# Patient Record
Sex: Male | Born: 1935 | Race: White | Hispanic: No | Marital: Married | Smoking: Former smoker
Health system: Southern US, Community
[De-identification: ages and names within clinical notes are randomized; demographics above are authoritative.]

## PROBLEM LIST (undated history)

## (undated) DIAGNOSIS — I714 Abdominal aortic aneurysm, without rupture, unspecified: Secondary | ICD-10-CM

## (undated) DIAGNOSIS — M1990 Unspecified osteoarthritis, unspecified site: Secondary | ICD-10-CM

## (undated) DIAGNOSIS — G43909 Migraine, unspecified, not intractable, without status migrainosus: Secondary | ICD-10-CM

## (undated) DIAGNOSIS — G629 Polyneuropathy, unspecified: Secondary | ICD-10-CM

## (undated) DIAGNOSIS — M353 Polymyalgia rheumatica: Secondary | ICD-10-CM

## (undated) DIAGNOSIS — K5792 Diverticulitis of intestine, part unspecified, without perforation or abscess without bleeding: Secondary | ICD-10-CM

## (undated) DIAGNOSIS — D509 Iron deficiency anemia, unspecified: Secondary | ICD-10-CM

## (undated) DIAGNOSIS — K922 Gastrointestinal hemorrhage, unspecified: Secondary | ICD-10-CM

## (undated) DIAGNOSIS — G459 Transient cerebral ischemic attack, unspecified: Secondary | ICD-10-CM

## (undated) DIAGNOSIS — K279 Peptic ulcer, site unspecified, unspecified as acute or chronic, without hemorrhage or perforation: Secondary | ICD-10-CM

## (undated) DIAGNOSIS — G8929 Other chronic pain: Secondary | ICD-10-CM

## (undated) DIAGNOSIS — E041 Nontoxic single thyroid nodule: Secondary | ICD-10-CM

## (undated) DIAGNOSIS — C61 Malignant neoplasm of prostate: Secondary | ICD-10-CM

## (undated) DIAGNOSIS — R55 Syncope and collapse: Secondary | ICD-10-CM

## (undated) DIAGNOSIS — I10 Essential (primary) hypertension: Secondary | ICD-10-CM

## (undated) DIAGNOSIS — F329 Major depressive disorder, single episode, unspecified: Secondary | ICD-10-CM

## (undated) DIAGNOSIS — R519 Headache, unspecified: Secondary | ICD-10-CM

## (undated) DIAGNOSIS — F32A Depression, unspecified: Secondary | ICD-10-CM

## (undated) DIAGNOSIS — I639 Cerebral infarction, unspecified: Secondary | ICD-10-CM

## (undated) DIAGNOSIS — E7800 Pure hypercholesterolemia, unspecified: Secondary | ICD-10-CM

## (undated) DIAGNOSIS — R51 Headache: Secondary | ICD-10-CM

## (undated) DIAGNOSIS — N189 Chronic kidney disease, unspecified: Secondary | ICD-10-CM

## (undated) DIAGNOSIS — E119 Type 2 diabetes mellitus without complications: Secondary | ICD-10-CM

## (undated) DIAGNOSIS — I951 Orthostatic hypotension: Secondary | ICD-10-CM

## (undated) HISTORY — PX: BACK SURGERY: SHX140

## (undated) HISTORY — DX: Chronic kidney disease, unspecified: N18.9

## (undated) HISTORY — PX: TONSILLECTOMY: SUR1361

## (undated) HISTORY — DX: Orthostatic hypotension: I95.1

## (undated) HISTORY — PX: HAND SURGERY: SHX662

## (undated) HISTORY — DX: Polyneuropathy, unspecified: G62.9

## (undated) HISTORY — PX: CRYOTHERAPY: SHX1416

## (undated) HISTORY — PX: TRIGGER FINGER RELEASE: SHX641

## (undated) HISTORY — PX: MENISCUS REPAIR: SHX5179

## (undated) HISTORY — PX: EYE SURGERY: SHX253

## (undated) HISTORY — DX: Essential (primary) hypertension: I10

## (undated) HISTORY — PX: HERNIA REPAIR: SHX51

## (undated) HISTORY — PX: KNEE DEBRIDEMENT: SHX1894

## (undated) HISTORY — PX: SPINAL FUSION: SHX223

## (undated) HISTORY — PX: SHOULDER OPEN ROTATOR CUFF REPAIR: SHX2407

## (undated) HISTORY — PX: APPENDECTOMY: SHX54

## (undated) HISTORY — DX: Type 2 diabetes mellitus without complications: E11.9

## (undated) HISTORY — PX: FEMUR SURGERY: SHX943

## (undated) HISTORY — PX: COLON SURGERY: SHX602

## (undated) HISTORY — DX: Polymyalgia rheumatica: M35.3

## (undated) HISTORY — DX: Migraine, unspecified, not intractable, without status migrainosus: G43.909

## (undated) HISTORY — PX: APPENDECTOMY (OPEN): SHX54

## (undated) HISTORY — DX: Unspecified osteoarthritis, unspecified site: M19.90

## (undated) HISTORY — PX: ABDOMINAL SURGERY: SHX537

## (undated) HISTORY — PX: FRACTURE SURGERY: SHX138

## (undated) HISTORY — DX: Abdominal aortic aneurysm, without rupture, unspecified: I71.40

## (undated) HISTORY — DX: Abdominal aortic aneurysm, without rupture: I71.4

## (undated) HISTORY — PX: ANTERIOR FUSION CERVICAL SPINE: SUR626

---

## 2002-12-01 HISTORY — PX: UVULECTOMY: SHX2631

## 2004-01-03 ENCOUNTER — Ambulatory Visit (HOSPITAL_COMMUNITY): Admission: RE | Admit: 2004-01-03 | Discharge: 2004-01-03 | Payer: Self-pay | Admitting: Internal Medicine

## 2006-09-29 ENCOUNTER — Ambulatory Visit: Payer: Self-pay | Admitting: Internal Medicine

## 2006-09-30 ENCOUNTER — Ambulatory Visit: Payer: Self-pay | Admitting: Internal Medicine

## 2011-05-31 DIAGNOSIS — R51 Headache: Secondary | ICD-10-CM | POA: Insufficient documentation

## 2011-05-31 DIAGNOSIS — E7800 Pure hypercholesterolemia, unspecified: Secondary | ICD-10-CM | POA: Insufficient documentation

## 2011-05-31 DIAGNOSIS — M545 Low back pain, unspecified: Secondary | ICD-10-CM | POA: Insufficient documentation

## 2011-05-31 DIAGNOSIS — N400 Enlarged prostate without lower urinary tract symptoms: Secondary | ICD-10-CM | POA: Insufficient documentation

## 2011-05-31 DIAGNOSIS — K273 Acute peptic ulcer, site unspecified, without hemorrhage or perforation: Secondary | ICD-10-CM | POA: Insufficient documentation

## 2011-05-31 DIAGNOSIS — R519 Headache, unspecified: Secondary | ICD-10-CM | POA: Insufficient documentation

## 2011-05-31 DIAGNOSIS — I10 Essential (primary) hypertension: Secondary | ICD-10-CM | POA: Insufficient documentation

## 2011-05-31 DIAGNOSIS — E119 Type 2 diabetes mellitus without complications: Secondary | ICD-10-CM | POA: Insufficient documentation

## 2011-05-31 DIAGNOSIS — D126 Benign neoplasm of colon, unspecified: Secondary | ICD-10-CM | POA: Insufficient documentation

## 2011-08-12 DIAGNOSIS — E291 Testicular hypofunction: Secondary | ICD-10-CM | POA: Insufficient documentation

## 2012-01-29 DIAGNOSIS — E042 Nontoxic multinodular goiter: Secondary | ICD-10-CM | POA: Insufficient documentation

## 2012-06-18 DIAGNOSIS — H9190 Unspecified hearing loss, unspecified ear: Secondary | ICD-10-CM | POA: Insufficient documentation

## 2012-06-18 DIAGNOSIS — K5792 Diverticulitis of intestine, part unspecified, without perforation or abscess without bleeding: Secondary | ICD-10-CM | POA: Insufficient documentation

## 2012-06-18 DIAGNOSIS — E079 Disorder of thyroid, unspecified: Secondary | ICD-10-CM | POA: Insufficient documentation

## 2012-06-18 DIAGNOSIS — K589 Irritable bowel syndrome without diarrhea: Secondary | ICD-10-CM | POA: Insufficient documentation

## 2012-06-18 DIAGNOSIS — K922 Gastrointestinal hemorrhage, unspecified: Secondary | ICD-10-CM

## 2012-06-18 DIAGNOSIS — M1990 Unspecified osteoarthritis, unspecified site: Secondary | ICD-10-CM | POA: Insufficient documentation

## 2012-06-18 DIAGNOSIS — F419 Anxiety disorder, unspecified: Secondary | ICD-10-CM | POA: Insufficient documentation

## 2012-06-18 DIAGNOSIS — F329 Major depressive disorder, single episode, unspecified: Secondary | ICD-10-CM | POA: Insufficient documentation

## 2012-06-18 DIAGNOSIS — F339 Major depressive disorder, recurrent, unspecified: Secondary | ICD-10-CM | POA: Insufficient documentation

## 2012-06-18 HISTORY — DX: Gastrointestinal hemorrhage, unspecified: K92.2

## 2012-11-09 DIAGNOSIS — R011 Cardiac murmur, unspecified: Secondary | ICD-10-CM | POA: Insufficient documentation

## 2012-11-09 DIAGNOSIS — R55 Syncope and collapse: Secondary | ICD-10-CM | POA: Insufficient documentation

## 2012-12-01 DIAGNOSIS — Z9289 Personal history of other medical treatment: Secondary | ICD-10-CM

## 2012-12-01 HISTORY — PX: ARTHROSCOPY KNEE, MENISCUS REPAIR: SHX51041

## 2012-12-01 HISTORY — DX: Personal history of other medical treatment: Z92.89

## 2013-01-11 DIAGNOSIS — R55 Syncope and collapse: Secondary | ICD-10-CM | POA: Insufficient documentation

## 2013-02-24 DIAGNOSIS — H269 Unspecified cataract: Secondary | ICD-10-CM | POA: Insufficient documentation

## 2013-02-24 DIAGNOSIS — Z961 Presence of intraocular lens: Secondary | ICD-10-CM | POA: Insufficient documentation

## 2013-03-21 ENCOUNTER — Ambulatory Visit (INDEPENDENT_AMBULATORY_CARE_PROVIDER_SITE_OTHER)
Admission: RE | Admit: 2013-03-21 | Disposition: A | Discharge status: Home / Self Care | Payer: Self-pay | Source: Ambulatory Visit | Attending: Family Medicine | Admitting: Family Medicine

## 2013-04-05 DIAGNOSIS — J069 Acute upper respiratory infection, unspecified: Secondary | ICD-10-CM | POA: Insufficient documentation

## 2013-05-13 ENCOUNTER — Emergency Department
Admission: EM | Admit: 2013-05-13 | Discharge: 2013-05-13 | Disposition: A | Discharge status: Home / Self Care | Payer: Medicare Other | Attending: Emergency Medicine | Admitting: Emergency Medicine

## 2013-05-13 ENCOUNTER — Emergency Department: Payer: Medicare Other

## 2013-05-13 LAB — CBC AND DIFFERENTIAL
Basophils %: 0.6 % (ref 0.0–3.0)
Basophils Absolute: 0 10*3/uL (ref 0.0–0.3)
Eosinophils %: 3.2 % (ref 0.0–7.0)
Eosinophils Absolute: 0.3 10*3/uL (ref 0.0–0.8)
Hematocrit: 41.1 % (ref 39.0–52.5)
Hemoglobin: 14.1 gm/dL (ref 13.0–17.5)
Lymphocytes Absolute: 1.6 10*3/uL (ref 0.6–5.1)
Lymphocytes: 20.4 % (ref 15.0–46.0)
MCH: 33 pg (ref 28–35)
MCHC: 34 gm/dL (ref 32–36)
MCV: 96 fL (ref 80–100)
MPV: 7.5 fL (ref 6.0–10.0)
Monocytes Absolute: 0.7 10*3/uL (ref 0.1–1.7)
Monocytes: 9.3 % (ref 3.0–15.0)
Neutrophils %: 66.6 % (ref 42.0–78.0)
Neutrophils Absolute: 5.3 10*3/uL (ref 1.7–8.6)
PLT CT: 249 10*3/uL (ref 130–440)
RBC: 4.27 10*6/uL (ref 4.00–5.70)
RDW: 11.5 % (ref 11.0–14.0)
WBC: 7.9 10*3/uL (ref 4.0–11.0)

## 2013-05-13 LAB — I-STAT CHEM 8 CARTRIDGE
Anion Gap I-Stat: 14 (ref 7–16)
BUN I-Stat: 12 mg/dL (ref 6–20)
Calcium Ionized I-Stat: 4.9 mg/dL (ref 4.35–5.10)
Chloride I-Stat: 102 mMol/L (ref 98–112)
Creatinine I-Stat: 1.1 mg/dL (ref 0.90–1.30)
EGFR: >60 mL/min/{1.73_m2}
Glucose I-Stat: 137 mg/dL — ABNORMAL HIGH (ref 70–99)
Hematocrit I-Stat: 39 % (ref 39.0–52.5)
Hemoglobin I-Stat: 13.3 gm/dL (ref 13.0–17.5)
Potassium I-Stat: 4.3 mMol/L (ref 3.5–5.3)
Sodium I-Stat: 141 mMol/L (ref 135–145)
TCO2 I-Stat: 31 mMol/L — ABNORMAL HIGH (ref 24–29)

## 2013-05-13 LAB — VH I-STAT CKMB: CKMB, ISTAT: 2.4 ng/mL (ref 0.2–6.0)

## 2013-05-13 LAB — I-STAT TROPONIN: Troponin I I-Stat: <0.02 ng/mL (ref 0.00–0.02)

## 2013-05-13 LAB — VH I-STAT CHEM 8 PLUS NOTIFICATION

## 2013-05-13 LAB — VH I-STAT BNP: BNP, ISTAT: 45 pg/mL (ref 0–100)

## 2013-05-13 LAB — VH I-STAT CKMB NOTIFICATION

## 2013-05-13 LAB — VH I-STAT TROPONIN NOTIFICATION

## 2013-05-13 LAB — VH I-STAT BNP NOTIFICATION - HLAB

## 2013-05-13 MED ORDER — ONDANSETRON HCL 4 MG/2ML IJ SOLN
4.0000 mg | Freq: Once | INTRAMUSCULAR | Status: AC
Start: 2013-05-13 — End: 2013-05-13
  Administered 2013-05-13: 4 mg via INTRAVENOUS

## 2013-05-13 MED ORDER — ONDANSETRON HCL 4 MG PO TABS
4.0000 mg | ORAL_TABLET | Freq: Four times a day (QID) | ORAL | Status: DC | PRN
Start: 2013-05-13 — End: 2014-07-04

## 2013-05-13 MED ORDER — VH HYDROMORPHONE HCL PF 1 MG/ML CARPUJECT
0.5000 mg | Freq: Once | INTRAMUSCULAR | Status: AC
Start: 2013-05-13 — End: 2013-05-13
  Administered 2013-05-13: 0.5 mg via INTRAVENOUS

## 2013-05-13 MED ORDER — HYDROCODONE-ACETAMINOPHEN 5-325 MG PO TABS
1.0000 | ORAL_TABLET | Freq: Four times a day (QID) | ORAL | Status: DC | PRN
Start: 2013-05-13 — End: 2014-07-04

## 2013-05-13 MED ORDER — IOHEXOL 240 MG/ML IJ SOLN
50.00 mL | Freq: Once | INTRAMUSCULAR | Status: AC
Start: 2013-05-13 — End: 2013-05-13
  Administered 2013-05-13: 50 mL via ORAL

## 2013-05-13 MED ORDER — VH HYDROMORPHONE HCL 1 MG/ML (NARRATOR)
INTRAMUSCULAR | Status: AC
Start: 2013-05-13 — End: ?
  Filled 2013-05-13: qty 1

## 2013-05-13 MED ORDER — ONDANSETRON HCL 4 MG/2ML IJ SOLN
INTRAMUSCULAR | Status: AC
Start: 2013-05-13 — End: ?
  Filled 2013-05-13: qty 2

## 2013-05-13 NOTE — ED Provider Notes (Addendum)
Physician/Midlevel provider first contact with patient: 05/13/13 1250         History     Chief Complaint   Patient presents with   . Chest Injury     Patient is a 79 y.o. male presenting with chest pain. The history is provided by the patient.   Chest Pain  The chest pain began 1 - 2 weeks ago. Chest pain occurs constantly. The chest pain is worsening. The pain is associated with breathing. The severity of the pain is mild. Chest pain is worsened by deep breathing. Pertinent negatives for primary symptoms include no cough, no wheezing, no palpitations, no nausea and no vomiting.   Associated symptoms include lower extremity edema.   Pertinent negatives for associated symptoms include no near-syncope.         Past Medical History   Diagnosis Date   . Neuropathy        Past Surgical History   Procedure Date   . Back surgery    . Abdominal surgery    . Appendectomy    . Fracture surgery    . Tonsillectomy    . Hernia repair        History reviewed. No pertinent family history.    Social  History   Substance Use Topics   . Smoking status: Never Smoker    . Smokeless tobacco: Never Used   . Alcohol Use: No       .     Allergies   Allergen Reactions   . Iodine        Current/Home Medications    DIAZEPAM (VALIUM) 5 MG TABLET    Take 5 mg by mouth every 12 (twelve) hours as needed.    ESCITALOPRAM (LEXAPRO) 5 MG TABLET    Take 5 mg by mouth nightly. Take 2 at night for anxiety    HYDROCODONE-ACETAMINOPHEN (NORCO) 5-325 MG PER TABLET    Take 1 tablet by mouth every 6 (six) hours as needed.    PREGABALIN (LYRICA) 75 MG CAPSULE    Take 75 mg by mouth 2 (two) times daily.        Review of Systems   Respiratory: Negative for cough and wheezing.    Cardiovascular: Positive for chest pain and leg swelling (feet swelling). Negative for palpitations and near-syncope.   Gastrointestinal: Negative for nausea and vomiting.       Physical Exam    BP 154/75  Pulse 87  Temp 98.8 F (37.1 C)  Resp 20  Ht 1.88 m  Wt 103.5 kg  BMI  29.28 kg/m2  SpO2 97%    Physical Exam   Nursing note and vitals reviewed.  Constitutional: He is oriented to person, place, and time. He appears well-developed and well-nourished. He appears distressed.   HENT:   Head: Normocephalic and atraumatic.   Mouth/Throat: Oropharynx is clear and moist.   Eyes: Conjunctivae normal are normal. Pupils are equal, round, and reactive to light. Right eye exhibits no discharge. Left eye exhibits no discharge.   Neck: Normal range of motion. Neck supple.   Cardiovascular: Normal rate, regular rhythm and normal heart sounds.    Pulmonary/Chest: Effort normal and breath sounds normal. No respiratory distress.   Abdominal: Soft. Bowel sounds are normal. He exhibits no distension. There is no tenderness.   Musculoskeletal: Normal range of motion. He exhibits no edema and no tenderness.   Neurological: He is alert and oriented to person, place, and time.   Skin: Skin is warm and  dry. No rash noted. He is not diaphoretic.   Psychiatric: He has a normal mood and affect.       MDM and ED Course     ED Medication Orders      Start     Status Ordering Provider    05/13/13 1448   iohexol (OMNIPAQUE) 240 MG/ML IV/ORAL solution 50 mL   Once in ED      Route: Oral  Ordered Dose: 50 mL         Last MAR action:  Given Gurney Balthazor J    05/13/13 1414   HYDROmorphone (DILAUDID) injection 0.5 mg   Once in ED      Route: Intravenous  Ordered Dose: 0.5 mg         Last MAR action:  Given Siyona Coto J    05/13/13 1414   ondansetron (ZOFRAN) injection 4 mg   Once in ED      Route: Intravenous  Ordered Dose: 4 mg         Last MAR action:  Given Ples Trudel J                 MDM    Chest wall pain, pneumonia, PE     Procedures    Clinical Impression & Disposition     Clinical Impression  Final diagnoses:   Chest wall pain        ED Disposition     Discharge Donato Heinz discharge to home/self care.    Condition at discharge: Good             New Prescriptions     HYDROCODONE-ACETAMINOPHEN (NORCO) 5-325 MG PER TABLET    Take 1 tablet by mouth every 6 (six) hours as needed for Pain.    ONDANSETRON (ZOFRAN) 4 MG TABLET    Take 1 tablet (4 mg total) by mouth every 6 (six) hours as needed for Nausea.               Donalynn Furlong, MD  05/13/13 1635    Donalynn Furlong, MD  05/26/13 Herminio Commons    Donalynn Furlong, MD  05/26/13 Paulo Fruit

## 2013-05-13 NOTE — Discharge Instructions (Signed)
Chest Pain, Noncardiac    Based on your visit today, the exact cause of your chest pain is not certain. Your condition does not seem serious and your pain does not appear to be coming from your heart. However, sometimes the signs of a serious problem take more time to appear. Therefore, please watch for the warning signs listed below.  Home Care:  1. Rest today and avoid strenuous activity.  2. Take any prescribed medicine as directed.  Follow Up  with your doctor or this facility as instructed or if you do not start to feel better within 24 hours.  Get Prompt Medical Attention  if any of the following occur:   A change in the type of pain: if it feels different, becomes more severe, lasts longer, or begins to spread into your shoulder, arm, neck, jaw or back   Shortness of breath or increased pain with breathing   Cough with dark colored sputum (phlegm) or blood   Weakness, dizziness, or fainting   Fever of 100.4F (38C) or higher, or as directed by your healthcare provider   Swelling, pain or redness in one leg   2000-2014 [employer], [address], [place] [address]. All rights reserved. This information is not intended as a substitute for professional medical care. Always follow your healthcare professional's instructions.

## 2013-05-13 NOTE — ED Notes (Signed)
Fall approximately 2 weeks prior to todays visit.  States positive loc for unknown time, fall from ladder 6-8 feet, states continues with left side pain with palpation and touch, ecchymosis noted and scattered over anterior and posterior trunk, bilateral lower extremities.

## 2013-05-17 ENCOUNTER — Emergency Department: Payer: Medicare Other

## 2013-05-17 ENCOUNTER — Emergency Department
Admission: EM | Admit: 2013-05-17 | Discharge: 2013-05-17 | Disposition: A | Discharge status: Home / Self Care | Payer: Medicare Other | Attending: Emergency Medicine | Admitting: Emergency Medicine

## 2013-05-17 DIAGNOSIS — M7989 Other specified soft tissue disorders: Secondary | ICD-10-CM | POA: Insufficient documentation

## 2013-05-17 LAB — ECG 12-LEAD
P Wave Axis: -73 deg
P Wave Duration: 94 ms
P-R Interval: 142 ms
Patient Age: 82 years
Q-T Dispersion: 58 ms
Q-T Interval(Corrected): 444 ms
Q-T Interval: 378 ms
QRS Axis: -55 deg
QRS Duration: 84 ms
T Axis: 9 deg
Ventricular Rate: 83 /min

## 2013-05-17 LAB — BASIC METABOLIC PANEL
Anion Gap: 14.2 mMol/L (ref 7.0–18.0)
BUN / Creatinine Ratio: 13.7 Ratio (ref 10.0–30.0)
BUN: 16 mg/dL (ref 7–22)
CO2: 28.3 mMol/L (ref 20.0–30.0)
Calcium: 9.1 mg/dL (ref 8.5–10.5)
Chloride: 103 mMol/L (ref 98–110)
Creatinine: 1.17 mg/dL (ref 0.80–1.30)
EGFR: 60 mL/min/{1.73_m2}
Glucose: 237 mg/dL — ABNORMAL HIGH (ref 70–99)
Osmolality Calc: 290 mOsm/kg (ref 275–300)
Potassium: 4.5 mMol/L (ref 3.5–5.3)
Sodium: 141 mMol/L (ref 136–147)

## 2013-05-17 LAB — URINALYSIS WITH CULTURE REFLEX
Bilirubin, UA: NEGATIVE mg/dL
Ketones UA: NEGATIVE mg/dL
Leukocyte Esterase, UA: NEGATIVE Leu/uL
Nitrite, UA: NEGATIVE
RBC, UA: 2 /hpf (ref 0–4)
Urine Specific Gravity: 1.022 (ref 1.001–1.040)
Urobilinogen, UA: NORMAL mg/dL
WBC, UA: 1 /hpf (ref 0–4)
pH, Urine: 6.5 pH (ref 5.0–8.0)

## 2013-05-17 LAB — HEPATIC FUNCTION PANEL
ALT: 20 U/L (ref 0–55)
AST (SGOT): 23 U/L (ref 10–42)
Albumin/Globulin Ratio: 0.89 Ratio (ref 0.70–1.50)
Albumin: 3.4 gm/dL — ABNORMAL LOW (ref 3.5–5.0)
Alkaline Phosphatase: 99 U/L (ref 40–145)
Bilirubin Direct: 0.1 mg/dL (ref 0.0–0.3)
Bilirubin, Total: 0.4 mg/dL (ref 0.1–1.2)
Globulin: 3.8 gm/dL (ref 2.0–4.0)
Protein, Total: 7.2 gm/dL (ref 6.0–8.3)

## 2013-05-17 LAB — CBC AND DIFFERENTIAL
Basophils %: 0.2 % (ref 0.0–3.0)
Basophils Absolute: 0 10*3/uL (ref 0.0–0.3)
Eosinophils %: 3.7 % (ref 0.0–7.0)
Eosinophils Absolute: 0.3 10*3/uL (ref 0.0–0.8)
Hematocrit: 42.3 % (ref 39.0–52.5)
Hemoglobin: 14.1 gm/dL (ref 13.0–17.5)
Lymphocytes Absolute: 1.4 10*3/uL (ref 0.6–5.1)
Lymphocytes: 19.4 % (ref 15.0–46.0)
MCH: 32 pg (ref 28–35)
MCHC: 33 gm/dL (ref 32–36)
MCV: 95 fL (ref 80–100)
MPV: 7.2 fL (ref 6.0–10.0)
Monocytes Absolute: 0.5 10*3/uL (ref 0.1–1.7)
Monocytes: 6.6 % (ref 3.0–15.0)
Neutrophils %: 70.2 % (ref 42.0–78.0)
Neutrophils Absolute: 5.1 10*3/uL (ref 1.7–8.6)
PLT CT: 277 10*3/uL (ref 130–440)
RBC: 4.48 10*6/uL (ref 4.00–5.70)
RDW: 11.6 % (ref 11.0–14.0)
WBC: 7.3 10*3/uL (ref 4.0–11.0)

## 2013-05-17 LAB — VH I-STAT BNP NOTIFICATION - HLAB

## 2013-05-17 LAB — VH I-STAT BNP: BNP, ISTAT: 71 pg/mL (ref 0–100)

## 2013-05-17 NOTE — ED Provider Notes (Signed)
Physician/Midlevel provider first contact with patient: 05/17/13 1603         History     Chief Complaint   Patient presents with   . Leg Swelling     HPI Comments: the patient complains of both feet swelling in the past 3-4 days. Left foot worse than right. the patient states that he fell and was seen here on 6/13. the patient denies re injuring his feet since fall.     Patient is a 79 y.o. male presenting with leg pain. The history is provided by the patient.   Leg Pain   Incident onset: 3-4 days ago  There was no injury mechanism. The pain is present in the left foot and right foot. The pain is mild. Associated symptoms comments: Swelling   .       Past Medical History   Diagnosis Date   . Neuropathy        Past Surgical History   Procedure Date   . Back surgery    . Abdominal surgery    . Appendectomy    . Fracture surgery    . Tonsillectomy    . Hernia repair        History reviewed. No pertinent family history.    Social  History   Substance Use Topics   . Smoking status: Never Smoker    . Smokeless tobacco: Never Used   . Alcohol Use: No       .     Allergies   Allergen Reactions   . Iodine        Current/Home Medications    DIAZEPAM (VALIUM) 5 MG TABLET    Take 5 mg by mouth every 12 (twelve) hours as needed.    ESCITALOPRAM (LEXAPRO) 5 MG TABLET    Take 5 mg by mouth nightly. Take 2 at night for anxiety    HYDROCODONE-ACETAMINOPHEN (NORCO) 5-325 MG PER TABLET    Take 1 tablet by mouth every 6 (six) hours as needed.    HYDROCODONE-ACETAMINOPHEN (NORCO) 5-325 MG PER TABLET    Take 1 tablet by mouth every 6 (six) hours as needed for Pain.    ONDANSETRON (ZOFRAN) 4 MG TABLET    Take 1 tablet (4 mg total) by mouth every 6 (six) hours as needed for Nausea.    PREGABALIN (LYRICA) 75 MG CAPSULE    Take 75 mg by mouth 2 (two) times daily.        Review of Systems   Constitutional: Negative for fever, chills and diaphoresis.   Skin: Negative.        Physical Exam    BP 152/64  Pulse 70  Temp 98.4 F (36.9 C)   Resp 18  Ht 1.88 m  Wt 103.9 kg  BMI 29.40 kg/m2  SpO2 96%    Physical Exam   Nursing note and vitals reviewed.  Constitutional: He is oriented to person, place, and time. He appears well-developed and well-nourished.   HENT:   Head: Normocephalic and atraumatic.   Mouth/Throat: Oropharynx is clear and moist.   Eyes: EOM are normal. Pupils are equal, round, and reactive to light.   Neck: Normal range of motion. Neck supple.   Cardiovascular: Normal rate.    Musculoskeletal: Normal range of motion. He exhibits edema and tenderness.        Left ankle: He exhibits swelling. tenderness.   Neurological: He is alert and oriented to person, place, and time.   Skin: Skin is warm and dry. Abrasion  noted.        Psychiatric: He has a normal mood and affect. His behavior is normal.       MDM and ED Course     ED Medication Orders     None           MDM  dvt leg swelling albumin new injury chf lytes    Procedures    Clinical Impression & Disposition     Clinical Impression  Final diagnoses:   Left leg swelling        ED Disposition     Discharge Donato Heinz discharge to home/self care.    Condition at discharge: Good             New Prescriptions    No medications on file               Edrick Kins., DO  05/17/13 2003

## 2013-08-04 DIAGNOSIS — I440 Atrioventricular block, first degree: Secondary | ICD-10-CM | POA: Insufficient documentation

## 2013-10-10 DIAGNOSIS — M25569 Pain in unspecified knee: Secondary | ICD-10-CM | POA: Insufficient documentation

## 2013-10-10 DIAGNOSIS — M25562 Pain in left knee: Secondary | ICD-10-CM | POA: Insufficient documentation

## 2014-03-15 DIAGNOSIS — M539 Dorsopathy, unspecified: Secondary | ICD-10-CM | POA: Insufficient documentation

## 2014-03-15 DIAGNOSIS — R29898 Other symptoms and signs involving the musculoskeletal system: Secondary | ICD-10-CM | POA: Insufficient documentation

## 2014-03-15 DIAGNOSIS — M25559 Pain in unspecified hip: Secondary | ICD-10-CM | POA: Insufficient documentation

## 2014-04-11 DIAGNOSIS — IMO0002 Reserved for concepts with insufficient information to code with codable children: Secondary | ICD-10-CM | POA: Insufficient documentation

## 2014-07-04 ENCOUNTER — Emergency Department: Payer: Medicare Other

## 2014-07-04 ENCOUNTER — Emergency Department
Admission: EM | Admit: 2014-07-04 | Discharge: 2014-07-04 | Disposition: A | Discharge status: Home / Self Care | Payer: Medicare Other | Attending: Emergency Medicine | Admitting: Emergency Medicine

## 2014-07-04 DIAGNOSIS — M25552 Pain in left hip: Secondary | ICD-10-CM

## 2014-07-04 DIAGNOSIS — W19XXXA Unspecified fall, initial encounter: Secondary | ICD-10-CM

## 2014-07-04 DIAGNOSIS — M25559 Pain in unspecified hip: Secondary | ICD-10-CM | POA: Insufficient documentation

## 2014-07-04 DIAGNOSIS — M2390 Unspecified internal derangement of unspecified knee: Secondary | ICD-10-CM

## 2014-07-04 DIAGNOSIS — M2392 Unspecified internal derangement of left knee: Secondary | ICD-10-CM

## 2014-07-04 MED ORDER — TRAMADOL HCL 50 MG PO TABS
50.0000 mg | ORAL_TABLET | Freq: Three times a day (TID) | ORAL | Status: DC | PRN
Start: 2014-07-04 — End: 2018-03-18

## 2014-07-04 NOTE — Discharge Instructions (Signed)
Mechanical Fall  You have had a fall today. It appears that the cause is "mechanical". That means that you slipped, tripped or lost your balance. If your fall had been due to fainting or a seizure, further tests would be required.  Home Care:  1. Rest today and resume your normal activities when you are feeling back to normal.  2. If you were injured during the fall, follow the advice from your doctor regarding care of your injury.  3. You may use acetaminophen (Tylenol) or ibuprofen (Motrin, Advil) to control pain, unless another pain medicine was prescribed. [NOTE: If you have chronic liver or kidney disease or ever had a stomach ulcer or GI bleeding, talk with your doctor before using these medicines.]  Fall Prevention:   Was there anything that caused your fall that can be fixed, removed, or replaced?   Make your home safe by keeping walkways clear of objects you may trip over.   Use non-slip pads under rugs.   Do not walk in poorly lit areas.   Do not stand on chairs or wobbly ladders.   Use caution when reaching overhead or looking upward. This position can cause a loss of balance.   Be sure your shoes fit properly, have non-slip bottoms and are in good condition.   Be cautious when going up and down curbs, and walking on uneven sidewalks.   If your balance is poor, consider using a cane or walker.   Stay as active as you can. Balance, flexibility, strength, and endurance all come from exercise. They all play a role in preventing falls.  Follow Up  with your doctor or as advised by our staff.  Get Prompt Medical Attention  if any of the following occur:   Repeated mechanical falls, or unexplained falls   Dizziness, fainting or seizure   Severe headache   Chest pain or shortness of breath   Palpitations (very rapid or very slow or irregular heartbeat)   Blood in vomit, stools (black or red color)   Weakness of an arm or leg or one side of the face   Difficulty with speech or vision    2000-2014 [employer] [address], [place] [address]. All rights reserved. This information is not intended as a substitute for professional medical care. Always follow your healthcare professional's instructions.          Hip Contusion    You have a contusion of the hip. This is a bruise with swelling and some bleeding under the skin. There are no fracture (broken bone) seen on the x-ray. This injury takes a few days to a few weeks to heal.  Home Care  4. If walking causes pain, use crutches or a walker until you can walk without pain. These items can be rented at most pharmacies and orthopedic supply stores.  5. Apply an ice pack (ice cubes in a plastic bag, wrapped in a towel) over the injured area for 20 minutes every 1-2 hours the firstday. You should continue with ice packs 3-4 times a day for the next two days. Continue the use of ice packs for relief of pain and swelling as needed.  6. You may use acetaminophen (Tylenol) or ibuprofen (Motrin, Advil) to control pain, unless another pain medicine was prescribed. [NOTE: If you have chronic liver or kidney disease or ever had a stomach ulcer or GI bleeding, talk with your doctor before using these medicines.]  7. If you are not   able to get to the bathroom easily or take care of your meal preparation, home health care may be available to provide in-home nursing services. Check with your doctor, the hospital's social service department or through private nursing agencies to see if your insurance will cover this kind of care.  Follow Up  Follow up with your doctor or as advised by our staff if your symptoms do not begin to improve after one week. A repeat x-ray or a CT-scan may be needed to check again for a fracture.  [NOTE: If X-rays were taken, they will be reviewed by a radiologist. You will be notified of any new findings that may affect your care.]  Get Prompt Medical Attention  Get prompt medical attention if any of the following  occur:   Increased swelling or increased bruising   Pain becomes worse   Decreased ability to bear weight on the injured side   Swelling or pain below your knee   Chest pain or shortness of breath   2000-2014 [employer] [address], [place] [address]. All rights reserved. This information is not intended as a substitute for professional medical care. Always follow your healthcare professional's instructions.

## 2014-07-04 NOTE — ED Provider Notes (Signed)
[facility]  EMERGENCY DEPARTMENT  History and Physical Exam       Patient Name: Gregory Mcdaniel, Gregory Mcdaniel  Encounter Date:  07/04/2014  Treating Provider: Arlice Colt, PA-C  Supervising Physician: Tori Milks, MD  PCP: Historical, Conversion (General)  Patient DOB:  16-Mar-1930  MRN:  [id number]  Room:  E55/E55-A      History of Presenting Illness     Chief complaint: Fall      Gregory Mcdaniel is a 80 y.o. male who presents with left knee and hip pain after a fall 4 days ago. He states that he has chronic neuropathy in both feet and sometimes trips on things. He tripped on a root in his garden at night. Denies any headache, dizziness or LOC. He does have a history of a meniscal injury years ago in the left knee. States that occasionally it makes clicking sounds and gives way.         Review of Systems   Review of Systems   Constitutional: Negative.  Negative for fever and chills.   HENT: Negative.    Eyes: Negative.  Negative for blurred vision.   Respiratory: Negative.  Negative for cough and shortness of breath.    Cardiovascular: Negative.  Negative for chest pain.   Gastrointestinal: Negative.  Negative for abdominal pain.   Genitourinary: Negative.    Musculoskeletal: Positive for joint pain and falls. Negative for myalgias.   Skin: Negative.  Negative for rash.   Neurological: Negative.  Negative for dizziness, sensory change and headaches.   Endo/Heme/Allergies: Negative.    Psychiatric/Behavioral: Negative.    All other systems reviewed and are negative.           Allergies & Medications     Pt is allergic to iodine.    Current/Home Medications    DIAZEPAM (VALIUM) 5 MG TABLET    Take 5 mg by mouth every 12 (twelve) hours as needed.    ESCITALOPRAM (LEXAPRO) 5 MG TABLET    Take 5 mg by mouth nightly. Take 2 at night for anxiety    PREGABALIN (LYRICA) 75 MG CAPSULE    Take 75 mg by mouth 2 (two) times daily.        Past Medical History     Pt has a past medical history of Neuropathy.     Past  Surgical History     Pt  has past surgical history that includes Back surgery; Abdominal surgery; Appendectomy; Fracture surgery; Tonsillectomy; and Hernia repair.     Family History     The family history is not on file.     Social History     Pt reports that he has never smoked. He has never used smokeless tobacco. He reports that he does not drink alcohol or use illicit drugs.     Physical Exam     Blood pressure 163/69, pulse 86, temperature 98 F (36.7 C), temperature source Oral, resp. rate 16, height 1.88 m, weight 97.07 kg, SpO2 95 %.      Constitutional: Well developed, well nourished, active, in no apparent distress.  HEENT:  NCAT.  No oropharyngeal lesions, moist mucous membranes.    Eyes: PERRL. EOMI.  No conjunctivitis or discharge.  Visual acuity grossly intact.  Cervical AROM WNL.   Pulmonary/Chest: Effort normal without accessory muscle use  Musculoskeletal: Left knee medial joint line tenderness palpation. Positive anterior drawer. Positive McMurray. No palpable effusion. AROM WNL.   There is a small abrasion in the left inguinal  region, which is TTP.   Abdomen:  Soft, non-tender, non-distended. No palpable masses or hernias. No hepatosplenomegaly.  Neurological: Pt is alert. CN II-XII grossly intact. Moving all extremities without apparent deficit.   Psychiatric: Affect is appropriate. There is no agitation.   Skin: Skin is warm and dry.  No cyanosis, jaundice or pallor. No rash or lesions noted.       Diagnostic Results     The results of the diagnostic studies below have been reviewed by myself:    Labs  Results    ** No results found for the last 24 hours. **          Radiologic Studies  Xr Pelvis Complete    07/04/2014   No acute abnormality.  ReadingStation:[id number]           ED Course and Medical Decision Making     ED Medication Orders    None        This patient presents to the Emergency Department following a fall or traumatic event.   Evaluation, examination and treatment for this patient  was performed and revealed that there were no serious injuries identified in the ED.  In addition this patient seems to be very low risk for any delayed serious injury. Many sequelae of their event were considered in the differential diagnosis including fracture, closed head injury, contusion, abrasion, laceration and organ injury. Any serious sequelae that would require admission were thought unlikely. The patient was felt to be stable for discharge home.  The diagnostic impression and plan and appropriate follow-up were discussed and agreed upon with the patient and/or family.  If performed the results of lab/radiology tests were reviewed and discussed with the patient and/or family. All questions were answered and concerns addressed.  MVA precautions have been given and the patient was warned to return immediately for worsening symptoms or any acute concerns.        In addition to the above history, please see nursing notes. Allergies, meds, past medical, family, social hx, and the results of the diagnostic studies performed have been reviewed by myself.  This chart was generated by an EMR and may contain errors or omissions not intended by the user.     Procedures / Critical Care     None     Diagnosis / Disposition     Clinical Impression  1. Fall, initial encounter    2. Internal derangement of knee, left    3. Hip pain, left        Disposition  ED Disposition    Discharge Gregory Mcdaniel discharge to home/self care.    Condition at disposition: Stable              Follow up for Discharged Patients  [facility]  [address]  [place] [address]  [phone]        [facility] Emergency [facility]  [address]  [place] [address]  [phone]    As needed      Prescriptions for Discharged Patients  New Prescriptions    TRAMADOL (ULTRAM) 50 MG TABLET    Take 1-2 tablets (50-100 mg total) by mouth every 8 (eight) hours as needed for Pain.                Lynnae Prude,  [place]  07/04/14 1411    Tori Milks, MD  07/05/14 [phone]

## 2014-07-04 NOTE — ED Notes (Signed)
Patient complaining of left sided groin pain, and also left foot pain.

## 2014-07-06 ENCOUNTER — Other Ambulatory Visit: Payer: Self-pay | Admitting: Orthopaedic Surgery

## 2014-07-06 ENCOUNTER — Ambulatory Visit
Admission: RE | Admit: 2014-07-06 | Discharge: 2014-07-06 | Disposition: A | Discharge status: Home / Self Care | Payer: Medicare Other | Source: Ambulatory Visit | Attending: Orthopaedic Surgery | Admitting: Orthopaedic Surgery

## 2014-07-06 DIAGNOSIS — M25459 Effusion, unspecified hip: Secondary | ICD-10-CM | POA: Insufficient documentation

## 2014-07-06 DIAGNOSIS — X58XXXA Exposure to other specified factors, initial encounter: Secondary | ICD-10-CM | POA: Insufficient documentation

## 2014-07-06 DIAGNOSIS — M25552 Pain in left hip: Secondary | ICD-10-CM

## 2014-07-06 DIAGNOSIS — IMO0002 Reserved for concepts with insufficient information to code with codable children: Secondary | ICD-10-CM | POA: Insufficient documentation

## 2014-07-21 ENCOUNTER — Emergency Department: Payer: Medicare Other

## 2014-07-21 ENCOUNTER — Emergency Department
Admission: EM | Admit: 2014-07-21 | Discharge: 2014-07-21 | Disposition: A | Discharge status: Home / Self Care | Payer: Medicare Other | Attending: Emergency Medicine | Admitting: Emergency Medicine

## 2014-07-21 DIAGNOSIS — R5381 Other malaise: Secondary | ICD-10-CM | POA: Insufficient documentation

## 2014-07-21 DIAGNOSIS — W19XXXA Unspecified fall, initial encounter: Secondary | ICD-10-CM | POA: Insufficient documentation

## 2014-07-21 DIAGNOSIS — R5383 Other fatigue: Secondary | ICD-10-CM

## 2014-07-21 DIAGNOSIS — G319 Degenerative disease of nervous system, unspecified: Secondary | ICD-10-CM | POA: Insufficient documentation

## 2014-07-21 DIAGNOSIS — S7002XA Contusion of left hip, initial encounter: Secondary | ICD-10-CM

## 2014-07-21 DIAGNOSIS — S7000XA Contusion of unspecified hip, initial encounter: Secondary | ICD-10-CM | POA: Insufficient documentation

## 2014-07-21 DIAGNOSIS — R739 Hyperglycemia, unspecified: Secondary | ICD-10-CM

## 2014-07-21 DIAGNOSIS — R7309 Other abnormal glucose: Secondary | ICD-10-CM | POA: Insufficient documentation

## 2014-07-21 LAB — COMPREHENSIVE METABOLIC PANEL
ALT: 21 U/L (ref 0–55)
AST (SGOT): 16 U/L (ref 10–42)
Albumin/Globulin Ratio: 0.63 Ratio — ABNORMAL LOW (ref 0.70–1.50)
Albumin: 2.6 gm/dL — ABNORMAL LOW (ref 3.5–5.0)
Alkaline Phosphatase: 64 U/L (ref 40–145)
Anion Gap: 12.8 mMol/L (ref 7.0–18.0)
BUN / Creatinine Ratio: 13.2 Ratio (ref 10.0–30.0)
BUN: 14 mg/dL (ref 7–22)
Bilirubin, Total: 0.3 mg/dL (ref 0.1–1.2)
CO2: 28.7 mMol/L (ref 20.0–30.0)
Calcium: 8.8 mg/dL (ref 8.5–10.5)
Chloride: 100 mMol/L (ref 98–110)
Creatinine: 1.06 mg/dL (ref 0.80–1.30)
EGFR: >60 mL/min/{1.73_m2}
Globulin: 4.1 gm/dL — ABNORMAL HIGH (ref 2.0–4.0)
Glucose: 255 mg/dL — ABNORMAL HIGH (ref 70–99)
Osmolality Calc: 283 mOsm/kg (ref 275–300)
Potassium: 4.5 mMol/L (ref 3.5–5.3)
Protein, Total: 6.7 gm/dL (ref 6.0–8.3)
Sodium: 137 mMol/L (ref 136–147)

## 2014-07-21 LAB — CBC AND DIFFERENTIAL
Basophils %: 0.3 % (ref 0.0–3.0)
Basophils Absolute: 0 10*3/uL (ref 0.0–0.3)
Eosinophils %: 1.1 % (ref 0.0–7.0)
Eosinophils Absolute: 0.1 10*3/uL (ref 0.0–0.8)
Hematocrit: 37.2 % — ABNORMAL LOW (ref 39.0–52.5)
Hemoglobin: 12.8 gm/dL — ABNORMAL LOW (ref 13.0–17.5)
Lymphocytes Absolute: 1.3 10*3/uL (ref 0.6–5.1)
Lymphocytes: 11.6 % — ABNORMAL LOW (ref 15.0–46.0)
MCH: 32 pg (ref 28–35)
MCHC: 34 gm/dL (ref 32–36)
MCV: 94 fL (ref 80–100)
MPV: 6.9 fL (ref 6.0–10.0)
Monocytes Absolute: 1.2 10*3/uL (ref 0.1–1.7)
Monocytes: 10.7 % (ref 3.0–15.0)
Neutrophils %: 76.4 % (ref 42.0–78.0)
Neutrophils Absolute: 8.5 10*3/uL (ref 1.7–8.6)
PLT CT: 340 10*3/uL (ref 130–440)
RBC: 3.96 10*6/uL — ABNORMAL LOW (ref 4.00–5.70)
RDW: 11.7 % (ref 11.0–14.0)
WBC: 11.2 10*3/uL — ABNORMAL HIGH (ref 4.0–11.0)

## 2014-07-21 LAB — URINALYSIS WITH CULTURE REFLEX
Bilirubin, UA: NEGATIVE
Glucose, UA: 150 mg/dL — AB
Ketones UA: NEGATIVE mg/dL
Leukocyte Esterase, UA: NEGATIVE Leu/uL
Nitrite, UA: NEGATIVE
Protein, UR: NEGATIVE mg/dL
RBC, UA: 4 /hpf (ref 0–4)
Urine Specific Gravity: 1.017 (ref 1.001–1.040)
Urobilinogen, UA: NORMAL mg/dL
WBC, UA: 1 /hpf (ref 0–4)
pH, Urine: 6 pH (ref 5.0–8.0)

## 2014-07-21 LAB — C-REACTIVE PROTEIN: C-Reactive Protein: 11.47 mg/dL — ABNORMAL HIGH (ref 0.02–0.80)

## 2014-07-21 MED ORDER — METFORMIN HCL 500 MG PO TABS
500.0000 mg | ORAL_TABLET | Freq: Two times a day (BID) | ORAL | Status: AC
Start: 2014-07-21 — End: ?

## 2014-07-21 NOTE — ED Notes (Signed)
ERMD Pianalto at bedside discussing results.

## 2014-07-21 NOTE — ED Notes (Signed)
Pt unable to urinate at this time. Will try again later, pt aware that we need a sample.

## 2014-07-21 NOTE — ED Provider Notes (Signed)
Physician/Midlevel provider first contact with patient: 07/21/14 1114           [facility] EMERGENCY DEPARTMENT  History and Physical Exam          Patient: Gregory Mcdaniel  Encounter Date: 07/21/2014    DOB: Jul 18, 1930  Age/Sex: 80 y.o. male    MRN: [id number]  Room: W41/W41-A    PCP: Christa See, MD  Attending: Magdalene Molly, MD        H&P ROS       Oluwafemi Villella Morones is a 80 y.o. male who presents with chief complaint of left hip injury. Pt had a fall this morning and he believes came down on his left hip. The fall was more of a tumble and not a boom. Pt has been in physical therapy for his left hip which includes some edema in the labrum tear. Pt has a hx of peripheral neuropathy. Pt has been having more frequent falls. Following the fall this morning pt was able to get up off the floor with some assistance and weight bear on both legs.       HPI/ROS limits: none  Hx given by: patient and family    Review of Systems   Constitutional: Negative for fever.   Respiratory: Negative for cough and shortness of breath.    Cardiovascular: Negative for chest pain.   Gastrointestinal: Negative for nausea, vomiting, abdominal pain and diarrhea.   Genitourinary: Negative for dysuria.   Musculoskeletal: Positive for falls.   Skin: Negative for rash.   Neurological: Negative for headaches.   All other systems reviewed and are negative.           ALL / MEDS / PMH / PSH / PFH / SH     Pt is allergic to iodine.    Previous Medications    DIAZEPAM (VALIUM) 5 MG TABLET    Take 5 mg by mouth every 12 (twelve) hours as needed.    ESCITALOPRAM (LEXAPRO) 5 MG TABLET    Take 5 mg by mouth nightly. Take 2 at night for anxiety    HYDROCODONE-ACETAMINOPHEN (NORCO) 5-325 MG PER TABLET    Take 1 tablet by mouth every 6 (six) hours as needed for Pain.    PREGABALIN (LYRICA) 75 MG CAPSULE    Take 75 mg by mouth 2 (two) times daily.    TRAMADOL (ULTRAM) 50 MG TABLET    Take 1-2 tablets (50-100 mg total) by mouth every 8 (eight) hours as  needed for Pain.    VENLAFAXINE (EFFEXOR) 37.5 MG TABLET    Take 37.5 mg by mouth 2 (two) times daily.       Pt has a past medical history of Neuropathy; DJD (degenerative joint disease); Migraine; AAA (abdominal aortic aneurysm); and History of CT scan of head (2014).    Pt has past surgical history that includes Abdominal surgery; Appendectomy; Fracture surgery; Tonsillectomy; Hernia repair; ARTHROSCOPY KNEE, MENISCUS REPAIR (2014); Back surgery; and Anterior fusion cervical spine.    The family history is not on file.    Pt reports that he has never smoked. He has never used smokeless tobacco. He reports that he does not drink alcohol or use illicit drugs.         Physical Exam       Constitutional: Patient appears comfortable.   Eyes: Pupils equal. EOMI.   ENMT: NL appearance of ears/nose. MMM.   Respiratory: Lungs are clear bilaterally. No work of breathing.   Cardiovascular: Heart regular rate  and rhythm. No leg edema bilaterally.   GI/GU: Abdomen is soft, nontender; no guarding or rigidity.   Skin: Skin is warm and dry.   Neurologic: Awake and alert. Memory intact.   Psychiatric: Normal affect and insight; no agitation.   Musculoskeletal: Neck is supple. Trachea is midline. Some pain with left hip abduction.  TTP on lateral hip.     Blood pressure 117/59, pulse 71, temperature 98.8 F (37.1 C), resp. rate 16, height 1.88 m, weight 97.6 kg, SpO2 97 %.         Labs and Imaging     Labs  Labs Reviewed   CBC AND DIFFERENTIAL - Abnormal; Notable for the following:     WBC 11.2 (*)     RBC 3.96 (*)     Hemoglobin 12.8 (*)     Hematocrit 37.2 (*)     Lymphocytes 11.6 (*)     All other components within normal limits   COMPREHENSIVE METABOLIC PANEL - Abnormal; Notable for the following:     Glucose 255 (*)     Albumin 2.6 (*)     Albumin/Globulin Ratio 0.63 (*)     Globulin 4.1 (*)     All other components within normal limits   C-REACTIVE PROTEIN - Abnormal; Notable for the following:     C-Reactive Protein 11.47  (*)     All other components within normal limits   URINALYSIS WITH CULTURE REFLEX - Abnormal; Notable for the following:     Glucose, UA 150 (*)     Blood, UA Small (*)     Bacteria, UA Rare (*)     Hyaline Casts, UA 0-2 (*)     All other components within normal limits       Radiologic Studies  Xr Hip Left    07/21/2014   1.  Mild hypertrophic and degenerative changes. 2.  No acute skeletal abnormality. 3.  Postoperative changes lower lumbar fusion.  ReadingStation:[id number]    Ct Head Wo (rad)    07/21/2014   Negative head CT.  ReadingStation:[id number]        ED Medication Orders     None          EKG: none         Procedures & Critical Care / MDM     No procedures or critical care.    The differential diagnosis includes, but is not limited to FRACTURE, SPRAIN, STRAIN, CONTUSION, COMPARTMENT SYNDROME    11:17 AM  Will start with x-ray of the hip.      3:21 PM  No evidence of fracture on x-ray. As far as weakness goes, etiology is likely multifactorial. Family is asking for a PCP to follow up with here since the pt will be residing here mostly rather than [place]. I will also suggest neurology to follow-up with for his peripheral neuropathy as well as some indications of dementia.    Labs / imaging were reviewed and explained to the patient. All questions have been answered. Pt is appreciative of care.         Diagnosis       Final diagnoses:   Contusion of left hip, initial encounter   Other fatigue   Falls, initial encounter   Hyperglycemia         ED Disposition     Discharge Donato Heinz discharge to home/self care.    Condition at disposition: Stable  New Prescriptions    METFORMIN (GLUCOPHAGE) 500 MG TABLET    Take 1 tablet (500 mg total) by mouth 2 (two) times daily with meals.       In addition to the above history, please see nursing notes.  Allergies, meds, past medical, family, social hx, and the results of the diagnostic studies performed have been reviewed.  This is note has been  created using an EMR that may contain additions or subtractions not intended by myself, Magdalene Molly, MD.                 Magdalene Molly, MD  07/21/14 [phone]

## 2014-07-21 NOTE — ED Notes (Signed)
Pt' states left groin pain for a few days and left hip pain onset this am after fall from sitting position (was sitting in a chair.)> Pt able to get off the floor with assistance and weight bear BLE. Pt denies N/V/D; denies chest pain or SOB. Pt's daughter going into lengthy detail re: pt's PMH and orthopedic history. Pt resting in supine position on stretcher with HOB up 45 degrees. Bed in low and locked position, rails up X2, callbell in reach. ERMD Painalto at bedside for pt evaluation.

## 2014-07-21 NOTE — ED Notes (Signed)
Pt provided with po fluids and sandwich per request of something to eat. Pt's daughter again states concerns re: pt has "lapse in his memory and usually he's sharp as a tack." Daughter advised ERMD will be informed of her concerns.

## 2014-07-21 NOTE — ED Notes (Signed)
Pt sleeping quietly as nurse entered room, awake to verbal stimuli. Pt's daughter states concerns that pt "has said he feels out of it and sleeping a lot." pt denies blow to head with fall. Also states "I haven't eaten today>" Pt requesting po fluids to facilitate urine output.

## 2014-07-21 NOTE — Discharge Instructions (Signed)
Fall Due ToDizziness, Weakness, Or Loss Of Balance  The symptoms that led to your fall have been evaluated. Your doctor feels it is safe for you to return home. However, you may be at risk of repeat falls. Take precautions described below to prevent another fall.  Home Care:   Rest today. When changing position from lying to sitting or standing, take a moment to be sure any dizziness goes away before standing and walking.   If you have been prescribed a walker, be sure to use it whenever you walk, even if it is a short distance.   If you were injured during the fall, follow the advice from your doctor regarding care of your injury.   If you become lightheaded or dizzy, lie down immediately or sit and lean forward with your head down.   Be sure your doctor knows all the medications, herbs, and supplements you take. Some medications can cause dizziness.  Follow Up:  Unless given other advice, call your doctor on the next office day to advise of your fall and to schedule an appointment. You may require further treatment forthe underlying condition that caused today's fall.  Get Prompt Medical Attention  if any of the following occur:   Another fall   Continued dizzy spells   Severe headache, fainting, or a seizure   Chest pain or shortness of breath   Palpitations (very rapid or very slow or irregular heartbeat)   Blood in vomit or stools (black or red color)   Weakness of an arm or leg or one side of the face   Difficulty with speech or vision   Unusual drowsiness, confusion, or change in behavior   2000-2014 [employer] [address], [place] [address]. All rights reserved. This information is not intended as a substitute for professional medical care. Always follow your healthcare professional's instructions.

## 2014-07-21 NOTE — ED Notes (Signed)
Pt assisted to standing position at bedside for urinal use; voided 50 ml deep yellow color urine. Pt ambulated to sink to wash his hands and returned to bedside all with assistance. Weight bearing and ambulating without difficulty--no gait ataxia. UA collected and to lab.

## 2014-08-16 ENCOUNTER — Emergency Department (HOSPITAL_COMMUNITY): Payer: Medicare Other

## 2014-08-16 ENCOUNTER — Inpatient Hospital Stay (HOSPITAL_COMMUNITY)
Admission: EM | Admit: 2014-08-16 | Discharge: 2014-08-18 | DRG: 871 | Disposition: A | Discharge status: Home Health | Payer: Medicare Other | Attending: Internal Medicine | Admitting: Internal Medicine

## 2014-08-16 ENCOUNTER — Encounter (HOSPITAL_COMMUNITY): Payer: Self-pay | Admitting: Emergency Medicine

## 2014-08-16 ENCOUNTER — Inpatient Hospital Stay (HOSPITAL_COMMUNITY): Payer: Medicare Other

## 2014-08-16 DIAGNOSIS — A419 Sepsis, unspecified organism: Principal | ICD-10-CM | POA: Diagnosis present

## 2014-08-16 DIAGNOSIS — D649 Anemia, unspecified: Secondary | ICD-10-CM

## 2014-08-16 DIAGNOSIS — D75839 Thrombocytosis, unspecified: Secondary | ICD-10-CM

## 2014-08-16 DIAGNOSIS — M1990 Unspecified osteoarthritis, unspecified site: Secondary | ICD-10-CM | POA: Diagnosis present

## 2014-08-16 DIAGNOSIS — Z9181 History of falling: Secondary | ICD-10-CM | POA: Diagnosis not present

## 2014-08-16 DIAGNOSIS — R509 Fever, unspecified: Secondary | ICD-10-CM | POA: Diagnosis present

## 2014-08-16 DIAGNOSIS — R5381 Other malaise: Secondary | ICD-10-CM | POA: Diagnosis not present

## 2014-08-16 DIAGNOSIS — D500 Iron deficiency anemia secondary to blood loss (chronic): Secondary | ICD-10-CM | POA: Diagnosis present

## 2014-08-16 DIAGNOSIS — J9601 Acute respiratory failure with hypoxia: Secondary | ICD-10-CM

## 2014-08-16 DIAGNOSIS — R531 Weakness: Secondary | ICD-10-CM

## 2014-08-16 DIAGNOSIS — D509 Iron deficiency anemia, unspecified: Secondary | ICD-10-CM

## 2014-08-16 DIAGNOSIS — E119 Type 2 diabetes mellitus without complications: Secondary | ICD-10-CM | POA: Diagnosis present

## 2014-08-16 DIAGNOSIS — R6510 Systemic inflammatory response syndrome (SIRS) of non-infectious origin without acute organ dysfunction: Secondary | ICD-10-CM | POA: Diagnosis present

## 2014-08-16 DIAGNOSIS — G9340 Encephalopathy, unspecified: Secondary | ICD-10-CM | POA: Diagnosis present

## 2014-08-16 DIAGNOSIS — E7800 Pure hypercholesterolemia, unspecified: Secondary | ICD-10-CM | POA: Diagnosis present

## 2014-08-16 DIAGNOSIS — D72829 Elevated white blood cell count, unspecified: Secondary | ICD-10-CM | POA: Diagnosis present

## 2014-08-16 DIAGNOSIS — R310 Gross hematuria: Secondary | ICD-10-CM | POA: Diagnosis present

## 2014-08-16 DIAGNOSIS — Z8711 Personal history of peptic ulcer disease: Secondary | ICD-10-CM

## 2014-08-16 DIAGNOSIS — J9600 Acute respiratory failure, unspecified whether with hypoxia or hypercapnia: Secondary | ICD-10-CM | POA: Diagnosis present

## 2014-08-16 DIAGNOSIS — E440 Moderate protein-calorie malnutrition: Secondary | ICD-10-CM | POA: Diagnosis present

## 2014-08-16 DIAGNOSIS — F329 Major depressive disorder, single episode, unspecified: Secondary | ICD-10-CM | POA: Diagnosis present

## 2014-08-16 DIAGNOSIS — N390 Urinary tract infection, site not specified: Secondary | ICD-10-CM | POA: Diagnosis present

## 2014-08-16 DIAGNOSIS — E785 Hyperlipidemia, unspecified: Secondary | ICD-10-CM | POA: Diagnosis present

## 2014-08-16 DIAGNOSIS — D473 Essential (hemorrhagic) thrombocythemia: Secondary | ICD-10-CM | POA: Diagnosis present

## 2014-08-16 DIAGNOSIS — E871 Hypo-osmolality and hyponatremia: Secondary | ICD-10-CM | POA: Diagnosis present

## 2014-08-16 DIAGNOSIS — Z87891 Personal history of nicotine dependence: Secondary | ICD-10-CM | POA: Diagnosis not present

## 2014-08-16 DIAGNOSIS — R6520 Severe sepsis without septic shock: Secondary | ICD-10-CM

## 2014-08-16 DIAGNOSIS — I10 Essential (primary) hypertension: Secondary | ICD-10-CM | POA: Diagnosis present

## 2014-08-16 DIAGNOSIS — F3289 Other specified depressive episodes: Secondary | ICD-10-CM | POA: Diagnosis present

## 2014-08-16 DIAGNOSIS — Z85038 Personal history of other malignant neoplasm of large intestine: Secondary | ICD-10-CM | POA: Diagnosis not present

## 2014-08-16 DIAGNOSIS — E860 Dehydration: Secondary | ICD-10-CM | POA: Diagnosis present

## 2014-08-16 DIAGNOSIS — R296 Repeated falls: Secondary | ICD-10-CM

## 2014-08-16 HISTORY — DX: Other chronic pain: G89.29

## 2014-08-16 HISTORY — DX: Syncope and collapse: R55

## 2014-08-16 HISTORY — DX: Gastrointestinal hemorrhage, unspecified: K92.2

## 2014-08-16 HISTORY — DX: Peptic ulcer, site unspecified, unspecified as acute or chronic, without hemorrhage or perforation: K27.9

## 2014-08-16 HISTORY — DX: Headache, unspecified: R51.9

## 2014-08-16 HISTORY — DX: Iron deficiency anemia, unspecified: D50.9

## 2014-08-16 HISTORY — DX: Headache: R51

## 2014-08-16 HISTORY — DX: Pure hypercholesterolemia, unspecified: E78.00

## 2014-08-16 HISTORY — DX: Diverticulitis of intestine, part unspecified, without perforation or abscess without bleeding: K57.92

## 2014-08-16 LAB — GLUCOSE, CAPILLARY
GLUCOSE-CAPILLARY: 182 mg/dL — AB (ref 70–99)
Glucose-Capillary: 233 mg/dL — ABNORMAL HIGH (ref 70–99)
Glucose-Capillary: 153 mg/dL — ABNORMAL HIGH (ref 70–99)

## 2014-08-16 LAB — BASIC METABOLIC PANEL
Anion gap: 13 (ref 5–15)
BUN: 22 mg/dL (ref 6–23)
CALCIUM: 9.1 mg/dL (ref 8.4–10.5)
CO2: 27 mEq/L (ref 19–32)
CREATININE: 1.31 mg/dL (ref 0.50–1.35)
Chloride: 93 mEq/L — ABNORMAL LOW (ref 96–112)
GFR calc Af Amer: 57 mL/min — ABNORMAL LOW (ref >90)
GFR calc non Af Amer: 49 mL/min — ABNORMAL LOW (ref >90)
GLUCOSE: 176 mg/dL — AB (ref 70–99)
Potassium: 4.7 mEq/L (ref 3.7–5.3)
SODIUM: 133 meq/L — AB (ref 137–147)

## 2014-08-16 LAB — CBC WITH DIFFERENTIAL/PLATELET
Basophils Absolute: 0 10*3/uL (ref 0.0–0.1)
Basophils Relative: 0 % (ref 0–1)
EOS ABS: 0.2 10*3/uL (ref 0.0–0.7)
EOS PCT: 2 % (ref 0–5)
HEMATOCRIT: 34.5 % — AB (ref 39.0–52.0)
Hemoglobin: 11.1 g/dL — ABNORMAL LOW (ref 13.0–17.0)
LYMPHS ABS: 0.8 10*3/uL (ref 0.7–4.0)
LYMPHS PCT: 5 % — AB (ref 12–46)
MCH: 30 pg (ref 26.0–34.0)
MCHC: 32.2 g/dL (ref 30.0–36.0)
MCV: 93.2 fL (ref 78.0–100.0)
MONO ABS: 1.4 10*3/uL — AB (ref 0.1–1.0)
Monocytes Relative: 10 % (ref 3–12)
Neutro Abs: 12.1 10*3/uL — ABNORMAL HIGH (ref 1.7–7.7)
Neutrophils Relative %: 83 % — ABNORMAL HIGH (ref 43–77)
PLATELETS: 442 10*3/uL — AB (ref 150–400)
RBC: 3.7 MIL/uL — AB (ref 4.22–5.81)
RDW: 13 % (ref 11.5–15.5)
WBC: 14.5 10*3/uL — AB (ref 4.0–10.5)

## 2014-08-16 LAB — URINALYSIS, ROUTINE W REFLEX MICROSCOPIC
BILIRUBIN URINE: NEGATIVE
GLUCOSE, UA: NEGATIVE mg/dL
Ketones, ur: NEGATIVE mg/dL
Leukocytes, UA: NEGATIVE
Nitrite: NEGATIVE
SPECIFIC GRAVITY, URINE: 1.015 (ref 1.005–1.030)
UROBILINOGEN UA: 0.2 mg/dL (ref 0.0–1.0)
pH: 6 (ref 5.0–8.0)

## 2014-08-16 LAB — TSH: TSH: 1.14 u[IU]/mL (ref 0.350–4.500)

## 2014-08-16 LAB — HEMOGLOBIN A1C
Hgb A1c MFr Bld: 7.6 % — ABNORMAL HIGH (ref <5.7)
MEAN PLASMA GLUCOSE: 171 mg/dL — AB (ref <117)

## 2014-08-16 LAB — TROPONIN I: Troponin I: <0.3 ng/mL (ref <0.30)

## 2014-08-16 LAB — LACTIC ACID, PLASMA: Lactic Acid, Venous: 1.1 mmol/L (ref 0.5–2.2)

## 2014-08-16 LAB — URINE MICROSCOPIC-ADD ON

## 2014-08-16 MED ORDER — ACETAMINOPHEN 650 MG RE SUPP: 650.0000 mg | Freq: Four times a day (QID) | RECTAL | Status: DC | PRN

## 2014-08-16 MED ORDER — ONDANSETRON HCL 4 MG PO TABS: 4.0000 mg | ORAL_TABLET | Freq: Four times a day (QID) | ORAL | Status: DC | PRN

## 2014-08-16 MED ORDER — IPRATROPIUM-ALBUTEROL 0.5-2.5 (3) MG/3ML IN SOLN
3.0000 mL | Freq: Once | RESPIRATORY_TRACT | Status: AC
Start: 2014-08-16 — End: 2014-08-16
  Administered 2014-08-16: 3 mL via RESPIRATORY_TRACT
  Filled 2014-08-16: qty 3

## 2014-08-16 MED ORDER — DIAZEPAM 2 MG PO TABS: 2.0000 mg | ORAL_TABLET | Freq: Two times a day (BID) | ORAL | Status: DC | PRN

## 2014-08-16 MED ORDER — VANCOMYCIN HCL IN DEXTROSE 1-5 GM/200ML-% IV SOLN
1000.0000 mg | Freq: Once | INTRAVENOUS | Status: DC
  Administered 2014-08-16: 1000 mg via INTRAVENOUS
  Filled 2014-08-16: qty 200

## 2014-08-16 MED ORDER — SODIUM CHLORIDE 0.9 % IV SOLN: INTRAVENOUS | Status: DC

## 2014-08-16 MED ORDER — ONDANSETRON HCL 4 MG/2ML IJ SOLN
4.0000 mg | Freq: Four times a day (QID) | INTRAMUSCULAR | Status: DC | PRN
Start: 2014-08-16 — End: 2014-08-18

## 2014-08-16 MED ORDER — SODIUM CHLORIDE 0.9 % IV SOLN
INTRAVENOUS | Status: DC
  Administered 2014-08-16 – 2014-08-17 (×2): via INTRAVENOUS

## 2014-08-16 MED ORDER — GLUCERNA SHAKE PO LIQD
237.0000 mL | Freq: Two times a day (BID) | ORAL | Status: DC
  Administered 2014-08-16 – 2014-08-18 (×5): 237 mL via ORAL

## 2014-08-16 MED ORDER — ACETAMINOPHEN 325 MG PO TABS
650.0000 mg | ORAL_TABLET | Freq: Once | ORAL | Status: AC
  Administered 2014-08-16: 650 mg via ORAL
  Filled 2014-08-16: qty 2

## 2014-08-16 MED ORDER — SODIUM CHLORIDE 0.9 % IJ SOLN
3.0000 mL | Freq: Two times a day (BID) | INTRAMUSCULAR | Status: DC
  Administered 2014-08-16 – 2014-08-18 (×4): 3 mL via INTRAVENOUS

## 2014-08-16 MED ORDER — SODIUM CHLORIDE 0.9 % IV BOLUS (SEPSIS)
1000.0000 mL | Freq: Once | INTRAVENOUS | Status: AC
  Administered 2014-08-16: 1000 mL via INTRAVENOUS

## 2014-08-16 MED ORDER — ENOXAPARIN SODIUM 40 MG/0.4ML ~~LOC~~ SOLN
40.0000 mg | Freq: Every day | SUBCUTANEOUS | Status: DC
  Administered 2014-08-16 – 2014-08-18 (×3): 40 mg via SUBCUTANEOUS
  Filled 2014-08-16 (×3): qty 0.4

## 2014-08-16 MED ORDER — ACETAMINOPHEN 325 MG PO TABS: 650.0000 mg | ORAL_TABLET | Freq: Four times a day (QID) | ORAL | Status: DC | PRN

## 2014-08-16 MED ORDER — INSULIN ASPART 100 UNIT/ML ~~LOC~~ SOLN: 0.0000 [IU] | Freq: Every day | SUBCUTANEOUS | Status: DC

## 2014-08-16 MED ORDER — DEXTROSE 5 % IV SOLN
2.0000 g | Freq: Once | INTRAVENOUS | Status: AC
  Administered 2014-08-16: 2 g via INTRAVENOUS
  Filled 2014-08-16: qty 2

## 2014-08-16 MED ORDER — INSULIN ASPART 100 UNIT/ML ~~LOC~~ SOLN
0.0000 [IU] | Freq: Three times a day (TID) | SUBCUTANEOUS | Status: DC
  Administered 2014-08-16: 3 [IU] via SUBCUTANEOUS
  Administered 2014-08-16 – 2014-08-17 (×2): 2 [IU] via SUBCUTANEOUS
  Administered 2014-08-17: 1 [IU] via SUBCUTANEOUS
  Administered 2014-08-17: 2 [IU] via SUBCUTANEOUS
  Administered 2014-08-18: 1 [IU] via SUBCUTANEOUS
  Administered 2014-08-18: 3 [IU] via SUBCUTANEOUS

## 2014-08-16 MED ORDER — ALUM & MAG HYDROXIDE-SIMETH 200-200-20 MG/5ML PO SUSP: 30.0000 mL | Freq: Four times a day (QID) | ORAL | Status: DC | PRN

## 2014-08-16 NOTE — Progress Notes (Signed)
ANTIBIOTIC CONSULT NOTE - INITIAL  Pharmacy Consult for Vancomycin & Cefepime Indication: rule out sepsis  Allergies  Allergen Reactions  . Cymbalta [Duloxetine Hcl]   . Iodine   . Lisinopril   . Neurontin [Gabapentin]   . Zoloft [Sertraline Hcl]     Patient Measurements: Height: 6\' 3"  (190.5 cm) Weight: 208 lb (94.348 kg) IBW/kg (Calculated) : 84.5  Vital Signs: Temp: 98 F (36.7 C) (09/16 1031) Temp src: Oral (09/16 1031) BP: 111/54 mmHg (09/16 1031) Pulse Rate: 77 (09/16 1031) Intake/Output from previous day:   Intake/Output from this shift:    Labs:  Recent Labs  08/16/14 0658  WBC 14.5*  HGB 11.1*  PLT 442*  CREATININE 1.31   Estimated Creatinine Clearance: 52 ml/min (by C-G formula based on Cr of 1.31). No results found for this basename: VANCOTROUGH, Corlis Leak, VANCORANDOM, [place], GENTPEAK, GENTRANDOM, TOBRATROUGH, TOBRAPEAK, TOBRARND, AMIKACINPEAK, AMIKACINTROU, AMIKACIN,  in the last 72 hours   Microbiology: Recent Results (from the past 720 hour(s))  CULTURE, BLOOD (ROUTINE X 2)     Status: None   Collection Time    08/16/14  7:56 AM      Result Value Ref Range Status   Specimen Description BLOOD   Final   Special Requests NONE   Final   Culture NO GROWTH <24 HRS   Final   Report Status PENDING   Incomplete  CULTURE, BLOOD (ROUTINE X 2)     Status: None   Collection Time    08/16/14  7:58 AM      Result Value Ref Range Status   Specimen Description BLOOD   Final   Special Requests NONE   Final   Culture NO GROWTH <24 HRS   Final   Report Status PENDING   Incomplete    Medical History: Past Medical History  Diagnosis Date  . Chronic headaches   . Diabetes mellitus without complication   . Hypertension   . Cancer   . Colon cancer   . Diverticulitis   . Syncope   . Hypercholesteremia     Medications:  Scheduled:  . sodium chloride   Intravenous STAT  . enoxaparin (LOVENOX) injection  40 mg Subcutaneous Daily  . insulin aspart   0-5 Units Subcutaneous QHS  . insulin aspart  0-9 Units Subcutaneous TID WC  . sodium chloride  3 mL Intravenous Q12H   Assessment: 50 yoM admitted from home with weakness, confusion, & frequent falls over past couple weeks.  He is febrile on admission with elevated WBC.  Lactic acid level is normal.  Presumed urinary tract infection.  Cx data pending.  CXR negative for PNA.  Estimated CrCl ~90ml/min.  Vancomycin 9/16>> Cefepime 9/16>>  Goal of Therapy:  Vancomycin trough level 15-20 mcg/ml  Plan:  Cefepime 2gm IV q12h Vancomycin 1gm IV x1 then 750mg  IV q12h Check Vancomycin trough at steady state Monitor renal function and cx data  Duration of therapy per MD- consider de-escalate antibiotics as appropriate  Biagio Borg 08/16/2014,12:21 PM

## 2014-08-16 NOTE — ED Notes (Signed)
Report given to [facility] for room 338 on 3A.

## 2014-08-16 NOTE — ED Notes (Signed)
Patient states general body pains that started at 0500 today. Patient states general body weakness that a few months ago. Original fall injury happened May 2014

## 2014-08-16 NOTE — Discharge Instructions (Addendum)
[facility] Stay Proper nutrition can help your body recover from illness and injury.   Foods and beverages high in protein, vitamins, and minerals help rebuild muscle loss, promote healing, & reduce fall risk.   In addition to eating healthy foods, a nutrition shake is an easy, delicious way to get the nutrition you need during and after your hospital stay  It is recommended that you continue to drink 2 bottles per day of:       Glucerna for at least 1 month (30 days) after your hospital stay   Tips for adding a nutrition shake into your routine: As allowed, drink one with vitamins or medications instead of water or juice Enjoy one as a tasty mid-morning or afternoon snack Drink cold or make a milkshake out of it Drink one instead of milk with cereal or snacks Use as a coffee creamer   Available at the following grocery stores and pharmacies:           * [employer] [phone]            For COUPONS visit: www.ensure.com/join or [email or web address]-[email or web address]/   Suggested Substitutions Ensure Plus = Boost Plus = Carnation Breakfast Essentials = Boost Compact Ensure Active Clear = Boost Breeze Glucerna Shake = Boost Glucose Control = Carnation Breakfast Essentials SUGAR FREE  Take medications as directed Monitor capillary blood sugar each morning before breakfast. If reading in greater than 150 take amaryl.  Document daily CBG's and take to PCP visit for evaluation Encourage drinking water.

## 2014-08-16 NOTE — Progress Notes (Addendum)
INITIAL NUTRITION ASSESSMENT  DOCUMENTATION CODES Per approved criteria  -Non-severe (moderate) malnutrition in the context of chronic illness   Pt meets criteria for moderate MALNUTRITION in the context of chronic illness as evidenced by <75% of estimated energy intake x 1 month, mild fat and muscle depletion.  INTERVENTION: -Glucerna Shake po BID, each supplement provides 220 kcal and 10 grams of protein -Educated pt and family on heart healthy diet, diabetic diet, ways to increase meal intake, and options for nutritional supplements. Provided AND Nutrition Care Manual's "Heart Healthy Nutrition Therapy" and "Carbohydrate Counting for Diabetes" handouts. Also provided "[facility] Stay" handout along with coupons for nutritional supplements.   NUTRITION DIAGNOSIS: Inadequate oral intake related to decreased appetite as evidenced by diet hx, limited PO's PTA.   Goal: Pt will meet >90% of estimated nutritional needs  Monitor:  PO/supplement intake, labs, weight changes, I/O's  Reason for Assessment: MST=2   80 y.o. male  Admitting Dx: SIRS (systemic inflammatory response syndrome)  TERANCE POMPLUN is a 80 y.o. male with a history of hypertension, diabetes, hyperlipidemia who presents to the Emergency Department complaining of frequent falls; patient states he has experienced intermittent loss of balance over the past two weeks. He notes associated mild headache and nausea. He does have a fever today but states he was unaware of this. He states he felt better after his previous visit after IV fluids and rehabilitation but is now having balance problems again.   ASSESSMENT: Pt admitted with weakness and dehydration. Extensive history was provided by pt, wife, and daughter at bedside. They share with this RD that prior to 07/01/14, pt was in very good health and independent, including driving by himself. However, his health and functional status has continued to decline over the  past 6 weeks. Daughter report that he is expected to have hip and knee surgery in the future; surgery was put on hold due to pt's medical decline. Additionally, they confirm that pt has had a very poor appetite and minimal interest in eating over the past 6 weeks. Wife and daughter reveal that pt has developed depression and have an appointment with [facility] at the end of the month. He has also been followed by a Home Health Nurse. He was referred for outpatient physical therapy, however, was lost to follow-up due to attempting to find care closer to home.  Diet recall is limited PTA. Per pt daughter, pt has been sleeping for approximately 18 hours per day due to depression. Pt typically eats one meal per day- breakfast of scrambled eggs, bacon, and sausage. Beverages consist mainly of iced coffee. They have recently transitioned to [place] in place of sugar. Daughter reports that she is concerned about pt's glycemic control as a result of his illness; they have been working on eliminating added sugar and salt in his diet to improve glycemic control and to help pt lose weight for upcoming surgery. Confirmed UBW of [phone]  Educated pt and family on basic principles of low sodium and diabetic diet. Discussed alternatives to salt and sugar to help flavor foods. Also discussed ways to improve intake, such as eating smaller, more frequent meals, and downgrading diet consistency to mechanical soft for ease of intake. Discussed ways to increase protein in diet. Also discussed adding nutritional supplement in light of poor po intake. Pt and family agreeable to Glucerna and are interested in continuing supplement at home.  Noted pt did cough a few times after consuming food and liquids. Pt daughter  reports that pt's uvula was removed by ENT. They have never had a formal swallowing evaluation, but notice that pt does tend to cough after eating harder consistency foods.  Labs reviewed. Na: 133 (receivng IVF),  Glucose: 176. CBGS: 182.   Nutrition Focused Physical Exam:  Subcutaneous Fat:  Orbital Region: mild depletion Upper Arm Region: mild depletion Thoracic and Lumbar Region: WDL  Muscle:  Temple Region: mild depletion Clavicle Bone Region: WDL Clavicle and Acromion Bone Region: WDL Scapular Bone Region: WDL Dorsal Hand: mild depletion Patellar Region: mild depletion Anterior Thigh Region: mild depletion Posterior Calf Region: mild depletion  Edema: none present  Noted that mild muscle depletion on lower body is likely due to decreased mobility.   Height: Ht Readings from Last 1 Encounters:  08/16/14 6\' 3"  (1.905 m)    Weight: Wt Readings from Last 1 Encounters:  08/16/14 208 lb (94.348 kg)    Ideal Body Weight: 196#  % Ideal Body Weight: 106%  Wt Readings from Last 10 Encounters:  08/16/14 208 lb (94.348 kg)    Usual Body Weight: 204#  % Usual Body Weight: 102%  BMI:  Body mass index is 26 kg/(m^2). Overweight  Estimated Nutritional Needs: Kcal: 2200-2400 Protein: 113-123 grams Fluid: 2.2-2.4 L  Skin: abrasion, blister, ecchymosis  Diet Order:  Heart Healthy/ Carb Modified  EDUCATION NEEDS: -Education needs addressed  No intake or output data in the 24 hours ending 08/16/14 1129  Last BM: 08/13/14  Labs:   Recent Labs Lab 08/16/14 0658  NA 133*  K 4.7  CL 93*  CO2 27  BUN 22  CREATININE 1.31  CALCIUM 9.1  GLUCOSE 176*    CBG (last 3)   Recent Labs  08/16/14 1111  GLUCAP 182*   Scheduled Meds: . sodium chloride   Intravenous STAT  . enoxaparin (LOVENOX) injection  40 mg Subcutaneous Daily  . insulin aspart  0-5 Units Subcutaneous QHS  . insulin aspart  0-9 Units Subcutaneous TID WC  . sodium chloride  3 mL Intravenous Q12H    Continuous Infusions:   Past Medical History  Diagnosis Date  . Chronic headaches   . Diabetes mellitus without complication   . Hypertension   . Cancer   . Colon cancer   . Diverticulitis   .  Syncope   . Hypercholesteremia     History reviewed. No pertinent past surgical history.  Darrnell Mangiaracina A. Jimmye Norman, RD, LDN Pager: [phone]

## 2014-08-16 NOTE — ED Notes (Signed)
MD at bedside. 

## 2014-08-16 NOTE — ED Notes (Signed)
Respiratory called for breathing tx

## 2014-08-16 NOTE — ED Notes (Signed)
SpO2 87-89%, placed on 2L nasal cannula, SpO2 now 95-96%.

## 2014-08-16 NOTE — H&P (Signed)
The patient was seen and examined. His chart, vital signs, and laboratory studies were reviewed. He was discussed with nurse practitioner Ms. Black. Agree with her findings with additions below.  1. SIRS with fever, leukocytosis, and likely urinary tract infection. Also concern for early pneumonia. We'll continue vancomycin and cefepime as ordered. We'll order a followup chest x-ray in the morning following hydration to see if an infiltrate fluffs out.  -Hypoxemia/acute respiratory failure with hypoxia. His chest x-ray reveals no edema or consolidation. This is likely transient from his ill state. We'll continue to follow and provide oxygen supplementation accordingly. -Urinary tract infection with hematuria. The hematuria was attributed to an in and out catheter, though this is not certain. He does not have a Foley catheter. Will continue antibiotics as above and recheck an UA in couple of days. -Acute encephalopathy. The family reports confusion at home. Per my exam, the patient is alert and oriented x3. He appears ill, but his answers are appropriate and he follows commands without difficulty. He may have mild confusion from his ill state. CT of his head revealed chronic changes, but no acute abnormalities. As stated by Ms. Black, the encephalopathy is likely multifactorial including infection, medication-induced, and dehydration. We'll order a TSH and vitamin B12 for further evaluation. -History of depression. He is not suicidal. He will followup with psychiatry on an outpatient basis. -Frequent falls. The patient does likely have an underlying gait disorder from his diffuse degenerative joint disease. He has had back surgery with hardware in his lower spine. He also has severe degenerative joint disease of his knees and his left hip. These will all contribute to his risk of falling. This is also in the setting of psychotropic medications and generalized ill state. These psychotropic medications have  been reduced or withheld per 24 hours. We'll order physical therapy evaluation. -Hyponatremia, likely from dehydration/volume depletion. We'll continue IV fluids and started. -Type 2 diabetes mellitus. Apparently this is diet controlled as he was taken off metformin in the past. We'll check a hemoglobin A1c. Sliding scale NovoLog was started. -Mild anemia. Anemia panel ordered.

## 2014-08-16 NOTE — H&P (Signed)
[facility] History and Physical  Jared Bridges SKA:[id number] DOB: 1930-10-01 DOA: 08/16/2014  Referring physician:  PCP: Margaretmary Eddy, MD   Chief Complaint:   HPI: Jared Bridges is a 80 y.o. male with a past medical history that includes hypertension, diabetes, diverticulitis presents to the emergency department with the chief complaint of generalized weakness with confusion and frequent falls. Initial evaluation in the emergency department reveals a urinary tract infection, hypoxia and possible polyp pharmacy.  Information is obtained from the patient and the daughter who is at the bedside. Daughter reports over the last 4-5 weeks patient has developed generalized weakness unsteady gait with frequent falls. During this time period he was provided with pain medicine for aches he developed after falling and antidepressant for depression he developed. Daughter reports patient returned  home in [place] 2 weeks ago after spending 1 month in [place] with daughter. Over last 3 days he has become more confused and is eating and drinking less. His most recent fall was 5 days ago. He denies dizziness loss of consciousness or hitting his head. He reports "I lose my footing". His family has noted and unsteady gait over the last several weeks. Has been no chest pain palpitations shortness of breath. The daughter does indicate that he has been running in low grade fever intermittently. He denies dysuria hematuria frequency or urgency. He denies abdominal pain nausea vomiting diarrhea or constipation.  Workup in the emergency room includes a basic metabolic panel significant for sodium of 133 chloride of 93 serum glucose of 176. Complete blood count significant for a leukocytosis of 14.5 hemoglobin 11.1 platelets 443. Troponin was -0.30. Lactic acid 1.1. Urinalysis with rare bacteria, RBCs too numerous to count, wbc's 3-6. CT of the head reveals moderate atrophy with mild patchy periventricular small  vessel disease. There is no intracranial mass, hemorrhage, or extra-axial fluid. Chest xray no edema or consolidation. Probable chronic acromioclavicular separation on the right. EKG SR.   In ED he temperature 100.7, respirations 35, oxygen saturation level 87% on room air at rest, mild tachycardia, blood pressure stable.    Review of Systems:  10 point review of systems completed all systems are negative except as indicated in the history of present illness   Past Medical History  Diagnosis Date  . Chronic headaches   . Diabetes mellitus without complication   . Hypertension   . Cancer   . Colon cancer   . Diverticulitis   . Syncope   . Hypercholesteremia    History reviewed. No pertinent past surgical history. Social History:  reports that he quit smoking about 57 years ago. He does not have any smokeless tobacco history on file. He reports that he does not drink alcohol or use illicit drugs.  Allergies  Allergen Reactions  . Cymbalta [Duloxetine Hcl]   . Iodine   . Lisinopril   . Neurontin [Gabapentin]   . Zoloft [Sertraline Hcl]     History reviewed. No pertinent family history. family medical history reviewed and is noncontributory to the admission of this elderly gentleman  Prior to Admission medications   Medication Sig Start Date End Date Taking? Authorizing Provider  diazepam (VALIUM) 5 MG tablet Take 5 mg by mouth 2 (two) times daily as needed for anxiety.    Yes Historical Provider, MD  HYDROcodone-acetaminophen (NORCO/VICODIN) 5-325 MG per tablet Take 1 tablet by mouth every 6 (six) hours as needed for moderate pain.   Yes Historical Provider, MD  pregabalin (LYRICA) 75 MG capsule Take  75 mg by mouth at bedtime.    Yes Historical Provider, MD  venlafaxine XR (EFFEXOR-XR) 37.5 MG 24 hr capsule Take 112.5 mg by mouth daily with breakfast.   Yes Historical Provider, MD   Physical Exam: Filed Vitals:   08/16/14 0900 08/16/14 0920 08/16/14 0930 08/16/14 1031  BP:  120/54  137/58 111/54  Pulse: 82  80 77  Temp:  99.5 F (37.5 C)  98 F (36.7 C)  TempSrc:  Oral  Oral  Resp:    20  Height:    '6\' 3"'  (1.905 m)  Weight:    94.348 kg (208 lb)  SpO2: 90%  94% 96%    Wt Readings from Last 3 Encounters:  08/16/14 94.348 kg (208 lb)    General:  Appears calm and comfortable Eyes: PERRL, normal lids, irises & conjunctiva ENT: grossly normal hearing, because membranes of his mouth are pink but dry Neck: no LAD, masses or thyromegaly Cardiovascular: RRR, +murmur, no gallop no rub. No LE edema. Pedal pulses present and palpable Respiratory: CTA bilaterally but somewhat diminished. I hear no wheeze no rhonchi. Normal respiratory effort. Abdomen: soft, ntnd positive bowel sounds throughout no guarding no rebound Skin: no rash or induration seen on limited exam, few abrasions on right elbow left knee Musculoskeletal: grossly normal tone BUE/BLE joint without swelling/erythema full range of motion to his right knee in his right hip, joints nontender to palpate Psychiatric: grossly normal mood and affect, speech fluent and appropriate Neurologic: grossly non-focal. Speech clear facial symmetry cranial nerves II through XII grossly           Labs on Admission:  Basic Metabolic Panel:  Recent Labs Lab 08/16/14 0658  NA 133*  K 4.7  CL 93*  CO2 27  GLUCOSE 176*  BUN 22  CREATININE 1.31  CALCIUM 9.1   Liver Function Tests: No results found for this basename: AST, ALT, ALKPHOS, BILITOT, PROT, ALBUMIN,  in the last 168 hours No results found for this basename: LIPASE, AMYLASE,  in the last 168 hours No results found for this basename: AMMONIA,  in the last 168 hours CBC:  Recent Labs Lab 08/16/14 0658  WBC 14.5*  NEUTROABS 12.1*  HGB 11.1*  HCT 34.5*  MCV 93.2  PLT 442*   Cardiac Enzymes:  Recent Labs Lab 08/16/14 0658  TROPONINI <0.30    BNP (last 3 results) No results found for this basename: PROBNP,  in the last 8760  hours CBG:  Recent Labs Lab 08/16/14 1111  GLUCAP 182*    Radiological Exams on Admission: Ct Head Wo Contrast  08/16/2014   CLINICAL DATA:  Recent fall  EXAM: CT HEAD WITHOUT CONTRAST  TECHNIQUE: Contiguous axial images were obtained from the base of the skull through the vertex without intravenous contrast.  COMPARISON:  None.  FINDINGS: There is moderate generalized atrophy. There is no mass, hemorrhage, extra-axial fluid collection, or midline shift. There is patchy small vessel disease in the centra semiovale bilaterally. Elsewhere gray-white compartments appear normal. There is no demonstrable acute infarct. The bony calvarium appears intact. The mastoid air cells are clear.  IMPRESSION: Moderate atrophy with mild patchy periventricular small vessel disease. There is no intracranial mass, hemorrhage, or extra-axial fluid.   Electronically Signed   By: Lowella Grip M.D.   On: 08/16/2014 08:22   Dg Chest Portable 1 View  08/16/2014   CLINICAL DATA:  Weakness and generalized aching; hypertension  EXAM: PORTABLE CHEST - 1 VIEW  COMPARISON:  January 14, 2012  FINDINGS: There is no edema or consolidation. Heart is upper limits normal in size with pulmonary vascularity within normal limits. No adenopathy. There is an old healed fracture of the left clavicle. There appears to be acromioclavicular separation on the right, likely chronic.  IMPRESSION: No edema or consolidation. Probable chronic acromioclavicular separation on the right.   Electronically Signed   By: Lowella Grip M.D.   On: 08/16/2014 08:10    EKG: Independently reviewed. NSR  Assessment/Plan Principal Problem:   SIRS (systemic inflammatory response syndrome); fever, leukocytosis and  likely urinary tract infection. Will admit to tele. Will provide supportive therapy in form of IV fluids, oxygen supplementation, antibiotics. Obtain urine culture and blood cultures. Will adjust antibiotics as indicated via cultures.   Active Problems:   Hypoxemia: chest xray with no consolidation or infiltrate. Given decreased breath sounds some concern for early pneumonia. Will gently hydrate and repeat chest xray in am. Continue vancomycin and cefepime that was initiated in ED. Monitor.     UTI (lower urinary tract infection): will obtain urine culture. Family reports decreased po intake of late which could be contributing. Antibiotics as above.    Acute encephalopathy: CT head without acute abnormality. Neuro exam without focal defecits.  may be multifactorial i.e. Dehydration, UTI, medications. Will gently hydrate, treat UTI with antibiotics and hold home lyrica, norco and effexor. Patient also on low dose valium. Will provide prn Valium to be used judiciously.      Gross hematuria: Likely related to Foley. Will monitor intake and output.      Dehydration: Related to decreased by mouth intake over the last several days. Will gently hydrate with IV fluids. Will provide a car modified diet. Monitor intake and output. Check be met in the a.m.    Hyponatremia: Related to above. IV fluids as indicated above. Will recheck basic metabolic panel in the morning    Frequent falls: Frequency of falls coincides with the timing of introducing pain medicine and antidepressants. Neuro exam benign CT of the head without acute findings. I will hold his Norco Effexor and Lyrica. Will provide low dose Valium on an as-needed basis to address any possible withdrawals. Will request physical therapy evaluation    Hypertension: Currently stable. Is not on any antihypertensives. Monitor closely    Diabetes mellitus without complication: Diet controlled. He was on metformin in the past but has been taken off of that recently. All check a hemoglobin A1c and provide a car modified diet as well sliding scale coverage for optimal control  Depression. Once we get over the acute illness will consider telemetry psych consult. Patient does have an  appointment with psychiatry on the outpatient basis  Anemia. Mild. May drop somewhat with IV fluids. Will check an anemia panel and a B12.    Code Status: full DVT Prophylaxis: Family Communication: wife and daughter at bedside Disposition Plan: home when ready  Time spent: 25 minutes  [facility] Pager [phone]

## 2014-08-16 NOTE — ED Provider Notes (Addendum)
TIME SEEN: 7:10 AM  This chart was scribed for [place], DO by Rayfield Citizen, ED Scribe. This patient was seen in room APA06/APA06 and the patient's care was started at 7:15 AM.   CHIEF COMPLAINT: Fall  HPI:   HPI Comments: Jared Bridges is a 80 y.o. male with a history of hypertension, diabetes, hyperlipidemia who presents to the Emergency Department complaining of frequent falls; patient states he has experienced intermittent loss of balance over the past two weeks (He notes a specific date of his first fall: May 28th, 2015). Patient denies hitting his head or LOC during today's fall; unable to reliably tell me if he hit his head on any of his other falls. He notes associated mild headache and nausea. He does have a fever today but states he was unaware of this. He appears confused regarding associated weakness, but reports generalized weakness. He denies chest pain, SOB, cough, vomiting or diarrhea, dysuria or hematuria, rash, sick contacts, neck pain or neck stiffness. Patient does not wear oxygen at home. Patient was in [place] two weeks ago. He states he felt better after his previous visit after IV fluids and rehabilitation but is now having balance problems again.    PCP is Dr. Sallee Provencal.   ROS: See HPI Constitutional: fever, generalized weakness Eyes: no drainage  ENT: no runny nose   Cardiovascular:  no chest pain  Resp: no SOB, no cough GI: reports nausea, denies vomiting GU: no dysuria Integumentary: no rash  Allergy: no hives  Musculoskeletal: no leg swelling  Neurological: associated HAs ROS otherwise negative  PAST MEDICAL HISTORY/PAST SURGICAL HISTORY:  Past Medical History  Diagnosis Date  . Chronic headaches   . Diabetes mellitus without complication   . Hypertension   . Cancer   . Colon cancer   . Diverticulitis   . Syncope   . Hypercholesteremia     MEDICATIONS:  Prior to Admission medications   Medication Sig Start Date End Date Taking?  Authorizing Provider  ALPRAZolam Duanne Moron) 0.5 MG tablet Take 0.5 mg by mouth at bedtime as needed for anxiety.   Yes Historical Provider, MD  diazepam (VALIUM) 5 MG tablet Take 5 mg by mouth every 6 (six) hours as needed for anxiety.   Yes Historical Provider, MD  HYDROcodone-acetaminophen (NORCO/VICODIN) 5-325 MG per tablet Take 1 tablet by mouth every 6 (six) hours as needed for moderate pain.   Yes Historical Provider, MD  pregabalin (LYRICA) 75 MG capsule Take 75 mg by mouth 2 (two) times daily.   Yes Historical Provider, MD  traMADol (ULTRAM) 50 MG tablet Take by mouth every 6 (six) hours as needed.   Yes Historical Provider, MD  venlafaxine (EFFEXOR) 37.5 MG tablet Take 37.5 mg by mouth 2 (two) times daily.   Yes Historical Provider, MD    ALLERGIES:  Allergies  Allergen Reactions  . Cymbalta [Duloxetine Hcl]   . Iodine   . Lisinopril   . Neurontin [Gabapentin]   . Zoloft [Sertraline Hcl]     SOCIAL HISTORY:  History  Substance Use Topics  . Smoking status: Former Research scientist (life sciences)  . Smokeless tobacco: Not on file  . Alcohol Use: No    FAMILY HISTORY: History reviewed. No pertinent family history.  EXAM: BP 138/77  Pulse 85  Temp(Src) 100.7 F (38.2 C) (Oral)  Resp 18  Ht 6\' 2"  (1.88 m)  Wt 204 lb (92.534 kg)  BMI 26.18 kg/m2  SpO2 92% CONSTITUTIONAL: Alert and oriented x 3 and responds  appropriately to questions. Well-appearing; well-nourished, elderly, appears slightly intermittently confused HEAD: Normocephalic EYES: Conjunctivae clear, PERRL ENT: normal nose; no rhinorrhea; slightly dry mucous membranes; pharynx without lesions noted NECK: Supple, no meningismus, no LAD  CARD: RRR; S1 and S2 appreciated; mild systolic murmur appreciated, no clicks, no rubs, no gallops RESP: Normal chest excursion without splinting, patient is mildly hypoxic with oxygen saturation of 91-93%, mild tachypnea, speaking full sentences, no respiratory distress, scattered expiratory wheezing,  diminished at his bases bilaterally, no rhonchi or rales ABD/GI: Normal bowel sounds; non-distended; soft, non-tender, no rebound, no guarding BACK:  The back appears normal and is non-tender to palpation, there is no CVA tenderness, small area of ecchymosis to the left flank that appears to be healing EXT: Normal ROM in all joints; non-tender to palpation; no edema; normal capillary refill; no cyanosis    SKIN: Normal color for age and race; warm NEURO: Moves all extremities equally, strength is 5/5 in all 4 extremities, sensation to light touch intact diffusely, cranial nerves II through XII intact PSYCH: The patient's mood and manner are appropriate. Grooming and personal hygiene are appropriate.  MEDICAL DECISION MAKING: Patient here denies weakness, frequent falls and fever. He is mildly hypoxic, tachypneic with scattered wheezing on exam. Concern for possible pneumonia, UTI, bacteremia. He is otherwise neurologically intact but unable to make you have had recently during his falls. We'll obtain labs including cultures, urine, lactate, chest x-ray and head CT. Will give IV fluids, DuoNeb and reassess. I feel patient will likely need admission.  ED PROGRESS:   8:52 AM Reassessed pt.  tachypnea his persistent tachycardia has improved. He still has slightly diminished breath sounds bilaterally oxygen saturation 93-95%. Given his tachycardia, tachypnea and fever, will give broad-spectrum antibiotics for possible sepsis. No source yet.  9:31 AM  Pt's urine shows hemoglobin but no other sign of infection. This may have been from catheterization. Given patient is still very weak, feeling at bedside reporting that he has been confused for the past several weeks and has been progressively worsening and he meets SIRS criteria, will admit to medicine. Blood and urine cultures pending. He has a nonfocal neurologic exam. Denies any headache, neck pain or neck stiffness currently. No meningismus on exam. Family  reports that normally he is "sharp as a tack" but has been progressively declining over the past several weeks. I doubt that he has meningitis or encephalitis as the source of his infection.  9:50 AM  Spoke with Dr. Caryn Section with hospitalist service for admission to telemetry, inpatient for denies weakness, fever, tachycardia and tachypnea.      EKG Interpretation  Date/Time:  Wednesday August 16 2014 06:54:20 EDT Ventricular Rate:  86 PR Interval:  192 QRS Duration: 82 QT Interval:  358 QTC Calculation: 428 R Axis:   -42 Text Interpretation:  Sinus rhythm Abnormal R-wave progression, early transition Inferior infarct, old Confirmed by Luella Gardenhire,  DO, Aiyonna Lucado ([id number]) on 08/16/2014 7:42:12 AM         I personally performed the services described in this documentation, which was scribed in my presence. The recorded information has been reviewed and is accurate.     [place], DO 08/16/14 [place], DO 08/16/14 [phone]

## 2014-08-16 NOTE — ED Notes (Signed)
Pt c/o increased weakness over the last month. Pt c/o pain all over.

## 2014-08-17 ENCOUNTER — Inpatient Hospital Stay (HOSPITAL_COMMUNITY): Payer: Medicare Other

## 2014-08-17 ENCOUNTER — Encounter (HOSPITAL_COMMUNITY): Payer: Self-pay | Admitting: Internal Medicine

## 2014-08-17 DIAGNOSIS — E119 Type 2 diabetes mellitus without complications: Secondary | ICD-10-CM

## 2014-08-17 DIAGNOSIS — D649 Anemia, unspecified: Secondary | ICD-10-CM

## 2014-08-17 DIAGNOSIS — Z9181 History of falling: Secondary | ICD-10-CM

## 2014-08-17 LAB — RETICULOCYTES
RBC.: 3.22 MIL/uL — AB (ref 4.22–5.81)
Retic Count, Absolute: 54.7 10*3/uL (ref 19.0–186.0)
Retic Ct Pct: 1.7 % (ref 0.4–3.1)

## 2014-08-17 LAB — URINE CULTURE
COLONY COUNT: NO GROWTH
Culture: NO GROWTH

## 2014-08-17 LAB — BASIC METABOLIC PANEL
Anion gap: 9 (ref 5–15)
BUN: 18 mg/dL (ref 6–23)
CO2: 26 meq/L (ref 19–32)
CREATININE: 1.14 mg/dL (ref 0.50–1.35)
Calcium: 8.5 mg/dL (ref 8.4–10.5)
Chloride: 101 mEq/L (ref 96–112)
GFR calc Af Amer: 67 mL/min — ABNORMAL LOW (ref >90)
GFR, EST NON AFRICAN AMERICAN: 58 mL/min — AB (ref >90)
GLUCOSE: 159 mg/dL — AB (ref 70–99)
Potassium: 4.3 mEq/L (ref 3.7–5.3)
Sodium: 136 mEq/L — ABNORMAL LOW (ref 137–147)

## 2014-08-17 LAB — GLUCOSE, CAPILLARY
GLUCOSE-CAPILLARY: 153 mg/dL — AB (ref 70–99)
GLUCOSE-CAPILLARY: 171 mg/dL — AB (ref 70–99)
GLUCOSE-CAPILLARY: 147 mg/dL — AB (ref 70–99)
Glucose-Capillary: 131 mg/dL — ABNORMAL HIGH (ref 70–99)

## 2014-08-17 LAB — CBC
HEMATOCRIT: 30.7 % — AB (ref 39.0–52.0)
Hemoglobin: 9.9 g/dL — ABNORMAL LOW (ref 13.0–17.0)
MCH: 29.6 pg (ref 26.0–34.0)
MCHC: 32.2 g/dL (ref 30.0–36.0)
MCV: 91.9 fL (ref 78.0–100.0)
Platelets: 408 10*3/uL — ABNORMAL HIGH (ref 150–400)
RBC: 3.34 MIL/uL — AB (ref 4.22–5.81)
RDW: 13.2 % (ref 11.5–15.5)
WBC: 11.3 10*3/uL — ABNORMAL HIGH (ref 4.0–10.5)

## 2014-08-17 LAB — IRON AND TIBC
Iron: <10 ug/dL — ABNORMAL LOW (ref 42–135)
UIBC: 129 ug/dL (ref 125–400)

## 2014-08-17 LAB — VITAMIN B12: Vitamin B-12: 428 pg/mL (ref 211–911)

## 2014-08-17 MED ORDER — VENLAFAXINE HCL ER 37.5 MG PO CP24
37.5000 mg | ORAL_CAPSULE | Freq: Every day | ORAL | Status: DC
  Administered 2014-08-18: 37.5 mg via ORAL
  Filled 2014-08-17 (×4): qty 1

## 2014-08-17 MED ORDER — VANCOMYCIN HCL IN DEXTROSE 750-5 MG/150ML-% IV SOLN
750.0000 mg | Freq: Two times a day (BID) | INTRAVENOUS | Status: DC
  Administered 2014-08-17 – 2014-08-18 (×3): 750 mg via INTRAVENOUS
  Filled 2014-08-17 (×9): qty 150

## 2014-08-17 MED ORDER — PANTOPRAZOLE SODIUM 40 MG PO TBEC
40.0000 mg | DELAYED_RELEASE_TABLET | Freq: Every day | ORAL | Status: DC
  Administered 2014-08-17 – 2014-08-18 (×2): 40 mg via ORAL
  Filled 2014-08-17 (×2): qty 1

## 2014-08-17 MED ORDER — DEXTROSE 5 % IV SOLN
2.0000 g | Freq: Two times a day (BID) | INTRAVENOUS | Status: DC
  Administered 2014-08-17 – 2014-08-18 (×3): 2 g via INTRAVENOUS
  Filled 2014-08-17 (×9): qty 2

## 2014-08-17 NOTE — Progress Notes (Signed)
The patient was seen and examined. His chart, vitals signs, and laboratory studies were reviewed. He was discussed with nurse protection her, Ms. Renard Hamper. Agree with her findings, with additions below.  The patient appears to be improving symptomatically and clinically. He has a 2/6 systolic murmur, but he denies chest pain or palpitations. The physical therapist evaluated the patient and the recommendations are noted and appreciated. We'll order home health physical therapy upon discharge. Both his vitamin B12 level and TSH were within normal limits. His hemoglobin A1c was 7.6. We'll continue sliding scale NovoLog and carbohydrate modified diet for now, but will consider restarting metformin as he has been on in the past versus starting bedtime dosing of Lantus chronically. Will restart Effexor at a smaller dose for treatment of depression. We'll await the results of the anemia panel pending. However, we'll start Protonix empirically as he has a history of peptic ulcer disease remotely. He also has a history of colon cancer. He will likely need another colonoscopy soon. The patient could not recall when he is supposed to have another surveillance colonoscopy. This will be discussed with his family. He also reports some dysphagia, but I witnessed the patient needing a piece of chicken rather effectively. In fact, he ate everything that was on his tray practically.

## 2014-08-17 NOTE — Evaluation (Signed)
Clinical/Bedside Swallow Evaluation Patient Details  Name: Jared Bridges MRN: [id number] Date of Birth: 02/13/1930  Today's Date: 08/17/2014 Time: 1231-1322 SLP Time Calculation (min): 51 min  Past Medical History:  Past Medical History  Diagnosis Date  . Chronic headaches   . Diabetes mellitus without complication   . Hypertension   . Cancer   . Colon cancer   . Diverticulitis   . Syncope   . Hypercholesteremia   . GI bleed     seen by rehman 1998  . Peptic ulcer disease Early 2000 late 1990s    "Bleeding ulcer".   Past Surgical History:  Past Surgical History  Procedure Laterality Date  . Uvulectomy  2004   HPI:  Jared Bridges is a 80 y.o. male with a past medical history that includes hypertension, diabetes, diverticulitis presents to the emergency department with the chief complaint of generalized weakness with confusion and frequent falls. Initial evaluation in the emergency department reveals a urinary tract infection, hypoxia and possible polyp pharmacy. Information is obtained from the patient and the daughter who is at the bedside. Daughter reports over the last 4-5 weeks patient has developed generalized weakness unsteady gait with frequent falls. During this time period he was provided with pain medicine for aches he developed after falling and antidepressant for depression he developed. Daughter reports patient returned home in [place] 2 weeks ago after spending 1 month in [place] with daughter. Over last 3 days he has become more confused and is eating and drinking less. His most recent fall was 5 days ago. He denies dizziness loss of consciousness or hitting his head. He reports "I lose my footing". His family has noted and unsteady gait over the last several weeks. Has been no chest pain palpitations shortness of breath. The daughter does indicate that he has been running in low grade fever intermittently. He denies dysuria hematuria frequency or urgency. He denies  abdominal pain nausea vomiting diarrhea or constipation. Pt had some of his uvulae removed about 6 years ago due to sleep apnea. SLP ordered to assess swallow due to reported coughing/choking with meals.   Assessment / Plan / Recommendation Clinical Impression  [name] is a delightful man who reports intermittent coughing during meals over the past few years. He reported that he awoke at 4 AM with a strong coughing episode. He had part of his uvulae removed ~6 years ago to ease night time breathing. Upon inspection, a good portion still remains. Oral motor examination is otherwise unremarkable. Pt tolerated regular textures, puree, and thin liquids via cup and straw sips with only one episode of small cough after consuming crackers and then drinking water. His risk for aspiration appears low, however he was given safe swallowing strategies to follow: sit fully upright for all meals (had been eating in bed at home lately), remain upright for 30-60 minutes post meals, swallow 2x for each bite/sip, and go slowly with intake. Dtr present toward the end of the evaluation and in agreement with plan. Pt may also benefit from sleeping with HOB slightly raised as part of reflux precautions. Dtr and patient appreciative of visit. Can consider mechanical soft for ease of intake, however pt tolerated regular textures well and I would not want to restrict his diet unless necessary given his poor intake PTA. He did consume 75% of his lunch meal today and reported no difficulties. SLP will follow for diet tolerance x1.    Aspiration Risk  Mild    Diet Recommendation  Regular;Dysphagia 3 (Mechanical Soft);Thin liquid   Liquid Administration via: Cup;Straw Medication Administration: Whole meds with liquid Supervision: Patient able to self feed Compensations: Slow rate;Multiple dry swallows after each bite/sip Postural Changes and/or Swallow Maneuvers: Out of bed for meals;Seated upright 90 degrees;Upright 30-60 min  after meal    Other  Recommendations Oral Care Recommendations: Oral care BID Other Recommendations: Clarify dietary restrictions   Follow Up Recommendations  None    Frequency and Duration min 1 x/week  1 week   Pertinent Vitals/Pain VSS    SLP Swallow Goals   Pt will demonstrate safe and efficient consumption of least restrictive diet with use of strategies as needed.   Swallow Study Prior Functional Status   Lives at home with wife    General Date of Onset: 08/16/14 HPI: Jared Bridges is a 80 y.o. male with a past medical history that includes hypertension, diabetes, diverticulitis presents to the emergency department with the chief complaint of generalized weakness with confusion and frequent falls. Initial evaluation in the emergency department reveals a urinary tract infection, hypoxia and possible polyp pharmacy. Information is obtained from the patient and the daughter who is at the bedside. Daughter reports over the last 4-5 weeks patient has developed generalized weakness unsteady gait with frequent falls. During this time period he was provided with pain medicine for aches he developed after falling and antidepressant for depression he developed. Daughter reports patient returned home in [place] 2 weeks ago after spending 1 month in [place] with daughter. Over last 3 days he has become more confused and is eating and drinking less. His most recent fall was 5 days ago. He denies dizziness loss of consciousness or hitting his head. He reports "I lose my footing". His family has noted and unsteady gait over the last several weeks. Has been no chest pain palpitations shortness of breath. The daughter does indicate that he has been running in low grade fever intermittently. He denies dysuria hematuria frequency or urgency. He denies abdominal pain nausea vomiting diarrhea or constipation. Pt had some of his uvulae removed about 6 years ago due to sleep apnea. SLP ordered to assess swallow  due to reported coughing/choking with meals. Type of Study: Bedside swallow evaluation Previous Swallow Assessment: None on record Diet Prior to this Study: Regular;Thin liquids Temperature Spikes Noted: No Respiratory Status: Room air History of Recent Intubation: No Behavior/Cognition: Alert;Cooperative;Pleasant mood Oral Cavity - Dentition: Adequate natural dentition Self-Feeding Abilities: Able to feed self Patient Positioning: Upright in bed Baseline Vocal Quality: Clear Volitional Cough: Strong Volitional Swallow: Able to elicit    Oral/Motor/Sensory Function Overall Oral Motor/Sensory Function: Appears within functional limits for tasks assessed   Ice Chips Ice chips: Within functional limits Presentation: Spoon   Thin Liquid Thin Liquid: Within functional limits Presentation: Cup;Self Fed;Straw    Nectar Thick Nectar Thick Liquid: Not tested   Honey Thick Honey Thick Liquid: Not tested   Puree Puree: Within functional limits Presentation: Spoon   Solid       Solid: Within functional limits Presentation: Self Fed      Thank you,  Genene Churn, [place]  Chamara Dyck 08/17/2014,4:22 PM

## 2014-08-17 NOTE — Progress Notes (Signed)
[facility] PROGRESS NOTE  EWARD Bridges BTD:[id number] DOB: Mar 30, 1930 DOA: 08/16/2014 PCP: Margaretmary Eddy, MD  Assessment/Plan: Principal Problem:  SIRS (systemic inflammatory response syndrome); fever, leukocytosis and likely urinary tract infection. Repeat chest xray without infiltrate. Leukocytosis trending downward.   Max temp 99.8 orally. Continue  IV fluids at lower rate as BP stable and taking po fluids well. Oxygen saturation level 90% on room air.  Await urine culture, blood cultures no growth to date. Vancomycin and cefepime day #2.    Active Problems:   Hypoxemia: Repeat chest xray this am with no consolidation or infiltrate. Oxygen saturation level remains low end of normal i.e. 90 on room air. Oxygen supplementation. Monitor.   UTI (lower urinary tract infection): Await urine culture. Urine output fair.  Antibiotics as above.   Acute encephalopathy: CT head without acute abnormality. Neuro exam without focal defecits. Somewhat confused regarding time this am. TSH and B12 within limits of normal.   Likely multifactorial i.e. Dehydration, UTI, medications. Continue to hold home lyrica, norco and effexor. Patient also on low dose valium. Will provide prn Valium to be used judiciously.   Gross hematuria: Likely related to Foley. 800ccclear urine out. Will recheck U/A in am.  Dehydration: Improved. continue IV fluids at lower rate. Taking po fluids well.  Monitor intake and output.   Hyponatremia: Related to above. Improved with  IV fluids.  Will recheck basic metabolic panel in the morning   Frequent falls: Frequency of falls coincides with the timing of introducing pain medicine and antidepressants. Neuro exam benign CT of the head without acute findings. Continue to hold his Norco Effexor and Lyrica. Will provide low dose Valium on an as-needed basis to address any possible withdrawals. Await physical therapy evaluation   Hypertension: Fair control. Is not on any  antihypertensives. Monitor closely   Diabetes mellitus without complication: Diet controlled. He was on metformin in the past but has been taken off of that recently. Hemoglobin A1c 7.6. CBG range 153 Continue carb modified diet as well sliding scale coverage for optimal control   Depression. Once we get over the acute illness will consider telemetry psych consult. Patient does have an appointment with psychiatry on the outpatient basis   Anemia. Hg dropped to 9.9 from 11.1 after hydration. Hx of GI bleed. No aspirin or NSAID use per patient and family.  Await anemia panel. Will obtain FOBT as well.   ?dysphagia. Patient hx uvulectomy 2004. Reportedly with some worsening aspiration tendency of late. Will request ST bedside eval.     Code Status: full Family Communication: daughter on phone Disposition Plan: home hopefully tomorrow.    Consultants:  none  Procedures:  none  Antibiotics:  Vancomycin 08/16/14>>  Cefepime 08/16/14>>  HPI/Subjective: Awake alert. Reports less pain in hips and right shoulder. States "i choked on my scrambled eggs".  Objective: Filed Vitals:   08/17/14 0533  BP: 135/58  Pulse: 74  Temp: 99.6 F (37.6 C)  Resp: 20    Intake/Output Summary (Last 24 hours) at 08/17/14 0912 Last data filed at 08/17/14 0600  Gross per 24 hour  Intake 993.75 ml  Output    800 ml  Net 193.75 ml   Filed Weights   08/16/14 0645 08/16/14 1031  Weight: 92.534 kg (204 lb) 94.348 kg (208 lb)    Exam:   General:  Well nourished appears comfortable, somewhat warm and clammy  Cardiovascular: RRR +murmur no gallup or rub. No LE edema PPP  Respiratory: normal effort. BS with  improved air flow but still somewhat diminished  Abdomen: soft +BS non-tender to palpation no guarding or rebound  Musculoskeletal: joints without swelling/erythema   Data Reviewed: Basic Metabolic Panel:  Recent Labs Lab 08/16/14 0658 08/17/14 0544  NA 133* 136*  K 4.7 4.3  CL  93* 101  CO2 27 26  GLUCOSE 176* 159*  BUN 22 18  CREATININE 1.31 1.14  CALCIUM 9.1 8.5   Liver Function Tests: No results found for this basename: AST, ALT, ALKPHOS, BILITOT, PROT, ALBUMIN,  in the last 168 hours No results found for this basename: LIPASE, AMYLASE,  in the last 168 hours No results found for this basename: AMMONIA,  in the last 168 hours CBC:  Recent Labs Lab 08/16/14 0658 08/17/14 0544  WBC 14.5* 11.3*  NEUTROABS 12.1*  --   HGB 11.1* 9.9*  HCT 34.5* 30.7*  MCV 93.2 91.9  PLT 442* 408*   Cardiac Enzymes:  Recent Labs Lab 08/16/14 0658 08/16/14 1401  TROPONINI <0.30 <0.30   BNP (last 3 results) No results found for this basename: PROBNP,  in the last 8760 hours CBG:  Recent Labs Lab 08/16/14 1111 08/16/14 1656 08/16/14 2140 08/17/14 0742  GLUCAP 182* 233* 153* 153*    Recent Results (from the past 240 hour(s))  CULTURE, BLOOD (ROUTINE X 2)     Status: None   Collection Time    08/16/14  7:56 AM      Result Value Ref Range Status   Specimen Description BLOOD   Final   Special Requests NONE   Final   Culture NO GROWTH <24 HRS   Final   Report Status PENDING   Incomplete  CULTURE, BLOOD (ROUTINE X 2)     Status: None   Collection Time    08/16/14  7:58 AM      Result Value Ref Range Status   Specimen Description BLOOD   Final   Special Requests NONE   Final   Culture NO GROWTH <24 HRS   Final   Report Status PENDING   Incomplete     Studies: Dg Chest 1 View  08/17/2014   CLINICAL DATA:  Pneumonia, diminished bowel sounds  EXAM: CHEST - 1 VIEW  COMPARISON:  08/16/2014  FINDINGS: The heart size and mediastinal contours are within normal limits. Mild elevation of the left diaphragm. Both lungs are clear. The visualized skeletal structures are unremarkable.  IMPRESSION: No active disease.   Electronically Signed   By: Kathreen Devoid   On: 08/17/2014 08:13   Ct Head Wo Contrast  08/16/2014   CLINICAL DATA:  Recent fall  EXAM: CT HEAD  WITHOUT CONTRAST  TECHNIQUE: Contiguous axial images were obtained from the base of the skull through the vertex without intravenous contrast.  COMPARISON:  None.  FINDINGS: There is moderate generalized atrophy. There is no mass, hemorrhage, extra-axial fluid collection, or midline shift. There is patchy small vessel disease in the centra semiovale bilaterally. Elsewhere gray-white compartments appear normal. There is no demonstrable acute infarct. The bony calvarium appears intact. The mastoid air cells are clear.  IMPRESSION: Moderate atrophy with mild patchy periventricular small vessel disease. There is no intracranial mass, hemorrhage, or extra-axial fluid.   Electronically Signed   By: Lowella Grip M.D.   On: 08/16/2014 08:22   Dg Chest Portable 1 View  08/16/2014   CLINICAL DATA:  Weakness and generalized aching; hypertension  EXAM: PORTABLE CHEST - 1 VIEW  COMPARISON:  January 14, 2012  FINDINGS: There is  no edema or consolidation. Heart is upper limits normal in size with pulmonary vascularity within normal limits. No adenopathy. There is an old healed fracture of the left clavicle. There appears to be acromioclavicular separation on the right, likely chronic.  IMPRESSION: No edema or consolidation. Probable chronic acromioclavicular separation on the right.   Electronically Signed   By: Lowella Grip M.D.   On: 08/16/2014 08:10    Scheduled Meds: . enoxaparin (LOVENOX) injection  40 mg Subcutaneous Daily  . feeding supplement (GLUCERNA SHAKE)  237 mL Oral BID BM  . insulin aspart  0-5 Units Subcutaneous QHS  . insulin aspart  0-9 Units Subcutaneous TID WC  . sodium chloride  3 mL Intravenous Q12H   Continuous Infusions: . sodium chloride 75 mL/hr at 08/16/14 1821    Principal Problem:   SIRS (systemic inflammatory response syndrome) Active Problems:   Fever   UTI (lower urinary tract infection)   Gross hematuria   Acute encephalopathy   Acute respiratory failure    Leukocytosis, unspecified   Dehydration   Hyponatremia   Frequent falls   Hypertension   Diabetes mellitus without complication   Anemia   Thrombocytosis   Malnutrition of moderate degree   DJD (degenerative joint disease)    Time spent: 35 minutes    [facility] Pager [phone]. If 7PM-7AM, please contact night-coverage at [email or web address], password [facility] 08/17/2014, 9:12 AM  LOS: 1 day

## 2014-08-17 NOTE — Plan of Care (Signed)
Problem: Phase I Progression Outcomes Goal: OOB as tolerated unless otherwise ordered Outcome: Completed/Met Date Met:  08/17/14 PT assessed patient today. Patient is able to get out of bed with assistance and used a walker to move up and down the hall with PT. Patient up to bathroom with staff assistance around 4pm for BM. Urinal still available at bedside. Instructed not to ambulate without staff assistance and bed alarm is on.

## 2014-08-17 NOTE — Evaluation (Signed)
Physical Therapy Evaluation Patient Details Name: ZAHKI HOOGENDOORN MRN: [id number] DOB: September 12, 1930 Today's Date: 08/17/2014   History of Present Illness  AVANEESH PEPITONE is a 80 y.o. male with a past medical history that includes hypertension, diabetes, diverticulitis presents to the emergency department with the chief complaint of generalized weakness with confusion and frequent falls. Initial evaluation in the emergency department reveals a urinary tract infection, hypoxia and possible polyp pharmacy.  Information is obtained from the patient and the daughter who is at the bedside. Daughter reports over the last 4-5 weeks patient has developed generalized weakness unsteady gait with frequent falls. During this time period he was provided with pain medicine for aches he developed after falling and antidepressant for depression he developed. Daughter reports patient returned  home in [place] 2 weeks ago after spending 1 month in [place] with daughter. Over last 3 days he has become more confused and is eating and drinking less. His most recent fall was 5 days ago. He denies dizziness loss of consciousness or hitting his head. He reports "I lose my footing". His family has noted and unsteady gait over the last several weeks. Has been no chest pain palpitations shortness of breath. The daughter does indicate that he has been running in low grade fever intermittently. He denies dysuria hematuria frequency or urgency. He denies abdominal pain nausea vomiting diarrhea or constipation.  Clinical Impression  Pt is an 80 year old male who presents to PT for assessment of functional mobility skills.   Pt reports weakness and increasing falls, with 4-5 falls reported in the past month.  During evaluation, pt was mod (I) with bed mobility skills,  Min guard/supervision for transfers, and min guard for gait of 150 feet.  Pt did require multiple attempts to transfer to stand secondary to generalized weakness.  No overt LOB,  though pt did demonstrate decreased foot clearance and decrease cadence which increases pt fall risk.  Recommend continued PT while in the hospital to address activity tolerance, strengthening, and balance with transition to HHPT at discharge.  No DME recommendations.   Follow Up Recommendations Home health PT    Equipment Recommendations  None recommended by PT       Precautions / Restrictions Precautions Precautions: Fall Restrictions Weight Bearing Restrictions: No      Mobility  Bed Mobility Overal bed mobility: Modified Independent                Transfers Overall transfer level: Needs assistance Equipment used: Rolling walker (2 wheeled) Transfers: Sit to/from [facility] Sit to Stand: Min guard Stand pivot transfers: Supervision       General transfer comment: Multiple attempts to transfer sit -> stand without physical assist from PT  Ambulation/Gait Ambulation/Gait assistance: Min guard Ambulation Distance (Feet): 150 Feet Assistive device: Rolling walker (2 wheeled) Gait Pattern/deviations: Step-through pattern;Decreased dorsiflexion - right;Decreased dorsiflexion - left   Gait velocity interpretation: Below normal speed for age/gender       Balance Overall balance assessment: History of Falls;Needs assistance (Pt reports 4-5 falls in the past month) Sitting-balance support: Feet supported;No upper extremity supported Sitting balance-Leahy Scale: Good     Standing balance support: Bilateral upper extremity supported;During functional activity                                 Pertinent Vitals/Pain Pain Assessment: No/denies pain (No pain, discomfort in buttock from fall last week)  Home Living Family/patient expects to be discharged to:: Private residence Living Arrangements: Spouse/significant other;Children (Lives with wife/daughter) Available Help at Discharge: Family;Available 24 hours/day Type of Home:  House Home Access: Stairs to enter Entrance Stairs-Rails: None Entrance Stairs-Number of Steps: 4-5 Home Layout: Two level;Bed/bath upstairs Home Equipment: Walker - 2 wheels;Walker - standard;Grab bars - tub/shower;Grab bars - toilet Additional Comments: Tub Shower, Handicap height toilet    Prior Function Level of Independence: Independent with assistive device(s)               Hand Dominance   Dominant Hand: Right    Extremity/Trunk Assessment               Lower Extremity Assessment: Generalized weakness         Communication   Communication: No difficulties  Cognition Arousal/Alertness: Awake/alert Behavior During Therapy: WFL for tasks assessed/performed Overall Cognitive Status: No family/caregiver present to determine baseline cognitive functioning (Slow processing)                               Assessment/Plan    PT Assessment Patient needs continued PT services  PT Diagnosis Difficulty walking;Generalized weakness   PT Problem List Decreased strength;Decreased balance;Decreased mobility;Decreased activity tolerance  PT Treatment Interventions Balance training;Gait training;Neuromuscular re-education;Functional mobility training;Therapeutic activities;Therapeutic exercise   PT Goals (Current goals can be found in the Care Plan section) Acute Rehab PT Goals Patient Stated Goal: none stated Time For Goal Achievement: 08/31/14 Potential to Achieve Goals: Good    Frequency Min 3X/week    End of Session Equipment Utilized During Treatment: Gait belt Activity Tolerance: Patient tolerated treatment well Patient left: in chair;with call bell/phone within reach;with chair alarm set           Time: 4665-9935 PT Time Calculation (min): 32 min   Charges:   PT Evaluation $Initial PT Evaluation Tier I: 1 Procedure      Spencer Peterkin 08/17/2014, 11:59 AM

## 2014-08-18 ENCOUNTER — Encounter (HOSPITAL_COMMUNITY): Payer: Self-pay | Admitting: Internal Medicine

## 2014-08-18 DIAGNOSIS — D509 Iron deficiency anemia, unspecified: Secondary | ICD-10-CM

## 2014-08-18 LAB — CBC
HEMATOCRIT: 31 % — AB (ref 39.0–52.0)
Hemoglobin: 10.1 g/dL — ABNORMAL LOW (ref 13.0–17.0)
MCH: 29.9 pg (ref 26.0–34.0)
MCHC: 32.6 g/dL (ref 30.0–36.0)
MCV: 91.7 fL (ref 78.0–100.0)
Platelets: 446 10*3/uL — ABNORMAL HIGH (ref 150–400)
RBC: 3.38 MIL/uL — AB (ref 4.22–5.81)
RDW: 13.1 % (ref 11.5–15.5)
WBC: 10.2 10*3/uL (ref 4.0–10.5)

## 2014-08-18 LAB — BASIC METABOLIC PANEL
Anion gap: 11 (ref 5–15)
BUN: 15 mg/dL (ref 6–23)
CALCIUM: 9 mg/dL (ref 8.4–10.5)
CO2: 26 mEq/L (ref 19–32)
CREATININE: 1.03 mg/dL (ref 0.50–1.35)
Chloride: 102 mEq/L (ref 96–112)
GFR calc Af Amer: 76 mL/min — ABNORMAL LOW (ref >90)
GFR calc non Af Amer: 66 mL/min — ABNORMAL LOW (ref >90)
Glucose, Bld: 150 mg/dL — ABNORMAL HIGH (ref 70–99)
Potassium: 4.3 mEq/L (ref 3.7–5.3)
Sodium: 139 mEq/L (ref 137–147)

## 2014-08-18 LAB — GLUCOSE, CAPILLARY
GLUCOSE-CAPILLARY: 144 mg/dL — AB (ref 70–99)
GLUCOSE-CAPILLARY: 215 mg/dL — AB (ref 70–99)

## 2014-08-18 LAB — FERRITIN: FERRITIN: 929 ng/mL — AB (ref 22–322)

## 2014-08-18 LAB — FOLATE: FOLATE: 9.5 ng/mL

## 2014-08-18 MED ORDER — GLUCERNA SHAKE PO LIQD: 237.0000 mL | Freq: Two times a day (BID) | ORAL | Status: DC

## 2014-08-18 MED ORDER — FERROUS SULFATE 325 (65 FE) MG PO TABS: 325.0000 mg | ORAL_TABLET | Freq: Two times a day (BID) | ORAL | Status: DC

## 2014-08-18 MED ORDER — PANTOPRAZOLE SODIUM 40 MG PO TBEC: 40.0000 mg | DELAYED_RELEASE_TABLET | Freq: Every day | ORAL | Status: DC

## 2014-08-18 MED ORDER — HYDROCODONE-ACETAMINOPHEN 5-325 MG PO TABS
1.0000 | ORAL_TABLET | Freq: Three times a day (TID) | ORAL | Status: DC | PRN
Start: 2014-08-18 — End: 2015-05-30

## 2014-08-18 MED ORDER — DIAZEPAM 2 MG PO TABS: 2.0000 mg | ORAL_TABLET | Freq: Two times a day (BID) | ORAL | Status: DC | PRN

## 2014-08-18 MED ORDER — VENLAFAXINE HCL ER 37.5 MG PO CP24: 37.5000 mg | ORAL_CAPSULE | Freq: Every day | ORAL | Status: DC

## 2014-08-18 MED ORDER — GLIMEPIRIDE 2 MG PO TABS: 1.0000 mg | ORAL_TABLET | Freq: Every day | ORAL | Status: DC

## 2014-08-18 MED ORDER — GLIMEPIRIDE 1 MG PO TABS: 1.0000 mg | ORAL_TABLET | Freq: Every day | ORAL | Status: DC | PRN

## 2014-08-18 MED ORDER — CEFUROXIME AXETIL 500 MG PO TABS: 500.0000 mg | ORAL_TABLET | Freq: Two times a day (BID) | ORAL | Status: DC

## 2014-08-18 MED ORDER — GLIMEPIRIDE 1 MG PO TABS: 1.0000 mg | ORAL_TABLET | Freq: Every day | ORAL | Status: DC

## 2014-08-18 MED ORDER — CEFUROXIME AXETIL 250 MG PO TABS: 250.0000 mg | ORAL_TABLET | Freq: Two times a day (BID) | ORAL | Status: DC

## 2014-08-18 NOTE — Care Management Utilization Note (Signed)
UR completed 

## 2014-08-18 NOTE — Discharge Summary (Signed)
Physician Discharge Summary  DARIS HARKINS JHE:[id number] DOB: 1930/08/27 DOA: 08/16/2014  PCP: Margaretmary Eddy, MD  Admit date: 08/16/2014 Discharge date: 08/18/2014  Time spent: 40 minutes  Recommendations for Outpatient Follow-up:  1. Dr Sharlett Iles PCP in 1 week for evaluation of mental status, pain management and functional status. Recommend CBC to track Hg and urinalysis to confirm resolution of UTI and hematuria. Evaluate CBG readings for optimal control.  Consider surveillance colonoscopy in pt with anemia, hx PUD and hx colon cancer.  2. Home health PT for strength and endurence   Discharge Diagnoses:  Principal Problem:   SIRS (systemic inflammatory response syndrome) Active Problems:   Fever   UTI (lower urinary tract infection)   Gross hematuria   Acute encephalopathy   Acute respiratory failure   Leukocytosis, unspecified   Dehydration   Hyponatremia   Frequent falls   Hypertension   Diabetes mellitus without complication   Anemia, iron deficiency   Thrombocytosis   Malnutrition of moderate degree   DJD (degenerative joint disease)   Discharge Condition: stable  Diet recommendation: carb modified heart healthy   Filed Weights   08/16/14 0645 08/16/14 1031  Weight: 92.534 kg (204 lb) 94.348 kg (208 lb)    History of present illness:  Jared Bridges is a 80 y.o. male with a past medical history that includes hypertension, diabetes, diverticulitis presented to the emergency department on 08/16/14 with the chief complaint of generalized weakness with confusion and frequent falls. Initial evaluation in the emergency department revealed a urinary tract infection, hypoxia and possible poly pharmacy.  Information  obtained from the patient and the daughter who was at the bedside. Daughter reported over the previous 4-5 weeks patient developed generalized weakness unsteady gait with frequent falls. During this time period he was provided with pain medicine for aches he  developed after falling and antidepressant for depression he developed. Daughter reported patient returned home in [place] 2 weeks prior to presentation after spending 1 month in [place] with daughter. Over previous 3 days he had become more confused and was eating and drinking less. His most recent fall was 5 days prior. He denied dizziness loss of consciousness or hitting his head. He reported "I lose my footing". His family  noted an unsteady gait over the last several weeks. No chest pain palpitations shortness of breath. The daughter did indicate that he had been running  low grade fever intermittently. He denied dysuria hematuria frequency or urgency. He denied abdominal pain nausea vomiting diarrhea or constipation.   Workup in the emergency room included a basic metabolic panel significant for sodium of 133 chloride of 93 serum glucose of 176. Complete blood count significant for a leukocytosis of 14.5 hemoglobin 11.1 platelets 443. Troponin was -0.30. Lactic acid 1.1. Urinalysis with rare bacteria, RBCs too numerous to count, wbc's 3-6. CT of the head revealed moderate atrophy with mild patchy periventricular small vessel disease. There is no intracranial mass, hemorrhage, or extra-axial fluid. Chest xray no edema or consolidation. Probable chronic acromioclavicular separation on the right. EKG SR.  In ED his temperature 100.7, respirations 35, oxygen saturation level 87% on room air at rest, mild tachycardia, blood pressure stable.    Hospital Course:  Principal Problem:  SIRS (systemic inflammatory response syndrome);  fever, leukocytosis likely related to urinary tract infection. He was admitted and provided with IV fluids and vancomycin and cefepime.  Repeat chest xray without infiltrate. He remained hemodynamically stable. Blood cultures with no growth. Urine cultures no growth.  He quickly improved with treatment. Max temp at discharge 98.2. Leukocytosis resolved. Urine output good. Will  discharge with ceftin to complete 7 day course.   Active Problems:  Hypoxemia: resolved at discharge. Initial and repeat chest xray with no consolidation or infiltrate. Likely related to acute illness. At discharge oxygen saturation level 96% on room air.    UTI (lower urinary tract infection): Urine culture with no growth. Urine output good. Antibiotics as above.   Acute encephalopathy: resolved at discharge.  CT head without acute abnormality. Neuro exam without focal defecits. TSH and B12 within limits of normal. Likely multifactorial i.e. Dehydration, UTI, medications.Home medications held i.e. lyrica, norco and effexor. Effexor resumed at lower dose. Valium available prn. During hospitalization he required no valium or pain medicine. Will discharge with reduced dose of pain medicine as well as reduced dose of valium.    Gross hematuria: Likely related to Foley. Urine output good. Hg stable. Recommend UA in 1 week at follow up visit with PCP   Dehydration: Resolved at discharge. Provided with IV fluids. At discharge taking po fluids well.   Hyponatremia: resolved at discharge.  Related to above.   Frequent falls: Frequency of falls coincides with the timing of introducing pain medicine and antidepressants. Neuro exam benign CT of the head without acute findings. Home medications adjusted as indicated above. Evaluated by physical therapy who recommend [facility] PT for strength and endurence. Patient ambulated in hall with minimal assist and walker at discharge. Home health arrangements made   Hypertension: Fair control. Is not on any antihypertensives.   Diabetes mellitus without complication: Diet controlled. He was on metformin in the past but has been taken off due to nausea. Hemoglobin A1c 7.6. CBG range 144-215. Will provide amaryl 1 mg daily as needed if am CBG >150. Recommend close follow up with PCP. For monitoring.  Follow carb modified diet.    Depression. Not suicidal. Stable at baseline.  Effexor at lower dose resumed. Patient does have an appointment with psychiatry on the outpatient basis   Anemia. Hg 10.1 on discharge from 11.1 after hydration. Hx of GI bleed. No aspirin or NSAID use per patient and family. Anemia panel with iron <10, ferritin 929 and RBC 3.22. Hx PUD. Continue PPI at discharge. Provide iron supplement and consider surveillance colonoscopy as OP.   ?dysphagia. Patient hx uvulectomy 2004. Reportedly with some worsening aspiration tendency of late. ST  Evaluated for aspiration and opined mild risk. Recommended dysphagia 2 thin liquid diet    Procedures: none  Consultations:  none  Discharge Exam: Filed Vitals:   08/18/14 1430  BP: 156/63  Pulse: 72  Temp: 98.3 F (36.8 C)  Resp: 18    General: up in chair appears comfortable Cardiovascular: S1 and S2. No gallup or rub No LE edema Respiratory: normal effort mild non-productive cough  Discharge Instructions You were cared for by a hospitalist during your hospital stay. If you have any questions about your discharge medications or the care you received while you were in the hospital after you are discharged, you can call the unit and asked to speak with the hospitalist on call if the hospitalist that took care of you is not available. Once you are discharged, your primary care physician will handle any further medical issues. Please note that NO REFILLS for any discharge medications will be authorized once you are discharged, as it is imperative that you return to your primary care physician (or establish a relationship with a primary care physician  if you do not have one) for your aftercare needs so that they can reassess your need for medications and monitor your lab values.   Current Discharge Medication List    START taking these medications   Details  cefUROXime (CEFTIN) 250 MG tablet Take 1 tablet (250 mg total) by mouth 2 (two) times daily with a meal. Qty: 10 tablet, Refills: 0    feeding  supplement, GLUCERNA SHAKE, (GLUCERNA SHAKE) LIQD Take 237 mLs by mouth 2 (two) times daily between meals. Refills: 0    ferrous sulfate 325 (65 FE) MG tablet Take 1 tablet (325 mg total) by mouth 2 (two) times daily with a meal.    glimepiride (AMARYL) 1 MG tablet Take 1 tablet (1 mg total) by mouth daily as needed. Take if CBG >150 in the morning. Qty: 30 tablet, Refills: 0    pantoprazole (PROTONIX) 40 MG tablet Take 1 tablet (40 mg total) by mouth daily. Qty: 30 tablet, Refills: 1      CONTINUE these medications which have CHANGED   Details  diazepam (VALIUM) 2 MG tablet Take 1 tablet (2 mg total) by mouth every 12 (twelve) hours as needed for anxiety. Qty: 30 tablet, Refills: 0    HYDROcodone-acetaminophen (NORCO/VICODIN) 5-325 MG per tablet Take 1 tablet by mouth every 8 (eight) hours as needed for moderate pain. Qty: 30 tablet, Refills: 0    venlafaxine XR (EFFEXOR-XR) 37.5 MG 24 hr capsule Take 1 capsule (37.5 mg total) by mouth daily with breakfast. Qty: 30 capsule, Refills: 1      STOP taking these medications     pregabalin (LYRICA) 75 MG capsule        Allergies  Allergen Reactions  . Cymbalta [Duloxetine Hcl]   . Iodine   . Lisinopril   . Neurontin [Gabapentin]   . Zoloft [Sertraline Hcl]    Follow-up Information   Follow up with PATERSON,ROBERT, MD. Schedule an appointment as soon as possible for a visit in 1 week. (for evaluation of mental status and pain management as medications adjusted)    Specialty:  Internal Medicine   Contact information:   [address] [place] [address] [phone]        The results of significant diagnostics from this hospitalization (including imaging, microbiology, ancillary and laboratory) are listed below for reference.    Significant Diagnostic Studies: Dg Chest 1 View  08/17/2014   CLINICAL DATA:  Pneumonia, diminished bowel sounds  EXAM: CHEST - 1 VIEW  COMPARISON:  08/16/2014  FINDINGS: The  heart size and mediastinal contours are within normal limits. Mild elevation of the left diaphragm. Both lungs are clear. The visualized skeletal structures are unremarkable.  IMPRESSION: No active disease.   Electronically Signed   By: Kathreen Devoid   On: 08/17/2014 08:13   Ct Head Wo Contrast  08/16/2014   CLINICAL DATA:  Recent fall  EXAM: CT HEAD WITHOUT CONTRAST  TECHNIQUE: Contiguous axial images were obtained from the base of the skull through the vertex without intravenous contrast.  COMPARISON:  None.  FINDINGS: There is moderate generalized atrophy. There is no mass, hemorrhage, extra-axial fluid collection, or midline shift. There is patchy small vessel disease in the centra semiovale bilaterally. Elsewhere gray-white compartments appear normal. There is no demonstrable acute infarct. The bony calvarium appears intact. The mastoid air cells are clear.  IMPRESSION: Moderate atrophy with mild patchy periventricular small vessel disease. There is no intracranial mass, hemorrhage, or extra-axial fluid.   Electronically Signed  By: Lowella Grip M.D.   On: 08/16/2014 08:22   Dg Chest Portable 1 View  08/16/2014   CLINICAL DATA:  Weakness and generalized aching; hypertension  EXAM: PORTABLE CHEST - 1 VIEW  COMPARISON:  January 14, 2012  FINDINGS: There is no edema or consolidation. Heart is upper limits normal in size with pulmonary vascularity within normal limits. No adenopathy. There is an old healed fracture of the left clavicle. There appears to be acromioclavicular separation on the right, likely chronic.  IMPRESSION: No edema or consolidation. Probable chronic acromioclavicular separation on the right.   Electronically Signed   By: Lowella Grip M.D.   On: 08/16/2014 08:10    Microbiology: Recent Results (from the past 240 hour(s))  CULTURE, BLOOD (ROUTINE X 2)     Status: None   Collection Time    08/16/14  7:56 AM      Result Value Ref Range Status   Specimen Description BLOOD  LEFT ARM   Final   Special Requests BOTTLES DRAWN AEROBIC AND ANAEROBIC 8CC   Final   Culture NO GROWTH 1 DAY   Final   Report Status PENDING   Incomplete  CULTURE, BLOOD (ROUTINE X 2)     Status: None   Collection Time    08/16/14  7:58 AM      Result Value Ref Range Status   Specimen Description BLOOD LEFT HAND DRAWN BY RN   Final   Special Requests BOTTLES DRAWN AEROBIC AND ANAEROBIC 8CC   Final   Culture NO GROWTH 1 DAY   Final   Report Status PENDING   Incomplete  URINE CULTURE     Status: None   Collection Time    08/16/14  8:48 AM      Result Value Ref Range Status   Specimen Description URINE, CATHETERIZED   Final   Special Requests NONE   Final   Culture  Setup Time     Final   Value: 08/16/2014 11:10     Performed at [facility]     Final   Value: NO GROWTH     Performed at [facility]   Culture     Final   Value: NO GROWTH     Performed at [facility]   Report Status 08/17/2014 FINAL   Final     Labs: Basic Metabolic Panel:  Recent Labs Lab 08/16/14 0658 08/17/14 0544 08/18/14 0630  NA 133* 136* 139  K 4.7 4.3 4.3  CL 93* 101 102  CO2 27 26 26   GLUCOSE 176* 159* 150*  BUN 22 18 15   CREATININE 1.31 1.14 1.03  CALCIUM 9.1 8.5 9.0   Liver Function Tests: No results found for this basename: AST, ALT, ALKPHOS, BILITOT, PROT, ALBUMIN,  in the last 168 hours No results found for this basename: LIPASE, AMYLASE,  in the last 168 hours No results found for this basename: AMMONIA,  in the last 168 hours CBC:  Recent Labs Lab 08/16/14 0658 08/17/14 0544 08/18/14 0630  WBC 14.5* 11.3* 10.2  NEUTROABS 12.1*  --   --   HGB 11.1* 9.9* 10.1*  HCT 34.5* 30.7* 31.0*  MCV 93.2 91.9 91.7  PLT 442* 408* 446*   Cardiac Enzymes:  Recent Labs Lab 08/16/14 0658 08/16/14 1401  TROPONINI <0.30 <0.30   BNP: BNP (last 3 results) No results found for this basename: PROBNP,  in the last 8760 hours CBG:  Recent  Labs Lab 08/17/14  1131 08/17/14 1628 08/17/14 2145 08/18/14 0757 08/18/14 1131  GLUCAP 171* 147* 131* 144* 215*       Signed:  Keiland Pickering M  [facility] 08/18/2014, 4:04 PM

## 2014-08-18 NOTE — Discharge Summary (Signed)
The patient was seen and examined. He was discussed with nurse practitioner, Ms. Renard Hamper. Agree with her assessment and plan.  Discharge diagnoses:  1. SIRS, possibly secondary to urinary tract infection. 2. Possible urinary tract infection, though the urine culture was negative. 3. Gross hematuria, thought to be secondary to the in and out catheter and/or urinary tract infection. Recommend followup urinalysis in the outpatient setting. 4. Transient hypoxia query acute respiratory failure with hypoxia with no known etiology other than hypoventilation from generalized weakness. Resolved. 5. Acute encephalopathy/generalized weakness. Etiology likely multifactorial including psychotropic medications, urinary tract infection, dehydration, and anemia. 7. Frequent falls. Etiology likely multifactorial including degenerative joint disease in his knees, his hips, and back with previous lumbar surgery. Also contribution from dehydration, urinary tract infection, anemia, et Ronney Asters. Home health physical therapy ordered. 6. Hyponatremia secondary to dehydration. Resolved with IV fluids. 7. Iron deficiency anemia. He was started on ferrous sulfite supplementation. Recommend followup with his gastroenterologist for EGD and/or colonoscopy given his history of peptic ulcer disease and colon cancer. 8. Depression. Dose of Effexor decreased because of potential side effects with higher dose.. Further monitoring and management per his psychiatry. 9. Type 2 diabetes mellitus without complication. Previously diet controlled. Hemoglobin A1c 7.6 during the hospitalization, so when necessary Amaryl at 1 mg daily if his blood glucoses greater than 150 prescribed.

## 2014-08-18 NOTE — Care Management Note (Signed)
    Page 1 of 1   08/18/2014     5:40:59 PM CARE MANAGEMENT NOTE 08/18/2014  Patient:  Jared Bridges, Jared Bridges   Account Number:  [id number]  Date Initiated:  08/18/2014  Documentation initiated by:  Vladimir Creeks  Subjective/Objective Assessment:   Admitted with PNA. Pt is from home with wife and daughter, and plans to return home at D/C.     Action/Plan:   pt has [facility] active in the home and would like to continue with them. He needs a PCP here and Dr Anastasio Champion can take him as a new pt on 09/28/09:00am   Anticipated DC Date:  08/18/2014   Anticipated DC Plan:  [place]  CM consult      [facility] Choice  HOME HEALTH   Choice offered to / List presented to:  C-1 Patient        [place] arranged  HH-1 RN  HH-10 DISEASE MANAGEMENT  HH-2 PT      Status of service:  Completed, signed off Medicare Important Message given?  YES (If response is "NO", the following Medicare IM given date fields will be blank) Date Medicare IM given:  08/18/2014 Medicare IM given by:  Vladimir Creeks Date Additional Medicare IM given:   Additional Medicare IM given by:    Discharge Disposition:  [place]  Per UR Regulation:  Reviewed for med. necessity/level of care/duration of stay  If discussed at [place] of Stay Meetings, dates discussed:    Comments:  08/18/14 [place] RN/CM

## 2014-08-21 LAB — CULTURE, BLOOD (ROUTINE X 2)
CULTURE: NO GROWTH
Culture: NO GROWTH

## 2014-09-12 DIAGNOSIS — M353 Polymyalgia rheumatica: Secondary | ICD-10-CM | POA: Insufficient documentation

## 2014-10-15 ENCOUNTER — Emergency Department (HOSPITAL_COMMUNITY): Payer: Medicare Other

## 2014-10-15 ENCOUNTER — Encounter (HOSPITAL_COMMUNITY): Payer: Self-pay | Admitting: Cardiology

## 2014-10-15 ENCOUNTER — Emergency Department (HOSPITAL_COMMUNITY)
Admission: EM | Admit: 2014-10-15 | Discharge: 2014-10-15 | Disposition: A | Discharge status: Home / Self Care | Payer: Medicare Other | Attending: Emergency Medicine | Admitting: Emergency Medicine

## 2014-10-15 DIAGNOSIS — R05 Cough: Secondary | ICD-10-CM | POA: Diagnosis present

## 2014-10-15 DIAGNOSIS — Z7952 Long term (current) use of systemic steroids: Secondary | ICD-10-CM | POA: Diagnosis not present

## 2014-10-15 DIAGNOSIS — Z79899 Other long term (current) drug therapy: Secondary | ICD-10-CM | POA: Diagnosis not present

## 2014-10-15 DIAGNOSIS — Z87891 Personal history of nicotine dependence: Secondary | ICD-10-CM | POA: Insufficient documentation

## 2014-10-15 DIAGNOSIS — Z8719 Personal history of other diseases of the digestive system: Secondary | ICD-10-CM | POA: Insufficient documentation

## 2014-10-15 DIAGNOSIS — R059 Cough, unspecified: Secondary | ICD-10-CM

## 2014-10-15 DIAGNOSIS — Z792 Long term (current) use of antibiotics: Secondary | ICD-10-CM | POA: Diagnosis not present

## 2014-10-15 DIAGNOSIS — J209 Acute bronchitis, unspecified: Secondary | ICD-10-CM | POA: Diagnosis not present

## 2014-10-15 DIAGNOSIS — E119 Type 2 diabetes mellitus without complications: Secondary | ICD-10-CM | POA: Diagnosis not present

## 2014-10-15 DIAGNOSIS — Z85038 Personal history of other malignant neoplasm of large intestine: Secondary | ICD-10-CM | POA: Diagnosis not present

## 2014-10-15 DIAGNOSIS — D509 Iron deficiency anemia, unspecified: Secondary | ICD-10-CM | POA: Diagnosis not present

## 2014-10-15 DIAGNOSIS — G8929 Other chronic pain: Secondary | ICD-10-CM | POA: Insufficient documentation

## 2014-10-15 LAB — CBG MONITORING, ED: Glucose-Capillary: 120 mg/dL — ABNORMAL HIGH (ref 70–99)

## 2014-10-15 MED ORDER — AZITHROMYCIN 250 MG PO TABS: ORAL_TABLET | ORAL | Status: DC

## 2014-10-15 NOTE — ED Notes (Addendum)
Cough and chest congestion since wed.  States he has a green colored  productive cough.  Low grade fever

## 2014-10-15 NOTE — ED Provider Notes (Signed)
CSN: [id number]     Arrival date & time 10/15/14  0915 History   First MD Initiated Contact with Patient 10/15/14 [phone]     Chief Complaint  Patient presents with  . Cough     (Consider location/radiation/quality/duration/timing/severity/associated sxs/prior Treatment) The history is provided by the patient and the spouse.   Jared Bridges is a 80 y.o. male with a history of diet-controlled diabetes, hypertension and PUD presenting with a four-day history of cough which has developed into purulent green sputum production.  His also had nasal congestion and S had several doses of a prescription nose spray (doesn't know the name) and developed bleeding from his right nostril this morning which resolved after gentle pressure.  He denies shortness of breath, chest pain, peripheral edema.  He states he may have some mild intermittent wheezing but not currently.  He has a prior history of pneumonia 2 and is concerned for this possible diagnosis today.  He has taken no medications for his cough prior to arrival.     Past Medical History  Diagnosis Date  . Chronic headaches   . Diabetes mellitus without complication   . Hypertension   . Cancer   . Colon cancer   . Diverticulitis   . Syncope   . Hypercholesteremia   . GI bleed     seen by rehman 1998  . Peptic ulcer disease Early 2000 late 1990s    "Bleeding ulcer".  . Anemia, iron deficiency 08/16/2014   Past Surgical History  Procedure Laterality Date  . Uvulectomy  2004   History reviewed. No pertinent family history. History  Substance Use Topics  . Smoking status: Former Smoker    Quit date: 12/01/1956  . Smokeless tobacco: Not on file  . Alcohol Use: No    Review of Systems  Constitutional: Negative for fever and chills.  HENT: Positive for congestion and nosebleeds. Negative for sore throat.   Eyes: Negative.   Respiratory: Positive for cough and wheezing. Negative for chest tightness, shortness of breath and stridor.    Cardiovascular: Negative for chest pain and leg swelling.  Gastrointestinal: Negative for nausea and abdominal pain.  Genitourinary: Negative.   Musculoskeletal: Negative for joint swelling, arthralgias and neck pain.  Skin: Negative.  Negative for rash and wound.  Neurological: Negative for dizziness, weakness, light-headedness, numbness and headaches.  Psychiatric/Behavioral: Negative.       Allergies  Cymbalta; Iodine; Lisinopril; Neurontin; and Zoloft  Home Medications   Prior to Admission medications   Medication Sig Start Date End Date Taking? Authorizing Provider  acetaminophen (TYLENOL) 500 MG tablet Take 1,000 mg by mouth every 6 (six) hours as needed for moderate pain.   Yes Historical Provider, MD  diazepam (VALIUM) 5 MG tablet Take 2.5 mg by mouth at bedtime as needed (SLEEP).    Yes Historical Provider, MD  eszopiclone (LUNESTA) 2 MG TABS tablet Take 1 tablet by mouth at bedtime. 09/17/14  Yes Historical Provider, MD  ferrous sulfate 325 (65 FE) MG tablet Take 1 tablet (325 mg total) by mouth 2 (two) times daily with a meal. Patient taking differently: Take 325 mg by mouth daily with breakfast.  08/18/14  Yes Rexene Alberts, MD  glimepiride (AMARYL) 1 MG tablet Take 1 tablet (1 mg total) by mouth daily as needed. Take if CBG >150 in the morning. 08/19/14  Yes Radene Gunning, NP  HYDROcodone-acetaminophen (NORCO/VICODIN) 5-325 MG per tablet Take 1 tablet by mouth every 8 (eight) hours as needed for moderate  pain. Patient taking differently: Take 0.5-1 tablets by mouth every 6 (six) hours as needed (MIGRAINE).  08/18/14  Yes Lezlie Octave Black, NP  LYRICA 50 MG capsule Take 1 capsule by mouth 2 (two) times daily. 10/03/14  Yes Historical Provider, MD  oxymetazoline (AFRIN) 0.05 % nasal spray Place 1 spray into both nostrils 2 (two) times daily as needed for congestion.   Yes Historical Provider, MD  pantoprazole (PROTONIX) 40 MG tablet Take 1 tablet (40 mg total) by mouth daily. 08/18/14   Yes Lezlie Octave Black, NP  predniSONE (DELTASONE) 5 MG tablet Take 3 tablets by mouth daily. 10/03/14  Yes Historical Provider, MD  tamsulosin (FLOMAX) 0.4 MG CAPS capsule Take 1 capsule by mouth daily. 10/12/14  Yes Historical Provider, MD  venlafaxine (EFFEXOR) 37.5 MG tablet Take 2 tablets by mouth 2 (two) times daily. 08/30/14  Yes Historical Provider, MD  azithromycin (ZITHROMAX Z-PAK) 250 MG tablet Take 2 tablets by mouth on day one followed by one tablet daily for 4 days. 10/15/14   Evalee Jefferson, PA-C  cefUROXime (CEFTIN) 250 MG tablet Take 1 tablet (250 mg total) by mouth 2 (two) times daily with a meal. Patient not taking: Reported on 10/15/2014 08/18/14   Radene Gunning, NP  diazepam (VALIUM) 2 MG tablet Take 1 tablet (2 mg total) by mouth every 12 (twelve) hours as needed for anxiety. Patient not taking: Reported on 10/15/2014 08/18/14   Radene Gunning, NP  feeding supplement, GLUCERNA SHAKE, (GLUCERNA SHAKE) LIQD Take 237 mLs by mouth 2 (two) times daily between meals. Patient not taking: Reported on 10/15/2014 08/18/14   Radene Gunning, NP  venlafaxine XR (EFFEXOR-XR) 37.5 MG 24 hr capsule Take 1 capsule (37.5 mg total) by mouth daily with breakfast. Patient not taking: Reported on 10/15/2014 08/18/14   Radene Gunning, NP   BP 155/65 mmHg  Pulse 70  Temp(Src) 98.2 F (36.8 C) (Oral)  Resp 16  Ht 6\' 2"  (1.88 m)  Wt 194 lb (87.998 kg)  BMI 24.90 kg/m2  SpO2 95% Physical Exam  Constitutional: He appears well-developed and well-nourished.  HENT:  Head: Normocephalic and atraumatic.  Nose: Mucosal edema present. No epistaxis.  Eyes: Conjunctivae are normal.  Neck: Normal range of motion.  Cardiovascular: Normal rate, regular rhythm, normal heart sounds and intact distal pulses.   Pulmonary/Chest: Effort normal. No respiratory distress. He has no wheezes. He has rhonchi in the right lower field. He has no rales. He exhibits no tenderness.  Few rhonchi right base, clears with cough.   Abdominal: Soft. Bowel sounds are normal. There is no tenderness.  Musculoskeletal: Normal range of motion. He exhibits no edema.  Neurological: He is alert.  Skin: Skin is warm and dry.  Psychiatric: He has a normal mood and affect.  Nursing note and vitals reviewed.   ED Course  Procedures (including critical care time) Labs Review Labs Reviewed  CBG MONITORING, ED - Abnormal; Notable for the following:    Glucose-Capillary 120 (*)    All other components within normal limits    Imaging Review Dg Chest 2 View  10/15/2014   CLINICAL DATA:  Acute cough, hypertension, recent pneumonia  EXAM: CHEST - 2 VIEW  COMPARISON:  08/17/2014  FINDINGS: Mild hyperinflation compatible with background COPD/ emphysema. Normal heart size vascularity. No superimposed pneumonia, collapse or consolidation. Negative for edema, more effusion or pneumothorax. Trachea midline. Atherosclerosis of the aorta. Degenerative changes of the spine.  IMPRESSION: Stable hyperinflation, suspect COPD/emphysema  No superimposed  acute process.   Electronically Signed   By: Daryll Brod M.D.   On: 10/15/2014 10:56     EKG Interpretation None      MDM   Final diagnoses:  Cough  Acute bronchitis, unspecified organism    Patients labs and/or radiological studies were viewed and considered during the medical decision making and disposition process. Pt was also seen by Dr Roderic Palau prior to dc home.  Abx  zithromax prescribed, given age and past hx, concern for possible early lung infection not yet visible on xray.   encouraged rest, f.u with pcp if sx persist or worsen.    Evalee Jefferson, PA-C 10/15/14 [place], MD 10/16/14 [phone]

## 2014-10-15 NOTE — Discharge Instructions (Signed)

## 2014-11-10 ENCOUNTER — Encounter (HOSPITAL_COMMUNITY): Payer: Self-pay | Admitting: Emergency Medicine

## 2014-11-10 ENCOUNTER — Emergency Department (HOSPITAL_COMMUNITY)
Admission: EM | Admit: 2014-11-10 | Discharge: 2014-11-10 | Disposition: A | Discharge status: Home / Self Care | Payer: Medicare Other | Attending: Emergency Medicine | Admitting: Emergency Medicine

## 2014-11-10 DIAGNOSIS — Z791 Long term (current) use of non-steroidal anti-inflammatories (NSAID): Secondary | ICD-10-CM | POA: Insufficient documentation

## 2014-11-10 DIAGNOSIS — R42 Dizziness and giddiness: Secondary | ICD-10-CM | POA: Insufficient documentation

## 2014-11-10 DIAGNOSIS — Z87891 Personal history of nicotine dependence: Secondary | ICD-10-CM | POA: Diagnosis not present

## 2014-11-10 DIAGNOSIS — I10 Essential (primary) hypertension: Secondary | ICD-10-CM | POA: Diagnosis not present

## 2014-11-10 DIAGNOSIS — Z8711 Personal history of peptic ulcer disease: Secondary | ICD-10-CM | POA: Insufficient documentation

## 2014-11-10 DIAGNOSIS — G8929 Other chronic pain: Secondary | ICD-10-CM | POA: Diagnosis not present

## 2014-11-10 DIAGNOSIS — Z792 Long term (current) use of antibiotics: Secondary | ICD-10-CM | POA: Insufficient documentation

## 2014-11-10 DIAGNOSIS — Z7952 Long term (current) use of systemic steroids: Secondary | ICD-10-CM | POA: Insufficient documentation

## 2014-11-10 DIAGNOSIS — I951 Orthostatic hypotension: Secondary | ICD-10-CM | POA: Diagnosis not present

## 2014-11-10 DIAGNOSIS — Z85038 Personal history of other malignant neoplasm of large intestine: Secondary | ICD-10-CM | POA: Diagnosis not present

## 2014-11-10 DIAGNOSIS — Z8719 Personal history of other diseases of the digestive system: Secondary | ICD-10-CM | POA: Diagnosis not present

## 2014-11-10 DIAGNOSIS — E119 Type 2 diabetes mellitus without complications: Secondary | ICD-10-CM | POA: Insufficient documentation

## 2014-11-10 DIAGNOSIS — D649 Anemia, unspecified: Secondary | ICD-10-CM | POA: Insufficient documentation

## 2014-11-10 DIAGNOSIS — Z79899 Other long term (current) drug therapy: Secondary | ICD-10-CM | POA: Diagnosis not present

## 2014-11-10 LAB — CBC WITH DIFFERENTIAL/PLATELET
BASOS PCT: 0 % (ref 0–1)
Basophils Absolute: 0 10*3/uL (ref 0.0–0.1)
EOS ABS: 0.1 10*3/uL (ref 0.0–0.7)
Eosinophils Relative: 1 % (ref 0–5)
HCT: 40.9 % (ref 39.0–52.0)
HEMOGLOBIN: 13.3 g/dL (ref 13.0–17.0)
Lymphocytes Relative: 7 % — ABNORMAL LOW (ref 12–46)
Lymphs Abs: 1 10*3/uL (ref 0.7–4.0)
MCH: 30.9 pg (ref 26.0–34.0)
MCHC: 32.5 g/dL (ref 30.0–36.0)
MCV: 94.9 fL (ref 78.0–100.0)
MONOS PCT: 7 % (ref 3–12)
Monocytes Absolute: 0.9 10*3/uL (ref 0.1–1.0)
NEUTROS PCT: 85 % — AB (ref 43–77)
Neutro Abs: 11.7 10*3/uL — ABNORMAL HIGH (ref 1.7–7.7)
Platelets: 237 10*3/uL (ref 150–400)
RBC: 4.31 MIL/uL (ref 4.22–5.81)
RDW: 15.6 % — ABNORMAL HIGH (ref 11.5–15.5)
WBC: 13.7 10*3/uL — ABNORMAL HIGH (ref 4.0–10.5)

## 2014-11-10 LAB — URINALYSIS, ROUTINE W REFLEX MICROSCOPIC
Bilirubin Urine: NEGATIVE
GLUCOSE, UA: NEGATIVE mg/dL
Ketones, ur: NEGATIVE mg/dL
Leukocytes, UA: NEGATIVE
Nitrite: NEGATIVE
PROTEIN: NEGATIVE mg/dL
Specific Gravity, Urine: 1.015 (ref 1.005–1.030)
Urobilinogen, UA: 0.2 mg/dL (ref 0.0–1.0)
pH: 6 (ref 5.0–8.0)

## 2014-11-10 LAB — BASIC METABOLIC PANEL
Anion gap: 13 (ref 5–15)
BUN: 30 mg/dL — ABNORMAL HIGH (ref 6–23)
CO2: 27 mEq/L (ref 19–32)
Calcium: 9.3 mg/dL (ref 8.4–10.5)
Chloride: 98 mEq/L (ref 96–112)
Creatinine, Ser: 1.06 mg/dL (ref 0.50–1.35)
GFR calc non Af Amer: 63 mL/min — ABNORMAL LOW (ref >90)
GFR, EST AFRICAN AMERICAN: 73 mL/min — AB (ref >90)
GLUCOSE: 156 mg/dL — AB (ref 70–99)
POTASSIUM: 4.4 meq/L (ref 3.7–5.3)
Sodium: 138 mEq/L (ref 137–147)

## 2014-11-10 LAB — URINE MICROSCOPIC-ADD ON

## 2014-11-10 LAB — TROPONIN I: Troponin I: <0.3 ng/mL (ref <0.30)

## 2014-11-10 NOTE — ED Notes (Signed)
Discharge instructions reviewed with pt, questions answered. Pt verbalized understanding.  

## 2014-11-10 NOTE — ED Provider Notes (Signed)
CSN: [id number]     Arrival date & time 11/10/14  1407 History   First MD Initiated Contact with Patient 11/10/14 1513     Chief Complaint  Patient presents with  . Dizziness     (Consider location/radiation/quality/duration/timing/severity/associated sxs/prior Treatment) Patient is a 80 y.o. male presenting with dizziness. The history is provided by the patient and a relative.  Dizziness Associated symptoms: no blood in stool, no chest pain, no diarrhea, no headaches, no shortness of breath and no vomiting   Pt w hx htn, c/o intermittent episodes dizziness in the past couple months. States occurs only with standing/upright position, never at rest or w lying. States usually occurs after he has been resting or sitting for relatively long periods and then tries to get up.  Dizziness is describe as a lightheaded/faint feeling. No room spinning/vertigo. No associated chest pain or discomfort. No unusual doe or fatigue. No diaphoresis. No headaches. No palpitations or sense of fast or irregular heartbeat. Pt denies recent blood loss, no melena. Family states main change in medication over past month has been marked increased in effexor dose, currently 300 mg a day. Family states several tests have been done at [facility] for same, including CT/MRI, carotid dopplers (50% blockage per pt/fam report), labs including cbc, chemistries and thryoid tests.  Family questions need for holter and echo, and also inquires as whether due to medication/effexor.  Pt has been dx w 'orthostatic hypotension' for same in past.  No acute or abrupt worsening in symptoms today. Has appt to follow up with new pcp for same 12/17.  Normal appetite. No recent wt loss or gain. Says mood is positive/good. Denies fever or chills. Denies fall, pain or injury. No loc.     Past Medical History  Diagnosis Date  . Chronic headaches   . Diabetes mellitus without complication   . Hypertension   . Cancer   . Colon cancer   . Diverticulitis    . Syncope   . Hypercholesteremia   . GI bleed     seen by rehman 1998  . Peptic ulcer disease Early 2000 late 1990s    "Bleeding ulcer".  . Anemia, iron deficiency 08/16/2014   Past Surgical History  Procedure Laterality Date  . Uvulectomy  2004   History reviewed. No pertinent family history. History  Substance Use Topics  . Smoking status: Former Smoker    Quit date: 12/01/1956  . Smokeless tobacco: Not on file  . Alcohol Use: No    Review of Systems  Constitutional: Negative for fever and chills.  HENT: Negative for sore throat.   Eyes: Negative for pain and visual disturbance.  Respiratory: Negative for shortness of breath.   Cardiovascular: Negative for chest pain.  Gastrointestinal: Negative for vomiting, abdominal pain, diarrhea and blood in stool.  Endocrine: Negative for polyuria.  Genitourinary: Negative for dysuria and flank pain.  Musculoskeletal: Negative for back pain, gait problem and neck pain.  Skin: Negative for rash.  Neurological: Positive for dizziness and light-headedness. Negative for speech difficulty, weakness, numbness and headaches.  Hematological: Does not bruise/bleed easily.  Psychiatric/Behavioral: Negative for dysphoric mood.      Allergies  Cymbalta; Iodine; Lisinopril; Neurontin; and Zoloft  Home Medications   Prior to Admission medications   Medication Sig Start Date End Date Taking? Authorizing Provider  acetaminophen (TYLENOL) 500 MG tablet Take 1,000 mg by mouth every 6 (six) hours as needed for moderate pain.   Yes Historical Provider, MD  diazepam (VALIUM) 5 MG  tablet Take 2.5 mg by mouth at bedtime as needed (SLEEP).    Yes Historical Provider, MD  diclofenac sodium (VOLTAREN) 1 % GEL Apply 2 g topically daily.   Yes Historical Provider, MD  eszopiclone (LUNESTA) 2 MG TABS tablet Take 1 tablet by mouth at bedtime. 09/17/14  Yes Historical Provider, MD  feeding supplement, GLUCERNA SHAKE, (GLUCERNA SHAKE) LIQD Take 237 mLs by  mouth 2 (two) times daily between meals. 08/18/14  Yes Radene Gunning, NP  ferrous sulfate 325 (65 FE) MG tablet Take 1 tablet (325 mg total) by mouth 2 (two) times daily with a meal. Patient taking differently: Take 325 mg by mouth daily with breakfast.  08/18/14  Yes Rexene Alberts, MD  glimepiride (AMARYL) 1 MG tablet Take 1 tablet (1 mg total) by mouth daily as needed. Take if CBG >150 in the morning. 08/19/14  Yes Radene Gunning, NP  HYDROcodone-acetaminophen (NORCO/VICODIN) 5-325 MG per tablet Take 1 tablet by mouth every 8 (eight) hours as needed for moderate pain. Patient taking differently: Take 0.5-1 tablets by mouth every 6 (six) hours as needed (MIGRAINE).  08/18/14  Yes Lezlie Octave Black, NP  Ibuprofen (ADVIL) 200 MG CAPS Take 400 mg by mouth as needed (pain).   Yes Historical Provider, MD  LYRICA 50 MG capsule Take 1 capsule by mouth daily.  10/03/14  Yes Historical Provider, MD  pantoprazole (PROTONIX) 40 MG tablet Take 1 tablet (40 mg total) by mouth daily. 08/18/14  Yes Lezlie Octave Black, NP  predniSONE (DELTASONE) 5 MG tablet Take 5 mg by mouth daily.  10/03/14  Yes Historical Provider, MD  tamsulosin (FLOMAX) 0.4 MG CAPS capsule Take 1 capsule by mouth daily. 10/12/14  Yes Historical Provider, MD  traZODone (DESYREL) 50 MG tablet Take 50 mg by mouth at bedtime.   Yes Historical Provider, MD  venlafaxine XR (EFFEXOR-XR) 150 MG 24 hr capsule Take 150 mg by mouth 2 (two) times daily.   Yes Historical Provider, MD  azithromycin (ZITHROMAX Z-PAK) 250 MG tablet Take 2 tablets by mouth on day one followed by one tablet daily for 4 days. Patient not taking: Reported on 11/10/2014 10/15/14   Evalee Jefferson, PA-C  cefUROXime (CEFTIN) 250 MG tablet Take 1 tablet (250 mg total) by mouth 2 (two) times daily with a meal. Patient not taking: Reported on 10/15/2014 08/18/14   Radene Gunning, NP  diazepam (VALIUM) 2 MG tablet Take 1 tablet (2 mg total) by mouth every 12 (twelve) hours as needed for anxiety. Patient not  taking: Reported on 10/15/2014 08/18/14   Radene Gunning, NP  venlafaxine XR (EFFEXOR-XR) 37.5 MG 24 hr capsule Take 1 capsule (37.5 mg total) by mouth daily with breakfast. Patient not taking: Reported on 10/15/2014 08/18/14   Radene Gunning, NP   BP 138/71 mmHg  Pulse 77  Temp(Src) 98.9 F (37.2 C) (Oral)  Resp 20  Ht 6\' 2"  (1.88 m)  Wt 204 lb (92.534 kg)  BMI 26.18 kg/m2  SpO2 97% Physical Exam  Constitutional: He is oriented to person, place, and time. He appears well-developed and well-nourished. No distress.  HENT:  Head: Atraumatic.  Mouth/Throat: Oropharynx is clear and moist.  Eyes: Conjunctivae are normal. Pupils are equal, round, and reactive to light. No scleral icterus.  Neck: Neck supple. No tracheal deviation present. No thyromegaly present.  No bruits  Cardiovascular: Normal rate, regular rhythm, normal heart sounds and intact distal pulses.  Exam reveals no gallop and no friction rub.  No murmur heard. Pulmonary/Chest: Effort normal and breath sounds normal. No accessory muscle usage. No respiratory distress.  Abdominal: Soft. Bowel sounds are normal. He exhibits no distension and no mass. There is no tenderness. There is no rebound and no guarding.  Genitourinary:  No cva tenderness  Musculoskeletal: Normal range of motion. He exhibits no edema or tenderness.  Neurological: He is alert and oriented to person, place, and time. No cranial nerve deficit.  Motor intact bil. stre 5/5. sens grossly intact. No pronator drift. Ambulates w steady gait.   Skin: Skin is warm and dry. No rash noted. He is not diaphoretic.  Psychiatric: He has a normal mood and affect.  Nursing note and vitals reviewed.   ED Course  Procedures (including critical care time) Labs Review  Results for orders placed or performed during the hospital encounter of 11/10/14  CBC with Differential  Result Value Ref Range   WBC 13.7 (H) 4.0 - 10.5 K/uL   RBC 4.31 4.22 - 5.81 MIL/uL   Hemoglobin  13.3 13.0 - 17.0 g/dL   HCT 40.9 39.0 - 52.0 %   MCV 94.9 78.0 - 100.0 fL   MCH 30.9 26.0 - 34.0 pg   MCHC 32.5 30.0 - 36.0 g/dL   RDW 15.6 (H) 11.5 - 15.5 %   Platelets 237 150 - 400 K/uL   Neutrophils Relative % 85 (H) 43 - 77 %   Neutro Abs 11.7 (H) 1.7 - 7.7 K/uL   Lymphocytes Relative 7 (L) 12 - 46 %   Lymphs Abs 1.0 0.7 - 4.0 K/uL   Monocytes Relative 7 3 - 12 %   Monocytes Absolute 0.9 0.1 - 1.0 K/uL   Eosinophils Relative 1 0 - 5 %   Eosinophils Absolute 0.1 0.0 - 0.7 K/uL   Basophils Relative 0 0 - 1 %   Basophils Absolute 0.0 0.0 - 0.1 K/uL  Basic metabolic panel  Result Value Ref Range   Sodium 138 137 - 147 mEq/L   Potassium 4.4 3.7 - 5.3 mEq/L   Chloride 98 96 - 112 mEq/L   CO2 27 19 - 32 mEq/L   Glucose, Bld 156 (H) 70 - 99 mg/dL   BUN 30 (H) 6 - 23 mg/dL   Creatinine, Ser 1.06 0.50 - 1.35 mg/dL   Calcium 9.3 8.4 - 10.5 mg/dL   GFR calc non Af Amer 63 (L) >90 mL/min   GFR calc Af Amer 73 (L) >90 mL/min   Anion gap 13 5 - 15  Troponin I  Result Value Ref Range   Troponin I <0.30 <0.30 ng/mL  Urinalysis, Routine w reflex microscopic  Result Value Ref Range   Color, Urine YELLOW YELLOW   APPearance CLEAR CLEAR   Specific Gravity, Urine 1.015 1.005 - 1.030   pH 6.0 5.0 - 8.0   Glucose, UA NEGATIVE NEGATIVE mg/dL   Hgb urine dipstick TRACE (A) NEGATIVE   Bilirubin Urine NEGATIVE NEGATIVE   Ketones, ur NEGATIVE NEGATIVE mg/dL   Protein, ur NEGATIVE NEGATIVE mg/dL   Urobilinogen, UA 0.2 0.0 - 1.0 mg/dL   Nitrite NEGATIVE NEGATIVE   Leukocytes, UA NEGATIVE NEGATIVE  Urine microscopic-add on  Result Value Ref Range   Squamous Epithelial / LPF RARE RARE   WBC, UA 0-2 <3 WBC/hpf   RBC / HPF 0-2 <3 RBC/hpf   Bacteria, UA RARE RARE   Dg Chest 2 View  10/15/2014   CLINICAL DATA:  Acute cough, hypertension, recent pneumonia  EXAM: CHEST - 2  VIEW  COMPARISON:  08/17/2014  FINDINGS: Mild hyperinflation compatible with background COPD/ emphysema. Normal heart size  vascularity. No superimposed pneumonia, collapse or consolidation. Negative for edema, more effusion or pneumothorax. Trachea midline. Atherosclerosis of the aorta. Degenerative changes of the spine.  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   Electronically Signed   By: Daryll Brod M.D.   On: 10/15/2014 10:56       EKG Interpretation   Date/Time:  Friday November 10 2014 14:34:21 EST Ventricular Rate:  77 PR Interval:  205 QRS Duration: 83 QT Interval:  384 QTC Calculation: 435 R Axis:   -38 Text Interpretation:  Sinus rhythm Left axis deviation No significant  change since last tracing Confirmed by Ashok Cordia  MD, Lennette Bihari ([id number]) on  11/10/2014 3:46:33 PM      MDM  Iv ns.   Labs.   Reviewed nursing notes and prior charts for additional history.   Family had inquired about echo for workup - echo lab called - closed until MOnday am.  Discussed w pt/fam. Per their request, and ?hx valvular dis, will refer for echo.  As symptoms not acutely worsen today or this week, no indication for emergent echo today.  Pt ambulates w steady gait. Vitals normal.   Discussed w pt/family limited meds w orthostatic side effects, and close pcp follow up which is already arranged. fam also inquires re cardiology referral - will provide.  Pt currently asymptomatic, and appears stable for d/c.       Mirna Mires, MD 11/10/14 1704

## 2014-11-10 NOTE — ED Notes (Signed)
Patient and family given Coke and crackers at this time.

## 2014-11-10 NOTE — Discharge Instructions (Signed)
It was our pleasure to provide your ER care today - we hope that you feel better.  Discuss with primary care doctor a plan for limiting/tapering down on doses of any of your medications that can cause orthostatic hypotension and/or dizziness.  Follow up with cardiologist in coming week - see referral - call office Monday morning to arrange appointment.  They should be able to assist with with getting ECHO scheduled and completed - ECHO lab number is also attached to your discharge paperwork.   When getting up from lying or sitting position, sit upright on edge of bed/chair/couch for a few minutes prior to standing, and then stand slowly.  If you begin to feel faint/dizzy, lie down right away, to avoid risk of fall and/or injury.  Return to ER if worse, new symptoms, fevers, rapid heart beat, fainting episode, chest pain, trouble breathing, other concern.   Orthostatic Hypotension Orthostatic hypotension is a sudden drop in blood pressure. It happens when you quickly stand up from a seated or lying position. You may feel dizzy or light-headed. This can last for just a few seconds or for up to a few minutes. It is usually not a serious problem. However, if this happens frequently or gets worse, it can be a sign of something more serious. CAUSES  Different things can cause orthostatic hypotension, including:   Loss of body fluids (dehydration).  Medicines that lower blood pressure.  Sudden changes in posture, such as standing up quickly after you have been sitting or lying down.  Taking too much of your medicine. SIGNS AND SYMPTOMS   Light-headedness or dizziness.   Fainting or near-fainting.   A fast heart rate.   Weakness.   Feeling tired (fatigue).  DIAGNOSIS  Your health care provider may do several things to help diagnose your condition and identify the cause. These may include:   Taking a medical history and doing a physical exam.  Checking your blood pressure. Your  health care provider will check your blood pressure when you are:  Lying down.  Sitting.  Standing.  Using tilt table testing. In this test, you lie down on a table that moves from a lying position to a standing position. You will be strapped onto the table. This test monitors your blood pressure and heart rate when you are in different positions. TREATMENT  Treatment will vary depending on the cause. Possible treatments include:   Changing the dosage of your medicines.  Wearing compression stockings on your lower legs.  Standing up slowly after sitting or lying down.  Eating more salt.  Eating frequent, small meals.  In some cases, getting IV fluids.  Taking medicine to enhance fluid retention. HOME CARE INSTRUCTIONS  Only take over-the-counter or prescription medicines as directed by your health care provider.  Follow your health care provider's instructions for changing the dosage of your current medicines.  Do not stop or adjust your medicine on your own.  Stand up slowly after sitting or lying down. This allows your body to adjust to the different position.  Wear compression stockings as directed.  Eat extra salt as directed.  Do not add extra salt to your diet unless directed to by your health care provider.  Eat frequent, small meals.  Avoid standing suddenly after eating.  Avoid hot showers or excessive heat as directed by your health care provider.  Keep all follow-up appointments. SEEK MEDICAL CARE IF:  You continue to feel dizzy or light-headed after standing.  You feel groggy  or confused.  You feel cold, clammy, or sick to your stomach (nauseous).  You have blurred vision.  You feel short of breath. SEEK IMMEDIATE MEDICAL CARE IF:   You faint after standing.  You have chest pain.  You have difficulty breathing.   You lose feeling or movement in your arms or legs.   You have slurred speech or difficulty talking, or you are unable to  talk.  MAKE SURE YOU:   Understand these instructions.  Will watch your condition.  Will get help right away if you are not doing well or get worse. Document Released: 11/07/2002 Document Revised: 11/22/2013 Document Reviewed: 09/09/2013 [facility] Patient Information 2015 [place], [place]. This information is not intended to replace advice given to you by your health care provider. Make sure you discuss any questions you have with your health care provider.     Postural Orthostatic Tachycardia Syndrome Postural orthostatic tachycardia syndrome (POTS) is an increased heart rate when going from a lying (supine) position to a standing position. The heart rate may increase more than 30 beats per minute (BPM) above its resting rate when going from a lying to a standing position. POTS occurs more frequently in women than in men.  SYMPTOMS  POTS symptoms may be increased in the morning. Symptoms of POTS include:  Fainting or near fainting.  Inability to think clearly.  Extreme or chronic fatigue.  Exercise intolerance.  Chest pain.  Having the lower legs develop a reddish-blue color due to decreased blood flow (acrocyanosis). CAUSES POTS can be caused by different conditions. Sometimes, it has no known cause (idiopathic). Some causes of POTS include:  Viral illness.  Pregnancy.  Autoimmune diseases.  Medications.  Major surgery.  Trauma such as a car accident or major injury.  Medical conditions such as anemia, dehydration, and hyperthyroidism. DIAGNOSIS  POTS is diagnosed by:  Taking a complete history and physical exam.  Measuring the heart rate while lying and then upon standing.  Measuring blood pressure when going from a lying to a standing position. POTS is usually not associated with low blood pressure (orthostatic hypotension) when going from a lying to standing position. While standing, blood pressure should be taken 2, 5, and 10 minutes after getting  up. TREATMENT  Treatment of POTS depends upon the severity of the symptoms. Treatment includes:  Drinking plenty of fluids to avoid getting dehydrated.  Avoiding very hot environments to not get overheated.  Increasing your dietary salt intake as instructed by your caregiver.  Taking different types of medications as prescribed for POTS.  Avoiding some classes of medications such as vasodilators and diuretics. SEEK IMMEDIATE MEDICAL CARE IF  You have severe chest pain that does not go away. Call your local emergency service immediately.  You feel your heart racing or beating rapidly.  You feel like passing out.  You have very confused thinking. MAKE SURE YOU  Understand these instructions.  Will watch your condition.  Will get help right away if you are not doing well or get worse. Document Released: 11/07/2002 Document Revised: 04/03/2014 Document Reviewed: 01/15/2011 [facility] Patient Information 2015 [place], [place]. This information is not intended to replace advice given to you by your health care provider. Make sure you discuss any questions you have with your health care provider.

## 2014-11-10 NOTE — ED Notes (Signed)
Pt daughter reports pt has had intermittent dizziness since thanksgiving. Pt has an appointment with Dr. Anastasio Champion on November 17th. Pt daughter reports pt has had increased falls, dizziness recently. Pt alert. Speech clear. nad noted.

## 2014-11-10 NOTE — ED Notes (Signed)
Patient is resting comfortably. 

## 2014-11-10 NOTE — ED Notes (Signed)
Patient ambulated to the nurses station back to room. Patient states he does not feel dizzy walking or sitting on the side of the bed. MD made aware.

## 2014-11-15 ENCOUNTER — Encounter: Payer: Medicare Other | Admitting: Cardiology

## 2014-11-16 ENCOUNTER — Encounter: Payer: Self-pay | Admitting: Cardiology

## 2014-11-16 ENCOUNTER — Ambulatory Visit (INDEPENDENT_AMBULATORY_CARE_PROVIDER_SITE_OTHER): Payer: Medicare Other | Admitting: Cardiology

## 2014-11-16 VITALS — BP 162/78 | HR 82 | Ht 74.0 in | Wt 210.0 lb

## 2014-11-16 DIAGNOSIS — E1143 Type 2 diabetes mellitus with diabetic autonomic (poly)neuropathy: Secondary | ICD-10-CM

## 2014-11-16 DIAGNOSIS — R55 Syncope and collapse: Secondary | ICD-10-CM

## 2014-11-16 DIAGNOSIS — I10 Essential (primary) hypertension: Secondary | ICD-10-CM

## 2014-11-16 DIAGNOSIS — R42 Dizziness and giddiness: Secondary | ICD-10-CM

## 2014-11-16 NOTE — Assessment & Plan Note (Signed)
Patient establishing with Dr. Anastasio Champion primary care later today. Reports reasonable blood sugar control over time with hemoglobin A1c of 7.

## 2014-11-16 NOTE — Patient Instructions (Addendum)
Your physician recommends that you schedule a follow-up appointment in: 15month with Dr. Domenic Polite  Your physician has recommended that you wear a holter monitor for 48 hours. Holter monitors are medical devices that record the heart's electrical activity. Doctors most often use these monitors to diagnose arrhythmias. Arrhythmias are problems with the speed or rhythm of the heartbeat. The monitor is a small, portable device. You can wear one while you do your normal daily activities. This is usually used to diagnose what is causing palpitations/syncope (passing out).  Behavior Health to control Effexor  Stop taking Flomax  Thank you for choosing [facility]!    [name] Kitchen

## 2014-11-16 NOTE — Assessment & Plan Note (Signed)
Would follow blood pressure trend for now.

## 2014-11-16 NOTE — Progress Notes (Signed)
Reason for visit: Dizziness  Clinical Summary Mr. Weckwerth is an 80 y.o.male referred for cardiology consultation by Dr. Ashok Cordia after recent ER visit. Primary care physician is Dr. Anastasio Champion (meeting him today). Records reviewed, he was seen recently with symptoms of orthostatic dizziness. He was not hypotensive during ER evaluation, but did have orthostasis documented (chart reviewed). He is here with his daughter today. Over the last one to 2 months he has had worsening symptoms of orthostatic dizziness resulting in some falls and near syncopal events. In review of his medications, Flomax is relatively new within the last few months, and his Effexor dose has also been increased. He has a history of depression over the years, and has been on different antidepressants, his daughter seems to recall him having difficulty with Effexor in the past. He was seen at [facility] while there visiting and also found to be orthostatic.  Recent lab work showed hemoglobin 13.3, platelets 237, potassium 4.4, BUN 30, creatinine 1.0, troponin I negative. Chest x-ray from November 15 reported stable hyperinflation with no superimposed acute process.  He has undergone evaluation at [facility] including head CT/MRI, carotid Dopplers demonstrating nonobstructive atherosclerosis, and additional laboratory studies. He still follows with a behavioral health specialist at [facility] for his depression.  Orthostatics obtained today were 136/84 with heart rate 75's supine, 158/88 with heart rate 78 seated, and 140/76 with heart rate 88 standing. He was not symptomatic. At this point I suspect that he has an element of autonomic dysfunction associated with long-standing diabetes mellitus, and perhaps his increased orthostatic symptoms in the last few months are medication related. I have asked that he stop Flomax for the time being, and also stop Effexor under the direction of his behavioral health specialists, perhaps considering a different  antidepressant that may be less likely to exacerbate symptoms. It was recommended during one of his prior evaluations that he also have a heart monitor and echocardiogram. We discussed these options as well, although it does not sound like he has a paroxysmal arrhythmia. He does have a heart murmur on examination.  Patient lives in [place], previously worked for the [employer]. Former Pharmacist, hospital at [employer].   Allergies  Allergen Reactions  . Cymbalta [Duloxetine Hcl] Other (See Comments)    Makes patient have jerking motions.  . Iodine Other (See Comments)    Makes patient have jerking motions. And very allergic(per daughter)  . Lisinopril Other (See Comments)    Makes patient have jerking motions.  . Neurontin [Gabapentin] Other (See Comments)    Makes patient have jerking motions.  [name] Kitchen Zoloft [Sertraline Hcl] Other (See Comments)    Makes patient have jerking motions.    Current Outpatient Prescriptions  Medication Sig Dispense Refill  . acetaminophen (TYLENOL) 500 MG tablet Take 1,000 mg by mouth every 6 (six) hours as needed for moderate pain.    . diazepam (VALIUM) 5 MG tablet Take 2.5 mg by mouth at bedtime as needed (SLEEP).     . diclofenac sodium (VOLTAREN) 1 % GEL Apply 2 g topically daily.    . eszopiclone (LUNESTA) 2 MG TABS tablet Take 1 tablet by mouth at bedtime.  0  . feeding supplement, GLUCERNA SHAKE, (GLUCERNA SHAKE) LIQD Take 237 mLs by mouth 2 (two) times daily between meals.  0  . glimepiride (AMARYL) 1 MG tablet Take 1 tablet (1 mg total) by mouth daily as needed. Take if CBG >150 in the morning. 30 tablet 0  . HYDROcodone-acetaminophen (NORCO/VICODIN) 5-325 MG per tablet  Take 1 tablet by mouth every 8 (eight) hours as needed for moderate pain. (Patient taking differently: Take 0.5-1 tablets by mouth every 6 (six) hours as needed (MIGRAINE). ) 30 tablet 0  . Ibuprofen (ADVIL) 200 MG CAPS Take 400 mg by mouth as needed (pain).    [name] Kitchen LYRICA 50 MG capsule Take 1 capsule  by mouth daily.   1  . pantoprazole (PROTONIX) 40 MG tablet Take 1 tablet (40 mg total) by mouth daily. 30 tablet 1  . predniSONE (DELTASONE) 5 MG tablet Take 5 mg by mouth daily.   1  . venlafaxine XR (EFFEXOR-XR) 150 MG 24 hr capsule Take 150 mg by mouth as directed. 150 mg am,75 pm     No current facility-administered medications for this visit.    Past Medical History  Diagnosis Date  . Chronic headaches   . Type 2 diabetes mellitus   . Essential hypertension   . Colon cancer   . Diverticulitis   . Syncope   . Hypercholesteremia   . GI bleed     Dr. Laural Golden - 1998  . Peptic ulcer disease   . Anemia, iron deficiency     Past Surgical History  Procedure Laterality Date  . Uvulectomy  2004    Family History  Problem Relation Age of Onset  . Hypertension      Social History Mr. Antigua reports that he quit smoking about 57 years ago. His smoking use included Cigarettes. He smoked 0.00 packs per day. He does not have any smokeless tobacco history on file. Mr. Dieudonne reports that he does not drink alcohol.  Review of Systems Complete review of systems negative except as otherwise outlined in the clinical summary and also the following. Reports chronic arthritic pains, pain in his hands with prior multiple fractures. Also has PMR. No fevers or chills. On chronic steroids. No dysuria recently. Stable appetite. Reports reasonable blood glucose control with recent hemoglobin A1c 7.  Physical Examination Filed Vitals:   11/16/14 0930  BP: 162/78  Pulse: 82   Filed Weights   11/16/14 0930  Weight: 210 lb (95.255 kg)   Patient appears comfortable at rest, not dizzy with orthostatic measurements. HEENT: Conjunctiva and lids normal, oropharynx clear. Neck: Supple, no elevated JVP, no carotid bruits, no thyromegaly. Lungs: Clear to auscultation, nonlabored breathing at rest. Cardiac: Regular rate and rhythm, no S3, 2/6 basal systolic murmur, no pericardial rub. Abdomen: Soft,  nontender, bowel sounds present, no guarding or rebound. Extremities: Trace ankle edema, distal pulses 2+. Skin: Warm and dry. Musculoskeletal: No kyphosis. Mild arthritic joint deformities. Neuropsychiatric: Alert and oriented x3, affect grossly appropriate.   Problem List and Plan   Dizziness Orthostatic in nature, with intermittent documentation of orthostatic hypotension. At this time I suspect autonomic dysfunction in the setting of long-standing diabetes mellitus, also complicated by medication use. Would stop Flomax, also transition off of Effexor to a different antidepressant that may be less likely to exacerbate dizziness. This can be directed by his behavioral health specialist at [facility]. We will obtain a 48-hour Holter monitor to be complete, although his symptoms do not sound arrhythmogenic. He has a heart murmur on examination, and an echocardiogram will also be obtained. We did discuss other options if his symptoms do not improve such as use of sequential compression hose, and even volume expanders. Follow-up arranged month.  Type 2 diabetes mellitus Patient establishing with Dr. Anastasio Champion primary care later today. Reports reasonable blood sugar control over time with hemoglobin A1c  of 7.  Essential hypertension Would follow blood pressure trend for now.    [name], M.D., F.A.C.C.

## 2014-11-16 NOTE — Assessment & Plan Note (Signed)
Orthostatic in nature, with intermittent documentation of orthostatic hypotension. At this time I suspect autonomic dysfunction in the setting of long-standing diabetes mellitus, also complicated by medication use. Would stop Flomax, also transition off of Effexor to a different antidepressant that may be less likely to exacerbate dizziness. This can be directed by his behavioral health specialist at [facility]. We will obtain a 48-hour Holter monitor to be complete, although his symptoms do not sound arrhythmogenic. He has a heart murmur on examination, and an echocardiogram will also be obtained. We did discuss other options if his symptoms do not improve such as use of sequential compression hose, and even volume expanders. Follow-up arranged month.

## 2014-11-21 ENCOUNTER — Inpatient Hospital Stay (HOSPITAL_COMMUNITY)
Admission: AD | Admit: 2014-11-21 | Discharge: 2014-11-22 | DRG: 379 | Disposition: A | Discharge status: Home / Self Care | Payer: Medicare Other | Source: Ambulatory Visit | Attending: Internal Medicine | Admitting: Internal Medicine

## 2014-11-21 ENCOUNTER — Encounter (HOSPITAL_COMMUNITY): Payer: Self-pay

## 2014-11-21 DIAGNOSIS — E780 Pure hypercholesterolemia: Secondary | ICD-10-CM | POA: Diagnosis present

## 2014-11-21 DIAGNOSIS — F329 Major depressive disorder, single episode, unspecified: Secondary | ICD-10-CM | POA: Diagnosis present

## 2014-11-21 DIAGNOSIS — I10 Essential (primary) hypertension: Secondary | ICD-10-CM | POA: Diagnosis present

## 2014-11-21 DIAGNOSIS — D509 Iron deficiency anemia, unspecified: Secondary | ICD-10-CM | POA: Diagnosis present

## 2014-11-21 DIAGNOSIS — D500 Iron deficiency anemia secondary to blood loss (chronic): Secondary | ICD-10-CM | POA: Diagnosis present

## 2014-11-21 DIAGNOSIS — R42 Dizziness and giddiness: Secondary | ICD-10-CM

## 2014-11-21 DIAGNOSIS — Z8249 Family history of ischemic heart disease and other diseases of the circulatory system: Secondary | ICD-10-CM | POA: Diagnosis not present

## 2014-11-21 DIAGNOSIS — I951 Orthostatic hypotension: Secondary | ICD-10-CM | POA: Diagnosis present

## 2014-11-21 DIAGNOSIS — K5900 Constipation, unspecified: Secondary | ICD-10-CM | POA: Diagnosis present

## 2014-11-21 DIAGNOSIS — K625 Hemorrhage of anus and rectum: Principal | ICD-10-CM | POA: Diagnosis present

## 2014-11-21 DIAGNOSIS — Z888 Allergy status to other drugs, medicaments and biological substances status: Secondary | ICD-10-CM | POA: Diagnosis not present

## 2014-11-21 DIAGNOSIS — E119 Type 2 diabetes mellitus without complications: Secondary | ICD-10-CM | POA: Diagnosis present

## 2014-11-21 DIAGNOSIS — Z85038 Personal history of other malignant neoplasm of large intestine: Secondary | ICD-10-CM | POA: Diagnosis not present

## 2014-11-21 HISTORY — DX: Depression, unspecified: F32.A

## 2014-11-21 HISTORY — DX: Major depressive disorder, single episode, unspecified: F32.9

## 2014-11-21 LAB — COMPREHENSIVE METABOLIC PANEL
ALT: 41 U/L (ref 0–53)
ANION GAP: 6 (ref 5–15)
AST: 25 U/L (ref 0–37)
Albumin: 3.6 g/dL (ref 3.5–5.2)
Alkaline Phosphatase: 75 U/L (ref 39–117)
BUN: 26 mg/dL — ABNORMAL HIGH (ref 6–23)
CALCIUM: 9 mg/dL (ref 8.4–10.5)
CO2: 27 mmol/L (ref 19–32)
CREATININE: 0.93 mg/dL (ref 0.50–1.35)
Chloride: 105 mEq/L (ref 96–112)
GFR calc Af Amer: 88 mL/min — ABNORMAL LOW (ref >90)
GFR, EST NON AFRICAN AMERICAN: 76 mL/min — AB (ref >90)
Glucose, Bld: 151 mg/dL — ABNORMAL HIGH (ref 70–99)
Potassium: 4 mmol/L (ref 3.5–5.1)
Sodium: 138 mmol/L (ref 135–145)
Total Bilirubin: 0.4 mg/dL (ref 0.3–1.2)
Total Protein: 6.7 g/dL (ref 6.0–8.3)

## 2014-11-21 LAB — HEMOGLOBIN A1C
HEMOGLOBIN A1C: 7.1 % — AB (ref <5.7)
Mean Plasma Glucose: 157 mg/dL — ABNORMAL HIGH (ref <117)

## 2014-11-21 LAB — CBC
HCT: 41.2 % (ref 39.0–52.0)
HEMATOCRIT: 42.1 % (ref 39.0–52.0)
Hemoglobin: 13.3 g/dL (ref 13.0–17.0)
Hemoglobin: 13.7 g/dL (ref 13.0–17.0)
MCH: 30.9 pg (ref 26.0–34.0)
MCH: 31 pg (ref 26.0–34.0)
MCHC: 32.3 g/dL (ref 30.0–36.0)
MCHC: 32.5 g/dL (ref 30.0–36.0)
MCV: 95.6 fL (ref 78.0–100.0)
MCV: 95.2 fL (ref 78.0–100.0)
Platelets: 260 10*3/uL (ref 150–400)
Platelets: 235 10*3/uL (ref 150–400)
RBC: 4.31 MIL/uL (ref 4.22–5.81)
RBC: 4.42 MIL/uL (ref 4.22–5.81)
RDW: 15.2 % (ref 11.5–15.5)
RDW: 15.1 % (ref 11.5–15.5)
WBC: 15.8 10*3/uL — AB (ref 4.0–10.5)
WBC: 11.5 10*3/uL — AB (ref 4.0–10.5)

## 2014-11-21 LAB — GLUCOSE, CAPILLARY
GLUCOSE-CAPILLARY: 105 mg/dL — AB (ref 70–99)
Glucose-Capillary: 149 mg/dL — ABNORMAL HIGH (ref 70–99)

## 2014-11-21 LAB — URINALYSIS, ROUTINE W REFLEX MICROSCOPIC
BILIRUBIN URINE: NEGATIVE
Glucose, UA: NEGATIVE mg/dL
Hgb urine dipstick: NEGATIVE
Ketones, ur: NEGATIVE mg/dL
Leukocytes, UA: NEGATIVE
Nitrite: NEGATIVE
Protein, ur: NEGATIVE mg/dL
Specific Gravity, Urine: 1.02 (ref 1.005–1.030)
UROBILINOGEN UA: 0.2 mg/dL (ref 0.0–1.0)
pH: 6 (ref 5.0–8.0)

## 2014-11-21 LAB — APTT: APTT: 29 s (ref 24–37)

## 2014-11-21 LAB — PROTIME-INR
INR: 1.02 (ref 0.00–1.49)
PROTHROMBIN TIME: 13.5 s (ref 11.6–15.2)

## 2014-11-21 MED ORDER — ACETAMINOPHEN 325 MG PO TABS
650.0000 mg | ORAL_TABLET | Freq: Four times a day (QID) | ORAL | Status: DC | PRN
  Administered 2014-11-22: 650 mg via ORAL
  Filled 2014-11-21: qty 2

## 2014-11-21 MED ORDER — GLUCERNA SHAKE PO LIQD
237.0000 mL | Freq: Two times a day (BID) | ORAL | Status: DC
  Administered 2014-11-22 (×2): 237 mL via ORAL

## 2014-11-21 MED ORDER — ACETAMINOPHEN 650 MG RE SUPP: 650.0000 mg | Freq: Four times a day (QID) | RECTAL | Status: DC | PRN

## 2014-11-21 MED ORDER — ZOLPIDEM TARTRATE 5 MG PO TABS: 5.0000 mg | ORAL_TABLET | Freq: Every evening | ORAL | Status: DC | PRN

## 2014-11-21 MED ORDER — BISACODYL 10 MG RE SUPP: 10.0000 mg | Freq: Every day | RECTAL | Status: DC | PRN

## 2014-11-21 MED ORDER — ALUM & MAG HYDROXIDE-SIMETH 200-200-20 MG/5ML PO SUSP: 30.0000 mL | Freq: Four times a day (QID) | ORAL | Status: DC | PRN

## 2014-11-21 MED ORDER — INSULIN ASPART 100 UNIT/ML ~~LOC~~ SOLN
0.0000 [IU] | Freq: Three times a day (TID) | SUBCUTANEOUS | Status: DC
Start: 2014-11-21 — End: 2014-11-22
  Administered 2014-11-21: 2 [IU] via SUBCUTANEOUS
  Administered 2014-11-22: 3 [IU] via SUBCUTANEOUS

## 2014-11-21 MED ORDER — HYDROCODONE-ACETAMINOPHEN 5-325 MG PO TABS: 1.0000 | ORAL_TABLET | Freq: Three times a day (TID) | ORAL | Status: DC | PRN

## 2014-11-21 MED ORDER — DOCUSATE SODIUM 100 MG PO CAPS
100.0000 mg | ORAL_CAPSULE | Freq: Two times a day (BID) | ORAL | Status: DC
  Administered 2014-11-21 – 2014-11-22 (×3): 100 mg via ORAL
  Filled 2014-11-21 (×3): qty 1

## 2014-11-21 MED ORDER — VENLAFAXINE HCL ER 75 MG PO CP24
75.0000 mg | ORAL_CAPSULE | Freq: Every day | ORAL | Status: DC
  Administered 2014-11-21: 75 mg via ORAL
  Filled 2014-11-21: qty 1

## 2014-11-21 MED ORDER — ACETAMINOPHEN 500 MG PO TABS: 1000.0000 mg | ORAL_TABLET | Freq: Four times a day (QID) | ORAL | Status: DC | PRN

## 2014-11-21 MED ORDER — ONDANSETRON HCL 4 MG/2ML IJ SOLN: 4.0000 mg | Freq: Four times a day (QID) | INTRAMUSCULAR | Status: DC | PRN

## 2014-11-21 MED ORDER — SODIUM CHLORIDE 0.9 % IV SOLN: INTRAVENOUS | Status: DC

## 2014-11-21 MED ORDER — HYDROCORTISONE ACETATE 25 MG RE SUPP
25.0000 mg | Freq: Every day | RECTAL | Status: DC
  Administered 2014-11-21: 25 mg via RECTAL
  Filled 2014-11-21 (×6): qty 1

## 2014-11-21 MED ORDER — PANTOPRAZOLE SODIUM 40 MG PO TBEC
40.0000 mg | DELAYED_RELEASE_TABLET | Freq: Every day | ORAL | Status: DC
  Administered 2014-11-21 – 2014-11-22 (×2): 40 mg via ORAL
  Filled 2014-11-21 (×2): qty 1

## 2014-11-21 MED ORDER — DIAZEPAM 5 MG PO TABS: 2.5000 mg | ORAL_TABLET | Freq: Every evening | ORAL | Status: DC | PRN

## 2014-11-21 MED ORDER — ONDANSETRON HCL 4 MG PO TABS: 4.0000 mg | ORAL_TABLET | Freq: Four times a day (QID) | ORAL | Status: DC | PRN

## 2014-11-21 MED ORDER — VENLAFAXINE HCL ER 75 MG PO CP24
150.0000 mg | ORAL_CAPSULE | Freq: Every day | ORAL | Status: DC
  Administered 2014-11-22: 150 mg via ORAL
  Filled 2014-11-21: qty 2

## 2014-11-21 MED ORDER — POLYETHYLENE GLYCOL 3350 17 G PO PACK: 17.0000 g | PACK | Freq: Every day | ORAL | Status: DC | PRN

## 2014-11-21 NOTE — Consult Note (Signed)
Referring Provider: Murray Hodgkins, MD Primary Care Physician:  Doree Albee, MD Primary Gastroenterologist:  Dr. Laural Golden  Reason for Consultation:    Rectal bleeding.  HPI:   History is provided by patient and his daughter [name].  Patient is 80 year old Caucasian male who was admitted to Dr. Sarajane Jews following office evaluation by Dr. Anastasio Champion, patient,s primary care physician. Present illness began 3 days ago when he had painful defecation because he was constipated and passed hard stool. He noted fresh blood with his bowel movements. He believes he passed moderate amount of blood. He does have history of hemorrhoids. He did not experience abdominal pain nausea or vomiting. 2 days ago he noted bright red blood with his bowel movement although he was not constipated this time. He hasn't had a bowel movement or rectal bleeding since then. However he has been feeling weak and lightheaded particularly when he stands up. He was seen by Dr. Anastasio Champion and noted to have orthostatic hypotension concerning for significant bleed and he was therefore hospitalized. According to his daughter patient has been struggling with severe depression. He has been under care of Dr. Donalynn Furlong of [facility]. On Thanksgiving day about 1 month ago he had syncope. He was staying at [facility]. He was taken to the emergency room at [facility] and workup was negative. He was felt to have orthostatic hypotension secondary to Effexor. His physicians considered stopping this medication but decided to reduce the dose because of his depression.  Review of the systems is positive for so-so appetite 9 pound weight loss over the last few months. It is negative for nausea vomiting heartburn dysphagia or melena. He also complains of constant pain in both hands felt to be secondary to osteoarthrosis but he also has polymyalgia rheumatica and is on prednisone 5 mg daily. He has history of colonic adenomas and family history of colon carcinoma  and underwent colonoscopy at [facility] by Dr. [name] with removal of 3 small adenomas and random biopsies from sigmoid colon were negative. Patient has been taking Advil no more than twice a week and has not experienced any side effects. He states this dose is not helping. He also has history of upper GI bleed in 1998 secondary to NSAIDs when he was evaluated by me at [facility] in [place] ,[place]. He has been treated for H. pylori in the past.      Past Medical History  Diagnosis Date  . Chronic headaches   . Type 2 diabetes mellitus; he takes Amaryl and FBS is greater than 150    . Essential hypertension   .  history of colonic adenomas; last colonoscopy was in February 2014 as above    . Diverticulitis in 1987.    [name] Kitchen Syncope   . Hypercholesteremia   .  polymyalgia rheumatica       orthostatic hypotension .  [name] Kitchen UGI secondary to peptic ulcer disease 1998    .  recent hospitalization in September 2015 for UTI  With SIRS   . Depression        Peripheral neuropathy.      Osteoarthrosis of both knees.      Chronic pain in both hands felt to be secondary to OA.  Past Surgical History  Procedure Laterality Date  . Uvulectomy  2004    tonsillectomy and adenoidectomy.   Appendectomy.   Surgery on right shoulder for rotator cuff tear and arthritis.   TURP in 2005    Lumbar spine fusion at L4 and L5 in  2008 at [facility]  Prior to Admission medications   Medication Sig Start Date End Date Taking? Authorizing Provider  acetaminophen (TYLENOL) 500 MG tablet Take 1,000 mg by mouth every 6 (six) hours as needed for moderate pain.   Yes Historical Provider, MD  diazepam (VALIUM) 5 MG tablet Take 5 mg by mouth at bedtime as needed for anxiety (Sleep).    Yes Historical Provider, MD  diclofenac sodium (VOLTAREN) 1 % GEL Apply 2 g topically daily.   Yes Historical Provider, MD  eszopiclone (LUNESTA) 2 MG TABS tablet Take 1 tablet by mouth at bedtime as needed for sleep.  09/17/14  Yes Historical Provider,  MD  feeding supplement, GLUCERNA SHAKE, (GLUCERNA SHAKE) LIQD Take 237 mLs by mouth 2 (two) times daily between meals. 08/18/14  Yes Lezlie Octave Black, NP  glimepiride (AMARYL) 1 MG tablet Take 1 tablet (1 mg total) by mouth daily as needed. Take if CBG >150 in the morning. Patient taking differently: Take 1 mg by mouth daily as needed (high blood sugar). Take if CBG >150 in the morning. 08/19/14  Yes Radene Gunning, NP  HYDROcodone-acetaminophen (NORCO/VICODIN) 5-325 MG per tablet Take 1 tablet by mouth every 8 (eight) hours as needed for moderate pain. Patient taking differently: Take 0.5-1 tablets by mouth every 6 (six) hours as needed (MIGRAINE).  08/18/14  Yes Lezlie Octave Black, NP  Ibuprofen (ADVIL) 200 MG CAPS Take 400 mg by mouth as needed (pain).   Yes Historical Provider, MD  IRON PO Take 1 tablet by mouth daily.   Yes Historical Provider, MD  LYRICA 50 MG capsule Take 1 capsule by mouth at bedtime.  10/03/14  Yes Historical Provider, MD  pantoprazole (PROTONIX) 40 MG tablet Take 1 tablet (40 mg total) by mouth daily. 08/18/14  Yes Lezlie Octave Black, NP  predniSONE (DELTASONE) 5 MG tablet Take 5 mg by mouth daily.  10/03/14  Yes Historical Provider, MD  venlafaxine (EFFEXOR) 37.5 MG tablet Take 75 mg by mouth at bedtime.   Yes Historical Provider, MD  venlafaxine XR (EFFEXOR-XR) 150 MG 24 hr capsule Take 150 mg by mouth every morning.    Yes Historical Provider, MD    Current Facility-Administered Medications  Medication Dose Route Frequency Provider Last Rate Last Dose  . 0.9 %  sodium chloride infusion   Intravenous Continuous Radene Gunning, NP      . acetaminophen (TYLENOL) tablet 650 mg  650 mg Oral Q6H PRN Radene Gunning, NP       Or  . acetaminophen (TYLENOL) suppository 650 mg  650 mg Rectal Q6H PRN Radene Gunning, NP      . acetaminophen (TYLENOL) tablet 1,000 mg  1,000 mg Oral Q6H PRN Radene Gunning, NP      . alum & mag hydroxide-simeth (MAALOX/MYLANTA) 200-200-20 MG/5ML suspension 30 mL  30 mL  Oral Q6H PRN Radene Gunning, NP      . bisacodyl (DULCOLAX) suppository 10 mg  10 mg Rectal Daily PRN Radene Gunning, NP      . diazepam (VALIUM) tablet 2.5 mg  2.5 mg Oral QHS PRN Radene Gunning, NP      . docusate sodium (COLACE) capsule 100 mg  100 mg Oral BID Radene Gunning, NP   100 mg at 11/21/14 1336  . feeding supplement (GLUCERNA SHAKE) (GLUCERNA SHAKE) liquid 237 mL  237 mL Oral BID BM Lezlie Octave Black, NP   237 mL at 11/21/14 1400  . HYDROcodone-acetaminophen (  NORCO/VICODIN) 5-325 MG per tablet 1 tablet  1 tablet Oral Q8H PRN Radene Gunning, NP      . insulin aspart (novoLOG) injection 0-15 Units  0-15 Units Subcutaneous TID WC Radene Gunning, NP   2 Units at 11/21/14 1645  . ondansetron (ZOFRAN) tablet 4 mg  4 mg Oral Q6H PRN Radene Gunning, NP       Or  . ondansetron [facility]) injection 4 mg  4 mg Intravenous Q6H PRN Radene Gunning, NP      . pantoprazole (PROTONIX) EC tablet 40 mg  40 mg Oral Daily Radene Gunning, NP   40 mg at 11/21/14 1336  . polyethylene glycol (MIRALAX / GLYCOLAX) packet 17 g  17 g Oral Daily PRN Radene Gunning, NP      . Derrill Memo ON 11/22/2014] venlafaxine XR (EFFEXOR-XR) 24 hr capsule 150 mg  150 mg Oral Daily Lezlie Octave Black, NP      . venlafaxine XR (EFFEXOR-XR) 24 hr capsule 75 mg  75 mg Oral QHS Samuella Cota, MD      . zolpidem [facility]) tablet 5 mg  5 mg Oral QHS PRN Radene Gunning, NP        Allergies as of 11/21/2014 - Review Complete 11/21/2014  Allergen Reaction Noted  . Cymbalta [duloxetine hcl] Other (See Comments) 08/16/2014  . Iodine Hives 08/16/2014  . Lisinopril Other (See Comments) 08/16/2014  . Neurontin [gabapentin] Other (See Comments) 08/16/2014  . Zoloft [sertraline hcl] Other (See Comments) 08/16/2014    Family History  Problem Relation Age of Onset  . Hypertension      History   Social History  . Marital Status: Single    Spouse Name: N/A    Number of Children: N/A  . Years of Education: N/A   Occupational History  . Not on file.    Social History Main Topics  . Smoking status: Former Smoker    Types: Cigarettes    Quit date: 12/01/1956  . Smokeless tobacco: Not on file  . Alcohol Use: No  . Drug Use: No  . Sexual Activity: No   Other Topics Concern  . Not on file   Social History Narrative    Review of Systems: See HPI, otherwise normal ROS  Physical Exam: Temp:  [98.3 F (36.8 C)] 98.3 F (36.8 C) (12/22 1618) Pulse Rate:  [71-81] 71 (12/22 1618) Resp:  [18] 18 (12/22 1618) BP: (121-139)/(52-65) 139/65 mmHg (12/22 1618) SpO2:  [96 %-97 %] 97 % (12/22 1618) Weight:  [196 lb 4.8 oz (89.041 kg)] 196 lb 4.8 oz (89.041 kg) (12/22 1222)   patient is alert and in no acute distress. Conjunctiva was pink. Sclerae nonicteric. Oropharyngeal mucosa is normal. No neck masses or thyromegaly noted. Cardiac exam with regular rhythm normal S1 and S2. Faint systolic ejection murmur best heard at aortic area. Auscultation of lungs revealed basilar breath sounds bilaterally. Abdomen is symmetrical. Bowel sounds are normal. On palpation is soft and nontender without organomegaly or masses. Rectal examination deferred. No peripheral edema or clubbing noted. He has weak grip in both hands.    Lab Results:  Recent Labs  11/21/14 1310  WBC 15.8*  HGB 13.3  HCT 41.2  PLT 260   BMET  Recent Labs  11/21/14 1310  NA 138  K 4.0  CL 105  CO2 27  GLUCOSE 151*  BUN 26*  CREATININE 0.93  CALCIUM 9.0   LFT  Recent Labs  11/21/14 1310  PROT 6.7  ALBUMIN 3.6  AST 25  ALT 41  ALKPHOS 75  BILITOT 0.4   PT/INR  Recent Labs  11/21/14 1310  LABPROT 13.5  INR 1.02     Assessment;  #1. Rectal bleeding. Patient experienced rectal bleeding 3 and 2 days ago but none since then. His H&H is normal. Suspect he bled from hemorrhoids given that he was constipated and had to strain when rectal bleeding started. He has history of colonic adenomas and family history of colon carcinoma. Last colonoscopy  was in February 2014 at [facility] with removal of 3 small tubular adenomas and random biopsy from sigmoid colon was negative. Unless there is evidence of recurrent bleed there is no indication for further evaluation. Patient has been experiencing orthostatic symptoms since Thanksgiving which appears to be separate issue and not because of bleeding. #2. More history of upper GI bleed secondary to peptic ulcer [phone]). Even though he has been using Advil sparingly he does not have any symptoms. H. pylori status needs to be checked. #3. Orthostatic hypotension. He appears to be quite symptomatic. This illness is most likely related to his antidepressant or due to autonomic neuropathy. #5. Leukocytosis. Urinalysis is negative. This may or may not be related to low-dose prednisone. #6. Abnormal iron studies 3 months ago. Serum iron was less than 10 but serum ferritin is over 900. These studies need to be repeated.  Recommendations;  Full liquid diet. Stool tests for H. pylori antigen. Serum iron TIBC and ferritin with a.m Lab. Anusol HC suppository 1 per rectum daily at bedtime for 2 weeks. Continue pantoprazole chronically for GI prophylaxis. Will begin stool softener and fiber supplement at the time of discharge.   LOS: 0 days   Breylon Sherrow U  11/21/2014, 5:19 PM

## 2014-11-21 NOTE — H&P (Signed)
[facility] History and Physical  WEAVER TWEED SHF:[id number] DOB: 01-11-1930 DOA: 11/21/2014  Referring physician:  PCP: Doree Albee, MD   Chief Complaint: BRBPR  HPI: Jared Bridges is a very pleasant 80 y.o. male with a past medical history that includes hypertension, diabetes, diverticulitis, anal fissure, remote PUD and arthritis taking NSAID presents to room 332 directly from PCPs office with the chief complaint of bright red blood per rectum. Patient states that his last 2 bowel movements "were quite difficult" and upon cleaning there was bright red blood on the tissue. He denies any bloody stool any clots. He denies any dark stool or maroon stool. He reports he does take Advil fairly frequently for arthritis. He denies abdominal pain nausea vomiting. He denies any shortness of breath palpitations. He does report some "lightheadedness" this morning as he got up from his chair proceeded to walk up stairs. Indicates he's been managing orthostatic hypotension for the last several weeks his medications have been adjusted around this issue. He denies any chest pain lower extremity edema orthopnea. He reports his last colonoscopy was 2014 done at [facility] and his daughter reports "he's almost 3 year plan". He has seen Dr. Laural Golden in the past. He denies fever chills dysuria hematuria frequency or urgency. Upon admission he is hemodynamically stable afebrile and not hypoxic.   Review of Systems:  10 point review of systems complete and all systems are negative except as indicated in history of present illness  Past Medical History  Diagnosis Date  . Chronic headaches   . Type 2 diabetes mellitus   . Essential hypertension   . Colon cancer   . Diverticulitis   . Syncope   . Hypercholesteremia   . GI bleed     Dr. Laural Golden - 1998  . Peptic ulcer disease   . Anemia, iron deficiency   . Depression    Past Surgical History  Procedure Laterality Date  . Uvulectomy  2004   Social  History:  reports that he quit smoking about 58 years ago. His smoking use included Cigarettes. He smoked 0.00 packs per day. He does not have any smokeless tobacco history on file. He reports that he does not drink alcohol or use illicit drugs. He is married and lives at home with his wife he is independent with ADLs Allergies  Allergen Reactions  . Cymbalta [Duloxetine Hcl] Other (See Comments)    Makes patient have jerking motions.  . Iodine Other (See Comments)    Makes patient have jerking motions. And very allergic(per daughter)  . Lisinopril Other (See Comments)    Makes patient have jerking motions.  . Neurontin [Gabapentin] Other (See Comments)    Makes patient have jerking motions.  [name] Kitchen Zoloft [Sertraline Hcl] Other (See Comments)    Makes patient have jerking motions.    Family History  Problem Relation Age of Onset  . Hypertension       Prior to Admission medications   Medication Sig Start Date End Date Taking? Authorizing Provider  acetaminophen (TYLENOL) 500 MG tablet Take 1,000 mg by mouth every 6 (six) hours as needed for moderate pain.    Historical Provider, MD  diazepam (VALIUM) 5 MG tablet Take 2.5 mg by mouth at bedtime as needed (SLEEP).     Historical Provider, MD  diclofenac sodium (VOLTAREN) 1 % GEL Apply 2 g topically daily.    Historical Provider, MD  eszopiclone (LUNESTA) 2 MG TABS tablet Take 1 tablet by mouth at bedtime. 09/17/14  Historical Provider, MD  feeding supplement, GLUCERNA SHAKE, (GLUCERNA SHAKE) LIQD Take 237 mLs by mouth 2 (two) times daily between meals. 08/18/14   Radene Gunning, NP  glimepiride (AMARYL) 1 MG tablet Take 1 tablet (1 mg total) by mouth daily as needed. Take if CBG >150 in the morning. 08/19/14   Radene Gunning, NP  HYDROcodone-acetaminophen (NORCO/VICODIN) 5-325 MG per tablet Take 1 tablet by mouth every 8 (eight) hours as needed for moderate pain. Patient taking differently: Take 0.5-1 tablets by mouth every 6 (six) hours as  needed (MIGRAINE).  08/18/14   Radene Gunning, NP  Ibuprofen (ADVIL) 200 MG CAPS Take 400 mg by mouth as needed (pain).    Historical Provider, MD  LYRICA 50 MG capsule Take 1 capsule by mouth daily.  10/03/14   Historical Provider, MD  pantoprazole (PROTONIX) 40 MG tablet Take 1 tablet (40 mg total) by mouth daily. 08/18/14   Radene Gunning, NP  predniSONE (DELTASONE) 5 MG tablet Take 5 mg by mouth daily.  10/03/14   Historical Provider, MD  venlafaxine XR (EFFEXOR-XR) 150 MG 24 hr capsule Take 150 mg by mouth as directed. 150 mg am,75 pm    Historical Provider, MD   Physical Exam: Filed Vitals:   11/21/14 1222  BP: 121/52  Pulse: 81  Temp: 98.3 F (36.8 C)  TempSrc: Oral  Resp: 18  Height: 6\' 2"  (1.88 m)  SpO2: 96%    Wt Readings from Last 3 Encounters:  11/16/14 95.255 kg (210 lb)  11/10/14 92.534 kg (204 lb)  10/15/14 87.998 kg (194 lb)    General:  Appears calm and comfortable Eyes: PERRL, normal lids, irises & conjunctiva ENT: grossly normal hearing, lips & tongue Neck: no LAD, masses or thyromegaly Cardiovascular: RRR, no m/r/g. No LE edema. Pedal pulses present and palpable Respiratory: CTA bilaterally, no w/r/r. Normal respiratory effort. Abdomen: soft, ntnd nondistended positive bowel sounds throughout no guarding Skin: no rash or induration seen on limited exam Musculoskeletal: grossly normal tone BUE/BLE Psychiatric: grossly normal mood and affect, speech fluent and appropriate Neurologic: grossly non-focal. Speech clear facial symmetry           Labs on Admission:  Basic Metabolic Panel: No results for input(s): NA, K, CL, CO2, GLUCOSE, BUN, CREATININE, CALCIUM, MG, PHOS in the last 168 hours. Liver Function Tests: No results for input(s): AST, ALT, ALKPHOS, BILITOT, PROT, ALBUMIN in the last 168 hours. No results for input(s): LIPASE, AMYLASE in the last 168 hours. No results for input(s): AMMONIA in the last 168 hours. CBC: No results for input(s): WBC,  NEUTROABS, HGB, HCT, MCV, PLT in the last 168 hours. Cardiac Enzymes: No results for input(s): CKTOTAL, CKMB, CKMBINDEX, TROPONINI in the last 168 hours.  BNP (last 3 results) No results for input(s): PROBNP in the last 8760 hours. CBG: No results for input(s): GLUCAP in the last 168 hours.  Radiological Exams on Admission: No results found.  EKG:   Assessment/Plan Principal Problem:   BRBPR (bright red blood per rectum): In patient with recent history of NSAID use, remote history of peptic ulcer disease, and internal hemorrhoids, diverticulitis status post resection 1989. Will admit. Will get CBC and BMET. Continue PPI. Stool softner. NPO until evaluated by GI. Request GI consult. Hold NSAID and prednisone.  Monitor closely Active Problems:   Essential hypertension: fair control on admission. Not on anti-hypertensive meds. Hx orthostatic hypotension. Recent cardiology visit 12/17.    Type 2 diabetes mellitus: carb modified diet. Obtain  A1C. Oral agents at home. Will hold for now.  SSI     Anemia, iron deficiency: await CBC . Hospitalization 8/15 reveal IDA. Hx PUD (remote). Has been on iron supplements in past    Dizziness: chronic. Intermittently orthostatic with some falls and near syncope.  Recent cardiology note indicates suspicion for autonomic dysfunction and medications. Effexor has been decreased and flomax discontinued and symptoms persist. Cardiology note indicates plan for 48 hour Holter monitor. Echo ordered as well. Appointment 11/27/14    Code Status: full DVT Prophylaxis: Family Communication: wife and daughter at bedside Disposition Plan: home when ready  Time spent: 32 minutes  [facility]

## 2014-11-22 DIAGNOSIS — I359 Nonrheumatic aortic valve disorder, unspecified: Secondary | ICD-10-CM

## 2014-11-22 DIAGNOSIS — R42 Dizziness and giddiness: Secondary | ICD-10-CM

## 2014-11-22 LAB — GLUCOSE, CAPILLARY
Glucose-Capillary: 104 mg/dL — ABNORMAL HIGH (ref 70–99)
Glucose-Capillary: 186 mg/dL — ABNORMAL HIGH (ref 70–99)
Glucose-Capillary: 140 mg/dL — ABNORMAL HIGH (ref 70–99)

## 2014-11-22 LAB — IRON AND TIBC
IRON: 101 ug/dL (ref 42–135)
Saturation Ratios: 37 % (ref 20–55)
TIBC: 276 ug/dL (ref 215–435)
UIBC: 175 ug/dL (ref 125–400)

## 2014-11-22 LAB — CBC
HCT: 43.2 % (ref 39.0–52.0)
HEMOGLOBIN: 14 g/dL (ref 13.0–17.0)
MCH: 31.3 pg (ref 26.0–34.0)
MCHC: 32.4 g/dL (ref 30.0–36.0)
MCV: 96.4 fL (ref 78.0–100.0)
Platelets: 258 10*3/uL (ref 150–400)
RBC: 4.48 MIL/uL (ref 4.22–5.81)
RDW: 15.2 % (ref 11.5–15.5)
WBC: 13.3 10*3/uL — AB (ref 4.0–10.5)

## 2014-11-22 LAB — BASIC METABOLIC PANEL
Anion gap: 8 (ref 5–15)
BUN: 17 mg/dL (ref 6–23)
CHLORIDE: 103 meq/L (ref 96–112)
CO2: 29 mmol/L (ref 19–32)
Calcium: 9.4 mg/dL (ref 8.4–10.5)
Creatinine, Ser: 0.96 mg/dL (ref 0.50–1.35)
GFR calc Af Amer: 87 mL/min — ABNORMAL LOW (ref >90)
GFR calc non Af Amer: 75 mL/min — ABNORMAL LOW (ref >90)
GLUCOSE: 115 mg/dL — AB (ref 70–99)
Potassium: 4.1 mmol/L (ref 3.5–5.1)
SODIUM: 140 mmol/L (ref 135–145)

## 2014-11-22 LAB — FERRITIN: FERRITIN: 290 ng/mL (ref 22–322)

## 2014-11-22 MED ORDER — DIAZEPAM 5 MG PO TABS: 2.5000 mg | ORAL_TABLET | Freq: Every evening | ORAL | Status: DC | PRN

## 2014-11-22 MED ORDER — POLYETHYLENE GLYCOL 3350 17 G PO PACK: 17.0000 g | PACK | Freq: Every day | ORAL | Status: DC | PRN

## 2014-11-22 MED ORDER — HYDROCORTISONE ACETATE 25 MG RE SUPP: 25.0000 mg | Freq: Every day | RECTAL | Status: DC

## 2014-11-22 MED ORDER — DSS 100 MG PO CAPS: 100.0000 mg | ORAL_CAPSULE | Freq: Two times a day (BID) | ORAL | Status: DC

## 2014-11-22 NOTE — Progress Notes (Signed)
  Subjective:  Patient states he had formed stool this morning. It was soft. It was dark but not black. He did not pass any blood with his bowel movement. He denies abdominal pain nausea or vomiting.   Objective: Blood pressure 154/64, pulse 74, temperature 97.8 F (36.6 C), temperature source Oral, resp. rate 18, height 6\' 2"  (1.88 m), weight 197 lb 4.8 oz (89.495 kg), SpO2 97 %. Patient is alert and appears to be in no acute distress. Abdomen is soft and nontender without organomegaly or masses. No LE edema or clubbing noted.  Labs/studies Results:   Recent Labs  11/21/14 1310 11/21/14 1956 11/22/14 0637  WBC 15.8* 11.5* 13.3*  HGB 13.3 13.7 14.0  HCT 41.2 42.1 43.2  PLT 260 235 258    BMET   Recent Labs  11/21/14 1310 11/22/14 0637  NA 138 140  K 4.0 4.1  CL 105 103  CO2 27 29  GLUCOSE 151* 115*  BUN 26* 17  CREATININE 0.93 0.96  CALCIUM 9.0 9.4    serum iron 101, TIBC 276 and saturation 37%  Serum ferritin 290.   H. pylori stool antigen is pending.   Assessment:  #1. Rectal bleeding. Rectal bleeding has stopped. He had a normal bowel movement and did not pass any blood. H&H is normal. #2.  iron studies are normal and not indicated of of iron deficiency anemia.  Recommendations;  Continue Anusol HC suppository 1 daily for total of 2 weeks. Patient advised to take Metamucil or Benefiber 4 g by mouth daily at bedtime. He should also take Colace 200 mg by mouth daily at bedtime. Will contact patient or family member with results of H. pylori stool antigen when completed.

## 2014-11-22 NOTE — Progress Notes (Signed)
UR chart review completed.  

## 2014-11-22 NOTE — Discharge Summary (Signed)
Physician Discharge Summary  Jared Bridges GEZ:[id number] DOB: 01-17-1930 DOA: 11/21/2014  PCP: Doree Albee, MD  Admit date: 11/21/2014 Discharge date: 11/22/2014  Time spent: 40 minutes  Recommendations for Outpatient Follow-up:  1. Follow up with PCP in 1-2 weeks for evaluation of medications/depression/follow stool studies and ferritin and iron level. Recommend evaluation of ongoing depression.  2. Has appointment 11/27/14 for Holter monitor. Will not need OP echo as it was done IP.   Discharge Diagnoses:  Principal Problem:   BRBPR (bright red blood per rectum) Active Problems:   Essential hypertension   Type 2 diabetes mellitus   Anemia, iron deficiency   Dizziness   Orthostatic hypotension   Discharge Condition: stable  Diet recommendation: carb modified  Filed Weights   11/21/14 1222 11/22/14 0612  Weight: 89.041 kg (196 lb 4.8 oz) 89.495 kg (197 lb 4.8 oz)    History of present illness:  Jared Bridges is a very pleasant 80 y.o. male with a past medical history that includes hypertension, diabetes, diverticulitis, anal fissure, remote PUD and arthritis taking NSAID presented to room 332 directly from PCPs office with the chief complaint of bright red blood per rectum. Patient stated that his last 2 bowel movements "were quite difficult" and upon cleaning there was bright red blood on the tissue. He denied any bloody stool any clots. He denied any dark stool or maroon stool. He reported he does take Advil fairly frequently for arthritis. He denied abdominal pain nausea vomiting. He denied any shortness of breath palpitations. He reported some "lightheadedness" this morning as he got up from his chair proceeded to walk up stairs. Indicated he's been managing orthostatic hypotension for the last several weeks his medications have been adjusted around this issue. He denied any chest pain lower extremity edema orthopnea. He reported his last colonoscopy was 2014 done at [facility]  and his daughter reports "he's on the 3 year plan". He had seen Dr. Laural Golden in the past. He denied fever chills dysuria hematuria frequency or urgency. Upon admission he is hemodynamically stable afebrile and not hypoxic.  Hospital Course:  BRBPR (bright red blood per rectum):History of NSAID use, remote history of peptic ulcer disease, and internal hemorrhoids, diverticulitis status post resection 1989. Hg remained stable. Evaluated by GI who opine likely related to hemorrhoids in setting of constipation. No evidence of recurrent bleed so no further work up needed. Recommended PPI and stool softener with fiber supplement. Follow up with PCP in 1-2 weeks.  Active Problems:  Essential hypertension: fair control.[name] Kitchen Hx orthostatic hypotension. Recent cardiology visit 12/17.    Type 2 diabetes mellitus: carb modified diet. A1C 7.1.     Anemia, iron deficiency: hx of same in past. Reports iron supplements "cause constipation". Hx PUD (remote). Has been on iron supplements in past. PCP to follow iron studies   Dizziness: chronic. Separate  Intermittently orthostatic with some falls and near syncope. Recent cardiology note indicates suspicion for autonomic dysfunction and medications. Effexor has been decreased and flomax discontinued and symptoms persist. Cardiology note indicates plan for 48 hour Holter monitor. Echo ordered as inpatient to be followed by cards on visit 12/18/14.     Procedures:  echo  Consultations:  Dr Laural Golden  Discharge Exam: Filed Vitals:   11/22/14 0832  BP: 152/70  Pulse: 80  Temp: 98.6 F (37 C)  Resp: 18    General: well nourished  Cardiovascular: RRR +murmur No LE edema Respiratory: normal effort BS clear bilaterally no wheeze  Discharge Instructions  Current Discharge Medication List    START taking these medications   Details  docusate sodium 100 MG CAPS Take 100 mg by mouth 2 (two) times daily. Qty: 10 capsule, Refills: 0    hydrocortisone  (ANUSOL-HC) 25 MG suppository Place 1 suppository (25 mg total) rectally at bedtime. Qty: 12 suppository, Refills: 0    polyethylene glycol (MIRALAX / GLYCOLAX) packet Take 17 g by mouth daily as needed for mild constipation. Qty: 14 each, Refills: 0      CONTINUE these medications which have CHANGED   Details  diazepam (VALIUM) 5 MG tablet Take 0.5 tablets (2.5 mg total) by mouth at bedtime as needed (SLEEP). Qty: 30 tablet, Refills: 0      CONTINUE these medications which have NOT CHANGED   Details  acetaminophen (TYLENOL) 500 MG tablet Take 1,000 mg by mouth every 6 (six) hours as needed for moderate pain.    diclofenac sodium (VOLTAREN) 1 % GEL Apply 2 g topically daily.    eszopiclone (LUNESTA) 2 MG TABS tablet Take 1 tablet by mouth at bedtime as needed for sleep.  Refills: 0    feeding supplement, GLUCERNA SHAKE, (GLUCERNA SHAKE) LIQD Take 237 mLs by mouth 2 (two) times daily between meals. Refills: 0    glimepiride (AMARYL) 1 MG tablet Take 1 tablet (1 mg total) by mouth daily as needed. Take if CBG >150 in the morning. Qty: 30 tablet, Refills: 0    HYDROcodone-acetaminophen (NORCO/VICODIN) 5-325 MG per tablet Take 1 tablet by mouth every 8 (eight) hours as needed for moderate pain. Qty: 30 tablet, Refills: 0    IRON PO Take 1 tablet by mouth daily.    LYRICA 50 MG capsule Take 1 capsule by mouth at bedtime.  Refills: 1    pantoprazole (PROTONIX) 40 MG tablet Take 1 tablet (40 mg total) by mouth daily. Qty: 30 tablet, Refills: 1    predniSONE (DELTASONE) 5 MG tablet Take 5 mg by mouth daily.  Refills: 1    venlafaxine (EFFEXOR) 37.5 MG tablet Take 75 mg by mouth at bedtime.      STOP taking these medications     Ibuprofen (ADVIL) 200 MG CAPS      venlafaxine XR (EFFEXOR-XR) 150 MG 24 hr capsule        Allergies  Allergen Reactions  . Cymbalta [Duloxetine Hcl] Other (See Comments)    Makes patient have jerking motions.  . Iodine Hives    very  allergic(per daughter)  . Lisinopril Other (See Comments)    Makes patient have jerking motions.  . Neurontin [Gabapentin] Other (See Comments)    Makes patient have jerking motions.  [name] Kitchen Zoloft [Sertraline Hcl] Other (See Comments)    Makes patient have jerking motions.   Follow-up Information    Follow up with Doree Albee, MD. Schedule an appointment as soon as possible for a visit in 2 weeks.   Specialty:  Internal Medicine   Why:  for evaluation of depression and medications and orthostasis   Contact information:   [place] [id number] [phone]        The results of significant diagnostics from this hospitalization (including imaging, microbiology, ancillary and laboratory) are listed below for reference.    Significant Diagnostic Studies: No results found.  Microbiology: No results found for this or any previous visit (from the past 240 hour(s)).   Labs: Basic Metabolic Panel:  Recent Labs Lab 11/21/14 1310 11/22/14 0637  NA 138 140  K 4.0  4.1  CL 105 103  CO2 27 29  GLUCOSE 151* 115*  BUN 26* 17  CREATININE 0.93 0.96  CALCIUM 9.0 9.4   Liver Function Tests:  Recent Labs Lab 11/21/14 1310  AST 25  ALT 41  ALKPHOS 75  BILITOT 0.4  PROT 6.7  ALBUMIN 3.6   No results for input(s): LIPASE, AMYLASE in the last 168 hours. No results for input(s): AMMONIA in the last 168 hours. CBC:  Recent Labs Lab 11/21/14 1310 11/21/14 1956 11/22/14 0637  WBC 15.8* 11.5* 13.3*  HGB 13.3 13.7 14.0  HCT 41.2 42.1 43.2  MCV 95.6 95.2 96.4  PLT 260 235 258   Cardiac Enzymes: No results for input(s): CKTOTAL, CKMB, CKMBINDEX, TROPONINI in the last 168 hours. BNP: BNP (last 3 results) No results for input(s): PROBNP in the last 8760 hours. CBG:  Recent Labs Lab 11/21/14 1608 11/21/14 2259 11/22/14 0724 11/22/14 1127  GLUCAP 149* 105* 104* 186*       Signed:  BLACK,KAREN M  [facility] 11/22/2014, 11:36  AM

## 2014-11-22 NOTE — Progress Notes (Signed)
  Echocardiogram 2D Echocardiogram has been performed.  [place], [place] 11/22/2014, 12:05 PM

## 2014-11-22 NOTE — Progress Notes (Signed)
We have received a consult to see pt today.  He was admitted with rectal bleed.  He has a long history of orthostatic hypotension which is being investigated by multiple entities.  His falls at home have been related to this hypotension.  Pt is at functional baseline to day.   He is up ad lib in the room eating breakfast.  I spent 30 minutes discussing the use of his assistive devices when his knee pain is severe (he has severe DJD of both knees).  We also discussed options of modifications in his home to assist him in negotiating his stairs at home.  I do not see any formal PT needs at this time and pt is in full agreement.  Please reconsult if there are other concerns that I have missed.

## 2014-11-22 NOTE — Progress Notes (Signed)
Discharge instructions reviewed with pt. No current questions or concerns. Will be leaving shortly

## 2014-11-23 LAB — H.PYLORI ANTIGEN, STOOL

## 2014-11-27 ENCOUNTER — Ambulatory Visit (HOSPITAL_COMMUNITY)
Admission: RE | Admit: 2014-11-27 | Discharge: 2014-11-27 | Disposition: A | Discharge status: Home / Self Care | Payer: Medicare Other | Source: Ambulatory Visit | Attending: Cardiology | Admitting: Cardiology

## 2014-11-27 ENCOUNTER — Other Ambulatory Visit (HOSPITAL_COMMUNITY): Payer: Medicare Other

## 2014-11-27 DIAGNOSIS — R55 Syncope and collapse: Secondary | ICD-10-CM | POA: Diagnosis not present

## 2014-11-27 DIAGNOSIS — R42 Dizziness and giddiness: Secondary | ICD-10-CM

## 2014-11-27 NOTE — Progress Notes (Signed)
48 Hour Holter Monitor in progress. 

## 2014-12-04 ENCOUNTER — Telehealth: Payer: Self-pay

## 2014-12-04 NOTE — Telephone Encounter (Signed)
Results given to wife,SR HR 55-107 rare PVC,PAC. No pauses,symptoms fid not correlate with any arrhythmia per Dr Harrington Challenger

## 2014-12-18 ENCOUNTER — Encounter: Payer: Self-pay | Admitting: Cardiology

## 2014-12-18 ENCOUNTER — Ambulatory Visit (INDEPENDENT_AMBULATORY_CARE_PROVIDER_SITE_OTHER): Payer: Medicare Other | Admitting: Cardiology

## 2014-12-18 VITALS — BP 160/86 | HR 74 | Ht 74.0 in | Wt 211.0 lb

## 2014-12-18 DIAGNOSIS — R42 Dizziness and giddiness: Secondary | ICD-10-CM

## 2014-12-18 DIAGNOSIS — E1143 Type 2 diabetes mellitus with diabetic autonomic (poly)neuropathy: Secondary | ICD-10-CM

## 2014-12-18 NOTE — Progress Notes (Signed)
Reason for visit: Follow-up dizziness  Clinical Summary Jared Bridges is an 81 y.o.male seen recently in December 2015. Records detailed in the prior note. He comes in today stating that he has been doing "fairly well." His wife unfortunately broke her hip recently, and is recuperating in a nursing facility now. He has had some stress associated with this, not unexpectedly. Dizziness does seem to be better by his account. Flomax has been stopped. He continues on Effexor.  48 hour Holter monitor showed sinus rhythm ranging from 55-107 bpm, no pauses, rare PACs and PVCs. No sustained arrhythmias. Echocardiogram from December 2015 showed moderate to severe LVH with LVEF 65-70%, hyperdynamic LV contraction with peak cavitary gradient 56 mmHg, grade 1 diastolic dysfunction, mild to moderate aortic regurgitation, mild left atrial enlargement. I reviewed the results with him today.  With his vigorous LVEF and LVH with intracavitary gradient, he is prone to dizziness with dehydration. We did discuss this as well. He is currently not on a diuretic.   Allergies  Allergen Reactions  . Cymbalta [Duloxetine Hcl] Other (See Comments)    Makes patient have jerking motions.  . Iodine Hives    very allergic(per daughter)  . Lisinopril Other (See Comments)    Makes patient have jerking motions.  . Neurontin [Gabapentin] Other (See Comments)    Makes patient have jerking motions.  [name] Kitchen Zoloft [Sertraline Hcl] Other (See Comments)    Makes patient have jerking motions.    Current Outpatient Prescriptions  Medication Sig Dispense Refill  . acetaminophen (TYLENOL) 500 MG tablet Take 1,000 mg by mouth every 6 (six) hours as needed for moderate pain.    . diazepam (VALIUM) 5 MG tablet Take 0.5 tablets (2.5 mg total) by mouth at bedtime as needed (SLEEP). 30 tablet 0  . diclofenac sodium (VOLTAREN) 1 % GEL Apply 2 g topically daily.    [name] Kitchen docusate sodium 100 MG CAPS Take 100 mg by mouth 2 (two) times daily. 10  capsule 0  . eszopiclone (LUNESTA) 2 MG TABS tablet Take 1 tablet by mouth at bedtime as needed for sleep.   0  . feeding supplement, GLUCERNA SHAKE, (GLUCERNA SHAKE) LIQD Take 237 mLs by mouth 2 (two) times daily between meals.  0  . HYDROcodone-acetaminophen (NORCO/VICODIN) 5-325 MG per tablet Take 1 tablet by mouth every 8 (eight) hours as needed for moderate pain. (Patient taking differently: Take 0.5-1 tablets by mouth every 6 (six) hours as needed (MIGRAINE). ) 30 tablet 0  . LYRICA 50 MG capsule Take 1 capsule by mouth at bedtime.   1  . pantoprazole (PROTONIX) 40 MG tablet Take 1 tablet (40 mg total) by mouth daily. 30 tablet 1  . polyethylene glycol (MIRALAX / GLYCOLAX) packet Take 17 g by mouth daily as needed for mild constipation. 14 each 0  . predniSONE (DELTASONE) 5 MG tablet Take 5 mg by mouth daily.   1  . venlafaxine (EFFEXOR) 37.5 MG tablet Take 37.5 mg by mouth 2 (two) times daily with a meal.      No current facility-administered medications for this visit.    Past Medical History  Diagnosis Date  . Chronic headaches   . Type 2 diabetes mellitus   . Essential hypertension   . Colon cancer   . Diverticulitis   . Syncope   . Hypercholesteremia   . GI bleed     Dr. Laural Golden - 1998  . Peptic ulcer disease   . Anemia, iron deficiency   . Depression  Social History Jared Bridges reports that he quit smoking about 26 years ago. His smoking use included Cigarettes. He started smoking about 56 years ago. He smoked 0.10 packs per day. He has never used smokeless tobacco. Jared Bridges reports that he does not drink alcohol.  Review of Systems Complete review of systems negative except as otherwise outlined in the clinical summary and also the following. No palpitations or frank syncope since I saw him. Appetite is fair.  Physical Examination Filed Vitals:   12/18/14 1353  BP: 160/86  Pulse: 74   Filed Weights   12/18/14 1353  Weight: 211 lb (95.709 kg)    Patient  appears comfortable at rest. HEENT: Conjunctiva and lids normal, oropharynx clear. Neck: Supple, no elevated JVP, no carotid bruits, no thyromegaly. Lungs: Clear to auscultation, nonlabored breathing at rest. Cardiac: Regular rate and rhythm, no S3, 2/6 basal systolic murmur, no pericardial rub. Abdomen: Soft, nontender, bowel sounds present, no guarding or rebound. Extremities: Trace ankle edema, distal pulses 2+.    Problem List and Plan   Dizziness Suspect combination of autonomic dysfunction, and also contributed to somewhat by LVH with vigorous LVEF and intracavitary gradient. No arrhythmias by cardiac monitoring. He is not on diuretics at this time. Symptoms have been better off Flomax. For now would continue with observation.   Type 2 diabetes mellitus Keep follow-up with Dr. Anastasio Champion.     [name], M.D., F.A.C.C.

## 2014-12-18 NOTE — Assessment & Plan Note (Signed)
Keep follow-up with Dr. Anastasio Champion.

## 2014-12-18 NOTE — Assessment & Plan Note (Addendum)
Suspect combination of autonomic dysfunction, and also contributed to somewhat by LVH with vigorous LVEF and intracavitary gradient. No arrhythmias by cardiac monitoring. He is not on diuretics at this time. Symptoms have been better off Flomax. For now would continue with observation.

## 2014-12-18 NOTE — Patient Instructions (Signed)
Your physician wants you to follow-up in:  3-4 months with Dr.McDowell You will receive a reminder letter in the mail two months in advance. If you don't receive a letter, please call our office to schedule the follow-up appointment.      Your physician recommends that you continue on your current medications as directed. Please refer to the Current Medication list given to you today.      Thank you for choosing [facility] !

## 2015-01-31 ENCOUNTER — Ambulatory Visit (INDEPENDENT_AMBULATORY_CARE_PROVIDER_SITE_OTHER): Payer: Medicare Other | Admitting: Psychiatry

## 2015-01-31 ENCOUNTER — Encounter (HOSPITAL_COMMUNITY): Payer: Self-pay | Admitting: Psychiatry

## 2015-01-31 VITALS — BP 139/68 | HR 74 | Ht 74.0 in | Wt 209.0 lb

## 2015-01-31 DIAGNOSIS — F332 Major depressive disorder, recurrent severe without psychotic features: Secondary | ICD-10-CM

## 2015-01-31 MED ORDER — VENLAFAXINE HCL ER 75 MG PO CP24: ORAL_CAPSULE | ORAL | Status: DC

## 2015-01-31 MED ORDER — SUVOREXANT 10 MG PO TABS: 10.0000 mg | ORAL_TABLET | Freq: Every day | ORAL | Status: DC

## 2015-01-31 NOTE — Progress Notes (Signed)
Psychiatric Assessment Adult  Patient Identification:  Jared Bridges Date of Evaluation:  01/31/2015 Chief Complaint: I've had recurrent bouts of depression History of Chief Complaint:   Chief Complaint  Patient presents with  . Depression  . Establish Care    HPI this patient is an 81 year old married white male who lives with his wife and daughter in [place]. He is a retired Scientific laboratory technician. The patient was referred by his primary physician, Dr. [name], for further assessment treatment of depression.  The patient states that he's had depression on and off over the years. He has brother and father owns several businesses. When he was 40 years old his father's businesses got into financial trouble and he and his brother had to buy them out. This was very difficult for him financially and personally and he went through a severe depressive episode at that time. He has been on and off medications for many years and he doesn't recall the names. He had ECT done at [facility] 2 years ago but he does not feel like it was helpful. Most recently he seen a doctor at [facility] who has put him on Effexor and it does seem to be working. He got increasingly depressed recently because his wife broke her hip in early January and had to go into rehabilitation in a nursing home. He has always had difficulty sleeping and this seems to be worse recently as well.  The patient also has had numerous health problems. He's had peripheral neuropathy for unknown reasons. Lately his walking is gotten worse and he is using a cane. He's had numerous knee surgeries back surgery. His balance is not the best. He is upset because his daughters had to leave her job to help take care of himself and his wife. He denies being suicidal but has down periods at times. Currently sitting on Effexor 225 mg a day and is gradually trying to get up to 300 mg daily. He is on Lunesta but it's not helping sleep. Valium sometimes helpful causes a hangover. He's  never been suicidal or psychotic and does not use drugs or alcohol .  Review of Systems  Constitutional: Positive for activity change and appetite change.  Eyes: Negative.   Respiratory: Negative.   Cardiovascular: Negative.   Gastrointestinal: Negative.   Endocrine: Negative.   Genitourinary: Negative.   Musculoskeletal: Positive for joint swelling, arthralgias and gait problem.  Skin: Negative.   Allergic/Immunologic: Negative.   Neurological: Positive for tremors and headaches.  Hematological: Negative.   Psychiatric/Behavioral: Positive for sleep disturbance and dysphoric mood.   Physical Examnot done  Depressive Symptoms: depressed mood, anhedonia, insomnia, psychomotor retardation, fatigue, loss of energy/fatigue, weight loss,  (Hypo) Manic Symptoms:   Elevated Mood:  No Irritable Mood:  No Grandiosity:  No Distractibility:  No Labiality of Mood:  No Delusions:  No Hallucinations:  No Impulsivity:  No Sexually Inappropriate Behavior:  No Financial Extravagance:  No Flight of Ideas:  No  Anxiety Symptoms: Excessive Worry:  Yes Panic Symptoms:  No Agoraphobia:  No Obsessive Compulsive: No  Symptoms: None, Specific Phobias:  No Social Anxiety:  No  Psychotic Symptoms:  Hallucinations: No None Delusions:  No Paranoia:  No   Ideas of Reference:  No  PTSD Symptoms: Ever had a traumatic exposure:  No Had a traumatic exposure in the last month:  No Re-experiencing: No None Hypervigilance:  No Hyperarousal: No None Avoidance: No None  Traumatic Brain Injury: Yes Sports Related  Past Psychiatric History: Diagnosis: Major  depression   Hospitalizations: none  Outpatient Care: Primarily at [facility]   Substance Abuse Care: none  Self-Mutilation:none  Suicidal Attempts: none  Violent Behaviors: none   Past Medical History:   Past Medical History  Diagnosis Date  . Chronic headaches   . Type 2 diabetes mellitus   . Essential hypertension   . Colon  cancer   . Diverticulitis   . Syncope   . Hypercholesteremia   . GI bleed     Dr. Laural Golden - 1998  . Peptic ulcer disease   . Anemia, iron deficiency   . Depression   . Chronic kidney disease   . Peripheral neuropathy    History of Loss of Consciousness:  Yes Seizure History:  No Cardiac History:  Yes Allergies:   Allergies  Allergen Reactions  . Cymbalta [Duloxetine Hcl] Other (See Comments)    Makes patient have jerking motions.  . Iodine Hives    very allergic(per daughter)  . Lisinopril Other (See Comments)    Makes patient have jerking motions.  . Neurontin [Gabapentin] Other (See Comments)    Makes patient have jerking motions.  [name] Kitchen Zoloft [Sertraline Hcl] Other (See Comments)    Makes patient have jerking motions.   Current Medications:  Current Outpatient Prescriptions  Medication Sig Dispense Refill  . acetaminophen (TYLENOL) 500 MG tablet Take 1,000 mg by mouth every 6 (six) hours as needed for moderate pain.    . diazepam (VALIUM) 5 MG tablet Take 0.5 tablets (2.5 mg total) by mouth at bedtime as needed (SLEEP). 30 tablet 0  . diclofenac sodium (VOLTAREN) 1 % GEL Apply 2 g topically daily.    . eszopiclone (LUNESTA) 2 MG TABS tablet Take 1 tablet by mouth at bedtime as needed for sleep.   0  . glimepiride (AMARYL) 1 MG tablet Take 1 mg by mouth daily with breakfast.    . HYDROcodone-acetaminophen (NORCO/VICODIN) 5-325 MG per tablet Take 1 tablet by mouth every 8 (eight) hours as needed for moderate pain. (Patient taking differently: Take 0.5-1 tablets by mouth every 6 (six) hours as needed (MIGRAINE). ) 30 tablet 0  . ibuprofen (ADVIL,MOTRIN) 200 MG tablet Take 400 mg by mouth every 6 (six) hours as needed.    [name] Kitchen LYRICA 50 MG capsule Take 1 capsule by mouth at bedtime.   1  . pantoprazole (PROTONIX) 40 MG tablet Take 1 tablet (40 mg total) by mouth daily. 30 tablet 1  . predniSONE (DELTASONE) 5 MG tablet Take 5 mg by mouth daily.   1  . docusate sodium 100 MG CAPS Take  100 mg by mouth 2 (two) times daily. (Patient not taking: Reported on 01/31/2015) 10 capsule 0  . feeding supplement, GLUCERNA SHAKE, (GLUCERNA SHAKE) LIQD Take 237 mLs by mouth 2 (two) times daily between meals.  0  . polyethylene glycol (MIRALAX / GLYCOLAX) packet Take 17 g by mouth daily as needed for mild constipation. 14 each 0  . Suvorexant (BELSOMRA) 10 MG TABS Take 10 mg by mouth at bedtime. 30 tablet 2  . venlafaxine XR (EFFEXOR XR) 75 MG 24 hr capsule Take one in the am and two at bedtime 90 capsule 2   No current facility-administered medications for this visit.    Previous Psychotropic Medications:  Medication Dose                         Substance Abuse History in the last 12 months: Substance Age of 1st Use Last  Use Amount Specific Type  Nicotine      Alcohol      Cannabis      Opiates      Cocaine      Methamphetamines      LSD      Ecstasy      Benzodiazepines      Caffeine      Inhalants      Others:                          Medical Consequences of Substance Abuse: none  Legal Consequences of Substance Abuse: none  Family Consequences of Substance Abuse: none Blackouts:  No DT's:  No Withdrawal Symptoms:  No None  Social History: Current Place of Residence: [place] of Birth: [place]  Family Members: Wife and daughter Marital Status:  Married Children:   Sons:   Daughters: 1 Relationships: Education:  Soil scientist Problems/Performance:  Religious Beliefs/Practices: Christian History of Abuse: none Pensions consultant; Land Economist History:  Tax inspector History:  Hobbies/Interests: Woodworking   Family History:   Family History  Problem Relation Age of Onset  . Hypertension    . Depression Brother   . Depression Daughter     Mental Status Examination/Evaluation: Objective:  Appearance: Casual, Neat and Well Groomed  Eye Contact::  Good  Speech:  Clear and Coherent  Volume:   Normal  Mood:  Fairly good, mildly depressed   Affect:  Appropriate  Thought Process:  Circumstantial and Goal Directed  Orientation:  Full (Time, Place, and Person)  Thought Content:  Rumination  Suicidal Thoughts:  No  Homicidal Thoughts:  No  Judgement:  Good  Insight:  Good  Psychomotor Activity:  Decreased  Akathisia:  No  Handed:  Right  AIMS (if indicated):    Assets:  Communication Skills Desire for Improvement Resilience Social Support Talents/Skills Vocational/Educational    Laboratory/X-Ray Psychological Evaluation(s)   Blood sugar moderately elevated on last reading      Assessment:  Axis I: Major Depression, Recurrent severe  AXIS I Major Depression, Recurrent severe  AXIS II Deferred  AXIS III Past Medical History  Diagnosis Date  . Chronic headaches   . Type 2 diabetes mellitus   . Essential hypertension   . Colon cancer   . Diverticulitis   . Syncope   . Hypercholesteremia   . GI bleed     Dr. Laural Golden - 1998  . Peptic ulcer disease   . Anemia, iron deficiency   . Depression   . Chronic kidney disease   . Peripheral neuropathy      AXIS IV other psychosocial or environmental problems  AXIS V 51-60 moderate symptoms   Treatment Plan/Recommendations:  Plan of Care: Medication management   Laboratory:   Psychotherapy: He is not sure about this at this time   Medications: I will change him over to Effexor XR 75 mg-one in the morning and 2 at bedtime to streamline his medications for depression. He will start Belsomra 10 mg daily at bedtime for insomnia   Routine PRN Medications:  No  Consultations:   Safety Concerns:  He denies thoughts of harming self or others   Other:  He'll return in 4 weeks    Levonne Spiller, MD 3/2/20164:09 PM

## 2015-02-02 ENCOUNTER — Telehealth (HOSPITAL_COMMUNITY): Payer: Self-pay | Admitting: *Deleted

## 2015-02-02 ENCOUNTER — Other Ambulatory Visit (HOSPITAL_COMMUNITY): Payer: Self-pay | Admitting: Psychiatry

## 2015-02-02 MED ORDER — DIAZEPAM 5 MG PO TABS: 5.0000 mg | ORAL_TABLET | Freq: Every evening | ORAL | Status: DC | PRN

## 2015-02-02 NOTE — Telephone Encounter (Signed)
Forward to [place], tell him to stop it

## 2015-02-02 NOTE — Telephone Encounter (Signed)
Pt is aware that he have to stop his Belsomra. Pt showed understanding. Per pt he would like to see what other medication Dr. Harrington Challenger would recommend him to try taking to help him sleep. Pt phone number is [phone]

## 2015-02-02 NOTE — Telephone Encounter (Signed)
phone call from patient, he took the [place] and now he has a real big headache.

## 2015-03-07 ENCOUNTER — Encounter (HOSPITAL_COMMUNITY): Payer: Self-pay | Admitting: Psychiatry

## 2015-03-07 ENCOUNTER — Telehealth: Payer: Self-pay | Admitting: *Deleted

## 2015-03-07 ENCOUNTER — Ambulatory Visit (INDEPENDENT_AMBULATORY_CARE_PROVIDER_SITE_OTHER): Payer: Medicare Other | Admitting: Psychiatry

## 2015-03-07 VITALS — BP 131/59 | HR 75 | Ht 74.0 in | Wt 210.0 lb

## 2015-03-07 DIAGNOSIS — F332 Major depressive disorder, recurrent severe without psychotic features: Secondary | ICD-10-CM | POA: Diagnosis not present

## 2015-03-07 MED ORDER — DIAZEPAM 5 MG PO TABS: 5.0000 mg | ORAL_TABLET | Freq: Two times a day (BID) | ORAL | Status: DC

## 2015-03-07 MED ORDER — VENLAFAXINE HCL ER 150 MG PO CP24: 150.0000 mg | ORAL_CAPSULE | Freq: Two times a day (BID) | ORAL | Status: DC

## 2015-03-07 NOTE — Progress Notes (Signed)
Patient ID: Jared Bridges, male   DOB: 1929-12-24, 81 y.o.   MRN: [id number]  Psychiatric Assessment Adult  Patient Identification:  Jared Bridges Date of Evaluation:  03/07/2015 Chief Complaint: I've had recurrent bouts of depression History of Chief Complaint:   Chief Complaint  Patient presents with  . Depression  . Follow-up    HPI this patient is an 81 year old married white male who lives with his wife and daughter in [place]. He is a retired Scientific laboratory technician. The patient was referred by his primary physician, Dr. [name], for further assessment treatment of depression.  The patient states that he's had depression on and off over the years. He has brother and father owns several businesses. When he was 81 years old his father's businesses got into financial trouble and he and his brother had to buy them out. This was very difficult for him financially and personally and he went through a severe depressive episode at that time. He has been on and off medications for many years and he doesn't recall the names. He had ECT done at [facility] 2 years ago but he does not feel like it was helpful. Most recently he seen a doctor at [facility] who has put him on Effexor and it does seem to be working. He got increasingly depressed recently because his wife broke her hip in early January and had to go into rehabilitation in a nursing home. He has always had difficulty sleeping and this seems to be worse recently as well.  The patient also has had numerous health problems. He's had peripheral neuropathy for unknown reasons. Lately his walking is gotten worse and he is using a cane. He's had numerous knee surgeries back surgery. His balance is not the best. He is upset because his daughters had to leave her job to help take care of himself and his wife. He denies being suicidal but has down periods at times. Currently sitting on Effexor 225 mg a day and is gradually trying to get up to 300 mg daily. He is on Lunesta but  it's not helping sleep. Valium sometimes helpful causes a hangover. He's never been suicidal or psychotic and does not use drugs or alcohol .  The patient returns after four-week's. Last time we tried adding Belsomra to help him sleep but it made him too groggy. He finds that if he takes one Valium after dinner and another one at bedtime he sleeps pretty well. He is frustrated because he has numerous medical problems including peripheral neuropathy bad knees and orthostatic hypotension which make it difficult for him to do the things he wants to do. He doesn't want to give up however and is planning on having knee replacements. He still feels depressed but part of this is due to the fact that he has these medical problems. I suggested we go to the maximum dose of Effexor XR which would be 300 mg daily and he agrees  Review of Systems  Constitutional: Positive for activity change and appetite change.  Eyes: Negative.   Respiratory: Negative.   Cardiovascular: Negative.   Gastrointestinal: Negative.   Endocrine: Negative.   Genitourinary: Negative.   Musculoskeletal: Positive for joint swelling, arthralgias and gait problem.  Skin: Negative.   Allergic/Immunologic: Negative.   Neurological: Positive for tremors and headaches.  Hematological: Negative.   Psychiatric/Behavioral: Positive for sleep disturbance and dysphoric mood.   Physical Examnot done  Depressive Symptoms: depressed mood, anhedonia, insomnia, psychomotor retardation, fatigue, loss of energy/fatigue, weight loss,  (  Hypo) Manic Symptoms:   Elevated Mood:  No Irritable Mood:  No Grandiosity:  No Distractibility:  No Labiality of Mood:  No Delusions:  No Hallucinations:  No Impulsivity:  No Sexually Inappropriate Behavior:  No Financial Extravagance:  No Flight of Ideas:  No  Anxiety Symptoms: Excessive Worry:  Yes Panic Symptoms:  No Agoraphobia:  No Obsessive Compulsive: No  Symptoms: None, Specific  Phobias:  No Social Anxiety:  No  Psychotic Symptoms:  Hallucinations: No None Delusions:  No Paranoia:  No   Ideas of Reference:  No  PTSD Symptoms: Ever had a traumatic exposure:  No Had a traumatic exposure in the last month:  No Re-experiencing: No None Hypervigilance:  No Hyperarousal: No None Avoidance: No None  Traumatic Brain Injury: Yes Sports Related  Past Psychiatric History: Diagnosis: Major depression   Hospitalizations: none  Outpatient Care: Primarily at [facility]   Substance Abuse Care: none  Self-Mutilation:none  Suicidal Attempts: none  Violent Behaviors: none   Past Medical History:   Past Medical History  Diagnosis Date  . Chronic headaches   . Type 2 diabetes mellitus   . Essential hypertension   . Colon cancer   . Diverticulitis   . Syncope   . Hypercholesteremia   . GI bleed     Dr. Laural Golden - 1998  . Peptic ulcer disease   . Anemia, iron deficiency   . Depression   . Chronic kidney disease   . Peripheral neuropathy    History of Loss of Consciousness:  Yes Seizure History:  No Cardiac History:  Yes Allergies:   Allergies  Allergen Reactions  . Cymbalta [Duloxetine Hcl] Other (See Comments)    Makes patient have jerking motions.  . Iodine Hives    very allergic(per daughter)  . Lisinopril Other (See Comments)    Makes patient have jerking motions.  . Neurontin [Gabapentin] Other (See Comments)    Makes patient have jerking motions.  [name] Kitchen Zoloft [Sertraline Hcl] Other (See Comments)    Makes patient have jerking motions.   Current Medications:  Current Outpatient Prescriptions  Medication Sig Dispense Refill  . acetaminophen (TYLENOL) 500 MG tablet Take 1,000 mg by mouth every 6 (six) hours as needed for moderate pain.    . diazepam (VALIUM) 5 MG tablet Take 1 tablet (5 mg total) by mouth 2 (two) times daily. 60 tablet 2  . diclofenac sodium (VOLTAREN) 1 % GEL Apply 2 g topically daily.    . feeding supplement, GLUCERNA SHAKE, (GLUCERNA  SHAKE) LIQD Take 237 mLs by mouth 2 (two) times daily between meals.  0  . glimepiride (AMARYL) 1 MG tablet Take 1 mg by mouth daily as needed.     [name] Kitchen HYDROcodone-acetaminophen (NORCO/VICODIN) 5-325 MG per tablet Take 1 tablet by mouth every 8 (eight) hours as needed for moderate pain. (Patient taking differently: Take 0.5-1 tablets by mouth every 6 (six) hours as needed (MIGRAINE). ) 30 tablet 0  . ibuprofen (ADVIL,MOTRIN) 200 MG tablet Take 400 mg by mouth every 6 (six) hours as needed.    [name] Kitchen LYRICA 50 MG capsule Take 1 capsule by mouth at bedtime.   1  . pantoprazole (PROTONIX) 40 MG tablet Take 1 tablet (40 mg total) by mouth daily. 30 tablet 1  . polyethylene glycol (MIRALAX / GLYCOLAX) packet Take 17 g by mouth daily as needed for mild constipation. 14 each 0  . predniSONE (DELTASONE) 5 MG tablet Take 5 mg by mouth daily.   1  . docusate  sodium 100 MG CAPS Take 100 mg by mouth 2 (two) times daily. (Patient not taking: Reported on 01/31/2015) 10 capsule 0  . eszopiclone (LUNESTA) 2 MG TABS tablet Take 1 tablet by mouth at bedtime as needed for sleep.   0  . venlafaxine XR (EFFEXOR XR) 150 MG 24 hr capsule Take 1 capsule (150 mg total) by mouth 2 (two) times daily. 60 capsule 2   No current facility-administered medications for this visit.    Previous Psychotropic Medications:  Medication Dose                         Substance Abuse History in the last 12 months: Substance Age of 1st Use Last Use Amount Specific Type  Nicotine      Alcohol      Cannabis      Opiates      Cocaine      Methamphetamines      LSD      Ecstasy      Benzodiazepines      Caffeine      Inhalants      Others:                          Medical Consequences of Substance Abuse: none  Legal Consequences of Substance Abuse: none  Family Consequences of Substance Abuse: none Blackouts:  No DT's:  No Withdrawal Symptoms:  No None  Social History: Current Place of Residence: [place] of Birth: [place]  Family Members: Wife and daughter Marital Status:  Married Children:   Sons:   Daughters: 1 Relationships: Education:  Soil scientist Problems/Performance:  Religious Beliefs/Practices: Christian History of Abuse: none Pensions consultant; Land Economist History:  Tax inspector History:  Hobbies/Interests: Woodworking   Family History:   Family History  Problem Relation Age of Onset  . Hypertension    . Depression Brother   . Depression Daughter     Mental Status Examination/Evaluation: Objective:  Appearance: Casual, Neat and Well Groomed  Eye Contact::  Good  Speech:  Clear and Coherent  Volume:  Normal  Mood:  Fairly good, mildly depressed   Affect:  Appropriate  Thought Process:  Circumstantial and Goal Directed  Orientation:  Full (Time, Place, and Person)  Thought Content:  Rumination  Suicidal Thoughts:  No  Homicidal Thoughts:  No  Judgement:  Good  Insight:  Good  Psychomotor Activity:  Decreased  Akathisia:  No  Handed:  Right  AIMS (if indicated):    Assets:  Communication Skills Desire for Improvement Resilience Social Support Talents/Skills Vocational/Educational    Laboratory/X-Ray Psychological Evaluation(s)   Blood sugar moderately elevated on last reading      Assessment:  Axis I: Major Depression, Recurrent severe  AXIS I Major Depression, Recurrent severe  AXIS II Deferred  AXIS III Past Medical History  Diagnosis Date  . Chronic headaches   . Type 2 diabetes mellitus   . Essential hypertension   . Colon cancer   . Diverticulitis   . Syncope   . Hypercholesteremia   . GI bleed     Dr. Laural Golden - 1998  . Peptic ulcer disease   . Anemia, iron deficiency   . Depression   . Chronic kidney disease   . Peripheral neuropathy      AXIS IV other psychosocial or environmental problems  AXIS V 51-60 moderate symptoms   Treatment  Plan/Recommendations:  Plan of Care: Medication  management   Laboratory:   Psychotherapy: He is not sure about this at this time   Medications he will increase Effexor XR to 150 mg twice a day. He will continue Valium 5 mg twice a day   Routine PRN Medications:  No  Consultations:   Safety Concerns:  He denies thoughts of harming self or others   Other:  He'll return in 4 weeks    Levonne Spiller, MD 4/6/20163:58 PM

## 2015-03-07 NOTE — Telephone Encounter (Signed)
Daughter called requesting an office visit today due to patient c/o chest pain. Spoke with patient and he c/o pain on the left side of his chest especially when breathing. Patient said he notices it more at night when sleeping and feels the pain under his left breast. Patient says he is more aware of it when he takes deep breaths and the pain has been constant for the last 2-3 weeks. Patient rated the pain #3 on a scale of 1-10 (10 being the greatest). Patient said he has noticed some increased dizziness but says he has orthostatic hypotension, no sob. Patient said that he fell a while ago and the doctor told him he may have fractured some ribs. Patient is unsure if the pain is coming from his heart, lungs or ribs but is requesting an office visit to rule out it being a heart issue. Patient said he did not have nitroglycerin on hand. Patient given an appointment with Domenic Polite at the [place] office on tomorrow.

## 2015-03-08 ENCOUNTER — Ambulatory Visit (HOSPITAL_COMMUNITY)
Admission: RE | Admit: 2015-03-08 | Discharge: 2015-03-08 | Disposition: A | Discharge status: Home / Self Care | Payer: Medicare Other | Source: Ambulatory Visit | Attending: Cardiology | Admitting: Cardiology

## 2015-03-08 ENCOUNTER — Encounter: Payer: Self-pay | Admitting: Cardiology

## 2015-03-08 ENCOUNTER — Telehealth: Payer: Self-pay

## 2015-03-08 ENCOUNTER — Ambulatory Visit (INDEPENDENT_AMBULATORY_CARE_PROVIDER_SITE_OTHER): Payer: Medicare Other | Admitting: Cardiology

## 2015-03-08 VITALS — BP 160/84 | HR 70 | Ht 74.0 in | Wt 209.0 lb

## 2015-03-08 DIAGNOSIS — R079 Chest pain, unspecified: Secondary | ICD-10-CM | POA: Insufficient documentation

## 2015-03-08 DIAGNOSIS — R0789 Other chest pain: Secondary | ICD-10-CM | POA: Diagnosis not present

## 2015-03-08 DIAGNOSIS — R072 Precordial pain: Secondary | ICD-10-CM | POA: Diagnosis not present

## 2015-03-08 DIAGNOSIS — I951 Orthostatic hypotension: Secondary | ICD-10-CM

## 2015-03-08 MED ORDER — AZITHROMYCIN 250 MG PO TABS: ORAL_TABLET | ORAL | Status: DC

## 2015-03-08 NOTE — Patient Instructions (Signed)
Your physician recommends that you schedule a follow-up appointment in: July with [facility]  Your physician recommends that you continue on your current medications as directed. Please refer to the Current Medication list given to you today.     Please get chest x-ray today    Thank you for choosing [facility] !

## 2015-03-08 NOTE — Progress Notes (Signed)
Cardiology Office Note  Date: 03/08/2015   ID: [name], DOB 12-Mar-1930, MRN [id number]  PCP: Doree Albee, MD  Primary Cardiologist: Rozann Lesches, MD   Chief Complaint  Patient presents with  . Chest Pain    History of Present Illness: Jared Bridges is an 81 y.o. male last seen in January. He was added on to the schedule today after a phone call to the [place] office yesterday. Telephone notes reviewed. He is here with his daughter today. Reports a 4-8 week history of pleuritic left lower costal discomfort. Worse when he takes a deep breath in. No associated cough, fevers, or chills. No increased sense of shortness of breath. He also has left shoulder discomfort mainly when he moves his arm up over the level of his shoulder, or when he sleeps on his left side. His pleuritic chest pain is not induced by exertion. He tells me that he had a an injury about 2 years ago with sternal fracture and other compression fractures as well as rib fractures on the left side.  Follow-up ECG today showed sinus rhythm with left anterior fascicular block as noted previously, poor R wave progression. No acute ST segment changes.  He did have a chest x-ray last year which is outlined below.   Past Medical History  Diagnosis Date  . Chronic headaches   . Type 2 diabetes mellitus   . Essential hypertension   . Colon cancer   . Diverticulitis   . Syncope   . Hypercholesteremia   . GI bleed     Dr. Laural Golden - 1998  . Peptic ulcer disease   . Anemia, iron deficiency   . Depression   . Chronic kidney disease   . Peripheral neuropathy      Current Outpatient Prescriptions  Medication Sig Dispense Refill  . acetaminophen (TYLENOL) 500 MG tablet Take 1,000 mg by mouth every 6 (six) hours as needed for moderate pain.    . cholecalciferol (VITAMIN D) 400 UNITS TABS tablet Take 2,000 Units by mouth daily.    . diazepam (VALIUM) 5 MG tablet Take 1 tablet (5 mg total) by mouth 2 (two) times  daily. 60 tablet 2  . feeding supplement, GLUCERNA SHAKE, (GLUCERNA SHAKE) LIQD Take 237 mLs by mouth 2 (two) times daily between meals.  0  . glimepiride (AMARYL) 1 MG tablet Take 1 mg by mouth daily as needed.     [name] Kitchen HYDROcodone-acetaminophen (NORCO/VICODIN) 5-325 MG per tablet Take 1 tablet by mouth every 8 (eight) hours as needed for moderate pain. (Patient taking differently: Take 0.5-1 tablets by mouth every 6 (six) hours as needed (MIGRAINE). ) 30 tablet 0  . ibuprofen (ADVIL,MOTRIN) 200 MG tablet Take 400 mg by mouth every 6 (six) hours as needed.    [name] Kitchen LYRICA 50 MG capsule Take 1 capsule by mouth at bedtime.   1  . pantoprazole (PROTONIX) 40 MG tablet Take 1 tablet (40 mg total) by mouth daily. 30 tablet 1  . predniSONE (DELTASONE) 5 MG tablet Take 5 mg by mouth daily.   1  . venlafaxine XR (EFFEXOR XR) 150 MG 24 hr capsule Take 1 capsule (150 mg total) by mouth 2 (two) times daily. (Patient taking differently: Take 150 mg by mouth 2 (two) times daily. Taking 187.5 mg daily) 60 capsule 2  . diclofenac sodium (VOLTAREN) 1 % GEL Apply 2 g topically daily.     No current facility-administered medications for this visit.    Allergies:  Cymbalta; Iodine; Lisinopril; Neurontin; and Zoloft   Social History: The patient  reports that he quit smoking about 26 years ago. His smoking use included Cigarettes. He started smoking about 56 years ago. He smoked 0.10 packs per day. He has never used smokeless tobacco. He reports that he does not drink alcohol or use illicit drugs.    ROS:  Please see the history of present illness. Otherwise, complete review of systems is positive for intermittent orthostatic dizziness as before.  All other systems are reviewed and negative.   Physical Exam: VS:  BP 160/84 mmHg  Pulse 70  Ht 6\' 2"  (1.88 m)  Wt 209 lb (94.802 kg)  BMI 26.82 kg/m2  SpO2 98%, BMI Body mass index is 26.82 kg/(m^2).  Wt Readings from Last 3 Encounters:  03/08/15 209 lb (94.802 kg)    03/07/15 210 lb (95.255 kg)  01/31/15 209 lb (94.802 kg)     Patient appears comfortable at rest. HEENT: Conjunctiva and lids normal, oropharynx clear. Neck: Supple, no elevated JVP, no carotid bruits, no thyromegaly. Lungs: Clear to auscultation, nonlabored breathing at rest. No pleural rub. Cardiac: Regular rate and rhythm, no S3, 2/6 basal systolic murmur, no pericardial rub. Abdomen: Soft, nontender, bowel sounds present, no guarding or rebound. Extremities: Trace ankle edema, distal pulses 2+.   ECG: ECG is ordered today and reviewed showing normal sinus rhythm with left anterior fascicular block, poor R-wave progression, no acute ST segment changes.  Recent Labwork: 08/16/2014: TSH 1.140 11/21/2014: ALT 41; AST 25 11/22/2014: BUN 17; Creatinine 0.96; Hemoglobin 14.0; Platelets 258; Potassium 4.1; Sodium 140  No results found for: CHOL, TRIG, HDL, CHOLHDL, VLDL, LDLCALC, LDLDIRECT  Other Studies Reviewed Today:  Echocardiogram 11/22/2014: Study Conclusions  - Left ventricle: The cavity size was normal. Wall thickness was increased in a pattern of moderate to severe LVH. Systolic function was vigorous. The estimated ejection fraction was in the range of 65% to 70%. There is a subaortic gradient of 56 mmHg created by the hyperdynamic LV and cavity obliteration. There was dynamic obstruction, with a peak velocity of 374 cm/sec and a peak gradient of 56 mm Hg. Doppler parameters are consistent with abnormal left ventricular relaxation (grade 1 diastolic dysfunction). - Aortic valve: Mildly calcified annulus. Trileaflet; mildly thickened leaflets. There was mild to moderate regurgitation. Valve area (VTI): 2.67 cm^2. Valve area (Vmax): 2.86 cm^2. Valve area (Vmean): 3.02 cm^2. Regurgitation pressure half-time: 555 ms. - Mitral valve: Mildly calcified annulus. - Left atrium: The atrium was mildly dilated. - Systemic veins: IVC is small suggestive of  low RA pressures and hypovolemia. - Technically difficult study.  Chest x-ray 10/15/2014: COMPARISON: 08/17/2014  FINDINGS: Mild hyperinflation compatible with background COPD/ emphysema. Normal heart size vascularity. No superimposed pneumonia, collapse or consolidation. Negative for edema, more effusion or pneumothorax. Trachea midline. Atherosclerosis of the aorta. Degenerative changes of the spine.  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   ASSESSMENT AND PLAN:  1. Atypical chest pain as outlined above. Unlikely to be of cardiac etiology based on description. ECG shows no acute changes. We will obtain a PA and lateral chest x-ray to exclude any other acute process.  2. History of orthostatic hypotension/dizziness in the setting of autonomic dysfunction and also LVH with vigorous LVEF and intracavitary gradient. Symptoms are stable on current treatment.  Current medicines were reviewed at length with the patient today. No changes were made.   Orders Placed This Encounter  Procedures  . DG Chest 2  View  . EKG 12-Lead    Disposition: FU with me in 3 months.   Signed, [name], MD, [facility] 03/08/2015 8:50 AM    [place] [facility] [address] [place], [place] [address] Phone: [phone]; Fax: [phone]

## 2015-03-08 NOTE — Telephone Encounter (Signed)
Spoke with daughter,escribed rx,they will fu with pcp

## 2015-03-08 NOTE — Telephone Encounter (Signed)
-----   Message from [name], MD sent at 03/08/2015 10:36 AM EDT ----- Reviewed report. Please let him know that chest x-ray did not show any evidence of heart failure or pleural effusions. He also had old left lower posterior lateral rib fractures documented - not sure if this is symptomatic or not. The report also mentions potentially an infiltrate in the lingula. In case this is evidence of early infection and in light of his symptoms, let's treat with a course of azithromycin 500 mg on the first day, and then 250 mg once a day for the next 4 days. He should follow-up with Dr. Anastasio Champion over the next few weeks to see if any additional evaluation as needed.

## 2015-03-15 ENCOUNTER — Telehealth (HOSPITAL_COMMUNITY): Payer: Self-pay | Admitting: *Deleted

## 2015-03-15 NOTE — Telephone Encounter (Signed)
Pt called stating he body started shaking and he don't know if it's anything that he takes is causing this. Per pt he's been feeling a little depressed lately and have been having mild headache above his left. On the depression rating he felt like a 4 today. Per pt he would like to come into office to see Dr. Harrington Challenger a little earlier then his already schedule appt? Informed pt that Dr. Harrington Challenger is not in the office today and he showed understanding. Pt is not feeling suicidal and was not able to take available appts that was opened. Offered for pt to go to the ER if he feels like it was an emergency and per pt he don't want to. Per pt he would rather have Dr. Harrington Challenger call him back [phone]

## 2015-03-20 NOTE — Telephone Encounter (Signed)
lmtcb

## 2015-04-03 ENCOUNTER — Ambulatory Visit (INDEPENDENT_AMBULATORY_CARE_PROVIDER_SITE_OTHER): Payer: Medicare Other | Admitting: Psychiatry

## 2015-04-03 ENCOUNTER — Encounter (HOSPITAL_COMMUNITY): Payer: Self-pay | Admitting: Psychiatry

## 2015-04-03 VITALS — BP 159/79 | HR 69 | Ht 74.0 in | Wt 210.4 lb

## 2015-04-03 DIAGNOSIS — F332 Major depressive disorder, recurrent severe without psychotic features: Secondary | ICD-10-CM

## 2015-04-03 MED ORDER — CLONAZEPAM 1 MG PO TABS: 1.0000 mg | ORAL_TABLET | Freq: Two times a day (BID) | ORAL | Status: DC

## 2015-04-03 MED ORDER — VENLAFAXINE HCL ER 150 MG PO CP24: 150.0000 mg | ORAL_CAPSULE | Freq: Two times a day (BID) | ORAL | Status: DC

## 2015-04-03 NOTE — Progress Notes (Signed)
Patient ID: Jared Bridges, male   DOB: Oct 10, 1930, 81 y.o.   MRN: [id number] Patient ID: Jared Bridges, male   DOB: 11/11/1930, 81 y.o.   MRN: [id number]  Psychiatric Assessment Adult  Patient Identification:  Jared Bridges Date of Evaluation:  04/03/2015 Chief Complaint: I've had recurrent bouts of depression History of Chief Complaint:   Chief Complaint  Patient presents with  . Depression  . Anxiety  . Follow-up    Anxiety     this patient is an 81 year old married white male who lives with his wife and daughter in [place]. He is a retired Scientific laboratory technician. The patient was referred by his primary physician, Dr. [name], for further assessment treatment of depression.  The patient states that he's had depression on and off over the years. He has brother and father owns several businesses. When he was 81 years old his father's businesses got into financial trouble and he and his brother had to buy them out. This was very difficult for him financially and personally and he went through a severe depressive episode at that time. He has been on and off medications for many years and he doesn't recall the names. He had ECT done at [facility] 2 years ago but he does not feel like it was helpful. Most recently he seen a doctor at [facility] who has put him on Effexor and it does seem to be working. He got increasingly depressed recently because his wife broke her hip in early January and had to go into rehabilitation in a nursing home. He has always had difficulty sleeping and this seems to be worse recently as well.  The patient also has had numerous health problems. He's had peripheral neuropathy for unknown reasons. Lately his walking is gotten worse and he is using a cane. He's had numerous knee surgeries back surgery. His balance is not the best. He is upset because his daughters had to leave her job to help take care of himself and his wife. He denies being suicidal but has down periods at times. Currently  sitting on Effexor 225 mg a day and is gradually trying to get up to 300 mg daily. He is on Lunesta but it's not helping sleep. Valium sometimes helpful causes a hangover. He's never been suicidal or psychotic and does not use drugs or alcohol .  The patient returns after four-week's. He is doing fairly well. He is going to be having knee replacement on his left knee at the end of June and he is anticipating improvement in his ambulation. His mood and energy are improved. The Valium sometimes helps his anxiety but it seems to be causing headaches. His daughter uses clonazepam and he wonders if he can try this and I think this is reasonable  Review of Systems  Constitutional: Positive for activity change and appetite change.  Eyes: Negative.   Respiratory: Negative.   Cardiovascular: Negative.   Gastrointestinal: Negative.   Endocrine: Negative.   Genitourinary: Negative.   Musculoskeletal: Positive for joint swelling, arthralgias and gait problem.  Skin: Negative.   Allergic/Immunologic: Negative.   Neurological: Positive for tremors and headaches.  Hematological: Negative.   Psychiatric/Behavioral: Positive for sleep disturbance and dysphoric mood.   Physical Examnot done  Depressive Symptoms: depressed mood, anhedonia, insomnia, psychomotor retardation, fatigue, loss of energy/fatigue, weight loss,  (Hypo) Manic Symptoms:   Elevated Mood:  No Irritable Mood:  No Grandiosity:  No Distractibility:  No Labiality of Mood:  No Delusions:  No Hallucinations:  No Impulsivity:  No Sexually Inappropriate Behavior:  No Financial Extravagance:  No Flight of Ideas:  No  Anxiety Symptoms: Excessive Worry:  Yes Panic Symptoms:  No Agoraphobia:  No Obsessive Compulsive: No  Symptoms: None, Specific Phobias:  No Social Anxiety:  No  Psychotic Symptoms:  Hallucinations: No None Delusions:  No Paranoia:  No   Ideas of Reference:  No  PTSD Symptoms: Ever had a traumatic  exposure:  No Had a traumatic exposure in the last month:  No Re-experiencing: No None Hypervigilance:  No Hyperarousal: No None Avoidance: No None  Traumatic Brain Injury: Yes Sports Related  Past Psychiatric History: Diagnosis: Major depression   Hospitalizations: none  Outpatient Care: Primarily at [facility]   Substance Abuse Care: none  Self-Mutilation:none  Suicidal Attempts: none  Violent Behaviors: none   Past Medical History:   Past Medical History  Diagnosis Date  . Chronic headaches   . Type 2 diabetes mellitus   . Essential hypertension   . Colon cancer   . Diverticulitis   . Syncope   . Hypercholesteremia   . GI bleed     Dr. Laural Golden - 1998  . Peptic ulcer disease   . Anemia, iron deficiency   . Depression   . Chronic kidney disease   . Peripheral neuropathy    History of Loss of Consciousness:  Yes Seizure History:  No Cardiac History:  Yes Allergies:   Allergies  Allergen Reactions  . Cymbalta [Duloxetine Hcl] Other (See Comments)    Makes patient have jerking motions.  . Iodine Hives    very allergic(per daughter)  . Lisinopril Other (See Comments)    Makes patient have jerking motions.  . Neurontin [Gabapentin] Other (See Comments)    Makes patient have jerking motions.  [name] Kitchen Zoloft [Sertraline Hcl] Other (See Comments)    Makes patient have jerking motions.   Current Medications:  Current Outpatient Prescriptions  Medication Sig Dispense Refill  . acetaminophen (TYLENOL) 500 MG tablet Take 1,000 mg by mouth every 6 (six) hours as needed for moderate pain.    . cholecalciferol (VITAMIN D) 400 UNITS TABS tablet Take 2,000 Units by mouth daily.    . diclofenac sodium (VOLTAREN) 1 % GEL Apply 2 g topically daily.    . feeding supplement, GLUCERNA SHAKE, (GLUCERNA SHAKE) LIQD Take 237 mLs by mouth 2 (two) times daily between meals.  0  . glimepiride (AMARYL) 1 MG tablet Take 1 mg by mouth daily as needed.     [name] Kitchen HYDROcodone-acetaminophen (NORCO/VICODIN)  5-325 MG per tablet Take 1 tablet by mouth every 8 (eight) hours as needed for moderate pain. (Patient taking differently: Take 0.5-1 tablets by mouth every 6 (six) hours as needed (MIGRAINE). ) 30 tablet 0  . ibuprofen (ADVIL,MOTRIN) 200 MG tablet Take 400 mg by mouth every 6 (six) hours as needed.    [name] Kitchen LYRICA 50 MG capsule Take 1 capsule by mouth at bedtime.   1  . pantoprazole (PROTONIX) 40 MG tablet Take 1 tablet (40 mg total) by mouth daily. 30 tablet 1  . predniSONE (DELTASONE) 5 MG tablet Take 5 mg by mouth daily.   1  . venlafaxine XR (EFFEXOR XR) 150 MG 24 hr capsule Take 1 capsule (150 mg total) by mouth 2 (two) times daily. 60 capsule 2  . clonazePAM (KLONOPIN) 1 MG tablet Take 1 tablet (1 mg total) by mouth 2 (two) times daily. 60 tablet 2   No current facility-administered medications for this visit.  Previous Psychotropic Medications:  Medication Dose                         Substance Abuse History in the last 12 months: Substance Age of 1st Use Last Use Amount Specific Type  Nicotine      Alcohol      Cannabis      Opiates      Cocaine      Methamphetamines      LSD      Ecstasy      Benzodiazepines      Caffeine      Inhalants      Others:                          Medical Consequences of Substance Abuse: none  Legal Consequences of Substance Abuse: none  Family Consequences of Substance Abuse: none Blackouts:  No DT's:  No Withdrawal Symptoms:  No None  Social History: Current Place of Residence: [place] of Birth: [place]  Family Members: Wife and daughter Marital Status:  Married Children:   Sons:   Daughters: 1 Relationships: Education:  Soil scientist Problems/Performance:  Religious Beliefs/Practices: Christian History of Abuse: none Pensions consultant; Land Economist History:  Tax inspector History:  Hobbies/Interests: Woodworking   Family History:   Family History  Problem  Relation Age of Onset  . Hypertension    . Depression Brother   . Depression Daughter     Mental Status Examination/Evaluation: Objective:  Appearance: Casual, Neat and Well Groomed  Eye Contact::  Good  Speech:  Clear and Coherent  Volume:  Normal  Mood: Good   Affect:  Appropriate  Thought Process:  Circumstantial and Goal Directed  Orientation:  Full (Time, Place, and Person)  Thought Content:  Rumination  Suicidal Thoughts:  No  Homicidal Thoughts:  No  Judgement:  Good  Insight:  Good  Psychomotor Activity:  Decreased  Akathisia:  No  Handed:  Right  AIMS (if indicated):    Assets:  Communication Skills Desire for Improvement Resilience Social Support Talents/Skills Vocational/Educational    Laboratory/X-Ray Psychological Evaluation(s)   Blood sugar moderately elevated on last reading      Assessment:  Axis I: Major Depression, Recurrent severe  AXIS I Major Depression, Recurrent severe  AXIS II Deferred  AXIS III Past Medical History  Diagnosis Date  . Chronic headaches   . Type 2 diabetes mellitus   . Essential hypertension   . Colon cancer   . Diverticulitis   . Syncope   . Hypercholesteremia   . GI bleed     Dr. Laural Golden - 1998  . Peptic ulcer disease   . Anemia, iron deficiency   . Depression   . Chronic kidney disease   . Peripheral neuropathy      AXIS IV other psychosocial or environmental problems  AXIS V 51-60 moderate symptoms   Treatment Plan/Recommendations:  Plan of Care: Medication management   Laboratory:   Psychotherapy: He is not sure about this at this time   Medications he will continue Effexor XR to 150 mg twice a day. He will discontinue Valium and start clonazepam 1 mg twice a day   Routine PRN Medications:  No  Consultations:   Safety Concerns:  He denies thoughts of harming self or others   Other:  He'll return in 2 months     Alford Gamero, Neoma Laming, MD  5/3/20161:44 PM

## 2015-04-17 ENCOUNTER — Other Ambulatory Visit (HOSPITAL_COMMUNITY): Payer: Self-pay | Admitting: Internal Medicine

## 2015-04-17 DIAGNOSIS — I712 Thoracic aortic aneurysm, without rupture, unspecified: Secondary | ICD-10-CM

## 2015-04-20 ENCOUNTER — Other Ambulatory Visit (HOSPITAL_COMMUNITY): Payer: Self-pay

## 2015-04-25 ENCOUNTER — Ambulatory Visit (HOSPITAL_COMMUNITY)
Admission: RE | Admit: 2015-04-25 | Discharge: 2015-04-25 | Disposition: A | Discharge status: Home / Self Care | Payer: Medicare Other | Source: Ambulatory Visit | Attending: Internal Medicine | Admitting: Internal Medicine

## 2015-04-25 DIAGNOSIS — I712 Thoracic aortic aneurysm, without rupture, unspecified: Secondary | ICD-10-CM

## 2015-04-25 DIAGNOSIS — R918 Other nonspecific abnormal finding of lung field: Secondary | ICD-10-CM | POA: Insufficient documentation

## 2015-04-25 DIAGNOSIS — I77810 Thoracic aortic ectasia: Secondary | ICD-10-CM | POA: Insufficient documentation

## 2015-04-25 DIAGNOSIS — I700 Atherosclerosis of aorta: Secondary | ICD-10-CM | POA: Insufficient documentation

## 2015-04-25 DIAGNOSIS — I2510 Atherosclerotic heart disease of native coronary artery without angina pectoris: Secondary | ICD-10-CM | POA: Insufficient documentation

## 2015-05-04 ENCOUNTER — Ambulatory Visit: Payer: Self-pay | Admitting: Orthopedic Surgery

## 2015-05-04 NOTE — Progress Notes (Signed)
Preoperative surgical orders have been place into the Epic hospital system for [name] on 05/04/2015, 12:48 PM  by Mickel Crow for surgery on 05-28-2015.  Preop Total Knee orders including Experal, IV Tylenol, and IV Decadron as long as there are no contraindications to the above medications. Arlee Muslim, PA-C

## 2015-05-17 NOTE — Patient Instructions (Addendum)
JAVAUN DIMPERIO  05/17/2015   Your procedure is scheduled on: Monday 05/28/2015  Report to [facility] Main  Entrance and follow signs to               [place] at  [place] (3RD) FLOOR!   Call this number if you have problems the morning of surgery [phone]   Remember: ONLY 1 PERSON MAY GO WITH YOU TO SHORT STAY TO GET  READY MORNING OF YOUR SURGERY.  Do not eat food or drink liquids :After Midnight.     Take these medicines the morning of surgery with A SIP OF WATER: VENLAFAXINE (EFFEXOR), PREDNISONE, PANTOPRAZOLE (PROTONIX)              TAKE NO DIABETIC MEDICATIONS MORNING OF SURGERY!                  You may not have any metal on your body including hair pins and              piercings  Do not wear jewelry, make-up, lotions, powders or perfumes, deodorant             Do not wear nail polish.  Do not shave  48 hours prior to surgery.              Men may shave face and neck.   Do not bring valuables to the hospital. [place].  Contacts, dentures or bridgework may not be worn into surgery.  Leave suitcase in the car. After surgery it may be brought to your room.     Patients discharged the day of surgery will not be allowed to drive home.  Name and phone number of your driver:  Special Instructions: N/A              Please read over the following fact sheets you were given: _____________________________________________________________________             [facility] - Preparing for Surgery Before surgery, you can play an important role.  Because skin is not sterile, your skin needs to be as free of germs as possible.  You can reduce the number of germs on your skin by washing with CHG (chlorahexidine gluconate) soap before surgery.  CHG is an antiseptic cleaner which kills germs and bonds with the skin to continue killing germs even after  washing. Please DO NOT use if you have an allergy to CHG or antibacterial soaps.  If your skin becomes reddened/irritated stop using the CHG and inform your nurse when you arrive at Short Stay. Do not shave (including legs and underarms) for at least 48 hours prior to the first CHG shower.  You may shave your face/neck. Please follow these instructions carefully:  1.  Shower with CHG Soap the night before surgery and the  morning of Surgery.  2.  If you choose to wash your hair, wash your hair first as usual with your  normal  shampoo.  3.  After you shampoo, rinse your hair and body thoroughly to remove the  shampoo.                           4.  Use CHG as  you would any other liquid soap.  You can apply chg directly  to the skin and wash                       Gently with a scrungie or clean washcloth.  5.  Apply the CHG Soap to your body ONLY FROM THE NECK DOWN.   Do not use on face/ open                           Wound or open sores. Avoid contact with eyes, ears mouth and genitals (private parts).                       Wash face,  Genitals (private parts) with your normal soap.             6.  Wash thoroughly, paying special attention to the area where your surgery  will be performed.  7.  Thoroughly rinse your body with warm water from the neck down.  8.  DO NOT shower/wash with your normal soap after using and rinsing off  the CHG Soap.                9.  Pat yourself dry with a clean towel.            10.  Wear clean pajamas.            11.  Place clean sheets on your bed the night of your first shower and do not  sleep with pets. Day of Surgery : Do not apply any lotions/deodorants the morning of surgery.  Please wear clean clothes to the hospital/surgery center.  FAILURE TO FOLLOW THESE INSTRUCTIONS MAY RESULT IN THE CANCELLATION OF YOUR SURGERY PATIENT SIGNATURE_________________________________  NURSE  SIGNATURE__________________________________  ________________________________________________________________________   Adam Phenix  An incentive spirometer is a tool that can help keep your lungs clear and active. This tool measures how well you are filling your lungs with each breath. Taking long deep breaths may help reverse or decrease the chance of developing breathing (pulmonary) problems (especially infection) following:  A long period of time when you are unable to move or be active. BEFORE THE PROCEDURE   If the spirometer includes an indicator to show your best effort, your nurse or respiratory therapist will set it to a desired goal.  If possible, sit up straight or lean slightly forward. Try not to slouch.  Hold the incentive spirometer in an upright position. INSTRUCTIONS FOR USE   Sit on the edge of your bed if possible, or sit up as far as you can in bed or on a chair.  Hold the incentive spirometer in an upright position.  Breathe out normally.  Place the mouthpiece in your mouth and seal your lips tightly around it.  Breathe in slowly and as deeply as possible, raising the piston or the ball toward the top of the column.  Hold your breath for 3-5 seconds or for as long as possible. Allow the piston or ball to fall to the bottom of the column.  Remove the mouthpiece from your mouth and breathe out normally.  Rest for a few seconds and repeat Steps 1 through 7 at least 10 times every 1-2 hours when you are awake. Take your time and take a few normal breaths between deep breaths.  The spirometer may include an indicator to show your best effort. Use the  indicator as a goal to work toward during each repetition.  After each set of 10 deep breaths, practice coughing to be sure your lungs are clear. If you have an incision (the cut made at the time of surgery), support your incision when coughing by placing a pillow or rolled up towels firmly against it. Once  you are able to get out of bed, walk around indoors and cough well. You may stop using the incentive spirometer when instructed by your caregiver.  RISKS AND COMPLICATIONS  Take your time so you do not get dizzy or light-headed.  If you are in pain, you may need to take or ask for pain medication before doing incentive spirometry. It is harder to take a deep breath if you are having pain. AFTER USE  Rest and breathe slowly and easily.  It can be helpful to keep track of a log of your progress. Your caregiver can provide you with a simple table to help with this. If you are using the spirometer at home, follow these instructions: [place] IF:   You are having difficultly using the spirometer.  You have trouble using the spirometer as often as instructed.  Your pain medication is not giving enough relief while using the spirometer.  You develop fever of 100.5 F (38.1 C) or higher. SEEK IMMEDIATE MEDICAL CARE IF:   You cough up bloody sputum that had not been present before.  You develop fever of 102 F (38.9 C) or greater.  You develop worsening pain at or near the incision site. MAKE SURE YOU:   Understand these instructions.  Will watch your condition.  Will get help right away if you are not doing well or get worse. Document Released: 03/30/2007 Document Revised: 02/09/2012 Document Reviewed: 05/31/2007 ExitCare Patient Information 2014 Exit[employer], [place].   ________________________________________________________________________  WHAT IS A BLOOD TRANSFUSION? Blood Transfusion Information  A transfusion is the replacement of blood or some of its parts. Blood is made up of multiple cells which provide different functions.  Red blood cells carry oxygen and are used for blood loss replacement.  White blood cells fight against infection.  Platelets control bleeding.  Plasma helps clot blood.  Other blood products are available for specialized needs, such as  hemophilia or other clotting disorders. BEFORE THE TRANSFUSION  Who gives blood for transfusions?   Healthy volunteers who are fully evaluated to make sure their blood is safe. This is blood bank blood. Transfusion therapy is the safest it has ever been in the practice of medicine. Before blood is taken from a donor, a complete history is taken to make sure that person has no history of diseases nor engages in risky social behavior (examples are intravenous drug use or sexual activity with multiple partners). The donor's travel history is screened to minimize risk of transmitting infections, such as malaria. The donated blood is tested for signs of infectious diseases, such as HIV and hepatitis. The blood is then tested to be sure it is compatible with you in order to minimize the chance of a transfusion reaction. If you or a relative donates blood, this is often done in anticipation of surgery and is not appropriate for emergency situations. It takes many days to process the donated blood. RISKS AND COMPLICATIONS Although transfusion therapy is very safe and saves many lives, the main dangers of transfusion include:   Getting an infectious disease.  Developing a transfusion reaction. This is an allergic reaction to something in the blood you  were given. Every precaution is taken to prevent this. The decision to have a blood transfusion has been considered carefully by your caregiver before blood is given. Blood is not given unless the benefits outweigh the risks. AFTER THE TRANSFUSION  Right after receiving a blood transfusion, you will usually feel much better and more energetic. This is especially true if your red blood cells have gotten low (anemic). The transfusion raises the level of the red blood cells which carry oxygen, and this usually causes an energy increase.  The nurse administering the transfusion will monitor you carefully for complications. HOME CARE INSTRUCTIONS  No special  instructions are needed after a transfusion. You may find your energy is better. Speak with your caregiver about any limitations on activity for underlying diseases you may have. SEEK MEDICAL CARE IF:   Your condition is not improving after your transfusion.  You develop redness or irritation at the intravenous (IV) site. SEEK IMMEDIATE MEDICAL CARE IF:  Any of the following symptoms occur over the next 12 hours:  Shaking chills.  You have a temperature by mouth above 102 F (38.9 C), not controlled by medicine.  Chest, back, or muscle pain.  People around you feel you are not acting correctly or are confused.  Shortness of breath or difficulty breathing.  Dizziness and fainting.  You get a rash or develop hives.  You have a decrease in urine output.  Your urine turns a dark color or changes to pink, red, or brown. Any of the following symptoms occur over the next 10 days:  You have a temperature by mouth above 102 F (38.9 C), not controlled by medicine.  Shortness of breath.  Weakness after normal activity.  The white part of the eye turns yellow (jaundice).  You have a decrease in the amount of urine or are urinating less often.  Your urine turns a dark color or changes to pink, red, or brown. Document Released: 11/14/2000 Document Revised: 02/09/2012 Document Reviewed: 07/03/2008 [facility] Patient Information 2014 [place], [place].  _______________________________________________________________________

## 2015-05-21 ENCOUNTER — Encounter (HOSPITAL_COMMUNITY)
Admission: RE | Admit: 2015-05-21 | Discharge: 2015-05-21 | Disposition: A | Discharge status: Home / Self Care | Payer: Medicare Other | Source: Ambulatory Visit | Attending: Orthopedic Surgery | Admitting: Orthopedic Surgery

## 2015-05-21 ENCOUNTER — Encounter (HOSPITAL_COMMUNITY): Payer: Self-pay

## 2015-05-21 DIAGNOSIS — Z01812 Encounter for preprocedural laboratory examination: Secondary | ICD-10-CM | POA: Diagnosis not present

## 2015-05-21 DIAGNOSIS — Z0183 Encounter for blood typing: Secondary | ICD-10-CM | POA: Diagnosis not present

## 2015-05-21 DIAGNOSIS — M179 Osteoarthritis of knee, unspecified: Secondary | ICD-10-CM | POA: Insufficient documentation

## 2015-05-21 LAB — URINALYSIS, ROUTINE W REFLEX MICROSCOPIC
Bilirubin Urine: NEGATIVE
Glucose, UA: NEGATIVE mg/dL
KETONES UR: NEGATIVE mg/dL
LEUKOCYTES UA: NEGATIVE
NITRITE: NEGATIVE
PROTEIN: NEGATIVE mg/dL
Specific Gravity, Urine: 1.028 (ref 1.005–1.030)
UROBILINOGEN UA: 1 mg/dL (ref 0.0–1.0)
pH: 6 (ref 5.0–8.0)

## 2015-05-21 LAB — COMPREHENSIVE METABOLIC PANEL
ALBUMIN: 4.1 g/dL (ref 3.5–5.0)
ALT: 26 U/L (ref 17–63)
AST: 26 U/L (ref 15–41)
Alkaline Phosphatase: 74 U/L (ref 38–126)
Anion gap: 7 (ref 5–15)
BILIRUBIN TOTAL: 0.7 mg/dL (ref 0.3–1.2)
BUN: 28 mg/dL — ABNORMAL HIGH (ref 6–20)
CHLORIDE: 103 mmol/L (ref 101–111)
CO2: 30 mmol/L (ref 22–32)
CREATININE: 1.06 mg/dL (ref 0.61–1.24)
Calcium: 9.6 mg/dL (ref 8.9–10.3)
GFR calc Af Amer: >60 mL/min (ref >60)
Glucose, Bld: 171 mg/dL — ABNORMAL HIGH (ref 65–99)
POTASSIUM: 5.3 mmol/L — AB (ref 3.5–5.1)
Sodium: 140 mmol/L (ref 135–145)
Total Protein: 7.4 g/dL (ref 6.5–8.1)

## 2015-05-21 LAB — CBC
HCT: 44.8 % (ref 39.0–52.0)
Hemoglobin: 14.5 g/dL (ref 13.0–17.0)
MCH: 31.7 pg (ref 26.0–34.0)
MCHC: 32.4 g/dL (ref 30.0–36.0)
MCV: 98 fL (ref 78.0–100.0)
PLATELETS: 210 10*3/uL (ref 150–400)
RBC: 4.57 MIL/uL (ref 4.22–5.81)
RDW: 13.3 % (ref 11.5–15.5)
WBC: 13.1 10*3/uL — AB (ref 4.0–10.5)

## 2015-05-21 LAB — SURGICAL PCR SCREEN
MRSA, PCR: NEGATIVE
STAPHYLOCOCCUS AUREUS: NEGATIVE

## 2015-05-21 LAB — URINE MICROSCOPIC-ADD ON

## 2015-05-21 LAB — APTT: APTT: 30 s (ref 24–37)

## 2015-05-21 LAB — PROTIME-INR
INR: 1.03 (ref 0.00–1.49)
Prothrombin Time: 13.7 seconds (ref 11.6–15.2)

## 2015-05-21 LAB — ABO/RH: ABO/RH(D): O POS

## 2015-05-21 NOTE — Progress Notes (Signed)
   05/21/15 1100  OBSTRUCTIVE SLEEP APNEA  Have you ever been diagnosed with sleep apnea through a sleep study? No  Do you snore loudly (loud enough to be heard through closed doors)?  0  Do you often feel tired, fatigued, or sleepy during the daytime? 0  Has anyone observed you stop breathing during your sleep? 0  Do you have, or are you being treated for high blood pressure? 1  BMI more than 35 kg/m2? 0  Age over 79 years old? 1  Neck circumference greater than 40 cm/16 inches? 1  Gender: 1

## 2015-05-22 ENCOUNTER — Ambulatory Visit (INDEPENDENT_AMBULATORY_CARE_PROVIDER_SITE_OTHER): Payer: Medicare Other | Admitting: Neurology

## 2015-05-22 ENCOUNTER — Encounter: Payer: Self-pay | Admitting: Neurology

## 2015-05-22 VITALS — BP 150/72 | HR 80 | Resp 20 | Ht 71.65 in | Wt 209.0 lb

## 2015-05-22 DIAGNOSIS — G43111 Migraine with aura, intractable, with status migrainosus: Secondary | ICD-10-CM | POA: Diagnosis not present

## 2015-05-22 DIAGNOSIS — G4730 Sleep apnea, unspecified: Secondary | ICD-10-CM

## 2015-05-22 DIAGNOSIS — G903 Multi-system degeneration of the autonomic nervous system: Secondary | ICD-10-CM | POA: Diagnosis not present

## 2015-05-22 DIAGNOSIS — G44019 Episodic cluster headache, not intractable: Secondary | ICD-10-CM | POA: Diagnosis not present

## 2015-05-22 DIAGNOSIS — J9600 Acute respiratory failure, unspecified whether with hypoxia or hypercapnia: Secondary | ICD-10-CM

## 2015-05-22 DIAGNOSIS — M353 Polymyalgia rheumatica: Secondary | ICD-10-CM | POA: Diagnosis not present

## 2015-05-22 DIAGNOSIS — I951 Orthostatic hypotension: Secondary | ICD-10-CM

## 2015-05-22 DIAGNOSIS — G609 Hereditary and idiopathic neuropathy, unspecified: Secondary | ICD-10-CM

## 2015-05-22 DIAGNOSIS — G4700 Insomnia, unspecified: Secondary | ICD-10-CM | POA: Diagnosis not present

## 2015-05-22 MED ORDER — MELATONIN 3 MG PO TABS: ORAL_TABLET | ORAL | Status: DC

## 2015-05-22 MED ORDER — DIVALPROEX SODIUM 250 MG PO DR TAB: 250.0000 mg | DELAYED_RELEASE_TABLET | Freq: Every evening | ORAL | Status: DC

## 2015-05-22 NOTE — Progress Notes (Addendum)
SLEEP MEDICINE CLINIC   Provider:  Larey Seat, M D  Referring Provider: Doree Albee, MD Primary Care Physician:  Doree Albee, MD  Chief Complaint  Patient presents with  . Migraine    rm 11, follow up, alone    HPI:  Jared Bridges is a 81 y.o. male , seen here as a referral from Dr. 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   Dr. Hurshel Party is the referring physician. Jared Bridges is using a cane in his left hand to walk and he has noticeable antebrachial petechiae. He reports that he attended [employer] and is a former Psychologist, educational. He is also followed by Dr. Hector Shade,  his orthopedic surgeon. The patient carries a diagnosis of gastroesophageal reflux disease, vitamin D deficiency, non-insulin-dependent diabetes, arthritis and carpal tunnel. He had been tried on verapamil 40 mg for the treatment of migraines, prophylactically but had to discontinue this medication on 04-23-15 due to side effects, he cannot recall. Dr. Anastasio Champion also stated that the patient had some insomnia problems and a history of depression and polymyalgia rheumatica. He is on maintenance prednisone and he was tried on melatonin to 1 mg caps for sleep but had to discontinue those due to side effects as well. The patient has chronic intermittent migraine headaches interspersed with spells of cluster headaches. He has a perception of flashing lights receiving the headache onset again with a little point of light dysesthesia and then grow and occupy his full visual field. This process takes about 30 minutes. He usually has to take pain medication at this time to not suffer from a full-blown migraine. He sees fortification lines that he describes as edges of broken glass, zigzag". He has had these driving , when looking into the sun, and he describes photophobia  and nausea. He has once nearly fainted with a migraine, while he was driving to a dentist appointment. He ended up being seen in an ED, had a CT scan negative and was given aspirin. He is not sure about the date of this event . He reports no hemiparetic or hemisensory loss. He has also another type of pain that he describes as not a migraine but a real nuisance. He states that these headaches haven't woken him out of sleep in the early morning hours the headaches usually occurred around 4 or 5 AM. reports that a tender spot arises on the left temple" temporal artery. This seems to be his trigger point and only on the left. Describes the pain just as annoying but not stabbing, burning or pulling. There is no specific intensity to the pain and the sore pain will last into the next day/ days.  The patient reports a delirium during a urinary infection or urosepsis, about a year ago. He became also static. He had near fainting episodes. He has felt a little unsteady ever since. In the darkness when he cannot visually control his movements he is very unsteady and he has a sinking feeling, a high risk of falling.  He was found to have an aortic aneurysm of the thoracic aorta back in 2008 this is now followed by Dr. Anastasio Champion as well. He denies any chest pains, dyspnea or palpitations.   Review of Systems: Out of a complete 14 system review, the patient complains of only the following symptoms, and all other reviewed systems  are negative. Dizziness, orthostatic, neuropathy with dysautonomia. Depression, anxiety, dizziness, headaches, frequent insomnia, very easy bruising, joint pain muscle cramps hearing loss and a cardiac murmur.  Epworth score 8 , Fatigue severity score 41  ,  PHQ 9 depression score deferred. GDS was 3 points.    History   Social History  . Marital Status: Married    Spouse Name: N/A  . Number of Children: N/A  . Years of Education: N/A   Occupational History  . Not on file.    Social History Main Topics  . Smoking status: Former Smoker -- 0.10 packs/day    Types: Cigarettes    Start date: 12/18/1958    Quit date: 12/01/1988  . Smokeless tobacco: Never Used  . Alcohol Use: No  . Drug Use: No  . Sexual Activity: No   Other Topics Concern  . Not on file   Social History Narrative    Family History  Problem Relation Age of Onset  . Hypertension    . Depression Brother   . Depression Daughter     Past Medical History  Diagnosis Date  . Chronic headaches   . Type 2 diabetes mellitus   . Essential hypertension   . Diverticulitis   . Syncope   . Hypercholesteremia   . GI bleed     Dr. Laural Golden - 1998  . Peptic ulcer disease   . Anemia, iron deficiency   . Depression   . Chronic kidney disease   . Peripheral neuropathy     Past Surgical History  Procedure Laterality Date  . Uvulectomy  2004  . Spinal fusion    . Spinal fusion    . Colon surgery      FOR DIVERTICULOSIS  . Tonsillectomy    . Eye surgery      right cataract with lens implant  . Appendectomy      as child  . Hernia repair      1-right inguinal, 3- left inguinal    Current Outpatient Prescriptions  Medication Sig Dispense Refill  . acetaminophen (TYLENOL) 500 MG tablet Take 1,000 mg by mouth every 6 (six) hours as needed for moderate pain.    . Cholecalciferol (VITAMIN D) 2000 UNITS CAPS Take 1 capsule by mouth every evening.    . clonazePAM (KLONOPIN) 1 MG tablet Take 1 tablet (1 mg total) by mouth 2 (two) times daily. (Patient taking differently: Take 1 mg by mouth at bedtime. ) 60 tablet 2  . diazepam (VALIUM) 5 MG tablet Take 5 mg by mouth every 6 (six) hours as needed for anxiety.    . diclofenac sodium (VOLTAREN) 1 % GEL Apply 2 g topically daily.    . feeding supplement, GLUCERNA SHAKE, (GLUCERNA SHAKE) LIQD Take 237 mLs by mouth 2 (two) times daily between meals.  0  . glimepiride (AMARYL) 1 MG tablet Take 1 mg by mouth daily as needed (if blood sugar is above  150).     [name] Kitchen HYDROcodone-acetaminophen (NORCO/VICODIN) 5-325 MG per tablet Take 1 tablet by mouth every 8 (eight) hours as needed for moderate pain. (Patient taking differently: Take 0.5-1 tablets by mouth every 6 (six) hours as needed (MIGRAINE). ) 30 tablet 0  . ibuprofen (ADVIL,MOTRIN) 200 MG tablet Take 400 mg by mouth every 8 (eight) hours as needed for moderate pain.     [name] Kitchen LYRICA 50 MG capsule Take 1 capsule by mouth at bedtime.   1  . pantoprazole (PROTONIX) 40 MG tablet Take 1 tablet (40  mg total) by mouth daily. 30 tablet 1  . predniSONE (DELTASONE) 5 MG tablet Take 5 mg by mouth daily.   1  . venlafaxine (EFFEXOR) 37.5 MG tablet Take 112.5 mg by mouth at bedtime.    [name] Kitchen venlafaxine XR (EFFEXOR XR) 150 MG 24 hr capsule Take 1 capsule (150 mg total) by mouth 2 (two) times daily. 60 capsule 2  . venlafaxine XR (EFFEXOR-XR) 75 MG 24 hr capsule Take 75 mg by mouth daily with breakfast.    . divalproex (DEPAKOTE) 250 MG DR tablet Take 1 tablet (250 mg total) by mouth Nightly. 30 tablet 5  . Melatonin 3 MG TABS Date melatonin 1 hour before intended bedtime with fluids. Melatonin usually works best if taken after you eliminated  light sources. 30 tablet 0   No current facility-administered medications for this visit.    Allergies as of 05/22/2015 - Review Complete 05/22/2015  Allergen Reaction Noted  . Iodinated diagnostic agents  05/17/2015  . Cymbalta [duloxetine hcl] Other (See Comments) 08/16/2014  . Iodine Hives 08/16/2014  . Lisinopril Other (See Comments) 08/16/2014  . Neurontin [gabapentin] Other (See Comments) 08/16/2014  . Zoloft [sertraline hcl] Other (See Comments) and Diarrhea 08/16/2014    Vitals: BP 150/72 mmHg  Pulse 80  Resp 20  Ht 5' 11.65" (1.82 m)  Wt 209 lb (94.802 kg)  BMI 28.62 kg/m2 Last Weight:  Wt Readings from Last 1 Encounters:  05/22/15 209 lb (94.802 kg)       Last Height:   Ht Readings from Last 1 Encounters:  05/22/15 5' 11.65" (1.82 m)     Physical exam:  General: The patient is awake, alert and appears not in acute distress. Head: Normocephalic, atraumatic.  Neck is supple. Mallampati 3   neck circumference: 18  Nasal airflow unrestricited , dysphonia, , TMJ is evident . Retrognathia is not seen.  Cardiovascular:  Regular rate and rhythm , with  murmurs , but not  carotid bruit, and without distended neck veins. Respiratory: Lungs are clear to auscultation. Skin:  Without evidence of edema, had bluish discoloured ankles and petechiea.  Trunk: BMI is  elevated and patient has normal posture.  Neurologic exam : The patient is awake and alert, oriented to place and time.   Memory subjective described as impaired .  There is a normal attention span & concentration ability. Speech is fluent without dysarthria, but there is dysphonia, not  aphasia. Mood and affect are appropriate.  Cranial nerves: Pupils are equal and briskly reactive to light. Funduscopic exam without  evidence of pallor or edema. Status post cataract - Extraocular movements  in vertical and horizontal planes intact and without nystagmus. Visual fields by finger perimetry are intact. Hearing to finger rub intact.  Facial sensation intact to fine touch. Facial motor strength  Left nasolabial fold is weaker. tongue and uvula move midline.  Motor exam:   Normal tone, muscle bulk and symmetric strength in all extremities.  Sensory:  Fine touch, pinprick and vibration were tested in all extremities, and are reduced in the feet. Vibration is lost  . Proprioception is tested in the upper extremities - normal.  Coordination: Rapid alternating movements in the fingers/hands is normal. Finger-to-nose maneuver normal without evidence of ataxia, dysmetria .  There is a mild , low amplitude resting and action tremor.  Gait and station: Patient walks with cane as assistive device but  is able  (unassisted!)  to climb up to the exam table.  Strength within normal  limits. Stance is stable and normal. Tandem gait is fragmented, had to be aborted . Romberg testing is positive,  retropulsive.  Deep tendon reflexes: in the upper and lower extremities are absent- not elicited.   Babinski maneuver response is  Downgoing/ equivocal .   Assessment:  After physical and neurologic examination, review of laboratory studies, imaging, neurophysiology testing and pre-existing records, assessment is   Mr. Sawaya very likely has an vascular form of headache, a subtype of migraine. It will be tricky to treat him though given his comorbidities and the medication regimen he has to be on to treat results. Spinal stenosis facet degenerative disease, and distal abdominal aorta with about 4.3 cm transverse dimension. He has orthostatic problems and he has dysautonomia. The dysautonomia as reflected in a sensory neuropathy and a tendency for retropulsion, falling backwards.  He is on an only 50 mg of Lyrica at night after failing Neurontin and higher doses. I think it may be worthwhile for him to go to Lyrica 100 mg, I also would not discontinue the Effexor which is at for treatment of depression or the pantoprazole for GERD. He is on a low-dose of prednisone to maintain for polymyalgia rheumatica. Likely this is constant causing his petechiae. Reports not to snore and he does not think he has apnea but he has several risk factors for it. His insomnia was last tested in a polysomnography at [facility] probably a decade ago.he has severe headches in sleep and rigt after, he has vascular and pulmonary disease.he is at high risk for C02 retention.    The patient was advised of the nature of the diagnosed sleep disorder , the treatment options and risks for general a health and wellness arising from not treating the condition. Visit duration was minutes.   Plan:  Treatment plan and additional workup : SPLIT with CO2, Please start oxygen if the patient does not qualify for CPAP/BiPAP but he is  hypoxemic. melatonin 5 mg at night. He could use depakote for migraine prophylaxis. Need LFT before starting this medication . RLS- versus neuropathy. voltaren cream heps.       Asencion Partridge Anguel Delapena MD  05/22/2015

## 2015-05-22 NOTE — Addendum Note (Signed)
Addended by: Larey Seat on: 05/22/2015 03:58 PM   Modules accepted: Orders

## 2015-05-27 NOTE — H&P (Signed)
TOTAL KNEE ADMISSION H&P  Patient is being admitted for left total knee arthroplasty.  Subjective:  Chief Complaint:left knee pain.  HPI: Jared Bridges, 81 y.o. male, has a history of pain and functional disability in the left knee due to trauma and arthritis and has failed non-surgical conservative treatments for greater than 12 weeks to includeNSAID's and/or analgesics, corticosteriod injections and activity modification.  Onset of symptoms was gradual, starting 2 years ago with gradually worsening course since that time. The patient noted prior procedures on the knee to include  arthroscopy and menisectomy on the left knee(s).  Patient currently rates pain in the left knee(s) at 7 out of 10 with activity. Patient has night pain, worsening of pain with activity and weight bearing, pain that interferes with activities of daily living, pain with passive range of motion, crepitus and joint swelling.  Patient has evidence of periarticular osteophytes and joint space narrowing by imaging studies.  There is no active infection.  Patient Active Problem List   Diagnosis Date Noted  . BRBPR (bright red blood per rectum) 11/21/2014  . Orthostatic hypotension 11/21/2014  . Dizziness 11/16/2014  . Anemia, iron deficiency 08/16/2014  . DJD (degenerative joint disease) 08/16/2014  . Essential hypertension   . Type 2 diabetes mellitus    Past Medical History  Diagnosis Date  . Chronic headaches   . Type 2 diabetes mellitus   . Essential hypertension   . Diverticulitis   . Syncope   . Hypercholesteremia   . GI bleed     Dr. Laural Golden - 1998  . Peptic ulcer disease   . Anemia, iron deficiency   . Depression   . Chronic kidney disease   . Peripheral neuropathy     Past Surgical History  Procedure Laterality Date  . Uvulectomy  2004  . Spinal fusion    . Spinal fusion    . Colon surgery      FOR DIVERTICULOSIS  . Tonsillectomy    . Eye surgery      right cataract with lens implant  .  Appendectomy      as child  . Hernia repair      1-right inguinal, 3- left inguinal     Current outpatient prescriptions:  .  acetaminophen (TYLENOL) 500 MG tablet, Take 1,000 mg by mouth every 6 (six) hours as needed for moderate pain., Disp: , Rfl:  .  Cholecalciferol (VITAMIN D) 2000 UNITS CAPS, Take 1 capsule by mouth every evening., Disp: , Rfl:  .  clonazePAM (KLONOPIN) 1 MG tablet, Take 1 tablet (1 mg total) by mouth 2 (two) times daily. (Patient taking differently: Take 1 mg by mouth at bedtime. ), Disp: 60 tablet, Rfl: 2 .  diazepam (VALIUM) 5 MG tablet, Take 5 mg by mouth every 6 (six) hours as needed for anxiety., Disp: , Rfl:  .  diclofenac sodium (VOLTAREN) 1 % GEL, Apply 2 g topically daily., Disp: , Rfl:  .  feeding supplement, GLUCERNA SHAKE, (GLUCERNA SHAKE) LIQD, Take 237 mLs by mouth 2 (two) times daily between meals., Disp: , Rfl: 0 .  glimepiride (AMARYL) 1 MG tablet, Take 1 mg by mouth daily as needed (if blood sugar is above 150). , Disp: , Rfl:  .  HYDROcodone-acetaminophen (NORCO/VICODIN) 5-325 MG per tablet, Take 1 tablet by mouth every 8 (eight) hours as needed for moderate pain. (Patient taking differently: Take 0.5-1 tablets by mouth every 6 (six) hours as needed (MIGRAINE). ), Disp: 30 tablet, Rfl: 0 .  ibuprofen (ADVIL,MOTRIN) 200 MG tablet, Take 400 mg by mouth every 8 (eight) hours as needed for moderate pain. , Disp: , Rfl:  .  LYRICA 50 MG capsule, Take 1 capsule by mouth at bedtime. , Disp: , Rfl: 1 .  pantoprazole (PROTONIX) 40 MG tablet, Take 1 tablet (40 mg total) by mouth daily., Disp: 30 tablet, Rfl: 1 .  predniSONE (DELTASONE) 5 MG tablet, Take 5 mg by mouth daily. , Disp: , Rfl: 1 .  venlafaxine (EFFEXOR) 37.5 MG tablet, Take 112.5 mg by mouth at bedtime., Disp: , Rfl:  .  venlafaxine XR (EFFEXOR-XR) 75 MG 24 hr capsule, Take 75 mg by mouth daily with breakfast., Disp: , Rfl:  .  divalproex (DEPAKOTE) 250 MG DR tablet, Take 1 tablet (250 mg total) by  mouth Nightly., Disp: 30 tablet, Rfl: 5 .  Melatonin 3 MG TABS, Date melatonin 1 hour before intended bedtime with fluids. Melatonin usually works best if taken after you eliminated  light sources., Disp: 30 tablet, Rfl: 0 .  venlafaxine XR (EFFEXOR XR) 150 MG 24 hr capsule, Take 1 capsule (150 mg total) by mouth 2 (two) times daily., Disp: 60 capsule, Rfl: 2  Allergies  Allergen Reactions  . Iodinated Diagnostic Agents     Other reaction(s): Other (See Comments) Other Reaction: iv contrast=hives  . Cymbalta [Duloxetine Hcl] Other (See Comments)    Makes patient have jerking motions.  . Iodine Hives    very allergic(per daughter)  . Lisinopril Other (See Comments)    Makes patient have jerking motions.  . Neurontin [Gabapentin] Other (See Comments)    Makes patient have jerking motions.  [name] Kitchen Zoloft [Sertraline Hcl] Other (See Comments) and Diarrhea    Makes patient have jerking motions.    History  Substance Use Topics  . Smoking status: Former Smoker -- 0.10 packs/day    Types: Cigarettes    Start date: 12/18/1958    Quit date: 12/01/1988  . Smokeless tobacco: Never Used  . Alcohol Use: No    Family History  Problem Relation Age of Onset  . Hypertension    . Depression Brother   . Depression Daughter      Review of Systems  Constitutional: Positive for diaphoresis. Negative for fever, chills, weight loss and malaise/fatigue.  HENT: Positive for hearing loss. Negative for congestion, ear discharge, ear pain, nosebleeds, sore throat and tinnitus.   Eyes: Negative.   Respiratory: Negative.  Negative for stridor.   Cardiovascular: Negative.   Gastrointestinal: Negative.   Genitourinary: Negative.   Musculoskeletal: Positive for back pain and joint pain. Negative for myalgias, falls and neck pain.       Left knee pain  Skin: Negative.   Neurological: Positive for tremors and headaches. Negative for dizziness, tingling, sensory change, speech change, focal weakness, seizures,  loss of consciousness and weakness.  Psychiatric/Behavioral: Negative.     Objective:  Physical Exam  Constitutional: He is oriented to person, place, and time. He appears well-developed and well-nourished. No distress.  HENT:  Head: Normocephalic and atraumatic.  Right Ear: External ear normal.  Left Ear: External ear normal.  Nose: Nose normal.  Mouth/Throat: Oropharynx is clear and moist.  Eyes: Conjunctivae and EOM are normal.  Neck: Normal range of motion. Neck supple.  Cardiovascular: Normal rate, regular rhythm and intact distal pulses.   Murmur heard.  Systolic murmur is present with a grade of 3/6  Respiratory: Effort normal and breath sounds normal. No respiratory distress. He has no wheezes.  GI: Soft. Bowel sounds are normal. He exhibits no distension. There is no tenderness.  Musculoskeletal:       Right hip: Normal.       Left hip: Normal.       Right knee: Normal.       Left knee: He exhibits decreased range of motion, swelling and effusion. He exhibits no erythema. Tenderness found. Medial joint line and lateral joint line tenderness noted.  Evaluation of his hips shows normal range of motion with no discomfort. His right knee shows no effusion. Range of motion of the right knee is 0 to 135. Slight crepitus on range of motion. No joint line tenderness or instability. Left knee shows trace effusion. Range of motion left knee is from 5 to 125 degrees. He does have significant tenderness on the medial joint line. There is no lateral joint line tenderness or instability noted. His gait pattern is significantly antalgic on the left.  Neurological: He is alert and oriented to person, place, and time. He has normal strength and normal reflexes. No sensory deficit.  Skin: No rash noted. He is not diaphoretic. No erythema.  Psychiatric: He has a normal mood and affect. His behavior is normal.    Vitals  Weight: 204 lb Height: 73in Body Surface Area: 2.17 m Body Mass  Index: 26.91 kg/m  Pulse: 72 (Regular)  BP: 130/78 (Sitting, Left Arm, Standard)  Imaging Review Plain radiographs demonstrate severe degenerative joint disease of the left knee(s). The overall alignment ismild varus. The bone quality appears to be good for age and reported activity level.  Assessment/Plan:  End stage primary osteoarthritis, left knee   The patient history, physical examination, clinical judgment of the provider and imaging studies are consistent with end stage degenerative joint disease of the left knee(s) and total knee arthroplasty is deemed medically necessary. The treatment options including medical management, injection therapy arthroscopy and arthroplasty were discussed at length. The risks and benefits of total knee arthroplasty were presented and reviewed. The risks due to aseptic loosening, infection, stiffness, patella tracking problems, thromboembolic complications and other imponderables were discussed. The patient acknowledged the explanation, agreed to proceed with the plan and consent was signed. Patient is being admitted for inpatient treatment for surgery, pain control, PT, OT, prophylactic antibiotics, VTE prophylaxis, progressive ambulation and ADL's and discharge planning. The patient is planning to be discharged home with home health services   PCP: Dr. Domenic Polite Cardio: Dr. Pauline Good  TXA IV   Ardeen Jourdain, PA-C

## 2015-05-28 ENCOUNTER — Inpatient Hospital Stay (HOSPITAL_COMMUNITY): Payer: Medicare Other | Admitting: Anesthesiology

## 2015-05-28 ENCOUNTER — Inpatient Hospital Stay (HOSPITAL_COMMUNITY)
Admission: RE | Admit: 2015-05-28 | Discharge: 2015-05-30 | DRG: 470 | Disposition: A | Discharge status: Home Health | Payer: Medicare Other | Source: Ambulatory Visit | Attending: Orthopedic Surgery | Admitting: Orthopedic Surgery

## 2015-05-28 ENCOUNTER — Encounter (HOSPITAL_COMMUNITY)
Admission: RE | Disposition: A | Discharge status: Home Health | Payer: Self-pay | Source: Ambulatory Visit | Attending: Orthopedic Surgery

## 2015-05-28 ENCOUNTER — Encounter (HOSPITAL_COMMUNITY): Payer: Self-pay | Admitting: *Deleted

## 2015-05-28 DIAGNOSIS — Z8249 Family history of ischemic heart disease and other diseases of the circulatory system: Secondary | ICD-10-CM | POA: Diagnosis not present

## 2015-05-28 DIAGNOSIS — M1712 Unilateral primary osteoarthritis, left knee: Secondary | ICD-10-CM | POA: Diagnosis present

## 2015-05-28 DIAGNOSIS — I129 Hypertensive chronic kidney disease with stage 1 through stage 4 chronic kidney disease, or unspecified chronic kidney disease: Secondary | ICD-10-CM | POA: Diagnosis present

## 2015-05-28 DIAGNOSIS — M25562 Pain in left knee: Secondary | ICD-10-CM | POA: Diagnosis present

## 2015-05-28 DIAGNOSIS — N189 Chronic kidney disease, unspecified: Secondary | ICD-10-CM | POA: Diagnosis present

## 2015-05-28 DIAGNOSIS — Z79899 Other long term (current) drug therapy: Secondary | ICD-10-CM | POA: Diagnosis not present

## 2015-05-28 DIAGNOSIS — Z01812 Encounter for preprocedural laboratory examination: Secondary | ICD-10-CM

## 2015-05-28 DIAGNOSIS — F329 Major depressive disorder, single episode, unspecified: Secondary | ICD-10-CM | POA: Diagnosis present

## 2015-05-28 DIAGNOSIS — Z981 Arthrodesis status: Secondary | ICD-10-CM | POA: Diagnosis not present

## 2015-05-28 DIAGNOSIS — E780 Pure hypercholesterolemia: Secondary | ICD-10-CM | POA: Diagnosis present

## 2015-05-28 DIAGNOSIS — Z8711 Personal history of peptic ulcer disease: Secondary | ICD-10-CM

## 2015-05-28 DIAGNOSIS — E119 Type 2 diabetes mellitus without complications: Secondary | ICD-10-CM | POA: Diagnosis present

## 2015-05-28 DIAGNOSIS — Z87891 Personal history of nicotine dependence: Secondary | ICD-10-CM | POA: Diagnosis not present

## 2015-05-28 DIAGNOSIS — M1710 Unilateral primary osteoarthritis, unspecified knee: Secondary | ICD-10-CM | POA: Diagnosis present

## 2015-05-28 DIAGNOSIS — M179 Osteoarthritis of knee, unspecified: Secondary | ICD-10-CM | POA: Diagnosis present

## 2015-05-28 HISTORY — PX: TOTAL KNEE ARTHROPLASTY: SHX125

## 2015-05-28 LAB — GLUCOSE, CAPILLARY
GLUCOSE-CAPILLARY: 258 mg/dL — AB (ref 65–99)
Glucose-Capillary: 131 mg/dL — ABNORMAL HIGH (ref 65–99)
Glucose-Capillary: 111 mg/dL — ABNORMAL HIGH (ref 65–99)
Glucose-Capillary: 245 mg/dL — ABNORMAL HIGH (ref 65–99)

## 2015-05-28 LAB — TYPE AND SCREEN
ABO/RH(D): O POS
ANTIBODY SCREEN: NEGATIVE

## 2015-05-28 SURGERY — ARTHROPLASTY, KNEE, TOTAL
Anesthesia: Monitor Anesthesia Care | Site: Knee | Laterality: Left

## 2015-05-28 MED ORDER — PANTOPRAZOLE SODIUM 40 MG PO TBEC
40.0000 mg | DELAYED_RELEASE_TABLET | Freq: Every day | ORAL | Status: DC
  Administered 2015-05-29 – 2015-05-30 (×2): 40 mg via ORAL
  Filled 2015-05-28 (×3): qty 1

## 2015-05-28 MED ORDER — MIDAZOLAM HCL 2 MG/2ML IJ SOLN
INTRAMUSCULAR | Status: AC
  Filled 2015-05-28: qty 2

## 2015-05-28 MED ORDER — METHOCARBAMOL 500 MG PO TABS
500.0000 mg | ORAL_TABLET | Freq: Four times a day (QID) | ORAL | Status: DC | PRN
  Administered 2015-05-29 – 2015-05-30 (×5): 500 mg via ORAL
  Filled 2015-05-28 (×5): qty 1

## 2015-05-28 MED ORDER — FLEET ENEMA 7-19 GM/118ML RE ENEM: 1.0000 | ENEMA | Freq: Once | RECTAL | Status: AC | PRN

## 2015-05-28 MED ORDER — MIDAZOLAM HCL 5 MG/5ML IJ SOLN
INTRAMUSCULAR | Status: DC | PRN
  Administered 2015-05-28: 1 mg via INTRAVENOUS

## 2015-05-28 MED ORDER — SODIUM CHLORIDE 0.9 % IV SOLN
1000.0000 mg | INTRAVENOUS | Status: AC
  Administered 2015-05-28: 1000 mg via INTRAVENOUS
  Filled 2015-05-28: qty 10

## 2015-05-28 MED ORDER — ACETAMINOPHEN 325 MG PO TABS
650.0000 mg | ORAL_TABLET | Freq: Four times a day (QID) | ORAL | Status: DC | PRN
Start: 2015-05-29 — End: 2015-05-30

## 2015-05-28 MED ORDER — BUPIVACAINE HCL 0.25 % IJ SOLN
INTRAMUSCULAR | Status: DC | PRN
  Administered 2015-05-28: 30 mL

## 2015-05-28 MED ORDER — ACETAMINOPHEN 650 MG RE SUPP: 650.0000 mg | Freq: Four times a day (QID) | RECTAL | Status: DC | PRN

## 2015-05-28 MED ORDER — BUPIVACAINE LIPOSOME 1.3 % IJ SUSP
INTRAMUSCULAR | Status: DC | PRN
  Administered 2015-05-28: 20 mL

## 2015-05-28 MED ORDER — CLONAZEPAM 1 MG PO TABS
1.0000 mg | ORAL_TABLET | Freq: Every day | ORAL | Status: DC
Start: 2015-05-28 — End: 2015-05-30
  Administered 2015-05-28 – 2015-05-29 (×2): 1 mg via ORAL
  Filled 2015-05-28 (×2): qty 1

## 2015-05-28 MED ORDER — PROPOFOL 10 MG/ML IV BOLUS
INTRAVENOUS | Status: DC | PRN
  Administered 2015-05-28: 30 mg via INTRAVENOUS
  Administered 2015-05-28: 20 mg via INTRAVENOUS

## 2015-05-28 MED ORDER — LACTATED RINGERS IV SOLN
INTRAVENOUS | Status: DC | PRN
Start: 2015-05-28 — End: 2015-05-28
  Administered 2015-05-28 (×3): via INTRAVENOUS

## 2015-05-28 MED ORDER — BISACODYL 10 MG RE SUPP: 10.0000 mg | Freq: Every day | RECTAL | Status: DC | PRN

## 2015-05-28 MED ORDER — METOCLOPRAMIDE HCL 10 MG PO TABS: 5.0000 mg | ORAL_TABLET | Freq: Three times a day (TID) | ORAL | Status: DC | PRN

## 2015-05-28 MED ORDER — PHENYLEPHRINE HCL 10 MG/ML IJ SOLN
INTRAMUSCULAR | Status: DC | PRN
Start: 2015-05-28 — End: 2015-05-28
  Administered 2015-05-28: 80 ug via INTRAVENOUS
  Administered 2015-05-28: 120 ug via INTRAVENOUS
  Administered 2015-05-28: 40 ug via INTRAVENOUS
  Administered 2015-05-28: 80 ug via INTRAVENOUS

## 2015-05-28 MED ORDER — TRAMADOL HCL 50 MG PO TABS
50.0000 mg | ORAL_TABLET | Freq: Four times a day (QID) | ORAL | Status: DC | PRN
  Administered 2015-05-29 – 2015-05-30 (×2): 50 mg via ORAL
  Filled 2015-05-28 (×2): qty 1

## 2015-05-28 MED ORDER — VENLAFAXINE HCL ER 75 MG PO CP24
75.0000 mg | ORAL_CAPSULE | Freq: Every day | ORAL | Status: DC
  Administered 2015-05-29 – 2015-05-30 (×2): 75 mg via ORAL
  Filled 2015-05-28 (×3): qty 1

## 2015-05-28 MED ORDER — PROMETHAZINE HCL 25 MG/ML IJ SOLN: 6.2500 mg | INTRAMUSCULAR | Status: DC | PRN

## 2015-05-28 MED ORDER — MORPHINE SULFATE 2 MG/ML IJ SOLN: 1.0000 mg | INTRAMUSCULAR | Status: DC | PRN

## 2015-05-28 MED ORDER — CEFAZOLIN SODIUM-DEXTROSE 2-3 GM-% IV SOLR
2.0000 g | Freq: Four times a day (QID) | INTRAVENOUS | Status: AC
  Administered 2015-05-28 – 2015-05-29 (×2): 2 g via INTRAVENOUS
  Filled 2015-05-28 (×2): qty 50

## 2015-05-28 MED ORDER — RIVAROXABAN 10 MG PO TABS
10.0000 mg | ORAL_TABLET | Freq: Every day | ORAL | Status: DC
  Administered 2015-05-29 – 2015-05-30 (×2): 10 mg via ORAL
  Filled 2015-05-28 (×3): qty 1

## 2015-05-28 MED ORDER — BUPIVACAINE LIPOSOME 1.3 % IJ SUSP
20.0000 mL | Freq: Once | INTRAMUSCULAR | Status: DC
  Filled 2015-05-28: qty 20

## 2015-05-28 MED ORDER — DEXTROSE 5 % IV SOLN
500.0000 mg | Freq: Four times a day (QID) | INTRAVENOUS | Status: DC | PRN
  Filled 2015-05-28: qty 5

## 2015-05-28 MED ORDER — ACETAMINOPHEN 10 MG/ML IV SOLN
INTRAVENOUS | Status: AC
  Filled 2015-05-28: qty 100

## 2015-05-28 MED ORDER — PREDNISONE 5 MG PO TABS
5.0000 mg | ORAL_TABLET | Freq: Two times a day (BID) | ORAL | Status: DC
  Administered 2015-05-30: 5 mg via ORAL
  Filled 2015-05-28 (×2): qty 1

## 2015-05-28 MED ORDER — DEXAMETHASONE SODIUM PHOSPHATE 10 MG/ML IJ SOLN
INTRAMUSCULAR | Status: AC
  Filled 2015-05-28: qty 1

## 2015-05-28 MED ORDER — BUPIVACAINE HCL (PF) 0.25 % IJ SOLN
INTRAMUSCULAR | Status: AC
  Filled 2015-05-28: qty 30

## 2015-05-28 MED ORDER — VENLAFAXINE HCL 75 MG PO TABS
112.5000 mg | ORAL_TABLET | Freq: Every day | ORAL | Status: DC
  Administered 2015-05-28 – 2015-05-30 (×2): 112.5 mg via ORAL
  Filled 2015-05-28 (×3): qty 1

## 2015-05-28 MED ORDER — SODIUM CHLORIDE 0.9 % IJ SOLN
INTRAMUSCULAR | Status: AC
  Filled 2015-05-28: qty 50

## 2015-05-28 MED ORDER — DIAZEPAM 5 MG PO TABS: 5.0000 mg | ORAL_TABLET | Freq: Four times a day (QID) | ORAL | Status: DC | PRN

## 2015-05-28 MED ORDER — CEFAZOLIN SODIUM-DEXTROSE 2-3 GM-% IV SOLR
INTRAVENOUS | Status: AC
  Filled 2015-05-28: qty 50

## 2015-05-28 MED ORDER — ACETAMINOPHEN 10 MG/ML IV SOLN
1000.0000 mg | Freq: Once | INTRAVENOUS | Status: AC
  Administered 2015-05-28: 1000 mg via INTRAVENOUS
  Filled 2015-05-28: qty 100

## 2015-05-28 MED ORDER — CEFAZOLIN SODIUM-DEXTROSE 2-3 GM-% IV SOLR
2.0000 g | INTRAVENOUS | Status: AC
  Administered 2015-05-28: 2 g via INTRAVENOUS

## 2015-05-28 MED ORDER — CHLORHEXIDINE GLUCONATE 4 % EX LIQD: 60.0000 mL | Freq: Once | CUTANEOUS | Status: DC

## 2015-05-28 MED ORDER — ONDANSETRON HCL 4 MG/2ML IJ SOLN: 4.0000 mg | Freq: Four times a day (QID) | INTRAMUSCULAR | Status: DC | PRN

## 2015-05-28 MED ORDER — ONDANSETRON HCL 4 MG PO TABS: 4.0000 mg | ORAL_TABLET | Freq: Four times a day (QID) | ORAL | Status: DC | PRN

## 2015-05-28 MED ORDER — POLYETHYLENE GLYCOL 3350 17 G PO PACK: 17.0000 g | PACK | Freq: Every day | ORAL | Status: DC | PRN

## 2015-05-28 MED ORDER — SODIUM CHLORIDE 0.9 % IV SOLN: INTRAVENOUS | Status: DC

## 2015-05-28 MED ORDER — PREGABALIN 50 MG PO CAPS
50.0000 mg | ORAL_CAPSULE | Freq: Every day | ORAL | Status: DC
  Administered 2015-05-28 – 2015-05-29 (×2): 50 mg via ORAL
  Filled 2015-05-28 (×2): qty 1

## 2015-05-28 MED ORDER — PROPOFOL 10 MG/ML IV BOLUS
INTRAVENOUS | Status: AC
  Filled 2015-05-28: qty 20

## 2015-05-28 MED ORDER — DIVALPROEX SODIUM 250 MG PO DR TAB
250.0000 mg | DELAYED_RELEASE_TABLET | Freq: Every evening | ORAL | Status: DC
  Filled 2015-05-28: qty 1

## 2015-05-28 MED ORDER — FENTANYL CITRATE (PF) 100 MCG/2ML IJ SOLN
INTRAMUSCULAR | Status: DC | PRN
  Administered 2015-05-28: 100 ug via INTRAVENOUS

## 2015-05-28 MED ORDER — DEXAMETHASONE SODIUM PHOSPHATE 10 MG/ML IJ SOLN
10.0000 mg | Freq: Once | INTRAMUSCULAR | Status: AC
  Administered 2015-05-28: 10 mg via INTRAVENOUS

## 2015-05-28 MED ORDER — DOCUSATE SODIUM 100 MG PO CAPS
100.0000 mg | ORAL_CAPSULE | Freq: Two times a day (BID) | ORAL | Status: DC
  Administered 2015-05-28 – 2015-05-30 (×4): 100 mg via ORAL

## 2015-05-28 MED ORDER — DEXAMETHASONE SODIUM PHOSPHATE 10 MG/ML IJ SOLN
10.0000 mg | Freq: Once | INTRAMUSCULAR | Status: DC
  Filled 2015-05-28: qty 1

## 2015-05-28 MED ORDER — OXYCODONE HCL 5 MG PO TABS
5.0000 mg | ORAL_TABLET | ORAL | Status: DC | PRN
  Administered 2015-05-28: 5 mg via ORAL
  Administered 2015-05-28 – 2015-05-29 (×4): 10 mg via ORAL
  Filled 2015-05-28: qty 2
  Filled 2015-05-28: qty 1
  Filled 2015-05-28 (×3): qty 2

## 2015-05-28 MED ORDER — SODIUM CHLORIDE 0.9 % IJ SOLN
INTRAMUSCULAR | Status: DC | PRN
  Administered 2015-05-28: 30 mL

## 2015-05-28 MED ORDER — MENTHOL 3 MG MT LOZG: 1.0000 | LOZENGE | OROMUCOSAL | Status: DC | PRN

## 2015-05-28 MED ORDER — PREDNISONE 20 MG PO TABS
20.0000 mg | ORAL_TABLET | Freq: Once | ORAL | Status: AC
  Administered 2015-05-28: 20 mg via ORAL
  Filled 2015-05-28: qty 1

## 2015-05-28 MED ORDER — METOCLOPRAMIDE HCL 5 MG/ML IJ SOLN: 5.0000 mg | Freq: Three times a day (TID) | INTRAMUSCULAR | Status: DC | PRN

## 2015-05-28 MED ORDER — PROPOFOL INFUSION 10 MG/ML OPTIME
INTRAVENOUS | Status: DC | PRN
  Administered 2015-05-28: 100 ug/kg/min via INTRAVENOUS

## 2015-05-28 MED ORDER — LACTATED RINGERS IV SOLN
INTRAVENOUS | Status: DC
  Administered 2015-05-28: 1000 mL via INTRAVENOUS

## 2015-05-28 MED ORDER — FENTANYL CITRATE (PF) 100 MCG/2ML IJ SOLN
INTRAMUSCULAR | Status: AC
  Filled 2015-05-28: qty 2

## 2015-05-28 MED ORDER — DIPHENHYDRAMINE HCL 12.5 MG/5ML PO ELIX: 12.5000 mg | ORAL_SOLUTION | ORAL | Status: DC | PRN

## 2015-05-28 MED ORDER — ACETAMINOPHEN 500 MG PO TABS
1000.0000 mg | ORAL_TABLET | Freq: Four times a day (QID) | ORAL | Status: AC
  Administered 2015-05-28 – 2015-05-29 (×4): 1000 mg via ORAL
  Filled 2015-05-28 (×5): qty 2

## 2015-05-28 MED ORDER — GLIMEPIRIDE 1 MG PO TABS
1.0000 mg | ORAL_TABLET | Freq: Every day | ORAL | Status: DC | PRN
  Filled 2015-05-28: qty 1

## 2015-05-28 MED ORDER — PREDNISONE 5 MG PO TABS
5.0000 mg | ORAL_TABLET | Freq: Every day | ORAL | Status: DC
  Filled 2015-05-28: qty 1

## 2015-05-28 MED ORDER — INSULIN ASPART 100 UNIT/ML ~~LOC~~ SOLN
0.0000 [IU] | Freq: Three times a day (TID) | SUBCUTANEOUS | Status: DC
  Administered 2015-05-28: 8 [IU] via SUBCUTANEOUS
  Administered 2015-05-29: 5 [IU] via SUBCUTANEOUS
  Administered 2015-05-29 – 2015-05-30 (×3): 3 [IU] via SUBCUTANEOUS

## 2015-05-28 MED ORDER — PREDNISONE 10 MG PO TABS
10.0000 mg | ORAL_TABLET | Freq: Two times a day (BID) | ORAL | Status: AC
  Administered 2015-05-29 (×2): 10 mg via ORAL
  Filled 2015-05-28 (×2): qty 1

## 2015-05-28 MED ORDER — PHENOL 1.4 % MT LIQD
1.0000 | OROMUCOSAL | Status: DC | PRN
  Filled 2015-05-28: qty 177

## 2015-05-28 MED ORDER — SODIUM CHLORIDE 0.9 % IV SOLN
INTRAVENOUS | Status: DC
  Administered 2015-05-28: 15:00:00 via INTRAVENOUS

## 2015-05-28 MED ORDER — HYDROMORPHONE HCL 1 MG/ML IJ SOLN: 0.2500 mg | INTRAMUSCULAR | Status: DC | PRN

## 2015-05-28 SURGICAL SUPPLY — 62 items
BAG DECANTER FOR FLEXI CONT (MISCELLANEOUS) IMPLANT
BAG ZIPLOCK 12X15 (MISCELLANEOUS) ×2 IMPLANT
BANDAGE ELASTIC 6 VELCRO ST LF (GAUZE/BANDAGES/DRESSINGS) ×2 IMPLANT
BANDAGE ESMARK 6X9 LF (GAUZE/BANDAGES/DRESSINGS) ×1 IMPLANT
BLADE SAG 18X100X1.27 (BLADE) ×2 IMPLANT
BLADE SAW SGTL 11.0X1.19X90.0M (BLADE) ×2 IMPLANT
BNDG ESMARK 6X9 LF (GAUZE/BANDAGES/DRESSINGS) ×2
BOWL SMART MIX CTS (DISPOSABLE) ×2 IMPLANT
CAP KNEE TOTAL 3 SIGMA ×2 IMPLANT
CEMENT HV SMART SET (Cement) ×4 IMPLANT
CHLORAPREP W/TINT 26ML (MISCELLANEOUS) ×2 IMPLANT
CUFF TOURN SGL QUICK 34 (TOURNIQUET CUFF) ×1
CUFF TRNQT CYL 34X4X40X1 (TOURNIQUET CUFF) ×1 IMPLANT
DECANTER SPIKE VIAL GLASS SM (MISCELLANEOUS) ×2 IMPLANT
DRAPE EXTREMITY T 121X128X90 (DRAPE) ×2 IMPLANT
DRAPE POUCH INSTRU U-SHP 10X18 (DRAPES) ×2 IMPLANT
DRAPE U-SHAPE 47X51 STRL (DRAPES) ×2 IMPLANT
DRSG ADAPTIC 3X8 NADH LF (GAUZE/BANDAGES/DRESSINGS) ×2 IMPLANT
DRSG PAD ABDOMINAL 8X10 ST (GAUZE/BANDAGES/DRESSINGS) ×2 IMPLANT
DURAPREP 26ML APPLICATOR (WOUND CARE) IMPLANT
ELECT REM PT RETURN 9FT ADLT (ELECTROSURGICAL) ×2
ELECTRODE REM PT RTRN 9FT ADLT (ELECTROSURGICAL) ×1 IMPLANT
EVACUATOR 1/8 PVC DRAIN (DRAIN) ×2 IMPLANT
FACESHIELD WRAPAROUND (MASK) ×10 IMPLANT
GAUZE SPONGE 4X4 12PLY STRL (GAUZE/BANDAGES/DRESSINGS) ×2 IMPLANT
GLOVE BIO SURGEON STRL SZ7.5 (GLOVE) IMPLANT
GLOVE BIO SURGEON STRL SZ8 (GLOVE) ×2 IMPLANT
GLOVE BIOGEL PI IND STRL 6.5 (GLOVE) ×1 IMPLANT
GLOVE BIOGEL PI IND STRL 8 (GLOVE) ×1 IMPLANT
GLOVE BIOGEL PI INDICATOR 6.5 (GLOVE) ×1
GLOVE BIOGEL PI INDICATOR 8 (GLOVE) ×1
GLOVE SURG SS PI 6.5 STRL IVOR (GLOVE) IMPLANT
GOWN STRL REUS W/TWL LRG LVL3 (GOWN DISPOSABLE) ×2 IMPLANT
GOWN STRL REUS W/TWL XL LVL3 (GOWN DISPOSABLE) IMPLANT
HANDPIECE INTERPULSE COAX TIP (DISPOSABLE) ×1
IMMOBILIZER KNEE 20 (SOFTGOODS) ×2
IMMOBILIZER KNEE 20 THIGH 36 (SOFTGOODS) ×1 IMPLANT
KIT BASIN OR (CUSTOM PROCEDURE TRAY) ×2 IMPLANT
MANIFOLD NEPTUNE II (INSTRUMENTS) ×2 IMPLANT
NDL SAFETY ECLIPSE 18X1.5 (NEEDLE) ×2 IMPLANT
NEEDLE HYPO 18GX1.5 SHARP (NEEDLE) ×2
NS IRRIG 1000ML POUR BTL (IV SOLUTION) ×2 IMPLANT
PACK TOTAL JOINT (CUSTOM PROCEDURE TRAY) ×2 IMPLANT
PADDING CAST COTTON 6X4 STRL (CAST SUPPLIES) ×2 IMPLANT
PEN SKIN MARKING BROAD (MISCELLANEOUS) ×2 IMPLANT
POSITIONER SURGICAL ARM (MISCELLANEOUS) ×2 IMPLANT
SET HNDPC FAN SPRY TIP SCT (DISPOSABLE) ×1 IMPLANT
STRIP CLOSURE SKIN 1/2X4 (GAUZE/BANDAGES/DRESSINGS) ×2 IMPLANT
SUCTION FRAZIER 12FR DISP (SUCTIONS) ×2 IMPLANT
SUT MNCRL AB 4-0 PS2 18 (SUTURE) ×2 IMPLANT
SUT VIC AB 2-0 CT1 27 (SUTURE) ×3
SUT VIC AB 2-0 CT1 TAPERPNT 27 (SUTURE) ×3 IMPLANT
SUT VLOC 180 0 24IN GS25 (SUTURE) ×2 IMPLANT
SYR 20CC LL (SYRINGE) ×2 IMPLANT
SYR 50ML LL SCALE MARK (SYRINGE) ×2 IMPLANT
TOWEL OR 17X26 10 PK STRL BLUE (TOWEL DISPOSABLE) ×2 IMPLANT
TOWEL OR NON WOVEN STRL DISP B (DISPOSABLE) ×2 IMPLANT
TRAY FOLEY CATH 16FRSI W/METER (SET/KITS/TRAYS/PACK) ×2 IMPLANT
TRAY FOLEY W/METER SILVER 14FR (SET/KITS/TRAYS/PACK) IMPLANT
WATER STERILE IRR 1500ML POUR (IV SOLUTION) ×2 IMPLANT
WRAP KNEE MAXI GEL POST OP (GAUZE/BANDAGES/DRESSINGS) ×2 IMPLANT
YANKAUER SUCT BULB TIP 10FT TU (MISCELLANEOUS) ×2 IMPLANT

## 2015-05-28 NOTE — Anesthesia Postprocedure Evaluation (Signed)
Anesthesia Post Note  Patient: Jared Bridges  Procedure(s) Performed: Procedure(s) (LRB): LEFT TOTAL KNEE ARTHROPLASTY (Left)  Anesthesia type: MAC/SAB  Patient location: PACU  Post pain: Pain level controlled  Post assessment: Patient's Cardiovascular Status Stable  Last Vitals:  Filed Vitals:   05/28/15 1428  BP: 125/59  Pulse: 61  Temp: 36.4 C  Resp: 16    Post vital signs: Reviewed and stable  Level of consciousness: sedated  Complications: No apparent anesthesia complications

## 2015-05-28 NOTE — Anesthesia Preprocedure Evaluation (Addendum)
Anesthesia Evaluation  Patient identified by MRN, date of birth, ID band Patient awake    Reviewed: Allergy & Precautions, NPO status , Patient's Chart, lab work & pertinent test results  History of Anesthesia Complications Negative for: history of anesthetic complications  Airway Mallampati: II  TM Distance: >3 FB Neck ROM: Full    Dental  (+) Teeth Intact, Dental Advisory Given   Pulmonary former smoker,    Pulmonary exam normal       Cardiovascular hypertension, Normal cardiovascular exam    Neuro/Psych  Headaches, PSYCHIATRIC DISORDERS Depression  Neuromuscular disease    GI/Hepatic Neg liver ROS, PUD,   Endo/Other  diabetes  Renal/GU negative Renal ROS     Musculoskeletal   Abdominal   Peds  Hematology   Anesthesia Other Findings   Reproductive/Obstetrics                            Anesthesia Physical Anesthesia Plan  ASA: III  Anesthesia Plan: MAC and Spinal   Post-op Pain Management:    Induction: Intravenous  Airway Management Planned: Simple Face Mask  Additional Equipment:   Intra-op Plan:   Post-operative Plan:   Informed Consent: I have reviewed the patients History and Physical, chart, labs and discussed the procedure including the risks, benefits and alternatives for the proposed anesthesia with the patient or authorized representative who has indicated his/her understanding and acceptance.   Dental advisory given  Plan Discussed with: CRNA, Anesthesiologist and Surgeon  Anesthesia Plan Comments:        Anesthesia Quick Evaluation

## 2015-05-28 NOTE — Interval H&P Note (Signed)
History and Physical Interval Note:  05/28/2015 10:29 AM  Jared Bridges  has presented today for surgery, with the diagnosis of OA OF LEFT KNEE  The various methods of treatment have been discussed with the patient and family. After consideration of risks, benefits and other options for treatment, the patient has consented to  Procedure(s): LEFT TOTAL KNEE ARTHROPLASTY (Left) as a surgical intervention .  The patient's history has been reviewed, patient examined, no change in status, stable for surgery.  I have reviewed the patient's chart and labs.  Questions were answered to the patient's satisfaction.     [name]

## 2015-05-28 NOTE — Transfer of Care (Signed)
Immediate Anesthesia Transfer of Care Note  Patient: Jared Bridges  Procedure(s) Performed: Procedure(s): LEFT TOTAL KNEE ARTHROPLASTY (Left)  Patient Location: PACU  Anesthesia Type:Spinal  Level of Consciousness: awake, alert , oriented and patient cooperative  Airway & Oxygen Therapy: Patient Spontanous Breathing and Patient connected to face mask oxygen  Post-op Assessment: Report given to RN and Post -op Vital signs reviewed and stable  Post vital signs: stable  Last Vitals:  Filed Vitals:   05/28/15 0926  BP: 125/59  Pulse: 65  Temp: 36.6 C  Resp: 18    Complications: No apparent anesthesia complications  L1 spinal level

## 2015-05-28 NOTE — Anesthesia Procedure Notes (Signed)
Spinal Patient location during procedure: OR End time: 05/28/2015 11:16 AM Staffing Resident/CRNA: Enrigue Catena E Performed by: anesthesiologist  Preanesthetic Checklist Completed: patient identified, site marked, surgical consent, pre-op evaluation, timeout performed, IV checked, risks and benefits discussed and monitors and equipment checked Spinal Block Patient position: sitting Prep: ChloraPrep Patient monitoring: heart rate, continuous pulse ox and blood pressure Approach: right paramedian Location: L3-4 Injection technique: single-shot Needle Needle type: Spinocan  Needle gauge: 22 G Needle length: 9 cm Assessment Sensory level: T4 Additional Notes Expiration date of kit checked and confirmed. Patient tolerated procedure well, without complications.Negative heme or paresthesia, CSF x 3,

## 2015-05-28 NOTE — Op Note (Signed)
Pre-operative diagnosis- Osteoarthritis  Left knee(s)  Post-operative diagnosis- Osteoarthritis Left knee(s)  Procedure-  Left  Total Knee Arthroplasty  Surgeon- Dione Plover. Niesha Bame, MD  Assistant- Ardeen Jourdain, PA-C   Anesthesia-  Spinal  EBL-* No blood loss amount entered *   Drains Hemovac  Tourniquet time-  Total Tourniquet Time Documented: Thigh (Left) - 33 minutes Total: Thigh (Left) - 33 minutes     Complications- None  Condition-PACU - hemodynamically stable.   Brief Clinical Note   Jared Bridges is a 81 y.o. year old male with end stage OA of his left knee with progressively worsening pain and dysfunction. He has constant pain, with activity and at rest and significant functional deficits with difficulties even with ADLs. He has had extensive non-op management including analgesics, injections of cortisone, and home exercise program, but remains in significant pain with significant dysfunction. Radiographs show bone on bone arthritis medial. He presents now for left Total Knee Arthroplasty.     Procedure in detail---   The patient is brought into the operating room and positioned supine on the operating table. After successful administration of  Spinal,   a tourniquet is placed high on the  Left thigh(s) and the lower extremity is prepped and draped in the usual sterile fashion. Time out is performed by the operating team and then the  Left lower extremity is wrapped in Esmarch, knee flexed and the tourniquet inflated to 300 mmHg.       A midline incision is made with a ten blade through the subcutaneous tissue to the level of the extensor mechanism. A fresh blade is used to make a medial parapatellar arthrotomy. Soft tissue over the proximal medial tibia is subperiosteally elevated to the joint line with a knife and into the semimembranosus bursa with a Cobb elevator. Soft tissue over the proximal lateral tibia is elevated with attention being paid to avoiding the patellar  tendon on the tibial tubercle. The patella is everted, knee flexed 90 degrees and the ACL and PCL are removed. Findings are bone on bone medial with large medial osteophytes.        The drill is used to create a starting hole in the distal femur and the canal is thoroughly irrigated with sterile saline to remove the fatty contents. The 5 degree Left  valgus alignment guide is placed into the femoral canal and the distal femoral cutting block is pinned to remove 10 mm off the distal femur. Resection is made with an oscillating saw.      The tibia is subluxed forward and the menisci are removed. The extramedullary alignment guide is placed referencing proximally at the medial aspect of the tibial tubercle and distally along the second metatarsal axis and tibial crest. The block is pinned to remove 102mm off the more deficient medial  side. Resection is made with an oscillating saw. Size 4is the most appropriate size for the tibia and the proximal tibia is prepared with the modular drill and keel punch for that size.      The femoral sizing guide is placed and size 4 is most appropriate. Rotation is marked off the epicondylar axis and confirmed by creating a rectangular flexion gap at 90 degrees. The size 4 cutting block is pinned in this rotation and the anterior, posterior and chamfer cuts are made with the oscillating saw. The intercondylar block is then placed and that cut is made.      Trial size 4 tibial component, trial size 4 posterior  stabilized femur and a 12.5  mm posterior stabilized rotating platform insert trial is placed. Full extension is achieved with excellent varus/valgus and anterior/posterior balance throughout full range of motion. The patella is everted and thickness measured to be 24 mm. Free hand resection is taken to 14 mm, a 38 template is placed, lug holes are drilled, trial patella is placed, and it tracks normally. Osteophytes are removed off the posterior femur with the trial in place.  All trials are removed and the cut bone surfaces prepared with pulsatile lavage. Cement is mixed and once ready for implantation, the size 4 tibial implant, size  4 posterior stabilized femoral component, and the size 38 patella are cemented in place and the patella is held with the clamp. The trial insert is placed and the knee held in full extension. The Exparel (20 ml mixed with 30 ml saline) and .25% Bupivicaine, are injected into the extensor mechanism, posterior capsule, medial and lateral gutters and subcutaneous tissues.  All extruded cement is removed and once the cement is hard the permanent 12.5 mm posterior stabilized rotating platform insert is placed into the tibial tray.      The wound is copiously irrigated with saline solution and the extensor mechanism closed over a hemovac drain with #1 V-loc suture. The tourniquet is released for a total tourniquet time of 33  minutes. Flexion against gravity is 140 degrees and the patella tracks normally. Subcutaneous tissue is closed with 2.0 vicryl and subcuticular with running 4.0 Monocryl. The incision is cleaned and dried and steri-strips and a bulky sterile dressing are applied. The limb is placed into a knee immobilizer and the patient is awakened and transported to recovery in stable condition.      Please note that a surgical assistant was a medical necessity for this procedure in order to perform it in a safe and expeditious manner. Surgical assistant was necessary to retract the ligaments and vital neurovascular structures to prevent injury to them and also necessary for proper positioning of the limb to allow for anatomic placement of the prosthesis.   Dione Plover Reyanne Hussar, MD    05/28/2015, 12:17 PM

## 2015-05-28 NOTE — Progress Notes (Signed)
Utilization review completed.  

## 2015-05-29 ENCOUNTER — Encounter (HOSPITAL_COMMUNITY): Payer: Self-pay | Admitting: Orthopedic Surgery

## 2015-05-29 LAB — GLUCOSE, CAPILLARY
GLUCOSE-CAPILLARY: 173 mg/dL — AB (ref 65–99)
Glucose-Capillary: 175 mg/dL — ABNORMAL HIGH (ref 65–99)
Glucose-Capillary: 225 mg/dL — ABNORMAL HIGH (ref 65–99)
Glucose-Capillary: 185 mg/dL — ABNORMAL HIGH (ref 65–99)

## 2015-05-29 LAB — CBC
HCT: 35.5 % — ABNORMAL LOW (ref 39.0–52.0)
Hemoglobin: 11.4 g/dL — ABNORMAL LOW (ref 13.0–17.0)
MCH: 30.8 pg (ref 26.0–34.0)
MCHC: 32.1 g/dL (ref 30.0–36.0)
MCV: 95.9 fL (ref 78.0–100.0)
PLATELETS: 182 10*3/uL (ref 150–400)
RBC: 3.7 MIL/uL — ABNORMAL LOW (ref 4.22–5.81)
RDW: 12.8 % (ref 11.5–15.5)
WBC: 23.1 10*3/uL — ABNORMAL HIGH (ref 4.0–10.5)

## 2015-05-29 LAB — BASIC METABOLIC PANEL
ANION GAP: 6 (ref 5–15)
BUN: 20 mg/dL (ref 6–20)
CHLORIDE: 102 mmol/L (ref 101–111)
CO2: 28 mmol/L (ref 22–32)
Calcium: 8.1 mg/dL — ABNORMAL LOW (ref 8.9–10.3)
Creatinine, Ser: 0.96 mg/dL (ref 0.61–1.24)
GFR calc non Af Amer: >60 mL/min (ref >60)
GLUCOSE: 196 mg/dL — AB (ref 65–99)
Potassium: 4.4 mmol/L (ref 3.5–5.1)
Sodium: 136 mmol/L (ref 135–145)

## 2015-05-29 MED ORDER — OXYCODONE HCL 5 MG PO TABS
5.0000 mg | ORAL_TABLET | ORAL | Status: DC | PRN
Start: 2015-05-29 — End: 2015-07-12

## 2015-05-29 MED ORDER — TRAMADOL HCL 50 MG PO TABS: 50.0000 mg | ORAL_TABLET | Freq: Four times a day (QID) | ORAL | Status: DC | PRN

## 2015-05-29 MED ORDER — RIVAROXABAN 10 MG PO TABS: 10.0000 mg | ORAL_TABLET | Freq: Every day | ORAL | Status: DC

## 2015-05-29 MED ORDER — METHOCARBAMOL 500 MG PO TABS: 500.0000 mg | ORAL_TABLET | Freq: Four times a day (QID) | ORAL | Status: DC | PRN

## 2015-05-29 NOTE — Progress Notes (Signed)
   05/29/15 1500  PT Visit Information  Last PT Received On 05/29/15  Assistance Needed +1  History of Present Illness s/p L TKA  PT Time Calculation  PT Start Time (ACUTE ONLY) 1457  PT Stop Time (ACUTE ONLY) 1516  PT Time Calculation (min) (ACUTE ONLY) 19 min  Subjective Data  Patient Stated Goal regain independence  Precautions  Precautions Knee  Precaution Comments did SLR   Required Braces or Orthoses Knee Immobilizer - Left  Knee Immobilizer - Left Discontinue once straight leg raise with < 10 degree lag  Restrictions  Weight Bearing Restrictions No  Other Position/Activity Restrictions WBAT  Pain Assessment  Pain Assessment 0-10  Pain Score 3  Pain Location L knee  Pain Descriptors / Indicators Aching  Pain Intervention(s) Limited activity within patient's tolerance;Monitored during session;Premedicated before session  Cognition  Arousal/Alertness Awake/alert  Behavior During Therapy WFL for tasks assessed/performed  Overall Cognitive Status Impaired/Different from baseline  Area of Impairment Safety/judgement  Safety/Judgement Decreased awareness of safety  Bed Mobility  Overal bed mobility Needs Assistance  Bed Mobility Supine to Sit;Sit to Supine  Supine to sit Min guard  Sit to supine Min guard  General bed mobility comments min/guard for LLE, cues for technique  Transfers  Overall transfer level Needs assistance  Equipment used Rolling walker (2 wheeled)  Transfers Sit to/from Stand  Sit to Stand Min assist;Min guard  General transfer comment cues for hand placement and LE management  Ambulation/Gait  Ambulation/Gait assistance Min guard;Supervision  Ambulation Distance (Feet) 120 Feet  Assistive device Rolling walker (2 wheeled)  General Gait Details cues for sequence and  RW position from self  Gait Pattern/deviations Step-to pattern;Trunk flexed  PT - End of Session  Equipment Utilized During Treatment Gait belt;Left knee immobilizer  Activity  Tolerance Patient tolerated treatment well  Patient left in bed;with call bell/phone within reach;with bed alarm set  Nurse Communication Mobility status  PT - Assessment/Plan  PT Plan Current plan remains appropriate  PT Frequency (ACUTE ONLY) 7X/week  Follow Up Recommendations Home health PT  PT equipment None recommended by PT  PT Goal Progression  Progress towards PT goals Progressing toward goals  Acute Rehab PT Goals  PT Goal Formulation With patient  Time For Goal Achievement 06/05/15  Potential to Achieve Goals Good  PT General Charges  $$ ACUTE PT VISIT 1 Procedure  PT Treatments  $Gait Training 8-22 mins

## 2015-05-29 NOTE — Progress Notes (Addendum)
   Subjective: 1 Day Post-Op Procedure(s) (LRB): LEFT TOTAL KNEE ARTHROPLASTY (Left) Patient reports pain as mild.   Patient seen in rounds with Dr. Wynelle Link.  Doing fine this morning. Patient is well, and has had no acute complaints or problems We will start therapy today.  Plan is to go Home after hospital stay.  Objective: Vital signs in last 24 hours: Temp:  [97.3 F (36.3 C)-98.6 F (37 C)] 97.9 F (36.6 C) (06/28 0504) Pulse Rate:  [56-82] 67 (06/28 0504) Resp:  [10-19] 14 (06/28 0504) BP: (113-149)/(48-72) 127/48 mmHg (06/28 0504) SpO2:  [97 %-100 %] 98 % (06/28 0504) Weight:  [94.802 kg (209 lb)] 94.802 kg (209 lb) (06/27 0923)  Intake/Output from previous day:  Intake/Output Summary (Last 24 hours) at 05/29/15 0918 Last data filed at 05/29/15 0620  Gross per 24 hour  Intake   3865 ml  Output   1728 ml  Net   2137 ml    Intake/Output this shift: UOP 875 since around MN  Labs:  Recent Labs  05/29/15 0410  HGB 11.4*    Recent Labs  05/29/15 0410  WBC 23.1*  RBC 3.70*  HCT 35.5*  PLT 182    Recent Labs  05/29/15 0410  NA 136  K 4.4  CL 102  CO2 28  BUN 20  CREATININE 0.96  GLUCOSE 196*  CALCIUM 8.1*   No results for input(s): LABPT, INR in the last 72 hours.  EXAM General - Patient is Alert, Appropriate and Oriented Extremity - Neurovascular intact Sensation intact distally Dorsiflexion/Plantar flexion intact Dressing - dressing C/D/I Motor Function - intact, moving foot and toes well on exam.  Hemovac pulled without difficulty.  Past Medical History  Diagnosis Date  . Chronic headaches   . Type 2 diabetes mellitus   . Essential hypertension   . Diverticulitis   . Syncope   . Hypercholesteremia   . GI bleed     Dr. Laural Golden - 1998  . Peptic ulcer disease   . Anemia, iron deficiency   . Depression   . Chronic kidney disease   . Peripheral neuropathy     Assessment/Plan: 1 Day Post-Op Procedure(s) (LRB): LEFT TOTAL KNEE  ARTHROPLASTY (Left) Principal Problem:   OA (osteoarthritis) of knee  Estimated body mass index is 28.62 kg/(m^2) as calculated from the following:   Height as of this encounter: 5' 11.65" (1.82 m).   Weight as of this encounter: 94.802 kg (209 lb). Advance diet Up with therapy Plan for discharge tomorrow Discharge home with home health  DVT Prophylaxis - Xarelto Weight-Bearing as tolerated to left leg D/C O2 and Pulse OX and try on Room Air  Comments:  Patient is scheduled to start outpatient therapy at [facility] in [place] on 06/08/2015.  Arlee Muslim, PA-C Orthopaedic Surgery 05/29/2015, 9:18 AM

## 2015-05-29 NOTE — Discharge Summary (Signed)
Physician Discharge Summary   Patient ID: Jared Bridges MRN: [id number] DOB/AGE: 1930-05-16 81 y.o.  Admit date: 05/28/2015 Discharge date: 05-30-2015  Primary Diagnosis:  Osteoarthritis Left knee(s)  Admission Diagnoses:  Past Medical History  Diagnosis Date  . Chronic headaches   . Type 2 diabetes mellitus   . Essential hypertension   . Diverticulitis   . Syncope   . Hypercholesteremia   . GI bleed     Dr. Laural Golden - 1998  . Peptic ulcer disease   . Anemia, iron deficiency   . Depression   . Chronic kidney disease   . Peripheral neuropathy    Discharge Diagnoses:   Principal Problem:   OA (osteoarthritis) of knee  Estimated body mass index is 28.62 kg/(m^2) as calculated from the following:   Height as of this encounter: 5' 11.65" (1.82 m).   Weight as of this encounter: 94.802 kg (209 lb).  Procedure:  Procedure(s) (LRB): LEFT TOTAL KNEE ARTHROPLASTY (Left)   Consults: None  HPI: Jared Bridges is a 81 y.o. year old male with end stage OA of his left knee with progressively worsening pain and dysfunction. He has constant pain, with activity and at rest and significant functional deficits with difficulties even with ADLs. He has had extensive non-op management including analgesics, injections of cortisone, and home exercise program, but remains in significant pain with significant dysfunction. Radiographs show bone on bone arthritis medial. He presents now for left Total Knee Arthroplasty.   Laboratory Data: Admission on 05/28/2015  Component Date Value Ref Range Status  . Glucose-Capillary 05/28/2015 131* 65 - 99 mg/dL Final  . Comment 1 05/28/2015 Notify RN   Final  . Glucose-Capillary 05/28/2015 111* 65 - 99 mg/dL Final  . Comment 1 05/28/2015 Document in Chart   Final  . Glucose-Capillary 05/28/2015 258* 65 - 99 mg/dL Final  . WBC 05/29/2015 23.1* 4.0 - 10.5 K/uL Final  . RBC 05/29/2015 3.70* 4.22 - 5.81 MIL/uL Final  . Hemoglobin 05/29/2015 11.4* 13.0 - 17.0  g/dL Final  . HCT 05/29/2015 35.5* 39.0 - 52.0 % Final  . MCV 05/29/2015 95.9  78.0 - 100.0 fL Final  . MCH 05/29/2015 30.8  26.0 - 34.0 pg Final  . MCHC 05/29/2015 32.1  30.0 - 36.0 g/dL Final  . RDW 05/29/2015 12.8  11.5 - 15.5 % Final  . Platelets 05/29/2015 182  150 - 400 K/uL Final  . Sodium 05/29/2015 136  135 - 145 mmol/L Final  . Potassium 05/29/2015 4.4  3.5 - 5.1 mmol/L Final  . Chloride 05/29/2015 102  101 - 111 mmol/L Final  . CO2 05/29/2015 28  22 - 32 mmol/L Final  . Glucose, Bld 05/29/2015 196* 65 - 99 mg/dL Final  . BUN 05/29/2015 20  6 - 20 mg/dL Final  . Creatinine, Ser 05/29/2015 0.96  0.61 - 1.24 mg/dL Final  . Calcium 05/29/2015 8.1* 8.9 - 10.3 mg/dL Final  . GFR calc non Af Amer 05/29/2015 >60  >60 mL/min Final  . GFR calc Af Amer 05/29/2015 >60  >60 mL/min Final   Comment: (NOTE) The eGFR has been calculated using the CKD EPI equation. This calculation has not been validated in all clinical situations. eGFR's persistently <60 mL/min signify possible Chronic Kidney Disease.   . Anion gap 05/29/2015 6  5 - 15 Final  . Glucose-Capillary 05/28/2015 245* 65 - 99 mg/dL Final  . Glucose-Capillary 05/29/2015 175* 65 - 99 mg/dL Final  . Glucose-Capillary 05/29/2015 225* 65 - 99  mg/dL Final  . Glucose-Capillary 05/29/2015 173* 65 - 99 mg/dL Final  . Glucose-Capillary 05/29/2015 185* 65 - 99 mg/dL Final  Hospital Outpatient Visit on 05/21/2015  Component Date Value Ref Range Status  . MRSA, PCR 05/21/2015 NEGATIVE  NEGATIVE Final  . Staphylococcus aureus 05/21/2015 NEGATIVE  NEGATIVE Final   Comment:        The Xpert SA Assay (FDA approved for NASAL specimens in patients over 50 years of age), is one component of a comprehensive surveillance program.  Test performance has been validated by [facility] for patients greater than or equal to 73 year old. It is not intended to diagnose infection nor to guide or monitor treatment.   [name] Kitchen aPTT 05/21/2015 30  24 - 37  seconds Final  . WBC 05/21/2015 13.1* 4.0 - 10.5 K/uL Final  . RBC 05/21/2015 4.57  4.22 - 5.81 MIL/uL Final  . Hemoglobin 05/21/2015 14.5  13.0 - 17.0 g/dL Final  . HCT 05/21/2015 44.8  39.0 - 52.0 % Final  . MCV 05/21/2015 98.0  78.0 - 100.0 fL Final  . MCH 05/21/2015 31.7  26.0 - 34.0 pg Final  . MCHC 05/21/2015 32.4  30.0 - 36.0 g/dL Final  . RDW 05/21/2015 13.3  11.5 - 15.5 % Final  . Platelets 05/21/2015 210  150 - 400 K/uL Final  . Sodium 05/21/2015 140  135 - 145 mmol/L Final  . Potassium 05/21/2015 5.3* 3.5 - 5.1 mmol/L Final   SLIGHT HEMOLYSIS  . Chloride 05/21/2015 103  101 - 111 mmol/L Final  . CO2 05/21/2015 30  22 - 32 mmol/L Final  . Glucose, Bld 05/21/2015 171* 65 - 99 mg/dL Final  . BUN 05/21/2015 28* 6 - 20 mg/dL Final  . Creatinine, Ser 05/21/2015 1.06  0.61 - 1.24 mg/dL Final  . Calcium 05/21/2015 9.6  8.9 - 10.3 mg/dL Final  . Total Protein 05/21/2015 7.4  6.5 - 8.1 g/dL Final  . Albumin 05/21/2015 4.1  3.5 - 5.0 g/dL Final  . AST 05/21/2015 26  15 - 41 U/L Final  . ALT 05/21/2015 26  17 - 63 U/L Final  . Alkaline Phosphatase 05/21/2015 74  38 - 126 U/L Final  . Total Bilirubin 05/21/2015 0.7  0.3 - 1.2 mg/dL Final  . GFR calc non Af Amer 05/21/2015 >60  >60 mL/min Final  . GFR calc Af Amer 05/21/2015 >60  >60 mL/min Final   Comment: (NOTE) The eGFR has been calculated using the CKD EPI equation. This calculation has not been validated in all clinical situations. eGFR's persistently <60 mL/min signify possible Chronic Kidney Disease.   . Anion gap 05/21/2015 7  5 - 15 Final  . Prothrombin Time 05/21/2015 13.7  11.6 - 15.2 seconds Final  . INR 05/21/2015 1.03  0.00 - 1.49 Final  . ABO/RH(D) 05/21/2015 O POS   Final  . Antibody Screen 05/21/2015 NEG   Final  . Sample Expiration 05/21/2015 05/31/2015   Final  . Color, Urine 05/21/2015 YELLOW  YELLOW Final  . APPearance 05/21/2015 CLEAR  CLEAR Final  . Specific Gravity, Urine 05/21/2015 1.028  1.005 - 1.030  Final  . pH 05/21/2015 6.0  5.0 - 8.0 Final  . Glucose, UA 05/21/2015 NEGATIVE  NEGATIVE mg/dL Final  . Hgb urine dipstick 05/21/2015 TRACE* NEGATIVE Final  . Bilirubin Urine 05/21/2015 NEGATIVE  NEGATIVE Final  . Ketones, ur 05/21/2015 NEGATIVE  NEGATIVE mg/dL Final  . Protein, ur 05/21/2015 NEGATIVE  NEGATIVE mg/dL Final  . Concepcion Elk,  UA 05/21/2015 1.0  0.0 - 1.0 mg/dL Final  . Nitrite 05/21/2015 NEGATIVE  NEGATIVE Final  . Leukocytes, UA 05/21/2015 NEGATIVE  NEGATIVE Final  . RBC / HPF 05/21/2015 0-2  <3 RBC/hpf Final  . Urine-Other 05/21/2015 MUCOUS PRESENT   Final  . ABO/RH(D) 05/21/2015 O POS   Final     X-Rays:No results found.  EKG: Orders placed or performed in visit on 03/08/15  . EKG 12-Lead     Hospital Course: MUADH CREASY is a 81 y.o. who was admitted to [facility]. They were brought to the operating room on 05/28/2015 and underwent Procedure(s): LEFT TOTAL KNEE ARTHROPLASTY.  Patient tolerated the procedure well and was later transferred to the recovery room and then to the orthopaedic floor for postoperative care.  They were given PO and IV analgesics for pain control following their surgery.  They were given 24 hours of postoperative antibiotics of  Anti-infectives    Start     Dose/Rate Route Frequency Ordered Stop   05/28/15 1800  ceFAZolin (ANCEF) IVPB 2 g/50 mL premix     2 g 100 mL/hr over 30 Minutes Intravenous Every 6 hours 05/28/15 1433 05/29/15 0146   05/28/15 0923  ceFAZolin (ANCEF) IVPB 2 g/50 mL premix     2 g 100 mL/hr over 30 Minutes Intravenous On call to O.R. 05/28/15 1610 05/28/15 1122     and started on DVT prophylaxis in the form of Xarelto.   PT and OT were ordered for total joint protocol.  Discharge planning consulted to help with postop disposition and equipment needs.  Patient had a decent night on the evening of surgery.  They started to get up OOB with therapy on day one. Hemovac drain was pulled without difficulty.  Continued  to work with therapy into day two.  Dressing was changed on day two and the incision was healing well. Patient was seen in rounds and was ready to go home later on day two.  Discharge home with home health Diet - Cardiac diet and Diabetic diet Follow up - in 2 weeks Activity - WBAT Disposition - Home Condition Upon Discharge - Good D/C Meds - See DC Summary DVT Prophylaxis - Xarelto  Discharge Instructions    Call MD / Call 911    Complete by:  As directed   If you experience chest pain or shortness of breath, CALL 911 and be transported to the hospital emergency room.  If you develope a fever above 101 F, pus (white drainage) or increased drainage or redness at the wound, or calf pain, call your surgeon's office.     Change dressing    Complete by:  As directed   Change dressing daily with sterile 4 x 4 inch gauze dressing and apply TED hose. Do not submerge the incision under water.     Constipation Prevention    Complete by:  As directed   Drink plenty of fluids.  Prune juice may be helpful.  You may use a stool softener, such as Colace (over the counter) 100 mg twice a day.  Use MiraLax (over the counter) for constipation as needed.     Diet - low sodium heart healthy    Complete by:  As directed      Diet Carb Modified    Complete by:  As directed      Discharge instructions    Complete by:  As directed   Pick up stool softner and laxative for home use  following surgery while on pain medications. Do not submerge incision under water. Please use good hand washing techniques while changing dressing each day. May shower starting three days after surgery. Please use a clean towel to pat the incision dry following showers. Continue to use ice for pain and swelling after surgery. Do not use any lotions or creams on the incision until instructed by your surgeon.  Take Xarelto for two and a half more weeks, then discontinue Xarelto. Once the patient has completed the blood thinner  regimen, then take a Baby 81 mg Aspirin daily for three more weeks.  Postoperative Constipation Protocol  Constipation - defined medically as fewer than three stools per week and severe constipation as less than one stool per week.  One of the most common issues patients have following surgery is constipation.  Even if you have a regular bowel pattern at home, your normal regimen is likely to be disrupted due to multiple reasons following surgery.  Combination of anesthesia, postoperative narcotics, change in appetite and fluid intake all can affect your bowels.  In order to avoid complications following surgery, here are some recommendations in order to help you during your recovery period.  Colace (docusate) - Pick up an over-the-counter form of Colace or another stool softener and take twice a day as long as you are requiring postoperative pain medications.  Take with a full glass of water daily.  If you experience loose stools or diarrhea, hold the colace until you stool forms back up.  If your symptoms do not get better within 1 week or if they get worse, check with your doctor.  Dulcolax (bisacodyl) - Pick up over-the-counter and take as directed by the product packaging as needed to assist with the movement of your bowels.  Take with a full glass of water.  Use this product as needed if not relieved by Colace only.   MiraLax (polyethylene glycol) - Pick up over-the-counter to have on hand.  MiraLax is a solution that will increase the amount of water in your bowels to assist with bowel movements.  Take as directed and can mix with a glass of water, juice, soda, coffee, or tea.  Take if you go more than two days without a movement. Do not use MiraLax more than once per day. Call your doctor if you are still constipated or irregular after using this medication for 7 days in a row.  If you continue to have problems with postoperative constipation, please contact the office for further assistance  and recommendations.  If you experience "the worst abdominal pain ever" or develop nausea or vomiting, please contact the office immediatly for further recommendations for treatment.     Do not put a pillow under the knee. Place it under the heel.    Complete by:  As directed      Do not sit on low chairs, stoools or toilet seats, as it may be difficult to get up from low surfaces    Complete by:  As directed      Driving restrictions    Complete by:  As directed   No driving until released by the physician.     Increase activity slowly as tolerated    Complete by:  As directed      Lifting restrictions    Complete by:  As directed   No lifting until released by the physician.     Patient may shower    Complete by:  As directed  You may shower without a dressing once there is no drainage.  Do not wash over the wound.  If drainage remains, do not shower until drainage stops.     TED hose    Complete by:  As directed   Use stockings (TED hose) for 3 weeks on both leg(s).  You may remove them at night for sleeping.     Weight bearing as tolerated    Complete by:  As directed   Laterality:  left  Extremity:  Lower            Medication List    STOP taking these medications        diclofenac sodium 1 % Gel  Commonly known as:  VOLTAREN     HYDROcodone-acetaminophen 5-325 MG per tablet  Commonly known as:  NORCO/VICODIN     ibuprofen 200 MG tablet  Commonly known as:  ADVIL,MOTRIN     Vitamin D 2000 UNITS Caps      TAKE these medications        acetaminophen 500 MG tablet  Commonly known as:  TYLENOL  Take 1,000 mg by mouth every 6 (six) hours as needed for moderate pain.     clonazePAM 1 MG tablet  Commonly known as:  KLONOPIN  Take 1 tablet (1 mg total) by mouth 2 (two) times daily.     diazepam 5 MG tablet  Commonly known as:  VALIUM  Take 5 mg by mouth every 6 (six) hours as needed for anxiety.     divalproex 250 MG DR tablet  Commonly known as:  DEPAKOTE    Take 1 tablet (250 mg total) by mouth Nightly.     feeding supplement (GLUCERNA SHAKE) Liqd  Take 237 mLs by mouth 2 (two) times daily between meals.     glimepiride 1 MG tablet  Commonly known as:  AMARYL  Take 1 mg by mouth daily as needed (if blood sugar is above 150).     LYRICA 50 MG capsule  Generic drug:  pregabalin  Take 1 capsule by mouth at bedtime.     Melatonin 3 MG Tabs  Date melatonin 1 hour before intended bedtime with fluids. Melatonin usually works best if taken after you eliminated  light sources.     methocarbamol 500 MG tablet  Commonly known as:  ROBAXIN  Take 1 tablet (500 mg total) by mouth every 6 (six) hours as needed for muscle spasms.     oxyCODONE 5 MG immediate release tablet  Commonly known as:  Oxy IR/ROXICODONE  Take 1-2 tablets (5-10 mg total) by mouth every 3 (three) hours as needed for moderate pain or severe pain.     pantoprazole 40 MG tablet  Commonly known as:  PROTONIX  Take 1 tablet (40 mg total) by mouth daily.     predniSONE 5 MG tablet  Commonly known as:  DELTASONE  Take 5 mg by mouth daily.     rivaroxaban 10 MG Tabs tablet  Commonly known as:  XARELTO  Take 1 tablet (10 mg total) by mouth daily with breakfast. Take Xarelto for two and a half more weeks, then discontinue Xarelto. Once the patient has completed the blood thinner regimen, then take a Baby 81 mg Aspirin daily for three more weeks.     traMADol 50 MG tablet  Commonly known as:  ULTRAM  Take 1-2 tablets (50-100 mg total) by mouth every 6 (six) hours as needed (mild pain).     venlafaxine XR 75  MG 24 hr capsule  Commonly known as:  EFFEXOR-XR  Take 75 mg by mouth daily with breakfast.     venlafaxine 37.5 MG tablet  Commonly known as:  EFFEXOR  Take 112.5 mg by mouth at bedtime.     venlafaxine XR 150 MG 24 hr capsule  Commonly known as:  EFFEXOR XR  Take 1 capsule (150 mg total) by mouth 2 (two) times daily.           Follow-up Information    Follow  up with [name], MD On 06/07/2015.   Specialty:  Orthopedic Surgery   Why:  Call office at [phone] to setup appointment on 06/07/2015 with Dr. Wynelle Link.   Contact information:   [address] [place] [address] [phone]       Signed: Arlee Muslim, PA-C Orthopaedic Surgery 05/29/2015, 11:10 PM

## 2015-05-29 NOTE — Evaluation (Signed)
Physical Therapy Evaluation Patient Details Name: Jared Bridges MRN: [id number] DOB: 20-May-1930 Today's Date: 05/29/2015   History of Present Illness  s/p L TKA  Clinical Impression  Pt admitted with above diagnosis. Pt currently with functional limitations due to the deficits listed below (see PT Problem List).  Pt will benefit from skilled PT to increase their independence and safety with mobility to allow discharge to the venue listed below.  Planning for home with HHPT, family support; pt would like to get able to go upstairs and then plans to stay on second level     Follow Up Recommendations Home health PT    Equipment Recommendations  None recommended by PT    Recommendations for Other Services       Precautions / Restrictions Precautions Precautions: Knee Required Braces or Orthoses: Knee Immobilizer - Left Knee Immobilizer - Left: Discontinue once straight leg raise with < 10 degree lag Restrictions Other Position/Activity Restrictions: WBAT      Mobility  Bed Mobility Overal bed mobility: Needs Assistance Bed Mobility: Sit to Supine       Sit to supine: Min assist   General bed mobility comments: assist for  LLE  Transfers Overall transfer level: Needs assistance Equipment used: Rolling walker (2 wheeled) Transfers: Sit to/from Stand Sit to Stand: Min assist         General transfer comment: cues for hand placement and safety  Ambulation/Gait Ambulation/Gait assistance: Min assist Ambulation Distance (Feet): 90 Feet Assistive device: Rolling walker (2 wheeled) Gait Pattern/deviations: Step-to pattern;Antalgic;Decreased stride length     General Gait Details: cues for sequence and  RW position from self  Stairs            Wheelchair Mobility    Modified Rankin (Stroke Patients Only)       Balance                                             Pertinent Vitals/Pain Pain Assessment: 0-10 Pain Score: 3  Pain  Location: L knee Pain Descriptors / Indicators: Aching;Sore Pain Intervention(s): Limited activity within patient's tolerance;Monitored during session;Repositioned;Premedicated before session    [place] expects to be discharged to:: Private residence Living Arrangements: Spouse/significant other Available Help at Discharge: Family Type of Home: House Home Access: Stairs to enter Entrance Stairs-Rails: None Entrance Stairs-Number of Steps: 2 Home Layout: Two level [place]: Cane - quad;Grab bars - toilet;Grab bars - tub/shower;Tub bench;Walker - 2 wheels;Walker - standard Additional Comments: Tub Shower, Handicap height toilet    Prior Function Level of Independence: Independent with assistive device(s)               Hand Dominance        Extremity/Trunk Assessment   Upper Extremity Assessment: Defer to OT evaluation           Lower Extremity Assessment: LLE deficits/detail   LLE Deficits / Details: knee extension and hip flexion 2+/5;  knee AAROM 5 to 60*     Communication   Communication: No difficulties  Cognition Arousal/Alertness: Awake/alert Behavior During Therapy: WFL for tasks assessed/performed Overall Cognitive Status: Within Functional Limits for tasks assessed                      General Comments      Exercises Total Joint Exercises Ankle Circles/Pumps: AROM;Both;10 reps Quad Sets:  AROM;Both;10 reps Heel Slides: AAROM;10 reps;Left;AROM Hip ABduction/ADduction: 10 reps;Left;AAROM Straight Leg Raises: AAROM;Left;10 reps      Assessment/Plan    PT Assessment Patient needs continued PT services  PT Diagnosis Difficulty walking   PT Problem List Decreased strength;Decreased range of motion;Decreased activity tolerance;Decreased mobility;Decreased knowledge of use of DME  PT Treatment Interventions DME instruction;Gait training;Functional mobility training;Therapeutic activities;Therapeutic  exercise;Patient/family education   PT Goals (Current goals can be found in the Care Plan section) Acute Rehab PT Goals Patient Stated Goal: regain independence PT Goal Formulation: With patient Time For Goal Achievement: 06/05/15 Potential to Achieve Goals: Good    Frequency 7X/week   Barriers to discharge        Co-evaluation               End of Session Equipment Utilized During Treatment: Gait belt Activity Tolerance: Patient tolerated treatment well Patient left: in bed;with call bell/phone within reach Nurse Communication: Mobility status         Time: 0950-1016 PT Time Calculation (min) (ACUTE ONLY): 26 min   Charges:   PT Evaluation $Initial PT Evaluation Tier I: 1 Procedure PT Treatments $Gait Training: 8-22 mins   PT G Codes:        Devlon Dosher Jun 03, 2015, 1:16 PM

## 2015-05-29 NOTE — Discharge Instructions (Addendum)
° °Dr. Frank Aluisio °Total Joint Specialist °[facility] °[address] °[place], [place] [address] °[phone] ° °TOTAL KNEE REPLACEMENT POSTOPERATIVE DIRECTIONS ° °Knee Rehabilitation, Guidelines Following Surgery  °Results after knee surgery are often greatly improved when you follow the exercise, range of motion and muscle strengthening exercises prescribed by your doctor. Safety measures are also important to protect the knee from further injury. Any time any of these exercises cause you to have increased pain or swelling in your knee joint, decrease the amount until you are comfortable again and slowly increase them. If you have problems or questions, call your caregiver or physical therapist for advice.  ° °HOME CARE INSTRUCTIONS  °Remove items at home which could result in a fall. This includes throw rugs or furniture in walking pathways.  °· ICE to the affected knee every three hours for 30 minutes at a time and then as needed for pain and swelling.  Continue to use ice on the knee for pain and swelling from surgery. You may notice swelling that will progress down to the foot and ankle.  This is normal after surgery.  Elevate the leg when you are not up walking on it.   °· Continue to use the breathing machine which will help keep your temperature down.  It is common for your temperature to cycle up and down following surgery, especially at night when you are not up moving around and exerting yourself.  The breathing machine keeps your lungs expanded and your temperature down. °· Do not place pillow under knee, focus on keeping the knee straight while resting ° °DIET °You may resume your previous home diet once your are discharged from the hospital. ° °DRESSING / WOUND CARE / SHOWERING °You may shower 3 days after surgery, but keep the wounds dry during showering.  You may use an occlusive plastic wrap (Press'n Seal for example), NO SOAKING/SUBMERGING IN THE BATHTUB.  If the  bandage gets wet, change with a clean dry gauze.  If the incision gets wet, pat the wound dry with a clean towel. °You may start showering once you are discharged home but do not submerge the incision under water. Just pat the incision dry and apply a dry gauze dressing on daily. °Change the surgical dressing daily and reapply a dry dressing each time. ° °ACTIVITY °Walk with your walker as instructed. °Use walker as long as suggested by your caregivers. °Avoid periods of inactivity such as sitting longer than an hour when not asleep. This helps prevent blood clots.  °You may resume a sexual relationship in one month or when given the OK by your doctor.  °You may return to work once you are cleared by your doctor.  °Do not drive a car for 6 weeks or until released by you surgeon.  °Do not drive while taking narcotics. ° °WEIGHT BEARING °Weight bearing as tolerated with assist device (walker, cane, etc) as directed, use it as long as suggested by your surgeon or therapist, typically at least 4-6 weeks. ° °POSTOPERATIVE CONSTIPATION PROTOCOL °Constipation - defined medically as fewer than three stools per week and severe constipation as less than one stool per week. ° °One of the most common issues patients have following surgery is constipation.  Even if you have a regular bowel pattern at home, your normal regimen is likely to be disrupted due to multiple reasons following surgery.  Combination of anesthesia, postoperative narcotics, change in appetite and fluid intake all can affect your bowels.    In order to avoid complications following surgery, here are some recommendations in order to help you during your recovery period. ° °Colace (docusate) - Pick up an over-the-counter form of Colace or another stool softener and take twice a day as long as you are requiring postoperative pain medications.  Take with a full glass of water daily.  If you experience loose stools or diarrhea, hold the colace until you stool forms  back up.  If your symptoms do not get better within 1 week or if they get worse, check with your doctor. ° °Dulcolax (bisacodyl) - Pick up over-the-counter and take as directed by the product packaging as needed to assist with the movement of your bowels.  Take with a full glass of water.  Use this product as needed if not relieved by Colace only.  ° °MiraLax (polyethylene glycol) - Pick up over-the-counter to have on hand.  MiraLax is a solution that will increase the amount of water in your bowels to assist with bowel movements.  Take as directed and can mix with a glass of water, juice, soda, coffee, or tea.  Take if you go more than two days without a movement. °Do not use MiraLax more than once per day. Call your doctor if you are still constipated or irregular after using this medication for 7 days in a row. ° °If you continue to have problems with postoperative constipation, please contact the office for further assistance and recommendations.  If you experience "the worst abdominal pain ever" or develop nausea or vomiting, please contact the office immediatly for further recommendations for treatment. ° °ITCHING ° If you experience itching with your medications, try taking only a single pain pill, or even half a pain pill at a time.  You can also use Benadryl over the counter for itching or also to help with sleep.  ° °TED HOSE STOCKINGS °Wear the elastic stockings on both legs for three weeks following surgery during the day but you may remove then at night for sleeping. ° °MEDICATIONS °See your medication summary on the “After Visit Summary” that the nursing staff will review with you prior to discharge.  You may have some home medications which will be placed on hold until you complete the course of blood thinner medication.  It is important for you to complete the blood thinner medication as prescribed by your surgeon.  Continue your approved medications as instructed at time of  discharge. ° °PRECAUTIONS °If you experience chest pain or shortness of breath - call 911 immediately for transfer to the hospital emergency department.  °If you develop a fever greater that 101 F, purulent drainage from wound, increased redness or drainage from wound, foul odor from the wound/dressing, or calf pain - CONTACT YOUR SURGEON.   °                                                °FOLLOW-UP APPOINTMENTS °Make sure you keep all of your appointments after your operation with your surgeon and caregivers. You should call the office at the above phone number and make an appointment for approximately two weeks after the date of your surgery or on the date instructed by your surgeon outlined in the "After Visit Summary". ° ° °RANGE OF MOTION AND STRENGTHENING EXERCISES  °Rehabilitation of the knee is important following a knee injury or   an operation. After just a few days of immobilization, the muscles of the thigh which control the knee become weakened and shrink (atrophy). Knee exercises are designed to build up the tone and strength of the thigh muscles and to improve knee motion. Often times heat used for twenty to thirty minutes before working out will loosen up your tissues and help with improving the range of motion but do not use heat for the first two weeks following surgery. These exercises can be done on a training (exercise) mat, on the floor, on a table or on a bed. Use what ever works the best and is most comfortable for you Knee exercises include:  °Leg Lifts - While your knee is still immobilized in a splint or cast, you can do straight leg raises. Lift the leg to 60 degrees, hold for 3 sec, and slowly lower the leg. Repeat 10-20 times 2-3 times daily. Perform this exercise against resistance later as your knee gets better.  °Quad and Hamstring Sets - Tighten up the muscle on the front of the thigh (Quad) and hold for 5-10 sec. Repeat this 10-20 times hourly. Hamstring sets are done by pushing the  foot backward against an object and holding for 5-10 sec. Repeat as with quad sets.  °· Leg Slides: Lying on your back, slowly slide your foot toward your buttocks, bending your knee up off the floor (only go as far as is comfortable). Then slowly slide your foot back down until your leg is flat on the floor again. °· Angel Wings: Lying on your back spread your legs to the side as far apart as you can without causing discomfort.  °A rehabilitation program following serious knee injuries can speed recovery and prevent re-injury in the future due to weakened muscles. Contact your doctor or a physical therapist for more information on knee rehabilitation.  ° °IF YOU ARE TRANSFERRED TO A SKILLED REHAB FACILITY °If the patient is transferred to a skilled rehab facility following release from the hospital, a list of the current medications will be sent to the facility for the patient to continue.  When discharged from the skilled rehab facility, please have the facility set up the patient's Home Health Physical Therapy prior to being released. Also, the skilled facility will be responsible for providing the patient with their medications at time of release from the facility to include their pain medication, the muscle relaxants, and their blood thinner medication. If the patient is still at the rehab facility at time of the two week follow up appointment, the skilled rehab facility will also need to assist the patient in arranging follow up appointment in our office and any transportation needs. ° °MAKE SURE YOU:  °Understand these instructions.  °Get help right away if you are not doing well or get worse.  ° ° °Pick up stool softner and laxative for home use following surgery while on pain medications. °Do not submerge incision under water. °Please use good hand washing techniques while changing dressing each day. °May shower starting three days after surgery. °Please use a clean towel to pat the incision dry following  showers. °Continue to use ice for pain and swelling after surgery. °Do not use any lotions or creams on the incision until instructed by your surgeon. ° °Take Xarelto for two and a half more weeks, then discontinue Xarelto. °Once the patient has completed the blood thinner regimen, then take a Baby 81 mg Aspirin daily for three more weeks. ° ° °Information   on my medicine - XARELTO (Rivaroxaban)  This medication education was reviewed with me or my healthcare representative as part of my discharge preparation.  The pharmacist that spoke with me during my hospital stay was:  WOFFORD, DREW A, RPH  Why was Xarelto prescribed for you? Xarelto was prescribed for you to reduce the risk of blood clots forming after orthopedic surgery. The medical term for these abnormal blood clots is venous thromboembolism (VTE).  What do you need to know about xarelto ? Take your Xarelto ONCE DAILY at the same time every day. You may take it either with or without food.  If you have difficulty swallowing the tablet whole, you may crush it and mix in applesauce just prior to taking your dose.  Take Xarelto exactly as prescribed by your doctor and DO NOT stop taking Xarelto without talking to the doctor who prescribed the medication.  Stopping without other VTE prevention medication to take the place of Xarelto may increase your risk of developing a clot.  After discharge, you should have regular check-up appointments with your healthcare provider that is prescribing your Xarelto.    What do you do if you miss a dose? If you miss a dose, take it as soon as you remember on the same day then continue your regularly scheduled once daily regimen the next day. Do not take two doses of Xarelto on the same day.   Important Safety Information A possible side effect of Xarelto is bleeding. You should call your healthcare provider right away if you experience any of the following: ? Bleeding from an injury or your  nose that does not stop. ? Unusual colored urine (red or dark brown) or unusual colored stools (red or black). ? Unusual bruising for unknown reasons. ? A serious fall or if you hit your head (even if there is no bleeding).  Some medicines may interact with Xarelto and might increase your risk of bleeding while on Xarelto. To help avoid this, consult your healthcare provider or pharmacist prior to using any new prescription or non-prescription medications, including herbals, vitamins, non-steroidal anti-inflammatory drugs (NSAIDs) and supplements.  This website has more information on Xarelto: [email or web address]

## 2015-05-29 NOTE — Evaluation (Signed)
Occupational Therapy Evaluation Patient Details Name: Jared Bridges MRN: [id number] DOB: 05-May-1930 Today's Date: 05/29/2015    History of Present Illness s/p L TKA   Clinical Impression   Pt up to 3in1 with walker and min assist required. Pt doing well. Will benefit from skilled OT services to progress ADL independence.     Follow Up Recommendations  No OT follow up;Supervision/Assistance - 24 hour    Equipment Recommendations  3 in 1 bedside comode    Recommendations for Other Services       Precautions / Restrictions Precautions Precautions: Knee Precaution Comments: did SLR    Weight Bearing Restrictions: No Other Position/Activity Restrictions: WBAT      Mobility Bed Mobility Overal bed mobility: Needs Assistance Bed Mobility: Supine to Sit     Supine to sit: Min guard Sit to supine: Min assist   General bed mobility comments: assist for  LLE  Transfers Overall transfer level: Needs assistance Equipment used: Rolling walker (2 wheeled) Transfers: Sit to/from Stand Sit to Stand: Min assist         General transfer comment: cues for hand placement and LE management    Balance                                            ADL Overall ADL's : Needs assistance/impaired Eating/Feeding: Independent;Sitting   Grooming: Wash/dry hands;Set up;Sitting   Upper Body Bathing: Set up;Sitting   Lower Body Bathing: Moderate assistance;Sit to/from stand   Upper Body Dressing : Set up;Sitting   Lower Body Dressing: Moderate assistance;Sit to/from stand   Toilet Transfer: Minimal assistance;Ambulation;BSC;RW   Toileting- Clothing Manipulation and Hygiene: Minimal assistance;Sit to/from stand;Moderate assistance         General ADL Comments: Educated on AE options but pt states he can have family assist with LB self care. Pt has a tub transfer bench and would like to practice using. Cues for hand placement with functional transfers.       Vision     Perception     Praxis      Pertinent Vitals/Pain Pain Assessment: 0-10 Pain Score: 4  Pain Location: L  Pain Descriptors / Indicators: Aching Pain Intervention(s): Repositioned;Ice applied     Hand Dominance     Extremity/Trunk Assessment Upper Extremity Assessment Upper Extremity Assessment: Overall WFL for tasks assessed          Communication Communication Communication: No difficulties   Cognition Arousal/Alertness: Awake/alert Behavior During Therapy: WFL for tasks assessed/performed Overall Cognitive Status: Within Functional Limits for tasks assessed                     General Comments       Exercises       Shoulder Instructions      Home Living Family/patient expects to be discharged to:: Private residence Living Arrangements: Spouse/significant other Available Help at Discharge: Family Type of Home: House Home Access: Stairs to enter Technical brewer of Steps: 2 Entrance Stairs-Rails: None Home Layout: Two level Alternate Level Stairs-Number of Steps: 18 steps Alternate Level Stairs-Rails: Right Bathroom Shower/Tub: Teacher, early years/pre: Handicapped height     Home Equipment: Cane - quad;Grab bars - toilet;Grab bars - tub/shower;Tub bench;Walker - 2 wheels;Walker - standard;Adaptive equipment Adaptive Equipment: Reacher;Sock aid;Long-handled shoe horn;Long-handled sponge Additional Comments: Tub Shower, Handicap height toilet  Prior Functioning/Environment Level of Independence: Independent with assistive device(s)             OT Diagnosis: Generalized weakness   OT Problem List: Decreased strength;Decreased knowledge of use of DME or AE   OT Treatment/Interventions: Self-care/ADL training;Patient/family education;Therapeutic activities;DME and/or AE instruction    OT Goals(Current goals can be found in the care plan section) Acute Rehab OT Goals Patient Stated Goal: regain  independence OT Goal Formulation: With patient Time For Goal Achievement: 06/05/15 Potential to Achieve Goals: Good  OT Frequency: Min 2X/week   Barriers to D/C:            Co-evaluation              End of Session Equipment Utilized During Treatment: Gait belt;Rolling walker  Activity Tolerance: Patient tolerated treatment well Patient left: in chair;with call bell/phone within reach   Time: 1130-1200 OT Time Calculation (min): 30 min Charges:  OT General Charges $OT Visit: 1 Procedure OT Evaluation $Initial OT Evaluation Tier I: 1 Procedure OT Treatments $Therapeutic Activity: 8-22 mins G-Codes:    [name]  [phone] 05/29/2015, 1:31 PM

## 2015-05-30 LAB — BASIC METABOLIC PANEL
ANION GAP: 8 (ref 5–15)
BUN: 23 mg/dL — ABNORMAL HIGH (ref 6–20)
CHLORIDE: 100 mmol/L — AB (ref 101–111)
CO2: 26 mmol/L (ref 22–32)
CREATININE: 1.13 mg/dL (ref 0.61–1.24)
Calcium: 8.2 mg/dL — ABNORMAL LOW (ref 8.9–10.3)
GFR calc Af Amer: >60 mL/min (ref >60)
GFR calc non Af Amer: 59 mL/min — ABNORMAL LOW (ref >60)
Glucose, Bld: 210 mg/dL — ABNORMAL HIGH (ref 65–99)
POTASSIUM: 4.2 mmol/L (ref 3.5–5.1)
Sodium: 134 mmol/L — ABNORMAL LOW (ref 135–145)

## 2015-05-30 LAB — GLUCOSE, CAPILLARY: Glucose-Capillary: 180 mg/dL — ABNORMAL HIGH (ref 65–99)

## 2015-05-30 LAB — CBC
HCT: 30.3 % — ABNORMAL LOW (ref 39.0–52.0)
Hemoglobin: 10 g/dL — ABNORMAL LOW (ref 13.0–17.0)
MCH: 32.3 pg (ref 26.0–34.0)
MCHC: 33 g/dL (ref 30.0–36.0)
MCV: 97.7 fL (ref 78.0–100.0)
PLATELETS: 204 10*3/uL (ref 150–400)
RBC: 3.1 MIL/uL — AB (ref 4.22–5.81)
RDW: 13.6 % (ref 11.5–15.5)
WBC: 18.9 10*3/uL — ABNORMAL HIGH (ref 4.0–10.5)

## 2015-05-30 MED ORDER — BUPIVACAINE IN DEXTROSE 0.75-8.25 % IT SOLN
INTRATHECAL | Status: DC | PRN
  Administered 2015-05-28: 2 mL via INTRATHECAL

## 2015-05-30 NOTE — Progress Notes (Signed)
Physical Therapy Treatment Patient Details Name: Jared Bridges MRN: [id number] DOB: 10/25/1930 Today's Date: 05/30/2015    History of Present Illness s/p L TKA    PT Comments    POD # 2 pm session family education with brother on stairs, safe handling during amb and KI use.   Pt ready for D/C to home.  Advised brother to not allow pt to amb by self.  Hands on assist due to unsteadiness.    Follow Up Recommendations  Home health PT     Equipment Recommendations  None recommended by PT    Recommendations for Other Services       Precautions / Restrictions Precautions Precautions: Knee Precaution Comments: instructed on KI use for amb and stairs Required Braces or Orthoses: Knee Immobilizer - Left Restrictions Weight Bearing Restrictions: No Other Position/Activity Restrictions: WBAT    Mobility  Bed Mobility Overal bed mobility: Needs Assistance Bed Mobility: Supine to Sit     Supine to sit: Min guard     General bed mobility comments: Pt OOB in recliner  Transfers Overall transfer level: Needs assistance Equipment used: Rolling walker (2 wheeled)   Sit to Stand: Min guard;Min assist         General transfer comment: 50% VC's on safety with turns and hand placement  Ambulation/Gait Ambulation/Gait assistance: Min guard;Min assist Ambulation Distance (Feet): 75 Feet Assistive device: Rolling walker (2 wheeled) Gait Pattern/deviations: Step-to pattern;Decreased stance time - left;Trunk flexed;Narrow base of support Gait velocity: decreased   General Gait Details: cues for sequence and  RW position from self.  slightly unsteady.   Stairs Stairs: Yes Stairs assistance: Min assist Stair Management: No rails;Step to pattern;Backwards;With walker Number of Stairs: 2 General stair comments: with Brother practiced 2 steps backward with walker due to no rails. Performed twice and handout also given.  Wheelchair Mobility    Modified Rankin (Stroke  Patients Only)       Balance                                    Cognition Arousal/Alertness: Awake/alert Behavior During Therapy: WFL for tasks assessed/performed Overall Cognitive Status: Within Functional Limits for tasks assessed                      Exercises      General Comments        Pertinent Vitals/Pain Pain Assessment: 0-10 Pain Score: 6  Pain Location: L knee Pain Descriptors / Indicators: Constant;Sore Pain Intervention(s): Monitored during session;Repositioned;Ice applied    Home Living                      Prior Function            PT Goals (current goals can now be found in the care plan section) Progress towards PT goals: Progressing toward goals    Frequency  7X/week    PT Plan Current plan remains appropriate    Co-evaluation             End of Session Equipment Utilized During Treatment: Gait belt;Left knee immobilizer Activity Tolerance: Patient tolerated treatment well Patient left: in chair;with call bell/phone within reach     Time: 1110-1136 PT Time Calculation (min) (ACUTE ONLY): 26 min  Charges:  $Gait Training: 8-22 mins $Therapeutic Activity: 8-22 mins  G Codes:      Rica Koyanagi  PTA [facility]  Acute  Rehab Pager      [phone]

## 2015-05-30 NOTE — Progress Notes (Deleted)
Readmission entered on incorrect patient

## 2015-05-30 NOTE — Addendum Note (Signed)
Addendum  created 05/30/15 8127 by Lissa Morales, CRNA   Modules edited: Anesthesia Medication Administration

## 2015-05-30 NOTE — Care Management (Signed)
Important Message  Patient Details  Name: Jared Bridges MRN: [id number] Date of Birth: 08-29-1930   Medicare Important Message Given:  Yes-second notification given    Camillo Flaming, LCSW 05/30/2015, 11:19 AM

## 2015-05-30 NOTE — Progress Notes (Signed)
   Subjective: 2 Days Post-Op Procedure(s) (LRB): LEFT TOTAL KNEE ARTHROPLASTY (Left) Patient reports pain as mild.   Patient seen in rounds by Dr. Wynelle Link. Patient is well, but has had some minor complaints of pain in the knee, requiring pain medications Patient is ready to go home  Objective: Vital signs in last 24 hours: Temp:  [97.4 F (36.3 C)-98.6 F (37 C)] 98.5 F (36.9 C) (06/29 0515) Pulse Rate:  [70-100] 82 (06/29 0515) Resp:  [18-20] 20 (06/29 0515) BP: (122-136)/(50-58) 122/58 mmHg (06/29 0515) SpO2:  [93 %-97 %] 93 % (06/29 0515)  Intake/Output from previous day:  Intake/Output Summary (Last 24 hours) at 05/30/15 0708 Last data filed at 05/30/15 0515  Gross per 24 hour  Intake   1484 ml  Output   1000 ml  Net    484 ml   Labs:  Recent Labs  05/29/15 0410 05/30/15 0449  HGB 11.4* 10.0*    Recent Labs  05/29/15 0410 05/30/15 0449  WBC 23.1* 18.9*  RBC 3.70* 3.10*  HCT 35.5* 30.3*  PLT 182 204    Recent Labs  05/29/15 0410 05/30/15 0449  NA 136 134*  K 4.4 4.2  CL 102 100*  CO2 28 26  BUN 20 23*  CREATININE 0.96 1.13  GLUCOSE 196* 210*  CALCIUM 8.1* 8.2*   No results for input(s): LABPT, INR in the last 72 hours.  EXAM: General - Patient is Alert, Appropriate and Oriented Extremity - Neurovascular intact Sensation intact distally Dorsiflexion/Plantar flexion intact Incision - clean, dry, no drainage Motor Function - intact, moving foot and toes well on exam.   Assessment/Plan: 2 Days Post-Op Procedure(s) (LRB): LEFT TOTAL KNEE ARTHROPLASTY (Left) Procedure(s) (LRB): LEFT TOTAL KNEE ARTHROPLASTY (Left) Past Medical History  Diagnosis Date  . Chronic headaches   . Type 2 diabetes mellitus   . Essential hypertension   . Diverticulitis   . Syncope   . Hypercholesteremia   . GI bleed     Dr. Laural Golden - 1998  . Peptic ulcer disease   . Anemia, iron deficiency   . Depression   . Chronic kidney disease   . Peripheral  neuropathy    Principal Problem:   OA (osteoarthritis) of knee  Estimated body mass index is 28.62 kg/(m^2) as calculated from the following:   Height as of this encounter: 5' 11.65" (1.82 m).   Weight as of this encounter: 94.802 kg (209 lb). Up with therapy Discharge home with home health Diet - Cardiac diet and Diabetic diet Follow up - in 2 weeks Activity - WBAT Disposition - Home Condition Upon Discharge - Good D/C Meds - See DC Summary DVT Prophylaxis - Xarelto  Arlee Muslim, PA-C Orthopaedic Surgery 05/30/2015, 7:08 AM

## 2015-05-30 NOTE — Progress Notes (Signed)
Occupational Therapy Treatment Patient Details Name: Jared Bridges MRN: [id number] DOB: November 24, 1930 Today's Date: 05/30/2015    History of present illness s/p L TKA   OT comments  Recommend HHOT follow for safety as pt is slow to process commands at time. No family present to provide education so feel HHOT would be helpful. Recommended pt not shower until [facility] practice tub/shower again with him and pt is agreeable to this. Pt to d/c today.   Follow Up Recommendations  Home health OT;Supervision/Assistance - 24 hour    Equipment Recommendations  3 in 1 bedside comode    Recommendations for Other Services      Precautions / Restrictions Precautions Precautions: Knee Required Braces or Orthoses: Knee Immobilizer - Left Knee Immobilizer - Left: Discontinue once straight leg raise with < 10 degree lag Restrictions Weight Bearing Restrictions: No Other Position/Activity Restrictions: WBAT       Mobility Bed Mobility               General bed mobility comments: in chair.  Transfers Overall transfer level: Needs assistance Equipment used: Rolling walker (2 wheeled) Transfers: Sit to/from Stand Sit to Stand: Min guard;Min assist         General transfer comment: cues for hand placement and LE management.    Balance                                   ADL                                   Tub/ Shower Transfer: Tub transfer;Minimal assistance;Rolling walker;Tub bench     General ADL Comments: Pt already dressed when OT arrived. did practice with AE for LB self care. Pt donned sock with min assist to fully get sock over heel. Pt doffed sock with supervision. reviewed use of reacher to don shorts and sequence for LB dressing. Pt didnt feel he can push up on edge of tubbench as there isnt much room for hand to hold on edge of bench. So emphasized that pt MUST have wife steady walker if he is going to pull up on walker to stand up and only  allow this for tubbench transfer and not all of his transfers. Feel pt will benefit from additional practice with tub transfer with [place] and for pt to sponge bathe initially. Pt agreeable to plan.       Vision                     Perception     Praxis      Cognition   Behavior During Therapy: WFL for tasks assessed/performed Overall Cognitive Status: No family/caregiver present to determine baseline cognitive functioning       Memory:  (pt slow to process some commands.)               Extremity/Trunk Assessment               Exercises     Shoulder Instructions       General Comments      Pertinent Vitals/ Pain       Pain Assessment: 0-10 Pain Score: 4  Pain Location: L knee Pain Descriptors / Indicators: Aching Pain Intervention(s): Repositioned;Ice applied  Home Living  Prior Functioning/Environment              Frequency Min 2X/week     Progress Toward Goals  OT Goals(current goals can now be found in the care plan section)  Progress towards OT goals: Progressing toward goals     Plan Discharge plan needs to be updated    Co-evaluation                 End of Session Equipment Utilized During Treatment: Rolling walker;Left knee immobilizer   Activity Tolerance Patient tolerated treatment well   Patient Left in chair;with call bell/phone within reach   Nurse Communication          Time: 4388-8757 OT Time Calculation (min): 20 min  Charges: OT General Charges $OT Visit: 1 Procedure OT Treatments $Therapeutic Activity: 8-22 mins  [name]  [phone] 05/30/2015, 12:08 PM

## 2015-05-30 NOTE — Progress Notes (Signed)
OT recc 3n1 and CM confirms with pt; CM called [facility] DME rep, Lecretia to please deliver a 3n1 to room prior to discharge today.  No other CM needs were communicated.

## 2015-05-30 NOTE — Progress Notes (Signed)
Physical Therapy Treatment Patient Details Name: Jared Bridges MRN: [id number] DOB: May 13, 1930 Today's Date: 05/30/2015    History of Present Illness s/p L TKA    PT Comments    POD # 2 applied KI and instructed on use for amb and stairs.  Assisted with amb in hallway.  Slightly unsteady and required increased VC's for safety and sequencing.  Returned to room then performed TKR TE's followed by ICE.  Will need to see pt again with family to practice stairs and perform family education.   Follow Up Recommendations  Home health PT     Equipment Recommendations  None recommended by PT    Recommendations for Other Services       Precautions / Restrictions Precautions Precautions: Knee Precaution Comments: instructed on KI use for amb and stairs Required Braces or Orthoses: Knee Immobilizer - Left Restrictions Weight Bearing Restrictions: No Other Position/Activity Restrictions: WBAT    Mobility  Bed Mobility Overal bed mobility: Needs Assistance Bed Mobility: Supine to Sit     Supine to sit: Min guard     General bed mobility comments: assist for L LE and increased time  Transfers Overall transfer level: Needs assistance Equipment used: Rolling walker (2 wheeled) Transfers: Sit to/from Stand Sit to Stand: Min guard;Min assist         General transfer comment: 50% VC's on safety with turns and hand placement  Ambulation/Gait Ambulation/Gait assistance: Min guard;Min assist Ambulation Distance (Feet): 75 Feet Assistive device: Rolling walker (2 wheeled) Gait Pattern/deviations: Step-to pattern;Decreased stance time - left;Trunk flexed;Narrow base of support Gait velocity: decreased   General Gait Details: cues for sequence and  RW position from self.  slightly unsteady.   Stairs            Wheelchair Mobility    Modified Rankin (Stroke Patients Only)       Balance                                    Cognition  Arousal/Alertness: Awake/alert Behavior During Therapy: WFL for tasks assessed/performed Overall Cognitive Status: Within Functional Limits for tasks assessed       Memory:  (pt slow to process some commands.)              Exercises   Total Knee Replacement TE's 10 reps B LE ankle pumps 10 reps towel squeezes 10 reps knee presses 10 reps heel slides  10 reps SAQ's 10 reps SLR's 10 reps ABD Followed by ICE     General Comments        Pertinent Vitals/Pain Pain Assessment: 0-10 Pain Score: 6  Pain Location: L knee Pain Descriptors / Indicators: Constant;Sore Pain Intervention(s): Monitored during session;Repositioned;Ice applied    Home Living                      Prior Function            PT Goals (current goals can now be found in the care plan section) Progress towards PT goals: Progressing toward goals    Frequency  7X/week    PT Plan Current plan remains appropriate    Co-evaluation             End of Session Equipment Utilized During Treatment: Gait belt;Left knee immobilizer Activity Tolerance: Patient tolerated treatment well Patient left: in chair;with call bell/phone within reach     Time: 0910-0940  PT Time Calculation (min) (ACUTE ONLY): 30 min  Charges:  $Gait Training: 8-22 mins $Therapeutic Activity: 8-22 mins                    G Codes:      Rica Koyanagi  PTA [facility]  Acute  Rehab Pager      [phone]

## 2015-05-30 NOTE — Progress Notes (Signed)
Pt d/c'd to home with home health services in place.  Patient educated about medication, s/sx of infection, and dressing changes.  Paperwork and prescriptions given to patient.  Pt verbalizes understanding.

## 2015-05-30 NOTE — Care Management Note (Signed)
Case Management Note  Patient Details  Name: Jared Bridges MRN: [id number] Date of Birth: [birth year]  Subjective/Objective:                   LEFT TOTAL KNEE ARTHROPLASTY (Left) Action/Plan:  Discharge planning Expected Discharge Date:  05/30/15               Expected Discharge Plan:  [place]  In-House Referral:     Discharge planning Services  CM Consult  Post Acute Care Choice:  Home Health Choice offered to:  Patient  DME Arranged:  3-N-1 DME Agency:  [place]:  PT, OT [place] Agency:  [place]  Status of Service:  Completed, signed off  Medicare Important Message Given:  Yes-second notification given Date Medicare IM Given:    Medicare IM give by:    Date Additional Medicare IM Given:    Additional Medicare Important Message give by:     If discussed at [place] of Stay Meetings, dates discussed:    Additional Comments: CM met with pt in room to offer choice of home health agency.  Pt chooses [facility] to render HHPT/OT.  Address and contact information verified by pt.  Referral called to [facility] rep, Kristen.  CM called [facility] DME rep, Lecretia to please deliver the 3n1 to room prior to discharge.  No other CM needs were communicated. Dellie Catholic, RN 05/30/2015, 3:05 PM

## 2015-06-05 ENCOUNTER — Ambulatory Visit (HOSPITAL_COMMUNITY): Payer: Self-pay | Admitting: Psychiatry

## 2015-06-29 ENCOUNTER — Inpatient Hospital Stay (HOSPITAL_COMMUNITY): Payer: Medicare Other

## 2015-06-29 ENCOUNTER — Inpatient Hospital Stay (HOSPITAL_COMMUNITY)
Admission: EM | Admit: 2015-06-29 | Discharge: 2015-06-30 | DRG: 863 | Disposition: A | Discharge status: Home / Self Care | Payer: Medicare Other | Attending: Internal Medicine | Admitting: Internal Medicine

## 2015-06-29 ENCOUNTER — Emergency Department (HOSPITAL_COMMUNITY): Payer: Medicare Other

## 2015-06-29 ENCOUNTER — Encounter (HOSPITAL_COMMUNITY): Payer: Self-pay | Admitting: *Deleted

## 2015-06-29 DIAGNOSIS — I10 Essential (primary) hypertension: Secondary | ICD-10-CM | POA: Diagnosis present

## 2015-06-29 DIAGNOSIS — Z8249 Family history of ischemic heart disease and other diseases of the circulatory system: Secondary | ICD-10-CM | POA: Diagnosis not present

## 2015-06-29 DIAGNOSIS — L03116 Cellulitis of left lower limb: Secondary | ICD-10-CM | POA: Diagnosis present

## 2015-06-29 DIAGNOSIS — E780 Pure hypercholesterolemia: Secondary | ICD-10-CM | POA: Diagnosis present

## 2015-06-29 DIAGNOSIS — IMO0002 Reserved for concepts with insufficient information to code with codable children: Secondary | ICD-10-CM

## 2015-06-29 DIAGNOSIS — T8149XA Infection following a procedure, other surgical site, initial encounter: Secondary | ICD-10-CM

## 2015-06-29 DIAGNOSIS — M1710 Unilateral primary osteoarthritis, unspecified knee: Secondary | ICD-10-CM | POA: Diagnosis present

## 2015-06-29 DIAGNOSIS — M25562 Pain in left knee: Secondary | ICD-10-CM | POA: Diagnosis not present

## 2015-06-29 DIAGNOSIS — D509 Iron deficiency anemia, unspecified: Secondary | ICD-10-CM | POA: Diagnosis present

## 2015-06-29 DIAGNOSIS — T814XXA Infection following a procedure, initial encounter: Secondary | ICD-10-CM | POA: Diagnosis not present

## 2015-06-29 DIAGNOSIS — A419 Sepsis, unspecified organism: Secondary | ICD-10-CM

## 2015-06-29 DIAGNOSIS — N189 Chronic kidney disease, unspecified: Secondary | ICD-10-CM | POA: Diagnosis present

## 2015-06-29 DIAGNOSIS — M1712 Unilateral primary osteoarthritis, left knee: Secondary | ICD-10-CM | POA: Diagnosis not present

## 2015-06-29 DIAGNOSIS — Y838 Other surgical procedures as the cause of abnormal reaction of the patient, or of later complication, without mention of misadventure at the time of the procedure: Secondary | ICD-10-CM | POA: Diagnosis present

## 2015-06-29 DIAGNOSIS — Z87891 Personal history of nicotine dependence: Secondary | ICD-10-CM | POA: Diagnosis not present

## 2015-06-29 DIAGNOSIS — M179 Osteoarthritis of knee, unspecified: Secondary | ICD-10-CM | POA: Diagnosis present

## 2015-06-29 DIAGNOSIS — E1165 Type 2 diabetes mellitus with hyperglycemia: Secondary | ICD-10-CM

## 2015-06-29 DIAGNOSIS — D500 Iron deficiency anemia secondary to blood loss (chronic): Secondary | ICD-10-CM | POA: Diagnosis present

## 2015-06-29 DIAGNOSIS — Z981 Arthrodesis status: Secondary | ICD-10-CM

## 2015-06-29 DIAGNOSIS — T798XXA Other early complications of trauma, initial encounter: Secondary | ICD-10-CM

## 2015-06-29 DIAGNOSIS — Z96652 Presence of left artificial knee joint: Secondary | ICD-10-CM | POA: Diagnosis present

## 2015-06-29 DIAGNOSIS — I129 Hypertensive chronic kidney disease with stage 1 through stage 4 chronic kidney disease, or unspecified chronic kidney disease: Secondary | ICD-10-CM | POA: Diagnosis present

## 2015-06-29 LAB — PROTIME-INR
INR: 1.1 (ref 0.00–1.49)
INR: 1.13 (ref 0.00–1.49)
PROTHROMBIN TIME: 14.4 s (ref 11.6–15.2)
PROTHROMBIN TIME: 14.6 s (ref 11.6–15.2)

## 2015-06-29 LAB — COMPREHENSIVE METABOLIC PANEL
ALT: 14 U/L — AB (ref 17–63)
AST: 21 U/L (ref 15–41)
Albumin: 3.5 g/dL (ref 3.5–5.0)
Alkaline Phosphatase: 72 U/L (ref 38–126)
Anion gap: 8 (ref 5–15)
BILIRUBIN TOTAL: 0.5 mg/dL (ref 0.3–1.2)
BUN: 23 mg/dL — ABNORMAL HIGH (ref 6–20)
CALCIUM: 8.8 mg/dL — AB (ref 8.9–10.3)
CO2: 27 mmol/L (ref 22–32)
Chloride: 101 mmol/L (ref 101–111)
Creatinine, Ser: 1.04 mg/dL (ref 0.61–1.24)
GFR calc non Af Amer: >60 mL/min (ref >60)
GLUCOSE: 213 mg/dL — AB (ref 65–99)
POTASSIUM: 3.9 mmol/L (ref 3.5–5.1)
Sodium: 136 mmol/L (ref 135–145)
TOTAL PROTEIN: 6.9 g/dL (ref 6.5–8.1)

## 2015-06-29 LAB — SEDIMENTATION RATE: SED RATE: 38 mm/h — AB (ref 0–16)

## 2015-06-29 LAB — TSH: TSH: 0.836 u[IU]/mL (ref 0.350–4.500)

## 2015-06-29 LAB — CBC WITH DIFFERENTIAL/PLATELET
BASOS PCT: 0 % (ref 0–1)
Basophils Absolute: 0 10*3/uL (ref 0.0–0.1)
Eosinophils Absolute: 0.3 10*3/uL (ref 0.0–0.7)
Eosinophils Relative: 3 % (ref 0–5)
HEMATOCRIT: 37 % — AB (ref 39.0–52.0)
Hemoglobin: 11.5 g/dL — ABNORMAL LOW (ref 13.0–17.0)
Lymphocytes Relative: 12 % (ref 12–46)
Lymphs Abs: 1.1 10*3/uL (ref 0.7–4.0)
MCH: 31.1 pg (ref 26.0–34.0)
MCHC: 31.1 g/dL (ref 30.0–36.0)
MCV: 100 fL (ref 78.0–100.0)
Monocytes Absolute: 0.8 10*3/uL (ref 0.1–1.0)
Monocytes Relative: 9 % (ref 3–12)
Neutro Abs: 7 10*3/uL (ref 1.7–7.7)
Neutrophils Relative %: 76 % (ref 43–77)
PLATELETS: 267 10*3/uL (ref 150–400)
RBC: 3.7 MIL/uL — AB (ref 4.22–5.81)
RDW: 14.2 % (ref 11.5–15.5)
WBC: 9.3 10*3/uL (ref 4.0–10.5)

## 2015-06-29 LAB — I-STAT CG4 LACTIC ACID, ED
LACTIC ACID, VENOUS: 3.08 mmol/L — AB (ref 0.5–2.0)
Lactic Acid, Venous: 0.98 mmol/L (ref 0.5–2.0)

## 2015-06-29 LAB — GLUCOSE, CAPILLARY
GLUCOSE-CAPILLARY: 153 mg/dL — AB (ref 65–99)
Glucose-Capillary: 134 mg/dL — ABNORMAL HIGH (ref 65–99)

## 2015-06-29 LAB — PROCALCITONIN: Procalcitonin: <0.1 ng/mL

## 2015-06-29 LAB — LACTIC ACID, PLASMA
LACTIC ACID, VENOUS: 1.6 mmol/L (ref 0.5–2.0)
LACTIC ACID, VENOUS: 1.1 mmol/L (ref 0.5–2.0)

## 2015-06-29 LAB — APTT: APTT: 29 s (ref 24–37)

## 2015-06-29 LAB — C-REACTIVE PROTEIN: CRP: 0.6 mg/dL (ref <1.0)

## 2015-06-29 MED ORDER — INSULIN ASPART 100 UNIT/ML ~~LOC~~ SOLN
0.0000 [IU] | SUBCUTANEOUS | Status: DC
  Administered 2015-06-29: 3 [IU] via SUBCUTANEOUS

## 2015-06-29 MED ORDER — METHOCARBAMOL 500 MG PO TABS: 500.0000 mg | ORAL_TABLET | Freq: Four times a day (QID) | ORAL | Status: DC | PRN

## 2015-06-29 MED ORDER — DICLOFENAC SODIUM 1 % TD GEL
4.0000 g | Freq: Every day | TRANSDERMAL | Status: DC | PRN
  Filled 2015-06-29: qty 100

## 2015-06-29 MED ORDER — ONDANSETRON HCL 4 MG/2ML IJ SOLN: 4.0000 mg | Freq: Four times a day (QID) | INTRAMUSCULAR | Status: DC | PRN

## 2015-06-29 MED ORDER — PIPERACILLIN-TAZOBACTAM 3.375 G IVPB
3.3750 g | Freq: Three times a day (TID) | INTRAVENOUS | Status: DC
  Filled 2015-06-29 (×9): qty 50

## 2015-06-29 MED ORDER — PREGABALIN 50 MG PO CAPS
50.0000 mg | ORAL_CAPSULE | Freq: Every day | ORAL | Status: DC
  Administered 2015-06-29: 50 mg via ORAL
  Filled 2015-06-29: qty 1

## 2015-06-29 MED ORDER — SODIUM CHLORIDE 0.9 % IV SOLN
Freq: Once | INTRAVENOUS | Status: AC
  Administered 2015-06-29: 16:00:00 via INTRAVENOUS

## 2015-06-29 MED ORDER — CLONAZEPAM 0.5 MG PO TABS
1.0000 mg | ORAL_TABLET | Freq: Every day | ORAL | Status: DC
  Administered 2015-06-29: 1 mg via ORAL
  Filled 2015-06-29: qty 2

## 2015-06-29 MED ORDER — VANCOMYCIN HCL IN DEXTROSE 1-5 GM/200ML-% IV SOLN
1000.0000 mg | Freq: Two times a day (BID) | INTRAVENOUS | Status: DC
  Filled 2015-06-29 (×5): qty 200

## 2015-06-29 MED ORDER — VITAMIN B-1 100 MG PO TABS
100.0000 mg | ORAL_TABLET | Freq: Every day | ORAL | Status: DC
  Administered 2015-06-29 – 2015-06-30 (×2): 100 mg via ORAL
  Filled 2015-06-29 (×2): qty 1

## 2015-06-29 MED ORDER — ADULT MULTIVITAMIN W/MINERALS CH
1.0000 | ORAL_TABLET | Freq: Every day | ORAL | Status: DC
  Administered 2015-06-29 – 2015-06-30 (×2): 1 via ORAL
  Filled 2015-06-29 (×2): qty 1

## 2015-06-29 MED ORDER — DIVALPROEX SODIUM 250 MG PO DR TAB
250.0000 mg | DELAYED_RELEASE_TABLET | Freq: Every morning | ORAL | Status: DC
  Administered 2015-06-30: 250 mg via ORAL
  Filled 2015-06-29: qty 1

## 2015-06-29 MED ORDER — PANTOPRAZOLE SODIUM 40 MG PO TBEC
40.0000 mg | DELAYED_RELEASE_TABLET | Freq: Every day | ORAL | Status: DC
  Administered 2015-06-30: 40 mg via ORAL
  Filled 2015-06-29: qty 1

## 2015-06-29 MED ORDER — GLIMEPIRIDE 2 MG PO TABS: 1.0000 mg | ORAL_TABLET | Freq: Every day | ORAL | Status: DC | PRN

## 2015-06-29 MED ORDER — VANCOMYCIN HCL IN DEXTROSE 1-5 GM/200ML-% IV SOLN
INTRAVENOUS | Status: AC
  Filled 2015-06-29: qty 200

## 2015-06-29 MED ORDER — PIPERACILLIN-TAZOBACTAM 3.375 G IVPB
INTRAVENOUS | Status: AC
  Filled 2015-06-29: qty 100

## 2015-06-29 MED ORDER — GLUCERNA SHAKE PO LIQD
237.0000 mL | Freq: Two times a day (BID) | ORAL | Status: DC
  Administered 2015-06-30: 237 mL via ORAL

## 2015-06-29 MED ORDER — SODIUM CHLORIDE 0.9 % IV SOLN
INTRAVENOUS | Status: DC
  Administered 2015-06-29 – 2015-06-30 (×2): via INTRAVENOUS

## 2015-06-29 MED ORDER — HEPARIN SODIUM (PORCINE) 5000 UNIT/ML IJ SOLN
5000.0000 [IU] | Freq: Three times a day (TID) | INTRAMUSCULAR | Status: DC
  Administered 2015-06-29 – 2015-06-30 (×2): 5000 [IU] via SUBCUTANEOUS
  Filled 2015-06-29 (×2): qty 1

## 2015-06-29 MED ORDER — PREDNISONE 10 MG PO TABS
5.0000 mg | ORAL_TABLET | Freq: Every morning | ORAL | Status: DC
  Administered 2015-06-30: 5 mg via ORAL
  Filled 2015-06-29: qty 1

## 2015-06-29 MED ORDER — PIPERACILLIN-TAZOBACTAM 3.375 G IVPB
3.3750 g | Freq: Three times a day (TID) | INTRAVENOUS | Status: DC
  Administered 2015-06-29 – 2015-06-30 (×2): 3.375 g via INTRAVENOUS
  Filled 2015-06-29 (×9): qty 50

## 2015-06-29 MED ORDER — ALUM & MAG HYDROXIDE-SIMETH 200-200-20 MG/5ML PO SUSP
30.0000 mL | Freq: Four times a day (QID) | ORAL | Status: DC | PRN
Start: 2015-06-29 — End: 2015-06-30

## 2015-06-29 MED ORDER — ONDANSETRON HCL 4 MG PO TABS: 4.0000 mg | ORAL_TABLET | Freq: Four times a day (QID) | ORAL | Status: DC | PRN

## 2015-06-29 MED ORDER — ACETAMINOPHEN 325 MG PO TABS: 650.0000 mg | ORAL_TABLET | Freq: Four times a day (QID) | ORAL | Status: DC | PRN

## 2015-06-29 MED ORDER — PIPERACILLIN-TAZOBACTAM 3.375 G IVPB 30 MIN
3.3750 g | Freq: Once | INTRAVENOUS | Status: DC
  Filled 2015-06-29: qty 50

## 2015-06-29 MED ORDER — FOLIC ACID 1 MG PO TABS
1.0000 mg | ORAL_TABLET | Freq: Every day | ORAL | Status: DC
Start: 2015-06-29 — End: 2015-06-30
  Administered 2015-06-29 – 2015-06-30 (×2): 1 mg via ORAL
  Filled 2015-06-29 (×2): qty 1

## 2015-06-29 MED ORDER — VENLAFAXINE HCL ER 75 MG PO CP24
75.0000 mg | ORAL_CAPSULE | Freq: Every day | ORAL | Status: DC
  Administered 2015-06-30: 75 mg via ORAL
  Filled 2015-06-29: qty 1

## 2015-06-29 MED ORDER — VANCOMYCIN HCL 10 G IV SOLR
1500.0000 mg | Freq: Once | INTRAVENOUS | Status: AC
  Administered 2015-06-29: 1500 mg via INTRAVENOUS
  Filled 2015-06-29: qty 1500

## 2015-06-29 MED ORDER — VANCOMYCIN HCL IN DEXTROSE 1-5 GM/200ML-% IV SOLN: 1000.0000 mg | Freq: Once | INTRAVENOUS | Status: DC

## 2015-06-29 MED ORDER — ACETAMINOPHEN 650 MG RE SUPP: 650.0000 mg | Freq: Four times a day (QID) | RECTAL | Status: DC | PRN

## 2015-06-29 MED ORDER — HYDROCODONE-ACETAMINOPHEN 5-325 MG PO TABS
0.5000 | ORAL_TABLET | Freq: Four times a day (QID) | ORAL | Status: DC | PRN
Start: 2015-06-29 — End: 2015-06-30

## 2015-06-29 MED ORDER — VANCOMYCIN HCL IN DEXTROSE 1-5 GM/200ML-% IV SOLN
1000.0000 mg | Freq: Two times a day (BID) | INTRAVENOUS | Status: DC
  Administered 2015-06-30: 1000 mg via INTRAVENOUS
  Filled 2015-06-29 (×5): qty 200

## 2015-06-29 NOTE — ED Provider Notes (Signed)
CSN: [id number]     Arrival date & time 06/29/15  1414 History   First MD Initiated Contact with Patient 06/29/15 1454     Chief Complaint  Patient presents with  . Wound Infection     (Consider location/radiation/quality/duration/timing/severity/associated sxs/prior Treatment) HPI Comments: Patient presents with continued redness and drainage from his left total knee replacement. He had surgery in June 26 by Dr. Maureen Ralphs. Postoperatively he did have a wound infection and was treated with Keflex. He states he's been having clear drainage from the knee every morning soaking 1 ABG pad. He denies any fever, chills, nausea or vomiting. He is no longer on xarelto or aspirin. He denies any chest pain or shortness of breath. He denies any focal weakness, numbness or tingling. He reports Dr. Maureen Ralphs last saw his knee one week ago and said it was doing well. His daughter became concerned today because of persistent drainage from the knee, warmth and erythema and pain with range of motion.  The history is provided by the patient and a relative.    Past Medical History  Diagnosis Date  . Chronic headaches   . Type 2 diabetes mellitus   . Essential hypertension   . Diverticulitis   . Syncope   . Hypercholesteremia   . GI bleed     Dr. Laural Golden - 1998  . Peptic ulcer disease   . Anemia, iron deficiency   . Depression   . Chronic kidney disease   . Peripheral neuropathy    Past Surgical History  Procedure Laterality Date  . Uvulectomy  2004  . Spinal fusion    . Spinal fusion    . Colon surgery      FOR DIVERTICULOSIS  . Tonsillectomy    . Eye surgery      right cataract with lens implant  . Appendectomy      as child  . Hernia repair      1-right inguinal, 3- left inguinal  . Total knee arthroplasty Left 05/28/2015    Procedure: LEFT TOTAL KNEE ARTHROPLASTY;  Surgeon: Gaynelle Arabian, MD;  Location: [facility] ORS;  Service: Orthopedics;  Laterality: Left;   Family History  Problem Relation Age  of Onset  . Hypertension    . Depression Brother   . Depression Daughter    History  Substance Use Topics  . Smoking status: Former Smoker -- 0.10 packs/day    Types: Cigarettes    Start date: 12/18/1958    Quit date: 12/01/1988  . Smokeless tobacco: Never Used  . Alcohol Use: No    Review of Systems  Constitutional: Negative for activity change and appetite change.  HENT: Negative for congestion and rhinorrhea.   Eyes: Negative for photophobia.  Respiratory: Negative for cough, chest tightness and shortness of breath.   Cardiovascular: Positive for leg swelling. Negative for chest pain.  Gastrointestinal: Negative for nausea, vomiting and abdominal pain.  Genitourinary: Negative for dysuria, urgency and hematuria.  Musculoskeletal: Positive for myalgias, joint swelling and arthralgias. Negative for back pain.  Skin: Negative for rash.  Neurological: Negative for dizziness, weakness and headaches.  A complete 10 system review of systems was obtained and all systems are negative except as noted in the HPI and PMH.      Allergies  Iodinated diagnostic agents; Cymbalta; Iodine; Lisinopril; Neurontin; and Zoloft  Home Medications   Prior to Admission medications   Medication Sig Start Date End Date Taking? Authorizing Provider  clonazePAM (KLONOPIN) 1 MG tablet Take 1 tablet (1 mg  total) by mouth 2 (two) times daily. Patient taking differently: Take 1 mg by mouth at bedtime.  04/03/15 04/02/16 Yes Cloria Spring, MD  diazepam (VALIUM) 5 MG tablet Take 5 mg by mouth every 6 (six) hours as needed for anxiety (and/sleep).    Yes Historical Provider, MD  diclofenac sodium (VOLTAREN) 1 % GEL Apply 4 g topically daily as needed (for pain).   Yes Historical Provider, MD  divalproex (DEPAKOTE) 250 MG DR tablet Take 1 tablet (250 mg total) by mouth Nightly. Patient taking differently: Take 250 mg by mouth every morning.  05/22/15  Yes Larey Seat, MD  feeding supplement, GLUCERNA SHAKE,  (GLUCERNA SHAKE) LIQD Take 237 mLs by mouth 2 (two) times daily between meals. 08/18/14  Yes Lezlie Octave Black, NP  glimepiride (AMARYL) 1 MG tablet Take 1 mg by mouth daily as needed (if blood sugar is above 150).    Yes Historical Provider, MD  HYDROcodone-acetaminophen (NORCO/VICODIN) 5-325 MG per tablet Take 0.5-1 tablets by mouth every 6 (six) hours as needed for moderate pain.   Yes Historical Provider, MD  LYRICA 50 MG capsule Take 1 capsule by mouth at bedtime.  10/03/14  Yes Historical Provider, MD  methocarbamol (ROBAXIN) 500 MG tablet Take 1 tablet (500 mg total) by mouth every 6 (six) hours as needed for muscle spasms. 05/29/15  Yes Arlee Muslim, PA-C  oxyCODONE (OXY IR/ROXICODONE) 5 MG immediate release tablet Take 1-2 tablets (5-10 mg total) by mouth every 3 (three) hours as needed for moderate pain or severe pain. 05/29/15  Yes Arlee Muslim, PA-C  pantoprazole (PROTONIX) 40 MG tablet Take 1 tablet (40 mg total) by mouth daily. 08/18/14  Yes Lezlie Octave Black, NP  predniSONE (DELTASONE) 5 MG tablet Take 5 mg by mouth every morning.  10/03/14  Yes Historical Provider, MD  traMADol (ULTRAM) 50 MG tablet Take 1-2 tablets (50-100 mg total) by mouth every 6 (six) hours as needed (mild pain). 05/29/15  Yes Arlee Muslim, PA-C  venlafaxine (EFFEXOR) 37.5 MG tablet Take 112.5 mg by mouth at bedtime.   Yes Historical Provider, MD  venlafaxine XR (EFFEXOR-XR) 75 MG 24 hr capsule Take 75 mg by mouth daily with breakfast.   Yes Historical Provider, MD  Melatonin 3 MG TABS Date melatonin 1 hour before intended bedtime with fluids. Melatonin usually works best if taken after you eliminated  light sources. Patient not taking: Reported on 06/29/2015 05/22/15   Larey Seat, MD  rivaroxaban (XARELTO) 10 MG TABS tablet Take 1 tablet (10 mg total) by mouth daily with breakfast. Take Xarelto for two and a half more weeks, then discontinue Xarelto. Once the patient has completed the blood thinner regimen, then take a Baby  81 mg Aspirin daily for three more weeks. Patient not taking: Reported on 06/29/2015 05/29/15   Arlee Muslim, PA-C  venlafaxine XR (EFFEXOR XR) 150 MG 24 hr capsule Take 1 capsule (150 mg total) by mouth 2 (two) times daily. Patient not taking: Reported on 06/29/2015 04/03/15   Cloria Spring, MD   BP 149/61 mmHg  Pulse 72  Temp(Src) 98.2 F (36.8 C) (Oral)  Resp 16  Ht 6\' 1"  (1.854 m)  Wt 202 lb 9.6 oz (91.899 kg)  BMI 26.74 kg/m2  SpO2 97% Physical Exam  Constitutional: He is oriented to person, place, and time. He appears well-developed and well-nourished. No distress.  HENT:  Head: Normocephalic and atraumatic.  Mouth/Throat: Oropharynx is clear and moist. No oropharyngeal exudate.  Eyes: Conjunctivae and EOM  are normal. Pupils are equal, round, and reactive to light.  Neck: Normal range of motion. Neck supple.  No meningismus.  Cardiovascular: Normal rate, regular rhythm, normal heart sounds and intact distal pulses.   No murmur heard. Pulmonary/Chest: Effort normal and breath sounds normal. No respiratory distress.  Abdominal: Soft. There is no tenderness. There is no rebound and no guarding.  Musculoskeletal: He exhibits edema and tenderness.  Healing midline incision to left knee. Diffuse erythema extending into the proximal leg. Serous drainage from proximal aspect of wound. Small effusion with reduced range of motion of knee Intact DP and PT pulses. No calf tenderness  Neurological: He is alert and oriented to person, place, and time. No cranial nerve deficit. He exhibits normal muscle tone. Coordination normal.  No ataxia on finger to nose bilaterally. No pronator drift. 5/5 strength throughout. CN 2-12 intact. Negative Romberg. Equal grip strength. Sensation intact. Gait is normal.   Skin: Skin is warm.  Psychiatric: He has a normal mood and affect. His behavior is normal.  Nursing note and vitals reviewed.     ED Course  Procedures (including critical care time) Labs  Review Labs Reviewed  CBC WITH DIFFERENTIAL/PLATELET - Abnormal; Notable for the following:    RBC 3.70 (*)    Hemoglobin 11.5 (*)    HCT 37.0 (*)    All other components within normal limits  COMPREHENSIVE METABOLIC PANEL - Abnormal; Notable for the following:    Glucose, Bld 213 (*)    BUN 23 (*)    Calcium 8.8 (*)    ALT 14 (*)    All other components within normal limits  SEDIMENTATION RATE - Abnormal; Notable for the following:    Sed Rate 38 (*)    All other components within normal limits  GLUCOSE, CAPILLARY - Abnormal; Notable for the following:    Glucose-Capillary 134 (*)    All other components within normal limits  GLUCOSE, CAPILLARY - Abnormal; Notable for the following:    Glucose-Capillary 153 (*)    All other components within normal limits  GLUCOSE, CAPILLARY - Abnormal; Notable for the following:    Glucose-Capillary 100 (*)    All other components within normal limits  I-STAT CG4 LACTIC ACID, ED - Abnormal; Notable for the following:    Lactic Acid, Venous 3.08 (*)    All other components within normal limits  CULTURE, BLOOD (ROUTINE X 2)  CULTURE, BLOOD (ROUTINE X 2)  PROTIME-INR  C-REACTIVE PROTEIN  LACTIC ACID, PLASMA  LACTIC ACID, PLASMA  PROCALCITONIN  PROTIME-INR  APTT  TSH  CBC WITH DIFFERENTIAL/PLATELET  CORTISOL  HEMOGLOBIN A1C  CBC  COMPREHENSIVE METABOLIC PANEL  I-STAT CG4 LACTIC ACID, ED    Imaging Review Dg Chest Port 1 View  06/29/2015   CLINICAL DATA:  Sepsis  EXAM: PORTABLE CHEST - 1 VIEW  COMPARISON:  03/08/2015  FINDINGS: The heart size and mediastinal contours are within normal limits. Both lungs are clear. The visualized skeletal structures are unremarkable.  IMPRESSION: No active disease.   Electronically Signed   By: Rolm Baptise M.D.   On: 06/29/2015 19:40   Dg Knee Complete 4 Views Left  06/29/2015   CLINICAL DATA:  LEFT total knee replacement 3 weeks prior. Wound discharge.  EXAM: LEFT KNEE - COMPLETE 4+ VIEW  COMPARISON:   None.  FINDINGS: LEFT total knee arthroplasty noted. There is a small suprapatellar joint effusion. No foreign body.  IMPRESSION: Suprapatellar joint effusion.   Electronically Signed   By: Nicole Kindred  Leonia Reeves M.D.   On: 06/29/2015 15:39     EKG Interpretation None      MDM   Final diagnoses:  Wound infection, initial encounter  Cellulitis of knee, left   Redness, drainage and swelling to left knee after knee replacement on June 26. No fever. No vomiting. Patient systemically feels well. Nontoxic-appearing.  Knee today appears better than on daughter's picture from [facility].  Lactate 3.  WBC normal.   D/w Dr. Doran Durand on call for Dr. Maureen Ralphs,.  He does not recommend joint aspiration.  He states with elevated lactate, may be reasonable to admit patient for IV antibiotics.  Xray with small effusion. D/w Dr. Humphrey Rolls who will admit for IV antibiotics.  Ezequiel Essex, MD 06/30/15 [phone]

## 2015-06-29 NOTE — ED Notes (Signed)
md at bedside

## 2015-06-29 NOTE — ED Notes (Signed)
PT states having total knee on 05/27/15 and had recurrent rounds of antibiotics for infection to left knee. PT c/o yellow/clear/bloody drainage with swelling and pain to left knee.

## 2015-06-29 NOTE — ED Notes (Signed)
Pulses felt manually and by doppler on left foot.

## 2015-06-29 NOTE — Progress Notes (Addendum)
ANTIBIOTIC CONSULT NOTE - INITIAL  Pharmacy Consult for Vancomycin & Zosyn Indication: cellulitis  Allergies  Allergen Reactions  . Iodinated Diagnostic Agents Hives and Itching    Other reaction(s): Other (See Comments) Other Reaction: iv contrast=hives  . Cymbalta [Duloxetine Hcl] Other (See Comments)    Makes patient have jerking motions.  . Iodine Hives    very allergic(per daughter)  . Lisinopril Other (See Comments)    Makes patient have jerking motions.  . Neurontin [Gabapentin] Other (See Comments)    Makes patient have jerking motions.  [name] Kitchen Zoloft [Sertraline Hcl] Other (See Comments) and Diarrhea    Makes patient have jerking motions.    Patient Measurements: Height: 6\' 1"  (185.4 cm) Weight: 204 lb (92.534 kg) IBW/kg (Calculated) : 79.9 Adjusted Body Weight:   Vital Signs: Temp: 98.1 F (36.7 C) (07/29 1418) Temp Source: Oral (07/29 1418) BP: 140/62 mmHg (07/29 1630) Pulse Rate: 59 (07/29 1630) Intake/Output from previous day:   Intake/Output from this shift:    Labs:  Recent Labs  06/29/15 1435  WBC 9.3  HGB 11.5*  PLT 267  CREATININE 1.04   Estimated Creatinine Clearance: 60.8 mL/min (by C-G formula based on Cr of 1.04). No results for input(s): VANCOTROUGH, VANCOPEAK, VANCORANDOM, GENTTROUGH, GENTPEAK, GENTRANDOM, TOBRATROUGH, TOBRAPEAK, TOBRARND, AMIKACINPEAK, AMIKACINTROU, AMIKACIN in the last 72 hours.   Microbiology: No results found for this or any previous visit (from the past 720 hour(s)).  Medical History: Past Medical History  Diagnosis Date  . Chronic headaches   . Type 2 diabetes mellitus   . Essential hypertension   . Diverticulitis   . Syncope   . Hypercholesteremia   . GI bleed     Dr. Laural Golden - 1998  . Peptic ulcer disease   . Anemia, iron deficiency   . Depression   . Chronic kidney disease   . Peripheral neuropathy     Medications:   (Not in a hospital admission) Assessment: ED patient states having total knee on  05/27/15 and had recurrent rounds of antibiotics for infection to left knee. PT c/o yellow/clear/bloody drainage with swelling and pain to left knee. Vancomycin for cellulitis  Goal of Therapy:  Vancomycin trough level 10-15 mcg/ml  Plan:  Vancomycin 1500 mg IV x 1 dose, then Vancomycin 1 GM IV every 12 hours Zosyn 3.375 GM IV every 8 hours Vancomycin trough at steady state Monitor renal function Labs per protocol  Jared Bridges, Jared Bridges 06/29/2015,4:37 PM

## 2015-06-29 NOTE — H&P (Signed)
[facility] History and Physical  [name] NWG:[id number] DOB: 1929/12/25 DOA: 06/29/2015  Referring physician: Ezequiel Essex, MD PCP: Doree Albee, MD   Chief Complaint: Knee Pain  HPI: Jared Bridges is a 81 y.o. male with prior history of HTN DM recent knee surgery presents with knee infection. Patient underwent knee replacement about a month ago. Patient was seen back in the office and was prescribed keflex according to the daughter. Over the course of the last few days there has been increasing redness and he has felt the knee to be warm. There has been some clear non-bloody or purulent drainage noted. The daughter showed me a picture from July 3rd and the appearance actually looks better now than it did on 7/3. He has no fevers presently he does not look toxic. Patient is being admitted for IV antibiotics. The ED spoke with orthopedics on call and they agreed with IV antibiotics and did not want to do a tap at this time unless he worsens.   Review of Systems:  12 point ROS performed and is unremarkable other than HPI.   Past Medical History  Diagnosis Date  . Chronic headaches   . Type 2 diabetes mellitus   . Essential hypertension   . Diverticulitis   . Syncope   . Hypercholesteremia   . GI bleed     Dr. Laural Golden - 1998  . Peptic ulcer disease   . Anemia, iron deficiency   . Depression   . Chronic kidney disease   . Peripheral neuropathy    Past Surgical History  Procedure Laterality Date  . Uvulectomy  2004  . Spinal fusion    . Spinal fusion    . Colon surgery      FOR DIVERTICULOSIS  . Tonsillectomy    . Eye surgery      right cataract with lens implant  . Appendectomy      as child  . Hernia repair      1-right inguinal, 3- left inguinal  . Total knee arthroplasty Left 05/28/2015    Procedure: LEFT TOTAL KNEE ARTHROPLASTY;  Surgeon: Gaynelle Arabian, MD;  Location: [facility];  Service: Orthopedics;  Laterality: Left;   Social History:  reports that  he quit smoking about 26 years ago. His smoking use included Cigarettes. He started smoking about 56 years ago. He smoked 0.10 packs per day. He has never used smokeless tobacco. He reports that he does not drink alcohol or use illicit drugs.  Allergies  Allergen Reactions  . Iodinated Diagnostic Agents Hives and Itching    Other reaction(s): Other (See Comments) Other Reaction: iv contrast=hives  . Cymbalta [Duloxetine Hcl] Other (See Comments)    Makes patient have jerking motions.  . Iodine Hives    very allergic(per daughter)  . Lisinopril Other (See Comments)    Makes patient have jerking motions.  . Neurontin [Gabapentin] Other (See Comments)    Makes patient have jerking motions.  [name] Kitchen Zoloft [Sertraline Hcl] Other (See Comments) and Diarrhea    Makes patient have jerking motions.    Family History  Problem Relation Age of Onset  . Hypertension    . Depression Brother   . Depression Daughter      Prior to Admission medications   Medication Sig Start Date End Date Taking? Authorizing Provider  clonazePAM (KLONOPIN) 1 MG tablet Take 1 tablet (1 mg total) by mouth 2 (two) times daily. Patient taking differently: Take 1 mg by mouth at bedtime.  04/03/15 04/02/16  Yes Cloria Spring, MD  diazepam (VALIUM) 5 MG tablet Take 5 mg by mouth every 6 (six) hours as needed for anxiety (and/sleep).    Yes Historical Provider, MD  diclofenac sodium (VOLTAREN) 1 % GEL Apply 4 g topically daily as needed (for pain).   Yes Historical Provider, MD  divalproex (DEPAKOTE) 250 MG DR tablet Take 1 tablet (250 mg total) by mouth Nightly. Patient taking differently: Take 250 mg by mouth every morning.  05/22/15  Yes Larey Seat, MD  feeding supplement, GLUCERNA SHAKE, (GLUCERNA SHAKE) LIQD Take 237 mLs by mouth 2 (two) times daily between meals. 08/18/14  Yes Lezlie Octave Black, NP  glimepiride (AMARYL) 1 MG tablet Take 1 mg by mouth daily as needed (if blood sugar is above 150).    Yes Historical Provider, MD   HYDROcodone-acetaminophen (NORCO/VICODIN) 5-325 MG per tablet Take 0.5-1 tablets by mouth every 6 (six) hours as needed for moderate pain.   Yes Historical Provider, MD  LYRICA 50 MG capsule Take 1 capsule by mouth at bedtime.  10/03/14  Yes Historical Provider, MD  methocarbamol (ROBAXIN) 500 MG tablet Take 1 tablet (500 mg total) by mouth every 6 (six) hours as needed for muscle spasms. 05/29/15  Yes Arlee Muslim, PA-C  oxyCODONE (OXY IR/ROXICODONE) 5 MG immediate release tablet Take 1-2 tablets (5-10 mg total) by mouth every 3 (three) hours as needed for moderate pain or severe pain. 05/29/15  Yes Arlee Muslim, PA-C  pantoprazole (PROTONIX) 40 MG tablet Take 1 tablet (40 mg total) by mouth daily. 08/18/14  Yes Lezlie Octave Black, NP  predniSONE (DELTASONE) 5 MG tablet Take 5 mg by mouth every morning.  10/03/14  Yes Historical Provider, MD  traMADol (ULTRAM) 50 MG tablet Take 1-2 tablets (50-100 mg total) by mouth every 6 (six) hours as needed (mild pain). 05/29/15  Yes Arlee Muslim, PA-C  venlafaxine (EFFEXOR) 37.5 MG tablet Take 112.5 mg by mouth at bedtime.   Yes Historical Provider, MD  venlafaxine XR (EFFEXOR-XR) 75 MG 24 hr capsule Take 75 mg by mouth daily with breakfast.   Yes Historical Provider, MD  Melatonin 3 MG TABS Date melatonin 1 hour before intended bedtime with fluids. Melatonin usually works best if taken after you eliminated  light sources. Patient not taking: Reported on 06/29/2015 05/22/15   Larey Seat, MD  rivaroxaban (XARELTO) 10 MG TABS tablet Take 1 tablet (10 mg total) by mouth daily with breakfast. Take Xarelto for two and a half more weeks, then discontinue Xarelto. Once the patient has completed the blood thinner regimen, then take a Baby 81 mg Aspirin daily for three more weeks. Patient not taking: Reported on 06/29/2015 05/29/15   Arlee Muslim, PA-C  venlafaxine XR (EFFEXOR XR) 150 MG 24 hr capsule Take 1 capsule (150 mg total) by mouth 2 (two) times daily. Patient not  taking: Reported on 06/29/2015 04/03/15   Cloria Spring, MD   Physical Exam: Filed Vitals:   06/29/15 1418 06/29/15 1500 06/29/15 1630  BP: 148/61 110/59 140/62  Pulse: 77 68 59  Temp: 98.1 F (36.7 C)    TempSrc: Oral    Resp: 16    Height: 6\' 1"  (1.854 m)    Weight: 92.534 kg (204 lb)    SpO2: 98% 95% 94%    Wt Readings from Last 3 Encounters:  06/29/15 92.534 kg (204 lb)  05/28/15 94.802 kg (209 lb)  05/22/15 94.802 kg (209 lb)    General:  Appears calm and comfortable Eyes:  PERRL, normal lids, irises & conjunctiva ENT: grossly normal hearing, lips & tongue Neck: no LAD, masses or thyromegaly Cardiovascular: RRR, no m/r/g. No LE edema. Respiratory: CTA bilaterally, no w/r/r. Normal respiratory effort. Abdomen: soft, ntnd Skin: Left Knee Erythematous minimal drainage noted with warmth and tenderness on palpation and movement Musculoskeletal: grossly normal tone in limbs examined Psychiatric: grossly normal mood and affect Neurologic: grossly non-focal.          Labs on Admission:  Basic Metabolic Panel:  Recent Labs Lab 06/29/15 1435  NA 136  K 3.9  CL 101  CO2 27  GLUCOSE 213*  BUN 23*  CREATININE 1.04  CALCIUM 8.8*   Liver Function Tests:  Recent Labs Lab 06/29/15 1435  AST 21  ALT 14*  ALKPHOS 72  BILITOT 0.5  PROT 6.9  ALBUMIN 3.5   No results for input(s): LIPASE, AMYLASE in the last 168 hours. No results for input(s): AMMONIA in the last 168 hours. CBC:  Recent Labs Lab 06/29/15 1435  WBC 9.3  NEUTROABS 7.0  HGB 11.5*  HCT 37.0*  MCV 100.0  PLT 267   Cardiac Enzymes: No results for input(s): CKTOTAL, CKMB, CKMBINDEX, TROPONINI in the last 168 hours.  BNP (last 3 results) No results for input(s): BNP in the last 8760 hours.  ProBNP (last 3 results) No results for input(s): PROBNP in the last 8760 hours.  CBG: No results for input(s): GLUCAP in the last 168 hours.  Radiological Exams on Admission: Dg Knee Complete 4 Views  Left  06/29/2015   CLINICAL DATA:  LEFT total knee replacement 3 weeks prior. Wound discharge.  EXAM: LEFT KNEE - COMPLETE 4+ VIEW  COMPARISON:  None.  FINDINGS: LEFT total knee arthroplasty noted. There is a small suprapatellar joint effusion. No foreign body.  IMPRESSION: Suprapatellar joint effusion.   Electronically Signed   By: Suzy Bouchard M.D.   On: 06/29/2015 15:39     Assessment/Plan Active Problems:   Essential hypertension   Anemia, iron deficiency   OA (osteoarthritis) of knee   Type 2 diabetes mellitus, uncontrolled   Cellulitis of knee, left   Wound infection after surgery   1. Wound infection after surgery involving the left knee -will admit for IV antibiotics family wants to stay here -will start on Zosyn and Vancocin -cultures ordered lactate ordered and will follow up -currently patient is hemodynamically stable and has no elevation in WBC will need to follow  2. Cellulitis of left knee -as above will start on zosyn and vancocin empirically -cultures have been ordered  3. HTN -will monitor pressures currently patient is not on any antihypertensives  4. Type 2 DM uncontrolled -will monitor FSBS place on SSI -will check A1C  5. Osteoarthritis -pain meds as needed     Code Status: full code (must indicate code status--if unknown or must be presumed, indicate so) DVT Prophylaxis:heparin Family Communication: none (indicate person spoken with, if applicable, with phone number if by telephone) Disposition Plan: home (indicate anticipated LOS)  Time spent: 50min  Elfreda Blanchet A [facility] Pager [phone]

## 2015-06-30 DIAGNOSIS — L03116 Cellulitis of left lower limb: Secondary | ICD-10-CM

## 2015-06-30 DIAGNOSIS — I10 Essential (primary) hypertension: Secondary | ICD-10-CM

## 2015-06-30 LAB — GLUCOSE, CAPILLARY
GLUCOSE-CAPILLARY: 100 mg/dL — AB (ref 65–99)
GLUCOSE-CAPILLARY: 81 mg/dL (ref 65–99)
Glucose-Capillary: 98 mg/dL (ref 65–99)
Glucose-Capillary: 120 mg/dL — ABNORMAL HIGH (ref 65–99)

## 2015-06-30 LAB — COMPREHENSIVE METABOLIC PANEL
ALK PHOS: 60 U/L (ref 38–126)
ALT: 14 U/L — AB (ref 17–63)
AST: 16 U/L (ref 15–41)
Albumin: 3.2 g/dL — ABNORMAL LOW (ref 3.5–5.0)
Anion gap: 6 (ref 5–15)
BILIRUBIN TOTAL: 0.8 mg/dL (ref 0.3–1.2)
BUN: 15 mg/dL (ref 6–20)
CO2: 29 mmol/L (ref 22–32)
Calcium: 8.9 mg/dL (ref 8.9–10.3)
Chloride: 106 mmol/L (ref 101–111)
Creatinine, Ser: 0.88 mg/dL (ref 0.61–1.24)
GFR calc Af Amer: >60 mL/min (ref >60)
GFR calc non Af Amer: >60 mL/min (ref >60)
Glucose, Bld: 120 mg/dL — ABNORMAL HIGH (ref 65–99)
Potassium: 3.8 mmol/L (ref 3.5–5.1)
Sodium: 141 mmol/L (ref 135–145)
Total Protein: 6 g/dL — ABNORMAL LOW (ref 6.5–8.1)

## 2015-06-30 LAB — CBC
HCT: 35.7 % — ABNORMAL LOW (ref 39.0–52.0)
Hemoglobin: 11.1 g/dL — ABNORMAL LOW (ref 13.0–17.0)
MCH: 31.1 pg (ref 26.0–34.0)
MCHC: 31.1 g/dL (ref 30.0–36.0)
MCV: 100 fL (ref 78.0–100.0)
Platelets: 246 10*3/uL (ref 150–400)
RBC: 3.57 MIL/uL — ABNORMAL LOW (ref 4.22–5.81)
RDW: 14 % (ref 11.5–15.5)
WBC: 9.1 10*3/uL (ref 4.0–10.5)

## 2015-06-30 LAB — CORTISOL: Cortisol, Plasma: 3.4 ug/dL

## 2015-06-30 MED ORDER — DOXYCYCLINE HYCLATE 100 MG PO TABS: 100.0000 mg | ORAL_TABLET | Freq: Two times a day (BID) | ORAL | Status: DC

## 2015-06-30 NOTE — Discharge Summary (Signed)
Physician Discharge Summary  Jared Bridges TOI:[id number] DOB: 03/08/1930 DOA: 06/29/2015  PCP: Doree Albee, MD  Admit date: 06/29/2015 Discharge date: 06/30/2015  Time spent: 45 minutes  Recommendations for Outpatient Follow-up:  -Will be discharged home today. -To complete 10 days of doxycycline. -We'll need to follow-up with Dr. Maureen Ralphs in one week.   Discharge Diagnoses:  Active Problems:   Essential hypertension   Anemia, iron deficiency   OA (osteoarthritis) of knee   Type 2 diabetes mellitus, uncontrolled   Cellulitis of knee, left   Wound infection after surgery   Discharge Condition: Stable and improved  Filed Weights   06/29/15 1418 06/29/15 1841  Weight: 92.534 kg (204 lb) 91.899 kg (202 lb 9.6 oz)    History of present illness:  Jared Bridges is a 81 y.o. male with prior history of HTN DM recent knee surgery presents with knee infection. Patient underwent knee replacement about a month ago. Patient was seen back in the office and was prescribed keflex according to the daughter. Over the course of the last few days there has been increasing redness and he has felt the knee to be warm. There has been some clear non-bloody or purulent drainage noted. The daughter showed me a picture from July 3rd and the appearance actually looks better now than it did on 7/3. He has no fevers presently he does not look toxic. Patient is being admitted for IV antibiotics. The ED spoke with orthopedics on call and they agreed with IV antibiotics and did not want to do a tap at this time unless he worsens.  Hospital Course:   Left knee cellulitis -This is after a left knee replacement at the end of June. -Had rapid improvement overnight with vancomycin and Zosyn, about 80% better. -We'll discharge home today on a 10 day course of doxycycline. -Has had issues with wound infection since surgery, had been prescribed a course of Keflex at the beginning of July. -If he continues to  have infection after treatment with this current course of antibiotics, consider consultation with infectious diseases as he does have hardware in that knee and may need long-term suppressive antibiotics versus reopening of the surgical site at the discretion of orthopedics.  Type 2 diabetes -Well-controlled.     Procedures:  None   Consultations:  None  Discharge Instructions  Discharge Instructions    Increase activity slowly    Complete by:  As directed             Medication List    STOP taking these medications        Melatonin 3 MG Tabs      TAKE these medications        clonazePAM 1 MG tablet  Commonly known as:  KLONOPIN  Take 1 tablet (1 mg total) by mouth 2 (two) times daily.     diazepam 5 MG tablet  Commonly known as:  VALIUM  Take 5 mg by mouth every 6 (six) hours as needed for anxiety (and/sleep).     diclofenac sodium 1 % Gel  Commonly known as:  VOLTAREN  Apply 4 g topically daily as needed (for pain).     divalproex 250 MG DR tablet  Commonly known as:  DEPAKOTE  Take 1 tablet (250 mg total) by mouth Nightly.     doxycycline 100 MG tablet  Commonly known as:  VIBRA-TABS  Take 1 tablet (100 mg total) by mouth 2 (two) times daily.  feeding supplement (GLUCERNA SHAKE) Liqd  Take 237 mLs by mouth 2 (two) times daily between meals.     glimepiride 1 MG tablet  Commonly known as:  AMARYL  Take 1 mg by mouth daily as needed (if blood sugar is above 150).     HYDROcodone-acetaminophen 5-325 MG per tablet  Commonly known as:  NORCO/VICODIN  Take 0.5-1 tablets by mouth every 6 (six) hours as needed for moderate pain.     LYRICA 50 MG capsule  Generic drug:  pregabalin  Take 1 capsule by mouth at bedtime.     methocarbamol 500 MG tablet  Commonly known as:  ROBAXIN  Take 1 tablet (500 mg total) by mouth every 6 (six) hours as needed for muscle spasms.     oxyCODONE 5 MG immediate release tablet  Commonly known as:  Oxy IR/ROXICODONE    Take 1-2 tablets (5-10 mg total) by mouth every 3 (three) hours as needed for moderate pain or severe pain.     pantoprazole 40 MG tablet  Commonly known as:  PROTONIX  Take 1 tablet (40 mg total) by mouth daily.     predniSONE 5 MG tablet  Commonly known as:  DELTASONE  Take 5 mg by mouth every morning.     rivaroxaban 10 MG Tabs tablet  Commonly known as:  XARELTO  Take 1 tablet (10 mg total) by mouth daily with breakfast. Take Xarelto for two and a half more weeks, then discontinue Xarelto. Once the patient has completed the blood thinner regimen, then take a Baby 81 mg Aspirin daily for three more weeks.     traMADol 50 MG tablet  Commonly known as:  ULTRAM  Take 1-2 tablets (50-100 mg total) by mouth every 6 (six) hours as needed (mild pain).     venlafaxine XR 75 MG 24 hr capsule  Commonly known as:  EFFEXOR-XR  Take 75 mg by mouth daily with breakfast.     venlafaxine 37.5 MG tablet  Commonly known as:  EFFEXOR  Take 112.5 mg by mouth at bedtime.       Allergies  Allergen Reactions  . Iodinated Diagnostic Agents Hives and Itching    Other reaction(s): Other (See Comments) Other Reaction: iv contrast=hives  . Cymbalta [Duloxetine Hcl] Other (See Comments)    Makes patient have jerking motions.  . Iodine Hives    very allergic(per daughter)  . Lisinopril Other (See Comments)    Makes patient have jerking motions.  . Neurontin [Gabapentin] Other (See Comments)    Makes patient have jerking motions.  [name] Kitchen Zoloft [Sertraline Hcl] Other (See Comments) and Diarrhea    Makes patient have jerking motions.       Follow-up Information    Follow up with [name], MD. Schedule an appointment as soon as possible for a visit in 1 week.   Specialty:  Orthopedic Surgery   Contact information:   [address] [place] [address] [phone]        The results of significant diagnostics from this hospitalization (including imaging, microbiology,  ancillary and laboratory) are listed below for reference.    Significant Diagnostic Studies: Dg Chest Port 1 View  06/29/2015   CLINICAL DATA:  Sepsis  EXAM: PORTABLE CHEST - 1 VIEW  COMPARISON:  03/08/2015  FINDINGS: The heart size and mediastinal contours are within normal limits. Both lungs are clear. The visualized skeletal structures are unremarkable.  IMPRESSION: No active disease.   Electronically Signed   By: Lennette Bihari  [place] M.D.   On: 06/29/2015 19:40   Dg Knee Complete 4 Views Left  06/29/2015   CLINICAL DATA:  LEFT total knee replacement 3 weeks prior. Wound discharge.  EXAM: LEFT KNEE - COMPLETE 4+ VIEW  COMPARISON:  None.  FINDINGS: LEFT total knee arthroplasty noted. There is a small suprapatellar joint effusion. No foreign body.  IMPRESSION: Suprapatellar joint effusion.   Electronically Signed   By: Suzy Bouchard M.D.   On: 06/29/2015 15:39    Microbiology: Recent Results (from the past 240 hour(s))  Blood culture (routine x 2)     Status: None (Preliminary result)   Collection Time: 06/29/15  3:10 PM  Result Value Ref Range Status   Specimen Description LEFT ANTECUBITAL  Final   Special Requests BOTTLES DRAWN AEROBIC AND ANAEROBIC 6CC  Final   Culture NO GROWTH < 24 HOURS  Final   Report Status PENDING  Incomplete  Blood culture (routine x 2)     Status: None (Preliminary result)   Collection Time: 06/29/15  3:30 PM  Result Value Ref Range Status   Specimen Description BLOOD RIGHT HAND  Final   Special Requests BOTTLES DRAWN AEROBIC AND ANAEROBIC 6CC  Final   Culture NO GROWTH < 24 HOURS  Final   Report Status PENDING  Incomplete     Labs: Basic Metabolic Panel:  Recent Labs Lab 06/29/15 1435 06/30/15 0640  NA 136 141  K 3.9 3.8  CL 101 106  CO2 27 29  GLUCOSE 213* 120*  BUN 23* 15  CREATININE 1.04 0.88  CALCIUM 8.8* 8.9   Liver Function Tests:  Recent Labs Lab 06/29/15 1435 06/30/15 0640  AST 21 16  ALT 14* 14*  ALKPHOS 72 60  BILITOT 0.5 0.8    PROT 6.9 6.0*  ALBUMIN 3.5 3.2*   No results for input(s): LIPASE, AMYLASE in the last 168 hours. No results for input(s): AMMONIA in the last 168 hours. CBC:  Recent Labs Lab 06/29/15 1435 06/30/15 0640  WBC 9.3 9.1  NEUTROABS 7.0  --   HGB 11.5* 11.1*  HCT 37.0* 35.7*  MCV 100.0 100.0  PLT 267 246   Cardiac Enzymes: No results for input(s): CKTOTAL, CKMB, CKMBINDEX, TROPONINI in the last 168 hours. BNP: BNP (last 3 results) No results for input(s): BNP in the last 8760 hours.  ProBNP (last 3 results) No results for input(s): PROBNP in the last 8760 hours.  CBG:  Recent Labs Lab 06/29/15 2026 06/30/15 0028 06/30/15 0431 06/30/15 0831 06/30/15 1152  GLUCAP 153* 100* 81 98 120*       Signed:  [facility] Pager: [phone] 06/30/2015, 4:12 PM

## 2015-06-30 NOTE — Progress Notes (Signed)
Patient discharged home with instructions given on medications,and follow up visits,patient,and family verbalized understanding. Prescriptions sent with patient. Accompanied by staff to an awaiting vehicle.[name] Kitchen

## 2015-07-02 LAB — HEMOGLOBIN A1C
Hgb A1c MFr Bld: 5.5 % (ref 4.8–5.6)
MEAN PLASMA GLUCOSE: 111 mg/dL

## 2015-07-04 LAB — CULTURE, BLOOD (ROUTINE X 2)
CULTURE: NO GROWTH
Culture: NO GROWTH

## 2015-07-06 ENCOUNTER — Ambulatory Visit: Payer: Self-pay | Admitting: Orthopedic Surgery

## 2015-07-06 NOTE — Progress Notes (Signed)
Completed preop phone call with patient and wife.  Wife repeated information back to me.  Both voiced understanding.  Daugther, [name] called and she wanted to make sure she had copy of instructions.  Reviewed preop instructions with daughter [name]. Her phone number is - [phone].  [name] voiced understanding of preop instructions.

## 2015-07-06 NOTE — Progress Notes (Signed)
Preoperative surgical orders have been place into the Epic hospital system for Jared Bridges on 07/06/2015, 9:27 AM  by Mickel Crow for surgery on 07/09/2015.  Preop Knee Scope orders including IV Tylenol and IV Decadron as long as there are no contraindications to the above medications. Arlee Muslim, PA-C

## 2015-07-09 ENCOUNTER — Encounter (HOSPITAL_COMMUNITY): Payer: Self-pay | Admitting: *Deleted

## 2015-07-09 ENCOUNTER — Ambulatory Visit (HOSPITAL_COMMUNITY): Payer: Medicare Other | Admitting: Anesthesiology

## 2015-07-09 ENCOUNTER — Encounter (HOSPITAL_COMMUNITY)
Admission: RE | Disposition: A | Discharge status: Home Health | Payer: Self-pay | Source: Ambulatory Visit | Attending: Orthopedic Surgery

## 2015-07-09 ENCOUNTER — Inpatient Hospital Stay (HOSPITAL_COMMUNITY)
Admission: RE | Admit: 2015-07-09 | Discharge: 2015-07-12 | DRG: 909 | Disposition: A | Discharge status: Home Health | Payer: Medicare Other | Source: Ambulatory Visit | Attending: Orthopedic Surgery | Admitting: Orthopedic Surgery

## 2015-07-09 DIAGNOSIS — Y838 Other surgical procedures as the cause of abnormal reaction of the patient, or of later complication, without mention of misadventure at the time of the procedure: Secondary | ICD-10-CM | POA: Diagnosis present

## 2015-07-09 DIAGNOSIS — Z96652 Presence of left artificial knee joint: Secondary | ICD-10-CM | POA: Diagnosis present

## 2015-07-09 DIAGNOSIS — N189 Chronic kidney disease, unspecified: Secondary | ICD-10-CM | POA: Diagnosis present

## 2015-07-09 DIAGNOSIS — E1122 Type 2 diabetes mellitus with diabetic chronic kidney disease: Secondary | ICD-10-CM | POA: Diagnosis present

## 2015-07-09 DIAGNOSIS — Z981 Arthrodesis status: Secondary | ICD-10-CM | POA: Diagnosis not present

## 2015-07-09 DIAGNOSIS — Z79891 Long term (current) use of opiate analgesic: Secondary | ICD-10-CM | POA: Diagnosis not present

## 2015-07-09 DIAGNOSIS — F329 Major depressive disorder, single episode, unspecified: Secondary | ICD-10-CM | POA: Diagnosis present

## 2015-07-09 DIAGNOSIS — G629 Polyneuropathy, unspecified: Secondary | ICD-10-CM | POA: Diagnosis present

## 2015-07-09 DIAGNOSIS — Z7952 Long term (current) use of systemic steroids: Secondary | ICD-10-CM

## 2015-07-09 DIAGNOSIS — Z87891 Personal history of nicotine dependence: Secondary | ICD-10-CM

## 2015-07-09 DIAGNOSIS — E780 Pure hypercholesterolemia: Secondary | ICD-10-CM | POA: Diagnosis present

## 2015-07-09 DIAGNOSIS — I129 Hypertensive chronic kidney disease with stage 1 through stage 4 chronic kidney disease, or unspecified chronic kidney disease: Secondary | ICD-10-CM | POA: Diagnosis present

## 2015-07-09 DIAGNOSIS — Z8711 Personal history of peptic ulcer disease: Secondary | ICD-10-CM | POA: Diagnosis not present

## 2015-07-09 DIAGNOSIS — T8131XA Disruption of external operation (surgical) wound, not elsewhere classified, initial encounter: Secondary | ICD-10-CM | POA: Diagnosis present

## 2015-07-09 DIAGNOSIS — Z79899 Other long term (current) drug therapy: Secondary | ICD-10-CM | POA: Diagnosis not present

## 2015-07-09 DIAGNOSIS — R51 Headache: Secondary | ICD-10-CM | POA: Diagnosis present

## 2015-07-09 HISTORY — PX: IRRIGATION AND DEBRIDEMENT KNEE: SHX5185

## 2015-07-09 LAB — GLUCOSE, CAPILLARY: Glucose-Capillary: 145 mg/dL — ABNORMAL HIGH (ref 65–99)

## 2015-07-09 SURGERY — IRRIGATION AND DEBRIDEMENT KNEE
Anesthesia: General | Site: Knee | Laterality: Left

## 2015-07-09 MED ORDER — BISACODYL 10 MG RE SUPP: 10.0000 mg | Freq: Every day | RECTAL | Status: DC | PRN

## 2015-07-09 MED ORDER — LIDOCAINE HCL (CARDIAC) 20 MG/ML IV SOLN
INTRAVENOUS | Status: DC | PRN
  Administered 2015-07-09: 30 mg via INTRAVENOUS

## 2015-07-09 MED ORDER — ACETAMINOPHEN 10 MG/ML IV SOLN
INTRAVENOUS | Status: AC
  Filled 2015-07-09: qty 100

## 2015-07-09 MED ORDER — ONDANSETRON HCL 4 MG/2ML IJ SOLN
INTRAMUSCULAR | Status: AC
  Filled 2015-07-09: qty 2

## 2015-07-09 MED ORDER — PROPOFOL 10 MG/ML IV BOLUS
INTRAVENOUS | Status: DC | PRN
  Administered 2015-07-09: 150 mg via INTRAVENOUS

## 2015-07-09 MED ORDER — METOCLOPRAMIDE HCL 5 MG/ML IJ SOLN: 5.0000 mg | Freq: Three times a day (TID) | INTRAMUSCULAR | Status: DC | PRN

## 2015-07-09 MED ORDER — SODIUM CHLORIDE 0.9 % IR SOLN
Status: DC | PRN
  Administered 2015-07-09: 6000 mL

## 2015-07-09 MED ORDER — ONDANSETRON HCL 4 MG PO TABS: 4.0000 mg | ORAL_TABLET | Freq: Four times a day (QID) | ORAL | Status: DC | PRN

## 2015-07-09 MED ORDER — LACTATED RINGERS IV SOLN: INTRAVENOUS | Status: DC

## 2015-07-09 MED ORDER — PREDNISONE 5 MG PO TABS
5.0000 mg | ORAL_TABLET | Freq: Every morning | ORAL | Status: DC
  Administered 2015-07-11 – 2015-07-12 (×2): 5 mg via ORAL
  Filled 2015-07-09 (×2): qty 1

## 2015-07-09 MED ORDER — ACETAMINOPHEN 10 MG/ML IV SOLN
1000.0000 mg | Freq: Once | INTRAVENOUS | Status: AC
  Administered 2015-07-09: 1000 mg via INTRAVENOUS
  Filled 2015-07-09: qty 100

## 2015-07-09 MED ORDER — FENTANYL CITRATE (PF) 100 MCG/2ML IJ SOLN
INTRAMUSCULAR | Status: DC | PRN
  Administered 2015-07-09 (×2): 50 ug via INTRAVENOUS

## 2015-07-09 MED ORDER — DIVALPROEX SODIUM 250 MG PO DR TAB
250.0000 mg | DELAYED_RELEASE_TABLET | Freq: Every morning | ORAL | Status: DC
  Administered 2015-07-10 – 2015-07-12 (×3): 250 mg via ORAL
  Filled 2015-07-09 (×3): qty 1

## 2015-07-09 MED ORDER — PREGABALIN 50 MG PO CAPS
50.0000 mg | ORAL_CAPSULE | Freq: Every day | ORAL | Status: DC
  Administered 2015-07-09 – 2015-07-11 (×3): 50 mg via ORAL
  Filled 2015-07-09 (×3): qty 1

## 2015-07-09 MED ORDER — ACETAMINOPHEN 500 MG PO TABS
1000.0000 mg | ORAL_TABLET | Freq: Four times a day (QID) | ORAL | Status: AC
  Filled 2015-07-09 (×2): qty 2

## 2015-07-09 MED ORDER — VENLAFAXINE HCL 37.5 MG PO TABS
112.5000 mg | ORAL_TABLET | Freq: Every day | ORAL | Status: DC
  Administered 2015-07-09 – 2015-07-11 (×3): 112.5 mg via ORAL
  Filled 2015-07-09 (×4): qty 1

## 2015-07-09 MED ORDER — FENTANYL CITRATE (PF) 100 MCG/2ML IJ SOLN
INTRAMUSCULAR | Status: AC
  Administered 2015-07-09: 25 ug via INTRAVENOUS
  Filled 2015-07-09: qty 2

## 2015-07-09 MED ORDER — ACETAMINOPHEN 325 MG PO TABS: 650.0000 mg | ORAL_TABLET | Freq: Four times a day (QID) | ORAL | Status: DC | PRN

## 2015-07-09 MED ORDER — DOCUSATE SODIUM 100 MG PO CAPS
100.0000 mg | ORAL_CAPSULE | Freq: Two times a day (BID) | ORAL | Status: DC
  Administered 2015-07-09 – 2015-07-12 (×6): 100 mg via ORAL

## 2015-07-09 MED ORDER — SODIUM CHLORIDE 0.9 % IV SOLN
INTRAVENOUS | Status: DC
  Administered 2015-07-09: 1000 mL via INTRAVENOUS
  Administered 2015-07-10: 05:00:00 via INTRAVENOUS

## 2015-07-09 MED ORDER — CHLORHEXIDINE GLUCONATE 4 % EX LIQD: 60.0000 mL | Freq: Once | CUTANEOUS | Status: DC

## 2015-07-09 MED ORDER — CLONAZEPAM 1 MG PO TABS: 1.0000 mg | ORAL_TABLET | Freq: Two times a day (BID) | ORAL | Status: DC | PRN

## 2015-07-09 MED ORDER — METOCLOPRAMIDE HCL 10 MG PO TABS: 5.0000 mg | ORAL_TABLET | Freq: Three times a day (TID) | ORAL | Status: DC | PRN

## 2015-07-09 MED ORDER — FENTANYL CITRATE (PF) 100 MCG/2ML IJ SOLN
INTRAMUSCULAR | Status: AC
  Filled 2015-07-09: qty 4

## 2015-07-09 MED ORDER — CEFAZOLIN SODIUM-DEXTROSE 2-3 GM-% IV SOLR
2.0000 g | Freq: Four times a day (QID) | INTRAVENOUS | Status: DC
  Administered 2015-07-10 – 2015-07-12 (×10): 2 g via INTRAVENOUS
  Filled 2015-07-09 (×13): qty 50

## 2015-07-09 MED ORDER — SODIUM CHLORIDE 0.9 % IV SOLN: INTRAVENOUS | Status: DC

## 2015-07-09 MED ORDER — PANTOPRAZOLE SODIUM 40 MG PO TBEC
40.0000 mg | DELAYED_RELEASE_TABLET | Freq: Every day | ORAL | Status: DC
  Administered 2015-07-10 – 2015-07-12 (×3): 40 mg via ORAL
  Filled 2015-07-09 (×4): qty 1

## 2015-07-09 MED ORDER — CEFAZOLIN SODIUM-DEXTROSE 2-3 GM-% IV SOLR
INTRAVENOUS | Status: AC
  Filled 2015-07-09: qty 50

## 2015-07-09 MED ORDER — ACETAMINOPHEN 650 MG RE SUPP: 650.0000 mg | Freq: Four times a day (QID) | RECTAL | Status: DC | PRN

## 2015-07-09 MED ORDER — METHOCARBAMOL 500 MG PO TABS: 500.0000 mg | ORAL_TABLET | Freq: Four times a day (QID) | ORAL | Status: DC | PRN

## 2015-07-09 MED ORDER — PHENOL 1.4 % MT LIQD
1.0000 | OROMUCOSAL | Status: DC | PRN
  Filled 2015-07-09: qty 177

## 2015-07-09 MED ORDER — DIAZEPAM 5 MG PO TABS: 5.0000 mg | ORAL_TABLET | Freq: Four times a day (QID) | ORAL | Status: DC | PRN

## 2015-07-09 MED ORDER — GLUCERNA SHAKE PO LIQD
237.0000 mL | Freq: Two times a day (BID) | ORAL | Status: DC
  Administered 2015-07-10 – 2015-07-12 (×5): 237 mL via ORAL
  Filled 2015-07-09 (×6): qty 237

## 2015-07-09 MED ORDER — ONDANSETRON HCL 4 MG/2ML IJ SOLN
INTRAMUSCULAR | Status: DC | PRN
  Administered 2015-07-09: 4 mg via INTRAVENOUS

## 2015-07-09 MED ORDER — PROPOFOL 10 MG/ML IV BOLUS
INTRAVENOUS | Status: AC
  Filled 2015-07-09: qty 20

## 2015-07-09 MED ORDER — LACTATED RINGERS IV SOLN
INTRAVENOUS | Status: DC | PRN
  Administered 2015-07-09: 18:00:00 via INTRAVENOUS

## 2015-07-09 MED ORDER — ASPIRIN EC 325 MG PO TBEC
325.0000 mg | DELAYED_RELEASE_TABLET | Freq: Every day | ORAL | Status: DC
  Administered 2015-07-10 – 2015-07-12 (×3): 325 mg via ORAL
  Filled 2015-07-09 (×4): qty 1

## 2015-07-09 MED ORDER — DEXAMETHASONE SODIUM PHOSPHATE 10 MG/ML IJ SOLN
10.0000 mg | Freq: Once | INTRAMUSCULAR | Status: AC
  Administered 2015-07-09: 10 mg via INTRAVENOUS

## 2015-07-09 MED ORDER — MENTHOL 3 MG MT LOZG: 1.0000 | LOZENGE | OROMUCOSAL | Status: DC | PRN

## 2015-07-09 MED ORDER — DIPHENHYDRAMINE HCL 12.5 MG/5ML PO ELIX: 12.5000 mg | ORAL_SOLUTION | ORAL | Status: DC | PRN

## 2015-07-09 MED ORDER — FLEET ENEMA 7-19 GM/118ML RE ENEM: 1.0000 | ENEMA | Freq: Once | RECTAL | Status: DC | PRN

## 2015-07-09 MED ORDER — TRAMADOL HCL 50 MG PO TABS
50.0000 mg | ORAL_TABLET | Freq: Four times a day (QID) | ORAL | Status: DC | PRN
Start: 2015-07-09 — End: 2015-07-12
  Administered 2015-07-09 – 2015-07-11 (×3): 100 mg via ORAL
  Filled 2015-07-09 (×3): qty 2

## 2015-07-09 MED ORDER — CEFAZOLIN SODIUM-DEXTROSE 2-3 GM-% IV SOLR
2.0000 g | INTRAVENOUS | Status: AC
  Administered 2015-07-09: 2 g via INTRAVENOUS

## 2015-07-09 MED ORDER — ONDANSETRON HCL 4 MG/2ML IJ SOLN: 4.0000 mg | Freq: Four times a day (QID) | INTRAMUSCULAR | Status: DC | PRN

## 2015-07-09 MED ORDER — HYDROCODONE-ACETAMINOPHEN 5-325 MG PO TABS
1.0000 | ORAL_TABLET | ORAL | Status: DC | PRN
  Administered 2015-07-09: 2 via ORAL
  Administered 2015-07-10: 1 via ORAL
  Administered 2015-07-10 – 2015-07-11 (×4): 2 via ORAL
  Filled 2015-07-09 (×6): qty 2

## 2015-07-09 MED ORDER — DEXAMETHASONE SODIUM PHOSPHATE 10 MG/ML IJ SOLN
INTRAMUSCULAR | Status: AC
  Filled 2015-07-09: qty 1

## 2015-07-09 MED ORDER — METHOCARBAMOL 500 MG PO TABS
500.0000 mg | ORAL_TABLET | Freq: Four times a day (QID) | ORAL | Status: DC | PRN
  Administered 2015-07-10 – 2015-07-11 (×2): 500 mg via ORAL
  Filled 2015-07-09 (×2): qty 1

## 2015-07-09 MED ORDER — VENLAFAXINE HCL ER 75 MG PO CP24
75.0000 mg | ORAL_CAPSULE | Freq: Every day | ORAL | Status: DC
  Administered 2015-07-10 – 2015-07-12 (×3): 75 mg via ORAL
  Filled 2015-07-09 (×4): qty 1

## 2015-07-09 MED ORDER — DEXAMETHASONE SODIUM PHOSPHATE 10 MG/ML IJ SOLN
10.0000 mg | Freq: Once | INTRAMUSCULAR | Status: AC
  Administered 2015-07-10: 10 mg via INTRAVENOUS
  Filled 2015-07-09: qty 1

## 2015-07-09 MED ORDER — FENTANYL CITRATE (PF) 100 MCG/2ML IJ SOLN
25.0000 ug | INTRAMUSCULAR | Status: DC | PRN
  Administered 2015-07-09 (×2): 25 ug via INTRAVENOUS

## 2015-07-09 MED ORDER — POLYETHYLENE GLYCOL 3350 17 G PO PACK: 17.0000 g | PACK | Freq: Every day | ORAL | Status: DC | PRN

## 2015-07-09 MED ORDER — METHOCARBAMOL 1000 MG/10ML IJ SOLN
500.0000 mg | Freq: Four times a day (QID) | INTRAVENOUS | Status: DC | PRN
  Administered 2015-07-09: 500 mg via INTRAVENOUS
  Filled 2015-07-09 (×2): qty 5

## 2015-07-09 SURGICAL SUPPLY — 46 items
BAG ZIPLOCK 12X15 (MISCELLANEOUS) ×3 IMPLANT
BANDAGE ELASTIC 6 VELCRO ST LF (GAUZE/BANDAGES/DRESSINGS) ×3 IMPLANT
BANDAGE ESMARK 6X9 LF (GAUZE/BANDAGES/DRESSINGS) ×1 IMPLANT
BNDG ESMARK 6X9 LF (GAUZE/BANDAGES/DRESSINGS) ×3
BNDG GAUZE ELAST 4 BULKY (GAUZE/BANDAGES/DRESSINGS) ×3 IMPLANT
CLOSURE WOUND 1/2 X4 (GAUZE/BANDAGES/DRESSINGS) ×2
CUFF TOURN SGL QUICK 34 (TOURNIQUET CUFF) ×2
CUFF TRNQT CYL 34X4X40X1 (TOURNIQUET CUFF) ×1 IMPLANT
DRAPE EXTREMITY T 121X128X90 (DRAPE) ×3 IMPLANT
DRAPE POUCH INSTRU U-SHP 10X18 (DRAPES) ×3 IMPLANT
DRAPE U-SHAPE 47X51 STRL (DRAPES) ×3 IMPLANT
DRSG ADAPTIC 3X8 NADH LF (GAUZE/BANDAGES/DRESSINGS) ×3 IMPLANT
DRSG EMULSION OIL 3X16 NADH (GAUZE/BANDAGES/DRESSINGS) ×3 IMPLANT
DRSG PAD ABDOMINAL 8X10 ST (GAUZE/BANDAGES/DRESSINGS) ×3 IMPLANT
DURAPREP 26ML APPLICATOR (WOUND CARE) ×3 IMPLANT
ELECT REM PT RETURN 9FT ADLT (ELECTROSURGICAL) ×3
ELECTRODE REM PT RTRN 9FT ADLT (ELECTROSURGICAL) ×1 IMPLANT
EVACUATOR 1/8 PVC DRAIN (DRAIN) ×3 IMPLANT
GAUZE SPONGE 4X4 12PLY STRL (GAUZE/BANDAGES/DRESSINGS) ×3 IMPLANT
GLOVE BIO SURGEON STRL SZ7.5 (GLOVE) ×3 IMPLANT
GLOVE BIO SURGEON STRL SZ8 (GLOVE) ×6 IMPLANT
GLOVE BIOGEL PI IND STRL 8 (GLOVE) ×2 IMPLANT
GLOVE BIOGEL PI INDICATOR 8 (GLOVE) ×4
GLOVE ECLIPSE 8.0 STRL XLNG CF (GLOVE) IMPLANT
GOWN STRL REUS W/TWL LRG LVL3 (GOWN DISPOSABLE) ×3 IMPLANT
GOWN STRL REUS W/TWL XL LVL3 (GOWN DISPOSABLE) ×3 IMPLANT
HANDPIECE INTERPULSE COAX TIP (DISPOSABLE) ×2
IMMOBILIZER KNEE 22 UNIV (SOFTGOODS) ×3 IMPLANT
KIT BASIN OR (CUSTOM PROCEDURE TRAY) ×3 IMPLANT
MANIFOLD NEPTUNE II (INSTRUMENTS) ×3 IMPLANT
PACK TOTAL JOINT (CUSTOM PROCEDURE TRAY) ×3 IMPLANT
PAD ABD 8X10 STRL (GAUZE/BANDAGES/DRESSINGS) ×3 IMPLANT
PADDING CAST ABS 6INX4YD NS (CAST SUPPLIES) ×2
PADDING CAST ABS COTTON 6X4 NS (CAST SUPPLIES) ×1 IMPLANT
PADDING CAST COTTON 6X4 STRL (CAST SUPPLIES) ×6 IMPLANT
POSITIONER SURGICAL ARM (MISCELLANEOUS) ×3 IMPLANT
SET HNDPC FAN SPRY TIP SCT (DISPOSABLE) ×1 IMPLANT
STAPLER VISISTAT 35W (STAPLE) ×3 IMPLANT
STRIP CLOSURE SKIN 1/2X4 (GAUZE/BANDAGES/DRESSINGS) ×4 IMPLANT
SUT MNCRL AB 4-0 PS2 18 (SUTURE) IMPLANT
SUT VIC AB 2-0 CT1 27 (SUTURE)
SUT VIC AB 2-0 CT1 TAPERPNT 27 (SUTURE) IMPLANT
SUT VLOC 180 0 24IN GS25 (SUTURE) ×3 IMPLANT
SWAB COLLECTION DEVICE MRSA (MISCELLANEOUS) ×3 IMPLANT
TOWEL OR 17X26 10 PK STRL BLUE (TOWEL DISPOSABLE) ×6 IMPLANT
TUBE ANAEROBIC SPECIMEN COL (MISCELLANEOUS) ×3 IMPLANT

## 2015-07-09 NOTE — Anesthesia Postprocedure Evaluation (Signed)
  Anesthesia Post-op Note  Patient: Jared Bridges  Procedure(s) Performed: Procedure(s) (LRB): LEFT KNEE IRRIGATION AND DEBRIDEMENT WOUND CLOSURE (Left)  Patient Location: PACU  Anesthesia Type: General  Level of Consciousness: awake and alert   Airway and Oxygen Therapy: Patient Spontanous Breathing  Post-op Pain: mild  Post-op Assessment: Post-op Vital signs reviewed, Patient's Cardiovascular Status Stable, Respiratory Function Stable, Patent Airway and No signs of Nausea or vomiting  Last Vitals:  Filed Vitals:   07/09/15 2045  BP: 143/82  Pulse: 83  Temp:   Resp: 19    Post-op Vital Signs: stable   Complications: No apparent anesthesia complications

## 2015-07-09 NOTE — Anesthesia Preprocedure Evaluation (Signed)
Anesthesia Evaluation  Patient identified by MRN, date of birth, ID band Patient awake    Reviewed: Allergy & Precautions, NPO status , Patient's Chart, lab work & pertinent test results  History of Anesthesia Complications Negative for: history of anesthetic complications  Airway Mallampati: II  TM Distance: >3 FB Neck ROM: Full    Dental no notable dental hx. (+) Teeth Intact, Dental Advisory Given   Pulmonary former smoker,    Pulmonary exam normal       Cardiovascular hypertension, Normal cardiovascular exam    Neuro/Psych  Headaches, PSYCHIATRIC DISORDERS Depression  Neuromuscular disease    GI/Hepatic Neg liver ROS, PUD,   Endo/Other  diabetes, Well Controlled, Type 2, Oral Hypoglycemic Agents  Renal/GU Renal diseaseChronic kidney disease     Musculoskeletal   Abdominal   Peds  Hematology   Anesthesia Other Findings   Reproductive/Obstetrics                             Anesthesia Physical Anesthesia Plan  ASA: III  Anesthesia Plan: General   Post-op Pain Management:    Induction: Intravenous  Airway Management Planned: LMA  Additional Equipment:   Intra-op Plan:   Post-operative Plan:   Informed Consent:   Plan Discussed with: Surgeon  Anesthesia Plan Comments:         Anesthesia Quick Evaluation

## 2015-07-09 NOTE — Transfer of Care (Signed)
Immediate Anesthesia Transfer of Care Note  Patient: Jared Bridges  Procedure(s) Performed: Procedure(s): LEFT KNEE IRRIGATION AND DEBRIDEMENT WOUND CLOSURE (Left)  Patient Location: PACU  Anesthesia Type:General  Level of Consciousness:  sedated, patient cooperative and responds to stimulation  Airway & Oxygen Therapy:Patient Spontanous Breathing and Patient connected to face mask oxgen  Post-op Assessment:  Report given to PACU RN and Post -op Vital signs reviewed and stable  Post vital signs:  Reviewed and stable  Last Vitals:  Filed Vitals:   07/09/15 1638  BP: 107/90  Pulse: 76  Temp: 36.9 C  Resp: 16    Complications: No apparent anesthesia complications

## 2015-07-09 NOTE — Brief Op Note (Signed)
07/09/2015  7:56 PM  PATIENT:  Jared Bridges  81 y.o. male  PRE-OPERATIVE DIAGNOSIS:  LEFT KNEE WOUND DEHISCENCE  POST-OPERATIVE DIAGNOSIS:  left knee wound dehiscence   PROCEDURE:  Procedure(s): LEFT KNEE IRRIGATION AND DEBRIDEMENT WOUND CLOSURE (Left)  SURGEON:  Surgeon(s) and Role:    Gaynelle Arabian, MD - Primary  PHYSICIAN ASSISTANT:   ASSISTANTS: none   ANESTHESIA:   general  EBL:     BLOOD ADMINISTERED:none  DRAINS: (Medium) Hemovact drain(s) in the left knee  with  Suction Open   LOCAL MEDICATIONS USED:  MARCAINE     COUNTS:  YES  TOURNIQUET:   Total Tourniquet Time Documented: Thigh (Left) - 23 minutes Total: Thigh (Left) - 23 minutes   DICTATION: .Other Dictation: Dictation Number [id number]  PLAN OF CARE: Admit to inpatient   PATIENT DISPOSITION:  PACU - hemodynamically stable.

## 2015-07-09 NOTE — H&P (Signed)
CC- Jared Bridges is a 81 y.o. male who presents with central wound dehiscence right knee  HPI- . Knee Pain: Patient presents with drainage and superficial dehiscence from his right knee wound. He had a Total Knee Arthroplasty on 6/27 and developed blisters post-operatively from the Steri-strips. He then improved for 3 weeks but developed cellulitis approximately 10-14 days ago. He was hospitalized at [facility] and placed on IV antibiotics with improvement in his cellulitis. I saw him in the office 3 days ago and he had minimal cellulitis but had serous drainage from a superficial wound breakdown. He has not had any fever, chills or systemic symptoms of infection. It appears that all of the drainage was coming from the subcutaneous space. He presents now for irrigation and debridement and wound closure.  Past Medical History  Diagnosis Date  . Chronic headaches   . Type 2 diabetes mellitus   . Essential hypertension   . Diverticulitis   . Syncope   . Hypercholesteremia   . GI bleed     Dr. Laural Golden - 1998  . Peptic ulcer disease   . Anemia, iron deficiency   . Depression   . Chronic kidney disease   . Peripheral neuropathy     Past Surgical History  Procedure Laterality Date  . Uvulectomy  2004  . Spinal fusion    . Spinal fusion    . Colon surgery      FOR DIVERTICULOSIS  . Tonsillectomy    . Eye surgery      right cataract with lens implant  . Appendectomy      as child  . Hernia repair      1-right inguinal, 3- left inguinal  . Total knee arthroplasty Left 05/28/2015    Procedure: LEFT TOTAL KNEE ARTHROPLASTY;  Surgeon: Gaynelle Arabian, MD;  Location: [facility];  Service: Orthopedics;  Laterality: Left;    Prior to Admission medications   Medication Sig Start Date End Date Taking? Authorizing Provider  clonazePAM (KLONOPIN) 1 MG tablet Take 1 tablet (1 mg total) by mouth 2 (two) times daily. Patient taking differently: Take 1 mg by mouth 2 (two) times daily as needed for  anxiety (sleep).  04/03/15 04/02/16 Yes Cloria Spring, MD  diazepam (VALIUM) 5 MG tablet Take 5 mg by mouth every 6 (six) hours as needed for anxiety (and/sleep).    Yes Historical Provider, MD  diclofenac sodium (VOLTAREN) 1 % GEL Apply 4 g topically daily as needed (for pain).   Yes Historical Provider, MD  divalproex (DEPAKOTE) 250 MG DR tablet Take 1 tablet (250 mg total) by mouth Nightly. Patient taking differently: Take 250 mg by mouth every morning.  05/22/15  Yes Larey Seat, MD  doxycycline (VIBRA-TABS) 100 MG tablet Take 1 tablet (100 mg total) by mouth 2 (two) times daily. 06/30/15  Yes Erline Hau, MD  feeding supplement, GLUCERNA SHAKE, (GLUCERNA SHAKE) LIQD Take 237 mLs by mouth 2 (two) times daily between meals. 08/18/14  Yes Lezlie Octave Black, NP  glimepiride (AMARYL) 1 MG tablet Take 1 mg by mouth daily as needed (if blood sugar is above 150).    Yes Historical Provider, MD  HYDROcodone-acetaminophen (NORCO/VICODIN) 5-325 MG per tablet Take 0.5-1 tablets by mouth every 6 (six) hours as needed for moderate pain.   Yes Historical Provider, MD  LYRICA 50 MG capsule Take 1 capsule by mouth at bedtime.  10/03/14  Yes Historical Provider, MD  methocarbamol (ROBAXIN) 500 MG tablet Take 1 tablet (  500 mg total) by mouth every 6 (six) hours as needed for muscle spasms. 05/29/15  Yes Arlee Muslim, PA-C  oxyCODONE (OXY IR/ROXICODONE) 5 MG immediate release tablet Take 1-2 tablets (5-10 mg total) by mouth every 3 (three) hours as needed for moderate pain or severe pain. 05/29/15  Yes Arlee Muslim, PA-C  pantoprazole (PROTONIX) 40 MG tablet Take 1 tablet (40 mg total) by mouth daily. 08/18/14  Yes Lezlie Octave Black, NP  predniSONE (DELTASONE) 5 MG tablet Take 5 mg by mouth every morning.  10/03/14  Yes Historical Provider, MD  traMADol (ULTRAM) 50 MG tablet Take 1-2 tablets (50-100 mg total) by mouth every 6 (six) hours as needed (mild pain). 05/29/15  Yes Arlee Muslim, PA-C  venlafaxine (EFFEXOR)  37.5 MG tablet Take 112.5 mg by mouth at bedtime.   Yes Historical Provider, MD  venlafaxine XR (EFFEXOR-XR) 75 MG 24 hr capsule Take 75 mg by mouth daily with breakfast.   Yes Historical Provider, MD  rivaroxaban (XARELTO) 10 MG TABS tablet Take 1 tablet (10 mg total) by mouth daily with breakfast. Take Xarelto for two and a half more weeks, then discontinue Xarelto. Once the patient has completed the blood thinner regimen, then take a Baby 81 mg Aspirin daily for three more weeks. Patient not taking: Reported on 06/29/2015 05/29/15   Arlee Muslim, PA-C    Left Knee- Minimal swelling; moderate bruising; small 5 x 5 mm open area mid-incision and 2 tiny pinhole openings above that; ROM 5-100; no erythema  Physical Examination: General appearance - alert, well appearing, and in no distress Mental status - alert, oriented to person, place, and time Chest - clear to auscultation, no wheezes, rales or rhonchi, symmetric air entry Heart - normal rate, regular rhythm, normal S1, S2, no murmurs, rubs, clicks or gallops Abdomen - soft, nontender, nondistended, no masses or organomegaly Neurological - alert, oriented, normal speech, no focal findings or movement disorder noted   Asessment/Plan--- Left knee superficial wound dehiscence- - Plan irrigation and debridement left knee. Procedure risks and potential comps discussed with patient who elects to proceed. Goals are decreased pain and increased function with a high likelihood of achieving both

## 2015-07-09 NOTE — Interval H&P Note (Signed)
History and Physical Interval Note:  07/09/2015 6:30 PM  Jared Bridges  has presented today for surgery, with the diagnosis of LEFT KNEE WOUND DEHISCENCE  The various methods of treatment have been discussed with the patient and family. After consideration of risks, benefits and other options for treatment, the patient has consented to  Procedure(s): [place] (Left) as a surgical intervention .  The patient's history has been reviewed, patient examined, no change in status, stable for surgery.  I have reviewed the patient's chart and labs.  Questions were answered to the patient's satisfaction.     [name]

## 2015-07-09 NOTE — Anesthesia Procedure Notes (Signed)
Procedure Name: LMA Insertion Date/Time: 07/09/2015 7:01 PM Performed by: Lajuana Carry E Pre-anesthesia Checklist: Patient identified, Emergency Drugs available, Suction available and Patient being monitored Patient Re-evaluated:Patient Re-evaluated prior to inductionOxygen Delivery Method: Circle system utilized Preoxygenation: Pre-oxygenation with 100% oxygen Intubation Type: IV induction Ventilation: Mask ventilation without difficulty LMA: LMA inserted LMA Size: 4.0 Number of attempts: 1 Placement Confirmation: positive ETCO2 and breath sounds checked- equal and bilateral Dental Injury: Teeth and Oropharynx as per pre-operative assessment

## 2015-07-10 ENCOUNTER — Encounter (HOSPITAL_COMMUNITY): Payer: Self-pay | Admitting: Orthopedic Surgery

## 2015-07-10 ENCOUNTER — Telehealth (HOSPITAL_COMMUNITY): Payer: Self-pay | Admitting: *Deleted

## 2015-07-10 LAB — CBC
HEMATOCRIT: 34.5 % — AB (ref 39.0–52.0)
Hemoglobin: 11.1 g/dL — ABNORMAL LOW (ref 13.0–17.0)
MCH: 31.4 pg (ref 26.0–34.0)
MCHC: 32.2 g/dL (ref 30.0–36.0)
MCV: 97.7 fL (ref 78.0–100.0)
PLATELETS: 265 10*3/uL (ref 150–400)
RBC: 3.53 MIL/uL — ABNORMAL LOW (ref 4.22–5.81)
RDW: 13.6 % (ref 11.5–15.5)
WBC: 10 10*3/uL (ref 4.0–10.5)

## 2015-07-10 LAB — BASIC METABOLIC PANEL
Anion gap: 6 (ref 5–15)
BUN: 16 mg/dL (ref 6–20)
CO2: 27 mmol/L (ref 22–32)
CREATININE: 1.05 mg/dL (ref 0.61–1.24)
Calcium: 8.5 mg/dL — ABNORMAL LOW (ref 8.9–10.3)
Chloride: 101 mmol/L (ref 101–111)
GFR calc Af Amer: >60 mL/min (ref >60)
GFR calc non Af Amer: >60 mL/min (ref >60)
Glucose, Bld: 259 mg/dL — ABNORMAL HIGH (ref 65–99)
Potassium: 4.8 mmol/L (ref 3.5–5.1)
Sodium: 134 mmol/L — ABNORMAL LOW (ref 135–145)

## 2015-07-10 LAB — GLUCOSE, CAPILLARY
GLUCOSE-CAPILLARY: 223 mg/dL — AB (ref 65–99)
GLUCOSE-CAPILLARY: 222 mg/dL — AB (ref 65–99)

## 2015-07-10 MED ORDER — INSULIN ASPART 100 UNIT/ML ~~LOC~~ SOLN
0.0000 [IU] | Freq: Three times a day (TID) | SUBCUTANEOUS | Status: DC
  Administered 2015-07-10: 3 [IU] via SUBCUTANEOUS
  Administered 2015-07-11: 2 [IU] via SUBCUTANEOUS

## 2015-07-10 NOTE — Progress Notes (Addendum)
   07/10/15 1500  PT Visit Information  Last PT Received On 07/10/15  Spoke with family regarding their concerns about pt mobility, specifically stairs--reviewed going up once and down once with pt as well; pt verbalizes understanding and in agreement (although family not present); Family reports to PT that reinforcement from Dr. Wynelle Link would be helpful also   (regarding limiting up/down stairs and home safety)  Assistance Needed +1  History of Present Illness s/p L knee I and D; PMHx: L TKA in June 2016, HTN, spinal fusions  PT Time Calculation  PT Start Time (ACUTE ONLY) 1531  PT Stop Time (ACUTE ONLY) 1600  PT Time Calculation (min) (ACUTE ONLY) 29 min  Subjective Data  Patient Stated Goal knee healed  Precautions  Precautions Fall  Precaution Comments NO ROM L knee  Required Braces or Orthoses Knee Immobilizer - Left  Knee Immobilizer - Left On at all times  Restrictions  Weight Bearing Restrictions No  Other Position/Activity Restrictions WBAT  Pain Assessment  Pain Assessment 0-10  Pain Score 8  Pain Location L knee  Pain Descriptors / Indicators Aching  Pain Intervention(s) Limited activity within patient's tolerance;Monitored during session;Premedicated before session;Repositioned;Ice applied Investment banker, corporate made aware of pain level)  Cognition  Arousal/Alertness Awake/alert  Behavior During Therapy WFL for tasks assessed/performed  Overall Cognitive Status Within Functional Limits for tasks assessed  Bed Mobility  Overal bed mobility Needs Assistance  Bed Mobility Sit to Supine  Sit to supine Min assist  General bed mobility comments pt on EOB  Transfers  Overall transfer level Needs assistance  Equipment used Rolling walker (2 wheeled)  Transfers Sit to/from Stand  Sit to Stand Min guard;Supervision  General transfer comment cues for hand placement and LLE position; gait deferred d/t pain level  General Exercises - Lower Extremity  Ankle Circles/Pumps AROM;10 reps;Both   Quad Sets AROM;Both;10 reps  Hip ABduction/ADduction AAROM;Left;10 reps  Straight Leg Raises AAROM;Left;10 reps  PT - End of Session  Activity Tolerance Patient limited by pain  Patient left in bed;with call bell/phone within reach  Nurse Communication Mobility status  PT - Assessment/Plan  PT Plan Current plan remains appropriate  PT Frequency (ACUTE ONLY) Min 6X/week  Follow Up Recommendations Home health PT;No PT follow up  PT equipment None recommended by PT  PT Goal Progression  Progress towards PT goals Progressing toward goals  Acute Rehab PT Goals  PT Goal Formulation With patient  Time For Goal Achievement 07/17/15  Potential to Achieve Goals Good  PT General Charges  $$ ACUTE PT VISIT 1 Procedure  PT Treatments  $Therapeutic Exercise 8-22 mins

## 2015-07-10 NOTE — Op Note (Signed)
NAME:  Jared Bridges, Jared Bridges NO.:  [id number]  MEDICAL RECORD NO.:  [id number]  LOCATION:  3536                         FACILITY:  [facility]  PHYSICIAN:  Gaynelle Arabian, M.D.    DATE OF BIRTH:  1929-12-06  DATE OF PROCEDURE:  07/09/2015 DATE OF DISCHARGE:                              OPERATIVE REPORT   PREOPERATIVE DIAGNOSIS:  Left knee wound dehiscence.  POSTOP DIAGNOSIS:  Left knee wound dehiscence.  PROCEDURE:  Left knee irrigation and debridement with closure of the wound.  SURGEON:  Gaynelle Arabian, MD  ASSISTANT:  No assistant.  ANESTHESIA:  General.  ESTIMATED BLOOD LOSS:  Minimal.  DRAINS:  Hemovac x1.  TOURNIQUET TIME:  Twenty three minutes at 300 mmHg.  COMPLICATIONS:  None.  CONDITION:  Stable to recovery.  BRIEF CLINICAL NOTE:  [name] is an 81 year old male who had a left total knee arthroplasty performed on May 28, 2015.  He did well initially, but then developed blisters from the [place].  He has significant swelling.  It took a while for the wound to heal. Approximately 10 days to 2 weeks ago, developed cellulitis.  I saw him in the office approximately 3 days ago, noted that there was an area that the wound did open superficially and there was serous drainage.  He was not having fever, chills, or systemic symptoms.  We elected to take him to surgery today to perform irrigation and debridement of the wound.  PROCEDURE IN DETAIL:  After successful administration of general anesthetic, a tourniquet was placed high on his left thigh and his left lower extremity was prepped and draped in the usual sterile fashion. Extremities were wrapped in Esmarch, tourniquet inflated to 300 mmHg.  A midline incision was made with a 10 blade, ellipsing out the 5 x 5 mm open area to the central aspect of the wound.  I then created subcutaneous flaps.  Unfortunately, it appeared that superomedially, there was a communication with the joint.  Thus, I had  to take a fresh blade to make a medial arthrotomy.  Fortunately, there was just a small amount of fluid in the joint.  We sent this for a Gram stain, culture and sensitivity, and aerobes and anaerobic cultures.  I then thoroughly irrigated the joint with 3 L of saline.  I then performed debridement of any abnormal tissue, but fortunately they just appeared to be hematoma in the joint and there were not any necrotic tissues.  We thoroughly debrided and then back to normal-appearing tissue and then irrigated with another 3 L of saline.  I then closed the arthrotomy with a running #1 V-Loc suture.  Flexion against gravity was about 110 degrees. Tourniquet was then released for total time of 23 minutes.  Minor bleeding was stopped with cautery.  Subcu was then closed with interrupted 2-0 Vicryl.  A drain was hooked to suction and was activated.  Skin was then closed with staples.  Fortunately, the skin was closed with no tension and the appearance of the skin was much improved after the closure.  A bulky sterile dressing was then applied. He was placed in a knee immobilizer, awakened and transported to recovery in  stable condition.     Gaynelle Arabian, M.D.     FA/MEDQ  D:  07/09/2015  T:  07/10/2015  Job:  [id number]

## 2015-07-10 NOTE — Care Management Note (Signed)
Case Management Note  Patient Details  Name: Jared Bridges MRN: [id number] Date of Birth: 08/27/1930  Subjective/Objective:                   LEFT KNEE IRRIGATION AND DEBRIDEMENT WOUND CLOSURE (Left) Action/Plan: Discharge planning  Expected Discharge Date:  07/12/15               Expected Discharge Plan:  [place]  In-House Referral:     Discharge planning Services  CM Consult  Post Acute Care Choice:  Home Health, Resumption of Svcs/PTA Provider Choice offered to:     DME Arranged:  IV pump/equipment DME Agency:  [place]:  RN, PT [facility] Agency:  [place]  Status of Service:  In process, will continue to follow  Medicare Important Message Given:    Date Medicare IM Given:    Medicare IM give by:    Date Additional Medicare IM Given:    Additional Medicare Important Message give by:     If discussed at [place] of Stay Meetings, dates discussed:    Additional Comments: CM notes pt has had [facility] for PT and has contacted [facility] rep, Kristen for probable home IV ABX.  Request for [facility] orders made.  Will monitor for progress. Dellie Catholic, RN 07/10/2015, 9:35 AM

## 2015-07-10 NOTE — Progress Notes (Signed)
OT Note  Patient Details Name: Jared Bridges MRN: [id number] DOB: 1930/09/17   Cancelled Treatment:    Reason Eval/Treat Not Completed: OT screened, no needs identified, will sign off  [name] A 07/10/2015, 11:41 AM

## 2015-07-10 NOTE — Progress Notes (Signed)
   Subjective: 1 Day Post-Op Procedure(s) (LRB): LEFT KNEE IRRIGATION AND DEBRIDEMENT WOUND CLOSURE (Left) Patient reports pain as mild.   Patient seen in rounds with Dr. Wynelle Link. Patient is well, but has had some minor complaints of pain in the knee, requiring pain medications We will start therapy today.  NO BENDING OF KNEE FOR TWO WEEKS.  May be WBAT when up ambulating. Plan is to go Home after hospital stay.  Objective: Vital signs in last 24 hours: Temp:  [97.8 F (36.6 C)-98.5 F (36.9 C)] 98.3 F (36.8 C) (08/09 0438) Pulse Rate:  [76-86] 79 (08/09 0438) Resp:  [15-21] 16 (08/09 0438) BP: (107-153)/(46-94) 145/46 mmHg (08/09 0438) SpO2:  [97 %-100 %] 98 % (08/09 0438) Weight:  [91.627 kg (202 lb)] 91.627 kg (202 lb) (08/08 1653)  Intake/Output from previous day: 08/08 0701 - 08/09 0700 In: 2010 [P.O.:360; I.V.:1550; IV Piggyback:100] Out: 1250 [Urine:1200; Drains:50]   Recent Labs  07/10/15 0515  HGB 11.1*    Recent Labs  07/10/15 0515  WBC 10.0  RBC 3.53*  HCT 34.5*  PLT 265    Recent Labs  07/10/15 0515  NA 134*  K 4.8  CL 101  CO2 27  BUN 16  CREATININE 1.05  GLUCOSE 259*  CALCIUM 8.5*   No results for input(s): LABPT, INR in the last 72 hours.  EXAM General - Patient is Alert and Appropriate Extremity - Neurovascular intact Sensation intact distally Dressing - dressing C/D/I Motor Function - intact, moving foot and toes well on exam.   Past Medical History  Diagnosis Date  . Chronic headaches   . Type 2 diabetes mellitus   . Essential hypertension   . Diverticulitis   . Syncope   . Hypercholesteremia   . GI bleed     Dr. Laural Golden - 1998  . Peptic ulcer disease   . Anemia, iron deficiency   . Depression   . Chronic kidney disease   . Peripheral neuropathy     Assessment/Plan: 1 Day Post-Op Procedure(s) (LRB): LEFT KNEE IRRIGATION AND DEBRIDEMENT WOUND CLOSURE (Left) Active Problems:   Postoperative wound  dehiscence  Estimated body mass index is 25.92 kg/(m^2) as calculated from the following:   Height as of this encounter: 6\' 2"  (1.88 m).   Weight as of this encounter: 91.627 kg (202 lb). Up with therapy Continue ABX IV ANCEF therapy due to Culture taken of surgical site during I&D procedure. Cultures pending at this time PICC line ordered Will need HH for IV ABX - ABX and duration to be determined based on OR cultures  DVT Prophylaxis - Aspirin Weight-Bearing as tolerated to left leg D/C O2 and Pulse OX and try on Room Air Drain left in place  NO BENDING OF KNEE FOR TWO WEEKS.   Arlee Muslim, PA-C Orthopaedic Surgery 07/10/2015, 8:22 AM

## 2015-07-10 NOTE — Evaluation (Signed)
Physical Therapy Evaluation Patient Details Name: Jared Bridges MRN: [id number] DOB: [birth year] Today's Date: 07/10/2015   History of Present Illness  s/p L knee I and D; PMHx: L TKA in June 2016, HTN, spinal fusions  Clinical Impression  Pt admitted with above diagnosis. Pt currently with functional limitations due to the deficits listed below (see PT Problem List).  Pt will benefit from skilled PT to increase their independence and safety with mobility to allow discharge to the venue listed below.       Follow Up Recommendations Home health PT;No PT follow up (depending on progress)    Equipment Recommendations  None recommended by PT    Recommendations for Other Services       Precautions / Restrictions Precautions Precautions: Fall Precaution Comments: NO ROM L knee Required Braces or Orthoses: Knee Immobilizer - Left Knee Immobilizer - Left: On at all times Restrictions Weight Bearing Restrictions: No Other Position/Activity Restrictions: WBAT      Mobility  Bed Mobility               General bed mobility comments: pt on EOB  Transfers Overall transfer level: Needs assistance Equipment used: Rolling walker (2 wheeled) Transfers: Sit to/from Stand Sit to Stand: Min assist         General transfer comment: cues for hand placement and LLE position  Ambulation/Gait Ambulation/Gait assistance: Min guard;Min assist Ambulation Distance (Feet): 55 Feet Assistive device: Rolling walker (2 wheeled) Gait Pattern/deviations: Step-to pattern;Trunk flexed;Decreased step length - left;Decreased stance time - right     General Gait Details: cues for posture, position from RW/step length, slight posterior LOB x3 with min/min/guard recovery (during initial 20')  Stairs            Wheelchair Mobility    Modified Rankin (Stroke Patients Only)       Balance                                             Pertinent Vitals/Pain Pain  Assessment: 0-10 Pain Score: 3  Pain Location: L knee Pain Descriptors / Indicators: Sore Pain Intervention(s): Limited activity within patient's tolerance;Monitored during session;Repositioned;Ice applied    Home Living Family/patient expects to be discharged to:: Private residence Living Arrangements: Spouse/significant other;Children Available Help at Discharge: Family;Available 24 hours/day Type of Home: House Home Access: Stairs to enter Entrance Stairs-Rails: None Entrance Stairs-Number of Steps: 2 Home Layout: Two level;Laundry or work area in basement   Additional Comments: uses cane and rail to get up to second level bedroom; RW bed to bathroom when upstairs; uses cane when going out and when downstairs in home    Prior Function Level of Independence: Independent with assistive device(s)               Hand Dominance        Extremity/Trunk Assessment   Upper Extremity Assessment: Overall WFL for tasks assessed           Lower Extremity Assessment: LLE deficits/detail   LLE Deficits / Details: ankle WFL     Communication      Cognition Arousal/Alertness: Awake/alert   Overall Cognitive Status: Within Functional Limits for tasks assessed                      General Comments      Exercises  Assessment/Plan    PT Assessment Patient needs continued PT services  PT Diagnosis Difficulty walking   PT Problem List Decreased strength;Decreased balance;Decreased mobility;Decreased activity tolerance;Decreased knowledge of use of DME  PT Treatment Interventions DME instruction;Gait training;Functional mobility training;Therapeutic activities;Stair training;Therapeutic exercise;Patient/family education   PT Goals (Current goals can be found in the Care Plan section) Acute Rehab PT Goals Patient Stated Goal: knee healed PT Goal Formulation: With patient Time For Goal Achievement: 07/17/15 Potential to Achieve Goals: Good    Frequency  Min 6X/week   Barriers to discharge        Co-evaluation               End of Session Equipment Utilized During Treatment: Gait belt Activity Tolerance: Patient tolerated treatment well Patient left: in chair;with call bell/phone within reach Nurse Communication: Mobility status         Time: 1030-1058 PT Time Calculation (min) (ACUTE ONLY): 28 min   Charges:   PT Evaluation $Initial PT Evaluation Tier I: 1 Procedure PT Treatments $Gait Training: 8-22 mins   PT G Codes:        Khamauri Bauernfeind 08/03/2015, 11:07 AM

## 2015-07-11 LAB — BASIC METABOLIC PANEL
Anion gap: 7 (ref 5–15)
BUN: 20 mg/dL (ref 6–20)
CHLORIDE: 102 mmol/L (ref 101–111)
CO2: 30 mmol/L (ref 22–32)
Calcium: 8.4 mg/dL — ABNORMAL LOW (ref 8.9–10.3)
Creatinine, Ser: 1.1 mg/dL (ref 0.61–1.24)
GFR calc non Af Amer: >60 mL/min (ref >60)
GLUCOSE: 239 mg/dL — AB (ref 65–99)
POTASSIUM: 4.4 mmol/L (ref 3.5–5.1)
SODIUM: 139 mmol/L (ref 135–145)

## 2015-07-11 LAB — CBC
HEMATOCRIT: 31.2 % — AB (ref 39.0–52.0)
HEMOGLOBIN: 9.5 g/dL — AB (ref 13.0–17.0)
MCH: 29.8 pg (ref 26.0–34.0)
MCHC: 30.4 g/dL (ref 30.0–36.0)
MCV: 97.8 fL (ref 78.0–100.0)
Platelets: 232 10*3/uL (ref 150–400)
RBC: 3.19 MIL/uL — ABNORMAL LOW (ref 4.22–5.81)
RDW: 13.7 % (ref 11.5–15.5)
WBC: 19.9 10*3/uL — ABNORMAL HIGH (ref 4.0–10.5)

## 2015-07-11 LAB — GLUCOSE, CAPILLARY
GLUCOSE-CAPILLARY: 197 mg/dL — AB (ref 65–99)
GLUCOSE-CAPILLARY: 173 mg/dL — AB (ref 65–99)
GLUCOSE-CAPILLARY: 187 mg/dL — AB (ref 65–99)
Glucose-Capillary: 141 mg/dL — ABNORMAL HIGH (ref 65–99)

## 2015-07-11 MED ORDER — SODIUM CHLORIDE 0.9 % IJ SOLN
10.0000 mL | INTRAMUSCULAR | Status: DC | PRN
  Administered 2015-07-12: 10 mL
  Filled 2015-07-11: qty 40

## 2015-07-11 NOTE — Progress Notes (Signed)
   Subjective: 2 Days Post-Op Procedure(s) (LRB): LEFT KNEE IRRIGATION AND DEBRIDEMENT WOUND CLOSURE (Left) Patient reports pain as mild.   Patient seen in rounds with Dr. Wynelle Link. Patient is well, but has had some minor complaints of pain in the knee, requiring pain medications We will continue therapy today.  NO BENDING OF KNEE FOR TWO WEEKS. May be WBAT when up ambulating. Plan is to go Home after hospital stay. Did not get PICC line yesterday.  Apparently had some emergencies and patient has a working IV.  Hopefully will get IV today and plan on home tomorrow.  Objective: Vital signs in last 24 hours: Temp:  [97.6 F (36.4 C)-98.4 F (36.9 C)] 98.4 F (36.9 C) (08/10 0454) Pulse Rate:  [67-80] 67 (08/10 0454) Resp:  [16-18] 16 (08/10 0454) BP: (110-139)/(44-54) 139/49 mmHg (08/10 0454) SpO2:  [96 %-98 %] 96 % (08/10 0454)  Intake/Output from previous day: 08/09 0701 - 08/10 0700 In: 1190 [P.O.:1140; IV Piggyback:50] Out: 1055 [Urine:1000; Drains:55]   Recent Labs  07/10/15 0515 07/11/15 0444  HGB 11.1* 9.5*    Recent Labs  07/10/15 0515 07/11/15 0444  WBC 10.0 19.9*  RBC 3.53* 3.19*  HCT 34.5* 31.2*  PLT 265 232    Recent Labs  07/10/15 0515 07/11/15 0444  NA 134* 139  K 4.8 4.4  CL 101 102  CO2 27 30  BUN 16 20  CREATININE 1.05 1.10  GLUCOSE 259* 239*  CALCIUM 8.5* 8.4*   No results for input(s): LABPT, INR in the last 72 hours.  EXAM General - Patient is Alert and Appropriate Extremity - Neurovascular intact Sensation intact distally Incision - scant dry drainage on incision, no active bleeding. Motor Function - intact, moving foot and toes well on exam.   Past Medical History  Diagnosis Date  . Chronic headaches   . Type 2 diabetes mellitus   . Essential hypertension   . Diverticulitis   . Syncope   . Hypercholesteremia   . GI bleed     Dr. Laural Golden - 1998  . Peptic ulcer disease   . Anemia, iron deficiency   . Depression   .  Chronic kidney disease   . Peripheral neuropathy     Assessment/Plan: 2 Days Post-Op Procedure(s) (LRB): LEFT KNEE IRRIGATION AND DEBRIDEMENT WOUND CLOSURE (Left) Active Problems:   Postoperative wound dehiscence  Estimated body mass index is 25.92 kg/(m^2) as calculated from the following:   Height as of this encounter: 6\' 2"  (1.88 m).   Weight as of this encounter: 91.627 kg (202 lb). Up with therapy Plan for discharge tomorrow  Hopefully PICC line today. Continue IV Ancef for now Cultures negative so far.  DVT Prophylaxis - Aspirin Weight-Bearing as tolerated to left leg D/C O2 and Pulse OX and try on Room Air Leave Drain In Place for now  Arlee Muslim, PA-C Orthopaedic Surgery 07/11/2015, 8:42 AM

## 2015-07-11 NOTE — Progress Notes (Signed)
Physical Therapy Treatment Patient Details Name: Jared Bridges MRN: [id number] DOB: 08-24-1930 Today's Date: 07/11/2015    History of Present Illness s/p L knee I and D; PMHx: L TKA in June 2016, HTN, spinal fusions    PT Comments    KI was too low on the leg. Instructed in proper position and tightening straps, precautions for no ROM L knee. Will need to instruct wife. Patient progressing well.  Follow Up Recommendations  Home health PT;No PT follow up     Equipment Recommendations  None recommended by PT    Recommendations for Other Services       Precautions / Restrictions Precautions Precaution Comments: NO ROM L knee Required Braces or Orthoses: Other Brace/Splint;Knee Immobilizer - Right Knee Immobilizer - Left: On at all times    Mobility  Bed Mobility   Bed Mobility: Supine to Sit;Sit to Supine     Supine to sit: Min assist Sit to supine: Min assist   General bed mobility comments: assist w/ L leg  Transfers   Equipment used: Rolling walker (2 wheeled) Transfers: Sit to/from Stand Sit to Stand: Min guard         General transfer comment: cues for hand placement and LLE position  Ambulation/Gait Ambulation/Gait assistance: Min guard;Min assist Ambulation Distance (Feet): 55 Feet   Gait Pattern/deviations: WFL(Within Functional Limits);Step-to pattern;Step-through pattern     General Gait Details: cues for posture, position from RW/step length, slight posterior LOB x3 with min/min/guard recovery (during initial 20')   Stairs            Wheelchair Mobility    Modified Rankin (Stroke Patients Only)       Balance                                    Cognition Arousal/Alertness: Awake/alert                          Exercises      General Comments        Pertinent Vitals/Pain Pain Score: 3  Pain Location: L knee Pain Descriptors / Indicators: Aching Pain Intervention(s): Monitored during  session;Premedicated before session;Repositioned    Home Living                      Prior Function            PT Goals (current goals can now be found in the care plan section) Progress towards PT goals: Progressing toward goals    Frequency  Min 6X/week    PT Plan Current plan remains appropriate    Co-evaluation             End of Session Equipment Utilized During Treatment: Gait belt;Left knee immobilizer Activity Tolerance: Patient limited by pain;Patient tolerated treatment well Patient left: in bed;with call bell/phone within reach     Time: 1420-1454 PT Time Calculation (min) (ACUTE ONLY): 34 min  Charges:  $Gait Training: 8-22 mins $Self Care/Home Management: 08/02/2023                    G Codes:      Claretha Cooper 07/11/2015, 3:10 PM

## 2015-07-11 NOTE — Care Management Important Message (Signed)
Important Message  Patient Details  Name: [name] MRN: [id number] Date of Birth: 08/21/1930   Medicare Important Message Given:  Yes-second notification given    Camillo Flaming 07/11/2015, 12:02 [place] Message  Patient Details  Name: [name] MRN: [id number] Date of Birth: Mar 26, 1930   Medicare Important Message Given:  Yes-second notification given    Camillo Flaming 07/11/2015, 12:02 PM

## 2015-07-11 NOTE — Progress Notes (Signed)
Peripherally Inserted Central Catheter/Midline Placement  The IV Nurse has discussed with the patient and/or persons authorized to consent for the patient, the purpose of this procedure and the potential benefits and risks involved with this procedure.  The benefits include less needle sticks, lab draws from the catheter and patient may be discharged home with the catheter.  Risks include, but not limited to, infection, bleeding, blood clot (thrombus formation), and puncture of an artery; nerve damage and irregular heat beat.  Alternatives to this procedure were also discussed.  PICC/Midline Placement Documentation        Jared Bridges 07/11/2015, 10:09 AM

## 2015-07-12 LAB — CBC
HCT: 32.1 % — ABNORMAL LOW (ref 39.0–52.0)
Hemoglobin: 9.7 g/dL — ABNORMAL LOW (ref 13.0–17.0)
MCH: 29.8 pg (ref 26.0–34.0)
MCHC: 30.2 g/dL (ref 30.0–36.0)
MCV: 98.5 fL (ref 78.0–100.0)
PLATELETS: 217 10*3/uL (ref 150–400)
RBC: 3.26 MIL/uL — ABNORMAL LOW (ref 4.22–5.81)
RDW: 13.8 % (ref 11.5–15.5)
WBC: 11.5 10*3/uL — ABNORMAL HIGH (ref 4.0–10.5)

## 2015-07-12 LAB — WOUND CULTURE: CULTURE: NO GROWTH

## 2015-07-12 LAB — GLUCOSE, CAPILLARY
Glucose-Capillary: 101 mg/dL — ABNORMAL HIGH (ref 65–99)
Glucose-Capillary: 106 mg/dL — ABNORMAL HIGH (ref 65–99)

## 2015-07-12 MED ORDER — CEFAZOLIN SODIUM-DEXTROSE 2-3 GM-% IV SOLR: 2.0000 g | Freq: Four times a day (QID) | INTRAVENOUS | Status: DC

## 2015-07-12 MED ORDER — TRAMADOL HCL 50 MG PO TABS: 50.0000 mg | ORAL_TABLET | Freq: Four times a day (QID) | ORAL | Status: DC | PRN

## 2015-07-12 MED ORDER — CEFAZOLIN SODIUM-DEXTROSE 2-3 GM-% IV SOLR
2.0000 g | Freq: Three times a day (TID) | INTRAVENOUS | Status: DC
  Administered 2015-07-12: 2 g via INTRAVENOUS
  Filled 2015-07-12 (×2): qty 50

## 2015-07-12 MED ORDER — HYDROCODONE-ACETAMINOPHEN 5-325 MG PO TABS: 1.0000 | ORAL_TABLET | ORAL | Status: DC | PRN

## 2015-07-12 MED ORDER — METHOCARBAMOL 500 MG PO TABS: 500.0000 mg | ORAL_TABLET | Freq: Four times a day (QID) | ORAL | Status: DC | PRN

## 2015-07-12 MED ORDER — ASPIRIN 325 MG PO TBEC
325.0000 mg | DELAYED_RELEASE_TABLET | Freq: Every day | ORAL | Status: DC
Start: 2015-07-12 — End: 2015-10-08

## 2015-07-12 MED ORDER — HEPARIN SOD (PORK) LOCK FLUSH 100 UNIT/ML IV SOLN
250.0000 [IU] | INTRAVENOUS | Status: AC | PRN
  Administered 2015-07-12: 250 [IU]

## 2015-07-12 NOTE — Discharge Summary (Signed)
Physician Discharge Summary   Patient ID: Jared Bridges MRN: [id number] DOB/AGE: 1930-03-19 81 y.o.  Admit date: 07/09/2015 Discharge date: 07/12/2015  Primary Diagnosis:  Left knee wound dehiscence.  Admission Diagnoses:  Past Medical History  Diagnosis Date  . Chronic headaches   . Type 2 diabetes mellitus   . Essential hypertension   . Diverticulitis   . Syncope   . Hypercholesteremia   . GI bleed     Dr. Laural Golden - 1998  . Peptic ulcer disease   . Anemia, iron deficiency   . Depression   . Chronic kidney disease   . Peripheral neuropathy    Discharge Diagnoses:   Active Problems:   Postoperative wound dehiscence  Estimated body mass index is 25.92 kg/(m^2) as calculated from the following:   Height as of this encounter: 6' 2" (1.88 m).   Weight as of this encounter: 91.627 kg (202 lb).  Procedure:  Procedure(s) (LRB): LEFT KNEE IRRIGATION AND DEBRIDEMENT WOUND CLOSURE (Left)   Consults: None  HPI: Jared Bridges is an 81 year old male who had a left total knee arthroplasty performed on May 28, 2015. He did well initially, but then developed blisters from the [place]. He has significant swelling. It took a while for the wound to heal. Approximately 10 days to 2 weeks ago, developed cellulitis. I saw him in the office approximately 3 days ago, noted that there was an area that the wound did open superficially and there was serous drainage. He was not having fever, chills, or systemic symptoms. We elected to take him to surgery today to perform irrigation and debridement of the wound.  Laboratory Data: Admission on 07/09/2015  Component Date Value Ref Range Status  . Glucose-Capillary 07/09/2015 145* 65 - 99 mg/dL Final  . Specimen Description 07/09/2015 WOUND LEFT KNEE   Final  . Special Requests 07/09/2015 PATIENT ON FOLLOWING ANCEF   Final  . Gram Stain 07/09/2015    Final                   Value:RARE WBC PRESENT, PREDOMINANTLY PMN NO SQUAMOUS  EPITHELIAL CELLS SEEN NO ORGANISMS SEEN Performed at [facility]   . Culture 07/09/2015    Final                   Value:NO ANAEROBES ISOLATED; CULTURE IN PROGRESS FOR 5 DAYS Performed at [facility]   . Report Status 07/09/2015 PENDING   Incomplete  . WBC 07/10/2015 10.0  4.0 - 10.5 K/uL Final  . RBC 07/10/2015 3.53* 4.22 - 5.81 MIL/uL Final  . Hemoglobin 07/10/2015 11.1* 13.0 - 17.0 g/dL Final  . HCT 07/10/2015 34.5* 39.0 - 52.0 % Final  . MCV 07/10/2015 97.7  78.0 - 100.0 fL Final  . MCH 07/10/2015 31.4  26.0 - 34.0 pg Final  . MCHC 07/10/2015 32.2  30.0 - 36.0 g/dL Final  . RDW 07/10/2015 13.6  11.5 - 15.5 % Final  . Platelets 07/10/2015 265  150 - 400 K/uL Final  . Sodium 07/10/2015 134* 135 - 145 mmol/L Final  . Potassium 07/10/2015 4.8  3.5 - 5.1 mmol/L Final  . Chloride 07/10/2015 101  101 - 111 mmol/L Final  . CO2 07/10/2015 27  22 - 32 mmol/L Final  . Glucose, Bld 07/10/2015 259* 65 - 99 mg/dL Final  . BUN 07/10/2015 16  6 - 20 mg/dL Final  . Creatinine, Ser 07/10/2015 1.05  0.61 - 1.24 mg/dL Final  . Calcium 07/10/2015 8.5*  8.9 - 10.3 mg/dL Final  . GFR calc non Af Amer 07/10/2015 >60  >60 mL/min Final  . GFR calc Af Amer 07/10/2015 >60  >60 mL/min Final   Comment: (NOTE) The eGFR has been calculated using the CKD EPI equation. This calculation has not been validated in all clinical situations. eGFR's persistently <60 mL/min signify possible Chronic Kidney Disease.   . Anion gap 07/10/2015 6  5 - 15 Final  . Specimen Description 07/09/2015 WOUND LEFT KNEE   Final  . Special Requests 07/09/2015 PATIENT ON FOLLOWING ANCEF   Final  . Gram Stain 07/09/2015    Final                   Value:RARE WBC PRESENT, PREDOMINANTLY PMN NO SQUAMOUS EPITHELIAL CELLS SEEN NO ORGANISMS SEEN Performed at [facility]   . Culture 07/09/2015    Final                   Value:NO GROWTH 1 DAY Performed at [facility]   . Report Status 07/09/2015  PENDING   Incomplete  . WBC 07/11/2015 19.9* 4.0 - 10.5 K/uL Final  . RBC 07/11/2015 3.19* 4.22 - 5.81 MIL/uL Final  . Hemoglobin 07/11/2015 9.5* 13.0 - 17.0 g/dL Final  . HCT 07/11/2015 31.2* 39.0 - 52.0 % Final  . MCV 07/11/2015 97.8  78.0 - 100.0 fL Final  . MCH 07/11/2015 29.8  26.0 - 34.0 pg Final  . MCHC 07/11/2015 30.4  30.0 - 36.0 g/dL Final  . RDW 07/11/2015 13.7  11.5 - 15.5 % Final  . Platelets 07/11/2015 232  150 - 400 K/uL Final  . Sodium 07/11/2015 139  135 - 145 mmol/L Final  . Potassium 07/11/2015 4.4  3.5 - 5.1 mmol/L Final  . Chloride 07/11/2015 102  101 - 111 mmol/L Final  . CO2 07/11/2015 30  22 - 32 mmol/L Final  . Glucose, Bld 07/11/2015 239* 65 - 99 mg/dL Final  . BUN 07/11/2015 20  6 - 20 mg/dL Final  . Creatinine, Ser 07/11/2015 1.10  0.61 - 1.24 mg/dL Final  . Calcium 07/11/2015 8.4* 8.9 - 10.3 mg/dL Final  . GFR calc non Af Amer 07/11/2015 >60  >60 mL/min Final  . GFR calc Af Amer 07/11/2015 >60  >60 mL/min Final   Comment: (NOTE) The eGFR has been calculated using the CKD EPI equation. This calculation has not been validated in all clinical situations. eGFR's persistently <60 mL/min signify possible Chronic Kidney Disease.   . Anion gap 07/11/2015 7  5 - 15 Final  . Glucose-Capillary 07/10/2015 223* 65 - 99 mg/dL Final  . Glucose-Capillary 07/10/2015 222* 65 - 99 mg/dL Final  . Glucose-Capillary 07/11/2015 197* 65 - 99 mg/dL Final  . Glucose-Capillary 07/11/2015 141* 65 - 99 mg/dL Final  . WBC 07/12/2015 11.5* 4.0 - 10.5 K/uL Final  . RBC 07/12/2015 3.26* 4.22 - 5.81 MIL/uL Final  . Hemoglobin 07/12/2015 9.7* 13.0 - 17.0 g/dL Final  . HCT 07/12/2015 32.1* 39.0 - 52.0 % Final  . MCV 07/12/2015 98.5  78.0 - 100.0 fL Final  . MCH 07/12/2015 29.8  26.0 - 34.0 pg Final  . MCHC 07/12/2015 30.2  30.0 - 36.0 g/dL Final  . RDW 07/12/2015 13.8  11.5 - 15.5 % Final  . Platelets 07/12/2015 217  150 - 400 K/uL Final  . Glucose-Capillary 07/11/2015 173* 65 - 99  mg/dL Final  . Glucose-Capillary 07/11/2015 187* 65 - 99 mg/dL Final  .  Comment 1 07/11/2015 Notify RN   Final  . Glucose-Capillary 07/12/2015 101* 65 - 99 mg/dL Final  Admission on 06/29/2015, Discharged on 06/30/2015  Component Date Value Ref Range Status  . WBC 06/29/2015 9.3  4.0 - 10.5 K/uL Final  . RBC 06/29/2015 3.70* 4.22 - 5.81 MIL/uL Final  . Hemoglobin 06/29/2015 11.5* 13.0 - 17.0 g/dL Final  . HCT 06/29/2015 37.0* 39.0 - 52.0 % Final  . MCV 06/29/2015 100.0  78.0 - 100.0 fL Final  . MCH 06/29/2015 31.1  26.0 - 34.0 pg Final  . MCHC 06/29/2015 31.1  30.0 - 36.0 g/dL Final  . RDW 06/29/2015 14.2  11.5 - 15.5 % Final  . Platelets 06/29/2015 267  150 - 400 K/uL Final  . Neutrophils Relative % 06/29/2015 76  43 - 77 % Final  . Neutro Abs 06/29/2015 7.0  1.7 - 7.7 K/uL Final  . Lymphocytes Relative 06/29/2015 12  12 - 46 % Final  . Lymphs Abs 06/29/2015 1.1  0.7 - 4.0 K/uL Final  . Monocytes Relative 06/29/2015 9  3 - 12 % Final  . Monocytes Absolute 06/29/2015 0.8  0.1 - 1.0 K/uL Final  . Eosinophils Relative 06/29/2015 3  0 - 5 % Final  . Eosinophils Absolute 06/29/2015 0.3  0.0 - 0.7 K/uL Final  . Basophils Relative 06/29/2015 0  0 - 1 % Final  . Basophils Absolute 06/29/2015 0.0  0.0 - 0.1 K/uL Final  . Sodium 06/29/2015 136  135 - 145 mmol/L Final  . Potassium 06/29/2015 3.9  3.5 - 5.1 mmol/L Final  . Chloride 06/29/2015 101  101 - 111 mmol/L Final  . CO2 06/29/2015 27  22 - 32 mmol/L Final  . Glucose, Bld 06/29/2015 213* 65 - 99 mg/dL Final  . BUN 06/29/2015 23* 6 - 20 mg/dL Final  . Creatinine, Ser 06/29/2015 1.04  0.61 - 1.24 mg/dL Final  . Calcium 06/29/2015 8.8* 8.9 - 10.3 mg/dL Final  . Total Protein 06/29/2015 6.9  6.5 - 8.1 g/dL Final  . Albumin 06/29/2015 3.5  3.5 - 5.0 g/dL Final  . AST 06/29/2015 21  15 - 41 U/L Final  . ALT 06/29/2015 14* 17 - 63 U/L Final  . Alkaline Phosphatase 06/29/2015 72  38 - 126 U/L Final  . Total Bilirubin 06/29/2015 0.5  0.3 -  1.2 mg/dL Final  . GFR calc non Af Amer 06/29/2015 >60  >60 mL/min Final  . GFR calc Af Amer 06/29/2015 >60  >60 mL/min Final   Comment: (NOTE) The eGFR has been calculated using the CKD EPI equation. This calculation has not been validated in all clinical situations. eGFR's persistently <60 mL/min signify possible Chronic Kidney Disease.   . Anion gap 06/29/2015 8  5 - 15 Final  . Specimen Description 06/29/2015 LEFT ANTECUBITAL   Final  . Special Requests 06/29/2015 BOTTLES DRAWN AEROBIC AND ANAEROBIC 6CC   Final  . Culture 06/29/2015 NO GROWTH 5 DAYS   Final  . Report Status 06/29/2015 07/04/2015 FINAL   Final  . Specimen Description 06/29/2015 BLOOD RIGHT HAND   Final  . Special Requests 06/29/2015 BOTTLES DRAWN AEROBIC AND ANAEROBIC 6CC   Final  . Culture 06/29/2015 NO GROWTH 5 DAYS   Final  . Report Status 06/29/2015 07/04/2015 FINAL   Final  . Prothrombin Time 06/29/2015 14.4  11.6 - 15.2 seconds Final  . INR 06/29/2015 1.10  0.00 - 1.49 Final  . Lactic Acid, Venous 06/29/2015 3.08* 0.5 - 2.0 mmol/L Final  .  Comment 06/29/2015 NOTIFIED PHYSICIAN   Final  . Sed Rate 06/29/2015 38* 0 - 16 mm/hr Final  . CRP 06/29/2015 0.6  <1.0 mg/dL Final   Performed at [facility]  . Lactic Acid, Venous 06/29/2015 0.98  0.5 - 2.0 mmol/L Final  . Glucose-Capillary 06/29/2015 134* 65 - 99 mg/dL Final  . Comment 1 06/29/2015 Notify RN   Final  . Comment 2 06/29/2015 Document in Chart   Final  . Lactic Acid, Venous 06/29/2015 1.6  0.5 - 2.0 mmol/L Final  . Lactic Acid, Venous 06/29/2015 1.1  0.5 - 2.0 mmol/L Final  . Procalcitonin 06/29/2015 <0.10   Final   Comment:        Interpretation: PCT (Procalcitonin) <= 0.5 ng/mL: Systemic infection (sepsis) is not likely. Local bacterial infection is possible. (NOTE)         ICU PCT Algorithm               Non ICU PCT Algorithm    ----------------------------     ------------------------------         PCT < 0.25 ng/mL                 PCT <  0.1 ng/mL     Stopping of antibiotics            Stopping of antibiotics       strongly encouraged.               strongly encouraged.    ----------------------------     ------------------------------       PCT level decrease by               PCT < 0.25 ng/mL       >= 80% from peak PCT       OR PCT 0.25 - 0.5 ng/mL          Stopping of antibiotics                                             encouraged.     Stopping of antibiotics           encouraged.    ----------------------------     ------------------------------       PCT level decrease by              PCT >= 0.25 ng/mL       < 80% from peak PCT        AND PCT >= 0.5 ng/mL            Continuin                          g antibiotics                                              encouraged.       Continuing antibiotics            encouraged.    ----------------------------     ------------------------------     PCT level increase compared          PCT > 0.5 ng/mL         with peak PCT AND  PCT >= 0.5 ng/mL             Escalation of antibiotics                                          strongly encouraged.      Escalation of antibiotics        strongly encouraged.   . Prothrombin Time 06/29/2015 14.6  11.6 - 15.2 seconds Final  . INR 06/29/2015 1.13  0.00 - 1.49 Final  . aPTT 06/29/2015 29  24 - 37 seconds Final  . Cortisol, Plasma 06/29/2015 3.4   Final   Comment: (NOTE) AM    6.7 - 22.6 ug/dL PM   <10.0       ug/dL Performed at [facility]   . TSH 06/29/2015 0.836  0.350 - 4.500 uIU/mL Final  . Hgb A1c MFr Bld 06/29/2015 5.5  4.8 - 5.6 % Final   Comment: (NOTE)         Pre-diabetes: 5.7 - 6.4         Diabetes: >6.4         Glycemic control for adults with diabetes: <7.0   . Mean Plasma Glucose 06/29/2015 111   Final   Comment: (NOTE) Performed At: [facility] [place], [place] [id number] Lindon Romp MD TZ:[id number]   . WBC 06/30/2015 9.1  4.0 - 10.5 K/uL Final  . RBC  06/30/2015 3.57* 4.22 - 5.81 MIL/uL Final  . Hemoglobin 06/30/2015 11.1* 13.0 - 17.0 g/dL Final  . HCT 06/30/2015 35.7* 39.0 - 52.0 % Final  . MCV 06/30/2015 100.0  78.0 - 100.0 fL Final  . MCH 06/30/2015 31.1  26.0 - 34.0 pg Final  . MCHC 06/30/2015 31.1  30.0 - 36.0 g/dL Final  . RDW 06/30/2015 14.0  11.5 - 15.5 % Final  . Platelets 06/30/2015 246  150 - 400 K/uL Final  . Sodium 06/30/2015 141  135 - 145 mmol/L Final  . Potassium 06/30/2015 3.8  3.5 - 5.1 mmol/L Final  . Chloride 06/30/2015 106  101 - 111 mmol/L Final  . CO2 06/30/2015 29  22 - 32 mmol/L Final  . Glucose, Bld 06/30/2015 120* 65 - 99 mg/dL Final  . BUN 06/30/2015 15  6 - 20 mg/dL Final  . Creatinine, Ser 06/30/2015 0.88  0.61 - 1.24 mg/dL Final  . Calcium 06/30/2015 8.9  8.9 - 10.3 mg/dL Final  . Total Protein 06/30/2015 6.0* 6.5 - 8.1 g/dL Final  . Albumin 06/30/2015 3.2* 3.5 - 5.0 g/dL Final  . AST 06/30/2015 16  15 - 41 U/L Final  . ALT 06/30/2015 14* 17 - 63 U/L Final  . Alkaline Phosphatase 06/30/2015 60  38 - 126 U/L Final  . Total Bilirubin 06/30/2015 0.8  0.3 - 1.2 mg/dL Final  . GFR calc non Af Amer 06/30/2015 >60  >60 mL/min Final  . GFR calc Af Amer 06/30/2015 >60  >60 mL/min Final   Comment: (NOTE) The eGFR has been calculated using the CKD EPI equation. This calculation has not been validated in all clinical situations. eGFR's persistently <60 mL/min signify possible Chronic Kidney Disease.   . Anion gap 06/30/2015 6  5 - 15 Final  . Glucose-Capillary 06/29/2015 153* 65 - 99 mg/dL Final  . Comment 1 06/29/2015 Notify RN   Final  . Comment 2 06/29/2015 Document in Chart  Final  . Glucose-Capillary 06/30/2015 100* 65 - 99 mg/dL Final  . Glucose-Capillary 06/30/2015 81  65 - 99 mg/dL Final  . Glucose-Capillary 06/30/2015 98  65 - 99 mg/dL Final  . Comment 1 06/30/2015 Notify RN   Final  . Comment 2 06/30/2015 Document in Chart   Final  . Glucose-Capillary 06/30/2015 120* 65 - 99 mg/dL Final  .  Comment 1 06/30/2015 Notify RN   Final  . Comment 2 06/30/2015 Document in Chart   Final  Admission on 05/28/2015, Discharged on 05/30/2015  Component Date Value Ref Range Status  . Glucose-Capillary 05/28/2015 131* 65 - 99 mg/dL Final  . Comment 1 05/28/2015 Notify RN   Final  . Glucose-Capillary 05/28/2015 111* 65 - 99 mg/dL Final  . Comment 1 05/28/2015 Document in Chart   Final  . Glucose-Capillary 05/28/2015 258* 65 - 99 mg/dL Final  . WBC 05/29/2015 23.1* 4.0 - 10.5 K/uL Final  . RBC 05/29/2015 3.70* 4.22 - 5.81 MIL/uL Final  . Hemoglobin 05/29/2015 11.4* 13.0 - 17.0 g/dL Final  . HCT 05/29/2015 35.5* 39.0 - 52.0 % Final  . MCV 05/29/2015 95.9  78.0 - 100.0 fL Final  . MCH 05/29/2015 30.8  26.0 - 34.0 pg Final  . MCHC 05/29/2015 32.1  30.0 - 36.0 g/dL Final  . RDW 05/29/2015 12.8  11.5 - 15.5 % Final  . Platelets 05/29/2015 182  150 - 400 K/uL Final  . Sodium 05/29/2015 136  135 - 145 mmol/L Final  . Potassium 05/29/2015 4.4  3.5 - 5.1 mmol/L Final  . Chloride 05/29/2015 102  101 - 111 mmol/L Final  . CO2 05/29/2015 28  22 - 32 mmol/L Final  . Glucose, Bld 05/29/2015 196* 65 - 99 mg/dL Final  . BUN 05/29/2015 20  6 - 20 mg/dL Final  . Creatinine, Ser 05/29/2015 0.96  0.61 - 1.24 mg/dL Final  . Calcium 05/29/2015 8.1* 8.9 - 10.3 mg/dL Final  . GFR calc non Af Amer 05/29/2015 >60  >60 mL/min Final  . GFR calc Af Amer 05/29/2015 >60  >60 mL/min Final   Comment: (NOTE) The eGFR has been calculated using the CKD EPI equation. This calculation has not been validated in all clinical situations. eGFR's persistently <60 mL/min signify possible Chronic Kidney Disease.   . Anion gap 05/29/2015 6  5 - 15 Final  . Glucose-Capillary 05/28/2015 245* 65 - 99 mg/dL Final  . Glucose-Capillary 05/29/2015 175* 65 - 99 mg/dL Final  . Glucose-Capillary 05/29/2015 225* 65 - 99 mg/dL Final  . WBC 05/30/2015 18.9* 4.0 - 10.5 K/uL Final  . RBC 05/30/2015 3.10* 4.22 - 5.81 MIL/uL Final  .  Hemoglobin 05/30/2015 10.0* 13.0 - 17.0 g/dL Final  . HCT 05/30/2015 30.3* 39.0 - 52.0 % Final  . MCV 05/30/2015 97.7  78.0 - 100.0 fL Final  . MCH 05/30/2015 32.3  26.0 - 34.0 pg Final  . MCHC 05/30/2015 33.0  30.0 - 36.0 g/dL Final  . RDW 05/30/2015 13.6  11.5 - 15.5 % Final  . Platelets 05/30/2015 204  150 - 400 K/uL Final  . Sodium 05/30/2015 134* 135 - 145 mmol/L Final  . Potassium 05/30/2015 4.2  3.5 - 5.1 mmol/L Final  . Chloride 05/30/2015 100* 101 - 111 mmol/L Final  . CO2 05/30/2015 26  22 - 32 mmol/L Final  . Glucose, Bld 05/30/2015 210* 65 - 99 mg/dL Final  . BUN 05/30/2015 23* 6 - 20 mg/dL Final  . Creatinine, Ser 05/30/2015 1.13  0.61 - 1.24 mg/dL Final  . Calcium 05/30/2015 8.2* 8.9 - 10.3 mg/dL Final  . GFR calc non Af Amer 05/30/2015 59* >60 mL/min Final  . GFR calc Af Amer 05/30/2015 >60  >60 mL/min Final   Comment: (NOTE) The eGFR has been calculated using the CKD EPI equation. This calculation has not been validated in all clinical situations. eGFR's persistently <60 mL/min signify possible Chronic Kidney Disease.   . Anion gap 05/30/2015 8  5 - 15 Final  . Glucose-Capillary 05/29/2015 173* 65 - 99 mg/dL Final  . Glucose-Capillary 05/29/2015 185* 65 - 99 mg/dL Final  . Glucose-Capillary 05/30/2015 180* 65 - 99 mg/dL Final  Hospital Outpatient Visit on 05/21/2015  Component Date Value Ref Range Status  . MRSA, PCR 05/21/2015 NEGATIVE  NEGATIVE Final  . Staphylococcus aureus 05/21/2015 NEGATIVE  NEGATIVE Final   Comment:        The Xpert SA Assay (FDA approved for NASAL specimens in patients over 79 years of age), is one component of a comprehensive surveillance program.  Test performance has been validated by [facility] for patients greater than or equal to 79 year old. It is not intended to diagnose infection nor to guide or monitor treatment.   [name] Kitchen aPTT 05/21/2015 30  24 - 37 seconds Final  . WBC 05/21/2015 13.1* 4.0 - 10.5 K/uL Final  . RBC  05/21/2015 4.57  4.22 - 5.81 MIL/uL Final  . Hemoglobin 05/21/2015 14.5  13.0 - 17.0 g/dL Final  . HCT 05/21/2015 44.8  39.0 - 52.0 % Final  . MCV 05/21/2015 98.0  78.0 - 100.0 fL Final  . MCH 05/21/2015 31.7  26.0 - 34.0 pg Final  . MCHC 05/21/2015 32.4  30.0 - 36.0 g/dL Final  . RDW 05/21/2015 13.3  11.5 - 15.5 % Final  . Platelets 05/21/2015 210  150 - 400 K/uL Final  . Sodium 05/21/2015 140  135 - 145 mmol/L Final  . Potassium 05/21/2015 5.3* 3.5 - 5.1 mmol/L Final   SLIGHT HEMOLYSIS  . Chloride 05/21/2015 103  101 - 111 mmol/L Final  . CO2 05/21/2015 30  22 - 32 mmol/L Final  . Glucose, Bld 05/21/2015 171* 65 - 99 mg/dL Final  . BUN 05/21/2015 28* 6 - 20 mg/dL Final  . Creatinine, Ser 05/21/2015 1.06  0.61 - 1.24 mg/dL Final  . Calcium 05/21/2015 9.6  8.9 - 10.3 mg/dL Final  . Total Protein 05/21/2015 7.4  6.5 - 8.1 g/dL Final  . Albumin 05/21/2015 4.1  3.5 - 5.0 g/dL Final  . AST 05/21/2015 26  15 - 41 U/L Final  . ALT 05/21/2015 26  17 - 63 U/L Final  . Alkaline Phosphatase 05/21/2015 74  38 - 126 U/L Final  . Total Bilirubin 05/21/2015 0.7  0.3 - 1.2 mg/dL Final  . GFR calc non Af Amer 05/21/2015 >60  >60 mL/min Final  . GFR calc Af Amer 05/21/2015 >60  >60 mL/min Final   Comment: (NOTE) The eGFR has been calculated using the CKD EPI equation. This calculation has not been validated in all clinical situations. eGFR's persistently <60 mL/min signify possible Chronic Kidney Disease.   . Anion gap 05/21/2015 7  5 - 15 Final  . Prothrombin Time 05/21/2015 13.7  11.6 - 15.2 seconds Final  . INR 05/21/2015 1.03  0.00 - 1.49 Final  . ABO/RH(D) 05/21/2015 O POS   Final  . Antibody Screen 05/21/2015 NEG   Final  . Sample Expiration 05/21/2015 05/31/2015  Final  . Color, Urine 05/21/2015 YELLOW  YELLOW Final  . APPearance 05/21/2015 CLEAR  CLEAR Final  . Specific Gravity, Urine 05/21/2015 1.028  1.005 - 1.030 Final  . pH 05/21/2015 6.0  5.0 - 8.0 Final  . Glucose, UA  05/21/2015 NEGATIVE  NEGATIVE mg/dL Final  . Hgb urine dipstick 05/21/2015 TRACE* NEGATIVE Final  . Bilirubin Urine 05/21/2015 NEGATIVE  NEGATIVE Final  . Ketones, ur 05/21/2015 NEGATIVE  NEGATIVE mg/dL Final  . Protein, ur 05/21/2015 NEGATIVE  NEGATIVE mg/dL Final  . Urobilinogen, UA 05/21/2015 1.0  0.0 - 1.0 mg/dL Final  . Nitrite 05/21/2015 NEGATIVE  NEGATIVE Final  . Leukocytes, UA 05/21/2015 NEGATIVE  NEGATIVE Final  . RBC / HPF 05/21/2015 0-2  <3 RBC/hpf Final  . Urine-Other 05/21/2015 MUCOUS PRESENT   Final  . ABO/RH(D) 05/21/2015 O POS   Final     X-Rays:Dg Chest Port 1 View  06/29/2015   CLINICAL DATA:  Sepsis  EXAM: PORTABLE CHEST - 1 VIEW  COMPARISON:  03/08/2015  FINDINGS: The heart size and mediastinal contours are within normal limits. Both lungs are clear. The visualized skeletal structures are unremarkable.  IMPRESSION: No active disease.   Electronically Signed   By: Rolm Baptise M.D.   On: 06/29/2015 19:40   Dg Knee Complete 4 Views Left  06/29/2015   CLINICAL DATA:  LEFT total knee replacement 3 weeks prior. Wound discharge.  EXAM: LEFT KNEE - COMPLETE 4+ VIEW  COMPARISON:  None.  FINDINGS: LEFT total knee arthroplasty noted. There is a small suprapatellar joint effusion. No foreign body.  IMPRESSION: Suprapatellar joint effusion.   Electronically Signed   By: Suzy Bouchard M.D.   On: 06/29/2015 15:39    EKG: Orders placed or performed in visit on 03/08/15  . EKG 12-Lead     Hospital Course: Jared Bridges is a 81 y.o. who was admitted to [facility]. They were brought to the operating room on 07/09/2015 and underwent Procedure(s): LEFT KNEE IRRIGATION AND [place].  Patient tolerated the procedure well and was later transferred to the recovery room and then to the orthopaedic floor for postoperative care.  They were given PO and IV analgesics for pain control following their surgery.  They were given IV postoperative antibiotics of    Anti-infectives    Start     Dose/Rate Route Frequency Ordered Stop   07/12/15 0000  ceFAZolin (ANCEF) 2-3 GM-% SOLR     2 g 100 mL/hr over 30 Minutes Intravenous Every 6 hours 07/12/15 0718     07/10/15 0600  ceFAZolin (ANCEF) IVPB 2 g/50 mL premix     2 g 100 mL/hr over 30 Minutes Intravenous On call to O.R. 07/09/15 1630 07/09/15 1924   07/10/15 0100  ceFAZolin (ANCEF) IVPB 2 g/50 mL premix     2 g 100 mL/hr over 30 Minutes Intravenous 4 times per day 07/09/15 2108       and started on DVT prophylaxis in the form of Aspirin.  [name] Kitchen  Discharge planning consulted to help with postop disposition and equipment needs.  Patient had a dcent night on the evening of surgery with only minor complaints of pain. PICC line was ordered for Home IV ABX. They started to get up OOB with therapy on day one. NO BENDING OF KNEE FOR TWO WEEKS. Hemovac drain was left in place for the first couple of days.  Continued to work with therapy into day two.  Dressing was changed on day  two and the incision was healing well.  Erythema had improved since before surgery.  Cultures taken at time of surgery remained negative by day two.  By day three, the patient's wound/incision had improved.  Incision was healing well. Hemovac drain was removed by Dr. Wynelle Link.  Patient was seen in rounds and was ready to go home following arrangement for Home IV ABX.  Discharge home Diet - Cardiac diet, Diabetic diet and Renal diet Follow up - in 5 days on Tuesday 07/17/2015 Activity - WBAT, NO BENDING OF KNEE FOR TWO WEEKS. May be WBAT when up ambulating. Disposition - Home Condition Upon Discharge - Stable D/C Meds - See DC Summary DVT Prophylaxis - Aspirin   Discharge Instructions    Call MD / Call 911    Complete by:  As directed   If you experience chest pain or shortness of breath, CALL 911 and be transported to the hospital emergency room.  If you develope a fever above 101 F, pus (white drainage) or increased drainage or redness  at the wound, or calf pain, call your surgeon's office.     Change dressing    Complete by:  As directed   Change dressing daily with sterile 4 x 4 inch gauze dressing and apply TED hose. Do not submerge the incision under water.     Constipation Prevention    Complete by:  As directed   Drink plenty of fluids.  Prune juice may be helpful.  You may use a stool softener, such as Colace (over the counter) 100 mg twice a day.  Use MiraLax (over the counter) for constipation as needed.     Diet - low sodium heart healthy    Complete by:  As directed      Diet Carb Modified    Complete by:  As directed      Discharge instructions    Complete by:  As directed   Pick up stool softner and laxative for home use following surgery while on pain medications. Do not submerge incision under water. Please use good hand washing techniques while changing dressing each day. May shower starting three days after surgery. Please use a clean towel to pat the incision dry following showers. Continue to use ice for pain and swelling after surgery. Do not use any lotions or creams on the incision until instructed by your surgeon.  Resume daily Aspirin 325 mg at home.  Postoperative Constipation Protocol  Constipation - defined medically as fewer than three stools per week and severe constipation as less than one stool per week.  One of the most common issues patients have following surgery is constipation.  Even if you have a regular bowel pattern at home, your normal regimen is likely to be disrupted due to multiple reasons following surgery.  Combination of anesthesia, postoperative narcotics, change in appetite and fluid intake all can affect your bowels.  In order to avoid complications following surgery, here are some recommendations in order to help you during your recovery period.  Colace (docusate) - Pick up an over-the-counter form of Colace or another stool softener and take twice a day as long as you  are requiring postoperative pain medications.  Take with a full glass of water daily.  If you experience loose stools or diarrhea, hold the colace until you stool forms back up.  If your symptoms do not get better within 1 week or if they get worse, check with your doctor.  Dulcolax (bisacodyl) - Pick up  over-the-counter and take as directed by the product packaging as needed to assist with the movement of your bowels.  Take with a full glass of water.  Use this product as needed if not relieved by Colace only.   MiraLax (polyethylene glycol) - Pick up over-the-counter to have on hand.  MiraLax is a solution that will increase the amount of water in your bowels to assist with bowel movements.  Take as directed and can mix with a glass of water, juice, soda, coffee, or tea.  Take if you go more than two days without a movement. Do not use MiraLax more than once per day. Call your doctor if you are still constipated or irregular after using this medication for 7 days in a row.  If you continue to have problems with postoperative constipation, please contact the office for further assistance and recommendations.  If you experience "the worst abdominal pain ever" or develop nausea or vomiting, please contact the office immediatly for further recommendations for treatment.  NO BENDING TO THE LEFT KNEE. May be Weight Bearing As Tolerated when up ambulating. Avoid going up and down the stairs multiple times throughout the day.   Keep leg elevated and iced when mot up ambulating.     Do not put a pillow under the knee. Place it under the heel.    Complete by:  As directed      Do not sit on low chairs, stoools or toilet seats, as it may be difficult to get up from low surfaces    Complete by:  As directed      Driving restrictions    Complete by:  As directed   No driving until released by the physician.     Increase activity slowly as tolerated    Complete by:  As directed      Lifting restrictions     Complete by:  As directed   No lifting until released by the physician.     Patient may shower    Complete by:  As directed   You may shower without a dressing once there is no drainage.  Do not wash over the wound.  If drainage remains, do not shower until drainage stops.     TED hose    Complete by:  As directed   Use stockings (TED hose) for 3 weeks on both leg(s).  You may remove them at night for sleeping.     Weight bearing as tolerated    Complete by:  As directed   Laterality:  left  Extremity:  Lower            Medication List    STOP taking these medications        diclofenac sodium 1 % Gel  Commonly known as:  VOLTAREN     doxycycline 100 MG tablet  Commonly known as:  VIBRA-TABS     oxyCODONE 5 MG immediate release tablet  Commonly known as:  Oxy IR/ROXICODONE     rivaroxaban 10 MG Tabs tablet  Commonly known as:  XARELTO      TAKE these medications        aspirin 325 MG EC tablet  Take 1 tablet (325 mg total) by mouth daily with breakfast.     ceFAZolin 2-3 GM-% Solr  Commonly known as:  ANCEF  Inject 50 mLs (2 g total) into the vein every 6 (six) hours. Ancef 2 g every 6 hours via PICC Line at home to complete a  two week course.     clonazePAM 1 MG tablet  Commonly known as:  KLONOPIN  Take 1 tablet (1 mg total) by mouth 2 (two) times daily.     diazepam 5 MG tablet  Commonly known as:  VALIUM  Take 5 mg by mouth every 6 (six) hours as needed for anxiety (and/sleep).     divalproex 250 MG DR tablet  Commonly known as:  DEPAKOTE  Take 1 tablet (250 mg total) by mouth Nightly.     feeding supplement (GLUCERNA SHAKE) Liqd  Take 237 mLs by mouth 2 (two) times daily between meals.     glimepiride 1 MG tablet  Commonly known as:  AMARYL  Take 1 mg by mouth daily as needed (if blood sugar is above 150).     HYDROcodone-acetaminophen 5-325 MG per tablet  Commonly known as:  NORCO/VICODIN  Take 1-2 tablets by mouth every 4 (four) hours as needed  for moderate pain.     LYRICA 50 MG capsule  Generic drug:  pregabalin  Take 1 capsule by mouth at bedtime.     methocarbamol 500 MG tablet  Commonly known as:  ROBAXIN  Take 1 tablet (500 mg total) by mouth every 6 (six) hours as needed for muscle spasms.     pantoprazole 40 MG tablet  Commonly known as:  PROTONIX  Take 1 tablet (40 mg total) by mouth daily.     predniSONE 5 MG tablet  Commonly known as:  DELTASONE  Take 5 mg by mouth every morning.     traMADol 50 MG tablet  Commonly known as:  ULTRAM  Take 1-2 tablets (50-100 mg total) by mouth every 6 (six) hours as needed (mild pain).     venlafaxine XR 75 MG 24 hr capsule  Commonly known as:  EFFEXOR-XR  Take 75 mg by mouth daily with breakfast.     venlafaxine 37.5 MG tablet  Commonly known as:  EFFEXOR  Take 112.5 mg by mouth at bedtime.           Follow-up Information    Follow up with [name], MD. Schedule an appointment as soon as possible for a visit on 07/17/2015.   Specialty:  Orthopedic Surgery   Why:  Call office at [phone] to setup appointment on Tuesday 8/16 with Dr. Wynelle Link as instructed.   Contact information:   [address] [place] [address] [phone]       Signed: Arlee Muslim, PA-C Orthopaedic Surgery 07/12/2015, 7:24 AM

## 2015-07-12 NOTE — Progress Notes (Addendum)
12:05 Cm had an extensive conversation with pt and pt's daughter, [name] (on speaker phone) to ensure discharge plan to home with IV ABX is understood. SOC by [facility] will be this evening at 19:00.  CM emailed another private duty list to daughter [name].  No other CM needs were communicated.  CM received call from [facility] rep, Carolynn Sayers stating pt willing to go to SNF.  CM notified CSW and spoke with pt who is adamantly opposed to going to SNF.  Pt will be discharged home with [facility] rendering [facility] services for IV ABX administration.  CM called [facility] DME rep, Lecretia to please have the hospital bed delivered to the pt's home.  Pt has called wife to be at the home for acceptance of hospital bed.  CM called Carolynn Sayers to please arrange for IV ABX teaching and home IV ABX.  No other CM needs were communicated.

## 2015-07-12 NOTE — Discharge Instructions (Signed)
Dressing Change    A dressing is a material placed over wounds. It keeps the wound clean, dry, and protected from further injury.  BEFORE YOU BEGIN  Get your supplies together. Things you may need include:  Salt solution (saline).  Flexible gauze bandage.  Medicated cream.  Tape.  Gloves.  Belly (abdominal) pads.  Gauze squares.  Plastic bags. Take pain medicine 30 minutes before the bandage change if you need it.  Take a shower before you do the first bandage change of the day. Put plastic wrap or a bag over the dressing. REMOVING YOUR OLD BANDAGE  Wash your hands with soap and water. Dry your hands with a clean towel.  Put on your gloves.  Remove any tape.  Remove the old bandage as told. If it sticks, put a small amount of warm water on it to loosen the bandage.  Remove any gauze or packing tape in your wound.  Take off your gloves.  Put the gloves, tape, gauze, or any packing tape in a plastic bag. CHANGING YOUR BANDAGE  Open the supplies.  Take the cap off the salt solution.  Open the gauze. Leave the gauze on the inside of the package.  Put on your gloves.  Clean your wound as told by your doctor.  Keep your wound dry if your doctor told you to do so.  Your doctor may tell you to do one or more of the following:  Pick up the gauze. Pour the salt solution over the gauze. Squeeze out the extra salt solution.  Put medicated cream or other medicine on your wound.  Put solution soaked gauze only in your wound, not on the skin around it.  Pack your wound loosely.  Put dry gauze on your wound.  Put belly pads over the dry gauze if your bandages soak through. Tape the bandage in place so it will not fall off. Do not wrap the tape all the way around your arm or leg.  Wrap the bandage with the flexible gauze bandage as told by your doctor.  Take off your gloves. Put them in the plastic bag with the old bandage. Tie the bag shut and throw it away.  Keep the bandage clean and dry.    Wash your hands. GET HELP RIGHT AWAY IF:  Your skin around the wound looks red.  Your wound feels more tender or sore.  You see yellowish-white fluid (pus) in the wound.  Your wound smells bad.  You have a fever.  Your skin around the wound has a red rash that itches and burns.  You see black or yellow skin in your wound that was not there before.  You feel sick to your stomach (nauseous), throw up (vomit), and feel very tired. Document Released: 02/13/2009 Document Revised: 04/03/2014 Document Reviewed: 09/28/2011  [facility] Patient Information 2015 [place], [place]. This information is not intended to replace advice given to you by your health care provider. Make sure you discuss any questions you have with your health care provider.    PICC Insertion, Care After  Refer to this sheet in the next few weeks. These instructions provide you with information on caring for yourself after your procedure. Your health care provider may also give you more specific instructions. Your treatment has been planned according to current medical practices, but problems sometimes occur. Call your health care provider if you have any problems or questions after your procedure.  WHAT TO EXPECT AFTER THE PROCEDURE  After your procedure,  it is typical to have the following:  Mild discomfort at the insertion site. This should not last more than a day. HOME CARE INSTRUCTIONS  Rest at home for the remainder of the day after the procedure.  You may bend your arm and move it freely. If your PICC is near or at the bend of your elbow, avoid activity with repeated motion at the elbow.  Avoid lifting heavy objects as instructed by your health care provider.  Avoid using a crutch with the arm on the same side as your PICC. You may need to use a walker. Bandage Care  Keep your PICC bandage (dressing) clean and dry to prevent infection.  Ask your health care provider when you may shower. To keep the dressing dry, cover the  PICC with plastic wrap and tape before showering. If the dressing does become wet, replace it right after the shower.  Do not soak in the bath, swim, or use hot tubs when you have a PICC.  Change the PICC dressing as instructed by your health care provider.  Change your PICC dressing if it becomes loose or wet. General PICC Care  Check the PICC insertion site daily for leakage, redness, swelling, or pain.  Flush the PICC as directed by your health care provider. Let your health care provider know right away if the PICC is difficult to flush or does not flush. Do not use force to flush the PICC.  Do not use a syringe that is less than 10 mL to flush the PICC.  Never pull or tug on the PICC.  Avoid blood pressure checks on the arm with the PICC.  Keep your PICC identification card with you at all times.  Do not take the PICC out yourself. Only a trained health care professional should remove the PICC. SEEK MEDICAL CARE IF:  You have pain in your arm, ear, face, or teeth.  You have fever or chills.  You have drainage from the PICC insertion site.  You have redness or palpate a "cord" around the PICC insertion site.  You cannot flush the catheter. SEEK IMMEDIATE MEDICAL CARE IF:  You have swelling in the arm in which the PICC is inserted. Document Released: 09/07/2013 Document Revised: 11/22/2013 Document Reviewed: 09/07/2013  [facility] Patient Information 2015 [place], [place]. This information is not intended to replace advice given to you by your health care provider. Make sure you discuss any questions you have with your health care provider.    Pick up stool softner and laxative for home use following surgery while on pain medications. Do not submerge incision under water. Please use good hand washing techniques while changing dressing each day. May shower starting three days after surgery. Please use a clean towel to pat the incision dry following showers. Continue to use ice for pain  and swelling after surgery. Do not use any lotions or creams on the incision until instructed by your surgeon.  Resume daily Aspirin 325 mg at home.  Postoperative Constipation Protocol  Constipation - defined medically as fewer than three stools per week and severe constipation as less than one stool per week.  One of the most common issues patients have following surgery is constipation. Even if you have a regular bowel pattern at home, your normal regimen is likely to be disrupted due to multiple reasons following surgery. Combination of anesthesia, postoperative narcotics, change in appetite and fluid intake all can affect your bowels. In order to avoid complications following surgery, here are  some recommendations in order to help you during your recovery period.  Colace (docusate) - Pick up an over-the-counter form of Colace or another stool softener and take twice a day as long as you are requiring postoperative pain medications. Take with a full glass of water daily. If you experience loose stools or diarrhea, hold the colace until you stool forms back up. If your symptoms do not get better within 1 week or if they get worse, check with your doctor.  Dulcolax (bisacodyl) - Pick up over-the-counter and take as directed by the product packaging as needed to assist with the movement of your bowels. Take with a full glass of water. Use this product as needed if not relieved by Colace only.   MiraLax (polyethylene glycol) - Pick up over-the-counter to have on hand. MiraLax is a solution that will increase the amount of water in your bowels to assist with bowel movements. Take as directed and can mix with a glass of water, juice, soda, coffee, or tea. Take if you go more than two days without a movement. Do not use MiraLax more than once per day. Call your doctor if you are still constipated or irregular after using this medication for 7 days in a row.  If you continue to have problems  with postoperative constipation, please contact the office for further assistance and recommendations. If you experience "the worst abdominal pain ever" or develop nausea or vomiting, please contact the office immediatly for further recommendations for treatment.  NO BENDING TO THE LEFT KNEE. May be Weight Bearing As Tolerated when up ambulating. Avoid going up and down the stairs multiple times throughout the day.  Keep leg elevated and iced when not up ambulating.

## 2015-07-12 NOTE — Progress Notes (Signed)
   Subjective: 3 Days Post-Op Procedure(s) (LRB): LEFT KNEE IRRIGATION AND DEBRIDEMENT WOUND CLOSURE (Left) Patient reports pain as mild.   Patient seen in rounds with Dr. Wynelle Link. Patient is well, and has had no acute complaints or problems Patient is ready to go home today. Will need two weeks of IV Ancef via PICC line.  Objective: Vital signs in last 24 hours: Temp:  [98.2 F (36.8 C)-98.5 F (36.9 C)] 98.4 F (36.9 C) (08/11 0544) Pulse Rate:  [70-75] 74 (08/11 0544) Resp:  [16-18] 16 (08/11 0544) BP: (121-137)/(53-58) 131/58 mmHg (08/11 0544) SpO2:  [97 %-100 %] 98 % (08/11 0544)  Intake/Output from previous day:  Intake/Output Summary (Last 24 hours) at 07/12/15 0703 Last data filed at 07/12/15 0300  Gross per 24 hour  Intake   1030 ml  Output    800 ml  Net    230 ml    Labs:  Recent Labs  07/10/15 0515 07/11/15 0444 07/12/15 0445  HGB 11.1* 9.5* 9.7*    Recent Labs  07/11/15 0444 07/12/15 0445  WBC 19.9* 11.5*  RBC 3.19* 3.26*  HCT 31.2* 32.1*  PLT 232 217    Recent Labs  07/10/15 0515 07/11/15 0444  NA 134* 139  K 4.8 4.4  CL 101 102  CO2 27 30  BUN 16 20  CREATININE 1.05 1.10  GLUCOSE 259* 239*  CALCIUM 8.5* 8.4*   No results for input(s): LABPT, INR in the last 72 hours.  EXAM: General - Patient is Alert and Appropriate Extremity - Neurovascular intact Sensation intact distally Dorsiflexion/Plantar flexion intact Incision - clean, dry, healed, staple sintact Motor Function - intact, moving foot and toes well on exam.   Assessment/Plan: 3 Days Post-Op Procedure(s) (LRB): LEFT KNEE IRRIGATION AND DEBRIDEMENT WOUND CLOSURE (Left) Procedure(s) (LRB): LEFT KNEE IRRIGATION AND DEBRIDEMENT WOUND CLOSURE (Left) Past Medical History  Diagnosis Date  . Chronic headaches   . Type 2 diabetes mellitus   . Essential hypertension   . Diverticulitis   . Syncope   . Hypercholesteremia   . GI bleed     Dr. Laural Golden - 1998  . Peptic ulcer  disease   . Anemia, iron deficiency   . Depression   . Chronic kidney disease   . Peripheral neuropathy    Active Problems:   Postoperative wound dehiscence  Estimated body mass index is 25.92 kg/(m^2) as calculated from the following:   Height as of this encounter: 6\' 2"  (1.88 m).   Weight as of this encounter: 91.627 kg (202 lb). Discharge home Diet - Cardiac diet, Diabetic diet and Renal diet Follow up - in 5 days on Tuesday 07/17/2015 Activity - WBAT, NO BENDING OF KNEE FOR TWO WEEKS. May be WBAT when up ambulating. Disposition - Home Condition Upon Discharge - Stable D/C Meds - See DC Summary DVT Prophylaxis - Aspirin  Arlee Muslim, PA-C Orthopaedic Surgery 07/12/2015, 7:03 AM

## 2015-07-12 NOTE — Progress Notes (Signed)
Physical Therapy Treatment Note    07/12/15 1500  PT Visit Information  Last PT Received On 07/12/15  Assistance Needed +1  History of Present Illness s/p L knee I and D; PMHx: L TKA in June 2016, HTN, spinal fusions  PT Time Calculation  PT Start Time (ACUTE ONLY) 1507  PT Stop Time (ACUTE ONLY) 1527  PT Time Calculation (min) (ACUTE ONLY) 20 min  Subjective Data  Subjective Pt's daughter present and educated on KI, wearing at all times, no knee motion.  Pt ambulated and practiced steps with daughter holding RW.  Pt aware not to perform steps at home without assist for safety.  Stair handout provided.  Daughter reports hospital bed hopefully delivered today however pt can sleep on couch if needed.  Pt and daughter had no further questions and ready to d/c home.  Precautions  Precautions Fall  Precaution Comments NO ROM L knee  Required Braces or Orthoses Other Brace/Splint;Knee Immobilizer - Right  Knee Immobilizer - Left On at all times  Restrictions  Other Position/Activity Restrictions WBAT  Pain Assessment  Pain Assessment 0-10  Pain Score 3  Pain Location L knee  Pain Descriptors / Indicators Aching;Sore  Pain Intervention(s) Limited activity within patient's tolerance;Monitored during session;Repositioned  Cognition  Arousal/Alertness Awake/alert  Behavior During Therapy WFL for tasks assessed/performed  Overall Cognitive Status Within Functional Limits for tasks assessed  Bed Mobility  Overal bed mobility Needs Assistance  Bed Mobility Supine to Sit  Supine to sit Supervision  Transfers  Overall transfer level Needs assistance  Equipment used Rolling walker (2 wheeled)  Transfers Sit to/from Stand  Sit to Stand Min guard  General transfer comment cues for hand placement and LLE position  Ambulation/Gait  Ambulation/Gait assistance Min guard  Ambulation Distance (Feet) 120 Feet  Assistive device Rolling walker (2 wheeled)  Gait Pattern/deviations Step-through  pattern;Antalgic  General Gait Details verbal cues for posture and RW positioning  Stairs Yes  Stairs assistance Min guard  Stair Management Step to pattern;Backwards;With walker  Number of Stairs 3  General stair comments verbal cues for pt and daughter on technique, sequence, RW positioning, safety, performed twice  PT - End of Session  Equipment Utilized During Treatment Gait belt;Left knee immobilizer  Activity Tolerance Patient tolerated treatment well  Patient left in chair;with call bell/phone within reach;with family/visitor present  PT - Assessment/Plan  PT Plan Current plan remains appropriate  PT Frequency (ACUTE ONLY) Min 6X/week  Follow Up Recommendations Home health PT  PT equipment None recommended by PT  PT Goal Progression  Progress towards PT goals Progressing toward goals  PT General Charges  $$ ACUTE PT VISIT 1 Procedure  PT Treatments  $Gait Training 8-22 mins   Carmelia Bake, PT, DPT 07/12/2015 Pager: [phone]

## 2015-07-14 LAB — ANAEROBIC CULTURE

## 2015-07-17 LAB — CULTURE, BLOOD (SINGLE)

## 2015-09-12 ENCOUNTER — Ambulatory Visit (INDEPENDENT_AMBULATORY_CARE_PROVIDER_SITE_OTHER): Payer: Medicare Other | Admitting: Psychiatry

## 2015-09-12 ENCOUNTER — Encounter (HOSPITAL_COMMUNITY): Payer: Self-pay | Admitting: Psychiatry

## 2015-09-12 VITALS — BP 118/60 | HR 69 | Ht 74.0 in | Wt 210.8 lb

## 2015-09-12 DIAGNOSIS — F332 Major depressive disorder, recurrent severe without psychotic features: Secondary | ICD-10-CM

## 2015-09-12 MED ORDER — VENLAFAXINE HCL ER 150 MG PO CP24: 150.0000 mg | ORAL_CAPSULE | Freq: Every day | ORAL | Status: DC

## 2015-09-12 MED ORDER — CLONAZEPAM 1 MG PO TABS: 1.0000 mg | ORAL_TABLET | Freq: Every day | ORAL | Status: DC

## 2015-09-12 NOTE — Progress Notes (Signed)
Patient ID: Jared Bridges, male   DOB: [birth year], 81 y.o.   MRN: [id number] Patient ID: Jared Bridges, male   DOB: 1930-03-31, 81 y.o.   MRN: [id number] Patient ID: Jared Bridges, male   DOB: 11-11-1930, 81 y.o.   MRN: [id number]  Psychiatric Assessment Adult  Patient Identification:  Jared Bridges Date of Evaluation:  09/12/2015 Chief Complaint: I've had recurrent bouts of depression History of Chief Complaint:   Chief Complaint  Patient presents with  . Depression  . Anxiety  . Follow-up    Depression        Associated symptoms include appetite change and headaches.  Past medical history includes anxiety.   Anxiety     this patient is an 81 year old married white male who lives with his wife and daughter in [place]. He is a retired Scientific laboratory technician. The patient was referred by his primary physician, Dr. [name], for further assessment treatment of depression.  The patient states that he's had depression on and off over the years. He has brother and father owns several businesses. When he was 32 years old his father's businesses got into financial trouble and he and his brother had to buy them out. This was very difficult for him financially and personally and he went through a severe depressive episode at that time. He has been on and off medications for many years and he doesn't recall the names. He had E[facility] done at [facility] 2 years ago but he does not feel like it was helpful. Most recently he seen a doctor at [facility] who has put him on Effexor and it does seem to be working. He got increasingly depressed recently because his wife broke her hip in early January and had to go into rehabilitation in a nursing home. He has always had difficulty sleeping and this seems to be worse recently as well.  The patient also has had numerous health problems. He's had peripheral neuropathy for unknown reasons. Lately his walking is gotten worse and he is using a cane. He's had numerous knee surgeries back surgery.  His balance is not the best. He is upset because his daughters had to leave her job to help take care of himself and his wife. He denies being suicidal but has down periods at times. Currently sitting on Effexor 225 mg a day and is gradually trying to get up to 300 mg daily. He is on Lunesta but it's not helping sleep. Valium sometimes helpful causes a hangover. He's never been suicidal or psychotic and does not use drugs or alcohol .  The patient returns after 79 months. Since I last saw him he has had right knee replacement which got complicated by infection and he had to have a recurrent surgery in August. All this is knocked him down a bit. His knee is totally healed now. He supposed to be doing physical therapy but he didn't feel strong and often canceled last week. His daughters with him and states that he is been more depressed and staying in bed all the time. Somehow his Effexor dose got reduced it needs to be back up at 79. He is only taking clonazepam at bedtime. He does feel discouraged but tells me today that he is going to try a little harder and start going to physical therapy.  Review of Systems  Constitutional: Positive for activity change and appetite change.  Eyes: Negative.   Respiratory: Negative.   Cardiovascular: Negative.   Gastrointestinal: Negative.   Endocrine:  Negative.   Genitourinary: Negative.   Musculoskeletal: Positive for joint swelling, arthralgias and gait problem.  Skin: Negative.   Allergic/Immunologic: Negative.   Neurological: Positive for tremors and headaches.  Hematological: Negative.   Psychiatric/Behavioral: Positive for depression, sleep disturbance and dysphoric mood.   Physical Examnot done  Depressive Symptoms: depressed mood, anhedonia, insomnia, psychomotor retardation, fatigue, loss of energy/fatigue, weight loss,  (Hypo) Manic Symptoms:   Elevated Mood:  No Irritable Mood:  No Grandiosity:  No Distractibility:  No Labiality of  Mood:  No Delusions:  No Hallucinations:  No Impulsivity:  No Sexually Inappropriate Behavior:  No Financial Extravagance:  No Flight of Ideas:  No  Anxiety Symptoms: Excessive Worry:  Yes Panic Symptoms:  No Agoraphobia:  No Obsessive Compulsive: No  Symptoms: None, Specific Phobias:  No Social Anxiety:  No  Psychotic Symptoms:  Hallucinations: No None Delusions:  No Paranoia:  No   Ideas of Reference:  No  PTSD Symptoms: Ever had a traumatic exposure:  No Had a traumatic exposure in the last month:  No Re-experiencing: No None Hypervigilance:  No Hyperarousal: No None Avoidance: No None  Traumatic Brain Injury: Yes Sports Related  Past Psychiatric History: Diagnosis: Major depression   Hospitalizations: none  Outpatient Care: Primarily at [facility]   Substance Abuse Care: none  Self-Mutilation:none  Suicidal Attempts: none  Violent Behaviors: none   Past Medical History:   Past Medical History  Diagnosis Date  . Chronic headaches   . Type 2 diabetes mellitus ([place])   . Essential hypertension   . Diverticulitis   . Syncope   . Hypercholesteremia   . GI bleed     Dr. Laural Golden - 1998  . Peptic ulcer disease   . Anemia, iron deficiency   . Depression   . Chronic kidney disease   . Peripheral neuropathy ([name]    History of Loss of Consciousness:  Yes Seizure History:  No Cardiac History:  Yes Allergies:   Allergies  Allergen Reactions  . Iodinated Diagnostic Agents Hives and Itching    Other reaction(s): Other (See Comments) Other Reaction: iv contrast=hives  . Cymbalta [Duloxetine Hcl] Other (See Comments)    Makes patient have jerking motions.  . Iodine Hives    very allergic(per daughter)  . Latex Other (See Comments)    Redness iritation  . Lisinopril Other (See Comments)    Makes patient have jerking motions.  . Neurontin [Gabapentin] Other (See Comments)    Makes patient have jerking motions.  . Tape Other (See Comments)    Skin irritation  .  Zoloft [Sertraline Hcl] Other (See Comments) and Diarrhea    Makes patient have jerking motions.   Current Medications:  Current Outpatient Prescriptions  Medication Sig Dispense Refill  . azithromycin (ZITHROMAX) 250 MG tablet Take 250 mg by mouth daily.    . Cholecalciferol (VITAMIN D3) 5000 UNITS TABS Take 5,000 Units by mouth at bedtime.    . clonazePAM (KLONOPIN) 1 MG tablet Take 1 tablet (1 mg total) by mouth at bedtime. 30 tablet 2  . diclofenac sodium (VOLTAREN) 1 % GEL Apply topically as needed.    . divalproex (DEPAKOTE) 250 MG DR tablet Take 1 tablet (250 mg total) by mouth Nightly. (Patient taking differently: Take 250 mg by mouth every morning. ) 30 tablet 5  . feeding supplement, GLUCERNA SHAKE, (GLUCERNA SHAKE) LIQD Take 237 mLs by mouth 2 (two) times daily between meals.  0  . HYDROcodone-acetaminophen (NORCO/VICODIN) 5-325 MG per tablet Take 1-2 tablets  by mouth every 4 (four) hours as needed for moderate pain. 80 tablet 0  . LYRICA 50 MG capsule Take 25 mg by mouth at bedtime.   1  . methocarbamol (ROBAXIN) 500 MG tablet Take 1 tablet (500 mg total) by mouth every 6 (six) hours as needed for muscle spasms. 80 tablet 0  . predniSONE (DELTASONE) 5 MG tablet Take 5 mg by mouth. Taking Every Other morning for PMR Arthritis  1  . traMADol (ULTRAM) 50 MG tablet Take 1-2 tablets (50-100 mg total) by mouth every 6 (six) hours as needed (mild pain). 60 tablet 1  . aspirin EC 325 MG EC tablet Take 1 tablet (325 mg total) by mouth daily with breakfast. 30 tablet 0  . glimepiride (AMARYL) 1 MG tablet Take 1 mg by mouth daily as needed (if blood sugar is above 150).     . venlafaxine XR (EFFEXOR-XR) 150 MG 24 hr capsule Take 1 capsule (150 mg total) by mouth daily. 30 capsule 2   No current facility-administered medications for this visit.    Previous Psychotropic Medications:  Medication Dose                         Substance Abuse History in the last 12 months: Substance  Age of 1st Use Last Use Amount Specific Type  Nicotine      Alcohol      Cannabis      Opiates      Cocaine      Methamphetamines      LSD      Ecstasy      Benzodiazepines      Caffeine      Inhalants      Others:                          Medical Consequences of Substance Abuse: none  Legal Consequences of Substance Abuse: none  Family Consequences of Substance Abuse: none Blackouts:  No DT's:  No Withdrawal Symptoms:  No None  Social History: Current Place of Residence: [place] of Birth: [place]  Family Members: Wife and daughter Marital Status:  Married Children:   Sons:   Daughters: 1 Relationships: Education:  Soil scientist Problems/Performance:  Religious Beliefs/Practices: Christian History of Abuse: none Pensions consultant; Land Economist History:  Tax inspector History:  Hobbies/Interests: Woodworking   Family History:   Family History  Problem Relation Age of Onset  . Hypertension    . Depression Brother   . Depression Daughter     Mental Status Examination/Evaluation: Objective:  Appearance: Casual, Neat and Well Groomed  Eye Contact::  Good  Speech:  Clear and Coherent  Volume:  Normal  Mood: Anxious, little shaky   Affect:  Appropriate  Thought Process:  Circumstantial and Goal Directed  Orientation:  Full (Time, Place, and Person)  Thought Content:  Rumination  Suicidal Thoughts:  No  Homicidal Thoughts:  No  Judgement:  Good  Insight:  Good  Psychomotor Activity:  Decreased  Akathisia:  No  Handed:  Right  AIMS (if indicated):    Assets:  Communication Skills Desire for Improvement Resilience Social Support Talents/Skills Vocational/Educational    Laboratory/X-Ray Psychological Evaluation(s)   Blood sugar moderately elevated on last reading      Assessment:  Axis I: Major Depression, Recurrent severe  AXIS I Major Depression, Recurrent severe  AXIS II Deferred  AXIS  III  Past Medical History  Diagnosis Date  . Chronic headaches   . Type 2 diabetes mellitus ([place])   . Essential hypertension   . Diverticulitis   . Syncope   . Hypercholesteremia   . GI bleed     Dr. Laural Golden - 1998  . Peptic ulcer disease   . Anemia, iron deficiency   . Depression   . Chronic kidney disease   . Peripheral neuropathy ([name]      AXIS IV other psychosocial or environmental problems  AXIS V 51-60 moderate symptoms   Treatment Plan/Recommendations:  Plan of Care: Medication management   Laboratory:   Psychotherapy: He is not sure about this at this time   Medications he Jared restart Effexor XR to 150 mg twice a day. He Jared continue clonazepam 1 mg at bedtime   Routine PRN Medications:  No  Consultations:   Safety Concerns:  He denies thoughts of harming self or others   Other:  He'll return in 4 weeks     Levonne Spiller, MD 10/12/201612:17 PM

## 2015-10-08 ENCOUNTER — Emergency Department (HOSPITAL_COMMUNITY): Payer: Medicare Other

## 2015-10-08 ENCOUNTER — Encounter (HOSPITAL_COMMUNITY): Payer: Self-pay | Admitting: Emergency Medicine

## 2015-10-08 ENCOUNTER — Emergency Department (HOSPITAL_COMMUNITY)
Admission: EM | Admit: 2015-10-08 | Discharge: 2015-10-08 | Disposition: A | Discharge status: Home / Self Care | Payer: Medicare Other | Attending: Emergency Medicine | Admitting: Emergency Medicine

## 2015-10-08 DIAGNOSIS — E119 Type 2 diabetes mellitus without complications: Secondary | ICD-10-CM | POA: Diagnosis not present

## 2015-10-08 DIAGNOSIS — I129 Hypertensive chronic kidney disease with stage 1 through stage 4 chronic kidney disease, or unspecified chronic kidney disease: Secondary | ICD-10-CM | POA: Diagnosis not present

## 2015-10-08 DIAGNOSIS — Z7952 Long term (current) use of systemic steroids: Secondary | ICD-10-CM | POA: Insufficient documentation

## 2015-10-08 DIAGNOSIS — Z862 Personal history of diseases of the blood and blood-forming organs and certain disorders involving the immune mechanism: Secondary | ICD-10-CM | POA: Insufficient documentation

## 2015-10-08 DIAGNOSIS — S50812A Abrasion of left forearm, initial encounter: Secondary | ICD-10-CM | POA: Insufficient documentation

## 2015-10-08 DIAGNOSIS — N189 Chronic kidney disease, unspecified: Secondary | ICD-10-CM | POA: Insufficient documentation

## 2015-10-08 DIAGNOSIS — Z8711 Personal history of peptic ulcer disease: Secondary | ICD-10-CM | POA: Insufficient documentation

## 2015-10-08 DIAGNOSIS — W1839XA Other fall on same level, initial encounter: Secondary | ICD-10-CM | POA: Insufficient documentation

## 2015-10-08 DIAGNOSIS — G8929 Other chronic pain: Secondary | ICD-10-CM | POA: Diagnosis not present

## 2015-10-08 DIAGNOSIS — Z79899 Other long term (current) drug therapy: Secondary | ICD-10-CM | POA: Diagnosis not present

## 2015-10-08 DIAGNOSIS — W19XXXA Unspecified fall, initial encounter: Secondary | ICD-10-CM

## 2015-10-08 DIAGNOSIS — Z91040 Latex allergy status: Secondary | ICD-10-CM | POA: Insufficient documentation

## 2015-10-08 DIAGNOSIS — Y9389 Activity, other specified: Secondary | ICD-10-CM | POA: Diagnosis not present

## 2015-10-08 DIAGNOSIS — F329 Major depressive disorder, single episode, unspecified: Secondary | ICD-10-CM | POA: Diagnosis not present

## 2015-10-08 DIAGNOSIS — Y998 Other external cause status: Secondary | ICD-10-CM | POA: Insufficient documentation

## 2015-10-08 DIAGNOSIS — Z9181 History of falling: Secondary | ICD-10-CM | POA: Diagnosis not present

## 2015-10-08 DIAGNOSIS — Y9289 Other specified places as the place of occurrence of the external cause: Secondary | ICD-10-CM | POA: Diagnosis not present

## 2015-10-08 DIAGNOSIS — Z87891 Personal history of nicotine dependence: Secondary | ICD-10-CM | POA: Diagnosis not present

## 2015-10-08 DIAGNOSIS — S59912A Unspecified injury of left forearm, initial encounter: Secondary | ICD-10-CM | POA: Diagnosis present

## 2015-10-08 DIAGNOSIS — G629 Polyneuropathy, unspecified: Secondary | ICD-10-CM | POA: Diagnosis not present

## 2015-10-08 LAB — COMPREHENSIVE METABOLIC PANEL
ALBUMIN: 3.5 g/dL (ref 3.5–5.0)
ALK PHOS: 69 U/L (ref 38–126)
ALT: 15 U/L — AB (ref 17–63)
ANION GAP: 10 (ref 5–15)
AST: 19 U/L (ref 15–41)
BILIRUBIN TOTAL: 0.4 mg/dL (ref 0.3–1.2)
BUN: 20 mg/dL (ref 6–20)
CALCIUM: 9 mg/dL (ref 8.9–10.3)
CO2: 29 mmol/L (ref 22–32)
CREATININE: 1.1 mg/dL (ref 0.61–1.24)
Chloride: 99 mmol/L — ABNORMAL LOW (ref 101–111)
GFR calc Af Amer: >60 mL/min (ref >60)
GFR calc non Af Amer: >60 mL/min (ref >60)
GLUCOSE: 184 mg/dL — AB (ref 65–99)
Potassium: 4.2 mmol/L (ref 3.5–5.1)
SODIUM: 138 mmol/L (ref 135–145)
Total Protein: 7 g/dL (ref 6.5–8.1)

## 2015-10-08 LAB — CBC WITH DIFFERENTIAL/PLATELET
BASOS ABS: 0 10*3/uL (ref 0.0–0.1)
BASOS PCT: 0 %
EOS ABS: 0.4 10*3/uL (ref 0.0–0.7)
Eosinophils Relative: 4 %
HEMATOCRIT: 40.2 % (ref 39.0–52.0)
HEMOGLOBIN: 12.6 g/dL — AB (ref 13.0–17.0)
Lymphocytes Relative: 15 %
Lymphs Abs: 1.4 10*3/uL (ref 0.7–4.0)
MCH: 29.6 pg (ref 26.0–34.0)
MCHC: 31.3 g/dL (ref 30.0–36.0)
MCV: 94.6 fL (ref 78.0–100.0)
Monocytes Absolute: 0.9 10*3/uL (ref 0.1–1.0)
Monocytes Relative: 9 %
NEUTROS ABS: 7.2 10*3/uL (ref 1.7–7.7)
Neutrophils Relative %: 72 %
Platelets: 241 10*3/uL (ref 150–400)
RBC: 4.25 MIL/uL (ref 4.22–5.81)
RDW: 14.3 % (ref 11.5–15.5)
WBC: 10 10*3/uL (ref 4.0–10.5)

## 2015-10-08 LAB — TROPONIN I: Troponin I: <0.03 ng/mL (ref <0.031)

## 2015-10-08 MED ORDER — BACITRACIN ZINC 500 UNIT/GM EX OINT
1.0000 | TOPICAL_OINTMENT | Freq: Two times a day (BID) | CUTANEOUS | Status: DC
Start: 2015-10-08 — End: 2015-12-20

## 2015-10-08 MED ORDER — BACITRACIN ZINC 500 UNIT/GM EX OINT
TOPICAL_OINTMENT | Freq: Once | CUTANEOUS | Status: AC
  Administered 2015-10-08: 19:00:00 via TOPICAL
  Filled 2015-10-08: qty 1.8

## 2015-10-08 NOTE — ED Provider Notes (Signed)
CSN: [id number]     Arrival date & time 10/08/15  1130 History   First MD Initiated Contact with Patient 10/08/15 1528     Chief Complaint  Patient presents with  . Fall     (Consider location/radiation/quality/duration/timing/severity/associated sxs/prior Treatment) HPI Comments: The patient is an 81 year old male, he has known history of headaches, type 2 diabetes, diverticulitis, peptic ulcer disease and GI bleeding and peripheral neuropathy. His family accompanies him to the hospital today stating that he has had several falls over the last month, 2 of these falls were at nighttime, one of the falls was 2 days ago while he was cutting tree limbs with a chainsaw. He does not recall the exact mechanism of his fall, he denies passing out, he thinks he may have stumbled or had a mechanical fall but he is unsure. Since that time he has been able to ambulate, he does have some injuries to his left arm which are superficial and which were bandaged by his daughter. He is currently under the care of a local internal medicine doctor and has had a transcranial Doppler and carotid ultrasound which did not reveal any specific carotid stenosis. The patient denies chest pain, shortness of breath, palpitations, swelling and denies any trouble with coordination when he is at rest. He has no shortness of breath, abdominal pain, nausea vomiting or diarrhea.  Patient is a 81 y.o. male presenting with fall. The history is provided by the patient.  Fall    Past Medical History  Diagnosis Date  . Chronic headaches   . Type 2 diabetes mellitus ([place])   . Essential hypertension   . Diverticulitis   . Syncope   . Hypercholesteremia   . GI bleed     Dr. Laural Golden - 1998  . Peptic ulcer disease   . Anemia, iron deficiency   . Depression   . Chronic kidney disease   . Peripheral neuropathy [facility])    Past Surgical History  Procedure Laterality Date  . Uvulectomy  2004  . Spinal fusion    . Spinal fusion    .  Colon surgery      FOR DIVERTICULOSIS  . Tonsillectomy    . Eye surgery      right cataract with lens implant  . Appendectomy      as child  . Hernia repair      1-right inguinal, 3- left inguinal  . Total knee arthroplasty Left 05/28/2015    Procedure: LEFT TOTAL KNEE ARTHROPLASTY;  Surgeon: Gaynelle Arabian, MD;  Location: [facility];  Service: Orthopedics;  Laterality: Left;  . Irrigation and debridement knee Left 07/09/2015    Procedure: LEFT KNEE IRRIGATION AND DEBRIDEMENT WOUND CLOSURE;  Surgeon: Gaynelle Arabian, MD;  Location: [facility];  Service: Orthopedics;  Laterality: Left;   Family History  Problem Relation Age of Onset  . Hypertension    . Depression Brother   . Depression Daughter    Social History  Substance Use Topics  . Smoking status: Former Smoker -- 0.10 packs/day    Types: Cigarettes    Start date: 12/18/1958    Quit date: 12/01/1988  . Smokeless tobacco: Never Used  . Alcohol Use: No    Review of Systems  All other systems reviewed and are negative.     Allergies  Iodinated diagnostic agents; Cymbalta; Iodine; Latex; Lisinopril; Neurontin; Tape; and Zoloft  Home Medications   Prior to Admission medications   Medication Sig Start Date End Date Taking? Authorizing Provider  Cholecalciferol (  VITAMIN D3) 5000 UNITS TABS Take 5,000 Units by mouth at bedtime.   Yes Historical Provider, MD  clonazePAM (KLONOPIN) 1 MG tablet Take 1 tablet (1 mg total) by mouth at bedtime. 09/12/15 09/11/16 Yes Cloria Spring, MD  diazepam (VALIUM) 5 MG tablet Take 2.5-5 mg by mouth every 6 (six) hours as needed for anxiety.   Yes Historical Provider, MD  diclofenac sodium (VOLTAREN) 1 % GEL Apply 2 g topically daily as needed (for pain).    Yes Historical Provider, MD  divalproex (DEPAKOTE) 250 MG DR tablet Take 1 tablet (250 mg total) by mouth Nightly. Patient taking differently: Take 250 mg by mouth every morning.  05/22/15  Yes Larey Seat, MD  feeding supplement, GLUCERNA  SHAKE, (GLUCERNA SHAKE) LIQD Take 237 mLs by mouth 2 (two) times daily between meals. 08/18/14  Yes Radene Gunning, NP  HYDROcodone-acetaminophen (NORCO/VICODIN) 5-325 MG per tablet Take 1-2 tablets by mouth every 4 (four) hours as needed for moderate pain. Patient taking differently: Take 0.5-1 tablets by mouth every 6 (six) hours as needed for moderate pain.  07/12/15  Yes Arlee Muslim, PA-C  predniSONE (DELTASONE) 5 MG tablet Take 5 mg by mouth every other day. Taking Every Other morning for PMR Arthritis 10/03/14  Yes Historical Provider, MD  pregabalin (LYRICA) 25 MG capsule Take 25 mg by mouth at bedtime.   Yes Historical Provider, MD  traMADol (ULTRAM) 50 MG tablet Take 1-2 tablets (50-100 mg total) by mouth every 6 (six) hours as needed (mild pain). 07/12/15  Yes Arlee Muslim, PA-C  venlafaxine XR (EFFEXOR-XR) 150 MG 24 hr capsule Take 150 mg by mouth every morning.   Yes Historical Provider, MD  bacitracin ointment Apply 1 application topically 2 (two) times daily. 10/08/15   Noemi Chapel, MD  methocarbamol (ROBAXIN) 500 MG tablet Take 1 tablet (500 mg total) by mouth every 6 (six) hours as needed for muscle spasms. Patient not taking: Reported on 10/08/2015 07/12/15   Arlee Muslim, PA-C  venlafaxine XR (EFFEXOR-XR) 150 MG 24 hr capsule Take 1 capsule (150 mg total) by mouth daily. Patient not taking: Reported on 10/08/2015 09/12/15 09/11/16  Cloria Spring, MD   BP 149/57 mmHg  Pulse 62  Temp(Src) 98.2 F (36.8 C) (Oral)  Resp 17  Ht 6\' 2"  (1.88 m)  Wt 192 lb (87.091 kg)  BMI 24.64 kg/m2  SpO2 96% Physical Exam  Constitutional: He appears well-developed and well-nourished. No distress.  HENT:  Head: Normocephalic and atraumatic.  Mouth/Throat: Oropharynx is clear and moist. No oropharyngeal exudate.  no facial tenderness, deformity, malocclusion or hemotympanum.  no battle's sign or racoon eyes.   Eyes: Conjunctivae and EOM are normal. Pupils are equal, round, and reactive to light.  Right eye exhibits no discharge. Left eye exhibits no discharge. No scleral icterus.  Neck: Normal range of motion. Neck supple. No JVD present. No thyromegaly present.  Cardiovascular: Normal rate, regular rhythm, normal heart sounds and intact distal pulses.  Exam reveals no gallop and no friction rub.   No murmur heard. Pulmonary/Chest: Effort normal and breath sounds normal. No respiratory distress. He has no wheezes. He has no rales.  Abdominal: Soft. Bowel sounds are normal. He exhibits no distension and no mass. There is no tenderness.  Musculoskeletal: Normal range of motion. He exhibits tenderness. He exhibits no edema.   Mild tenderness over the left elbow with range of motion, no obvious deformity, multiple superficial skin tears of the left forearm  Lymphadenopathy:  He has no cervical adenopathy.  Neurological: He is alert. Coordination normal.  Normal speech, normal coordination, normal finger-nose-finger, no pronator drift, cranial nerves III through XII are normal - has slight diplopia when he looks to the L - has been present for 2 weeks.   Skin: Skin is warm and dry. No rash noted. No erythema.  Skin tears present  Psychiatric: He has a normal mood and affect. His behavior is normal.  Nursing note and vitals reviewed.   ED Course  Procedures (including critical care time) Labs Review Labs Reviewed  CBC WITH DIFFERENTIAL/PLATELET - Abnormal; Notable for the following:    Hemoglobin 12.6 (*)    All other components within normal limits  COMPREHENSIVE METABOLIC PANEL - Abnormal; Notable for the following:    Chloride 99 (*)    Glucose, Bld 184 (*)    ALT 15 (*)    All other components within normal limits  TROPONIN I    Imaging Review Dg Elbow Complete Left  10/08/2015  CLINICAL DATA:  Fall 2 days ago with elbow pain, initial encounter EXAM: LEFT ELBOW - COMPLETE 3+ VIEW COMPARISON:  None. FINDINGS: There is no evidence of fracture, dislocation, or joint effusion.  No soft tissue abnormality is noted. Mild spurring from the olecranon is seen. No joint effusion is noted IMPRESSION: No acute abnormality seen. Electronically Signed   By: Inez Catalina M.D.   On: 10/08/2015 16:57   Mr Brain Wo Contrast  10/08/2015  CLINICAL DATA:  Weakness and difficulty walking for 3 days. Multiple falls. EXAM: MRI HEAD WITHOUT CONTRAST TECHNIQUE: Multiplanar, multiecho pulse sequences of the brain and surrounding structures were obtained without intravenous contrast. COMPARISON:  CT head 08/16/2014. FINDINGS: The patient was unable to remain motionless for the exam. Small or subtle lesions could be overlooked. No evidence for acute infarction, hemorrhage, mass lesion, or extra-axial fluid. Advanced cerebral and cerebellar atrophy. Moderately extensive chronic microvascular ischemic change. Hydrocephalus ex vacuo. Flow voids are maintained throughout the carotid, basilar, and vertebral arteries. There are no areas of chronic hemorrhage. Pituitary, pineal, and cerebellar tonsils unremarkable. No upper cervical lesions. Visualized calvarium, skull base, and upper cervical osseous structures unremarkable. Scalp and extracranial soft tissues, orbits, sinuses, and mastoids show no acute process. RIGHT cataract extraction. IMPRESSION: Atrophy and small vessel disease. No acute intracranial findings. Electronically Signed   By: Staci Righter M.D.   On: 10/08/2015 16:57   I have personally reviewed and evaluated these images and lab results as part of my medical decision-making.   EKG Interpretation   Date/Time:  Monday October 08 2015 16:55:45 EST Ventricular Rate:  63 PR Interval:  225 QRS Duration: 85 QT Interval:  439 QTC Calculation: 449 R Axis:   -35 Text Interpretation:  Sinus rhythm Prolonged PR interval Inferior infarct,  old Consider anterior infarct Baseline wander in lead(s) V6 Since last  tracing rate slower Confirmed by Emir Nack  MD, Linken Mcglothen ([id number]) on 10/08/2015  6:08:33  PM      MDM   Final diagnoses:  Forearm abrasion, left, initial encounter  Falls, initial encounter    There is no exact cause for the patient's falls on exam, this could be related to a stroke, this could be related to imbalance, he is 81 years old and tends to be going downhill. I do not see any medications that would cause imbalance, we'll obtain MRI and labs to rule out other sources. Patient in agreement, local wound care given, will update tetanus as needed.  MRI  and labs are unremarkable for any acute findings, the patient's vital signs remain normal, his mental status and exam remained normal, wound care was given, the patient appears stable for discharge.  This was discussed with the patient's family doctor as well as with the family members, all are in agreement.    Noemi Chapel, MD 10/08/15 [phone]

## 2015-10-08 NOTE — ED Notes (Signed)
Pt fell on Saturday -- ( has hx of falls ) was holding a chain saw at time, for past 2  years has been having fall ,however past 3 weeks has had 3 falls for various reasons-  Pt concerned about fat that he has been having an increase in his falls lately- Pt has various areas that are hurting -- also has areas on his Lt arm that he has covered at this time

## 2015-10-08 NOTE — Discharge Instructions (Signed)

## 2015-11-06 ENCOUNTER — Institutional Professional Consult (permissible substitution): Payer: Medicare Other | Admitting: Neurology

## 2015-11-06 ENCOUNTER — Telehealth: Payer: Self-pay

## 2015-11-06 NOTE — Telephone Encounter (Signed)
Called to tell pt that his appt today needs to be r/s because Dr. Brett Fairy is out sick. I left messages on both home and cell asking that he please call me back. If pt calls back, please r/s him to a new patient consult spot for 30 minutes.

## 2015-11-22 ENCOUNTER — Ambulatory Visit (INDEPENDENT_AMBULATORY_CARE_PROVIDER_SITE_OTHER): Payer: Medicare Other | Admitting: Neurology

## 2015-11-22 ENCOUNTER — Encounter: Payer: Self-pay | Admitting: Neurology

## 2015-11-22 VITALS — BP 130/76 | HR 74 | Resp 16 | Ht 74.0 in | Wt 205.0 lb

## 2015-11-22 DIAGNOSIS — M353 Polymyalgia rheumatica: Secondary | ICD-10-CM

## 2015-11-22 DIAGNOSIS — G44019 Episodic cluster headache, not intractable: Secondary | ICD-10-CM | POA: Diagnosis not present

## 2015-11-22 DIAGNOSIS — I951 Orthostatic hypotension: Secondary | ICD-10-CM

## 2015-11-22 DIAGNOSIS — G609 Hereditary and idiopathic neuropathy, unspecified: Secondary | ICD-10-CM

## 2015-11-22 DIAGNOSIS — G43111 Migraine with aura, intractable, with status migrainosus: Secondary | ICD-10-CM

## 2015-11-22 DIAGNOSIS — G903 Multi-system degeneration of the autonomic nervous system: Secondary | ICD-10-CM | POA: Diagnosis not present

## 2015-11-22 MED ORDER — DICLOFENAC SODIUM 1 % TD GEL: 2.0000 g | Freq: Every day | TRANSDERMAL | Status: DC | PRN

## 2015-11-22 MED ORDER — CLONAZEPAM 1 MG PO TABS: 1.0000 mg | ORAL_TABLET | Freq: Every day | ORAL | Status: DC

## 2015-11-22 MED ORDER — DIVALPROEX SODIUM 250 MG PO DR TAB: 250.0000 mg | DELAYED_RELEASE_TABLET | Freq: Every morning | ORAL | Status: DC

## 2015-11-22 NOTE — Patient Instructions (Addendum)
Valproic Acid, Divalproex Sodium sprinkle capsule What is this medicine? DIVALPROEX SODIUM (dye VAL pro ex SO dee um) is used to treat certain types of seizures in patients with epilepsy. This medicine may be used for other purposes; ask your health care provider or pharmacist if you have questions. What should I tell my health care provider before I take this medicine? They need to know if you have any of these conditions: -blood disease -brain damage or disease -kidney disease -liver disease -low blood proteins -mitochondrial disease -suicidal thoughts, plans, or attempt; a previous suicide attempt by you or a family member -urea cycle disorder (UCD) -an unusual or allergic reaction to divalproex sodium, other medicines, foods, dyes, or preservatives -pregnant or trying to get pregnant -breast-feeding How should I use this medicine? Take this medicine by mouth. It can be swallowed whole or the capsules may be opened carefully and the contents sprinkled on about one teaspoonful of applesauce or pudding. This mixture must be swallowed immediately. Do not chew or store for later use. Follow the directions on the prescription label. Take your doses at regular intervals. Do not take your medicine more often than directed. Do not stop taking this medicine unless instructed by your doctor or health care professional. Stopping your medicine suddenly can increase your seizures or their severity. Talk to your pediatrician regarding the use of this medicine in children. Special care may be needed. Overdosage: If you think you have taken too much of this medicine contact a poison control center or emergency room at once. NOTE: This medicine is only for you. Do not share this medicine with others. What if I miss a dose? If you miss a dose, take it as soon as you can. If it is almost time for your next dose, take only that dose. Do not take double or extra doses. What may interact with this  medicine? -aspirin -barbiturates, like phenobarbital -diazepam -isoniazid -medicines for depression, anxiety, or psychotic disturbances -medicines that treat or prevent blood clots like warfarin -meropenem -other seizure medicines -rifampin -tolbutamide -zidovudine This list may not describe all possible interactions. Give your health care provider a list of all the medicines, herbs, non-prescription drugs, or dietary supplements you use. Also tell them if you smoke, drink alcohol, or use illegal drugs. Some items may interact with your medicine. What should I watch for while using this medicine? Visit your doctor or health care professional for regular checks on your progress. Wear a Probation officer or necklace. Carry an identification card with information about your condition, medications, and doctor or health care professional. Dennis Bast may get drowsy, dizzy, or have blurred vision. Do not drive, use machinery, or do anything that needs mental alertness until you know how this medicine affects you. To reduce dizzy or fainting spells, do not sit or stand up quickly, especially if you are an older patient. Alcohol can increase drowsiness and dizziness. Avoid alcoholic drinks. This medicine can cause blood problems. This can mean slow healing and a risk of infection. Problems can arise if you need dental work, and in the day to day care of your teeth. Try to avoid damage to your teeth and gums when you brush or floss your teeth. This medicine can make you more sensitive to the sun. Keep out of the sun. If you cannot avoid being in the sun, wear protective clothing and use sunscreen. Do not use sun lamps or tanning beds/booths. The use of this medicine may increase the chance of suicidal  thoughts or actions. Pay special attention to how you are responding while on this medicine. Any worsening of mood, or thoughts of suicide or dying should be reported to your health care professional right  away. Women who become pregnant while using this medicine may enroll in the [place] Pregnancy Registry by calling [phone]. This registry collects information about the safety of antiepileptic drug use during pregnancy. Contact your doctor or healthcare professional if you notice any part of your medicine in your stool. Your healthcare provider may want to check the amount of medicine in your blood if this happens. What side effects may I notice from receiving this medicine? Side effects that you should report to your doctor or health care professional as soon as possible: -allergic reactions like skin rash, itching or hives, swelling of the face, lips, or tongue -changes in the frequency or severity of seizures -double vision or uncontrollable eye movements -nausea and vomiting -redness, blistering, peeling or loosening of the skin, including inside the mouth -stomach pain or cramps -trembling of hands or arms -unusual bleeding or bruising or pinpoint red spots on the skin -unusual swelling of the arms or legs -unusually weak or tired -worsening of mood, thoughts or actions of suicide or dying -yellowing of skin or eyes Side effects that usually do not require medical attention (report to your doctor or health care professional if they continue or are bothersome): -change in menstrual cycle -diarrhea or constipation -headache -loss of bladder control -loss of hair or unusual growth of hair -loss or increase in appetite -weight gain or loss This list may not describe all possible side effects. Call your doctor for medical advice about side effects. You may report side effects to FDA at 1-800-FDA-1088. Where should I keep my medicine? Keep out of reach of children. Store at room temperature below 25 degrees C (77 degrees F). Keep container tightly closed. Throw away any unused medicine after the expiration date. NOTE: This sheet is a summary. It may not  cover all possible information. If you have questions about this medicine, talk to your doctor, pharmacist, or health care provider.    2016, Elsevier/Gold Standard. (2012-07-13 11:53:28)   The patient had years ago and EMG and nerve conduction study at [facility] as well as a sleep study I have not been able to receive copies if the studies older than 5 years I would like to repeat them. CD

## 2015-11-22 NOTE — Progress Notes (Addendum)
SLEEP [facility]INE CLINIC   Provider:  Larey Seat, M D  Referring Provider: Doree Albee, MD Primary Care Physician:  Doree Albee, MD  Chief Complaint  Patient presents with  . Balance Disturbance    HPI:  Jared Bridges is a 81 y.o. male , seen here as a referral from Dr. 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   Dr. Hurshel Party is the referring physician.  He is also followed by Dr. Hector Shade, his orthopedic surgeon. Jared Bridges is using a cane in his left hand to walk and he has noticeable antebrachial petechiae, facial erythema and is unshaven. He wears shoes but no socks.  [name] Kitchen He reports that he attended [employer] and is a former Psychologist, educational.The patient carries a diagnosis of gastroesophageal reflux disease, vitamin D deficiency, non-insulin-dependent diabetes, arthritis and carpal tunnel. He had been tried on verapamil 40 mg for the treatment of migraines, prophylactically but had to discontinue this medication on 04-23-15 due to side effects, he cannot recall. Dr. Anastasio Champion also stated that the patient had some insomnia problems and a history of depression and polymyalgia rheumatica.  He is on maintenance prednisone and he was tried on melatonin to 1 mg caps for sleep but had to discontinue those due to side effects as well.  The patient has chronic intermittent migraine headaches interspersed with spells of cluster headaches. He has a perception of flashing lights preceding the headache onset  AURA - again with light dysesthesia and then grow and occupy his full visual field. This process takes about 30 minutes. He usually has to take pain medication at this time to not suffer from a full-blown migraine. He sees fortification lines that he describes as edges of broken glass, zigzag". He has had these driving , when looking  into the sun, and he describes photophobia and nausea. He has once nearly fainted with a migraine, while he was driving to a dentist appointment. He ended up being seen in an ED, had a CT scan negative and was given aspirin. He is not sure about the date of this event . He reports no hemiparetic or hemisensory loss.  He has also another type of pain that he describes as not a migraine but a real nuisance. He states that these headaches haven't woken him out of sleep in the early morning hours the headaches usually occurred around 4 or 5 AM. reports that a tender spot arises on the left temple" temporal artery. This seems to be his trigger point and only on the left. Describes the pain just as annoying but not stabbing, burning or pulling. There is no specific intensity to the pain and the soreness will last into the next day/ days.  The patient reports a delirium during a urinary infection or urosepsis, about a year ago. He became also static. He had near fainting episodes. He has felt a little unsteady ever since. In the darkness when he cannot visually control his movements he is very unsteady and he has a sinking feeling, a high risk of falling.  He was found to have an aortic aneurysm of the thoracic aorta  in 2008 this is now followed by Dr. Anastasio Champion as well. He denies any chest pains, dyspnea or palpitations.   11/22/2015  Dr. Anastasio Champion  re-referred his patient Jared Bridges today this time for an evaluation of falls  and gait abnormality.  He had worked Jared Bridges up for possible TIAs or strokes and a carotid duplex exam was performed on 06-28-15 which showed minimal to mild plaque and under 59% stenosis of the bulb and ICA bilaterally. The recommendation would usually be an antiplatelet and a cholesterol control medication but not a surgical intervention. A transcranial Doppler was also performed in [place] on the date 06-23-15 the insonated vessels showed no intracranial stenosis sign. There was a  moderate degree of vertebrobasilar insufficiency noted which may explain why the patient has some of the sinking orthostatic hypotension events. Dr. goes Edd Arbour use the term functional gait abnormality chronic with a start date 06-14-15, chronic headaches, polymyalgia rheumatica, vasovagal syncope and diabetes 2 mellitus. The patient's recorded blood pressures have been in normal range through the last 5 visits with his primary care physician. The patient has no chest pains no dyspnea and does not feel palpitations. No tightness of the chest. The patient reports difficulties to get up in AM, he feels stiff -not orthostatic, this symptom is evident in the later day.  He has fainted about 6 times, ambulances were called twice. Each time he was back to normal. One time he had to be hydrated. He had no longer symptoms for several month.   He feels primarily disoriented when he has no visual cues. He has trouble orienting himself in the dark partially because he has peripheral neuropathy which does not allow him to feel the surface he walks on partially because he is hearing impaired and he has some visual challenges as well.  It is difficult for him to sense  the depth of a room or the relationship of different objects to one another without visual orientation. His neuropathy is no longer as painful since he started Voltaren cream, he has actually done very well in terms of pain control. The problem is that what is remaining is numbness. It doesn't help him to get the right feedback for gait stabilization or stance stabilization. There is no medication that'll improve this. What I am left to do in neuropathic treatment is to take the pain away but it is replaced by numbness and not by a normal feeling.  He may feel that she drifts to one side and at sometimes has felt as if he sinks into the ground-  he felt incoordinated and disoriented.  He had fallen in his drive way while trimming a hedge , when the saw fell  and he tried to retrieve it and he fell onto the concrete.      Review of Systems: Out of a complete 14 system review, the patient complains of only the following symptoms, and all other reviewed systems are negative. Dizziness, orthostatic, neuropathy with dysautonomia. Depression GDS 3 points ,  anxiety, dizziness, headaches, frequent insomnia, very easy bruising, joint pain, muscle cramps.  hearing loss and a cardiac murmur.  Epworth score 8 , Fatigue severity score 41 .   Social History   Social History  . Marital Status: Married    Spouse Name: N/A  . Number of Children: N/A  . Years of Education: N/A   Occupational History  . Not on file.   Social History Main Topics  . Smoking status: Former Smoker -- 0.10 packs/day    Types: Cigarettes    Start date: 12/18/1958    Quit date: 12/01/1986  . Smokeless tobacco: Never Used  . Alcohol Use: No  . Drug Use: No  . Sexual Activity: No  Other Topics Concern  . Not on file   Social History Narrative    Family History  Problem Relation Age of Onset  . Hypertension    . Depression Brother   . Depression Daughter   . Colon cancer Sister     Past Medical History  Diagnosis Date  . Chronic headaches   . Type 2 diabetes mellitus ([place])   . Essential hypertension   . Diverticulitis   . Syncope   . Hypercholesteremia   . GI bleed     Dr. Laural Golden - 1998  . Peptic ulcer disease   . Anemia, iron deficiency   . Depression   . Chronic kidney disease   . Peripheral neuropathy ([place])   . Polymyalgia rheumatica ([name]     Past Surgical History  Procedure Laterality Date  . Uvulectomy  2004  . Spinal fusion    . Spinal fusion    . Colon surgery      FOR DIVERTICULOSIS  . Tonsillectomy    . Eye surgery      right cataract with lens implant  . Appendectomy      as child  . Hernia repair      1-right inguinal, 3- left inguinal  . Total knee arthroplasty Left 05/28/2015    Procedure: LEFT TOTAL KNEE ARTHROPLASTY;   Surgeon: Gaynelle Arabian, MD;  Location: [facility];  Service: Orthopedics;  Laterality: Left;  . Irrigation and debridement knee Left 07/09/2015    Procedure: LEFT KNEE IRRIGATION AND DEBRIDEMENT WOUND CLOSURE;  Surgeon: Gaynelle Arabian, MD;  Location: [facility];  Service: Orthopedics;  Laterality: Left;    Current Outpatient Prescriptions  Medication Sig Dispense Refill  . Cholecalciferol (VITAMIN D3) 5000 UNITS TABS Take 5,000 Units by mouth at bedtime.    . clonazePAM (KLONOPIN) 1 MG tablet Take 1 tablet (1 mg total) by mouth at bedtime. 30 tablet 2  . diazepam (VALIUM) 5 MG tablet Take 2.5-5 mg by mouth every 6 (six) hours as needed for anxiety.    . diclofenac sodium (VOLTAREN) 1 % GEL Apply 2 g topically daily as needed (for pain).     [name] Kitchen divalproex (DEPAKOTE) 250 MG DR tablet Take 1 tablet (250 mg total) by mouth Nightly. (Patient taking differently: Take 250 mg by mouth every morning. ) 30 tablet 5  . feeding supplement, GLUCERNA SHAKE, (GLUCERNA SHAKE) LIQD Take 237 mLs by mouth 2 (two) times daily between meals.  0  . HYDROcodone-acetaminophen (NORCO/VICODIN) 5-325 MG per tablet Take 1-2 tablets by mouth every 4 (four) hours as needed for moderate pain. (Patient taking differently: Take 0.5-1 tablets by mouth every 6 (six) hours as needed for moderate pain. ) 80 tablet 0  . methocarbamol (ROBAXIN) 500 MG tablet Take 1 tablet (500 mg total) by mouth every 6 (six) hours as needed for muscle spasms. 80 tablet 0  . predniSONE (DELTASONE) 5 MG tablet Take 5 mg by mouth every other day. Taking Every Other morning for PMR Arthritis  1  . pregabalin (LYRICA) 25 MG capsule Take 25 mg by mouth at bedtime.    . traMADol (ULTRAM) 50 MG tablet Take 1-2 tablets (50-100 mg total) by mouth every 6 (six) hours as needed (mild pain). 60 tablet 1  . venlafaxine XR (EFFEXOR-XR) 150 MG 24 hr capsule Take 1 capsule (150 mg total) by mouth daily. 30 capsule 2  . venlafaxine XR (EFFEXOR-XR) 150 MG 24 hr capsule Take 150 mg  by mouth every morning.    . bacitracin  ointment Apply 1 application topically 2 (two) times daily. (Patient not taking: Reported on 11/22/2015) 120 g 0   No current facility-administered medications for this visit.    Allergies as of 11/22/2015 - Review Complete 11/22/2015  Allergen Reaction Noted  . Iodinated diagnostic agents Hives and Itching 05/17/2015  . Cymbalta [duloxetine hcl] Other (See Comments) 08/16/2014  . Iodine Hives 08/16/2014  . Latex Other (See Comments) 07/09/2015  . Lisinopril Other (See Comments) 08/16/2014  . Neurontin [gabapentin] Other (See Comments) 08/16/2014  . Tape Other (See Comments) 07/09/2015  . Zoloft [sertraline hcl] Other (See Comments) and Diarrhea 08/16/2014    Vitals: BP 130/76 mmHg  Pulse 74  Resp 16  Ht 6\' 2"  (1.88 m)  Wt 205 lb (92.987 kg)  BMI 26.31 kg/m2 Last Weight:  Wt Readings from Last 1 Encounters:  11/22/15 205 lb (92.987 kg)       Last Height:   Ht Readings from Last 1 Encounters:  11/22/15 6\' 2"  (1.88 m)    Physical exam:  General: The patient is awake, alert and appears not in acute distress. Head: Normocephalic, atraumatic.  Neck is supple. Mallampati 3   neck circumference: 18  Nasal airflow unrestricited , dysphonia, , TMJ is evident . Retrognathia is not seen.  Cardiovascular:  Regular rate and rhythm , with  murmurs , but not  carotid bruit, and without distended neck veins. Respiratory: Lungs are clear to auscultation. Skin:  Without evidence of edema, had bluish discoloured ankles and petechiea.  Trunk: BMI is  elevated and patient has normal posture.  Neurologic exam : The patient is awake and alert, oriented to place and time.   Memory subjective described as impaired .  There is a normal attention span & concentration ability. Speech is fluent without dysarthria, but there is dysphonia, not  aphasia. Mood and affect are appropriate.  Cranial nerves: Pupils are equal and briskly reactive to light.  Funduscopic exam without  evidence of pallor or edema. Status post cataract - Extraocular movements  in vertical and horizontal planes intact and without nystagmus. Visual fields by finger perimetry are intact. Hearing to finger rub intact.  Facial sensation intact to fine touch. Facial motor strength  Left nasolabial fold is weaker. tongue and uvula move midline.  Motor exam:   Normal tone, muscle bulk and symmetric strength in all extremities.  Sensory:  Fine touch, pinprick and vibration were tested in all extremities, and are very reduced in the feet. Vibration is lost. Proprioception is tested in the upper extremities - normal.  Coordination: Rapid alternating movements in the fingers/hands is normal. Finger-to-nose maneuver normal without evidence of ataxia, dysmetria.  There is a mild , low amplitude resting and action tremor.  Gait and station: Patient walks with cane as assistive device but  is able  (unassisted!)  to climb up to the exam table.  Strength within normal limits. Stance is stable and normal. Tandem gait is fragmented, had to be aborted . Romberg testing is positive, retropulsive.  Deep tendon reflexes: in the upper and lower extremities are absent- not elicited.   Babinski maneuver response is downgoing/ almost equivocal .   Assessment:  After physical and neurologic examination, review of laboratory studies, imaging, neurophysiology testing and pre-existing records, assessment is   Jared Bridges very likely has an vascular form of headache, a subtype of migraine, he has a visual aura this kind of headache has very much responded to depakote  a low-dose for preventative use. I will refill  this medication with pleasure.   He has orthostatic problems and he has dysautonomia. The dysautonomia as reflected in a sensory neuropathy and a tendency for retropulsion, falling backwards. I would like for the patient to resume physical therapy. I cannot do his improve his neuropathy. I  can treat a pain component but I cannot help him regain a normal sensation. The suspicion that he may have had TIAs I find unlikely.  He had bilateral less than 59% stenosis of his carotid artery bulb,  but this kind of degree is usually medically treated by antiplatelet therapy aspirin, Plavix or Aggrenox other medications used.  He should be treated for hypercholesterolemia if he has elevated lipids. His transcranial Doppler exam showed no evidence of intracranial stenosis. But there was a significant number of vessels that were not insonated. Basically just the vertebral arteries and basilar artery dilated. Moderate vertebrobasilar insufficiency would be an explanation for his retropulsion. None of these findings should deter him to complete physical therapy.  He is on an only 50 mg of Lyrica at night after failing Neurontin and higher doses. I think it may be worthwhile for him to go to Lyrica 100 mg, I also would not discontinue the Effexor which is at for treatment of depression or the pantoprazole for GERD.  He is on a low-dose of prednisone to maintain for polymyalgia rheumatica. Likely this is constant causing his petechiae.  Reports not to snore and he does not think he has apnea but he has several risk factors for it- and morning headaches are precipitated by hypercapnia, hypoxemia and apnea . His insomnia was last tested in a polysomnography at [facility] probably a decade ago. I do urge him to repeat a sleep study.     The patient was advised of the nature of the diagnosed sleep disorder , the treatment options and risks for general a health and wellness arising from not treating the condition. Visit duration was minutes.   Plan:  Treatment plan and additional workup :   Headache evaluation includes Sleep study with capnography. SPLIT with CO2, Please start oxygen if the patient does not qualify for CPAP/BiPAP but he is hypoxemic. melatonin 5 mg at night. Also had ordered a sleep study the  patient recalled having had one at [facility] several years ago not sure if he ever had a hypercapnia measurement or if prolonged hypoxemia was noted but these could be significant risk factors for migraine headaches.  For this reason I am not following up on a sleep study today- as it was not done.  He used depakote for migraine prophylaxis.  LFT were obtained in June before  starting this medication . He states that his migraine has reported been reduced significantly after starting 250 mg of Depakote. He will need refills.  Hearing improved on hearing aids.   RLS- versus neuropathy. voltaren cream helps. He remains extremely impaired in Propioception and has a positive romberg with retropulsion, and now a diagnosis of vertebrobasilar insufficiency.falls.  EXAM: MRI HEAD WITHOUT CONTRAST  TECHNIQUE: Multiplanar, multiecho pulse sequences of the brain and surrounding structures were obtained without intravenous contrast.  COMPARISON: CT head 08/16/2014.  FINDINGS: The patient was unable to remain motionless for the exam. Small or subtle lesions could be overlooked.  No evidence for acute infarction, hemorrhage, mass lesion, or extra-axial fluid. Advanced cerebral and cerebellar atrophy. Moderately extensive chronic microvascular ischemic change. Hydrocephalus ex vacuo.  Flow voids are maintained throughout the carotid, basilar, and vertebral arteries. There are no  areas of chronic hemorrhage. Pituitary, pineal, and cerebellar tonsils unremarkable. No upper cervical lesions.  Visualized calvarium, skull base, and upper cervical osseous structures unremarkable.  Scalp and extracranial soft tissues, orbits, sinuses, and mastoids show no acute process. RIGHT cataract extraction.  IMPRESSION: Atrophy and small vessel disease.  No acute intracranial findings.  Blood flow will be improved on antiplatelets, I will be happy to use Flourinef or Midrin would he have  a low BP. He doesn't.     Jared Partridge Liyanna Cartwright MD  11/22/2015   Cc:  Hurshel Party, MD

## 2015-12-11 ENCOUNTER — Ambulatory Visit (HOSPITAL_COMMUNITY): Payer: Medicare Other | Attending: Orthopedic Surgery | Admitting: Physical Therapy

## 2015-12-11 DIAGNOSIS — R29898 Other symptoms and signs involving the musculoskeletal system: Secondary | ICD-10-CM

## 2015-12-11 DIAGNOSIS — Z9181 History of falling: Secondary | ICD-10-CM

## 2015-12-11 DIAGNOSIS — R269 Unspecified abnormalities of gait and mobility: Secondary | ICD-10-CM

## 2015-12-11 DIAGNOSIS — G8929 Other chronic pain: Secondary | ICD-10-CM | POA: Diagnosis present

## 2015-12-11 DIAGNOSIS — R296 Repeated falls: Secondary | ICD-10-CM | POA: Insufficient documentation

## 2015-12-11 DIAGNOSIS — M25562 Pain in left knee: Secondary | ICD-10-CM | POA: Diagnosis present

## 2015-12-11 NOTE — Patient Instructions (Signed)
Balance: Unilateral    Attempt to balance on left leg, eyes open. Hold __5__ seconds. Repeat __10__ times per set. Do _1___ sets per session. Do __2__ sessions per day.   http://orth.exer.us/28   Copyright  VHI. All rights reserved.  Bridging    Slowly raise buttocks from floor, keeping stomach tight. Repeat _10-15___ times per set. Do ___1_ sets per session. Do __2__ sessions per day.  http://orth.exer.us/1096   Copyright  VHI. All rights reserved.  Self-Mobilization: Knee Flexion (Prone)    Bring left heel toward buttocks as close as possible. Hold __3__ seconds. Relax. Repeat _15___ times per set. Do _1___ sets per session. Do ___2_ sessions per day.  http://orth.exer.us/596   Copyright  VHI. All rights reserved.

## 2015-12-11 NOTE — Therapy (Signed)
[place], [place], [address] Phone: [phone]   Fax:  [phone]  Physical Therapy Evaluation  Patient Details  Name: MALAKII HARL MRN: [id number] Date of Birth: 1930/03/29 Referring Provider: Maureen Ralphs  Encounter Date: 12/11/2015      PT End of Session - 12/11/15 1449    Visit Number 1   Number of Visits 8   Date for PT Re-Evaluation 01/10/16   Authorization Type medicare   PT Start Time 1400   PT Stop Time 1438   PT Time Calculation (min) 38 min   Activity Tolerance Patient tolerated treatment well   Behavior During Therapy [facility] for tasks assessed/performed      Past Medical History  Diagnosis Date  . Chronic headaches   . Type 2 diabetes mellitus ([place])   . Essential hypertension   . Diverticulitis   . Syncope   . Hypercholesteremia   . GI bleed     Dr. Laural Golden - 1998  . Peptic ulcer disease   . Anemia, iron deficiency   . Depression   . Chronic kidney disease   . Peripheral neuropathy ([place])   . Polymyalgia rheumatica ([name]     Past Surgical History  Procedure Laterality Date  . Uvulectomy  2004  . Spinal fusion    . Spinal fusion    . Colon surgery      FOR DIVERTICULOSIS  . Tonsillectomy    . Eye surgery      right cataract with lens implant  . Appendectomy      as child  . Hernia repair      1-right inguinal, 3- left inguinal  . Total knee arthroplasty Left 05/28/2015    Procedure: LEFT TOTAL KNEE ARTHROPLASTY;  Surgeon: Gaynelle Arabian, MD;  Location: [facility];  Service: Orthopedics;  Laterality: Left;  . Irrigation and debridement knee Left 07/09/2015    Procedure: LEFT KNEE IRRIGATION AND DEBRIDEMENT WOUND CLOSURE;  Surgeon: Gaynelle Arabian, MD;  Location: [facility];  Service: Orthopedics;  Laterality: Left;    There were no vitals filed for this visit.  Visit Diagnosis:  Abnormal gait - Plan: PT plan of care cert/re-cert  Knee pain, chronic, left - Plan: PT plan of care  cert/re-cert  Left leg weakness - Plan: PT plan of care cert/re-cert  Falls infrequently - Plan: PT plan of care cert/re-cert      Subjective Assessment - 12/11/15 1429    Subjective Mr. Nicoloff states that he had arthroscopic surgery on his Lt knee in 2015 which failed.  He had a TKR on 05/28/2015 which got infected.  He states that his whole leg turned black;  he recieved IV antibiotic twice a day for 21 days.  At this time therapy was to painful.  He went for a Lt knee wound dehiscence on 07/09/15.  He tried therapy again in September.   He is now being referred to physical therapy to maximize his functional ability.      Patient is accompained by: Family member   Pertinent History Dm, peripheral neuropathy, hx of falls, migraines; fusion L45   How long can you sit comfortably? five minutes   How long can you stand comfortably? less than 10 minutes    How long can you walk comfortably? 15 minutes    Patient Stated Goals Pt wants to be able to be up for several hours at a time to repair things and paint his house.    Currently in Pain?  Yes   Pain Score 1   worst is a 6    Pain Location Knee   Pain Orientation Left   Pain Descriptors / Indicators Aching   Pain Type Chronic pain   Pain Onset More than a month ago   Pain Frequency Constant   Aggravating Factors  activity   Pain Relieving Factors rest/ice             [facility] PT Assessment - 12/11/15 0001    Assessment   Medical Diagnosis Lt TKR   Referring Provider Alusio   Onset Date/Surgical Date 05/27/15   Hand Dominance Right   Next MD Visit 12/13/2015   Prior Therapy HH   Precautions   Precautions None   Restrictions   Weight Bearing Restrictions No   Balance Screen   Has the patient fallen in the past 6 months Yes   How many times? Several    Has the patient had a decrease in activity level because of a fear of falling?  Yes   Is the patient reluctant to leave their home because of a fear of falling?  No   Home  [place] residence   Home Access Stairs to enter   Entrance Stairs-Number of Steps 4   [place] Two level   Alternate Level Stairs-Number of Steps 18   Prior Function   Level of Independence Independent   Vocation Retired   Nurse, children's, Education officer, community, walk   Cognition   Overall Cognitive Status Within Functional Limits for tasks assessed   Functional Tests   Functional tests Single leg stance;Sit to Stand   Single Leg Stance   Comments Lt: 4 seconds Rt: 1 seconds    Sit to Stand   Comments  5 sit to stand 9.91    ROM / Strength   AROM / PROM / Strength AROM;Strength   AROM   AROM Assessment Site Knee   Right/Left Knee Left   Left Knee Extension 0   Left Knee Flexion 125   Strength   Strength Assessment Site Hip;Knee;Ankle   Right/Left Hip Right;Left   Right Hip Flexion 4+/5   Right Hip Extension 4-/5   Right Hip ABduction 5/5   Right/Left Knee Right;Left   Right Knee Extension 4+/5   Left Knee Flexion 4-/5   Left Knee Extension 5/5   Right/Left Ankle Right;Left   Right Ankle Dorsiflexion 3+/5   Left Ankle Dorsiflexion 3+/5     foto 35               [facility] Adult PT Treatment/Exercise - 12/11/15 0001    Exercises   Exercises Knee/Hip   Knee/Hip Exercises: Standing   SLS x3   Knee/Hip Exercises: Supine   Bridges 10 reps                PT Education - 12/11/15 1449    Education provided Yes   Education Details HEP   Person(s) Educated Patient   Methods Explanation;Verbal cues   Comprehension Verbalized understanding;Returned demonstration          PT Short Term Goals - 12/11/15 1458    PT SHORT TERM GOAL #1   Title Pt to be I in HEP to achieve goals   Time 2   Period Weeks   Status New   PT SHORT TERM GOAL #2   Title Pt to have not fallen in the past week for improved safety   Time 2   Period  Weeks   Status New   PT SHORT TERM GOAL #3   Title Pt to be able to stand for 15 minutes to  make a small meal    Time 2   Period Weeks   PT SHORT TERM GOAL #4   Title Pt to be able to walk for 15 mintue for better health habits.            PT Long Term Goals - 2015/12/13 1500    PT LONG TERM GOAL #1   Title Pt to be I in advance HEP   Time 4   Period Weeks   Status New   PT LONG TERM GOAL #2   Title Pt strength of LE mm to be at least 4+/5 to be able to come from floor to standing without increased pain.    Time 4   Period Weeks   PT LONG TERM GOAL #3   Title Pt SLS on both LE to be at least 10 seconds to reduce risk of falls    Time 4   Period Weeks   PT LONG TERM GOAL #4   Title Pt to be able to tolerate standing/walking activities for an hour to be able to complete small repair tasks at home    Time 4   Period Weeks   PT LONG TERM GOAL #5   Title Pt pain to be no greater than a 1 throughout the day for better quality of life    Time 4   Period Weeks               Plan - 12/13/2015 1451    Clinical Impression Statement Mr. Stellhorn is an 82 yo male who underwent a Lt TKR in June of 2016.  The replacement became infected and he was unable to have any skilled therapy  due to the pain and the complications of his infection.  In September, he recieved approximately a month of home health therapy and has been working on his knee by himself ever since. At this time Mr. Cleghorn states that he can not tolerate prolong weight bearing on his knee and he is also concerned about his balance.   Mr. Rusek is  being referred to skilled PT to maximize his functional ability.  Evaluation demonstrates decreased strength, decreased activity tolerance, decreased balance and increased pain.  He will benefit from skilled PT to address the former deficits and maximize his functional ability.     Pt will benefit from skilled therapeutic intervention in order to improve on the following deficits Abnormal gait;Decreased activity tolerance;Decreased balance;Difficulty walking;Decreased  strength;Pain   Rehab Potential Good   PT Frequency 2x / week   PT Duration 4 weeks   PT Treatment/Interventions Patient/family education;Therapeutic activities;Gait training;Stair training;Functional mobility training;Therapeutic exercise;Balance training   PT Next Visit Plan Pt to begin rockerboard, standing knee flextion with weight, sit to stand activity, tandem stance, tandem gt.  Progress concentrating on balance and strengthening of the gluteal maximus and hamstring mm.    PT Home Exercise Plan given   Consulted and Agree with Plan of Care Patient          G-Codes - December 13, 2015 1504    Functional Assessment Tool Used foto    Functional Limitation Mobility: Walking and moving around   Mobility: Walking and Moving Around Current Status [id number]) At least 60 percent but less than 80 percent impaired, limited or restricted   Mobility: Walking and Moving Around Goal Status [id number]) At least  40 percent but less than 60 percent impaired, limited or restricted       Problem List Patient Active Problem List   Diagnosis Date Noted  . Postoperative wound dehiscence 07/09/2015  . Type 2 diabetes mellitus, uncontrolled ([place]) 06/29/2015  . Cellulitis of knee, left 06/29/2015  . Wound infection after surgery 06/29/2015  . OA (osteoarthritis) of knee 05/28/2015  . BRBPR (bright red blood per rectum) 11/21/2014  . Orthostatic hypotension 11/21/2014  . Dizziness 11/16/2014  . Polymyalgia rheumatica ([place]) 09/12/2014  . Anemia, iron deficiency 08/16/2014  . DJD (degenerative joint disease) 08/16/2014  . Essential hypertension   . Type 2 diabetes mellitus ([place])   . Cataract, nuclear 04/11/2014  . Complaining of back-related symptom 03/15/2014  . Arthralgia of hip or thigh 03/15/2014  . Gonalgia 10/10/2013  . 1St degree AV block 08/04/2013  . Infection of the upper respiratory tract 04/05/2013  . Cataract 02/24/2013  . Pseudoaphakia 02/24/2013  . Episode of syncope 01/11/2013  . Cardiac  murmur 11/09/2012  . Near syncope 11/09/2012  . Anxiety 06/18/2012  . Clinical depression 06/18/2012  . Bleeding gastrointestinal 06/18/2012  . Diverticulitis 06/18/2012  . Difficulty hearing 06/18/2012  . Adaptive colitis 06/18/2012  . Arthritis, degenerative 06/18/2012  . Disease of thyroid gland 06/18/2012  . Multinodular goiter 01/29/2012  . 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 Acute peptic ulcer 05/31/2011  . Adenoma of large intestine 05/31/2011  . Cephalalgia 05/31/2011  . Hypercholesterolemia 05/31/2011  . Benign fibroma of prostate 05/31/2011  . LBP (low back pain) 05/31/2011  . Diabetes mellitus ([place]) 05/31/2011  . Essential (primary) hypertension 05/31/2011   Rayetta Humphrey, PT CLT [phone] 12/11/2015, 3:11 PM  [facility] [address] [place], [place], [address] Phone: [phone]   Fax:  [phone]  Name: ABSALOM MACKLEY MRN: [id number] Date of Birth: 1930-08-28

## 2015-12-12 ENCOUNTER — Ambulatory Visit (HOSPITAL_COMMUNITY): Payer: Medicare Other | Admitting: Physical Therapy

## 2015-12-12 DIAGNOSIS — M25562 Pain in left knee: Secondary | ICD-10-CM

## 2015-12-12 DIAGNOSIS — G8929 Other chronic pain: Secondary | ICD-10-CM

## 2015-12-12 DIAGNOSIS — R269 Unspecified abnormalities of gait and mobility: Secondary | ICD-10-CM | POA: Diagnosis not present

## 2015-12-12 DIAGNOSIS — R29898 Other symptoms and signs involving the musculoskeletal system: Secondary | ICD-10-CM

## 2015-12-12 DIAGNOSIS — Z9181 History of falling: Secondary | ICD-10-CM

## 2015-12-12 NOTE — Therapy (Addendum)
[place], [place], [address] Phone: [phone]   Fax:  [phone]  Physical Therapy Treatment  Patient Details  Name: Jared Bridges MRN: [id number] Date of Birth: 1930-10-07 Referring Provider: Maureen Ralphs  Encounter Date: 12/12/2015      PT End of Session - 12/12/15 1343    PT Start Time 5053   PT Stop Time 1345   PT Time Calculation (min) 40 min   Equipment Utilized During Treatment Gait belt   Activity Tolerance Patient tolerated treatment well      Past Medical History  Diagnosis Date  . Chronic headaches   . Type 2 diabetes mellitus ([place])   . Essential hypertension   . Diverticulitis   . Syncope   . Hypercholesteremia   . GI bleed     Dr. Laural Golden - 1998  . Peptic ulcer disease   . Anemia, iron deficiency   . Depression   . Chronic kidney disease   . Peripheral neuropathy ([place])   . Polymyalgia rheumatica ([name]     Past Surgical History  Procedure Laterality Date  . Uvulectomy  2004  . Spinal fusion    . Spinal fusion    . Colon surgery      FOR DIVERTICULOSIS  . Tonsillectomy    . Eye surgery      right cataract with lens implant  . Appendectomy      as child  . Hernia repair      1-right inguinal, 3- left inguinal  . Total knee arthroplasty Left 05/28/2015    Procedure: LEFT TOTAL KNEE ARTHROPLASTY;  Surgeon: Gaynelle Arabian, MD;  Location: [facility];  Service: Orthopedics;  Laterality: Left;  . Irrigation and debridement knee Left 07/09/2015    Procedure: LEFT KNEE IRRIGATION AND DEBRIDEMENT WOUND CLOSURE;  Surgeon: Gaynelle Arabian, MD;  Location: [facility];  Service: Orthopedics;  Laterality: Left;    There were no vitals filed for this visit.  Visit Diagnosis:  Abnormal gait  Knee pain, chronic, left  Left leg weakness  Falls infrequently      Subjective Assessment - 12/12/15 1310    Subjective Pt states that he has done his exercises and he is a little sore.   Currently in Pain?  No/denies                         [facility] Adult PT Treatment/Exercise - 12/12/15 0001    Exercises   Exercises Knee/Hip   Knee/Hip Exercises: Standing   Heel Raises Both;10 reps   Knee Flexion Left;10 reps   Knee Flexion Limitations 4#   Rocker Board 2 minutes   SLS x5    Knee/Hip Exercises: Seated   Sit to Sand 20 reps  Rt back x 10; Lt back x 10              Balance Exercises - 12/12/15 1338    Balance Exercises: Standing   Standing Eyes Closed Narrow base of support (BOS);3 reps;Time;Other (comment)  60"   Tandem Stance Eyes open;2 reps;30 secs           PT Education - 12/12/15 1342    Education provided Yes   Education Details dorsiflexion with green t-band           PT Short Term Goals - 12/11/15 1458    PT SHORT TERM GOAL #1   Title Pt to be I in HEP to achieve goals   Time  2   Period Weeks   Status New   PT SHORT TERM GOAL #2   Title Pt to have not fallen in the past week for improved safety   Time 2   Period Weeks   Status New   PT SHORT TERM GOAL #3   Title Pt to be able to stand for 15 minutes to make a small meal    Time 2   Period Weeks   PT SHORT TERM GOAL #4   Title Pt to be able to walk for 15 mintue for better health habits.            PT Long Term Goals - Jan 07, 2016 1500    PT LONG TERM GOAL #1   Title Pt to be I in advance HEP   Time 4   Period Weeks   Status New   PT LONG TERM GOAL #2   Title Pt strength of LE mm to be at least 4+/5 to be able to come from floor to standing without increased pain.    Time 4   Period Weeks   PT LONG TERM GOAL #3   Title Pt SLS on both LE to be at least 10 seconds to reduce risk of falls    Time 4   Period Weeks   PT LONG TERM GOAL #4   Title Pt to be able to tolerate standing/walking activities for an hour to be able to complete small repair tasks at home    Time 4   Period Weeks   PT LONG TERM GOAL #5   Title Pt pain to be no greater than a 1 throughout the day for  better quality of life    Time 4   Period Weeks               Plan - 12/12/15 1345    Clinical Impression Statement Reviewed evaluation and goals with patient.  Added new exercises with contact guard assist for pt safety.     PT Next Visit Plan beging tandem gt and marching exercises.    Consulted and Agree with Plan of Care Patient          G-Codes - 01/07/16 1504    Functional Assessment Tool Used foto    Functional Limitation Mobility: Walking and moving around   Mobility: Walking and Moving Around Current Status [phone]) At least 60 percent but less than 80 percent impaired, limited or restricted   Mobility: Walking and Moving Around Goal Status [phone]) At least 40 percent but less than 60 percent impaired, limited or restricted      Problem List Patient Active Problem List   Diagnosis Date Noted  . Postoperative wound dehiscence 07/09/2015  . Type 2 diabetes mellitus, uncontrolled ([place]) 06/29/2015  . Cellulitis of knee, left 06/29/2015  . Wound infection after surgery 06/29/2015  . OA (osteoarthritis) of knee 05/28/2015  . BRBPR (bright red blood per rectum) 11/21/2014  . Orthostatic hypotension 11/21/2014  . Dizziness 11/16/2014  . Polymyalgia rheumatica ([place]) 09/12/2014  . Anemia, iron deficiency 08/16/2014  . DJD (degenerative joint disease) 08/16/2014  . Essential hypertension   . Type 2 diabetes mellitus ([place])   . Cataract, nuclear 04/11/2014  . Complaining of back-related symptom 03/15/2014  . Arthralgia of hip or thigh 03/15/2014  . Gonalgia 10/10/2013  . 1St degree AV block 08/04/2013  . Infection of the upper respiratory tract 04/05/2013  . Cataract 02/24/2013  . Pseudoaphakia 02/24/2013  . Episode of syncope 01/11/2013  .  Cardiac murmur 11/09/2012  . Near syncope 11/09/2012  . Anxiety 06/18/2012  . Clinical depression 06/18/2012  . Bleeding gastrointestinal 06/18/2012  . Diverticulitis 06/18/2012  . Difficulty hearing 06/18/2012  . Adaptive  colitis 06/18/2012  . Arthritis, degenerative 06/18/2012  . Disease of thyroid gland 06/18/2012  . Multinodular goiter 01/29/2012  . 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 Acute peptic ulcer 05/31/2011  . Adenoma of large intestine 05/31/2011  . Cephalalgia 05/31/2011  . Hypercholesterolemia 05/31/2011  . Benign fibroma of prostate 05/31/2011  . LBP (low back pain) 05/31/2011  . Diabetes mellitus ([place]) 05/31/2011  . Essential (primary) hypertension 05/31/2011    Rayetta Humphrey, PT CLT [phone] 12/12/2015, 1:47 PM  [facility] [address] [place], [place], [address] Phone: [phone]   Fax:  [phone]  Name: Jared Bridges MRN: [id number] Date of Birth: 29-Aug-1930   PHYSICAL THERAPY DISCHARGE SUMMARY  Visits from Start of Care: 2  Current functional level related to goals / functional outcomes:  no change 2 treatments only Remaining deficits: No change 2 treatments only   Education / Equipment: HEP  Plan: Patient agrees to discharge.  Patient goals were not met. Patient is being discharged due to a change in medical status.  ?????      Pt wife called stating pt has had a CVA and will not return to therapy.  Rayetta Humphrey, [place] CLT [phone]

## 2015-12-17 ENCOUNTER — Emergency Department (HOSPITAL_COMMUNITY): Payer: Medicare Other

## 2015-12-17 ENCOUNTER — Emergency Department (HOSPITAL_COMMUNITY)
Admission: EM | Admit: 2015-12-17 | Discharge: 2015-12-17 | Disposition: A | Discharge status: Home / Self Care | Payer: Medicare Other | Source: Home / Self Care | Attending: Emergency Medicine | Admitting: Emergency Medicine

## 2015-12-17 DIAGNOSIS — I129 Hypertensive chronic kidney disease with stage 1 through stage 4 chronic kidney disease, or unspecified chronic kidney disease: Secondary | ICD-10-CM | POA: Insufficient documentation

## 2015-12-17 DIAGNOSIS — N189 Chronic kidney disease, unspecified: Secondary | ICD-10-CM | POA: Insufficient documentation

## 2015-12-17 DIAGNOSIS — G43909 Migraine, unspecified, not intractable, without status migrainosus: Secondary | ICD-10-CM | POA: Insufficient documentation

## 2015-12-17 DIAGNOSIS — I639 Cerebral infarction, unspecified: Secondary | ICD-10-CM | POA: Diagnosis not present

## 2015-12-17 DIAGNOSIS — Z87891 Personal history of nicotine dependence: Secondary | ICD-10-CM

## 2015-12-17 DIAGNOSIS — Z8711 Personal history of peptic ulcer disease: Secondary | ICD-10-CM | POA: Insufficient documentation

## 2015-12-17 DIAGNOSIS — G629 Polyneuropathy, unspecified: Secondary | ICD-10-CM

## 2015-12-17 DIAGNOSIS — G8929 Other chronic pain: Secondary | ICD-10-CM | POA: Insufficient documentation

## 2015-12-17 DIAGNOSIS — Z91040 Latex allergy status: Secondary | ICD-10-CM | POA: Insufficient documentation

## 2015-12-17 DIAGNOSIS — F329 Major depressive disorder, single episode, unspecified: Secondary | ICD-10-CM

## 2015-12-17 DIAGNOSIS — Z79899 Other long term (current) drug therapy: Secondary | ICD-10-CM | POA: Insufficient documentation

## 2015-12-17 DIAGNOSIS — Z862 Personal history of diseases of the blood and blood-forming organs and certain disorders involving the immune mechanism: Secondary | ICD-10-CM

## 2015-12-17 DIAGNOSIS — E119 Type 2 diabetes mellitus without complications: Secondary | ICD-10-CM | POA: Insufficient documentation

## 2015-12-17 DIAGNOSIS — Z792 Long term (current) use of antibiotics: Secondary | ICD-10-CM | POA: Insufficient documentation

## 2015-12-17 DIAGNOSIS — R51 Headache: Secondary | ICD-10-CM | POA: Diagnosis not present

## 2015-12-17 LAB — CBG MONITORING, ED
GLUCOSE-CAPILLARY: 102 mg/dL — AB (ref 65–99)
GLUCOSE-CAPILLARY: 90 mg/dL (ref 65–99)

## 2015-12-17 LAB — DIFFERENTIAL
BASOS ABS: 0 10*3/uL (ref 0.0–0.1)
BASOS PCT: 0 %
EOS ABS: 0.8 10*3/uL — AB (ref 0.0–0.7)
Eosinophils Relative: 8 %
Lymphocytes Relative: 21 %
Lymphs Abs: 2.1 10*3/uL (ref 0.7–4.0)
MONOS PCT: 8 %
Monocytes Absolute: 0.8 10*3/uL (ref 0.1–1.0)
NEUTROS PCT: 62 %
Neutro Abs: 6.1 10*3/uL (ref 1.7–7.7)

## 2015-12-17 LAB — PROTIME-INR
INR: 1.09 (ref 0.00–1.49)
Prothrombin Time: 14.3 seconds (ref 11.6–15.2)

## 2015-12-17 LAB — COMPREHENSIVE METABOLIC PANEL
ALT: 33 U/L (ref 17–63)
ANION GAP: 8 (ref 5–15)
AST: 43 U/L — ABNORMAL HIGH (ref 15–41)
Albumin: 3.8 g/dL (ref 3.5–5.0)
Alkaline Phosphatase: 64 U/L (ref 38–126)
BUN: 23 mg/dL — ABNORMAL HIGH (ref 6–20)
CHLORIDE: 102 mmol/L (ref 101–111)
CO2: 28 mmol/L (ref 22–32)
Calcium: 9.1 mg/dL (ref 8.9–10.3)
Creatinine, Ser: 1.03 mg/dL (ref 0.61–1.24)
Glucose, Bld: 111 mg/dL — ABNORMAL HIGH (ref 65–99)
POTASSIUM: 4.1 mmol/L (ref 3.5–5.1)
Sodium: 138 mmol/L (ref 135–145)
Total Bilirubin: 0.8 mg/dL (ref 0.3–1.2)
Total Protein: 7 g/dL (ref 6.5–8.1)

## 2015-12-17 LAB — CBC
HEMATOCRIT: 42.1 % (ref 39.0–52.0)
Hemoglobin: 13.8 g/dL (ref 13.0–17.0)
MCH: 31.2 pg (ref 26.0–34.0)
MCHC: 32.8 g/dL (ref 30.0–36.0)
MCV: 95.2 fL (ref 78.0–100.0)
Platelets: 227 10*3/uL (ref 150–400)
RBC: 4.42 MIL/uL (ref 4.22–5.81)
RDW: 13.6 % (ref 11.5–15.5)
WBC: 9.8 10*3/uL (ref 4.0–10.5)

## 2015-12-17 LAB — I-STAT CHEM 8, ED
BUN: 23 mg/dL — ABNORMAL HIGH (ref 6–20)
Calcium, Ion: 1.17 mmol/L (ref 1.13–1.30)
Chloride: 100 mmol/L — ABNORMAL LOW (ref 101–111)
Creatinine, Ser: 1 mg/dL (ref 0.61–1.24)
Glucose, Bld: 103 mg/dL — ABNORMAL HIGH (ref 65–99)
HEMATOCRIT: 44 % (ref 39.0–52.0)
HEMOGLOBIN: 15 g/dL (ref 13.0–17.0)
Potassium: 3.9 mmol/L (ref 3.5–5.1)
SODIUM: 139 mmol/L (ref 135–145)
TCO2: 28 mmol/L (ref 0–100)

## 2015-12-17 LAB — I-STAT TROPONIN, ED: TROPONIN I, POC: 0 ng/mL (ref 0.00–0.08)

## 2015-12-17 LAB — APTT: APTT: 29 s (ref 24–37)

## 2015-12-17 LAB — VALPROIC ACID LEVEL

## 2015-12-17 MED ORDER — SODIUM CHLORIDE 0.9 % IV SOLN
1000.0000 mL | Freq: Once | INTRAVENOUS | Status: AC
  Administered 2015-12-17: 1000 mL via INTRAVENOUS

## 2015-12-17 MED ORDER — METOCLOPRAMIDE HCL 5 MG/ML IJ SOLN
10.0000 mg | Freq: Once | INTRAMUSCULAR | Status: AC
  Administered 2015-12-17: 10 mg via INTRAVENOUS
  Filled 2015-12-17: qty 2

## 2015-12-17 MED ORDER — SODIUM CHLORIDE 0.9 % IV SOLN
1000.0000 mL | INTRAVENOUS | Status: DC
  Administered 2015-12-17: 1000 mL via INTRAVENOUS

## 2015-12-17 MED ORDER — DIPHENHYDRAMINE HCL 50 MG/ML IJ SOLN
25.0000 mg | Freq: Once | INTRAMUSCULAR | Status: AC
  Administered 2015-12-17: 25 mg via INTRAVENOUS
  Filled 2015-12-17: qty 1

## 2015-12-17 NOTE — ED Notes (Signed)
Pt comes in with spouse. Per wife and her friend pt began having trouble speaking approx. 40 minutes ago (1640). Wife states this has progressively gotten worse. No deficits noted upon triage; pt has equal grip strengths. Pt is alert; not oriented to time or place.

## 2015-12-17 NOTE — ED Notes (Signed)
Patient given discharge instruction, verbalized understand. IV removed, band aid applied. Patient ambulatory out of the department.  

## 2015-12-17 NOTE — Progress Notes (Signed)
Beeper [id number] In CT 1739 Completed 1745 images sent to [facility] automatically [facility] Radiology [phone]

## 2015-12-17 NOTE — ED Notes (Signed)
Teleneurology monitor placed in the rm and tested.  Pt back to the room.

## 2015-12-17 NOTE — Discharge Instructions (Signed)
Return if you have any further difficulty with speech, or any new weakness or numbness.  Migraine Headache A migraine headache is an intense, throbbing pain on one or both sides of your head. A migraine can last for 30 minutes to several hours. CAUSES  The exact cause of a migraine headache is not always known. However, a migraine may be caused when nerves in the brain become irritated and release chemicals that cause inflammation. This causes pain. Certain things may also trigger migraines, such as:  Alcohol.  Smoking.  Stress.  Menstruation.  Aged cheeses.  Foods or drinks that contain nitrates, glutamate, aspartame, or tyramine.  Lack of sleep.  Chocolate.  Caffeine.  Hunger.  Physical exertion.  Fatigue.  Medicines used to treat chest pain (nitroglycerine), birth control pills, estrogen, and some blood pressure medicines. SIGNS AND SYMPTOMS  Pain on one or both sides of your head.  Pulsating or throbbing pain.  Severe pain that prevents daily activities.  Pain that is aggravated by any physical activity.  Nausea, vomiting, or both.  Dizziness.  Pain with exposure to bright lights, loud noises, or activity.  General sensitivity to bright lights, loud noises, or smells. Before you get a migraine, you may get warning signs that a migraine is coming (aura). An aura may include:  Seeing flashing lights.  Seeing bright spots, halos, or zigzag lines.  Having tunnel vision or blurred vision.  Having feelings of numbness or tingling.  Having trouble talking.  Having muscle weakness. DIAGNOSIS  A migraine headache is often diagnosed based on:  Symptoms.  Physical exam.  A CT scan or MRI of your head. These imaging tests cannot diagnose migraines, but they can help rule out other causes of headaches. TREATMENT Medicines may be given for pain and nausea. Medicines can also be given to help prevent recurrent migraines.  HOME CARE INSTRUCTIONS  Only  take over-the-counter or prescription medicines for pain or discomfort as directed by your health care provider. The use of long-term narcotics is not recommended.  Lie down in a dark, quiet room when you have a migraine.  Keep a journal to find out what may trigger your migraine headaches. For example, write down:  What you eat and drink.  How much sleep you get.  Any change to your diet or medicines.  Limit alcohol consumption.  Quit smoking if you smoke.  Get 7-9 hours of sleep, or as recommended by your health care provider.  Limit stress.  Keep lights dim if bright lights bother you and make your migraines worse. SEEK IMMEDIATE MEDICAL CARE IF:   Your migraine becomes severe.  You have a fever.  You have a stiff neck.  You have vision loss.  You have muscular weakness or loss of muscle control.  You start losing your balance or have trouble walking.  You feel faint or pass out.  You have severe symptoms that are different from your first symptoms. MAKE SURE YOU:   Understand these instructions.  Will watch your condition.  Will get help right away if you are not doing well or get worse.   This information is not intended to replace advice given to you by your health care provider. Make sure you discuss any questions you have with your health care provider.   Document Released: 11/17/2005 Document Revised: 12/08/2014 Document Reviewed: 07/25/2013 Elsevier Interactive Patient Education [employer].

## 2015-12-17 NOTE — ED Provider Notes (Signed)
CSN: [id number]     Arrival date & time 12/17/15  1712 History   First MD Initiated Contact with Patient 12/17/15 1730     Chief Complaint  Patient presents with  . Code Stroke     (Consider location/radiation/quality/duration/timing/severity/associated sxs/prior Treatment) The history is provided by the patient and the spouse.  82 year old male was brought to the emergency department because of feeling like he was going to pass out. His wife and she uses the terminology "go down". He has history of migraines and has had a left frontal headache all day. He started her noticing some speech difficulty sometime this afternoon and there is quite a bit of uncertainty about when this was first noticed. However, he was last known to be able to speak normally sometime this morning. He did not notice any arm or leg weakness. He denies nausea or vomiting or chest pain. He denies fever or chills.  Past Medical History  Diagnosis Date  . Chronic headaches   . Type 2 diabetes mellitus ([place])   . Essential hypertension   . Diverticulitis   . Syncope   . Hypercholesteremia   . GI bleed     Dr. Laural Golden - 1998  . Peptic ulcer disease   . Anemia, iron deficiency   . Depression   . Chronic kidney disease   . Peripheral neuropathy ([place])   . Polymyalgia rheumatica ([name]    Past Surgical History  Procedure Laterality Date  . Uvulectomy  2004  . Spinal fusion    . Spinal fusion    . Colon surgery      FOR DIVERTICULOSIS  . Tonsillectomy    . Eye surgery      right cataract with lens implant  . Appendectomy      as child  . Hernia repair      1-right inguinal, 3- left inguinal  . Total knee arthroplasty Left 05/28/2015    Procedure: LEFT TOTAL KNEE ARTHROPLASTY;  Surgeon: Gaynelle Arabian, MD;  Location: [facility];  Service: Orthopedics;  Laterality: Left;  . Irrigation and debridement knee Left 07/09/2015    Procedure: LEFT KNEE IRRIGATION AND DEBRIDEMENT WOUND CLOSURE;  Surgeon: Gaynelle Arabian, MD;   Location: [facility];  Service: Orthopedics;  Laterality: Left;   Family History  Problem Relation Age of Onset  . Hypertension    . Depression Brother   . Depression Daughter   . Colon cancer Sister    Social History  Substance Use Topics  . Smoking status: Former Smoker -- 0.10 packs/day    Types: Cigarettes    Start date: 12/18/1958    Quit date: 12/01/1986  . Smokeless tobacco: Never Used  . Alcohol Use: No    Review of Systems  All other systems reviewed and are negative.     Allergies  Iodinated diagnostic agents; Cymbalta; Iodine; Latex; Lisinopril; Neurontin; Tape; and Zoloft  Home Medications   Prior to Admission medications   Medication Sig Start Date End Date Taking? Authorizing Provider  bacitracin ointment Apply 1 application topically 2 (two) times daily. 10/08/15   Noemi Chapel, MD  Cholecalciferol (VITAMIN D3) 5000 UNITS TABS Take 5,000 Units by mouth at bedtime.    Historical Provider, MD  clonazePAM (KLONOPIN) 1 MG tablet Take 1 tablet (1 mg total) by mouth at bedtime. 11/22/15 11/21/16  Asencion Partridge Dohmeier, MD  diazepam (VALIUM) 5 MG tablet Take 2.5-5 mg by mouth every 6 (six) hours as needed for anxiety.    Historical Provider, MD  diclofenac sodium (  VOLTAREN) 1 % GEL Apply 2 g topically daily as needed (for pain). 11/22/15   Asencion Partridge Dohmeier, MD  divalproex (DEPAKOTE) 250 MG DR tablet Take 1 tablet (250 mg total) by mouth every morning. 11/22/15   Asencion Partridge Dohmeier, MD  feeding supplement, GLUCERNA SHAKE, (GLUCERNA SHAKE) LIQD Take 237 mLs by mouth 2 (two) times daily between meals. 08/18/14   Radene Gunning, NP  HYDROcodone-acetaminophen (NORCO/VICODIN) 5-325 MG per tablet Take 1-2 tablets by mouth every 4 (four) hours as needed for moderate pain. Patient taking differently: Take 0.5-1 tablets by mouth every 6 (six) hours as needed for moderate pain.  07/12/15   Arlee Muslim, PA-C  methocarbamol (ROBAXIN) 500 MG tablet Take 1 tablet (500 mg total) by mouth every 6  (six) hours as needed for muscle spasms. 07/12/15   Arlee Muslim, PA-C  predniSONE (DELTASONE) 5 MG tablet Take 5 mg by mouth every other day. Taking Every Other morning for PMR Arthritis 10/03/14   Historical Provider, MD  pregabalin (LYRICA) 25 MG capsule Take 25 mg by mouth at bedtime.    Historical Provider, MD  traMADol (ULTRAM) 50 MG tablet Take 1-2 tablets (50-100 mg total) by mouth every 6 (six) hours as needed (mild pain). 07/12/15   Arlee Muslim, PA-C  venlafaxine XR (EFFEXOR-XR) 150 MG 24 hr capsule Take 1 capsule (150 mg total) by mouth daily. 09/12/15 09/11/16  Cloria Spring, MD  venlafaxine XR (EFFEXOR-XR) 150 MG 24 hr capsule Take 150 mg by mouth every morning.    Historical Provider, MD   BP 151/72 mmHg  Pulse 74  Temp(Src) 98 F (36.7 C) (Oral)  Resp 16  Ht 6\' 2"  (1.88 m)  Wt 205 lb (92.987 kg)  BMI 26.31 kg/m2  SpO2 99% Physical Exam  Vitals reviewed.  82 year old male, resting comfortably and in no acute distress. Vital signs are significant for hypertension. Oxygen saturation is 99%, which is normal. Head is normocephalic and atraumatic. PERRLA, EOMI. Oropharynx is clear. Neck is nontender and supple without adenopathy or JVD.there are no carotid bruits. Back is nontender and there is no CVA tenderness. Lungs are clear without rales, wheezes, or rhonchi. Chest is nontender. Heart has regular rate and rhythm with 2/6 systolic ejection murmur heard at the cardiac base. Abdomen is soft, flat, nontender without masses or hepatosplenomegaly and peristalsis is normoactive. Extremities have no cyanosis or edema, full range of motion is present. Skin is warm and dry without rash. Neurologic: he is very hard of hearing and did not bring his hearing aid with them. He is awake and alert and oriented. Speech is slightly slow but fluent and appropriate without dysarthria. Cranial nerves are intact, there are no motor or sensory deficits.there is no pronator drift. No abnormal  reflexes are seen.  ED Course  Procedures (including critical care time) Labs Review Results for orders placed or performed during the hospital encounter of 12/17/15  Protime-INR  Result Value Ref Range   Prothrombin Time 14.3 11.6 - 15.2 seconds   INR 1.09 0.00 - 1.49  APTT  Result Value Ref Range   aPTT 29 24 - 37 seconds  CBC  Result Value Ref Range   WBC 9.8 4.0 - 10.5 K/uL   RBC 4.42 4.22 - 5.81 MIL/uL   Hemoglobin 13.8 13.0 - 17.0 g/dL   HCT 42.1 39.0 - 52.0 %   MCV 95.2 78.0 - 100.0 fL   MCH 31.2 26.0 - 34.0 pg   MCHC 32.8 30.0 - 36.0 g/dL  RDW 13.6 11.5 - 15.5 %   Platelets 227 150 - 400 K/uL  Differential  Result Value Ref Range   Neutrophils Relative % 62 %   Neutro Abs 6.1 1.7 - 7.7 K/uL   Lymphocytes Relative 21 %   Lymphs Abs 2.1 0.7 - 4.0 K/uL   Monocytes Relative 8 %   Monocytes Absolute 0.8 0.1 - 1.0 K/uL   Eosinophils Relative 8 %   Eosinophils Absolute 0.8 (H) 0.0 - 0.7 K/uL   Basophils Relative 0 %   Basophils Absolute 0.0 0.0 - 0.1 K/uL  Comprehensive metabolic panel  Result Value Ref Range   Sodium 138 135 - 145 mmol/L   Potassium 4.1 3.5 - 5.1 mmol/L   Chloride 102 101 - 111 mmol/L   CO2 28 22 - 32 mmol/L   Glucose, Bld 111 (H) 65 - 99 mg/dL   BUN 23 (H) 6 - 20 mg/dL   Creatinine, Ser 1.03 0.61 - 1.24 mg/dL   Calcium 9.1 8.9 - 10.3 mg/dL   Total Protein 7.0 6.5 - 8.1 g/dL   Albumin 3.8 3.5 - 5.0 g/dL   AST 43 (H) 15 - 41 U/L   ALT 33 17 - 63 U/L   Alkaline Phosphatase 64 38 - 126 U/L   Total Bilirubin 0.8 0.3 - 1.2 mg/dL   GFR calc non Af Amer >60 >60 mL/min   GFR calc Af Amer >60 >60 mL/min   Anion gap 8 5 - 15  Valproic acid level  Result Value Ref Range   Valproic Acid Lvl <10 (L) 50.0 - 100.0 ug/mL  CBG monitoring, ED  Result Value Ref Range   Glucose-Capillary 102 (H) 65 - 99 mg/dL  I-stat troponin, ED (not at [facility], [facility])  Result Value Ref Range   Troponin i, poc 0.00 0.00 - 0.08 ng/mL   Comment 3          CBG monitoring, ED   Result Value Ref Range   Glucose-Capillary 90 65 - 99 mg/dL  I-Stat Chem 8, ED  (not at [facility], [facility])  Result Value Ref Range   Sodium 139 135 - 145 mmol/L   Potassium 3.9 3.5 - 5.1 mmol/L   Chloride 100 (L) 101 - 111 mmol/L   BUN 23 (H) 6 - 20 mg/dL   Creatinine, Ser 1.00 0.61 - 1.24 mg/dL   Glucose, Bld 103 (H) 65 - 99 mg/dL   Calcium, Ion 1.17 1.13 - 1.30 mmol/L   TCO2 28 0 - 100 mmol/L   Hemoglobin 15.0 13.0 - 17.0 g/dL   HCT 44.0 39.0 - 52.0 %   Imaging Review Ct Head Wo Contrast  12/17/2015  CLINICAL DATA:  Dysarthria began 40 minutes ago getting worse EXAM: CT HEAD WITHOUT CONTRAST TECHNIQUE: Contiguous axial images were obtained from the base of the skull through the vertex without intravenous contrast. COMPARISON:  Multiple prior studies FINDINGS: Age-related moderate atrophy. Mild low attenuation in the periventricular white matter bilaterally. No focal attenuation change to suggest mass or vascular territory infarct. No hemorrhage or extra-axial fluid. No hydrocephalus. Calvarium intact. IMPRESSION: Age-related involutional change with no acute findings. Critical Value/emergent results were called by telephone at the time of interpretation on 12/17/2015 at 5:52 pm to Dr. Delora Fuel , who verbally acknowledged these results. Electronically Signed   By: Skipper Cliche M.D.   On: 12/17/2015 17:52   I have personally reviewed and evaluated these images and lab results as part of my medical decision-making.   EKG Interpretation  Date/Time:  Monday December 17 2015 17:28:16 EST Ventricular Rate:  74 PR Interval:  210 QRS Duration: 98 QT Interval:  412 QTC Calculation: 457 R Axis:   -46 Text Interpretation:  Sinus rhythm Abnormal R-wave progression, early  transition Inferior infarct, old When compared with ECG of 10/08/2015, No  significant change was found Confirmed by [facility]  MD, Natalina Wieting ([id number]) on  12/17/2015 5:35:51 PM      CRITICAL CARE Performed by: [id number] Total  critical care time: 40 minutes Critical care time was exclusive of separately billable procedures and treating other patients. Critical care was necessary to treat or prevent imminent or life-threatening deterioration. Critical care was time spent personally by me on the following activities: development of treatment plan with patient and/or surrogate as well as nursing, discussions with consultants, evaluation of patient's response to treatment, examination of patient, obtaining history from patient or surrogate, ordering and performing treatments and interventions, ordering and review of laboratory studies, ordering and review of radiographic studies, pulse oximetry and re-evaluation of patient's condition.  MDM   Final diagnoses:  Migraine without status migrainosus, not intractable, unspecified migraine type    Speech difficulty which I doubt is actually a stroke. Additionally, time of last known normal is very uncertain and would place an outside the window of any therapeutic intervention. Head CT is been obtained and is normal and [place] neurology consult has been obtained confirming suspicion that he has not had a stroke. This may be related to his headache and he has been given a headache cocktail. Neurology consult also recommends valproic acid level since he is taking valproic acid.  Neurology consult appreciated. No evidence of stroke. Following headache cocktail, headache Resolved completely and his speech had completely returned to normal. Please note that neurology consultation suggested inpatient evaluation of with MRI. At this point, I do not feel inpatient evaluation is needed since this does not appear to have been a TIA, and he is referred back to his PCP.  Delora Fuel, MD [id number] [phone]

## 2015-12-18 ENCOUNTER — Ambulatory Visit (HOSPITAL_COMMUNITY): Payer: Medicare Other

## 2015-12-19 ENCOUNTER — Inpatient Hospital Stay (HOSPITAL_COMMUNITY)
Admission: EM | Admit: 2015-12-19 | Discharge: 2015-12-20 | DRG: 066 | Disposition: A | Discharge status: Home / Self Care | Payer: Medicare Other | Attending: Internal Medicine | Admitting: Internal Medicine

## 2015-12-19 ENCOUNTER — Emergency Department (HOSPITAL_COMMUNITY): Payer: Medicare Other

## 2015-12-19 ENCOUNTER — Telehealth (HOSPITAL_COMMUNITY): Payer: Self-pay

## 2015-12-19 ENCOUNTER — Encounter (HOSPITAL_COMMUNITY): Payer: Self-pay | Admitting: Emergency Medicine

## 2015-12-19 DIAGNOSIS — E785 Hyperlipidemia, unspecified: Secondary | ICD-10-CM | POA: Diagnosis present

## 2015-12-19 DIAGNOSIS — E119 Type 2 diabetes mellitus without complications: Secondary | ICD-10-CM

## 2015-12-19 DIAGNOSIS — E079 Disorder of thyroid, unspecified: Secondary | ICD-10-CM | POA: Diagnosis present

## 2015-12-19 DIAGNOSIS — N189 Chronic kidney disease, unspecified: Secondary | ICD-10-CM | POA: Diagnosis present

## 2015-12-19 DIAGNOSIS — Z79891 Long term (current) use of opiate analgesic: Secondary | ICD-10-CM

## 2015-12-19 DIAGNOSIS — I6523 Occlusion and stenosis of bilateral carotid arteries: Secondary | ICD-10-CM | POA: Diagnosis not present

## 2015-12-19 DIAGNOSIS — E7800 Pure hypercholesterolemia, unspecified: Secondary | ICD-10-CM | POA: Diagnosis present

## 2015-12-19 DIAGNOSIS — E1142 Type 2 diabetes mellitus with diabetic polyneuropathy: Secondary | ICD-10-CM | POA: Diagnosis present

## 2015-12-19 DIAGNOSIS — R51 Headache: Secondary | ICD-10-CM

## 2015-12-19 DIAGNOSIS — I10 Essential (primary) hypertension: Secondary | ICD-10-CM | POA: Diagnosis present

## 2015-12-19 DIAGNOSIS — E1122 Type 2 diabetes mellitus with diabetic chronic kidney disease: Secondary | ICD-10-CM | POA: Diagnosis present

## 2015-12-19 DIAGNOSIS — M353 Polymyalgia rheumatica: Secondary | ICD-10-CM | POA: Diagnosis present

## 2015-12-19 DIAGNOSIS — Z96652 Presence of left artificial knee joint: Secondary | ICD-10-CM | POA: Diagnosis present

## 2015-12-19 DIAGNOSIS — Z87891 Personal history of nicotine dependence: Secondary | ICD-10-CM

## 2015-12-19 DIAGNOSIS — Z8249 Family history of ischemic heart disease and other diseases of the circulatory system: Secondary | ICD-10-CM

## 2015-12-19 DIAGNOSIS — I440 Atrioventricular block, first degree: Secondary | ICD-10-CM | POA: Diagnosis not present

## 2015-12-19 DIAGNOSIS — I6529 Occlusion and stenosis of unspecified carotid artery: Secondary | ICD-10-CM | POA: Diagnosis present

## 2015-12-19 DIAGNOSIS — R519 Headache, unspecified: Secondary | ICD-10-CM

## 2015-12-19 DIAGNOSIS — I639 Cerebral infarction, unspecified: Secondary | ICD-10-CM

## 2015-12-19 DIAGNOSIS — I129 Hypertensive chronic kidney disease with stage 1 through stage 4 chronic kidney disease, or unspecified chronic kidney disease: Secondary | ICD-10-CM | POA: Diagnosis present

## 2015-12-19 DIAGNOSIS — Z818 Family history of other mental and behavioral disorders: Secondary | ICD-10-CM

## 2015-12-19 DIAGNOSIS — Z800 Family history of malignant neoplasm of digestive organs: Secondary | ICD-10-CM

## 2015-12-19 DIAGNOSIS — R011 Cardiac murmur, unspecified: Secondary | ICD-10-CM | POA: Diagnosis present

## 2015-12-19 DIAGNOSIS — Z981 Arthrodesis status: Secondary | ICD-10-CM

## 2015-12-19 DIAGNOSIS — H9190 Unspecified hearing loss, unspecified ear: Secondary | ICD-10-CM

## 2015-12-19 DIAGNOSIS — Z7952 Long term (current) use of systemic steroids: Secondary | ICD-10-CM

## 2015-12-19 DIAGNOSIS — G8929 Other chronic pain: Secondary | ICD-10-CM | POA: Diagnosis present

## 2015-12-19 LAB — COMPREHENSIVE METABOLIC PANEL
ALT: 34 U/L (ref 17–63)
AST: 29 U/L (ref 15–41)
Albumin: 3.8 g/dL (ref 3.5–5.0)
Alkaline Phosphatase: 58 U/L (ref 38–126)
Anion gap: 8 (ref 5–15)
BUN: 21 mg/dL — AB (ref 6–20)
CHLORIDE: 103 mmol/L (ref 101–111)
CO2: 28 mmol/L (ref 22–32)
CREATININE: 1.11 mg/dL (ref 0.61–1.24)
Calcium: 9.1 mg/dL (ref 8.9–10.3)
GFR calc non Af Amer: 59 mL/min — ABNORMAL LOW (ref >60)
Glucose, Bld: 147 mg/dL — ABNORMAL HIGH (ref 65–99)
Potassium: 4 mmol/L (ref 3.5–5.1)
SODIUM: 139 mmol/L (ref 135–145)
Total Bilirubin: 0.8 mg/dL (ref 0.3–1.2)
Total Protein: 6.8 g/dL (ref 6.5–8.1)

## 2015-12-19 LAB — URINALYSIS, ROUTINE W REFLEX MICROSCOPIC
BILIRUBIN URINE: NEGATIVE
GLUCOSE, UA: NEGATIVE mg/dL
HGB URINE DIPSTICK: NEGATIVE
LEUKOCYTES UA: NEGATIVE
Nitrite: NEGATIVE
PH: 7 (ref 5.0–8.0)
PROTEIN: NEGATIVE mg/dL
Specific Gravity, Urine: 1.015 (ref 1.005–1.030)

## 2015-12-19 LAB — DIFFERENTIAL
BASOS ABS: 0 10*3/uL (ref 0.0–0.1)
BASOS PCT: 0 %
Eosinophils Absolute: 0.4 10*3/uL (ref 0.0–0.7)
Eosinophils Relative: 5 %
Lymphocytes Relative: 13 %
Lymphs Abs: 1.1 10*3/uL (ref 0.7–4.0)
Monocytes Absolute: 0.7 10*3/uL (ref 0.1–1.0)
Monocytes Relative: 8 %
NEUTROS ABS: 6.6 10*3/uL (ref 1.7–7.7)
Neutrophils Relative %: 74 %

## 2015-12-19 LAB — CBC
HEMATOCRIT: 40.6 % (ref 39.0–52.0)
HEMOGLOBIN: 13.2 g/dL (ref 13.0–17.0)
MCH: 31.4 pg (ref 26.0–34.0)
MCHC: 32.5 g/dL (ref 30.0–36.0)
MCV: 96.4 fL (ref 78.0–100.0)
PLATELETS: 227 10*3/uL (ref 150–400)
RBC: 4.21 MIL/uL — ABNORMAL LOW (ref 4.22–5.81)
RDW: 13.7 % (ref 11.5–15.5)
WBC: 8.8 10*3/uL (ref 4.0–10.5)

## 2015-12-19 LAB — I-STAT CHEM 8, ED
BUN: 20 mg/dL (ref 6–20)
CALCIUM ION: 1.2 mmol/L (ref 1.13–1.30)
CHLORIDE: 101 mmol/L (ref 101–111)
CREATININE: 1 mg/dL (ref 0.61–1.24)
GLUCOSE: 142 mg/dL — AB (ref 65–99)
HCT: 43 % (ref 39.0–52.0)
HEMOGLOBIN: 14.6 g/dL (ref 13.0–17.0)
POTASSIUM: 4 mmol/L (ref 3.5–5.1)
Sodium: 140 mmol/L (ref 135–145)
TCO2: 26 mmol/L (ref 0–100)

## 2015-12-19 LAB — I-STAT TROPONIN, ED: Troponin i, poc: 0 ng/mL (ref 0.00–0.08)

## 2015-12-19 LAB — RAPID URINE DRUG SCREEN, HOSP PERFORMED
AMPHETAMINES: NOT DETECTED
BARBITURATES: NOT DETECTED
BENZODIAZEPINES: POSITIVE — AB
COCAINE: NOT DETECTED
Opiates: POSITIVE — AB
TETRAHYDROCANNABINOL: NOT DETECTED

## 2015-12-19 LAB — C-REACTIVE PROTEIN: CRP: 0.8 mg/dL (ref <1.0)

## 2015-12-19 LAB — SEDIMENTATION RATE: Sed Rate: 15 mm/hr (ref 0–16)

## 2015-12-19 LAB — CBG MONITORING, ED: GLUCOSE-CAPILLARY: 133 mg/dL — AB (ref 65–99)

## 2015-12-19 LAB — PROTIME-INR
INR: 1.16 (ref 0.00–1.49)
Prothrombin Time: 15 seconds (ref 11.6–15.2)

## 2015-12-19 LAB — ETHANOL

## 2015-12-19 LAB — APTT: APTT: 30 s (ref 24–37)

## 2015-12-19 MED ORDER — ASPIRIN 300 MG RE SUPP: 300.0000 mg | Freq: Every day | RECTAL | Status: DC

## 2015-12-19 MED ORDER — PREDNISONE 10 MG PO TABS: 5.0000 mg | ORAL_TABLET | ORAL | Status: DC

## 2015-12-19 MED ORDER — CLONAZEPAM 0.5 MG PO TABS
1.0000 mg | ORAL_TABLET | Freq: Every day | ORAL | Status: DC
  Administered 2015-12-19: 1 mg via ORAL
  Filled 2015-12-19: qty 2

## 2015-12-19 MED ORDER — HYDROCODONE-ACETAMINOPHEN 5-325 MG PO TABS
0.5000 | ORAL_TABLET | Freq: Four times a day (QID) | ORAL | Status: DC | PRN
  Administered 2015-12-20: 0.5 via ORAL
  Filled 2015-12-19: qty 1

## 2015-12-19 MED ORDER — ENOXAPARIN SODIUM 40 MG/0.4ML ~~LOC~~ SOLN
40.0000 mg | SUBCUTANEOUS | Status: DC
Start: 2015-12-19 — End: 2015-12-20
  Administered 2015-12-19: 40 mg via SUBCUTANEOUS
  Filled 2015-12-19: qty 0.4

## 2015-12-19 MED ORDER — PREGABALIN 25 MG PO CAPS
25.0000 mg | ORAL_CAPSULE | Freq: Every day | ORAL | Status: DC
  Administered 2015-12-19: 25 mg via ORAL
  Filled 2015-12-19: qty 1

## 2015-12-19 MED ORDER — ASPIRIN 325 MG PO TABS: 325.0000 mg | ORAL_TABLET | Freq: Once | ORAL | Status: DC

## 2015-12-19 MED ORDER — ASPIRIN 325 MG PO TABS
325.0000 mg | ORAL_TABLET | Freq: Every day | ORAL | Status: DC
  Administered 2015-12-19 – 2015-12-20 (×2): 325 mg via ORAL
  Filled 2015-12-19 (×2): qty 1

## 2015-12-19 MED ORDER — ATORVASTATIN CALCIUM 40 MG PO TABS
40.0000 mg | ORAL_TABLET | Freq: Every day | ORAL | Status: DC
  Administered 2015-12-19: 40 mg via ORAL
  Filled 2015-12-19: qty 1

## 2015-12-19 MED ORDER — VENLAFAXINE HCL ER 75 MG PO CP24
150.0000 mg | ORAL_CAPSULE | Freq: Every day | ORAL | Status: DC
  Administered 2015-12-19 – 2015-12-20 (×2): 150 mg via ORAL
  Filled 2015-12-19 (×2): qty 2

## 2015-12-19 MED ORDER — SENNOSIDES-DOCUSATE SODIUM 8.6-50 MG PO TABS: 1.0000 | ORAL_TABLET | Freq: Every evening | ORAL | Status: DC | PRN

## 2015-12-19 MED ORDER — STROKE: EARLY STAGES OF RECOVERY BOOK
Freq: Once | Status: AC
  Administered 2015-12-20: 11:00:00
  Filled 2015-12-19: qty 1

## 2015-12-19 MED ORDER — DIVALPROEX SODIUM 250 MG PO DR TAB
250.0000 mg | DELAYED_RELEASE_TABLET | Freq: Every morning | ORAL | Status: DC
  Administered 2015-12-20: 250 mg via ORAL
  Filled 2015-12-19: qty 1

## 2015-12-19 NOTE — Consult Note (Signed)
Jared A. Merlene Laughter, MD     [email or web address]          Jared Bridges is an 82 y.o. male.   ASSESSMENT/PLAN: Left hemispheric infarct. This appears to be embolic either artery to artery embolism or cardioembolic. Risk factors age, diabetes and hypertension. Acute on chronic headaches.   RECOMMENDATION: Also follow telemetry. A 30 day event monitor is suggested to evaluate for possible atrial fibrillation. Agree with aspirin and the initiation of statin medication. I will discontinue NSAIDs which she is taking apparently for chronic pain. These medications are not increased thromboembolic events. Symptomatically for headaches. Follow carotid duplex Doppler and echocardiography.   The patient is a 82 year old white male who has had a history of episodic headaches on last several weeks. The patient has seen [facility] neurology for these problems. He also has had some issues with gait ataxia. The patient however had severe headaches a few days ago which prompted the patient to come to the emergency room. He was treated and released but decided to return because of the severe recalcitrant nature of these headaches. Headaches were associated with some gait instability and a mild dysarthria. The patient denies focal numbness, weakness, dysphagia or dizziness. He does not report any associated chest pain or shortness of breath. No GI or GU symptoms are reported. The review of systems is otherwise negative.  GENERAL: Pleasant man who is in no acute distress. He is sleeping in the exam room. He tells me that he was up all night long with his headaches in the emergency room.  HEENT: Supple. Atraumatic normocephalic.   ABDOMEN: soft  EXTREMITIES: No edema   BACK: Normal.  SKIN: Normal by inspection.    MENTAL STATUS: Alert and oriented. The patient states his age and the month appropriately. Speech and cognition are generally intact. There appears to be subtle evidence  of language impairment especially with the comprehension. Naming is relatively intact as so is expression of speech. Judgment and insight normal.   CRANIAL NERVES: Pupils are equal, round and reactive to light and accommodation; extra ocular movements are full, there is no significant nystagmus; visual fields are full; upper and lower facial muscles are normal in strength and symmetric, there is no flattening of the nasolabial folds; tongue is midline; uvula is midline; shoulder elevation is normal.  MOTOR: Normal tone, bulk and strength; no pronator drift. No drift of the upper extremities or lower extremities.  COORDINATION: Left finger to nose is normal, right finger to nose is normal, No rest tremor; no intention tremor; no postural tremor; no bradykinesia.  REFLEXES: Deep tendon reflexes are symmetrical and normal. Babinski reflexes are flexor bilaterally.   SENSATION: Normal to light touch. No extinction to double simultaneous stimulation.    NIH stroke scale 1.   Blood pressure 161/71, pulse 78, temperature 98.9 F (37.2 C), temperature source Oral, resp. rate 18, height 6' 2" (1.88 m), weight 92.08 kg (203 lb), SpO2 95 %.  Past Medical History  Diagnosis Date  . Chronic headaches   . Type 2 diabetes mellitus ([place])   . Essential hypertension   . Diverticulitis   . Syncope   . Hypercholesteremia   . GI bleed     Dr. Laural Golden - 1998  . Peptic ulcer disease   . Anemia, iron deficiency   . Depression   . Chronic kidney disease   . Peripheral neuropathy ([place])   . Polymyalgia rheumatica [facility])     Past Surgical History  Procedure Laterality  Date  . Uvulectomy  2004  . Spinal fusion    . Spinal fusion    . Colon surgery      FOR DIVERTICULOSIS  . Tonsillectomy    . Eye surgery      right cataract with lens implant  . Appendectomy      as child  . Hernia repair      1-right inguinal, 3- left inguinal  . Total knee arthroplasty Left 05/28/2015    Procedure: LEFT TOTAL  KNEE ARTHROPLASTY;  Surgeon: Gaynelle Arabian, MD;  Location: [facility];  Service: Orthopedics;  Laterality: Left;  . Irrigation and debridement knee Left 07/09/2015    Procedure: LEFT KNEE IRRIGATION AND DEBRIDEMENT WOUND CLOSURE;  Surgeon: Gaynelle Arabian, MD;  Location: [facility]S;  Service: Orthopedics;  Laterality: Left;    Family History  Problem Relation Age of Onset  . Hypertension    . Depression Brother   . Depression Daughter   . Colon cancer Sister     Social History:  reports that he quit smoking about 29 years ago. His smoking use included Cigarettes. He started smoking about 57 years ago. He smoked 0.10 packs per day. He has never used smokeless tobacco. He reports that he does not drink alcohol or use illicit drugs.  Allergies:  Allergies  Allergen Reactions  . Iodinated Diagnostic Agents Hives and Itching    Other reaction(s): Other (See Comments) Other Reaction: iv contrast=hives  . Cymbalta [Duloxetine Hcl] Other (See Comments)    Makes patient have jerking motions.  . Iodine Hives    very allergic(per daughter)  . Latex Other (See Comments)    Redness iritation  . Lisinopril Other (See Comments)    Makes patient have jerking motions.  . Neurontin [Gabapentin] Other (See Comments)    Makes patient have jerking motions.  . Tape Other (See Comments)    Skin irritation  . Zoloft [Sertraline Hcl] Other (See Comments) and Diarrhea    Makes patient have jerking motions.    Medications: Prior to Admission medications   Medication Sig Start Date End Date Taking? Authorizing Provider  bacitracin ointment Apply 1 application topically 2 (two) times daily. 10/08/15  Yes Noemi Chapel, MD  Cholecalciferol (VITAMIN D3) 5000 UNITS TABS Take 5,000 Units by mouth at bedtime.   Yes Historical Provider, MD  clonazePAM (KLONOPIN) 1 MG tablet Take 1 tablet (1 mg total) by mouth at bedtime. 11/22/15 11/21/16 Yes Carmen Dohmeier, MD  diclofenac sodium (VOLTAREN) 1 % GEL Apply 2 g topically  daily as needed (for pain). 11/22/15  Yes Asencion Partridge Dohmeier, MD  divalproex (DEPAKOTE) 250 MG DR tablet Take 1 tablet (250 mg total) by mouth every morning. 11/22/15  Yes Carmen Dohmeier, MD  feeding supplement, GLUCERNA SHAKE, (GLUCERNA SHAKE) LIQD Take 237 mLs by mouth 2 (two) times daily between meals. Patient taking differently: Take 237 mLs by mouth daily as needed.  08/18/14  Yes Radene Gunning, NP  HYDROcodone-acetaminophen (NORCO/VICODIN) 5-325 MG per tablet Take 1-2 tablets by mouth every 4 (four) hours as needed for moderate pain. Patient taking differently: Take 0.5-1 tablets by mouth every 6 (six) hours as needed for moderate pain. Uses as needed for migraine pain 07/12/15  Yes Arlee Muslim, PA-C  predniSONE (DELTASONE) 5 MG tablet Take 5 mg by mouth every other day. Taking Every Other morning for PMR Arthritis 10/03/14  Yes Historical Provider, MD  pregabalin (LYRICA) 25 MG capsule Take 25 mg by mouth at bedtime.   Yes Historical Provider, MD  venlafaxine XR (EFFEXOR-XR) 150 MG 24 hr capsule Take 1 capsule (150 mg total) by mouth daily. 09/12/15 09/11/16 Yes Cloria Spring, MD    Scheduled Meds: .  stroke: mapping our early stages of recovery book   Does not apply Once  . aspirin  300 mg Rectal Daily   Or  . aspirin  325 mg Oral Daily  . aspirin  325 mg Oral Once  . atorvastatin  40 mg Oral q1800  . clonazePAM  1 mg Oral QHS  . [START ON 12/20/2015] divalproex  250 mg Oral q morning - 10a  . enoxaparin (LOVENOX) injection  40 mg Subcutaneous Q24H  . [START ON 12/21/2015] predniSONE  5 mg Oral Q48H  . pregabalin  25 mg Oral QHS  . venlafaxine XR  150 mg Oral Daily   Continuous Infusions:  PRN Meds:.HYDROcodone-acetaminophen, senna-docusate     Results for orders placed or performed during the hospital encounter of 12/19/15 (from the past 48 hour(s))  CBG monitoring, ED     Status: Abnormal   Collection Time: 12/19/15  9:53 AM  Result Value Ref Range   Glucose-Capillary 133 (H)  65 - 99 mg/dL  I-stat troponin, ED (not at [facility], [facility])     Status: None   Collection Time: 12/19/15 10:16 AM  Result Value Ref Range   Troponin i, poc 0.00 0.00 - 0.08 ng/mL   Comment 3            Comment: Due to the release kinetics of cTnI, a negative result within the first hours of the onset of symptoms does not rule out myocardial infarction with certainty. If myocardial infarction is still suspected, repeat the test at appropriate intervals.   I-Stat Chem 8, ED  (not at [facility], [facility])     Status: Abnormal   Collection Time: 12/19/15 10:17 AM  Result Value Ref Range   Sodium 140 135 - 145 mmol/L   Potassium 4.0 3.5 - 5.1 mmol/L   Chloride 101 101 - 111 mmol/L   BUN 20 6 - 20 mg/dL   Creatinine, Ser 1.00 0.61 - 1.24 mg/dL   Glucose, Bld 142 (H) 65 - 99 mg/dL   Calcium, Ion 1.20 1.13 - 1.30 mmol/L   TCO2 26 0 - 100 mmol/L   Hemoglobin 14.6 13.0 - 17.0 g/dL   HCT 43.0 39.0 - 52.0 %  Ethanol     Status: None   Collection Time: 12/19/15 10:20 AM  Result Value Ref Range   Alcohol, Ethyl (B) <5 <5 mg/dL    Comment:        LOWEST DETECTABLE LIMIT FOR SERUM ALCOHOL IS 5 mg/dL FOR MEDICAL PURPOSES ONLY   Protime-INR     Status: None   Collection Time: 12/19/15 10:20 AM  Result Value Ref Range   Prothrombin Time 15.0 11.6 - 15.2 seconds   INR 1.16 0.00 - 1.49  APTT     Status: None   Collection Time: 12/19/15 10:20 AM  Result Value Ref Range   aPTT 30 24 - 37 seconds  CBC     Status: Abnormal   Collection Time: 12/19/15 10:20 AM  Result Value Ref Range   WBC 8.8 4.0 - 10.5 K/uL   RBC 4.21 (L) 4.22 - 5.81 MIL/uL   Hemoglobin 13.2 13.0 - 17.0 g/dL   HCT 40.6 39.0 - 52.0 %   MCV 96.4 78.0 - 100.0 fL   MCH 31.4 26.0 - 34.0 pg   MCHC 32.5 30.0 - 36.0 g/dL  RDW 13.7 11.5 - 15.5 %   Platelets 227 150 - 400 K/uL  Differential     Status: None   Collection Time: 12/19/15 10:20 AM  Result Value Ref Range   Neutrophils Relative % 74 %   Neutro Abs 6.6 1.7 - 7.7 K/uL    Lymphocytes Relative 13 %   Lymphs Abs 1.1 0.7 - 4.0 K/uL   Monocytes Relative 8 %   Monocytes Absolute 0.7 0.1 - 1.0 K/uL   Eosinophils Relative 5 %   Eosinophils Absolute 0.4 0.0 - 0.7 K/uL   Basophils Relative 0 %   Basophils Absolute 0.0 0.0 - 0.1 K/uL  Comprehensive metabolic panel     Status: Abnormal   Collection Time: 12/19/15 10:20 AM  Result Value Ref Range   Sodium 139 135 - 145 mmol/L   Potassium 4.0 3.5 - 5.1 mmol/L   Chloride 103 101 - 111 mmol/L   CO2 28 22 - 32 mmol/L   Glucose, Bld 147 (H) 65 - 99 mg/dL   BUN 21 (H) 6 - 20 mg/dL   Creatinine, Ser 1.11 0.61 - 1.24 mg/dL   Calcium 9.1 8.9 - 10.3 mg/dL   Total Protein 6.8 6.5 - 8.1 g/dL   Albumin 3.8 3.5 - 5.0 g/dL   AST 29 15 - 41 U/L   ALT 34 17 - 63 U/L   Alkaline Phosphatase 58 38 - 126 U/L   Total Bilirubin 0.8 0.3 - 1.2 mg/dL   GFR calc non Af Amer 59 (L) >60 mL/min   GFR calc Af Amer >60 >60 mL/min    Comment: (NOTE) The eGFR has been calculated using the CKD EPI equation. This calculation has not been validated in all clinical situations. eGFR's persistently <60 mL/min signify possible Chronic Kidney Disease.    Anion gap 8 5 - 15  Sedimentation rate     Status: None   Collection Time: 12/19/15 10:20 AM  Result Value Ref Range   Sed Rate 15 0 - 16 mm/hr  C-reactive protein     Status: None   Collection Time: 12/19/15 10:20 AM  Result Value Ref Range   CRP 0.8 <1.0 mg/dL    Comment: Performed at [facility]  Urine rapid drug screen (hosp performed)not at [facility]     Status: Abnormal   Collection Time: 12/19/15 12:30 PM  Result Value Ref Range   Opiates POSITIVE (A) NONE DETECTED   Cocaine NONE DETECTED NONE DETECTED   Benzodiazepines POSITIVE (A) NONE DETECTED   Amphetamines NONE DETECTED NONE DETECTED   Tetrahydrocannabinol NONE DETECTED NONE DETECTED   Barbiturates NONE DETECTED NONE DETECTED    Comment:        DRUG SCREEN FOR MEDICAL PURPOSES ONLY.  IF CONFIRMATION IS NEEDED FOR ANY  PURPOSE, NOTIFY LAB WITHIN 5 DAYS.        LOWEST DETECTABLE LIMITS FOR URINE DRUG SCREEN Drug Class       Cutoff (ng/mL) Amphetamine      1000 Barbiturate      200 Benzodiazepine   878 Tricyclics       676 Opiates          300 Cocaine          300 THC              50   Urinalysis, Routine w reflex microscopic (not at [facility])     Status: Abnormal   Collection Time: 12/19/15 12:30 PM  Result Value Ref Range   Color, Urine  YELLOW YELLOW   APPearance CLEAR CLEAR   Specific Gravity, Urine 1.015 1.005 - 1.030   pH 7.0 5.0 - 8.0   Glucose, UA NEGATIVE NEGATIVE mg/dL   Hgb urine dipstick NEGATIVE NEGATIVE   Bilirubin Urine NEGATIVE NEGATIVE   Ketones, ur TRACE (A) NEGATIVE mg/dL   Protein, ur NEGATIVE NEGATIVE mg/dL   Nitrite NEGATIVE NEGATIVE   Leukocytes, UA NEGATIVE NEGATIVE    Comment: MICROSCOPIC NOT DONE ON URINES WITH NEGATIVE PROTEIN, BLOOD, LEUKOCYTES, NITRITE, OR GLUCOSE <1000 mg/dL.    Studies/Results:  BRAIN MRI/MRA  1. Small clustered acute infarcts in the left posterior frontal and parietal cortex and subcortical white matter, posterior MCA territory. No superimposed hemorrhage. 2. 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 No major vessel/treatable occlusion.    Arshiya Jakes A. Merlene Bridges, M.D.  Diplomate, Tax adviser of Psychiatry and Neurology ( Neurology). 12/19/2015, 6:32 PM

## 2015-12-19 NOTE — Telephone Encounter (Signed)
12/19/15 daughter called to apologize for missing the 1/17 appt.  She said that her dad has been having migraines and it is affecting his speech.  He has a drs. appt today and she went ahead and cancelled the remaining appts for this week.  She said that she would call us back to let us know about the remaining appts.

## 2015-12-19 NOTE — ED Notes (Signed)
Called unit to give report, stated that they would call back once room ok'd

## 2015-12-19 NOTE — H&P (Signed)
[facility] History and Physical  Jared Bridges [id number] DOB: [birth year]    PCP:   Doree Albee, MD   Chief Complaint: Slurred speech and misnomer.   HPI: Jared Bridges is an 82 y.o. male with hx of chronic HA on Depakote, DM2, HTN, HLD, hx of GI bleed not on ASA, PMR on steroids, returned to the ER after being seen with HA a few days ago, with complaint of "diffiulty with words", slurred speech, and HA.  He did not have focal weakness, or visual changes.  Symptoms waxes and wanes.  Work up in the ER included an MRI/MRA of the brain which showed acute infarcts of the left posterior frontal and parietal cortex and no major treatable occlusion.  His serology was unremarkable.  He was given an ASA and hospitalist was asked to admit him for acute CVA.  His EKG showed NSR with no acute ST T changes, and serology was normal with BS of 147.  Hospitalist was asked to admit him for further evaluation.  Neurology was consulted by [place] physician as well.    Rewiew of Systems:  Constitutional: Negative for malaise, fever and chills. No significant weight loss or weight gain Eyes: Negative for eye pain, redness and discharge, diplopia, visual changes, or flashes of light. ENMT: Negative for ear pain, hoarseness, nasal congestion, sinus pressure and sore throat. No headaches; tinnitus, drooling, or problem swallowing. Cardiovascular: Negative for palpitations, diaphoresis, dyspnea and peripheral edema. ; No orthopnea, PND Respiratory: Negative for cough, hemoptysis, wheezing and stridor. No pleuritic chestpain. Gastrointestinal: Negative for nausea, vomiting, diarrhea, constipation, abdominal pain, melena, blood in stool, hematemesis, jaundice and rectal bleeding.    Genitourinary: Negative for frequency, dysuria, incontinence,flank pain and hematuria; Musculoskeletal: Negative for back pain and neck pain. Negative for swelling and trauma.;  Skin: . Negative for pruritus, rash, abrasions,  bruising and skin lesion.; ulcerations Neuro: Negative for lightheadedness and neck stiffness. Negative for weakness, altered level of consciousness , altered mental status, extremity weakness, burning feet, involuntary movement, seizure and syncope.  Psych: negative for anxiety, depression, insomnia, tearfulness, panic attacks, hallucinations, paranoia, suicidal or homicidal ideation    Past Medical History  Diagnosis Date  . Chronic headaches   . Type 2 diabetes mellitus ([place])   . Essential hypertension   . Diverticulitis   . Syncope   . Hypercholesteremia   . GI bleed     Dr. Laural Golden - 1998  . Peptic ulcer disease   . Anemia, iron deficiency   . Depression   . Chronic kidney disease   . Peripheral neuropathy ([place])   . Polymyalgia rheumatica ([name]     Past Surgical History  Procedure Laterality Date  . Uvulectomy  2004  . Spinal fusion    . Spinal fusion    . Colon surgery      FOR DIVERTICULOSIS  . Tonsillectomy    . Eye surgery      right cataract with lens implant  . Appendectomy      as child  . Hernia repair      1-right inguinal, 3- left inguinal  . Total knee arthroplasty Left 05/28/2015    Procedure: LEFT TOTAL KNEE ARTHROPLASTY;  Surgeon: Gaynelle Arabian, MD;  Location: [facility];  Service: Orthopedics;  Laterality: Left;  . Irrigation and debridement knee Left 07/09/2015    Procedure: LEFT KNEE IRRIGATION AND DEBRIDEMENT WOUND CLOSURE;  Surgeon: Gaynelle Arabian, MD;  Location: [facility];  Service: Orthopedics;  Laterality: Left;    Medications:  HOME MEDS: Prior to Admission medications   Medication Sig Start Date End Date Taking? Authorizing Provider  bacitracin ointment Apply 1 application topically 2 (two) times daily. 10/08/15  Yes Noemi Chapel, MD  Cholecalciferol (VITAMIN D3) 5000 UNITS TABS Take 5,000 Units by mouth at bedtime.   Yes Historical Provider, MD  clonazePAM (KLONOPIN) 1 MG tablet Take 1 tablet (1 mg total) by mouth at bedtime. 11/22/15 11/21/16 Yes  Carmen Dohmeier, MD  diclofenac sodium (VOLTAREN) 1 % GEL Apply 2 g topically daily as needed (for pain). 11/22/15  Yes Asencion Partridge Dohmeier, MD  divalproex (DEPAKOTE) 250 MG DR tablet Take 1 tablet (250 mg total) by mouth every morning. 11/22/15  Yes Carmen Dohmeier, MD  feeding supplement, GLUCERNA SHAKE, (GLUCERNA SHAKE) LIQD Take 237 mLs by mouth 2 (two) times daily between meals. Patient taking differently: Take 237 mLs by mouth daily as needed.  08/18/14  Yes Radene Gunning, NP  HYDROcodone-acetaminophen (NORCO/VICODIN) 5-325 MG per tablet Take 1-2 tablets by mouth every 4 (four) hours as needed for moderate pain. Patient taking differently: Take 0.5-1 tablets by mouth every 6 (six) hours as needed for moderate pain. Uses as needed for migraine pain 07/12/15  Yes Arlee Muslim, PA-C  predniSONE (DELTASONE) 5 MG tablet Take 5 mg by mouth every other day. Taking Every Other morning for PMR Arthritis 10/03/14  Yes Historical Provider, MD  pregabalin (LYRICA) 25 MG capsule Take 25 mg by mouth at bedtime.   Yes Historical Provider, MD  venlafaxine XR (EFFEXOR-XR) 150 MG 24 hr capsule Take 1 capsule (150 mg total) by mouth daily. 09/12/15 09/11/16 Yes Cloria Spring, MD     Allergies:  Allergies  Allergen Reactions  . Iodinated Diagnostic Agents Hives and Itching    Other reaction(s): Other (See Comments) Other Reaction: iv contrast=hives  . Cymbalta [Duloxetine Hcl] Other (See Comments)    Makes patient have jerking motions.  . Iodine Hives    very allergic(per daughter)  . Latex Other (See Comments)    Redness iritation  . Lisinopril Other (See Comments)    Makes patient have jerking motions.  . Neurontin [Gabapentin] Other (See Comments)    Makes patient have jerking motions.  . Tape Other (See Comments)    Skin irritation  . Zoloft [Sertraline Hcl] Other (See Comments) and Diarrhea    Makes patient have jerking motions.    Social History:   reports that he quit smoking about 29 years  ago. His smoking use included Cigarettes. He started smoking about 57 years ago. He smoked 0.10 packs per day. He has never used smokeless tobacco. He reports that he does not drink alcohol or use illicit drugs.  Family History: Family History  Problem Relation Age of Onset  . Hypertension    . Depression Brother   . Depression Daughter   . Colon cancer Sister      Physical Exam: Filed Vitals:   12/19/15 1200 12/19/15 1230 12/19/15 1300 12/19/15 1315  BP: 144/89 146/72 171/88   Pulse:   64 64  Temp:      TempSrc:      Resp: 24 20 17 23   SpO2:   99% 96%   Blood pressure 171/88, pulse 64, temperature 98.4 F (36.9 C), temperature source Oral, resp. rate 23, SpO2 96 %.  GEN:  Pleasant  patient lying in the stretcher in no acute distress; cooperative with exam. PSYCH:  alert and oriented x4; does not appear anxious or depressed; affect is appropriate. HEENT: Mucous  membranes pink and anicteric; PERRLA; EOM intact; no cervical lymphadenopathy nor thyromegaly or carotid bruit; no JVD; There were no stridor. Neck is very supple. Breasts:: Not examined CHEST WALL: No tenderness CHEST: Normal respiration, clear to auscultation bilaterally.  HEART: Regular rate and rhythm.  There are no murmur, rub, or gallops.   BACK: No kyphosis or scoliosis; no CVA tenderness ABDOMEN: soft and non-tender; no masses, no organomegaly, normal abdominal bowel sounds; no pannus; no intertriginous candida. There is no rebound and no distention. Rectal Exam: Not done EXTREMITIES: No bone or joint deformity; age-appropriate arthropathy of the hands and knees; no edema; no ulcerations.  There is no calf tenderness. Genitalia: not examined PULSES: 2+ and symmetric SKIN: Normal hydration no rash or ulceration CNS: Cranial nerves 2-12 grossly intact no focal lateralizing neurologic deficit.  Speech is fluent; uvula elevated with phonation, facial symmetry and tongue midline. DTR are normal bilaterally, cerebella  exam is intact, barbinski is negative and strengths are equaled bilaterally.  No sensory loss.   Labs on Admission:  Basic Metabolic Panel:  Recent Labs Lab 12/17/15 1725 12/17/15 1806 12/19/15 1017 12/19/15 1020  NA 138 139 140 139  K 4.1 3.9 4.0 4.0  CL 102 100* 101 103  CO2 28  --   --  28  GLUCOSE 111* 103* 142* 147*  BUN 23* 23* 20 21*  CREATININE 1.03 1.00 1.00 1.11  CALCIUM 9.1  --   --  9.1   Liver Function Tests:  Recent Labs Lab 12/17/15 1725 12/19/15 1020  AST 43* 29  ALT 33 34  ALKPHOS 64 58  BILITOT 0.8 0.8  PROT 7.0 6.8  ALBUMIN 3.8 3.8   No results for input(s): LIPASE, AMYLASE in the last 168 hours. No results for input(s): AMMONIA in the last 168 hours. CBC:  Recent Labs Lab 12/17/15 1725 12/17/15 1806 12/19/15 1017 12/19/15 1020  WBC 9.8  --   --  8.8  NEUTROABS 6.1  --   --  6.6  HGB 13.8 15.0 14.6 13.2  HCT 42.1 44.0 43.0 40.6  MCV 95.2  --   --  96.4  PLT 227  --   --  227    CBG:  Recent Labs Lab 12/17/15 1723 12/17/15 1838 12/19/15 0953  GLUCAP 102* 90 133*     Radiological Exams on Admission: Ct Head Wo Contrast  12/17/2015  CLINICAL DATA:  Dysarthria began 40 minutes ago getting worse EXAM: CT HEAD WITHOUT CONTRAST TECHNIQUE: Contiguous axial images were obtained from the base of the skull through the vertex without intravenous contrast. COMPARISON:  Multiple prior studies FINDINGS: Age-related moderate atrophy. Mild low attenuation in the periventricular white matter bilaterally. No focal attenuation change to suggest mass or vascular territory infarct. No hemorrhage or extra-axial fluid. No hydrocephalus. Calvarium intact. IMPRESSION: Age-related involutional change with no acute findings. Critical Value/emergent results were called by telephone at the time of interpretation on 12/17/2015 at 5:52 pm to Dr. Delora Fuel , who verbally acknowledged these results. Electronically Signed   By: Skipper Cliche M.D.   On: 12/17/2015  17:52   Mr [name] Head Wo Contrast  12/19/2015  CLINICAL DATA:  Weakness and confusion for 3 days. EXAM: MRI HEAD WITHOUT CONTRAST MRA HEAD WITHOUT CONTRAST TECHNIQUE: Multiplanar, multiecho pulse sequences of the brain and surrounding structures were obtained without intravenous contrast. Angiographic images of the head were obtained using MRA technique without contrast. COMPARISON:  10/08/2015 brain MRI.  Head CT from 2 days ago FINDINGS: MRI  HEAD FINDINGS Calvarium and upper cervical spine: No focal marrow signal abnormality. Orbits: Right cataract resection.  No acute finding. 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 No acute finding . Brain: Small (1 cm or less) acute infarcts in the left posterior frontal and parietal cortex and subcortical white matter which are clustered. These are in the MCA and posterior border zone territory. No superimposed hemorrhage. Chronic small vessel disease with patchy ischemic gliosis in the pons and in the deep cerebral white matter. Generalized cerebral volume loss, age congruent MRA HEAD FINDINGS Severely limited due to patient motion. Patent bilateral vertebral arteries with proximal branches showing patency. Patent basilar without indication of stenosis. Apparent outpouching from the left upper basilar is attributed to motion artifact. An aneurysm of this size should be visible on coronal T2 weighted imaging, but this region appears normal on conventional MR. Probable hypoplastic or aplastic left A1 segment. Otherwise the proximal circulation is patent. IMPRESSION: 1. Small clustered acute infarcts in the left posterior frontal and parietal cortex and subcortical white matter, posterior MCA territory. No superimposed hemorrhage. 2. 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 No major vessel/treatable occlusion. Electronically Signed   By: Monte Fantasia M.D.   On: 12/19/2015 11:26   Mr Brain Wo Contrast  12/19/2015  CLINICAL DATA:  Weakness and  confusion for 3 days. EXAM: MRI HEAD WITHOUT CONTRAST MRA HEAD WITHOUT CONTRAST TECHNIQUE: Multiplanar, multiecho pulse sequences of the brain and surrounding structures were obtained without intravenous contrast. Angiographic images of the head were obtained using MRA technique without contrast. COMPARISON:  10/08/2015 brain MRI.  Head CT from 2 days ago FINDINGS: MRI HEAD FINDINGS Calvarium and upper cervical spine: No focal marrow signal abnormality. Orbits: Right cataract resection.  No acute finding. 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 No acute finding . Brain: Small (1 cm or less) acute infarcts in the left posterior frontal and parietal cortex and subcortical white matter which are clustered. These are in the MCA and posterior border zone territory. No superimposed hemorrhage. Chronic small vessel disease with patchy ischemic gliosis in the pons and in the deep cerebral white matter. Generalized cerebral volume loss, age congruent MRA HEAD FINDINGS Severely limited due to patient motion. Patent bilateral vertebral arteries with proximal branches showing patency. Patent basilar without indication of stenosis. Apparent outpouching from the left upper basilar is attributed to motion artifact. An aneurysm of this size should be visible on coronal T2 weighted imaging, but this region appears normal on conventional MR. Probable hypoplastic or aplastic left A1 segment. Otherwise the proximal circulation is patent. IMPRESSION: 1. Small clustered acute infarcts in the left posterior frontal and parietal cortex and subcortical white matter, posterior MCA territory. No superimposed hemorrhage. 2. 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 No major vessel/treatable occlusion. Electronically Signed   By: Monte Fantasia M.D.   On: 12/19/2015 11:26    EKG: Independently reviewed.    Assessment/Plan Present on Admission:  . CVA (cerebral infarction) . Acute CVA (cerebrovascular  accident) ([place]) . Carotid stenosis . 1St degree AV block . Cardiac murmur . Disease of thyroid gland . Essential (primary) hypertension . Hypercholesterolemia  PLAN:  Will admit him for acute stroke work up.  This will included carotid US and ECHO.  Will check homocysteine level.  I will start him on ASA per day.  Will add PPI for GI prophylaxis.  Given now that he has a CVA, benefit of ASA outweight risk of GI bleed.  I noted that he is on Prednisone as well, which will increase risk of GI bleed.  Will start moderate dose of Lipitor as well.  Check lipid profile.  For his murmur, will get ECHO result.  I will continue his Depakote for his HA, and he should avoid trytans.   His BP is slightly elevated and we will allow permissive HTN.  He is stable, full code, and will be admitted to [facility] service.  Thank you and Good Day.   Other plans as per orders.  Code Status: FULL Haskel Khan, MD. FACP [facility] Pager [phone] 7pm to 7am.  12/19/2015, 1:32 PM

## 2015-12-19 NOTE — ED Provider Notes (Signed)
CSN: [id number]     Arrival date & time 12/19/15  0946 History   First MD Initiated Contact with Patient 12/19/15 [phone]     Chief Complaint  Patient presents with  . Aphasia     (Consider location/radiation/quality/duration/timing/severity/associated sxs/prior Treatment) Patient is a 82 y.o. male presenting with neurologic complaint.  Neurologic Problem This is a new problem. The current episode started 3 to 5 hours ago. The problem occurs constantly. The problem has been resolved. Associated symptoms include headaches. Pertinent negatives include no chest pain and no shortness of breath. Nothing aggravates the symptoms. Nothing relieves the symptoms. He has tried nothing for the symptoms. The treatment provided no relief.    Past Medical History  Diagnosis Date  . Chronic headaches   . Type 2 diabetes mellitus ([place])   . Essential hypertension   . Diverticulitis   . Syncope   . Hypercholesteremia   . GI bleed     Dr. Laural Golden - 1998  . Peptic ulcer disease   . Anemia, iron deficiency   . Depression   . Chronic kidney disease   . Peripheral neuropathy ([place])   . Polymyalgia rheumatica ([name]    Past Surgical History  Procedure Laterality Date  . Uvulectomy  2004  . Spinal fusion    . Spinal fusion    . Colon surgery      FOR DIVERTICULOSIS  . Tonsillectomy    . Eye surgery      right cataract with lens implant  . Appendectomy      as child  . Hernia repair      1-right inguinal, 3- left inguinal  . Total knee arthroplasty Left 05/28/2015    Procedure: LEFT TOTAL KNEE ARTHROPLASTY;  Surgeon: Gaynelle Arabian, MD;  Location: [facility];  Service: Orthopedics;  Laterality: Left;  . Irrigation and debridement knee Left 07/09/2015    Procedure: LEFT KNEE IRRIGATION AND DEBRIDEMENT WOUND CLOSURE;  Surgeon: Gaynelle Arabian, MD;  Location: [facility];  Service: Orthopedics;  Laterality: Left;   Family History  Problem Relation Age of Onset  . Hypertension    . Depression Brother   .  Depression Daughter   . Colon cancer Sister    Social History  Substance Use Topics  . Smoking status: Former Smoker -- 0.10 packs/day    Types: Cigarettes    Start date: 12/18/1958    Quit date: 12/01/1986  . Smokeless tobacco: Never Used  . Alcohol Use: No    Review of Systems  Constitutional: Negative for fever and chills.  Eyes: Negative for photophobia and pain.  Respiratory: Negative for cough and shortness of breath.   Cardiovascular: Negative for chest pain.  Neurological: Positive for speech difficulty and headaches. Negative for dizziness, tremors, facial asymmetry and weakness.  All other systems reviewed and are negative.     Allergies  Iodinated diagnostic agents; Cymbalta; Iodine; Latex; Lisinopril; Neurontin; Tape; and Zoloft  Home Medications   Prior to Admission medications   Medication Sig Start Date End Date Taking? Authorizing Provider  bacitracin ointment Apply 1 application topically 2 (two) times daily. 10/08/15  Yes Noemi Chapel, MD  Cholecalciferol (VITAMIN D3) 5000 UNITS TABS Take 5,000 Units by mouth at bedtime.   Yes Historical Provider, MD  clonazePAM (KLONOPIN) 1 MG tablet Take 1 tablet (1 mg total) by mouth at bedtime. 11/22/15 11/21/16 Yes Carmen Dohmeier, MD  diclofenac sodium (VOLTAREN) 1 % GEL Apply 2 g topically daily as needed (for pain). 11/22/15  Yes Larey Seat, MD  divalproex (DEPAKOTE) 250 MG DR tablet Take 1 tablet (250 mg total) by mouth every morning. 11/22/15  Yes Carmen Dohmeier, MD  feeding supplement, GLUCERNA SHAKE, (GLUCERNA SHAKE) LIQD Take 237 mLs by mouth 2 (two) times daily between meals. Patient taking differently: Take 237 mLs by mouth daily as needed.  08/18/14  Yes Radene Gunning, NP  HYDROcodone-acetaminophen (NORCO/VICODIN) 5-325 MG per tablet Take 1-2 tablets by mouth every 4 (four) hours as needed for moderate pain. Patient taking differently: Take 0.5-1 tablets by mouth every 6 (six) hours as needed for moderate  pain. Uses as needed for migraine pain 07/12/15  Yes Arlee Muslim, PA-C  predniSONE (DELTASONE) 5 MG tablet Take 5 mg by mouth every other day. Taking Every Other morning for PMR Arthritis 10/03/14  Yes Historical Provider, MD  pregabalin (LYRICA) 25 MG capsule Take 25 mg by mouth at bedtime.   Yes Historical Provider, MD  venlafaxine XR (EFFEXOR-XR) 150 MG 24 hr capsule Take 1 capsule (150 mg total) by mouth daily. 09/12/15 09/11/16 Yes Cloria Spring, MD   BP 156/57 mmHg  Pulse 65  Temp(Src) 98.1 F (36.7 C) (Oral)  Resp 20  SpO2 96% Physical Exam  Constitutional: He is oriented to person, place, and time. He appears well-developed and well-nourished.  HENT:  Head: Normocephalic and atraumatic.  Neck: Normal range of motion. Carotid bruit is not present.  Cardiovascular: Normal rate, regular rhythm and normal heart sounds.  Exam reveals no friction rub.   No murmur heard. Pulmonary/Chest: Effort normal. No respiratory distress.  Abdominal: He exhibits no distension.  Musculoskeletal: Normal range of motion.  Neurological: He is alert and oriented to person, place, and time. No cranial nerve deficit.  No altered mental status, able to give full seemingly accurate history.  Face is symmetric, EOM's intact, pupils equal and reactive, vision intact, tongue and uvula midline without deviation Upper and Lower extremity motor 5/5, intact pain perception in distal extremities, 2+ reflexes in biceps, patella and achilles tendons. Finger to nose normal, heel to shin normal. Walks without assistance or evident ataxia.   Skin: Skin is warm and dry. No rash noted. No erythema.  Nursing note and vitals reviewed.   ED Course  Procedures (including critical care time) Labs Review Labs Reviewed  CBC - Abnormal; Notable for the following:    RBC 4.21 (*)    All other components within normal limits  COMPREHENSIVE METABOLIC PANEL - Abnormal; Notable for the following:    Glucose, Bld 147 (*)     BUN 21 (*)    GFR calc non Af Amer 59 (*)    All other components within normal limits  URINE RAPID DRUG SCREEN, HOSP PERFORMED - Abnormal; Notable for the following:    Opiates POSITIVE (*)    Benzodiazepines POSITIVE (*)    All other components within normal limits  URINALYSIS, ROUTINE W REFLEX MICROSCOPIC (NOT AT [facility]) - Abnormal; Notable for the following:    Ketones, ur TRACE (*)    All other components within normal limits  CBG MONITORING, ED - Abnormal; Notable for the following:    Glucose-Capillary 133 (*)    All other components within normal limits  I-STAT CHEM 8, ED - Abnormal; Notable for the following:    Glucose, Bld 142 (*)    All other components within normal limits  ETHANOL  PROTIME-INR  APTT  DIFFERENTIAL  SEDIMENTATION RATE  C-REACTIVE PROTEIN  I-STAT TROPOININ, ED    Imaging Review Ct Head Wo Contrast  12/17/2015  CLINICAL DATA:  Dysarthria began 40 minutes ago getting worse EXAM: CT HEAD WITHOUT CONTRAST TECHNIQUE: Contiguous axial images were obtained from the base of the skull through the vertex without intravenous contrast. COMPARISON:  Multiple prior studies FINDINGS: Age-related moderate atrophy. Mild low attenuation in the periventricular white matter bilaterally. No focal attenuation change to suggest mass or vascular territory infarct. No hemorrhage or extra-axial fluid. No hydrocephalus. Calvarium intact. IMPRESSION: Age-related involutional change with no acute findings. Critical Value/emergent results were called by telephone at the time of interpretation on 12/17/2015 at 5:52 pm to Dr. Delora Fuel , who verbally acknowledged these results. Electronically Signed   By: Skipper Cliche M.D.   On: 12/17/2015 17:52   Mr [name] Head Wo Contrast  12/19/2015  CLINICAL DATA:  Weakness and confusion for 3 days. EXAM: MRI HEAD WITHOUT CONTRAST MRA HEAD WITHOUT CONTRAST TECHNIQUE: Multiplanar, multiecho pulse sequences of the brain and surrounding structures were  obtained without intravenous contrast. Angiographic images of the head were obtained using MRA technique without contrast. COMPARISON:  10/08/2015 brain MRI.  Head CT from 2 days ago FINDINGS: MRI HEAD FINDINGS Calvarium and upper cervical spine: No focal marrow signal abnormality. Orbits: Right cataract resection.  No acute finding. 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 No acute finding . Brain: Small (1 cm or less) acute infarcts in the left posterior frontal and parietal cortex and subcortical white matter which are clustered. These are in the MCA and posterior border zone territory. No superimposed hemorrhage. Chronic small vessel disease with patchy ischemic gliosis in the pons and in the deep cerebral white matter. Generalized cerebral volume loss, age congruent MRA HEAD FINDINGS Severely limited due to patient motion. Patent bilateral vertebral arteries with proximal branches showing patency. Patent basilar without indication of stenosis. Apparent outpouching from the left upper basilar is attributed to motion artifact. An aneurysm of this size should be visible on coronal T2 weighted imaging, but this region appears normal on conventional MR. Probable hypoplastic or aplastic left A1 segment. Otherwise the proximal circulation is patent. IMPRESSION: 1. Small clustered acute infarcts in the left posterior frontal and parietal cortex and subcortical white matter, posterior MCA territory. No superimposed hemorrhage. 2. 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 No major vessel/treatable occlusion. Electronically Signed   By: Monte Fantasia M.D.   On: 12/19/2015 11:26   Mr Brain Wo Contrast  12/19/2015  CLINICAL DATA:  Weakness and confusion for 3 days. EXAM: MRI HEAD WITHOUT CONTRAST MRA HEAD WITHOUT CONTRAST TECHNIQUE: Multiplanar, multiecho pulse sequences of the brain and surrounding structures were obtained without intravenous contrast. Angiographic images of the head were  obtained using MRA technique without contrast. COMPARISON:  10/08/2015 brain MRI.  Head CT from 2 days ago FINDINGS: MRI HEAD FINDINGS Calvarium and upper cervical spine: No focal marrow signal abnormality. Orbits: Right cataract resection.  No acute finding. 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 No acute finding . Brain: Small (1 cm or less) acute infarcts in the left posterior frontal and parietal cortex and subcortical white matter which are clustered. These are in the MCA and posterior border zone territory. No superimposed hemorrhage. Chronic small vessel disease with patchy ischemic gliosis in the pons and in the deep cerebral white matter. Generalized cerebral volume loss, age congruent MRA HEAD FINDINGS Severely limited due to patient motion. Patent bilateral vertebral arteries with proximal branches showing patency. Patent basilar without indication of stenosis. Apparent outpouching from the left upper basilar  is attributed to motion artifact. An aneurysm of this size should be visible on coronal T2 weighted imaging, but this region appears normal on conventional MR. Probable hypoplastic or aplastic left A1 segment. Otherwise the proximal circulation is patent. IMPRESSION: 1. Small clustered acute infarcts in the left posterior frontal and parietal cortex and subcortical white matter, posterior MCA territory. No superimposed hemorrhage. 2. 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 No major vessel/treatable occlusion. Electronically Signed   By: Monte Fantasia M.D.   On: 12/19/2015 11:26   I have personally reviewed and evaluated these images and lab results as part of my medical decision-making.   EKG Interpretation   Date/Time:  Wednesday December 19 2015 09:54:45 EST Ventricular Rate:  68 PR Interval:  211 QRS Duration: 105 QT Interval:  432 QTC Calculation: 459 R Axis:   -40 Text Interpretation:  Sinus rhythm Left anterior fascicular block Low  voltage,  precordial leads No significant change since last tracing   12/17/2015 Confirmed by [facility] MD, Corene Cornea [phone]) on 12/19/2015 10:24:17 AM      MDM   Final diagnoses:  Acute CVA (cerebrovascular accident) ([place])  Carotid stenosis, bilateral   Intermittent dysarthria associated with a headache. Symptoms before. Worked up previously multiple times, found to be related to migraines. Here with multiple episodes since last visit on Monday. Exam normal. Not dysarthric now. No HA now. Will d/w neurology about utility of MRI and/or further workup.  MRI obtained and showed acute stroke. Unknown onset and resolved symptoms so not a tPA candidate, discussed with medicine and will admit to them.    Merrily Pew, MD 12/19/15 1539

## 2015-12-19 NOTE — ED Notes (Addendum)
Pt had slurred speech Monday that resolved. Pt daughter came home yesterday afternoon and noticed that pt speech was slurred, again resolved.  Pt did not fall asleep last night, family noticed slurred speech at 0500 and according to family, speech has returned to normal. Pt also has migraines and has had h/a in past few days. Pt has no h/a at this time.  Pt ambulatory after refusing wheelchair.  Gait is steady.

## 2015-12-19 NOTE — ED Notes (Signed)
Pt is currently in MRI

## 2015-12-19 NOTE — ED Notes (Signed)
Pt states that he had twitches to chest since last night but has "been tapering off" per pt

## 2015-12-20 ENCOUNTER — Observation Stay (HOSPITAL_COMMUNITY): Payer: Medicare Other

## 2015-12-20 ENCOUNTER — Ambulatory Visit (HOSPITAL_COMMUNITY): Payer: Medicare Other

## 2015-12-20 DIAGNOSIS — M353 Polymyalgia rheumatica: Secondary | ICD-10-CM | POA: Diagnosis present

## 2015-12-20 DIAGNOSIS — E785 Hyperlipidemia, unspecified: Secondary | ICD-10-CM | POA: Diagnosis present

## 2015-12-20 DIAGNOSIS — E7800 Pure hypercholesterolemia, unspecified: Secondary | ICD-10-CM | POA: Diagnosis present

## 2015-12-20 DIAGNOSIS — Z981 Arthrodesis status: Secondary | ICD-10-CM | POA: Diagnosis not present

## 2015-12-20 DIAGNOSIS — I440 Atrioventricular block, first degree: Secondary | ICD-10-CM | POA: Diagnosis present

## 2015-12-20 DIAGNOSIS — Z8249 Family history of ischemic heart disease and other diseases of the circulatory system: Secondary | ICD-10-CM | POA: Diagnosis not present

## 2015-12-20 DIAGNOSIS — N189 Chronic kidney disease, unspecified: Secondary | ICD-10-CM | POA: Diagnosis present

## 2015-12-20 DIAGNOSIS — I6523 Occlusion and stenosis of bilateral carotid arteries: Secondary | ICD-10-CM | POA: Diagnosis not present

## 2015-12-20 DIAGNOSIS — Z800 Family history of malignant neoplasm of digestive organs: Secondary | ICD-10-CM | POA: Diagnosis not present

## 2015-12-20 DIAGNOSIS — I639 Cerebral infarction, unspecified: Secondary | ICD-10-CM | POA: Diagnosis present

## 2015-12-20 DIAGNOSIS — I10 Essential (primary) hypertension: Secondary | ICD-10-CM

## 2015-12-20 DIAGNOSIS — Z79891 Long term (current) use of opiate analgesic: Secondary | ICD-10-CM | POA: Diagnosis not present

## 2015-12-20 DIAGNOSIS — Z7952 Long term (current) use of systemic steroids: Secondary | ICD-10-CM | POA: Diagnosis not present

## 2015-12-20 DIAGNOSIS — H9190 Unspecified hearing loss, unspecified ear: Secondary | ICD-10-CM | POA: Diagnosis present

## 2015-12-20 DIAGNOSIS — R51 Headache: Secondary | ICD-10-CM | POA: Diagnosis present

## 2015-12-20 DIAGNOSIS — Z87891 Personal history of nicotine dependence: Secondary | ICD-10-CM | POA: Diagnosis not present

## 2015-12-20 DIAGNOSIS — R072 Precordial pain: Secondary | ICD-10-CM | POA: Diagnosis not present

## 2015-12-20 DIAGNOSIS — I129 Hypertensive chronic kidney disease with stage 1 through stage 4 chronic kidney disease, or unspecified chronic kidney disease: Secondary | ICD-10-CM | POA: Diagnosis present

## 2015-12-20 DIAGNOSIS — Z818 Family history of other mental and behavioral disorders: Secondary | ICD-10-CM | POA: Diagnosis not present

## 2015-12-20 DIAGNOSIS — R011 Cardiac murmur, unspecified: Secondary | ICD-10-CM | POA: Diagnosis not present

## 2015-12-20 DIAGNOSIS — E1122 Type 2 diabetes mellitus with diabetic chronic kidney disease: Secondary | ICD-10-CM | POA: Diagnosis present

## 2015-12-20 DIAGNOSIS — G8929 Other chronic pain: Secondary | ICD-10-CM | POA: Diagnosis present

## 2015-12-20 DIAGNOSIS — E1142 Type 2 diabetes mellitus with diabetic polyneuropathy: Secondary | ICD-10-CM | POA: Diagnosis present

## 2015-12-20 DIAGNOSIS — I6529 Occlusion and stenosis of unspecified carotid artery: Secondary | ICD-10-CM | POA: Diagnosis present

## 2015-12-20 DIAGNOSIS — E079 Disorder of thyroid, unspecified: Secondary | ICD-10-CM | POA: Diagnosis present

## 2015-12-20 DIAGNOSIS — Z96652 Presence of left artificial knee joint: Secondary | ICD-10-CM | POA: Diagnosis present

## 2015-12-20 LAB — LIPID PANEL
CHOLESTEROL: 181 mg/dL (ref 0–200)
HDL: 36 mg/dL — ABNORMAL LOW (ref >40)
LDL CALC: 110 mg/dL — AB (ref 0–99)
Total CHOL/HDL Ratio: 5 RATIO
Triglycerides: 177 mg/dL — ABNORMAL HIGH (ref <150)
VLDL: 35 mg/dL (ref 0–40)

## 2015-12-20 MED ORDER — PERFLUTREN LIPID MICROSPHERE
1.0000 mL | INTRAVENOUS | Status: AC | PRN
  Filled 2015-12-20: qty 10

## 2015-12-20 MED ORDER — PANTOPRAZOLE SODIUM 40 MG PO TBEC: 40.0000 mg | DELAYED_RELEASE_TABLET | Freq: Every day | ORAL | Status: DC

## 2015-12-20 MED ORDER — ASPIRIN 81 MG PO CHEW: 81.0000 mg | CHEWABLE_TABLET | Freq: Every day | ORAL | Status: DC

## 2015-12-20 MED ORDER — SALINE SPRAY 0.65 % NA SOLN
1.0000 | NASAL | Status: DC | PRN
  Administered 2015-12-20: 1 via NASAL
  Filled 2015-12-20: qty 44

## 2015-12-20 MED ORDER — ATORVASTATIN CALCIUM 40 MG PO TABS: 40.0000 mg | ORAL_TABLET | Freq: Every day | ORAL | Status: DC

## 2015-12-20 NOTE — Evaluation (Signed)
Physical Therapy Evaluation Patient Details Name: Jared Bridges MRN: [id number] DOB: 01-13-1930 Today's Date: 12/20/2015   History of Present Illness  82yo white male who presented to ED after new Left forehead HA and single episode of anomia. ED writeup reports some slurred speech as well, which pt denies. PMH: 1st degree AV block, HOH, DM, HTN, chronic HA, L knee scope, and L TKA. Pt just recently (1/10) started PT in outpatient for post surgical  rehab related to L knee.   Clinical Impression  Pt received sitting EOB just returned from ultrasound. Pt is pleasant and conversational, with very mild anomic (word searching) which he does not acknowledge, and a single episode of major anomia. All strength, sensation, balance, coordination, and activity tolerance are all at baseline level of function, in BLE, BUE, and face. No additional PT services needed at thsi time, pertaining to this episode. Recommending patient resume OPPT recently started to address deficits adn impairment related to L knee surgery. PT signing off.     Follow Up Recommendations Other (comment)    Equipment Recommendations  None recommended by PT    Recommendations for Other Services       Precautions / Restrictions Precautions Precautions: None Restrictions Weight Bearing Restrictions: No      Mobility  Bed Mobility Overal bed mobility: Modified Independent             General bed mobility comments: recieved sitting at EOB   Transfers Overall transfer level: Independent                  Ambulation/Gait Ambulation/Gait assistance: Independent Ambulation Distance (Feet): 200 Feet     Gait velocity: 0.30m/s Gait velocity interpretation: <1.8 ft/sec, indicative of risk for recurrent falls General Gait Details: slow, and mildly unsteady but reports as baseline.   Stairs            Wheelchair Mobility    Modified Rankin (Stroke Patients Only)       Balance Overall balance  assessment: Modified Independent (LOB with eyes closed, SLS <2s bilat. )                                           Pertinent Vitals/Pain Pain Assessment: 0-10 Pain Score: 3  Pain Location: Left forehead HA Pain Intervention(s): Limited activity within patient's tolerance;Monitored during session    [name] expects to be discharged to:: Private residence Living Arrangements: Spouse/significant other Available Help at Discharge: Family;Available 24 hours/day Type of Home: House Home Access: Stairs to enter Entrance Stairs-Rails: None Entrance Stairs-Number of Steps: 2 Home Layout: Two level;Laundry or work area in basement;Able to live on main level with bedroom/bathroom Home Equipment: Kasandra Knudsen - single point;Bedside commode Additional Comments: has other equipment that belongs to wife, but patient is much taller than her. He uses a SPC when convenieint due to chronic DM neuropathy in bilat feet and related instability.     Prior Function Level of Independence: Independent with assistive device(s)               Hand Dominance   Dominant Hand: Right    Extremity/Trunk Assessment   Upper Extremity Assessment: Overall WFL for tasks assessed           Lower Extremity Assessment: Overall WFL for tasks assessed      Cervical / Trunk Assessment: Normal  Communication   Communication: Expressive  difficulties (very mild anomina throughotu, with one major anomic episode)  Cognition Arousal/Alertness: Awake/alert Behavior During Therapy: WFL for tasks assessed/performed Overall Cognitive Status: Within Functional Limits for tasks assessed                      General Comments      Exercises        Assessment/Plan    PT Assessment Patent does not need any further PT services (Continue with PT in OP previosuly taking, but not related to this issue. )  PT Diagnosis Altered mental status   PT Problem List    PT Treatment  Interventions     PT Goals (Current goals can be found in the Care Plan section) Acute Rehab PT Goals PT Goal Formulation: All assessment and education complete, DC therapy    Frequency     Barriers to discharge        Co-evaluation               End of Session   Activity Tolerance: Patient tolerated treatment well;No increased pain Patient left: in bed;with call bell/phone within reach Nurse Communication: Mobility status;Other (comment)    Functional Assessment Tool Used: clinical judgment  Functional Limitation: Mobility: Walking and moving around Mobility: Walking and Moving Around Current Status [phone]): At least 1 percent but less than 20 percent impaired, limited or restricted Mobility: Walking and Moving Around Goal Status [phone]): At least 1 percent but less than 20 percent impaired, limited or restricted Mobility: Walking and Moving Around Discharge Status [phone]): At least 1 percent but less than 20 percent impaired, limited or restricted    Time: 0902-0920 PT Time Calculation (min) (ACUTE ONLY): 18 min   Charges:   PT Evaluation $PT Eval Low Complexity: 1 Procedure     PT G Codes:   PT G-Codes **NOT FOR INPATIENT CLASS** Functional Assessment Tool Used: clinical judgment  Functional Limitation: Mobility: Walking and moving around Mobility: Walking and Moving Around Current Status [id number]): At least 1 percent but less than 20 percent impaired, limited or restricted Mobility: Walking and Moving Around Goal Status [phone]): At least 1 percent but less than 20 percent impaired, limited or restricted Mobility: Walking and Moving Around Discharge Status [phone]): At least 1 percent but less than 20 percent impaired, limited or restricted    Etta Grandchild, PT, DPT PRN Physical Therapist at [facility] License # [id number] (wireless)  [phone] (mobile)

## 2015-12-20 NOTE — Evaluation (Signed)
Occupational Therapy Evaluation Patient Details Name: Jared Bridges MRN: [id number] DOB: 1930-09-28 Today's Date: 12/20/2015    History of Present Illness Jared Bridges is an 82 y.o. male with hx of chronic HA on Depakote, DM2, HTN, HLD, hx of GI bleed not on ASA, PMR on steroids, returned to the ER after being seen with HA a few days ago, with complaint of "diffiulty with words", slurred speech, and HA. He did not have focal weakness, or visual changes. Symptoms waxes and wanes. Work up in the ER included an MRI/MRA of the brain which showed acute infarcts of the left posterior frontal and parietal cortex and no major treatable occlusion. His serology was unremarkable. He was given an ASA and hospitalist was asked to admit him for acute CVA. His EKG showed NSR with no acute ST T changes, and serology was normal with BS of 147. Hospitalist was asked to admit him for further evaluation. Neurology was consulted by [place] physician as well.   Clinical Impression   Pt awake, alert, oriented x4 this am, agreeable to evaluation. Pt demonstrates independence in ADL and functional mobility tasks this session; BUE ROM and strength WNL, coordination and sensation intact. Pt reports he is feeling "much better" and believes his speech has returned to baseline; OT did not note any dysarthria, family not present to confirm baseline speech. Pt does not require any further OT services at this time.     Follow Up Recommendations  No OT follow up    Equipment Recommendations  None recommended by OT       Precautions / Restrictions Precautions Precautions: None Restrictions Weight Bearing Restrictions: No      Mobility Bed Mobility Overal bed mobility: Modified Independent                Transfers Overall transfer level: Independent                         ADL Overall ADL's : Modified independent;At baseline                                              Vision Vision Assessment?: Yes Eye Alignment: Within Functional Limits Ocular Range of Motion: Within Functional Limits Alignment/Gaze Preference: Within Defined Limits Tracking/Visual Pursuits: Able to track stimulus in all quads without difficulty Saccades: Within functional limits Convergence: Within functional limits Visual Fields: No apparent deficits          Pertinent Vitals/Pain Pain Assessment: No/denies pain     Hand Dominance Right   Extremity/Trunk Assessment Upper Extremity Assessment Upper Extremity Assessment: Overall WFL for tasks assessed   Lower Extremity Assessment Lower Extremity Assessment: Defer to PT evaluation       Communication Communication Communication: No difficulties   Cognition Arousal/Alertness: Awake/alert Behavior During Therapy: WFL for tasks assessed/performed Overall Cognitive Status: Within Functional Limits for tasks assessed                                Home Living Family/patient expects to be discharged to:: Private residence Living Arrangements: Spouse/significant other (daughter) Available Help at Discharge: Family;Available 24 hours/day               Bathroom Shower/Tub: Teacher, early years/pre: Handicapped height     Home  Equipment: Cane - quad;Grab bars - toilet;Grab bars - tub/shower;Tub bench;Walker - 2 wheels;Walker - standard;Adaptive equipment Adaptive Equipment: Reacher;Sock aid;Long-handled shoe horn;Long-handled sponge        Prior Functioning/Environment Level of Independence: Independent with assistive device(s)              End of Session    Activity Tolerance: Patient tolerated treatment well Patient left: in bed;with call bell/phone within reach   Time: 0811-0827 OT Time Calculation (min): 16 min Charges:  OT General Charges $OT Visit: 1 Procedure OT Evaluation $OT Eval Low Complexity: 1 Procedure G-Codes: OT G-codes **NOT FOR INPATIENT  CLASS** Functional Assessment Tool Used: clinical judgement Functional Limitation: Self care Self Care Current Status [id number]): At least 1 percent but less than 20 percent impaired, limited or restricted Self Care Goal Status [id number]): At least 1 percent but less than 20 percent impaired, limited or restricted Self Care Discharge Status [phone]): At least 1 percent but less than 20 percent impaired, limited or restricted  Guadelupe Sabin, OTR/L  [phone]  12/20/2015, 9:08 AM

## 2015-12-20 NOTE — Progress Notes (Signed)
Patient was NPO status on admission to floor, passed swallow screen on previous admission 12/17/15, speech evaluations ordered.  Arrived in room, patient had tray beginning to eat, swallow screen performed and patient passed after starting to eat food on tray.  Checked orders and no diet ordered, explained NPO status to patient and family, family reported that patient only had trouble with some dysphagia from previous uvulectomy at [facility].  Will report to oncoming nurse.

## 2015-12-20 NOTE — Progress Notes (Deleted)
IV 22g SL started at 0950 to [facility], first attempt. Patient tolerated well. For ECHO definity purposes.

## 2015-12-20 NOTE — Progress Notes (Signed)
*  PRELIMINARY RESULTS* Echocardiogram 2D Echocardiogram has been performed.  Leavy Cella 12/20/2015, 2:40 PM

## 2015-12-20 NOTE — Care Management Note (Signed)
Case Management Note  Patient Details  Name: Jared Bridges MRN: [id number] Date of Birth: [birth year]  Subjective/Objective:                  Pt is from home, lives with his wife and is ind with ADL's. Pt active with OP PT prior to admission. Pt will resume OP PT services after DC, family instructed to call OP clinic to make next appointment. Pt has no DME needs at this time.   Action/Plan: No CM needs.   Expected Discharge Date:      12/20/2015            Expected Discharge Plan:  Home/Self Care  In-House Referral:  NA  Discharge planning Services  CM Consult  Post Acute Care Choice:  NA Choice offered to:  NA  DME Arranged:    DME Agency:     HH Arranged:    HH Agency:     Status of Service:  Completed, signed off  Medicare Important Message Given:    Date Medicare IM Given:    Medicare IM give by:    Date Additional Medicare IM Given:    Additional Medicare Important Message give by:     If discussed at [place] of Stay Meetings, dates discussed:    Additional Comments:  Sherald Barge, RN 12/20/2015, 3:49 PM

## 2015-12-20 NOTE — Progress Notes (Signed)
Pt discharged home today per Dr. Marin Comment.  Pt's IV sites d/c'd and WDL.  Pt's VSS.  Pt provided with home medication list and discharge instructions.  Verbalized understanding.  Pt left floor via WC in stable condition accompanied by NT.

## 2015-12-20 NOTE — Discharge Summary (Signed)
Physician Discharge Summary  Jared Bridges [id number] DOB: [birth year] DOA: 12/19/2015  PCP: Jared Albee, MD  Admit date: 12/19/2015 Discharge date: 12/20/2015  Time spent: 35 minutes  Recommendations for Outpatient Follow-up:  1. Follow up with Dr Sampson Goon in 2-3 weeks.  2. Follow up with Dr Anastasio Champion in 2 weeks.    Discharge Diagnoses:  Principal Problem:   Acute CVA (cerebrovascular accident) [facility]) Active Problems:   1St degree AV block   Difficulty hearing   Cardiac murmur   Hypercholesterolemia   Diabetes mellitus ([place])   Disease of thyroid gland   Essential (primary) hypertension   CVA (cerebral infarction)   Carotid stenosis   HPI:  Patient was admitted by me on Jan 18, 17 for acute CVA.  As per my prior H and P:  "Jared Bridges is an 82 y.o. male with hx of chronic HA on Depakote, DM2, HTN, HLD, hx of GI bleed not on ASA, PMR on steroids, returned to the ER after being seen with HA a few days ago, with complaint of "diffiulty with words", slurred speech, and HA. He did not have focal weakness, or visual changes. Symptoms waxes and wanes. Work up in the ER included an MRI/MRA of the brain which showed acute infarcts of the left posterior frontal and parietal cortex and no major treatable occlusion. His serology was unremarkable. He was given an ASA and hospitalist was asked to admit him for acute CVA. His EKG showed NSR with no acute ST T changes, and serology was normal with BS of 147. Hospitalist was asked to admit him for further evaluation. Neurology was consulted by EDP as well.  hospital Course: Patient was admitted for stroke work up, and he was started on ASA.  He had an MRA as above, which did not show any correctable stenosis.  He passed his swallow test, and was given cardiac diet.  He had an [name] of his carotids, and it showed less than 50 percent stenosis bilaterally.  He was seen by neurology, and Dr Merlene Laughter felt that he had cardioembolic or artery to  artery.  He agreed with statin.  At the time of discharge, his ECHO was still pending.  He did not require further PT or speech therapy, as he had no deficit.  He is anxious to go home, and is stable for discharge.  Will recommend continuation of his statin and 81mg  ASA daily.  He will see his PCP next week, and to follow up with neurology as soon as possible .  Thanks.    Procedures:  ECHO  Carotid sonogram  MRI/MRA brain.  Consultations:  Neurology.   Discharge Exam: Filed Vitals:   12/20/15 0545 12/20/15 0745  BP: 160/55 158/62  Pulse: 62 59  Temp: 98.4 F (36.9 C) 97.7 F (36.5 C)  Resp: 20 20     Discharge Instructions    Current Discharge Medication List    START taking these medications   Details  atorvastatin (LIPITOR) 40 MG tablet Take 1 tablet (40 mg total) by mouth daily at 6 PM. Qty: 30 tablet, Refills: 1      CONTINUE these medications which have NOT CHANGED   Details  clonazePAM (KLONOPIN) 1 MG tablet Take 1 tablet (1 mg total) by mouth at bedtime. Qty: 30 tablet, Refills: 2    diclofenac sodium (VOLTAREN) 1 % GEL Apply 2 g topically daily as needed (for pain). Qty: 2 g, Refills: 1    divalproex (DEPAKOTE) 250 MG DR tablet Take  1 tablet (250 mg total) by mouth every morning. Qty: 30 tablet, Refills: 5   Associated Diagnoses: Intractable migraine with aura with status migrainosus; Episodic cluster headache, not intractable    HYDROcodone-acetaminophen (NORCO/VICODIN) 5-325 MG per tablet Take 1-2 tablets by mouth every 4 (four) hours as needed for moderate pain. Qty: 80 tablet, Refills: 0    predniSONE (DELTASONE) 5 MG tablet Take 5 mg by mouth every other day. Taking Every Other morning for PMR Arthritis Refills: 1    pregabalin (LYRICA) 25 MG capsule Take 25 mg by mouth at bedtime.    venlafaxine XR (EFFEXOR-XR) 150 MG 24 hr capsule Take 1 capsule (150 mg total) by mouth daily. Qty: 30 capsule, Refills: 2      STOP taking these medications      bacitracin ointment      Cholecalciferol (VITAMIN D3) 5000 UNITS TABS      feeding supplement, GLUCERNA SHAKE, (GLUCERNA SHAKE) LIQD        Allergies  Allergen Reactions  . Iodinated Diagnostic Agents Hives and Itching    Other reaction(s): Other (See Comments) Other Reaction: iv contrast=hives  . Cymbalta [Duloxetine Hcl] Other (See Comments)    Makes patient have jerking motions.  . Iodine Hives    very allergic(per daughter)  . Latex Other (See Comments)    Redness iritation  . Lisinopril Other (See Comments)    Makes patient have jerking motions.  . Neurontin [Gabapentin] Other (See Comments)    Makes patient have jerking motions.  . Tape Other (See Comments)    Skin irritation  . Zoloft [Sertraline Hcl] Other (See Comments) and Diarrhea    Makes patient have jerking motions.      The results of significant diagnostics from this hospitalization (including imaging, microbiology, ancillary and laboratory) are listed below for reference.    Significant Diagnostic Studies: Ct Head Wo Contrast  12/17/2015  CLINICAL DATA:  Dysarthria began 40 minutes ago getting worse EXAM: CT HEAD WITHOUT CONTRAST TECHNIQUE: Contiguous axial images were obtained from the base of the skull through the vertex without intravenous contrast. COMPARISON:  Multiple prior studies FINDINGS: Age-related moderate atrophy. Mild low attenuation in the periventricular white matter bilaterally. No focal attenuation change to suggest mass or vascular territory infarct. No hemorrhage or extra-axial fluid. No hydrocephalus. Calvarium intact. IMPRESSION: Age-related involutional change with no acute findings. Critical Value/emergent results were called by telephone at the time of interpretation on 12/17/2015 at 5:52 pm to Dr. Delora Fuel , who verbally acknowledged these results. Electronically Signed   By: Skipper Cliche M.D.   On: 12/17/2015 17:52   Mr [name] Head Wo Contrast  12/19/2015  CLINICAL DATA:   Weakness and confusion for 3 days. EXAM: MRI HEAD WITHOUT CONTRAST MRA HEAD WITHOUT CONTRAST TECHNIQUE: Multiplanar, multiecho pulse sequences of the brain and surrounding structures were obtained without intravenous contrast. Angiographic images of the head were obtained using MRA technique without contrast. COMPARISON:  10/08/2015 brain MRI.  Head CT from 2 days ago FINDINGS: MRI HEAD FINDINGS Calvarium and upper cervical spine: No focal marrow signal abnormality. Orbits: Right cataract resection.  No acute finding. 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 No acute finding . Brain: Small (1 cm or less) acute infarcts in the left posterior frontal and parietal cortex and subcortical white matter which are clustered. These are in the MCA and posterior border zone territory. No superimposed hemorrhage. Chronic small vessel disease with patchy ischemic gliosis in the pons and in the deep  cerebral white matter. Generalized cerebral volume loss, age congruent MRA HEAD FINDINGS Severely limited due to patient motion. Patent bilateral vertebral arteries with proximal branches showing patency. Patent basilar without indication of stenosis. Apparent outpouching from the left upper basilar is attributed to motion artifact. An aneurysm of this size should be visible on coronal T2 weighted imaging, but this region appears normal on conventional MR. Probable hypoplastic or aplastic left A1 segment. Otherwise the proximal circulation is patent. IMPRESSION: 1. Small clustered acute infarcts in the left posterior frontal and parietal cortex and subcortical white matter, posterior MCA territory. No superimposed hemorrhage. 2. 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 No major vessel/treatable occlusion. Electronically Signed   By: Monte Fantasia M.D.   On: 12/19/2015 11:26   Mr Brain Wo Contrast  12/19/2015  CLINICAL DATA:  Weakness and confusion for 3 days. EXAM: MRI HEAD WITHOUT CONTRAST MRA HEAD  WITHOUT CONTRAST TECHNIQUE: Multiplanar, multiecho pulse sequences of the brain and surrounding structures were obtained without intravenous contrast. Angiographic images of the head were obtained using MRA technique without contrast. COMPARISON:  10/08/2015 brain MRI.  Head CT from 2 days ago FINDINGS: MRI HEAD FINDINGS Calvarium and upper cervical spine: No focal marrow signal abnormality. Orbits: Right cataract resection.  No acute finding. 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 No acute finding . Brain: Small (1 cm or less) acute infarcts in the left posterior frontal and parietal cortex and subcortical white matter which are clustered. These are in the MCA and posterior border zone territory. No superimposed hemorrhage. Chronic small vessel disease with patchy ischemic gliosis in the pons and in the deep cerebral white matter. Generalized cerebral volume loss, age congruent MRA HEAD FINDINGS Severely limited due to patient motion. Patent bilateral vertebral arteries with proximal branches showing patency. Patent basilar without indication of stenosis. Apparent outpouching from the left upper basilar is attributed to motion artifact. An aneurysm of this size should be visible on coronal T2 weighted imaging, but this region appears normal on conventional MR. Probable hypoplastic or aplastic left A1 segment. Otherwise the proximal circulation is patent. IMPRESSION: 1. Small clustered acute infarcts in the left posterior frontal and parietal cortex and subcortical white matter, posterior MCA territory. No superimposed hemorrhage. 2. 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 No major vessel/treatable occlusion. Electronically Signed   By: Monte Fantasia M.D.   On: 12/19/2015 11:26   US Carotid Bilateral  12/20/2015  CLINICAL DATA:  CVA. EXAM: BILATERAL CAROTID DUPLEX ULTRASOUND TECHNIQUE: Pearline Cables scale imaging, color Doppler and duplex ultrasound were performed of bilateral carotid and  vertebral arteries in the neck. COMPARISON:  No prior. FINDINGS: Criteria: Quantification of carotid stenosis is based on velocity parameters that correlate the residual internal carotid diameter with NASCET-based stenosis levels, using the diameter of the distal internal carotid lumen as the denominator for stenosis measurement. The following velocity measurements were obtained: RIGHT ICA:  89/18 cm/sec CCA:  0000000 cm/sec SYSTOLIC ICA/CCA RATIO:  1.7 DIASTOLIC ICA/CCA RATIO:  2.3 ECA:  82 cm/sec LEFT ICA:  70/18 cm/sec CCA:  123XX123 cm/sec SYSTOLIC ICA/CCA RATIO:  1.4 DIASTOLIC ICA/CCA RATIO:  2.5 ECA:  70 cm/sec RIGHT CAROTID ARTERY: Mild plaque right carotid bifurcation. No flow limiting stenosis. RIGHT VERTEBRAL ARTERY:  Patent antegrade flow LEFT CAROTID ARTERY: Mild plaque left carotid bifurcation. No flow limiting stenosis. LEFT VERTEBRAL ARTERY:  Patent antegrade flow. IMPRESSION: 1. Mild plaque both carotid bifurcations. No flow limiting stenosis. Degree of stenosis less than 50%. 2.  Vertebrals are patent antegrade flow. Electronically Signed   By: Marcello Moores  Register   On: 12/20/2015 09:53    Labs: Basic Metabolic Panel:  Recent Labs Lab 12/17/15 1725 12/17/15 1806 12/19/15 1017 12/19/15 1020  NA 138 139 140 139  K 4.1 3.9 4.0 4.0  CL 102 100* 101 103  CO2 28  --   --  28  GLUCOSE 111* 103* 142* 147*  BUN 23* 23* 20 21*  CREATININE 1.03 1.00 1.00 1.11  CALCIUM 9.1  --   --  9.1   Liver Function Tests:  Recent Labs Lab 12/17/15 1725 12/19/15 1020  AST 43* 29  ALT 33 34  ALKPHOS 64 58  BILITOT 0.8 0.8  PROT 7.0 6.8  ALBUMIN 3.8 3.8   CBC:  Recent Labs Lab 12/17/15 1725 12/17/15 1806 12/19/15 1017 12/19/15 1020  WBC 9.8  --   --  8.8  NEUTROABS 6.1  --   --  6.6  HGB 13.8 15.0 14.6 13.2  HCT 42.1 44.0 43.0 40.6  MCV 95.2  --   --  96.4  PLT 227  --   --  227    CBG:  Recent Labs Lab 12/17/15 1723 12/17/15 1838 12/19/15 0953  GLUCAP 102* 90 133*    Signed:  Haja Crego MD. Rosalita Chessman. [facility] 12/20/2015, 1:31 PM

## 2015-12-20 NOTE — Progress Notes (Signed)
SLP Cancellation Note  Patient Details Name: Jared Bridges MRN: [id number] DOB: 08-24-1930   Cancelled treatment:      Pt is currently being d/c was unable to be evaluated. Family reports pt's cognition is at baseline and that he is having no swallowing difficulty. No further ST needs are noted at this time.   Kiwan Gadsden H. Roddie Mc, CCC-SLP Speech Language Pathologist   Wende Bushy 12/20/2015, 4:14 PM

## 2015-12-21 LAB — HOMOCYSTEINE: HOMOCYSTEINE-NORM: 10 umol/L (ref 0.0–15.0)

## 2015-12-21 LAB — HEMOGLOBIN A1C
Hgb A1c MFr Bld: 6.9 % — ABNORMAL HIGH (ref 4.8–5.6)
MEAN PLASMA GLUCOSE: 151 mg/dL

## 2015-12-25 ENCOUNTER — Encounter (HOSPITAL_COMMUNITY): Payer: Self-pay

## 2015-12-27 ENCOUNTER — Encounter (HOSPITAL_COMMUNITY): Payer: Self-pay | Admitting: Physical Therapy

## 2016-01-01 ENCOUNTER — Encounter (HOSPITAL_COMMUNITY): Payer: Self-pay

## 2016-01-03 ENCOUNTER — Encounter (HOSPITAL_COMMUNITY): Payer: Self-pay

## 2016-01-07 ENCOUNTER — Other Ambulatory Visit (HOSPITAL_COMMUNITY): Payer: Self-pay | Admitting: Internal Medicine

## 2016-01-07 ENCOUNTER — Ambulatory Visit (HOSPITAL_COMMUNITY)
Admission: RE | Admit: 2016-01-07 | Discharge: 2016-01-07 | Disposition: A | Discharge status: Home / Self Care | Payer: Medicare Other | Source: Ambulatory Visit | Attending: Internal Medicine | Admitting: Internal Medicine

## 2016-01-07 DIAGNOSIS — J209 Acute bronchitis, unspecified: Secondary | ICD-10-CM

## 2016-01-07 DIAGNOSIS — J40 Bronchitis, not specified as acute or chronic: Secondary | ICD-10-CM | POA: Diagnosis present

## 2016-01-08 ENCOUNTER — Encounter (HOSPITAL_COMMUNITY): Payer: Self-pay | Admitting: Physical Therapy

## 2016-01-10 ENCOUNTER — Encounter (HOSPITAL_COMMUNITY): Payer: Self-pay | Admitting: Physical Therapy

## 2016-01-15 ENCOUNTER — Encounter (HOSPITAL_COMMUNITY): Payer: Self-pay | Admitting: Physical Therapy

## 2016-01-17 ENCOUNTER — Other Ambulatory Visit (HOSPITAL_COMMUNITY): Payer: Self-pay | Admitting: Orthopaedic Surgery

## 2016-01-17 ENCOUNTER — Encounter (HOSPITAL_COMMUNITY): Payer: Self-pay | Admitting: Physical Therapy

## 2016-01-17 DIAGNOSIS — T84038A Mechanical loosening of other internal prosthetic joint, initial encounter: Secondary | ICD-10-CM

## 2016-01-17 DIAGNOSIS — Z96659 Presence of unspecified artificial knee joint: Principal | ICD-10-CM

## 2016-01-18 ENCOUNTER — Encounter (HOSPITAL_COMMUNITY)
Admission: RE | Admit: 2016-01-18 | Discharge: 2016-01-18 | Disposition: A | Discharge status: Home / Self Care | Payer: Medicare Other | Source: Ambulatory Visit | Attending: Orthopaedic Surgery | Admitting: Orthopaedic Surgery

## 2016-01-18 ENCOUNTER — Encounter (HOSPITAL_COMMUNITY): Payer: Self-pay

## 2016-01-18 DIAGNOSIS — Z96659 Presence of unspecified artificial knee joint: Secondary | ICD-10-CM | POA: Diagnosis not present

## 2016-01-18 DIAGNOSIS — T84038A Mechanical loosening of other internal prosthetic joint, initial encounter: Secondary | ICD-10-CM | POA: Diagnosis present

## 2016-01-18 DIAGNOSIS — Y848 Other medical procedures as the cause of abnormal reaction of the patient, or of later complication, without mention of misadventure at the time of the procedure: Secondary | ICD-10-CM | POA: Insufficient documentation

## 2016-01-18 MED ORDER — TECHNETIUM TC 99M MEDRONATE IV KIT
25.0000 | PACK | Freq: Once | INTRAVENOUS | Status: AC | PRN
  Administered 2016-01-18: 26 via INTRAVENOUS

## 2016-01-22 ENCOUNTER — Encounter (HOSPITAL_COMMUNITY): Payer: Self-pay

## 2016-01-24 ENCOUNTER — Encounter (HOSPITAL_COMMUNITY): Payer: Self-pay | Admitting: Physical Therapy

## 2016-01-31 ENCOUNTER — Ambulatory Visit (INDEPENDENT_AMBULATORY_CARE_PROVIDER_SITE_OTHER): Payer: Medicare Other | Admitting: Otolaryngology

## 2016-01-31 DIAGNOSIS — H6983 Other specified disorders of Eustachian tube, bilateral: Secondary | ICD-10-CM | POA: Diagnosis not present

## 2016-02-18 ENCOUNTER — Other Ambulatory Visit: Payer: Self-pay | Admitting: Orthopaedic Surgery

## 2016-02-25 ENCOUNTER — Encounter (HOSPITAL_COMMUNITY)
Admission: RE | Admit: 2016-02-25 | Discharge: 2016-02-25 | Disposition: A | Discharge status: Home / Self Care | Payer: Medicare Other | Source: Ambulatory Visit | Attending: Orthopaedic Surgery | Admitting: Orthopaedic Surgery

## 2016-02-25 ENCOUNTER — Encounter (HOSPITAL_COMMUNITY): Payer: Self-pay

## 2016-02-25 HISTORY — DX: Nontoxic single thyroid nodule: E04.1

## 2016-02-25 HISTORY — DX: Cerebral infarction, unspecified: I63.9

## 2016-02-25 LAB — SURGICAL PCR SCREEN
MRSA, PCR: NEGATIVE
STAPHYLOCOCCUS AUREUS: NEGATIVE

## 2016-02-25 LAB — GLUCOSE, CAPILLARY: GLUCOSE-CAPILLARY: 194 mg/dL — AB (ref 65–99)

## 2016-02-25 LAB — CBC WITH DIFFERENTIAL/PLATELET
BASOS ABS: 0 10*3/uL (ref 0.0–0.1)
BASOS PCT: 0 %
EOS ABS: 0.3 10*3/uL (ref 0.0–0.7)
Eosinophils Relative: 3 %
HEMATOCRIT: 42.1 % (ref 39.0–52.0)
HEMOGLOBIN: 13.6 g/dL (ref 13.0–17.0)
Lymphocytes Relative: 16 %
Lymphs Abs: 1.5 10*3/uL (ref 0.7–4.0)
MCH: 31.7 pg (ref 26.0–34.0)
MCHC: 32.3 g/dL (ref 30.0–36.0)
MCV: 98.1 fL (ref 78.0–100.0)
MONOS PCT: 9 %
Monocytes Absolute: 0.8 10*3/uL (ref 0.1–1.0)
NEUTROS ABS: 7 10*3/uL (ref 1.7–7.7)
NEUTROS PCT: 72 %
Platelets: 214 10*3/uL (ref 150–400)
RBC: 4.29 MIL/uL (ref 4.22–5.81)
RDW: 13.2 % (ref 11.5–15.5)
WBC: 9.6 10*3/uL (ref 4.0–10.5)

## 2016-02-25 LAB — URINALYSIS, ROUTINE W REFLEX MICROSCOPIC
BILIRUBIN URINE: NEGATIVE
Glucose, UA: NEGATIVE mg/dL
HGB URINE DIPSTICK: NEGATIVE
Ketones, ur: NEGATIVE mg/dL
Leukocytes, UA: NEGATIVE
NITRITE: NEGATIVE
PROTEIN: NEGATIVE mg/dL
Specific Gravity, Urine: 1.025 (ref 1.005–1.030)
pH: 6 (ref 5.0–8.0)

## 2016-02-25 LAB — TYPE AND SCREEN
ABO/RH(D): O POS
ANTIBODY SCREEN: NEGATIVE

## 2016-02-25 LAB — COMPREHENSIVE METABOLIC PANEL
ALBUMIN: 3.7 g/dL (ref 3.5–5.0)
ALK PHOS: 78 U/L (ref 38–126)
ALT: 21 U/L (ref 17–63)
ANION GAP: 12 (ref 5–15)
AST: 27 U/L (ref 15–41)
BILIRUBIN TOTAL: 0.5 mg/dL (ref 0.3–1.2)
BUN: 20 mg/dL (ref 6–20)
CALCIUM: 9.2 mg/dL (ref 8.9–10.3)
CO2: 24 mmol/L (ref 22–32)
Chloride: 103 mmol/L (ref 101–111)
Creatinine, Ser: 1.15 mg/dL (ref 0.61–1.24)
GFR calc Af Amer: >60 mL/min (ref >60)
GFR calc non Af Amer: 57 mL/min — ABNORMAL LOW (ref >60)
GLUCOSE: 197 mg/dL — AB (ref 65–99)
Potassium: 4.5 mmol/L (ref 3.5–5.1)
SODIUM: 139 mmol/L (ref 135–145)
Total Protein: 6.5 g/dL (ref 6.5–8.1)

## 2016-02-25 LAB — PROTIME-INR
INR: 1.06 (ref 0.00–1.49)
Prothrombin Time: 14 seconds (ref 11.6–15.2)

## 2016-02-25 LAB — ABO/RH: ABO/RH(D): O POS

## 2016-02-25 LAB — C-REACTIVE PROTEIN: CRP: 0.8 mg/dL (ref <1.0)

## 2016-02-25 LAB — SEDIMENTATION RATE: Sed Rate: 27 mm/hr — ABNORMAL HIGH (ref 0–16)

## 2016-02-25 LAB — APTT: APTT: 32 s (ref 24–37)

## 2016-02-25 NOTE — Pre-Procedure Instructions (Signed)
CHANCELLER SINSEL  02/25/2016      [facility] [address] [place] - [address] [place] [address] Phone: [phone] Fax: [phone]    Your procedure is scheduled on Thursday 02/28/16  Report to [facility] Admitting at 1000 A.M.  Call this number if you have problems the morning of surgery:  [phone]   Remember:  Do not eat food or drink liquids after midnight.  Take these medicines the morning of surgery with A SIP OF WATER  DIVALPROEX(DEPAKOTE), HYDROCODONE, VENLAFAXINE (EFFEXOR)    (STOP DICLOFENAC/ VOLTAREN, ASPIRIN, IBUPROFEN/ ADVIL/ MOTRIN, GOODY POWDERS/ BC'S, HERBAL MEDICINES)   Do not wear jewelry, make-up or nail polish.  Do not wear lotions, powders, or perfumes.  You may wear deodorant.  Do not shave 48 hours prior to surgery.  Men may shave face and neck.  Do not bring valuables to the hospital.  [facility] is not responsible for any belongings or valuables.  Contacts, dentures or bridgework may not be worn into surgery.  Leave your suitcase in the car.  After surgery it may be brought to your room.  For patients admitted to the hospital, discharge time will be determined by your treatment team.  Patients discharged the day of surgery will not be allowed to drive home.   Name and phone number of your driver:   Special instructions:  [facility] - Preparing for Surgery  Before surgery, you can play an important role.  Because skin is not sterile, your skin needs to be as free of germs as possible.  You can reduce the number of germs on you skin by washing with CHG (chlorahexidine gluconate) soap before surgery.  CHG is an antiseptic cleaner which kills germs and bonds with the skin to continue killing germs even after washing.  Please DO NOT use if you have an allergy to CHG or antibacterial soaps.  If your skin becomes reddened/irritated stop using the CHG and inform your nurse when you arrive at Short Stay.  Do not shave  (including legs and underarms) for at least 48 hours prior to the first CHG shower.  You may shave your face.  Please follow these instructions carefully:   1.  Shower with CHG Soap the night before surgery and the                                morning of Surgery.  2.  If you choose to wash your hair, wash your hair first as usual with your       normal shampoo.  3.  After you shampoo, rinse your hair and body thoroughly to remove the                      Shampoo.  4.  Use CHG as you would any other liquid soap.  You can apply chg directly       to the skin and wash gently with scrungie or a clean washcloth.  5.  Apply the CHG Soap to your body ONLY FROM THE NECK DOWN.        Do not use on open wounds or open sores.  Avoid contact with your eyes,       ears, mouth and genitals (private parts).  Wash genitals (private parts)       with your normal soap.  6.  Wash  thoroughly, paying special attention to the area where your surgery        will be performed.  7.  Thoroughly rinse your body with warm water from the neck down.  8.  DO NOT shower/wash with your normal soap after using and rinsing off       the CHG Soap.  9.  Pat yourself dry with a clean towel.            10.  Wear clean pajamas.            11.  Place clean sheets on your bed the night of your first shower and do not        sleep with pets.  Day of Surgery  Do not apply any lotions/deoderants the morning of surgery.  Please wear clean clothes to the hospital/surgery center.    Please read over the following fact sheets that you were given. Pain Booklet, Coughing and Deep Breathing, Blood Transfusion Information, MRSA Information and Surgical Site Infection Prevention

## 2016-02-27 MED ORDER — TRANEXAMIC ACID 1000 MG/10ML IV SOLN
1000.0000 mg | INTRAVENOUS | Status: AC
  Administered 2016-02-28: 1000 mg via INTRAVENOUS
  Filled 2016-02-27: qty 10

## 2016-02-28 ENCOUNTER — Encounter (HOSPITAL_COMMUNITY): Payer: Self-pay | Admitting: *Deleted

## 2016-02-28 ENCOUNTER — Inpatient Hospital Stay (HOSPITAL_COMMUNITY): Payer: Medicare Other

## 2016-02-28 ENCOUNTER — Inpatient Hospital Stay (HOSPITAL_COMMUNITY): Payer: Medicare Other | Admitting: Anesthesiology

## 2016-02-28 ENCOUNTER — Inpatient Hospital Stay (HOSPITAL_COMMUNITY)
Admission: RE | Admit: 2016-02-28 | Discharge: 2016-03-02 | DRG: 468 | Disposition: A | Discharge status: Home Health | Payer: Medicare Other | Source: Ambulatory Visit | Attending: Orthopaedic Surgery | Admitting: Orthopaedic Surgery

## 2016-02-28 ENCOUNTER — Encounter (HOSPITAL_COMMUNITY)
Admission: RE | Disposition: A | Discharge status: Home Health | Payer: Self-pay | Source: Ambulatory Visit | Attending: Orthopaedic Surgery

## 2016-02-28 DIAGNOSIS — Z79899 Other long term (current) drug therapy: Secondary | ICD-10-CM | POA: Diagnosis not present

## 2016-02-28 DIAGNOSIS — T84033A Mechanical loosening of internal left knee prosthetic joint, initial encounter: Principal | ICD-10-CM | POA: Diagnosis present

## 2016-02-28 DIAGNOSIS — E1122 Type 2 diabetes mellitus with diabetic chronic kidney disease: Secondary | ICD-10-CM | POA: Diagnosis present

## 2016-02-28 DIAGNOSIS — N189 Chronic kidney disease, unspecified: Secondary | ICD-10-CM | POA: Diagnosis present

## 2016-02-28 DIAGNOSIS — Z87891 Personal history of nicotine dependence: Secondary | ICD-10-CM

## 2016-02-28 DIAGNOSIS — M353 Polymyalgia rheumatica: Secondary | ICD-10-CM | POA: Diagnosis present

## 2016-02-28 DIAGNOSIS — I129 Hypertensive chronic kidney disease with stage 1 through stage 4 chronic kidney disease, or unspecified chronic kidney disease: Secondary | ICD-10-CM | POA: Diagnosis not present

## 2016-02-28 DIAGNOSIS — Z981 Arthrodesis status: Secondary | ICD-10-CM

## 2016-02-28 DIAGNOSIS — Z96659 Presence of unspecified artificial knee joint: Secondary | ICD-10-CM

## 2016-02-28 DIAGNOSIS — E1140 Type 2 diabetes mellitus with diabetic neuropathy, unspecified: Secondary | ICD-10-CM | POA: Diagnosis not present

## 2016-02-28 DIAGNOSIS — Z7952 Long term (current) use of systemic steroids: Secondary | ICD-10-CM

## 2016-02-28 DIAGNOSIS — Z8673 Personal history of transient ischemic attack (TIA), and cerebral infarction without residual deficits: Secondary | ICD-10-CM | POA: Diagnosis not present

## 2016-02-28 DIAGNOSIS — Z7982 Long term (current) use of aspirin: Secondary | ICD-10-CM | POA: Diagnosis not present

## 2016-02-28 DIAGNOSIS — E7800 Pure hypercholesterolemia, unspecified: Secondary | ICD-10-CM | POA: Diagnosis present

## 2016-02-28 DIAGNOSIS — Y792 Prosthetic and other implants, materials and accessory orthopedic devices associated with adverse incidents: Secondary | ICD-10-CM | POA: Diagnosis present

## 2016-02-28 DIAGNOSIS — T84038A Mechanical loosening of other internal prosthetic joint, initial encounter: Secondary | ICD-10-CM

## 2016-02-28 DIAGNOSIS — F329 Major depressive disorder, single episode, unspecified: Secondary | ICD-10-CM | POA: Diagnosis present

## 2016-02-28 HISTORY — PX: TOTAL KNEE REVISION: SHX996

## 2016-02-28 HISTORY — PX: JOINT REPLACEMENT: SHX530

## 2016-02-28 LAB — GLUCOSE, CAPILLARY
GLUCOSE-CAPILLARY: 121 mg/dL — AB (ref 65–99)
Glucose-Capillary: 115 mg/dL — ABNORMAL HIGH (ref 65–99)
Glucose-Capillary: 106 mg/dL — ABNORMAL HIGH (ref 65–99)
Glucose-Capillary: 143 mg/dL — ABNORMAL HIGH (ref 65–99)

## 2016-02-28 SURGERY — TOTAL KNEE REVISION
Anesthesia: Monitor Anesthesia Care | Site: Knee | Laterality: Left

## 2016-02-28 MED ORDER — TRAMADOL HCL 50 MG PO TABS: 50.0000 mg | ORAL_TABLET | Freq: Four times a day (QID) | ORAL | Status: DC | PRN

## 2016-02-28 MED ORDER — ACETAMINOPHEN 325 MG PO TABS: 650.0000 mg | ORAL_TABLET | Freq: Four times a day (QID) | ORAL | Status: DC | PRN

## 2016-02-28 MED ORDER — PROPOFOL 10 MG/ML IV BOLUS
INTRAVENOUS | Status: AC
  Filled 2016-02-28: qty 40

## 2016-02-28 MED ORDER — ONDANSETRON HCL 4 MG/2ML IJ SOLN: 4.0000 mg | Freq: Four times a day (QID) | INTRAMUSCULAR | Status: DC | PRN

## 2016-02-28 MED ORDER — ONDANSETRON HCL 4 MG PO TABS: 4.0000 mg | ORAL_TABLET | Freq: Four times a day (QID) | ORAL | Status: DC | PRN

## 2016-02-28 MED ORDER — METOCLOPRAMIDE HCL 5 MG/ML IJ SOLN: 10.0000 mg | Freq: Once | INTRAMUSCULAR | Status: DC | PRN

## 2016-02-28 MED ORDER — BUPIVACAINE LIPOSOME 1.3 % IJ SUSP
INTRAMUSCULAR | Status: DC | PRN
  Administered 2016-02-28: 20 mL

## 2016-02-28 MED ORDER — FENTANYL CITRATE (PF) 100 MCG/2ML IJ SOLN
INTRAMUSCULAR | Status: AC
  Administered 2016-02-28: 25 ug via INTRAVENOUS
  Filled 2016-02-28: qty 2

## 2016-02-28 MED ORDER — PREDNISONE 5 MG PO TABS
5.0000 mg | ORAL_TABLET | ORAL | Status: DC
  Administered 2016-02-28: 5 mg via ORAL
  Filled 2016-02-28: qty 1

## 2016-02-28 MED ORDER — DIVALPROEX SODIUM 250 MG PO DR TAB
250.0000 mg | DELAYED_RELEASE_TABLET | Freq: Every morning | ORAL | Status: DC
  Administered 2016-02-29 – 2016-03-02 (×3): 250 mg via ORAL
  Filled 2016-02-28 (×3): qty 1

## 2016-02-28 MED ORDER — VITAMIN D 1000 UNITS PO TABS
5000.0000 [IU] | ORAL_TABLET | Freq: Every day | ORAL | Status: DC
  Administered 2016-02-28 – 2016-03-01 (×3): 5000 [IU] via ORAL
  Filled 2016-02-28 (×3): qty 5

## 2016-02-28 MED ORDER — CEFAZOLIN SODIUM-DEXTROSE 2-4 GM/100ML-% IV SOLN
2.0000 g | Freq: Four times a day (QID) | INTRAVENOUS | Status: AC
  Administered 2016-02-28 – 2016-02-29 (×2): 2 g via INTRAVENOUS
  Filled 2016-02-28 (×2): qty 100

## 2016-02-28 MED ORDER — OXYCODONE-ACETAMINOPHEN 5-325 MG PO TABS
ORAL_TABLET | ORAL | Status: AC
  Administered 2016-02-28: 2 via ORAL
  Filled 2016-02-28: qty 2

## 2016-02-28 MED ORDER — MIDAZOLAM HCL 2 MG/2ML IJ SOLN
INTRAMUSCULAR | Status: AC
  Filled 2016-02-28: qty 2

## 2016-02-28 MED ORDER — PHENYLEPHRINE HCL 10 MG/ML IJ SOLN
INTRAMUSCULAR | Status: DC | PRN
  Administered 2016-02-28 (×2): 80 ug via INTRAVENOUS

## 2016-02-28 MED ORDER — OXYCODONE-ACETAMINOPHEN 5-325 MG PO TABS
2.0000 | ORAL_TABLET | Freq: Once | ORAL | Status: AC
  Administered 2016-02-28: 2 via ORAL

## 2016-02-28 MED ORDER — ASPIRIN EC 325 MG PO TBEC: 325.0000 mg | DELAYED_RELEASE_TABLET | Freq: Two times a day (BID) | ORAL | Status: DC

## 2016-02-28 MED ORDER — SODIUM CHLORIDE 0.9 % IR SOLN
Status: DC | PRN
  Administered 2016-02-28: 3000 mL

## 2016-02-28 MED ORDER — LACTATED RINGERS IV SOLN
INTRAVENOUS | Status: DC
  Administered 2016-02-28 (×3): via INTRAVENOUS

## 2016-02-28 MED ORDER — DIPHENHYDRAMINE HCL 12.5 MG/5ML PO ELIX: 25.0000 mg | ORAL_SOLUTION | ORAL | Status: DC | PRN

## 2016-02-28 MED ORDER — VANCOMYCIN HCL 1000 MG IV SOLR
INTRAVENOUS | Status: DC | PRN
  Administered 2016-02-28: 1000 mg

## 2016-02-28 MED ORDER — ALUM & MAG HYDROXIDE-SIMETH 200-200-20 MG/5ML PO SUSP: 30.0000 mL | ORAL | Status: DC | PRN

## 2016-02-28 MED ORDER — VANCOMYCIN HCL 500 MG IV SOLR
INTRAVENOUS | Status: AC
  Filled 2016-02-28: qty 500

## 2016-02-28 MED ORDER — TRANEXAMIC ACID 1000 MG/10ML IV SOLN
1000.0000 mg | Freq: Once | INTRAVENOUS | Status: AC
  Administered 2016-02-28: 1000 mg via INTRAVENOUS
  Filled 2016-02-28: qty 10

## 2016-02-28 MED ORDER — CHLORHEXIDINE GLUCONATE 4 % EX LIQD: 60.0000 mL | Freq: Once | CUTANEOUS | Status: DC

## 2016-02-28 MED ORDER — SORBITOL 70 % SOLN: 30.0000 mL | Freq: Every day | Status: DC | PRN

## 2016-02-28 MED ORDER — METOCLOPRAMIDE HCL 5 MG/ML IJ SOLN: 5.0000 mg | Freq: Three times a day (TID) | INTRAMUSCULAR | Status: DC | PRN

## 2016-02-28 MED ORDER — ACETAMINOPHEN 650 MG RE SUPP: 650.0000 mg | Freq: Four times a day (QID) | RECTAL | Status: DC | PRN

## 2016-02-28 MED ORDER — PHENYLEPHRINE HCL 10 MG/ML IJ SOLN
10.0000 mg | INTRAVENOUS | Status: DC | PRN
  Administered 2016-02-28: 20 ug/min via INTRAVENOUS

## 2016-02-28 MED ORDER — KETOROLAC TROMETHAMINE 30 MG/ML IJ SOLN
INTRAMUSCULAR | Status: AC
  Filled 2016-02-28: qty 1

## 2016-02-28 MED ORDER — MAGNESIUM CITRATE PO SOLN: 1.0000 | Freq: Once | ORAL | Status: DC | PRN

## 2016-02-28 MED ORDER — ONDANSETRON HCL 4 MG/2ML IJ SOLN
INTRAMUSCULAR | Status: DC | PRN
  Administered 2016-02-28: 4 mg via INTRAVENOUS

## 2016-02-28 MED ORDER — TRANEXAMIC ACID 1000 MG/10ML IV SOLN
2000.0000 mg | INTRAVENOUS | Status: DC | PRN
  Administered 2016-02-28: 2000 mg via INTRAVENOUS

## 2016-02-28 MED ORDER — FLUTICASONE PROPIONATE 50 MCG/ACT NA SUSP: 1.0000 | Freq: Two times a day (BID) | NASAL | Status: DC | PRN

## 2016-02-28 MED ORDER — POLYETHYLENE GLYCOL 3350 17 G PO PACK: 17.0000 g | PACK | Freq: Every day | ORAL | Status: DC | PRN

## 2016-02-28 MED ORDER — PROPOFOL 10 MG/ML IV BOLUS
INTRAVENOUS | Status: DC | PRN
  Administered 2016-02-28: 20 mg via INTRAVENOUS

## 2016-02-28 MED ORDER — FENTANYL CITRATE (PF) 100 MCG/2ML IJ SOLN
INTRAMUSCULAR | Status: AC
  Filled 2016-02-28: qty 2

## 2016-02-28 MED ORDER — PREGABALIN 25 MG PO CAPS
25.0000 mg | ORAL_CAPSULE | Freq: Every day | ORAL | Status: DC
  Administered 2016-02-28 – 2016-03-01 (×3): 25 mg via ORAL
  Filled 2016-02-28 (×3): qty 1

## 2016-02-28 MED ORDER — VENLAFAXINE HCL ER 150 MG PO CP24
150.0000 mg | ORAL_CAPSULE | Freq: Every day | ORAL | Status: DC
  Administered 2016-02-29 – 2016-03-02 (×3): 150 mg via ORAL
  Filled 2016-02-28 (×3): qty 1

## 2016-02-28 MED ORDER — MENTHOL 3 MG MT LOZG: 1.0000 | LOZENGE | OROMUCOSAL | Status: DC | PRN

## 2016-02-28 MED ORDER — VANCOMYCIN HCL 1000 MG IV SOLR
INTRAVENOUS | Status: AC
  Filled 2016-02-28: qty 1000

## 2016-02-28 MED ORDER — ONDANSETRON HCL 4 MG PO TABS: 4.0000 mg | ORAL_TABLET | Freq: Three times a day (TID) | ORAL | Status: DC | PRN

## 2016-02-28 MED ORDER — SENNOSIDES-DOCUSATE SODIUM 8.6-50 MG PO TABS: 1.0000 | ORAL_TABLET | Freq: Every evening | ORAL | Status: DC | PRN

## 2016-02-28 MED ORDER — BUPIVACAINE IN DEXTROSE 0.75-8.25 % IT SOLN
INTRATHECAL | Status: DC | PRN
  Administered 2016-02-28: 2 mL via INTRATHECAL

## 2016-02-28 MED ORDER — LIDOCAINE HCL (CARDIAC) 20 MG/ML IV SOLN
INTRAVENOUS | Status: AC
  Filled 2016-02-28: qty 5

## 2016-02-28 MED ORDER — OXYCODONE HCL 5 MG PO TABS
5.0000 mg | ORAL_TABLET | ORAL | Status: DC | PRN
  Administered 2016-02-28 – 2016-02-29 (×2): 10 mg via ORAL
  Filled 2016-02-28 (×2): qty 2

## 2016-02-28 MED ORDER — DEXAMETHASONE SODIUM PHOSPHATE 10 MG/ML IJ SOLN
10.0000 mg | Freq: Once | INTRAMUSCULAR | Status: AC
  Administered 2016-02-29: 10 mg via INTRAVENOUS
  Filled 2016-02-28: qty 1

## 2016-02-28 MED ORDER — METHOCARBAMOL 750 MG PO TABS: 750.0000 mg | ORAL_TABLET | Freq: Two times a day (BID) | ORAL | Status: DC | PRN

## 2016-02-28 MED ORDER — ATORVASTATIN CALCIUM 40 MG PO TABS
40.0000 mg | ORAL_TABLET | Freq: Every day | ORAL | Status: DC
  Administered 2016-02-28 – 2016-03-01 (×3): 40 mg via ORAL
  Filled 2016-02-28 (×3): qty 1

## 2016-02-28 MED ORDER — FENTANYL CITRATE (PF) 250 MCG/5ML IJ SOLN
INTRAMUSCULAR | Status: AC
  Filled 2016-02-28: qty 5

## 2016-02-28 MED ORDER — PROPOFOL 1000 MG/100ML IV EMUL
INTRAVENOUS | Status: AC
  Filled 2016-02-28: qty 100

## 2016-02-28 MED ORDER — ACETAMINOPHEN 500 MG PO TABS
1000.0000 mg | ORAL_TABLET | Freq: Four times a day (QID) | ORAL | Status: AC
  Administered 2016-02-28 – 2016-02-29 (×3): 1000 mg via ORAL
  Filled 2016-02-28 (×4): qty 2

## 2016-02-28 MED ORDER — OXYCODONE HCL ER 10 MG PO T12A: 10.0000 mg | EXTENDED_RELEASE_TABLET | Freq: Two times a day (BID) | ORAL | Status: DC

## 2016-02-28 MED ORDER — METOCLOPRAMIDE HCL 5 MG PO TABS: 5.0000 mg | ORAL_TABLET | Freq: Three times a day (TID) | ORAL | Status: DC | PRN

## 2016-02-28 MED ORDER — PHENOL 1.4 % MT LIQD: 1.0000 | OROMUCOSAL | Status: DC | PRN

## 2016-02-28 MED ORDER — MORPHINE SULFATE (PF) 2 MG/ML IV SOLN
1.0000 mg | INTRAVENOUS | Status: DC | PRN
  Administered 2016-02-28: 1 mg via INTRAVENOUS

## 2016-02-28 MED ORDER — PROPOFOL 500 MG/50ML IV EMUL
INTRAVENOUS | Status: DC | PRN
  Administered 2016-02-28: 50 ug/kg/min via INTRAVENOUS

## 2016-02-28 MED ORDER — METHOCARBAMOL 500 MG PO TABS
500.0000 mg | ORAL_TABLET | Freq: Four times a day (QID) | ORAL | Status: DC | PRN
  Administered 2016-02-28: 500 mg via ORAL

## 2016-02-28 MED ORDER — OXYCODONE HCL 5 MG PO TABS: 5.0000 mg | ORAL_TABLET | ORAL | Status: DC | PRN

## 2016-02-28 MED ORDER — MORPHINE SULFATE (PF) 2 MG/ML IV SOLN
INTRAVENOUS | Status: AC
  Filled 2016-02-28: qty 1

## 2016-02-28 MED ORDER — BUPIVACAINE LIPOSOME 1.3 % IJ SUSP
20.0000 mL | INTRAMUSCULAR | Status: DC
  Filled 2016-02-28: qty 20

## 2016-02-28 MED ORDER — 0.9 % SODIUM CHLORIDE (POUR BTL) OPTIME
TOPICAL | Status: DC | PRN
  Administered 2016-02-28: 1000 mL

## 2016-02-28 MED ORDER — CLONAZEPAM 1 MG PO TABS
1.0000 mg | ORAL_TABLET | Freq: Every day | ORAL | Status: DC
  Administered 2016-02-28 – 2016-03-01 (×3): 1 mg via ORAL
  Filled 2016-02-28 (×3): qty 1

## 2016-02-28 MED ORDER — METHOCARBAMOL 1000 MG/10ML IJ SOLN
500.0000 mg | Freq: Four times a day (QID) | INTRAVENOUS | Status: DC | PRN
  Filled 2016-02-28: qty 5

## 2016-02-28 MED ORDER — GENTAMICIN SULFATE 40 MG/ML IJ SOLN
INTRAMUSCULAR | Status: DC | PRN
  Administered 2016-02-28: 120 mg via INTRAMUSCULAR

## 2016-02-28 MED ORDER — METHOCARBAMOL 500 MG PO TABS
ORAL_TABLET | ORAL | Status: AC
  Filled 2016-02-28: qty 1

## 2016-02-28 MED ORDER — KETOROLAC TROMETHAMINE 30 MG/ML IJ SOLN
30.0000 mg | Freq: Four times a day (QID) | INTRAMUSCULAR | Status: AC | PRN
  Administered 2016-02-28: 30 mg via INTRAVENOUS

## 2016-02-28 MED ORDER — OXYCODONE HCL ER 10 MG PO T12A
10.0000 mg | EXTENDED_RELEASE_TABLET | Freq: Two times a day (BID) | ORAL | Status: DC
  Administered 2016-02-28 – 2016-03-02 (×6): 10 mg via ORAL
  Filled 2016-02-28 (×6): qty 1

## 2016-02-28 MED ORDER — ESCITALOPRAM OXALATE 5 MG PO TABS
5.0000 mg | ORAL_TABLET | Freq: Every day | ORAL | Status: DC
  Administered 2016-02-28 – 2016-03-01 (×3): 5 mg via ORAL
  Filled 2016-02-28 (×5): qty 1

## 2016-02-28 MED ORDER — SODIUM CHLORIDE 0.9 % IV SOLN
2000.0000 mg | INTRAVENOUS | Status: DC
  Filled 2016-02-28: qty 20

## 2016-02-28 MED ORDER — SODIUM CHLORIDE 0.9 % IV SOLN
INTRAVENOUS | Status: DC
  Administered 2016-02-28 – 2016-02-29 (×2): via INTRAVENOUS

## 2016-02-28 MED ORDER — FENTANYL CITRATE (PF) 100 MCG/2ML IJ SOLN
25.0000 ug | INTRAMUSCULAR | Status: DC | PRN
  Administered 2016-02-28: 50 ug via INTRAVENOUS
  Administered 2016-02-28 (×2): 25 ug via INTRAVENOUS
  Administered 2016-02-28: 50 ug via INTRAVENOUS

## 2016-02-28 MED ORDER — CEFAZOLIN SODIUM-DEXTROSE 2-3 GM-% IV SOLR
INTRAVENOUS | Status: DC | PRN
  Administered 2016-02-28: 2 g via INTRAVENOUS

## 2016-02-28 MED ORDER — GENTAMICIN SULFATE 40 MG/ML IJ SOLN
INTRAMUSCULAR | Status: AC
  Filled 2016-02-28: qty 4

## 2016-02-28 MED ORDER — SUCCINYLCHOLINE CHLORIDE 20 MG/ML IJ SOLN
INTRAMUSCULAR | Status: AC
  Filled 2016-02-28: qty 1

## 2016-02-28 MED ORDER — SODIUM CHLORIDE 0.9 % IJ SOLN
INTRAMUSCULAR | Status: AC
  Filled 2016-02-28: qty 20

## 2016-02-28 MED ORDER — PROPOFOL 500 MG/50ML IV EMUL
INTRAVENOUS | Status: AC
  Filled 2016-02-28: qty 100

## 2016-02-28 MED ORDER — ASPIRIN EC 325 MG PO TBEC
325.0000 mg | DELAYED_RELEASE_TABLET | Freq: Two times a day (BID) | ORAL | Status: DC
  Administered 2016-02-28 – 2016-03-02 (×7): 325 mg via ORAL
  Filled 2016-02-28 (×7): qty 1

## 2016-02-28 MED ORDER — SODIUM CHLORIDE 0.9 % IJ SOLN
INTRAMUSCULAR | Status: DC | PRN
  Administered 2016-02-28: 40 mL via INTRAVENOUS

## 2016-02-28 MED ORDER — IBUPROFEN 400 MG PO TABS
400.0000 mg | ORAL_TABLET | Freq: Three times a day (TID) | ORAL | Status: DC
  Administered 2016-02-28 – 2016-03-02 (×9): 400 mg via ORAL
  Filled 2016-02-28: qty 1
  Filled 2016-02-28: qty 2
  Filled 2016-02-28: qty 1
  Filled 2016-02-28: qty 2
  Filled 2016-02-28 (×2): qty 1
  Filled 2016-02-28 (×2): qty 2
  Filled 2016-02-28 (×2): qty 1
  Filled 2016-02-28: qty 2
  Filled 2016-02-28 (×4): qty 1
  Filled 2016-02-28 (×2): qty 2
  Filled 2016-02-28 (×3): qty 1
  Filled 2016-02-28: qty 2

## 2016-02-28 MED ORDER — OXYCODONE HCL 5 MG/5ML PO SOLN
ORAL | Status: AC
  Administered 2016-02-28: 10 mg
  Filled 2016-02-28: qty 10

## 2016-02-28 SURGICAL SUPPLY — 76 items
BANDAGE ELASTIC 6 VELCRO ST LF (GAUZE/BANDAGES/DRESSINGS) ×3 IMPLANT
BANDAGE ESMARK 6X9 LF (GAUZE/BANDAGES/DRESSINGS) ×1 IMPLANT
BASEPLATE TIBIAL LEGION SZ6 LT (Plate) ×2 IMPLANT
BLADE CHISEL KNEE EXTRACTION (BLADE) ×2 IMPLANT
BLADE SAG 18X100X1.27 (BLADE) ×3 IMPLANT
BLADE SAGITTAL 25.0X1.27X90 (BLADE) ×2 IMPLANT
BLADE SAGITTAL 25.0X1.27X90MM (BLADE) ×1
BLADE SURG ROTATE 9660 (MISCELLANEOUS) IMPLANT
BNDG ESMARK 6X9 LF (GAUZE/BANDAGES/DRESSINGS) ×3
BONE CEMENT PALACOSE (Orthopedic Implant) ×6 IMPLANT
BOWL SMART MIX CTS (DISPOSABLE) IMPLANT
CATH FOLEY LATEX FREE 20FR (CATHETERS) ×2
CATH FOLEY LF 20FR (CATHETERS) ×1 IMPLANT
CEMENT BONE PALACOSE (Orthopedic Implant) ×2 IMPLANT
CHLORAPREP W/TINT 26ML (MISCELLANEOUS) ×9 IMPLANT
COUPLER OFFSET 4MM (INSTRUMENTS) ×2 IMPLANT
COVER BACK TABLE 24X17X13 BIG (DRAPES) IMPLANT
COVER SURGICAL LIGHT HANDLE (MISCELLANEOUS) ×3 IMPLANT
CUFF TOURNIQUET SINGLE 34IN LL (TOURNIQUET CUFF) ×3 IMPLANT
DRAPE IMP U-DRAPE 54X76 (DRAPES) ×3 IMPLANT
DRAPE ORTHO SPLIT 77X108 STRL (DRAPES) ×4
DRAPE SURG ORHT 6 SPLT 77X108 (DRAPES) ×2 IMPLANT
DRAPE U-SHAPE 47X51 STRL (DRAPES) ×3 IMPLANT
DRSG PAD ABDOMINAL 8X10 ST (GAUZE/BANDAGES/DRESSINGS) ×6 IMPLANT
ELECT CAUTERY BLADE 6.4 (BLADE) ×3 IMPLANT
ELECT REM PT RETURN 9FT ADLT (ELECTROSURGICAL) ×3
ELECTRODE REM PT RTRN 9FT ADLT (ELECTROSURGICAL) ×1 IMPLANT
EVACUATOR 1/8 PVC DRAIN (DRAIN) IMPLANT
FACESHIELD STD STERILE (MASK) ×6 IMPLANT
FEMUR OXINIUM SZ 6 LT (Femur) ×3 IMPLANT
GAUZE SPONGE 4X4 12PLY STRL (GAUZE/BANDAGES/DRESSINGS) ×3 IMPLANT
GAUZE XEROFORM 1X8 LF (GAUZE/BANDAGES/DRESSINGS) ×3 IMPLANT
GLOVE BIOGEL PI IND STRL 7.0 (GLOVE) ×2 IMPLANT
GLOVE BIOGEL PI INDICATOR 7.0 (GLOVE) ×4
GLOVE INDICATOR 6.5 STRL GRN (GLOVE) ×3 IMPLANT
GLOVE SKINSENSE NS SZ7.5 (GLOVE) ×4
GLOVE SKINSENSE STRL SZ7.5 (GLOVE) ×2 IMPLANT
GLOVE SURG SIGNA 7.5 PF LTX (GLOVE) ×9 IMPLANT
GLOVE SURG SS PI 6.5 STRL IVOR (GLOVE) ×24 IMPLANT
GOWN STRL REIN XL XLG (GOWN DISPOSABLE) ×6 IMPLANT
HANDPIECE INTERPULSE COAX TIP (DISPOSABLE) ×2
INSERT XLPE 15MM SZ 5-6 (Knees) ×3 IMPLANT
KIT BASIN OR (CUSTOM PROCEDURE TRAY) ×3 IMPLANT
KIT ROOM TURNOVER OR (KITS) ×3 IMPLANT
KIT STIMULAN RAPID CURE 5CC (Orthopedic Implant) ×3 IMPLANT
MANIFOLD NEPTUNE II (INSTRUMENTS) ×3 IMPLANT
NS IRRIG 1000ML POUR BTL (IV SOLUTION) ×3 IMPLANT
OFFSET COUPLER ×3 IMPLANT
PACK TOTAL JOINT (CUSTOM PROCEDURE TRAY) ×3 IMPLANT
PACK UNIVERSAL I (CUSTOM PROCEDURE TRAY) ×3 IMPLANT
PAD ARMBOARD 7.5X6 YLW CONV (MISCELLANEOUS) ×6 IMPLANT
PADDING CAST COTTON 6X4 STRL (CAST SUPPLIES) ×3 IMPLANT
RASP HELIOCORDIAL MED (MISCELLANEOUS) IMPLANT
SET HNDPC FAN SPRY TIP SCT (DISPOSABLE) ×1 IMPLANT
SET PAD KNEE POSITIONER (MISCELLANEOUS) IMPLANT
STAPLER VISISTAT 35W (STAPLE) ×3 IMPLANT
STEM PRESSFIT STR 15X20 KNEE (Stem) ×3 IMPLANT
STEM PRESSFIT STRT 14X160MM (Stem) ×3 IMPLANT
SUCTION FRAZIER HANDLE 10FR (MISCELLANEOUS) ×2
SUCTION TUBE FRAZIER 10FR DISP (MISCELLANEOUS) ×1 IMPLANT
SUT PDS AB 1 CT  36 (SUTURE) ×4
SUT PDS AB 1 CT 36 (SUTURE) ×2 IMPLANT
SUT VIC AB 0 CT1 27 (SUTURE) ×4
SUT VIC AB 0 CT1 27XBRD ANBCTR (SUTURE) ×2 IMPLANT
SUT VIC AB 2-0 CT1 27 (SUTURE) ×4
SUT VIC AB 2-0 CT1 TAPERPNT 27 (SUTURE) ×2 IMPLANT
SUT VLOC 180 0 24IN GS25 (SUTURE) ×3 IMPLANT
SWAB COLLECTION DEVICE MRSA (MISCELLANEOUS) IMPLANT
SYR 20CC LL (SYRINGE) ×3 IMPLANT
SYR 30ML LL (SYRINGE) ×3 IMPLANT
TOWEL OR 17X24 6PK STRL BLUE (TOWEL DISPOSABLE) ×3 IMPLANT
TOWEL OR 17X26 10 PK STRL BLUE (TOWEL DISPOSABLE) ×3 IMPLANT
TRAY FOLEY CATH 16FRSI W/METER (SET/KITS/TRAYS/PACK) IMPLANT
TUBE ANAEROBIC SPECIMEN COL (MISCELLANEOUS) ×3 IMPLANT
WATER STERILE IRR 1000ML POUR (IV SOLUTION) ×9 IMPLANT
WRAP KNEE MAXI GEL POST OP (GAUZE/BANDAGES/DRESSINGS) ×3 IMPLANT

## 2016-02-28 NOTE — H&P (Signed)
PREOPERATIVE H&P  Chief Complaint: left total knee arthroplasty loosening  HPI: Jared Bridges is a 82 y.o. male who presents for surgical treatment of left total knee arthroplasty loosening.  He denies any changes in medical history.  Past Medical History  Diagnosis Date  . Diverticulitis   . Syncope   . Hypercholesteremia   . GI bleed     Dr. Laural Golden - 1998  . Peptic ulcer disease   . Anemia, iron deficiency   . Depression   . Peripheral neuropathy ([place])   . Polymyalgia rheumatica ([place])   . Essential hypertension     UNDER CONTROL   . Heart murmur   . Myocardial infarction [facility])     ???? IN COLLEGE   NOTHING FOUND ON CATH (Q WAVE SHOWS)  . Stroke ([place])     11/2015      TROUBLE READING+ WRITING   . Thyroid nodule   . Type 2 diabetes mellitus ([name]     DIET CONTROLLED   . Chronic kidney disease     RESOLVED  . Chronic headaches     HX  MIGRAINES    Past Surgical History  Procedure Laterality Date  . Uvulectomy  2004    TURBINATE,TONSIL,ADENOID  . Spinal fusion    . Spinal fusion    . Tonsillectomy    . Eye surgery      right cataract with lens implant  . Appendectomy      as child  . Total knee arthroplasty Left 05/28/2015    Procedure: LEFT TOTAL KNEE ARTHROPLASTY;  Surgeon: Gaynelle Arabian, MD;  Location: [facility] ORS;  Service: Orthopedics;  Laterality: Left;  . Irrigation and debridement knee Left 07/09/2015    Procedure: LEFT KNEE IRRIGATION AND DEBRIDEMENT WOUND CLOSURE;  Surgeon: Gaynelle Arabian, MD;  Location: [facility] ORS;  Service: Orthopedics;  Laterality: Left;  . Femur surgery      RT LEG        1953  . Hand surgery      RIGHT  . Hernia repair      1-right inguinal, 3- left inguinal  . Colon surgery      FOR DIVERTICULOSIS   1988  . Trigger finger release      RT HAND   1990S  . Back surgery      2000 IDET SPINAL PROC  . Shoulder open rotator cuff repair      RT SHOULDER  . Cryotherapy    . Meniscus repair      LEFT  . Knee debridement      2016    LEFT   Social History   Social History  . Marital Status: Married    Spouse Name: N/A  . Number of Children: N/A  . Years of Education: N/A   Social History Main Topics  . Smoking status: Former Smoker -- 0.10 packs/day    Types: Cigarettes    Start date: 12/18/1958    Quit date: 12/01/1986  . Smokeless tobacco: Never Used  . Alcohol Use: No  . Drug Use: No  . Sexual Activity: No   Other Topics Concern  . Not on file   Social History Narrative   Family History  Problem Relation Age of Onset  . Hypertension    . Depression Brother   . Depression Daughter   . Colon cancer Sister    Allergies  Allergen Reactions  . Calan [Verapamil] Other (See Comments)    Weakness  . Cymbalta [Duloxetine Hcl] Other (  See Comments)    Makes patient have jerking motions.  . Iodinated Diagnostic Agents Hives and Itching    Can pre-med with benadryl  . Iodine Hives and Itching    very allergic(per daughter), can pre-med with benadryl  . Latex Other (See Comments)    Redness iritation  . Lisinopril Other (See Comments)    Makes patient have jerking motions.  . Neurontin [Gabapentin] Other (See Comments)    Makes patient have jerking motions.  [name] Kitchen Zoloft [Sertraline Hcl] Diarrhea and Other (See Comments)    Makes patient have jerking motions.  . Protonix [Pantoprazole Sodium] Other (See Comments)    "Did not work"  . Valium [Diazepam] Other (See Comments)    "Did not work"  . Flomax [Tamsulosin Hcl] Other (See Comments)    Dizziness  . Glimepiride Other (See Comments)    "Did not work"  . Melatonin Nausea Only  . Tape Other (See Comments)    Skin irritation   Prior to Admission medications   Medication Sig Start Date End Date Taking? Authorizing Provider  aspirin EC 81 MG tablet Take 81 mg by mouth daily.   Yes Historical Provider, MD  atorvastatin (LIPITOR) 40 MG tablet Take 1 tablet (40 mg total) by mouth daily at 6 PM. 12/20/15  Yes Orvan Falconer, MD  Cholecalciferol (VITAMIN D3)  5000 units CAPS Take 5,000 Units by mouth at bedtime.   Yes Historical Provider, MD  clonazePAM (KLONOPIN) 1 MG tablet Take 1 tablet (1 mg total) by mouth at bedtime. Patient taking differently: Take 1 mg by mouth at bedtime as needed for anxiety.  11/22/15 11/21/16 Yes Carmen Dohmeier, MD  diclofenac sodium (VOLTAREN) 1 % GEL Apply 2 g topically daily as needed (for pain). 11/22/15  Yes Asencion Partridge Dohmeier, MD  divalproex (DEPAKOTE) 250 MG DR tablet Take 1 tablet (250 mg total) by mouth every morning. 11/22/15  Yes Carmen Dohmeier, MD  escitalopram (LEXAPRO) 5 MG tablet Take 5 mg by mouth at bedtime. 01/31/16  Yes Historical Provider, MD  fluticasone (FLONASE) 50 MCG/ACT nasal spray Place 1 spray into both nostrils 2 (two) times daily as needed for allergies.  01/07/16  Yes Historical Provider, MD  HYDROcodone-acetaminophen (NORCO/VICODIN) 5-325 MG per tablet Take 1-2 tablets by mouth every 4 (four) hours as needed for moderate pain. Patient taking differently: Take 0.5-1 tablets by mouth every 6 (six) hours as needed for moderate pain. Uses as needed for migraine pain 07/12/15  Yes Arlee Muslim, PA-C  ibuprofen (ADVIL,MOTRIN) 200 MG tablet Take 400 mg by mouth 3 (three) times daily. For osteoarthritis and carpal tunnel   Yes Historical Provider, MD  predniSONE (DELTASONE) 5 MG tablet Take 5 mg by mouth 2 (two) times a week. Tuesday and Thursday 10/03/14  Yes Historical Provider, MD  pregabalin (LYRICA) 25 MG capsule Take 25 mg by mouth at bedtime.   Yes Historical Provider, MD  traMADol (ULTRAM) 50 MG tablet Take 1-2 tablets by mouth every 6 (six) hours as needed for moderate pain.  01/26/16  Yes Historical Provider, MD  venlafaxine XR (EFFEXOR-XR) 150 MG 24 hr capsule Take 1 capsule (150 mg total) by mouth daily. Patient taking differently: Take 75 mg by mouth daily with breakfast.  09/12/15 09/11/16 Yes Cloria Spring, MD     Positive ROS: All other systems have been reviewed and were otherwise negative  with the exception of those mentioned in the HPI and as above.  Physical Exam: General: Alert, no acute distress Cardiovascular: No pedal edema  Respiratory: No cyanosis, no use of accessory musculature GI: abdomen soft Skin: No lesions in the area of chief complaint Neurologic: Sensation intact distally Psychiatric: Patient is competent for consent with normal mood and affect Lymphatic: no lymphedema  MUSCULOSKELETAL: exam stable  Assessment: left total knee arthroplasty loosening  Plan: Plan for Procedure(s): LEFT TOTAL KNEE REVISION  The risks benefits and alternatives were discussed with the patient including but not limited to the risks of nonoperative treatment, versus surgical intervention including infection, bleeding, nerve injury,  blood clots, cardiopulmonary complications, morbidity, mortality, among others, and they were willing to proceed.   Marianna Payment, MD   02/28/2016 7:01 AM

## 2016-02-28 NOTE — Anesthesia Procedure Notes (Signed)
Spinal Patient location during procedure: OR Staffing Anesthesiologist: Addisynn Vassell Performed by: anesthesiologist  Preanesthetic Checklist Completed: patient identified, site marked, surgical consent, pre-op evaluation, timeout performed, IV checked, risks and benefits discussed and monitors and equipment checked Spinal Block Patient position: sitting Prep: ChloraPrep and site prepped and draped Patient monitoring: heart rate, cardiac monitor, continuous pulse ox and blood pressure Approach: midline Location: L3-4 Injection technique: single-shot Needle Needle type: Pencan  Needle gauge: 24 G Needle length: 9 cm Needle insertion depth: 6 cm Assessment Sensory level: T8 Additional Notes Patient tolerated procedure well. Adequate sensory level.

## 2016-02-28 NOTE — Transfer of Care (Signed)
Immediate Anesthesia Transfer of Care Note  Patient: Jared Bridges  Procedure(s) Performed: Procedure(s): LEFT TOTAL KNEE REVISION (Left)  Patient Location: PACU  Anesthesia Type:MAC and Spinal  Level of Consciousness: awake, alert  and oriented  Airway & Oxygen Therapy: Patient Spontanous Breathing and Patient connected to nasal cannula oxygen  Post-op Assessment: Report given to RN and Post -op Vital signs reviewed and stable  Post vital signs: Reviewed and stable  Last Vitals:  Filed Vitals:   02/28/16 0933  BP: 168/74  Pulse: 85  Temp: 36.8 C  Resp: 18    Complications: No apparent anesthesia complications

## 2016-02-28 NOTE — Anesthesia Preprocedure Evaluation (Signed)
Anesthesia Evaluation  Patient identified by MRN, date of birth, ID band Patient awake    Reviewed: Allergy & Precautions, NPO status , Patient's Chart, lab work & pertinent test results  History of Anesthesia Complications Negative for: history of anesthetic complications  Airway Mallampati: II  TM Distance: >3 FB Neck ROM: Full    Dental no notable dental hx. (+) Teeth Intact, Dental Advisory Given   Pulmonary former smoker,    Pulmonary exam normal breath sounds clear to auscultation       Cardiovascular hypertension, Pt. on medications and Pt. on home beta blockers + Past MI and + Peripheral Vascular Disease  Normal cardiovascular exam+ dysrhythmias + Valvular Problems/Murmurs  Rhythm:Regular Rate:Normal     Neuro/Psych  Headaches, PSYCHIATRIC DISORDERS Anxiety Depression Peripheral neuropathy  Neuromuscular disease CVA, Residual Symptoms    GI/Hepatic Neg liver ROS, PUD, Hx/o diverticulitis   Endo/Other  diabetes, Well Controlled, Type 2, Oral Hypoglycemic Agents  Renal/GU Renal diseaseChronic kidney disease     Musculoskeletal  (+) Arthritis , Osteoarthritis,  Polymyalgia rheumatica Loosening of left total knee replacement   Abdominal (+) + obese,   Peds  Hematology  (+) anemia ,   Anesthesia Other Findings   Reproductive/Obstetrics                             Anesthesia Physical  Anesthesia Plan  ASA: III  Anesthesia Plan: Spinal   Post-op Pain Management:    Induction:   Airway Management Planned: Nasal Cannula  Additional Equipment:   Intra-op Plan:   Post-operative Plan:   Informed Consent: I have reviewed the patients History and Physical, chart, labs and discussed the procedure including the risks, benefits and alternatives for the proposed anesthesia with the patient or authorized representative who has indicated his/her understanding and acceptance.   Dental  advisory given  Plan Discussed with: CRNA, Anesthesiologist and Surgeon  Anesthesia Plan Comments:         Anesthesia Quick Evaluation

## 2016-02-28 NOTE — Op Note (Signed)
Date of Surgery: 02/28/2016  INDICATIONS: Mr. Tripplett is a 82 y.o.-year-old male with a left total knee replacement aseptic loosening which was diagnosed with triple phase bone scan and negative infection workup;  The patient did consent to the procedure after discussion of the risks and benefits.  PREOPERATIVE DIAGNOSIS: Left total knee replacement aseptic loosening  POSTOPERATIVE DIAGNOSIS: Same.  PROCEDURE: Left total knee revision of the femoral and tibial components  SURGEON: N. Eduard Roux, M.D.  ASSIST: April Green, RNFA.  ANESTHESIA:  spinal, regional  IV FLUIDS AND URINE: See anesthesia.  ESTIMATED BLOOD LOSS: 100 mL.  IMPLANTS: [employer] Femur: Legion size 6 with 150 mm press-fit stem Tibia: Legion revision baseplate size 6 with [id number] mm press-fit stem Polyethylene liner: 15 mm high flex  DRAINS: One medium Hemovac  COMPLICATIONS: None.  DESCRIPTION OF PROCEDURE: The patient was brought to the operating room and placed supine on the operating table.  The patient had been signed prior to the procedure and this was documented. The patient had the anesthesia placed by the anesthesiologist.  A time-out was performed to confirm that this was the correct patient, site, side and location. The antibiotics were held in anticipation for intraoperative frozen sections. However after careful inspection of the tibia and the femur there was no evidence of infection therefore 1 g of vancomycin was given.  A tourniquet placed.  The patient had the operative extremity prepped and draped in the standard surgical fashion.    The old midline incision was ellipsed out. Full-thickness flaps were elevated both medially and laterally. Medial parapatellar arthrotomy was created. Soft tissue releases were performed in order to allow for adequate exposure. The polyethylene liner was removed. Retractors were then placed. A quarter inch osteotome was used to remove the femur and the tibia with minimal  bone loss. The femur appeared to be well bonded. However the tibia exhibited a gross loosening. There is no signs of infection. 1 g of vancomycin was given.  We then turned our attention to preparing the tibia. A 160 mm press-fit stem was used. We sequentially reamed up to a size 14 mm giving adequate chatter. We then made a 1 mm skin cut to freshen up the tibial surface. Additional soft tissue releases were done to achieve appropriate exposure. We then turned our attention to the femur. We reamed the femoral canal until adequate chatter at 15 mm. We then also made a 1 mm skin cut of the distal femur and the chamfer cuts and the posterior femoral condylar cuts. Excess cement was removed. We then set the appropriate rotation of the tibia after taking it through a range of motion. The tibia was prepared. We then prepared the rest of the femur. The flexion and extension gaps were then balanced. The trial components were taken out. The wound was thoroughly lavaged with pulse irrigation and dilute ex peripheral solution was infiltrated throughout the joint capsule and the soft tissues. The bony surfaces were then dried for final components. The final components were then cemented in. After taking the knee through range of motion a 15 mm polyethylene liner was found to be appropriate. The tourniquet was deflated. Hemostasis was achieved. 10 mL of vancomycin and gentamicin impregnated stimulant in beads were placed in the joint. A medium Hemovac was also placed in the joint. 2 g of topical Tranexamic acid was used. The arthrotomy was closed with #1 Vicryl. Subcutaneous layer was closed with 2-0 Vicryl. Skin was closed with 2-0 nylon. Sterile  dressings were applied. Patient tolerated the procedure well and no immediate complications.  POSTOPERATIVE PLAN: The patient will be weightbearing as tolerated to the left lower extremity. He will need placement in a skilled nursing facility.  Azucena Cecil, MD [place] 3:43 PM

## 2016-02-28 NOTE — Discharge Instructions (Signed)
INSTRUCTIONS AFTER JOINT REPLACEMENT   o Remove items at home which could result in a fall. This includes throw rugs or furniture in walking pathways o ICE to the affected joint every three hours while awake for 30 minutes at a time, for at least the first 3-5 days, and then as needed for pain and swelling.  Continue to use ice for pain and swelling. You may notice swelling that will progress down to the foot and ankle.  This is normal after surgery.  Elevate your leg when you are not up walking on it.   o Continue to use the breathing machine you got in the hospital (incentive spirometer) which will help keep your temperature down.  It is common for your temperature to cycle up and down following surgery, especially at night when you are not up moving around and exerting yourself.  The breathing machine keeps your lungs expanded and your temperature down.   DIET:  As you were doing prior to hospitalization, we recommend a well-balanced diet.  DRESSING / WOUND CARE / SHOWERING  You may change your surgical dressing 3-5 days after surgery.  Then change the dressing every day with sterile gauze.  Please use good hand washing techniques before changing the dressing.  Do not use any lotions or creams on the incision until instructed by your surgeon.  After removal of surgical dressing, you must cover the incision when showering.  ACTIVITY  o Increase activity slowly as tolerated, but follow the weight bearing instructions below.   o No driving for 6 weeks or until further direction given by your physician.  You cannot drive while taking narcotics.  o No lifting or carrying greater than 10 lbs. until further directed by your surgeon. o Avoid periods of inactivity such as sitting longer than an hour when not asleep. This helps prevent blood clots.  o You may return to work once you are authorized by your doctor.     WEIGHT BEARING   Weight bearing as tolerated with assist device (walker, cane,  etc) as directed, use it as long as suggested by your surgeon or therapist, typically at least 4-6 weeks.   EXERCISES  Results after joint replacement surgery are often greatly improved when you follow the exercise, range of motion and muscle strengthening exercises prescribed by your doctor. Safety measures are also important to protect the joint from further injury. Any time any of these exercises cause you to have increased pain or swelling, decrease what you are doing until you are comfortable again and then slowly increase them. If you have problems or questions, call your caregiver or physical therapist for advice.   Rehabilitation is important following a joint replacement. After just a few days of immobilization, the muscles of the leg can become weakened and shrink (atrophy).  These exercises are designed to build up the tone and strength of the thigh and leg muscles and to improve motion. Often times heat used for twenty to thirty minutes before working out will loosen up your tissues and help with improving the range of motion but do not use heat for the first two weeks following surgery (sometimes heat can increase post-operative swelling).   These exercises can be done on a training (exercise) mat, on the floor, on a table or on a bed. Use whatever works the best and is most comfortable for you.    Use music or television while you are exercising so that the exercises are a pleasant break in your  day. This will make your life better with the exercises acting as a break in your routine that you can look forward to.   Perform all exercises about fifteen times, three times per day or as directed.  You should exercise both the operative leg and the other leg as well.  Exercises include:    Quad Sets - Tighten up the muscle on the front of the thigh (Quad) and hold for 5-10 seconds.    Straight Leg Raises - With your knee straight (if you were given a brace, keep it on), lift the leg to 60  degrees, hold for 3 seconds, and slowly lower the leg.  Perform this exercise against resistance later as your leg gets stronger.   Leg Slides: Lying on your back, slowly slide your foot toward your buttocks, bending your knee up off the floor (only go as far as is comfortable). Then slowly slide your foot back down until your leg is flat on the floor again.   Angel Wings: Lying on your back spread your legs to the side as far apart as you can without causing discomfort.   Hamstring Strength:  Lying on your back, push your heel against the floor with your leg straight by tightening up the muscles of your buttocks.  Repeat, but this time bend your knee to a comfortable angle, and push your heel against the floor.  You may put a pillow under the heel to make it more comfortable if necessary.   A rehabilitation program following joint replacement surgery can speed recovery and prevent re-injury in the future due to weakened muscles. Contact your doctor or a physical therapist for more information on knee rehabilitation.    CONSTIPATION  Constipation is defined medically as fewer than three stools per week and severe constipation as less than one stool per week.  Even if you have a regular bowel pattern at home, your normal regimen is likely to be disrupted due to multiple reasons following surgery.  Combination of anesthesia, postoperative narcotics, change in appetite and fluid intake all can affect your bowels.   YOU MUST use at least one of the following options; they are listed in order of increasing strength to get the job done.  They are all available over the counter, and you may need to use some, POSSIBLY even all of these options:    Drink plenty of fluids (prune juice may be helpful) and high fiber foods Colace 100 mg by mouth twice a day  Senokot for constipation as directed and as needed Dulcolax (bisacodyl), take with full glass of water  Miralax (polyethylene glycol) once or twice a  day as needed.  If you have tried all these things and are unable to have a bowel movement in the first 3-4 days after surgery call either your surgeon or your primary doctor.    If you experience loose stools or diarrhea, hold the medications until you stool forms back up.  If your symptoms do not get better within 1 week or if they get worse, check with your doctor.  If you experience "the worst abdominal pain ever" or develop nausea or vomiting, please contact the office immediately for further recommendations for treatment.   ITCHING:  If you experience itching with your medications, try taking only a single pain pill, or even half a pain pill at a time.  You can also use Benadryl over the counter for itching or also to help with sleep.   TED HOSE STOCKINGS:  Use stockings on both legs until for at least 2 weeks or as directed by physician office. They may be removed at night for sleeping.  MEDICATIONS:  See your medication summary on the After Visit Summary that nursing will review with you.  You may have some home medications which will be placed on hold until you complete the course of blood thinner medication.  It is important for you to complete the blood thinner medication as prescribed.  PRECAUTIONS:  If you experience chest pain or shortness of breath - call 911 immediately for transfer to the hospital emergency department.   If you develop a fever greater that 101 F, purulent drainage from wound, increased redness or drainage from wound, foul odor from the wound/dressing, or calf pain - CONTACT YOUR SURGEON.                                                   FOLLOW-UP APPOINTMENTS:  If you do not already have a post-op appointment, please call the office for an appointment to be seen by your surgeon.  Guidelines for how soon to be seen are listed in your After Visit Summary, but are typically between 1-4 weeks after surgery.  OTHER INSTRUCTIONS:   Knee Replacement:  Do not place  pillow under knee, focus on keeping the knee straight while resting. CPM instructions: 0-90 degrees, 2 hours in the morning, 2 hours in the afternoon, and 2 hours in the evening. Place foam block, curve side up under heel at all times except when in CPM or when walking.  DO NOT modify, tear, cut, or change the foam block in any way.  MAKE SURE YOU:   Understand these instructions.   Get help right away if you are not doing well or get worse.    Thank you for letting us be a part of your medical care team.  It is a privilege we respect greatly.  We hope these instructions will help you stay on track for a fast and full recovery!

## 2016-02-29 ENCOUNTER — Encounter (HOSPITAL_COMMUNITY): Payer: Self-pay | Admitting: Orthopaedic Surgery

## 2016-02-29 LAB — BASIC METABOLIC PANEL
Anion gap: 6 (ref 5–15)
BUN: 19 mg/dL (ref 6–20)
CHLORIDE: 105 mmol/L (ref 101–111)
CO2: 28 mmol/L (ref 22–32)
Calcium: 8.5 mg/dL — ABNORMAL LOW (ref 8.9–10.3)
Creatinine, Ser: 1.15 mg/dL (ref 0.61–1.24)
GFR, EST NON AFRICAN AMERICAN: 57 mL/min — AB (ref >60)
GLUCOSE: 143 mg/dL — AB (ref 65–99)
Potassium: 5.1 mmol/L (ref 3.5–5.1)
Sodium: 139 mmol/L (ref 135–145)

## 2016-02-29 LAB — GLUCOSE, CAPILLARY
GLUCOSE-CAPILLARY: 130 mg/dL — AB (ref 65–99)
GLUCOSE-CAPILLARY: 264 mg/dL — AB (ref 65–99)
Glucose-Capillary: 152 mg/dL — ABNORMAL HIGH (ref 65–99)

## 2016-02-29 LAB — CBC
HEMATOCRIT: 34.9 % — AB (ref 39.0–52.0)
HEMOGLOBIN: 11.3 g/dL — AB (ref 13.0–17.0)
MCH: 31.8 pg (ref 26.0–34.0)
MCHC: 32.4 g/dL (ref 30.0–36.0)
MCV: 98.3 fL (ref 78.0–100.0)
Platelets: 150 10*3/uL (ref 150–400)
RBC: 3.55 MIL/uL — AB (ref 4.22–5.81)
RDW: 13.1 % (ref 11.5–15.5)
WBC: 11.2 10*3/uL — AB (ref 4.0–10.5)

## 2016-02-29 NOTE — Evaluation (Addendum)
Physical Therapy Evaluation Patient Details Name: Jared Bridges MRN: [id number] DOB: Apr 03, 1930 Today's Date: 02/29/2016   History of Present Illness  82 yo admitted for  L TKA revision due to loosening. PMHx: AV block, HOH, DM, HTN, L TKA  Clinical Impression  Pt pleasant but HOH and has difficulty with providing PLOF without direct cues. Pt moving well and has good strength and ROM LLE but will benefit from additional therapy to maximize strength, function and gait to return pt to independent function.     Follow Up Recommendations Home health PT    Equipment Recommendations  None recommended by PT    Recommendations for Other Services       Precautions / Restrictions Precautions Precautions: Fall;Knee Restrictions LLE Weight Bearing: Weight bearing as tolerated      Mobility  Bed Mobility Overal bed mobility: Modified Independent                Transfers Overall transfer level: Needs assistance   Transfers: Sit to/from Stand Sit to Stand: Supervision         General transfer comment: cues for hand placement and safety with IV  Ambulation/Gait Ambulation/Gait assistance: Min guard Ambulation Distance (Feet): 120 Feet Assistive device: Rolling walker (2 wheeled) Gait Pattern/deviations: Step-through pattern;Decreased stride length;Trunk flexed   Gait velocity interpretation: Below normal speed for age/gender General Gait Details: cues for posture, looking up, progression from step to to step through pattern  Stairs            Wheelchair Mobility    Modified Rankin (Stroke Patients Only)       Balance                                             Pertinent Vitals/Pain Pain Assessment: 0-10 Pain Score: 2  Pain Location: left knee Pain Descriptors / Indicators: Aching Pain Intervention(s): Limited activity within patient's tolerance;Monitored during session;Premedicated before session;Repositioned    Home Living  Family/patient expects to be discharged to:: Private residence Living Arrangements: Spouse/significant other Available Help at Discharge: Family;Available 24 hours/day Type of Home: House Home Access: Stairs to enter Entrance Stairs-Rails: None Entrance Stairs-Number of Steps: 2 Home Layout: Multi-level Home Equipment: Cane - single point;Walker - 2 wheels;Shower seat      Prior Function Level of Independence: Independent               Hand Dominance        Extremity/Trunk Assessment   Upper Extremity Assessment: Overall WFL for tasks assessed           Lower Extremity Assessment: Overall WFL for tasks assessed      Cervical / Trunk Assessment: Normal  Communication   Communication: HOH  Cognition Arousal/Alertness: Awake/alert Behavior During Therapy: WFL for tasks assessed/performed Overall Cognitive Status: Within Functional Limits for tasks assessed       Memory: Decreased short-term memory              General Comments      Exercises Total Joint Exercises Quad Sets: AROM;Left;5 reps;Supine Straight Leg Raises: AROM;Left;5 reps;Supine Knee Flexion: AROM;Left;5 reps;Supine      Assessment/Plan    PT Assessment Patient needs continued PT services  PT Diagnosis Acute pain;Difficulty walking   PT Problem List Decreased strength;Decreased range of motion;Decreased activity tolerance;Pain;Decreased knowledge of use of DME;Decreased mobility  PT Treatment Interventions DME instruction;Gait training;Functional  mobility training;Stair training;Therapeutic activities;Therapeutic exercise;Patient/family education;Balance training   PT Goals (Current goals can be found in the Care Plan section) Acute Rehab PT Goals Patient Stated Goal: return home, paint and travel PT Goal Formulation: With patient Time For Goal Achievement: 03/07/16 Potential to Achieve Goals: Good    Frequency 7X/week   Barriers to discharge        Co-evaluation                End of Session Equipment Utilized During Treatment: Gait belt Activity Tolerance: Patient tolerated treatment well Patient left: in chair;with call bell/phone within reach;with chair alarm set Nurse Communication: Mobility status;Weight bearing status         Time: [id number] PT Time Calculation (min) (ACUTE ONLY): 33 min   Charges:   PT Evaluation $PT Eval Moderate Complexity: 1 Procedure PT Treatments $Gait Training: 8-22 mins   PT G CodesMelford Aase 02/29/2016, 8:44 AM Elwyn Reach, [place]

## 2016-02-29 NOTE — Anesthesia Postprocedure Evaluation (Signed)
Anesthesia Post Note  Patient: Jared Bridges  Procedure(s) Performed: Procedure(s) (LRB): LEFT TOTAL KNEE REVISION (Left)  Patient location during evaluation: PACU Anesthesia Type: Spinal Level of consciousness: awake Pain management: pain level controlled Vital Signs Assessment: post-procedure vital signs reviewed and stable Respiratory status: spontaneous breathing and respiratory function stable Cardiovascular status: stable Postop Assessment: no signs of nausea or vomiting Anesthetic complications: no    Last Vitals:  Filed Vitals:   02/29/16 0020 02/29/16 0520  BP: 115/46 109/50  Pulse: 65 71  Temp: 36.7 C 37 C  Resp: 14 15    Last Pain:  Filed Vitals:   02/29/16 0731  PainSc: Asleep                 Babe Anthis

## 2016-02-29 NOTE — Progress Notes (Signed)
Occupational Therapy Evaluation Patient Details Name: Jared Bridges MRN: [id number] DOB: 03/01/1930 Today's Date: 02/29/2016    History of Present Illness 82 yo admitted for  L TKA revision due to loosening. PMHx: AV block, HOH, DM, HTN, L TKA   Clinical Impression   PTA, pt was independent with ADLs and mobility. Pt currently requires min assist for LB ADLs and min guard assist for transfer and ambulation. Pt appeared somewhat confused, had difficulty sequencing through tasks, and has been getting up frequently unassisted despite bed/chair alarms and multiple cues to call for assistance first according to NT. Pt plans to d/c home with 24/7 assistance from his wife and intermittent assistance from his daughter. Pt will benefit from continued acute OT to increase independence with ADLs and mobility to allow for safe discharge home. Recommend HHOT for ADL retraining and safety evaluation.     Follow Up Recommendations  Home health OT;Supervision/Assistance - 24 hour    Equipment Recommendations  None recommended by OT    Recommendations for Other Services       Precautions / Restrictions Precautions Precautions: Fall;Knee Precaution Booklet Issued: No Precaution Comments: Reviewed not placing pillow, ice pack or other object under L knee Restrictions Weight Bearing Restrictions: Yes LLE Weight Bearing: Weight bearing as tolerated      Mobility Bed Mobility Overal bed mobility: Modified Independent             General bed mobility comments: HOB flat, use of bedrail.  Transfers Overall transfer level: Needs assistance Equipment used: Rolling walker (2 wheeled) Transfers: Sit to/from Stand Sit to Stand: Min guard         General transfer comment: Min guard for safety. No physical assist required, no reports of dizziness or LOB observed. Pt with difficulty sequencing through mobility tasks at times, unsure if this is pt's baseline.    Balance Overall balance  assessment: Needs assistance Sitting-balance support: No upper extremity supported;Feet supported Sitting balance-Leahy Scale: Good     Standing balance support: During functional activity;No upper extremity supported Standing balance-Leahy Scale: Fair Standing balance comment: Able to maintain static standing balance for brief period of time without UE support                            ADL Overall ADL's : Needs assistance/impaired     Grooming: Wash/dry hands;Min guard;Standing   Upper Body Bathing: Sitting;Supervision/ safety   Lower Body Bathing: Min guard;Sit to/from stand   Upper Body Dressing : Supervision/safety;Sitting   Lower Body Dressing: Minimal assistance;Sit to/from stand;Cueing for compensatory techniques Lower Body Dressing Details (indicate cue type and reason): wife can assist Toilet Transfer: Min guard;Cueing for safety;Ambulation;RW;Comfort height toilet;Grab bars Toilet Transfer Details (indicate cue type and reason): Cues to feel toilet on back of legs before reaching back for grab bar and sitting Toileting- Clothing Manipulation and Hygiene: Min guard;Sit to/from stand       Functional mobility during ADLs: Min guard;Rolling walker General ADL Comments: Reviewed knee precautions, use of foam to elevate LLE, pain/edema management using ice, and compensatory strategies for ADLs. No family present for OT eval.     Vision Vision Assessment?: No apparent visual deficits Additional Comments: Pt reported that after his stroke his vision changed a bit, but was unable to elaborate   Perception     Praxis      Pertinent Vitals/Pain Pain Assessment: 0-10 Pain Score: 2  Pain Location: L knee Pain Descriptors /  Indicators: Aching Pain Intervention(s): Limited activity within patient's tolerance;Monitored during session;Repositioned;Ice applied     Hand Dominance Right   Extremity/Trunk Assessment Upper Extremity Assessment Upper Extremity  Assessment: Overall WFL for tasks assessed   Lower Extremity Assessment Lower Extremity Assessment: LLE deficits/detail LLE Deficits / Details: decreased ROM and strength as expected post op   Cervical / Trunk Assessment Cervical / Trunk Assessment: Normal   Communication Communication Communication: HOH   Cognition Arousal/Alertness: Awake/alert Behavior During Therapy: WFL for tasks assessed/performed Overall Cognitive Status: No family/caregiver present to determine baseline cognitive functioning (Pt's had difficulty sequencing and appeared confused at time)       Memory: Decreased short-term memory             General Comments       Exercises       Shoulder Instructions      Home Living Family/patient expects to be discharged to:: Private residence Living Arrangements: Spouse/significant other Available Help at Discharge: Family;Available 24 hours/day Type of Home: House Home Access: Stairs to enter [employer] of Steps: 2 Entrance Stairs-Rails: None Home Layout: Multi-level Alternate Level Stairs-Number of Steps: 18 Alternate Level Stairs-Rails: Right;Left Bathroom Shower/Tub: Tub/shower unit;Curtain Shower/tub characteristics: Curtain Bathroom Toilet: Handicapped height     Home Equipment: Environmental consultant - 2 wheels;Cane - single point;Bedside commode;Shower seat;Hand held shower head          Prior Functioning/Environment Level of Independence: Independent             OT Diagnosis: Acute pain;Cognitive deficits   OT Problem List: Decreased strength;Decreased range of motion;Decreased activity tolerance;Impaired balance (sitting and/or standing);Decreased safety awareness;Decreased knowledge of use of DME or AE;Decreased knowledge of precautions;Pain   OT Treatment/Interventions: Self-care/ADL training;Therapeutic exercise;Energy conservation;DME and/or AE instruction;Therapeutic activities;Patient/family education;Balance training    OT  Goals(Current goals can be found in the care plan section) Acute Rehab OT Goals Patient Stated Goal: return home, paint and travel OT Goal Formulation: With patient Time For Goal Achievement: 03/14/16 Potential to Achieve Goals: Good ADL Goals Pt Will Perform Grooming: with modified independence;standing Pt Will Perform Lower Body Bathing: with modified independence;sit to/from stand Pt Will Perform Lower Body Dressing: with modified independence;sit to/from stand Pt Will Transfer to Toilet: with modified independence;ambulating;bedside commode (over toilet) Pt Will Perform Toileting - Clothing Manipulation and hygiene: with modified independence;sitting/lateral leans;sit to/from stand  OT Frequency: Min 2X/week   Barriers to D/C:            Co-evaluation              End of Session Equipment Utilized During Treatment: Gait belt;Rolling walker Nurse Communication: Mobility status;Other (comment) (Pt up in chair with chair alarm)  Activity Tolerance: Patient tolerated treatment well Patient left: in chair;with call bell/phone within reach;with chair alarm set;Other (comment) (LLE elevated and ice applied)   Time: 1541-1601 OT Time Calculation (min): 20 min Charges:  OT General Charges $OT Visit: 1 Procedure OT Evaluation $OT Eval Moderate Complexity: 1 Procedure G-Codes:    Redmond Baseman, OTR/L [phone] 02/29/2016, 4:25 PM

## 2016-02-29 NOTE — Progress Notes (Signed)
Physical Therapy Treatment Patient Details Name: Jared Bridges MRN: [id number] DOB: [birth year] Today's Date: 02/29/2016    History of Present Illness 82 yo admitted for  L TKA revision due to loosening. PMHx: AV block, HOH, DM, HTN, L TKA    PT Comments    Pt continues to demonstrate excellent mobility with supervision needed for safety and balance. Pt's wife and dgtr present throughout and educated for function, rOM and D/C recommendation. Will continue to follow acutely to maximize safety and independence. Pt with heel roll end of session and family educated for use.   Follow Up Recommendations  Home health PT;Supervision/Assistance - 24 hour     Equipment Recommendations  None recommended by PT    Recommendations for Other Services       Precautions / Restrictions Precautions Precautions: Fall;Knee Restrictions LLE Weight Bearing: Weight bearing as tolerated    Mobility  Bed Mobility Overal bed mobility: Modified Independent             General bed mobility comments: with rail   Transfers Overall transfer level: Needs assistance   Transfers: Sit to/from Stand Sit to Stand: Supervision         General transfer comment: cues for hand placement and safety from bed and to chair  Ambulation/Gait Ambulation/Gait assistance: Min guard Ambulation Distance (Feet): 550 Feet Assistive device: Rolling walker (2 wheeled) Gait Pattern/deviations: Step-through pattern;Decreased stride length;Trunk flexed   Gait velocity interpretation: at or above normal speed for age/gender General Gait Details: cues for posture, looking up, position in RW   Stairs Stairs: Yes Stairs assistance: Min guard Stair Management: Step to pattern;Forwards;One rail Left Number of Stairs: 11 General stair comments: pt able to perform sequence without cues, tactile and verbal cues for increased knee flexion left with ascent  Wheelchair Mobility    Modified Rankin (Stroke Patients  Only)       Balance                                    Cognition Arousal/Alertness: Awake/alert Behavior During Therapy: WFL for tasks assessed/performed         Memory: Decreased short-term memory              Exercises Total Joint Exercises Hip ABduction/ADduction: AROM;Left;15 reps;Supine Straight Leg Raises: AROM;Left;15 reps;Supine Knee Flexion: AROM;Left;15 reps;Supine Goniometric ROM: 5-92    General Comments        Pertinent Vitals/Pain Pain Assessment: No/denies pain    Home Living                      Prior Function            PT Goals (current goals can now be found in the care plan section) Acute Rehab PT Goals Patient Stated Goal: return home, paint and travel PT Goal Formulation: With patient Time For Goal Achievement: 03/07/16 Potential to Achieve Goals: Good Progress towards PT goals: Progressing toward goals    Frequency  7X/week    PT Plan Current plan remains appropriate    Co-evaluation             End of Session Equipment Utilized During Treatment: Gait belt Activity Tolerance: Patient tolerated treatment well Patient left: in chair;with call bell/phone within reach;with chair alarm set;with family/visitor present     Time: 1133-1204 PT Time Calculation (min) (ACUTE ONLY): 31 min  Charges:  $Gait Training: 8-22 mins $  Therapeutic Exercise: 8-22 mins                    G Codes:      Melford Aase 20-Mar-2016, 12:17 PM Elwyn Reach, [place]

## 2016-02-29 NOTE — Progress Notes (Signed)
Utilization review completed.  

## 2016-03-01 LAB — CBC
HEMATOCRIT: 36.1 % — AB (ref 39.0–52.0)
HEMOGLOBIN: 11.3 g/dL — AB (ref 13.0–17.0)
MCH: 30.4 pg (ref 26.0–34.0)
MCHC: 31.3 g/dL (ref 30.0–36.0)
MCV: 97 fL (ref 78.0–100.0)
Platelets: 165 10*3/uL (ref 150–400)
RBC: 3.72 MIL/uL — AB (ref 4.22–5.81)
RDW: 12.9 % (ref 11.5–15.5)
WBC: 19.6 10*3/uL — AB (ref 4.0–10.5)

## 2016-03-01 NOTE — Clinical Social Work Note (Signed)
Clinical Social Worker received standing order referral for possible ST-SNF placement.  Chart reviewed.  PT/OT recommending home with home health. RN Case Manager to follow up with patient to discuss home health needs.    CSW signing off - please re consult if social work needs arise.  Barbette Or, [place]

## 2016-03-01 NOTE — Progress Notes (Signed)
Physical Therapy Treatment Patient Details Name: Jared Bridges MRN: [id number] DOB: 06-Jun-1930 Today's Date: 03/01/2016    History of Present Illness 82 yo admitted for  L TKA revision due to loosening. PMHx: AV block, HOH, DM, HTN, L TKA    PT Comments    Doing exceptionally well, very motivated. Safely navigates 2 flights of steps today which will allow him to enter his bed room at home. No physical assist required during therapy session. Tolerates and verbalizes understanding of therapeutic exercises and precautions. Will continue to follow until d/c.  Follow Up Recommendations  Home health PT;Supervision for mobility/OOB     Equipment Recommendations  None recommended by PT    Recommendations for Other Services       Precautions / Restrictions Precautions Precautions: Fall;Knee Precaution Booklet Issued: No Precaution Comments: Reviewed not placing pillow, ice pack or other object under L knee Restrictions Weight Bearing Restrictions: Yes LLE Weight Bearing: Weight bearing as tolerated    Mobility  Bed Mobility Overal bed mobility: Modified Independent             General bed mobility comments: with rail   Transfers Overall transfer level: Needs assistance Equipment used: Rolling walker (2 wheeled) Transfers: Sit to/from Stand Sit to Stand: Supervision         General transfer comment: supervision for safety. VC for hand placement and technique.  Ambulation/Gait Ambulation/Gait assistance: Supervision Ambulation Distance (Feet): 475 Feet Assistive device: Rolling walker (2 wheeled) Gait Pattern/deviations: Step-through pattern;Decreased stance time - left;Decreased stride length;Trunk flexed Gait velocity: decreased Gait velocity interpretation: Below normal speed for age/gender General Gait Details: Intermittly required cues for walker placement and posture. Demonstrates good knee stability on Lt throughout gait cycle. No overt loss of balance. Lightly  using RW for support. Foccused on symmetry of gait and safety. A bit slower this afternoon but stable.   Stairs Stairs: Yes Stairs assistance: Supervision Stair Management: One rail Right;Step to pattern;Forwards Number of Stairs: 24 General stair comments: Rail on right to simiulate steps inside of home to get to bedroom. VC for sequencing. No buckling noted. Slow but stable. States he feels confident with this task  Wheelchair Mobility    Modified Rankin (Stroke Patients Only)       Balance                                    Cognition Arousal/Alertness: Awake/alert Behavior During Therapy: WFL for tasks assessed/performed Overall Cognitive Status: Within Functional Limits for tasks assessed                      Exercises Total Joint Exercises Ankle Circles/Pumps: AROM;Both;10 reps;Seated Quad Sets: Strengthening;Both;10 reps;Seated Hip ABduction/ADduction: AROM;15 reps;Supine;Both Long Arc Quad: Strengthening;10 reps;Seated;Left Knee Flexion: AROM;Left;10 reps;Seated Goniometric ROM: approx 5-90 degrees left knee flexion    General Comments        Pertinent Vitals/Pain Pain Assessment: 0-10 Pain Score: 2  Pain Location: Lt knee Pain Descriptors / Indicators: Sore Pain Intervention(s): Monitored during session;Repositioned    Home Living                      Prior Function            PT Goals (current goals can now be found in the care plan section) Acute Rehab PT Goals Patient Stated Goal: return home, paint and travel PT Goal Formulation: With  patient Time For Goal Achievement: 03/07/16 Potential to Achieve Goals: Good Progress towards PT goals: Progressing toward goals    Frequency  7X/week    PT Plan Current plan remains appropriate    Co-evaluation             End of Session Equipment Utilized During Treatment: Gait belt Activity Tolerance: Patient tolerated treatment well Patient left: in chair;with  call bell/phone within reach;with chair alarm set     Time: [id number] PT Time Calculation (min) (ACUTE ONLY): 36 min  Charges:  $Gait Training: 8-22 mins $Therapeutic Exercise: 8-22 mins                    G Codes:      Ellouise Newer 29-Mar-2016, 4:20 PM  Camille Bal Morgan, [place]

## 2016-03-01 NOTE — Progress Notes (Signed)
Patient ID: Jared Bridges, male   DOB: July 10, 1930, 82 y.o.   MRN: [id number] Postoperative day 1 revision total knee arthroplasty. Patient's hemoglobin is stable. Physical therapy progressive ambulation weightbearing as tolerated.

## 2016-03-01 NOTE — Progress Notes (Signed)
Physical Therapy Treatment Patient Details Name: Jared Bridges MRN: [id number] DOB: Jun 28, 1930 Today's Date: 03/01/2016    History of Present Illness 82 yo admitted for  L TKA revision due to loosening. PMHx: AV block, HOH, DM, HTN, L TKA    PT Comments    Good progress towards functional goals. Family requests patient practice flight of stairs again this afternoon as his bedroom is upstairs and has difficulty navigating steps at baseline. Will follow-up. Patient will continue to benefit from skilled physical therapy services to further improve independence with functional mobility.   Follow Up Recommendations  Home health PT;Supervision for mobility/OOB     Equipment Recommendations  None recommended by PT    Recommendations for Other Services       Precautions / Restrictions Precautions Precautions: Fall;Knee Precaution Booklet Issued: No Precaution Comments: Reviewed not placing pillow, ice pack or other object under L knee Restrictions Weight Bearing Restrictions: Yes LLE Weight Bearing: Weight bearing as tolerated    Mobility  Bed Mobility Overal bed mobility: Modified Independent             General bed mobility comments: with rail   Transfers Overall transfer level: Needs assistance Equipment used: Rolling walker (2 wheeled) Transfers: Sit to/from Stand Sit to Stand: Supervision         General transfer comment: supervision for safety. VC for hand placement and technique. Required 2 attempts from low bed setting.  Ambulation/Gait Ambulation/Gait assistance: Supervision Ambulation Distance (Feet): 275 Feet Assistive device: Rolling walker (2 wheeled) Gait Pattern/deviations: Step-through pattern;Decreased stride length;Decreased stance time - left;Trunk flexed;Antalgic   Gait velocity interpretation: at or above normal speed for age/gender General Gait Details: Cues for posture, walker placement and forward gaze. Focused on symmetry of gait. No overt  loss of balance noted. Mildly antalgic.   Stairs Stairs: Yes Stairs assistance: Min guard Stair Management: Step to pattern;Forwards Number of Stairs: 2 General stair comments: Using single rail similar to home entrance. No buckling noted, good Lt knee strength.  Wheelchair Mobility    Modified Rankin (Stroke Patients Only)       Balance                                    Cognition Arousal/Alertness: Awake/alert Behavior During Therapy: WFL for tasks assessed/performed Overall Cognitive Status: Within Functional Limits for tasks assessed       Memory: Decreased short-term memory              Exercises Total Joint Exercises Ankle Circles/Pumps: AROM;Both;10 reps;Seated Quad Sets: Strengthening;Both;10 reps;Seated Long Arc Quad: Strengthening;10 reps;Seated;Left    General Comments        Pertinent Vitals/Pain Pain Assessment: 0-10 Pain Score: 2  Pain Location: Lt knee Pain Descriptors / Indicators: Sore Pain Intervention(s): Monitored during session;Repositioned    Home Living                      Prior Function            PT Goals (current goals can now be found in the care plan section) Acute Rehab PT Goals Patient Stated Goal: return home, paint and travel PT Goal Formulation: With patient Time For Goal Achievement: 03/07/16 Potential to Achieve Goals: Good Progress towards PT goals: Progressing toward goals    Frequency  7X/week    PT Plan Current plan remains appropriate    Co-evaluation  End of Session Equipment Utilized During Treatment: Gait belt Activity Tolerance: Patient tolerated treatment well Patient left: in chair;with call bell/phone within reach;with chair alarm set     Time: 1055-1120 PT Time Calculation (min) (ACUTE ONLY): 25 min  Charges:  $Gait Training: 8-22 mins $Therapeutic Exercise: 8-22 mins                    G Codes:      Ellouise Newer Mar 30, 2016, 11:56  AM Elayne Snare, [place]

## 2016-03-02 NOTE — Discharge Summary (Signed)
Physician Discharge Summary  Patient ID: SAUEL ROBNETT MRN: [id number] DOB/AGE: 05/23/1930 82 y.o.  Admit date: 02/28/2016 Discharge date: 03/02/2016  Admission Diagnoses:Aseptic loosening total knee arthroplasty  Discharge Diagnoses:  Active Problems:   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[name]   Total knee replacement status   Discharged Condition: stable  Hospital Course: Patient's hospital course was essentially unremarkable. He underwent revision total knee arthroplasty. Postoperatively he progressed well and was discharged to home in stable condition.  Consults: None  Significant Diagnostic Studies: labs: Routine labs  Treatments: surgery: See operative note  Discharge Exam: Blood pressure 113/50, pulse 55, temperature 97.6 F (36.4 C), temperature source Oral, resp. rate 16, height 6\' 2"  (1.88 m), weight 95.709 kg (211 lb), SpO2 96 %. Incision/Wound: Dressing clean and dry  Disposition: 01-Home or Self Care     Medication List    TAKE these medications        aspirin EC 325 MG tablet  Take 1 tablet (325 mg total) by mouth 2 (two) times daily.     methocarbamol 750 MG tablet  Commonly known as:  ROBAXIN  Take 1 tablet (750 mg total) by mouth 2 (two) times daily as needed for muscle spasms.     ondansetron 4 MG tablet  Commonly known as:  ZOFRAN  Take 1-2 tablets (4-8 mg total) by mouth every 8 (eight) hours as needed for nausea or vomiting.     oxyCODONE 10 mg 12 hr tablet  Commonly known as:  OXYCONTIN  Take 1 tablet (10 mg total) by mouth every 12 (twelve) hours.     oxyCODONE 5 MG immediate release tablet  Commonly known as:  Oxy IR/ROXICODONE  Take 1-3 tablets (5-15 mg total) by mouth every 4 (four) hours as needed.     senna-docusate 8.6-50 MG tablet  Commonly known as:  SENOKOT S  Take 1 tablet by mouth at bedtime as needed.      ASK your doctor about these medications        atorvastatin 40 MG tablet  Commonly known as:  LIPITOR  Take 1  tablet (40 mg total) by mouth daily at 6 PM.     clonazePAM 1 MG tablet  Commonly known as:  KLONOPIN  Take 1 tablet (1 mg total) by mouth at bedtime.     diclofenac sodium 1 % Gel  Commonly known as:  VOLTAREN  Apply 2 g topically daily as needed (for pain).     divalproex 250 MG DR tablet  Commonly known as:  DEPAKOTE  Take 1 tablet (250 mg total) by mouth every morning.     escitalopram 5 MG tablet  Commonly known as:  LEXAPRO  Take 5 mg by mouth at bedtime.     fluticasone 50 MCG/ACT nasal spray  Commonly known as:  FLONASE  Place 1 spray into both nostrils 2 (two) times daily as needed for allergies.     HYDROcodone-acetaminophen 5-325 MG tablet  Commonly known as:  NORCO/VICODIN  Take 1-2 tablets by mouth every 4 (four) hours as needed for moderate pain.     ibuprofen 200 MG tablet  Commonly known as:  ADVIL,MOTRIN  Take 400 mg by mouth 3 (three) times daily. For osteoarthritis and carpal tunnel     predniSONE 5 MG tablet  Commonly known as:  DELTASONE  Take 5 mg by mouth 2 (two) times a week. Tuesday and Thursday     pregabalin 25 MG capsule  Commonly known as:  LYRICA  Take  25 mg by mouth at bedtime.     traMADol 50 MG tablet  Commonly known as:  ULTRAM  Take 1-2 tablets by mouth every 6 (six) hours as needed for moderate pain.     venlafaxine XR 150 MG 24 hr capsule  Commonly known as:  EFFEXOR-XR  Take 1 capsule (150 mg total) by mouth daily.     Vitamin D3 5000 units Caps  Take 5,000 Units by mouth at bedtime.           Follow-up Information    Follow up with Marianna Payment, MD In 2 weeks.   Specialty:  Orthopedic Surgery   Why:  For suture removal, For wound re-check   Contact information:   [address] [place] [address] [phone]       Signed: Newt Minion 03/02/2016, 10:19 AM

## 2016-03-02 NOTE — Progress Notes (Addendum)
Occupational Therapy Treatment Patient Details Name: Jared Bridges MRN: [id number] DOB: 06-23-1930 Today's Date: 03/02/2016    History of present illness 82 y.o. admitted for  L TKA revision due to loosening. PMHx: AV block, HOH, DM, HTN, L TKA   OT comments  Pt progressing. Updated d/c recommendation to no OT follow up. Education provided in session and OT signing off.  Follow Up Recommendations  No OT follow up;Supervision - Intermittent    Equipment Recommendations  None recommended by OT    Recommendations for Other Services      Precautions / Restrictions Precautions Precautions: Fall;Knee Precaution Booklet Issued: No Precaution Comments: educated on knee precautions Restrictions Weight Bearing Restrictions: Yes LLE Weight Bearing: Weight bearing as tolerated       Mobility Bed Mobility        General bed mobility comments: not assessed  Transfers Overall transfer level: Needs assistance Equipment used: Rolling walker (2 wheeled) Transfers: Sit to/from Stand Sit to Stand: Min guard         General transfer comment: cues for hand placement and set up for RW; Supervision-Min guard for sit to stand    Balance No LOB with ambulation-pt used RW              ADL Overall ADL's : Needs assistance/impaired                     Lower Body Dressing: Set up;Supervision/safety;Sit to/from stand;Sitting/lateral leans Lower Body Dressing Details (indicate cue type and reason): donned/doffed left sock and donned underwear Toilet Transfer: RW;Min guard;Ambulation (Supervision-Min guard for sit to stand; Supervision for ambulation and set up for RW)       Tub/ Shower Transfer: Tub transfer;Min guard;Ambulation;Tub bench;Rolling walker   Functional mobility during ADLs: Supervision/safety;Rolling walker (supervision for ambulation and set up for RW; Supervision-Min guard for sit to stand transfer and set up for RW) General ADL Comments: Educated on LB  dressing technique. Educated on safety such as safe footwear, use of bag on walker, sitting for LB ADLs. Educated on tub transfer technique using tub bench as he reports this is what he has at home. Recommended him not step over tub yet. Recommended someone be with him for tub transfer and while bathing. mentioned reacher.      Vision                     Perception     Praxis      Cognition  Awake/Alert Behavior During Therapy: WFL for tasks assessed/performed Overall Cognitive Status: Within Functional Limits for tasks assessed                       Extremity/Trunk Assessment                  Shoulder Instructions       General Comments      Pertinent Vitals/ Pain       Pain Assessment: 0-10 Pain Score: 1  Pain Location: LLE Pain Intervention(s): Monitored during session;Repositioned  Home Living                                          Prior Functioning/Environment              Frequency Min 2X/week     Progress Toward Goals  OT Goals(current goals  can now be found in the care plan section)  Progress towards OT goals: Progressing toward goals-adequate for d/c  Acute Rehab OT Goals Patient Stated Goal: not stated OT Goal Formulation: With patient Time For Goal Achievement: 03/14/16 Potential to Achieve Goals: Good ADL Goals Pt Will Perform Grooming: with modified independence;standing Pt Will Perform Lower Body Bathing: with modified independence;sit to/from stand Pt Will Perform Lower Body Dressing: with modified independence;sit to/from stand Pt Will Transfer to Toilet: with modified independence;ambulating;bedside commode (over toilet) Pt Will Perform Toileting - Clothing Manipulation and hygiene: with modified independence;sitting/lateral leans;sit to/from stand  Plan Discharge plan needs to be updated    Co-evaluation                 End of Session Equipment Utilized During Treatment: Gait  belt;Rolling walker   Activity Tolerance Patient tolerated treatment well   Patient Left in chair;with chair alarm set   Nurse Communication Other (comment) (talked to secretary to check to see if pt had call bell)        Time: [id number] OT Time Calculation (min): 18 min  Charges: OT General Charges $OT Visit: 1 Procedure OT Treatments $Self Care/Home Management : 8-22 mins  Benito Mccreedy OTR/L [id number] 03/02/2016, 10:47 AM

## 2016-03-02 NOTE — Care Management Note (Signed)
Case Management Note  Patient Details  Name: Jared Bridges MRN: [id number] Date of Birth: Aug 24, 1930  Subjective/Objective:                  L TKA Action/Plan: Discharge planning Expected Discharge Date:  03/02/16               Expected Discharge Plan:  [place]  In-House Referral:     Discharge planning Services  CM Consult  Post Acute Care Choice:  Durable Medical Equipment, Home Health Choice offered to:  Patient  DME Arranged:  3-N-1 DME Agency:  [place]:  PT [facility] Agency:  [place]  Status of Service:  Completed, signed off  Medicare Important Message Given:    Date Medicare IM Given:    Medicare IM give by:    Date Additional Medicare IM Given:    Additional Medicare Important Message give by:     If discussed at [place] of Stay Meetings, dates discussed:    Additional Comments: Cm spoke with pt to offer choice of home health agency.  Pt chooses Verl Dicker of [facility] to render HHPT.  Pt has an order for HHPT/AIDE.  Referral called to [facility] rep, [name] with request for [employer].  CM called [facility] DME rep to please deliver the 3n1 to room so pt can discharge. No other CM needs were communicated. Dellie Catholic, RN 03/02/2016, 2:04 PM

## 2016-03-02 NOTE — Progress Notes (Signed)
Physical Therapy Treatment Patient Details Name: Jared Bridges MRN: [id number] DOB: 11-Dec-1929 Today's Date: 03/02/2016    History of Present Illness 82 yo admitted for  L TKA revision due to loosening. PMHx: AV block, HOH, DM, HTN, L TKA    PT Comments    Pt making steady gains during PT sessions. Pt reporting that he is hoping to be able to D/C to home. Based upon the patient's current mobility and assistance at home, anticipate D/C to home when medically released. PT to continue to follow.   Follow Up Recommendations  Home health PT;Supervision for mobility/OOB     Equipment Recommendations  None recommended by PT    Recommendations for Other Services       Precautions / Restrictions Precautions Precautions: Fall;Knee Precaution Booklet Issued: Yes (comment) Precaution Comments: reviewed HEP Restrictions Weight Bearing Restrictions: Yes LLE Weight Bearing: Weight bearing as tolerated    Mobility  Bed Mobility Overal bed mobility: Modified Independent             General bed mobility comments: with rail   Transfers Overall transfer level: Needs assistance Equipment used: Rolling walker (2 wheeled) Transfers: Sit to/from Stand Sit to Stand: Supervision         General transfer comment: supervision for safety  Ambulation/Gait Ambulation/Gait assistance: Supervision Ambulation Distance (Feet): 425 Feet Assistive device: Rolling walker (2 wheeled) Gait Pattern/deviations: Step-through pattern Gait velocity: decreased   General Gait Details: occasional cues for position in rw.    Stairs            Wheelchair Mobility    Modified Rankin (Stroke Patients Only)       Balance Overall balance assessment: Needs assistance Sitting-balance support: No upper extremity supported Sitting balance-Leahy Scale: Good     Standing balance support: During functional activity Standing balance-Leahy Scale: Fair Standing balance comment: using rw                     Cognition Arousal/Alertness: Awake/alert Behavior During Therapy: WFL for tasks assessed/performed Overall Cognitive Status: Within Functional Limits for tasks assessed                      Exercises Total Joint Exercises Ankle Circles/Pumps: AROM;Both;10 reps;Seated Quad Sets: Strengthening;Both;10 reps;Seated Short Arc Quad: Strengthening;Left;10 reps Heel Slides: Left;AAROM;10 reps Hip ABduction/ADduction: Strengthening;Left;10 reps Straight Leg Raises: Strengthening;Left;10 reps Goniometric ROM: approx. 3-90    General Comments        Pertinent Vitals/Pain Pain Assessment: 0-10 Pain Score: 2  Pain Location: Lt knee Pain Descriptors / Indicators: Sore Pain Intervention(s): Monitored during session    Home Living                      Prior Function            PT Goals (current goals can now be found in the care plan section) Acute Rehab PT Goals Patient Stated Goal: return home PT Goal Formulation: With patient Time For Goal Achievement: 03/07/16 Potential to Achieve Goals: Good Progress towards PT goals: Progressing toward goals    Frequency  7X/week    PT Plan Current plan remains appropriate    Co-evaluation             End of Session Equipment Utilized During Treatment: Gait belt Activity Tolerance: Patient tolerated treatment well Patient left: in chair;with call bell/phone within reach;with chair alarm set;Other (comment) (in knee extension stretch)     Time: [phone]  PT Time Calculation (min) (ACUTE ONLY): 30 min  Charges:  $Gait Training: 8-22 mins $Therapeutic Exercise: 8-22 mins                    G Codes:      Cassell Clement, PT, CSCS Pager [phone] Office [phone]  03/02/2016, 9:00 AM

## 2016-03-02 NOTE — Progress Notes (Signed)
Physical Therapy Treatment Patient Details Name: Jared Bridges MRN: [id number] DOB: 1930/03/28 Today's Date: 03/02/2016    History of Present Illness 82 y.o. admitted for  L TKA revision due to loosening. PMHx: AV block, HOH, DM, HTN, L TKA    PT Comments    Pt continues to make gradual progress with PT. Reminders for safety needed during mobility and recommending assistance when up and moving. Discussed with pt. PT to follow acutely and recommending HHPT once D/C. Pt denies any questions or concerns following session.   Follow Up Recommendations  Home health PT;Supervision for mobility/OOB     Equipment Recommendations  None recommended by PT    Recommendations for Other Services       Precautions / Restrictions Precautions Precautions: Fall;Knee Precaution Comments: reviewed HEP Restrictions Weight Bearing Restrictions: Yes LLE Weight Bearing: Weight bearing as tolerated    Mobility  Bed Mobility               General bed mobility comments: up in chair upon arrival, returned to chair upon pt request  Transfers Overall transfer level: Needs assistance Equipment used: Rolling walker (2 wheeled) Transfers: Sit to/from Stand Sit to Stand: Supervision         General transfer comment: supervision for safety  Ambulation/Gait Ambulation/Gait assistance: Supervision Ambulation Distance (Feet): 375 Feet Assistive device: Rolling walker (2 wheeled) Gait Pattern/deviations: Step-through pattern Gait velocity: decreased   General Gait Details: cues needed for safety when using device. Pt not using to turn full to sit, refusing to use to enter bathroom.    Stairs         General stair comments: pt declined, states that he feels confident.   Wheelchair Mobility    Modified Rankin (Stroke Patients Only)       Balance Overall balance assessment: Needs assistance Sitting-balance support: No upper extremity supported Sitting balance-Leahy Scale: Good      Standing balance support: During functional activity Standing balance-Leahy Scale: Fair Standing balance comment: using rw for support                    Cognition Arousal/Alertness: Awake/alert Behavior During Therapy: WFL for tasks assessed/performed Overall Cognitive Status: Within Functional Limits for tasks assessed                      Exercises Total Joint Exercises Ankle Circles/Pumps: AAROM;Both;15 reps Quad Sets: Strengthening;Both;10 reps Short Arc Quad: Strengthening;Left;10 reps Heel Slides: Left;AAROM;10 reps Hip ABduction/ADduction: Strengthening;Left;10 reps Straight Leg Raises: Strengthening;Left;10 reps Long Arc Quad: Strengthening;Left;10 reps Knee Flexion: AROM;Left;10 reps    General Comments        Pertinent Vitals/Pain Pain Assessment: 0-10 Pain Score: 2  Pain Location: Lt knee Pain Descriptors / Indicators: Sore Pain Intervention(s): Monitored during session;Limited activity within patient's tolerance    Home Living                      Prior Function            PT Goals (current goals can now be found in the care plan section) Acute Rehab PT Goals Patient Stated Goal: be able to travel to [place] PT Goal Formulation: With patient Time For Goal Achievement: 03/07/16 Potential to Achieve Goals: Good Progress towards PT goals: Progressing toward goals    Frequency  7X/week    PT Plan Current plan remains appropriate    Co-evaluation  End of Session Equipment Utilized During Treatment: Gait belt Activity Tolerance: Patient tolerated treatment well Patient left: in chair;with call bell/phone within reach;with chair alarm set;Other (comment) (in knee extension)     Time: 1325-1401 PT Time Calculation (min) (ACUTE ONLY): 36 min  Charges:  $Gait Training: 8-22 mins $Therapeutic Exercise: 8-22 mins                    G Codes:      Cassell Clement, PT, CSCS Pager [phone] Office  [phone]  03/02/2016, 3:20 PM

## 2016-03-06 ENCOUNTER — Encounter (HOSPITAL_COMMUNITY): Payer: Self-pay | Admitting: Orthopaedic Surgery

## 2016-03-12 ENCOUNTER — Encounter (HOSPITAL_COMMUNITY): Payer: Self-pay | Admitting: Orthopaedic Surgery

## 2016-03-24 ENCOUNTER — Other Ambulatory Visit (HOSPITAL_COMMUNITY): Payer: Self-pay | Admitting: Psychiatry

## 2016-03-24 ENCOUNTER — Encounter (HOSPITAL_COMMUNITY): Payer: Self-pay | Admitting: Family Medicine

## 2016-03-24 ENCOUNTER — Emergency Department (HOSPITAL_COMMUNITY)
Admission: EM | Admit: 2016-03-24 | Discharge: 2016-03-24 | Disposition: A | Discharge status: Home / Self Care | Payer: Medicare Other | Attending: Emergency Medicine | Admitting: Emergency Medicine

## 2016-03-24 ENCOUNTER — Inpatient Hospital Stay: Admission: AD | Admit: 2016-03-24 | Payer: Medicare Other | Source: Ambulatory Visit | Admitting: Family Medicine

## 2016-03-24 DIAGNOSIS — I252 Old myocardial infarction: Secondary | ICD-10-CM | POA: Diagnosis not present

## 2016-03-24 DIAGNOSIS — Z79899 Other long term (current) drug therapy: Secondary | ICD-10-CM | POA: Insufficient documentation

## 2016-03-24 DIAGNOSIS — F329 Major depressive disorder, single episode, unspecified: Secondary | ICD-10-CM | POA: Diagnosis not present

## 2016-03-24 DIAGNOSIS — R011 Cardiac murmur, unspecified: Secondary | ICD-10-CM | POA: Diagnosis not present

## 2016-03-24 DIAGNOSIS — S8992XA Unspecified injury of left lower leg, initial encounter: Secondary | ICD-10-CM

## 2016-03-24 DIAGNOSIS — M779 Enthesopathy, unspecified: Secondary | ICD-10-CM

## 2016-03-24 DIAGNOSIS — N189 Chronic kidney disease, unspecified: Secondary | ICD-10-CM | POA: Insufficient documentation

## 2016-03-24 DIAGNOSIS — Z862 Personal history of diseases of the blood and blood-forming organs and certain disorders involving the immune mechanism: Secondary | ICD-10-CM | POA: Insufficient documentation

## 2016-03-24 DIAGNOSIS — Z9889 Other specified postprocedural states: Secondary | ICD-10-CM | POA: Diagnosis not present

## 2016-03-24 DIAGNOSIS — Z8719 Personal history of other diseases of the digestive system: Secondary | ICD-10-CM | POA: Diagnosis not present

## 2016-03-24 DIAGNOSIS — Z7982 Long term (current) use of aspirin: Secondary | ICD-10-CM | POA: Insufficient documentation

## 2016-03-24 DIAGNOSIS — Z91040 Latex allergy status: Secondary | ICD-10-CM | POA: Diagnosis not present

## 2016-03-24 DIAGNOSIS — M7981 Nontraumatic hematoma of soft tissue: Secondary | ICD-10-CM | POA: Diagnosis not present

## 2016-03-24 DIAGNOSIS — G8929 Other chronic pain: Secondary | ICD-10-CM | POA: Diagnosis not present

## 2016-03-24 DIAGNOSIS — M13162 Monoarthritis, not elsewhere classified, left knee: Secondary | ICD-10-CM | POA: Insufficient documentation

## 2016-03-24 DIAGNOSIS — Z87891 Personal history of nicotine dependence: Secondary | ICD-10-CM | POA: Diagnosis not present

## 2016-03-24 DIAGNOSIS — I129 Hypertensive chronic kidney disease with stage 1 through stage 4 chronic kidney disease, or unspecified chronic kidney disease: Secondary | ICD-10-CM | POA: Diagnosis not present

## 2016-03-24 DIAGNOSIS — E119 Type 2 diabetes mellitus without complications: Secondary | ICD-10-CM | POA: Diagnosis not present

## 2016-03-24 DIAGNOSIS — M25562 Pain in left knee: Secondary | ICD-10-CM | POA: Diagnosis present

## 2016-03-24 LAB — BASIC METABOLIC PANEL
ANION GAP: 13 (ref 5–15)
BUN: 28 mg/dL — ABNORMAL HIGH (ref 6–20)
CHLORIDE: 102 mmol/L (ref 101–111)
CO2: 22 mmol/L (ref 22–32)
Calcium: 8.9 mg/dL (ref 8.9–10.3)
Creatinine, Ser: 1.15 mg/dL (ref 0.61–1.24)
GFR calc Af Amer: >60 mL/min (ref >60)
GFR, EST NON AFRICAN AMERICAN: 57 mL/min — AB (ref >60)
Glucose, Bld: 106 mg/dL — ABNORMAL HIGH (ref 65–99)
POTASSIUM: 5 mmol/L (ref 3.5–5.1)
SODIUM: 137 mmol/L (ref 135–145)

## 2016-03-24 LAB — CBC WITH DIFFERENTIAL/PLATELET
BASOS ABS: 0 10*3/uL (ref 0.0–0.1)
Basophils Relative: 0 %
EOS PCT: 3 %
Eosinophils Absolute: 0.3 10*3/uL (ref 0.0–0.7)
HEMATOCRIT: 37.6 % — AB (ref 39.0–52.0)
Hemoglobin: 12 g/dL — ABNORMAL LOW (ref 13.0–17.0)
LYMPHS PCT: 12 %
Lymphs Abs: 1.5 10*3/uL (ref 0.7–4.0)
MCH: 31 pg (ref 26.0–34.0)
MCHC: 31.9 g/dL (ref 30.0–36.0)
MCV: 97.2 fL (ref 78.0–100.0)
MONO ABS: 1 10*3/uL (ref 0.1–1.0)
MONOS PCT: 8 %
NEUTROS ABS: 9.3 10*3/uL — AB (ref 1.7–7.7)
NEUTROS PCT: 77 %
PLATELETS: 253 10*3/uL (ref 150–400)
RBC: 3.87 MIL/uL — ABNORMAL LOW (ref 4.22–5.81)
RDW: 13.3 % (ref 11.5–15.5)
WBC: 12.1 10*3/uL — AB (ref 4.0–10.5)

## 2016-03-24 NOTE — Discharge Instructions (Signed)
There does not appear to be an emergent cause for your symptoms at this time. Your exam and labs are very reassuring. Please call [facility] tomorrow to schedule a appointment for reevaluation. Return to ED for any new or worsening symptoms as we discussed.

## 2016-03-24 NOTE — ED Notes (Signed)
PT presents from home via [facility] with c/o left knee pain x3-4 days.  He had a total knee replacement approx 50mo ago and was healing well.  3-4 days ago he walked more than he normally does and his knee became very painful that evening -- He took some hydrocodone with relief of pain.  Knee and lower left leg are erythematous.  PT is A&Ox4 and in NAD.

## 2016-03-24 NOTE — ED Provider Notes (Signed)
CSN: [id number]     Arrival date & time 03/24/16  1411 History   First MD Initiated Contact with Patient 03/24/16 1428     Chief Complaint  Patient presents with  . Knee Pain     (Consider location/radiation/quality/duration/timing/severity/associated sxs/prior Treatment) HPI Jared Bridges is a 82 y.o. male status post total knee replacement roughly 1 month ago comes in for evaluation of painful swollen left knee. Patient reports approximately 3 days ago, he went to [employer] and was riding around in a motorized cart when the carts battery died and he ended up walking around [place] for most of the afternoon. He reports since that time he has had increased redness, swelling, warmth in his left knee. He denies any decreased range of motion, fevers, chest pain, shortness of breath or cough. Took some hydrocodone with some relief of his pain.. No other modifying factors.  Past Medical History  Diagnosis Date  . Diverticulitis   . Syncope   . Hypercholesteremia   . GI bleed     Dr. Laural Golden - 1998  . Peptic ulcer disease   . Anemia, iron deficiency   . Depression   . Peripheral neuropathy ([place])   . Polymyalgia rheumatica ([place])   . Essential hypertension     UNDER CONTROL   . Heart murmur   . Myocardial infarction [facility])     ???? IN COLLEGE   NOTHING FOUND ON CATH ([facility]AVE SHOWS)  . Stroke ([place])     11/2015      TROUBLE READING+ WRITING   . Thyroid nodule   . Type 2 diabetes mellitus ([name]     DIET CONTROLLED   . Chronic kidney disease     RESOLVED  . Chronic headaches     HX  MIGRAINES    Past Surgical History  Procedure Laterality Date  . Uvulectomy  2004    TURBINATE,TONSIL,ADENOID  . Spinal fusion    . Spinal fusion    . Tonsillectomy    . Eye surgery      right cataract with lens implant  . Appendectomy      as child  . Total knee arthroplasty Left 05/28/2015    Procedure: LEFT TOTAL KNEE ARTHROPLASTY;  Surgeon: Gaynelle Arabian, MD;  Location: [facility];  Service:  Orthopedics;  Laterality: Left;  . Irrigation and debridement knee Left 07/09/2015    Procedure: LEFT KNEE IRRIGATION AND DEBRIDEMENT WOUND CLOSURE;  Surgeon: Gaynelle Arabian, MD;  Location: [facility];  Service: Orthopedics;  Laterality: Left;  . Femur surgery      RT LEG        1953  . Hand surgery      RIGHT  . Hernia repair      1-right inguinal, 3- left inguinal  . Colon surgery      FOR DIVERTICULOSIS   1988  . Trigger finger release      RT HAND   1990S  . Back surgery      2000 IDET SPINAL PROC  . Shoulder open rotator cuff repair      RT SHOULDER  . Cryotherapy    . Meniscus repair      LEFT  . Knee debridement      2016   LEFT  . Total knee revision Left 02/28/2016    Procedure: LEFT TOTAL KNEE REVISION;  Surgeon: Leandrew Koyanagi, MD;  Location: [facility];  Service: Orthopedics;  Laterality: Left;   Family History  Problem Relation Age of  Onset  . Hypertension    . Depression Brother   . Depression Daughter   . Colon cancer Sister    Social History  Substance Use Topics  . Smoking status: Former Smoker -- 0.10 packs/day    Types: Cigarettes    Start date: 12/18/1958    Quit date: 12/01/1986  . Smokeless tobacco: Never Used  . Alcohol Use: No    Review of Systems A 10 point review of systems was completed and was negative except for pertinent positives and negatives as mentioned in the history of present illness     Allergies  [place]; Cymbalta; Iodinated diagnostic agents; Iodine; Latex; Lisinopril; Neurontin; Zoloft; Protonix; Valium; Flomax; Glimepiride; Melatonin; and Tape  Home Medications   Prior to Admission medications   Medication Sig Start Date End Date Taking? Authorizing Provider  aspirin EC 325 MG tablet Take 1 tablet (325 mg total) by mouth 2 (two) times daily. 02/28/16   Leandrew Koyanagi, MD  atorvastatin (LIPITOR) 40 MG tablet Take 1 tablet (40 mg total) by mouth daily at 6 PM. 12/20/15   Orvan Falconer, MD  Cholecalciferol (VITAMIN D3) 5000 units CAPS Take 5,000  Units by mouth at bedtime.    Historical Provider, MD  clonazePAM (KLONOPIN) 1 MG tablet Take 1 tablet (1 mg total) by mouth at bedtime. Patient taking differently: Take 1 mg by mouth at bedtime as needed for anxiety.  11/22/15 11/21/16  Asencion Partridge Dohmeier, MD  diclofenac sodium (VOLTAREN) 1 % GEL Apply 2 g topically daily as needed (for pain). 11/22/15   Asencion Partridge Dohmeier, MD  divalproex (DEPAKOTE) 250 MG DR tablet Take 1 tablet (250 mg total) by mouth every morning. 11/22/15   Asencion Partridge Dohmeier, MD  escitalopram (LEXAPRO) 5 MG tablet Take 5 mg by mouth at bedtime. 01/31/16   Historical Provider, MD  fluticasone (FLONASE) 50 MCG/ACT nasal spray Place 1 spray into both nostrils 2 (two) times daily as needed for allergies.  01/07/16   Historical Provider, MD  HYDROcodone-acetaminophen (NORCO/VICODIN) 5-325 MG per tablet Take 1-2 tablets by mouth every 4 (four) hours as needed for moderate pain. Patient taking differently: Take 0.5-1 tablets by mouth every 6 (six) hours as needed for moderate pain. Uses as needed for migraine pain 07/12/15   Arlee Muslim, PA-C  ibuprofen (ADVIL,MOTRIN) 200 MG tablet Take 400 mg by mouth 3 (three) times daily. For osteoarthritis and carpal tunnel    Historical Provider, MD  methocarbamol (ROBAXIN) 750 MG tablet Take 1 tablet (750 mg total) by mouth 2 (two) times daily as needed for muscle spasms. 02/28/16   Naiping Ephriam Jenkins, MD  ondansetron (ZOFRAN) 4 MG tablet Take 1-2 tablets (4-8 mg total) by mouth every 8 (eight) hours as needed for nausea or vomiting. 02/28/16   Leandrew Koyanagi, MD  oxyCODONE (OXY IR/ROXICODONE) 5 MG immediate release tablet Take 1-3 tablets (5-15 mg total) by mouth every 4 (four) hours as needed. 02/28/16   Leandrew Koyanagi, MD  oxyCODONE (OXYCONTIN) 10 mg 12 hr tablet Take 1 tablet (10 mg total) by mouth every 12 (twelve) hours. 02/28/16   Naiping Ephriam Jenkins, MD  predniSONE (DELTASONE) 5 MG tablet Take 5 mg by mouth 2 (two) times a week. Tuesday and Thursday 10/03/14   Historical  Provider, MD  pregabalin (LYRICA) 25 MG capsule Take 25 mg by mouth at bedtime.    Historical Provider, MD  senna-docusate (SENOKOT S) 8.6-50 MG tablet Take 1 tablet by mouth at bedtime as needed. 02/28/16   Naiping M  Erlinda Hong, MD  traMADol (ULTRAM) 50 MG tablet Take 1-2 tablets by mouth every 6 (six) hours as needed for moderate pain.  01/26/16   Historical Provider, MD  venlafaxine XR (EFFEXOR-XR) 150 MG 24 hr capsule Take 1 capsule (150 mg total) by mouth daily. Patient taking differently: Take 75 mg by mouth daily with breakfast.  09/12/15 09/11/16  Cloria Spring, MD   BP 131/62 mmHg  Pulse 86  Temp(Src) 98.3 F (36.8 C) (Oral)  Resp 16  SpO2 98% Physical Exam  Constitutional: He is oriented to person, place, and time. He appears well-developed and well-nourished.  HENT:  Head: Normocephalic and atraumatic.  Mouth/Throat: Oropharynx is clear and moist.  Eyes: Conjunctivae are normal. Pupils are equal, round, and reactive to light. Right eye exhibits no discharge. Left eye exhibits no discharge. No scleral icterus.  Neck: Neck supple.  Cardiovascular: Normal rate, regular rhythm, normal heart sounds and intact distal pulses.   Pulmonary/Chest: Effort normal and breath sounds normal. No respiratory distress. He has no wheezes. He has no rales.  Abdominal: Soft. There is no tenderness.  Musculoskeletal: He exhibits no tenderness.  Left knee with anterior ecchymosis, erythema. Surgical wound appears to be healing well without any drainage. Nontender to palpation around the wound. Full active range of motion of left knee. Distal pulses are intact. See picture for more detail  Neurological: He is alert and oriented to person, place, and time.  Cranial Nerves II-XII grossly intact  Skin: Skin is warm and dry. No rash noted.  Psychiatric: He has a normal mood and affect.  Nursing note and vitals reviewed.       ED Course  Procedures (including critical care time) Labs Review Labs Reviewed   BASIC METABOLIC PANEL - Abnormal; Notable for the following:    Glucose, Bld 106 (*)    BUN 28 (*)    GFR calc non Af Amer 57 (*)    All other components within normal limits  CBC WITH DIFFERENTIAL/PLATELET - Abnormal; Notable for the following:    WBC 12.1 (*)    RBC 3.87 (*)    Hemoglobin 12.0 (*)    HCT 37.6 (*)    Neutro Abs 9.3 (*)    All other components within normal limits    Imaging Review No results found. I have personally reviewed and evaluated these images and lab results as part of my medical decision-making.   EKG Interpretation None     Filed Vitals:   03/24/16 1428 03/24/16 1500 03/24/16 1530 03/24/16 1700  BP: 128/57 132/68 131/60 131/62  Pulse: 70 70 83 86  Temp: 98.3 F (36.8 C)     TempSrc: Oral     Resp: 14  16 16   SpO2: 98% 96% 97% 98%    MDM  Jared Bridges is a 82 y.o. male with recent total knee arthroplasty on 3/30 comes in for evaluation of painful swollen left knee. Symptoms started after walking around [employer] 3 or 4 days ago. No fevers, chills, chest pain or shortness of breath. Maintains full active range of motion of left knee and is neurovascularly intact. Vital signs are stable and he is afebrile. Low suspicion for septic arthritis,Cellulitis, however plan consult orthopedics surgery. Pending screening labs.  CBC shows leukocytosis of 12.1, but this is down trending from previous surgery. Hemoglobin stable at 12.0. Labs otherwise baseline for patient. Discussed with orthopedic surgery, Dr. Sharol Given. He feels it is reasonable to have patient follow-up in the office tomorrow. Discussed  ED course, results, discharge instructions to follow-up with orthopedist tomorrow, patient verbalizes understanding and agrees with this plan as well as subsequent discharge. Prior to patient discharge, I discussed and reviewed this case with Dr.Rees  Final diagnoses:  Knee injury, left, initial encounter  Inflammation around joint        Comer Locket, PA-C 03/24/16 [place], MD 03/26/16 [phone]

## 2016-03-24 NOTE — ED Notes (Signed)
Pedal pulses in L foot confirmed with doppler

## 2016-03-31 ENCOUNTER — Other Ambulatory Visit (HOSPITAL_COMMUNITY): Payer: Self-pay | Admitting: Psychiatry

## 2016-03-31 ENCOUNTER — Telehealth (HOSPITAL_COMMUNITY): Payer: Self-pay | Admitting: *Deleted

## 2016-03-31 MED ORDER — VENLAFAXINE HCL ER 150 MG PO CP24: 150.0000 mg | ORAL_CAPSULE | Freq: Every day | ORAL | Status: DC

## 2016-03-31 NOTE — Telephone Encounter (Signed)
sent 

## 2016-03-31 NOTE — Telephone Encounter (Signed)
noted 

## 2016-03-31 NOTE — Telephone Encounter (Signed)
Pt pharmacy requesting refills for his Venlafaxine ER 75 mg. Pt medication was last filled 09-2015 with 2 refills.

## 2016-04-04 ENCOUNTER — Encounter (HOSPITAL_COMMUNITY): Payer: Self-pay

## 2016-04-04 ENCOUNTER — Inpatient Hospital Stay (HOSPITAL_COMMUNITY)
Admission: EM | Admit: 2016-04-04 | Discharge: 2016-04-11 | DRG: 415 | Disposition: A | Discharge status: Home Health | Payer: Medicare Other | Attending: General Surgery | Admitting: General Surgery

## 2016-04-04 ENCOUNTER — Telehealth (HOSPITAL_COMMUNITY): Payer: Self-pay | Admitting: *Deleted

## 2016-04-04 DIAGNOSIS — Z800 Family history of malignant neoplasm of digestive organs: Secondary | ICD-10-CM

## 2016-04-04 DIAGNOSIS — M353 Polymyalgia rheumatica: Secondary | ICD-10-CM | POA: Diagnosis present

## 2016-04-04 DIAGNOSIS — K810 Acute cholecystitis: Secondary | ICD-10-CM | POA: Diagnosis present

## 2016-04-04 DIAGNOSIS — D62 Acute posthemorrhagic anemia: Secondary | ICD-10-CM | POA: Diagnosis not present

## 2016-04-04 DIAGNOSIS — I252 Old myocardial infarction: Secondary | ICD-10-CM

## 2016-04-04 DIAGNOSIS — Z818 Family history of other mental and behavioral disorders: Secondary | ICD-10-CM

## 2016-04-04 DIAGNOSIS — Z8673 Personal history of transient ischemic attack (TIA), and cerebral infarction without residual deficits: Secondary | ICD-10-CM

## 2016-04-04 DIAGNOSIS — E7800 Pure hypercholesterolemia, unspecified: Secondary | ICD-10-CM | POA: Diagnosis present

## 2016-04-04 DIAGNOSIS — I10 Essential (primary) hypertension: Secondary | ICD-10-CM | POA: Diagnosis present

## 2016-04-04 DIAGNOSIS — R58 Hemorrhage, not elsewhere classified: Secondary | ICD-10-CM | POA: Diagnosis not present

## 2016-04-04 DIAGNOSIS — E1165 Type 2 diabetes mellitus with hyperglycemia: Secondary | ICD-10-CM | POA: Diagnosis present

## 2016-04-04 DIAGNOSIS — K8000 Calculus of gallbladder with acute cholecystitis without obstruction: Secondary | ICD-10-CM | POA: Diagnosis not present

## 2016-04-04 DIAGNOSIS — I775 Necrosis of artery: Secondary | ICD-10-CM | POA: Diagnosis not present

## 2016-04-04 DIAGNOSIS — Z8249 Family history of ischemic heart disease and other diseases of the circulatory system: Secondary | ICD-10-CM

## 2016-04-04 DIAGNOSIS — T819XXA Unspecified complication of procedure, initial encounter: Secondary | ICD-10-CM

## 2016-04-04 DIAGNOSIS — Z7952 Long term (current) use of systemic steroids: Secondary | ICD-10-CM

## 2016-04-04 DIAGNOSIS — R1011 Right upper quadrant pain: Secondary | ICD-10-CM | POA: Diagnosis not present

## 2016-04-04 DIAGNOSIS — Z8711 Personal history of peptic ulcer disease: Secondary | ICD-10-CM

## 2016-04-04 DIAGNOSIS — Z7982 Long term (current) use of aspirin: Secondary | ICD-10-CM

## 2016-04-04 DIAGNOSIS — E785 Hyperlipidemia, unspecified: Secondary | ICD-10-CM | POA: Diagnosis present

## 2016-04-04 DIAGNOSIS — K819 Cholecystitis, unspecified: Secondary | ICD-10-CM

## 2016-04-04 DIAGNOSIS — Z87891 Personal history of nicotine dependence: Secondary | ICD-10-CM

## 2016-04-04 DIAGNOSIS — Z79891 Long term (current) use of opiate analgesic: Secondary | ICD-10-CM

## 2016-04-04 DIAGNOSIS — Z981 Arthrodesis status: Secondary | ICD-10-CM

## 2016-04-04 DIAGNOSIS — I350 Nonrheumatic aortic (valve) stenosis: Secondary | ICD-10-CM | POA: Diagnosis present

## 2016-04-04 DIAGNOSIS — I951 Orthostatic hypotension: Secondary | ICD-10-CM | POA: Diagnosis not present

## 2016-04-04 DIAGNOSIS — R339 Retention of urine, unspecified: Secondary | ICD-10-CM | POA: Diagnosis not present

## 2016-04-04 DIAGNOSIS — Z7901 Long term (current) use of anticoagulants: Secondary | ICD-10-CM

## 2016-04-04 DIAGNOSIS — Z96652 Presence of left artificial knee joint: Secondary | ICD-10-CM | POA: Diagnosis present

## 2016-04-04 DIAGNOSIS — Z7951 Long term (current) use of inhaled steroids: Secondary | ICD-10-CM

## 2016-04-04 DIAGNOSIS — I96 Gangrene, not elsewhere classified: Secondary | ICD-10-CM | POA: Diagnosis present

## 2016-04-04 DIAGNOSIS — Z79899 Other long term (current) drug therapy: Secondary | ICD-10-CM

## 2016-04-04 NOTE — Telephone Encounter (Signed)
noted 

## 2016-04-04 NOTE — ED Notes (Signed)
Pt c/o of constant mid abdominal pain  which goes around to his back, this began at 1600.

## 2016-04-04 NOTE — ED Provider Notes (Signed)
TIME SEEN: 12:03 AM  CHIEF COMPLAINT: Abdominal Pain  HPI: Jared Bridges is a 82 y.o. male with h/o diet controlled DM, HLD, HTN, CVA, diverticulitis s/p colectomy, UTI, GI bleed, and peptic ulcer disease who presents to the Emergency Department complaining of sudden onset, constant, generalized abdominal pain for the last 8 hours. Pt states that the pain radiates to his back. He states that his last bowel movement was 2 days ago. Pt has never experienced similar pain in the past. He reports that his current pain does not feel like diverticulitis. He notes no alleviating or exacerbating factors. He has had a partial colon resection as well as appendectomy. He denies any known cause of his current pain. He further denies hematochezia or melena. Reports he has had nausea but no vomiting. No documented fever. No dysuria, hematuria.  PCP - Anastasio Champion  ROS: See HPI Constitutional: no fever  Eyes: no drainage  ENT: no runny nose   Cardiovascular:  no chest pain  Resp: no SOB  GI: no vomiting GU: no dysuria Integumentary: no rash  Allergy: no hives  Musculoskeletal: no leg swelling  Neurological: no slurred speech ROS otherwise negative  PAST MEDICAL HISTORY/PAST SURGICAL HISTORY:  Past Medical History  Diagnosis Date  . Diverticulitis   . Syncope   . Hypercholesteremia   . GI bleed     Dr. Laural Golden - 1998  . Peptic ulcer disease   . Anemia, iron deficiency   . Depression   . Peripheral neuropathy ([place])   . Polymyalgia rheumatica ([place])   . Essential hypertension     UNDER CONTROL   . Heart murmur   . Myocardial infarction [facility])     ???? IN COLLEGE   NOTHING FOUND ON CATH ([facility]AVE SHOWS)  . Stroke ([place])     11/2015      TROUBLE READING+ WRITING   . Thyroid nodule   . Type 2 diabetes mellitus ([name]     DIET CONTROLLED   . Chronic kidney disease     RESOLVED  . Chronic headaches     HX  MIGRAINES     MEDICATIONS:  Prior to Admission medications   Medication Sig Start Date End  Date Taking? Authorizing Provider  aspirin EC 325 MG tablet Take 1 tablet (325 mg total) by mouth 2 (two) times daily. 02/28/16   Leandrew Koyanagi, MD  atorvastatin (LIPITOR) 40 MG tablet Take 1 tablet (40 mg total) by mouth daily at 6 PM. 12/20/15   Orvan Falconer, MD  Cholecalciferol (VITAMIN D3) 5000 units CAPS Take 5,000 Units by mouth at bedtime.    Historical Provider, MD  clonazePAM (KLONOPIN) 1 MG tablet Take 1 tablet (1 mg total) by mouth at bedtime. Patient taking differently: Take 1 mg by mouth at bedtime as needed for anxiety.  11/22/15 11/21/16  Asencion Partridge Dohmeier, MD  diclofenac sodium (VOLTAREN) 1 % GEL Apply 2 g topically daily as needed (for pain). 11/22/15   Asencion Partridge Dohmeier, MD  divalproex (DEPAKOTE) 250 MG DR tablet Take 1 tablet (250 mg total) by mouth every morning. 11/22/15   Asencion Partridge Dohmeier, MD  escitalopram (LEXAPRO) 5 MG tablet Take 5 mg by mouth at bedtime. 01/31/16   Historical Provider, MD  fluticasone (FLONASE) 50 MCG/ACT nasal spray Place 1 spray into both nostrils 2 (two) times daily as needed for allergies.  01/07/16   Historical Provider, MD  HYDROcodone-acetaminophen (NORCO/VICODIN) 5-325 MG per tablet Take 1-2 tablets by mouth every 4 (four) hours as needed for  moderate pain. Patient taking differently: Take 0.5-1 tablets by mouth every 6 (six) hours as needed for moderate pain. Uses as needed for migraine pain 07/12/15   Arlee Muslim, PA-C  ibuprofen (ADVIL,MOTRIN) 200 MG tablet Take 400 mg by mouth 3 (three) times daily. For osteoarthritis and carpal tunnel    Historical Provider, MD  methocarbamol (ROBAXIN) 750 MG tablet Take 1 tablet (750 mg total) by mouth 2 (two) times daily as needed for muscle spasms. 02/28/16   Naiping Ephriam Jenkins, MD  ondansetron (ZOFRAN) 4 MG tablet Take 1-2 tablets (4-8 mg total) by mouth every 8 (eight) hours as needed for nausea or vomiting. 02/28/16   Leandrew Koyanagi, MD  oxyCODONE (OXY IR/ROXICODONE) 5 MG immediate release tablet Take 1-3 tablets (5-15 mg total) by  mouth every 4 (four) hours as needed. 02/28/16   Leandrew Koyanagi, MD  oxyCODONE (OXYCONTIN) 10 mg 12 hr tablet Take 1 tablet (10 mg total) by mouth every 12 (twelve) hours. 02/28/16   Naiping Ephriam Jenkins, MD  predniSONE (DELTASONE) 5 MG tablet Take 5 mg by mouth 2 (two) times a week. Tuesday and Thursday 10/03/14   Historical Provider, MD  pregabalin (LYRICA) 25 MG capsule Take 25 mg by mouth at bedtime.    Historical Provider, MD  senna-docusate (SENOKOT S) 8.6-50 MG tablet Take 1 tablet by mouth at bedtime as needed. 02/28/16   Leandrew Koyanagi, MD  traMADol (ULTRAM) 50 MG tablet Take 1-2 tablets by mouth every 6 (six) hours as needed for moderate pain.  01/26/16   Historical Provider, MD  venlafaxine XR (EFFEXOR-XR) 150 MG 24 hr capsule Take 1 capsule (150 mg total) by mouth daily. 03/31/16 03/31/17  Cloria Spring, MD    ALLERGIES:  Allergies  Allergen Reactions  . Calan [Verapamil] Other (See Comments)    Weakness  . Cymbalta [Duloxetine Hcl] Other (See Comments)    Makes patient have jerking motions.  . Iodinated Diagnostic Agents Hives and Itching    Can pre-med with benadryl  . Iodine Hives and Itching    very allergic(per daughter), can pre-med with benadryl  . Latex Other (See Comments)    Redness iritation  . Lisinopril Other (See Comments)    Makes patient have jerking motions.  . Neurontin [Gabapentin] Other (See Comments)    Makes patient have jerking motions.  [name] Kitchen Zoloft [Sertraline Hcl] Diarrhea and Other (See Comments)    Makes patient have jerking motions.  . Protonix [Pantoprazole Sodium] Other (See Comments)    "Did not work"  . Valium [Diazepam] Other (See Comments)    "Did not work"  . Flomax [Tamsulosin Hcl] Other (See Comments)    Dizziness  . Glimepiride Other (See Comments)    "Did not work"  . Melatonin Nausea Only  . Tape Other (See Comments)    Skin irritation    SOCIAL HISTORY:  Social History  Substance Use Topics  . Smoking status: Former Smoker -- 0.10 packs/day     Types: Cigarettes    Start date: 12/18/1958    Quit date: 12/01/1986  . Smokeless tobacco: Never Used  . Alcohol Use: No    FAMILY HISTORY: Family History  Problem Relation Age of Onset  . Hypertension    . Depression Brother   . Depression Daughter   . Colon cancer Sister     EXAM: BP 163/70 mmHg  Pulse 67  Temp(Src) 98.5 F (36.9 C) (Oral)  Resp 18  Ht 6\' 2"  (1.88 m)  Wt 214  lb (97.07 kg)  BMI 27.46 kg/m2  SpO2 96% CONSTITUTIONAL: Alert and oriented and responds appropriately to questions. Chronically ill-appearing; well-nourished; elderly; afebrile but appears uncomfortable HEAD: Normocephalic EYES: Conjunctivae clear, PERRL ENT: normal nose; no rhinorrhea; moist mucous membranes NECK: Supple, no meningismus, no LAD  CARD: RRR; S1 and S2 appreciated; no murmurs, no clicks, no rubs, no gallops RESP: Normal chest excursion without splinting or tachypnea; breath sounds clear and equal bilaterally; no wheezes, no rhonchi, no rales, no hypoxia or respiratory distress, speaking full sentences ABD/GI: hyperactive bowel sounds; non-distended; soft, diffusely tender throughout the abdomen worse in the upper abdomen and around the umbilicus; voluntary guarding, no rebound, no peritoneal signs, no tympany; negative Murphys sign BACK:  The back appears normal and is non-tender to palpation, there is no CVA tenderness EXT: Normal ROM in all joints; non-tender to palpation; no edema; normal capillary refill; no cyanosis, no calf tenderness or swelling; surgical scar that is healed to the anterior R knee; ecchymosis and small amount of swelling to the right knee from TKA; 2+ DP pulses bilaterally, no erythema or warmth SKIN: Normal color for age and race; warm; no rash NEURO: Moves all extremities equally, sensation to light touch intact diffusely, cranial nerves II through XII intact PSYCH: The patient's mood and manner are appropriate. Grooming and personal hygiene are  appropriate.  MEDICAL DECISION MAKING: Patient with diffuse abdominal pain. Differential diagnosis includes cholecystitis, pancreatitis, bowel obstruction, UTI. Will obtain labs, urine, CT of his abdomen and pelvis. Will give IV fluids, pain and nausea medication.  ED PROGRESS: 1:00 AM  Patient's labs show mild leukocytosis of 12.7 with left shift. Urine shows small amount of blood and few bacteria but no other sign of infection. Lactate is normal. CT scan pending.  Patient reports no significant pain improvement after morphine but is now almost pain-free after a dose of IV Dilaudid.  3:45 AM  Pt's CT scan shows acute cholecystitis. Discussed with Dr. Arnoldo Morale on call for general surgery. He recommended patient Zosyn and asked for medicine admission. He will see the patient in the morning and consult. We will keep the patient NPO and continue IV hydration. He has had some slight drop in his oxygen saturation after pain medication and while asleep. This quickly comes up when he is awake and talking.  He reports his pain is still very well-controlled after 1 dose of IV Dilaudid.  Patient and family updated with plan.  4:00 AM  Discussed patient's case with hospitalist, Dr. Darrick Meigs.  Recommend admission to medical, observation bed.  I will place holding orders per their request. Patient and family (if present) updated with plan. Care transferred to hospitalist service.  I reviewed all nursing notes, vitals, pertinent old records, EKGs, labs, imaging (as available).   [place], DO 04/05/16 [phone]

## 2016-04-04 NOTE — Telephone Encounter (Signed)
Pt daughter called stating she is confused as to how pt was switched to 150 mg Effexor. Per pt daughter, she thought Dr. Anastasio Champion (PCP) put pt on 75 mg Effexor. Informed pt daughter that a refill request was received from pharmacy requesting the 75 mg and noticed that pt was on 150 mg with this office. Informed pt daughter that per pt chart, pt is on 150 mg Effexor but daughter is still confused as to when and how our office don't have him on 75 mg. Per pt, daughter, she's not sure how they got the 150 mg from this office. Per pt daughter, she just want to make sure that Dr. Harrington Challenger is aware that pt is currently on 75 mg Effexor and is doing well and is also on 5 mg Lexapro.

## 2016-04-05 ENCOUNTER — Inpatient Hospital Stay (HOSPITAL_COMMUNITY): Payer: Medicare Other

## 2016-04-05 ENCOUNTER — Encounter (HOSPITAL_COMMUNITY): Payer: Self-pay | Admitting: *Deleted

## 2016-04-05 ENCOUNTER — Emergency Department (HOSPITAL_COMMUNITY): Payer: Medicare Other

## 2016-04-05 DIAGNOSIS — Z8249 Family history of ischemic heart disease and other diseases of the circulatory system: Secondary | ICD-10-CM | POA: Diagnosis not present

## 2016-04-05 DIAGNOSIS — Z800 Family history of malignant neoplasm of digestive organs: Secondary | ICD-10-CM | POA: Diagnosis not present

## 2016-04-05 DIAGNOSIS — Z8711 Personal history of peptic ulcer disease: Secondary | ICD-10-CM | POA: Diagnosis not present

## 2016-04-05 DIAGNOSIS — R338 Other retention of urine: Secondary | ICD-10-CM | POA: Diagnosis not present

## 2016-04-05 DIAGNOSIS — I951 Orthostatic hypotension: Secondary | ICD-10-CM | POA: Diagnosis not present

## 2016-04-05 DIAGNOSIS — I96 Gangrene, not elsewhere classified: Secondary | ICD-10-CM | POA: Diagnosis present

## 2016-04-05 DIAGNOSIS — Z87891 Personal history of nicotine dependence: Secondary | ICD-10-CM | POA: Diagnosis not present

## 2016-04-05 DIAGNOSIS — Z96652 Presence of left artificial knee joint: Secondary | ICD-10-CM | POA: Diagnosis present

## 2016-04-05 DIAGNOSIS — E7800 Pure hypercholesterolemia, unspecified: Secondary | ICD-10-CM | POA: Diagnosis present

## 2016-04-05 DIAGNOSIS — Z7982 Long term (current) use of aspirin: Secondary | ICD-10-CM | POA: Diagnosis not present

## 2016-04-05 DIAGNOSIS — I10 Essential (primary) hypertension: Secondary | ICD-10-CM | POA: Diagnosis present

## 2016-04-05 DIAGNOSIS — R339 Retention of urine, unspecified: Secondary | ICD-10-CM | POA: Diagnosis not present

## 2016-04-05 DIAGNOSIS — E1165 Type 2 diabetes mellitus with hyperglycemia: Secondary | ICD-10-CM | POA: Diagnosis present

## 2016-04-05 DIAGNOSIS — I252 Old myocardial infarction: Secondary | ICD-10-CM | POA: Diagnosis not present

## 2016-04-05 DIAGNOSIS — K8000 Calculus of gallbladder with acute cholecystitis without obstruction: Secondary | ICD-10-CM | POA: Diagnosis present

## 2016-04-05 DIAGNOSIS — R031 Nonspecific low blood-pressure reading: Secondary | ICD-10-CM | POA: Diagnosis not present

## 2016-04-05 DIAGNOSIS — R58 Hemorrhage, not elsewhere classified: Secondary | ICD-10-CM | POA: Diagnosis not present

## 2016-04-05 DIAGNOSIS — I775 Necrosis of artery: Secondary | ICD-10-CM | POA: Diagnosis not present

## 2016-04-05 DIAGNOSIS — Z981 Arthrodesis status: Secondary | ICD-10-CM | POA: Diagnosis not present

## 2016-04-05 DIAGNOSIS — Z79891 Long term (current) use of opiate analgesic: Secondary | ICD-10-CM | POA: Diagnosis not present

## 2016-04-05 DIAGNOSIS — I350 Nonrheumatic aortic (valve) stenosis: Secondary | ICD-10-CM | POA: Diagnosis present

## 2016-04-05 DIAGNOSIS — M353 Polymyalgia rheumatica: Secondary | ICD-10-CM | POA: Diagnosis present

## 2016-04-05 DIAGNOSIS — Z818 Family history of other mental and behavioral disorders: Secondary | ICD-10-CM | POA: Diagnosis not present

## 2016-04-05 DIAGNOSIS — K810 Acute cholecystitis: Secondary | ICD-10-CM

## 2016-04-05 DIAGNOSIS — Z7951 Long term (current) use of inhaled steroids: Secondary | ICD-10-CM | POA: Diagnosis not present

## 2016-04-05 DIAGNOSIS — Z8673 Personal history of transient ischemic attack (TIA), and cerebral infarction without residual deficits: Secondary | ICD-10-CM | POA: Diagnosis not present

## 2016-04-05 DIAGNOSIS — R1011 Right upper quadrant pain: Secondary | ICD-10-CM | POA: Diagnosis present

## 2016-04-05 DIAGNOSIS — Z7901 Long term (current) use of anticoagulants: Secondary | ICD-10-CM | POA: Diagnosis not present

## 2016-04-05 DIAGNOSIS — G43809 Other migraine, not intractable, without status migrainosus: Secondary | ICD-10-CM | POA: Diagnosis not present

## 2016-04-05 DIAGNOSIS — E785 Hyperlipidemia, unspecified: Secondary | ICD-10-CM | POA: Diagnosis present

## 2016-04-05 DIAGNOSIS — Z7952 Long term (current) use of systemic steroids: Secondary | ICD-10-CM | POA: Diagnosis not present

## 2016-04-05 DIAGNOSIS — Z79899 Other long term (current) drug therapy: Secondary | ICD-10-CM | POA: Diagnosis not present

## 2016-04-05 DIAGNOSIS — D62 Acute posthemorrhagic anemia: Secondary | ICD-10-CM | POA: Diagnosis not present

## 2016-04-05 HISTORY — DX: Acute cholecystitis: K81.0

## 2016-04-05 LAB — COMPREHENSIVE METABOLIC PANEL
ALBUMIN: 3.5 g/dL (ref 3.5–5.0)
ALT: 16 U/L — ABNORMAL LOW (ref 17–63)
ALT: 19 U/L (ref 17–63)
ANION GAP: 10 (ref 5–15)
ANION GAP: 9 (ref 5–15)
AST: 22 U/L (ref 15–41)
AST: 24 U/L (ref 15–41)
Albumin: 3.4 g/dL — ABNORMAL LOW (ref 3.5–5.0)
Alkaline Phosphatase: 67 U/L (ref 38–126)
Alkaline Phosphatase: 74 U/L (ref 38–126)
BILIRUBIN TOTAL: 1 mg/dL (ref 0.3–1.2)
BUN: 17 mg/dL (ref 6–20)
BUN: 18 mg/dL (ref 6–20)
CALCIUM: 8.4 mg/dL — AB (ref 8.9–10.3)
CHLORIDE: 99 mmol/L — AB (ref 101–111)
CO2: 27 mmol/L (ref 22–32)
CO2: 29 mmol/L (ref 22–32)
Calcium: 8.5 mg/dL — ABNORMAL LOW (ref 8.9–10.3)
Chloride: 101 mmol/L (ref 101–111)
Creatinine, Ser: 0.93 mg/dL (ref 0.61–1.24)
Creatinine, Ser: 0.95 mg/dL (ref 0.61–1.24)
GFR calc Af Amer: >60 mL/min (ref >60)
GFR calc non Af Amer: >60 mL/min (ref >60)
GLUCOSE: 213 mg/dL — AB (ref 65–99)
Glucose, Bld: 185 mg/dL — ABNORMAL HIGH (ref 65–99)
POTASSIUM: 3.5 mmol/L (ref 3.5–5.1)
POTASSIUM: 3.6 mmol/L (ref 3.5–5.1)
SODIUM: 137 mmol/L (ref 135–145)
Sodium: 138 mmol/L (ref 135–145)
TOTAL PROTEIN: 6.6 g/dL (ref 6.5–8.1)
Total Bilirubin: 0.8 mg/dL (ref 0.3–1.2)
Total Protein: 6.6 g/dL (ref 6.5–8.1)

## 2016-04-05 LAB — LACTIC ACID, PLASMA: Lactic Acid, Venous: 1.9 mmol/L (ref 0.5–2.0)

## 2016-04-05 LAB — CBC WITH DIFFERENTIAL/PLATELET
BASOS ABS: 0 10*3/uL (ref 0.0–0.1)
Basophils Relative: 0 %
Eosinophils Absolute: 0 10*3/uL (ref 0.0–0.7)
Eosinophils Relative: 0 %
HEMATOCRIT: 35.4 % — AB (ref 39.0–52.0)
Hemoglobin: 11.6 g/dL — ABNORMAL LOW (ref 13.0–17.0)
LYMPHS ABS: 0.6 10*3/uL — AB (ref 0.7–4.0)
LYMPHS PCT: 5 %
MCH: 31.4 pg (ref 26.0–34.0)
MCHC: 32.8 g/dL (ref 30.0–36.0)
MCV: 95.9 fL (ref 78.0–100.0)
MONO ABS: 1.1 10*3/uL — AB (ref 0.1–1.0)
Monocytes Relative: 9 %
Neutro Abs: 11 10*3/uL — ABNORMAL HIGH (ref 1.7–7.7)
Neutrophils Relative %: 86 %
PLATELETS: 227 10*3/uL (ref 150–400)
RBC: 3.69 MIL/uL — ABNORMAL LOW (ref 4.22–5.81)
RDW: 13.1 % (ref 11.5–15.5)
WBC: 12.7 10*3/uL — ABNORMAL HIGH (ref 4.0–10.5)

## 2016-04-05 LAB — CBC
HEMATOCRIT: 36.1 % — AB (ref 39.0–52.0)
Hemoglobin: 11.6 g/dL — ABNORMAL LOW (ref 13.0–17.0)
MCH: 30.9 pg (ref 26.0–34.0)
MCHC: 32.1 g/dL (ref 30.0–36.0)
MCV: 96 fL (ref 78.0–100.0)
Platelets: 233 10*3/uL (ref 150–400)
RBC: 3.76 MIL/uL — ABNORMAL LOW (ref 4.22–5.81)
RDW: 13.3 % (ref 11.5–15.5)
WBC: 25.4 10*3/uL — ABNORMAL HIGH (ref 4.0–10.5)

## 2016-04-05 LAB — GLUCOSE, CAPILLARY
Glucose-Capillary: 163 mg/dL — ABNORMAL HIGH (ref 65–99)
Glucose-Capillary: 153 mg/dL — ABNORMAL HIGH (ref 65–99)
Glucose-Capillary: 128 mg/dL — ABNORMAL HIGH (ref 65–99)
Glucose-Capillary: 155 mg/dL — ABNORMAL HIGH (ref 65–99)

## 2016-04-05 LAB — URINALYSIS, ROUTINE W REFLEX MICROSCOPIC
Bilirubin Urine: NEGATIVE
GLUCOSE, UA: NEGATIVE mg/dL
Ketones, ur: 15 mg/dL — AB
LEUKOCYTES UA: NEGATIVE
NITRITE: NEGATIVE
PH: 8 (ref 5.0–8.0)
Specific Gravity, Urine: 1.015 (ref 1.005–1.030)

## 2016-04-05 LAB — URINE MICROSCOPIC-ADD ON

## 2016-04-05 LAB — LIPASE, BLOOD: Lipase: 14 U/L (ref 11–51)

## 2016-04-05 LAB — PROTIME-INR
INR: 1.29 (ref 0.00–1.49)
PROTHROMBIN TIME: 16.3 s — AB (ref 11.6–15.2)

## 2016-04-05 MED ORDER — INSULIN ASPART 100 UNIT/ML ~~LOC~~ SOLN
0.0000 [IU] | Freq: Three times a day (TID) | SUBCUTANEOUS | Status: DC
  Administered 2016-04-05: 2 [IU] via SUBCUTANEOUS
  Administered 2016-04-05: 1 [IU] via SUBCUTANEOUS
  Administered 2016-04-05 – 2016-04-06 (×2): 2 [IU] via SUBCUTANEOUS
  Administered 2016-04-06: 1 [IU] via SUBCUTANEOUS
  Administered 2016-04-06 – 2016-04-08 (×3): 2 [IU] via SUBCUTANEOUS
  Administered 2016-04-08: 3 [IU] via SUBCUTANEOUS
  Administered 2016-04-08: 2 [IU] via SUBCUTANEOUS
  Administered 2016-04-09: 1 [IU] via SUBCUTANEOUS
  Administered 2016-04-09 (×2): 2 [IU] via SUBCUTANEOUS
  Administered 2016-04-10: 1 [IU] via SUBCUTANEOUS
  Administered 2016-04-10 (×2): 2 [IU] via SUBCUTANEOUS
  Administered 2016-04-11: 1 [IU] via SUBCUTANEOUS
  Administered 2016-04-11: 3 [IU] via SUBCUTANEOUS

## 2016-04-05 MED ORDER — FAMOTIDINE IN NACL 20-0.9 MG/50ML-% IV SOLN
20.0000 mg | INTRAVENOUS | Status: DC
  Administered 2016-04-05 – 2016-04-06 (×2): 20 mg via INTRAVENOUS
  Filled 2016-04-05 (×4): qty 50

## 2016-04-05 MED ORDER — MORPHINE SULFATE (PF) 2 MG/ML IV SOLN
2.0000 mg | INTRAVENOUS | Status: DC | PRN
  Administered 2016-04-05 – 2016-04-08 (×6): 2 mg via INTRAVENOUS
  Filled 2016-04-05 (×7): qty 1

## 2016-04-05 MED ORDER — CLONAZEPAM 0.5 MG PO TABS
1.0000 mg | ORAL_TABLET | Freq: Every evening | ORAL | Status: DC | PRN
  Administered 2016-04-08 – 2016-04-10 (×2): 1 mg via ORAL
  Filled 2016-04-05 (×2): qty 2

## 2016-04-05 MED ORDER — SODIUM CHLORIDE 0.9 % IV BOLUS (SEPSIS)
1000.0000 mL | Freq: Once | INTRAVENOUS | Status: AC
  Administered 2016-04-05: 1000 mL via INTRAVENOUS

## 2016-04-05 MED ORDER — ACETAMINOPHEN 500 MG PO TABS
1000.0000 mg | ORAL_TABLET | Freq: Once | ORAL | Status: AC
  Administered 2016-04-05: 1000 mg via ORAL
  Filled 2016-04-05: qty 2

## 2016-04-05 MED ORDER — SODIUM CHLORIDE 0.9 % IV SOLN
INTRAVENOUS | Status: DC
  Administered 2016-04-05: 06:00:00 via INTRAVENOUS

## 2016-04-05 MED ORDER — DIATRIZOATE MEGLUMINE & SODIUM 66-10 % PO SOLN
ORAL | Status: AC
  Administered 2016-04-05: 03:00:00
  Filled 2016-04-05: qty 30

## 2016-04-05 MED ORDER — KCL IN DEXTROSE-NACL 20-5-0.45 MEQ/L-%-% IV SOLN
INTRAVENOUS | Status: DC
  Administered 2016-04-05 – 2016-04-06 (×2): via INTRAVENOUS

## 2016-04-05 MED ORDER — ATORVASTATIN CALCIUM 40 MG PO TABS
40.0000 mg | ORAL_TABLET | Freq: Every day | ORAL | Status: DC
  Administered 2016-04-05 – 2016-04-06 (×2): 40 mg via ORAL
  Filled 2016-04-05 (×3): qty 1

## 2016-04-05 MED ORDER — HYDROMORPHONE HCL 1 MG/ML IJ SOLN
1.0000 mg | Freq: Once | INTRAMUSCULAR | Status: AC
  Administered 2016-04-05: 1 mg via INTRAVENOUS
  Filled 2016-04-05: qty 1

## 2016-04-05 MED ORDER — MORPHINE SULFATE (PF) 4 MG/ML IV SOLN
4.0000 mg | Freq: Once | INTRAVENOUS | Status: AC
  Administered 2016-04-05: 4 mg via INTRAVENOUS
  Filled 2016-04-05: qty 1

## 2016-04-05 MED ORDER — VANCOMYCIN HCL 10 G IV SOLR
1500.0000 mg | Freq: Once | INTRAVENOUS | Status: AC
  Administered 2016-04-05: 1500 mg via INTRAVENOUS
  Filled 2016-04-05: qty 1500

## 2016-04-05 MED ORDER — VANCOMYCIN HCL 10 G IV SOLR
1250.0000 mg | Freq: Two times a day (BID) | INTRAVENOUS | Status: DC
  Administered 2016-04-05 – 2016-04-08 (×4): 1250 mg via INTRAVENOUS
  Filled 2016-04-05 (×6): qty 1250

## 2016-04-05 MED ORDER — ONDANSETRON HCL 4 MG/2ML IJ SOLN
4.0000 mg | Freq: Once | INTRAMUSCULAR | Status: AC
  Administered 2016-04-05: 4 mg via INTRAVENOUS
  Filled 2016-04-05: qty 2

## 2016-04-05 MED ORDER — MORPHINE SULFATE (PF) 2 MG/ML IV SOLN: 2.0000 mg | INTRAVENOUS | Status: DC | PRN

## 2016-04-05 MED ORDER — PIPERACILLIN-TAZOBACTAM 3.375 G IVPB
3.3750 g | Freq: Three times a day (TID) | INTRAVENOUS | Status: DC
  Administered 2016-04-05 – 2016-04-11 (×17): 3.375 g via INTRAVENOUS
  Filled 2016-04-05 (×16): qty 50

## 2016-04-05 MED ORDER — PREGABALIN 25 MG PO CAPS
25.0000 mg | ORAL_CAPSULE | Freq: Every day | ORAL | Status: DC
  Administered 2016-04-05 – 2016-04-10 (×6): 25 mg via ORAL
  Filled 2016-04-05 (×6): qty 1

## 2016-04-05 MED ORDER — PIPERACILLIN-TAZOBACTAM 3.375 G IVPB
3.3750 g | Freq: Once | INTRAVENOUS | Status: AC
  Administered 2016-04-05: 3.375 g via INTRAVENOUS
  Filled 2016-04-05: qty 50

## 2016-04-05 MED ORDER — VENLAFAXINE HCL ER 75 MG PO CP24
150.0000 mg | ORAL_CAPSULE | Freq: Every day | ORAL | Status: DC
  Filled 2016-04-05: qty 2

## 2016-04-05 MED ORDER — ONDANSETRON HCL 4 MG PO TABS: 4.0000 mg | ORAL_TABLET | Freq: Four times a day (QID) | ORAL | Status: DC | PRN

## 2016-04-05 MED ORDER — VENLAFAXINE HCL ER 75 MG PO CP24
75.0000 mg | ORAL_CAPSULE | Freq: Every day | ORAL | Status: DC
  Administered 2016-04-05 – 2016-04-11 (×6): 75 mg via ORAL
  Filled 2016-04-05 (×5): qty 1

## 2016-04-05 MED ORDER — ONDANSETRON HCL 4 MG/2ML IJ SOLN
4.0000 mg | Freq: Four times a day (QID) | INTRAMUSCULAR | Status: DC | PRN
  Administered 2016-04-06: 4 mg via INTRAVENOUS
  Filled 2016-04-05: qty 2

## 2016-04-05 MED ORDER — SODIUM CHLORIDE 0.9 % IV SOLN: INTRAVENOUS | Status: DC

## 2016-04-05 MED ORDER — DIVALPROEX SODIUM 250 MG PO DR TAB
250.0000 mg | DELAYED_RELEASE_TABLET | Freq: Every morning | ORAL | Status: DC
  Administered 2016-04-05 – 2016-04-11 (×6): 250 mg via ORAL
  Filled 2016-04-05 (×6): qty 1

## 2016-04-05 NOTE — Consult Note (Signed)
Reason for Consult: Cholecystitis Referring Physician: Dr. Orson Gear Jared Bridges is an 82 y.o. male.  HPI: Patient is an 82 year old white male who presented to [facility] with a several-day history of worsening upper abdominal pain and nausea. He states that he has had ulcer disease in the past, but this was different than his previous episodes. He denies any fever, chills, or jaundice. Appetite was decreased. He also had some generalized malaise. CT scan of the abdomen in the emergency room revealed cholecystitis with a thickened gallbladder wall and sludge present. Bile duct. Be within normal limits. He also history of diverticulosis. He was admitted to the hospital for further evaluation and treatment. He states he is still having some pain which is somewhat relieved with morphine. He has had no emesis since being here. Of significance is the fact that he had a revision of his left knee in March 2017 for poor wound healing and infection.  Past Medical History  Diagnosis Date  . Diverticulitis   . Syncope   . Hypercholesteremia   . GI bleed     Dr. Laural Golden - 1998  . Peptic ulcer disease   . Anemia, iron deficiency   . Depression   . Peripheral neuropathy ([place])   . Polymyalgia rheumatica ([place])   . Essential hypertension     UNDER CONTROL   . Heart murmur   . Myocardial infarction [facility])     ???? IN COLLEGE   NOTHING FOUND ON CATH (Q WAVE SHOWS)  . Stroke ([place])     11/2015      TROUBLE READING+ WRITING   . Thyroid nodule   . Type 2 diabetes mellitus ([name]     DIET CONTROLLED   . Chronic kidney disease     RESOLVED  . Chronic headaches     HX  MIGRAINES     Past Surgical History  Procedure Laterality Date  . Uvulectomy  2004    TURBINATE,TONSIL,ADENOID  . Spinal fusion    . Spinal fusion    . Tonsillectomy    . Eye surgery      right cataract with lens implant  . Appendectomy      as child  . Total knee arthroplasty Left 05/28/2015    Procedure: LEFT TOTAL KNEE  ARTHROPLASTY;  Surgeon: Gaynelle Arabian, MD;  Location: [facility];  Service: Orthopedics;  Laterality: Left;  . Irrigation and debridement knee Left 07/09/2015    Procedure: LEFT KNEE IRRIGATION AND DEBRIDEMENT WOUND CLOSURE;  Surgeon: Gaynelle Arabian, MD;  Location: [facility];  Service: Orthopedics;  Laterality: Left;  . Femur surgery      RT LEG        1953  . Hand surgery      RIGHT  . Hernia repair      1-right inguinal, 3- left inguinal  . Colon surgery      FOR DIVERTICULOSIS   1988  . Trigger finger release      RT HAND   1990S  . Back surgery      2000 IDET SPINAL PROC  . Shoulder open rotator cuff repair      RT SHOULDER  . Cryotherapy    . Meniscus repair      LEFT  . Knee debridement      2016   LEFT  . Total knee revision Left 02/28/2016    Procedure: LEFT TOTAL KNEE REVISION;  Surgeon: Leandrew Koyanagi, MD;  Location: [facility];  Service: Orthopedics;  Laterality:  Left;    Family History  Problem Relation Age of Onset  . Hypertension    . Depression Brother   . Depression Daughter   . Colon cancer Sister     Social History:  reports that he quit smoking about 29 years ago. His smoking use included Cigarettes. He started smoking about 57 years ago. He smoked 0.10 packs per day. He has never used smokeless tobacco. He reports that he does not drink alcohol or use illicit drugs.  Allergies:  Allergies  Allergen Reactions  . Calan [Verapamil] Other (See Comments)    Weakness  . Cymbalta [Duloxetine Hcl] Other (See Comments)    Makes patient have jerking motions.  . Iodinated Diagnostic Agents Hives and Itching    Can pre-med with benadryl  . Iodine Hives and Itching    very allergic(per daughter), can pre-med with benadryl  . Latex Other (See Comments)    Redness iritation  . Lisinopril Other (See Comments)    Makes patient have jerking motions.  . Neurontin [Gabapentin] Other (See Comments)    Makes patient have jerking motions.  [name] Kitchen Zoloft [Sertraline Hcl] Diarrhea and Other  (See Comments)    Makes patient have jerking motions.  . Protonix [Pantoprazole Sodium] Other (See Comments)    "Did not work"  . Valium [Diazepam] Other (See Comments)    "Did not work"  . Flomax [Tamsulosin Hcl] Other (See Comments)    Dizziness  . Glimepiride Other (See Comments)    "Did not work"  . Melatonin Nausea Only  . Tape Other (See Comments)    Skin irritation    Medications:  Prior to Admission:  Prescriptions prior to admission  Medication Sig Dispense Refill Last Dose  . atorvastatin (LIPITOR) 40 MG tablet Take 1 tablet (40 mg total) by mouth daily at 6 PM. 30 tablet 1 04/04/2016 at Unknown time  . Cholecalciferol (VITAMIN D3) 5000 units CAPS Take 5,000 Units by mouth at bedtime.   04/04/2016 at Unknown time  . clonazePAM (KLONOPIN) 1 MG tablet Take 1 tablet (1 mg total) by mouth at bedtime. (Patient taking differently: Take 1 mg by mouth at bedtime as needed for anxiety. ) 30 tablet 2 04/04/2016 at Unknown time  . diclofenac sodium (VOLTAREN) 1 % GEL Apply 2 g topically daily as needed (for pain). 2 g 1 unknown at Unknown time  . divalproex (DEPAKOTE) 250 MG DR tablet Take 1 tablet (250 mg total) by mouth every morning. 30 tablet 5 04/04/2016 at Unknown time  . escitalopram (LEXAPRO) 5 MG tablet Take 5 mg by mouth at bedtime.  1 04/04/2016 at Unknown time  . fluticasone (FLONASE) 50 MCG/ACT nasal spray Place 1 spray into both nostrils 2 (two) times daily as needed for allergies.   1 unknown  . HYDROcodone-acetaminophen (NORCO/VICODIN) 5-325 MG per tablet Take 1-2 tablets by mouth every 4 (four) hours as needed for moderate pain. (Patient taking differently: Take 0.5-1 tablets by mouth every 6 (six) hours as needed for moderate pain. Uses as needed for migraine pain) 80 tablet 0 unknown  . methocarbamol (ROBAXIN) 750 MG tablet Take 1 tablet (750 mg total) by mouth 2 (two) times daily as needed for muscle spasms. 60 tablet 0 unknown  . oxyCODONE (OXY IR/ROXICODONE) 5 MG immediate  release tablet Take 1-3 tablets (5-15 mg total) by mouth every 4 (four) hours as needed. (Patient taking differently: Take 5-15 mg by mouth every 4 (four) hours as needed for moderate pain. ) 90 tablet 0 unknown  .  oxyCODONE (OXYCONTIN) 10 mg 12 hr tablet Take 1 tablet (10 mg total) by mouth every 12 (twelve) hours. (Patient taking differently: Take 10 mg by mouth 2 (two) times daily as needed (post surgery knee pain). ) 10 tablet 0 unknown  . predniSONE (DELTASONE) 5 MG tablet Take 5 mg by mouth 2 (two) times a week. Tuesday and Thursday  1 04/04/2016 at Unknown time  . pregabalin (LYRICA) 25 MG capsule Take 25 mg by mouth at bedtime.   04/04/2016 at Unknown time  . traMADol (ULTRAM) 50 MG tablet Take 1-2 tablets by mouth every 6 (six) hours as needed for moderate pain.   0 04/04/2016 at Unknown time  . venlafaxine XR (EFFEXOR-XR) 75 MG 24 hr capsule Take 1 capsule by mouth daily.  0 04/04/2016 at Unknown time  . XARELTO 10 MG TABS tablet Take 1 tablet by mouth daily.  0 04/04/2016 at 12 NOON  . aspirin EC 325 MG tablet Take 1 tablet (325 mg total) by mouth 2 (two) times daily. (Patient not taking: Reported on 04/05/2016) 84 tablet 0 Not Taking at Unknown time  . ondansetron (ZOFRAN) 4 MG tablet Take 1-2 tablets (4-8 mg total) by mouth every 8 (eight) hours as needed for nausea or vomiting. (Patient not taking: Reported on 04/05/2016) 40 tablet 0 Not Taking at Unknown time  . senna-docusate (SENOKOT S) 8.6-50 MG tablet Take 1 tablet by mouth at bedtime as needed. (Patient not taking: Reported on 04/05/2016) 30 tablet 1 Not Taking at Unknown time  . venlafaxine XR (EFFEXOR-XR) 150 MG 24 hr capsule Take 1 capsule (150 mg total) by mouth daily. (Patient not taking: Reported on 04/05/2016) 30 capsule 2 Not Taking at Unknown time   Scheduled: . atorvastatin  40 mg Oral q1800  . divalproex  250 mg Oral q morning - 10a  . famotidine (PEPCID) IV  20 mg Intravenous Q24H  . insulin aspart  0-9 Units Subcutaneous TID WC  .  piperacillin-tazobactam (ZOSYN)  IV  3.375 g Intravenous Q8H  . pregabalin  25 mg Oral QHS  . vancomycin  1,250 mg Intravenous Q12H  . vancomycin  1,500 mg Intravenous Once  . [START ON 04/06/2016] venlafaxine XR  75 mg Oral Daily    Results for orders placed or performed during the hospital encounter of 04/04/16 (from the past 48 hour(s))  Urinalysis, Routine w reflex microscopic (not at [facility])     Status: Abnormal   Collection Time: 04/04/16 11:47 PM  Result Value Ref Range   Color, Urine YELLOW YELLOW   APPearance CLEAR CLEAR   Specific Gravity, Urine 1.015 1.005 - 1.030   pH 8.0 5.0 - 8.0   Glucose, UA NEGATIVE NEGATIVE mg/dL   Hgb urine dipstick SMALL (A) NEGATIVE   Bilirubin Urine NEGATIVE NEGATIVE   Ketones, ur 15 (A) NEGATIVE mg/dL   Protein, ur TRACE (A) NEGATIVE mg/dL   Nitrite NEGATIVE NEGATIVE   Leukocytes, UA NEGATIVE NEGATIVE  Urine microscopic-add on     Status: Abnormal   Collection Time: 04/04/16 11:47 PM  Result Value Ref Range   Squamous Epithelial / LPF 0-5 (A) NONE SEEN   WBC, UA 0-5 0 - 5 WBC/hpf   RBC / HPF 0-5 0 - 5 RBC/hpf   Bacteria, UA FEW (A) NONE SEEN  CBC with Differential     Status: Abnormal   Collection Time: 04/05/16 12:01 AM  Result Value Ref Range   WBC 12.7 (H) 4.0 - 10.5 K/uL   RBC 3.69 (L) 4.22 -  5.81 MIL/uL   Hemoglobin 11.6 (L) 13.0 - 17.0 g/dL   HCT 35.4 (L) 39.0 - 52.0 %   MCV 95.9 78.0 - 100.0 fL   MCH 31.4 26.0 - 34.0 pg   MCHC 32.8 30.0 - 36.0 g/dL   RDW 13.1 11.5 - 15.5 %   Platelets 227 150 - 400 K/uL   Neutrophils Relative % 86 %   Neutro Abs 11.0 (H) 1.7 - 7.7 K/uL   Lymphocytes Relative 5 %   Lymphs Abs 0.6 (L) 0.7 - 4.0 K/uL   Monocytes Relative 9 %   Monocytes Absolute 1.1 (H) 0.1 - 1.0 K/uL   Eosinophils Relative 0 %   Eosinophils Absolute 0.0 0.0 - 0.7 K/uL   Basophils Relative 0 %   Basophils Absolute 0.0 0.0 - 0.1 K/uL  Comprehensive metabolic panel     Status: Abnormal   Collection Time: 04/05/16 12:01 AM   Result Value Ref Range   Sodium 137 135 - 145 mmol/L   Potassium 3.6 3.5 - 5.1 mmol/L   Chloride 99 (L) 101 - 111 mmol/L   CO2 29 22 - 32 mmol/L   Glucose, Bld 213 (H) 65 - 99 mg/dL   BUN 18 6 - 20 mg/dL   Creatinine, Ser 0.93 0.61 - 1.24 mg/dL   Calcium 8.5 (L) 8.9 - 10.3 mg/dL   Total Protein 6.6 6.5 - 8.1 g/dL   Albumin 3.5 3.5 - 5.0 g/dL   AST 22 15 - 41 U/L   ALT 16 (L) 17 - 63 U/L   Alkaline Phosphatase 74 38 - 126 U/L   Total Bilirubin 0.8 0.3 - 1.2 mg/dL   GFR calc non Af Amer >60 >60 mL/min   GFR calc Af Amer >60 >60 mL/min    Comment: (NOTE) The eGFR has been calculated using the CKD EPI equation. This calculation has not been validated in all clinical situations. eGFR's persistently <60 mL/min signify possible Chronic Kidney Disease.    Anion gap 9 5 - 15  Lipase, blood     Status: None   Collection Time: 04/05/16 12:01 AM  Result Value Ref Range   Lipase 14 11 - 51 U/L  Lactic acid, plasma     Status: None   Collection Time: 04/05/16 12:01 AM  Result Value Ref Range   Lactic Acid, Venous 1.9 0.5 - 2.0 mmol/L  CBC     Status: Abnormal   Collection Time: 04/05/16  5:48 AM  Result Value Ref Range   WBC 25.4 (H) 4.0 - 10.5 K/uL   RBC 3.76 (L) 4.22 - 5.81 MIL/uL   Hemoglobin 11.6 (L) 13.0 - 17.0 g/dL   HCT 36.1 (L) 39.0 - 52.0 %   MCV 96.0 78.0 - 100.0 fL   MCH 30.9 26.0 - 34.0 pg   MCHC 32.1 30.0 - 36.0 g/dL   RDW 13.3 11.5 - 15.5 %   Platelets 233 150 - 400 K/uL  Comprehensive metabolic panel     Status: Abnormal   Collection Time: 04/05/16  5:48 AM  Result Value Ref Range   Sodium 138 135 - 145 mmol/L   Potassium 3.5 3.5 - 5.1 mmol/L   Chloride 101 101 - 111 mmol/L   CO2 27 22 - 32 mmol/L   Glucose, Bld 185 (H) 65 - 99 mg/dL   BUN 17 6 - 20 mg/dL   Creatinine, Ser 0.95 0.61 - 1.24 mg/dL   Calcium 8.4 (L) 8.9 - 10.3 mg/dL   Total Protein 6.6 6.5 -  8.1 g/dL   Albumin 3.4 (L) 3.5 - 5.0 g/dL   AST 24 15 - 41 U/L   ALT 19 17 - 63 U/L   Alkaline  Phosphatase 67 38 - 126 U/L   Total Bilirubin 1.0 0.3 - 1.2 mg/dL   GFR calc non Af Amer >60 >60 mL/min   GFR calc Af Amer >60 >60 mL/min    Comment: (NOTE) The eGFR has been calculated using the CKD EPI equation. This calculation has not been validated in all clinical situations. eGFR's persistently <60 mL/min signify possible Chronic Kidney Disease.    Anion gap 10 5 - 15  Protime-INR     Status: Abnormal   Collection Time: 04/05/16  5:48 AM  Result Value Ref Range   Prothrombin Time 16.3 (H) 11.6 - 15.2 seconds   INR 1.29 0.00 - 1.49  Glucose, capillary     Status: Abnormal   Collection Time: 04/05/16  7:34 AM  Result Value Ref Range   Glucose-Capillary 163 (H) 65 - 99 mg/dL    Ct Abdomen Pelvis Wo Contrast  04/05/2016  CLINICAL DATA:  Sudden onset generalized abdominal pain for 8 hours. EXAM: CT ABDOMEN AND PELVIS WITHOUT CONTRAST TECHNIQUE: Multidetector CT imaging of the abdomen and pelvis was performed following the standard protocol without IV contrast. COMPARISON:  01/13/2013 FINDINGS: The gallbladder is distended with sludge or noncalcified calculi. There is inflammatory stranding adjacent to the gallbladder and along the undersurface of the liver. There is no bile duct dilatation. No focal liver lesions are evident on this unenhanced scan. The pancreas is unremarkable.  No pancreatic duct dilatation. The spleen, adrenals and kidneys exhibit unremarkable unenhanced appearances. Ureters and urinary bladder are unremarkable. There are normal appearances of the stomach, small bowel and colon. The appendix is normal. The abdominal aorta has a mild infrarenal fusiform dilatation but maximum AP diameter is only 2.9 cm. It resumes normal caliber at the bifurcation. There is no extraluminal air.  There is no ascites. There is no significant skeletal lesion. There is prior instrumented fusion at L4-5 with pedicle screw fixation and interbody fusion cage. There is no significant abnormality in  the lower chest. IMPRESSION: Abnormal gallbladder, appearing distended and thickened with adjacent inflammatory stranding. The stranding opacities continue along the undersurface of the liver. Suspicious for cholecystitis. Consider sonography for better characterization. These results will be called to the ordering clinician or representative by the Radiologist Assistant, and communication documented in the PACS or zVision Dashboard. Electronically Signed   By: Andreas Newport M.D.   On: 04/05/2016 03:21   US Abdomen Complete  04/05/2016  CLINICAL DATA:  Right upper quadrant pain.  Cholecystitis. EXAM: ABDOMEN ULTRASOUND COMPLETE COMPARISON:  CT 04/05/2016 FINDINGS: Gallbladder: Over distended with 1 cm wall thickness that is heterogeneous and striated. There is no focal tenderness, but this could be confounded by analgesic administration. Internally there is echogenic and partially shadowing material likely calculi and hemorrhage as seen on previous CT. Cannot exclude a gallbladder mass. Common bile duct: Diameter: 3 mm were seen.  Initial encounter. Liver: No focal lesion identified. Within normal limits in parenchymal echogenicity. IVC: No abnormality visualized. Pancreas: Essentially nonvisualized due to bowel gas. Recent visualization by CT. Spleen: Size and appearance within normal limits. Fluid-filled structure and neighboring the spleen is stomach based on comparison CT. Right Kidney: Length: 13 cm. Echogenicity within normal limits. No mass or hydronephrosis visualized. Left Kidney: Length: 12 cm. Echogenicity within normal limits. No mass or hydronephrosis visualized. Abdominal aorta: No  aneurysm visualized sonographically. Ectatic distal aorta 29 mm by CT. IMPRESSION: 1. Findings consistent with acute cholecystitis. Murphy's sign is negative, but could be confounded by analgesic medication or advanced disease with insensate gallbladder. Extensive debris in the gallbladder lumen, at least partially  hemorrhage based on previous CT. A gallbladder mass cannot be excluded. 2. No bile duct dilatation. Electronically Signed   By: Monte Fantasia M.D.   On: 04/05/2016 09:37    ROS:  Pertinent items noted in HPI and remainder of comprehensive ROS otherwise negative.  Blood pressure 146/54, pulse 88, temperature 101.4 F (38.6 C), temperature source Oral, resp. rate 22, height _0  (1.88 m), weight 92.171 kg (203 lb 3.2 oz), SpO2 95 %. Physical Exam: Pleasant white male in no acute distress. Neck is supple without lymphadenopathy. Eye examination reveals no scleral icterus. Lungs clear to auscultation with equal breath sounds bilaterally. Heart examination reveals a 2/6 systolic ejection murmur, regular rate and rhythm. No S3, S4. Abdomen is soft with some fullness and tenderness quadrant to palpation. No rigidity is noted. No hepatosplenomegaly or hernias are noted. A large midline surgical scar is present.  Assessment/Plan: Impression: Acalculous cholecystitis. Plan: Would add vancomycin to his IV antibiotics given his recent history of left knee revision and hospitalization. I am concerned that he has early gangrenous changes in the gallbladder wall, but he was on Xeralto until yesterday evening so that preclude any surgical intervention for the next 48 hours. I will start him on a clear liquid diet. I have temporarily schedule the patient for surgery on Monday. I probably have to proceed with an open procedure given his previous partial colectomy. The risks and benefits of the procedure including bleeding, infection, cardiopulmonary difficulties, and the possibility of hepatobiliary injury were fully explained to the patient, who gave informed consent.  [place] A 04/05/2016, 10:33 AM

## 2016-04-05 NOTE — H&P (Signed)
TRH H&P   Patient Demographics:    Jared Bridges, is a 82 y.o. male  MRN: [id number]   DOB - 02-23-1930  Admit Date - 04/04/2016  Outpatient Primary MD for the patient is Doree Albee, MD  Referring MD/NP/PA: Dr Leonides Schanz  Outpatient Specialists: Dr. Sharol Given  Patient coming from: Home  Chief Complaint  Patient presents with  . Abdominal Pain      HPI:    Jared Bridges  is a 82 y.o. male, With history of hyperlipidemia, hypertension, CVA, no particular distress status post colectomy, peptic ulcer disease, abdominal aortic aneurysm, status post total knee replacement who was brought to the hospital today for severe abdominal pain that started yesterday. Patient has been taking hydrocodone for pain due to the total knee replacement done last month. He took laxative after that experienced sharp right upper quadrant abdominal pain. He also had a fever of 102.4 recorded in the ED. CT scan of the abdomen and pelvis showed abnormal gallbladder, appearing distended and thickened with adjacent inflammatory stranding. Suspicious for cholecystitis. Patient started on Zosyn in the ED. Gen. surgery  consulted by ED physician.    Review of systems:    In addition to the HPI above,   No Headache, No changes with Vision or hearing, No problems swallowing food or Liquids, No Chest pain, Cough or Shortness of Breath,  No Blood in stool or Urine, No dysuria, No new skin rashes or bruises, No new joints pains-aches,  No new weakness, tingling, numbness in any extremity, No recent weight gain or loss,  No significant Mental Stressors.  A full 10 point Review of Systems was done, except as stated above, all other Review of Systems were negative.   With Past History of the following :    Past Medical History  Diagnosis Date  . Diverticulitis   . Syncope   . Hypercholesteremia   . GI bleed     Dr. Laural Golden -  1998  . Peptic ulcer disease   . Anemia, iron deficiency   . Depression   . Peripheral neuropathy ([place])   . Polymyalgia rheumatica ([place])   . Essential hypertension     UNDER CONTROL   . Heart murmur   . Myocardial infarction [facility])     ???? IN COLLEGE   NOTHING FOUND ON CATH (Q WAVE SHOWS)  . Stroke ([place])     11/2015      TROUBLE READING+ WRITING   . Thyroid nodule   . Type 2 diabetes mellitus ([name]     DIET CONTROLLED   . Chronic kidney disease     RESOLVED  . Chronic headaches     HX  MIGRAINES       Past Surgical History  Procedure Laterality Date  . Uvulectomy  2004    TURBINATE,TONSIL,ADENOID  . Spinal fusion    . Spinal fusion    . Tonsillectomy    . Eye surgery      right cataract with lens implant  . Appendectomy  as child  . Total knee arthroplasty Left 05/28/2015    Procedure: LEFT TOTAL KNEE ARTHROPLASTY;  Surgeon: Gaynelle Arabian, MD;  Location: [facility];  Service: Orthopedics;  Laterality: Left;  . Irrigation and debridement knee Left 07/09/2015    Procedure: LEFT KNEE IRRIGATION AND DEBRIDEMENT WOUND CLOSURE;  Surgeon: Gaynelle Arabian, MD;  Location: [facility];  Service: Orthopedics;  Laterality: Left;  . Femur surgery      RT LEG        1953  . Hand surgery      RIGHT  . Hernia repair      1-right inguinal, 3- left inguinal  . Colon surgery      FOR DIVERTICULOSIS   1988  . Trigger finger release      RT HAND   1990S  . Back surgery      2000 IDET SPINAL PROC  . Shoulder open rotator cuff repair      RT SHOULDER  . Cryotherapy    . Meniscus repair      LEFT  . Knee debridement      2016   LEFT  . Total knee revision Left 02/28/2016    Procedure: LEFT TOTAL KNEE REVISION;  Surgeon: Leandrew Koyanagi, MD;  Location: [facility];  Service: Orthopedics;  Laterality: Left;      Social History:     Social History  Substance Use Topics  . Smoking status: Former Smoker -- 0.10 packs/day    Types: Cigarettes    Start date: 12/18/1958    Quit date: 12/01/1986  .  Smokeless tobacco: Never Used  . Alcohol Use: No     Lives - At home       Family History :     Family History  Problem Relation Age of Onset  . Hypertension    . Depression Brother   . Depression Daughter   . Colon cancer Sister       Home Medications:   Prior to Admission medications   Medication Sig Start Date End Date Taking? Authorizing Provider  aspirin EC 325 MG tablet Take 1 tablet (325 mg total) by mouth 2 (two) times daily. 02/28/16   Leandrew Koyanagi, MD  atorvastatin (LIPITOR) 40 MG tablet Take 1 tablet (40 mg total) by mouth daily at 6 PM. 12/20/15   Orvan Falconer, MD  Cholecalciferol (VITAMIN D3) 5000 units CAPS Take 5,000 Units by mouth at bedtime.    Historical Provider, MD  clonazePAM (KLONOPIN) 1 MG tablet Take 1 tablet (1 mg total) by mouth at bedtime. Patient taking differently: Take 1 mg by mouth at bedtime as needed for anxiety.  11/22/15 11/21/16  Asencion Partridge Dohmeier, MD  diclofenac sodium (VOLTAREN) 1 % GEL Apply 2 g topically daily as needed (for pain). 11/22/15   Asencion Partridge Dohmeier, MD  divalproex (DEPAKOTE) 250 MG DR tablet Take 1 tablet (250 mg total) by mouth every morning. 11/22/15   Asencion Partridge Dohmeier, MD  escitalopram (LEXAPRO) 5 MG tablet Take 5 mg by mouth at bedtime. 01/31/16   Historical Provider, MD  fluticasone (FLONASE) 50 MCG/ACT nasal spray Place 1 spray into both nostrils 2 (two) times daily as needed for allergies.  01/07/16   Historical Provider, MD  HYDROcodone-acetaminophen (NORCO/VICODIN) 5-325 MG per tablet Take 1-2 tablets by mouth every 4 (four) hours as needed for moderate pain. Patient taking differently: Take 0.5-1 tablets by mouth every 6 (six) hours as needed for moderate pain. Uses as needed for migraine pain 07/12/15   Arlee Muslim,  PA-C  ibuprofen (ADVIL,MOTRIN) 200 MG tablet Take 400 mg by mouth 3 (three) times daily. For osteoarthritis and carpal tunnel    Historical Provider, MD  methocarbamol (ROBAXIN) 750 MG tablet Take 1 tablet (750 mg  total) by mouth 2 (two) times daily as needed for muscle spasms. 02/28/16   Naiping Ephriam Jenkins, MD  ondansetron (ZOFRAN) 4 MG tablet Take 1-2 tablets (4-8 mg total) by mouth every 8 (eight) hours as needed for nausea or vomiting. 02/28/16   Leandrew Koyanagi, MD  oxyCODONE (OXY IR/ROXICODONE) 5 MG immediate release tablet Take 1-3 tablets (5-15 mg total) by mouth every 4 (four) hours as needed. 02/28/16   Leandrew Koyanagi, MD  oxyCODONE (OXYCONTIN) 10 mg 12 hr tablet Take 1 tablet (10 mg total) by mouth every 12 (twelve) hours. 02/28/16   Naiping Ephriam Jenkins, MD  predniSONE (DELTASONE) 5 MG tablet Take 5 mg by mouth 2 (two) times a week. Tuesday and Thursday 10/03/14   Historical Provider, MD  pregabalin (LYRICA) 25 MG capsule Take 25 mg by mouth at bedtime.    Historical Provider, MD  senna-docusate (SENOKOT S) 8.6-50 MG tablet Take 1 tablet by mouth at bedtime as needed. 02/28/16   Leandrew Koyanagi, MD  traMADol (ULTRAM) 50 MG tablet Take 1-2 tablets by mouth every 6 (six) hours as needed for moderate pain.  01/26/16   Historical Provider, MD  venlafaxine XR (EFFEXOR-XR) 150 MG 24 hr capsule Take 1 capsule (150 mg total) by mouth daily. 03/31/16 03/31/17  Cloria Spring, MD     Allergies:     Allergies  Allergen Reactions  . Calan [Verapamil] Other (See Comments)    Weakness  . Cymbalta [Duloxetine Hcl] Other (See Comments)    Makes patient have jerking motions.  . Iodinated Diagnostic Agents Hives and Itching    Can pre-med with benadryl  . Iodine Hives and Itching    very allergic(per daughter), can pre-med with benadryl  . Latex Other (See Comments)    Redness iritation  . Lisinopril Other (See Comments)    Makes patient have jerking motions.  . Neurontin [Gabapentin] Other (See Comments)    Makes patient have jerking motions.  [name] Kitchen Zoloft [Sertraline Hcl] Diarrhea and Other (See Comments)    Makes patient have jerking motions.  . Protonix [Pantoprazole Sodium] Other (See Comments)    "Did not work"  . Valium  [Diazepam] Other (See Comments)    "Did not work"  . Flomax [Tamsulosin Hcl] Other (See Comments)    Dizziness  . Glimepiride Other (See Comments)    "Did not work"  . Melatonin Nausea Only  . Tape Other (See Comments)    Skin irritation     Physical Exam:   Vitals  Blood pressure 139/67, pulse 84, temperature 101.4 F (38.6 C), temperature source Oral, resp. rate 18, height 6\' 2"  (1.88 m), weight 97.07 kg (214 lb), SpO2 88 %.   1. General Elderly Caucasian male* lying in bed in NAD, cooperative with exam  2. Normal affect and insight,  Awake Alert, Oriented X 3.  3. No F.N deficits, ALL C.Nerves Intact, Strength 5/5 all 4 extremities, Sensation intact all 4 extremities, Plantars down going.  4. Ears and Eyes appear Normal, Conjunctivae clear, PERRLA. Moist Oral Mucosa.  5. Supple Neck, No JVD, No cervical lymphadenopathy appriciated, No Carotid Bruits.  6. Symmetrical Chest wall movement, Good air movement bilaterally, CTAB.  7. RRR, No Gallops, Rubs or Murmurs, No Parasternal Heave.  8. Positive  Bowel Sounds, Abdomen Soft, right upper quadrant tenderness to palpation, No organomegaly appriciated,No rebound -guarding or rigidity.  9.  No Cyanosis, Normal Skin Turgor, No Skin Rash or Bruise.  10. Good muscle tone,  joints appear normal , no effusions, Normal ROM.      Data Review:    CBC  Recent Labs Lab 04/05/16 0001  WBC 12.7*  HGB 11.6*  HCT 35.4*  PLT 227  MCV 95.9  MCH 31.4  MCHC 32.8  RDW 13.1  LYMPHSABS 0.6*  MONOABS 1.1*  EOSABS 0.0  BASOSABS 0.0   ------------------------------------------------------------------------------------------------------------------  Chemistries   Recent Labs Lab 04/05/16 0001  NA 137  K 3.6  CL 99*  CO2 29  GLUCOSE 213*  BUN 18  CREATININE 0.93  CALCIUM 8.5*  AST 22  ALT 16*  ALKPHOS 74  BILITOT 0.8    ------------------------------------------------------------------------------------------------------------------  -------------------------------------------------------------------------------------------------------------------    ---------------------------------------------------------------------------------------------------------------  Urinalysis    Component Value Date/Time   COLORURINE YELLOW 04/04/2016 2347   APPEARANCEUR CLEAR 04/04/2016 2347   LABSPEC 1.015 04/04/2016 2347   PHURINE 8.0 04/04/2016 2347   GLUCOSEU NEGATIVE 04/04/2016 2347   HGBUR SMALL* 04/04/2016 2347   [place] NEGATIVE 04/04/2016 2347   KETONESUR 15* 04/04/2016 2347   PROTEINUR TRACE* 04/04/2016 2347   UROBILINOGEN 1.0 05/21/2015 1050   NITRITE NEGATIVE 04/04/2016 2347   LEUKOCYTESUR NEGATIVE 04/04/2016 2347    ----------------------------------------------------------------------------------------------------------------   Imaging Results:    Ct Abdomen Pelvis Wo Contrast  04/05/2016  CLINICAL DATA:  Sudden onset generalized abdominal pain for 8 hours. EXAM: CT ABDOMEN AND PELVIS WITHOUT CONTRAST TECHNIQUE: Multidetector CT imaging of the abdomen and pelvis was performed following the standard protocol without IV contrast. COMPARISON:  01/13/2013 FINDINGS: The gallbladder is distended with sludge or noncalcified calculi. There is inflammatory stranding adjacent to the gallbladder and along the undersurface of the liver. There is no bile duct dilatation. No focal liver lesions are evident on this unenhanced scan. The pancreas is unremarkable.  No pancreatic duct dilatation. The spleen, adrenals and kidneys exhibit unremarkable unenhanced appearances. Ureters and urinary bladder are unremarkable. There are normal appearances of the stomach, small bowel and colon. The appendix is normal. The abdominal aorta has a mild infrarenal fusiform dilatation but maximum AP diameter is only 2.9 cm. It resumes  normal caliber at the bifurcation. There is no extraluminal air.  There is no ascites. There is no significant skeletal lesion. There is prior instrumented fusion at L4-5 with pedicle screw fixation and interbody fusion cage. There is no significant abnormality in the lower chest. IMPRESSION: Abnormal gallbladder, appearing distended and thickened with adjacent inflammatory stranding. The stranding opacities continue along the undersurface of the liver. Suspicious for cholecystitis. Consider sonography for better characterization. These results will be called to the ordering clinician or representative by the Radiologist Assistant, and communication documented in the PACS or zVision Dashboard. Electronically Signed   By: Andreas Newport M.D.   On: 04/05/2016 03:21      Assessment & Plan:    Active Problems:   Acute cholecystitis     1. Right upper quadrant pain- likely from cholecystitis. Will order abdominal ultrasound in a.m. to confirm the diagnosis. General surgery has been consulted Dr. Wynonia Sours to see the patient. Zosyn has been started in the ED. Continue Zosyn per pharmacy consultation. Continue morphine 2 mg every 4 hours when necessary for pain. 2. Diabetes mellitus- patient has history of diabetes mellitus, not controlled. We'll start sliding scale insulin with NovoLog. 3. 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 Will check PT/INR. Hold anticoagulation at this time.   DVT Prophylaxis-- SCDs   AM Labs Ordered, also please review Full Orders  Family Communication: Admission, patients condition and plan of care including tests being ordered have been discussed with the patient and His daughter and wife at bedside* who indicate understanding and agree with the plan and Code Status.  Code Status Full code  Admission status:  Inpatient  Time spent in minutes : 50 min   Cheryal Salas S M.D on  04/05/2016 at 4:44 AM  Between 7am to 7pm - Pager - [phone]. After 7pm go to [email or web address] - password [facility]  [facility] - Office  [phone]

## 2016-04-05 NOTE — Progress Notes (Signed)
PROGRESS NOTE        PATIENT DETAILS Name: Jared Bridges Age: 82 y.o. Sex: male Date of Birth: [birth year] Admit Date: 04/04/2016 Admitting Physician Oswald Hillock, MD [id number] C, MD Outpatient Specialists: Dr. Domenic Polite  Brief Narrative: Patient is a 82 y.o. male prior CVA without any focal deficits, chronic migraine headaches, dyslipidemia, recent left knee replacement still on prophylactic Xarelto (last dose 5/5 am)- who presented with right upper quadrant abdominal pain. CT of the abdomen was suspicious for acute cholecystitis.   Subjective: Continues to have upper abdominal pain-but mostly worse in the right upper quadrant area. No nausea or vomiting.  Assessment/Plan: Active Problems: Probable Acute cholecystitis:Continues to have tenderness in the right upper quadrant area, with worsening leukocytosis-but otherwise looks nontoxic Continue NPO, intravenous Zosyn. Await general surgery evaluation. RUQ ultrasound pending. Note last dose of Xarelto was on 5/5 AM.  Recent left total knee revision: Continues to be on  Xarelto for VTE prophylaxis-now held  History of CVA: Nonfocal exam-continue statin   History of aortic stenosis: Systolic murmur [id number] echocardiogram January 2017 shows mild aortic stenosis.  History of migraine headaches: Currently headache free-continue Depakote and Lyrica  Hypertension: Fairly not on any antihypertensives-blood pressure well controlled.  History of diabetes: Not on oral hypoglycemic agents at home-CBGs stable with SSI. Follow.  History of peptic ulcer disease: start Pepcid  History of polymyalgia rheumatica: Steroids have been weaned down to prednisone 5 mg twice a week for the past 1 month. Prior to this patient was on daily steroids. Currently appears stable-we will resume his usual prednisone dosing-if he becomes acutely ill or worsens, will need stress dosing of steroids.  DVT  Prophylaxis: SCD's  Code Status: Full code   Family Communication: Spoke with daughter over phone  Disposition Plan: Remain inpatient-but will plan on Home health vs SNF on discharge  Antimicrobial agents: IV Zosyn 5/6>>  Procedures: None  CONSULTS:  general surgery  Time spent: 25 minutes-Greater than 50% of this time was spent in counseling, explanation of diagnosis, planning of further management, and coordination of care.  MEDICATIONS: Anti-infectives    Start     Dose/Rate Route Frequency Ordered Stop   04/05/16 1200  piperacillin-tazobactam (ZOSYN) IVPB 3.375 g     3.375 g 12.5 mL/hr over 240 Minutes Intravenous Every 8 hours 04/05/16 0730     04/05/16 0345  piperacillin-tazobactam (ZOSYN) IVPB 3.375 g     3.375 g 12.5 mL/hr over 240 Minutes Intravenous  Once 04/05/16 0340 04/05/16 0800      Scheduled Meds: . sodium chloride   Intravenous STAT  . atorvastatin  40 mg Oral q1800  . divalproex  250 mg Oral q morning - 10a  . insulin aspart  0-9 Units Subcutaneous TID WC  . piperacillin-tazobactam (ZOSYN)  IV  3.375 g Intravenous Q8H  . pregabalin  25 mg Oral QHS  . venlafaxine XR  150 mg Oral Daily   Continuous Infusions: . sodium chloride 75 mL/hr at 04/05/16 0539   PRN Meds:.clonazePAM, morphine injection, ondansetron **OR** ondansetron (ZOFRAN) IV   PHYSICAL EXAM: Vital signs: Filed Vitals:   04/05/16 0402 04/05/16 0444 04/05/16 0447 04/05/16 0607  BP:   146/54   Pulse:   88   Temp: 101.4 F (38.6 C)  101.4 F (38.6 C)   TempSrc: Oral  Oral   Resp:   22  Height:  6\' 2"  (1.88 m)    Weight:  92.171 kg (203 lb 3.2 oz)    SpO2:   93% 95%   Filed Weights   04/04/16 2331 04/05/16 0444  Weight: 97.07 kg (214 lb) 92.171 kg (203 lb 3.2 oz)   Body mass index is 26.08 kg/(m^2).   Gen Exam: Awake and alert with clear speech. Not in any distress  Neck: Supple, No JVD.   Chest: B/L Clear.   CVS: S1 S2 Regular, 3/6 systolic murmur.  Abdomen: soft,  BS +,  tender RUQ, non distended.  Extremities: no edema, lower extremities warm to touch. Neurologic: Non Focal.   Skin: No Rash or lesions   Wounds: N/A.    LABORATORY DATA: CBC:  Recent Labs Lab 04/05/16 0001 04/05/16 0548  WBC 12.7* 25.4*  NEUTROABS 11.0*  --   HGB 11.6* 11.6*  HCT 35.4* 36.1*  MCV 95.9 96.0  PLT 227 0000000    Basic Metabolic Panel:  Recent Labs Lab 04/05/16 0001 04/05/16 0548  NA 137 138  K 3.6 3.5  CL 99* 101  CO2 29 27  GLUCOSE 213* 185*  BUN 18 17  CREATININE 0.93 0.95  CALCIUM 8.5* 8.4*    GFR: Estimated Creatinine Clearance: 68.5 mL/min (by C-G formula based on Cr of 0.95).  Liver Function Tests:  Recent Labs Lab 04/05/16 0001 04/05/16 0548  AST 22 24  ALT 16* 19  ALKPHOS 74 67  BILITOT 0.8 1.0  PROT 6.6 6.6  ALBUMIN 3.5 3.4*    Recent Labs Lab 04/05/16 0001  LIPASE 14   No results for input(s): AMMONIA in the last 168 hours.  Coagulation Profile:  Recent Labs Lab 04/05/16 0548  INR 1.29    Cardiac Enzymes: No results for input(s): CKTOTAL, CKMB, CKMBINDEX, TROPONINI in the last 168 hours.  BNP (last 3 results) No results for input(s): PROBNP in the last 8760 hours.  HbA1C: No results for input(s): HGBA1C in the last 72 hours.  CBG:  Recent Labs Lab 04/05/16 0734  GLUCAP 163*    Lipid Profile: No results for input(s): CHOL, HDL, LDLCALC, TRIG, CHOLHDL, LDLDIRECT in the last 72 hours.  Thyroid Function Tests: No results for input(s): TSH, T4TOTAL, FREET4, T3FREE, THYROIDAB in the last 72 hours.  Anemia Panel: No results for input(s): VITAMINB12, FOLATE, FERRITIN, TIBC, IRON, RETICCTPCT in the last 72 hours.  Urine analysis:    Component Value Date/Time   COLORURINE YELLOW 04/04/2016 2347   APPEARANCEUR CLEAR 04/04/2016 2347   LABSPEC 1.015 04/04/2016 2347   PHURINE 8.0 04/04/2016 2347   GLUCOSEU NEGATIVE 04/04/2016 2347   HGBUR SMALL* 04/04/2016 2347   BILIRUBINUR NEGATIVE 04/04/2016 2347    KETONESUR 15* 04/04/2016 2347   PROTEINUR TRACE* 04/04/2016 2347   UROBILINOGEN 1.0 05/21/2015 1050   NITRITE NEGATIVE 04/04/2016 2347   LEUKOCYTESUR NEGATIVE 04/04/2016 2347    Sepsis Labs: Lactic Acid, Venous    Component Value Date/Time   LATICACIDVEN 1.9 04/05/2016 0001    MICROBIOLOGY: No results found for this or any previous visit (from the past 240 hour(s)).  RADIOLOGY STUDIES/RESULTS: Ct Abdomen Pelvis Wo Contrast  04/05/2016  CLINICAL DATA:  Sudden onset generalized abdominal pain for 8 hours. EXAM: CT ABDOMEN AND PELVIS WITHOUT CONTRAST TECHNIQUE: Multidetector CT imaging of the abdomen and pelvis was performed following the standard protocol without IV contrast. COMPARISON:  01/13/2013 FINDINGS: The gallbladder is distended with sludge or noncalcified calculi. There is inflammatory stranding adjacent to the gallbladder and along the undersurface  of the liver. There is no bile duct dilatation. No focal liver lesions are evident on this unenhanced scan. The pancreas is unremarkable.  No pancreatic duct dilatation. The spleen, adrenals and kidneys exhibit unremarkable unenhanced appearances. Ureters and urinary bladder are unremarkable. There are normal appearances of the stomach, small bowel and colon. The appendix is normal. The abdominal aorta has a mild infrarenal fusiform dilatation but maximum AP diameter is only 2.9 cm. It resumes normal caliber at the bifurcation. There is no extraluminal air.  There is no ascites. There is no significant skeletal lesion. There is prior instrumented fusion at L4-5 with pedicle screw fixation and interbody fusion cage. There is no significant abnormality in the lower chest. IMPRESSION: Abnormal gallbladder, appearing distended and thickened with adjacent inflammatory stranding. The stranding opacities continue along the undersurface of the liver. Suspicious for cholecystitis. Consider sonography for better characterization. These results will be  called to the ordering clinician or representative by the Radiologist Assistant, and communication documented in the PACS or zVision Dashboard. Electronically Signed   By: Andreas Newport M.D.   On: 04/05/2016 03:21     LOS: 0 days   Oren Binet, MD  [facility] Pager:[phone]  If 7PM-7AM, please contact night-coverage [email or web address] Password [email or web address] 04/05/2016, 8:53 AM

## 2016-04-05 NOTE — Progress Notes (Addendum)
ANTIBIOTIC CONSULT NOTE - INITIAL  Pharmacy Consult for Zosyn / Vancomycin Indication: Intra Abdominal Infection  Allergies  Allergen Reactions  . Calan [Verapamil] Other (See Comments)    Weakness  . Cymbalta [Duloxetine Hcl] Other (See Comments)    Makes patient have jerking motions.  . Iodinated Diagnostic Agents Hives and Itching    Can pre-med with benadryl  . Iodine Hives and Itching    very allergic(per daughter), can pre-med with benadryl  . Latex Other (See Comments)    Redness iritation  . Lisinopril Other (See Comments)    Makes patient have jerking motions.  . Neurontin [Gabapentin] Other (See Comments)    Makes patient have jerking motions.  [name] Kitchen Zoloft [Sertraline Hcl] Diarrhea and Other (See Comments)    Makes patient have jerking motions.  . Protonix [Pantoprazole Sodium] Other (See Comments)    "Did not work"  . Valium [Diazepam] Other (See Comments)    "Did not work"  . Flomax [Tamsulosin Hcl] Other (See Comments)    Dizziness  . Glimepiride Other (See Comments)    "Did not work"  . Melatonin Nausea Only  . Tape Other (See Comments)    Skin irritation    Patient Measurements: Height: 6\' 2"  (188 cm) Weight: 203 lb 3.2 oz (92.171 kg) IBW/kg (Calculated) : 82.2 Adjusted Body Weight:   Vital Signs: Temp: 101.4 F (38.6 C) (05/06 0447) Temp Source: Oral (05/06 0447) BP: 146/54 mmHg (05/06 0447) Pulse Rate: 88 (05/06 0447) Intake/Output from previous day:   Intake/Output from this shift:    Labs:  Recent Labs  04/05/16 0001 04/05/16 0548  WBC 12.7* 25.4*  HGB 11.6* 11.6*  PLT 227 233  CREATININE 0.93 0.95   Estimated Creatinine Clearance: 68.5 mL/min (by C-G formula based on Cr of 0.95). No results for input(s): VANCOTROUGH, VANCOPEAK, VANCORANDOM, GENTTROUGH, GENTPEAK, GENTRANDOM, TOBRATROUGH, TOBRAPEAK, TOBRARND, AMIKACINPEAK, AMIKACINTROU, AMIKACIN in the last 72 hours.   Microbiology: No results found for this or any previous visit  (from the past 720 hour(s)).  Medical History: Past Medical History  Diagnosis Date  . Diverticulitis   . Syncope   . Hypercholesteremia   . GI bleed     Dr. Laural Golden - 1998  . Peptic ulcer disease   . Anemia, iron deficiency   . Depression   . Peripheral neuropathy ([place])   . Polymyalgia rheumatica ([place])   . Essential hypertension     UNDER CONTROL   . Heart murmur   . Myocardial infarction [facility])     ???? IN COLLEGE   NOTHING FOUND ON CATH (Q WAVE SHOWS)  . Stroke ([place])     11/2015      TROUBLE READING+ WRITING   . Thyroid nodule   . Type 2 diabetes mellitus ([name]     DIET CONTROLLED   . Chronic kidney disease     RESOLVED  . Chronic headaches     HX  MIGRAINES     Medications:  Scheduled:  . sodium chloride   Intravenous STAT  . atorvastatin  40 mg Oral q1800  . divalproex  250 mg Oral q morning - 10a  . insulin aspart  0-9 Units Subcutaneous TID WC  . piperacillin-tazobactam (ZOSYN)  IV  3.375 g Intravenous Q8H  . pregabalin  25 mg Oral QHS  . venlafaxine XR  150 mg Oral Daily   Assessment: 82 yo male admitted right upper quadrant pain, likely cholecystitis. General surgery to be consulted. Zosyn 3.375 GM IV given in ED CrCl >  20 ml/min  Goal of Therapy:  Eradicate infection Vancomycin trough 15-20  Plan:  Zosyn 3.375 GM IV every 8 hours (4 hour infusion) Vancomycin 1500 mg IV loading dose, then 1250 mg IV every 12 hours Vancomycin trough at steady state Monitor renal function F/U labs, cultures  [place], Jared Bridges 04/05/2016,8:02 AM

## 2016-04-05 NOTE — ED Notes (Signed)
While sleeping O2 states decreased 88-89%, 2 Liters [place] applied. Patient still pain free. Family at bedside.

## 2016-04-05 NOTE — ED Notes (Signed)
Report given to Dept 300, all questions answered.

## 2016-04-06 DIAGNOSIS — G43809 Other migraine, not intractable, without status migrainosus: Secondary | ICD-10-CM

## 2016-04-06 DIAGNOSIS — I350 Nonrheumatic aortic (valve) stenosis: Secondary | ICD-10-CM

## 2016-04-06 DIAGNOSIS — K810 Acute cholecystitis: Secondary | ICD-10-CM

## 2016-04-06 DIAGNOSIS — I10 Essential (primary) hypertension: Secondary | ICD-10-CM

## 2016-04-06 LAB — SURGICAL PCR SCREEN
MRSA, PCR: NEGATIVE
Staphylococcus aureus: NEGATIVE

## 2016-04-06 LAB — COMPREHENSIVE METABOLIC PANEL
ALBUMIN: 2.9 g/dL — AB (ref 3.5–5.0)
ALT: 23 U/L (ref 17–63)
AST: 25 U/L (ref 15–41)
Alkaline Phosphatase: 57 U/L (ref 38–126)
Anion gap: 9 (ref 5–15)
BILIRUBIN TOTAL: 1.3 mg/dL — AB (ref 0.3–1.2)
BUN: 21 mg/dL — AB (ref 6–20)
CHLORIDE: 99 mmol/L — AB (ref 101–111)
CO2: 27 mmol/L (ref 22–32)
CREATININE: 1.01 mg/dL (ref 0.61–1.24)
Calcium: 8 mg/dL — ABNORMAL LOW (ref 8.9–10.3)
GFR calc Af Amer: >60 mL/min (ref >60)
GLUCOSE: 160 mg/dL — AB (ref 65–99)
Potassium: 3.6 mmol/L (ref 3.5–5.1)
Sodium: 135 mmol/L (ref 135–145)
TOTAL PROTEIN: 6.1 g/dL — AB (ref 6.5–8.1)

## 2016-04-06 LAB — CBC
HEMATOCRIT: 33.3 % — AB (ref 39.0–52.0)
Hemoglobin: 10.9 g/dL — ABNORMAL LOW (ref 13.0–17.0)
MCH: 32.1 pg (ref 26.0–34.0)
MCHC: 32.7 g/dL (ref 30.0–36.0)
MCV: 97.9 fL (ref 78.0–100.0)
Platelets: 216 10*3/uL (ref 150–400)
RBC: 3.4 MIL/uL — AB (ref 4.22–5.81)
RDW: 13.8 % (ref 11.5–15.5)
WBC: 27.9 10*3/uL — AB (ref 4.0–10.5)

## 2016-04-06 LAB — GLUCOSE, CAPILLARY
Glucose-Capillary: 140 mg/dL — ABNORMAL HIGH (ref 65–99)
Glucose-Capillary: 161 mg/dL — ABNORMAL HIGH (ref 65–99)
Glucose-Capillary: 152 mg/dL — ABNORMAL HIGH (ref 65–99)
Glucose-Capillary: 149 mg/dL — ABNORMAL HIGH (ref 65–99)

## 2016-04-06 LAB — ABO/RH: ABO/RH(D): O POS

## 2016-04-06 LAB — PREPARE RBC (CROSSMATCH)

## 2016-04-06 MED ORDER — SIMETHICONE 80 MG PO CHEW: 80.0000 mg | CHEWABLE_TABLET | Freq: Four times a day (QID) | ORAL | Status: DC | PRN

## 2016-04-06 MED ORDER — PREDNISONE 10 MG PO TABS: 5.0000 mg | ORAL_TABLET | ORAL | Status: DC

## 2016-04-06 MED ORDER — CHLORHEXIDINE GLUCONATE 4 % EX LIQD
1.0000 "application " | Freq: Once | CUTANEOUS | Status: AC
  Administered 2016-04-06: 1 via TOPICAL
  Filled 2016-04-06: qty 15

## 2016-04-06 NOTE — Progress Notes (Signed)
PROGRESS NOTE        PATIENT DETAILS Name: Jared Bridges Age: 82 y.o. Sex: male Date of Birth: 05-21-1930 Admit Date: 04/04/2016 Admitting Physician Oswald Hillock, MD [id number], MD Outpatient Specialists: Dr. Domenic Polite  Brief Narrative: Patient is a 82 y.o. male prior CVA without any focal deficits, chronic migraine headaches, dyslipidemia, recent left knee replacement-on prophylactic Xarelto (last dose 5/5 am)- who presented with right upper quadrant abdominal pain. CT of the abdomen and ultrasound of the gallbladder suspicious for acute cholecystitis.   Subjective: Abdominal pain better compared to yesterday. Denies any nausea or vomiting.  Assessment/Plan: Active Problems: Acute cholecystitis: abdominal pain somewhat improved, but continues to have worsening leukocytosis. Continue empiric antibiotics. Gen. surgery consulted, plans are for cholecystectomy 5/8.   Recent left total knee revision: was on prophylactic Xarelto prior to this admission-last dose on 5/5 AM. Xarelto has not been discontinued, will need to be initiated DVT prophylaxis post surgery. Left knee-with no obvious infection-however has skin discoloration.   History of CVA: Nonfocal exam-continue statin   History of aortic stenosis: Systolic murmur [id number] echocardiogram January 2017 shows mild aortic stenosis.  History of migraine headaches: Currently headache free-continue Depakote and Lyrica  Hypertension:Not on any antihypertensives-blood pressure well controlled.  History of diabetes: Not on oral hypoglycemic agents at home-CBGs stable with SSI. Follow.  History of peptic ulcer disease: continue Pepcid  History of polymyalgia rheumatica: Steroids have been weaned down to prednisone 5 mg twice a week for the past 1 month. Prior to this patient was on daily steroids. Currently appears stable-we will resume his usual prednisone dosing-if he becomes acutely ill or worsens,  will need stress dosing of steroids.  DVT Prophylaxis: SCD's  Code Status: Full code   Family Communication: None at bedside   Disposition Plan: Remain inpatient-but will plan on Home health vs SNF on discharge  Antimicrobial agents: IV Zosyn 5/6>>  Procedures: None  CONSULTS:  general surgery  Time spent: 25 minutes-Greater than 50% of this time was spent in counseling, explanation of diagnosis, planning of further management, and coordination of care.  MEDICATIONS: Anti-infectives    Start     Dose/Rate Route Frequency Ordered Stop   04/05/16 2300  vancomycin (VANCOCIN) 1,250 mg in sodium chloride 0.9 % 250 mL IVPB     1,250 mg 166.7 mL/hr over 90 Minutes Intravenous Every 12 hours 04/05/16 0954     04/05/16 1200  piperacillin-tazobactam (ZOSYN) IVPB 3.375 g     3.375 g 12.5 mL/hr over 240 Minutes Intravenous Every 8 hours 04/05/16 0730     04/05/16 1100  vancomycin (VANCOCIN) 1,500 mg in sodium chloride 0.9 % 500 mL IVPB     1,500 mg 250 mL/hr over 120 Minutes Intravenous  Once 04/05/16 0952 04/05/16 1251   04/05/16 0345  piperacillin-tazobactam (ZOSYN) IVPB 3.375 g     3.375 g 12.5 mL/hr over 240 Minutes Intravenous  Once 04/05/16 0340 04/05/16 0800      Scheduled Meds: . atorvastatin  40 mg Oral q1800  . divalproex  250 mg Oral q morning - 10a  . famotidine (PEPCID) IV  20 mg Intravenous Q24H  . insulin aspart  0-9 Units Subcutaneous TID WC  . piperacillin-tazobactam (ZOSYN)  IV  3.375 g Intravenous Q8H  . pregabalin  25 mg Oral QHS  . vancomycin  1,250 mg Intravenous Q12H  .  venlafaxine XR  75 mg Oral Daily   Continuous Infusions: . dextrose 5 % and 0.45 % NaCl with KCl 20 mEq/L 75 mL/hr at 04/05/16 1051   PRN Meds:.clonazePAM, morphine injection, ondansetron **OR** ondansetron (ZOFRAN) IV   PHYSICAL EXAM: Vital signs: Filed Vitals:   04/05/16 1527 04/05/16 2036 04/05/16 2056 04/06/16 0420  BP: 107/52 116/72  111/59  Pulse: 72 74 74 77  Temp:  98.8 F (37.1 C) 99.1 F (37.3 C)  98.4 F (36.9 C)  TempSrc: Oral Oral  Oral  Resp: 20 20 18 20   Height:      Weight:      SpO2: 97% 97% 97% 95%   Filed Weights   04/04/16 2331 04/05/16 0444  Weight: 97.07 kg (214 lb) 92.171 kg (203 lb 3.2 oz)   Body mass index is 26.08 kg/(m^2).   Gen Exam: Awake and alert with clear speech. Not in any distress  Neck: Supple, No JVD.   Chest: B/L Clear.   CVS: S1 S2 Regular, 3/6 systolic murmur.  Abdomen: soft, BS +,  tender RUQ, non distended.  Extremities: no edema, lower extremities warm to touch. Neurologic: Non Focal.   Skin: No Rash or lesions   Wounds: N/A.    LABORATORY DATA: CBC:  Recent Labs Lab 04/05/16 0001 04/05/16 0548 04/06/16 0542  WBC 12.7* 25.4* 27.9*  NEUTROABS 11.0*  --   --   HGB 11.6* 11.6* 10.9*  HCT 35.4* 36.1* 33.3*  MCV 95.9 96.0 97.9  PLT 227 233 [id number]    Basic Metabolic Panel:  Recent Labs Lab 04/05/16 0001 04/05/16 0548 04/06/16 0542  NA 137 138 135  K 3.6 3.5 3.6  CL 99* 101 99*  CO2 29 27 27   GLUCOSE 213* 185* 160*  BUN 18 17 21*  CREATININE 0.93 0.95 1.01  CALCIUM 8.5* 8.4* 8.0*    GFR: Estimated Creatinine Clearance: 64.4 mL/min (by C-G formula based on Cr of 1.01).  Liver Function Tests:  Recent Labs Lab 04/05/16 0001 04/05/16 0548 04/06/16 0542  AST 22 24 25   ALT 16* 19 23  ALKPHOS 74 67 57  BILITOT 0.8 1.0 1.3*  PROT 6.6 6.6 6.1*  ALBUMIN 3.5 3.4* 2.9*    Recent Labs Lab 04/05/16 0001  LIPASE 14   No results for input(s): AMMONIA in the last 168 hours.  Coagulation Profile:  Recent Labs Lab 04/05/16 0548  INR 1.29    Cardiac Enzymes: No results for input(s): CKTOTAL, CKMB, CKMBINDEX, TROPONINI in the last 168 hours.  BNP (last 3 results) No results for input(s): PROBNP in the last 8760 hours.  HbA1C: No results for input(s): HGBA1C in the last 72 hours.  CBG:  Recent Labs Lab 04/05/16 0734 04/05/16 1113 04/05/16 1621 04/05/16 2034  04/06/16 0740  GLUCAP 163* 153* 128* 155* 140*    Lipid Profile: No results for input(s): CHOL, HDL, LDLCALC, TRIG, CHOLHDL, LDLDIRECT in the last 72 hours.  Thyroid Function Tests: No results for input(s): TSH, T4TOTAL, FREET4, T3FREE, THYROIDAB in the last 72 hours.  Anemia Panel: No results for input(s): VITAMINB12, FOLATE, FERRITIN, TIBC, IRON, RETICCTPCT in the last 72 hours.  Urine analysis:    Component Value Date/Time   COLORURINE YELLOW 04/04/2016 2347   APPEARANCEUR CLEAR 04/04/2016 2347   LABSPEC 1.015 04/04/2016 2347   PHURINE 8.0 04/04/2016 2347   GLUCOSEU NEGATIVE 04/04/2016 2347   HGBUR SMALL* 04/04/2016 2347   BILIRUBINUR NEGATIVE 04/04/2016 2347   KETONESUR 15* 04/04/2016 2347   PROTEINUR TRACE* 04/04/2016  2347   UROBILINOGEN 1.0 05/21/2015 1050   NITRITE NEGATIVE 04/04/2016 2347   LEUKOCYTESUR NEGATIVE 04/04/2016 2347    Sepsis Labs: Lactic Acid, Venous    Component Value Date/Time   LATICACIDVEN 1.9 04/05/2016 0001    MICROBIOLOGY: No results found for this or any previous visit (from the past 240 hour(s)).  RADIOLOGY STUDIES/RESULTS: Ct Abdomen Pelvis Wo Contrast  04/05/2016  CLINICAL DATA:  Sudden onset generalized abdominal pain for 8 hours. EXAM: CT ABDOMEN AND PELVIS WITHOUT CONTRAST TECHNIQUE: Multidetector CT imaging of the abdomen and pelvis was performed following the standard protocol without IV contrast. COMPARISON:  01/13/2013 FINDINGS: The gallbladder is distended with sludge or noncalcified calculi. There is inflammatory stranding adjacent to the gallbladder and along the undersurface of the liver. There is no bile duct dilatation. No focal liver lesions are evident on this unenhanced scan. The pancreas is unremarkable.  No pancreatic duct dilatation. The spleen, adrenals and kidneys exhibit unremarkable unenhanced appearances. Ureters and urinary bladder are unremarkable. There are normal appearances of the stomach, small bowel and colon.  The appendix is normal. The abdominal aorta has a mild infrarenal fusiform dilatation but maximum AP diameter is only 2.9 cm. It resumes normal caliber at the bifurcation. There is no extraluminal air.  There is no ascites. There is no significant skeletal lesion. There is prior instrumented fusion at L4-5 with pedicle screw fixation and interbody fusion cage. There is no significant abnormality in the lower chest. IMPRESSION: Abnormal gallbladder, appearing distended and thickened with adjacent inflammatory stranding. The stranding opacities continue along the undersurface of the liver. Suspicious for cholecystitis. Consider sonography for better characterization. These results will be called to the ordering clinician or representative by the Radiologist Assistant, and communication documented in the PACS or zVision Dashboard. Electronically Signed   By: Andreas Newport M.D.   On: 04/05/2016 03:21   US Abdomen Complete  04/05/2016  CLINICAL DATA:  Right upper quadrant pain.  Cholecystitis. EXAM: ABDOMEN ULTRASOUND COMPLETE COMPARISON:  CT 04/05/2016 FINDINGS: Gallbladder: Over distended with 1 cm wall thickness that is heterogeneous and striated. There is no focal tenderness, but this could be confounded by analgesic administration. Internally there is echogenic and partially shadowing material likely calculi and hemorrhage as seen on previous CT. Cannot exclude a gallbladder mass. Common bile duct: Diameter: 3 mm were seen.  Initial encounter. Liver: No focal lesion identified. Within normal limits in parenchymal echogenicity. IVC: No abnormality visualized. Pancreas: Essentially nonvisualized due to bowel gas. Recent visualization by CT. Spleen: Size and appearance within normal limits. Fluid-filled structure and neighboring the spleen is stomach based on comparison CT. Right Kidney: Length: 13 cm. Echogenicity within normal limits. No mass or hydronephrosis visualized. Left Kidney: Length: 12 cm.  Echogenicity within normal limits. No mass or hydronephrosis visualized. Abdominal aorta: No aneurysm visualized sonographically. Ectatic distal aorta 29 mm by CT. IMPRESSION: 1. Findings consistent with acute cholecystitis. Murphy's sign is negative, but could be confounded by analgesic medication or advanced disease with insensate gallbladder. Extensive debris in the gallbladder lumen, at least partially hemorrhage based on previous CT. A gallbladder mass cannot be excluded. 2. No bile duct dilatation. Electronically Signed   By: Monte Fantasia M.D.   On: 04/05/2016 09:37     LOS: 1 day   Oren Binet, MD  [facility] Pager:[phone]  If 7PM-7AM, please contact night-coverage [email or web address] Password [email or web address] 04/06/2016, 8:29 AM

## 2016-04-06 NOTE — Progress Notes (Signed)
Subjective: Denies any significant abdominal pain. Does feel gassy.  Objective: Vital signs in last 24 hours: Temp:  [98.4 F (36.9 C)-99.1 F (37.3 C)] 98.4 F (36.9 C) (05/07 0420) Pulse Rate:  [72-77] 77 (05/07 0420) Resp:  [18-20] 20 (05/07 0420) BP: (107-116)/(52-72) 111/59 mmHg (05/07 0420) SpO2:  [95 %-97 %] 95 % (05/07 0420) Last BM Date: 04/04/16  Intake/Output from previous day: 05/06 0701 - 05/07 0700 In: 0  Out: 750 [Urine:750] Intake/Output this shift:    General appearance: alert, cooperative and no distress Resp: clear to auscultation bilaterally Cardio: regular rate and rhythm, S1, S2 normal, no murmur, click, rub or gallop GI: Soft with minimal tenderness right upper quadrant to palpation. No rigidity is noted. Bowel sounds are active.  Lab Results:   Recent Labs  04/05/16 0548 04/06/16 0542  WBC 25.4* 27.9*  HGB 11.6* 10.9*  HCT 36.1* 33.3*  PLT 233 216   BMET  Recent Labs  04/05/16 0548 04/06/16 0542  NA 138 135  K 3.5 3.6  CL 101 99*  CO2 27 27  GLUCOSE 185* 160*  BUN 17 21*  CREATININE 0.95 1.01  CALCIUM 8.4* 8.0*   PT/INR  Recent Labs  04/05/16 0548  LABPROT 16.3*  INR 1.29    Studies/Results: Ct Abdomen Pelvis Wo Contrast  04/05/2016  CLINICAL DATA:  Sudden onset generalized abdominal pain for 8 hours. EXAM: CT ABDOMEN AND PELVIS WITHOUT CONTRAST TECHNIQUE: Multidetector CT imaging of the abdomen and pelvis was performed following the standard protocol without IV contrast. COMPARISON:  01/13/2013 FINDINGS: The gallbladder is distended with sludge or noncalcified calculi. There is inflammatory stranding adjacent to the gallbladder and along the undersurface of the liver. There is no bile duct dilatation. No focal liver lesions are evident on this unenhanced scan. The pancreas is unremarkable.  No pancreatic duct dilatation. The spleen, adrenals and kidneys exhibit unremarkable unenhanced appearances. Ureters and urinary bladder  are unremarkable. There are normal appearances of the stomach, small bowel and colon. The appendix is normal. The abdominal aorta has a mild infrarenal fusiform dilatation but maximum AP diameter is only 2.9 cm. It resumes normal caliber at the bifurcation. There is no extraluminal air.  There is no ascites. There is no significant skeletal lesion. There is prior instrumented fusion at L4-5 with pedicle screw fixation and interbody fusion cage. There is no significant abnormality in the lower chest. IMPRESSION: Abnormal gallbladder, appearing distended and thickened with adjacent inflammatory stranding. The stranding opacities continue along the undersurface of the liver. Suspicious for cholecystitis. Consider sonography for better characterization. These results will be called to the ordering clinician or representative by the Radiologist Assistant, and communication documented in the PACS or zVision Dashboard. Electronically Signed   By: Andreas Newport M.D.   On: 04/05/2016 03:21   US Abdomen Complete  04/05/2016  CLINICAL DATA:  Right upper quadrant pain.  Cholecystitis. EXAM: ABDOMEN ULTRASOUND COMPLETE COMPARISON:  CT 04/05/2016 FINDINGS: Gallbladder: Over distended with 1 cm wall thickness that is heterogeneous and striated. There is no focal tenderness, but this could be confounded by analgesic administration. Internally there is echogenic and partially shadowing material likely calculi and hemorrhage as seen on previous CT. Cannot exclude a gallbladder mass. Common bile duct: Diameter: 3 mm were seen.  Initial encounter. Liver: No focal lesion identified. Within normal limits in parenchymal echogenicity. IVC: No abnormality visualized. Pancreas: Essentially nonvisualized due to bowel gas. Recent visualization by CT. Spleen: Size and appearance within normal limits. Fluid-filled structure and  neighboring the spleen is stomach based on comparison CT. Right Kidney: Length: 13 cm. Echogenicity within normal  limits. No mass or hydronephrosis visualized. Left Kidney: Length: 12 cm. Echogenicity within normal limits. No mass or hydronephrosis visualized. Abdominal aorta: No aneurysm visualized sonographically. Ectatic distal aorta 29 mm by CT. IMPRESSION: 1. Findings consistent with acute cholecystitis. Murphy's sign is negative, but could be confounded by analgesic medication or advanced disease with insensate gallbladder. Extensive debris in the gallbladder lumen, at least partially hemorrhage based on previous CT. A gallbladder mass cannot be excluded. 2. No bile duct dilatation. Electronically Signed   By: Monte Fantasia M.D.   On: 04/05/2016 09:37    Anti-infectives: Anti-infectives    Start     Dose/Rate Route Frequency Ordered Stop   04/05/16 2300  vancomycin (VANCOCIN) 1,250 mg in sodium chloride 0.9 % 250 mL IVPB     1,250 mg 166.7 mL/hr over 90 Minutes Intravenous Every 12 hours 04/05/16 0954     04/05/16 1200  piperacillin-tazobactam (ZOSYN) IVPB 3.375 g     3.375 g 12.5 mL/hr over 240 Minutes Intravenous Every 8 hours 04/05/16 0730     04/05/16 1100  vancomycin (VANCOCIN) 1,500 mg in sodium chloride 0.9 % 500 mL IVPB     1,500 mg 250 mL/hr over 120 Minutes Intravenous  Once 04/05/16 0952 04/05/16 1251   04/05/16 0345  piperacillin-tazobactam (ZOSYN) IVPB 3.375 g     3.375 g 12.5 mL/hr over 240 Minutes Intravenous  Once 04/05/16 0340 04/05/16 0800      Assessment/Plan: Impression: Acute acalculous cholecystitis, history of Xeralto use. Plan: We'll proceed with open cholecystectomy tomorrow. This will be 48 hours after his last dose of Xeralto. Risks and benefits of the procedure including bleeding, infection, the need for blood transfusion, cardiopulmonary difficulties, and the possibility of hepatobiliary injury were fully explained to the patient, who gave informed consent.  LOS: 1 day    Jared Bridges A 04/06/2016

## 2016-04-07 ENCOUNTER — Inpatient Hospital Stay (HOSPITAL_COMMUNITY): Payer: Medicare Other

## 2016-04-07 ENCOUNTER — Encounter (HOSPITAL_COMMUNITY): Payer: Self-pay | Admitting: *Deleted

## 2016-04-07 ENCOUNTER — Inpatient Hospital Stay (HOSPITAL_COMMUNITY): Payer: Medicare Other | Admitting: Anesthesiology

## 2016-04-07 ENCOUNTER — Encounter (HOSPITAL_COMMUNITY)
Admission: EM | Disposition: A | Discharge status: Home Health | Payer: Self-pay | Source: Home / Self Care | Attending: Internal Medicine

## 2016-04-07 DIAGNOSIS — M353 Polymyalgia rheumatica: Secondary | ICD-10-CM

## 2016-04-07 HISTORY — PX: CHOLECYSTECTOMY: SHX55

## 2016-04-07 LAB — GLUCOSE, CAPILLARY
GLUCOSE-CAPILLARY: 170 mg/dL — AB (ref 65–99)
GLUCOSE-CAPILLARY: 186 mg/dL — AB (ref 65–99)
Glucose-Capillary: 133 mg/dL — ABNORMAL HIGH (ref 65–99)
Glucose-Capillary: 147 mg/dL — ABNORMAL HIGH (ref 65–99)
Glucose-Capillary: 194 mg/dL — ABNORMAL HIGH (ref 65–99)

## 2016-04-07 LAB — COMPREHENSIVE METABOLIC PANEL
ALBUMIN: 2.6 g/dL — AB (ref 3.5–5.0)
ALK PHOS: 109 U/L (ref 38–126)
ALT: 208 U/L — AB (ref 17–63)
ANION GAP: 6 (ref 5–15)
AST: 225 U/L — ABNORMAL HIGH (ref 15–41)
BUN: 23 mg/dL — ABNORMAL HIGH (ref 6–20)
CALCIUM: 8.1 mg/dL — AB (ref 8.9–10.3)
CHLORIDE: 102 mmol/L (ref 101–111)
CO2: 28 mmol/L (ref 22–32)
Creatinine, Ser: 0.98 mg/dL (ref 0.61–1.24)
GFR calc non Af Amer: >60 mL/min (ref >60)
GLUCOSE: 133 mg/dL — AB (ref 65–99)
Potassium: 3.5 mmol/L (ref 3.5–5.1)
SODIUM: 136 mmol/L (ref 135–145)
Total Bilirubin: 4.2 mg/dL — ABNORMAL HIGH (ref 0.3–1.2)
Total Protein: 5.8 g/dL — ABNORMAL LOW (ref 6.5–8.1)

## 2016-04-07 LAB — CBC
HCT: 31.4 % — ABNORMAL LOW (ref 39.0–52.0)
HCT: 34.8 % — ABNORMAL LOW (ref 39.0–52.0)
HEMOGLOBIN: 10.3 g/dL — AB (ref 13.0–17.0)
Hemoglobin: 11.7 g/dL — ABNORMAL LOW (ref 13.0–17.0)
MCH: 31.7 pg (ref 26.0–34.0)
MCH: 31.4 pg (ref 26.0–34.0)
MCHC: 32.8 g/dL (ref 30.0–36.0)
MCHC: 33.6 g/dL (ref 30.0–36.0)
MCV: 96.6 fL (ref 78.0–100.0)
MCV: 93.3 fL (ref 78.0–100.0)
PLATELETS: 209 10*3/uL (ref 150–400)
PLATELETS: 198 10*3/uL (ref 150–400)
RBC: 3.25 MIL/uL — AB (ref 4.22–5.81)
RBC: 3.73 MIL/uL — ABNORMAL LOW (ref 4.22–5.81)
RDW: 13.7 % (ref 11.5–15.5)
RDW: 14.6 % (ref 11.5–15.5)
WBC: 20 10*3/uL — ABNORMAL HIGH (ref 4.0–10.5)
WBC: 20.4 10*3/uL — ABNORMAL HIGH (ref 4.0–10.5)

## 2016-04-07 LAB — BASIC METABOLIC PANEL
Anion gap: 6 (ref 5–15)
BUN: 24 mg/dL — AB (ref 6–20)
CHLORIDE: 103 mmol/L (ref 101–111)
CO2: 25 mmol/L (ref 22–32)
CREATININE: 1.04 mg/dL (ref 0.61–1.24)
Calcium: 7.3 mg/dL — ABNORMAL LOW (ref 8.9–10.3)
GFR calc Af Amer: >60 mL/min (ref >60)
GFR calc non Af Amer: >60 mL/min (ref >60)
Glucose, Bld: 184 mg/dL — ABNORMAL HIGH (ref 65–99)
Potassium: 4.1 mmol/L (ref 3.5–5.1)
Sodium: 134 mmol/L — ABNORMAL LOW (ref 135–145)

## 2016-04-07 LAB — URINE CULTURE

## 2016-04-07 LAB — HEMOGLOBIN A1C
HEMOGLOBIN A1C: 6.3 % — AB (ref 4.8–5.6)
MEAN PLASMA GLUCOSE: 134 mg/dL

## 2016-04-07 SURGERY — CHOLECYSTECTOMY
Anesthesia: General | Site: Abdomen

## 2016-04-07 MED ORDER — GLYCOPYRROLATE 0.2 MG/ML IJ SOLN
INTRAMUSCULAR | Status: AC
  Filled 2016-04-07: qty 1

## 2016-04-07 MED ORDER — ALBUTEROL SULFATE HFA 108 (90 BASE) MCG/ACT IN AERS
INHALATION_SPRAY | RESPIRATORY_TRACT | Status: DC | PRN
  Administered 2016-04-07: 3 via RESPIRATORY_TRACT

## 2016-04-07 MED ORDER — ACETAMINOPHEN 325 MG PO TABS: 650.0000 mg | ORAL_TABLET | Freq: Four times a day (QID) | ORAL | Status: DC | PRN

## 2016-04-07 MED ORDER — BUPIVACAINE HCL (PF) 0.5 % IJ SOLN
INTRAMUSCULAR | Status: AC
  Filled 2016-04-07: qty 30

## 2016-04-07 MED ORDER — METOPROLOL TARTRATE 5 MG/5ML IV SOLN
INTRAVENOUS | Status: AC
  Filled 2016-04-07: qty 5

## 2016-04-07 MED ORDER — ETOMIDATE 2 MG/ML IV SOLN
INTRAVENOUS | Status: AC
  Filled 2016-04-07: qty 10

## 2016-04-07 MED ORDER — GLYCOPYRROLATE 0.2 MG/ML IJ SOLN
INTRAMUSCULAR | Status: DC | PRN
  Administered 2016-04-07: 0.4 mg via INTRAVENOUS
  Administered 2016-04-07: 0.2 mg via ORAL

## 2016-04-07 MED ORDER — 0.9 % SODIUM CHLORIDE (POUR BTL) OPTIME
TOPICAL | Status: DC | PRN
  Administered 2016-04-07 (×2): 1000 mL

## 2016-04-07 MED ORDER — SUCCINYLCHOLINE CHLORIDE 20 MG/ML IJ SOLN
INTRAMUSCULAR | Status: AC
  Filled 2016-04-07: qty 1

## 2016-04-07 MED ORDER — METOPROLOL TARTRATE 5 MG/5ML IV SOLN
INTRAVENOUS | Status: DC | PRN
  Administered 2016-04-07: 3 mg via INTRAVENOUS
  Administered 2016-04-07: 2 mg via INTRAVENOUS

## 2016-04-07 MED ORDER — ONDANSETRON HCL 4 MG/2ML IJ SOLN: 4.0000 mg | Freq: Once | INTRAMUSCULAR | Status: DC | PRN

## 2016-04-07 MED ORDER — PHENYLEPHRINE HCL 10 MG/ML IJ SOLN
0.0000 ug/min | INTRAVENOUS | Status: DC
  Filled 2016-04-07: qty 1

## 2016-04-07 MED ORDER — LACTATED RINGERS IV SOLN
INTRAVENOUS | Status: DC
  Administered 2016-04-07 (×2): via INTRAVENOUS

## 2016-04-07 MED ORDER — SODIUM CHLORIDE 0.9 % IJ SOLN
INTRAMUSCULAR | Status: AC
  Filled 2016-04-07: qty 10

## 2016-04-07 MED ORDER — ACETAMINOPHEN 650 MG RE SUPP: 650.0000 mg | Freq: Four times a day (QID) | RECTAL | Status: DC | PRN

## 2016-04-07 MED ORDER — THROMBIN 20000 UNITS EX SOLR
CUTANEOUS | Status: DC | PRN
  Administered 2016-04-07: 9 mL via TOPICAL

## 2016-04-07 MED ORDER — HYDROCORTISONE NA SUCCINATE PF 100 MG IJ SOLR
50.0000 mg | Freq: Four times a day (QID) | INTRAMUSCULAR | Status: DC
  Administered 2016-04-07 – 2016-04-08 (×3): 50 mg via INTRAVENOUS
  Filled 2016-04-07 (×3): qty 2

## 2016-04-07 MED ORDER — SUCCINYLCHOLINE CHLORIDE 20 MG/ML IJ SOLN
INTRAMUSCULAR | Status: DC | PRN
  Administered 2016-04-07: 120 mg via INTRAVENOUS

## 2016-04-07 MED ORDER — ROCURONIUM BROMIDE 100 MG/10ML IV SOLN
INTRAVENOUS | Status: DC | PRN
  Administered 2016-04-07: 5 mg via INTRAVENOUS
  Administered 2016-04-07: 25 mg via INTRAVENOUS
  Administered 2016-04-07: 10 mg via INTRAVENOUS

## 2016-04-07 MED ORDER — SODIUM CHLORIDE 0.9 % IV SOLN
INTRAVENOUS | Status: DC | PRN
  Administered 2016-04-07: 12:00:00 via INTRAVENOUS

## 2016-04-07 MED ORDER — MIDAZOLAM HCL 2 MG/2ML IJ SOLN
INTRAMUSCULAR | Status: AC
  Filled 2016-04-07: qty 2

## 2016-04-07 MED ORDER — LACTATED RINGERS IV SOLN
INTRAVENOUS | Status: DC
  Administered 2016-04-07 – 2016-04-08 (×4): via INTRAVENOUS

## 2016-04-07 MED ORDER — PHENYLEPHRINE HCL 10 MG/ML IJ SOLN
INTRAMUSCULAR | Status: DC | PRN
  Administered 2016-04-07: 40 ug via INTRAVENOUS
  Administered 2016-04-07 (×3): 80 ug via INTRAVENOUS
  Administered 2016-04-07: 40 ug via INTRAVENOUS

## 2016-04-07 MED ORDER — BUPIVACAINE HCL (PF) 0.5 % IJ SOLN
INTRAMUSCULAR | Status: DC | PRN
  Administered 2016-04-07: 10 mL

## 2016-04-07 MED ORDER — FENTANYL CITRATE (PF) 100 MCG/2ML IJ SOLN: 25.0000 ug | INTRAMUSCULAR | Status: DC | PRN

## 2016-04-07 MED ORDER — HEMOSTATIC AGENTS (NO CHARGE) OPTIME
TOPICAL | Status: DC | PRN
  Administered 2016-04-07: 1 via TOPICAL

## 2016-04-07 MED ORDER — NEOSTIGMINE METHYLSULFATE 10 MG/10ML IV SOLN
INTRAVENOUS | Status: DC | PRN
  Administered 2016-04-07: 2 mg via INTRAVENOUS

## 2016-04-07 MED ORDER — ALBUTEROL SULFATE HFA 108 (90 BASE) MCG/ACT IN AERS
INHALATION_SPRAY | RESPIRATORY_TRACT | Status: AC
  Filled 2016-04-07: qty 6.7

## 2016-04-07 MED ORDER — ARTIFICIAL TEARS OP OINT
TOPICAL_OINTMENT | OPHTHALMIC | Status: AC
  Filled 2016-04-07: qty 3.5

## 2016-04-07 MED ORDER — SUFENTANIL CITRATE 50 MCG/ML IV SOLN
INTRAVENOUS | Status: DC | PRN
  Administered 2016-04-07: 10 ug via INTRAVENOUS
  Administered 2016-04-07: 5 ug via INTRAVENOUS
  Administered 2016-04-07: 10 ug via INTRAVENOUS
  Administered 2016-04-07: 5 ug via INTRAVENOUS

## 2016-04-07 MED ORDER — PHENYLEPHRINE HCL 10 MG/ML IJ SOLN
10.0000 mg | INTRAVENOUS | Status: DC | PRN
  Administered 2016-04-07: 3 ug via INTRAVENOUS

## 2016-04-07 MED ORDER — ARTIFICIAL TEARS OP OINT
TOPICAL_OINTMENT | OPHTHALMIC | Status: DC | PRN
  Administered 2016-04-07: 1 via OPHTHALMIC

## 2016-04-07 MED ORDER — GLYCOPYRROLATE 0.2 MG/ML IJ SOLN
INTRAMUSCULAR | Status: AC
  Filled 2016-04-07: qty 2

## 2016-04-07 MED ORDER — MIDAZOLAM HCL 2 MG/2ML IJ SOLN
1.0000 mg | INTRAMUSCULAR | Status: DC | PRN
  Administered 2016-04-07: 2 mg via INTRAVENOUS

## 2016-04-07 MED ORDER — SUFENTANIL CITRATE 50 MCG/ML IV SOLN
INTRAVENOUS | Status: AC
  Filled 2016-04-07: qty 1

## 2016-04-07 MED ORDER — NEOSTIGMINE METHYLSULFATE 10 MG/10ML IV SOLN
INTRAVENOUS | Status: AC
  Filled 2016-04-07: qty 1

## 2016-04-07 MED ORDER — ETOMIDATE 2 MG/ML IV SOLN
INTRAVENOUS | Status: DC | PRN
  Administered 2016-04-07: 14 mg via INTRAVENOUS

## 2016-04-07 SURGICAL SUPPLY — 62 items
APPLIER CLIP 11 MED OPEN (CLIP)
APPLIER CLIP 13 LRG OPEN (CLIP) ×3
BAG HAMPER (MISCELLANEOUS) ×3 IMPLANT
CELLS DAT CNTRL 66122 CELL SVR (MISCELLANEOUS) IMPLANT
CHLORAPREP W/TINT 26ML (MISCELLANEOUS) ×3 IMPLANT
CHOLANGIOGRAM CATH TAUT (CATHETERS) IMPLANT
CLEANER TIP ELECTROSURG 2X2 (MISCELLANEOUS) ×3 IMPLANT
CLIP APPLIE 11 MED OPEN (CLIP) IMPLANT
CLIP APPLIE 13 LRG OPEN (CLIP) ×1 IMPLANT
CLOTH BEACON ORANGE TIMEOUT ST (SAFETY) ×3 IMPLANT
COVER LIGHT HANDLE STERIS (MISCELLANEOUS) ×6 IMPLANT
COVER MAYO STAND XLG (DRAPE) IMPLANT
DECANTER SPIKE VIAL GLASS SM (MISCELLANEOUS) ×3 IMPLANT
DRAPE C-ARM FOLDED MOBILE STRL (DRAPES) IMPLANT
DRAPE WARM FLUID 44X44 (DRAPE) ×3 IMPLANT
ELECT BLADE 6 FLAT ULTRCLN (ELECTRODE) ×3 IMPLANT
ELECT REM PT RETURN 9FT ADLT (ELECTROSURGICAL) ×3
ELECTRODE REM PT RTRN 9FT ADLT (ELECTROSURGICAL) ×1 IMPLANT
EVACUATOR DRAINAGE 10X20 100CC (DRAIN) ×1 IMPLANT
EVACUATOR SILICONE 100CC (DRAIN) ×2
FORMALIN 10 PREFIL 480ML (MISCELLANEOUS) ×3 IMPLANT
GAUZE SPONGE 4X4 12PLY STRL (GAUZE/BANDAGES/DRESSINGS) ×3 IMPLANT
GLOVE BIOGEL PI IND STRL 7.0 (GLOVE) ×4 IMPLANT
GLOVE BIOGEL PI INDICATOR 7.0 (GLOVE) ×8
GLOVE SKINSENSE NS SZ7.0 (GLOVE) ×2
GLOVE SKINSENSE STRL SZ7.0 (GLOVE) ×1 IMPLANT
GLOVE SURG SS PI 6.5 STRL IVOR (GLOVE) ×3 IMPLANT
GLOVE SURG SS PI 7.0 STRL IVOR (GLOVE) ×6 IMPLANT
GLOVE SURG SS PI 7.5 STRL IVOR (GLOVE) ×3 IMPLANT
GOWN STRL REUS W/TWL LRG LVL3 (GOWN DISPOSABLE) ×15 IMPLANT
HEMOSTAT SNOW SURGICEL 2X4 (HEMOSTASIS) ×3 IMPLANT
INST SET MAJOR GENERAL (KITS) ×3 IMPLANT
KIT ROOM TURNOVER APOR (KITS) ×3 IMPLANT
MANIFOLD NEPTUNE II (INSTRUMENTS) ×3 IMPLANT
NEEDLE HYPO 25X1 1.5 SAFETY (NEEDLE) ×3 IMPLANT
NS IRRIG 1000ML POUR BTL (IV SOLUTION) ×6 IMPLANT
PACK ABDOMINAL MAJOR (CUSTOM PROCEDURE TRAY) ×3 IMPLANT
PAD ARMBOARD 7.5X6 YLW CONV (MISCELLANEOUS) ×3 IMPLANT
RTRCTR WOUND ALEXIS 18CM MED (MISCELLANEOUS)
SET BASIN LINEN APH (SET/KITS/TRAYS/PACK) ×3 IMPLANT
SPONGE DRAIN TRACH 4X4 STRL 2S (GAUZE/BANDAGES/DRESSINGS) ×3 IMPLANT
SPONGE GAUZE 4X4 STERILE 39 (GAUZE/BANDAGES/DRESSINGS) ×3 IMPLANT
SPONGE INTESTINAL PEANUT (DISPOSABLE) ×3 IMPLANT
SPONGE LAP 18X18 X RAY DECT (DISPOSABLE) ×3 IMPLANT
SPONGE LAP 4X18 X RAY DECT (DISPOSABLE) ×3 IMPLANT
SPONGE SURGIFOAM ABS GEL 100 (HEMOSTASIS) ×3 IMPLANT
STAPLER VISISTAT (STAPLE) ×3 IMPLANT
SURGIFLO W/THROMBIN 8M KIT (HEMOSTASIS) ×3 IMPLANT
SUT ETHILON 3 0 FSL (SUTURE) ×3 IMPLANT
SUT SILK 2 0 (SUTURE)
SUT SILK 2 0 SH (SUTURE) ×9 IMPLANT
SUT SILK 2-0 18XBRD TIE 12 (SUTURE) IMPLANT
SUT VIC AB 0 CT1 27 (SUTURE) ×8
SUT VIC AB 0 CT1 27XBRD ANTBC (SUTURE) ×4 IMPLANT
SYR 20CC LL (SYRINGE) IMPLANT
SYR 30ML LL (SYRINGE) IMPLANT
SYR CONTROL 10ML LL (SYRINGE) ×3 IMPLANT
SYRINGE 10CC LL (SYRINGE) ×3 IMPLANT
TAPE PAPER MEDFIX 1IN X 10YD (GAUZE/BANDAGES/DRESSINGS) ×3 IMPLANT
TOWEL BLUE STERILE X RAY DET (MISCELLANEOUS) IMPLANT
TRAY FOLEY CATH SILVER 16FR (SET/KITS/TRAYS/PACK) IMPLANT
YANKAUER SUCT BULB TIP NO VENT (SUCTIONS) ×6 IMPLANT

## 2016-04-07 NOTE — Anesthesia Postprocedure Evaluation (Signed)
Anesthesia Post Note  Patient: Jared Bridges  Procedure(s) Performed: Procedure(s) (LRB): CHOLECYSTECTOMY (N/A)  Patient location during evaluation: PACU Anesthesia Type: General Level of consciousness: awake and alert Pain management: pain level controlled Vital Signs Assessment: post-procedure vital signs reviewed and stable Respiratory status: spontaneous breathing and non-rebreather facemask Cardiovascular status: stable Anesthetic complications: no    Last Vitals:  Filed Vitals:   04/07/16 1400 04/07/16 1415  BP: 81/57 111/63  Pulse: 74 66  Temp:    Resp: 28 27    Last Pain:  Filed Vitals:   04/07/16 1424  PainSc: 2                  [name]

## 2016-04-07 NOTE — Transfer of Care (Signed)
Immediate Anesthesia Transfer of Care Note  Patient: Jared Bridges  Procedure(s) Performed: Procedure(s): CHOLECYSTECTOMY (N/A)  Patient Location: PACU  Anesthesia Type:General  Level of Consciousness: awake and patient cooperative  Airway & Oxygen Therapy: Patient Spontanous Breathing and non-rebreather face mask  Post-op Assessment: Report given to RN, Post -op Vital signs reviewed and stable and Patient moving all extremities  Post vital signs: Reviewed and stable    Last Pain:  Filed Vitals:   04/07/16 1000  PainSc: 2       Patients Stated Pain Goal: 2 ([id number])  Complications: No apparent anesthesia complications

## 2016-04-07 NOTE — Progress Notes (Signed)
PROGRESS NOTE        PATIENT DETAILS Name: Jared Bridges Age: 82 y.o. Sex: male Date of Birth: Apr 09, 1930 Admit Date: 04/04/2016 Admitting Physician Oswald Hillock, MD [id number] C, MD Outpatient Specialists: Dr. Domenic Polite  Brief Narrative: Patient is a 82 y.o. male prior CVA without any focal deficits, chronic migraine headaches, dyslipidemia, recent left knee replacement-on prophylactic Xarelto (last dose 5/5 am)- who presented with right upper quadrant abdominal pain. CT of the abdomen and ultrasound of the gallbladder suspicious for acute cholecystitis.   Subjective: Abdominal pain slowly improving. Denies any nausea or vomiting.  Assessment/Plan: Active Problems: Acute cholecystitis: abdominal pain better with supportive measures and IV antibiotics. LFTs significantly deranged today. Continue empiric antibiotics. Gen. surgery consulted, plans are for cholecystectomy 5/8.   Recent left total knee revision: was on prophylactic Xarelto prior to this admission-last dose on 5/5 AM. Xarelto has not been discontinued, will need to be initiated DVT prophylaxis post surgery. Left knee-with no obvious infection-however has skin discoloration.   History of CVA: Nonfocal exam-continue statin   History of aortic stenosis: Systolic murmur [id number] echocardiogram January 2017 shows mild aortic stenosis. Tolerated recent left knee revision arthroplasty pretty well.  History of migraine headaches: Currently headache free-continue Depakote and Lyrica  Hypertension:Not on any antihypertensives-blood pressure well controlled.  History of diabetes: Not on oral hypoglycemic agents at home-CBGs stable with SSI. Follow.  History of peptic ulcer disease: continue Pepcid  History of polymyalgia rheumatica: Steroids have been weaned down to prednisone 5 mg twice a week for the past 1 month. Prior to this patient was on daily steroids. Currently appears stable-we will  resume his usual prednisone dosing-if he becomes acutely ill or worsens, will need stress dosing of steroids.  DVT Prophylaxis: SCD's  Code Status: Full code   Family Communication: None at bedside   Disposition Plan: Remain inpatient-but will plan on Home health vs SNF on discharge  Antimicrobial agents: IV Zosyn 5/6>>  Procedures: None  CONSULTS:  general surgery  Time spent: 25 minutes-Greater than 50% of this time was spent in counseling, explanation of diagnosis, planning of further management, and coordination of care.  MEDICATIONS: Anti-infectives    Start     Dose/Rate Route Frequency Ordered Stop   04/05/16 2300  vancomycin (VANCOCIN) 1,250 mg in sodium chloride 0.9 % 250 mL IVPB     1,250 mg 166.7 mL/hr over 90 Minutes Intravenous Every 12 hours 04/05/16 0954     04/05/16 1200  piperacillin-tazobactam (ZOSYN) IVPB 3.375 g     3.375 g 12.5 mL/hr over 240 Minutes Intravenous Every 8 hours 04/05/16 0730     04/05/16 1100  vancomycin (VANCOCIN) 1,500 mg in sodium chloride 0.9 % 500 mL IVPB     1,500 mg 250 mL/hr over 120 Minutes Intravenous  Once 04/05/16 0952 04/05/16 1251   04/05/16 0345  piperacillin-tazobactam (ZOSYN) IVPB 3.375 g     3.375 g 12.5 mL/hr over 240 Minutes Intravenous  Once 04/05/16 0340 04/05/16 0800      Scheduled Meds: . atorvastatin  40 mg Oral q1800  . divalproex  250 mg Oral q morning - 10a  . famotidine (PEPCID) IV  20 mg Intravenous Q24H  . insulin aspart  0-9 Units Subcutaneous TID WC  . piperacillin-tazobactam (ZOSYN)  IV  3.375 g Intravenous Q8H  . predniSONE  5 mg Oral Once  per day on Mon Thu  . pregabalin  25 mg Oral QHS  . vancomycin  1,250 mg Intravenous Q12H  . venlafaxine XR  75 mg Oral Daily   Continuous Infusions: . dextrose 5 % and 0.45 % NaCl with KCl 20 mEq/L 75 mL/hr at 04/06/16 1054   PRN Meds:.clonazePAM, morphine injection, ondansetron **OR** ondansetron (ZOFRAN) IV, simethicone   PHYSICAL EXAM: Vital  signs: Filed Vitals:   04/06/16 0420 04/06/16 1610 04/06/16 2207 04/07/16 0538  BP: 111/59 132/58 128/50 115/56  Pulse: 77 85 79 73  Temp: 98.4 F (36.9 C) 99.8 F (37.7 C) 99.4 F (37.4 C) 98.9 F (37.2 C)  TempSrc: Oral Oral Oral Oral  Resp: 20 20 20 20   Height:      Weight:      SpO2: 95% 91% 92% 93%   Filed Weights   04/04/16 2331 04/05/16 0444  Weight: 97.07 kg (214 lb) 92.171 kg (203 lb 3.2 oz)   Body mass index is 26.08 kg/(m^2).   Gen Exam: Awake and alert with clear speech. Not in any distress  Neck: Supple, No JVD.   Chest: B/L Clear.   CVS: S1 S2 Regular, 3/6 systolic murmur.  Abdomen: soft, BS +,Continues to have some tenderness in the right upper quadrant area, non distended.  Extremities: no edema, lower extremities warm to touch. Neurologic: Non Focal.   Skin: No Rash or lesions   Wounds: N/A.    LABORATORY DATA: CBC:  Recent Labs Lab 04/05/16 0001 04/05/16 0548 04/06/16 0542 04/07/16 0509  WBC 12.7* 25.4* 27.9* 20.0*  NEUTROABS 11.0*  --   --   --   HGB 11.6* 11.6* 10.9* 10.3*  HCT 35.4* 36.1* 33.3* 31.4*  MCV 95.9 96.0 97.9 96.6  PLT 227 233 216 [id number]    Basic Metabolic Panel:  Recent Labs Lab 04/05/16 0001 04/05/16 0548 04/06/16 0542 04/07/16 0509  NA 137 138 135 136  K 3.6 3.5 3.6 3.5  CL 99* 101 99* 102  CO2 29 27 27 28   GLUCOSE 213* 185* 160* 133*  BUN 18 17 21* 23*  CREATININE 0.93 0.95 1.01 0.98  CALCIUM 8.5* 8.4* 8.0* 8.1*    GFR: Estimated Creatinine Clearance: 66.4 mL/min (by C-G formula based on Cr of 0.98).  Liver Function Tests:  Recent Labs Lab 04/05/16 0001 04/05/16 0548 04/06/16 0542 04/07/16 0509  AST 22 24 25  225*  ALT 16* 19 23 208*  ALKPHOS 74 67 57 109  BILITOT 0.8 1.0 1.3* 4.2*  PROT 6.6 6.6 6.1* 5.8*  ALBUMIN 3.5 3.4* 2.9* 2.6*    Recent Labs Lab 04/05/16 0001  LIPASE 14   No results for input(s): AMMONIA in the last 168 hours.  Coagulation Profile:  Recent Labs Lab 04/05/16 0548    INR 1.29    Cardiac Enzymes: No results for input(s): CKTOTAL, CKMB, CKMBINDEX, TROPONINI in the last 168 hours.  BNP (last 3 results) No results for input(s): PROBNP in the last 8760 hours.  HbA1C: No results for input(s): HGBA1C in the last 72 hours.  CBG:  Recent Labs Lab 04/06/16 0740 04/06/16 1114 04/06/16 1651 04/06/16 2210 04/07/16 0743  GLUCAP 140* 161* 152* 149* 133*    Lipid Profile: No results for input(s): CHOL, HDL, LDLCALC, TRIG, CHOLHDL, LDLDIRECT in the last 72 hours.  Thyroid Function Tests: No results for input(s): TSH, T4TOTAL, FREET4, T3FREE, THYROIDAB in the last 72 hours.  Anemia Panel: No results for input(s): VITAMINB12, FOLATE, FERRITIN, TIBC, IRON, RETICCTPCT in the last 72  hours.  Urine analysis:    Component Value Date/Time   COLORURINE YELLOW 04/04/2016 2347   APPEARANCEUR CLEAR 04/04/2016 2347   LABSPEC 1.015 04/04/2016 2347   PHURINE 8.0 04/04/2016 2347   GLUCOSEU NEGATIVE 04/04/2016 2347   HGBUR SMALL* 04/04/2016 2347   BILIRUBINUR NEGATIVE 04/04/2016 2347   KETONESUR 15* 04/04/2016 2347   PROTEINUR TRACE* 04/04/2016 2347   UROBILINOGEN 1.0 05/21/2015 1050   NITRITE NEGATIVE 04/04/2016 2347   LEUKOCYTESUR NEGATIVE 04/04/2016 2347    Sepsis Labs: Lactic Acid, Venous    Component Value Date/Time   LATICACIDVEN 1.9 04/05/2016 0001    MICROBIOLOGY: Recent Results (from the past 240 hour(s))  Urine culture     Status: None (Preliminary result)   Collection Time: 04/04/16 11:47 PM  Result Value Ref Range Status   Specimen Description URINE, CLEAN CATCH  Final   Special Requests NONE  Final   Culture   Final    NO GROWTH < 24 HOURS Performed at [facility]    Report Status PENDING  Incomplete  Surgical pcr screen     Status: None   Collection Time: 04/06/16 10:46 AM  Result Value Ref Range Status   MRSA, PCR NEGATIVE NEGATIVE Final   Staphylococcus aureus NEGATIVE NEGATIVE Final    Comment:        The Xpert  SA Assay (FDA approved for NASAL specimens in patients over 48 years of age), is one component of a comprehensive surveillance program.  Test performance has been validated by [facility] for patients greater than or equal to 56 year old. It is not intended to diagnose infection nor to guide or monitor treatment.     RADIOLOGY STUDIES/RESULTS: Ct Abdomen Pelvis Wo Contrast  04/05/2016  CLINICAL DATA:  Sudden onset generalized abdominal pain for 8 hours. EXAM: CT ABDOMEN AND PELVIS WITHOUT CONTRAST TECHNIQUE: Multidetector CT imaging of the abdomen and pelvis was performed following the standard protocol without IV contrast. COMPARISON:  01/13/2013 FINDINGS: The gallbladder is distended with sludge or noncalcified calculi. There is inflammatory stranding adjacent to the gallbladder and along the undersurface of the liver. There is no bile duct dilatation. No focal liver lesions are evident on this unenhanced scan. The pancreas is unremarkable.  No pancreatic duct dilatation. The spleen, adrenals and kidneys exhibit unremarkable unenhanced appearances. Ureters and urinary bladder are unremarkable. There are normal appearances of the stomach, small bowel and colon. The appendix is normal. The abdominal aorta has a mild infrarenal fusiform dilatation but maximum AP diameter is only 2.9 cm. It resumes normal caliber at the bifurcation. There is no extraluminal air.  There is no ascites. There is no significant skeletal lesion. There is prior instrumented fusion at L4-5 with pedicle screw fixation and interbody fusion cage. There is no significant abnormality in the lower chest. IMPRESSION: Abnormal gallbladder, appearing distended and thickened with adjacent inflammatory stranding. The stranding opacities continue along the undersurface of the liver. Suspicious for cholecystitis. Consider sonography for better characterization. These results will be called to the ordering clinician or representative by the  Radiologist Assistant, and communication documented in the PACS or zVision Dashboard. Electronically Signed   By: Andreas Newport M.D.   On: 04/05/2016 03:21   US Abdomen Complete  04/05/2016  CLINICAL DATA:  Right upper quadrant pain.  Cholecystitis. EXAM: ABDOMEN ULTRASOUND COMPLETE COMPARISON:  CT 04/05/2016 FINDINGS: Gallbladder: Over distended with 1 cm wall thickness that is heterogeneous and striated. There is no focal tenderness, but this could be confounded by  analgesic administration. Internally there is echogenic and partially shadowing material likely calculi and hemorrhage as seen on previous CT. Cannot exclude a gallbladder mass. Common bile duct: Diameter: 3 mm were seen.  Initial encounter. Liver: No focal lesion identified. Within normal limits in parenchymal echogenicity. IVC: No abnormality visualized. Pancreas: Essentially nonvisualized due to bowel gas. Recent visualization by CT. Spleen: Size and appearance within normal limits. Fluid-filled structure and neighboring the spleen is stomach based on comparison CT. Right Kidney: Length: 13 cm. Echogenicity within normal limits. No mass or hydronephrosis visualized. Left Kidney: Length: 12 cm. Echogenicity within normal limits. No mass or hydronephrosis visualized. Abdominal aorta: No aneurysm visualized sonographically. Ectatic distal aorta 29 mm by CT. IMPRESSION: 1. Findings consistent with acute cholecystitis. Murphy's sign is negative, but could be confounded by analgesic medication or advanced disease with insensate gallbladder. Extensive debris in the gallbladder lumen, at least partially hemorrhage based on previous CT. A gallbladder mass cannot be excluded. 2. No bile duct dilatation. Electronically Signed   By: Monte Fantasia M.D.   On: 04/05/2016 09:37     LOS: 2 days   Oren Binet, MD  [facility] Pager:[phone]  If 7PM-7AM, please contact night-coverage [email or web address] Password [email or web address] 04/07/2016, 8:08  AM

## 2016-04-07 NOTE — Anesthesia Preprocedure Evaluation (Signed)
Anesthesia Evaluation  Patient identified by MRN, date of birth, ID band Patient unresponsive    Reviewed: Allergy & Precautions, NPO status , Patient's Chart, lab work & pertinent test results, Unable to perform ROS - Chart review only  Airway Mallampati: II   Neck ROM: Full    Dental  (+) Teeth Intact   Pulmonary former smoker,    Pulmonary exam normal        Cardiovascular hypertension, Pt. on medications + Past MI and + Peripheral Vascular Disease  Normal cardiovascular exam+ dysrhythmias + Valvular Problems/Murmurs AS and AI      Neuro/Psych  Headaches, Anxiety Depression  Neuromuscular disease CVA, No Residual Symptoms    GI/Hepatic PUD,   Endo/Other  diabetes, Type 2, Oral Hypoglycemic Agents  Renal/GU      Musculoskeletal  (+) Arthritis , Osteoarthritis,    Abdominal Normal abdominal exam  (+)   Peds  Hematology  (+) anemia ,   Anesthesia Other Findings   Reproductive/Obstetrics                             Anesthesia Physical Anesthesia Plan  ASA: III  Anesthesia Plan: General   Post-op Pain Management:    Induction: Intravenous, Rapid sequence and Cricoid pressure planned  Airway Management Planned: Oral ETT  Additional Equipment:   Intra-op Plan:   Post-operative Plan: Extubation in OR  Informed Consent: I have reviewed the patients History and Physical, chart, labs and discussed the procedure including the risks, benefits and alternatives for the proposed anesthesia with the patient or authorized representative who has indicated his/her understanding and acceptance.   Dental advisory given  Plan Discussed with: CRNA  Anesthesia Plan Comments:         Anesthesia Quick Evaluation

## 2016-04-07 NOTE — Op Note (Signed)
Patient:  Jared Bridges  DOB:  01-25-1930  MRN:  [id number]   Preop Diagnosis:  Acute cholecystitis  Postop Diagnosis:  Gangrenous cholecystitis with cholelithiasis  Procedure:  Open cholecystectomy  Surgeon:  Aviva Signs, M.D.  Anes:  Gen. endotracheal  Indications:  Patient is an 82 year old white male who presented emergency room with worsening right upper quadrant abdominal pain. He was admitted with acute cholecystitis. He had been on Xeralto, so his surgery had to be delayed 48 hours. He had had previous surgery and this precluded along with his potential for having gangrenous cholecystitis, a laparoscopic approach. The risks and benefits of the procedure including bleeding, infection, cardiopulmonary difficulties, and the possibility of hepatobiliary injury were fully explained to the patient, who gave informed consent.  Procedure note:  The patient was placed the supine position. After induction of general endotracheal anesthesia, the abdomen was prepped and draped using usual sterile technique with ChloraPrep. Surgical site confirmation was performed.  A right subcostal incision was made down to the peritoneal cavity. The peritoneal cavity was entered into without difficulty. The gallbladder was noted to be significantly inflamed and tense with a thickened gallbladder wall. Portions of the gallbladder wall were necrotic and it was entered into in order to decompress the lumen and remove the stones. Necrotic debris and clots of blood were noted within the lumen of the gallbladder. While removing the gallbladder using both blunt and sharp dissection, the gallbladder did somewhat fragment. I was able to identify the cystic duct and this was ligated and divided using clips. While completing the removal, the patient was noted to have significant necrosis around the cystic duct and hepatic artery. Despite placing clips on the cystic artery, the tissue was so necrotic that the hole in the side  wall of the hepatic artery started to bleed. This required multiple 3-0 silk figure-of-eight sutures to occlude. Again it was noted that there was a significant amount necrotic tissue that was in this area. Ultimately, the hole in the hepatic duct was ligated. The wound was copiously irrigated normal saline. The common bile duct was inspected and noted to be without injury. Fibrin gel and Gelfoam were placed over the repair of the hepatic artery. Pressure was held in this region. On final inspection, no bleeding was noted from this region. Surgicel was placed in the gallbladder bed. All tapes were then removed. A #10 flat Jackson-Pratt drain was placed into the gallbladder fossa and brought out through separate stab wound inferior to the incision line. It was secured at the skin level using 3-0 nylon suture. The posterior abdominal wall was reapproximated using an 0 Vicryl running suture. The anterior abdominal fascia was reapproximated using 0 interrupted Vicryl sutures. Subcutaneous layer was irrigated normal saline. 0.5% Sensorcaine was instilled into the surrounding wound. Skin staples were then applied. A dry sterile dressing was then applied.  All tape and needle counts were correct the end of the procedure. The patient was extubated in the operating room and transferred to PACU in guarded but stable condition.    Complications:  Bleeding from the hepatic artery  EBL:  1500 mL  Specimen:  Gallbladder  Transfusions: 3 units packed red blood cells  Drains: JP drain to subhepatic space

## 2016-04-07 NOTE — Anesthesia Procedure Notes (Signed)
Procedure Name: Intubation Date/Time: 04/07/2016 11:20 AM Performed by: Vista Deck Pre-anesthesia Checklist: Patient identified, Patient being monitored, Timeout performed, Emergency Drugs available and Suction available Patient Re-evaluated:Patient Re-evaluated prior to inductionOxygen Delivery Method: Circle System Utilized Preoxygenation: Pre-oxygenation with 100% oxygen Intubation Type: IV induction, Rapid sequence and Cricoid Pressure applied Laryngoscope Size: Miller and 2 Grade View: Grade I Tube type: Oral Tube size: 7.0 mm Number of attempts: 1 Airway Equipment and Method: Stylet and Oral airway Placement Confirmation: ETT inserted through vocal cords under direct vision,  positive ETCO2 and breath sounds checked- equal and bilateral Secured at: 23 cm Dental Injury: Teeth and Oropharynx as per pre-operative assessment

## 2016-04-08 ENCOUNTER — Encounter (HOSPITAL_COMMUNITY): Payer: Self-pay | Admitting: General Surgery

## 2016-04-08 DIAGNOSIS — R031 Nonspecific low blood-pressure reading: Secondary | ICD-10-CM

## 2016-04-08 DIAGNOSIS — D62 Acute posthemorrhagic anemia: Secondary | ICD-10-CM

## 2016-04-08 LAB — HEPATIC FUNCTION PANEL
ALBUMIN: 2 g/dL — AB (ref 3.5–5.0)
ALT: 322 U/L — ABNORMAL HIGH (ref 17–63)
AST: 319 U/L — AB (ref 15–41)
Alkaline Phosphatase: 97 U/L (ref 38–126)
Bilirubin, Direct: 1.2 mg/dL — ABNORMAL HIGH (ref 0.1–0.5)
Indirect Bilirubin: 1.2 mg/dL — ABNORMAL HIGH (ref 0.3–0.9)
TOTAL PROTEIN: 4.8 g/dL — AB (ref 6.5–8.1)
Total Bilirubin: 2.4 mg/dL — ABNORMAL HIGH (ref 0.3–1.2)

## 2016-04-08 LAB — GLUCOSE, CAPILLARY
GLUCOSE-CAPILLARY: 186 mg/dL — AB (ref 65–99)
Glucose-Capillary: 167 mg/dL — ABNORMAL HIGH (ref 65–99)
Glucose-Capillary: 212 mg/dL — ABNORMAL HIGH (ref 65–99)
Glucose-Capillary: 191 mg/dL — ABNORMAL HIGH (ref 65–99)

## 2016-04-08 LAB — CBC
HCT: 27.8 % — ABNORMAL LOW (ref 39.0–52.0)
Hemoglobin: 9.6 g/dL — ABNORMAL LOW (ref 13.0–17.0)
MCH: 31.5 pg (ref 26.0–34.0)
MCHC: 34.5 g/dL (ref 30.0–36.0)
MCV: 91.1 fL (ref 78.0–100.0)
PLATELETS: 194 10*3/uL (ref 150–400)
RBC: 3.05 MIL/uL — ABNORMAL LOW (ref 4.22–5.81)
RDW: 15.3 % (ref 11.5–15.5)
WBC: 18.7 10*3/uL — AB (ref 4.0–10.5)

## 2016-04-08 LAB — BASIC METABOLIC PANEL
Anion gap: 6 (ref 5–15)
BUN: 31 mg/dL — ABNORMAL HIGH (ref 6–20)
CALCIUM: 7.6 mg/dL — AB (ref 8.9–10.3)
CHLORIDE: 104 mmol/L (ref 101–111)
CO2: 26 mmol/L (ref 22–32)
CREATININE: 1.17 mg/dL (ref 0.61–1.24)
GFR calc non Af Amer: 56 mL/min — ABNORMAL LOW (ref >60)
Glucose, Bld: 174 mg/dL — ABNORMAL HIGH (ref 65–99)
Potassium: 4.1 mmol/L (ref 3.5–5.1)
SODIUM: 136 mmol/L (ref 135–145)

## 2016-04-08 LAB — PREPARE RBC (CROSSMATCH)

## 2016-04-08 LAB — PHOSPHORUS: PHOSPHORUS: 3 mg/dL (ref 2.5–4.6)

## 2016-04-08 LAB — MAGNESIUM: Magnesium: 1.6 mg/dL — ABNORMAL LOW (ref 1.7–2.4)

## 2016-04-08 MED ORDER — HYDROCORTISONE NA SUCCINATE PF 100 MG IJ SOLR
25.0000 mg | Freq: Three times a day (TID) | INTRAMUSCULAR | Status: DC
  Administered 2016-04-08 – 2016-04-09 (×3): 25 mg via INTRAVENOUS
  Filled 2016-04-08 (×3): qty 2

## 2016-04-08 MED ORDER — FAMOTIDINE 20 MG PO TABS
20.0000 mg | ORAL_TABLET | Freq: Two times a day (BID) | ORAL | Status: DC
  Administered 2016-04-08 – 2016-04-11 (×7): 20 mg via ORAL
  Filled 2016-04-08 (×7): qty 1

## 2016-04-08 MED ORDER — MAGNESIUM SULFATE 2 GM/50ML IV SOLN
2.0000 g | Freq: Once | INTRAVENOUS | Status: AC
  Administered 2016-04-08: 2 g via INTRAVENOUS
  Filled 2016-04-08: qty 50

## 2016-04-08 MED ORDER — OXYCODONE HCL 5 MG PO TABS
5.0000 mg | ORAL_TABLET | ORAL | Status: DC | PRN
  Administered 2016-04-08 – 2016-04-09 (×2): 5 mg via ORAL
  Filled 2016-04-08 (×2): qty 1

## 2016-04-08 MED ORDER — MAGNESIUM HYDROXIDE 400 MG/5ML PO SUSP
30.0000 mL | Freq: Two times a day (BID) | ORAL | Status: DC
Start: 2016-04-08 — End: 2016-04-11
  Administered 2016-04-08 – 2016-04-11 (×5): 30 mL via ORAL
  Filled 2016-04-08 (×7): qty 30

## 2016-04-08 NOTE — Progress Notes (Signed)
ANTIBIOTIC CONSULT NOTE  Pharmacy Consult for Jared Bridges Indication: Intra Abdominal Infection  Allergies  Allergen Reactions  . Jared Bridges [Verapamil] Other (See Comments)    Weakness  . Jared Bridges [Jared Bridges] Other (See Comments)    Makes patient have jerking motions.  . Jared Bridges Hives and Itching    Can pre-med with benadryl  . Jared Bridges Hives and Itching    very allergic(per daughter), can pre-med with benadryl  . Latex Other (See Comments)    Redness iritation  . Lisinopril Other (See Comments)    Makes patient have jerking motions.  . Jared Bridges [Jared Bridges] Other (See Comments)    Makes patient have jerking motions.  Jared Bridges Kitchen Jared Bridges [Jared Bridges] Diarrhea and Other (See Comments)    Makes patient have jerking motions.  . Jared Bridges [Jared Bridges] Other (See Comments)    "Did not work"  . Jared Bridges [Jared Bridges] Other (See Comments)    "Did not work"  . Jared Bridges [Jared Bridges] Other (See Comments)    Dizziness  . Jared Bridges Other (See Comments)    "Did not work"  . Jared Bridges Nausea Only  . Tape Other (See Comments)    Skin irritation    Patient Measurements: Height: 6\' 2"  (188 cm) Weight: 215 lb 9.8 oz (97.8 kg) IBW/kg (Calculated) : 82.2 Adjusted Body Weight:   Vital Signs: Temp: 98.7 F (37.1 C) (05/09 0800) Temp Source: Oral (05/09 0800) BP: 151/55 mmHg (05/09 1000) Pulse Rate: 75 (05/09 1000) Intake/Output from previous day: 05/08 0701 - 05/09 0700 In: 4282.1 [P.O.:480; I.V.:2727.1; Blood:1035] Out: 2630 [Urine:1000; Drains:130; Blood:1500] Intake/Output from this shift: Total I/O In: 1812.5 [I.V.:1762.5; IV Piggyback:50] Out: 370 [Urine:350; Drains:20]  Labs:  Recent Labs  04/07/16 0509 04/07/16 1347 04/08/16 0412  WBC 20.0* 20.4* 18.7*  HGB 10.3* 11.7* 9.6*  PLT 209 198 194  CREATININE 0.98 1.04 1.17   Estimated Creatinine Clearance: 55.6 mL/min (by C-G formula based on Cr of 1.17). No results for input(s): VANCOTROUGH, VANCOPEAK,  VANCORANDOM, GENTTROUGH, GENTPEAK, GENTRANDOM, TOBRATROUGH, TOBRAPEAK, TOBRARND, AMIKACINPEAK, AMIKACINTROU, AMIKACIN in the last 72 hours.   Microbiology: Recent Results (from the past 720 hour(s))  Urine culture     Status: Abnormal   Collection Time: 04/04/16 11:47 PM  Result Value Ref Range Status   Specimen Description URINE, CLEAN CATCH  Final   Special Requests NONE  Final   Culture (A)  Final    3,000 COLONIES/mL INSIGNIFICANT GROWTH Performed at [facility]    Report Status 04/07/2016 FINAL  Final  Surgical pcr screen     Status: None   Collection Time: 04/06/16 10:46 AM  Result Value Ref Range Status   MRSA, PCR NEGATIVE NEGATIVE Final   Staphylococcus aureus NEGATIVE NEGATIVE Final    Comment:        The Xpert SA Assay (FDA approved for NASAL specimens in patients over 14 years of age), is one component of a comprehensive surveillance program.  Test performance has been validated by [facility] for patients greater than or equal to 86 year old. It is not intended to diagnose infection nor to guide or monitor treatment.     Medical History: Past Medical History  Diagnosis Date  . Diverticulitis   . Syncope   . Hypercholesteremia   . GI bleed     Dr. Laural Golden - 1998  . Peptic ulcer disease   . Anemia, iron deficiency   . Depression   . Peripheral neuropathy ([place])   . Polymyalgia rheumatica ([place])   . Essential hypertension  UNDER CONTROL   . Heart murmur   . Myocardial infarction [facility])     ???? IN COLLEGE   NOTHING FOUND ON CATH (Q WAVE SHOWS)  . Stroke ([place])     11/2015      TROUBLE READING+ WRITING   . Thyroid nodule   . Type 2 diabetes mellitus ([name]     DIET CONTROLLED   . Chronic kidney disease     RESOLVED  . Chronic headaches     HX  MIGRAINES     Medications:  Scheduled:  . divalproex  250 mg Oral q morning - 10a  . famotidine  20 mg Oral BID  . hydrocortisone sod succinate (SOLU-CORTEF) inj  25 mg Intravenous Q8H  . insulin  aspart  0-9 Units Subcutaneous TID WC  . magnesium hydroxide  30 mL Oral BID  . piperacillin-tazobactam (Jared Bridges)  IV  3.375 g Intravenous Q8H  . pregabalin  25 mg Oral QHS  . venlafaxine XR  75 mg Oral Daily   Assessment: 82 yo male admitted right upper quadrant pain s/p open cholecystectomy.  He is on day #4 Jared Bridges.   Goal of Therapy:  Eradicate infection  Plan:  Jared Bridges 3.375 GM IV every 8 hours (4 hour infusion) LOT antibiotics? F/U labs, cultures  Jared Bridges 04/08/2016,11:01 AM

## 2016-04-08 NOTE — Plan of Care (Signed)
Problem: Activity: Goal: Risk for activity intolerance will decrease Outcome: Progressing Patient up in the chair patient today tolerated well.

## 2016-04-08 NOTE — Evaluation (Signed)
Physical Therapy Evaluation Patient Details Name: Jared Bridges MRN: [id number] DOB: 08-20-1930 Today's Date: 04/08/2016   History of Present Illness  82 yo M admitted with abdominal pain.  CT of the abdomen (+) for cholecystitis.  Pt is now s/p open cholecystectomy on 04/07/2016 with acute blood loss requiring 3 units of PRBC's. H&H today: 9.6/27.8.  Extensive PMH: L TKA with infection and I&D, then revision on 02/28/2016.  MI, DM2, spinal fusion, R femur surgery, R shoulder surgery, HTN, CVA, Diverticulitis s/p colectomy, PUD, AAA, GI bleed, peripheral neuropathy, polymyalgia rheumatica. L knee meniscus repair, hernia repair.   Clinical Impression  Pt received in bed, and was agreeable to PT evaluation.  Pt states that he normally lives at home with his wife and dtr.  He occasionally uses a cane for ambulation, and was independent with dressing, bathing, driving.  Pt required Min guard for transfer supine<>sit, and sit<>stand with RW, however further mobility limited due to orthostatic hypotension with readings as follows:  Supine: 133/64, HR: 75bpm Sitting: 114/57, HR: 74bpm Standing: 89/49, HR: 61bpm Recovery back in supine position: 147/68, HR: 87bpm Pt is very determined and is recommend HHPT at d/c.     Follow Up Recommendations Home health PT    Equipment Recommendations       Recommendations for Other Services       Precautions / Restrictions Restrictions Weight Bearing Restrictions: No      Mobility  Bed Mobility Overal bed mobility: Modified Independent Bed Mobility: Supine to Sit     Supine to sit: HOB elevated     General bed mobility comments: increased time with significant use of bed rails.   Transfers Overall transfer level: Needs assistance Equipment used: Rolling walker (2 wheeled) Transfers: Sit to/from Stand Sit to Stand: Min guard         General transfer comment: vc's for hand placement. Pt limited with mobility today due to orthostatic  hypotension.   Ambulation/Gait Ambulation/Gait assistance: Min guard Ambulation Distance (Feet): 2 Feet Assistive device: Rolling walker (2 wheeled) Gait Pattern/deviations: Step-to pattern;Trunk flexed     General Gait Details: Pt able to take a few lateral steps towards the [facility] in preparation to get positioned back in the bed.   Stairs            Wheelchair Mobility    Modified Rankin (Stroke Patients Only)       Balance Overall balance assessment: Needs assistance         Standing balance support: Bilateral upper extremity supported Standing balance-Leahy Scale: Fair Standing balance comment: Pt with orthostatic hypotension and c/o dizziness and lightheadedness.                               Pertinent Vitals/Pain Pain Assessment: 0-10 Pain Score: 7  Pain Location: Abdominal Pain Descriptors / Indicators: Other (Comment) (surgical) Pain Intervention(s): Limited activity within patient's tolerance;Repositioned    Home Living Family/patient expects to be discharged to:: Private residence Living Arrangements: Spouse/significant other;Children (Dtr lives with them) Available Help at Discharge: Family;Available 24 hours/day Type of Home: House Home Access: Stairs to enter   [employer] of Steps: 2 with no HR Home Layout: Two level Home Equipment: Walker - 2 wheels;Cane - single point;Bedside commode;Shower seat;Hand held shower head      Prior Function Level of Independence: Independent   Gait / Transfers Assistance Needed: Pt states that he uses the cane on occasion - depending on  how he is feeling.            Hand Dominance   Dominant Hand: Right    Extremity/Trunk Assessment   Upper Extremity Assessment: Overall WFL for tasks assessed           Lower Extremity Assessment: Overall WFL for tasks assessed         Communication   Communication: HOH  Cognition Arousal/Alertness: Awake/alert Behavior During Therapy:  WFL for tasks assessed/performed Overall Cognitive Status: Within Functional Limits for tasks assessed                      General Comments      Exercises        Assessment/Plan    PT Assessment Patient needs continued PT services  PT Diagnosis Difficulty walking;Abnormality of gait   PT Problem List Decreased strength;Decreased range of motion;Decreased activity tolerance;Decreased balance;Decreased mobility;Decreased safety awareness;Decreased knowledge of precautions;Cardiopulmonary status limiting activity;Pain  PT Treatment Interventions Gait training;Stair training;DME instruction;Functional mobility training;Therapeutic activities;Therapeutic exercise;Balance training;Patient/family education   PT Goals (Current goals can be found in the Care Plan section) Acute Rehab PT Goals Patient Stated Goal: Pt wants to get back home, and get stronger.  PT Goal Formulation: With patient Time For Goal Achievement: 04/15/16 Potential to Achieve Goals: Good    Frequency Min 5X/week   Barriers to discharge        Co-evaluation               End of Session Equipment Utilized During Treatment: Gait belt Activity Tolerance: Patient tolerated treatment well Patient left: in bed;with call bell/phone within reach Nurse Communication: Mobility status    Functional Assessment Tool Used: [employer] "6-click" Functional Limitation: Mobility: Walking and moving around Mobility: Walking and Moving Around Current Status [phone]): At least 40 percent but less than 60 percent impaired, limited or restricted Mobility: Walking and Moving Around Goal Status [phone]): At least 20 percent but less than 40 percent impaired, limited or restricted    Time: [id number] PT Time Calculation (min) (ACUTE ONLY): 30 min   Charges:   PT Evaluation $PT Eval High Complexity: 1 Procedure PT Treatments $Therapeutic Activity: 8-22 mins   PT G Codes:   PT G-Codes **NOT FOR  INPATIENT CLASS** Functional Assessment Tool Used: [employer] "6-click" Functional Limitation: Mobility: Walking and moving around Mobility: Walking and Moving Around Current Status [phone]): At least 40 percent but less than 60 percent impaired, limited or restricted Mobility: Walking and Moving Around Goal Status [phone]): At least 20 percent but less than 40 percent impaired, limited or restricted    Eustaquio Maize Yobani Schertzer, PT, DPT X: [id number]   04/08/2016, 11:49 AM

## 2016-04-08 NOTE — Care Management Note (Signed)
Case Management Note  Patient Details  Name: Jared Bridges MRN: [id number] Date of Birth: 1930-06-14  Subjective/Objective:                  Pt is from home, live with his wife and daughter and is ind with ADL's. Pt has cane, walker, BSC if needed at home. Pt has no WC. Pt has used [facility] int he past for [facility] services and would like to use them again. PT has recommended HH PT. Pt is agreeable. Romualdo Bolk, of [facility], made aware of referral and will obtain pt info from chart. No DME needs at this time.   Action/Plan: Will cont to follow for DC planning.   Expected Discharge Date:    04/12/2016              Expected Discharge Plan:  [place]  In-House Referral:  NA  Discharge planning Services  CM Consult  Post Acute Care Choice:  Home Health Choice offered to:  Patient  DME Arranged:    DME Agency:     HH Arranged:  PT, RN [place] Agency:  [facility]  Status of Service:  In process, will continue to follow  Medicare Important Message Given:  Yes Date Medicare IM Given:    Medicare IM give by:    Date Additional Medicare IM Given:    Additional Medicare Important Message give by:     If discussed at [place] of Stay Meetings, dates discussed:    Additional Comments:  Sherald Barge, RN 04/08/2016, 3:15 PM

## 2016-04-08 NOTE — Care Management Important Message (Signed)
Important Message  Patient Details  Name: DEMARIAE PANAGOPOULOS MRN: [id number] Date of Birth: 1930/03/20   Medicare Important Message Given:  Yes    Sherald Barge, RN 04/08/2016, 11:52 AM

## 2016-04-08 NOTE — Progress Notes (Signed)
1 Day Post-Op  Subjective: Sitting at bedside. He is comfortable, but does have moderate incisional pain with deep breaths. No nausea noted.  Objective: Vital signs in last 24 hours: Temp:  [97.1 F (36.2 C)-99 F (37.2 C)] 98.3 F (36.8 C) (05/09 0400) Pulse Rate:  [65-91] 82 (05/09 0700) Resp:  [20-35] 24 (05/09 0700) BP: (81-159)/(53-74) 99/74 mmHg (05/09 0700) SpO2:  [94 %-100 %] 96 % (05/09 0700) Weight:  [97.8 kg (215 lb 9.8 oz)] 97.8 kg (215 lb 9.8 oz) (05/09 0500) Last BM Date: 04/05/16  Intake/Output from previous day: 05/08 0701 - 05/09 0700 In: 4282.1 [P.O.:480; I.V.:2727.1; Blood:1035] Out: 2630 [Urine:1000; Drains:130; Blood:1500] Intake/Output this shift: Total I/O In: -  Out: 20 [Drains:20]  General appearance: alert, cooperative and no distress Eyes: No scleral icterus noted. Resp: clear to auscultation bilaterally Cardio: regular rate and rhythm and systolic murmur: early systolic 2/6, Soft at 2nd left intercostal space GI: Soft. Dressing dry and intact. Nondistended. JP drainage with minimal serosanguineous drainage present.  Lab Results:   Recent Labs  04/07/16 1347 04/08/16 0412  WBC 20.4* 18.7*  HGB 11.7* 9.6*  HCT 34.8* 27.8*  PLT 198 194   BMET  Recent Labs  04/07/16 1347 04/08/16 0412  NA 134* 136  K 4.1 4.1  CL 103 104  CO2 25 26  GLUCOSE 184* 174*  BUN 24* 31*  CREATININE 1.04 1.17  CALCIUM 7.3* 7.6*   PT/INR No results for input(s): LABPROT, INR in the last 72 hours.  Studies/Results: Dg Chest Port 1 View  04/07/2016  CLINICAL DATA:  Shortness of Breath EXAM: PORTABLE CHEST 1 VIEW COMPARISON:  January 07, 2016 FINDINGS: There is no edema or consolidation. Heart is upper normal in size with pulmonary vascularity within normal limits. There is epicardial fat prominence at the right heart border. No adenopathy. There is chronic acromioclavicular separation on the right. IMPRESSION: No edema or consolidation. Stable cardiac  silhouette. Chronic acromioclavicular separation on the right. Electronically Signed   By: Lowella Grip III M.D.   On: 04/07/2016 14:29    Anti-infectives: Anti-infectives    Start     Dose/Rate Route Frequency Ordered Stop   04/05/16 2300  vancomycin (VANCOCIN) 1,250 mg in sodium chloride 0.9 % 250 mL IVPB  Status:  Discontinued     1,250 mg 166.7 mL/hr over 90 Minutes Intravenous Every 12 hours 04/05/16 0954 04/08/16 0739   04/05/16 1200  piperacillin-tazobactam (ZOSYN) IVPB 3.375 g     3.375 g 12.5 mL/hr over 240 Minutes Intravenous Every 8 hours 04/05/16 0730     04/05/16 1100  vancomycin (VANCOCIN) 1,500 mg in sodium chloride 0.9 % 500 mL IVPB     1,500 mg 250 mL/hr over 120 Minutes Intravenous  Once 04/05/16 0952 04/05/16 1251   04/05/16 0345  piperacillin-tazobactam (ZOSYN) IVPB 3.375 g     3.375 g 12.5 mL/hr over 240 Minutes Intravenous  Once 04/05/16 0340 04/05/16 0800      Assessment/Plan: s/p Procedure(s): CHOLECYSTECTOMY Impression: Stable on postoperative day 1. Anemia secondary to acute surgical blood loss. Vital signs are stable. Hypomagnesemia present, will be supplemented. We'll advance to full liquid diet. Liver enzyme tests improving. Continue monitoring in ICU. Agree with removing Foley.  LOS: 3 days    Jared Bridges A 04/08/2016

## 2016-04-08 NOTE — Progress Notes (Signed)
PROGRESS NOTE        PATIENT DETAILS Name: CASHAWN KUCERA Age: 82 y.o. Sex: male Date of Birth: 07/18/1930 Admit Date: 04/04/2016 Admitting Physician Oswald Hillock, MD [id number] C, MD Outpatient Specialists: Dr. Domenic Polite  Brief Narrative: Patient is a 82 y.o. male prior CVA without any focal deficits, chronic migraine headaches, dyslipidemia, recent left knee replacement-on prophylactic Xarelto (last dose 5/5 am)- who presented with right upper quadrant abdominal pain. CT of the abdomen and ultrasound of the gallbladder suspicious for acute cholecystitis. Underwent Open cholecystectomy on 5/8. See below for further details  Subjective: Awake,alert and sitting at bedside  Assessment/Plan: Active Problems: Acute cholecystitis: improving, s/p Open cholecystectomy on 5/8. Intraoperative course complicated by acute blood loss from bleeding hepatic artery, requiring 3 units of PRBC in the operating room. Leukocytosis downtrending, LFTs still elevated-but suspect will start improving soon. Will discontinue vancomycin today, and continue with Zosyn for now-suspect all antibiotics could be discontinued soon. Mobilize with physical therapy, discontinue Foley catheter-if okay with general surgery-transfer to MedSurg unit.  Anemia: Likely secondary to intraoperative acute blood loss-from bleeding hepatic artery, required 3 units of PRBC transfusion. Hemoglobin slightly decreased than usual baseline but relatively stable. Continue to follow.  Transient hypotension: Resolved. Occurred postoperatively-likely due to acute blood loss. Required 3 units of PRBC intraoperatively. Although pressors ordered-never started, stress dosed with steroids-as on tapering steroids as an outpatient for polymyalgia. Start tapering Solu-Cortef.  Recent left total knee revision: was on prophylactic Xarelto prior to this admission-last dose on 5/5 AM. Xarelto currently on hold, resume once okay  with general surgery.Left knee-with no obvious infection-however has skin discoloration.   History of CVA: Nonfocal exam-continue statin   History of aortic stenosis: Systolic murmur [id number] echocardiogram January 2017 shows mild aortic stenosis. Tolerated recent left knee revision arthroplasty major complications, and now has tolerated open cholecystectomy pretty well.   History of migraine headaches: Currently headache free-continue Depakote and Lyrica  Hypertension:Not on any antihypertensives-blood pressure well controlled.  History of diabetes: Not on oral hypoglycemic agents at home-CBGs stable with SSI. Follow.  History of peptic ulcer disease: continue Pepcid  History of polymyalgia rheumatica: On prednisone twice a week as an outpatient-prior to this, patient was on daily steroids approximately a month back. Since developed acute blood loss hypertension, was stress dosed steroids postoperatively. Will start tapering Solu-Cortef.  DVT Prophylaxis: SCD's  Code Status: Full code   Family Communication: None at bedside   Disposition Plan: Remain inpatient-but will plan on Home health vs SNF on discharge-await PT evaluation  Antimicrobial agents: IV Zosyn 5/6>> IV vancomycin 5/6>>5/9  Procedures: None  CONSULTS:  general surgery  Time spent: 25 minutes-Greater than 50% of this time was spent in counseling, explanation of diagnosis, planning of further management, and coordination of care.  MEDICATIONS: Anti-infectives    Start     Dose/Rate Route Frequency Ordered Stop   04/05/16 2300  vancomycin (VANCOCIN) 1,250 mg in sodium chloride 0.9 % 250 mL IVPB  Status:  Discontinued     1,250 mg 166.7 mL/hr over 90 Minutes Intravenous Every 12 hours 04/05/16 0954 04/08/16 0739   04/05/16 1200  piperacillin-tazobactam (ZOSYN) IVPB 3.375 g     3.375 g 12.5 mL/hr over 240 Minutes Intravenous Every 8 hours 04/05/16 0730     04/05/16 1100  vancomycin (VANCOCIN) 1,500 mg in  sodium chloride 0.9 %  500 mL IVPB     1,500 mg 250 mL/hr over 120 Minutes Intravenous  Once 04/05/16 0952 04/05/16 1251   04/05/16 0345  piperacillin-tazobactam (ZOSYN) IVPB 3.375 g     3.375 g 12.5 mL/hr over 240 Minutes Intravenous  Once 04/05/16 0340 04/05/16 0800      Scheduled Meds: . divalproex  250 mg Oral q morning - 10a  . famotidine (PEPCID) IV  20 mg Intravenous Q24H  . hydrocortisone sod succinate (SOLU-CORTEF) inj  25 mg Intravenous Q8H  . insulin aspart  0-9 Units Subcutaneous TID WC  . piperacillin-tazobactam (ZOSYN)  IV  3.375 g Intravenous Q8H  . pregabalin  25 mg Oral QHS  . venlafaxine XR  75 mg Oral Daily   Continuous Infusions: . lactated ringers 125 mL/hr at 04/08/16 0534   PRN Meds:.acetaminophen **OR** acetaminophen, clonazePAM, morphine injection, ondansetron **OR** ondansetron (ZOFRAN) IV, simethicone   PHYSICAL EXAM: Vital signs: Filed Vitals:   04/08/16 0400 04/08/16 0500 04/08/16 0600 04/08/16 0700  BP: 138/61 140/56 136/62 99/74  Pulse: 72 74 76 82  Temp: 98.3 F (36.8 C)     TempSrc: Oral     Resp: 21 20 21 24   Height:      Weight:  97.8 kg (215 lb 9.8 oz)    SpO2: 97% 96% 96% 96%   Filed Weights   04/04/16 2331 04/05/16 0444 04/08/16 0500  Weight: 97.07 kg (214 lb) 92.171 kg (203 lb 3.2 oz) 97.8 kg (215 lb 9.8 oz)   Body mass index is 27.67 kg/(m^2).   Gen Exam: Awake and alert with clear speech. Not in any distress-sitting up at bedside  Neck: Supple, No JVD.   Chest: B/L Clear.   CVS: S1 S2 Regular, 3/6 systolic murmur.  Abdomen: soft, BS sluggish, appropriately tender at the operative site-drain in place.  Extremities: no edema, lower extremities warm to touch. Neurologic: Non Focal.   Skin: No Rash or lesions   Wounds: N/A.    LABORATORY DATA: CBC:  Recent Labs Lab 04/05/16 0001 04/05/16 0548 04/06/16 0542 04/07/16 0509 04/07/16 1347 04/08/16 0412  WBC 12.7* 25.4* 27.9* 20.0* 20.4* 18.7*  NEUTROABS 11.0*  --   --    --   --   --   HGB 11.6* 11.6* 10.9* 10.3* 11.7* 9.6*  HCT 35.4* 36.1* 33.3* 31.4* 34.8* 27.8*  MCV 95.9 96.0 97.9 96.6 93.3 91.1  PLT 227 233 216 209 198 [id number]    Basic Metabolic Panel:  Recent Labs Lab 04/05/16 0548 04/06/16 0542 04/07/16 0509 04/07/16 1347 04/08/16 0412  NA 138 135 136 134* 136  K 3.5 3.6 3.5 4.1 4.1  CL 101 99* 102 103 104  CO2 27 27 28 25 26   GLUCOSE 185* 160* 133* 184* 174*  BUN 17 21* 23* 24* 31*  CREATININE 0.95 1.01 0.98 1.04 1.17  CALCIUM 8.4* 8.0* 8.1* 7.3* 7.6*  MG  --   --   --   --  1.6*  PHOS  --   --   --   --  3.0    GFR: Estimated Creatinine Clearance: 55.6 mL/min (by C-G formula based on Cr of 1.17).  Liver Function Tests:  Recent Labs Lab 04/05/16 0001 04/05/16 0548 04/06/16 0542 04/07/16 0509 04/08/16 0412  AST 22 24 25  225* 319*  ALT 16* 19 23 208* 322*  ALKPHOS 74 67 57 109 97  BILITOT 0.8 1.0 1.3* 4.2* 2.4*  PROT 6.6 6.6 6.1* 5.8* 4.8*  ALBUMIN 3.5 3.4* 2.9* 2.6*  2.0*    Recent Labs Lab 04/05/16 0001  LIPASE 14   No results for input(s): AMMONIA in the last 168 hours.  Coagulation Profile:  Recent Labs Lab 04/05/16 0548  INR 1.29    Cardiac Enzymes: No results for input(s): CKTOTAL, CKMB, CKMBINDEX, TROPONINI in the last 168 hours.  BNP (last 3 results) No results for input(s): PROBNP in the last 8760 hours.  HbA1C: No results for input(s): HGBA1C in the last 72 hours.  CBG:  Recent Labs Lab 04/07/16 0743 04/07/16 1047 04/07/16 1411 04/07/16 1710 04/07/16 2057  GLUCAP 133* 147* 170* 186* 194*    Lipid Profile: No results for input(s): CHOL, HDL, LDLCALC, TRIG, CHOLHDL, LDLDIRECT in the last 72 hours.  Thyroid Function Tests: No results for input(s): TSH, T4TOTAL, FREET4, T3FREE, THYROIDAB in the last 72 hours.  Anemia Panel: No results for input(s): VITAMINB12, FOLATE, FERRITIN, TIBC, IRON, RETICCTPCT in the last 72 hours.  Urine analysis:    Component Value Date/Time   COLORURINE  YELLOW 04/04/2016 2347   APPEARANCEUR CLEAR 04/04/2016 2347   LABSPEC 1.015 04/04/2016 2347   PHURINE 8.0 04/04/2016 2347   GLUCOSEU NEGATIVE 04/04/2016 2347   HGBUR SMALL* 04/04/2016 2347   BILIRUBINUR NEGATIVE 04/04/2016 2347   KETONESUR 15* 04/04/2016 2347   PROTEINUR TRACE* 04/04/2016 2347   UROBILINOGEN 1.0 05/21/2015 1050   NITRITE NEGATIVE 04/04/2016 2347   LEUKOCYTESUR NEGATIVE 04/04/2016 2347    Sepsis Labs: Lactic Acid, Venous    Component Value Date/Time   LATICACIDVEN 1.9 04/05/2016 0001    MICROBIOLOGY: Recent Results (from the past 240 hour(s))  Urine culture     Status: Abnormal   Collection Time: 04/04/16 11:47 PM  Result Value Ref Range Status   Specimen Description URINE, CLEAN CATCH  Final   Special Requests NONE  Final   Culture (A)  Final    3,000 COLONIES/mL INSIGNIFICANT GROWTH Performed at [facility]    Report Status 04/07/2016 FINAL  Final  Surgical pcr screen     Status: None   Collection Time: 04/06/16 10:46 AM  Result Value Ref Range Status   MRSA, PCR NEGATIVE NEGATIVE Final   Staphylococcus aureus NEGATIVE NEGATIVE Final    Comment:        The Xpert SA Assay (FDA approved for NASAL specimens in patients over 52 years of age), is one component of a comprehensive surveillance program.  Test performance has been validated by [facility] for patients greater than or equal to 13 year old. It is not intended to diagnose infection nor to guide or monitor treatment.     RADIOLOGY STUDIES/RESULTS: Ct Abdomen Pelvis Wo Contrast  04/05/2016  CLINICAL DATA:  Sudden onset generalized abdominal pain for 8 hours. EXAM: CT ABDOMEN AND PELVIS WITHOUT CONTRAST TECHNIQUE: Multidetector CT imaging of the abdomen and pelvis was performed following the standard protocol without IV contrast. COMPARISON:  01/13/2013 FINDINGS: The gallbladder is distended with sludge or noncalcified calculi. There is inflammatory stranding adjacent to the  gallbladder and along the undersurface of the liver. There is no bile duct dilatation. No focal liver lesions are evident on this unenhanced scan. The pancreas is unremarkable.  No pancreatic duct dilatation. The spleen, adrenals and kidneys exhibit unremarkable unenhanced appearances. Ureters and urinary bladder are unremarkable. There are normal appearances of the stomach, small bowel and colon. The appendix is normal. The abdominal aorta has a mild infrarenal fusiform dilatation but maximum AP diameter is only 2.9 cm. It resumes normal caliber at the bifurcation.  There is no extraluminal air.  There is no ascites. There is no significant skeletal lesion. There is prior instrumented fusion at L4-5 with pedicle screw fixation and interbody fusion cage. There is no significant abnormality in the lower chest. IMPRESSION: Abnormal gallbladder, appearing distended and thickened with adjacent inflammatory stranding. The stranding opacities continue along the undersurface of the liver. Suspicious for cholecystitis. Consider sonography for better characterization. These results will be called to the ordering clinician or representative by the Radiologist Assistant, and communication documented in the PACS or zVision Dashboard. Electronically Signed   By: Andreas Newport M.D.   On: 04/05/2016 03:21   US Abdomen Complete  04/05/2016  CLINICAL DATA:  Right upper quadrant pain.  Cholecystitis. EXAM: ABDOMEN ULTRASOUND COMPLETE COMPARISON:  CT 04/05/2016 FINDINGS: Gallbladder: Over distended with 1 cm wall thickness that is heterogeneous and striated. There is no focal tenderness, but this could be confounded by analgesic administration. Internally there is echogenic and partially shadowing material likely calculi and hemorrhage as seen on previous CT. Cannot exclude a gallbladder mass. Common bile duct: Diameter: 3 mm were seen.  Initial encounter. Liver: No focal lesion identified. Within normal limits in parenchymal  echogenicity. IVC: No abnormality visualized. Pancreas: Essentially nonvisualized due to bowel gas. Recent visualization by CT. Spleen: Size and appearance within normal limits. Fluid-filled structure and neighboring the spleen is stomach based on comparison CT. Right Kidney: Length: 13 cm. Echogenicity within normal limits. No mass or hydronephrosis visualized. Left Kidney: Length: 12 cm. Echogenicity within normal limits. No mass or hydronephrosis visualized. Abdominal aorta: No aneurysm visualized sonographically. Ectatic distal aorta 29 mm by CT. IMPRESSION: 1. Findings consistent with acute cholecystitis. Murphy's sign is negative, but could be confounded by analgesic medication or advanced disease with insensate gallbladder. Extensive debris in the gallbladder lumen, at least partially hemorrhage based on previous CT. A gallbladder mass cannot be excluded. 2. No bile duct dilatation. Electronically Signed   By: Monte Fantasia M.D.   On: 04/05/2016 09:37   Dg Chest Port 1 View  04/07/2016  CLINICAL DATA:  Shortness of Breath EXAM: PORTABLE CHEST 1 VIEW COMPARISON:  January 07, 2016 FINDINGS: There is no edema or consolidation. Heart is upper normal in size with pulmonary vascularity within normal limits. There is epicardial fat prominence at the right heart border. No adenopathy. There is chronic acromioclavicular separation on the right. IMPRESSION: No edema or consolidation. Stable cardiac silhouette. Chronic acromioclavicular separation on the right. Electronically Signed   By: Lowella Grip III M.D.   On: 04/07/2016 14:29     LOS: 3 days   Oren Binet, MD  [facility] Pager:[phone]  If 7PM-7AM, please contact night-coverage [email or web address] Password [email or web address] 04/08/2016, 7:41 AM

## 2016-04-08 NOTE — Addendum Note (Signed)
Addendum  created 04/08/16 1501 by Ollen Bowl, CRNA   Modules edited: Clinical Notes   Clinical Notes:  File: [id number]

## 2016-04-08 NOTE — Anesthesia Postprocedure Evaluation (Signed)
Anesthesia Post Note  Patient: ARUL BARTHOL  Procedure(s) Performed: Procedure(s) (LRB): CHOLECYSTECTOMY (N/A)  Patient location during evaluation: ICU Anesthesia Type: General Level of consciousness: awake and alert and oriented Pain management: pain level controlled Vital Signs Assessment: post-procedure vital signs reviewed and stable Respiratory status: spontaneous breathing Cardiovascular status: blood pressure returned to baseline Postop Assessment: no signs of nausea or vomiting Anesthetic complications: no    Last Vitals:  Filed Vitals:   04/08/16 1139 04/08/16 1200  BP: 147/68 134/56  Pulse: 87 73  Temp:  36.9 C  Resp:  19    Last Pain:  Filed Vitals:   04/08/16 1315  PainSc: 5                  Nisreen Guise

## 2016-04-08 NOTE — Progress Notes (Signed)
Several attempts made to assist patient to urinate in the urinal after foley catheter removed earlier today. Assisted patient sitting and standing to used the urinal patient not able to void. Bladder scan results 429 ml noted. MD notified new order to reinsert foley.

## 2016-04-08 NOTE — Plan of Care (Signed)
Problem: Fluid Volume: Goal: Ability to maintain a balanced intake and output will improve Outcome: Progressing Encourage patient to drink po liquids.

## 2016-04-09 DIAGNOSIS — I951 Orthostatic hypotension: Secondary | ICD-10-CM

## 2016-04-09 DIAGNOSIS — R338 Other retention of urine: Secondary | ICD-10-CM

## 2016-04-09 LAB — GLUCOSE, CAPILLARY
GLUCOSE-CAPILLARY: 157 mg/dL — AB (ref 65–99)
GLUCOSE-CAPILLARY: 179 mg/dL — AB (ref 65–99)
Glucose-Capillary: 141 mg/dL — ABNORMAL HIGH (ref 65–99)
Glucose-Capillary: 113 mg/dL — ABNORMAL HIGH (ref 65–99)

## 2016-04-09 LAB — BASIC METABOLIC PANEL
Anion gap: 6 (ref 5–15)
BUN: 27 mg/dL — AB (ref 6–20)
CHLORIDE: 101 mmol/L (ref 101–111)
CO2: 28 mmol/L (ref 22–32)
Calcium: 7.7 mg/dL — ABNORMAL LOW (ref 8.9–10.3)
Creatinine, Ser: 1.05 mg/dL (ref 0.61–1.24)
GFR calc Af Amer: >60 mL/min (ref >60)
GFR calc non Af Amer: >60 mL/min (ref >60)
Glucose, Bld: 150 mg/dL — ABNORMAL HIGH (ref 65–99)
POTASSIUM: 3.7 mmol/L (ref 3.5–5.1)
SODIUM: 135 mmol/L (ref 135–145)

## 2016-04-09 LAB — HEPATIC FUNCTION PANEL
ALBUMIN: 2 g/dL — AB (ref 3.5–5.0)
ALK PHOS: 83 U/L (ref 38–126)
ALT: 198 U/L — AB (ref 17–63)
AST: 103 U/L — AB (ref 15–41)
Bilirubin, Direct: 0.4 mg/dL (ref 0.1–0.5)
Indirect Bilirubin: 0.7 mg/dL (ref 0.3–0.9)
TOTAL PROTEIN: 4.8 g/dL — AB (ref 6.5–8.1)
Total Bilirubin: 1.1 mg/dL (ref 0.3–1.2)

## 2016-04-09 LAB — CBC
HEMATOCRIT: 22.5 % — AB (ref 39.0–52.0)
HEMATOCRIT: 29.7 % — AB (ref 39.0–52.0)
HEMOGLOBIN: 7.6 g/dL — AB (ref 13.0–17.0)
Hemoglobin: 10.2 g/dL — ABNORMAL LOW (ref 13.0–17.0)
MCH: 30.8 pg (ref 26.0–34.0)
MCH: 30.6 pg (ref 26.0–34.0)
MCHC: 33.8 g/dL (ref 30.0–36.0)
MCHC: 34.3 g/dL (ref 30.0–36.0)
MCV: 91.1 fL (ref 78.0–100.0)
MCV: 89.2 fL (ref 78.0–100.0)
PLATELETS: 181 10*3/uL (ref 150–400)
Platelets: 188 10*3/uL (ref 150–400)
RBC: 2.47 MIL/uL — AB (ref 4.22–5.81)
RBC: 3.33 MIL/uL — ABNORMAL LOW (ref 4.22–5.81)
RDW: 14.6 % (ref 11.5–15.5)
RDW: 15 % (ref 11.5–15.5)
WBC: 15.4 10*3/uL — AB (ref 4.0–10.5)
WBC: 14.4 10*3/uL — AB (ref 4.0–10.5)

## 2016-04-09 LAB — PHOSPHORUS: Phosphorus: 2.4 mg/dL — ABNORMAL LOW (ref 2.5–4.6)

## 2016-04-09 LAB — MAGNESIUM: Magnesium: 2.1 mg/dL (ref 1.7–2.4)

## 2016-04-09 MED ORDER — SODIUM CHLORIDE 0.9 % IV SOLN: Freq: Once | INTRAVENOUS | Status: AC

## 2016-04-09 MED ORDER — PREDNISONE 20 MG PO TABS
20.0000 mg | ORAL_TABLET | Freq: Two times a day (BID) | ORAL | Status: DC
  Administered 2016-04-09 – 2016-04-11 (×4): 20 mg via ORAL
  Filled 2016-04-09 (×4): qty 1

## 2016-04-09 MED ORDER — DIPHENHYDRAMINE HCL 50 MG/ML IJ SOLN
25.0000 mg | Freq: Once | INTRAMUSCULAR | Status: AC
  Administered 2016-04-09: 25 mg via INTRAVENOUS
  Filled 2016-04-09: qty 1

## 2016-04-09 MED ORDER — HYDROCORTISONE NA SUCCINATE PF 100 MG IJ SOLR: 25.0000 mg | Freq: Two times a day (BID) | INTRAMUSCULAR | Status: DC

## 2016-04-09 MED ORDER — ACETAMINOPHEN 325 MG PO TABS
650.0000 mg | ORAL_TABLET | Freq: Once | ORAL | Status: AC
  Administered 2016-04-09: 650 mg via ORAL
  Filled 2016-04-09: qty 2

## 2016-04-09 NOTE — Progress Notes (Signed)
2 Days Post-Op  Subjective: Patient comfortable.  Failed foley removal.  Is passing flatus.  No BM yet. Tolerating full liquid diet well.  Objective: Vital signs in last 24 hours: Temp:  [97.5 F (36.4 C)-98.6 F (37 C)] 98.4 F (36.9 C) (05/10 0900) Pulse Rate:  [39-87] 66 (05/10 0900) Resp:  [19-26] 22 (05/10 0900) BP: (128-153)/(50-88) 134/58 mmHg (05/10 0900) SpO2:  [92 %-98 %] 94 % (05/10 0900) Weight:  [99.6 kg (219 lb 9.3 oz)] 99.6 kg (219 lb 9.3 oz) (05/10 0500) Last BM Date: 04/04/16  Intake/Output from previous day: 05/09 0701 - 05/10 0700 In: 2642.5 [P.O.:780; I.V.:1762.5; IV Piggyback:100] Out: 1430 [Urine:1300; Drains:130] Intake/Output this shift: Total I/O In: 30 [Blood:30] Out: 20 [Drains:20]  General appearance: alert, cooperative and no distress Eyes: negative Resp: clear to auscultation bilaterally Cardio: regular rate and rhythm, S1, S2 normal, no murmur, click, rub or gallop GI: Soft. Bowel sounds active. Incision healing well. JP drainage serosanguineous in nature. No bile present.  Lab Results:   Recent Labs  04/08/16 0412 04/09/16 0432  WBC 18.7* 15.4*  HGB 9.6* 7.6*  HCT 27.8* 22.5*  PLT 194 188   BMET  Recent Labs  04/08/16 0412 04/09/16 0432  NA 136 135  K 4.1 3.7  CL 104 101  CO2 26 28  GLUCOSE 174* 150*  BUN 31* 27*  CREATININE 1.17 1.05  CALCIUM 7.6* 7.7*   PT/INR No results for input(s): LABPROT, INR in the last 72 hours.  Studies/Results: Dg Chest Port 1 View  04/07/2016  CLINICAL DATA:  Shortness of Breath EXAM: PORTABLE CHEST 1 VIEW COMPARISON:  January 07, 2016 FINDINGS: There is no edema or consolidation. Heart is upper normal in size with pulmonary vascularity within normal limits. There is epicardial fat prominence at the right heart border. No adenopathy. There is chronic acromioclavicular separation on the right. IMPRESSION: No edema or consolidation. Stable cardiac silhouette. Chronic acromioclavicular separation  on the right. Electronically Signed   By: Lowella Grip III M.D.   On: 04/07/2016 14:29    Anti-infectives: Anti-infectives    Start     Dose/Rate Route Frequency Ordered Stop   04/05/16 2300  vancomycin (VANCOCIN) 1,250 mg in sodium chloride 0.9 % 250 mL IVPB  Status:  Discontinued     1,250 mg 166.7 mL/hr over 90 Minutes Intravenous Every 12 hours 04/05/16 0954 04/08/16 0739   04/05/16 1200  piperacillin-tazobactam (ZOSYN) IVPB 3.375 g     3.375 g 12.5 mL/hr over 240 Minutes Intravenous Every 8 hours 04/05/16 0730     04/05/16 1100  vancomycin (VANCOCIN) 1,500 mg in sodium chloride 0.9 % 500 mL IVPB     1,500 mg 250 mL/hr over 120 Minutes Intravenous  Once 04/05/16 0952 04/05/16 1251   04/05/16 0345  piperacillin-tazobactam (ZOSYN) IVPB 3.375 g     3.375 g 12.5 mL/hr over 240 Minutes Intravenous  Once 04/05/16 0340 04/05/16 0800      Assessment/Plan: s/p Procedure(s): CHOLECYSTECTOMY Impression: Patient continues to improve. He does have anemia secondary to acute surgical blood loss. Will give 2 units of packed red blood cells. I do not feel he is actively bleeding. His vital signs continued to be stable. Okay for transfer to the floor. We'll advance to soft diet. The question at this time is whether the patient needs to be continued on Xeralto as the family states that he was about to stop it.  LOS: 4 days    Jared Bridges A 04/09/2016

## 2016-04-09 NOTE — Progress Notes (Signed)
PT Cancellation Note  Patient Details Name: Jared Bridges MRN: [id number] DOB: Apr 16, 1930   Cancelled Treatment:    Reason Eval/Treat Not Completed: Patient not medically ready  Pt with low H&H at 7.6/22.5 and orders to receive 2 units of PRBC's.  Will check back this afternoon for PT tx as schedule allows.    Beth Al Gagen, PT, DPT X: [id number]   04/09/2016, 8:40 AM

## 2016-04-09 NOTE — Progress Notes (Addendum)
PROGRESS NOTE        PATIENT DETAILS Name: Jared Bridges Age: 82 y.o. Sex: male Date of Birth: June 27, 1930 Admit Date: 04/04/2016 Admitting Physician Oswald Hillock, MD [id number] C, MD Outpatient Specialists: Dr. Domenic Polite  Brief Narrative: Patient is a 82 y.o. male prior CVA without any focal deficits, chronic migraine headaches, dyslipidemia, recent left knee replacement-on prophylactic Xarelto (last dose 5/5 am)- who presented with right upper quadrant abdominal pain. CT of the abdomen and ultrasound of the gallbladder suspicious for acute cholecystitis. Underwent Open cholecystectomy on 5/8. See below for further details  Subjective: Passing flatus-Awake,alert. Pain controlled  Assessment/Plan: Active Problems: Acute cholecystitis: slowly improving, s/p Open cholecystectomy on 5/8. Intraoperative course complicated by acute blood loss from bleeding hepatic artery, requiring 3 units of PRBC in the operating room. Leukocytosis and LFT's downtrending,  continue with Zosyn for now-suspect all antibiotics could be discontinued soon. Mobilize with physical therapy,and when okay with general surgery-transfer to MedSurg unit.  Acute Blood Loss Anemia: Likely secondary to intraoperative acute blood loss-from bleeding hepatic artery, required 3 units of PRBC transfusion.Hb decreased to 7.6, Gen surgery has ordered 2 more units of PRBC today. Follow   Transient hypotension: Resolved. Occurred postoperatively-likely due to acute blood loss. Required 3 units of PRBC intraoperatively. Although pressors ordered-never started, stress dosed with steroids-as on tapering steroids as an outpatient for polymyalgia. Start tapering Solu-Cortef to prednisone.  Orthostatic Hypotension:suspect from blood loss/recent surgery etc, have asked RN to recheck later today-after PRBC transfusion.  Acute Urinary Retention:foley catheter placed in the OR on 5/8, this was removed and voiding  trial was attempted on 5/9-he however re-developed urinary retention-and foley catheter was replaced. Given significant orthostatics on 5/9-Flomax was not started-will see if he still has orthostatics post transfusion-if none-will start Flomax, and attempt voiding trial prior to discharge.   Recent left total knee revision: was on prophylactic Xarelto prior to this admission-last dose on 5/5 AM. Xarelto currently on hold, resume once okay with general surgery.Left knee-with no obvious infection-however has skin discoloration.   History of CVA: Nonfocal exam-continue statin   History of aortic stenosis: Systolic murmur [id number] echocardiogram January 2017 shows mild aortic stenosis. Tolerated recent left knee revision arthroplasty major complications, and now has tolerated open cholecystectomy pretty well.   History of migraine headaches: Currently headache free-continue Depakote and Lyrica  Hypertension:Not on any antihypertensives-blood pressure well controlled.  History of diabetes: Not on oral hypoglycemic agents at home-CBGs stable with SSI. Follow.  History of peptic ulcer disease: continue Pepcid  History of polymyalgia rheumatica: On prednisone twice a week as an outpatient-prior to this, patient was on daily steroids approximately a month back. Since developed acute blood loss hypertension, was stress dosed steroids postoperatively. Will start tapering Solu-Cortef.  DVT Prophylaxis: SCD's  Code Status: Full code   Family Communication: None at bedside   Disposition Plan: Remain inpatient-but will plan on Home health vs SNF on discharge-await PT evaluation  Antimicrobial agents: IV Zosyn 5/6>> IV vancomycin 5/6>>5/9  Procedures: Open Cholecystectomy 5/8  CONSULTS:  general surgery  Time spent: 25 minutes-Greater than 50% of this time was spent in counseling, explanation of diagnosis, planning of further management, and coordination of  care.  MEDICATIONS: Anti-infectives    Start     Dose/Rate Route Frequency Ordered Stop   04/05/16 2300  vancomycin (VANCOCIN) 1,250 mg in sodium  chloride 0.9 % 250 mL IVPB  Status:  Discontinued     1,250 mg 166.7 mL/hr over 90 Minutes Intravenous Every 12 hours 04/05/16 0954 04/08/16 0739   04/05/16 1200  piperacillin-tazobactam (ZOSYN) IVPB 3.375 g     3.375 g 12.5 mL/hr over 240 Minutes Intravenous Every 8 hours 04/05/16 0730     04/05/16 1100  vancomycin (VANCOCIN) 1,500 mg in sodium chloride 0.9 % 500 mL IVPB     1,500 mg 250 mL/hr over 120 Minutes Intravenous  Once 04/05/16 0952 04/05/16 1251   04/05/16 0345  piperacillin-tazobactam (ZOSYN) IVPB 3.375 g     3.375 g 12.5 mL/hr over 240 Minutes Intravenous  Once 04/05/16 0340 04/05/16 0800      Scheduled Meds: . sodium chloride   Intravenous Once  . sodium chloride   Intravenous Once  . acetaminophen  650 mg Oral Once  . diphenhydrAMINE  25 mg Intravenous Once  . divalproex  250 mg Oral q morning - 10a  . famotidine  20 mg Oral BID  . hydrocortisone sod succinate (SOLU-CORTEF) inj  25 mg Intravenous Q8H  . insulin aspart  0-9 Units Subcutaneous TID WC  . magnesium hydroxide  30 mL Oral BID  . piperacillin-tazobactam (ZOSYN)  IV  3.375 g Intravenous Q8H  . pregabalin  25 mg Oral QHS  . venlafaxine XR  75 mg Oral Daily   Continuous Infusions: . lactated ringers 50 mL/hr at 04/08/16 2025   PRN Meds:.acetaminophen **OR** acetaminophen, clonazePAM, morphine injection, ondansetron **OR** ondansetron (ZOFRAN) IV, oxyCODONE, simethicone   PHYSICAL EXAM: Vital signs: Filed Vitals:   04/09/16 0300 04/09/16 0400 04/09/16 0500 04/09/16 0744  BP: 138/61 138/63 141/56   Pulse: 63 65 63   Temp:  98.1 F (36.7 C)  98.1 F (36.7 C)  TempSrc:  Oral  Oral  Resp: 22 24 23    Height:      Weight:   99.6 kg (219 lb 9.3 oz)   SpO2: 94% 95% 94%    Filed Weights   04/05/16 0444 04/08/16 0500 04/09/16 0500  Weight: 92.171 kg (203  lb 3.2 oz) 97.8 kg (215 lb 9.8 oz) 99.6 kg (219 lb 9.3 oz)   Body mass index is 28.18 kg/(m^2).   Gen Exam: Awake and alert with clear speech. Not in any distress Neck: Supple, No JVD.   Chest: B/L Clear anteriorly.   CVS: S1 S2 Regular, 3/6 systolic murmur.  Abdomen: soft, BS +, appropriately tender at the operative site-drain in place.  Extremities: no edema, lower extremities warm to touch. Neurologic: Non Focal.   Skin: No Rash or lesions   Wounds: N/A.    LABORATORY DATA: CBC:  Recent Labs Lab 04/05/16 0001  04/06/16 0542 04/07/16 0509 04/07/16 1347 04/08/16 0412 04/09/16 0432  WBC 12.7*  < > 27.9* 20.0* 20.4* 18.7* 15.4*  NEUTROABS 11.0*  --   --   --   --   --   --   HGB 11.6*  < > 10.9* 10.3* 11.7* 9.6* 7.6*  HCT 35.4*  < > 33.3* 31.4* 34.8* 27.8* 22.5*  MCV 95.9  < > 97.9 96.6 93.3 91.1 91.1  PLT 227  < > 216 209 198 194 188  < > = values in this interval not displayed.  Basic Metabolic Panel:  Recent Labs Lab 04/06/16 0542 04/07/16 0509 04/07/16 1347 04/08/16 0412 04/09/16 0432  NA 135 136 134* 136 135  K 3.6 3.5 4.1 4.1 3.7  CL 99* 102 103 104 101  CO2 27 28 25 26 28   GLUCOSE 160* 133* 184* 174* 150*  BUN 21* 23* 24* 31* 27*  CREATININE 1.01 0.98 1.04 1.17 1.05  CALCIUM 8.0* 8.1* 7.3* 7.6* 7.7*  MG  --   --   --  1.6* 2.1  PHOS  --   --   --  3.0 2.4*    GFR: Estimated Creatinine Clearance: 67.3 mL/min (by C-G formula based on Cr of 1.05).  Liver Function Tests:  Recent Labs Lab 04/05/16 0548 04/06/16 0542 04/07/16 0509 04/08/16 0412 04/09/16 0432  AST 24 25 225* 319* 103*  ALT 19 23 208* 322* 198*  ALKPHOS 67 57 109 97 83  BILITOT 1.0 1.3* 4.2* 2.4* 1.1  PROT 6.6 6.1* 5.8* 4.8* 4.8*  ALBUMIN 3.4* 2.9* 2.6* 2.0* 2.0*    Recent Labs Lab 04/05/16 0001  LIPASE 14   No results for input(s): AMMONIA in the last 168 hours.  Coagulation Profile:  Recent Labs Lab 04/05/16 0548  INR 1.29    Cardiac Enzymes: No results for  input(s): CKTOTAL, CKMB, CKMBINDEX, TROPONINI in the last 168 hours.  BNP (last 3 results) No results for input(s): PROBNP in the last 8760 hours.  HbA1C: No results for input(s): HGBA1C in the last 72 hours.  CBG:  Recent Labs Lab 04/08/16 0723 04/08/16 1132 04/08/16 1629 04/08/16 1953 04/09/16 0738  GLUCAP 167* 212* 191* 186* 157*    Lipid Profile: No results for input(s): CHOL, HDL, LDLCALC, TRIG, CHOLHDL, LDLDIRECT in the last 72 hours.  Thyroid Function Tests: No results for input(s): TSH, T4TOTAL, FREET4, T3FREE, THYROIDAB in the last 72 hours.  Anemia Panel: No results for input(s): VITAMINB12, FOLATE, FERRITIN, TIBC, IRON, RETICCTPCT in the last 72 hours.  Urine analysis:    Component Value Date/Time   COLORURINE YELLOW 04/04/2016 2347   APPEARANCEUR CLEAR 04/04/2016 2347   LABSPEC 1.015 04/04/2016 2347   PHURINE 8.0 04/04/2016 2347   GLUCOSEU NEGATIVE 04/04/2016 2347   HGBUR SMALL* 04/04/2016 2347   BILIRUBINUR NEGATIVE 04/04/2016 2347   KETONESUR 15* 04/04/2016 2347   PROTEINUR TRACE* 04/04/2016 2347   UROBILINOGEN 1.0 05/21/2015 1050   NITRITE NEGATIVE 04/04/2016 2347   LEUKOCYTESUR NEGATIVE 04/04/2016 2347    Sepsis Labs: Lactic Acid, Venous    Component Value Date/Time   LATICACIDVEN 1.9 04/05/2016 0001    MICROBIOLOGY: Recent Results (from the past 240 hour(s))  Urine culture     Status: Abnormal   Collection Time: 04/04/16 11:47 PM  Result Value Ref Range Status   Specimen Description URINE, CLEAN CATCH  Final   Special Requests NONE  Final   Culture (A)  Final    3,000 COLONIES/mL INSIGNIFICANT GROWTH Performed at [facility]    Report Status 04/07/2016 FINAL  Final  Surgical pcr screen     Status: None   Collection Time: 04/06/16 10:46 AM  Result Value Ref Range Status   MRSA, PCR NEGATIVE NEGATIVE Final   Staphylococcus aureus NEGATIVE NEGATIVE Final    Comment:        The Xpert SA Assay (FDA approved for NASAL  specimens in patients over 19 years of age), is one component of a comprehensive surveillance program.  Test performance has been validated by [facility] for patients greater than or equal to 64 year old. It is not intended to diagnose infection nor to guide or monitor treatment.     RADIOLOGY STUDIES/RESULTS: Ct Abdomen Pelvis Wo Contrast  04/05/2016  CLINICAL DATA:  Sudden onset generalized abdominal  pain for 8 hours. EXAM: CT ABDOMEN AND PELVIS WITHOUT CONTRAST TECHNIQUE: Multidetector CT imaging of the abdomen and pelvis was performed following the standard protocol without IV contrast. COMPARISON:  01/13/2013 FINDINGS: The gallbladder is distended with sludge or noncalcified calculi. There is inflammatory stranding adjacent to the gallbladder and along the undersurface of the liver. There is no bile duct dilatation. No focal liver lesions are evident on this unenhanced scan. The pancreas is unremarkable.  No pancreatic duct dilatation. The spleen, adrenals and kidneys exhibit unremarkable unenhanced appearances. Ureters and urinary bladder are unremarkable. There are normal appearances of the stomach, small bowel and colon. The appendix is normal. The abdominal aorta has a mild infrarenal fusiform dilatation but maximum AP diameter is only 2.9 cm. It resumes normal caliber at the bifurcation. There is no extraluminal air.  There is no ascites. There is no significant skeletal lesion. There is prior instrumented fusion at L4-5 with pedicle screw fixation and interbody fusion cage. There is no significant abnormality in the lower chest. IMPRESSION: Abnormal gallbladder, appearing distended and thickened with adjacent inflammatory stranding. The stranding opacities continue along the undersurface of the liver. Suspicious for cholecystitis. Consider sonography for better characterization. These results will be called to the ordering clinician or representative by the Radiologist Assistant, and  communication documented in the PACS or zVision Dashboard. Electronically Signed   By: Andreas Newport M.D.   On: 04/05/2016 03:21   US Abdomen Complete  04/05/2016  CLINICAL DATA:  Right upper quadrant pain.  Cholecystitis. EXAM: ABDOMEN ULTRASOUND COMPLETE COMPARISON:  CT 04/05/2016 FINDINGS: Gallbladder: Over distended with 1 cm wall thickness that is heterogeneous and striated. There is no focal tenderness, but this could be confounded by analgesic administration. Internally there is echogenic and partially shadowing material likely calculi and hemorrhage as seen on previous CT. Cannot exclude a gallbladder mass. Common bile duct: Diameter: 3 mm were seen.  Initial encounter. Liver: No focal lesion identified. Within normal limits in parenchymal echogenicity. IVC: No abnormality visualized. Pancreas: Essentially nonvisualized due to bowel gas. Recent visualization by CT. Spleen: Size and appearance within normal limits. Fluid-filled structure and neighboring the spleen is stomach based on comparison CT. Right Kidney: Length: 13 cm. Echogenicity within normal limits. No mass or hydronephrosis visualized. Left Kidney: Length: 12 cm. Echogenicity within normal limits. No mass or hydronephrosis visualized. Abdominal aorta: No aneurysm visualized sonographically. Ectatic distal aorta 29 mm by CT. IMPRESSION: 1. Findings consistent with acute cholecystitis. Murphy's sign is negative, but could be confounded by analgesic medication or advanced disease with insensate gallbladder. Extensive debris in the gallbladder lumen, at least partially hemorrhage based on previous CT. A gallbladder mass cannot be excluded. 2. No bile duct dilatation. Electronically Signed   By: Monte Fantasia M.D.   On: 04/05/2016 09:37   Dg Chest Port 1 View  04/07/2016  CLINICAL DATA:  Shortness of Breath EXAM: PORTABLE CHEST 1 VIEW COMPARISON:  January 07, 2016 FINDINGS: There is no edema or consolidation. Heart is upper normal in size  with pulmonary vascularity within normal limits. There is epicardial fat prominence at the right heart border. No adenopathy. There is chronic acromioclavicular separation on the right. IMPRESSION: No edema or consolidation. Stable cardiac silhouette. Chronic acromioclavicular separation on the right. Electronically Signed   By: Lowella Grip III M.D.   On: 04/07/2016 14:29     LOS: 4 days   Oren Binet, MD  [facility] Pager:[phone]  If 7PM-7AM, please contact night-coverage [email or web address] Password [email or web address] 04/09/2016, 7:50  AM    

## 2016-04-09 NOTE — Progress Notes (Signed)
Physical Therapy Treatment Patient Details Name: Jared Bridges MRN: [id number] DOB: 15-Apr-1930 Today's Date: 04/09/2016    History of Present Illness 82 yo M admitted with abdominal pain.  CT of the abdomen (+) for cholecystitis.  Pt is now s/p open cholecystectomy on 04/07/2016 with acute blood loss requiring 3 units of PRBC's. H&H today: 9.6/27.8.  Extensive PMH: L TKA with infection and I&D, then revision on 02/28/2016.  MI, DM2, spinal fusion, R femur surgery, R shoulder surgery, HTN, CVA, Diverticulitis s/p colectomy, PUD, AAA, GI bleed, peripheral neuropathy, polymyalgia rheumatica. L knee meniscus repair, hernia repair.       PT Comments    Pt received in bed, but was agreeable to PT tx.  Pt continues to require assistance for transfer supine<>sit, and sit<>stand with RW.  Pt also continued to demonstrate orthostatic hypotension as follows: Supine: 140/67, HR: 85bpm Sitting: 123/60, HR: 78bpm Standing: 111/55, HR: 72bpm Recovery sitting up in recliner: 145/58, HR: 84bpm  Pt expressed feeling dizzy upon sitting and standing, however needed to have a BM, therefore, pt was assisted onto [facility], and then CGA with RW for transfer BSC<>chair.  Further mobility limited due to continued feeling of "wooziness" despite BP return to 145/58.  Will continue to progress with bed mobility, transfers, and gait as pt is able. Continue to recommend HHPT.   Follow Up Recommendations  Home health PT     Equipment Recommendations       Recommendations for Other Services       Precautions / Restrictions Precautions Precaution Comments: Abdominal precautions s/p cholecystectomy.  Restrictions Weight Bearing Restrictions: No    Mobility  Bed Mobility Overal bed mobility: Modified Independent Bed Mobility: Supine to Sit     Supine to sit: HOB elevated;Min assist;Mod assist     General bed mobility comments: Pt required increased cues for bed mobility today, and tc's for hand placement for  attempts of log rolling.  Will need to continue to progress technique.   Transfers Overall transfer level: Needs assistance Equipment used: Rolling walker (2 wheeled) Transfers: Stand Pivot Transfers Sit to Stand: Min guard Stand pivot transfers: Min guard       General transfer comment: vc's for hand placement. Continued orthostatic hypotension, however not as severe as yesterday.  After obtaining BP's, pt was able to transfer to the [facility] and had a BM.  RN present to assist with hygiene afterwards, and then pt able to take a few steps and transfer BSC<>chair.   Ambulation/Gait Ambulation/Gait assistance: Min guard Ambulation Distance (Feet): 4 Feet Assistive device: Rolling walker (2 wheeled) Gait Pattern/deviations: Step-to pattern;Trunk flexed     General Gait Details: Pt was able to take a few steps to transfer from the BSC<>chair. Further mobility limited due to pt feeling "woozy" with BP returned to WNL.    Stairs            Wheelchair Mobility    Modified Rankin (Stroke Patients Only)       Balance           Standing balance support: Bilateral upper extremity supported Standing balance-Leahy Scale: Fair                      Cognition Arousal/Alertness: Awake/alert Behavior During Therapy: WFL for tasks assessed/performed Overall Cognitive Status: Within Functional Limits for tasks assessed                      Exercises  General Comments        Pertinent Vitals/Pain Pain Assessment: No/denies pain    Home Living                      Prior Function            PT Goals (current goals can now be found in the care plan section) Acute Rehab PT Goals Patient Stated Goal: Pt wants to get back home, and get stronger.  PT Goal Formulation: With patient Time For Goal Achievement: 04/15/16 Potential to Achieve Goals: Good Progress towards PT goals: Progressing toward goals    Frequency  Min 5X/week    PT Plan  Current plan remains appropriate    Co-evaluation             End of Session Equipment Utilized During Treatment: Gait belt Activity Tolerance: Patient tolerated treatment well Patient left: in bed;with call bell/phone within reach     Time: [id number] PT Time Calculation (min) (ACUTE ONLY): 24 min  Charges:  $Therapeutic Activity: 23-37 mins                    G Codes:      Beth Chukwuma Straus, PT, DPT X: [id number]  04/09/2016, 1:43 PM

## 2016-04-09 NOTE — Progress Notes (Signed)
   04/09/16 1319  Orthostatic Lying   BP- Lying 140/67 mmHg  Pulse- Lying 70  Orthostatic Sitting  BP- Sitting 123/60 mmHg  Pulse- Sitting 70  Orthostatic Standing at 0 minutes  BP- Standing at 0 minutes 111/55 mmHg  Pulse- Standing at 0 minutes 74

## 2016-04-09 NOTE — Progress Notes (Signed)
   04/09/16 1243  Unmeasured Output  Stool Occurrence 1  Stool Characteristics  Bowel Incontinence No  Stool Consistency Loose  Has the patient had 3 loose stools in the last 24 hours? No  Stool Color Brown  Stool Amount Medium  Stool Source Rectum

## 2016-04-10 LAB — TYPE AND SCREEN
ABO/RH(D): O POS
Antibody Screen: NEGATIVE
UNIT DIVISION: 0
UNIT DIVISION: 0
UNIT DIVISION: 0
Unit division: 0
Unit division: 0
Unit division: 0

## 2016-04-10 LAB — CBC
HEMATOCRIT: 30.9 % — AB (ref 39.0–52.0)
HEMOGLOBIN: 10.5 g/dL — AB (ref 13.0–17.0)
MCH: 30.6 pg (ref 26.0–34.0)
MCHC: 34 g/dL (ref 30.0–36.0)
MCV: 90.1 fL (ref 78.0–100.0)
Platelets: 191 10*3/uL (ref 150–400)
RBC: 3.43 MIL/uL — AB (ref 4.22–5.81)
RDW: 15.7 % — ABNORMAL HIGH (ref 11.5–15.5)
WBC: 10.2 10*3/uL (ref 4.0–10.5)

## 2016-04-10 LAB — GLUCOSE, CAPILLARY
GLUCOSE-CAPILLARY: 153 mg/dL — AB (ref 65–99)
GLUCOSE-CAPILLARY: 194 mg/dL — AB (ref 65–99)
Glucose-Capillary: 146 mg/dL — ABNORMAL HIGH (ref 65–99)
Glucose-Capillary: 173 mg/dL — ABNORMAL HIGH (ref 65–99)

## 2016-04-10 LAB — HEPATIC FUNCTION PANEL
ALT: 156 U/L — AB (ref 17–63)
AST: 63 U/L — AB (ref 15–41)
Albumin: 2.3 g/dL — ABNORMAL LOW (ref 3.5–5.0)
Alkaline Phosphatase: 102 U/L (ref 38–126)
Bilirubin, Direct: 0.4 mg/dL (ref 0.1–0.5)
Indirect Bilirubin: 0.9 mg/dL (ref 0.3–0.9)
TOTAL PROTEIN: 5.5 g/dL — AB (ref 6.5–8.1)
Total Bilirubin: 1.3 mg/dL — ABNORMAL HIGH (ref 0.3–1.2)

## 2016-04-10 MED ORDER — FLUTICASONE PROPIONATE 50 MCG/ACT NA SUSP: 1.0000 | Freq: Two times a day (BID) | NASAL | Status: DC | PRN

## 2016-04-10 NOTE — Progress Notes (Signed)
3 Days Post-Op  Subjective: Patient feels well. Denies any significant abdominal pain. Tolerating soft diet well. Did have a bowel movement yesterday.  Objective: Vital signs in last 24 hours: Temp:  [97.9 F (36.6 C)-98.4 F (36.9 C)] 98.4 F (36.9 C) (05/10 2233) Pulse Rate:  [62-72] 63 (05/10 2233) Resp:  [18-22] 22 (05/10 2233) BP: (135-159)/(61-70) 156/62 mmHg (05/10 2233) SpO2:  [95 %-98 %] 97 % (05/10 2233) Last BM Date: 04/09/16  Intake/Output from previous day: 05/10 0701 - 05/11 0700 In: 2653.2 [P.O.:240; I.V.:1593.2; Blood:670; IV Piggyback:150] Out: 1925 [Urine:1825; Drains:100] Intake/Output this shift:    General appearance: alert, cooperative and no distress Resp: clear to auscultation bilaterally Cardio: regular rate and rhythm, S1, S2 normal, no murmur, click, rub or gallop GI: Soft, JP drainage serosanguineous in nature. Incision healing well. Bowel sounds active.  Lab Results:   Recent Labs  04/09/16 1344 04/10/16 0456  WBC 14.4* 10.2  HGB 10.2* 10.5*  HCT 29.7* 30.9*  PLT 181 191   BMET  Recent Labs  04/08/16 0412 04/09/16 0432  NA 136 135  K 4.1 3.7  CL 104 101  CO2 26 28  GLUCOSE 174* 150*  BUN 31* 27*  CREATININE 1.17 1.05  CALCIUM 7.6* 7.7*   PT/INR No results for input(s): LABPROT, INR in the last 72 hours.  Studies/Results: No results found.  Anti-infectives: Anti-infectives    Start     Dose/Rate Route Frequency Ordered Stop   04/05/16 2300  vancomycin (VANCOCIN) 1,250 mg in sodium chloride 0.9 % 250 mL IVPB  Status:  Discontinued     1,250 mg 166.7 mL/hr over 90 Minutes Intravenous Every 12 hours 04/05/16 0954 04/08/16 0739   04/05/16 1200  piperacillin-tazobactam (ZOSYN) IVPB 3.375 g     3.375 g 12.5 mL/hr over 240 Minutes Intravenous Every 8 hours 04/05/16 0730     04/05/16 1100  vancomycin (VANCOCIN) 1,500 mg in sodium chloride 0.9 % 500 mL IVPB     1,500 mg 250 mL/hr over 120 Minutes Intravenous  Once 04/05/16 0952  04/05/16 1251   04/05/16 0345  piperacillin-tazobactam (ZOSYN) IVPB 3.375 g     3.375 g 12.5 mL/hr over 240 Minutes Intravenous  Once 04/05/16 0340 04/05/16 0800      Assessment/Plan: s/p Procedure(s): CHOLECYSTECTOMY Impression: Continues to improve, status post open cholecystectomy, day 3. Will ambulate in hallway. Will remove Foley. Hopefully discharge in next 24-48 hours.  LOS: 5 days    Koby Hartfield A 04/10/2016

## 2016-04-10 NOTE — Progress Notes (Signed)
PROGRESS NOTE        PATIENT DETAILS Name: Jared Bridges Age: 82 y.o. Sex: male Date of Birth: 11/23/1930 Admit Date: 04/04/2016 Admitting Physician Oswald Hillock, MD TV:[id number] C, MD Outpatient Specialists: Dr. Domenic Polite  Brief Narrative: Patient is a 82 y.o. male prior CVA without any focal deficits, chronic migraine headaches, dyslipidemia, recent left knee replacement-on prophylactic Xarelto (last dose 5/5 am)- who presented with right upper quadrant abdominal pain. CT of the abdomen and ultrasound of the gallbladder suspicious for acute cholecystitis. Underwent Open cholecystectomy on 5/8. See below for further details  Subjective: Had BM yesterday-thinks he will have another one this am. Orthostatic vitals are a chronic issue for the patient. He wants to see if we can remove the foley again today.  Assessment/Plan: Active Problems: Acute cholecystitis: slowly improving, s/p Open cholecystectomy on 5/8. Intraoperative course complicated by acute blood loss from bleeding hepatic artery, requiring 3 units of PRBC in the operating room and 2 units on 5/10. Leukocytosis and LFT's downtrending,defer discontinuation of Zosyn to gen surgery.Still has RUQ drain in place.Continue to mobilize with physical therapy,and re-attempt voiding trial today.  Acute Blood Loss Anemia: Likely secondary to intraoperative acute blood loss-from bleeding hepatic artery, required 3 units of PRBC transfusion in the operating room and 2 units on 5/110.Hb stable at 10.5  Follow   Transient hypotension: Resolved. Occurred postoperatively-likely due to acute blood loss. Required 3 units of PRBC intraoperatively. Although pressors ordered-never started, stress dosed with steroids-as on tapering steroids as an outpatient for polymyalgia. Transitioned from Solu-Cortef to prednisone-continue taper.  Orthostatic Hypotension:chronic issue per patient-suspect worsened due to recent surgery  and acute blood loss-place thigh-high TED hose. Continue to follow from blood loss/recent surgery etc, have asked RN to recheck later today-after PRBC transfusion.  Acute Urinary Retention:foley catheter placed in the OR on 5/8, this was removed and voiding trial was attempted on 5/9-he however re-developed urinary retention-and foley catheter was replaced. Given significant orthostatics on 5/9-Flomax was not started-will need to reattempt voiding trial soon  Recent left total knee revision: was on prophylactic Xarelto prior to this admission-last dose on 5/5 AM. Xarelto was placed on hold-spoke with Ortho Dr Charolotte Eke to discontinue Xarelto -hence will not plan to resume.Left knee-with no obvious infection-however has skin discoloration.   History of CVA: Nonfocal exam-continue statin   History of aortic stenosis: Systolic murmur [id number] echocardiogram January 2017 shows mild aortic stenosis. Tolerated recent left knee revision arthroplasty major complications, and now has tolerated open cholecystectomy pretty well.   History of migraine headaches: Currently headache free-continue Depakote and Lyrica  Hypertension:Not on any antihypertensives-blood pressure well controlled.  History of diabetes: Not on oral hypoglycemic agents at home-CBGs stable with SSI. Follow.  History of peptic ulcer disease: continue Pepcid  History of polymyalgia rheumatica: On prednisone twice a week as an outpatient-prior to this, patient was on daily steroids approximately a month back. Since developed acute blood loss hypertension, was stress dosed steroids postoperatively. On tapering prednisone.  Other-Dr Arnoldo Morale will take patient to his service, Hospitalist will sign off.   DVT Prophylaxis: SCD's  Code Status: Full code   Family Communication: None at bedside   Disposition Plan: Remain inpatient-likely Home health on discharge  Antimicrobial agents: IV Zosyn 5/6>> IV vancomycin  5/6>>5/9  Procedures: Open Cholecystectomy 5/8  CONSULTS:  general surgery  Time spent: 25  minutes-Greater than 50% of this time was spent in counseling, explanation of diagnosis, planning of further management, and coordination of care.  MEDICATIONS: Anti-infectives    Start     Dose/Rate Route Frequency Ordered Stop   04/05/16 2300  vancomycin (VANCOCIN) 1,250 mg in sodium chloride 0.9 % 250 mL IVPB  Status:  Discontinued     1,250 mg 166.7 mL/hr over 90 Minutes Intravenous Every 12 hours 04/05/16 0954 04/08/16 0739   04/05/16 1200  piperacillin-tazobactam (ZOSYN) IVPB 3.375 g     3.375 g 12.5 mL/hr over 240 Minutes Intravenous Every 8 hours 04/05/16 0730     04/05/16 1100  vancomycin (VANCOCIN) 1,500 mg in sodium chloride 0.9 % 500 mL IVPB     1,500 mg 250 mL/hr over 120 Minutes Intravenous  Once 04/05/16 0952 04/05/16 1251   04/05/16 0345  piperacillin-tazobactam (ZOSYN) IVPB 3.375 g     3.375 g 12.5 mL/hr over 240 Minutes Intravenous  Once 04/05/16 0340 04/05/16 0800      Scheduled Meds: . divalproex  250 mg Oral q morning - 10a  . famotidine  20 mg Oral BID  . insulin aspart  0-9 Units Subcutaneous TID WC  . magnesium hydroxide  30 mL Oral BID  . piperacillin-tazobactam (ZOSYN)  IV  3.375 g Intravenous Q8H  . predniSONE  20 mg Oral BID WC  . pregabalin  25 mg Oral QHS  . venlafaxine XR  75 mg Oral Daily   Continuous Infusions: . lactated ringers 10 mL/hr at 04/09/16 1216   PRN Meds:.acetaminophen **OR** acetaminophen, clonazePAM, morphine injection, ondansetron **OR** ondansetron (ZOFRAN) IV, oxyCODONE, simethicone   PHYSICAL EXAM: Vital signs: Filed Vitals:   04/09/16 1230 04/09/16 1300 04/09/16 1400 04/09/16 2233  BP: 140/64 152/64 135/61 156/62  Pulse: 65 62 64 63  Temp: 97.9 F (36.6 C)   98.4 F (36.9 C)  TempSrc: Oral   Oral  Resp:    22  Height:      Weight:      SpO2: 97% 97% 97% 97%   Filed Weights   04/05/16 0444 04/08/16 0500 04/09/16 0500   Weight: 92.171 kg (203 lb 3.2 oz) 97.8 kg (215 lb 9.8 oz) 99.6 kg (219 lb 9.3 oz)   Body mass index is 28.18 kg/(m^2).   Gen Exam: Awake and alert with clear speech. Not in any distress Neck: Supple, No JVD.   Chest: B/L Clear anteriorly.   CVS: S1 S2 Regular, 3/6 systolic murmur.  Abdomen: soft, BS +, appropriately tender at the operative site-drain in place.  Extremities: no edema, lower extremities warm to touch. Neurologic: Non Focal.   Skin: No Rash or lesions   Wounds: N/A.    LABORATORY DATA: CBC:  Recent Labs Lab 04/05/16 0001  04/07/16 1347 04/08/16 0412 04/09/16 0432 04/09/16 1344 04/10/16 0456  WBC 12.7*  < > 20.4* 18.7* 15.4* 14.4* 10.2  NEUTROABS 11.0*  --   --   --   --   --   --   HGB 11.6*  < > 11.7* 9.6* 7.6* 10.2* 10.5*  HCT 35.4*  < > 34.8* 27.8* 22.5* 29.7* 30.9*  MCV 95.9  < > 93.3 91.1 91.1 89.2 90.1  PLT 227  < > 198 194 188 181 191  < > = values in this interval not displayed.  Basic Metabolic Panel:  Recent Labs Lab 04/06/16 0542 04/07/16 0509 04/07/16 1347 04/08/16 0412 04/09/16 0432  NA 135 136 134* 136 135  K 3.6 3.5 4.1 4.1  3.7  CL 99* 102 103 104 101  CO2 27 28 25 26 28   GLUCOSE 160* 133* 184* 174* 150*  BUN 21* 23* 24* 31* 27*  CREATININE 1.01 0.98 1.04 1.17 1.05  CALCIUM 8.0* 8.1* 7.3* 7.6* 7.7*  MG  --   --   --  1.6* 2.1  PHOS  --   --   --  3.0 2.4*    GFR: Estimated Creatinine Clearance: 67.3 mL/min (by C-G formula based on Cr of 1.05).  Liver Function Tests:  Recent Labs Lab 04/06/16 0542 04/07/16 0509 04/08/16 0412 04/09/16 0432 04/10/16 0456  AST 25 225* 319* 103* 63*  ALT 23 208* 322* 198* 156*  ALKPHOS 57 109 97 83 102  BILITOT 1.3* 4.2* 2.4* 1.1 1.3*  PROT 6.1* 5.8* 4.8* 4.8* 5.5*  ALBUMIN 2.9* 2.6* 2.0* 2.0* 2.3*    Recent Labs Lab 04/05/16 0001  LIPASE 14   No results for input(s): AMMONIA in the last 168 hours.  Coagulation Profile:  Recent Labs Lab 04/05/16 0548  INR 1.29     Cardiac Enzymes: No results for input(s): CKTOTAL, CKMB, CKMBINDEX, TROPONINI in the last 168 hours.  BNP (last 3 results) No results for input(s): PROBNP in the last 8760 hours.  HbA1C: No results for input(s): HGBA1C in the last 72 hours.  CBG:  Recent Labs Lab 04/09/16 0738 04/09/16 1123 04/09/16 1550 04/09/16 2059 04/10/16 0748  GLUCAP 157* 179* 141* 113* 146*    Lipid Profile: No results for input(s): CHOL, HDL, LDLCALC, TRIG, CHOLHDL, LDLDIRECT in the last 72 hours.  Thyroid Function Tests: No results for input(s): TSH, T4TOTAL, FREET4, T3FREE, THYROIDAB in the last 72 hours.  Anemia Panel: No results for input(s): VITAMINB12, FOLATE, FERRITIN, TIBC, IRON, RETICCTPCT in the last 72 hours.  Urine analysis:    Component Value Date/Time   COLORURINE YELLOW 04/04/2016 2347   APPEARANCEUR CLEAR 04/04/2016 2347   LABSPEC 1.015 04/04/2016 2347   PHURINE 8.0 04/04/2016 2347   GLUCOSEU NEGATIVE 04/04/2016 2347   HGBUR SMALL* 04/04/2016 2347   BILIRUBINUR NEGATIVE 04/04/2016 2347   KETONESUR 15* 04/04/2016 2347   PROTEINUR TRACE* 04/04/2016 2347   UROBILINOGEN 1.0 05/21/2015 1050   NITRITE NEGATIVE 04/04/2016 2347   LEUKOCYTESUR NEGATIVE 04/04/2016 2347    Sepsis Labs: Lactic Acid, Venous    Component Value Date/Time   LATICACIDVEN 1.9 04/05/2016 0001    MICROBIOLOGY: Recent Results (from the past 240 hour(s))  Urine culture     Status: Abnormal   Collection Time: 04/04/16 11:47 PM  Result Value Ref Range Status   Specimen Description URINE, CLEAN CATCH  Final   Special Requests NONE  Final   Culture (A)  Final    3,000 COLONIES/mL INSIGNIFICANT GROWTH Performed at [facility]    Report Status 04/07/2016 FINAL  Final  Surgical pcr screen     Status: None   Collection Time: 04/06/16 10:46 AM  Result Value Ref Range Status   MRSA, PCR NEGATIVE NEGATIVE Final   Staphylococcus aureus NEGATIVE NEGATIVE Final    Comment:        The Xpert SA  Assay (FDA approved for NASAL specimens in patients over 20 years of age), is one component of a comprehensive surveillance program.  Test performance has been validated by [facility] for patients greater than or equal to 66 year old. It is not intended to diagnose infection nor to guide or monitor treatment.     RADIOLOGY STUDIES/RESULTS: Ct Abdomen Pelvis Wo Contrast  04/05/2016  CLINICAL DATA:  Sudden onset generalized abdominal pain for 8 hours. EXAM: CT ABDOMEN AND PELVIS WITHOUT CONTRAST TECHNIQUE: Multidetector CT imaging of the abdomen and pelvis was performed following the standard protocol without IV contrast. COMPARISON:  01/13/2013 FINDINGS: The gallbladder is distended with sludge or noncalcified calculi. There is inflammatory stranding adjacent to the gallbladder and along the undersurface of the liver. There is no bile duct dilatation. No focal liver lesions are evident on this unenhanced scan. The pancreas is unremarkable.  No pancreatic duct dilatation. The spleen, adrenals and kidneys exhibit unremarkable unenhanced appearances. Ureters and urinary bladder are unremarkable. There are normal appearances of the stomach, small bowel and colon. The appendix is normal. The abdominal aorta has a mild infrarenal fusiform dilatation but maximum AP diameter is only 2.9 cm. It resumes normal caliber at the bifurcation. There is no extraluminal air.  There is no ascites. There is no significant skeletal lesion. There is prior instrumented fusion at L4-5 with pedicle screw fixation and interbody fusion cage. There is no significant abnormality in the lower chest. IMPRESSION: Abnormal gallbladder, appearing distended and thickened with adjacent inflammatory stranding. The stranding opacities continue along the undersurface of the liver. Suspicious for cholecystitis. Consider sonography for better characterization. These results will be called to the ordering clinician or representative by the  Radiologist Assistant, and communication documented in the PACS or zVision Dashboard. Electronically Signed   By: Andreas Newport M.D.   On: 04/05/2016 03:21   US Abdomen Complete  04/05/2016  CLINICAL DATA:  Right upper quadrant pain.  Cholecystitis. EXAM: ABDOMEN ULTRASOUND COMPLETE COMPARISON:  CT 04/05/2016 FINDINGS: Gallbladder: Over distended with 1 cm wall thickness that is heterogeneous and striated. There is no focal tenderness, but this could be confounded by analgesic administration. Internally there is echogenic and partially shadowing material likely calculi and hemorrhage as seen on previous CT. Cannot exclude a gallbladder mass. Common bile duct: Diameter: 3 mm were seen.  Initial encounter. Liver: No focal lesion identified. Within normal limits in parenchymal echogenicity. IVC: No abnormality visualized. Pancreas: Essentially nonvisualized due to bowel gas. Recent visualization by CT. Spleen: Size and appearance within normal limits. Fluid-filled structure and neighboring the spleen is stomach based on comparison CT. Right Kidney: Length: 13 cm. Echogenicity within normal limits. No mass or hydronephrosis visualized. Left Kidney: Length: 12 cm. Echogenicity within normal limits. No mass or hydronephrosis visualized. Abdominal aorta: No aneurysm visualized sonographically. Ectatic distal aorta 29 mm by CT. IMPRESSION: 1. Findings consistent with acute cholecystitis. Murphy's sign is negative, but could be confounded by analgesic medication or advanced disease with insensate gallbladder. Extensive debris in the gallbladder lumen, at least partially hemorrhage based on previous CT. A gallbladder mass cannot be excluded. 2. No bile duct dilatation. Electronically Signed   By: Monte Fantasia M.D.   On: 04/05/2016 09:37   Dg Chest Port 1 View  04/07/2016  CLINICAL DATA:  Shortness of Breath EXAM: PORTABLE CHEST 1 VIEW COMPARISON:  January 07, 2016 FINDINGS: There is no edema or consolidation. Heart  is upper normal in size with pulmonary vascularity within normal limits. There is epicardial fat prominence at the right heart border. No adenopathy. There is chronic acromioclavicular separation on the right. IMPRESSION: No edema or consolidation. Stable cardiac silhouette. Chronic acromioclavicular separation on the right. Electronically Signed   By: Lowella Grip III M.D.   On: 04/07/2016 14:29     LOS: 5 days   Oren Binet, MD  [facility] Pager:[phone]  If  7PM-7AM, please contact night-coverage [email or web address] Password [facility] 04/10/2016, 8:37 AM

## 2016-04-10 NOTE — Care Management Note (Signed)
Case Management Note  Patient Details  Name: Jared Bridges MRN: [id number] Date of Birth: December 08, 1929  Subjective/Objective: Spoke with patient for continued assess. Patient from home with spouse and adult daughter. A and O and independent until coming to hospital. Spouse and daughter drive .  Stated hopefull MD will let him go tomorrow.  Has JP drain. May need [place] RN to reinforce JP drain teaching. Has insurance. Denies difficulty with medications.                      Action/Plan:Home with family assistance. [facility]   Expected Discharge Date:                  Expected Discharge Plan:  [place]  In-House Referral:  NA  Discharge planning Services  CM Consult  Post Acute Care Choice:  Home Health Choice offered to:  Patient  DME Arranged:    DME Agency:     HH Arranged:  PT, RN [place] Agency:  [place]  Status of Service:  In process, will continue to follow  Medicare Important Message Given:  Yes Date Medicare IM Given:    Medicare IM give by:    Date Additional Medicare IM Given:    Additional Medicare Important Message give by:     If discussed at [place] of Stay Meetings, dates discussed:    Additional Comments:  Alvie Heidelberg, RN 04/10/2016, 4:53 PM

## 2016-04-11 LAB — CBC
HEMATOCRIT: 29.8 % — AB (ref 39.0–52.0)
Hemoglobin: 10.1 g/dL — ABNORMAL LOW (ref 13.0–17.0)
MCH: 30.6 pg (ref 26.0–34.0)
MCHC: 33.9 g/dL (ref 30.0–36.0)
MCV: 90.3 fL (ref 78.0–100.0)
Platelets: 248 10*3/uL (ref 150–400)
RBC: 3.3 MIL/uL — ABNORMAL LOW (ref 4.22–5.81)
RDW: 14.8 % (ref 11.5–15.5)
WBC: 16.4 10*3/uL — AB (ref 4.0–10.5)

## 2016-04-11 LAB — HEPATIC FUNCTION PANEL
ALBUMIN: 2.4 g/dL — AB (ref 3.5–5.0)
ALK PHOS: 91 U/L (ref 38–126)
ALT: 110 U/L — ABNORMAL HIGH (ref 17–63)
AST: 39 U/L (ref 15–41)
Bilirubin, Direct: 0.4 mg/dL (ref 0.1–0.5)
Indirect Bilirubin: 0.6 mg/dL (ref 0.3–0.9)
TOTAL PROTEIN: 5.4 g/dL — AB (ref 6.5–8.1)
Total Bilirubin: 1 mg/dL (ref 0.3–1.2)

## 2016-04-11 LAB — GLUCOSE, CAPILLARY
Glucose-Capillary: 129 mg/dL — ABNORMAL HIGH (ref 65–99)
Glucose-Capillary: 224 mg/dL — ABNORMAL HIGH (ref 65–99)

## 2016-04-11 MED ORDER — CIPROFLOXACIN HCL 500 MG PO TABS: 500.0000 mg | ORAL_TABLET | Freq: Two times a day (BID) | ORAL | Status: DC

## 2016-04-11 MED ORDER — TRAMADOL HCL 50 MG PO TABS: 50.0000 mg | ORAL_TABLET | Freq: Two times a day (BID) | ORAL | Status: DC | PRN

## 2016-04-11 NOTE — Progress Notes (Signed)
ANTIBIOTIC CONSULT NOTE  Pharmacy Consult for Zosyn Indication: Intra Abdominal Infection  Allergies  Allergen Reactions  . Calan [Verapamil] Other (See Comments)    Weakness  . Cymbalta [Duloxetine Hcl] Other (See Comments)    Makes patient have jerking motions.  . Iodinated Diagnostic Agents Hives and Itching    Can pre-med with benadryl  . Iodine Hives and Itching    very allergic(per daughter), can pre-med with benadryl  . Latex Other (See Comments)    Redness iritation  . Lisinopril Other (See Comments)    Makes patient have jerking motions.  . Neurontin [Gabapentin] Other (See Comments)    Makes patient have jerking motions.  [name] Kitchen Zoloft [Sertraline Hcl] Diarrhea and Other (See Comments)    Makes patient have jerking motions.  . Protonix [Pantoprazole Sodium] Other (See Comments)    "Did not work"  . Valium [Diazepam] Other (See Comments)    "Did not work"  . Flomax [Tamsulosin Hcl] Other (See Comments)    Dizziness  . Glimepiride Other (See Comments)    "Did not work"  . Melatonin Nausea Only  . Tape Other (See Comments)    Skin irritation    Patient Measurements: Height: 6\' 2"  (188 cm) Weight: 219 lb 9.3 oz (99.6 kg) IBW/kg (Calculated) : 82.2 Adjusted Body Weight:   Vital Signs: Temp: 98 F (36.7 C) (05/12 0554) Temp Source: Oral (05/12 0554) BP: 160/62 mmHg (05/12 0554) Pulse Rate: 62 (05/12 0554) Intake/Output from previous day: 05/11 0701 - 05/12 0700 In: 750 [P.O.:600; IV Piggyback:150] Out: 30 [Drains:30] Intake/Output from this shift: Total I/O In: 120 [P.O.:120] Out: -   Labs:  Recent Labs  04/09/16 0432 04/09/16 1344 04/10/16 0456 04/11/16 0440  WBC 15.4* 14.4* 10.2 16.4*  HGB 7.6* 10.2* 10.5* 10.1*  PLT 188 181 191 248  CREATININE 1.05  --   --   --    Estimated Creatinine Clearance: 67.3 mL/min (by C-G formula based on Cr of 1.05). No results for input(s): VANCOTROUGH, VANCOPEAK, VANCORANDOM, GENTTROUGH, GENTPEAK, GENTRANDOM,  TOBRATROUGH, TOBRAPEAK, TOBRARND, AMIKACINPEAK, AMIKACINTROU, AMIKACIN in the last 72 hours.   Microbiology: Recent Results (from the past 720 hour(s))  Urine culture     Status: Abnormal   Collection Time: 04/04/16 11:47 PM  Result Value Ref Range Status   Specimen Description URINE, CLEAN CATCH  Final   Special Requests NONE  Final   Culture (A)  Final    3,000 COLONIES/mL INSIGNIFICANT GROWTH Performed at [facility]    Report Status 04/07/2016 FINAL  Final  Surgical pcr screen     Status: None   Collection Time: 04/06/16 10:46 AM  Result Value Ref Range Status   MRSA, PCR NEGATIVE NEGATIVE Final   Staphylococcus aureus NEGATIVE NEGATIVE Final    Comment:        The Xpert SA Assay (FDA approved for NASAL specimens in patients over 54 years of age), is one component of a comprehensive surveillance program.  Test performance has been validated by [facility] for patients greater than or equal to 71 year old. It is not intended to diagnose infection nor to guide or monitor treatment.     Medical History: Past Medical History  Diagnosis Date  . Diverticulitis   . Syncope   . Hypercholesteremia   . GI bleed     Dr. Laural Golden - 1998  . Peptic ulcer disease   . Anemia, iron deficiency   . Depression   . Peripheral neuropathy ([place])   . Polymyalgia  rheumatica ([place])   . Essential hypertension     UNDER CONTROL   . Heart murmur   . Myocardial infarction [facility])     ???? IN COLLEGE   NOTHING FOUND ON CATH (Q WAVE SHOWS)  . Stroke ([place])     11/2015      TROUBLE READING+ WRITING   . Thyroid nodule   . Type 2 diabetes mellitus ([name]     DIET CONTROLLED   . Chronic kidney disease     RESOLVED  . Chronic headaches     HX  MIGRAINES     Medications:  Scheduled:  . divalproex  250 mg Oral q morning - 10a  . famotidine  20 mg Oral BID  . insulin aspart  0-9 Units Subcutaneous TID WC  . magnesium hydroxide  30 mL Oral BID  . piperacillin-tazobactam (ZOSYN)  IV   3.375 g Intravenous Q8H  . predniSONE  20 mg Oral BID WC  . pregabalin  25 mg Oral QHS  . venlafaxine XR  75 mg Oral Daily   Assessment: 82 yo male admitted right upper quadrant pain s/p open cholecystectomy.  He is on day #7 zosyn. Patient improving, possible discharge in 24-48 hours per surgery.    Goal of Therapy:  Eradicate infection  Plan:  Zosyn 3.375 GM IV every 8 hours (4 hour infusion) LOT antibiotics? F/U labs, cultures  Isac Sarna, BS Pharm D, BCPS Clinical Pharmacist Pager [phone] 04/11/2016,9:35 AM

## 2016-04-11 NOTE — Care Management Important Message (Signed)
Important Message  Patient Details  Name: Jared Bridges MRN: [id number] Date of Birth: 03/08/1930   Medicare Important Message Given:  Yes    Alvie Heidelberg, RN 04/11/2016, 9:44 AM

## 2016-04-11 NOTE — Discharge Instructions (Signed)
Open Cholecystectomy, Care After Refer to this sheet in the next few weeks. These instructions provide you with information on caring for yourself after your procedure. Your health care provider may also give you more specific instructions. Your treatment has been planned according to current medical practices, but problems sometimes occur. Call your health care provider if you have any problems or questions after your procedure. WHAT TO EXPECT AFTER THE PROCEDURE After your procedure, it is typical to have the following:  Pain at your incision site. You will be given medicines to control this pain.  Constipation. You may be given stool softeners to help prevent this. HOME CARE INSTRUCTIONS   Change bandages (dressings) as directed by your health care provider.  Keep the wound dry and clean. You may wash the wound gently with soap and water. Gently blot or dab the wound dry.  It is okay to take showers 48 hours after surgery. Do not take baths or use swimming pools or hot tubs for 2 weeks or until your health care provider approves.  Only take over-the-counter or prescription medicines as directed by your health care provider.  Continue your normal diet as directed by your health care provider. Eat plenty of fruits and vegetables to help prevent constipation.  Drink enough fluids to keep your urine clear or pale yellow. This also helps prevent constipation.  Do not lift anything heavier than 10 pounds (4.5 kg) for 1 month or until your health care provider approves.  Do not play contact sports for 1 month or until your health care provider approves.  Do not return to work or school for 4 weeks or until your health care provider approves. SEEK MEDICAL CARE IF:   You have redness, swelling, or increasing pain in the wound.  You see a yellowish-white fluid (pus) coming from the wound.  You have drainage from the wound that lasts longer than 1 day.  You notice a bad smell coming from  the wound or dressing.  Your wound breaks open after the stitches (sutures) or staples have been removed.  You have increasing pain in the shoulders (shoulder strap areas).  You have dizzy episodes or faint while standing.  You feel sick to your stomach (nauseous) or throw up (vomit). SEEK IMMEDIATE MEDICAL CARE IF:   You have a fever.  You develop a rash.  You have difficulty breathing.  You have chest pain.  You have severe abdominal pain.  You have leg pain.   This information is not intended to replace advice given to you by your health care provider. Make sure you discuss any questions you have with your health care provider.   Document Released: 03/04/2004 Document Revised: 09/07/2013 Document Reviewed: 06/29/2013 Elsevier Interactive Patient Education [employer].

## 2016-04-11 NOTE — Progress Notes (Signed)
Pt IV removed, tolerated well.  Reviewed discharge instructions and prescriptions with pt and wife.  Answered questions at this time.

## 2016-04-11 NOTE — Discharge Summary (Signed)
Physician Discharge Summary  Patient ID: Jared Bridges MRN: [id number] DOB/AGE: [birth year] 82 y.o.  Admit date: 04/04/2016 Discharge date: 04/11/2016  Admission Diagnoses:Acute cholecystitis, cholelithiasis  Discharge Diagnoses: Gangrene of gallbladder Active Problems:   Acute cholecystitis Anticoagulation, chronic Hypertension  Discharged Condition: good  Hospital Course: Patient is an 82 year old white male who presented to the emergency room on 04/04/2016 with worsening right upper quadrant abdominal pain. Ultrasound the gallbladder revealed gallbladder sludge with a thickened gallbladder wall. Initially, his white blood cell count was within normal limits, but the following day, markedly increased. He had been Alen Blew, thus his surgery had to be delayed for 48 hours. He subsequently was taken to the operating room on 04/07/2016 and underwent open cholecystectomy. He had a gangrenous, inflamed gallbladder. During surgery, he had excessive blood loss secondary to bleeding from the cystic/hepatic artery. He went to the ICU for postoperative care. He did receive a total of 5 units of packed red blood cells throughout his stay. His blood pressure did remain stable. His liver enzyme tests subsequently normalized. His leukocytosis greatly improved. His hemoglobin has remained stable. In discussion with his orthopedic surgeon, it was okay to discontinue the [place]. [place]. The night prior to discharge, he did experience some sundowning, but he is easily reoriented. He is being discharged home on 04/11/2016 in good and improving condition.  Treatments: surgery: Open cholecystectomy on 04/07/2016  Discharge Exam: Blood pressure 160/62, pulse 62, temperature 98 F (36.7 C), temperature source Oral, resp. rate 20, height 6\' 2"  (1.88 m), weight 99.6 kg (219 lb 9.3 oz), SpO2 95 %. General appearance: alert, cooperative, appears stated age and no distress Eyes: negative, conjunctivae/corneas clear.  PERRL, EOM's intact. Fundi benign. Resp: clear to auscultation bilaterally Cardio: regular rate and rhythm, S1, S2 normal, no murmur, click, rub or gallop GI: Soft, incision healing well. JP drain removed. Bowel sounds active.  Disposition: 01-Home or Self Care     Medication List    STOP taking these medications        HYDROcodone-acetaminophen 5-325 MG tablet  Commonly known as:  NORCO/VICODIN     oxyCODONE 10 mg 12 hr tablet  Commonly known as:  OXYCONTIN     oxyCODONE 5 MG immediate release tablet  Commonly known as:  Oxy IR/ROXICODONE     XARELTO 10 MG Tabs tablet  Generic drug:  rivaroxaban      TAKE these medications        aspirin EC 325 MG tablet  Take 1 tablet (325 mg total) by mouth 2 (two) times daily.     atorvastatin 40 MG tablet  Commonly known as:  LIPITOR  Take 1 tablet (40 mg total) by mouth daily at 6 PM.     ciprofloxacin 500 MG tablet  Commonly known as:  CIPRO  Take 1 tablet (500 mg total) by mouth 2 (two) times daily.     clonazePAM 1 MG tablet  Commonly known as:  KLONOPIN  Take 1 tablet (1 mg total) by mouth at bedtime.     diclofenac sodium 1 % Gel  Commonly known as:  VOLTAREN  Apply 2 g topically daily as needed (for pain).     divalproex 250 MG DR tablet  Commonly known as:  DEPAKOTE  Take 1 tablet (250 mg total) by mouth every morning.     escitalopram 5 MG tablet  Commonly known as:  LEXAPRO  Take 5 mg by mouth at bedtime.     fluticasone 50 MCG/ACT nasal spray  Commonly known as:  FLONASE  Place 1 spray into both nostrils 2 (two) times daily as needed for allergies.     methocarbamol 750 MG tablet  Commonly known as:  ROBAXIN  Take 1 tablet (750 mg total) by mouth 2 (two) times daily as needed for muscle spasms.     ondansetron 4 MG tablet  Commonly known as:  ZOFRAN  Take 1-2 tablets (4-8 mg total) by mouth every 8 (eight) hours as needed for nausea or vomiting.     predniSONE 5 MG tablet  Commonly known as:   DELTASONE  Take 5 mg by mouth 2 (two) times a week. Tuesday and Thursday     pregabalin 25 MG capsule  Commonly known as:  LYRICA  Take 25 mg by mouth at bedtime.     senna-docusate 8.6-50 MG tablet  Commonly known as:  SENOKOT S  Take 1 tablet by mouth at bedtime as needed.     traMADol 50 MG tablet  Commonly known as:  ULTRAM  Take 1 tablet (50 mg total) by mouth every 12 (twelve) hours as needed for moderate pain.     venlafaxine XR 75 MG 24 hr capsule  Commonly known as:  EFFEXOR-XR  Take 1 capsule by mouth daily.     venlafaxine XR 150 MG 24 hr capsule  Commonly known as:  EFFEXOR-XR  Take 1 capsule (150 mg total) by mouth daily.     Vitamin D3 5000 units Caps  Take 5,000 Units by mouth at bedtime.         SignedAviva Signs A 04/11/2016, 12:06 PM

## 2016-06-11 ENCOUNTER — Other Ambulatory Visit: Payer: Self-pay

## 2016-06-11 DIAGNOSIS — G44019 Episodic cluster headache, not intractable: Secondary | ICD-10-CM

## 2016-06-11 DIAGNOSIS — G43111 Migraine with aura, intractable, with status migrainosus: Secondary | ICD-10-CM

## 2016-06-11 MED ORDER — DIVALPROEX SODIUM 250 MG PO DR TAB: 250.0000 mg | DELAYED_RELEASE_TABLET | Freq: Every morning | ORAL | Status: DC

## 2016-06-16 ENCOUNTER — Other Ambulatory Visit (HOSPITAL_COMMUNITY): Payer: Self-pay | Admitting: Internal Medicine

## 2016-06-16 DIAGNOSIS — R4182 Altered mental status, unspecified: Secondary | ICD-10-CM

## 2016-06-26 ENCOUNTER — Ambulatory Visit (HOSPITAL_COMMUNITY)
Admission: RE | Admit: 2016-06-26 | Discharge: 2016-06-26 | Disposition: A | Discharge status: Home / Self Care | Payer: Medicare Other | Source: Ambulatory Visit | Attending: Internal Medicine | Admitting: Internal Medicine

## 2016-06-26 DIAGNOSIS — R4182 Altered mental status, unspecified: Secondary | ICD-10-CM

## 2016-06-26 DIAGNOSIS — I6782 Cerebral ischemia: Secondary | ICD-10-CM | POA: Insufficient documentation

## 2016-06-26 DIAGNOSIS — G319 Degenerative disease of nervous system, unspecified: Secondary | ICD-10-CM | POA: Diagnosis not present

## 2016-07-01 ENCOUNTER — Emergency Department (HOSPITAL_COMMUNITY)
Admission: EM | Admit: 2016-07-01 | Discharge: 2016-07-01 | Disposition: A | Discharge status: Home / Self Care | Payer: Medicare Other | Attending: Emergency Medicine | Admitting: Emergency Medicine

## 2016-07-01 ENCOUNTER — Encounter (HOSPITAL_COMMUNITY): Payer: Self-pay | Admitting: Emergency Medicine

## 2016-07-01 DIAGNOSIS — R079 Chest pain, unspecified: Secondary | ICD-10-CM | POA: Diagnosis not present

## 2016-07-01 DIAGNOSIS — N189 Chronic kidney disease, unspecified: Secondary | ICD-10-CM | POA: Diagnosis not present

## 2016-07-01 DIAGNOSIS — G43909 Migraine, unspecified, not intractable, without status migrainosus: Secondary | ICD-10-CM | POA: Insufficient documentation

## 2016-07-01 DIAGNOSIS — G43009 Migraine without aura, not intractable, without status migrainosus: Secondary | ICD-10-CM

## 2016-07-01 DIAGNOSIS — I129 Hypertensive chronic kidney disease with stage 1 through stage 4 chronic kidney disease, or unspecified chronic kidney disease: Secondary | ICD-10-CM | POA: Diagnosis not present

## 2016-07-01 DIAGNOSIS — Z87891 Personal history of nicotine dependence: Secondary | ICD-10-CM | POA: Diagnosis not present

## 2016-07-01 DIAGNOSIS — E119 Type 2 diabetes mellitus without complications: Secondary | ICD-10-CM | POA: Diagnosis not present

## 2016-07-01 DIAGNOSIS — Z79899 Other long term (current) drug therapy: Secondary | ICD-10-CM | POA: Diagnosis not present

## 2016-07-01 LAB — CBC WITH DIFFERENTIAL/PLATELET
BASOS ABS: 0 10*3/uL (ref 0.0–0.1)
Basophils Relative: 0 %
Eosinophils Absolute: 0.2 10*3/uL (ref 0.0–0.7)
Eosinophils Relative: 2 %
HEMATOCRIT: 42.3 % (ref 39.0–52.0)
HEMOGLOBIN: 13.5 g/dL (ref 13.0–17.0)
LYMPHS PCT: 10 %
Lymphs Abs: 1 10*3/uL (ref 0.7–4.0)
MCH: 30.5 pg (ref 26.0–34.0)
MCHC: 31.9 g/dL (ref 30.0–36.0)
MCV: 95.5 fL (ref 78.0–100.0)
MONO ABS: 0.9 10*3/uL (ref 0.1–1.0)
Monocytes Relative: 9 %
NEUTROS ABS: 8.5 10*3/uL — AB (ref 1.7–7.7)
NEUTROS PCT: 79 %
Platelets: 223 10*3/uL (ref 150–400)
RBC: 4.43 MIL/uL (ref 4.22–5.81)
RDW: 13.7 % (ref 11.5–15.5)
WBC: 10.6 10*3/uL — ABNORMAL HIGH (ref 4.0–10.5)

## 2016-07-01 LAB — COMPREHENSIVE METABOLIC PANEL
ALBUMIN: 4.1 g/dL (ref 3.5–5.0)
ALK PHOS: 81 U/L (ref 38–126)
ALT: 22 U/L (ref 17–63)
AST: 24 U/L (ref 15–41)
Anion gap: 4 — ABNORMAL LOW (ref 5–15)
BILIRUBIN TOTAL: 0.7 mg/dL (ref 0.3–1.2)
BUN: 25 mg/dL — AB (ref 6–20)
CALCIUM: 8.9 mg/dL (ref 8.9–10.3)
CO2: 28 mmol/L (ref 22–32)
CREATININE: 1.02 mg/dL (ref 0.61–1.24)
Chloride: 102 mmol/L (ref 101–111)
GFR calc Af Amer: >60 mL/min (ref >60)
GLUCOSE: 150 mg/dL — AB (ref 65–99)
POTASSIUM: 4.3 mmol/L (ref 3.5–5.1)
Sodium: 134 mmol/L — ABNORMAL LOW (ref 135–145)
TOTAL PROTEIN: 7.6 g/dL (ref 6.5–8.1)

## 2016-07-01 MED ORDER — HYDROCODONE-ACETAMINOPHEN 5-325 MG PO TABS: ORAL_TABLET | ORAL | 0 refills | Status: DC

## 2016-07-01 MED ORDER — METOCLOPRAMIDE HCL 5 MG/ML IJ SOLN
5.0000 mg | Freq: Once | INTRAMUSCULAR | Status: AC
  Administered 2016-07-01: 5 mg via INTRAVENOUS
  Filled 2016-07-01: qty 2

## 2016-07-01 MED ORDER — DIPHENHYDRAMINE HCL 50 MG/ML IJ SOLN
12.5000 mg | Freq: Once | INTRAMUSCULAR | Status: AC
  Administered 2016-07-01: 12.5 mg via INTRAVENOUS
  Filled 2016-07-01: qty 1

## 2016-07-01 MED ORDER — HYDROCODONE-ACETAMINOPHEN 5-325 MG PO TABS
1.0000 | ORAL_TABLET | Freq: Once | ORAL | Status: AC
  Administered 2016-07-01: 1 via ORAL
  Filled 2016-07-01: qty 1

## 2016-07-01 MED ORDER — KETOROLAC TROMETHAMINE 30 MG/ML IJ SOLN
15.0000 mg | Freq: Once | INTRAMUSCULAR | Status: AC
  Administered 2016-07-01: 15 mg via INTRAVENOUS
  Filled 2016-07-01: qty 1

## 2016-07-01 NOTE — ED Triage Notes (Signed)
Pt reports headache x3 weeks with ora.  Pt has hx tia.  Pt also not eating and drinking as well, having vision changes.  Pt denies unilateral weakness/slurred speech/facial droop.  Pt had MRI on 7/27 and has not received the result.  Pt sees Dr. Anastasio Champion.

## 2016-07-01 NOTE — Discharge Instructions (Signed)
Follow-up with your doctor later this week if not improving °

## 2016-07-01 NOTE — ED Provider Notes (Signed)
[facility] DEPT Provider Note   CSN: [id number] Arrival date & time: 07/01/16  1627  First Provider Contact:  None       History   Chief Complaint Chief Complaint  Patient presents with  . Migraine    HPI Jared Bridges is a 82 y.o. male.  Patient complains of a headache for a number days. She had an MRI of his head  done by his doctor that was unremarkable   The history is provided by the patient. No language interpreter was used.  Migraine  This is a recurrent problem. The current episode started more than 2 days ago. The problem occurs constantly. The problem has not changed since onset.Associated symptoms include chest pain and headaches. Pertinent negatives include no abdominal pain. Nothing aggravates the symptoms. Nothing relieves the symptoms. He has tried acetaminophen for the symptoms. The treatment provided no relief.    Past Medical History:  Diagnosis Date  . Anemia, iron deficiency   . Chronic headaches    HX  MIGRAINES   . Chronic kidney disease    RESOLVED  . Depression   . Diverticulitis   . Essential hypertension    UNDER CONTROL   . GI bleed    Dr. Laural Golden - 1998  . Heart murmur   . Hypercholesteremia   . Myocardial infarction [facility])    ???? IN COLLEGE   NOTHING FOUND ON CATH (Q WAVE SHOWS)  . Peptic ulcer disease   . Peripheral neuropathy ([place])   . Polymyalgia rheumatica ([place])   . Stroke ([place])    11/2015      TROUBLE READING+ WRITING   . Syncope   . Thyroid nodule   . Type 2 diabetes mellitus ([place])    DIET CONTROLLED     Patient Active Problem List   Diagnosis Date Noted  . Acute cholecystitis 04/05/2016  . 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 Total knee replacement status 02/28/2016  . CVA (cerebral infarction) 12/19/2015  . Acute CVA (cerebrovascular accident) ([place]) 12/19/2015  . Carotid stenosis 12/19/2015  . Postoperative wound dehiscence 07/09/2015  . Type 2 diabetes mellitus, uncontrolled ([place]) 06/29/2015    . Cellulitis of knee, left 06/29/2015  . Wound infection after surgery 06/29/2015  . OA (osteoarthritis) of knee 05/28/2015  . BRBPR (bright red blood per rectum) 11/21/2014  . Orthostatic hypotension 11/21/2014  . Dizziness 11/16/2014  . Polymyalgia rheumatica ([place]) 09/12/2014  . Anemia, iron deficiency 08/16/2014  . DJD (degenerative joint disease) 08/16/2014  . Essential hypertension   . Type 2 diabetes mellitus ([place])   . Cataract, nuclear 04/11/2014  . Complaining of back-related symptom 03/15/2014  . Arthralgia of hip or thigh 03/15/2014  . Gonalgia 10/10/2013  . 1St degree AV block 08/04/2013  . Infection of the upper respiratory tract 04/05/2013  . Cataract 02/24/2013  . Pseudoaphakia 02/24/2013  . Episode of syncope 01/11/2013  . Cardiac murmur 11/09/2012  . Near syncope 11/09/2012  . Anxiety 06/18/2012  . Clinical depression 06/18/2012  . Bleeding gastrointestinal 06/18/2012  . Diverticulitis 06/18/2012  . Difficulty hearing 06/18/2012  . Adaptive colitis 06/18/2012  . Arthritis, degenerative 06/18/2012  . Disease of thyroid gland 06/18/2012  . Multinodular goiter 01/29/2012  . 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 Adenoma of large intestine 05/31/2011  . Cephalalgia 05/31/2011  . Hypercholesterolemia 05/31/2011  . Benign fibroma of prostate 05/31/2011  . LBP (low back pain) 05/31/2011  . Diabetes mellitus ([place]) 05/31/2011  . Essential (primary) hypertension 05/31/2011    Past  Surgical History:  Procedure Laterality Date  . APPENDECTOMY     as child  . BACK SURGERY     2000 IDET SPINAL PROC  . CHOLECYSTECTOMY N/A 04/07/2016   Procedure: CHOLECYSTECTOMY;  Surgeon: Aviva Signs, MD;  Location: [facility];  Service: General;  Laterality: N/A;  . [place]  . CRYOTHERAPY    . EYE SURGERY     right cataract with lens implant  . FEMUR SURGERY     RT LEG        1953  . HAND SURGERY     RIGHT  . HERNIA REPAIR     1-right inguinal, 3- left  inguinal  . IRRIGATION AND DEBRIDEMENT KNEE Left 07/09/2015   Procedure: LEFT KNEE IRRIGATION AND DEBRIDEMENT WOUND CLOSURE;  Surgeon: Gaynelle Arabian, MD;  Location: [facility];  Service: Orthopedics;  Laterality: Left;  . KNEE DEBRIDEMENT     2016   LEFT  . MENISCUS REPAIR     LEFT  . SHOULDER OPEN ROTATOR CUFF REPAIR     RT SHOULDER  . SPINAL FUSION    . SPINAL FUSION    . TONSILLECTOMY    . TOTAL KNEE ARTHROPLASTY Left 05/28/2015   Procedure: LEFT TOTAL KNEE ARTHROPLASTY;  Surgeon: Gaynelle Arabian, MD;  Location: [facility];  Service: Orthopedics;  Laterality: Left;  . TOTAL KNEE REVISION Left 02/28/2016   Procedure: LEFT TOTAL KNEE REVISION;  Surgeon: Leandrew Koyanagi, MD;  Location: [facility];  Service: Orthopedics;  Laterality: Left;  . TRIGGER FINGER RELEASE     RT HAND   1990S  . UVULECTOMY  2004   TURBINATE,TONSIL,ADENOID       Home Medications    Prior to Admission medications   Medication Sig Start Date End Date Taking? Authorizing Provider  atorvastatin (LIPITOR) 40 MG tablet Take 1 tablet (40 mg total) by mouth daily at 6 PM. 12/20/15   Orvan Falconer, MD  Cholecalciferol (VITAMIN D3) 5000 units CAPS Take 5,000 Units by mouth at bedtime.    Historical Provider, MD  ciprofloxacin (CIPRO) 500 MG tablet Take 1 tablet (500 mg total) by mouth 2 (two) times daily. 04/11/16   Aviva Signs, MD  clonazePAM (KLONOPIN) 1 MG tablet Take 1 tablet (1 mg total) by mouth at bedtime. Patient taking differently: Take 1 mg by mouth at bedtime as needed for anxiety.  11/22/15 11/21/16  Asencion Partridge Dohmeier, MD  diclofenac sodium (VOLTAREN) 1 % GEL Apply 2 g topically daily as needed (for pain). 11/22/15   Asencion Partridge Dohmeier, MD  divalproex (DEPAKOTE) 250 MG DR tablet Take 1 tablet (250 mg total) by mouth every morning. 06/11/16   Asencion Partridge Dohmeier, MD  escitalopram (LEXAPRO) 5 MG tablet Take 5 mg by mouth at bedtime. 01/31/16   Historical Provider, MD  fluticasone (FLONASE) 50 MCG/ACT nasal spray Place 1 spray into both nostrils  2 (two) times daily as needed for allergies.  01/07/16   Historical Provider, MD  HYDROcodone-acetaminophen (NORCO/VICODIN) 5-325 MG tablet Take one every 6-8hours for severe headache 07/01/16   Milton Ferguson, MD  predniSONE (DELTASONE) 5 MG tablet Take 5 mg by mouth 2 (two) times a week. Tuesday and Thursday 10/03/14   Historical Provider, MD  pregabalin (LYRICA) 25 MG capsule Take 25 mg by mouth at bedtime.    Historical Provider, MD  senna-docusate (SENOKOT S) 8.6-50 MG tablet Take 1 tablet by mouth at bedtime as needed. Patient not taking: Reported on 04/05/2016 02/28/16  Leandrew Koyanagi, MD  traMADol (ULTRAM) 50 MG tablet Take 1 tablet (50 mg total) by mouth every 12 (twelve) hours as needed for moderate pain. 04/11/16   Aviva Signs, MD  venlafaxine XR (EFFEXOR-XR) 75 MG 24 hr capsule Take 1 capsule by mouth daily. 03/24/16   Historical Provider, MD    Family History Family History  Problem Relation Age of Onset  . Colon cancer Sister   . Hypertension    . Depression Brother   . Depression Daughter     Social History Social History  Substance Use Topics  . Smoking status: Former Smoker    Packs/day: 0.10    Types: Cigarettes    Start date: 12/18/1958    Quit date: 12/01/1986  . Smokeless tobacco: Never Used  . Alcohol use No     Allergies   Calan [verapamil]; Cymbalta [duloxetine hcl]; Iodinated diagnostic agents; Iodine; Latex; Lisinopril; Neurontin [gabapentin]; Zoloft [sertraline hcl]; Protonix [pantoprazole sodium]; Valium [diazepam]; Flomax [tamsulosin hcl]; Glimepiride; Melatonin; and Tape   Review of Systems Review of Systems  Constitutional: Negative for appetite change and fatigue.  HENT: Negative for congestion, ear discharge and sinus pressure.   Eyes: Negative for discharge.  Respiratory: Negative for cough.   Cardiovascular: Positive for chest pain.  Gastrointestinal: Negative for abdominal pain and diarrhea.  Genitourinary: Negative for frequency and hematuria.    Musculoskeletal: Negative for back pain.  Skin: Negative for rash.  Neurological: Positive for headaches. Negative for seizures.  Psychiatric/Behavioral: Negative for hallucinations.     Physical Exam Updated Vital Signs BP 154/78 (BP Location: Right Arm)   Pulse 67   Temp 97.9 F (36.6 C) (Oral)   Resp 16   Ht 6\' 2"  (1.88 m)   Wt 200 lb (90.7 kg)   SpO2 100%   BMI 25.68 kg/m   Physical Exam  Constitutional: He is oriented to person, place, and time. He appears well-developed.  HENT:  Head: Normocephalic.  Eyes: Conjunctivae and EOM are normal. No scleral icterus.  Neck: Neck supple. No thyromegaly present.  Cardiovascular: Normal rate and regular rhythm.  Exam reveals no gallop and no friction rub.   No murmur heard. Pulmonary/Chest: No stridor. He has no wheezes. He has no rales. He exhibits no tenderness.  Abdominal: He exhibits no distension. There is no tenderness. There is no rebound.  Musculoskeletal: Normal range of motion. He exhibits no edema.  Lymphadenopathy:    He has no cervical adenopathy.  Neurological: He is oriented to person, place, and time. He exhibits normal muscle tone. Coordination normal.  Skin: No rash noted. No erythema.  Psychiatric: He has a normal mood and affect. His behavior is normal.     ED Treatments / Results  Labs (all labs ordered are listed, but only abnormal results are displayed) Labs Reviewed  CBC WITH DIFFERENTIAL/PLATELET - Abnormal; Notable for the following:       Result Value   WBC 10.6 (*)    Neutro Abs 8.5 (*)    All other components within normal limits  COMPREHENSIVE METABOLIC PANEL - Abnormal; Notable for the following:    Sodium 134 (*)    Glucose, Bld 150 (*)    BUN 25 (*)    Anion gap 4 (*)    All other components within normal limits    EKG  EKG Interpretation None       Radiology No results found.  Procedures Procedures (including critical care time)  Medications Ordered in ED Medications   HYDROcodone-acetaminophen (NORCO/VICODIN)  5-325 MG per tablet 1 tablet (not administered)  metoCLOPramide (REGLAN) injection 5 mg (5 mg Intravenous Given 07/01/16 1727)  diphenhydrAMINE (BENADRYL) injection 12.5 mg (12.5 mg Intravenous Given 07/01/16 1726)  ketorolac (TORADOL) 30 MG/ML injection 15 mg (15 mg Intravenous Given 07/01/16 1727)     Initial Impression / Assessment and Plan / ED Course  I have reviewed the triage vital signs and the nursing notes.  Pertinent labs & imaging results that were available during my care of the patient were reviewed by me and considered in my medical decision making (see chart for details).  Clinical Course    Patient with a migraine headache. Symptoms improved with treatment in the emergency department. Labs unremarkable patient will be sent home with some pain medicines in case it returns and will follow-up with his PCP  Final Clinical Impressions(s) / ED Diagnoses   Final diagnoses:  Nonintractable migraine, unspecified migraine type    New Prescriptions New Prescriptions   HYDROCODONE-ACETAMINOPHEN (NORCO/VICODIN) 5-325 MG TABLET    Take one every 6-8hours for severe headache     Milton Ferguson, MD 07/01/16 1850

## 2016-07-04 ENCOUNTER — Telehealth: Payer: Self-pay | Admitting: Cardiology

## 2016-07-04 NOTE — Telephone Encounter (Signed)
Called pt. No answer °

## 2016-07-04 NOTE — Telephone Encounter (Signed)
Patient's wife walked into office for [name] to call patient to discuss his symptoms and possibly be seen. I spoke with patient.  He has had a migraine for the past 6 days with NO cardiac issues.  Stated that he has been light headed/fuzzy with ringing in his ears when he stands.

## 2016-07-04 NOTE — Telephone Encounter (Signed)
Last saw Dr. Domenic Polite 03/2015.  Not sure if this problem is related to his BP or headaches.  Migraine has currently gone away.  Stated that BP has been running 130-140's / 60-70's.   Has seen PMD recently.  Scheduled with md at [facility] in October with geriatric psychiatrist.  Stated that he does notice that his BP drops with standing.  Advised patient that he would need to be evaluated by provider to discern what problem may be.  Advised to go to ED if symptoms worsen before then.  OV scheduled with Estella Husk, PA in the [place] office 07/16/2016 at 11:20.

## 2016-07-04 NOTE — Telephone Encounter (Signed)
Called pt, left message on mobile phone voicemail for pt to return call.

## 2016-07-11 ENCOUNTER — Encounter (HOSPITAL_BASED_OUTPATIENT_CLINIC_OR_DEPARTMENT_OTHER): Payer: Self-pay

## 2016-07-16 ENCOUNTER — Ambulatory Visit (INDEPENDENT_AMBULATORY_CARE_PROVIDER_SITE_OTHER): Payer: Medicare Other | Admitting: Physician Assistant

## 2016-07-16 ENCOUNTER — Encounter: Payer: Self-pay | Admitting: Physician Assistant

## 2016-07-16 VITALS — BP 108/58 | HR 85 | Ht 73.0 in | Wt 201.0 lb

## 2016-07-16 DIAGNOSIS — I951 Orthostatic hypotension: Secondary | ICD-10-CM

## 2016-07-16 DIAGNOSIS — R42 Dizziness and giddiness: Secondary | ICD-10-CM | POA: Diagnosis not present

## 2016-07-16 DIAGNOSIS — I639 Cerebral infarction, unspecified: Secondary | ICD-10-CM

## 2016-07-16 DIAGNOSIS — I350 Nonrheumatic aortic (valve) stenosis: Secondary | ICD-10-CM | POA: Diagnosis not present

## 2016-07-16 NOTE — Progress Notes (Signed)
Cardiology Office Note    Date:  07/16/2016   ID:  Jared Bridges, DOB [birth year], MRN [id number]  PCP:  Jared Albee, MD  Cardiologist:   Chief Complaint  Patient presents with  . Dizziness    History of Present Illness:  Jared Bridges is a 82 y.o. male  with history of orthostatic hypotension/dizziness in the setting of autonomic dysfunction as well as LVH with vigorous LVEF an intra-Cavitary gradient. He was last seen by Dr. Domenic Polite 03/2015 at which time he was having atypical chest pain. Patient also had a TIA in 12/2015 and is scheduled for an EEG and other neurological testing. He also takes medication for anxiety depression and hyperlipidemia.  The patient comes in today accompanied by his wife complaining of recurrent dizziness with change of position but also is feeling foggy all day long. He just doesn't feel well in general. He is not active because of multiple knee surgeries still lives a sedentary life. He denies any chest pain, palpitations, dyspnea. He has not had frank syncope. He follows a strict low-sodium diet. Patient does have a degree of dyspnea on exertion but attributes it to deconditioning. He has to take his time going up stairs because of chronic knee problems and has to take one step at a time. By the time he gets to the top he is usually a little winded but this is been going on for a long time and is not new.    Past Medical History:  Diagnosis Date  . Anemia, iron deficiency   . Chronic headaches    HX  MIGRAINES   . Chronic kidney disease    RESOLVED  . Depression   . Diverticulitis   . Essential hypertension    UNDER CONTROL   . GI bleed    Dr. Laural Golden - 1998  . Heart murmur   . Hypercholesteremia   . Myocardial infarction [facility])    ???? IN COLLEGE   NOTHING FOUND ON CATH (Q WAVE SHOWS)  . Peptic ulcer disease   . Peripheral neuropathy ([place])   . Polymyalgia rheumatica ([place])   . Stroke ([place])    11/2015      TROUBLE READING+ WRITING   .  Syncope   . Thyroid nodule   . Type 2 diabetes mellitus ([place])    DIET CONTROLLED     Past Surgical History:  Procedure Laterality Date  . APPENDECTOMY     as child  . BACK SURGERY     2000 IDET SPINAL PROC  . CHOLECYSTECTOMY N/A 04/07/2016   Procedure: CHOLECYSTECTOMY;  Surgeon: Aviva Signs, MD;  Location: [facility];  Service: General;  Laterality: N/A;  . [place]  . CRYOTHERAPY    . EYE SURGERY     right cataract with lens implant  . FEMUR SURGERY     RT LEG        1953  . HAND SURGERY     RIGHT  . HERNIA REPAIR     1-right inguinal, 3- left inguinal  . IRRIGATION AND DEBRIDEMENT KNEE Left 07/09/2015   Procedure: LEFT KNEE IRRIGATION AND DEBRIDEMENT WOUND CLOSURE;  Surgeon: Gaynelle Arabian, MD;  [facility];  Service: Orthopedics;  Laterality: Left;  . KNEE DEBRIDEMENT     2016   LEFT  . MENISCUS REPAIR     LEFT  . SHOULDER OPEN ROTATOR CUFF REPAIR     RT SHOULDER  . SPINAL FUSION    .  SPINAL FUSION    . TONSILLECTOMY    . TOTAL KNEE ARTHROPLASTY Left 05/28/2015   Procedure: LEFT TOTAL KNEE ARTHROPLASTY;  Surgeon: Gaynelle Arabian, MD;  [facility];  Service: Orthopedics;  Laterality: Left;  . TOTAL KNEE REVISION Left 02/28/2016   Procedure: LEFT TOTAL KNEE REVISION;  Surgeon: Leandrew Koyanagi, MD;  Location: [facility];  Service: Orthopedics;  Laterality: Left;  . TRIGGER FINGER RELEASE     RT HAND   1990S  . UVULECTOMY  2004   TURBINATE,TONSIL,ADENOID    Current Medications: Outpatient Medications Prior to Visit  Medication Sig Dispense Refill  . aspirin EC 81 MG tablet Take 81 mg by mouth every morning.    [name] Kitchen atorvastatin (LIPITOR) 40 MG tablet Take 1 tablet (40 mg total) by mouth daily at 6 PM. 30 tablet 1  . Cholecalciferol (VITAMIN D3) 5000 units CAPS Take 5,000 Units by mouth at bedtime.    . clonazePAM (KLONOPIN) 1 MG tablet Take 1 tablet (1 mg total) by mouth at bedtime. (Patient taking differently: Take 0.5 mg by mouth at bedtime.  ) 30 tablet 2  . diclofenac sodium (VOLTAREN) 1 % GEL Apply 2 g topically daily as needed (for pain). 2 g 1  . escitalopram (LEXAPRO) 5 MG tablet Take 5 mg by mouth at bedtime.  1  . fluticasone (FLONASE) 50 MCG/ACT nasal spray Place 1 spray into both nostrils 2 (two) times daily as needed for allergies.   1  . HYDROcodone-acetaminophen (NORCO/VICODIN) 5-325 MG tablet Take one every 6-8hours for severe headache 10 tablet 0  . predniSONE (DELTASONE) 5 MG tablet Take 5 mg by mouth 2 (two) times a week. Tuesday and Thursday  1  . pregabalin (LYRICA) 25 MG capsule Take 25 mg by mouth at bedtime.    . tamsulosin (FLOMAX) 0.4 MG CAPS capsule Take 0.4 mg by mouth at bedtime.    [name] Kitchen venlafaxine XR (EFFEXOR-XR) 75 MG 24 hr capsule Take 1 capsule by mouth daily.  0  . divalproex (DEPAKOTE) 250 MG DR tablet Take 1 tablet (250 mg total) by mouth every morning. 30 tablet 0   No facility-administered medications prior to visit.      Allergies:   Calan [verapamil]; Cymbalta [duloxetine hcl]; Iodinated diagnostic agents; Iodine; Latex; Lisinopril; Neurontin [gabapentin]; Zoloft [sertraline hcl]; Protonix [pantoprazole sodium]; Valium [diazepam]; Flomax [tamsulosin hcl]; Glimepiride; Melatonin; and Tape   Social History   Social History  . Marital status: Married    Spouse name: N/A  . Number of children: N/A  . Years of education: N/A   Social History Main Topics  . Smoking status: Former Smoker    Packs/day: 0.10    Types: Cigarettes    Start date: 12/18/1958    Quit date: 12/01/1986  . Smokeless tobacco: Never Used  . Alcohol use No  . Drug use: No  . Sexual activity: No   Other Topics Concern  . None   Social History Narrative  . None     Family History:  The patient's   family history includes Colon cancer in his sister; Depression in his brother and daughter.   ROS:   Please see the history of present illness.    Review of Systems  Constitution: Positive for weakness and  malaise/fatigue.  HENT: Negative.   Cardiovascular: Positive for near-syncope.  Respiratory: Negative.   Endocrine: Negative.   Hematologic/Lymphatic: Negative.   Musculoskeletal: Positive for joint pain, muscle weakness and myalgias.  Gastrointestinal: Negative.   Genitourinary: Negative.  Neurological: Positive for dizziness and numbness.  Psychiatric/Behavioral: Positive for depression. The patient is nervous/anxious.    All other systems reviewed and are negative.   PHYSICAL EXAM:   VS:  BP (!) 108/58   Pulse 85   Ht 6\' 1"  (1.854 m)   Wt 201 lb (91.2 kg)   SpO2 93%   BMI 26.52 kg/m   Physical Exam  GEN: Well nourished, well developed, in no acute distress Neck: no JVD, carotid bruits, or masses Cardiac:RRR; 2/6 harsh systolic murmur at the left sternal border, no rubs, or gallops  Respiratory:  clear to auscultation bilaterally, normal work of breathing GI: soft, nontender, nondistended, + BS Ext: without cyanosis, clubbing, or edema, Good distal pulses bilaterally MS: no deformity or atrophy Skin: warm and dry, no rash Psych: euthymic mood, full affect  Wt Readings from Last 3 Encounters:  07/16/16 201 lb (91.2 kg)  07/01/16 200 lb (90.7 kg)  04/09/16 219 lb 9.3 oz (99.6 kg)      Studies/Labs Reviewed:   EKG:  EKG is  ordered today.  The ekg ordered today demonstrates Normal sinus rhythm with first-degree AV block, no acute change  Recent Labs: 04/09/2016: Magnesium 2.1 07/01/2016: ALT 22; BUN 25; Creatinine, Ser 1.02; Hemoglobin 13.5; Platelets 223; Potassium 4.3; Sodium 134   Lipid Panel    Component Value Date/Time   CHOL 181 12/20/2015 0705   TRIG 177 (H) 12/20/2015 0705   HDL 36 (L) 12/20/2015 0705   CHOLHDL 5.0 12/20/2015 0705   VLDL 35 12/20/2015 0705   LDLCALC 110 (H) 12/20/2015 0705    Additional studies/ records that were reviewed today include:   Echocardiogram 11/22/2014: Study Conclusions  - Left ventricle: The cavity size was normal.  Wall thickness was   increased in a pattern of moderate to severe LVH. Systolic   function was vigorous. The estimated ejection fraction was in the   range of 65% to 70%. There is a subaortic gradient of 56 mmHg   created by the hyperdynamic LV and cavity obliteration. There was   dynamic obstruction, with a peak velocity of 374 cm/sec and a   peak gradient of 56 mm Hg. Doppler parameters are consistent with   abnormal left ventricular relaxation (grade 1 diastolic   dysfunction). - Aortic valve: Mildly calcified annulus. Trileaflet; mildly   thickened leaflets. There was mild to moderate regurgitation.   Valve area (VTI): 2.67 cm^2. Valve area (Vmax): 2.86 cm^2. Valve   area (Vmean): 3.02 cm^2. Regurgitation pressure half-time: 555   ms. - Mitral valve: Mildly calcified annulus. - Left atrium: The atrium was mildly dilated. - Systemic veins: IVC is small suggestive of low RA pressures and   hypovolemia. - Technically difficult study.      ASSESSMENT:    1. Orthostatic hypotension   2. Aortic stenosis   3. Dizziness      PLAN:  In order of problems listed above:  Orthostatic hypotension with autonomic dysfunction. Recommend increase fluids and sodium intake. He is not on any medications that we can stop. He has taken Effexor and Klonopin and followed up with neurologist with EEG in the near future.  Aortic stenosis patient had mild AI on last 2-D echo 2 years ago. We'll check 2-D echo to rule out progression of his aortic stenosis that could be contributing to his symptoms.  Dizziness secondary to orthostatic hypotension and autonomic dysfunction see above    Medication Adjustments/Labs and Tests Ordered: Current medicines are reviewed at length with the  patient today.  Concerns regarding medicines are outlined above.  Medication changes, Labs and Tests ordered today are listed in the Patient Instructions below. Patient Instructions  Medication Instructions:  Your  physician recommends that you continue on your current medications as directed. Please refer to the Current Medication list given to you today.   Labwork: NONE  Testing/Procedures: Your physician has requested that you have an echocardiogram. Echocardiography is a painless test that uses sound waves to create images of your heart. It provides your doctor with information about the size and shape of your heart and how well your heart's chambers and valves are working. This procedure takes approximately one hour. There are no restrictions for this procedure.    Follow-Up: Your physician recommends that you schedule a follow-up appointment in: [place]    Any Other Special Instructions Will Be Listed Below (If Applicable). INCREASE SALT INTAKE     If you need a refill on your cardiac medications before your next appointment, please call your pharmacy.      Signed, Ermalinda Barrios, PA-C  07/16/2016 3:20 PM    [facility] [place], [place], [place]  [address] Phone: [phone]; Fax: [phone]

## 2016-07-16 NOTE — Patient Instructions (Addendum)
Medication Instructions:  Your physician recommends that you continue on your current medications as directed. Please refer to the Current Medication list given to you today.   Labwork: NONE  Testing/Procedures: Your physician has requested that you have an echocardiogram. Echocardiography is a painless test that uses sound waves to create images of your heart. It provides your doctor with information about the size and shape of your heart and how well your heart's chambers and valves are working. This procedure takes approximately one hour. There are no restrictions for this procedure.    Follow-Up: Your physician recommends that you schedule a follow-up appointment in: [place]    Any Other Special Instructions Will Be Listed Below (If Applicable). INCREASE SALT INTAKE     If you need a refill on your cardiac medications before your next appointment, please call your pharmacy.

## 2016-07-18 ENCOUNTER — Ambulatory Visit (HOSPITAL_COMMUNITY)
Admission: RE | Admit: 2016-07-18 | Discharge: 2016-07-18 | Disposition: A | Discharge status: Home / Self Care | Payer: Medicare Other | Source: Ambulatory Visit | Attending: Physician Assistant | Admitting: Physician Assistant

## 2016-07-18 DIAGNOSIS — I440 Atrioventricular block, first degree: Secondary | ICD-10-CM | POA: Diagnosis not present

## 2016-07-18 DIAGNOSIS — E119 Type 2 diabetes mellitus without complications: Secondary | ICD-10-CM | POA: Insufficient documentation

## 2016-07-18 DIAGNOSIS — I351 Nonrheumatic aortic (valve) insufficiency: Secondary | ICD-10-CM | POA: Insufficient documentation

## 2016-07-18 DIAGNOSIS — I340 Nonrheumatic mitral (valve) insufficiency: Secondary | ICD-10-CM | POA: Insufficient documentation

## 2016-07-18 DIAGNOSIS — I10 Essential (primary) hypertension: Secondary | ICD-10-CM | POA: Diagnosis not present

## 2016-07-18 DIAGNOSIS — I350 Nonrheumatic aortic (valve) stenosis: Secondary | ICD-10-CM | POA: Diagnosis not present

## 2016-07-18 DIAGNOSIS — E785 Hyperlipidemia, unspecified: Secondary | ICD-10-CM | POA: Diagnosis not present

## 2016-07-18 NOTE — Progress Notes (Signed)
*  PRELIMINARY RESULTS* Echocardiogram 2D Echocardiogram has been performed.  Jared Bridges 07/18/2016, 1:49 PM

## 2016-07-19 ENCOUNTER — Encounter (INDEPENDENT_AMBULATORY_CARE_PROVIDER_SITE_OTHER): Payer: Self-pay

## 2016-07-23 ENCOUNTER — Ambulatory Visit: Payer: Medicare Other | Attending: Neurology | Admitting: Neurology

## 2016-07-23 VITALS — Ht 73.0 in | Wt 201.0 lb

## 2016-07-23 DIAGNOSIS — G4769 Other sleep related movement disorders: Secondary | ICD-10-CM | POA: Insufficient documentation

## 2016-07-23 DIAGNOSIS — G4733 Obstructive sleep apnea (adult) (pediatric): Secondary | ICD-10-CM

## 2016-07-30 ENCOUNTER — Encounter: Payer: Self-pay | Admitting: *Deleted

## 2016-07-31 ENCOUNTER — Encounter: Payer: Self-pay | Admitting: Cardiology

## 2016-07-31 NOTE — Progress Notes (Signed)
Cardiology Office Note  Date: 08/01/2016   ID: Jared Bridges, DOB 1930-09-05, MRN [id number]  PCP: Doree Albee, MD  Primary Cardiologist: Rozann Lesches, MD   Chief Complaint  Patient presents with  . Follow-up dizziness    History of Present Illness: Jared Bridges is an 82 y.o. male seen recently by Ms. Jared Bridges in mid August. He was evaluated at that time for recurring dizziness, felt to have orthostatic hypotension complicated by autonomic dysfunction. Increased fluid and sodium intake were recommended and he was also referred for a follow-up echocardiogram given history of aortic valve disease. LVEF was found to be normal with grade 1 diastolic dysfunction and he had overall mild aortic stenosis and mild aortic regurgitation.  I last saw Mr. Jared back in April 2016. He is here today with his daughter. They both had multiple questions which we addressed. He has a fairly long-standing history of orthostatic intolerance, and I think that at least this is contributed to by medications such as Flomax and Effexor, although autonomic dysfunction is still an underlying issue. He did have transcranial Dopplers last July which show a component of moderate vertebrobasilar insufficiency as well.  Today we discussed a trial of Florinef. We also discussed possibility of using graduated compression stockings, but he wanted to hold off on this for now.  Past Medical History:  Diagnosis Date  . Anemia, iron deficiency   . Chronic headaches    Migraines  . Chronic kidney disease    Resolved  . Depression   . Diverticulitis   . Essential hypertension   . GI bleed    Dr. Laural Golden - 1998  . Hypercholesteremia   . Myocardial infarction [facility])    Possible remote history - details not clear  . Peptic ulcer disease   . Peripheral neuropathy ([place])   . Polymyalgia rheumatica ([place])   . Stroke [facility])    Residual trouble reading and writing  . Syncope   . Thyroid nodule   . Type 2 diabetes  mellitus ([place])     Past Surgical History:  Procedure Laterality Date  . APPENDECTOMY     as child  . BACK SURGERY     2000 IDET SPINAL PROC  . CHOLECYSTECTOMY N/A 04/07/2016   Procedure: CHOLECYSTECTOMY;  Surgeon: Aviva Signs, MD;  Location: [facility];  Service: General;  Laterality: N/A;  . [place]  . CRYOTHERAPY    . EYE SURGERY     right cataract with lens implant  . FEMUR SURGERY     RT LEG        1953  . HAND SURGERY     RIGHT  . HERNIA REPAIR     1-right inguinal, 3- left inguinal  . IRRIGATION AND DEBRIDEMENT KNEE Left 07/09/2015   Procedure: LEFT KNEE IRRIGATION AND DEBRIDEMENT WOUND CLOSURE;  Surgeon: Gaynelle Arabian, MD;  [facility];  Service: Orthopedics;  Laterality: Left;  . JOINT REPLACEMENT  02/28/2016   lt knee revision  . KNEE DEBRIDEMENT     2016   LEFT  . MENISCUS REPAIR     LEFT  . SHOULDER OPEN ROTATOR CUFF REPAIR     RT SHOULDER  . SPINAL FUSION    . SPINAL FUSION    . TONSILLECTOMY    . TOTAL KNEE ARTHROPLASTY Left 05/28/2015   Procedure: LEFT TOTAL KNEE ARTHROPLASTY;  Surgeon: Gaynelle Arabian, MD;  [facility];  Service: Orthopedics;  Laterality:  Left;  . TOTAL KNEE REVISION Left 02/28/2016   Procedure: LEFT TOTAL KNEE REVISION;  Surgeon: Leandrew Koyanagi, MD;  Location: [facility];  Service: Orthopedics;  Laterality: Left;  . TRIGGER FINGER RELEASE     RT HAND   1990S  . UVULECTOMY  2004   TURBINATE,TONSIL,ADENOID    Current Outpatient Prescriptions  Medication Sig Dispense Refill  . aspirin EC 81 MG tablet Take 81 mg by mouth every morning.    [name] Kitchen atorvastatin (LIPITOR) 40 MG tablet Take 1 tablet (40 mg total) by mouth daily at 6 PM. 30 tablet 1  . Cholecalciferol (VITAMIN D3) 5000 units CAPS Take 5,000 Units by mouth at bedtime.    . clonazePAM (KLONOPIN) 1 MG tablet Take 1 tablet (1 mg total) by mouth at bedtime. (Patient taking differently: Take 0.5 mg by mouth at bedtime. ) 30 tablet 2  . diclofenac sodium  (VOLTAREN) 1 % GEL Apply 2 g topically daily as needed (for pain). 2 g 1  . escitalopram (LEXAPRO) 5 MG tablet Take 5 mg by mouth at bedtime.  1  . fluticasone (FLONASE) 50 MCG/ACT nasal spray Place 1 spray into both nostrils 2 (two) times daily as needed for allergies.   1  . predniSONE (DELTASONE) 5 MG tablet Take 5 mg by mouth 2 (two) times a week. Tuesday and Thursday  1  . pregabalin (LYRICA) 25 MG capsule Take 25 mg by mouth at bedtime.    . tamsulosin (FLOMAX) 0.4 MG CAPS capsule Take 0.4 mg by mouth at bedtime.    [name] Kitchen venlafaxine XR (EFFEXOR-XR) 75 MG 24 hr capsule Take 1 capsule by mouth daily.  0  . fludrocortisone (FLORINEF) 0.1 MG tablet Take 1 tablet (0.1 mg total) by mouth daily. 30 tablet 6   No current facility-administered medications for this visit.    Allergies:  Calan [verapamil]; Cymbalta [duloxetine hcl]; Iodinated diagnostic agents; Iodine; Latex; Lisinopril; Neurontin [gabapentin]; Zoloft [sertraline hcl]; Protonix [pantoprazole sodium]; Valium [diazepam]; Flomax [tamsulosin hcl]; Glimepiride; Melatonin; and Tape   Social History: The patient  reports that he quit smoking about 29 years ago. His smoking use included Cigarettes. He started smoking about 57 years ago. He smoked 0.10 packs per day. He has never used smokeless tobacco. He reports that he does not drink alcohol or use drugs.   ROS:  Please see the history of present illness. Otherwise, complete review of systems is positive for visual changes with cataracts.  All other systems are reviewed and negative.   Physical Exam: VS:  BP 137/63 (BP Location: Left Arm, Patient Position: Sitting, Cuff Size: Large)   Pulse 63   Ht 6\' 2"  (1.88 m)   Wt 207 lb (93.9 kg)   SpO2 95%   BMI 26.58 kg/m , BMI Body mass index is 26.58 kg/m.  Wt Readings from Last 3 Encounters:  08/01/16 207 lb (93.9 kg)  07/23/16 201 lb (91.2 kg)  07/19/16 204 lb (92.5 kg)    Elderly male, appears comfortable at rest. HEENT: Conjunctiva  and lids normal, oropharynx clear. Neck: Supple, no elevated JVP, no carotid bruits, no thyromegaly. Lungs: Clear to auscultation, nonlabored breathing at rest. No pleural rub. Cardiac: Regular rate and rhythm, no S3, 2/6 basal systolic murmur, no pericardial rub. Abdomen: Soft, nontender, bowel sounds present, no guarding or rebound. Extremities: Trace ankle edema, distal pulses 2+. Skin: Warm and dry. Musculoskeletal: No kyphosis. Neuropsychiatric: Alert and oriented 3, affect appropriate.  ECG: I personally reviewed the tracing from 07/16/2016 which showed  sinus rhythm with prolonged PR interval, left anterior fascicular block, and poor R wave progression.  Recent Labwork: 04/09/2016: Magnesium 2.1 07/01/2016: ALT 22; AST 24; BUN 25; Creatinine, Ser 1.02; Hemoglobin 13.5; Platelets 223; Potassium 4.3; Sodium 134     Component Value Date/Time   CHOL 181 12/20/2015 0705   TRIG 177 (H) 12/20/2015 0705   HDL 36 (L) 12/20/2015 0705   CHOLHDL 5.0 12/20/2015 0705   VLDL 35 12/20/2015 0705   LDLCALC 110 (H) 12/20/2015 0705    Other Studies Reviewed Today:  Echocardiogram 07/18/2016: Study Conclusions  - Left ventricle: The cavity size was normal. Wall thickness was   normal. Systolic function was vigorous. The estimated ejection   fraction was in the range of 65% to 70%. Doppler parameters are   consistent with abnormal left ventricular relaxation (grade 1   diastolic dysfunction). - Aortic valve: Moderately calcified annulus. Trileaflet;   moderately thickened leaflets. There was mild regurgitation. Mean   gradient (S): 11 mm Hg. Valve area (VTI): 2.08 cm^2. Valve area   (Vmax): 2.16 cm^2. Valve area (Vmean): 2.39 cm^2. - Mitral valve: Mildly calcified annulus. Mildly thickened leaflets   . There was mild regurgitation. - Atrial septum: No defect or patent foramen ovale was identified. - Technically difficult study.  Assessment and Plan:  1. Orthostatic intolerance, likely  autonomic dysfunction but also complicated by moderate vertebrobasilar insufficiency, and medications such as Flomax and Effexor. We are going to try Florinef 0.1 mg daily. If he could modify the other medications mentioned this may also help. I asked him to address this with Dr. Anastasio Champion. Graduated compression stockings may also be beneficial.  2. Echocardiogram results discussed today. He does not have any substantial valvular heart disease that would be explaining symptoms. Mild aortic regurgitation and mitral regurgitation noted. LVEF is normal at 65-70% with only mild diastolic dysfunction.  3. Previous history of stroke/TIA.  4. Hyperlipidemia, on Lipitor. He follows with Dr. Anastasio Champion.  Current medicines were reviewed with the patient today.  Disposition: Follow-up with me in 4-6 weeks.  Signed, [name], MD, [facility] 08/01/2016 12:06 PM    [place] [facility] [place], [place], [place] [address] Phone: [phone]; Fax: [phone]

## 2016-08-01 ENCOUNTER — Encounter: Payer: Self-pay | Admitting: Cardiology

## 2016-08-01 ENCOUNTER — Ambulatory Visit (INDEPENDENT_AMBULATORY_CARE_PROVIDER_SITE_OTHER): Payer: Medicare Other | Admitting: Cardiology

## 2016-08-01 VITALS — BP 137/63 | HR 63 | Ht 74.0 in | Wt 207.0 lb

## 2016-08-01 DIAGNOSIS — R42 Dizziness and giddiness: Secondary | ICD-10-CM

## 2016-08-01 DIAGNOSIS — E785 Hyperlipidemia, unspecified: Secondary | ICD-10-CM | POA: Diagnosis not present

## 2016-08-01 DIAGNOSIS — I351 Nonrheumatic aortic (valve) insufficiency: Secondary | ICD-10-CM | POA: Diagnosis not present

## 2016-08-01 DIAGNOSIS — I639 Cerebral infarction, unspecified: Secondary | ICD-10-CM

## 2016-08-01 DIAGNOSIS — G909 Disorder of the autonomic nervous system, unspecified: Secondary | ICD-10-CM

## 2016-08-01 MED ORDER — FLUDROCORTISONE ACETATE 0.1 MG PO TABS: 0.1000 mg | ORAL_TABLET | Freq: Every day | ORAL | 6 refills | Status: DC

## 2016-08-01 NOTE — Patient Instructions (Signed)
Medication Instructions:   Begin Florinef 0.1mg  daily.    Continue all other medications.    Labwork: none  Testing/Procedures: none  Follow-Up: 4-6 weeks   Any Other Special Instructions Will Be Listed Below (If Applicable).  If you need a refill on your cardiac medications before your next appointment, please call your pharmacy.

## 2016-08-02 NOTE — Procedures (Signed)
[place] A. Merlene Laughter, MD     [email or web address]             NOCTURNAL POLYSOMNOGRAPHY   LOCATION: [facility]   Patient Name: Jared Bridges, Jared Bridges Date: 07/23/2016 Gender: Male D.O.B: July 27, 1930 Age (years): 72 Referring Provider: Phillips Odor MD, ABSM Height (inches): 73 Interpreting Physician: Phillips Odor MD, ABSM Weight (lbs): 201 RPSGT: Peak, Robert BMI: 27 MRN: [id number] Neck Size: 17.00 CLINICAL INFORMATION Sleep Study Type: NPSG Indication for sleep study: N/A Epworth Sleepiness Score: 2 SLEEP STUDY TECHNIQUE As per the AASM Manual for the Scoring of Sleep and Associated Events v2.3 (April 2016) with a hypopnea requiring 4% desaturations. The channels recorded and monitored were frontal, central and occipital EEG, electrooculogram (EOG), submentalis EMG (chin), nasal and oral airflow, thoracic and abdominal wall motion, anterior tibialis EMG, snore microphone, electrocardiogram, and pulse oximetry. MEDICATIONS Patient's medications include: N/A. Medications self-administered by patient during sleep study : No sleep medicine administered.  Current Outpatient Prescriptions:  .  aspirin EC 81 MG tablet, Take 81 mg by mouth every morning., Disp: , Rfl:  .  atorvastatin (LIPITOR) 40 MG tablet, Take 1 tablet (40 mg total) by mouth daily at 6 PM., Disp: 30 tablet, Rfl: 1 .  Cholecalciferol (VITAMIN D3) 5000 units CAPS, Take 5,000 Units by mouth at bedtime., Disp: , Rfl:  .  clonazePAM (KLONOPIN) 1 MG tablet, Take 1 tablet (1 mg total) by mouth at bedtime. (Patient taking differently: Take 0.5 mg by mouth at bedtime. ), Disp: 30 tablet, Rfl: 2 .  diclofenac sodium (VOLTAREN) 1 % GEL, Apply 2 g topically daily as needed (for pain)., Disp: 2 g, Rfl: 1 .  escitalopram (LEXAPRO) 5 MG tablet, Take 5 mg by mouth at bedtime., Disp: , Rfl: 1 .  fludrocortisone (FLORINEF) 0.1 MG tablet, Take 1 tablet (0.1 mg total) by mouth daily., Disp: 30 tablet, Rfl: 6 .   fluticasone (FLONASE) 50 MCG/ACT nasal spray, Place 1 spray into both nostrils 2 (two) times daily as needed for allergies. , Disp: , Rfl: 1 .  predniSONE (DELTASONE) 5 MG tablet, Take 5 mg by mouth 2 (two) times a week. Tuesday and Thursday, Disp: , Rfl: 1 .  pregabalin (LYRICA) 25 MG capsule, Take 25 mg by mouth at bedtime., Disp: , Rfl:  .  tamsulosin (FLOMAX) 0.4 MG CAPS capsule, Take 0.4 mg by mouth at bedtime., Disp: , Rfl:  .  venlafaxine XR (EFFEXOR-XR) 75 MG 24 hr capsule, Take 1 capsule by mouth daily., Disp: , Rfl: 0  SLEEP ARCHITECTURE The study was initiated at 9:54:55 PM and ended at 5:01:16 AM. Sleep onset time was 40.8 minutes and the sleep efficiency was 55.7%. The total sleep time was 237.6 minutes. Stage REM latency was 232.0 minutes. The patient spent 22.94% of the night in stage N1 sleep, 50.12% in stage N2 sleep, 0.00% in stage N3 and 26.94% in REM. Alpha intrusion was absent. Supine sleep was 23.81%. RESPIRATORY PARAMETERS The overall apnea/hypopnea index (AHI) was 10.9 per hour. There were 17 total apneas, including 10 obstructive, 7 central and 0 mixed apneas. There were 26 hypopneas and 50 RERAs. The AHI during Stage REM sleep was 0.0 per hour. AHI while supine was 35.0 per hour. The mean oxygen saturation was 91.63%. The minimum SpO2 during sleep was 87.00%. snoring was noted during this study. CARDIAC DATA The 2 lead EKG demonstrated sinus rhythm. The mean heart rate was 65.65 beats per minute. Other EKG findings include: None. LEG MOVEMENT DATA The  total PLMS were 0 with a resulting PLMS index of 0.00. Associated arousal with leg movement index was 0.0. This was recorded by the computer but visually theree appears to be a moderate PLM.   IMPRESSIONS - Mild obstructive sleep apnea. Consider trial of auto Pap 8-14. - Abnormal sleep architecture with significantly reduced sleep efficiency and absent slow-wave sleep. - Moderatte periodic limb movement disorder of  sleep.  Delano Metz, MD Diplomate, American Board of Sleep Medicine.

## 2016-08-19 ENCOUNTER — Telehealth: Payer: Self-pay | Admitting: Cardiology

## 2016-08-19 NOTE — Telephone Encounter (Signed)
Swelling in hands & feet x 1 week.  Follow up scheduled 09/08/2016.  Spoke with Leda Gauze, she stated that [name] called previously, but had ran out to the store.  Not sure exactly what she needed to ask.

## 2016-08-28 ENCOUNTER — Telehealth: Payer: Self-pay | Admitting: Cardiology

## 2016-08-28 NOTE — Telephone Encounter (Signed)
Daughter and wife stopped by to discuss patient retaining fluid in his ankles. They are really swollen.

## 2016-08-29 ENCOUNTER — Ambulatory Visit (INDEPENDENT_AMBULATORY_CARE_PROVIDER_SITE_OTHER): Payer: Medicare Other | Admitting: Cardiology

## 2016-08-29 VITALS — BP 155/68 | HR 60 | Ht 74.0 in | Wt 213.0 lb

## 2016-08-29 DIAGNOSIS — I639 Cerebral infarction, unspecified: Secondary | ICD-10-CM | POA: Diagnosis not present

## 2016-08-29 DIAGNOSIS — I951 Orthostatic hypotension: Secondary | ICD-10-CM

## 2016-08-29 DIAGNOSIS — R600 Localized edema: Secondary | ICD-10-CM

## 2016-08-29 MED ORDER — FLUDROCORTISONE ACETATE 0.1 MG PO TABS: 0.1000 mg | ORAL_TABLET | Freq: Every day | ORAL | 6 refills | Status: DC

## 2016-08-29 NOTE — Telephone Encounter (Signed)
Patient has OV scheduled for today with Dr. Harl Bowie.

## 2016-08-29 NOTE — Telephone Encounter (Signed)
Yesterday (9:22 AM)   Daughter and wife stopped by to discuss patient retaining fluid in his ankles. They are really swollen.   Documentation   Alyson Locket. Osen [id number] contacted [place] (9:22 AM)

## 2016-08-29 NOTE — Progress Notes (Signed)
Clinical Summary Jared Bridges is a 82 y.o.male regular patient of Dr Domenic Polite, he is seen as an add on patient per patient request for the following medical problems.   1. Orthostatic hypotension - long history of orthostatic dizziness. From notes also thought to be a component of verterbrobasilar insufficiency contributing to dizziness as well - started on florinef 0.1mg  daily on 08/01/16.Dose was later increased to 0.2mg  daily during f/u appt with Dr Arsenio Katz.  - Since that time developed significant swelling in ankles.    Past Medical History:  Diagnosis Date  . Anemia, iron deficiency   . Chronic headaches    Migraines  . Chronic kidney disease    Resolved  . Depression   . Diverticulitis   . Essential hypertension   . GI bleed    Dr. Laural Golden - 1998  . Hypercholesteremia   . Orthostatic hypotension   . Peptic ulcer disease   . Peripheral neuropathy ([place])   . Polymyalgia rheumatica ([place])   . Stroke [facility])    Residual trouble reading and writing  . Syncope   . Thyroid nodule   . Type 2 diabetes mellitus ([name]      Allergies  Allergen Reactions  . Calan [Verapamil] Other (See Comments)    Weakness  . Cymbalta [Duloxetine Hcl] Other (See Comments)    Makes patient have jerking motions.  . Iodinated Diagnostic Agents Hives and Itching    Can pre-med with benadryl  . Iodine Hives and Itching    very allergic(per daughter), can pre-med with benadryl  . Latex Other (See Comments)    Redness iritation  . Lisinopril Other (See Comments)    Makes patient have jerking motions.  . Neurontin [Gabapentin] Other (See Comments)    Makes patient have jerking motions.  [name] Kitchen Zoloft [Sertraline Hcl] Diarrhea and Other (See Comments)    Makes patient have jerking motions.  . Protonix [Pantoprazole Sodium] Other (See Comments)    "Did not work"  . Valium [Diazepam] Other (See Comments)    "Did not work"  . Flomax [Tamsulosin Hcl] Other (See Comments)    Dizziness  . Glimepiride  Other (See Comments)    "Did not work"  . Melatonin Nausea Only  . Tape Other (See Comments)    Skin irritation     Current Outpatient Prescriptions  Medication Sig Dispense Refill  . aspirin EC 81 MG tablet Take 81 mg by mouth every morning.    [name] Kitchen atorvastatin (LIPITOR) 40 MG tablet Take 1 tablet (40 mg total) by mouth daily at 6 PM. 30 tablet 1  . Cholecalciferol (VITAMIN D3) 5000 units CAPS Take 5,000 Units by mouth at bedtime.    . clonazePAM (KLONOPIN) 1 MG tablet Take 1 tablet (1 mg total) by mouth at bedtime. (Patient taking differently: Take 0.5 mg by mouth at bedtime. ) 30 tablet 2  . diclofenac sodium (VOLTAREN) 1 % GEL Apply 2 g topically daily as needed (for pain). 2 g 1  . escitalopram (LEXAPRO) 5 MG tablet Take 5 mg by mouth at bedtime.  1  . fludrocortisone (FLORINEF) 0.1 MG tablet Take 1 tablet (0.1 mg total) by mouth daily. 30 tablet 6  . fluticasone (FLONASE) 50 MCG/ACT nasal spray Place 1 spray into both nostrils 2 (two) times daily as needed for allergies.   1  . predniSONE (DELTASONE) 5 MG tablet Take 5 mg by mouth 2 (two) times a week. Tuesday and Thursday  1  . pregabalin (LYRICA) 25 MG capsule  Take 25 mg by mouth at bedtime.    . tamsulosin (FLOMAX) 0.4 MG CAPS capsule Take 0.4 mg by mouth at bedtime.    [name] Kitchen venlafaxine XR (EFFEXOR-XR) 75 MG 24 hr capsule Take 1 capsule by mouth daily.  0   No current facility-administered medications for this visit.      Past Surgical History:  Procedure Laterality Date  . APPENDECTOMY     as child  . BACK SURGERY     2000 IDET SPINAL PROC  . CHOLECYSTECTOMY N/A 04/07/2016   Procedure: CHOLECYSTECTOMY;  Surgeon: Aviva Signs, MD;  Location: [facility];  Service: General;  Laterality: N/A;  . [place]  . CRYOTHERAPY    . EYE SURGERY     right cataract with lens implant  . FEMUR SURGERY     RT LEG        1953  . HAND SURGERY     RIGHT  . HERNIA REPAIR     1-right inguinal, 3- left inguinal    . IRRIGATION AND DEBRIDEMENT KNEE Left 07/09/2015   Procedure: LEFT KNEE IRRIGATION AND DEBRIDEMENT WOUND CLOSURE;  Surgeon: Gaynelle Arabian, MD;  Location: [facility];  Service: Orthopedics;  Laterality: Left;  . JOINT REPLACEMENT  02/28/2016   lt knee revision  . KNEE DEBRIDEMENT     2016   LEFT  . MENISCUS REPAIR     LEFT  . SHOULDER OPEN ROTATOR CUFF REPAIR     RT SHOULDER  . SPINAL FUSION    . SPINAL FUSION    . TONSILLECTOMY    . TOTAL KNEE ARTHROPLASTY Left 05/28/2015   Procedure: LEFT TOTAL KNEE ARTHROPLASTY;  Surgeon: Gaynelle Arabian, MD;  Location: [facility];  Service: Orthopedics;  Laterality: Left;  . TOTAL KNEE REVISION Left 02/28/2016   Procedure: LEFT TOTAL KNEE REVISION;  Surgeon: Leandrew Koyanagi, MD;  Location: [facility];  Service: Orthopedics;  Laterality: Left;  . TRIGGER FINGER RELEASE     RT HAND   1990S  . UVULECTOMY  2004   TURBINATE,TONSIL,ADENOID     Allergies  Allergen Reactions  . Calan [Verapamil] Other (See Comments)    Weakness  . Cymbalta [Duloxetine Hcl] Other (See Comments)    Makes patient have jerking motions.  . Iodinated Diagnostic Agents Hives and Itching    Can pre-med with benadryl  . Iodine Hives and Itching    very allergic(per daughter), can pre-med with benadryl  . Latex Other (See Comments)    Redness iritation  . Lisinopril Other (See Comments)    Makes patient have jerking motions.  . Neurontin [Gabapentin] Other (See Comments)    Makes patient have jerking motions.  [name] Kitchen Zoloft [Sertraline Hcl] Diarrhea and Other (See Comments)    Makes patient have jerking motions.  . Protonix [Pantoprazole Sodium] Other (See Comments)    "Did not work"  . Valium [Diazepam] Other (See Comments)    "Did not work"  . Flomax [Tamsulosin Hcl] Other (See Comments)    Dizziness  . Glimepiride Other (See Comments)    "Did not work"  . Melatonin Nausea Only  . Tape Other (See Comments)    Skin irritation      Family History  Problem Relation Age of Onset  .  Colon cancer Sister   . Hypertension    . Depression Brother   . Depression Daughter      Social History Jared Bridges reports that he quit smoking about 95  years ago. His smoking use included Cigarettes. He started smoking about 57 years ago. He smoked 0.10 packs per day. He has never used smokeless tobacco. Jared Bridges reports that he does not drink alcohol.   Review of Systems CONSTITUTIONAL: No weight loss, fever, chills, weakness or fatigue.  HEENT: Eyes: No visual loss, blurred vision, double vision or yellow sclerae.No hearing loss, sneezing, congestion, runny nose or sore throat.  SKIN: No rash or itching.  CARDIOVASCULAR: no chest pain, no palpitaitons RESPIRATORY: No shortness of breath, cough or sputum.  GASTROINTESTINAL: No anorexia, nausea, vomiting or diarrhea. No abdominal pain or blood.  GENITOURINARY: No burning on urination, no polyuria NEUROLOGICAL: occas dizziness MUSCULOSKELETAL: No muscle, back pain, joint pain or stiffness.  LYMPHATICS: No enlarged nodes. No history of splenectomy.  PSYCHIATRIC: No history of depression or anxiety.  ENDOCRINOLOGIC: No reports of sweating, cold or heat intolerance. No polyuria or polydipsia.  [name] Kitchen   Physical Examination Vitals:   08/29/16 1514  BP: (!) 155/68  Pulse: 60   Vitals:   08/29/16 1514  Weight: 213 lb (96.6 kg)  Height: 6\' 2"  (1.88 m)    Gen: resting comfortably, no acute distress HEENT: no scleral icterus, pupils equal round and reactive, no palptable cervical adenopathy,  CV: RRR, no m/r/g, no jvd Resp: Clear to auscultation bilaterally GI: abdomen is soft, non-tender, non-distended, normal bowel sounds, no hepatosplenomegaly MSK: extremities are warm, trace bilateral LE edema Skin: warm, no rash Neuro:  no focal deficits Psych: appropriate affect     Assessment and Plan  1. LE edema - due to recent increase in florinef dosing - we will lower dose back to 0.1mg  daily, which patient seemed to  tolerate  2. Orthostatic hypotension - follow symptoms on florinef at 0.1mg . Continue aggressive hydration - defer consideration for additional medical therapy to Dr Domenic Polite.      Arnoldo Lenis, M.D.

## 2016-08-29 NOTE — Patient Instructions (Signed)
Your physician recommends that you schedule a follow-up appointment WITH DR. MCDOWELL AS SCHEDULED 09/08/16   Your physician has recommended you make the following change in your medication:   CHANGE FLORINEF 0.1 MG DAILY  Thank you for choosing [facility]!!

## 2016-08-31 ENCOUNTER — Encounter: Payer: Self-pay | Admitting: Cardiology

## 2016-09-01 ENCOUNTER — Telehealth: Payer: Self-pay | Admitting: *Deleted

## 2016-09-01 NOTE — Telephone Encounter (Signed)
Pt says he is feeling better -  not feeling dizzy when he stands up and less swelling in legs. 10/9 appt with Dr Domenic Polite - pt appreciative of call

## 2016-09-01 NOTE — Telephone Encounter (Signed)
-----   Message from [name], MD sent at 08/29/2016  3:53 PM EDT ----- Thank you for seeing him Jared Bridges. I would agree with continuing Florinef at 0.1 mg for now. If he was doing well with that alone, probably no new additions to his medications. Compression stockings still remain an option. I will forward this to nursing that they can contact the patient.  ----- Message ----- From: Arnoldo Lenis, MD Sent: 08/29/2016   3:40 PM To: [name], MD  Jen Mow,  Saw this patient as an add on in my clinic for LE edema. Looks like after you started florinef 0.1mg  daily he had f/u with Dr Arsenio Katz and it was increased to 0.2mg  daily. After that started developing bilateral LE edema which he was very concerned about. I assured him this was a common side effect, and decreased his florinef back to 0.1mg  daily which he seemed to have tolerated previously. I did not start midodrine, was going to defer to you, he has had some issues with bph and urinary retention in the past. Very nice family, I told them you would have someone contact them probably Monday with any further recs. Sounds like he is more open to trying the graduated compression stockings you had discussed at your last visit.   Zandra Abts MD

## 2016-09-05 ENCOUNTER — Encounter: Payer: Self-pay | Admitting: *Deleted

## 2016-09-07 NOTE — Progress Notes (Signed)
Cardiology Office Note  Date: 09/08/2016   ID: Jared Bridges, DOB 1930-06-28, MRN [id number]  PCP: Doree Albee, MD  Primary Cardiologist: Rozann Lesches, MD   Chief Complaint  Patient presents with  . Follow-up orthostasis    History of Present Illness: Jared Bridges is an 82 y.o. male that I last saw in September, interval visit with Dr. Harl Bowie also noted. He is here today for a follow-up visit. We tried Florinef 0.1 mg at the last visit, this was increased by Dr. Merlene Laughter and the patient states that he started to have trouble with a feeling of heart pounding in the evening and also leg swelling. He cut back the medication to the original dose and ultimately stopped it. He states he feels better now. In terms of his orthostasis, he has not described any recent dizziness, has been on CPAP which is a new treatment over the last week. No syncope.  Past Medical History:  Diagnosis Date  . Anemia, iron deficiency   . Chronic headaches    Migraines  . Chronic kidney disease    Resolved  . Depression   . Diverticulitis   . Essential hypertension   . GI bleed    Dr. Laural Golden - 1998  . Hypercholesteremia   . Orthostatic hypotension   . Peptic ulcer disease   . Peripheral neuropathy ([place])   . Polymyalgia rheumatica ([place])   . Stroke [facility])    Residual trouble reading and writing  . Syncope   . Thyroid nodule   . Type 2 diabetes mellitus ([place])     Current Outpatient Prescriptions  Medication Sig Dispense Refill  . aspirin EC 81 MG tablet Take 81 mg by mouth every morning.    [name] Kitchen atorvastatin (LIPITOR) 40 MG tablet Take 1 tablet (40 mg total) by mouth daily at 6 PM. 30 tablet 1  . Cholecalciferol (VITAMIN D3) 5000 units CAPS Take 5,000 Units by mouth at bedtime.    . clobetasol (TEMOVATE) 0.05 % external solution Apply 1 application topically 2 (two) times daily as needed.  2  . clonazePAM (KLONOPIN) 1 MG tablet Take 1 tablet (1 mg total) by mouth at bedtime. (Patient taking  differently: Take 0.5 mg by mouth at bedtime. ) 30 tablet 2  . diclofenac sodium (VOLTAREN) 1 % GEL Apply 2 g topically daily as needed (for pain). 2 g 1  . escitalopram (LEXAPRO) 5 MG tablet Take 5 mg by mouth at bedtime.  1  . fluticasone (FLONASE) 50 MCG/ACT nasal spray Place 1 spray into both nostrils 2 (two) times daily as needed for allergies.   1  . hydrocortisone 2.5 % cream Apply 1 application topically as needed.  3  . ibuprofen (ADVIL,MOTRIN) 800 MG tablet Take 800 mg by mouth 3 (three) times daily with meals.  0  . predniSONE (DELTASONE) 5 MG tablet Take 5 mg by mouth 2 (two) times a week. Tuesday and Thursday  1  . pregabalin (LYRICA) 25 MG capsule Take 25 mg by mouth at bedtime.    . tamsulosin (FLOMAX) 0.4 MG CAPS capsule Take 0.4 mg by mouth at bedtime.    [name] Kitchen venlafaxine XR (EFFEXOR-XR) 37.5 MG 24 hr capsule Take 1 capsule by mouth daily.  3  . zolpidem (AMBIEN) 5 MG tablet Take 1-2 mg by mouth at bedtime. Takes only if he doesn't take clonazepam  1   No current facility-administered medications for this visit.    Allergies:  Calan [verapamil]; Cymbalta [duloxetine hcl]; Iodinated  diagnostic agents; Iodine; Latex; Lisinopril; Neurontin [gabapentin]; Zoloft [sertraline hcl]; Protonix [pantoprazole sodium]; Valium [diazepam]; Flomax [tamsulosin hcl]; Glimepiride; Melatonin; and Tape   Social History: The patient  reports that he quit smoking about 29 years ago. His smoking use included Cigarettes. He started smoking about 57 years ago. He has a 1.80 pack-year smoking history. He has never used smokeless tobacco. He reports that he does not drink alcohol or use drugs.   ROS:  Please see the history of present illness. Otherwise, complete review of systems is positive for neuropathy, arthritis.  All other systems are reviewed and negative.   Physical Exam: VS:  BP (!) 167/78   Pulse 67   Ht 6\' 1"  (1.854 m)   Wt 210 lb (95.3 kg)   BMI 27.71 kg/m , BMI Body mass index is 27.71  kg/m.  Wt Readings from Last 3 Encounters:  09/08/16 210 lb (95.3 kg)  08/29/16 213 lb (96.6 kg)  08/01/16 207 lb (93.9 kg)    Elderly male, appears comfortable at rest. Using a cane. HEENT: Conjunctiva and lids normal, oropharynx clear. Neck: Supple, no elevated JVP, no carotid bruits, no thyromegaly. Lungs: Clear to auscultation, nonlabored breathing at rest. No pleural rub. Cardiac: Regular rate and rhythm, no S3, 2/6 basal systolic murmur, no pericardial rub. Abdomen: Soft, nontender, bowel sounds present, no guarding or rebound. Extremities: Trace ankle edema, distal pulses 2+.  ECG: I personally reviewed the tracing from 07/16/2016 which showed sinus rhythm with prolonged PR interval, left anterior fascicular block, and poor R wave progression.  Recent Labwork: 04/09/2016: Magnesium 2.1 07/01/2016: ALT 22; AST 24; BUN 25; Creatinine, Ser 1.02; Hemoglobin 13.5; Platelets 223; Potassium 4.3; Sodium 134     Component Value Date/Time   CHOL 181 12/20/2015 0705   TRIG 177 (H) 12/20/2015 0705   HDL 36 (L) 12/20/2015 0705   CHOLHDL 5.0 12/20/2015 0705   VLDL 35 12/20/2015 0705   LDLCALC 110 (H) 12/20/2015 0705    Other Studies Reviewed Today:  Echocardiogram 07/18/2016: Study Conclusions  - Left ventricle: The cavity size was normal. Wall thickness was normal. Systolic function was vigorous. The estimated ejection fraction was in the range of 65% to 70%. Doppler parameters are consistent with abnormal left ventricular relaxation (grade 1 diastolic dysfunction). - Aortic valve: Moderately calcified annulus. Trileaflet; moderately thickened leaflets. There was mild regurgitation. Mean gradient (S): 11 mm Hg. Valve area (VTI): 2.08 cm^2. Valve area (Vmax): 2.16 cm^2. Valve area (Vmean): 2.39 cm^2. - Mitral valve: Mildly calcified annulus. Mildly thickened leaflets . There was mild regurgitation. - Atrial septum: No defect or patent foramen ovale was  identified. - Technically difficult study.  Assessment and Plan:  1. History of orthostatic intolerance, suspect autonomic dysfunction complicated by moderate vertebrobasilar insufficiency and medications. She does not sound like he tolerated Florinef very well. He is feeling better recently, if symptoms worsen would suggest using graduated compression stockings.  2. Hyperlipidemia, on Lipitor.  Current medicines were reviewed with the patient today.  Disposition: Follow-up with me in 3 months.  Signed, [name], MD, [facility] 09/08/2016 2:46 PM    [place] [facility] [place], [place], [place] [address] Phone: [phone]; Fax: [phone]

## 2016-09-08 ENCOUNTER — Encounter: Payer: Self-pay | Admitting: Cardiology

## 2016-09-08 ENCOUNTER — Ambulatory Visit (INDEPENDENT_AMBULATORY_CARE_PROVIDER_SITE_OTHER): Payer: Medicare Other | Admitting: Cardiology

## 2016-09-08 VITALS — BP 167/78 | HR 67 | Ht 73.0 in | Wt 210.0 lb

## 2016-09-08 DIAGNOSIS — Z23 Encounter for immunization: Secondary | ICD-10-CM

## 2016-09-08 DIAGNOSIS — E782 Mixed hyperlipidemia: Secondary | ICD-10-CM | POA: Diagnosis not present

## 2016-09-08 DIAGNOSIS — I951 Orthostatic hypotension: Secondary | ICD-10-CM | POA: Diagnosis not present

## 2016-09-08 DIAGNOSIS — I639 Cerebral infarction, unspecified: Secondary | ICD-10-CM | POA: Diagnosis not present

## 2016-09-08 NOTE — Patient Instructions (Signed)
Your physician recommends that you schedule a follow-up appointment in: 3 MONTHS WITH DR. MCDOWELL  Your physician recommends that you continue on your current medications as directed. Please refer to the Current Medication list given to you today.  Thank you for choosing [facility]!!    

## 2016-09-09 ENCOUNTER — Ambulatory Visit (INDEPENDENT_AMBULATORY_CARE_PROVIDER_SITE_OTHER): Payer: Medicare Other | Admitting: Urology

## 2016-09-09 DIAGNOSIS — R311 Benign essential microscopic hematuria: Secondary | ICD-10-CM | POA: Diagnosis not present

## 2016-09-09 DIAGNOSIS — N509 Disorder of male genital organs, unspecified: Secondary | ICD-10-CM

## 2016-09-09 DIAGNOSIS — N5201 Erectile dysfunction due to arterial insufficiency: Secondary | ICD-10-CM | POA: Diagnosis not present

## 2016-09-09 DIAGNOSIS — N3000 Acute cystitis without hematuria: Secondary | ICD-10-CM

## 2016-09-22 ENCOUNTER — Ambulatory Visit (INDEPENDENT_AMBULATORY_CARE_PROVIDER_SITE_OTHER): Payer: Medicare Other | Admitting: Orthopaedic Surgery

## 2016-10-27 ENCOUNTER — Ambulatory Visit (INDEPENDENT_AMBULATORY_CARE_PROVIDER_SITE_OTHER): Payer: Medicare Other | Admitting: Orthopaedic Surgery

## 2016-10-27 ENCOUNTER — Ambulatory Visit (INDEPENDENT_AMBULATORY_CARE_PROVIDER_SITE_OTHER): Payer: Medicare Other

## 2016-10-27 DIAGNOSIS — Z96652 Presence of left artificial knee joint: Secondary | ICD-10-CM

## 2016-10-27 DIAGNOSIS — I639 Cerebral infarction, unspecified: Secondary | ICD-10-CM

## 2016-10-27 MED ORDER — LIDOCAINE HCL 1 % IJ SOLN
2.0000 mL | INTRAMUSCULAR | Status: AC | PRN
  Administered 2016-10-27: 2 mL

## 2016-10-27 MED ORDER — BUPIVACAINE HCL 0.5 % IJ SOLN
2.0000 mL | INTRAMUSCULAR | Status: AC | PRN
  Administered 2016-10-27: 2 mL via INTRA_ARTICULAR

## 2016-10-27 MED ORDER — METHYLPREDNISOLONE ACETATE 40 MG/ML IJ SUSP
40.0000 mg | INTRAMUSCULAR | Status: AC | PRN
  Administered 2016-10-27: 40 mg via INTRA_ARTICULAR

## 2016-10-27 NOTE — Progress Notes (Addendum)
Office Visit Note   Patient: Jared Bridges           Date of Birth: January 17, 1930           MRN: [id number] Visit Date: 10/27/2016              Requested by: Doree Albee, MD [address] [place], [place] [address] PCP: Doree Albee, MD   Assessment & Plan: Visit Diagnoses:  1. Status post total left knee replacement     Plan: I reviewed x-rays with the patient and his daughter and I do not see any signs of loosening or any complications to explain his pain. I think his pain is related to has pes bursitis. We did perform an injection today into the pes tendons.  We did set him up with home health physical therapy to help with quadriceps strengthening. He's been through a lot of surgeries and at his age he is gotten quite deconditioned. I hope that he is able to build his strength back up. I'll see him back in 6 months with repeat 2 view x-rays of the left knee. Questions encouraged and answered today.  Follow-Up Instructions: Return in about 6 months (around 04/26/2017).   Orders:  Orders Placed This Encounter  Procedures  . XR Knee 1-2 Views Left   No orders of the defined types were placed in this encounter.     Procedures: Large Joint Inj Date/Time: 10/27/2016 12:50 PM Performed by: Leandrew Koyanagi Authorized by: Leandrew Koyanagi   Consent Given by:  Patient Timeout: prior to procedure the correct patient, procedure, and site was verified   Indications:  Pain Location:  Knee Site:  L knee Prep: patient was prepped and draped in usual sterile fashion   Needle Size:  22 G Approach:  Medial Ultrasound Guidance: No   Fluoroscopic Guidance: No   Arthrogram: No   Medications:  2 mL lidocaine 1 %; 2 mL bupivacaine 0.5 %; 40 mg methylPREDNISolone acetate 40 MG/ML     Clinical Data: No additional findings.   Subjective: No chief complaint on file.   HPI The patient is approximately 8 months status post left total knee revision for loosening of the tibia. He is  overall doing well but complains of weakness and going upstairs mainly from the operative leg. He also endorses some medial sided tibial pain. He does have baseline neuropathy. He denies any constitutional symptoms or radiation of pain denies any numbness Review of Systems Negative except for history of present illness  Objective: Vital Signs: There were no vitals taken for this visit.  Physical Exam Well-developed well-nourished no acute distress alert and oriented 3 Ortho Exam Exam of the left knee shows a well-healed surgical scar. There is dark discoloration of the skin from previous skin blisters with his original surgery. He does have tenderness of the has tendons and bursa. I do not appreciate any effusion. He has excellent range of motion.. The collaterals are intact. Patellar tracking is normal. He does have obvious quadriceps atrophy. Specialty Comments:  No specialty comments available.  Imaging: Xr Knee 1-2 Views Left  Result Date: 10/27/2016 Stable left total knee replacement without any signs of loosening or complications.    PMFS History: Patient Active Problem List   Diagnosis Date Noted  . Acute cholecystitis 04/05/2016  . 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 Total knee replacement status 02/28/2016  . CVA (cerebral infarction) 12/19/2015  . Acute CVA (cerebrovascular accident) ([place]) 12/19/2015  .  Carotid stenosis 12/19/2015  . Postoperative wound dehiscence 07/09/2015  . Type 2 diabetes mellitus, uncontrolled ([place]) 06/29/2015  . Cellulitis of knee, left 06/29/2015  . Wound infection after surgery 06/29/2015  . OA (osteoarthritis) of knee 05/28/2015  . BRBPR (bright red blood per rectum) 11/21/2014  . Orthostatic hypotension 11/21/2014  . Dizziness 11/16/2014  . Polymyalgia rheumatica ([place]) 09/12/2014  . Anemia, iron deficiency 08/16/2014  . DJD (degenerative joint disease) 08/16/2014  . Essential hypertension   . Type 2 diabetes  mellitus ([place])   . Cataract, nuclear 04/11/2014  . Complaining of back-related symptom 03/15/2014  . Arthralgia of hip or thigh 03/15/2014  . Gonalgia 10/10/2013  . 1st degree AV block 08/04/2013  . Infection of the upper respiratory tract 04/05/2013  . Cataract 02/24/2013  . Pseudoaphakia 02/24/2013  . Episode of syncope 01/11/2013  . Cardiac murmur 11/09/2012  . Near syncope 11/09/2012  . Anxiety 06/18/2012  . Clinical depression 06/18/2012  . Bleeding gastrointestinal 06/18/2012  . Diverticulitis 06/18/2012  . Difficulty hearing 06/18/2012  . Adaptive colitis 06/18/2012  . Arthritis, degenerative 06/18/2012  . Disease of thyroid gland 06/18/2012  . Multinodular goiter 01/29/2012  . 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 Adenoma of large intestine 05/31/2011  . Cephalalgia 05/31/2011  . Hypercholesterolemia 05/31/2011  . Benign fibroma of prostate 05/31/2011  . LBP (low back pain) 05/31/2011  . Diabetes mellitus ([place]) 05/31/2011  . Essential (primary) hypertension 05/31/2011   Past Medical History:  Diagnosis Date  . Anemia, iron deficiency   . Chronic headaches    Migraines  . Chronic kidney disease    Resolved  . Depression   . Diverticulitis   . Essential hypertension   . GI bleed    Dr. Laural Golden - 1998  . Hypercholesteremia   . Orthostatic hypotension   . Peptic ulcer disease   . Peripheral neuropathy ([place])   . Polymyalgia rheumatica ([place])   . Stroke [facility])    Residual trouble reading and writing  . Syncope   . Thyroid nodule   . Type 2 diabetes mellitus ([name]     Family History  Problem Relation Age of Onset  . Colon cancer Sister   . Hypertension    . Depression Brother   . Depression Daughter     Past Surgical History:  Procedure Laterality Date  . APPENDECTOMY     as child  . BACK SURGERY     2000 IDET SPINAL PROC  . CHOLECYSTECTOMY N/A 04/07/2016   Procedure: CHOLECYSTECTOMY;  Surgeon: Aviva Signs, MD;  Location: [facility];  Service: General;  Laterality:  N/A;  . [place]  . CRYOTHERAPY    . EYE SURGERY     right cataract with lens implant  . FEMUR SURGERY     RT LEG        1953  . HAND SURGERY     RIGHT  . HERNIA REPAIR     1-right inguinal, 3- left inguinal  . IRRIGATION AND DEBRIDEMENT KNEE Left 07/09/2015   Procedure: LEFT KNEE IRRIGATION AND DEBRIDEMENT WOUND CLOSURE;  Surgeon: Gaynelle Arabian, MD;  Location: [facility];  Service: Orthopedics;  Laterality: Left;  . JOINT REPLACEMENT  02/28/2016   lt knee revision  . KNEE DEBRIDEMENT     2016   LEFT  . MENISCUS REPAIR     LEFT  . SHOULDER OPEN ROTATOR CUFF REPAIR     RT SHOULDER  . SPINAL FUSION    .  SPINAL FUSION    . TONSILLECTOMY    . TOTAL KNEE ARTHROPLASTY Left 05/28/2015   Procedure: LEFT TOTAL KNEE ARTHROPLASTY;  Surgeon: Gaynelle Arabian, MD;  Location: [facility];  Service: Orthopedics;  Laterality: Left;  . TOTAL KNEE REVISION Left 02/28/2016   Procedure: LEFT TOTAL KNEE REVISION;  Surgeon: Leandrew Koyanagi, MD;  Location: [facility];  Service: Orthopedics;  Laterality: Left;  . TRIGGER FINGER RELEASE     RT HAND   1990S  . UVULECTOMY  2004   TURBINATE,TONSIL,ADENOID   Social History   Occupational History  . Not on file.   Social History Main Topics  . Smoking status: Former Smoker    Packs/day: 0.10    Years: 18.00    Types: Cigarettes    Start date: 12/18/1958    Quit date: 12/01/1986  . Smokeless tobacco: Never Used  . Alcohol use No  . Drug use: No  . Sexual activity: No

## 2016-10-31 ENCOUNTER — Telehealth (INDEPENDENT_AMBULATORY_CARE_PROVIDER_SITE_OTHER): Payer: Self-pay | Admitting: Orthopaedic Surgery

## 2016-10-31 NOTE — Telephone Encounter (Signed)
Please advise 

## 2016-10-31 NOTE — Telephone Encounter (Signed)
yees

## 2016-10-31 NOTE — Telephone Encounter (Signed)
LMOM order were approved

## 2016-10-31 NOTE — Telephone Encounter (Signed)
[facility] CALLED ABOUT PATIENT STARTED HOME THERAPY YESTERDAY , SHE NEEDS A VERBAL ORDER FOR ONCE A WK FOR 1 WK  TWICE A WEEK FOR 4 WEEKS FOCUSING ON QUAD STRENGTH  [phone]

## 2016-12-05 ENCOUNTER — Other Ambulatory Visit: Payer: Self-pay | Admitting: Neurology

## 2016-12-10 ENCOUNTER — Encounter: Payer: Self-pay | Admitting: *Deleted

## 2016-12-10 NOTE — Progress Notes (Signed)
Cardiology Office Note  Date: 12/11/2016   ID: Jared Bridges, DOB 1930/10/09, MRN [id number]  PCP: Jared Albee, MD  Primary Cardiologist: Jared Lesches, MD   Chief Complaint  Patient presents with  . Follow-up orthostatic intolerance    History of Present Illness: Jared Bridges is an 83 y.o. male last seen in October 2017. Records indicate recent visit with Dr. Anastasio Bridges at which time Lyrica was reduced to 50 mg daily and he was also started back on Florinef at 0.1 mg daily related to orthostatic intolerance. He tells me that since that time he has done very well, not reporting any significant orthostatic dizziness at this point.  I reviewed his medications and we discussed the recent changes with which I agree.  Past Medical History:  Diagnosis Date  . Anemia, iron deficiency   . Chronic headaches    Migraines  . Chronic kidney disease    Resolved  . Depression   . Diverticulitis   . Essential hypertension   . GI bleed    Dr. Laural Bridges - 1998  . Hypercholesteremia   . Orthostatic hypotension   . Peptic ulcer disease   . Peripheral neuropathy ([place])   . Polymyalgia rheumatica ([place])   . Stroke [facility])    Residual trouble reading and writing  . Syncope   . Thyroid nodule   . Type 2 diabetes mellitus ([place])     Current Outpatient Prescriptions  Medication Sig Dispense Refill  . aspirin EC 81 MG tablet Take 81 mg by mouth every morning.    [name] Kitchen atorvastatin (LIPITOR) 40 MG tablet Take 1 tablet (40 mg total) by mouth daily at 6 PM. 30 tablet 1  . Cholecalciferol (VITAMIN D3) 5000 units CAPS Take 5,000 Units by mouth at bedtime.    . clobetasol (TEMOVATE) 0.05 % external solution Apply 1 application topically 2 (two) times daily as needed.  2  . diclofenac sodium (VOLTAREN) 1 % GEL Apply 2 g topically daily as needed (for pain). 2 g 1  . escitalopram (LEXAPRO) 5 MG tablet Take 5 mg by mouth at bedtime.  1  . fludrocortisone (FLORINEF) 0.1 MG tablet Take 0.1 mg by mouth  daily.    . fluticasone (FLONASE) 50 MCG/ACT nasal spray Place 1 spray into both nostrils 2 (two) times daily as needed for allergies.   1  . hydrocortisone 2.5 % cream Apply 1 application topically as needed.  3  . ibuprofen (ADVIL,MOTRIN) 800 MG tablet Take 800 mg by mouth 3 (three) times daily with meals.  0  . predniSONE (DELTASONE) 5 MG tablet Take 5 mg by mouth 2 (two) times a week. Tuesday and Thursday  1  . pregabalin (LYRICA) 50 MG capsule Take 50 mg by mouth daily.    [name] Kitchen venlafaxine XR (EFFEXOR-XR) 37.5 MG 24 hr capsule Take 1 capsule by mouth daily.  3  . zolpidem (AMBIEN) 5 MG tablet Take 1-2 mg by mouth at bedtime. Takes only if he doesn't take clonazepam  1   No current facility-administered medications for this visit.    Allergies:  Calan [verapamil]; Cymbalta [duloxetine hcl]; Iodinated diagnostic agents; Iodine; Latex; Lisinopril; Neurontin [gabapentin]; Zoloft [sertraline hcl]; Protonix [pantoprazole sodium]; Valium [diazepam]; Flomax [tamsulosin hcl]; Glimepiride; Melatonin; and Tape   Social History: The patient  reports that he quit smoking about 30 years ago. His smoking use included Cigarettes. He started smoking about 58 years ago. He has a 1.80 pack-year smoking history. He has never used smokeless tobacco. He  reports that he does not drink alcohol or use drugs.   ROS:  Please see the history of present illness. Otherwise, complete review of systems is positive for hearing loss.  All other systems are reviewed and negative.   Physical Exam: VS:  BP (!) 148/70   Pulse 65   Ht 6\' 1"  (1.854 m)   Wt 212 lb (96.2 kg)   SpO2 96%   BMI 27.97 kg/m , BMI Body mass index is 27.97 kg/m.  Wt Readings from Last 3 Encounters:  12/11/16 212 lb (96.2 kg)  09/08/16 210 lb (95.3 kg)  08/29/16 213 lb (96.6 kg)    Elderly male, appears comfortable at rest. Using a cane. HEENT: Conjunctiva and lids normal, oropharynx clear. Neck: Supple, no elevated JVP, no carotid bruits, no  thyromegaly. Lungs: Clear to auscultation, nonlabored breathing at rest. No pleural rub. Cardiac: Regular rate and rhythm, no S3, 2/6 basal systolic murmur, no pericardial rub. Abdomen: Soft, nontender, bowel sounds present, no guarding or rebound. Extremities: Trace ankle edema, distal pulses 2+.  ECG: I personally reviewed the tracing from 07/16/2016 which showed sinus rhythm with prolonged PR interval, left anterior fascicular block, and poor R wave progression.  Recent Labwork: 04/09/2016: Magnesium 2.1 07/01/2016: ALT 22; AST 24; BUN 25; Creatinine, Ser 1.02; Hemoglobin 13.5; Platelets 223; Potassium 4.3; Sodium 134     Component Value Date/Time   CHOL 181 12/20/2015 0705   TRIG 177 (H) 12/20/2015 0705   HDL 36 (L) 12/20/2015 0705   CHOLHDL 5.0 12/20/2015 0705   VLDL 35 12/20/2015 0705   LDLCALC 110 (H) 12/20/2015 0705    Other Studies Reviewed Today:  Echocardiogram 07/18/2016: Study Conclusions  - Left ventricle: The cavity size was normal. Wall thickness was normal. Systolic function was vigorous. The estimated ejection fraction was in the range of 65% to 70%. Doppler parameters are consistent with abnormal left ventricular relaxation (grade 1 diastolic dysfunction). - Aortic valve: Moderately calcified annulus. Trileaflet; moderately thickened leaflets. There was mild regurgitation. Mean gradient (S): 11 mm Hg. Valve area (VTI): 2.08 cm^2. Valve area (Vmax): 2.16 cm^2. Valve area (Vmean): 2.39 cm^2. - Mitral valve: Mildly calcified annulus. Mildly thickened leaflets . There was mild regurgitation. - Atrial septum: No defect or patent foramen ovale was identified. - Technically difficult study.  Assessment and Plan:  1. Orthostatic intolerance likely related to autonomic dysfunction and moderate vertebrobasilar insufficiency. He has undergone recent medication adjustments with which I agree and is feeling better back on Florinef 0.1 mg daily. Continue  with current plan.  2. Hyperlipidemia, continues on Lipitor.  Current medicines were reviewed with the patient today.  Disposition: Follow-up in 3 months.  Signed, [name], MD, [facility] 12/11/2016 1:20 PM    [facility] [place], [place], [place] [address] Phone: [phone]; Fax: [phone]

## 2016-12-11 ENCOUNTER — Encounter: Payer: Self-pay | Admitting: Cardiology

## 2016-12-11 ENCOUNTER — Ambulatory Visit (INDEPENDENT_AMBULATORY_CARE_PROVIDER_SITE_OTHER): Payer: Medicare Other | Admitting: Cardiology

## 2016-12-11 VITALS — BP 148/70 | HR 65 | Ht 73.0 in | Wt 212.0 lb

## 2016-12-11 DIAGNOSIS — I951 Orthostatic hypotension: Secondary | ICD-10-CM

## 2016-12-11 DIAGNOSIS — E782 Mixed hyperlipidemia: Secondary | ICD-10-CM

## 2016-12-11 NOTE — Patient Instructions (Signed)
Your physician recommends that you schedule a follow-up appointment in: 3 MONTHS WITH DR. MCDOWELL  Your physician recommends that you continue on your current medications as directed. Please refer to the Current Medication list given to you today.  Thank you for choosing [facility]!!    

## 2017-01-06 ENCOUNTER — Other Ambulatory Visit (HOSPITAL_COMMUNITY): Payer: Self-pay | Admitting: Internal Medicine

## 2017-01-06 DIAGNOSIS — M5416 Radiculopathy, lumbar region: Secondary | ICD-10-CM

## 2017-01-12 ENCOUNTER — Ambulatory Visit (HOSPITAL_COMMUNITY)
Admission: RE | Admit: 2017-01-12 | Discharge: 2017-01-12 | Disposition: A | Discharge status: Home / Self Care | Payer: Medicare Other | Source: Ambulatory Visit | Attending: Internal Medicine | Admitting: Internal Medicine

## 2017-01-12 DIAGNOSIS — M5127 Other intervertebral disc displacement, lumbosacral region: Secondary | ICD-10-CM | POA: Insufficient documentation

## 2017-01-12 DIAGNOSIS — M5416 Radiculopathy, lumbar region: Secondary | ICD-10-CM

## 2017-01-14 IMAGING — CR DG KNEE 1-2V PORT*L*
4 series · 4 of 4 positions shown · non-contrast
Comparison: None.

CLINICAL DATA: Revision total left knee arthroplasty.

EXAM:
PORTABLE LEFT KNEE - 1-2 VIEW

[AP (1 of 2)]
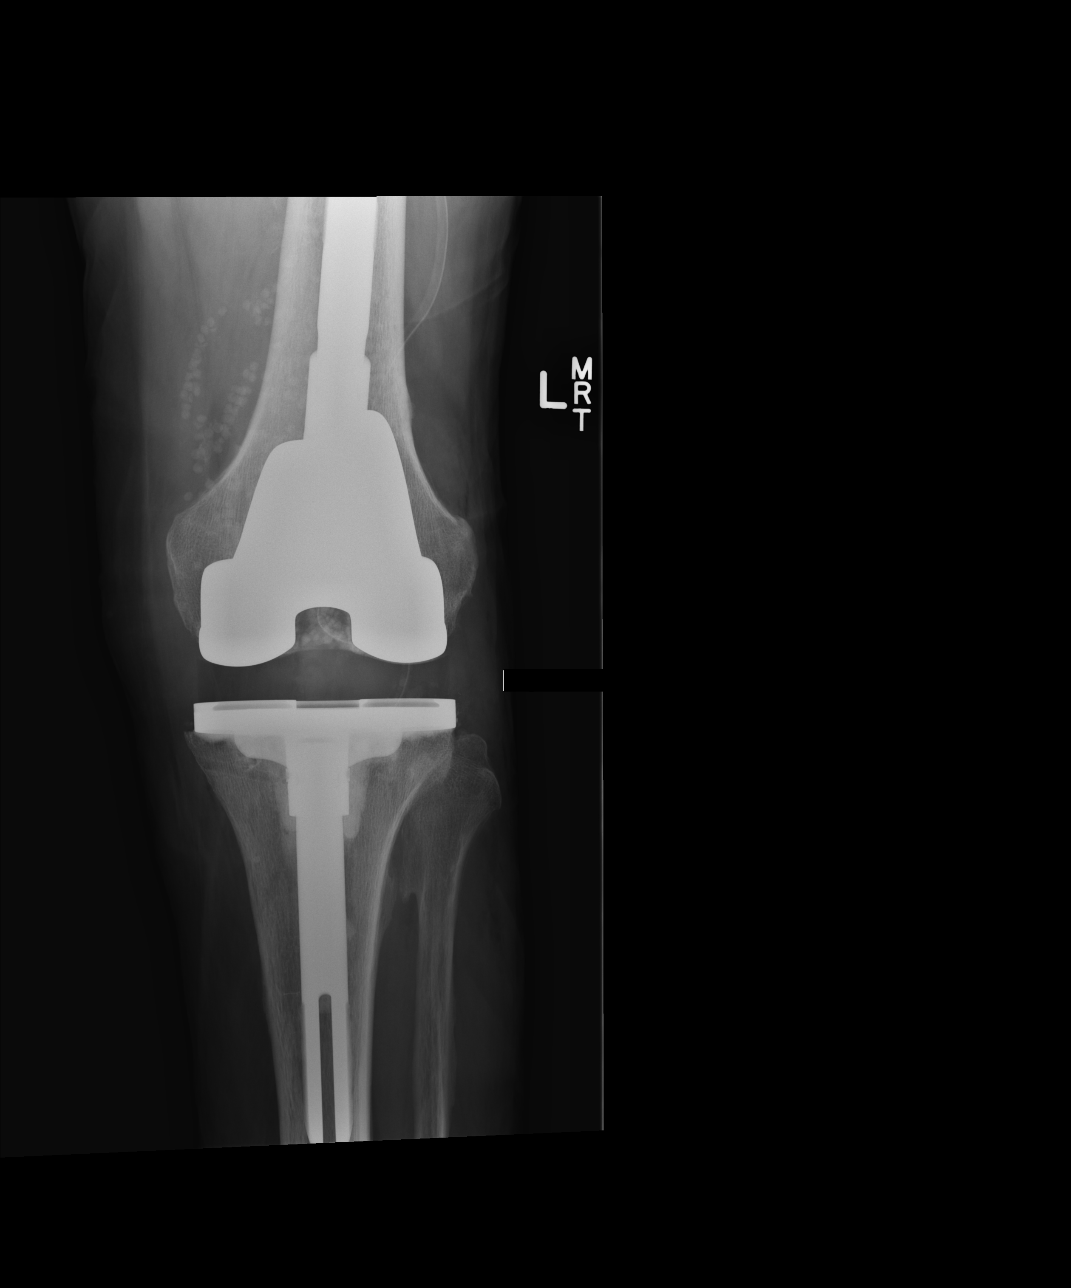

[xtable lateral]
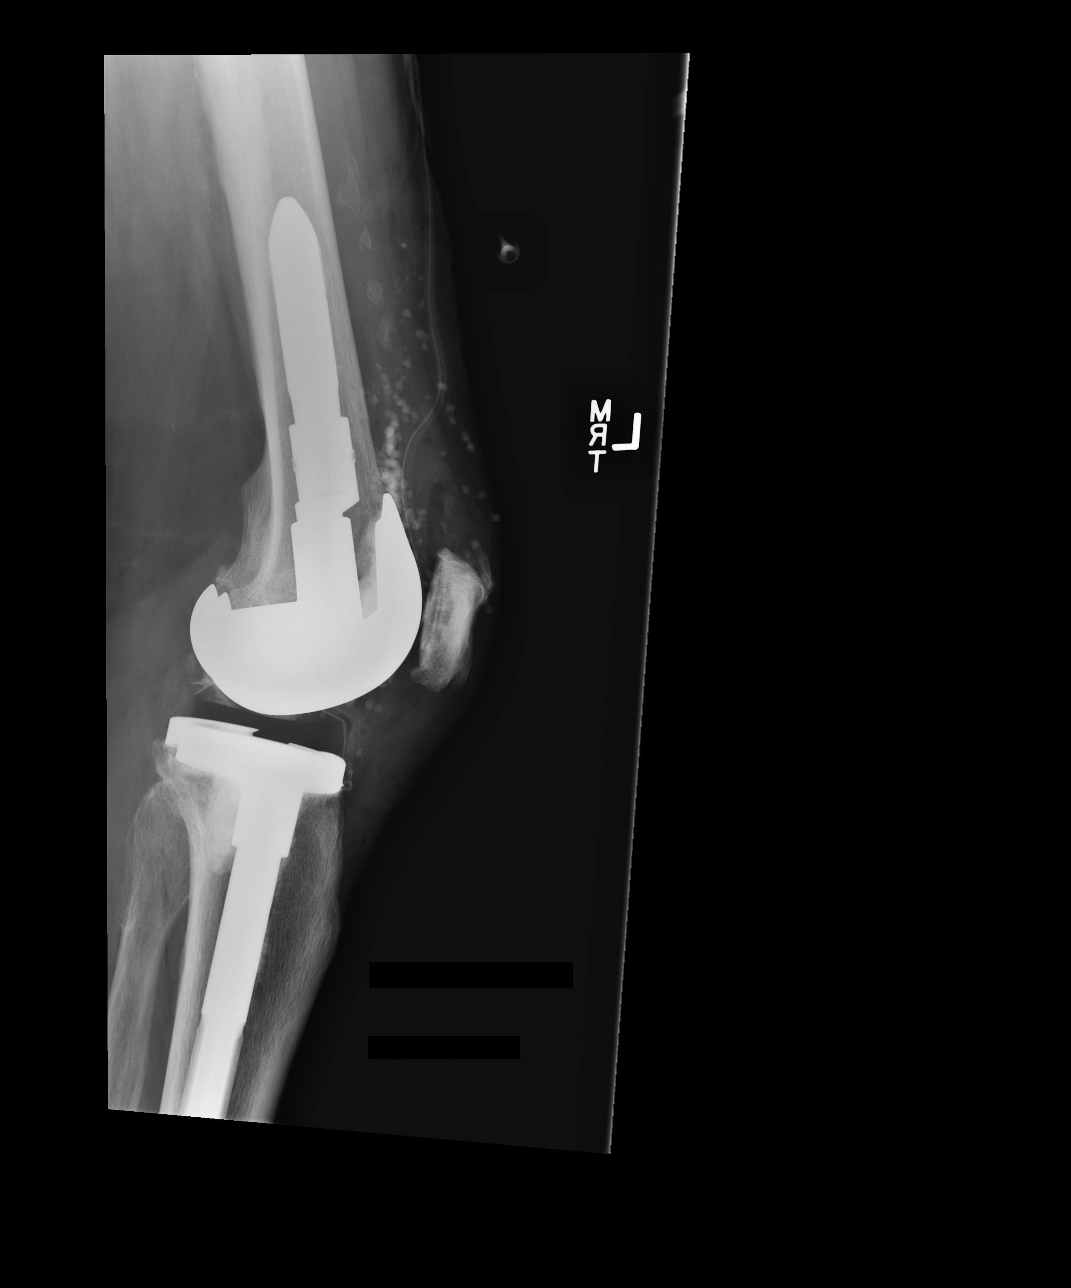

[AP (2 of 2)]
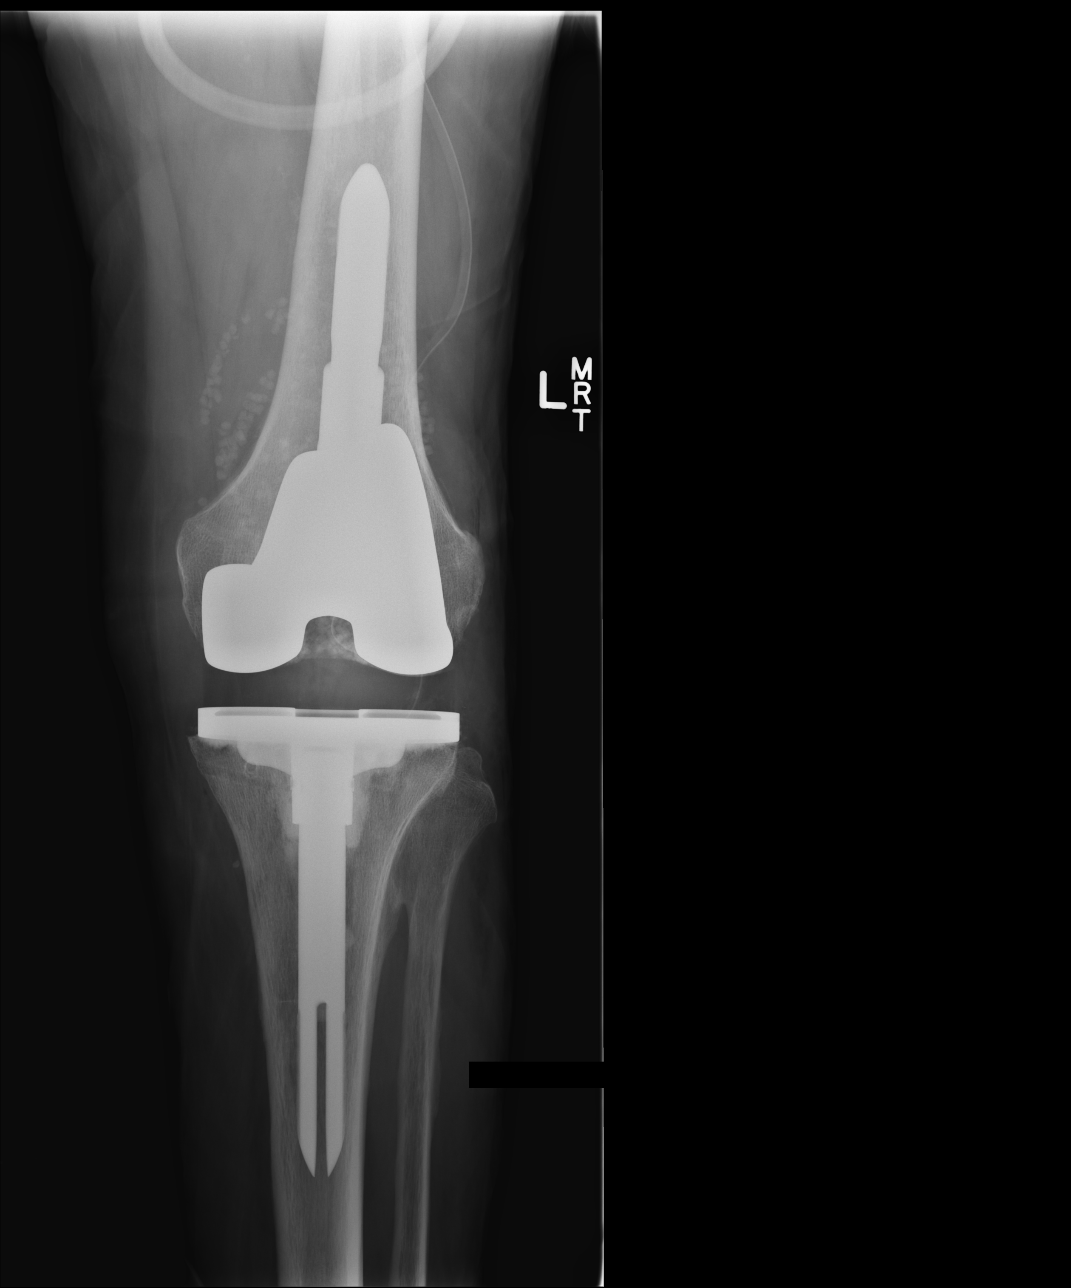

[lateral]
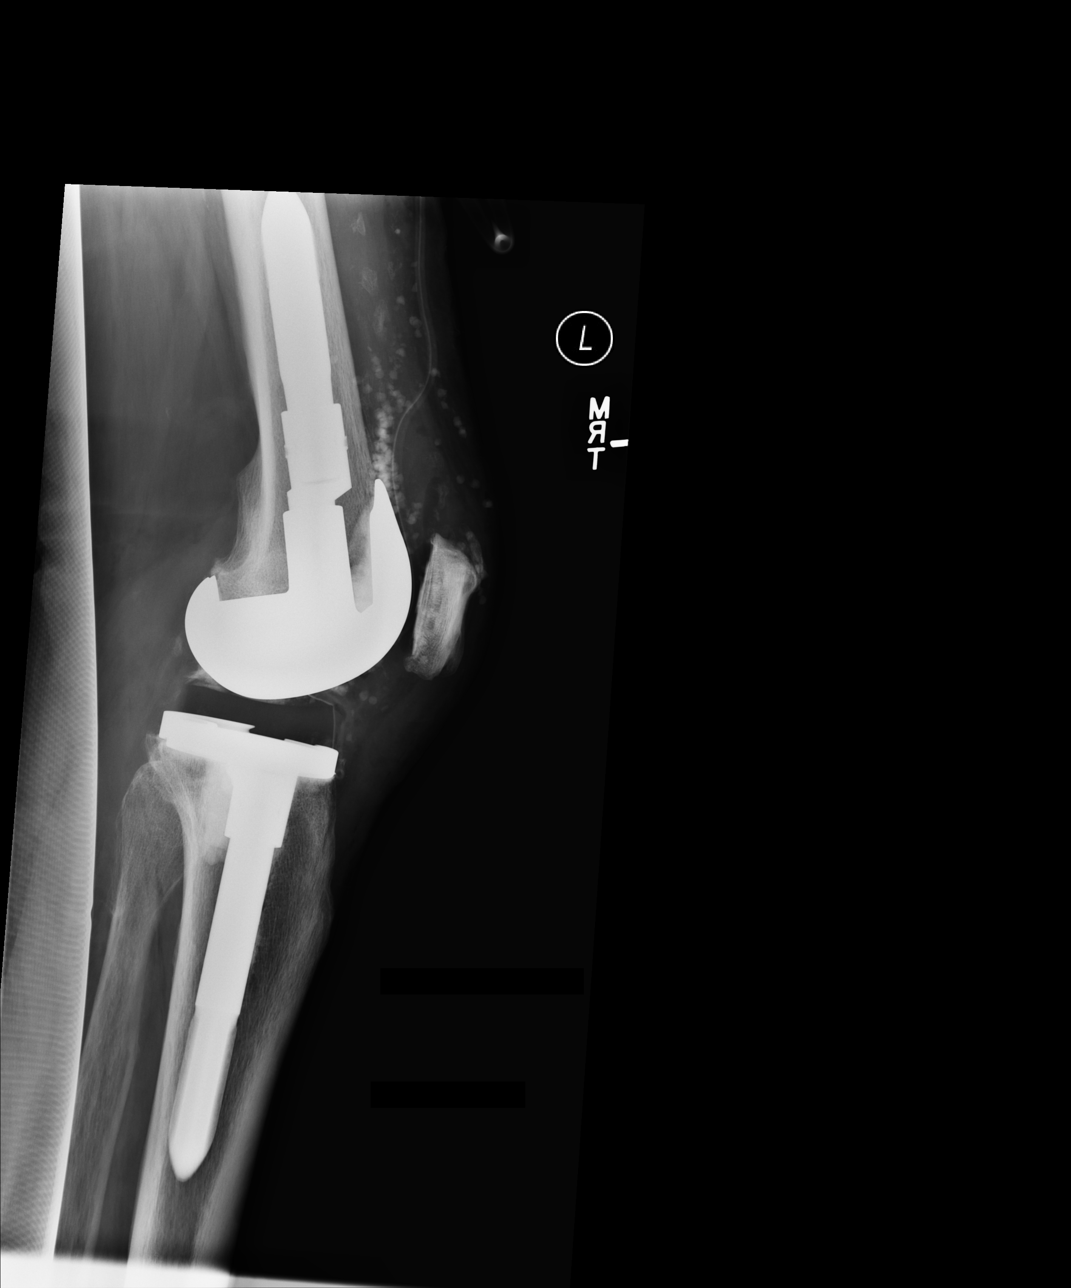

[4 of 4 positions shown; findings below may reference images not displayed]

FINDINGS: Patient status post interval revision of left knee arthroplasty.
Hardware appears intact without acute osseous abnormality. Multiple
antibiotic beads are demonstrated within the suprapatellar soft
tissues. Overlying surgical drain.
IMPRESSION: Interval revision left knee arthroplasty. No acute osseous
abnormality.

## 2017-01-20 ENCOUNTER — Encounter (HOSPITAL_COMMUNITY): Payer: Self-pay | Admitting: Emergency Medicine

## 2017-01-20 ENCOUNTER — Emergency Department (HOSPITAL_COMMUNITY)
Admission: EM | Admit: 2017-01-20 | Discharge: 2017-01-20 | Disposition: A | Discharge status: Home / Self Care | Payer: Medicare Other | Attending: Emergency Medicine | Admitting: Emergency Medicine

## 2017-01-20 DIAGNOSIS — I129 Hypertensive chronic kidney disease with stage 1 through stage 4 chronic kidney disease, or unspecified chronic kidney disease: Secondary | ICD-10-CM | POA: Insufficient documentation

## 2017-01-20 DIAGNOSIS — K5792 Diverticulitis of intestine, part unspecified, without perforation or abscess without bleeding: Secondary | ICD-10-CM

## 2017-01-20 DIAGNOSIS — E1122 Type 2 diabetes mellitus with diabetic chronic kidney disease: Secondary | ICD-10-CM | POA: Insufficient documentation

## 2017-01-20 DIAGNOSIS — Z87891 Personal history of nicotine dependence: Secondary | ICD-10-CM | POA: Insufficient documentation

## 2017-01-20 DIAGNOSIS — K5781 Diverticulitis of intestine, part unspecified, with perforation and abscess with bleeding: Secondary | ICD-10-CM | POA: Insufficient documentation

## 2017-01-20 DIAGNOSIS — N189 Chronic kidney disease, unspecified: Secondary | ICD-10-CM | POA: Insufficient documentation

## 2017-01-20 DIAGNOSIS — R109 Unspecified abdominal pain: Secondary | ICD-10-CM | POA: Diagnosis present

## 2017-01-20 LAB — COMPREHENSIVE METABOLIC PANEL
ALT: 28 U/L (ref 17–63)
ANION GAP: 8 (ref 5–15)
AST: 29 U/L (ref 15–41)
Albumin: 4.2 g/dL (ref 3.5–5.0)
Alkaline Phosphatase: 68 U/L (ref 38–126)
BUN: 29 mg/dL — ABNORMAL HIGH (ref 6–20)
CHLORIDE: 104 mmol/L (ref 101–111)
CO2: 23 mmol/L (ref 22–32)
Calcium: 9.3 mg/dL (ref 8.9–10.3)
Creatinine, Ser: 1.27 mg/dL — ABNORMAL HIGH (ref 0.61–1.24)
GFR calc non Af Amer: 50 mL/min — ABNORMAL LOW (ref >60)
GFR, EST AFRICAN AMERICAN: 58 mL/min — AB (ref >60)
Glucose, Bld: 135 mg/dL — ABNORMAL HIGH (ref 65–99)
POTASSIUM: 4.4 mmol/L (ref 3.5–5.1)
SODIUM: 135 mmol/L (ref 135–145)
Total Bilirubin: 0.9 mg/dL (ref 0.3–1.2)
Total Protein: 7.6 g/dL (ref 6.5–8.1)

## 2017-01-20 LAB — URINALYSIS, ROUTINE W REFLEX MICROSCOPIC
BILIRUBIN URINE: NEGATIVE
GLUCOSE, UA: NEGATIVE mg/dL
HGB URINE DIPSTICK: NEGATIVE
Ketones, ur: NEGATIVE mg/dL
Leukocytes, UA: NEGATIVE
Nitrite: NEGATIVE
PH: 5 (ref 5.0–8.0)
Protein, ur: NEGATIVE mg/dL
SPECIFIC GRAVITY, URINE: 1.02 (ref 1.005–1.030)

## 2017-01-20 LAB — CBC
HEMATOCRIT: 45.5 % (ref 39.0–52.0)
HEMOGLOBIN: 15.3 g/dL (ref 13.0–17.0)
MCH: 32.1 pg (ref 26.0–34.0)
MCHC: 33.6 g/dL (ref 30.0–36.0)
MCV: 95.6 fL (ref 78.0–100.0)
Platelets: 224 10*3/uL (ref 150–400)
RBC: 4.76 MIL/uL (ref 4.22–5.81)
RDW: 13.5 % (ref 11.5–15.5)
WBC: 12.5 10*3/uL — ABNORMAL HIGH (ref 4.0–10.5)

## 2017-01-20 LAB — LIPASE, BLOOD: LIPASE: 16 U/L (ref 11–51)

## 2017-01-20 MED ORDER — CIPROFLOXACIN HCL 250 MG PO TABS
500.0000 mg | ORAL_TABLET | Freq: Once | ORAL | Status: AC
  Administered 2017-01-20: 500 mg via ORAL
  Filled 2017-01-20: qty 2

## 2017-01-20 MED ORDER — CIPROFLOXACIN HCL 500 MG PO TABS: 500.0000 mg | ORAL_TABLET | Freq: Two times a day (BID) | ORAL | 0 refills | Status: DC

## 2017-01-20 MED ORDER — METRONIDAZOLE 500 MG PO TABS
500.0000 mg | ORAL_TABLET | Freq: Once | ORAL | Status: AC
  Administered 2017-01-20: 500 mg via ORAL
  Filled 2017-01-20: qty 1

## 2017-01-20 MED ORDER — METRONIDAZOLE 500 MG PO TABS: 500.0000 mg | ORAL_TABLET | Freq: Two times a day (BID) | ORAL | 0 refills | Status: DC

## 2017-01-20 NOTE — ED Provider Notes (Signed)
[facility] DEPT Provider Note   CSN: [id number] Arrival date & time: 01/20/17  [id number]     History   Chief Complaint Chief Complaint  Patient presents with  . Urinary Tract Infection  . Abdominal Pain    HPI Jared Bridges is a 83 y.o. male.   Urinary Tract Infection    Abdominal Pain      The patient is an 83 year old male, history of diverticulitis status post bowel resection many years ago, also has polymyalgia rheumatica, stroke, type 2 diabetes, hypertension and a history of prostatitis. He is currently taking Bactrim DS daily, twice a day, for possible prostate infection related to this left lower quadrant and pelvic discomfort. He reports that this is not seemed to make it better, he is having bowel movements every other day, he is passing urine and not having any urinary symptoms. He reports an intermittent but progressive left lower quadrant discomfort which is consistent with prior diverticulitis. There is no fevers, chills, nausea, vomiting, his appetite and fluid intake has been normal. There is no chest pain, cough, shortness of breath, back pain or swelling of the legs.  Past Medical History:  Diagnosis Date  . Anemia, iron deficiency   . Chronic headaches    Migraines  . Chronic kidney disease    Resolved  . Depression   . Diverticulitis   . Essential hypertension   . GI bleed    Dr. Laural Golden - 1998  . Hypercholesteremia   . Orthostatic hypotension   . Peptic ulcer disease   . Peripheral neuropathy ([place])   . Polymyalgia rheumatica ([place])   . Stroke [facility])    Residual trouble reading and writing  . Syncope   . Thyroid nodule   . Type 2 diabetes mellitus [facility])     Patient Active Problem List   Diagnosis Date Noted  . Acute cholecystitis 04/05/2016  . 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 Total knee replacement status 02/28/2016  . CVA (cerebral infarction) 12/19/2015  . Acute CVA (cerebrovascular accident) ([place]) 12/19/2015  .  Carotid stenosis 12/19/2015  . Postoperative wound dehiscence 07/09/2015  . Type 2 diabetes mellitus, uncontrolled ([place]) 06/29/2015  . Cellulitis of knee, left 06/29/2015  . Wound infection after surgery 06/29/2015  . OA (osteoarthritis) of knee 05/28/2015  . BRBPR (bright red blood per rectum) 11/21/2014  . Orthostatic hypotension 11/21/2014  . Dizziness 11/16/2014  . Polymyalgia rheumatica ([place]) 09/12/2014  . Anemia, iron deficiency 08/16/2014  . DJD (degenerative joint disease) 08/16/2014  . Essential hypertension   . Type 2 diabetes mellitus ([place])   . Cataract, nuclear 04/11/2014  . Complaining of back-related symptom 03/15/2014  . Arthralgia of hip or thigh 03/15/2014  . Gonalgia 10/10/2013  . 1st degree AV block 08/04/2013  . Infection of the upper respiratory tract 04/05/2013  . Cataract 02/24/2013  . Pseudoaphakia 02/24/2013  . Episode of syncope 01/11/2013  . Cardiac murmur 11/09/2012  . Near syncope 11/09/2012  . Anxiety 06/18/2012  . Clinical depression 06/18/2012  . Bleeding gastrointestinal 06/18/2012  . Diverticulitis 06/18/2012  . Difficulty hearing 06/18/2012  . Adaptive colitis 06/18/2012  . Arthritis, degenerative 06/18/2012  . Disease of thyroid gland 06/18/2012  . Multinodular goiter 01/29/2012  . 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 Adenoma of large intestine 05/31/2011  . Cephalalgia 05/31/2011  . Hypercholesterolemia 05/31/2011  . Benign fibroma of prostate 05/31/2011  . LBP (low back pain) 05/31/2011  . Diabetes mellitus ([place]) 05/31/2011  . Essential (primary) hypertension 05/31/2011  Past Surgical History:  Procedure Laterality Date  . APPENDECTOMY     as child  . BACK SURGERY     2000 IDET SPINAL PROC  . CHOLECYSTECTOMY N/A 04/07/2016   Procedure: CHOLECYSTECTOMY;  Surgeon: Aviva Signs, MD;  Location: [facility];  Service: General;  Laterality: N/A;  . [place]  . CRYOTHERAPY    . EYE SURGERY     right cataract  with lens implant  . FEMUR SURGERY     RT LEG        1953  . HAND SURGERY     RIGHT  . HERNIA REPAIR     1-right inguinal, 3- left inguinal  . IRRIGATION AND DEBRIDEMENT KNEE Left 07/09/2015   Procedure: LEFT KNEE IRRIGATION AND DEBRIDEMENT WOUND CLOSURE;  Surgeon: Gaynelle Arabian, MD;  Location: [facility];  Service: Orthopedics;  Laterality: Left;  . JOINT REPLACEMENT  02/28/2016   lt knee revision  . KNEE DEBRIDEMENT     2016   LEFT  . MENISCUS REPAIR     LEFT  . SHOULDER OPEN ROTATOR CUFF REPAIR     RT SHOULDER  . SPINAL FUSION    . SPINAL FUSION    . TONSILLECTOMY    . TOTAL KNEE ARTHROPLASTY Left 05/28/2015   Procedure: LEFT TOTAL KNEE ARTHROPLASTY;  Surgeon: Gaynelle Arabian, MD;  Location[facility];  Service: Orthopedics;  Laterality: Left;  . TOTAL KNEE REVISION Left 02/28/2016   Procedure: LEFT TOTAL KNEE REVISION;  Surgeon: Leandrew Koyanagi, MD;  Location: [place];  Service: Orthopedics;  Laterality: Left;  . TRIGGER FINGER RELEASE     RT HAND   1990S  . UVULECTOMY  2004   TURBINATE,TONSIL,ADENOID       Home Medications    Prior to Admission medications   Medication Sig Start Date End Date Taking? Authorizing Provider  aspirin EC 81 MG tablet Take 81 mg by mouth every morning.    Historical Provider, MD  atorvastatin (LIPITOR) 40 MG tablet Take 1 tablet (40 mg total) by mouth daily at 6 PM. 12/20/15   Orvan Falconer, MD  Cholecalciferol (VITAMIN D3) 5000 units CAPS Take 5,000 Units by mouth at bedtime.    Historical Provider, MD  ciprofloxacin (CIPRO) 500 MG tablet Take 1 tablet (500 mg total) by mouth every 12 (twelve) hours. 01/20/17   Noemi Chapel, MD  clobetasol (TEMOVATE) 0.05 % external solution Apply 1 application topically 2 (two) times daily as needed. 08/26/16   Historical Provider, MD  diclofenac sodium (VOLTAREN) 1 % GEL Apply 2 g topically daily as needed (for pain). 11/22/15   Asencion Partridge Dohmeier, MD  escitalopram (LEXAPRO) 5 MG tablet Take 5 mg by mouth at bedtime. 01/31/16    Historical Provider, MD  fludrocortisone (FLORINEF) 0.1 MG tablet Take 0.1 mg by mouth daily.    Historical Provider, MD  fluticasone (FLONASE) 50 MCG/ACT nasal spray Place 1 spray into both nostrils 2 (two) times daily as needed for allergies.  01/07/16   Historical Provider, MD  hydrocortisone 2.5 % cream Apply 1 application topically as needed. 08/26/16   Historical Provider, MD  ibuprofen (ADVIL,MOTRIN) 800 MG tablet Take 800 mg by mouth 3 (three) times daily with meals. 08/19/16   Historical Provider, MD  metroNIDAZOLE (FLAGYL) 500 MG tablet Take 1 tablet (500 mg total) by mouth 2 (two) times daily. 01/20/17   Noemi Chapel, MD  predniSONE (DELTASONE) 5 MG tablet Take 5 mg by mouth 2 (  two) times a week. Tuesday and Thursday 10/03/14   Historical Provider, MD  pregabalin (LYRICA) 50 MG capsule Take 50 mg by mouth daily.    Historical Provider, MD  venlafaxine XR (EFFEXOR-XR) 37.5 MG 24 hr capsule Take 1 capsule by mouth daily. 08/15/16   Historical Provider, MD  zolpidem (AMBIEN) 5 MG tablet Take 1-2 mg by mouth at bedtime. Takes only if he doesn't take clonazepam 08/18/16   Historical Provider, MD    Family History Family History  Problem Relation Age of Onset  . Colon cancer Sister   . Hypertension    . Depression Brother   . Depression Daughter     Social History Social History  Substance Use Topics  . Smoking status: Former Smoker    Packs/day: 0.10    Years: 18.00    Types: Cigarettes    Start date: 12/18/1958    Quit date: 12/01/1986  . Smokeless tobacco: Never Used  . Alcohol use No     Allergies   Calan [verapamil]; Cymbalta [duloxetine hcl]; Iodinated diagnostic agents; Iodine; Latex; Lisinopril; Neurontin [gabapentin]; Zoloft [sertraline hcl]; Protonix [pantoprazole sodium]; Valium [diazepam]; Flomax [tamsulosin hcl]; Glimepiride; Melatonin; and Tape   Review of Systems Review of Systems  Gastrointestinal: Positive for abdominal pain.  All other systems reviewed and are  negative.    Physical Exam Updated Vital Signs BP 123/88 (BP Location: Right Arm)   Pulse 70   Temp 98.3 F (36.8 C) (Oral)   Resp 18   Ht 6\' 1"  (1.854 m)   Wt 210 lb (95.3 kg)   SpO2 97%   BMI 27.71 kg/m   Physical Exam  Constitutional: He appears well-developed and well-nourished. No distress.  HENT:  Head: Normocephalic and atraumatic.  Mouth/Throat: Oropharynx is clear and moist. No oropharyngeal exudate.  Eyes: Conjunctivae and EOM are normal. Pupils are equal, round, and reactive to light. Right eye exhibits no discharge. Left eye exhibits no discharge. No scleral icterus.  Neck: Normal range of motion. Neck supple. No JVD present. No thyromegaly present.  Cardiovascular: Normal rate, regular rhythm and intact distal pulses.  Exam reveals no gallop and no friction rub.   Murmur heard. Pulmonary/Chest: Effort normal and breath sounds normal. No respiratory distress. He has no wheezes. He has no rales.  Abdominal: Soft. Bowel sounds are normal. He exhibits no distension and no mass. There is no tenderness.  Multiple well-healed scars over the abdominal wall, minimal left lower quadrant tenderness, no guarding, no masses, no peritoneal signs, normal bowel sounds  Musculoskeletal: Normal range of motion. He exhibits no edema or tenderness.  Lymphadenopathy:    He has no cervical adenopathy.  Neurological: He is alert. Coordination normal.  Skin: Skin is warm and dry. No rash noted. No erythema.  Psychiatric: He has a normal mood and affect. His behavior is normal.  Nursing note and vitals reviewed.    ED Treatments / Results  Labs (all labs ordered are listed, but only abnormal results are displayed) Labs Reviewed  COMPREHENSIVE METABOLIC PANEL - Abnormal; Notable for the following:       Result Value   Glucose, Bld 135 (*)    BUN 29 (*)    Creatinine, Ser 1.27 (*)    GFR calc non Af Amer 50 (*)    GFR calc Af Amer 58 (*)    All other components within normal  limits  CBC - Abnormal; Notable for the following:    WBC 12.5 (*)    All other components  within normal limits  LIPASE, BLOOD  URINALYSIS, ROUTINE W REFLEX MICROSCOPIC     Radiology No results found.  Procedures Procedures (including critical care time)  Medications Ordered in ED Medications  ciprofloxacin (CIPRO) tablet 500 mg (not administered)  metroNIDAZOLE (FLAGYL) tablet 500 mg (not administered)     Initial Impression / Assessment and Plan / ED Course  I have reviewed the triage vital signs and the nursing notes.  Pertinent labs & imaging results that were available during my care of the patient were reviewed by me and considered in my medical decision making (see chart for details).     The patient's exam is consistent with possible diverticulitis, do not see anything else acute that would suggest an intra-abdominal process. His urinalysis is clear, white blood cell count is 12,500, I discussed with the patient and the family member about possible interventions and evaluations including CT scan. He does not seem to want a CT scan if we think this is diverticulitis. At this point I do not think that there is an alternative etiology that would require a CT scan at this time as I do not think he has a perforation or an abscess or an alternative diagnosis. He has no signs of urinary symptoms to suggest prostatitis or urinary tract infection or pyelonephritis. The patient is stable appearing, antibiotics, home, anticipate discharge. The patient is in agreement with this plan  Labs with mild leukocytosis - 12.5, pt stable for d/c.  Final Clinical Impressions(s) / ED Diagnoses   Final diagnoses:  Diverticulitis of intestine without perforation or abscess without bleeding, unspecified part of intestinal tract    New Prescriptions New Prescriptions   CIPROFLOXACIN (CIPRO) 500 MG TABLET    Take 1 tablet (500 mg total) by mouth every 12 (twelve) hours.   METRONIDAZOLE (FLAGYL)  500 MG TABLET    Take 1 tablet (500 mg total) by mouth 2 (two) times daily.     Noemi Chapel, MD 01/20/17 [phone]

## 2017-01-20 NOTE — Discharge Instructions (Signed)

## 2017-01-20 NOTE — ED Triage Notes (Signed)
Patient states he has history of UTI and is currently being treated with Septra. States urinary symptoms are not going away. States abdominal pain x 1 week.

## 2017-02-05 ENCOUNTER — Ambulatory Visit (INDEPENDENT_AMBULATORY_CARE_PROVIDER_SITE_OTHER): Payer: Self-pay | Admitting: Otolaryngology

## 2017-04-18 ENCOUNTER — Emergency Department (HOSPITAL_COMMUNITY)
Admission: EM | Admit: 2017-04-18 | Discharge: 2017-04-18 | Disposition: A | Discharge status: Home / Self Care | Payer: Medicare Other | Attending: Emergency Medicine | Admitting: Emergency Medicine

## 2017-04-18 ENCOUNTER — Encounter (HOSPITAL_COMMUNITY): Payer: Self-pay | Admitting: Emergency Medicine

## 2017-04-18 DIAGNOSIS — G43901 Migraine, unspecified, not intractable, with status migrainosus: Secondary | ICD-10-CM | POA: Insufficient documentation

## 2017-04-18 DIAGNOSIS — N189 Chronic kidney disease, unspecified: Secondary | ICD-10-CM | POA: Diagnosis not present

## 2017-04-18 DIAGNOSIS — R51 Headache: Secondary | ICD-10-CM | POA: Diagnosis present

## 2017-04-18 DIAGNOSIS — I129 Hypertensive chronic kidney disease with stage 1 through stage 4 chronic kidney disease, or unspecified chronic kidney disease: Secondary | ICD-10-CM | POA: Insufficient documentation

## 2017-04-18 DIAGNOSIS — Z87891 Personal history of nicotine dependence: Secondary | ICD-10-CM | POA: Insufficient documentation

## 2017-04-18 DIAGNOSIS — R600 Localized edema: Secondary | ICD-10-CM | POA: Insufficient documentation

## 2017-04-18 DIAGNOSIS — Z79899 Other long term (current) drug therapy: Secondary | ICD-10-CM | POA: Insufficient documentation

## 2017-04-18 DIAGNOSIS — E1122 Type 2 diabetes mellitus with diabetic chronic kidney disease: Secondary | ICD-10-CM | POA: Diagnosis not present

## 2017-04-18 LAB — CBC WITH DIFFERENTIAL/PLATELET
BASOS ABS: 0 10*3/uL (ref 0.0–0.1)
BASOS PCT: 0 %
EOS ABS: 0.4 10*3/uL (ref 0.0–0.7)
Eosinophils Relative: 5 %
HEMATOCRIT: 42.2 % (ref 39.0–52.0)
HEMOGLOBIN: 14.1 g/dL (ref 13.0–17.0)
Lymphocytes Relative: 15 %
Lymphs Abs: 1.3 10*3/uL (ref 0.7–4.0)
MCH: 32.2 pg (ref 26.0–34.0)
MCHC: 33.4 g/dL (ref 30.0–36.0)
MCV: 96.3 fL (ref 78.0–100.0)
Monocytes Absolute: 1 10*3/uL (ref 0.1–1.0)
Monocytes Relative: 12 %
NEUTROS ABS: 6 10*3/uL (ref 1.7–7.7)
NEUTROS PCT: 68 %
Platelets: 205 10*3/uL (ref 150–400)
RBC: 4.38 MIL/uL (ref 4.22–5.81)
RDW: 13.4 % (ref 11.5–15.5)
WBC: 8.6 10*3/uL (ref 4.0–10.5)

## 2017-04-18 LAB — SALICYLATE LEVEL

## 2017-04-18 LAB — BASIC METABOLIC PANEL
ANION GAP: 6 (ref 5–15)
BUN: 18 mg/dL (ref 6–20)
CO2: 30 mmol/L (ref 22–32)
CREATININE: 1.03 mg/dL (ref 0.61–1.24)
Calcium: 9.6 mg/dL (ref 8.9–10.3)
Chloride: 106 mmol/L (ref 101–111)
Glucose, Bld: 132 mg/dL — ABNORMAL HIGH (ref 65–99)
Potassium: 3.8 mmol/L (ref 3.5–5.1)
SODIUM: 142 mmol/L (ref 135–145)

## 2017-04-18 MED ORDER — FENTANYL CITRATE (PF) 100 MCG/2ML IJ SOLN
25.0000 ug | Freq: Once | INTRAMUSCULAR | Status: AC
  Administered 2017-04-18: 25 ug via INTRAVENOUS
  Filled 2017-04-18: qty 2

## 2017-04-18 MED ORDER — KETOROLAC TROMETHAMINE 30 MG/ML IJ SOLN
15.0000 mg | Freq: Once | INTRAMUSCULAR | Status: AC
  Administered 2017-04-18: 15 mg via INTRAVENOUS
  Filled 2017-04-18: qty 1

## 2017-04-18 MED ORDER — SODIUM CHLORIDE 0.9 % IV BOLUS (SEPSIS)
500.0000 mL | Freq: Once | INTRAVENOUS | Status: AC
  Administered 2017-04-18: 500 mL via INTRAVENOUS

## 2017-04-18 MED ORDER — VALPROATE SODIUM 500 MG/5ML IV SOLN
500.0000 mg | Freq: Once | INTRAVENOUS | Status: AC
  Administered 2017-04-18: 500 mg via INTRAVENOUS
  Filled 2017-04-18: qty 5

## 2017-04-18 MED ORDER — PROMETHAZINE HCL 25 MG/ML IJ SOLN
12.5000 mg | Freq: Once | INTRAMUSCULAR | Status: AC
  Administered 2017-04-18: 12.5 mg via INTRAVENOUS
  Filled 2017-04-18: qty 1

## 2017-04-18 MED ORDER — VALPROATE SODIUM 500 MG/5ML IV SOLN
INTRAVENOUS | Status: AC
  Filled 2017-04-18: qty 5

## 2017-04-18 NOTE — ED Notes (Signed)
E-signature pad not working in room, unable to obtain signature.  Pt verbalized understanding of d/c instructions

## 2017-04-18 NOTE — ED Notes (Signed)
ED Provider at bedside. 

## 2017-04-18 NOTE — ED Triage Notes (Signed)
Intermittent Headache for the last three weeks- took his cocktail x 1 without relief- Per daughter, he is taking 7 asa daily and is also a GI risk  Dr Presley Raddle is his Neuro who was called by pt and family yesterday but did not respond

## 2017-04-18 NOTE — ED Provider Notes (Signed)
[facility] DEPT Provider Note   CSN: [id number] Arrival date & time: 04/18/17  1356     History   Chief Complaint Chief Complaint  Patient presents with  . Headache    intermittent for 3 weeks    HPI Jared Bridges is a 83 y.o. male.  HPI Patient presents with headaches. Has a history of chronic migraines. His been more frequent over the last 3 weeks. States is getting worse. Dull the back was going to the front. Saw his neurologist and had injection of Reglan around 2 weeks ago. He did not really get better. No numbness or weakness. States light bothers him. This is similar to previous headaches. Patient's daughter is worried because he has some much edema on his feet. He has only trace edema on my exam. Patient's daughter states he is taking 7 aspirin a day due to the headaches. States he has had ulcers in the past. He has not had any abdominal pain or blood in stool. No dysuria.   Past Medical History:  Diagnosis Date  . Anemia, iron deficiency   . Chronic headaches    Migraines  . Chronic kidney disease    Resolved  . Depression   . Diverticulitis   . Essential hypertension   . GI bleed    Dr. Laural Golden - 1998  . Hypercholesteremia   . Orthostatic hypotension   . Peptic ulcer disease   . Peripheral neuropathy   . Polymyalgia rheumatica ([place])   . Stroke [facility])    Residual trouble reading and writing  . Syncope   . Thyroid nodule   . Type 2 diabetes mellitus [facility])     Patient Active Problem List   Diagnosis Date Noted  . Acute cholecystitis 04/05/2016  . 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 Total knee replacement status 02/28/2016  . CVA (cerebral infarction) 12/19/2015  . Acute CVA (cerebrovascular accident) ([place]) 12/19/2015  . Carotid stenosis 12/19/2015  . Postoperative wound dehiscence 07/09/2015  . Type 2 diabetes mellitus, uncontrolled ([place]) 06/29/2015  . Cellulitis of knee, left 06/29/2015  . Wound infection after surgery  06/29/2015  . OA (osteoarthritis) of knee 05/28/2015  . BRBPR (bright red blood per rectum) 11/21/2014  . Orthostatic hypotension 11/21/2014  . Dizziness 11/16/2014  . Polymyalgia rheumatica ([place]) 09/12/2014  . Anemia, iron deficiency 08/16/2014  . DJD (degenerative joint disease) 08/16/2014  . Essential hypertension   . Type 2 diabetes mellitus ([place])   . Cataract, nuclear 04/11/2014  . Complaining of back-related symptom 03/15/2014  . Arthralgia of hip or thigh 03/15/2014  . Gonalgia 10/10/2013  . 1st degree AV block 08/04/2013  . Infection of the upper respiratory tract 04/05/2013  . Cataract 02/24/2013  . Pseudoaphakia 02/24/2013  . Episode of syncope 01/11/2013  . Cardiac murmur 11/09/2012  . Near syncope 11/09/2012  . Anxiety 06/18/2012  . Clinical depression 06/18/2012  . Bleeding gastrointestinal 06/18/2012  . Diverticulitis 06/18/2012  . Difficulty hearing 06/18/2012  . Adaptive colitis 06/18/2012  . Arthritis, degenerative 06/18/2012  . Disease of thyroid gland 06/18/2012  . Multinodular goiter 01/29/2012  . 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 Adenoma of large intestine 05/31/2011  . Cephalalgia 05/31/2011  . Hypercholesterolemia 05/31/2011  . Benign fibroma of prostate 05/31/2011  . LBP (low back pain) 05/31/2011  . Diabetes mellitus ([place]) 05/31/2011  . Essential (primary) hypertension 05/31/2011    Past Surgical History:  Procedure Laterality Date  . APPENDECTOMY     as child  . BACK SURGERY  2000 IDET SPINAL PROC  . CHOLECYSTECTOMY N/A 04/07/2016   Procedure: CHOLECYSTECTOMY;  Surgeon: Aviva Signs, MD;  Location: [facility];  Service: General;  Laterality: N/A;  . [place]  . CRYOTHERAPY    . EYE SURGERY     right cataract with lens implant  . FEMUR SURGERY     RT LEG        1953  . HAND SURGERY     RIGHT  . HERNIA REPAIR     1-right inguinal, 3- left inguinal  . IRRIGATION AND DEBRIDEMENT KNEE Left 07/09/2015   Procedure:  LEFT KNEE IRRIGATION AND DEBRIDEMENT WOUND CLOSURE;  Surgeon: Gaynelle Arabian, MD;  [facility];  Service: Orthopedics;  Laterality: Left;  . JOINT REPLACEMENT  02/28/2016   lt knee revision  . KNEE DEBRIDEMENT     2016   LEFT  . MENISCUS REPAIR     LEFT  . SHOULDER OPEN ROTATOR CUFF REPAIR     RT SHOULDER  . SPINAL FUSION    . SPINAL FUSION    . TONSILLECTOMY    . TOTAL KNEE ARTHROPLASTY Left 05/28/2015   Procedure: LEFT TOTAL KNEE ARTHROPLASTY;  Surgeon: Gaynelle Arabian, MD;  [facility];  Service: Orthopedics;  Laterality: Left;  . TOTAL KNEE REVISION Left 02/28/2016   Procedure: LEFT TOTAL KNEE REVISION;  Surgeon: Leandrew Koyanagi, MD;  Location: [place];  Service: Orthopedics;  Laterality: Left;  . TRIGGER FINGER RELEASE     RT HAND   1990S  . UVULECTOMY  2004   TURBINATE,TONSIL,ADENOID       Home Medications    Prior to Admission medications   Medication Sig Start Date End Date Taking? Authorizing Provider  aspirin EC 81 MG tablet Take 81 mg by mouth every morning.   Yes [provider]  Cholecalciferol (VITAMIN D3) 5000 units CAPS Take 5,000 Units by mouth at bedtime.   Yes [provider]  diclofenac sodium (VOLTAREN) 1 % GEL Apply 2 g topically daily as needed (for pain). 11/22/15  Yes Dohmeier, Asencion Partridge, MD  escitalopram (LEXAPRO) 10 MG tablet Take 10 mg by mouth at bedtime.  01/31/16  Yes [provider]  fludrocortisone (FLORINEF) 0.1 MG tablet Take 0.1 mg by mouth every morning.    Yes [provider]  fluticasone (FLONASE) 50 MCG/ACT nasal spray Place 1 spray into both nostrils 2 (two) times daily as needed for allergies.  01/07/16  Yes [provider]  hydrocortisone 2.5 % cream Apply 1 application topically as needed. 08/26/16  Yes [provider]  predniSONE (DELTASONE) 5 MG tablet Take 5 mg by mouth every Tuesday.  10/03/14  Yes [provider]  pregabalin (LYRICA) 50 MG capsule Take 50 mg by mouth every  evening.    Yes [provider]  tamsulosin (FLOMAX) 0.4 MG CAPS capsule Take 0.4 mg by mouth every evening.   Yes [provider]  venlafaxine XR (EFFEXOR-XR) 37.5 MG 24 hr capsule Take 1 capsule by mouth every morning.  08/15/16  Yes [provider]  zolpidem (AMBIEN) 5 MG tablet Take 1-2 mg by mouth at bedtime. Takes only if he doesn't take clonazepam 08/18/16  Yes [provider]  clobetasol (TEMOVATE) 0.05 % external solution Apply 1 application topically 2 (two) times daily as needed. 08/26/16   [provider]    Family History Family History  Problem Relation Age of Onset  . Colon cancer Sister   .  Hypertension Unknown   . Depression Brother   . Depression Daughter     Social History Social History  Substance Use Topics  . Smoking status: Former Smoker    Packs/day: 0.10    Years: 18.00    Types: Cigarettes    Start date: 12/18/1958    Quit date: 12/01/1986  . Smokeless tobacco: Never Used  . Alcohol use No     Allergies   Calan [verapamil]; Cymbalta [duloxetine hcl]; Iodinated diagnostic agents; Iodine; Latex; Lisinopril; Neurontin [gabapentin]; Zoloft [sertraline hcl]; Protonix [pantoprazole sodium]; Valium [diazepam]; Flomax [tamsulosin hcl]; Glimepiride; Melatonin; and Tape   Review of Systems Review of Systems  Constitutional: Positive for appetite change. Negative for fever.  HENT: Negative for congestion.   Respiratory: Negative for cough and choking.   Cardiovascular: Positive for leg swelling. Negative for chest pain.  Gastrointestinal: Positive for nausea.  Genitourinary: Negative for flank pain.  Musculoskeletal: Negative for back pain.  Neurological: Positive for headaches.  Hematological: Negative for adenopathy.  Psychiatric/Behavioral: Negative for confusion.     Physical Exam Updated Vital Signs BP (!) 158/83 (BP Location: Right Arm)   Pulse (!) 57   Temp 98.6 F (37 C) (Oral)   Resp 16   Ht 6'  (1.829 m)   Wt 216 lb (98 kg)   SpO2 94%   BMI 29.29 kg/m   Physical Exam  Constitutional: He appears well-developed.  HENT:  Head: Atraumatic.  Eyes: EOM are normal.  Neck: Neck supple.  Cardiovascular: Normal rate.   Pulmonary/Chest: Effort normal.  Abdominal: There is no tenderness.  Musculoskeletal: He exhibits edema.  Only trace edema bilateral lower legs. Minimal  Neurological: He is alert.  Skin: Skin is warm. Capillary refill takes less than 2 seconds.  Psychiatric: He has a normal mood and affect.     ED Treatments / Results  Labs (all labs ordered are listed, but only abnormal results are displayed) Labs Reviewed  BASIC METABOLIC PANEL - Abnormal; Notable for the following:       Result Value   Glucose, Bld 132 (*)    All other components within normal limits  CBC WITH DIFFERENTIAL/PLATELET  SALICYLATE LEVEL    EKG  EKG Interpretation None       Radiology No results found.  Procedures Procedures (including critical care time)  Medications Ordered in ED Medications  sodium chloride 0.9 % bolus 500 mL (0 mLs Intravenous Stopped 04/18/17 1838)  promethazine (PHENERGAN) injection 12.5 mg (12.5 mg Intravenous Given 04/18/17 1614)  ketorolac (TORADOL) 30 MG/ML injection 15 mg (15 mg Intravenous Given 04/18/17 1614)  promethazine (PHENERGAN) injection 12.5 mg (12.5 mg Intravenous Given 04/18/17 1711)  valproate (DEPACON) 500 mg in dextrose 5 % 50 mL IVPB (0 mg Intravenous Stopped 04/18/17 1954)  fentaNYL (SUBLIMAZE) injection 25 mcg (25 mcg Intravenous Given 04/18/17 1838)     Initial Impression / Assessment and Plan / ED Course  I have reviewed the triage vital signs and the nursing notes.  Pertinent labs & imaging results that were available during my care of the patient were reviewed by me and considered in my medical decision making (see chart for details).     Patient with headache. History of same. Has however been lasting longer than normal.  Initial Phenergan and Toradol with fluids helped a little bit. Re-dose Phenergan did not really help much more. Then added Depakote and 25 g of fentanyl. Patient felt much better on be discharged home. Will follow-up with neurology as needed. Had reportedly  been taking frequent aspirin. Normal aspirin level.  Final Clinical Impressions(s) / ED Diagnoses   Final diagnoses:  Migraine with status migrainosus, not intractable, unspecified migraine type    New Prescriptions Discharge Medication List as of 04/18/2017  7:36 PM       Davonna Belling, MD 04/18/17 2027

## 2017-04-27 ENCOUNTER — Ambulatory Visit (INDEPENDENT_AMBULATORY_CARE_PROVIDER_SITE_OTHER): Payer: Medicare Other | Admitting: Orthopaedic Surgery

## 2017-07-07 ENCOUNTER — Ambulatory Visit (INDEPENDENT_AMBULATORY_CARE_PROVIDER_SITE_OTHER): Payer: Medicare Other | Admitting: Urology

## 2017-07-07 ENCOUNTER — Other Ambulatory Visit (HOSPITAL_COMMUNITY)
Admission: AD | Admit: 2017-07-07 | Discharge: 2017-07-07 | Disposition: A | Discharge status: Home / Self Care | Payer: Medicare Other | Source: Skilled Nursing Facility | Attending: Urology | Admitting: Urology

## 2017-07-07 DIAGNOSIS — R311 Benign essential microscopic hematuria: Secondary | ICD-10-CM | POA: Insufficient documentation

## 2017-07-07 DIAGNOSIS — N401 Enlarged prostate with lower urinary tract symptoms: Secondary | ICD-10-CM

## 2017-07-07 LAB — URINALYSIS, ROUTINE W REFLEX MICROSCOPIC
BACTERIA UA: NONE SEEN
Bilirubin Urine: NEGATIVE
Glucose, UA: NEGATIVE mg/dL
Ketones, ur: NEGATIVE mg/dL
Leukocytes, UA: NEGATIVE
NITRITE: NEGATIVE
PH: 6 (ref 5.0–8.0)
Protein, ur: NEGATIVE mg/dL
SPECIFIC GRAVITY, URINE: 1.013 (ref 1.005–1.030)
SQUAMOUS EPITHELIAL / LPF: NONE SEEN

## 2017-07-09 ENCOUNTER — Ambulatory Visit (INDEPENDENT_AMBULATORY_CARE_PROVIDER_SITE_OTHER): Payer: Medicare Other | Admitting: Otolaryngology

## 2017-07-09 DIAGNOSIS — H903 Sensorineural hearing loss, bilateral: Secondary | ICD-10-CM | POA: Diagnosis not present

## 2017-07-22 DIAGNOSIS — M6530 Trigger finger, unspecified finger: Secondary | ICD-10-CM | POA: Insufficient documentation

## 2017-07-22 DIAGNOSIS — G5602 Carpal tunnel syndrome, left upper limb: Secondary | ICD-10-CM | POA: Insufficient documentation

## 2017-09-18 ENCOUNTER — Encounter: Payer: Self-pay | Admitting: *Deleted

## 2017-09-21 ENCOUNTER — Ambulatory Visit (INDEPENDENT_AMBULATORY_CARE_PROVIDER_SITE_OTHER): Payer: Medicare Other | Admitting: Cardiology

## 2017-09-21 ENCOUNTER — Encounter: Payer: Self-pay | Admitting: Cardiology

## 2017-09-21 VITALS — BP 160/70 | HR 69 | Ht 72.6 in | Wt 220.0 lb

## 2017-09-21 DIAGNOSIS — E782 Mixed hyperlipidemia: Secondary | ICD-10-CM

## 2017-09-21 DIAGNOSIS — I951 Orthostatic hypotension: Secondary | ICD-10-CM

## 2017-09-21 NOTE — Patient Instructions (Signed)

## 2017-09-21 NOTE — Progress Notes (Signed)
Cardiology Office Note  Date: 09/21/2017   ID: Jared Bridges, DOB 08-09-1930, MRN [id number]  PCP: Doree Albee, MD  Primary Cardiologist: Rozann Lesches, MD   Chief Complaint  Patient presents with  . Cardiac follow-up    History of Present Illness: Jared Bridges is an 83 y.o. male last seen in January. He presents for a routine follow-up visit. He does not report any syncope, has mild occasional lightheadedness when he stands, only last for a few seconds. He continues to take Florinef which has been fairly effective.  Otherwise he is slowing down, reports neuropathy limits his ambulation, he uses a cane. He does not report any falls. Also having trouble with his prostate, has urology follow-up pending.  I personally reviewed his ECG today which shows sinus rhythm with PACs, leftward axis, poor R-wave progression rule out old anterior infarct pattern.  Past Medical History:  Diagnosis Date  . Anemia, iron deficiency   . Chronic headaches    Migraines  . Chronic kidney disease    Resolved  . Depression   . Diverticulitis   . Essential hypertension   . GI bleed    Dr. Laural Golden - 1998  . Hypercholesteremia   . Orthostatic hypotension   . Peptic ulcer disease   . Peripheral neuropathy   . Polymyalgia rheumatica ([place])   . Stroke [facility])    Residual trouble reading and writing  . Syncope   . Thyroid nodule   . Type 2 diabetes mellitus ([place])     Past Surgical History:  Procedure Laterality Date  . APPENDECTOMY     as child  . BACK SURGERY     2000 IDET SPINAL PROC  . CHOLECYSTECTOMY N/A 04/07/2016   Procedure: CHOLECYSTECTOMY;  Surgeon: Aviva Signs, MD;  Location: [facility];  Service: General;  Laterality: N/A;  . [place]  . CRYOTHERAPY    . EYE SURGERY     right cataract with lens implant  . FEMUR SURGERY     RT LEG        1953  . HAND SURGERY     RIGHT  . HERNIA REPAIR     1-right inguinal, 3- left inguinal  .  IRRIGATION AND DEBRIDEMENT KNEE Left 07/09/2015   Procedure: LEFT KNEE IRRIGATION AND DEBRIDEMENT WOUND CLOSURE;  Surgeon: Gaynelle Arabian, MD;  Location: [facility];  Service: Orthopedics;  Laterality: Left;  . JOINT REPLACEMENT  02/28/2016   lt knee revision  . KNEE DEBRIDEMENT     2016   LEFT  . MENISCUS REPAIR     LEFT  . SHOULDER OPEN ROTATOR CUFF REPAIR     RT SHOULDER  . SPINAL FUSION    . SPINAL FUSION    . TONSILLECTOMY    . TOTAL KNEE ARTHROPLASTY Left 05/28/2015   Procedure: LEFT TOTAL KNEE ARTHROPLASTY;  Surgeon: Gaynelle Arabian, MD;  Location[facility];  Service: Orthopedics;  Laterality: Left;  . TOTAL KNEE REVISION Left 02/28/2016   Procedure: LEFT TOTAL KNEE REVISION;  Surgeon: Leandrew Koyanagi, MD;  Location: [place];  Service: Orthopedics;  Laterality: Left;  . TRIGGER FINGER RELEASE     RT HAND   1990S  . UVULECTOMY  2004   TURBINATE,TONSIL,ADENOID    Current Outpatient Prescriptions  Medication Sig Dispense Refill  . aspirin EC 81 MG tablet Take 81 mg by mouth every morning.    . Cholecalciferol (VITAMIN D3) 5000 units CAPS Take  5,000 Units by mouth at bedtime.    . clobetasol (TEMOVATE) 0.05 % external solution Apply 1 application topically 2 (two) times daily as needed.  2  . diclofenac sodium (VOLTAREN) 1 % GEL Apply 2 g topically daily as needed (for pain). 2 g 1  . escitalopram (LEXAPRO) 10 MG tablet Take 10 mg by mouth at bedtime.   1  . fludrocortisone (FLORINEF) 0.1 MG tablet Take 0.1 mg by mouth every morning.     . fluticasone (FLONASE) 50 MCG/ACT nasal spray Place 1 spray into both nostrils 2 (two) times daily as needed for allergies.   1  . predniSONE (DELTASONE) 5 MG tablet Take 5 mg by mouth every Tuesday.   1  . pregabalin (LYRICA) 50 MG capsule Take 50 mg by mouth every evening.     . tamsulosin (FLOMAX) 0.4 MG CAPS capsule Take 0.4 mg by mouth every evening.    . venlafaxine XR (EFFEXOR-XR) 37.5 MG 24 hr capsule Take 1 capsule by mouth every morning.   3  .  zolpidem (AMBIEN) 5 MG tablet Take 1-2 mg by mouth at bedtime. Takes only if he doesn't take clonazepam  1   No current facility-administered medications for this visit.    Allergies:  Calan [verapamil]; Cymbalta [duloxetine hcl]; Iodinated diagnostic agents; Iodine; Latex; Lisinopril; Neurontin [gabapentin]; Zoloft [sertraline hcl]; Protonix [pantoprazole sodium]; Valium [diazepam]; Flomax [tamsulosin hcl]; Glimepiride; Melatonin; and Tape   Social History: The patient  reports that he quit smoking about 30 years ago. His smoking use included Cigarettes. He started smoking about 58 years ago. He has a 1.80 pack-year smoking history. He has never used smokeless tobacco. He reports that he does not drink alcohol or use drugs.   ROS:  Please see the history of present illness. Otherwise, complete review of systems is positive for hearing loss, neuropathy and arthritic pains.  All other systems are reviewed and negative.   Physical Exam: VS:  BP (!) 160/70   Pulse 69   Ht 6' 0.6" (1.844 m)   Wt 220 lb (99.8 kg)   SpO2 97%   BMI 29.35 kg/m , BMI Body mass index is 29.35 kg/m.  Wt Readings from Last 3 Encounters:  09/21/17 220 lb (99.8 kg)  04/18/17 216 lb (98 kg)  01/20/17 210 lb (95.3 kg)    General: Elderly male, appears comfortable at rest. Using a cane. HEENT: Conjunctiva and lids normal, oropharynx clear. Neck: Supple, no elevated JVP or carotid bruits, no thyromegaly. Lungs: Clear to auscultation, nonlabored breathing at rest. Cardiac: Regular rate and rhythm, no S3, 2/6 systolic murmur, no pericardial rub. Abdomen: Soft, nontender, bowel sounds present, no guarding or rebound. Extremities: Trace ankle edema, distal pulses 2+.  ECG: I personally reviewed the tracing from 07/16/2016 which showed sinus rhythm with prolonged PR interval, leftward axis rule out old inferior infarct pattern.  Recent Labwork: 01/20/2017: ALT 28; AST 29 04/18/2017: BUN 18; Creatinine, Ser 1.03;  Hemoglobin 14.1; Platelets 205; Potassium 3.8; Sodium 142     Component Value Date/Time   CHOL 181 12/20/2015 0705   TRIG 177 (H) 12/20/2015 0705   HDL 36 (L) 12/20/2015 0705   CHOLHDL 5.0 12/20/2015 0705   VLDL 35 12/20/2015 0705   LDLCALC 110 (H) 12/20/2015 0705    Other Studies Reviewed Today:  Echocardiogram 07/18/2016: Study Conclusions  - Left ventricle: The cavity size was normal. Wall thickness was   normal. Systolic function was vigorous. The estimated ejection   fraction was in the  range of 65% to 70%. Doppler parameters are   consistent with abnormal left ventricular relaxation (grade 1   diastolic dysfunction). - Aortic valve: Moderately calcified annulus. Trileaflet;   moderately thickened leaflets. There was mild regurgitation. Mean   gradient (S): 11 mm Hg. Valve area (VTI): 2.08 cm^2. Valve area   (Vmax): 2.16 cm^2. Valve area (Vmean): 2.39 cm^2. - Mitral valve: Mildly calcified annulus. Mildly thickened leaflets   . There was mild regurgitation. - Atrial septum: No defect or patent foramen ovale was identified. - Technically difficult study.  Assessment and Plan:  1. Orthostatic intolerance, doing relatively well on Florinef with autonomic dysfunction and moderate vertebrobasilar insufficiency. He does not report any syncope or recent falls. Using a cane.  2. Hyperlipidemia, managing by diet. He follows with Dr. Anastasio Champion.  Current medicines were reviewed with the patient today.   Orders Placed This Encounter  Procedures  . EKG 12-Lead    Disposition: Follow-up in 6 months.  Signed, [name], MD, [facility] 09/21/2017 3:49 PM    [facility] [place], [place], [place] [address] Phone: [phone]; Fax: [phone]

## 2017-09-22 ENCOUNTER — Ambulatory Visit (INDEPENDENT_AMBULATORY_CARE_PROVIDER_SITE_OTHER): Payer: Medicare Other | Admitting: Urology

## 2017-09-22 ENCOUNTER — Other Ambulatory Visit (HOSPITAL_COMMUNITY)
Admission: RE | Admit: 2017-09-22 | Discharge: 2017-09-22 | Disposition: A | Discharge status: Home / Self Care | Payer: Medicare Other | Source: Other Acute Inpatient Hospital | Attending: Urology | Admitting: Urology

## 2017-09-22 DIAGNOSIS — R311 Benign essential microscopic hematuria: Secondary | ICD-10-CM | POA: Diagnosis present

## 2017-09-22 DIAGNOSIS — R972 Elevated prostate specific antigen [PSA]: Secondary | ICD-10-CM

## 2017-09-22 DIAGNOSIS — N401 Enlarged prostate with lower urinary tract symptoms: Secondary | ICD-10-CM | POA: Diagnosis not present

## 2017-09-22 DIAGNOSIS — R351 Nocturia: Secondary | ICD-10-CM | POA: Diagnosis not present

## 2017-09-22 LAB — URINALYSIS, COMPLETE (UACMP) WITH MICROSCOPIC
BILIRUBIN URINE: NEGATIVE
Bacteria, UA: NONE SEEN
GLUCOSE, UA: NEGATIVE mg/dL
KETONES UR: NEGATIVE mg/dL
LEUKOCYTES UA: NEGATIVE
NITRITE: NEGATIVE
PH: 6 (ref 5.0–8.0)
Protein, ur: 30 mg/dL — AB
Specific Gravity, Urine: 1.019 (ref 1.005–1.030)
Squamous Epithelial / LPF: NONE SEEN

## 2017-11-26 ENCOUNTER — Encounter (INDEPENDENT_AMBULATORY_CARE_PROVIDER_SITE_OTHER): Payer: Self-pay | Admitting: *Deleted

## 2017-11-26 ENCOUNTER — Ambulatory Visit (INDEPENDENT_AMBULATORY_CARE_PROVIDER_SITE_OTHER): Payer: Medicare Other | Admitting: Internal Medicine

## 2017-11-26 ENCOUNTER — Encounter (INDEPENDENT_AMBULATORY_CARE_PROVIDER_SITE_OTHER): Payer: Self-pay

## 2017-11-26 ENCOUNTER — Telehealth (INDEPENDENT_AMBULATORY_CARE_PROVIDER_SITE_OTHER): Payer: Self-pay | Admitting: *Deleted

## 2017-11-26 ENCOUNTER — Encounter (INDEPENDENT_AMBULATORY_CARE_PROVIDER_SITE_OTHER): Payer: Self-pay | Admitting: Internal Medicine

## 2017-11-26 VITALS — BP 120/62 | HR 64 | Temp 98.0°F | Ht 74.0 in | Wt 215.4 lb

## 2017-11-26 DIAGNOSIS — K625 Hemorrhage of anus and rectum: Secondary | ICD-10-CM

## 2017-11-26 HISTORY — DX: Hemorrhage of anus and rectum: K62.5

## 2017-11-26 MED ORDER — PEG 3350-KCL-NA BICARB-NACL 420 G PO SOLR: 4000.0000 mL | Freq: Once | ORAL | 0 refills | Status: AC

## 2017-11-26 NOTE — Progress Notes (Signed)
Subjective:    Patient ID: Jared Bridges, male    DOB: [birth year], 83 y.o.   MRN: [id number]  HPI Referred by Dr. Sharren Bridge for rectal bleeding. He has been bleeding for 2-3 months. He sees blood on the toilet tissue. He describes the blood as a lot. When he has a BM,  The water turns brown. Stools are very loose. Symptoms for 2-3 months.  Sometimes he may go 2-3 days without having a  BM  Appetite is good. No weight loss. Had a colon resection in 1988 for diverticulitis.  Takes ASA daily. No other NSAIDs.       His last colonoscopy was in 2014 Dr. Malissa Hippo. ([facility]) Biopsy:Ascending colon polyp: fragments of tubular adenoma. No high grade dysplasia.  Transverse colon polyp: Tubular adenoma.    Review of Systems Past Medical History:  Diagnosis Date  . Anemia, iron deficiency   . Chronic headaches    Migraines  . Chronic kidney disease    Resolved  . Depression   . Diverticulitis   . Essential hypertension   . GI bleed    Dr. Laural Golden - 1998  . Hypercholesteremia   . Orthostatic hypotension   . Peptic ulcer disease   . Peripheral neuropathy   . Polymyalgia rheumatica ([place])   . Stroke [facility])    Residual trouble reading and writing  . Syncope   . Thyroid nodule   . Type 2 diabetes mellitus ([place])     Past Surgical History:  Procedure Laterality Date  . APPENDECTOMY     as child  . BACK SURGERY     2000 IDET SPINAL PROC  . CHOLECYSTECTOMY N/A 04/07/2016   Procedure: CHOLECYSTECTOMY;  Surgeon: Aviva Signs, MD;  Location: [facility];  Service: General;  Laterality: N/A;  . [place]  . CRYOTHERAPY    . EYE SURGERY     right cataract with lens implant  . FEMUR SURGERY     RT LEG        1953  . HAND SURGERY     RIGHT  . HERNIA REPAIR     1-right inguinal, 3- left inguinal  . IRRIGATION AND DEBRIDEMENT KNEE Left 07/09/2015   Procedure: LEFT KNEE IRRIGATION AND DEBRIDEMENT WOUND CLOSURE;  Surgeon: Gaynelle Arabian, MD;  [facility];   Service: Orthopedics;  Laterality: Left;  . JOINT REPLACEMENT  02/28/2016   lt knee revision  . KNEE DEBRIDEMENT     2016   LEFT  . MENISCUS REPAIR     LEFT  . SHOULDER OPEN ROTATOR CUFF REPAIR     RT SHOULDER  . SPINAL FUSION    . SPINAL FUSION    . TONSILLECTOMY    . TOTAL KNEE ARTHROPLASTY Left 05/28/2015   Procedure: LEFT TOTAL KNEE ARTHROPLASTY;  Surgeon: Gaynelle Arabian, MD;  [facility];  Service: Orthopedics;  Laterality: Left;  . TOTAL KNEE REVISION Left 02/28/2016   Procedure: LEFT TOTAL KNEE REVISION;  Surgeon: Leandrew Koyanagi, MD;  Location: [place];  Service: Orthopedics;  Laterality: Left;  . TRIGGER FINGER RELEASE     RT HAND   1990S  . UVULECTOMY  2004   TURBINATE,TONSIL,ADENOID            Objective:   Physical Exam Blood pressure 120/62, pulse 64, temperature 98 F (36.7 C), height 6\' 2"  (1.88 m), weight 215 lb 6.4 oz (97.7 kg).  Alert and oriented. Skin warm and dry. Oral  mucosa is moist.   . Sclera anicteric, conjunctivae is pink. Thyroid not enlarged. No cervical lymphadenopathy. Lungs clear. Heart regular rate and rhythm.  Abdomen is soft. Bowel sounds are positive. No hepatomegaly. No abdominal masses felt. No tenderness.  No edema to lower extremities.Stool brown and guaiac negative. No recall masses felt.         Assessment & Plan:  Rectal bleeding. Colonic neoplasm, polyp, AVM, hemorrhoid needs to be ruled out. Colonoscopy. The risks of bleeding, perforation and infection were reviewed with patient.

## 2017-11-26 NOTE — Patient Instructions (Signed)
The risks of bleeding, perforation and infection were reviewed with patient.  

## 2017-11-26 NOTE — Telephone Encounter (Signed)
Patient needs trilyte 

## 2017-12-01 DIAGNOSIS — G459 Transient cerebral ischemic attack, unspecified: Secondary | ICD-10-CM

## 2017-12-01 HISTORY — DX: Transient cerebral ischemic attack, unspecified: G45.9

## 2017-12-25 ENCOUNTER — Ambulatory Visit (INDEPENDENT_AMBULATORY_CARE_PROVIDER_SITE_OTHER): Payer: Medicare Other | Admitting: Urology

## 2017-12-25 DIAGNOSIS — R351 Nocturia: Secondary | ICD-10-CM

## 2017-12-25 DIAGNOSIS — N401 Enlarged prostate with lower urinary tract symptoms: Secondary | ICD-10-CM | POA: Diagnosis not present

## 2017-12-25 DIAGNOSIS — R311 Benign essential microscopic hematuria: Secondary | ICD-10-CM | POA: Diagnosis not present

## 2017-12-28 ENCOUNTER — Emergency Department (HOSPITAL_COMMUNITY): Payer: Medicare Other

## 2017-12-28 ENCOUNTER — Encounter (HOSPITAL_COMMUNITY): Payer: Self-pay | Admitting: *Deleted

## 2017-12-28 ENCOUNTER — Observation Stay (HOSPITAL_COMMUNITY)
Admission: EM | Admit: 2017-12-28 | Discharge: 2017-12-29 | Disposition: A | Discharge status: Home / Self Care | Payer: Medicare Other | Attending: Internal Medicine | Admitting: Internal Medicine

## 2017-12-28 ENCOUNTER — Other Ambulatory Visit: Payer: Self-pay

## 2017-12-28 DIAGNOSIS — Z8673 Personal history of transient ischemic attack (TIA), and cerebral infarction without residual deficits: Secondary | ICD-10-CM | POA: Insufficient documentation

## 2017-12-28 DIAGNOSIS — I10 Essential (primary) hypertension: Secondary | ICD-10-CM | POA: Diagnosis present

## 2017-12-28 DIAGNOSIS — R2689 Other abnormalities of gait and mobility: Secondary | ICD-10-CM | POA: Insufficient documentation

## 2017-12-28 DIAGNOSIS — I129 Hypertensive chronic kidney disease with stage 1 through stage 4 chronic kidney disease, or unspecified chronic kidney disease: Secondary | ICD-10-CM | POA: Insufficient documentation

## 2017-12-28 DIAGNOSIS — Z91040 Latex allergy status: Secondary | ICD-10-CM | POA: Diagnosis not present

## 2017-12-28 DIAGNOSIS — G459 Transient cerebral ischemic attack, unspecified: Secondary | ICD-10-CM | POA: Diagnosis not present

## 2017-12-28 DIAGNOSIS — F339 Major depressive disorder, recurrent, unspecified: Secondary | ICD-10-CM | POA: Diagnosis present

## 2017-12-28 DIAGNOSIS — E119 Type 2 diabetes mellitus without complications: Secondary | ICD-10-CM

## 2017-12-28 DIAGNOSIS — N189 Chronic kidney disease, unspecified: Secondary | ICD-10-CM | POA: Diagnosis not present

## 2017-12-28 DIAGNOSIS — R079 Chest pain, unspecified: Secondary | ICD-10-CM | POA: Diagnosis not present

## 2017-12-28 DIAGNOSIS — H532 Diplopia: Principal | ICD-10-CM | POA: Insufficient documentation

## 2017-12-28 DIAGNOSIS — Z87891 Personal history of nicotine dependence: Secondary | ICD-10-CM | POA: Insufficient documentation

## 2017-12-28 DIAGNOSIS — F329 Major depressive disorder, single episode, unspecified: Secondary | ICD-10-CM | POA: Diagnosis present

## 2017-12-28 DIAGNOSIS — F419 Anxiety disorder, unspecified: Secondary | ICD-10-CM | POA: Diagnosis present

## 2017-12-28 DIAGNOSIS — Z79899 Other long term (current) drug therapy: Secondary | ICD-10-CM | POA: Diagnosis not present

## 2017-12-28 DIAGNOSIS — F32A Depression, unspecified: Secondary | ICD-10-CM | POA: Diagnosis present

## 2017-12-28 DIAGNOSIS — E1122 Type 2 diabetes mellitus with diabetic chronic kidney disease: Secondary | ICD-10-CM | POA: Insufficient documentation

## 2017-12-28 DIAGNOSIS — G43909 Migraine, unspecified, not intractable, without status migrainosus: Secondary | ICD-10-CM | POA: Diagnosis present

## 2017-12-28 DIAGNOSIS — E7800 Pure hypercholesterolemia, unspecified: Secondary | ICD-10-CM | POA: Diagnosis present

## 2017-12-28 DIAGNOSIS — E079 Disorder of thyroid, unspecified: Secondary | ICD-10-CM | POA: Diagnosis present

## 2017-12-28 DIAGNOSIS — M353 Polymyalgia rheumatica: Secondary | ICD-10-CM | POA: Diagnosis present

## 2017-12-28 LAB — I-STAT CHEM 8, ED
BUN: 19 mg/dL (ref 6–20)
CHLORIDE: 105 mmol/L (ref 101–111)
Calcium, Ion: 1.2 mmol/L (ref 1.15–1.40)
Creatinine, Ser: 1.1 mg/dL (ref 0.61–1.24)
Glucose, Bld: 187 mg/dL — ABNORMAL HIGH (ref 65–99)
HEMATOCRIT: 42 % (ref 39.0–52.0)
Hemoglobin: 14.3 g/dL (ref 13.0–17.0)
Potassium: 4.1 mmol/L (ref 3.5–5.1)
SODIUM: 141 mmol/L (ref 135–145)
TCO2: 26 mmol/L (ref 22–32)

## 2017-12-28 LAB — APTT: aPTT: 31 seconds (ref 24–36)

## 2017-12-28 LAB — COMPREHENSIVE METABOLIC PANEL
ALK PHOS: 78 U/L (ref 38–126)
ALT: 19 U/L (ref 17–63)
ANION GAP: 8 (ref 5–15)
AST: 20 U/L (ref 15–41)
Albumin: 3.8 g/dL (ref 3.5–5.0)
BUN: 18 mg/dL (ref 6–20)
CALCIUM: 9.3 mg/dL (ref 8.9–10.3)
CO2: 24 mmol/L (ref 22–32)
Chloride: 106 mmol/L (ref 101–111)
Creatinine, Ser: 1.19 mg/dL (ref 0.61–1.24)
GFR calc Af Amer: >60 mL/min (ref >60)
GFR calc non Af Amer: 54 mL/min — ABNORMAL LOW (ref >60)
GLUCOSE: 194 mg/dL — AB (ref 65–99)
Potassium: 4.1 mmol/L (ref 3.5–5.1)
SODIUM: 138 mmol/L (ref 135–145)
Total Bilirubin: 0.4 mg/dL (ref 0.3–1.2)
Total Protein: 7.2 g/dL (ref 6.5–8.1)

## 2017-12-28 LAB — CBC
HCT: 42.4 % (ref 39.0–52.0)
Hemoglobin: 13.6 g/dL (ref 13.0–17.0)
MCH: 31.8 pg (ref 26.0–34.0)
MCHC: 32.1 g/dL (ref 30.0–36.0)
MCV: 99.1 fL (ref 78.0–100.0)
PLATELETS: 210 10*3/uL (ref 150–400)
RBC: 4.28 MIL/uL (ref 4.22–5.81)
RDW: 13.3 % (ref 11.5–15.5)
WBC: 8.5 10*3/uL (ref 4.0–10.5)

## 2017-12-28 LAB — DIFFERENTIAL
Basophils Absolute: 0 10*3/uL (ref 0.0–0.1)
Basophils Relative: 0 %
EOS PCT: 5 %
Eosinophils Absolute: 0.4 10*3/uL (ref 0.0–0.7)
LYMPHS PCT: 21 %
Lymphs Abs: 1.7 10*3/uL (ref 0.7–4.0)
Monocytes Absolute: 0.9 10*3/uL (ref 0.1–1.0)
Monocytes Relative: 10 %
NEUTROS ABS: 5.4 10*3/uL (ref 1.7–7.7)
NEUTROS PCT: 64 %

## 2017-12-28 LAB — ETHANOL: Alcohol, Ethyl (B): <10 mg/dL (ref <10)

## 2017-12-28 LAB — PROTIME-INR
INR: 0.98
PROTHROMBIN TIME: 12.9 s (ref 11.4–15.2)

## 2017-12-28 LAB — TROPONIN I: Troponin I: <0.03 ng/mL (ref <0.03)

## 2017-12-28 MED ORDER — ASPIRIN 81 MG PO CHEW
324.0000 mg | CHEWABLE_TABLET | Freq: Once | ORAL | Status: AC
  Administered 2017-12-28: 324 mg via ORAL
  Filled 2017-12-28: qty 4

## 2017-12-28 NOTE — ED Triage Notes (Addendum)
Pt brought in by [facility] for c/o double vision that started x 45 mins ago; pt states he was reading a book when the double vision started; pt states he woke up with a headache this am and was suppose to have an eye appointment at [facility] but had to cancel it because of the headache; pt is also c/o some mid sternal chest pain that radiates under his left breast; pt states he only has the pain if he takes a deep breath or lays on that side;

## 2017-12-28 NOTE — ED Provider Notes (Signed)
[facility] EMERGENCY DEPARTMENT Provider Note   CSN: [id number] Arrival date & time: 12/28/17  2040     History   Chief Complaint Chief Complaint  Patient presents with  . Diplopia    HPI Jared Bridges is a 84 y.o. male.  Patient with a history of migraines and focal findings with his migraines in the past.  Patient also had MRI documenting a CVA in 2017.  Patient woke up this morning and had a headache not severe resolved.  Then 45 minutes prior to arrival he was reading a book and noticed that he was seeing double.  Looked at the TV and saw double.  Got up was a little off balance according to his wife.  Patient denied any motor weakness or speech problems.  Denied any significant headache has a very mild ache but that is not unusual for him.  Patient came in here appropriately upon arrival here the double vision had resolved.  Patient's had chest pain in the left anterior lower chest area for about 2 weeks.  There was a fall about a week prior to that.  The pain is been there daily.      Past Medical History:  Diagnosis Date  . Anemia, iron deficiency   . Chronic headaches    Migraines  . Chronic kidney disease    Resolved  . Depression   . Diverticulitis   . Essential hypertension   . GI bleed    Dr. Laural Golden - 1998  . Hypercholesteremia   . Orthostatic hypotension   . Peptic ulcer disease   . Peripheral neuropathy   . Polymyalgia rheumatica ([place])   . Stroke [facility])    Residual trouble reading and writing  . Syncope   . Thyroid nodule   . Type 2 diabetes mellitus [facility])     Patient Active Problem List   Diagnosis Date Noted  . Rectal bleeding 11/26/2017  . Acute cholecystitis 04/05/2016  . 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 Total knee replacement status 02/28/2016  . CVA (cerebral infarction) 12/19/2015  . Acute CVA (cerebrovascular accident) ([place]) 12/19/2015  . Carotid stenosis 12/19/2015  . Postoperative wound dehiscence 07/09/2015    . Type 2 diabetes mellitus, uncontrolled ([place]) 06/29/2015  . Cellulitis of knee, left 06/29/2015  . Wound infection after surgery 06/29/2015  . OA (osteoarthritis) of knee 05/28/2015  . BRBPR (bright red blood per rectum) 11/21/2014  . Orthostatic hypotension 11/21/2014  . Dizziness 11/16/2014  . Polymyalgia rheumatica ([place]) 09/12/2014  . Anemia, iron deficiency 08/16/2014  . DJD (degenerative joint disease) 08/16/2014  . Essential hypertension   . Type 2 diabetes mellitus ([place])   . Cataract, nuclear 04/11/2014  . Complaining of back-related symptom 03/15/2014  . Arthralgia of hip or thigh 03/15/2014  . Gonalgia 10/10/2013  . 1st degree AV block 08/04/2013  . Infection of the upper respiratory tract 04/05/2013  . Cataract 02/24/2013  . Pseudoaphakia 02/24/2013  . Episode of syncope 01/11/2013  . Cardiac murmur 11/09/2012  . Near syncope 11/09/2012  . Anxiety 06/18/2012  . Clinical depression 06/18/2012  . Bleeding gastrointestinal 06/18/2012  . Diverticulitis 06/18/2012  . Difficulty hearing 06/18/2012  . Adaptive colitis 06/18/2012  . Arthritis, degenerative 06/18/2012  . Disease of thyroid gland 06/18/2012  . Multinodular goiter 01/29/2012  . 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 Adenoma of large intestine 05/31/2011  . Cephalalgia 05/31/2011  . Hypercholesterolemia 05/31/2011  . Benign fibroma of prostate 05/31/2011  . LBP (low back pain) 05/31/2011  .  Diabetes mellitus ([place]) 05/31/2011  . Essential (primary) hypertension 05/31/2011    Past Surgical History:  Procedure Laterality Date  . APPENDECTOMY     as child  . BACK SURGERY     2000 IDET SPINAL PROC  . CHOLECYSTECTOMY N/A 04/07/2016   Procedure: CHOLECYSTECTOMY;  Surgeon: Aviva Signs, MD;  Location: [facility];  Service: General;  Laterality: N/A;  . [place]  . CRYOTHERAPY    . EYE SURGERY     right cataract with lens implant  . FEMUR SURGERY     RT LEG        1953  . HAND SURGERY      RIGHT  . HERNIA REPAIR     1-right inguinal, 3- left inguinal  . IRRIGATION AND DEBRIDEMENT KNEE Left 07/09/2015   Procedure: LEFT KNEE IRRIGATION AND DEBRIDEMENT WOUND CLOSURE;  Surgeon: Gaynelle Arabian, MD;  Location: [facility];  Service: Orthopedics;  Laterality: Left;  . JOINT REPLACEMENT  02/28/2016   lt knee revision  . KNEE DEBRIDEMENT     2016   LEFT  . MENISCUS REPAIR     LEFT  . SHOULDER OPEN ROTATOR CUFF REPAIR     RT SHOULDER  . SPINAL FUSION    . SPINAL FUSION    . TONSILLECTOMY    . TOTAL KNEE ARTHROPLASTY Left 05/28/2015   Procedure: LEFT TOTAL KNEE ARTHROPLASTY;  Surgeon: Gaynelle Arabian, MD;  Location: [facility];  Service: Orthopedics;  Laterality: Left;  . TOTAL KNEE REVISION Left 02/28/2016   Procedure: LEFT TOTAL KNEE REVISION;  Surgeon: Leandrew Koyanagi, MD;  Location: [facility];  Service: Orthopedics;  Laterality: Left;  . TRIGGER FINGER RELEASE     RT HAND   1990S  . UVULECTOMY  2004   TURBINATE,TONSIL,ADENOID       Home Medications    Prior to Admission medications   Medication Sig Start Date End Date Taking? Authorizing Provider  Apremilast (OTEZLA) 10 & 20 & 30 MG TBPK Take 10-30 mg by mouth See admin instructions. Take 10 mg by mouth daily on day 1, take 10 mg by mouth twice daily on day 2, take 10 mg by mouth in the morning and take 20 mg by mouth in the evening on day 3, take 20 mg by mouth twice daily on day 4, take 20 mg by mouth in the morning and take 30 mg by mouth in the evening on day 5, take 30 mg by mouth twice daily on day 6, then continue 30 mg by mouth twice daily   Yes [provider]  bismuth subsalicylate (PEPTO BISMOL) 262 MG/15ML suspension Take 30 mLs by mouth every 6 (six) hours as needed for diarrhea or loose stools.   Yes [provider]  Cholecalciferol (VITAMIN D3) 5000 units CAPS Take 5,000 Units by mouth at bedtime.   Yes [provider]  diclofenac sodium (VOLTAREN) 1 % GEL Apply 2 g topically daily as needed (for  pain). 11/22/15  Yes Dohmeier, Asencion Partridge, MD  escitalopram (LEXAPRO) 10 MG tablet Take 10 mg by mouth at bedtime.  01/31/16  Yes [provider]  fluocinonide cream (LIDEX) 5.32 % Apply 1 application topically daily as needed (for irritation).    Yes [provider]  hydrocortisone 2.5 % cream Apply 1 application topically 2 (two) times daily as needed (for psoriasis).   Yes [provider]  omeprazole (PRILOSEC) 20 MG capsule Take 20 mg by mouth daily.  Yes [provider]  pantoprazole (PROTONIX) 20 MG tablet Take 20 mg by mouth daily.    Yes [provider]  predniSONE (DELTASONE) 5 MG tablet Take 5 mg by mouth every Tuesday.  10/03/14  Yes [provider]  pregabalin (LYRICA) 50 MG capsule Take 50 mg by mouth at bedtime.    Yes [provider]  topiramate (TOPAMAX) 25 MG tablet Take 25 mg by mouth 2 (two) times daily.   Yes [provider]  venlafaxine XR (EFFEXOR-XR) 37.5 MG 24 hr capsule Take 37.5 mg by mouth every morning.  08/15/16  Yes [provider]  zolpidem (AMBIEN) 5 MG tablet Take 5-10 mg by mouth at bedtime.  08/18/16  Yes [provider]  fluticasone (FLONASE) 50 MCG/ACT nasal spray Place 2 sprays into both nostrils daily as needed for allergies.  01/07/16   [provider]  thyroid (NP THYROID) 15 MG tablet Take 15 mg by mouth daily before breakfast.    [provider]    Family History Family History  Problem Relation Age of Onset  . Colon cancer Sister   . Hypertension Unknown   . Depression Brother   . Depression Daughter     Social History Social History   Tobacco Use  . Smoking status: Former Smoker    Packs/day: 0.10    Years: 18.00    Pack years: 1.80    Types: Cigarettes    Start date: 12/18/1958    Last attempt to quit: 12/01/1986    Years since quitting: 31.0  . Smokeless tobacco: Never Used  Substance Use Topics  . Alcohol use: No    Alcohol/week: 0.0 oz    . Drug use: No     Allergies   Iodine; [place]an [verapamil]; Cymbalta [duloxetine hcl]; Iodinated diagnostic agents; Latex; Lisinopril; Neurontin [gabapentin]; Zoloft [sertraline hcl]; Valium [diazepam]; Flomax [tamsulosin hcl]; Glimepiride; Melatonin; and Tape   Review of Systems Review of Systems  Constitutional: Negative for fever.  HENT: Negative for congestion.   Eyes: Positive for visual disturbance. Negative for photophobia.  Respiratory: Negative for shortness of breath.   Cardiovascular: Positive for chest pain. Negative for leg swelling.  Gastrointestinal: Negative for abdominal pain.  Genitourinary: Negative for dysuria.  Musculoskeletal: Negative for back pain.  Neurological: Positive for headaches.  Hematological: Bruises/bleeds easily.  Psychiatric/Behavioral: Negative for confusion.     Physical Exam Updated Vital Signs BP (!) 147/62 (BP Location: Right Arm)   Pulse 67   Temp 98.1 F (36.7 C)   Resp 18   Ht 1.854 m (6\' 1" )   Wt 95.3 kg (210 lb)   SpO2 97%   BMI 27.71 kg/m   Physical Exam  Constitutional: He is oriented to person, place, and time. He appears well-developed and well-nourished. No distress.  HENT:  Head: Normocephalic and atraumatic.  Mouth/Throat: Oropharynx is clear and moist.  Eyes: Conjunctivae and EOM are normal. Pupils are equal, round, and reactive to light.  Neck: Normal range of motion. Neck supple.  Cardiovascular: Normal rate, regular rhythm and normal heart sounds.  Pulmonary/Chest: Effort normal and breath sounds normal. No respiratory distress. He exhibits no tenderness.  Abdominal: Soft. Bowel sounds are normal. There is no tenderness.  Musculoskeletal: Normal range of motion. He exhibits no edema.  Neurological: He is alert and oriented to person, place, and time. No cranial nerve deficit or sensory deficit. He exhibits normal muscle tone. Coordination normal.  Visual fields grossly intact.  Eyes track normally.  No  nystagmus.  No motor weakness.  Coordination intact.  Finger to nose.  Nursing note and vitals reviewed.    ED Treatments / Results  Labs (all labs ordered are listed, but only abnormal results are displayed) Labs Reviewed  COMPREHENSIVE METABOLIC PANEL - Abnormal; Notable for the following components:      Result Value   Glucose, Bld 194 (*)    GFR calc non Af Amer 54 (*)    All other components within normal limits  I-STAT CHEM 8, ED - Abnormal; Notable for the following components:   Glucose, Bld 187 (*)    All other components within normal limits  ETHANOL  PROTIME-INR  APTT  CBC  DIFFERENTIAL  TROPONIN I  RAPID URINE DRUG SCREEN, HOSP PERFORMED  URINALYSIS, ROUTINE W REFLEX MICROSCOPIC  I-STAT TROPONIN, ED    EKG  EKG Interpretation  Date/Time:  Monday December 28 2017 21:56:02 EST Ventricular Rate:  62 PR Interval:    QRS Duration: 92 QT Interval:  439 QTC Calculation: 446 R Axis:   -37 Text Interpretation:  Sinus rhythm Prolonged PR interval Abnormal R-wave progression, early transition Inferior infarct, old Consider anterior infarct No significant change since last tracing Confirmed by Fredia Sorrow [phone]) on 12/28/2017 10:11:44 PM       Radiology Dg Chest 2 View  Result Date: 12/28/2017 CLINICAL DATA:  Chest pain EXAM: CHEST  2 VIEW COMPARISON:  04/07/2016 FINDINGS: Mild cardiomegaly. Lungs are clear. No effusions or acute bony abnormality. Degenerative changes in the thoracic spine. IMPRESSION: Cardiomegaly.  No active disease. Electronically Signed   By: Rolm Baptise M.D.   On: 12/28/2017 22:19   Ct Head Wo Contrast  Result Date: 12/28/2017 CLINICAL DATA:  Double vision starting 45 minutes ago. Headache this morning. EXAM: CT HEAD WITHOUT CONTRAST TECHNIQUE: Contiguous axial images were obtained from the base of the skull through the vertex without intravenous contrast. COMPARISON:  MRI brain 06/26/2016.  CT head 12/17/2015 FINDINGS: Brain: Mild diffuse  cerebral atrophy. Ventricular dilatation consistent with central atrophy. Low-attenuation changes in the deep white matter consistent with small vessel ischemia. There is a tiny hyperdense focus demonstrated in right anterior thalamus which appears to be unchanged since previous study, likely representing dystrophic calcification. No definite evidence of acute intracranial hemorrhage. No mass effect or midline shift. No abnormal extra-axial fluid collections. Gray-white matter junctions are distinct. Basal cisterns are not effaced. Vascular: Intracranial arterial vascular calcifications are present. Skull: Calvarium appears intact. Sinuses/Orbits: Paranasal sinuses and mastoid air cells are Other: None. IMPRESSION: No acute intracranial abnormalities. Mild diffuse cerebral atrophy and small vessel ischemic changes. Tiny hyperdense focus demonstrated in the right anterior thalamus appears to been present previously, likely representing a dystrophic calcification. Electronically Signed   By: Lucienne Capers M.D.   On: 12/28/2017 22:28    Procedures Procedures (including critical care time)  Medications Ordered in ED Medications  aspirin chewable tablet 324 mg (not administered)     Initial Impression / Assessment and Plan / ED Course  I have reviewed the triage vital signs and the nursing notes.  Pertinent labs & imaging results that were available during my care of the patient were reviewed by me and considered in my medical decision making (see chart for details).    Patient's double vision has resolved.  Symptoms concerning for a TIA in a patient that had a CVA in 2017.  Head CT negative.  Difficult to explain this by complicated migraine since no significant patient will be admitted by the hospitalist for  TIA.  Patient's left anterior chest pain may very well be musculoskeletal in nature.  Troponins negative and chest x-ray is negative.  The symptoms have been present for about 2 weeks.  Not  clinically concerned about an acute cardiac event.  Patient has an allergy to dye so CTA was not done tonight.  Patient will require MRI.  Code stroke was not called because symptoms had resolved upon arrival.  Hospitalist team will admit.   Final Clinical Impressions(s) / ED Diagnoses   Final diagnoses:  Diplopia  TIA (transient ischemic attack)    ED Discharge Orders    None       Fredia Sorrow, MD 12/29/17 0040

## 2017-12-28 NOTE — H&P (Signed)
History and Physical    Jared Bridges AVW:[id number] DOB: 10/03/1930 DOA: 12/28/2017  PCP: Doree Albee, MD   Patient coming from: Home.  I have personally briefly reviewed patient's old medical records in [place]  Chief Complaint: Double vision.  HPI: Jared Bridges is a 84 y.o. male with medical history significant of iron deficiency anemia, chronic migraine headaches, chronic kidney disease, depression, diverticulosis with history of diverticulitis, essential hypertension, PUD, history of GI bleed in 1998, hypercholesterolemia, orthostatic hypotension, peripheral neuropathy, type 2 diabetes, polymyalgia rheumatica, history of syncope, history of stroke with residual trouble reading and writing, thyroid nodule who is coming to the emergency department with complaints of having double vision 45 minutes before arrival while he was reading a book.  He mentions that earlier in the morning he had left-sided periorbital headache and had to cancel an ophthalmology appointment at [facility].  The headache subsequently subsided around noontime.    Things were uneventful until he had the episode of double vision in the evening while he was reading the book.  He mentions that the he looked at the TV and around the house and was still seeing double.  This caused him to have some mild gait instability, but he normally walks without assistive devices anyways.  He denies left-sided numbness, or peripheral lower extremity weakness, slurred speech or language comprehension difficulty.  He denies fever, chills or fatigue.  He complains of chest wall pain after having a fall a week before, but denies typical chest pain, palpitations, dyspnea, diaphoresis, PND, orthopnea or recent pitting edema of the lower extremities.  He has frequent postural dizziness, but he has adjusted to this and gets up slowly to avoid severe symptoms.  No productive cough, wheezing or hemoptysis.  Complains of occasional left  lower quadrant pain, but this is common after he had a partial colectomy in 1998.  He denies nausea, emesis, recent diarrhea, constipation, melena or hematochezia.  No dysuria or hematuria.  No polyphagia, polydipsia or polyuria.  He complains of easy bruising, but denies pruritus or skin rashes.  ED Course: Initial vital signs temperature 36.7C (98.1 F, pulse 67, respirations 18, blood pressure 147/62 mmHg and O2 sat 97% on room air.  Lab work shows an urinalysis showing a small hemoglobinuria and 30 mg/dL of protein, all other chemistry and microscopic results were normal.  Urine toxicology was negative.  A normal CBC.  PT/INR/PTT within normal limits.  CMP showed a blood glucose of 194 mg/dL, but all other values were normal.  Troponin level was normal.  EKG was sinus rhythm with prolonged PR interval, early transition and old inferior infarct without significant changes from previous tracing.  Imaging: Chest radiograph shows cardiomegaly, but no acute cardiopulmonary pathology.  CT head without contrast shows a previous calcification area on right anterior thalamus, atrophy and small vessel disease, but no acute intracranial pathology.  Please see images and full radiology report for further detail.  ED medications. He received 324 mg of chewable aspirin in the emergency department.  Review of Systems: As per HPI otherwise 10 point review of systems negative.    Past Medical History:  Diagnosis Date  . Anemia, iron deficiency   . Chronic headaches    Migraines  . Chronic kidney disease    Resolved  . Depression   . Diverticulitis   . Essential hypertension   . GI bleed    Dr. Laural Golden - 1998  . Hypercholesteremia   . Orthostatic hypotension   .  Peptic ulcer disease   . Peripheral neuropathy   . Polymyalgia rheumatica ([place])   . Stroke [facility])    Residual trouble reading and writing  . Syncope   . Thyroid nodule   . Type 2 diabetes mellitus ([place])     Past Surgical History:    Procedure Laterality Date  . APPENDECTOMY     as child  . BACK SURGERY     2000 IDET SPINAL PROC  . CHOLECYSTECTOMY N/A 04/07/2016   Procedure: CHOLECYSTECTOMY;  Surgeon: Aviva Signs, MD;  Location: [facility];  Service: General;  Laterality: N/A;  . [place]  . CRYOTHERAPY    . EYE SURGERY     right cataract with lens implant  . FEMUR SURGERY     RT LEG        1953  . HAND SURGERY     RIGHT  . HERNIA REPAIR     1-right inguinal, 3- left inguinal  . IRRIGATION AND DEBRIDEMENT KNEE Left 07/09/2015   Procedure: LEFT KNEE IRRIGATION AND DEBRIDEMENT WOUND CLOSURE;  Surgeon: Gaynelle Arabian, MD;  Location: [facility];  Service: Orthopedics;  Laterality: Left;  . JOINT REPLACEMENT  02/28/2016   lt knee revision  . KNEE DEBRIDEMENT     2016   LEFT  . MENISCUS REPAIR     LEFT  . SHOULDER OPEN ROTATOR CUFF REPAIR     RT SHOULDER  . SPINAL FUSION    . SPINAL FUSION    . TONSILLECTOMY    . TOTAL KNEE ARTHROPLASTY Left 05/28/2015   Procedure: LEFT TOTAL KNEE ARTHROPLASTY;  Surgeon: Gaynelle Arabian, MD;  Location: [facility];  Service: Orthopedics;  Laterality: Left;  . TOTAL KNEE REVISION Left 02/28/2016   Procedure: LEFT TOTAL KNEE REVISION;  Surgeon: Leandrew Koyanagi, MD;  Location: [place];  Service: Orthopedics;  Laterality: Left;  . TRIGGER FINGER RELEASE     RT HAND   1990S  . UVULECTOMY  2004   TURBINATE,TONSIL,ADENOID     reports that he quit smoking about 31 years ago. His smoking use included cigarettes. He started smoking about 59 years ago. He has a 1.80 pack-year smoking history. he has never used smokeless tobacco. He reports that he does not drink alcohol or use drugs.  Allergies  Allergen Reactions  . Iodine Hives, Itching and Other (See Comments)    very allergic(per daughter), can pre-med with benadryl  . Calan [Verapamil] Other (See Comments)    Weakness  . Cymbalta [Duloxetine Hcl] Other (See Comments)    Makes patient have jerking motions.  .  Iodinated Diagnostic Agents Hives, Itching and Other (See Comments)    Can pre-med with benadryl  . Latex Other (See Comments)    Redness iritation  . Lisinopril Other (See Comments)    Makes patient have jerking motions.  . Neurontin [Gabapentin] Other (See Comments)    Makes patient have jerking motions.  [name] Kitchen Zoloft [Sertraline Hcl] Diarrhea and Other (See Comments)    Makes patient have jerking motions.  . Valium [Diazepam] Other (See Comments)    "Did not work"  . Flomax [Tamsulosin Hcl] Other (See Comments)    Dizziness  . Glimepiride Other (See Comments)    "Did not work"  . Melatonin Nausea Only  . Tape Other (See Comments)    Skin irritation    Family History  Problem Relation Age of Onset  . Colon cancer Sister   . Hypertension Unknown   .  Depression Brother   . Depression Daughter     Prior to Admission medications   Medication Sig Start Date End Date Taking? Authorizing Provider  Apremilast (OTEZLA) 10 & 20 & 30 MG TBPK Take 10-30 mg by mouth See admin instructions. Take 10 mg by mouth daily on day 1, take 10 mg by mouth twice daily on day 2, take 10 mg by mouth in the morning and take 20 mg by mouth in the evening on day 3, take 20 mg by mouth twice daily on day 4, take 20 mg by mouth in the morning and take 30 mg by mouth in the evening on day 5, take 30 mg by mouth twice daily on day 6, then continue 30 mg by mouth twice daily   Yes [provider]  bismuth subsalicylate (PEPTO BISMOL) 262 MG/15ML suspension Take 30 mLs by mouth every 6 (six) hours as needed for diarrhea or loose stools.   Yes [provider]  Cholecalciferol (VITAMIN D3) 5000 units CAPS Take 5,000 Units by mouth at bedtime.   Yes [provider]  diclofenac sodium (VOLTAREN) 1 % GEL Apply 2 g topically daily as needed (for pain). 11/22/15  Yes Dohmeier, Asencion Partridge, MD  escitalopram (LEXAPRO) 10 MG tablet Take 10 mg by mouth at bedtime.  01/31/16  Yes [provider]    fluocinonide cream (LIDEX) 5.95 % Apply 1 application topically daily as needed (for irritation).    Yes [provider]  hydrocortisone 2.5 % cream Apply 1 application topically 2 (two) times daily as needed (for psoriasis).   Yes [provider]  omeprazole (PRILOSEC) 20 MG capsule Take 20 mg by mouth daily.   Yes [provider]  pantoprazole (PROTONIX) 20 MG tablet Take 20 mg by mouth daily.    Yes [provider]  predniSONE (DELTASONE) 5 MG tablet Take 5 mg by mouth every Tuesday.  10/03/14  Yes [provider]  pregabalin (LYRICA) 50 MG capsule Take 50 mg by mouth at bedtime.    Yes [provider]  topiramate (TOPAMAX) 25 MG tablet Take 25 mg by mouth 2 (two) times daily.   Yes [provider]  venlafaxine XR (EFFEXOR-XR) 37.5 MG 24 hr capsule Take 37.5 mg by mouth every morning.  08/15/16  Yes [provider]  zolpidem (AMBIEN) 5 MG tablet Take 5-10 mg by mouth at bedtime.  08/18/16  Yes [provider]  fluticasone (FLONASE) 50 MCG/ACT nasal spray Place 2 sprays into both nostrils daily as needed for allergies.  01/07/16   [provider]  thyroid (NP THYROID) 15 MG tablet Take 15 mg by mouth daily before breakfast.    [provider]    Physical Exam: Vitals:   12/28/17 2200 12/28/17 2220 12/28/17 2230 12/28/17 2300  BP: 139/62  110/86 (!) 143/57  Pulse: (!) 59  (!) 58 (!) 55  Resp: (!) 24  (!) 26 19  Temp:  98.1 F (36.7 C)    TempSrc:      SpO2: 97%  97% 97%  Weight:      Height:        Constitutional: NAD, calm, comfortable Eyes: PERRL, EOMI, lids and conjunctivae normal ENMT: Mucous membranes are moist. Posterior pharynx clear of any exudate or lesions. Neck: normal, supple, no masses, no thyromegaly Respiratory: clear to auscultation bilaterally, no wheezing, no crackles. Normal respiratory effort. No accessory muscle use.  Cardiovascular: Regular rate and rhythm, no murmurs  / rubs / gallops. No  extremity edema. 2+ pedal pulses. No carotid bruits.  Abdomen: Soft, mild LLQ tenderness, no guarding/rebound/masses palpated. No hepatosplenomegaly. Bowel sounds positive.  Musculoskeletal: no clubbing / cyanosis.  Good ROM, no contractures. Normal muscle tone.  Skin: Multiple areas of ecchymosis on upper extremities. Neurologic: CN 2-12 grossly intact. Sensation intact, DTR normal. Strength 5/5 in all 4.  Psychiatric: Normal judgment and insight. Alert and oriented x 4. Normal mood.    Labs on Admission: I have personally reviewed following labs and imaging studies  CBC: Recent Labs  Lab 12/28/17 2143 12/28/17 2202  WBC 8.5  --   NEUTROABS 5.4  --   HGB 13.6 14.3  HCT 42.4 42.0  MCV 99.1  --   PLT 210  --    Basic Metabolic Panel: Recent Labs  Lab 12/28/17 2143 12/28/17 2202  NA 138 141  K 4.1 4.1  CL 106 105  CO2 24  --   GLUCOSE 194* 187*  BUN 18 19  CREATININE 1.19 1.10  CALCIUM 9.3  --    GFR: Estimated Creatinine Clearance: 55.5 mL/min (by C-G formula based on SCr of 1.1 mg/dL). Liver Function Tests: Recent Labs  Lab 12/28/17 2143  AST 20  ALT 19  ALKPHOS 78  BILITOT 0.4  PROT 7.2  ALBUMIN 3.8   No results for input(s): LIPASE, AMYLASE in the last 168 hours. No results for input(s): AMMONIA in the last 168 hours. Coagulation Profile: Recent Labs  Lab 12/28/17 2143  INR 0.98   Cardiac Enzymes: Recent Labs  Lab 12/28/17 2143  TROPONINI <0.03   BNP (last 3 results) No results for input(s): PROBNP in the last 8760 hours. HbA1C: No results for input(s): HGBA1C in the last 72 hours. CBG: No results for input(s): GLUCAP in the last 168 hours. Lipid Profile: No results for input(s): CHOL, HDL, LDLCALC, TRIG, CHOLHDL, LDLDIRECT in the last 72 hours. Thyroid Function Tests: No results for input(s): TSH, T4TOTAL, FREET4, T3FREE, THYROIDAB in the last 72 hours. Anemia Panel: No results for input(s): VITAMINB12, FOLATE,  FERRITIN, TIBC, IRON, RETICCTPCT in the last 72 hours. Urine analysis:    Component Value Date/Time   COLORURINE YELLOW 09/22/2017 1600   APPEARANCEUR CLEAR 09/22/2017 1600   LABSPEC 1.019 09/22/2017 1600   PHURINE 6.0 09/22/2017 1600   GLUCOSEU NEGATIVE 09/22/2017 1600   HGBUR SMALL (A) 09/22/2017 1600   BILIRUBINUR NEGATIVE 09/22/2017 1600   KETONESUR NEGATIVE 09/22/2017 1600   PROTEINUR 30 (A) 09/22/2017 1600   UROBILINOGEN 1.0 05/21/2015 1050   NITRITE NEGATIVE 09/22/2017 1600   LEUKOCYTESUR NEGATIVE 09/22/2017 1600    Radiological Exams on Admission: Dg Chest 2 View  Result Date: 12/28/2017 CLINICAL DATA:  Chest pain EXAM: CHEST  2 VIEW COMPARISON:  04/07/2016 FINDINGS: Mild cardiomegaly. Lungs are clear. No effusions or acute bony abnormality. Degenerative changes in the thoracic spine. IMPRESSION: Cardiomegaly.  No active disease. Electronically Signed   By: Rolm Baptise M.D.   On: 12/28/2017 22:19   Ct Head Wo Contrast  Result Date: 12/28/2017 CLINICAL DATA:  Double vision starting 45 minutes ago. Headache this morning. EXAM: CT HEAD WITHOUT CONTRAST TECHNIQUE: Contiguous axial images were obtained from the base of the skull through the vertex without intravenous contrast. COMPARISON:  MRI brain 06/26/2016.  CT head 12/17/2015 FINDINGS: Brain: Mild diffuse cerebral atrophy. Ventricular dilatation consistent with central atrophy. Low-attenuation changes in the deep white matter consistent with small vessel ischemia. There is a tiny hyperdense focus demonstrated in right anterior thalamus which appears to be  unchanged since previous study, likely representing dystrophic calcification. No definite evidence of acute intracranial hemorrhage. No mass effect or midline shift. No abnormal extra-axial fluid collections. Gray-white matter junctions are distinct. Basal cisterns are not effaced. Vascular: Intracranial arterial vascular calcifications are present. Skull: Calvarium appears intact.  Sinuses/Orbits: Paranasal sinuses and mastoid air cells are Other: None. IMPRESSION: No acute intracranial abnormalities. Mild diffuse cerebral atrophy and small vessel ischemic changes. Tiny hyperdense focus demonstrated in the right anterior thalamus appears to been present previously, likely representing a dystrophic calcification. Electronically Signed   By: Lucienne Capers M.D.   On: 12/28/2017 22:28   07/18/2016 echocardiogram complete ------------------------------------------------------------------- LV EF: 65% -   70%  ------------------------------------------------------------------- Indications:      Aortic stenosis 424.1.  ------------------------------------------------------------------- History:   PMH:  First degree AV block.  Risk factors: Hypertension. Diabetes mellitus. Dyslipidemia.  ------------------------------------------------------------------- Study Conclusions  - Left ventricle: The cavity size was normal. Wall thickness was   normal. Systolic function was vigorous. The estimated ejection   fraction was in the range of 65% to 70%. Doppler parameters are   consistent with abnormal left ventricular relaxation (grade 1   diastolic dysfunction). - Aortic valve: Moderately calcified annulus. Trileaflet;   moderately thickened leaflets. There was mild regurgitation. Mean   gradient (S): 11 mm Hg. Valve area (VTI): 2.08 cm^2. Valve area   (Vmax): 2.16 cm^2. Valve area (Vmean): 2.39 cm^2. - Mitral valve: Mildly calcified annulus. Mildly thickened leaflets   . There was mild regurgitation. - Atrial septum: No defect or patent foramen ovale was identified. - Technically difficult study.   EKG: Independently reviewed. Vent. rate 62 BPM PR interval * ms QRS duration 92 ms QT/QTc 439/446 ms P-R-T axes -20 -37 53 Sinus rhythm Prolonged PR interval Abnormal R-wave progression, early transition Inferior infarct, old Consider anterior infarct No significant  changes when compared to previous EKGs.  Assessment/Plan Principal Problem:   TIA (transient ischemic attack) Observation/telemetry. Frequent neuro checks. Continue aspirin 81 mg p.o. daily. Check fasting lipids and hemoglobin A1c in the morning. Check echocardiogram and carotid Doppler in a.m. MR/MRA of head and neck in the morning. PT/OT evaluation. Consider further workup or neurology evaluation if symptoms persist/recur.  Active Problems:   Essential hypertension Currently not on medical therapy. Monitor blood pressure. Allow permissive hypertension in the interim.    Type 2 diabetes mellitus ([name] Last hemoglobin A1c 6.3% in May 2017. The patient stated that he believes he does not have DM. Check hemoglobin A1c in the morning. Carbohydrate modified diet. CBG monitoring before meals and bedtime. If hyperglycemia persists, start regular insulin sliding scale.    Migraine headaches Continue prophylaxis with Topamax.    Anxiety/Clinical depression Continue Lexapro 10 mg p.o. daily. Continue Effexor SR 37.5 mg p.o. daily.    Hypercholesterolemia Currently not on medical therapy. Check fasting lipid panel in the morning.    Polymyalgia rheumatica ([name]  Continue prednisone 5 mg p.o. daily.    Disease of thyroid gland Continue [place] Thyroid 50 mg p.o. daily before breakfast.    DVT prophylaxis: Lovenox SQ. Code Status: Full code. Family Communication:  Disposition Plan: Observation for TIA workup. Consults called:  Admission status: Observation/telemetry.   Reubin Milan MD [facility] Pager [phone]  If 7PM-7AM, please contact night-coverage [email or web address] Password [facility]  12/28/2017, 11:46 PM

## 2017-12-29 ENCOUNTER — Observation Stay (HOSPITAL_COMMUNITY): Payer: Medicare Other

## 2017-12-29 ENCOUNTER — Observation Stay (HOSPITAL_BASED_OUTPATIENT_CLINIC_OR_DEPARTMENT_OTHER): Payer: Medicare Other

## 2017-12-29 ENCOUNTER — Other Ambulatory Visit: Payer: Self-pay

## 2017-12-29 ENCOUNTER — Encounter (HOSPITAL_COMMUNITY): Payer: Self-pay | Admitting: *Deleted

## 2017-12-29 DIAGNOSIS — H532 Diplopia: Secondary | ICD-10-CM

## 2017-12-29 DIAGNOSIS — G459 Transient cerebral ischemic attack, unspecified: Secondary | ICD-10-CM | POA: Diagnosis not present

## 2017-12-29 DIAGNOSIS — I10 Essential (primary) hypertension: Secondary | ICD-10-CM

## 2017-12-29 DIAGNOSIS — I5030 Unspecified diastolic (congestive) heart failure: Secondary | ICD-10-CM

## 2017-12-29 DIAGNOSIS — E7800 Pure hypercholesterolemia, unspecified: Secondary | ICD-10-CM

## 2017-12-29 DIAGNOSIS — M353 Polymyalgia rheumatica: Secondary | ICD-10-CM | POA: Diagnosis not present

## 2017-12-29 DIAGNOSIS — G43909 Migraine, unspecified, not intractable, without status migrainosus: Secondary | ICD-10-CM | POA: Diagnosis present

## 2017-12-29 DIAGNOSIS — E1143 Type 2 diabetes mellitus with diabetic autonomic (poly)neuropathy: Secondary | ICD-10-CM | POA: Diagnosis not present

## 2017-12-29 LAB — URINALYSIS, ROUTINE W REFLEX MICROSCOPIC
Bacteria, UA: NONE SEEN
Bilirubin Urine: NEGATIVE
GLUCOSE, UA: NEGATIVE mg/dL
Ketones, ur: NEGATIVE mg/dL
Leukocytes, UA: NEGATIVE
NITRITE: NEGATIVE
PH: 5 (ref 5.0–8.0)
Protein, ur: 30 mg/dL — AB
Specific Gravity, Urine: 1.025 (ref 1.005–1.030)
Squamous Epithelial / LPF: NONE SEEN

## 2017-12-29 LAB — ECHOCARDIOGRAM COMPLETE
Height: 73 in
Weight: 3449.76 oz

## 2017-12-29 LAB — LIPID PANEL
CHOL/HDL RATIO: 4.7 ratio
CHOLESTEROL: 160 mg/dL (ref 0–200)
HDL: 34 mg/dL — AB (ref >40)
LDL Cholesterol: 93 mg/dL (ref 0–99)
Triglycerides: 166 mg/dL — ABNORMAL HIGH (ref <150)
VLDL: 33 mg/dL (ref 0–40)

## 2017-12-29 LAB — RAPID URINE DRUG SCREEN, HOSP PERFORMED
AMPHETAMINES: NOT DETECTED
BARBITURATES: NOT DETECTED
Benzodiazepines: NOT DETECTED
Cocaine: NOT DETECTED
Opiates: NOT DETECTED
Tetrahydrocannabinol: NOT DETECTED

## 2017-12-29 LAB — HEMOGLOBIN A1C
Hgb A1c MFr Bld: 6.8 % — ABNORMAL HIGH (ref 4.8–5.6)
MEAN PLASMA GLUCOSE: 148.46 mg/dL

## 2017-12-29 LAB — GLUCOSE, CAPILLARY
GLUCOSE-CAPILLARY: 119 mg/dL — AB (ref 65–99)
Glucose-Capillary: 211 mg/dL — ABNORMAL HIGH (ref 65–99)

## 2017-12-29 MED ORDER — TOPIRAMATE 25 MG PO TABS
25.0000 mg | ORAL_TABLET | Freq: Two times a day (BID) | ORAL | Status: DC
  Administered 2017-12-29: 25 mg via ORAL
  Filled 2017-12-29: qty 1

## 2017-12-29 MED ORDER — PANTOPRAZOLE SODIUM 20 MG PO TBEC
20.0000 mg | DELAYED_RELEASE_TABLET | Freq: Every day | ORAL | Status: DC
  Filled 2017-12-29 (×2): qty 1

## 2017-12-29 MED ORDER — ENOXAPARIN SODIUM 40 MG/0.4ML ~~LOC~~ SOLN
40.0000 mg | SUBCUTANEOUS | Status: DC
  Administered 2017-12-29: 40 mg via SUBCUTANEOUS
  Filled 2017-12-29: qty 0.4

## 2017-12-29 MED ORDER — THYROID 30 MG PO TABS
15.0000 mg | ORAL_TABLET | Freq: Every day | ORAL | Status: DC
  Administered 2017-12-29: 15 mg via ORAL
  Filled 2017-12-29 (×2): qty 1

## 2017-12-29 MED ORDER — APREMILAST 10 & 20 & 30 MG PO TBPK: 10.0000 mg | ORAL_TABLET | ORAL | Status: DC

## 2017-12-29 MED ORDER — HYDROCORTISONE 2.5 % EX CREA
1.0000 "application " | TOPICAL_CREAM | Freq: Two times a day (BID) | CUTANEOUS | Status: DC | PRN
  Filled 2017-12-29: qty 30

## 2017-12-29 MED ORDER — VENLAFAXINE HCL ER 37.5 MG PO CP24
37.5000 mg | ORAL_CAPSULE | Freq: Every morning | ORAL | Status: DC
  Administered 2017-12-29: 37.5 mg via ORAL
  Filled 2017-12-29 (×2): qty 1

## 2017-12-29 MED ORDER — PREDNISONE 10 MG PO TABS
5.0000 mg | ORAL_TABLET | ORAL | Status: DC
  Administered 2017-12-29: 5 mg via ORAL
  Filled 2017-12-29: qty 1

## 2017-12-29 MED ORDER — ASPIRIN 81 MG PO TBEC: 162.0000 mg | DELAYED_RELEASE_TABLET | Freq: Every day | ORAL | 0 refills | Status: DC

## 2017-12-29 MED ORDER — STROKE: EARLY STAGES OF RECOVERY BOOK
Freq: Once | Status: AC
  Administered 2017-12-29: 04:00:00
  Filled 2017-12-29: qty 1

## 2017-12-29 MED ORDER — ACETAMINOPHEN 650 MG RE SUPP: 650.0000 mg | RECTAL | Status: DC | PRN

## 2017-12-29 MED ORDER — ASPIRIN EC 81 MG PO TBEC
81.0000 mg | DELAYED_RELEASE_TABLET | Freq: Every day | ORAL | Status: DC
  Administered 2017-12-29: 81 mg via ORAL
  Filled 2017-12-29: qty 1

## 2017-12-29 MED ORDER — BISMUTH SUBSALICYLATE 262 MG/15ML PO SUSP
30.0000 mL | Freq: Four times a day (QID) | ORAL | Status: DC | PRN
  Filled 2017-12-29: qty 118

## 2017-12-29 MED ORDER — ACETAMINOPHEN 325 MG PO TABS: 650.0000 mg | ORAL_TABLET | ORAL | Status: DC | PRN

## 2017-12-29 MED ORDER — ESCITALOPRAM OXALATE 10 MG PO TABS: 10.0000 mg | ORAL_TABLET | Freq: Every day | ORAL | Status: DC

## 2017-12-29 MED ORDER — VITAMIN D 1000 UNITS PO TABS
5000.0000 [IU] | ORAL_TABLET | Freq: Every day | ORAL | Status: DC
  Filled 2017-12-29: qty 5

## 2017-12-29 MED ORDER — ZOLPIDEM TARTRATE 5 MG PO TABS: 5.0000 mg | ORAL_TABLET | Freq: Every day | ORAL | Status: DC

## 2017-12-29 MED ORDER — ACETAMINOPHEN 160 MG/5ML PO SOLN: 650.0000 mg | ORAL | Status: DC | PRN

## 2017-12-29 MED ORDER — PANTOPRAZOLE SODIUM 40 MG PO TBEC: 40.0000 mg | DELAYED_RELEASE_TABLET | Freq: Every day | ORAL | Status: DC

## 2017-12-29 MED ORDER — FLUTICASONE PROPIONATE 50 MCG/ACT NA SUSP: 2.0000 | Freq: Every day | NASAL | Status: DC | PRN

## 2017-12-29 MED ORDER — PREGABALIN 50 MG PO CAPS: 50.0000 mg | ORAL_CAPSULE | Freq: Every day | ORAL | Status: DC

## 2017-12-29 NOTE — Discharge Summary (Signed)
Physician Discharge Summary  Jared Bridges WUX:[id number] DOB: October 09, 1930 DOA: 12/28/2017  PCP: Doree Albee, MD  Admit date: 12/28/2017 Discharge date: 12/29/2017  Admitted From: Home Disposition: Home  Recommendations for Outpatient Follow-up:  1. Follow up with PCP in 1-2 weeks 2. Please obtain BMP/CBC in one week 3. Follow-up with neurology in 4 weeks  Home Health: Home health PT has been recommended, but patient refused Equipment/Devices:  Discharge Condition: Stable CODE STATUS: Full code Diet recommendation: Heart Healthy / Carb Modified   Brief/Interim Summary: 84 year old male with a history of hypertension, diabetes, hyperlipidemia, polymyalgia rheumatica, presented to the hospital with complaints of diplopia that lasted approximately 45 minutes.  He had associated headache.  Symptoms resolved prior to arrival to the hospital and have not recurred.  He was admitted for further workup of TIA.  MRI/MRA head were unremarkable.  Carotid Doppler showed less than 50% stenosis bilaterally.  Echocardiogram was also unrevealing.  Lipid panel showed HDL of 30s.  A1c was in acceptable range.  He was seen by neurology who felt that the patient had likely had another TIA.  Recommendations were for aspirin 162 mg daily.  He will be followed up with neurology as an outpatient.  He was seen by physical therapy who recommended home health physical therapy, patient has refused.  Patient feels ready for discharge home.  Discharge Diagnoses:  Principal Problem:   TIA (transient ischemic attack) Active Problems:   Essential hypertension   Type 2 diabetes mellitus ([name]   Anxiety   Clinical depression   Hypercholesterolemia   Polymyalgia rheumatica ([name]   Disease of thyroid gland   Migraine headaches    Discharge Instructions  Discharge Instructions    Diet - low sodium heart healthy   Complete by:  As directed    Increase activity slowly   Complete by:  As directed       Allergies as of 12/29/2017      Reactions   Iodine Hives, Itching, Other (See Comments)   very allergic(per daughter), can pre-med with benadryl   Calan [verapamil] Other (See Comments)   Weakness   Cymbalta [duloxetine Hcl] Other (See Comments)   Makes patient have jerking motions.   Iodinated Diagnostic Agents Hives, Itching, Other (See Comments)   Can pre-med with benadryl   Latex Other (See Comments)   Redness iritation   Lisinopril Other (See Comments)   Makes patient have jerking motions.   Neurontin [gabapentin] Other (See Comments)   Makes patient have jerking motions.   Zoloft [sertraline Hcl] Diarrhea, Other (See Comments)   Makes patient have jerking motions.   Valium [diazepam] Other (See Comments)   "Did not work"   Herbalist Hcl] Other (See Comments)   Dizziness   Glimepiride Other (See Comments)   "Did not work"   Melatonin Nausea Only   Tape Other (See Comments)   Skin irritation      Medication List    STOP taking these medications   omeprazole 20 MG capsule Commonly known as:  PRILOSEC     TAKE these medications   aspirin 81 MG EC tablet Take 2 tablets (162 mg total) by mouth daily. Start taking on:  2/53/6644   bismuth subsalicylate 034 VQ/25ZD suspension Commonly known as:  PEPTO BISMOL Take 30 mLs by mouth every 6 (six) hours as needed for diarrhea or loose stools.   diclofenac sodium 1 % Gel Commonly known as:  VOLTAREN Apply 2 g topically daily as needed (for pain).  escitalopram 10 MG tablet Commonly known as:  LEXAPRO Take 10 mg by mouth at bedtime.   fluocinonide cream 0.05 % Commonly known as:  LIDEX Apply 1 application topically daily as needed (for irritation).   fluticasone 50 MCG/ACT nasal spray Commonly known as:  FLONASE Place 2 sprays into both nostrils daily as needed for allergies.   hydrocortisone 2.5 % cream Apply 1 application topically 2 (two) times daily as needed (for psoriasis).   NP THYROID 15 MG  tablet Generic drug:  thyroid Take 15 mg by mouth daily before breakfast.   OTEZLA 10 & 20 & 30 MG Tbpk Generic drug:  Apremilast Take 10-30 mg by mouth See admin instructions. Take 10 mg by mouth daily on day 1, take 10 mg by mouth twice daily on day 2, take 10 mg by mouth in the morning and take 20 mg by mouth in the evening on day 3, take 20 mg by mouth twice daily on day 4, take 20 mg by mouth in the morning and take 30 mg by mouth in the evening on day 5, take 30 mg by mouth twice daily on day 6, then continue 30 mg by mouth twice daily   pantoprazole 20 MG tablet Commonly known as:  PROTONIX Take 20 mg by mouth daily.   predniSONE 5 MG tablet Commonly known as:  DELTASONE Take 5 mg by mouth every Tuesday.   pregabalin 50 MG capsule Commonly known as:  LYRICA Take 50 mg by mouth at bedtime.   topiramate 25 MG tablet Commonly known as:  TOPAMAX Take 25 mg by mouth 2 (two) times daily.   venlafaxine XR 37.5 MG 24 hr capsule Commonly known as:  EFFEXOR-XR Take 37.5 mg by mouth every morning.   Vitamin D3 5000 units Caps Take 5,000 Units by mouth at bedtime.   zolpidem 5 MG tablet Commonly known as:  AMBIEN Take 5-10 mg by mouth at bedtime.      Follow-up Information    Phillips Odor, MD. Schedule an appointment as soon as possible for a visit in 4 week(s).   Specialty:  Neurology Contact information: [address] [place] [address] [phone]          Allergies  Allergen Reactions  . Iodine Hives, Itching and Other (See Comments)    very allergic(per daughter), can pre-med with benadryl  . Calan [Verapamil] Other (See Comments)    Weakness  . Cymbalta [Duloxetine Hcl] Other (See Comments)    Makes patient have jerking motions.  . Iodinated Diagnostic Agents Hives, Itching and Other (See Comments)    Can pre-med with benadryl  . Latex Other (See Comments)    Redness iritation  . Lisinopril Other (See Comments)    Makes patient have jerking  motions.  . Neurontin [Gabapentin] Other (See Comments)    Makes patient have jerking motions.  [name] Kitchen Zoloft [Sertraline Hcl] Diarrhea and Other (See Comments)    Makes patient have jerking motions.  . Valium [Diazepam] Other (See Comments)    "Did not work"  . Flomax [Tamsulosin Hcl] Other (See Comments)    Dizziness  . Glimepiride Other (See Comments)    "Did not work"  . Melatonin Nausea Only  . Tape Other (See Comments)    Skin irritation    Consultations:  Neurology   Procedures/Studies: Dg Chest 2 View  Result Date: 12/28/2017 CLINICAL DATA:  Chest pain EXAM: CHEST  2 VIEW COMPARISON:  04/07/2016 FINDINGS: Mild cardiomegaly. Lungs are clear. No effusions  or acute bony abnormality. Degenerative changes in the thoracic spine. IMPRESSION: Cardiomegaly.  No active disease. Electronically Signed   By: Rolm Baptise M.D.   On: 12/28/2017 22:19   Ct Head Wo Contrast  Result Date: 12/28/2017 CLINICAL DATA:  Double vision starting 45 minutes ago. Headache this morning. EXAM: CT HEAD WITHOUT CONTRAST TECHNIQUE: Contiguous axial images were obtained from the base of the skull through the vertex without intravenous contrast. COMPARISON:  MRI brain 06/26/2016.  CT head 12/17/2015 FINDINGS: Brain: Mild diffuse cerebral atrophy. Ventricular dilatation consistent with central atrophy. Low-attenuation changes in the deep white matter consistent with small vessel ischemia. There is a tiny hyperdense focus demonstrated in right anterior thalamus which appears to be unchanged since previous study, likely representing dystrophic calcification. No definite evidence of acute intracranial hemorrhage. No mass effect or midline shift. No abnormal extra-axial fluid collections. Gray-white matter junctions are distinct. Basal cisterns are not effaced. Vascular: Intracranial arterial vascular calcifications are present. Skull: Calvarium appears intact. Sinuses/Orbits: Paranasal sinuses and mastoid air cells are  Other: None. IMPRESSION: No acute intracranial abnormalities. Mild diffuse cerebral atrophy and small vessel ischemic changes. Tiny hyperdense focus demonstrated in the right anterior thalamus appears to been present previously, likely representing a dystrophic calcification. Electronically Signed   By: Lucienne Capers M.D.   On: 12/28/2017 22:28   Mr Brain Wo Contrast  Result Date: 12/29/2017 CLINICAL DATA:  84 year old male with weakness and double vision for 1 day. No known injury. EXAM: MRI HEAD WITHOUT CONTRAST TECHNIQUE: Multiplanar, multiecho pulse sequences of the brain and surrounding structures were obtained without intravenous contrast. COMPARISON:  Head CT without contrast 12/28/2017. Brain MRI 06/26/2016, and earlier. FINDINGS: Brain: No restricted diffusion or evidence of acute infarction. Stable brain volume. No midline shift, mass effect, evidence of mass lesion, ventriculomegaly, extra-axial collection or acute intracranial hemorrhage. Cervicomedullary junction and pituitary are within normal limits. Stable scattered and patchy T2 and FLAIR hyperintensity in the bilateral cerebral white matter and pons. No cortical encephalomalacia or chronic cerebral blood products identified. Subtle scattered chronic lacunar infarcts in both cerebellar hemispheres are stable (e.g. Left SCA territory series 8, image 6). No new signal abnormality identified. Vascular: Major intracranial vascular flow voids are stable since 2017. Skull and upper cervical spine: Negative visualized cervical spine. Visualized bone marrow signal is within normal limits. Sinuses/Orbits: Postoperative changes to the left globe since 2017. Otherwise stable and negative appearance of the orbit soft tissues. Mild chronic right lamina papyracea fracture. Paranasal sinuses are stable and well pneumatized. Other: Stable mild left mastoid effusion. Negative nasopharynx. Right mastoids remain clear. Grossly normal visible internal auditory  structures. Scalp and face soft tissues appear negative. IMPRESSION: 1.  No acute intracranial abnormality. 2. Stable noncontrast MRI appearance of the brain since 2017. Moderate chronic signal changes in the cerebral white matter and pons compatible with chronic small vessel disease. Mild for age chronic small vessel disease in the cerebellum. Electronically Signed   By: Genevie Ann M.D.   On: 12/29/2017 07:58   US Carotid Bilateral (at [facility] And [facility] Only)  Result Date: 12/29/2017 CLINICAL DATA:  Diplopia, vertigo, TIA. EXAM: BILATERAL CAROTID DUPLEX ULTRASOUND TECHNIQUE: Pearline Cables scale imaging, color Doppler and duplex ultrasound were performed of bilateral carotid and vertebral arteries in the neck. COMPARISON:  None. FINDINGS: Criteria: Quantification of carotid stenosis is based on velocity parameters that correlate the residual internal carotid diameter with NASCET-based stenosis levels, using the diameter of the distal internal carotid lumen as the denominator for stenosis  measurement. The following velocity measurements were obtained: RIGHT ICA:  109 cm/sec CCA:  41 cm/sec SYSTOLIC ICA/CCA RATIO:  2.7 DIASTOLIC ICA/CCA RATIO:  2.2 ECA:  90 cm/sec LEFT ICA:  105 cm/sec CCA:  81 cm/sec SYSTOLIC ICA/CCA RATIO:  1.3 DIASTOLIC ICA/CCA RATIO:  2.2 ECA:  90 cm/sec RIGHT CAROTID ARTERY: Mild calcified plaque in the bulb. Low resistance internal carotid Doppler pattern. RIGHT VERTEBRAL ARTERY:  Antegrade. LEFT CAROTID ARTERY: Mild irregular calcified plaque in the bulb. Low resistance internal carotid Doppler pattern. LEFT VERTEBRAL ARTERY:  Antegrade. IMPRESSION: Less than 50% stenosis in the right and left internal carotid arteries. Electronically Signed   By: Marybelle Killings M.D.   On: 12/29/2017 08:33   Mr [name] Head/brain YH Cm  Result Date: 12/29/2017 CLINICAL DATA:  84 year old male with weakness and double vision for 1 day. No known injury. EXAM: MRA HEAD WITHOUT CONTRAST TECHNIQUE: Angiographic images of the Circle  of Willis were obtained using MRA technique without intravenous contrast. COMPARISON:  Brain MRI today reported separately. Intracranial MRA 12/19/2015. FINDINGS: Substantially improved MRA quality today compared to 2017. Antegrade flow in the posterior circulation with fairly codominant distal vertebral arteries. Normal PICA origins and vertebrobasilar junction. Patent basilar artery without stenosis. Normal SCA and PCA origins. Posterior communicating arteries are diminutive or absent. Mild irregularity of the bilateral PCA branches with symmetric distal flow. Antegrade flow in both ICA siphons. The right siphon is mildly dominant. Small inferiorly directed infundibulum at the distal right ICA. No siphon stenosis. Normal ophthalmic artery origins. Patent carotid termini. Dominant right and diminutive left ACA A1 segments. Normal anterior communicating artery and visible ACA branches. Normal bilateral MCA origins. Bilateral MCA M1 segments, bilateral MCA trifurcations, and visible bilateral MCA branches are within normal limits. IMPRESSION: Normal for age intracranial MRA. Electronically Signed   By: Genevie Ann M.D.   On: 12/29/2017 08:01    Echo: - Mild LVH with LVEF 65-70% and grade 1 diastolic dysfunction.   Mildly calcified mitral annulus with trivial mitral   regurgitation. Mild calcific aortic stenosis with mild aortic   regurgitation. Trivial tricuspid regurgitation.   Subjective: Feeling better.  Diplopia has resolved.  No headache.  Discharge Exam: Vitals:   12/29/17 0635 12/29/17 1430  BP: 138/65 (!) 161/65  Pulse: (!) 59 64  Resp: 18 18  Temp: 97.8 F (36.6 C) 98.5 F (36.9 C)  SpO2: 98% 97%   Vitals:   12/29/17 0259 12/29/17 0500 12/29/17 0635 12/29/17 1430  BP: (!) 116/53 132/62 138/65 (!) 161/65  Pulse: 60 61 (!) 59 64  Resp: 20  18 18   Temp: 97.7 F (36.5 C) 97.9 F (36.6 C) 97.8 F (36.6 C) 98.5 F (36.9 C)  TempSrc: Oral Oral Oral Oral  SpO2: 97% 96% 98% 97%  Weight:       Height:        General: Pt is alert, awake, not in acute distress Cardiovascular: RRR, S1/S2 +, no rubs, no gallops Respiratory: CTA bilaterally, no wheezing, no rhonchi Abdominal: Soft, NT, ND, bowel sounds + Extremities: no edema, no cyanosis    The results of significant diagnostics from this hospitalization (including imaging, microbiology, ancillary and laboratory) are listed below for reference.     Microbiology: No results found for this or any previous visit (from the past 240 hour(s)).   Labs: BNP (last 3 results) No results for input(s): BNP in the last 8760 hours. Basic Metabolic Panel: Recent Labs  Lab 12/28/17 2143 12/28/17 2202  NA 138  141  K 4.1 4.1  CL 106 105  CO2 24  --   GLUCOSE 194* 187*  BUN 18 19  CREATININE 1.19 1.10  CALCIUM 9.3  --    Liver Function Tests: Recent Labs  Lab 12/28/17 2143  AST 20  ALT 19  ALKPHOS 78  BILITOT 0.4  PROT 7.2  ALBUMIN 3.8   No results for input(s): LIPASE, AMYLASE in the last 168 hours. No results for input(s): AMMONIA in the last 168 hours. CBC: Recent Labs  Lab 12/28/17 2143 12/28/17 2202  WBC 8.5  --   NEUTROABS 5.4  --   HGB 13.6 14.3  HCT 42.4 42.0  MCV 99.1  --   PLT 210  --    Cardiac Enzymes: Recent Labs  Lab 12/28/17 2143  TROPONINI <0.03   BNP: Invalid input(s): POCBNP CBG: Recent Labs  Lab 12/29/17 0814 12/29/17 1123  GLUCAP 119* 211*   D-Dimer No results for input(s): DDIMER in the last 72 hours. Hgb A1c Recent Labs    12/29/17 0455  HGBA1C 6.8*   Lipid Profile Recent Labs    12/29/17 0455  CHOL 160  HDL 34*  LDLCALC 93  TRIG 166*  CHOLHDL 4.7   Thyroid function studies No results for input(s): TSH, T4TOTAL, T3FREE, THYROIDAB in the last 72 hours.  Invalid input(s): FREET3 Anemia work up No results for input(s): VITAMINB12, FOLATE, FERRITIN, TIBC, IRON, RETICCTPCT in the last 72 hours. Urinalysis    Component Value Date/Time   COLORURINE YELLOW  12/29/2017 0200   APPEARANCEUR CLEAR 12/29/2017 0200   LABSPEC 1.025 12/29/2017 0200   PHURINE 5.0 12/29/2017 0200   GLUCOSEU NEGATIVE 12/29/2017 0200   HGBUR SMALL (A) 12/29/2017 0200   BILIRUBINUR NEGATIVE 12/29/2017 0200   KETONESUR NEGATIVE 12/29/2017 0200   PROTEINUR 30 (A) 12/29/2017 0200   UROBILINOGEN 1.0 05/21/2015 1050   NITRITE NEGATIVE 12/29/2017 0200   LEUKOCYTESUR NEGATIVE 12/29/2017 0200   Sepsis Labs Invalid input(s): PROCALCITONIN,  WBC,  LACTICIDVEN Microbiology No results found for this or any previous visit (from the past 240 hour(s)).   Time coordinating discharge: Over 30 minutes  SIGNED:   Kathie Dike, MD  [facility] 12/29/2017, 2:34 PM Pager   If 7PM-7AM, please contact night-coverage [email or web address] Password [email or web address]

## 2017-12-29 NOTE — Evaluation (Signed)
Physical Therapy Evaluation Patient Details Name: Jared Bridges MRN: [id number] DOB: 1930-01-15 Today's Date: 12/29/2017   History of Present Illness  Jared Bridges is a 84 y.o. male with medical history significant of iron deficiency anemia, chronic migraine headaches, chronic kidney disease, depression, diverticulosis with history of diverticulitis, essential hypertension, PUD, history of GI bleed in 1998, hypercholesterolemia, orthostatic hypotension, peripheral neuropathy, type 2 diabetes, polymyalgia rheumatica, history of syncope, history of stroke with residual trouble reading and writing, thyroid nodule who is coming to the emergency department with complaints of having double vision 45 minutes before arrival while he was reading a book.  He mentions that earlier in the morning he had left-sided periorbital headache and had to cancel an ophthalmology appointment at [facility].  The headache subsequently subsided around noontime.    Clinical Impression  Patient functioning near baseline for functional mobility and gait demonstrating slightly slower than normal speed of cadence when walking in hallways/stairs and occasional step to pattern using cane without loss of balance.  Patient will benefit from continued physical therapy in hospital and recommended venue below to increase strength, balance, endurance for safe ADLs and gait.    Follow Up Recommendations Home health PT;Supervision - Intermittent    Equipment Recommendations  None recommended by PT    Recommendations for Other Services       Precautions / Restrictions Precautions Precautions: Fall Restrictions Weight Bearing Restrictions: No      Mobility  Bed Mobility Overal bed mobility: Modified Independent                Transfers Overall transfer level: Modified independent Equipment used: Straight cane                Ambulation/Gait Ambulation/Gait assistance: Supervision Ambulation Distance (Feet): 100  Feet Assistive device: Straight cane Gait Pattern/deviations: Step-through pattern;Decreased step length - right;Decreased step length - left;Decreased stride length   Gait velocity interpretation: Below normal speed for age/gender General Gait Details: grossly WFL except decreased stride length and occasional 3 point gait pattern with SPC once fatigued.  Stairs Stairs: Yes Stairs assistance: Supervision Stair Management: One rail Right;Step to pattern;With cane;One rail Left Number of Stairs: 18 General stair comments: demonstrates good return for going up/down stairs with step to pattern using 1 siderail and SPC without loss of balance  Wheelchair Mobility    Modified Rankin (Stroke Patients Only)       Balance Overall balance assessment: Needs assistance Sitting-balance support: No upper extremity supported;Feet supported Sitting balance-Leahy Scale: Good     Standing balance support: Single extremity supported;During functional activity Standing balance-Leahy Scale: Good Standing balance comment: fair/good with SPC                             Pertinent Vitals/Pain Pain Assessment: 0-10 Pain Score: 2  Pain Location: headache left side of forehead Pain Descriptors / Indicators: Aching Pain Intervention(s): Limited activity within patient's tolerance;Monitored during session    Home Living Family/patient expects to be discharged to:: Private residence Living Arrangements: Spouse/significant other;Children Available Help at Discharge: Family Type of Home: House Home Access: Stairs to enter Entrance Stairs-Rails: Right;Left;Can reach both Entrance Stairs-Number of Steps: 2 with no hand rails in front, 2 with bilateral hand rails in the back Home Layout: Two level Home Equipment: Cane - single point;Walker - 2 wheels;Bedside commode;Shower seat;Wheelchair - manual      Prior Function Level of Independence: Independent with assistive device(s)  Comments: ambulates community distances with a SPC, drives occasionally     Hand Dominance   Dominant Hand: Right    Extremity/Trunk Assessment   Upper Extremity Assessment Upper Extremity Assessment: Defer to OT evaluation    Lower Extremity Assessment Lower Extremity Assessment: Overall WFL for tasks assessed;RLE deficits/detail;LLE deficits/detail RLE Deficits / Details: grossly WFL except ankle dorsiflexion -4/5, baseline per patient possibly due to neuropathy LLE Deficits / Details: grossly WFL except knee extension/hip flexion 3+/5, base line per patient since knee surgeries    Cervical / Trunk Assessment Cervical / Trunk Assessment: Normal  Communication   Communication: No difficulties(wearing hearing aides)  Cognition Arousal/Alertness: Awake/alert Behavior During Therapy: WFL for tasks assessed/performed Overall Cognitive Status: Within Functional Limits for tasks assessed                                        General Comments      Exercises     Assessment/Plan    PT Assessment Patient needs continued PT services  PT Problem List Decreased strength;Decreased activity tolerance;Decreased balance;Decreased mobility       PT Treatment Interventions Gait training;Stair training;Functional mobility training;Therapeutic activities;Therapeutic exercise;Patient/family education    PT Goals (Current goals can be found in the Care Plan section)  Acute Rehab PT Goals Patient Stated Goal: return home PT Goal Formulation: With patient/family Time For Goal Achievement: 12/31/17 Potential to Achieve Goals: Good    Frequency 7X/week   Barriers to discharge        Co-evaluation               AM-PAC PT "6 Clicks" Daily Activity  Outcome Measure Difficulty turning over in bed (including adjusting bedclothes, sheets and blankets)?: None Difficulty moving from lying on back to sitting on the side of the bed? : None Difficulty sitting down  on and standing up from a chair with arms (e.g., wheelchair, bedside commode, etc,.)?: None Help needed moving to and from a bed to chair (including a wheelchair)?: None Help needed walking in hospital room?: A Little Help needed climbing 3-5 steps with a railing? : A Little 6 Click Score: 22    End of Session   Activity Tolerance: Patient tolerated treatment well;Patient limited by fatigue Patient left: in bed;with call bell/phone within reach;with family/visitor present(seated at bedside) Nurse Communication: Mobility status PT Visit Diagnosis: Unsteadiness on feet (R26.81);Other abnormalities of gait and mobility (R26.89);Muscle weakness (generalized) (M62.81)    Time: 0623-7628 PT Time Calculation (min) (ACUTE ONLY): 27 min   Charges:   PT Evaluation $PT Eval Low Complexity: 1 Low PT Treatments $Therapeutic Activity: 23-37 mins   PT G Codes:        12:02 PM, 03-Jan-2018 Lonell Grandchild, MPT Physical Therapist with [facility] 336 [phone] office [phone] mobile phone

## 2017-12-29 NOTE — Plan of Care (Signed)
  Acute Rehab PT Goals(only PT should resolve) Patient Will Transfer Sit To/From Stand 12/29/2017 1204 - Progressing by Lonell Grandchild, PT Flowsheets Taken 12/29/2017 1204  Patient will transfer sit to/from stand with modified independence Pt Will Transfer Bed To Chair/Chair To Bed 12/29/2017 1204 - Progressing by Lonell Grandchild, PT Flowsheets Taken 12/29/2017 1204  Pt will Transfer Bed to Chair/Chair to Bed with modified independence Pt Will Ambulate 12/29/2017 1204 - Progressing by Lonell Grandchild, PT Flowsheets Taken 12/29/2017 1204  Pt will Ambulate with modified independence;> 125 feet;with cane Pt Will Go Up/Down Stairs 12/29/2017 1204 - Progressing by Lonell Grandchild, PT Flowsheets Taken 12/29/2017 1204  Pt will Go Up / Down Stairs 6-9 stairs;with modified independence;with rail(s);with cane  12:05 PM, 12/29/17 Lonell Grandchild, MPT Physical Therapist with [facility] [phone] office [phone] mobile phone

## 2017-12-29 NOTE — Plan of Care (Signed)
Pt is progressing 

## 2017-12-29 NOTE — Care Management Obs Status (Signed)
MEDICARE OBSERVATION STATUS NOTIFICATION   Patient Details  Name: Jared Bridges MRN: [id number] Date of Birth: 1930-05-19   Medicare Observation Status Notification Given:  Yes    Sherald Barge, RN 12/29/2017, 2:14 PM

## 2017-12-29 NOTE — Progress Notes (Signed)
OT Cancellation Note  Patient Details Name: MICHEAL MURAD MRN: [id number] DOB: [birth year]   Cancelled Treatment:    Reason Eval/Treat Not Completed: Patient at procedure or test/ unavailable. OT attempted evaluation x2. Pt off floor for MRI/Ultrasound. Will attempt evaluation at a later time.   Guadelupe Sabin, OTR/L  [phone] 12/29/2017, 8:02 AM

## 2017-12-29 NOTE — Progress Notes (Signed)
Patient and family given discharge instructions at bedside.

## 2017-12-29 NOTE — Progress Notes (Signed)
*  PRELIMINARY RESULTS* Echocardiogram 2D Echocardiogram has been performed.  Jared Bridges 12/29/2017, 9:31 AM

## 2017-12-29 NOTE — Care Management Note (Signed)
Case Management Note  Patient Details  Name: Jared Bridges MRN: [id number] Date of Birth: 1929-12-31  Subjective/Objective:           Admitted for TIA work-up. Pt from home with wife. Strong support from daughter. Pt ind with ADL;s. Drives and is not home bound. PT has recommended HH PT. Pt declines HH at this time. He says he has had HH multiple times in the past and it has never seemed to help his chronic conditions that cause him problems (like his neuropathy). He knows how to excersize at home alone.          Action/Plan: DC home with self care. No CM needs noted at this time.   Expected Discharge Date:  12/29/17               Expected Discharge Plan:  Home/Self Care  In-House Referral:  NA  Discharge planning Services  CM Consult  Post Acute Care Choice:  NA Choice offered to:  NA  Status of Service:  Completed, signed off  If discussed at Long Length of Stay Meetings, dates discussed:    Additional Comments:  Sherald Barge, RN 12/29/2017, 2:15 PM

## 2017-12-29 NOTE — Consult Note (Signed)
[place] A. Merlene Laughter, MD     [email or web address]          Jared Bridges is an 84 y.o. male.   ASSESSMENT/PLAN: 1. 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  Aspirin  162 mg is recommended.  Also continue with of blood pressure and diabetes control.        Patient is an 84 year old right-handed white male who is seen in a clinic routinely for various problems.  He was seen a week ago apparently does not feeling well with gait impairment and fatigue along with urinary symptoms.  He thought he had a UTI and was sent over for those labs and disease primary care provider.  Heat tells me that earlier today he had the acute onset of diplopia.   The diplopia was mostly horizontal although somewhat vertical also.  The entire episode lasted for about 45 min.   It is unclear if the diplopia was binocular.  He does indicate that he was tending to lean to the left.  He does not report dysarthria, dysphagia, headaches or clear focal numbness or weakness.  The wife tells me that he had an event like this several years ago and was told that he may have had a stroke at that time.  The workup is not available.  He has returned to baseline and tells me that he feels well without any residual problems at this time.  He does not take aspirin on a routine basis.  He does have some chronic headache issues which occur episodically and has been somewhat more today during and after his event.  The review of systems otherwise negative.      GENERAL:    He is doing well at this time and in no acute distress.  HEENT:   This is normal.  ABDOMEN: soft  EXTREMITIES: No edema   BACK:  Normal  SKIN: Normal by inspection.    MENTAL STATUS: Alert and oriented -  Including age and the month. Speech, language and cognition are generally intact. Judgment and insight normal.   CRANIAL NERVES: Pupils are  equal, round and reactive to light and accomodation; extra ocular movements are full, there is no significant nystagmus; visual fields are full; upper and lower facial muscles are normal in strength and symmetric, there is no flattening of the nasolabial folds; tongue is midline; uvula is midline; shoulder elevation is normal.  MOTOR: Normal tone, bulk and strength; no pronator drift. There is no drift of the upper extremities or lower extremities.  COORDINATION: Left finger to nose is normal, right finger to nose is normal, No rest tremor; no intention tremor; no postural tremor; no bradykinesia.  REFLEXES: Deep tendon reflexes are symmetrical and normal. Plantar reflexes are flexor bilaterally.   SENSATION: Normal to light touch, temperature, and pain.  GAIT: Normal.     NIH stroke scale 0.  Blood pressure 138/65, pulse (!) 59, temperature 97.8 F (36.6 C), temperature source Oral, resp. rate 18, height '6\' 1"'  (1.854 m), weight 215 lb 9.8 oz (97.8 kg), SpO2 98 %.  Past Medical History:  Diagnosis Date  . Anemia, iron deficiency   . Chronic headaches    Migraines  . Chronic kidney disease    Resolved  . Depression   . Diverticulitis   . Essential hypertension   . GI bleed    Dr. Laural Golden - 1998  . Hypercholesteremia   . Orthostatic hypotension   .  Peptic ulcer disease   . Peripheral neuropathy   . Polymyalgia rheumatica ([place])   . Stroke [facility])    Residual trouble reading and writing  . Syncope   . Thyroid nodule   . Type 2 diabetes mellitus ([place])     Past Surgical History:  Procedure Laterality Date  . APPENDECTOMY     as child  . BACK SURGERY     2000 IDET SPINAL PROC  . CHOLECYSTECTOMY N/A 04/07/2016   Procedure: CHOLECYSTECTOMY;  Surgeon: Aviva Signs, MD;  Location: [facility];  Service: General;  Laterality: N/A;  . [place]  . CRYOTHERAPY    . EYE SURGERY     right cataract with lens implant  . FEMUR SURGERY     RT LEG        1953   . HAND SURGERY     RIGHT  . HERNIA REPAIR     1-right inguinal, 3- left inguinal  . IRRIGATION AND DEBRIDEMENT KNEE Left 07/09/2015   Procedure: LEFT KNEE IRRIGATION AND DEBRIDEMENT WOUND CLOSURE;  Surgeon: Gaynelle Arabian, MD;  Location: [facility];  Service: Orthopedics;  Laterality: Left;  . JOINT REPLACEMENT  02/28/2016   lt knee revision  . KNEE DEBRIDEMENT     2016   LEFT  . MENISCUS REPAIR     LEFT  . SHOULDER OPEN ROTATOR CUFF REPAIR     RT SHOULDER  . SPINAL FUSION    . SPINAL FUSION    . TONSILLECTOMY    . TOTAL KNEE ARTHROPLASTY Left 05/28/2015   Procedure: LEFT TOTAL KNEE ARTHROPLASTY;  Surgeon: Gaynelle Arabian, MD;  Location: [facility];  Service: Orthopedics;  Laterality: Left;  . TOTAL KNEE REVISION Left 02/28/2016   Procedure: LEFT TOTAL KNEE REVISION;  Surgeon: Leandrew Koyanagi, MD;  Location: [place];  Service: Orthopedics;  Laterality: Left;  . TRIGGER FINGER RELEASE     RT HAND   1990S  . UVULECTOMY  2004   TURBINATE,TONSIL,ADENOID    Family History  Problem Relation Age of Onset  . Colon cancer Sister   . Hypertension Unknown   . Depression Brother   . Depression Daughter     Social History:  reports that he quit smoking about 31 years ago. His smoking use included cigarettes. He started smoking about 59 years ago. He has a 1.80 pack-year smoking history. he has never used smokeless tobacco. He reports that he does not drink alcohol or use drugs.  Allergies:  Allergies  Allergen Reactions  . Iodine Hives, Itching and Other (See Comments)    very allergic(per daughter), can pre-med with benadryl  . Calan [Verapamil] Other (See Comments)    Weakness  . Cymbalta [Duloxetine Hcl] Other (See Comments)    Makes patient have jerking motions.  . Iodinated Diagnostic Agents Hives, Itching and Other (See Comments)    Can pre-med with benadryl  . Latex Other (See Comments)    Redness iritation  . Lisinopril Other (See Comments)    Makes patient have jerking motions.  .  Neurontin [Gabapentin] Other (See Comments)    Makes patient have jerking motions.  [name] Kitchen Zoloft [Sertraline Hcl] Diarrhea and Other (See Comments)    Makes patient have jerking motions.  . Valium [Diazepam] Other (See Comments)    "Did not work"  . Flomax [Tamsulosin Hcl] Other (See Comments)    Dizziness  . Glimepiride Other (See Comments)    "Did not work"  . Melatonin  Nausea Only  . Tape Other (See Comments)    Skin irritation    Medications: Prior to Admission medications   Medication Sig Start Date End Date Taking? Authorizing Provider  Apremilast (OTEZLA) 10 & 20 & 30 MG TBPK Take 10-30 mg by mouth See admin instructions. Take 10 mg by mouth daily on day 1, take 10 mg by mouth twice daily on day 2, take 10 mg by mouth in the morning and take 20 mg by mouth in the evening on day 3, take 20 mg by mouth twice daily on day 4, take 20 mg by mouth in the morning and take 30 mg by mouth in the evening on day 5, take 30 mg by mouth twice daily on day 6, then continue 30 mg by mouth twice daily   Yes [provider]  bismuth subsalicylate (PEPTO BISMOL) 262 MG/15ML suspension Take 30 mLs by mouth every 6 (six) hours as needed for diarrhea or loose stools.   Yes [provider]  Cholecalciferol (VITAMIN D3) 5000 units CAPS Take 5,000 Units by mouth at bedtime.   Yes [provider]  diclofenac sodium (VOLTAREN) 1 % GEL Apply 2 g topically daily as needed (for pain). 11/22/15  Yes Dohmeier, Asencion Partridge, MD  escitalopram (LEXAPRO) 10 MG tablet Take 10 mg by mouth at bedtime.  01/31/16  Yes [provider]  fluocinonide cream (LIDEX) 6.81 % Apply 1 application topically daily as needed (for irritation).    Yes [provider]  hydrocortisone 2.5 % cream Apply 1 application topically 2 (two) times daily as needed (for psoriasis).   Yes [provider]  omeprazole (PRILOSEC) 20 MG capsule Take 20 mg by mouth daily.   Yes [provider]    pantoprazole (PROTONIX) 20 MG tablet Take 20 mg by mouth daily.    Yes [provider]  predniSONE (DELTASONE) 5 MG tablet Take 5 mg by mouth every Tuesday.  10/03/14  Yes [provider]  pregabalin (LYRICA) 50 MG capsule Take 50 mg by mouth at bedtime.    Yes [provider]  topiramate (TOPAMAX) 25 MG tablet Take 25 mg by mouth 2 (two) times daily.   Yes [provider]  venlafaxine XR (EFFEXOR-XR) 37.5 MG 24 hr capsule Take 37.5 mg by mouth every morning.  08/15/16  Yes [provider]  zolpidem (AMBIEN) 5 MG tablet Take 5-10 mg by mouth at bedtime.  08/18/16  Yes [provider]  fluticasone (FLONASE) 50 MCG/ACT nasal spray Place 2 sprays into both nostrils daily as needed for allergies.  01/07/16   [provider]  thyroid (NP THYROID) 15 MG tablet Take 15 mg by mouth daily before breakfast.    [provider]    Scheduled Meds: . Apremilast  10-30 mg Oral See admin instructions  . aspirin EC  81 mg Oral Daily  . cholecalciferol  5,000 Units Oral QHS  . enoxaparin (LOVENOX) injection  40 mg Subcutaneous Q24H  . escitalopram  10 mg Oral QHS  . pantoprazole  20 mg Oral Daily  . predniSONE  5 mg Oral Q Tue  . pregabalin  50 mg Oral QHS  . thyroid  15 mg Oral QAC breakfast  . topiramate  25 mg Oral BID  . venlafaxine XR  37.5 mg Oral q morning - 10a  . zolpidem  5 mg Oral QHS   Continuous Infusions: PRN Meds:.acetaminophen **OR** acetaminophen (TYLENOL) oral liquid 160 mg/5 mL **OR** acetaminophen, bismuth subsalicylate, fluticasone, hydrocortisone  Results for orders placed or performed during the hospital encounter of 12/28/17 (from the past 48 hour(s))  Protime-INR     Status: None   Collection Time: 12/28/17  9:43 PM  Result Value Ref Range   Prothrombin Time 12.9 11.4 - 15.2 seconds   INR 0.98   APTT     Status: None   Collection Time: 12/28/17  9:43 PM  Result Value Ref Range   aPTT 31 24 - 36  seconds  CBC     Status: None   Collection Time: 12/28/17  9:43 PM  Result Value Ref Range   WBC 8.5 4.0 - 10.5 K/uL   RBC 4.28 4.22 - 5.81 MIL/uL   Hemoglobin 13.6 13.0 - 17.0 g/dL   HCT 42.4 39.0 - 52.0 %   MCV 99.1 78.0 - 100.0 fL   MCH 31.8 26.0 - 34.0 pg   MCHC 32.1 30.0 - 36.0 g/dL   RDW 13.3 11.5 - 15.5 %   Platelets 210 150 - 400 K/uL  Differential     Status: None   Collection Time: 12/28/17  9:43 PM  Result Value Ref Range   Neutrophils Relative % 64 %   Neutro Abs 5.4 1.7 - 7.7 K/uL   Lymphocytes Relative 21 %   Lymphs Abs 1.7 0.7 - 4.0 K/uL   Monocytes Relative 10 %   Monocytes Absolute 0.9 0.1 - 1.0 K/uL   Eosinophils Relative 5 %   Eosinophils Absolute 0.4 0.0 - 0.7 K/uL   Basophils Relative 0 %   Basophils Absolute 0.0 0.0 - 0.1 K/uL  Comprehensive metabolic panel     Status: Abnormal   Collection Time: 12/28/17  9:43 PM  Result Value Ref Range   Sodium 138 135 - 145 mmol/L   Potassium 4.1 3.5 - 5.1 mmol/L   Chloride 106 101 - 111 mmol/L   CO2 24 22 - 32 mmol/L   Glucose, Bld 194 (H) 65 - 99 mg/dL   BUN 18 6 - 20 mg/dL   Creatinine, Ser 1.19 0.61 - 1.24 mg/dL   Calcium 9.3 8.9 - 10.3 mg/dL   Total Protein 7.2 6.5 - 8.1 g/dL   Albumin 3.8 3.5 - 5.0 g/dL   AST 20 15 - 41 U/L   ALT 19 17 - 63 U/L   Alkaline Phosphatase 78 38 - 126 U/L   Total Bilirubin 0.4 0.3 - 1.2 mg/dL   GFR calc non Af Amer 54 (L) >60 mL/min   GFR calc Af Amer >60 >60 mL/min    Comment: (NOTE) The eGFR has been calculated using the CKD EPI equation. This calculation has not been validated in all clinical situations. eGFR's persistently <60 mL/min signify possible Chronic Kidney Disease.    Anion gap 8 5 - 15  Troponin I     Status: None   Collection Time: 12/28/17  9:43 PM  Result Value Ref Range   Troponin I <0.03 <0.03 ng/mL  I-Stat Chem 8, ED     Status: Abnormal   Collection Time: 12/28/17 10:02 PM  Result Value Ref Range   Sodium 141 135 - 145 mmol/L   Potassium 4.1 3.5  - 5.1 mmol/L   Chloride 105 101 - 111 mmol/L   BUN 19 6 - 20 mg/dL   Creatinine, Ser 1.10 0.61 - 1.24 mg/dL   Glucose, Bld 187 (H) 65 - 99 mg/dL   Calcium, Ion 1.20 1.15 - 1.40 mmol/L   TCO2 26 22 - 32 mmol/L   Hemoglobin  14.3 13.0 - 17.0 g/dL   HCT 42.0 39.0 - 52.0 %  Ethanol     Status: None   Collection Time: 12/28/17 10:08 PM  Result Value Ref Range   Alcohol, Ethyl (B) <10 <10 mg/dL    Comment:        LOWEST DETECTABLE LIMIT FOR SERUM ALCOHOL IS 10 mg/dL FOR MEDICAL PURPOSES ONLY   Urine rapid drug screen (hosp performed)     Status: None   Collection Time: 12/29/17  2:00 AM  Result Value Ref Range   Opiates NONE DETECTED NONE DETECTED   Cocaine NONE DETECTED NONE DETECTED   Benzodiazepines NONE DETECTED NONE DETECTED   Amphetamines NONE DETECTED NONE DETECTED   Tetrahydrocannabinol NONE DETECTED NONE DETECTED   Barbiturates NONE DETECTED NONE DETECTED    Comment: (NOTE) DRUG SCREEN FOR MEDICAL PURPOSES ONLY.  IF CONFIRMATION IS NEEDED FOR ANY PURPOSE, NOTIFY LAB WITHIN 5 DAYS. LOWEST DETECTABLE LIMITS FOR URINE DRUG SCREEN Drug Class                     Cutoff (ng/mL) Amphetamine and metabolites    1000 Barbiturate and metabolites    200 Benzodiazepine                 332 Tricyclics and metabolites     300 Opiates and metabolites        300 Cocaine and metabolites        300 THC                            50   Urinalysis, Routine w reflex microscopic     Status: Abnormal   Collection Time: 12/29/17  2:00 AM  Result Value Ref Range   Color, Urine YELLOW YELLOW   APPearance CLEAR CLEAR   Specific Gravity, Urine 1.025 1.005 - 1.030   pH 5.0 5.0 - 8.0   Glucose, UA NEGATIVE NEGATIVE mg/dL   Hgb urine dipstick SMALL (A) NEGATIVE   Bilirubin Urine NEGATIVE NEGATIVE   Ketones, ur NEGATIVE NEGATIVE mg/dL   Protein, ur 30 (A) NEGATIVE mg/dL   Nitrite NEGATIVE NEGATIVE   Leukocytes, UA NEGATIVE NEGATIVE   RBC / HPF 0-5 0 - 5 RBC/hpf   WBC, UA 0-5 0 - 5 WBC/hpf    Bacteria, UA NONE SEEN NONE SEEN   Squamous Epithelial / LPF NONE SEEN NONE SEEN   Mucus PRESENT    Hyaline Casts, UA PRESENT   Lipid panel     Status: Abnormal   Collection Time: 12/29/17  4:55 AM  Result Value Ref Range   Cholesterol 160 0 - 200 mg/dL   Triglycerides 166 (H) <150 mg/dL   HDL 34 (L) >40 mg/dL   Total CHOL/HDL Ratio 4.7 RATIO   VLDL 33 0 - 40 mg/dL   LDL Cholesterol 93 0 - 99 mg/dL    Comment:        Total Cholesterol/HDL:CHD Risk Coronary Heart Disease Risk Table                     Men   Women  1/2 Average Risk   3.4   3.3  Average Risk       5.0   4.4  2 X Average Risk   9.6   7.1  3 X Average Risk  23.4   11.0        Use the calculated Patient Ratio above and the  CHD Risk Table to determine the patient's CHD Risk.        ATP III CLASSIFICATION (LDL):  <100     mg/dL   Optimal  100-129  mg/dL   Near or Above                    Optimal  130-159  mg/dL   Borderline  160-189  mg/dL   High  >190     mg/dL   Very High   Glucose, capillary     Status: Abnormal   Collection Time: 12/29/17  8:14 AM  Result Value Ref Range   Glucose-Capillary 119 (H) 65 - 99 mg/dL   Comment 1 Notify RN    Comment 2 Document in Chart     Studies/Results:  BRAIN MRI MRA FINDINGS: Brain: No restricted diffusion or evidence of acute infarction.  Stable brain volume. No midline shift, mass effect, evidence of mass lesion, ventriculomegaly, extra-axial collection or acute intracranial hemorrhage. Cervicomedullary junction and pituitary are within normal limits.  Stable scattered and patchy T2 and FLAIR hyperintensity in the bilateral cerebral white matter and pons. No cortical encephalomalacia or chronic cerebral blood products identified. Subtle scattered chronic lacunar infarcts in both cerebellar hemispheres are stable (e.g. Left SCA territory series 8, image 6). No new signal abnormality identified.  Vascular: Major intracranial vascular flow voids are stable  since 2017.  Skull and upper cervical spine: Negative visualized cervical spine. Visualized bone marrow signal is within normal limits.  Sinuses/Orbits: Postoperative changes to the left globe since 2017. Otherwise stable and negative appearance of the orbit soft tissues. Mild chronic right lamina papyracea fracture. Paranasal sinuses are stable and well pneumatized.  Other: Stable mild left mastoid effusion. Negative nasopharynx. Right mastoids remain clear. Grossly normal visible internal auditory structures. Scalp and face soft tissues appear negative.  IMPRESSION: 1.  No acute intracranial abnormality. 2. Stable noncontrast MRI appearance of the brain since 2017. Moderate chronic signal changes in the cerebral white matter and pons compatible with chronic small vessel disease. Mild for age chronic small vessel disease in the cerebellum.      THE BRAIN MRI AND MRA ARE REVIEWED IN PERSON. nO ACUTEINFARCTS ARE SEEN ON dwi. swi SODIUM NO HEMORRHAGE. nO INFARCTS ARE SEEN ON t1. tHERE IS MILD TO MODERATE CONFLUENT AND DEEP WHITE MATER LEUKOENCEPATHY.  No occlusive lesions seen on MRA. There is an absent A1 segment on the left.   Deyanna Mctier A. Merlene Laughter, M.D.  Diplomate, Tax adviser of Psychiatry and Neurology ( Neurology). 12/29/2017, 9:43 AM

## 2018-01-06 ENCOUNTER — Encounter (HOSPITAL_COMMUNITY): Payer: Self-pay | Admitting: *Deleted

## 2018-01-07 ENCOUNTER — Encounter (HOSPITAL_COMMUNITY): Payer: Self-pay | Admitting: *Deleted

## 2018-01-07 ENCOUNTER — Other Ambulatory Visit: Payer: Self-pay

## 2018-01-07 ENCOUNTER — Encounter (HOSPITAL_COMMUNITY)
Admission: RE | Disposition: A | Discharge status: Home / Self Care | Payer: Self-pay | Source: Ambulatory Visit | Attending: Internal Medicine

## 2018-01-07 ENCOUNTER — Ambulatory Visit (HOSPITAL_COMMUNITY)
Admission: RE | Admit: 2018-01-07 | Discharge: 2018-01-07 | Disposition: A | Discharge status: Home / Self Care | Payer: Medicare Other | Source: Ambulatory Visit | Attending: Internal Medicine | Admitting: Internal Medicine

## 2018-01-07 DIAGNOSIS — Z96652 Presence of left artificial knee joint: Secondary | ICD-10-CM | POA: Insufficient documentation

## 2018-01-07 DIAGNOSIS — L409 Psoriasis, unspecified: Secondary | ICD-10-CM | POA: Insufficient documentation

## 2018-01-07 DIAGNOSIS — E1140 Type 2 diabetes mellitus with diabetic neuropathy, unspecified: Secondary | ICD-10-CM | POA: Insufficient documentation

## 2018-01-07 DIAGNOSIS — K921 Melena: Secondary | ICD-10-CM | POA: Insufficient documentation

## 2018-01-07 DIAGNOSIS — I69398 Other sequelae of cerebral infarction: Secondary | ICD-10-CM | POA: Diagnosis not present

## 2018-01-07 DIAGNOSIS — E7800 Pure hypercholesterolemia, unspecified: Secondary | ICD-10-CM | POA: Insufficient documentation

## 2018-01-07 DIAGNOSIS — Z91040 Latex allergy status: Secondary | ICD-10-CM | POA: Diagnosis not present

## 2018-01-07 DIAGNOSIS — K625 Hemorrhage of anus and rectum: Secondary | ICD-10-CM | POA: Insufficient documentation

## 2018-01-07 DIAGNOSIS — Z980 Intestinal bypass and anastomosis status: Secondary | ICD-10-CM | POA: Diagnosis not present

## 2018-01-07 DIAGNOSIS — Z86010 Personal history of colonic polyps: Secondary | ICD-10-CM

## 2018-01-07 DIAGNOSIS — K5730 Diverticulosis of large intestine without perforation or abscess without bleeding: Secondary | ICD-10-CM | POA: Insufficient documentation

## 2018-01-07 DIAGNOSIS — Z7982 Long term (current) use of aspirin: Secondary | ICD-10-CM | POA: Diagnosis not present

## 2018-01-07 DIAGNOSIS — Z87891 Personal history of nicotine dependence: Secondary | ICD-10-CM | POA: Insufficient documentation

## 2018-01-07 DIAGNOSIS — D122 Benign neoplasm of ascending colon: Secondary | ICD-10-CM | POA: Diagnosis not present

## 2018-01-07 DIAGNOSIS — Z800 Family history of malignant neoplasm of digestive organs: Secondary | ICD-10-CM | POA: Diagnosis not present

## 2018-01-07 DIAGNOSIS — Z79899 Other long term (current) drug therapy: Secondary | ICD-10-CM | POA: Diagnosis not present

## 2018-01-07 DIAGNOSIS — F329 Major depressive disorder, single episode, unspecified: Secondary | ICD-10-CM | POA: Diagnosis not present

## 2018-01-07 DIAGNOSIS — K644 Residual hemorrhoidal skin tags: Secondary | ICD-10-CM | POA: Insufficient documentation

## 2018-01-07 DIAGNOSIS — Z888 Allergy status to other drugs, medicaments and biological substances status: Secondary | ICD-10-CM | POA: Diagnosis not present

## 2018-01-07 DIAGNOSIS — D120 Benign neoplasm of cecum: Secondary | ICD-10-CM | POA: Diagnosis not present

## 2018-01-07 DIAGNOSIS — Z8711 Personal history of peptic ulcer disease: Secondary | ICD-10-CM | POA: Diagnosis not present

## 2018-01-07 DIAGNOSIS — I129 Hypertensive chronic kidney disease with stage 1 through stage 4 chronic kidney disease, or unspecified chronic kidney disease: Secondary | ICD-10-CM | POA: Insufficient documentation

## 2018-01-07 DIAGNOSIS — M353 Polymyalgia rheumatica: Secondary | ICD-10-CM | POA: Insufficient documentation

## 2018-01-07 HISTORY — PX: POLYPECTOMY: SHX5525

## 2018-01-07 HISTORY — DX: Transient cerebral ischemic attack, unspecified: G45.9

## 2018-01-07 HISTORY — PX: COLONOSCOPY: SHX5424

## 2018-01-07 LAB — GLUCOSE, CAPILLARY: GLUCOSE-CAPILLARY: 150 mg/dL — AB (ref 65–99)

## 2018-01-07 SURGERY — COLONOSCOPY
Anesthesia: Moderate Sedation

## 2018-01-07 MED ORDER — MEPERIDINE HCL 50 MG/ML IJ SOLN
INTRAMUSCULAR | Status: DC | PRN
  Administered 2018-01-07: 10 mg via INTRAVENOUS
  Administered 2018-01-07: 15 mg via INTRAVENOUS
  Administered 2018-01-07: 25 mg via INTRAVENOUS

## 2018-01-07 MED ORDER — MIDAZOLAM HCL 5 MG/5ML IJ SOLN
INTRAMUSCULAR | Status: DC | PRN
  Administered 2018-01-07: 2 mg via INTRAVENOUS
  Administered 2018-01-07: 1 mg via INTRAVENOUS

## 2018-01-07 MED ORDER — MIDAZOLAM HCL 5 MG/5ML IJ SOLN
INTRAMUSCULAR | Status: AC
  Filled 2018-01-07: qty 10

## 2018-01-07 MED ORDER — STERILE WATER FOR IRRIGATION IR SOLN
Status: DC | PRN
  Administered 2018-01-07: 13:00:00

## 2018-01-07 MED ORDER — PANTOPRAZOLE SODIUM 20 MG PO TBEC: 20.0000 mg | DELAYED_RELEASE_TABLET | Freq: Every day | ORAL | 3 refills | Status: DC

## 2018-01-07 MED ORDER — MEPERIDINE HCL 50 MG/ML IJ SOLN
INTRAMUSCULAR | Status: AC
  Filled 2018-01-07: qty 1

## 2018-01-07 MED ORDER — SODIUM CHLORIDE 0.9 % IV SOLN
INTRAVENOUS | Status: DC
  Administered 2018-01-07: 12:00:00 via INTRAVENOUS

## 2018-01-07 NOTE — Discharge Instructions (Addendum)
Diverticulosis Diverticulosis is a condition that develops when small pouches (diverticula) form in the wall of the large intestine (colon). The colon is where water is absorbed and stool is formed. The pouches form when the inside layer of the colon pushes through weak spots in the outer layers of the colon. You may have a few pouches or many of them. What are the causes? The cause of this condition is not known. What increases the risk? The following factors may make you more likely to develop this condition:  Being older than age 27. Your risk for this condition increases with age. Diverticulosis is rare among people younger than age 36. By age 28, many people have it.  Eating a low-fiber diet.  Having frequent constipation.  Being overweight.  Not getting enough exercise.  Smoking.  Taking over-the-counter pain medicines, like aspirin and ibuprofen.  Having a family history of diverticulosis.  What are the signs or symptoms? In most people, there are no symptoms of this condition. If you do have symptoms, they may include:  Bloating.  Cramps in the abdomen.  Constipation or diarrhea.  Pain in the lower left side of the abdomen.  How is this diagnosed? This condition is most often diagnosed during an exam for other colon problems. Because diverticulosis usually has no symptoms, it often cannot be diagnosed independently. This condition may be diagnosed by:  Using a flexible scope to examine the colon (colonoscopy).  Taking an X-ray of the colon after dye has been put into the colon (barium enema).  Doing a CT scan.  How is this treated? You may not need treatment for this condition if you have never developed an infection related to diverticulosis. If you have had an infection before, treatment may include:  Eating a high-fiber diet. This may include eating more fruits, vegetables, and grains.  Taking a fiber supplement.  Taking a live bacteria supplement  (probiotic).  Taking medicine to relax your colon.  Taking antibiotic medicines.  Follow these instructions at home:  Drink 6-8 glasses of water or more each day to prevent constipation.  Try not to strain when you have a bowel movement.  If you have had an infection before: ? Eat more fiber as directed by your health care provider or your diet and nutrition specialist (dietitian). ? Take a fiber supplement or probiotic, if your health care provider approves.  Take over-the-counter and prescription medicines only as told by your health care provider.  If you were prescribed an antibiotic, take it as told by your health care provider. Do not stop taking the antibiotic even if you start to feel better.  Keep all follow-up visits as told by your health care provider. This is important. Contact a health care provider if:  You have pain in your abdomen.  You have bloating.  You have cramps.  You have not had a bowel movement in 3 days. Get help right away if:  Your pain gets worse.  Your bloating becomes very bad.  You have a fever or chills, and your symptoms suddenly get worse.  You vomit.  You have bowel movements that are bloody or black.  You have bleeding from your rectum. Summary  Diverticulosis is a condition that develops when small pouches (diverticula) form in the wall of the large intestine (colon).  You may have a few pouches or many of them.  This condition is most often diagnosed during an exam for other colon problems.  If you have had an  infection related to diverticulosis, treatment may include increasing the fiber in your diet, taking supplements, or taking medicines. This information is not intended to replace advice given to you by your health care provider. Make sure you discuss any questions you have with your health care provider. Document Released: 08/14/2004 Document Revised: 10/06/2016 Document Reviewed: 10/06/2016 Elsevier Interactive  Patient Education  2017 [place]. Colon Polyps Polyps are tissue growths inside the body. Polyps can grow in many places, including the large intestine (colon). A polyp may be a round bump or a mushroom-shaped growth. You could have one polyp or several. Most colon polyps are noncancerous (benign). However, some colon polyps can become cancerous over time. What are the causes? The exact cause of colon polyps is not known. What increases the risk? This condition is more likely to develop in people who:  Have a family history of colon cancer or colon polyps.  Are older than 54 or older than 45 if they are African American.  Have inflammatory bowel disease, such as ulcerative colitis or Crohn disease.  Are overweight.  Smoke cigarettes.  Do not get enough exercise.  Drink too much alcohol.  Eat a diet that is: ? High in fat and red meat. ? Low in fiber.  Had childhood cancer that was treated with abdominal radiation.  What are the signs or symptoms? Most polyps do not cause symptoms. If you have symptoms, they may include:  Blood coming from your rectum when having a bowel movement.  Blood in your stool.The stool may look dark red or black.  A change in bowel habits, such as constipation or diarrhea.  How is this diagnosed? This condition is diagnosed with a colonoscopy. This is a procedure that uses a lighted, flexible scope to look at the inside of your colon. How is this treated? Treatment for this condition involves removing any polyps that are found. Those polyps will then be tested for cancer. If cancer is found, your health care provider will talk to you about options for colon cancer treatment. Follow these instructions at home: Diet  Eat plenty of fiber, such as fruits, vegetables, and whole grains.  Eat foods that are high in calcium and vitamin D, such as milk, cheese, yogurt, eggs, liver, fish, and broccoli.  Limit foods high in fat, red meats, and  processed meats, such as hot dogs, sausage, bacon, and lunch meats.  Maintain a healthy weight, or lose weight if recommended by your health care provider. General instructions  Do not smoke cigarettes.  Do not drink alcohol excessively.  Keep all follow-up visits as told by your health care provider. This is important. This includes keeping regularly scheduled colonoscopies. Talk to your health care provider about when you need a colonoscopy.  Exercise every day or as told by your health care provider. Contact a health care provider if:  You have new or worsening bleeding during a bowel movement.  You have new or increased blood in your stool.  You have a change in bowel habits.  You unexpectedly lose weight. This information is not intended to replace advice given to you by your health care provider. Make sure you discuss any questions you have with your health care provider. Document Released: 08/13/2004 Document Revised: 04/24/2016 Document Reviewed: 10/08/2015 Elsevier Interactive Patient Education  2018 [employer]. Colonoscopy, Adult, Care After This sheet gives you information about how to care for yourself after your procedure. Your health care provider may also give you more specific instructions. If  you have problems or questions, contact your health care provider. What can I expect after the procedure? After the procedure, it is common to have:  A small amount of blood in your stool for 24 hours after the procedure.  Some gas.  Mild abdominal cramping or bloating.  Follow these instructions at home: General instructions   For the first 24 hours after the procedure: ? Do not drive or use machinery. ? Do not sign important documents. ? Do not drink alcohol. ? Do your regular daily activities at a slower pace than normal. ? Eat soft, easy-to-digest foods. ? Rest often.  Take over-the-counter or prescription medicines only as told by your health care  provider.  It is up to you to get the results of your procedure. Ask your health care provider, or the department performing the procedure, when your results will be ready. Relieving cramping and bloating  Try walking around when you have cramps or feel bloated.  Apply heat to your abdomen as told by your health care provider. Use a heat source that your health care provider recommends, such as a moist heat pack or a heating pad. ? Place a towel between your skin and the heat source. ? Leave the heat on for 20-30 minutes. ? Remove the heat if your skin turns bright red. This is especially important if you are unable to feel pain, heat, or cold. You may have a greater risk of getting burned. Eating and drinking  Drink enough fluid to keep your urine clear or pale yellow.  Resume your normal diet as instructed by your health care provider. Avoid heavy or fried foods that are hard to digest.  Avoid drinking alcohol for as long as instructed by your health care provider. Contact a health care provider if:  You have blood in your stool 2-3 days after the procedure. Get help right away if:  You have more than a small spotting of blood in your stool.  You pass large blood clots in your stool.  Your abdomen is swollen.  You have nausea or vomiting.  You have a fever.  You have increasing abdominal pain that is not relieved with medicine. This information is not intended to replace advice given to you by your health care provider. Make sure you discuss any questions you have with your health care provider. Document Released: 07/01/2004 Document Revised: 08/11/2016 Document Reviewed: 01/29/2016 Elsevier Interactive Patient Education  2018 [employer]. Resume aspirin tomorrow afternoon at usual dose. Resume other medications as before. Prescription for pantoprazole 20 mg by mouth 30 minutes before breakfast daily sent to pharmacy.  37-month supply with 3 refills. Resume usual diet. No  driving for 24 hours. Physician will call with biopsy results.

## 2018-01-07 NOTE — Op Note (Signed)
[facility] Patient Name: Jared Bridges Procedure Date: 01/07/2018 12:51 PM MRN: [id number] Date of Birth: 20-Nov-1930 Attending MD: Hildred Laser , MD CSN: [id number] Age: 84 Admit Type: Outpatient Procedure:                Colonoscopy Indications:              Hematochezia; history of colonic adenomas. Providers:                Hildred Laser, MD, Jeanann Lewandowsky. Sharon Seller, RN, Nelma Rothman, Technician Referring MD:             Doree Albee, MD Medicines:                Meperidine 50 mg IV, Midazolam 3 mg IV Complications:            No immediate complications. Estimated Blood Loss:     Estimated blood loss was minimal. Procedure:                Pre-Anesthesia Assessment:                           - Prior to the procedure, a History and Physical                            was performed, and patient medications and                            allergies were reviewed. The patient's tolerance of                            previous anesthesia was also reviewed. The risks                            and benefits of the procedure and the sedation                            options and risks were discussed with the patient.                            All questions were answered, and informed consent                            was obtained. Prior Anticoagulants: The patient                            last took aspirin 4 days prior to the procedure.                            ASA Grade Assessment: III - A patient with severe                            systemic disease. After reviewing the risks and  benefits, the patient was deemed in satisfactory                            condition to undergo the procedure.                           After obtaining informed consent, the colonoscope                            was passed under direct vision. Throughout the                            procedure, the patient's blood pressure, pulse, and                   oxygen saturations were monitored continuously. The                            EC-[id number] ([id number]) scope was introduced through                            the anus and advanced to the the cecum, identified                            by appendiceal orifice and ileocecal valve. The                            colonoscopy was performed without difficulty. The                            patient tolerated the procedure well. The quality                            of the bowel preparation was adequate. The                            ileocecal valve, appendiceal orifice, and rectum                            were photographed. Scope In: 1:14:32 PM Scope Out: 1:34:07 PM Scope Withdrawal Time: 0 hours 16 minutes 27 seconds  Total Procedure Duration: 0 hours 19 minutes 35 seconds  Findings:      The perianal and digital rectal examinations were normal.      Three sessile polyps were found in the ascending colon and cecum. The       polyps were small in size. These polyps were removed with a cold snare.       Resection and retrieval were complete. The pathology specimen was placed       into Bottle Number 1.      A small polyp was found in the ascending colon. The polyp was sessile.       Biopsies were taken with a cold forceps for histology. The pathology       specimen was placed into Bottle Number 1.      There was evidence of a prior end-to-side colo-colonic anastomosis in  the sigmoid colon. This was patent and was characterized by healthy       appearing mucosa.      A single medium-mouthed diverticulum was found in the sigmoid colon.      External hemorrhoids were found during retroflexion. The hemorrhoids       were small. Impression:               - Three small polyps in the ascending colon and in                            the cecum, removed with a cold snare. Resected and                            retrieved.                           - One small polyp in the  ascending colon. Biopsied.                           - Patent end-to-side colo-colonic anastomosis,                            characterized by healthy appearing mucosa.                           - Diverticulosis in the sigmoid colon.                           - External hemorrhoids. Moderate Sedation:      Moderate (conscious) sedation was administered by the endoscopy nurse       and supervised by the endoscopist. The following parameters were       monitored: oxygen saturation, heart rate, blood pressure, CO2       capnography and response to care. Total physician intraservice time was       26 minutes. Recommendation:           - Patient has a contact number available for                            emergencies. The signs and symptoms of potential                            delayed complications were discussed with the                            patient. Return to normal activities tomorrow.                            Written discharge instructions were provided to the                            patient.                           - Resume previous diet today.                           -  Continue present medications.                           - No aspirin, ibuprofen, naproxen, or other                            non-steroidal anti-inflammatory drugs for 1 day.                           - Await pathology results.                           - No recommendation at this time regarding repeat                            colonoscopy. Procedure Code(s):        --- Professional ---                           [phone], Colonoscopy, flexible; with removal of                            tumor(s), polyp(s), or other lesion(s) by snare                            technique                           45380, 59, Colonoscopy, flexible; with biopsy,                            single or multiple                           99152, Moderate sedation services provided by the                            same  physician or other qualified health care                            professional performing the diagnostic or                            therapeutic service that the sedation supports,                            requiring the presence of an independent trained                            observer to assist in the monitoring of the                            patient's level of consciousness and physiological                            status; initial 15 minutes of intraservice time,  patient age 18 years or older                           [phone], Moderate sedation services; each additional                            15 minutes intraservice time Diagnosis Code(s):        --- Professional ---                           D12.2, Benign neoplasm of ascending colon                           D12.0, Benign neoplasm of cecum                           Z98.0, Intestinal bypass and anastomosis status                           K64.4, Residual hemorrhoidal skin tags                           K92.1, Melena (includes Hematochezia)                           K57.30, Diverticulosis of large intestine without                            perforation or abscess without bleeding CPT copyright 2016 American Medical Association. All rights reserved. The codes documented in this report are preliminary and upon coder review may  be revised to meet current compliance requirements. Hildred Laser, MD Hildred Laser, MD 01/07/2018 1:45:18 PM This report has been signed electronically. Number of Addenda: 0

## 2018-01-07 NOTE — H&P (Signed)
Jared Bridges is an 84 y.o. male.   Chief Complaint: Patient is here for colonoscopy. HPI: Patient is 84 year old Caucasian male who presents with intermittent hematochezia of few months duration.  He says blood is small in amount and usually occurs with straining.  He denies abdominal pain.  He was hospitalized recently for TIA and his hemoglobin was normal.  He states he has intermittent explosive bowel movements but lately he has been having regular stools.  His colonoscopy was in February 2014 with removal of multiple small polyps and these were tubular adenomas. Family history is negative for CRC.  Past Medical History:  Diagnosis Date  . Psoriasis; recent diagnosis   . Chronic headaches    Migraines  . Chronic kidney disease    Resolved  . Depression   . Diverticulitis   . Essential hypertension   . GI bleed; PUD    Dr. Laural Golden - 1998  . Hypercholesteremia   . Orthostatic hypotension   .    [name] Kitchen Peripheral neuropathy   . Polymyalgia rheumatica ([place])   . Stroke [facility])    Residual trouble reading and writing  . Syncope   . Thyroid nodule   . TIA (transient ischemic attack) 12/2017  . Type 2 diabetes mellitus ([place])     Past Surgical History:  Procedure Laterality Date  . APPENDECTOMY     as child  . BACK SURGERY     2000 IDET SPINAL PROC  . CHOLECYSTECTOMY N/A 04/07/2016   Procedure: CHOLECYSTECTOMY;  Surgeon: Aviva Signs, MD;  Location: [facility];  Service: General;  Laterality: N/A;  . [place]  . CRYOTHERAPY    . EYE SURGERY     right cataract with lens implant  . FEMUR SURGERY     RT LEG        1953  . HAND SURGERY     RIGHT  . HERNIA REPAIR     1-right inguinal, 3- left inguinal  . IRRIGATION AND DEBRIDEMENT KNEE Left 07/09/2015   Procedure: LEFT KNEE IRRIGATION AND DEBRIDEMENT WOUND CLOSURE;  Surgeon: Gaynelle Arabian, MD;  Location: [facility];  Service: Orthopedics;  Laterality: Left;  . JOINT REPLACEMENT  02/28/2016   lt knee revision   . KNEE DEBRIDEMENT     2016   LEFT  . MENISCUS REPAIR     LEFT  . SHOULDER OPEN ROTATOR CUFF REPAIR     RT SHOULDER  . SPINAL FUSION    . SPINAL FUSION    . TONSILLECTOMY    . TOTAL KNEE ARTHROPLASTY Left 05/28/2015   Procedure: LEFT TOTAL KNEE ARTHROPLASTY;  Surgeon: Gaynelle Arabian, MD;  Location: [facility];  Service: Orthopedics;  Laterality: Left;  . TOTAL KNEE REVISION Left 02/28/2016   Procedure: LEFT TOTAL KNEE REVISION;  Surgeon: Leandrew Koyanagi, MD;  Location: [place];  Service: Orthopedics;  Laterality: Left;  . TRIGGER FINGER RELEASE     RT HAND   1990S  . UVULECTOMY  2004   TURBINATE,TONSIL,ADENOID    Family History  Problem Relation Age of Onset  . Colon cancer Sister   . Hypertension Unknown   . Depression Brother   . Depression Daughter    Social History:  reports that he quit smoking about 31 years ago. His smoking use included cigarettes. He started smoking about 59 years ago. He has a 1.80 pack-year smoking history. he has never used smokeless tobacco. He reports that he does not drink alcohol  or use drugs.  Allergies:  Allergies  Allergen Reactions  . Iodine Hives, Itching and Other (See Comments)    very allergic(per daughter), can pre-med with benadryl  . Calan [Verapamil] Other (See Comments)    Weakness  . Cymbalta [Duloxetine Hcl] Other (See Comments)    Makes patient have jerking motions.  . Iodinated Diagnostic Agents Hives, Itching and Other (See Comments)    Can pre-med with benadryl  . Latex Other (See Comments)    Redness iritation  . Lisinopril Other (See Comments)    Makes patient have jerking motions.  . Neurontin [Gabapentin] Other (See Comments)    Makes patient have jerking motions.  [name] Kitchen Zoloft [Sertraline Hcl] Diarrhea and Other (See Comments)    Makes patient have jerking motions.  . Valium [Diazepam] Other (See Comments)    "Did not work"  . Flomax [Tamsulosin Hcl] Other (See Comments)    Dizziness  . Glimepiride Other (See Comments)     "Did not work"  . Melatonin Nausea Only  . Tape Other (See Comments)    Skin irritation    Medications Prior to Admission  Medication Sig Dispense Refill  . Apremilast (OTEZLA) 10 & 20 & 30 MG TBPK Take 10-30 mg by mouth See admin instructions. Take 10 mg by mouth daily on day 1, take 10 mg by mouth twice daily on day 2, take 10 mg by mouth in the morning and take 20 mg by mouth in the evening on day 3, take 20 mg by mouth twice daily on day 4, take 20 mg by mouth in the morning and take 30 mg by mouth in the evening on day 5, take 30 mg by mouth twice daily on day 6, then continue 30 mg by mouth twice daily    . aspirin EC 81 MG EC tablet Take 2 tablets (162 mg total) by mouth daily. 60 tablet 0  . bismuth subsalicylate (PEPTO BISMOL) 262 MG/15ML suspension Take 30 mLs by mouth every 6 (six) hours as needed for diarrhea or loose stools.    . Cholecalciferol (VITAMIN D3) 5000 units CAPS Take 5,000 Units by mouth at bedtime.    . diclofenac sodium (VOLTAREN) 1 % GEL Apply 2 g topically daily as needed (for pain). 2 g 1  . escitalopram (LEXAPRO) 10 MG tablet Take 10 mg by mouth at bedtime.   1  . fluocinonide cream (LIDEX) 4.62 % Apply 1 application topically daily as needed (for irritation).     . hydrocortisone 2.5 % cream Apply 1 application topically 2 (two) times daily as needed (for psoriasis).    . pantoprazole (PROTONIX) 20 MG tablet Take 20 mg by mouth daily.     . predniSONE (DELTASONE) 5 MG tablet Take 5 mg by mouth every Tuesday.   1  . pregabalin (LYRICA) 50 MG capsule Take 50 mg by mouth at bedtime.     [name] Kitchen thyroid (NP THYROID) 15 MG tablet Take 15 mg by mouth daily before breakfast.    . topiramate (TOPAMAX) 25 MG tablet Take 25 mg by mouth 2 (two) times daily.    [name] Kitchen venlafaxine XR (EFFEXOR-XR) 37.5 MG 24 hr capsule Take 37.5 mg by mouth every morning.   3  . zolpidem (AMBIEN) 5 MG tablet Take 5-10 mg by mouth at bedtime.   1  . fluticasone (FLONASE) 50 MCG/ACT nasal spray Place 2  sprays into both nostrils daily as needed for allergies.   1    Results for orders placed or  performed during the hospital encounter of 01/07/18 (from the past 48 hour(s))  Glucose, capillary     Status: Abnormal   Collection Time: 01/07/18 12:27 PM  Result Value Ref Range   Glucose-Capillary 150 (H) 65 - 99 mg/dL   No results found.  ROS  Blood pressure (!) 132/95, pulse 66, temperature 98.5 F (36.9 C), temperature source Oral, resp. rate 13, height 6\' 1"  (1.854 m), weight 213 lb (96.6 kg), SpO2 98 %. Physical Exam  Constitutional: He appears well-developed and well-nourished.  HENT:  Mouth/Throat: Oropharynx is clear and moist.  Eyes: Conjunctivae are normal. No scleral icterus.  Neck: No thyromegaly present.  Cardiovascular: Normal rate, regular rhythm and normal heart sounds.  Grade 2/6 systolic ejection murmur best heard at aortic area.  Respiratory: Effort normal and breath sounds normal.  GI:  Scars in right lower quadrant.  Abdomen is soft and nontender without organomegaly or masses.  Musculoskeletal: He exhibits no edema.  Lymphadenopathy:    He has no cervical adenopathy.  Neurological: He is alert.  Skin: Skin is warm and dry.     Assessment/Plan Hematochezia. History of multiple colonic adenomas. Diagnostic colonoscopy.  Hildred Laser, MD 01/07/2018, 1:00 PM

## 2018-01-11 ENCOUNTER — Encounter (HOSPITAL_COMMUNITY): Payer: Self-pay | Admitting: Internal Medicine

## 2018-03-15 ENCOUNTER — Telehealth: Payer: Self-pay | Admitting: Cardiology

## 2018-03-15 NOTE — Telephone Encounter (Signed)
Jared Bridges with [facility] called stating that she is making a home visit today. Mr. Mcglynn's BP continues to drop

## 2018-03-15 NOTE — Telephone Encounter (Signed)
Luther contacted office stating patients blood pressures have been dropping when he stands. Patient also reports when he stands he is very light headed and dizzy. Nurse educated patient on standing up slowing and having something to hold on to. Nurse wanted patient seen this week. Appointment scheduled for next available with [facility] on 4/26. Will forward to provider for further recommendations. Lying BP- 120/62 Sitting BP-108/58 Standing BP- 92/52

## 2018-03-16 NOTE — Telephone Encounter (Signed)
Mr. Gilkey has a longstanding history of orthostatic intolerance. Can reassess at pending office visit.

## 2018-03-16 NOTE — Telephone Encounter (Signed)
Daughter notified and verbalized understanding. Daughter states they will keep upcoming appointment on 4/26 with [facility] and if patient declines they will take to the ER.

## 2018-03-23 DIAGNOSIS — I350 Nonrheumatic aortic (valve) stenosis: Secondary | ICD-10-CM | POA: Insufficient documentation

## 2018-03-26 ENCOUNTER — Ambulatory Visit: Payer: Self-pay | Admitting: Student

## 2018-03-30 ENCOUNTER — Other Ambulatory Visit (HOSPITAL_COMMUNITY)
Admission: RE | Admit: 2018-03-30 | Discharge: 2018-03-30 | Disposition: A | Discharge status: Home / Self Care | Payer: Medicare Other | Source: Ambulatory Visit | Attending: Urology | Admitting: Urology

## 2018-03-30 ENCOUNTER — Ambulatory Visit (INDEPENDENT_AMBULATORY_CARE_PROVIDER_SITE_OTHER): Payer: Medicare Other | Admitting: Urology

## 2018-03-30 DIAGNOSIS — R3121 Asymptomatic microscopic hematuria: Secondary | ICD-10-CM | POA: Insufficient documentation

## 2018-03-30 DIAGNOSIS — N401 Enlarged prostate with lower urinary tract symptoms: Secondary | ICD-10-CM

## 2018-03-30 DIAGNOSIS — R351 Nocturia: Secondary | ICD-10-CM

## 2018-03-30 DIAGNOSIS — R311 Benign essential microscopic hematuria: Secondary | ICD-10-CM | POA: Diagnosis not present

## 2018-04-01 ENCOUNTER — Ambulatory Visit (INDEPENDENT_AMBULATORY_CARE_PROVIDER_SITE_OTHER): Payer: Medicare Other | Admitting: Otolaryngology

## 2018-04-01 DIAGNOSIS — H903 Sensorineural hearing loss, bilateral: Secondary | ICD-10-CM

## 2018-04-01 DIAGNOSIS — H9209 Otalgia, unspecified ear: Secondary | ICD-10-CM

## 2018-04-01 DIAGNOSIS — K1121 Acute sialoadenitis: Secondary | ICD-10-CM

## 2018-04-01 DIAGNOSIS — H6121 Impacted cerumen, right ear: Secondary | ICD-10-CM | POA: Diagnosis not present

## 2018-04-05 ENCOUNTER — Ambulatory Visit: Payer: Self-pay | Admitting: Cardiology

## 2018-07-06 ENCOUNTER — Ambulatory Visit (INDEPENDENT_AMBULATORY_CARE_PROVIDER_SITE_OTHER): Payer: Medicare Other | Admitting: Urology

## 2018-07-06 DIAGNOSIS — R972 Elevated prostate specific antigen [PSA]: Secondary | ICD-10-CM

## 2018-07-06 DIAGNOSIS — R311 Benign essential microscopic hematuria: Secondary | ICD-10-CM | POA: Diagnosis not present

## 2018-07-26 ENCOUNTER — Ambulatory Visit (INDEPENDENT_AMBULATORY_CARE_PROVIDER_SITE_OTHER): Payer: Medicare Other | Admitting: Otolaryngology

## 2018-07-26 DIAGNOSIS — H9201 Otalgia, right ear: Secondary | ICD-10-CM

## 2018-07-26 DIAGNOSIS — H903 Sensorineural hearing loss, bilateral: Secondary | ICD-10-CM | POA: Diagnosis not present

## 2018-10-25 ENCOUNTER — Telehealth: Payer: Self-pay | Admitting: Cardiology

## 2018-10-25 NOTE — Telephone Encounter (Signed)
Numerous attempts to contact patient with recall letters. Unable to reach by telephone. with no success.   Jared Bridges [[id number]] 09/21/2017 3:44 PM New [10]    [System] 12/01/2017 11:03 PM Notification Sent [20]   Jared Bridges [[id number]] 03/12/2018 4:39 PM Scheduled/Linked [30]   Jared Bridges [[id number]] 03/12/2018 4:40 PM Notification Sent [20]   Jared Bridges [[id number]] 03/12/2018 4:40 PM Scheduled/Linked [30]   Jared Bridges [[id number]] 03/17/2018 8:25 AM Notification Sent [20]   Jared Bridges [[id number]] 10/25/2018 2:08 PM Notification Sent [20]

## 2019-01-16 ENCOUNTER — Other Ambulatory Visit (HOSPITAL_COMMUNITY): Payer: Self-pay | Admitting: Internal Medicine

## 2019-02-23 ENCOUNTER — Ambulatory Visit (INDEPENDENT_AMBULATORY_CARE_PROVIDER_SITE_OTHER): Payer: Medicare Other | Admitting: Urology

## 2019-02-23 ENCOUNTER — Other Ambulatory Visit: Payer: Self-pay

## 2019-02-23 DIAGNOSIS — R972 Elevated prostate specific antigen [PSA]: Secondary | ICD-10-CM

## 2019-02-23 DIAGNOSIS — R102 Pelvic and perineal pain: Secondary | ICD-10-CM | POA: Diagnosis not present

## 2019-04-19 ENCOUNTER — Other Ambulatory Visit: Payer: Self-pay

## 2019-04-19 ENCOUNTER — Ambulatory Visit
Admission: EM | Admit: 2019-04-19 | Discharge: 2019-04-19 | Disposition: A | Discharge status: Home / Self Care | Payer: Medicare Other | Attending: Emergency Medicine | Admitting: Emergency Medicine

## 2019-04-19 ENCOUNTER — Ambulatory Visit (INDEPENDENT_AMBULATORY_CARE_PROVIDER_SITE_OTHER): Payer: Medicare Other

## 2019-04-19 ENCOUNTER — Ambulatory Visit: Payer: Self-pay | Admitting: *Deleted

## 2019-04-19 ENCOUNTER — Ambulatory Visit (INDEPENDENT_AMBULATORY_CARE_PROVIDER_SITE_OTHER): Payer: Self-pay | Admitting: Internal Medicine

## 2019-04-19 DIAGNOSIS — R319 Hematuria, unspecified: Secondary | ICD-10-CM | POA: Diagnosis present

## 2019-04-19 DIAGNOSIS — B9789 Other viral agents as the cause of diseases classified elsewhere: Secondary | ICD-10-CM

## 2019-04-19 DIAGNOSIS — R0602 Shortness of breath: Secondary | ICD-10-CM

## 2019-04-19 DIAGNOSIS — Z20822 Contact with and (suspected) exposure to covid-19: Secondary | ICD-10-CM

## 2019-04-19 DIAGNOSIS — J069 Acute upper respiratory infection, unspecified: Secondary | ICD-10-CM | POA: Diagnosis present

## 2019-04-19 DIAGNOSIS — R05 Cough: Secondary | ICD-10-CM

## 2019-04-19 LAB — POCT URINALYSIS DIP (MANUAL ENTRY)
Bilirubin, UA: NEGATIVE
Glucose, UA: NEGATIVE mg/dL
Ketones, POC UA: NEGATIVE mg/dL
Leukocytes, UA: NEGATIVE
Nitrite, UA: NEGATIVE
Spec Grav, UA: 1.015 (ref 1.010–1.025)
Urobilinogen, UA: 0.2 E.U./dL
pH, UA: 6.5 (ref 5.0–8.0)

## 2019-04-19 MED ORDER — BENZONATATE 100 MG PO CAPS: 100.0000 mg | ORAL_CAPSULE | Freq: Three times a day (TID) | ORAL | 0 refills | Status: DC

## 2019-04-19 MED ORDER — FLUTICASONE PROPIONATE 50 MCG/ACT NA SUSP: 2.0000 | Freq: Every day | NASAL | 0 refills | Status: DC

## 2019-04-19 MED ORDER — CETIRIZINE HCL 5 MG PO CHEW: 5.0000 mg | CHEWABLE_TABLET | Freq: Every day | ORAL | 0 refills | Status: DC

## 2019-04-19 NOTE — ED Provider Notes (Signed)
[facility]   [id number] 04/19/19 Arrival Time: 1209  Cc: Fever, cough, SOB, AND dysuria  SUBJECTIVE: Accompanied by daughter  Jared Bridges is a 85 y.o. male hx significant for anemia, chronic headaches, CKD, depression, diverticulitis post colon resection, essential HTN, H/O GI bleed, hypercholesteremia, PUD, peripheral neuropathy, polymyalgia rheumatica, H/O stroke, H/O TIA, DM type 2, who presents with fever, with tmax of 100.3 at home, 98.8 in office, cough, SOB, "tingling" with urination, and urinary frequency, hx of BPH, x 2-3 days.  Denies sick exposure to COVID, flu or strep.  Denies recent travel.  Describes cough as intermittent and "wet" according to daughter.  Has tried excedrin with relief, none today.  Daughter reports previous symptoms with UTI's in the past.   Complains of associated fatigue, rhinorrhea, and congestion.  Denies sinus pain, sore throat, wheezing, chest pain, nausea, vomiting, abdominal pain, changes in bowel habits.    ROS: As per HPI.  Past Medical History:  Diagnosis Date  . Anemia, iron deficiency   . Chronic headaches    Migraines  . Chronic kidney disease    Resolved  . Depression   . Diverticulitis   . Essential hypertension   . GI bleed    Dr. Laural Golden - 1998  . Hypercholesteremia   . Orthostatic hypotension   . Peptic ulcer disease   . Peripheral neuropathy   . Polymyalgia rheumatica ([place])   . Stroke [facility])    Residual trouble reading and writing  . Syncope   . Thyroid nodule   . TIA (transient ischemic attack) 12/2017  . Type 2 diabetes mellitus ([place])    Past Surgical History:  Procedure Laterality Date  . APPENDECTOMY     as child  . BACK SURGERY     2000 IDET SPINAL PROC  . CHOLECYSTECTOMY N/A 04/07/2016   Procedure: CHOLECYSTECTOMY;  Surgeon: Aviva Signs, MD;  Location: [facility] ORS;  Service: General;  Laterality: N/A;  . [place]  . COLONOSCOPY N/A 01/07/2018   Procedure: COLONOSCOPY;   Surgeon: Rogene Houston, MD;  Location: [facility] ENDO SUITE;  Service: Endoscopy;  Laterality: N/A;  1:00  . CRYOTHERAPY    . EYE SURGERY     right cataract with lens implant  . FEMUR SURGERY     RT LEG        1953  . HAND SURGERY     RIGHT  . HERNIA REPAIR     1-right inguinal, 3- left inguinal  . IRRIGATION AND DEBRIDEMENT KNEE Left 07/09/2015   Procedure: LEFT KNEE IRRIGATION AND DEBRIDEMENT WOUND CLOSURE;  Surgeon: Gaynelle Arabian, MD;  Location: [facility];  Service: Orthopedics;  Laterality: Left;  . JOINT REPLACEMENT  02/28/2016   lt knee revision  . KNEE DEBRIDEMENT     2016   LEFT  . MENISCUS REPAIR     LEFT  . POLYPECTOMY  01/07/2018   Procedure: POLYPECTOMY;  Surgeon: Rogene Houston, MD;  Location: [facility];  Service: Endoscopy;;  colon   . SHOULDER OPEN ROTATOR CUFF REPAIR     RT SHOULDER  . SPINAL FUSION    . SPINAL FUSION    . TONSILLECTOMY    . TOTAL KNEE ARTHROPLASTY Left 05/28/2015   Procedure: LEFT TOTAL KNEE ARTHROPLASTY;  Surgeon: Gaynelle Arabian, MD;  Location: [facility];  Service: Orthopedics;  Laterality: Left;  . TOTAL KNEE REVISION Left 02/28/2016   Procedure: LEFT TOTAL KNEE REVISION;  Surgeon: Marylynn Pearson  Erlinda Hong, MD;  Location: [facility];  Service: Orthopedics;  Laterality: Left;  . TRIGGER FINGER RELEASE     RT HAND   1990S  . UVULECTOMY  2004   TURBINATE,TONSIL,ADENOID   Allergies  Allergen Reactions  . Iodine Hives, Itching and Other (See Comments)    very allergic(per daughter), can pre-med with benadryl  . Calan [Verapamil] Other (See Comments)    Weakness  . Cymbalta [Duloxetine Hcl] Other (See Comments)    Makes patient have jerking motions.  . Iodinated Diagnostic Agents Hives, Itching and Other (See Comments)    Can pre-med with benadryl  . Latex Other (See Comments)    Redness iritation  . Lisinopril Other (See Comments)    Makes patient have jerking motions.  . Neurontin [Gabapentin] Other (See Comments)    Makes patient have jerking motions.  [name] Kitchen Zoloft  [Sertraline Hcl] Diarrhea and Other (See Comments)    Makes patient have jerking motions.  . Valium [Diazepam] Other (See Comments)    "Did not work"  . Flomax [Tamsulosin Hcl] Other (See Comments)    Dizziness  . Glimepiride Other (See Comments)    "Did not work"  . Melatonin Nausea Only  . Tape Other (See Comments)    Skin irritation   No current facility-administered medications on file prior to encounter.    Current Outpatient Medications on File Prior to Encounter  Medication Sig Dispense Refill  . Apremilast (OTEZLA) 10 & 20 & 30 MG TBPK Take 10-30 mg by mouth See admin instructions. Take 10 mg by mouth daily on day 1, take 10 mg by mouth twice daily on day 2, take 10 mg by mouth in the morning and take 20 mg by mouth in the evening on day 3, take 20 mg by mouth twice daily on day 4, take 20 mg by mouth in the morning and take 30 mg by mouth in the evening on day 5, take 30 mg by mouth twice daily on day 6, then continue 30 mg by mouth twice daily    . Apremilast 10 & 20 & 30 MG TBPK Take 30 mg by mouth 2 (two) times a day.    [name] Kitchen aspirin 81 MG EC tablet Take 2 tablets (162 mg total) by mouth daily. 60 tablet 0  . atorvastatin (LIPITOR) 40 MG tablet Take 40 mg by mouth daily.    [name] Kitchen bismuth subsalicylate (PEPTO BISMOL) 262 MG/15ML suspension Take 30 mLs by mouth every 6 (six) hours as needed for diarrhea or loose stools.    . Cholecalciferol (VITAMIN D3) 5000 units CAPS Take 5,000 Units by mouth at bedtime.    . diclofenac sodium (VOLTAREN) 1 % GEL Apply 2 g topically daily as needed (for pain). 2 g 1  . escitalopram (LEXAPRO) 10 MG tablet Take 10 mg by mouth at bedtime.   1  . finasteride (PROSCAR) 5 MG tablet Take 5 mg by mouth daily.    . fluocinonide cream (LIDEX) 4.40 % Apply 1 application topically daily as needed (for irritation).     . hydrocortisone 2.5 % cream Apply 1 application topically 2 (two) times daily as needed (for psoriasis).    . pantoprazole (PROTONIX) 20 MG tablet  TAKE ONE TABLET BY MOUTH DAILY BEFORE BREAKFAST 90 tablet 3  . predniSONE (DELTASONE) 5 MG tablet Take 5 mg by mouth every Tuesday.   1  . pregabalin (LYRICA) 50 MG capsule Take 50 mg by mouth at bedtime.     [name] Kitchen thyroid (NP THYROID)  15 MG tablet Take 15 mg by mouth daily before breakfast.    . topiramate (TOPAMAX) 25 MG tablet Take 25 mg by mouth 2 (two) times daily.    [name] Kitchen venlafaxine XR (EFFEXOR-XR) 37.5 MG 24 hr capsule Take 37.5 mg by mouth every morning.   3  . zolpidem (AMBIEN) 5 MG tablet Take 5-10 mg by mouth at bedtime.   1    Social History   Socioeconomic History  . Marital status: Married    Spouse name: Not on file  . Number of children: Not on file  . Years of education: Not on file  . Highest education level: Not on file  Occupational History  . Not on file  Social Needs  . Financial resource strain: Not on file  . Food insecurity:    Worry: Not on file    Inability: Not on file  . Transportation needs:    Medical: Not on file    Non-medical: Not on file  Tobacco Use  . Smoking status: Former Smoker    Packs/day: 0.10    Years: 18.00    Pack years: 1.80    Types: Cigarettes    Start date: 12/18/1958    Last attempt to quit: 12/01/1986    Years since quitting: 32.4  . Smokeless tobacco: Never Used  Substance and Sexual Activity  . Alcohol use: No    Alcohol/week: 0.0 standard drinks  . Drug use: No  . Sexual activity: Never  Lifestyle  . Physical activity:    Days per week: Not on file    Minutes per session: Not on file  . Stress: Not on file  Relationships  . Social connections:    Talks on phone: Not on file    Gets together: Not on file    Attends religious service: Not on file    Active member of club or organization: Not on file    Attends meetings of clubs or organizations: Not on file    Relationship status: Not on file  . Intimate partner violence:    Fear of current or ex partner: Not on file    Emotionally abused: Not on file    Physically  abused: Not on file    Forced sexual activity: Not on file  Other Topics Concern  . Not on file  Social History Narrative  . Not on file   Family History  Problem Relation Age of Onset  . Colon cancer Sister   . Hypertension Other   . Depression Brother   . Depression Daughter      OBJECTIVE:  Vitals:   04/19/19 1226  BP: (!) 145/69  Pulse: 68  Resp: 20  Temp: 98.9 F (37.2 C)  TempSrc: Oral  SpO2: 96%     General appearance: Alert, appears fatigued, but nontoxic; speaking in full sentences without difficulty HEENT:NCAT; Ears: hearing aids present, EACs clear, TMs pearly gray; Eyes: PERRL.  EOM grossly intact. Nose: nares patent with copious clear rhinorrhea; Throat: tonsils nonerythematous or enlarged, uvula absent Neck: supple without LAD Lungs: clear to auscultation bilaterally without adventitious breath sounds; normal respiratory effort; cough absent Heart: Systolic murmur present.  Radial pulses 2+ symmetrical bilaterally Abdomen: soft, nondistended, normal active bowel sounds; nontender to palpation; no guarding  Skin: clammy Psychological: alert and cooperative; normal mood and affect  DIAGNOSTIC STUDIES:  Dg Chest 2 View  Result Date: 04/19/2019 CLINICAL DATA:  Cough, shortness of breath. EXAM: CHEST - 2 VIEW COMPARISON:  Radiographs of December 28, 2017. FINDINGS: The heart size and mediastinal contours are within normal limits. Both lungs are clear. No pneumothorax or pleural effusion is noted. The visualized skeletal structures are unremarkable. IMPRESSION: No active cardiopulmonary disease. Electronically Signed   By: Marijo Conception M.D.   On: 04/19/2019 13:30    LABS:   Results for orders placed or performed during the hospital encounter of 04/19/19 (from the past 24 hour(s))  POCT urinalysis dipstick     Status: Abnormal   Collection Time: 04/19/19  1:54 PM  Result Value Ref Range   Color, UA straw (A) yellow   Clarity, UA clear clear   Glucose, UA  negative negative mg/dL   Bilirubin, UA negative negative   Ketones, POC UA negative negative mg/dL   Spec Grav, UA 1.015 1.010 - 1.025   Blood, UA moderate (A) negative   pH, UA 6.5 5.0 - 8.0   Protein Ur, POC trace (A) negative mg/dL   Urobilinogen, UA 0.2 0.2 or 1.0 E.U./dL   Nitrite, UA Negative Negative   Leukocytes, UA Negative Negative     ASSESSMENT & PLAN:  1. Viral URI with cough   2. Hematuria, unspecified type     Meds ordered this encounter  Medications  . cetirizine (ZYRTEC) 5 MG chewable tablet    Sig: Chew 1 tablet (5 mg total) by mouth daily.    Dispense:  20 tablet    Refill:  0    Order Specific Question:   Supervising Provider    Answer:   Raylene Everts [[id number]]  . fluticasone (FLONASE) 50 MCG/ACT nasal spray    Sig: Place 2 sprays into both nostrils daily.    Dispense:  16 g    Refill:  0    Order Specific Question:   Supervising Provider    Answer:   Raylene Everts [[id number]]  . benzonatate (TESSALON) 100 MG capsule    Sig: Take 1 capsule (100 mg total) by mouth every 8 (eight) hours.    Dispense:  21 capsule    Refill:  0    Order Specific Question:   Supervising Provider    Answer:   Raylene Everts [[id number]]    Orders Placed This Encounter  Procedures  . Urine Culture    Standing Status:   Standing    Number of Occurrences:   1  . DG Chest 2 View    Standing Status:   Standing    Number of Occurrences:   1    Order Specific Question:   Reason for Exam (SYMPTOM  OR DIAGNOSIS REQUIRED)    Answer:   cough SOB fever x 2-3 days  . POCT urinalysis dipstick    Standing Status:   Standing    Number of Occurrences:   1    Chest x-ray not concerning for cardiopulmonary disease Urine showed blood, but not concerning features of infection Reports hx of blood in urine per daughter.   We will culture your urine and notify you of abnormal results COVID testing ordered.  Outpatient center will contact you regarding your appointment  In  the meantime: You should remain isolated in your home for 7 days from symptom onset AND greater than 72 hours after symptoms resolution (absence of fever without the use of fever-reducing medication and improvement in respiratory symptoms), whichever is longer Get plenty of rest and push fluids Zyrtec and flonase prescribed as needed for congestion and/ or runny nose Tessalon perles prescribed.  Use as needed for  cough Follow up with PCP via tele-visit tomorrow for reevaluation and to ensure symptoms are improving Take OTC tylenol as needed for fever, body aches, and/or chills Call or go to the ED if you have any new or worsening symptoms such as fever, worsening cough, shortness of breath, chest tightness, chest pain, turning blue, changes in mental status, etc...  Testing site: [address] [facility] ED  Reviewed expectations re: course of current medical issues. Questions answered. Outlined signs and symptoms indicating need for more acute intervention. Patient verbalized understanding. After Visit Summary given.          Lestine Box, PA-C 04/19/19 1436

## 2019-04-19 NOTE — Telephone Encounter (Signed)
  Lestine Box, NP with an urgent care, called to schedule a patient for covid-19 testing. He has chronic co-morbidities, with cough and shortness of breath.  Pt's daughter notified of an appointment for today at the [facility] in [place]. Advised for pt to stay in wear mask, stay in car until ready for testing. Daughter voiced understanding.

## 2019-04-19 NOTE — Discharge Instructions (Addendum)
Chest x-ray not concerning for cardiopulmonary disease Urine showed blood, but not concerning features of infection.  Reports hx of blood in urine per daughter.   We will culture your urine and notify you of abnormal results COVID testing ordered.  Outpatient center will contact you regarding your appointment  In the meantime: You should remain isolated in your home for 7 days from symptom onset AND greater than 72 hours after symptoms resolution (absence of fever without the use of fever-reducing medication and improvement in respiratory symptoms), whichever is longer Get plenty of rest and push fluids Zyrtec and flonase prescribed as needed for congestion and/ or runny nose Tessalon perles prescribed.  Use as needed for cough Follow up with PCP via tele-visit tomorrow for reevaluation and to ensure symptoms are improving Take OTC tylenol as needed for fever, body aches, and/or chills Call or go to the ED if you have any new or worsening symptoms such as fever, worsening cough, shortness of breath, chest tightness, chest pain, turning blue, changes in mental status, etc...  Testing site: [address] [facility] ED

## 2019-04-19 NOTE — ED Triage Notes (Signed)
Pt has had fever and chills for past days as well as sob with increased fatigue, request by pcp to be evaluated

## 2019-04-20 LAB — URINE CULTURE: Culture: <10000 — AB

## 2019-04-26 LAB — NOVEL CORONAVIRUS, NAA: SARS-CoV-2, NAA: NOT DETECTED

## 2019-04-27 ENCOUNTER — Ambulatory Visit (INDEPENDENT_AMBULATORY_CARE_PROVIDER_SITE_OTHER): Payer: Medicare Other | Admitting: Urology

## 2019-04-27 DIAGNOSIS — N3000 Acute cystitis without hematuria: Secondary | ICD-10-CM | POA: Diagnosis not present

## 2019-04-27 DIAGNOSIS — R972 Elevated prostate specific antigen [PSA]: Secondary | ICD-10-CM

## 2019-06-15 ENCOUNTER — Ambulatory Visit (INDEPENDENT_AMBULATORY_CARE_PROVIDER_SITE_OTHER): Payer: Medicare Other | Admitting: Urology

## 2019-06-15 DIAGNOSIS — R972 Elevated prostate specific antigen [PSA]: Secondary | ICD-10-CM | POA: Diagnosis not present

## 2019-07-28 ENCOUNTER — Ambulatory Visit (INDEPENDENT_AMBULATORY_CARE_PROVIDER_SITE_OTHER): Payer: Medicare Other | Admitting: Otolaryngology

## 2019-08-17 ENCOUNTER — Other Ambulatory Visit (INDEPENDENT_AMBULATORY_CARE_PROVIDER_SITE_OTHER): Payer: Self-pay | Admitting: Internal Medicine

## 2019-08-29 ENCOUNTER — Other Ambulatory Visit: Payer: Self-pay

## 2019-08-29 ENCOUNTER — Encounter (INDEPENDENT_AMBULATORY_CARE_PROVIDER_SITE_OTHER): Payer: Self-pay | Admitting: Internal Medicine

## 2019-08-29 ENCOUNTER — Ambulatory Visit (INDEPENDENT_AMBULATORY_CARE_PROVIDER_SITE_OTHER): Payer: Medicare Other | Admitting: Internal Medicine

## 2019-08-29 VITALS — BP 118/60 | HR 72 | Ht 72.0 in | Wt 201.6 lb

## 2019-08-29 DIAGNOSIS — L03213 Periorbital cellulitis: Secondary | ICD-10-CM

## 2019-08-29 DIAGNOSIS — H00015 Hordeolum externum left lower eyelid: Secondary | ICD-10-CM | POA: Diagnosis not present

## 2019-08-29 MED ORDER — AMOXICILLIN-POT CLAVULANATE 875-125 MG PO TABS: 1.0000 | ORAL_TABLET | Freq: Two times a day (BID) | ORAL | 0 refills | Status: DC

## 2019-08-29 NOTE — Progress Notes (Signed)
Wellness Office Visit  Subjective:  Patient ID: Jared Bridges, male    DOB: 01-07-1930  Age: 85 y.o. MRN: [id number]  CC: This man comes in for an acute visit with left red eye. HPI This is been present for the last few days and he has noticed redness inferior to the left lower lid also.  He denies any fever.  There are no visual changes at all.  Past Medical History:  Diagnosis Date  . Anemia, iron deficiency   . Chronic headaches    Migraines  . Chronic kidney disease    Resolved  . Depression   . Diverticulitis   . Essential hypertension   . GI bleed    Dr. Laural Golden - 1998  . Hypercholesteremia   . Orthostatic hypotension   . Peptic ulcer disease   . Peripheral neuropathy   . Polymyalgia rheumatica ([place])   . Stroke [facility])    Residual trouble reading and writing  . Syncope   . Thyroid nodule   . TIA (transient ischemic attack) 12/2017  . Type 2 diabetes mellitus ([name]       Family History  Problem Relation Age of Onset  . Colon cancer Sister   . Hypertension Other   . Depression Brother   . 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     Current Meds  Medication Sig  . Apremilast (OTEZLA) 10 & 20 & 30 MG TBPK Take 10-30 mg by mouth See admin instructions. Take 10 mg by mouth daily on day 1, take 10 mg by mouth twice daily on day 2, take 10 mg by mouth in the morning and take 20 mg by mouth in the evening on day 3, take 20 mg by mouth twice daily on day 4, take 20 mg by mouth in the morning and take 30 mg by mouth in the evening on day 5, take 30 mg by mouth twice daily on day 6, then continue 30 mg by mouth twice daily  . Apremilast 10 & 20 & 30 MG TBPK Take 30 mg by mouth 2 (two) times a day.  [name] Kitchen aspirin 81 MG EC tablet Take 2 tablets (162 mg total) by mouth daily.  [name] Kitchen atorvastatin (LIPITOR) 40 MG tablet Take 40 mg by mouth daily.  . cetirizine (ZYRTEC) 5 MG chewable tablet Chew 1  tablet (5 mg total) by mouth daily.  . Cholecalciferol (VITAMIN D3) 5000 units CAPS Take 5,000 Units by mouth at bedtime.  [name] Kitchen escitalopram (LEXAPRO) 10 MG tablet Take 20 mg by mouth at bedtime.   . finasteride (PROSCAR) 5 MG tablet Take 5 mg by mouth daily.  . Melatonin 5 MG CAPS Take 1 capsule by mouth every evening.  . midodrine (PROAMATINE) 10 MG tablet Take 15 mg by mouth 3 (three) times daily.  . pantoprazole (PROTONIX) 20 MG tablet TAKE ONE TABLET BY MOUTH DAILY BEFORE BREAKFAST  . pregabalin (LYRICA) 50 MG capsule TAKE ONE CAPSULE BY MOUTH EVERY DAY  . topiramate (TOPAMAX) 25 MG tablet Take 25 mg by mouth 2 (two) times daily.  [name] Kitchen venlafaxine XR (EFFEXOR-XR) 37.5 MG 24 hr capsule Take 37.5 mg by mouth every morning.       Objective:   Today's Vitals: BP 118/60   Pulse 72   Ht 6' (1.829 m)   Wt 201 lb 9.6 oz (91.4 kg)   BMI 27.34 kg/m  Vitals with BMI 08/29/2019 04/19/2019  01/07/2018  Height 6\' 0"  - -  Weight 201 lbs 10 oz - -  BMI [id number] - -  Systolic 123456 [id number] -  Diastolic 60 69 -  Pulse 72 68 66  Some encounter information is confidential and restricted. Go to Review Flowsheets activity to see all data.     Physical Exam   He looks systemically well but he appears to have a stye.  More concerning, however, is what appears to be periorbital cellulitis inferior to the stye.   Assessment   1. Periorbital cellulitis of left eye   2. Hordeolum externum of left lower eyelid       Tests ordered No orders of the defined types were placed in this encounter.    Plan: 1. I recommended warm compresses for the stye. 2. I have sent a prescription for Augmentin for his periorbital cellulitis. 3. If he does not improve, he will let me know.     Doree Albee, MD

## 2019-09-14 ENCOUNTER — Other Ambulatory Visit (INDEPENDENT_AMBULATORY_CARE_PROVIDER_SITE_OTHER): Payer: Self-pay | Admitting: Internal Medicine

## 2019-09-20 ENCOUNTER — Ambulatory Visit (INDEPENDENT_AMBULATORY_CARE_PROVIDER_SITE_OTHER): Payer: Medicare Other

## 2019-09-20 DIAGNOSIS — Z23 Encounter for immunization: Secondary | ICD-10-CM | POA: Diagnosis not present

## 2019-10-25 ENCOUNTER — Encounter (INDEPENDENT_AMBULATORY_CARE_PROVIDER_SITE_OTHER): Payer: Self-pay | Admitting: Nurse Practitioner

## 2019-10-25 ENCOUNTER — Other Ambulatory Visit: Payer: Self-pay

## 2019-10-25 ENCOUNTER — Ambulatory Visit (INDEPENDENT_AMBULATORY_CARE_PROVIDER_SITE_OTHER): Payer: Medicare Other | Admitting: Nurse Practitioner

## 2019-10-25 VITALS — BP 114/66 | HR 76 | Temp 98.0°F | Resp 14 | Ht 72.5 in | Wt 203.2 lb

## 2019-10-25 DIAGNOSIS — R413 Other amnesia: Secondary | ICD-10-CM

## 2019-10-25 DIAGNOSIS — E1143 Type 2 diabetes mellitus with diabetic autonomic (poly)neuropathy: Secondary | ICD-10-CM | POA: Diagnosis not present

## 2019-10-25 DIAGNOSIS — I10 Essential (primary) hypertension: Secondary | ICD-10-CM

## 2019-10-25 DIAGNOSIS — I951 Orthostatic hypotension: Secondary | ICD-10-CM

## 2019-10-25 DIAGNOSIS — E559 Vitamin D deficiency, unspecified: Secondary | ICD-10-CM

## 2019-10-25 DIAGNOSIS — W19XXXA Unspecified fall, initial encounter: Secondary | ICD-10-CM | POA: Diagnosis not present

## 2019-10-25 NOTE — Assessment & Plan Note (Signed)
See plan under orthostatic hypotension

## 2019-10-25 NOTE — Assessment & Plan Note (Signed)
Blood work will be collected today for further evaluation.  Further recommendations may be made based on these results.

## 2019-10-25 NOTE — Assessment & Plan Note (Signed)
I will check blood work today including hemoglobin A1c for further evaluation.  Further recommendations may be made based on this result.

## 2019-10-25 NOTE — Assessment & Plan Note (Signed)
I will check blood work today for further evaluation.  He will follow-up with neurology as scheduled.  Further recommendations may be made based on his blood work from today.

## 2019-10-25 NOTE — Patient Instructions (Signed)
Thank you for choosing [facility] as your medical provider! If you have any questions or concerns regarding your health care, please do not hesitate to call our office.  What we discussed today: 1.  Fall: Continue to reduce risk of future falls by using your walker, holding handrails when available on stairs, keep walkways clear in the home, make sure you have decent lighting in your home as well.  If you are to fall especially if you are to hit your head you need to proceed to the emergency department for further evaluation. 2.  Mild confusion: I am checking blood work for further evaluation.  Make sure to follow-up with neurology as scheduled 3.  Gait instability: Continue to follow-up with neurology as scheduled 4.  Vitamin D deficiency and diabetes: I am checking blood work today for further evaluation.  I will let you know if we need to make any changes to medications.  Please follow-up as scheduled in 2 months. We look forward to seeing you again soon! Have a great Thanksgiving!!  At [facility] we value your feedback. You may receive a survey about your visit today. Please share your experience as we strive to create trusting relationships with our patients to provide genuine, compassionate, quality care.  We appreciate your understanding and patience as we review any laboratory studies, imaging, and other diagnostic tests that are ordered as we care for you. We do our best to address any and all results in a timely manner. If you do not hear about test results within 1 week, please do not hesitate to contact us. If we referred you to a specialist during your visit or ordered imaging testing, contact the office if you have not been contacted to be scheduled within 1 weeks.  We also encourage the use of MyChart, which contains your medical information for your review as well. If you are not enrolled in this feature, an access code is on this after visit summary for your  convenience. Thank you for allowing [name] to be involved in your care.

## 2019-10-25 NOTE — Progress Notes (Addendum)
Subjective:  Patient ID: Jared Bridges, male    DOB: 05/16/1930  Age: 85 y.o. MRN: [id number]  CC:  Chief Complaint  Patient presents with  . Fall  . Diabetes  . other    vitamin d deficiency  . Hypertension  . other    confusion      HPI  Fall: The patient tells me he had a fall yesterday at home.  He denies hitting his head. He was trying to pull out his smoker to prepare for smoking his Thanksgiving [place], his hand slipped he lost his balance, and he fell on his right shoulder.  He tells me he does have pain to his right side but this seems to be improving.  Range of motion is not affected.  Patient has worked with home health physical therapy in the past, but tells me he had a poor experience and is unwilling to try home health physical therapy again.  Diabetes: He has a history of diabetes and is not currently on any medication to control this.  Last A1c was 6.5.  He is also on a statin.  I do not see that he is on an ACE or an ARB.  Vitamin D deficiency: He has a history of vitamin D deficiency and continues on 5000 international units of vitamin D3 daily.  Hypertension: He has a history of hypertension.  Most recently he is actually had postural hypotension.  He is currently on midodrine 10 mg 3 times a day for the treatment of this.  He is not on any antihypertensive medications.  Confusion: He does report today some mild intermittent confusion.  He is concerned about this as he has always considered himself as a "sharp" individual.  He is being followed by neurology for his abnormal gait.  He tells me he is scheduled to see them again in February.  He is accompanied today to the office visit by his daughter.  Past Medical History:  Diagnosis Date  . Anemia, iron deficiency   . Chronic headaches    Migraines  . Chronic kidney disease    Resolved  . Depression   . Diverticulitis   . Essential hypertension   . GI bleed    Dr. Laural Golden - 1998  .  Hypercholesteremia   . Orthostatic hypotension   . Peptic ulcer disease   . Peripheral neuropathy   . Polymyalgia rheumatica ([place])   . Stroke [facility])    Residual trouble reading and writing  . Syncope   . Thyroid nodule   . TIA (transient ischemic attack) 12/2017  . Type 2 diabetes mellitus ([name]       Family History  Problem Relation Age of Onset  . Colon cancer Sister   . Hypertension Other   . Depression Brother   . 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   Social History   Tobacco Use  . Smoking status: Former Smoker    Packs/day: 0.10    Years: 18.00    Pack years: 1.80    Types: Cigarettes    Start date: 12/18/1958    Quit date: 12/01/1986    Years since quitting: 32.9  . Smokeless tobacco: Never Used  Substance Use Topics  . Alcohol use: No    Alcohol/week: 0.0 standard drinks     Current Meds  Medication Sig  . Apremilast (OTEZLA) 10 & 20 & 30 MG TBPK  Take 10-30 mg by mouth See admin instructions. Take 10 mg by mouth daily on day 1, take 10 mg by mouth twice daily on day 2, take 10 mg by mouth in the morning and take 20 mg by mouth in the evening on day 3, take 20 mg by mouth twice daily on day 4, take 20 mg by mouth in the morning and take 30 mg by mouth in the evening on day 5, take 30 mg by mouth twice daily on day 6, then continue 30 mg by mouth twice daily  . atorvastatin (LIPITOR) 40 MG tablet Take 40 mg by mouth daily.  . Cholecalciferol (VITAMIN D3) 5000 units CAPS Take 5,000 Units by mouth at bedtime.  . diclofenac sodium (VOLTAREN) 1 % GEL Apply 2 g topically daily as needed (for pain).  [name] Kitchen escitalopram (LEXAPRO) 10 MG tablet Take 20 mg by mouth at bedtime.   . finasteride (PROSCAR) 5 MG tablet Take 5 mg by mouth daily.  . fluticasone (FLONASE) 50 MCG/ACT nasal spray Place 2 sprays into both nostrils daily.  . midodrine (PROAMATINE) 10 MG tablet Take 10 mg by mouth 3  (three) times daily.   . pantoprazole (PROTONIX) 20 MG tablet TAKE ONE TABLET BY MOUTH DAILY BEFORE BREAKFAST  . pregabalin (LYRICA) 50 MG capsule TAKE ONE CAPSULE BY MOUTH EVERY DAY  . topiramate (TOPAMAX) 25 MG tablet Take 50 mg by mouth 2 (two) times daily.   [name] Kitchen venlafaxine XR (EFFEXOR-XR) 37.5 MG 24 hr capsule TAKE ONE CAPSULE BY MOUTH EVERY MORNING    ROS:  Review of Systems  Constitutional: Negative for chills and fever.  Eyes: Positive for blurred vision.       Recently saw eye doctor has new pair of glasses, can see clearly with these  Respiratory: Negative for cough and wheezing.   Genitourinary: Negative for dysuria.       (+) nocturia  Musculoskeletal: Positive for back pain and myalgias.       Right sided chest wall, right shoulder pain  Neurological: Positive for dizziness.  Endo/Heme/Allergies: Negative for polydipsia.     Objective:   Today's Vitals: BP 114/66   Pulse 76   Temp 98 F (36.7 C)   Resp 14   Ht 6' 0.5" (1.842 m)   Wt 203 lb 3.2 oz (92.2 kg)   SpO2 97%   BMI 27.18 kg/m  Vitals with BMI 10/25/2019 08/29/2019 04/19/2019  Height 6' 0.5" 6\' 0"  -  Weight 203 lbs 3 oz 201 lbs 10 oz -  BMI [id number] -  Systolic [id number]  Diastolic 66 60 69  Pulse 76 72 68  Some encounter information is confidential and restricted. Go to Review Flowsheets activity to see all data.     Physical Exam Vitals signs reviewed.  Constitutional:      Appearance: Normal appearance.  HENT:     Head: Normocephalic and atraumatic.  Cardiovascular:     Rate and Rhythm: Normal rate and regular rhythm.     Heart sounds: Murmur present.  Pulmonary:     Effort: Pulmonary effort is normal. No respiratory distress.     Breath sounds: No wheezing.  Musculoskeletal: Normal range of motion.        General: No swelling.  Skin:    General: Skin is warm and dry.  Neurological:     Mental Status: He is alert and oriented to person, place, and time.     Gait: Gait abnormal  (slow gait, appears steady  with use of wlaker).  Psychiatric:        Mood and Affect: Mood normal.        Behavior: Behavior normal.        Thought Content: Thought content normal.          Assessment   1. Memory loss   2. Type 2 diabetes mellitus with diabetic autonomic neuropathy, without long-term current use of insulin ([place])   3. Vitamin D deficiency   4. Fall, initial encounter   5. Essential hypertension   6. Orthostatic hypotension       Tests ordered Orders Placed This Encounter  Procedures  . Vitamin D, 25-hydroxy  . Hemoglobin A1c  . Basic Metabolic Panel  . Homocysteine  . Folate  . Methylmalonic Acid, Serum  . Vitamin B12  . Urinalysis with Culture Reflex  . TSH     Plan: Please see assessment and plan per problem list below.   No orders of the defined types were placed in this encounter.   Patient to follow-up in 2 months as scheduled for annual wellness visit and office visit.  Ailene Ards, NP

## 2019-10-25 NOTE — Assessment & Plan Note (Signed)
Patient will continue on medication as prescribed.  We also discussed how to prevent falls in the future such as using walker consistently and regularly, removing items from walkways, using adequate lighting in the home, and using railings when walking upstairs.  He was also encouraged to follow-up with neurology as scheduled.  UA dipstick in the office was negative today, EKG appears unchanged from last EKG back in January 2019.

## 2019-10-28 LAB — BASIC METABOLIC PANEL
BUN/Creatinine Ratio: 14 (calc) (ref 6–22)
BUN: 18 mg/dL (ref 7–25)
CO2: 21 mmol/L (ref 20–32)
Calcium: 9.2 mg/dL (ref 8.6–10.3)
Chloride: 109 mmol/L (ref 98–110)
Creat: 1.33 mg/dL — ABNORMAL HIGH (ref 0.70–1.11)
Glucose, Bld: 185 mg/dL — ABNORMAL HIGH (ref 65–99)
Potassium: 4.2 mmol/L (ref 3.5–5.3)
Sodium: 139 mmol/L (ref 135–146)

## 2019-10-28 LAB — URINALYSIS W MICROSCOPIC + REFLEX CULTURE
Bacteria, UA: NONE SEEN /HPF
Bilirubin Urine: NEGATIVE
Glucose, UA: NEGATIVE
Hgb urine dipstick: NEGATIVE
Hyaline Cast: NONE SEEN /LPF
Ketones, ur: NEGATIVE
Leukocyte Esterase: NEGATIVE
Nitrites, Initial: NEGATIVE
Protein, ur: NEGATIVE
Specific Gravity, Urine: 1.017 (ref 1.001–1.03)
Squamous Epithelial / LPF: NONE SEEN /HPF (ref <=5)
pH: 6.5 (ref 5.0–8.0)

## 2019-10-28 LAB — HOMOCYSTEINE: Homocysteine: 13.9 umol/L — ABNORMAL HIGH (ref <11.4)

## 2019-10-28 LAB — HEMOGLOBIN A1C
Hgb A1c MFr Bld: 6.7 % of total Hgb — ABNORMAL HIGH (ref <5.7)
Mean Plasma Glucose: 146 (calc)
eAG (mmol/L): 8.1 (calc)

## 2019-10-28 LAB — VITAMIN D 25 HYDROXY (VIT D DEFICIENCY, FRACTURES): Vit D, 25-Hydroxy: 55 ng/mL (ref 30–100)

## 2019-10-28 LAB — METHYLMALONIC ACID, SERUM: Methylmalonic Acid, Quant: 320 nmol/L — ABNORMAL HIGH (ref 87–318)

## 2019-10-28 LAB — NO CULTURE INDICATED

## 2019-10-28 LAB — VITAMIN B12: Vitamin B-12: 277 pg/mL (ref 200–1100)

## 2019-10-28 LAB — FOLATE: Folate: 17.6 ng/mL

## 2019-10-28 LAB — TSH: TSH: 1.06 mIU/L (ref 0.40–4.50)

## 2019-10-30 ENCOUNTER — Encounter (INDEPENDENT_AMBULATORY_CARE_PROVIDER_SITE_OTHER): Payer: Self-pay | Admitting: Internal Medicine

## 2019-10-30 ENCOUNTER — Other Ambulatory Visit (INDEPENDENT_AMBULATORY_CARE_PROVIDER_SITE_OTHER): Payer: Self-pay | Admitting: Internal Medicine

## 2019-10-31 ENCOUNTER — Telehealth (INDEPENDENT_AMBULATORY_CARE_PROVIDER_SITE_OTHER): Payer: Self-pay | Admitting: Nurse Practitioner

## 2019-10-31 NOTE — Telephone Encounter (Signed)
I was able to discuss with this patient's daughter the patient's most recent lab results.  I have recommended that he start on a vitamin B12 supplement of 1000 mcg daily to see if this helps improve his symptoms.  The daughter expresses her understanding.  The patient will follow-up as scheduled.  She was encouraged to call this office with any questions or concerns prior to his upcoming appointment.

## 2019-11-14 ENCOUNTER — Other Ambulatory Visit (INDEPENDENT_AMBULATORY_CARE_PROVIDER_SITE_OTHER): Payer: Self-pay | Admitting: Internal Medicine

## 2019-12-07 ENCOUNTER — Other Ambulatory Visit: Payer: Self-pay

## 2019-12-07 ENCOUNTER — Encounter (INDEPENDENT_AMBULATORY_CARE_PROVIDER_SITE_OTHER): Payer: Self-pay | Admitting: Nurse Practitioner

## 2019-12-07 ENCOUNTER — Ambulatory Visit (INDEPENDENT_AMBULATORY_CARE_PROVIDER_SITE_OTHER): Payer: Medicare Other | Admitting: Nurse Practitioner

## 2019-12-07 VITALS — BP 126/64 | HR 66 | Temp 97.4°F | Ht 72.5 in | Wt 204.6 lb

## 2019-12-07 DIAGNOSIS — Z0001 Encounter for general adult medical examination with abnormal findings: Secondary | ICD-10-CM

## 2019-12-07 DIAGNOSIS — R4589 Other symptoms and signs involving emotional state: Secondary | ICD-10-CM | POA: Diagnosis not present

## 2019-12-07 DIAGNOSIS — R413 Other amnesia: Secondary | ICD-10-CM | POA: Diagnosis not present

## 2019-12-07 DIAGNOSIS — Z0000 Encounter for general adult medical examination without abnormal findings: Secondary | ICD-10-CM | POA: Insufficient documentation

## 2019-12-07 NOTE — Progress Notes (Signed)
Subjective:   Jared Bridges is a 86 y.o. male who presents for Medicare Annual/Subsequent preventive examination.  He also is here to follow-up regarding his memory loss that he discussed with me at his last office appointment.  At that time it was found that he had a probable vitamin B12 deficiency and he was started on 1000 mcg daily.  He tells me he takes this as prescribed and feels that his focus, concentration, and memory have improved.  He also mentions to me that he feels his vision is getting a bit worse.  Review of Systems:  Patient states he does have intermittent fatigue, feels that his mood is mildly depressed, denies suicidal ideation, denies any cardiac symptoms, denies any abdominal symptoms, denies any respiratory symptoms, continues to have reduced sensation in his legs and feet.  As far as his mood goes he tells me he does not have much motivation to be active.  He wonders if this is a side effect of his [place].  He denies any suicidal ideation Cardiac Risk Factors include: advanced age (>35men, >28 women);diabetes mellitus;male gender;sedentary lifestyle     Objective:    Vitals: BP 126/64   Pulse 66   Temp (!) 97.4 F (36.3 C)   Ht 6' 0.5" (1.842 m)   Wt 204 lb 9.6 oz (92.8 kg)   SpO2 97%   BMI 27.37 kg/m   Body mass index is 27.37 kg/m.  Advanced Directives 12/07/2019 01/07/2018 12/29/2017 12/28/2017 01/20/2017 07/01/2016 04/05/2016  Does Patient Have a Medical Advance Directive? Yes Yes Yes Yes Yes Yes No  Type of Advance Directive Out of facility DNR (pink MOST or yellow form);Living will [place];Living will Living will;Healthcare Power of [place];Living will Living will [place];Living will -  Does patient want to make changes to medical advance directive? No - Patient declined - No - Patient declined - - - -  Copy of [place] in Chart? - No - copy requested No - copy requested  No - copy requested - - -  Would patient like information on creating a medical advance directive? - - - - - - No - patient declined information    Tobacco Social History   Tobacco Use  Smoking Status Former Smoker  . Packs/day: 0.10  . Years: 18.00  . Pack years: 1.80  . Types: Cigarettes  . Start date: 12/18/1958  . Quit date: 12/01/1986  . Years since quitting: 33.0  Smokeless Tobacco Never Used     Counseling given: Not Answered   Clinical Intake:  Pre-visit preparation completed: No  Pain : No/denies pain     Nutritional Status: BMI 25 -29 Overweight Nutritional Risks: None Diabetes: Yes CBG done?: No Did pt. bring in CBG monitor from home?: No  How often do you need to have someone help you when you read instructions, pamphlets, or other written materials from your doctor or pharmacy?: 1 - Never What is the last grade level you completed in school?: Master's Degree  Interpreter Needed?: No  Information entered by :: Jeralyn Ruths, NP-C  Past Medical History:  Diagnosis Date  . Anemia, iron deficiency   . Chronic headaches    Migraines  . Chronic kidney disease    Resolved  . Depression   . Diverticulitis   . Essential hypertension   . GI bleed    Dr. Laural Golden - 1998  . Hypercholesteremia   . Orthostatic hypotension   .  Peptic ulcer disease   . Peripheral neuropathy   . Polymyalgia rheumatica ([place])   . Stroke [facility])    Residual trouble reading and writing  . Syncope   . Thyroid nodule   . TIA (transient ischemic attack) 12/2017  . Type 2 diabetes mellitus ([place])    Past Surgical History:  Procedure Laterality Date  . APPENDECTOMY     as child  . BACK SURGERY     2000 IDET SPINAL PROC  . CHOLECYSTECTOMY N/A 04/07/2016   Procedure: CHOLECYSTECTOMY;  Surgeon: Aviva Signs, MD;  Location: [facility];  Service: General;  Laterality: N/A;  . [place]  . COLONOSCOPY N/A 01/07/2018   Procedure: COLONOSCOPY;  Surgeon: Rogene Houston, MD;  Location: [facility];  Service: Endoscopy;  Laterality: N/A;  1:00  . CRYOTHERAPY    . EYE SURGERY     right cataract with lens implant  . FEMUR SURGERY     RT LEG        1953  . HAND SURGERY     RIGHT  . HERNIA REPAIR     1-right inguinal, 3- left inguinal  . IRRIGATION AND DEBRIDEMENT KNEE Left 07/09/2015   Procedure: LEFT KNEE IRRIGATION AND DEBRIDEMENT WOUND CLOSURE;  Surgeon: Gaynelle Arabian, MD;  Location: [facility];  Service: Orthopedics;  Laterality: Left;  . JOINT REPLACEMENT  02/28/2016   lt knee revision  . KNEE DEBRIDEMENT     2016   LEFT  . MENISCUS REPAIR     LEFT  . POLYPECTOMY  01/07/2018   Procedure: POLYPECTOMY;  Surgeon: Rogene Houston, MD;  Location: [facility];  Service: Endoscopy;;  colon   . SHOULDER OPEN ROTATOR CUFF REPAIR     RT SHOULDER  . SPINAL FUSION    . SPINAL FUSION    . TONSILLECTOMY    . TOTAL KNEE ARTHROPLASTY Left 05/28/2015   Procedure: LEFT TOTAL KNEE ARTHROPLASTY;  Surgeon: Gaynelle Arabian, MD;  Location: [facility];  Service: Orthopedics;  Laterality: Left;  . TOTAL KNEE REVISION Left 02/28/2016   Procedure: LEFT TOTAL KNEE REVISION;  Surgeon: Leandrew Koyanagi, MD;  Location: [facility];  Service: Orthopedics;  Laterality: Left;  . TRIGGER FINGER RELEASE     RT HAND   1990S  . UVULECTOMY  2004   TURBINATE,TONSIL,ADENOID   Family History  Problem Relation Age of Onset  . Colon cancer Sister   . Hypertension Other   . Depression Brother   . Depression Daughter    Social History   Socioeconomic History  . Marital status: Married    Spouse name: Not on file  . Number of children: Not on file  . Years of education: Not on file  . Highest education level: Not on file  Occupational History  . Not on file  Tobacco Use  . Smoking status: Former Smoker    Packs/day: 0.10    Years: 18.00    Pack years: 1.80    Types: Cigarettes    Start date: 12/18/1958    Quit date: 12/01/1986    Years since quitting: 33.0  . Smokeless tobacco:  Never Used  Substance and Sexual Activity  . Alcohol use: No    Alcohol/week: 0.0 standard drinks  . Drug use: No  . Sexual activity: Never  Other Topics Concern  . 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   Social Determinants of  Health   Financial Resource Strain:   . Difficulty of Paying Living Expenses: Not on file  Food Insecurity:   . Worried About Charity fundraiser in the Last Year: Not on file  . Ran Out of Food in the Last Year: Not on file  Transportation Needs:   . Lack of Transportation (Medical): Not on file  . Lack of Transportation (Non-Medical): Not on file  Physical Activity:   . Days of Exercise per Week: Not on file  . Minutes of Exercise per Session: Not on file  Stress:   . Feeling of Stress : Not on file  Social Connections:   . Frequency of Communication with Friends and Family: Not on file  . Frequency of Social Gatherings with Friends and Family: Not on file  . Attends Religious Services: Not on file  . Active Member of Clubs or Organizations: Not on file  . Attends Archivist Meetings: Not on file  . Marital Status: Not on file    Outpatient Encounter Medications as of 12/07/2019  Medication Sig  . vitamin B-12 (CYANOCOBALAMIN) 500 MCG tablet Take 1,000 mcg by mouth daily.  [name] Kitchen Apremilast (OTEZLA) 10 & 20 & 30 MG TBPK Take 10-30 mg by mouth See admin instructions. Take 10 mg by mouth daily on day 1, take 10 mg by mouth twice daily on day 2, take 10 mg by mouth in the morning and take 20 mg by mouth in the evening on day 3, take 20 mg by mouth twice daily on day 4, take 20 mg by mouth in the morning and take 30 mg by mouth in the evening on day 5, take 30 mg by mouth twice daily on day 6, then continue 30 mg by mouth twice daily  . atorvastatin (LIPITOR) 40 MG tablet Take 40 mg by mouth daily.  [name] Kitchen bismuth subsalicylate (PEPTO BISMOL) 262 MG/15ML suspension Take 30 mLs by mouth every 6 (six) hours  as needed for diarrhea or loose stools.  . Cholecalciferol (VITAMIN D3) 5000 units CAPS Take 5,000 Units by mouth at bedtime.  . diclofenac sodium (VOLTAREN) 1 % GEL Apply 2 g topically daily as needed (for pain).  [name] Kitchen escitalopram (LEXAPRO) 10 MG tablet Take 20 mg by mouth at bedtime.   . finasteride (PROSCAR) 5 MG tablet Take 5 mg by mouth daily.  . fluocinonide cream (LIDEX) [id number] % Apply 1 application topically daily as needed (for irritation).   . fluticasone (FLONASE) 50 MCG/ACT nasal spray Place 2 sprays into both nostrils daily.  . hydrocortisone 2.5 % cream Apply 1 application topically 2 (two) times daily as needed (for psoriasis).  . midodrine (PROAMATINE) 10 MG tablet Take 10 mg by mouth 3 (three) times daily.   . pantoprazole (PROTONIX) 20 MG tablet TAKE ONE TABLET BY MOUTH DAILY BEFORE BREAKFAST  . pregabalin (LYRICA) 50 MG capsule TAKE ONE CAPSULE BY MOUTH EVERY DAY  . topiramate (TOPAMAX) 25 MG tablet Take 50 mg by mouth 2 (two) times daily.   [name] Kitchen venlafaxine XR (EFFEXOR-XR) 37.5 MG 24 hr capsule TAKE ONE CAPSULE BY MOUTH EVERY MORNING  . [DISCONTINUED] NP THYROID 60 MG tablet TAKE 1 TABLET BY MOUTH EVERY DAY   No facility-administered encounter medications on file as of 12/07/2019.    Activities of Daily Living In your present state of health, do you have any difficulty performing the following activities: 12/07/2019  Hearing? Y  Vision? Y  Difficulty concentrating or making decisions? Y  Walking or climbing stairs?  Y  Dressing or bathing? Y  Doing errands, shopping? Y  Preparing Food and eating ? N  Using the Toilet? N  In the past six months, have you accidently leaked urine? N  Do you have problems with loss of bowel control? N  Managing your Medications? N  Managing your Finances? N  Housekeeping or managing your Housekeeping? N  Some recent data might be hidden    Patient Care Team: Doree Albee, MD as PCP - General (Internal Medicine) [name], MD as  Consulting Physician (Cardiology)   Assessment:   This is a routine wellness examination for Jared Bridges.  Exercise Activities and Dietary recommendations Current Exercise Habits: The patient does not participate in regular exercise at present, Exercise limited by: cardiac condition(s);neurologic condition(s);orthopedic condition(s)  Goals    . Increase physical activity     Patient would like to be able to be visit the beach. He feels he does not have the energy to down to the beach by himself, but would like to go with family.  Is also looking forward to the weather getting better to start walking outside.        Fall Risk Fall Risk  12/07/2019  Number falls in past yr: 1  Injury with Fall? 1  Risk for fall due to : History of fall(s);Impaired balance/gait;Impaired mobility;Impaired vision  Follow up Falls evaluation completed;Education provided;Falls prevention discussed   Is the patient's home free of loose throw rugs in walkways, pet beds, electrical cords, etc?   yes      Grab bars in the bathroom? yes      Handrails on the stairs?   yes      Adequate lighting?   yes   Depression Screen PHQ 2/9 Scores 12/07/2019  PHQ - 2 Score 0    Cognitive Function     6CIT Screen 12/07/2019  What Year? 0 points  What month? 0 points  What time? 0 points  Count back from 20 0 points  Months in reverse 0 points  Repeat phrase 2 points  Total Score 2    Immunization History  Administered Date(s) Administered  . Influenza,inj,Quad PF,6+ Mos 09/08/2016, 09/20/2019  . Pneumococcal Conjugate-13 09/17/2017    Qualifies for Shingles Vaccine?  Yes  Screening Tests Health Maintenance  Topic Date Due  . FOOT EXAM  06/12/1940  . OPHTHALMOLOGY EXAM  06/12/1940  . URINE MICROALBUMIN  06/12/1942  . TETANUS/TDAP  06/13/1951  . PNA vac Low Risk Adult (2 of 2 - PPSV23) 09/17/2018  . HEMOGLOBIN A1C  04/23/2020  . COLONOSCOPY  01/07/2023  . INFLUENZA VACCINE  Completed   Cancer  Screenings: Lung: Low Dose CT Chest recommended if Age 43-80 years, 30 pack-year currently smoking OR have quit w/in 15years. Patient does not qualify. Colorectal: N/A -patient has aged out        Plan:   I encouraged the patient to call his eye doctor for further evaluation of his vision.  He may require additional or new prescription glasses.  I will send blood work orders to have his vitamin D levels rechecked to see if these have improved since starting his vitamin B supplement.  I also encouraged him to call the doctor that prescribes his [place] to discuss his side effects and to determine if there is another medication that they can try in its place.  He tells me he understands the above instructions.  As for health maintenance he is due for PPSV23 and tetanus vaccine.  He would like to hold off on both of these vaccinations today but would consider this in the future.  He would also qualify for shingles vaccine and should discuss this with her pharmacist if you would like to have this administered.  He would be due for sexual transmitted infection screening but would not like to undergo this screening today.  He did have a question regarding the COVID-19 vaccine and I encouraged him to discuss this with his pharmacist and once we have the vaccines available from our clinic we can administer these.  I am not sure when this will be, and recommended that he or his daughter call us periodically to check in on this.  He tells me he understands.  I have personally reviewed and noted the following in the patient's chart:   . Medical and social history . Use of alcohol, tobacco or illicit drugs  . Current medications and supplements . Functional ability and status . Nutritional status . Physical activity . Advanced directives . List of other physicians . Hospitalizations, surgeries, and ER visits in previous 12 months . Vitals . Screenings to include cognitive, depression, and  falls . Referrals and appointments  In addition, I have reviewed and discussed with patient certain preventive protocols, quality metrics, and best practice recommendations. A written personalized care plan for preventive services as well as general preventive health recommendations were provided to patient.    He will follow-up in approximately 3 months.  In addition to performing his annual wellness visit I also performed an office visit to discuss his concerns as stated above.  Ailene Ards, NP  12/07/2019

## 2019-12-07 NOTE — Patient Instructions (Addendum)
Thank you for choosing Jared Bridges as your medical provider! If you have any questions or concerns regarding your health care, please do not hesitate to call our office.  Follow-up with your eye doctor for further evaluation of deteriorating vision.  I will check your blood work to see if your vitamin B levels have improved. Call the doctor that prescribes your otezla to discuss side effects you are experiencing and to determine if there is another medication you can try in its place.   You are due for the pneumonia (PPSV23) and tetanus vaccine.  When you are ready to have these please let us know.  Also if you like to have the shingles vaccine discuss this with your pharmacist.  Please let me know if you would like to be screened for sexually transmitted infections.  Otherwise you are up-to-date with all recommended screenings for a male of your age.  You may certainly call us periodically to see when we will have the Covid 19 vaccines available.  Also consider calling your pharmacy because they may have this available shortly as well.  Please follow-up as scheduled in 3 months. We look forward to seeing you again soon! Have a great day!!  At [facility] we value your feedback. You may receive a survey about your visit today. Please share your experience as we strive to create trusting relationships with our patients to provide genuine, compassionate, quality care.  We appreciate your understanding and patience as we review any laboratory studies, imaging, and other diagnostic tests that are ordered as we care for you. We do our best to address any and all results in a timely manner. If you do not hear about test results within 1 week, please do not hesitate to contact us. If we referred you to a specialist during your visit or ordered imaging testing, contact the office if you have not been contacted to be scheduled within 1 weeks.  We also encourage the use of MyChart,  which contains your medical information for your review as well. If you are not enrolled in this feature, an access code is on this after visit summary for your convenience. Thank you for allowing [name] to be involved in your care.

## 2019-12-07 NOTE — Assessment & Plan Note (Signed)
Due for PPS V, tetanus, shingles vaccines.  He like to hold off on this for now.  Also due for STI screening but declined to have this done today.  Would like to get COVID-19 vaccine when it is available.

## 2019-12-07 NOTE — Assessment & Plan Note (Signed)
Patient mention that he feels that his mood is depressed and he has reduced motivation to do things.  Depression screen today was negative.  I did encourage him to call the doctor that prescribes his Rutherford Nail, because he feels it may be related to this medication.  I encouraged him to discuss this with the doctor to see if there is another alternative he could try or if he should consider stopping the medication based his concerns with his mood.  He currently is not having any suicidal ideation.  He tells me he understands.

## 2019-12-07 NOTE — Assessment & Plan Note (Signed)
Seem to have improved since starting vitamin B12 supplement.  Will recheck his vitamin B 12 levels for further evaluation.

## 2019-12-21 ENCOUNTER — Other Ambulatory Visit (INDEPENDENT_AMBULATORY_CARE_PROVIDER_SITE_OTHER): Payer: Medicare Other

## 2020-01-10 DIAGNOSIS — R233 Spontaneous ecchymoses: Secondary | ICD-10-CM | POA: Diagnosis not present

## 2020-01-10 DIAGNOSIS — L400 Psoriasis vulgaris: Secondary | ICD-10-CM | POA: Diagnosis not present

## 2020-01-10 DIAGNOSIS — L814 Other melanin hyperpigmentation: Secondary | ICD-10-CM | POA: Diagnosis not present

## 2020-01-10 DIAGNOSIS — L570 Actinic keratosis: Secondary | ICD-10-CM | POA: Diagnosis not present

## 2020-01-10 DIAGNOSIS — L821 Other seborrheic keratosis: Secondary | ICD-10-CM | POA: Diagnosis not present

## 2020-01-23 ENCOUNTER — Other Ambulatory Visit (INDEPENDENT_AMBULATORY_CARE_PROVIDER_SITE_OTHER): Payer: Self-pay | Admitting: Internal Medicine

## 2020-01-24 ENCOUNTER — Telehealth (INDEPENDENT_AMBULATORY_CARE_PROVIDER_SITE_OTHER): Payer: Self-pay | Admitting: Gastroenterology

## 2020-01-24 NOTE — Telephone Encounter (Signed)
Patient last seen 2 years ago. Received refill request for protonix and refilled but overdue for yearly f/up or can get medications from pcp thanks.

## 2020-02-15 DIAGNOSIS — G4733 Obstructive sleep apnea (adult) (pediatric): Secondary | ICD-10-CM | POA: Diagnosis not present

## 2020-02-20 DIAGNOSIS — R531 Weakness: Secondary | ICD-10-CM | POA: Insufficient documentation

## 2020-02-20 DIAGNOSIS — G4733 Obstructive sleep apnea (adult) (pediatric): Secondary | ICD-10-CM | POA: Insufficient documentation

## 2020-02-20 DIAGNOSIS — R4182 Altered mental status, unspecified: Secondary | ICD-10-CM | POA: Insufficient documentation

## 2020-02-20 DIAGNOSIS — G43719 Chronic migraine without aura, intractable, without status migrainosus: Secondary | ICD-10-CM | POA: Insufficient documentation

## 2020-02-20 DIAGNOSIS — E1142 Type 2 diabetes mellitus with diabetic polyneuropathy: Secondary | ICD-10-CM | POA: Insufficient documentation

## 2020-02-21 ENCOUNTER — Other Ambulatory Visit (INDEPENDENT_AMBULATORY_CARE_PROVIDER_SITE_OTHER): Payer: Self-pay | Admitting: Nurse Practitioner

## 2020-02-22 DIAGNOSIS — E1140 Type 2 diabetes mellitus with diabetic neuropathy, unspecified: Secondary | ICD-10-CM | POA: Diagnosis not present

## 2020-02-22 DIAGNOSIS — G43711 Chronic migraine without aura, intractable, with status migrainosus: Secondary | ICD-10-CM | POA: Diagnosis not present

## 2020-02-22 DIAGNOSIS — I951 Orthostatic hypotension: Secondary | ICD-10-CM | POA: Diagnosis not present

## 2020-02-22 DIAGNOSIS — E538 Deficiency of other specified B group vitamins: Secondary | ICD-10-CM | POA: Insufficient documentation

## 2020-02-22 DIAGNOSIS — M545 Low back pain: Secondary | ICD-10-CM | POA: Diagnosis not present

## 2020-02-24 ENCOUNTER — Other Ambulatory Visit (INDEPENDENT_AMBULATORY_CARE_PROVIDER_SITE_OTHER): Payer: Self-pay | Admitting: Internal Medicine

## 2020-02-29 DIAGNOSIS — L110 Acquired keratosis follicularis: Secondary | ICD-10-CM | POA: Diagnosis not present

## 2020-02-29 DIAGNOSIS — M79671 Pain in right foot: Secondary | ICD-10-CM | POA: Diagnosis not present

## 2020-02-29 DIAGNOSIS — M79672 Pain in left foot: Secondary | ICD-10-CM | POA: Diagnosis not present

## 2020-03-05 ENCOUNTER — Encounter (INDEPENDENT_AMBULATORY_CARE_PROVIDER_SITE_OTHER): Payer: Self-pay | Admitting: Internal Medicine

## 2020-03-05 MED ORDER — ATORVASTATIN CALCIUM 40 MG PO TABS: 40.0000 mg | ORAL_TABLET | Freq: Every day | ORAL | 0 refills | Status: DC

## 2020-03-06 ENCOUNTER — Other Ambulatory Visit: Payer: Self-pay

## 2020-03-06 ENCOUNTER — Encounter (INDEPENDENT_AMBULATORY_CARE_PROVIDER_SITE_OTHER): Payer: Self-pay | Admitting: Internal Medicine

## 2020-03-06 ENCOUNTER — Ambulatory Visit (INDEPENDENT_AMBULATORY_CARE_PROVIDER_SITE_OTHER): Payer: Medicare Other | Admitting: Internal Medicine

## 2020-03-06 VITALS — BP 125/85 | HR 93 | Temp 98.7°F | Ht 72.5 in | Wt 204.2 lb

## 2020-03-06 DIAGNOSIS — E782 Mixed hyperlipidemia: Secondary | ICD-10-CM | POA: Diagnosis not present

## 2020-03-06 DIAGNOSIS — I10 Essential (primary) hypertension: Secondary | ICD-10-CM

## 2020-03-06 DIAGNOSIS — R4589 Other symptoms and signs involving emotional state: Secondary | ICD-10-CM | POA: Diagnosis not present

## 2020-03-06 DIAGNOSIS — E1143 Type 2 diabetes mellitus with diabetic autonomic (poly)neuropathy: Secondary | ICD-10-CM | POA: Diagnosis not present

## 2020-03-06 NOTE — Progress Notes (Signed)
Metrics: Intervention Frequency ACO  Documented Smoking Status Yearly  Screened one or more times in 24 months  Cessation Counseling or  Active cessation medication Past 24 months  Past 24 months   Guideline developer: UpToDate (See UpToDate for funding source) Date Released: 2014       Wellness Office Visit  Subjective:  Patient ID: Jared Bridges, male    DOB: 13-May-1930  Age: 86 y.o. MRN: [id number]  CC: This man comes in for follow-up of hypertension, diabetes, depression and hyperlipidemia. HPI  The main features today seems to be that he is getting more more depressed.  He often does not want to get out of bed on a daily basis.  He does not spend the whole day in bed however. He feels that he needs to accomplish something.  He is an ex-Marine. He continues on statin therapy, antidepressants. He continues with Rutherford Nail for his psoriasis. He sees physicians at [facility] as he used to go to [facility] a long time ago. Past Medical History:  Diagnosis Date  . Anemia, iron deficiency   . Chronic headaches    Migraines  . Chronic kidney disease    Resolved  . Depression   . Diverticulitis   . Essential hypertension   . GI bleed    Dr. Laural Golden - 1998  . Hypercholesteremia   . Orthostatic hypotension   . Peptic ulcer disease   . Peripheral neuropathy   . Polymyalgia rheumatica ([place])   . Stroke [facility])    Residual trouble reading and writing  . Syncope   . Thyroid nodule   . TIA (transient ischemic attack) 12/2017  . Type 2 diabetes mellitus ([name]       Family History  Problem Relation Age of Onset  . Colon cancer Sister   . Hypertension Other   . Depression Brother   . 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   Social History   Tobacco Use  . Smoking status: Former Smoker    Packs/day: 0.10    Years: 18.00    Pack years: 1.80    Types: Cigarettes    Start date: 12/18/1958   Quit date: 12/01/1986    Years since quitting: 33.2  . Smokeless tobacco: Never Used  Substance Use Topics  . Alcohol use: No    Alcohol/week: 0.0 standard drinks    Current Meds  Medication Sig  . Apremilast (OTEZLA) 10 & 20 & 30 MG TBPK Take 10-30 mg by mouth See admin instructions. Take 10 mg by mouth daily on day 1, take 10 mg by mouth twice daily on day 2, take 10 mg by mouth in the morning and take 20 mg by mouth in the evening on day 3, take 20 mg by mouth twice daily on day 4, take 20 mg by mouth in the morning and take 30 mg by mouth in the evening on day 5, take 30 mg by mouth twice daily on day 6, then continue 30 mg by mouth twice daily  . atorvastatin (LIPITOR) 40 MG tablet Take 1 tablet (40 mg total) by mouth daily.  [name] Kitchen bismuth subsalicylate (PEPTO BISMOL) 262 MG/15ML suspension Take 30 mLs by mouth every 6 (six) hours as needed for diarrhea or loose stools.  . Cholecalciferol (VITAMIN D3) 5000 units CAPS Take 5,000 Units by mouth at bedtime.  . diclofenac sodium (VOLTAREN) 1 % GEL Apply 2 g topically daily as  needed (for pain).  [name] Kitchen escitalopram (LEXAPRO) 10 MG tablet Take 20 mg by mouth at bedtime.   . finasteride (PROSCAR) 5 MG tablet Take 5 mg by mouth daily.  . fluocinonide cream (LIDEX) [id number] % Apply 1 application topically daily as needed (for irritation).   . fluticasone (FLONASE) 50 MCG/ACT nasal spray Place 2 sprays into both nostrils daily.  . hydrocortisone 2.5 % cream Apply 1 application topically 2 (two) times daily as needed (for psoriasis).  . midodrine (PROAMATINE) 10 MG tablet Take 10 mg by mouth 3 (three) times daily.   . pantoprazole (PROTONIX) 20 MG tablet TAKE 1 TABLET BY MOUTH EVERY DAY BEFORE BREAKFAST  . pregabalin (LYRICA) 50 MG capsule TAKE ONE CAPSULE BY MOUTH EVERY DAY  . topiramate (TOPAMAX) 25 MG tablet Take 50 mg by mouth 2 (two) times daily.   [name] Kitchen venlafaxine XR (EFFEXOR-XR) 37.5 MG 24 hr capsule TAKE ONE CAPSULE BY MOUTH EVERY MORNING  . vitamin B-12  (CYANOCOBALAMIN) 500 MCG tablet Take 1,000 mcg by mouth daily.      Objective:   Today's Vitals: BP 125/85 (BP Location: Left Arm, Patient Position: Sitting, Cuff Size: Normal)   Pulse 93   Temp 98.7 F (37.1 C) (Temporal)   Ht 6' 0.5" (1.842 m)   Wt 204 lb 3.2 oz (92.6 kg)   SpO2 94%   BMI 27.31 kg/m  Vitals with BMI 03/06/2020 12/07/2019 10/25/2019  Height 6' 0.5" 6' 0.5" 6' 0.5"  Weight 204 lbs 3 oz 204 lbs 10 oz 203 lbs 3 oz  BMI 27.3 [id number]  Systolic 0000000 [id number]  Diastolic 85 64 66  Pulse 93 66 76  Some encounter information is confidential and restricted. Go to Review Flowsheets activity to see all data.     Physical Exam   He looks chronically unwell.  Blood pressure is well controlled.  Weight is stable.  Alert and orientated.  Affect is somewhat flat.    Assessment   1. Type 2 diabetes mellitus with diabetic autonomic neuropathy, without long-term current use of insulin ([place])   2. Essential hypertension   3. Depressed mood   4. Mixed hyperlipidemia       Tests ordered Orders Placed This Encounter  Procedures  . COMPLETE METABOLIC PANEL WITH GFR  . Hemoglobin A1c  . Lipid panel     Plan: 1. Blood work is ordered above. 2. His diabetes has been controlled without needing medications and we will see and make sure his A1c is not creeping up. 3. He will continue with statin therapy for his hyperlipidemia. 4. We will also continue with antidepressant medications.  He is definitely not suicidal but more of lethargic/apathy.  We discussed that he should try and accomplish something every day so that he feels that he is achieving something.  He discussed going out for walks every day now that the weather is becoming warmer and I encouraged this. 5. Further recommendations will depend on blood results and he will follow-up with Sarah in about 3 months time.   No orders of the defined types were placed in this encounter.   Doree Albee, MD

## 2020-03-07 LAB — COMPLETE METABOLIC PANEL WITH GFR
AG Ratio: 1.6 (calc) (ref 1.0–2.5)
ALT: 13 U/L (ref 9–46)
AST: 14 U/L (ref 10–35)
Albumin: 4 g/dL (ref 3.6–5.1)
Alkaline phosphatase (APISO): 72 U/L (ref 35–144)
BUN/Creatinine Ratio: 21 (calc) (ref 6–22)
BUN: 25 mg/dL (ref 7–25)
CO2: 24 mmol/L (ref 20–32)
Calcium: 9.4 mg/dL (ref 8.6–10.3)
Chloride: 108 mmol/L (ref 98–110)
Creat: 1.19 mg/dL — ABNORMAL HIGH (ref 0.70–1.11)
GFR, Est African American: 63 mL/min/{1.73_m2} (ref >=60)
GFR, Est Non African American: 55 mL/min/{1.73_m2} — ABNORMAL LOW (ref >=60)
Globulin: 2.5 g/dL (calc) (ref 1.9–3.7)
Glucose, Bld: 246 mg/dL — ABNORMAL HIGH (ref 65–99)
Potassium: 4.5 mmol/L (ref 3.5–5.3)
Sodium: 139 mmol/L (ref 135–146)
Total Bilirubin: 0.9 mg/dL (ref 0.2–1.2)
Total Protein: 6.5 g/dL (ref 6.1–8.1)

## 2020-03-07 LAB — LIPID PANEL
Cholesterol: 144 mg/dL (ref <200)
HDL: 36 mg/dL — ABNORMAL LOW (ref >=40)
LDL Cholesterol (Calc): 75 mg/dL (calc)
Non-HDL Cholesterol (Calc): 108 mg/dL (calc) (ref <130)
Total CHOL/HDL Ratio: 4 (calc) (ref <5.0)
Triglycerides: 237 mg/dL — ABNORMAL HIGH (ref <150)

## 2020-03-07 LAB — HEMOGLOBIN A1C
Hgb A1c MFr Bld: 6.8 % of total Hgb — ABNORMAL HIGH (ref <5.7)
Mean Plasma Glucose: 148 (calc)
eAG (mmol/L): 8.2 (calc)

## 2020-03-21 ENCOUNTER — Other Ambulatory Visit (INDEPENDENT_AMBULATORY_CARE_PROVIDER_SITE_OTHER): Payer: Self-pay

## 2020-03-21 ENCOUNTER — Other Ambulatory Visit (INDEPENDENT_AMBULATORY_CARE_PROVIDER_SITE_OTHER): Payer: Self-pay | Admitting: Internal Medicine

## 2020-03-21 MED ORDER — FINASTERIDE 5 MG PO TABS: 5.0000 mg | ORAL_TABLET | Freq: Every day | ORAL | 2 refills | Status: DC

## 2020-03-30 DIAGNOSIS — M545 Low back pain: Secondary | ICD-10-CM | POA: Diagnosis not present

## 2020-03-30 DIAGNOSIS — M5416 Radiculopathy, lumbar region: Secondary | ICD-10-CM | POA: Diagnosis not present

## 2020-04-18 ENCOUNTER — Ambulatory Visit (INDEPENDENT_AMBULATORY_CARE_PROVIDER_SITE_OTHER): Payer: Medicare Other | Admitting: Urology

## 2020-04-18 ENCOUNTER — Encounter: Payer: Self-pay | Admitting: Urology

## 2020-04-18 ENCOUNTER — Other Ambulatory Visit: Payer: Self-pay

## 2020-04-18 ENCOUNTER — Ambulatory Visit: Payer: Medicare Other | Admitting: Urology

## 2020-04-18 VITALS — BP 149/56 | HR 71 | Temp 97.3°F | Ht 73.0 in | Wt 197.0 lb

## 2020-04-18 DIAGNOSIS — R351 Nocturia: Secondary | ICD-10-CM | POA: Diagnosis not present

## 2020-04-18 DIAGNOSIS — R972 Elevated prostate specific antigen [PSA]: Secondary | ICD-10-CM | POA: Insufficient documentation

## 2020-04-18 DIAGNOSIS — N138 Other obstructive and reflux uropathy: Secondary | ICD-10-CM

## 2020-04-18 DIAGNOSIS — N401 Enlarged prostate with lower urinary tract symptoms: Secondary | ICD-10-CM | POA: Diagnosis not present

## 2020-04-18 MED ORDER — FINASTERIDE 5 MG PO TABS: 5.0000 mg | ORAL_TABLET | Freq: Every day | ORAL | 3 refills | Status: DC

## 2020-04-18 MED ORDER — ALFUZOSIN HCL ER 10 MG PO TB24: 10.0000 mg | ORAL_TABLET | Freq: Every day | ORAL | 11 refills | Status: DC

## 2020-04-18 NOTE — Patient Instructions (Signed)

## 2020-04-18 NOTE — Progress Notes (Signed)
04/18/2020 2:48 PM   Jared Bridges [birth year] [id number]  Referring provider: Ailene Ards, NP [place],  [place] [id number]  Nocturia  HPI: Jared Bridges is [id number] here for followup for elevated PSA and nocturia. He is on finasteride 5mg  daily. He has nocturia 3-4x. Daytime frequency every 4 hours. Stream fair. He had an episode of dysuira 1 month ago that has since resolved. Patient unable to leave specimen today    PMH: Past Medical History:  Diagnosis Date  . Anemia, iron deficiency   . Chronic headaches    Migraines  . Chronic kidney disease    Resolved  . Depression   . Diverticulitis   . Essential hypertension   . GI bleed    Dr. Laural Golden - 1998  . Hypercholesteremia   . Orthostatic hypotension   . Peptic ulcer disease   . Peripheral neuropathy   . Polymyalgia rheumatica ([place])   . Stroke [facility])    Residual trouble reading and writing  . Syncope   . Thyroid nodule   . TIA (transient ischemic attack) 12/2017  . Type 2 diabetes mellitus [facility])     Surgical History: Past Surgical History:  Procedure Laterality Date  . APPENDECTOMY     as child  . BACK SURGERY     2000 IDET SPINAL PROC  . CHOLECYSTECTOMY N/A 04/07/2016   Procedure: CHOLECYSTECTOMY;  Surgeon: Aviva Signs, MD;  Location: [facility] ORS;  Service: General;  Laterality: N/A;  . [place]  . COLONOSCOPY N/A 01/07/2018   Procedure: COLONOSCOPY;  Surgeon: Rogene Houston, MD;  Location: [facility] ENDO SUITE;  Service: Endoscopy;  Laterality: N/A;  1:00  . CRYOTHERAPY    . EYE SURGERY     right cataract with lens implant  . FEMUR SURGERY     RT LEG        1953  . HAND SURGERY     RIGHT  . HERNIA REPAIR     1-right inguinal, 3- left inguinal  . IRRIGATION AND DEBRIDEMENT KNEE Left 07/09/2015   Procedure: LEFT KNEE IRRIGATION AND DEBRIDEMENT WOUND CLOSURE;  Surgeon: Gaynelle Arabian, MD;  Location: [facility];  Service: Orthopedics;  Laterality: Left;  . JOINT REPLACEMENT   02/28/2016   lt knee revision  . KNEE DEBRIDEMENT     2016   LEFT  . MENISCUS REPAIR     LEFT  . POLYPECTOMY  01/07/2018   Procedure: POLYPECTOMY;  Surgeon: Rogene Houston, MD;  Location: [facility];  Service: Endoscopy;;  colon   . SHOULDER OPEN ROTATOR CUFF REPAIR     RT SHOULDER  . SPINAL FUSION    . SPINAL FUSION    . TONSILLECTOMY    . TOTAL KNEE ARTHROPLASTY Left 05/28/2015   Procedure: LEFT TOTAL KNEE ARTHROPLASTY;  Surgeon: Gaynelle Arabian, MD;  Location: [facility];  Service: Orthopedics;  Laterality: Left;  . TOTAL KNEE REVISION Left 02/28/2016   Procedure: LEFT TOTAL KNEE REVISION;  Surgeon: Leandrew Koyanagi, MD;  Location: [facility];  Service: Orthopedics;  Laterality: Left;  . TRIGGER FINGER RELEASE     RT HAND   1990S  . UVULECTOMY  2004   TURBINATE,TONSIL,ADENOID    Home Medications:  Allergies as of 04/18/2020      Reactions   Iodine Hives, Itching, Other (See Comments)   very allergic(per daughter), can pre-med with benadryl   Calan [verapamil] Other (See Comments)   Weakness  Cymbalta [duloxetine Hcl] Other (See Comments)   Makes patient have jerking motions.   Iodinated Diagnostic Agents Hives, Itching, Other (See Comments)   Can pre-med with benadryl   Latex Other (See Comments)   Redness iritation   Lisinopril Other (See Comments)   Makes patient have jerking motions.   Neurontin [gabapentin] Other (See Comments)   Makes patient have jerking motions.   Zoloft [sertraline Hcl] Diarrhea, Other (See Comments)   Makes patient have jerking motions.   Valium [diazepam] Other (See Comments)   "Did not work"   Flomax [tamsulosin Hcl] Other (See Comments)   Dizziness   Glimepiride Other (See Comments)   "Did not work"   Melatonin Nausea Only   Tape Other (See Comments)   Skin irritation      Medication List       Accurate as of Apr 18, 2020  2:48 PM. If you have any questions, ask your nurse or doctor.        atorvastatin 40 MG tablet Commonly known as:  LIPITOR Take 1 tablet (40 mg total) by mouth daily.   bismuth subsalicylate [id number] suspension Commonly known as: PEPTO BISMOL Take 30 mLs by mouth every 6 (six) hours as needed for diarrhea or loose stools.   diclofenac sodium 1 % Gel Commonly known as: VOLTAREN Apply 2 g topically daily as needed (for pain).   escitalopram 10 MG tablet Commonly known as: LEXAPRO Take 20 mg by mouth at bedtime.   finasteride 5 MG tablet Commonly known as: PROSCAR Take 1 tablet (5 mg total) by mouth daily.   fluocinonide cream 0.05 % Commonly known as: LIDEX Apply 1 application topically daily as needed (for irritation).   fluticasone 50 MCG/ACT nasal spray Commonly known as: FLONASE Place 2 sprays into both nostrils daily.   hydrocortisone 2.5 % cream Apply 1 application topically 2 (two) times daily as needed (for psoriasis).   midodrine 10 MG tablet Commonly known as: PROAMATINE Take 10 mg by mouth 3 (three) times daily.   Otezla 10 & 20 & 30 MG Tbpk Generic drug: Apremilast Take 10-30 mg by mouth See admin instructions. Take 10 mg by mouth daily on day 1, take 10 mg by mouth twice daily on day 2, take 10 mg by mouth in the morning and take 20 mg by mouth in the evening on day 3, take 20 mg by mouth twice daily on day 4, take 20 mg by mouth in the morning and take 30 mg by mouth in the evening on day 5, take 30 mg by mouth twice daily on day 6, then continue 30 mg by mouth twice daily   pantoprazole 20 MG tablet Commonly known as: PROTONIX TAKE 1 TABLET BY MOUTH EVERY DAY BEFORE BREAKFAST   pregabalin 50 MG capsule Commonly known as: LYRICA TAKE ONE CAPSULE BY MOUTH EVERY DAY   topiramate 25 MG tablet Commonly known as: TOPAMAX Take 50 mg by mouth 2 (two) times daily.   venlafaxine XR 37.5 MG 24 hr capsule Commonly known as: EFFEXOR-XR TAKE ONE CAPSULE BY MOUTH EVERY MORNING   vitamin B-12 500 MCG tablet Commonly known as: CYANOCOBALAMIN Take 1,000 mcg by mouth daily.    Vitamin D3 125 MCG (5000 UT) Caps Take 5,000 Units by mouth at bedtime.       Allergies:  Allergies  Allergen Reactions  . Iodine Hives, Itching and Other (See Comments)    very allergic(per daughter), can pre-med with benadryl  . Calan [Verapamil] Other (See  Comments)    Weakness  . Cymbalta [Duloxetine Hcl] Other (See Comments)    Makes patient have jerking motions.  . Iodinated Diagnostic Agents Hives, Itching and Other (See Comments)    Can pre-med with benadryl  . Latex Other (See Comments)    Redness iritation  . Lisinopril Other (See Comments)    Makes patient have jerking motions.  . Neurontin [Gabapentin] Other (See Comments)    Makes patient have jerking motions.  [name] Kitchen Zoloft [Sertraline Hcl] Diarrhea and Other (See Comments)    Makes patient have jerking motions.  . Valium [Diazepam] Other (See Comments)    "Did not work"  . Flomax [Tamsulosin Hcl] Other (See Comments)    Dizziness  . Glimepiride Other (See Comments)    "Did not work"  . Melatonin Nausea Only  . Tape Other (See Comments)    Skin irritation    Family History: Family History  Problem Relation Age of Onset  . Colon cancer Sister   . Hypertension Other   . Depression Brother   . Depression Daughter     Social History:  reports that he quit smoking about 33 years ago. His smoking use included cigarettes. He started smoking about 61 years ago. He has a 1.80 pack-year smoking history. He has never used smokeless tobacco. He reports that he does not drink alcohol or use drugs.  ROS: All other review of systems were reviewed and are negative except what is noted above in HPI  Physical Exam: BP (!) 149/56   Pulse 71   Temp (!) 97.3 F (36.3 C)   Ht 6\' 1"  (1.854 m)   Wt 197 lb (89.4 kg)   BMI 25.99 kg/m   Constitutional:  Alert and oriented, No acute distress. HEENT:  AT, moist mucus membranes.  Trachea midline, no masses. Cardiovascular: No clubbing, cyanosis, or edema. Respiratory:  Normal respiratory effort, no increased work of breathing. GI: Abdomen is soft, nontender, nondistended, no abdominal masses GU: No CVA tenderness.  Lymph: No cervical or inguinal lymphadenopathy. Skin: No rashes, bruises or suspicious lesions. Neurologic: Grossly intact, no focal deficits, moving all 4 extremities. Psychiatric: Normal mood and affect.  Laboratory Data: Lab Results  Component Value Date   WBC 8.5 12/28/2017   HGB 14.3 12/28/2017   HCT 42.0 12/28/2017   MCV 99.1 12/28/2017   PLT 210 12/28/2017    Lab Results  Component Value Date   CREATININE 1.19 (H) 03/06/2020    No results found for: PSA  No results found for: TESTOSTERONE  Lab Results  Component Value Date   HGBA1C 6.8 (H) 03/06/2020    Urinalysis    Component Value Date/Time   COLORURINE YELLOW 10/25/2019 1403   APPEARANCEUR CLEAR 10/25/2019 1403   LABSPEC 1.017 10/25/2019 1403   PHURINE 6.5 10/25/2019 1403   GLUCOSEU NEGATIVE 10/25/2019 1403   [place] 10/25/2019 1403   BILIRUBINUR negative 04/19/2019 1354   KETONESUR NEGATIVE 10/25/2019 [place] 10/25/2019 1403   UROBILINOGEN 0.2 04/19/2019 1354   UROBILINOGEN 1.0 05/21/2015 1050   NITRITE Negative 04/19/2019 1354   NITRITE NEGATIVE 12/29/2017 0200   LEUKOCYTESUR Negative 04/19/2019 1354    Lab Results  Component Value Date   BACTERIA NONE SEEN 10/25/2019    Pertinent Imaging:  No results found for this or any previous visit. No results found for this or any previous visit. No results found for this or any previous visit. No results found for this or any previous visit. No results found  for this or any previous visit. No results found for this or any previous visit. No results found for this or any previous visit. No results found for this or any previous visit.  Assessment & Plan:    1. Elevated PSA PSa today, if stable RTC 6 months with PSA  2. Benign prostatic hyperplasia with urinary  obstruction -continue finasteride -uroxatral 10mg  qhs  3. Nocturia -start uroxatral 10mg  qhs   No follow-ups on file.  Nicolette Bang, MD  [facility] [place]

## 2020-04-18 NOTE — Progress Notes (Signed)
Urological Symptom Review  Patient is experiencing the following symptoms: Frequent urination Hard to postpone urination Burning/pain with urination Get up at night to urinate Leakage of urine Stream starts and stops Weak stream   Review of Systems  Gastrointestinal (upper)  : Negative for upper GI symptoms  Gastrointestinal (lower) : Negative for lower GI symptoms  Constitutional : Negative for symptoms  Skin: Skin rash/lesion Itching  Eyes: Negative for eye symptoms  Ear/Nose/Throat : Negative for Ear/Nose/Throat symptoms  Hematologic/Lymphatic: Easy bruising  Cardiovascular : Negative for cardiovascular symptoms  Respiratory : Negative for respiratory symptoms  Endocrine: Negative for endocrine symptoms  Musculoskeletal: Back pain Joint pain  Neurological: Headaches Dizziness  Psychologic: Depression

## 2020-04-20 ENCOUNTER — Ambulatory Visit: Payer: Medicare Other

## 2020-04-25 ENCOUNTER — Other Ambulatory Visit: Payer: Self-pay | Admitting: Urology

## 2020-04-25 ENCOUNTER — Other Ambulatory Visit (HOSPITAL_COMMUNITY)
Admission: AD | Admit: 2020-04-25 | Discharge: 2020-04-25 | Disposition: A | Discharge status: Home / Self Care | Payer: Medicare Other | Source: Skilled Nursing Facility | Attending: Urology | Admitting: Urology

## 2020-04-25 ENCOUNTER — Other Ambulatory Visit: Payer: Self-pay

## 2020-04-25 DIAGNOSIS — R351 Nocturia: Secondary | ICD-10-CM | POA: Insufficient documentation

## 2020-04-26 ENCOUNTER — Encounter: Payer: Self-pay | Admitting: Urology

## 2020-04-26 LAB — PSA: PSA: 14.1 ng/mL — ABNORMAL HIGH (ref <=4.0)

## 2020-04-27 LAB — URINE CULTURE: Culture: <10000 — AB

## 2020-05-01 NOTE — Telephone Encounter (Signed)
Pt daughter called with concerns of the fathers PSA. Would like a nurse to give a call back.

## 2020-05-13 ENCOUNTER — Other Ambulatory Visit (INDEPENDENT_AMBULATORY_CARE_PROVIDER_SITE_OTHER): Payer: Self-pay | Admitting: Internal Medicine

## 2020-05-14 DIAGNOSIS — M545 Low back pain: Secondary | ICD-10-CM | POA: Diagnosis not present

## 2020-05-14 DIAGNOSIS — R2689 Other abnormalities of gait and mobility: Secondary | ICD-10-CM | POA: Diagnosis not present

## 2020-05-14 DIAGNOSIS — E1140 Type 2 diabetes mellitus with diabetic neuropathy, unspecified: Secondary | ICD-10-CM | POA: Diagnosis not present

## 2020-05-14 DIAGNOSIS — I951 Orthostatic hypotension: Secondary | ICD-10-CM | POA: Diagnosis not present

## 2020-05-21 ENCOUNTER — Other Ambulatory Visit: Payer: Self-pay

## 2020-05-21 DIAGNOSIS — R972 Elevated prostate specific antigen [PSA]: Secondary | ICD-10-CM

## 2020-05-21 MED ORDER — LEVOFLOXACIN 750 MG PO TABS: 750.0000 mg | ORAL_TABLET | Freq: Once | ORAL | 0 refills | Status: AC

## 2020-05-21 NOTE — Progress Notes (Signed)
Per Dr. Alyson Ingles- prostate biopsy ordered and pt instructions reviewed with pt. rx sent in as well.

## 2020-06-13 ENCOUNTER — Other Ambulatory Visit: Payer: Self-pay | Admitting: Urology

## 2020-06-13 ENCOUNTER — Ambulatory Visit (HOSPITAL_COMMUNITY)
Admission: RE | Admit: 2020-06-13 | Discharge: 2020-06-13 | Disposition: A | Discharge status: Home / Self Care | Payer: Medicare Other | Source: Ambulatory Visit | Attending: Urology | Admitting: Urology

## 2020-06-13 ENCOUNTER — Ambulatory Visit (HOSPITAL_BASED_OUTPATIENT_CLINIC_OR_DEPARTMENT_OTHER): Payer: Medicare Other | Admitting: Urology

## 2020-06-13 ENCOUNTER — Encounter (HOSPITAL_COMMUNITY): Payer: Self-pay

## 2020-06-13 ENCOUNTER — Other Ambulatory Visit: Payer: Self-pay

## 2020-06-13 DIAGNOSIS — R972 Elevated prostate specific antigen [PSA]: Secondary | ICD-10-CM

## 2020-06-13 MED ORDER — LIDOCAINE HCL (PF) 2 % IJ SOLN
INTRAMUSCULAR | Status: AC
  Administered 2020-06-13: 10 mL
  Filled 2020-06-13: qty 10

## 2020-06-13 MED ORDER — GENTAMICIN SULFATE 40 MG/ML IJ SOLN: 80.0000 mg | Freq: Once | INTRAMUSCULAR | Status: AC

## 2020-06-13 MED ORDER — GENTAMICIN SULFATE 40 MG/ML IJ SOLN
INTRAMUSCULAR | Status: AC
  Administered 2020-06-13: 80 mg via INTRAMUSCULAR
  Filled 2020-06-13: qty 2

## 2020-06-17 ENCOUNTER — Encounter: Payer: Self-pay | Admitting: Urology

## 2020-06-18 ENCOUNTER — Encounter: Payer: Self-pay | Admitting: Urology

## 2020-06-18 NOTE — Patient Instructions (Signed)

## 2020-06-18 NOTE — Progress Notes (Signed)
Prostate Biopsy Procedure   Informed consent was obtained after discussing risks/benefits of the procedure.  A time out was performed to ensure correct patient identity.  Pre-Procedure: - Last PSA Level:  Lab Results  Component Value Date   PSA 14.1 (H) 04/25/2020   - Gentamicin given prophylactically - Levaquin 500 mg administered PO -Transrectal Ultrasound performed revealing a 76.07 gm prostate -No significant hypoechoic or median lobe noted  Procedure: - Prostate block performed using 10 cc 1% lidocaine and biopsies taken from sextant areas, a total of 12 under ultrasound guidance.  Post-Procedure: - Patient tolerated the procedure well - He was counseled to seek immediate medical attention if experiences any severe pain, significant bleeding, or fevers - Return in one week to discuss biopsy results

## 2020-06-20 ENCOUNTER — Other Ambulatory Visit: Payer: Self-pay

## 2020-06-20 ENCOUNTER — Other Ambulatory Visit: Payer: Self-pay | Admitting: Urology

## 2020-06-20 ENCOUNTER — Encounter: Payer: Self-pay | Admitting: Urology

## 2020-06-20 ENCOUNTER — Ambulatory Visit (INDEPENDENT_AMBULATORY_CARE_PROVIDER_SITE_OTHER): Payer: Medicare Other | Admitting: Urology

## 2020-06-20 VITALS — BP 129/74 | HR 76 | Temp 97.7°F | Ht 74.0 in | Wt 195.0 lb

## 2020-06-20 DIAGNOSIS — C61 Malignant neoplasm of prostate: Secondary | ICD-10-CM | POA: Diagnosis not present

## 2020-06-20 NOTE — Progress Notes (Signed)
Urological Symptom Review  Patient is experiencing the following symptoms: Get up at night to urinate Weak urine stream  Review of Systems  Gastrointestinal (upper)  : Negative for upper GI symptoms  Gastrointestinal (lower) : Constipation  Constitutional : Negative for symptoms  Skin: Itching  Eyes: Negative for eye symptoms  Ear/Nose/Throat : Negative for Ear/Nose/Throat symptoms  Hematologic/Lymphatic: Negative for Hematologic/Lymphatic symptoms  Cardiovascular : Negative for cardiovascular symptoms  Respiratory : Negative for respiratory symptoms  Endocrine: Negative for endocrine symptoms  Musculoskeletal: Negative for musculoskeletal symptoms  Neurological: Negative for neurological symptoms  Psychologic: Negative for psychiatric symptoms

## 2020-06-20 NOTE — Patient Instructions (Signed)
Prostate Cancer  The prostate is a male gland that helps make semen. Prostate cancer is when abnormal cells grow in this gland. Follow these instructions at home:  Take over-the-counter and prescription medicines only as told by your doctor.  Eat a healthy diet.  Get plenty of sleep.  Ask your doctor for help to find a support group for men with prostate cancer.  Keep all follow-up visits as told by your doctor. This is important.  If you have to go to the hospital, let your cancer doctor (oncologist) know.  Touch, hold, hug, and caress your partner to continue to show sexual feelings. Contact a doctor if:  You have trouble peeing (urinating).  You have blood in your pee (urine).  You have pain in your hips, back, or chest. Get help right away if:  You have weakness in your legs.  You lose feeling (have numbness) in your legs.  You cannot control your pee or your poop (stool).  You have trouble breathing.  You have sudden pain in your chest.  You have chills or a fever. Summary  The prostate is a male gland that helps make semen. Prostate cancer is when abnormal cells grow in this gland.  Ask your doctor for help to find a support group for men with prostate cancer.  Contact a doctor if you have problems peeing or have any new pain that you did not have before. This information is not intended to replace advice given to you by your health care provider. Make sure you discuss any questions you have with your health care provider. Document Revised: 10/30/2017 Document Reviewed: 07/28/2016 Elsevier Patient Education  2020 [employer]  

## 2020-06-20 NOTE — Progress Notes (Signed)
06/20/2020 4:15 PM   Jared Bridges November 05, 1930 [id number]  Referring provider: Ailene Ards, NP [address] [name],  [place] [address]  Followup prostate biopsy  HPI: Mr Jared Bridges is a 86yo here for followup after prostate biopsy. Biopsy revealed Gleason 4+5=9, 5+4=9 in 12/12 cores 70-95% of core. PSA was 14.1 on finasteride.    PMH: Past Medical History:  Diagnosis Date  . Anemia, iron deficiency   . Chronic headaches    Migraines  . Chronic kidney disease    Resolved  . Depression   . Diverticulitis   . Essential hypertension   . GI bleed    Dr. Laural Bridges - 1998  . Hypercholesteremia   . Orthostatic hypotension   . Peptic ulcer disease   . Peripheral neuropathy   . Polymyalgia rheumatica ([place])   . Stroke Jared Bridges)    Residual trouble reading and writing  . Syncope   . Thyroid nodule   . TIA (transient ischemic attack) 12/2017  . Type 2 diabetes mellitus [facility])     Surgical History: Past Surgical History:  Procedure Laterality Date  . APPENDECTOMY     as child  . BACK SURGERY     2000 IDET SPINAL PROC  . CHOLECYSTECTOMY N/A 04/07/2016   Procedure: CHOLECYSTECTOMY;  Surgeon: Jared Signs, MD;  [facility]: [facility];  Service: General;  Laterality: N/A;  . [place]  . COLONOSCOPY N/A 01/07/2018   Procedure: COLONOSCOPY;  Surgeon: Jared Houston, MD;  [facility];  Service: Endoscopy;  Laterality: N/A;  1:00  . CRYOTHERAPY    . EYE SURGERY     right cataract with lens implant  . FEMUR SURGERY     RT LEG        1953  . HAND SURGERY     RIGHT  . HERNIA REPAIR     1-right inguinal, 3- left inguinal  . IRRIGATION AND DEBRIDEMENT KNEE Left 07/09/2015   Procedure: LEFT KNEE IRRIGATION AND DEBRIDEMENT WOUND CLOSURE;  Surgeon: Jared Arabian, MD;  [facility];  Service: Orthopedics;  Laterality: Left;  . JOINT REPLACEMENT  02/28/2016   lt knee revision  . KNEE DEBRIDEMENT     2016   LEFT  . MENISCUS REPAIR     LEFT  .  POLYPECTOMY  01/07/2018   Procedure: POLYPECTOMY;  Surgeon: Jared Houston, MD;  [facility];  Service: Endoscopy;;  colon   . SHOULDER OPEN ROTATOR CUFF REPAIR     RT SHOULDER  . SPINAL FUSION    . SPINAL FUSION    . TONSILLECTOMY    . TOTAL KNEE ARTHROPLASTY Left 05/28/2015   Procedure: LEFT TOTAL KNEE ARTHROPLASTY;  Surgeon: Jared Arabian, MD;  [facility];  Service: Orthopedics;  Laterality: Left;  . TOTAL KNEE REVISION Left 02/28/2016   Procedure: LEFT TOTAL KNEE REVISION;  Surgeon: Jared Koyanagi, MD;  Location: [place];  Service: Orthopedics;  Laterality: Left;  . TRIGGER FINGER RELEASE     RT HAND   1990S  . UVULECTOMY  2004   TURBINATE,TONSIL,ADENOID    Home Medications:  Allergies as of 06/20/2020      Reactions   Iodine Hives, Itching, Other (See Comments)   very allergic(per daughter), can pre-med with benadryl   Calan [verapamil] Other (See Comments)   Weakness   Cymbalta [duloxetine Hcl] Other (See Comments)   Makes patient have jerking motions.   Iodinated Diagnostic Agents  Hives, Itching, Other (See Comments)   Can pre-med with benadryl   Latex Other (See Comments)   Redness iritation   Lisinopril Other (See Comments)   Makes patient have jerking motions.   Neurontin [gabapentin] Other (See Comments)   Makes patient have jerking motions.   Zoloft [sertraline Hcl] Diarrhea, Other (See Comments)   Makes patient have jerking motions.   Valium [diazepam] Other (See Comments)   "Did not work"   Flomax [tamsulosin Hcl] Other (See Comments)   Dizziness   Glimepiride Other (See Comments)   "Did not work"   Melatonin Nausea Only   Tape Other (See Comments)   Skin irritation      Medication List       Accurate as of June 20, 2020  4:15 PM. If you have any questions, ask your nurse or doctor.        alfuzosin 10 MG 24 hr tablet Commonly known as: UROXATRAL Take 1 tablet (10 mg total) by mouth at bedtime.   atorvastatin 40 MG tablet Commonly  known as: LIPITOR Take 1 tablet (40 mg total) by mouth daily.   bismuth subsalicylate 621 HY/86VH suspension Commonly known as: PEPTO BISMOL Take 30 mLs by mouth every 6 (six) hours as needed for diarrhea or loose stools.   diclofenac sodium 1 % Gel Commonly known as: VOLTAREN Apply 2 g topically daily as needed (for pain).   escitalopram 20 MG tablet Commonly known as: LEXAPRO escitalopram 20 mg tablet  TAKE 1 TABLET BY MOUTH EVERY DAY   escitalopram 10 MG tablet Commonly known as: LEXAPRO Take 20 mg by mouth at bedtime.   finasteride 5 MG tablet Commonly known as: PROSCAR Take 1 tablet (5 mg total) by mouth daily.   fluocinonide cream 0.05 % Commonly known as: LIDEX Apply 1 application topically daily as needed (for irritation).   fluticasone 50 MCG/ACT nasal spray Commonly known as: FLONASE Place 2 sprays into both nostrils daily.   hydrocortisone 2.5 % cream Apply 1 application topically 2 (two) times daily as needed (for psoriasis).   midodrine 10 MG tablet Commonly known as: PROAMATINE Take 10 mg by mouth 3 (three) times daily.   Otezla 10 & 20 & 30 MG Tbpk Generic drug: Apremilast Take 10-30 mg by mouth See admin instructions. Take 10 mg by mouth daily on day 1, take 10 mg by mouth twice daily on day 2, take 10 mg by mouth in the morning and take 20 mg by mouth in the evening on day 3, take 20 mg by mouth twice daily on day 4, take 20 mg by mouth in the morning and take 30 mg by mouth in the evening on day 5, take 30 mg by mouth twice daily on day 6, then continue 30 mg by mouth twice daily   pantoprazole 20 MG tablet Commonly known as: PROTONIX TAKE 1 TABLET BY MOUTH EVERY DAY BEFORE BREAKFAST   pregabalin 75 MG capsule Commonly known as: LYRICA pregabalin 75 mg capsule  TAKE 1 CAPSULE BY MOUTH TWICE DAILY   pregabalin 50 MG capsule Commonly known as: LYRICA TAKE ONE CAPSULE BY MOUTH EVERY DAY   topiramate 25 MG tablet Commonly known as: TOPAMAX Take  50 mg by mouth 2 (two) times daily.   topiramate 50 MG tablet Commonly known as: TOPAMAX topiramate 50 mg tablet  TAKE 1 TABLET BY MOUTH TWICE DAILY   venlafaxine XR 37.5 MG 24 hr capsule Commonly known as: EFFEXOR-XR TAKE ONE CAPSULE BY MOUTH EVERY MORNING  vitamin B-12 500 MCG tablet Commonly known as: CYANOCOBALAMIN Take 1,000 mcg by mouth daily.   Vitamin D3 125 MCG (5000 UT) Caps Take 5,000 Units by mouth at bedtime.   zolpidem 5 MG tablet Commonly known as: AMBIEN Take 5-10 mg by mouth at bedtime.       Allergies:  Allergies  Allergen Reactions  . Iodine Hives, Itching and Other (See Comments)    very allergic(per daughter), can pre-med with benadryl  . Calan [Verapamil] Other (See Comments)    Weakness  . Cymbalta [Duloxetine Hcl] Other (See Comments)    Makes patient have jerking motions.  . Iodinated Diagnostic Agents Hives, Itching and Other (See Comments)    Can pre-med with benadryl  . Latex Other (See Comments)    Redness iritation  . Lisinopril Other (See Comments)    Makes patient have jerking motions.  . Neurontin [Gabapentin] Other (See Comments)    Makes patient have jerking motions.  [name] Kitchen Zoloft [Sertraline Hcl] Diarrhea and Other (See Comments)    Makes patient have jerking motions.  . Valium [Diazepam] Other (See Comments)    "Did not work"  . Flomax [Tamsulosin Hcl] Other (See Comments)    Dizziness  . Glimepiride Other (See Comments)    "Did not work"  . Melatonin Nausea Only  . Tape Other (See Comments)    Skin irritation    Family History: Family History  Problem Relation Age of Onset  . Colon cancer Sister   . Hypertension Other   . Depression Brother   . Depression Daughter     Social History:  reports that he quit smoking about 33 years ago. His smoking use included cigarettes. He started smoking about 61 years ago. He has a 1.80 pack-year smoking history. He has never used smokeless tobacco. He reports that he does not drink  alcohol and does not use drugs.  ROS: All other review of systems were reviewed and are negative except what is noted above in HPI  Physical Exam: BP 129/74   Pulse 76   Temp 97.7 F (36.5 C)   Ht 6\' 2"  (1.88 m)   Wt 195 lb (88.5 kg)   BMI 25.04 kg/m   Constitutional:  Alert and oriented, No acute distress. HEENT: [place] AT, moist mucus membranes.  Trachea midline, no masses. Cardiovascular: No clubbing, cyanosis, or edema. Respiratory: Normal respiratory effort, no increased work of breathing. GI: Abdomen is soft, nontender, nondistended, no abdominal masses GU: No CVA tenderness.  Lymph: No cervical or inguinal lymphadenopathy. Skin: No rashes, bruises or suspicious lesions. Neurologic: Grossly intact, no focal deficits, moving all 4 extremities. Psychiatric: Normal mood and affect.  Laboratory Data: Lab Results  Component Value Date   WBC 8.5 12/28/2017   HGB 14.3 12/28/2017   HCT 42.0 12/28/2017   MCV 99.1 12/28/2017   PLT 210 12/28/2017    Lab Results  Component Value Date   CREATININE 1.19 (H) 03/06/2020    Lab Results  Component Value Date   PSA 14.1 (H) 04/25/2020    No results found for: TESTOSTERONE  Lab Results  Component Value Date   HGBA1C 6.8 (H) 03/06/2020    Urinalysis    Component Value Date/Time   COLORURINE YELLOW 10/25/2019 1403   APPEARANCEUR CLEAR 10/25/2019 1403   LABSPEC 1.017 10/25/2019 1403   [place] 6.5 10/25/2019 1403   GLUCOSEU NEGATIVE 10/25/2019 1403   HGBUR NEGATIVE 10/25/2019 1403   BILIRUBINUR negative 04/19/2019 [place] 10/25/2019 1403  PROTEINUR NEGATIVE 10/25/2019 1403   UROBILINOGEN 0.2 04/19/2019 1354   UROBILINOGEN 1.0 05/21/2015 1050   NITRITE Negative 04/19/2019 1354   NITRITE NEGATIVE 12/29/2017 0200   LEUKOCYTESUR Negative 04/19/2019 1354    Lab Results  Component Value Date   BACTERIA NONE SEEN 10/25/2019    Pertinent Imaging:  No results found for this or any previous  visit.  No results found for this or any previous visit.  No results found for this or any previous visit.  No results found for this or any previous visit.  No results found for this or any previous visit.  No results found for this or any previous visit.  No results found for this or any previous visit.  No results found for this or any previous visit.   Assessment & Plan:    1. Prostate cancer [facility]) I discussed the natural history of high risk prostate cancer with the patient and the various treatment options including active surveillance, RALP, IMRT, brachytherapy, cryotherapy, HIFU and ADT.  We will obtain a CT and Bone scan  - Basic metabolic panel - CT Abdomen Pelvis W Wo Contrast; Future - NM Bone Scan Whole Body; Future    Nicolette Bang, MD  [facility]

## 2020-06-21 LAB — BASIC METABOLIC PANEL WITH GFR
BUN/Creatinine Ratio: 12 (calc) (ref 6–22)
BUN: 18 mg/dL (ref 7–25)
CO2: 26 mmol/L (ref 20–32)
Calcium: 9.3 mg/dL (ref 8.6–10.3)
Chloride: 105 mmol/L (ref 98–110)
Creat: 1.5 mg/dL — ABNORMAL HIGH (ref 0.70–1.11)
GFR, Est African American: 47 mL/min/{1.73_m2} — ABNORMAL LOW (ref >=60)
GFR, Est Non African American: 41 mL/min/{1.73_m2} — ABNORMAL LOW (ref >=60)
Glucose, Bld: 168 mg/dL — ABNORMAL HIGH (ref 65–99)
Potassium: 4.6 mmol/L (ref 3.5–5.3)
Sodium: 139 mmol/L (ref 135–146)

## 2020-06-22 ENCOUNTER — Telehealth: Payer: Self-pay | Admitting: Urology

## 2020-06-22 NOTE — Telephone Encounter (Signed)
Pt. called with no answer message left to call office. Letter sent via my chart.

## 2020-06-22 NOTE — Telephone Encounter (Signed)
Pt allergic to iodine  Per daughter and wanted to make sure we know since he has having a CT.

## 2020-06-22 NOTE — Telephone Encounter (Signed)
-----   Message from Cleon Gustin, MD sent at 06/22/2020  8:33 AM EDT ----- Patient needs to hydrate for his CT scan due to elevated creatinine ----- Message ----- From: Iris Pert, LPN Sent: 0/30/0923   2:48 PM EDT To: Cleon Gustin, MD  Please review

## 2020-06-25 ENCOUNTER — Other Ambulatory Visit: Payer: Self-pay

## 2020-06-25 DIAGNOSIS — C61 Malignant neoplasm of prostate: Secondary | ICD-10-CM

## 2020-06-25 NOTE — Telephone Encounter (Signed)
I spoke with Dr. Alyson Ingles.  CT with contrast will be cancelled d/t allergy. New order placed for without contrast. Talked with Manuela Schwartz at scheduling. Order was cancelled and new order placed for same date/time. Per Olivia Mackie no pre cert needed.   Pt aware.

## 2020-06-27 ENCOUNTER — Other Ambulatory Visit: Payer: Self-pay

## 2020-06-27 ENCOUNTER — Encounter (HOSPITAL_COMMUNITY)
Admission: RE | Admit: 2020-06-27 | Discharge: 2020-06-27 | Disposition: A | Discharge status: Home / Self Care | Payer: Medicare Other | Source: Ambulatory Visit | Attending: Urology | Admitting: Urology

## 2020-06-27 DIAGNOSIS — C61 Malignant neoplasm of prostate: Secondary | ICD-10-CM | POA: Insufficient documentation

## 2020-06-27 MED ORDER — TECHNETIUM TC 99M MEDRONATE IV KIT
20.0000 | PACK | Freq: Once | INTRAVENOUS | Status: AC | PRN
  Administered 2020-06-27: 18.8 via INTRAVENOUS

## 2020-06-29 ENCOUNTER — Telehealth: Payer: Self-pay

## 2020-06-29 NOTE — Telephone Encounter (Signed)
Pts drt made aware of results.

## 2020-06-29 NOTE — Telephone Encounter (Signed)
-----   Message from Cleon Gustin, MD sent at 06/28/2020 12:27 PM EDT ----- He needs to see me to start ADT. Bone scan shows metastatic disease ----- Message ----- From: Valentina Lucks, LPN Sent: 03/27/6700   8:24 AM EDT To: Cleon Gustin, MD  Pls. Review.

## 2020-06-29 NOTE — Progress Notes (Signed)
Pt. Has appt. For 8/9.

## 2020-06-29 NOTE — Telephone Encounter (Signed)
-----   Message from Cleon Gustin, MD sent at 06/28/2020 12:27 PM EDT ----- He needs to see me to start ADT. Bone scan shows metastatic disease ----- Message ----- From: Valentina Lucks, LPN Sent: 01/11/1734   8:24 AM EDT To: Cleon Gustin, MD  Pls. Review.

## 2020-07-01 ENCOUNTER — Encounter (INDEPENDENT_AMBULATORY_CARE_PROVIDER_SITE_OTHER): Payer: Self-pay | Admitting: Nurse Practitioner

## 2020-07-02 ENCOUNTER — Other Ambulatory Visit: Payer: Self-pay

## 2020-07-02 ENCOUNTER — Ambulatory Visit (HOSPITAL_COMMUNITY): Payer: Medicare Other

## 2020-07-02 ENCOUNTER — Ambulatory Visit (HOSPITAL_COMMUNITY)
Admission: RE | Admit: 2020-07-02 | Discharge: 2020-07-02 | Disposition: A | Discharge status: Home / Self Care | Payer: Medicare Other | Source: Ambulatory Visit | Attending: Urology | Admitting: Urology

## 2020-07-02 ENCOUNTER — Telehealth (INDEPENDENT_AMBULATORY_CARE_PROVIDER_SITE_OTHER): Payer: Self-pay

## 2020-07-02 DIAGNOSIS — I712 Thoracic aortic aneurysm, without rupture: Secondary | ICD-10-CM | POA: Diagnosis not present

## 2020-07-02 DIAGNOSIS — C61 Malignant neoplasm of prostate: Secondary | ICD-10-CM | POA: Diagnosis present

## 2020-07-02 DIAGNOSIS — I714 Abdominal aortic aneurysm, without rupture: Secondary | ICD-10-CM | POA: Diagnosis not present

## 2020-07-02 DIAGNOSIS — I700 Atherosclerosis of aorta: Secondary | ICD-10-CM | POA: Diagnosis not present

## 2020-07-02 NOTE — Telephone Encounter (Signed)
Called back to f/u with Dtr [name] request to discuss about for Fathers condition. I was just a "sound board" to help her if needed. Pt was relieved to know she could talk and just have a person to bounce ideas and concerns on how to setup new appointments and if needed a referral.

## 2020-07-04 ENCOUNTER — Encounter: Payer: Self-pay | Admitting: Urology

## 2020-07-09 ENCOUNTER — Other Ambulatory Visit (INDEPENDENT_AMBULATORY_CARE_PROVIDER_SITE_OTHER): Payer: Self-pay | Admitting: Gastroenterology

## 2020-07-09 ENCOUNTER — Other Ambulatory Visit (INDEPENDENT_AMBULATORY_CARE_PROVIDER_SITE_OTHER): Payer: Self-pay | Admitting: Internal Medicine

## 2020-07-09 ENCOUNTER — Ambulatory Visit: Payer: Medicare Other | Admitting: Urology

## 2020-07-11 ENCOUNTER — Ambulatory Visit (INDEPENDENT_AMBULATORY_CARE_PROVIDER_SITE_OTHER): Payer: Medicare Other | Admitting: Internal Medicine

## 2020-07-11 ENCOUNTER — Other Ambulatory Visit: Payer: Self-pay

## 2020-07-11 ENCOUNTER — Encounter (INDEPENDENT_AMBULATORY_CARE_PROVIDER_SITE_OTHER): Payer: Self-pay | Admitting: Internal Medicine

## 2020-07-11 VITALS — BP 120/75 | HR 64 | Temp 98.1°F | Ht 74.0 in | Wt 205.0 lb

## 2020-07-11 DIAGNOSIS — C61 Malignant neoplasm of prostate: Secondary | ICD-10-CM | POA: Diagnosis not present

## 2020-07-11 NOTE — Progress Notes (Signed)
Metrics: Intervention Frequency ACO  Documented Smoking Status Yearly  Screened one or more times in 24 months  Cessation Counseling or  Active cessation medication Past 24 months  Past 24 months   Guideline developer: UpToDate (See UpToDate for funding source) Date Released: 2014       Wellness Office Visit  Subjective:  Patient ID: Jared Bridges, male    DOB: July 29, 1930  Age: 86 y.o. MRN: [id number]  CC: This man comes in with most recent diagnosis of what appears to be metastatic prostate cancer. HPI  We had already known about an elevated PSA 18 months ago and I referred him to urology but somehow there was not follow-up and eventually he did see the urologist who wanted to continue to monitor.  He has had a prostate biopsy which is positive for adenocarcinoma but the bone scan and CT abdominal scan does imply metastatic spread to bone now.  He is due to see [facility] in the next couple of weeks. He feels reasonably well. He is diabetic but not taking any medication for it. Past Medical History:  Diagnosis Date  . Anemia, iron deficiency   . Chronic headaches    Migraines  . Chronic kidney disease    Resolved  . Depression   . Diverticulitis   . Essential hypertension   . GI bleed    Dr. Laural Golden - 1998  . Hypercholesteremia   . Orthostatic hypotension   . Peptic ulcer disease   . Peripheral neuropathy   . Polymyalgia rheumatica ([place])   . Stroke [facility])    Residual trouble reading and writing  . Syncope   . Thyroid nodule   . TIA (transient ischemic attack) 12/2017  . Type 2 diabetes mellitus ([place])    Past Surgical History:  Procedure Laterality Date  . APPENDECTOMY     as child  . BACK SURGERY     2000 IDET SPINAL PROC  . CHOLECYSTECTOMY N/A 04/07/2016   Procedure: CHOLECYSTECTOMY;  Surgeon: Aviva Signs, MD;  [facility];  Service: General;  Laterality: N/A;  . [place]  . COLONOSCOPY N/A 01/07/2018   Procedure:  COLONOSCOPY;  Surgeon: Rogene Houston, MD;  [facility];  Service: Endoscopy;  Laterality: N/A;  1:00  . CRYOTHERAPY    . EYE SURGERY     right cataract with lens implant  . FEMUR SURGERY     RT LEG        1953  . HAND SURGERY     RIGHT  . HERNIA REPAIR     1-right inguinal, 3- left inguinal  . IRRIGATION AND DEBRIDEMENT KNEE Left 07/09/2015   Procedure: LEFT KNEE IRRIGATION AND DEBRIDEMENT WOUND CLOSURE;  Surgeon: Gaynelle Arabian, MD;  [facility];  Service: Orthopedics;  Laterality: Left;  . JOINT REPLACEMENT  02/28/2016   lt knee revision  . KNEE DEBRIDEMENT     2016   LEFT  . MENISCUS REPAIR     LEFT  . POLYPECTOMY  01/07/2018   Procedure: POLYPECTOMY;  Surgeon: Rogene Houston, MD;  [facility];  Service: Endoscopy;;  colon   . SHOULDER OPEN ROTATOR CUFF REPAIR     RT SHOULDER  . SPINAL FUSION    . SPINAL FUSION    . TONSILLECTOMY    . TOTAL KNEE ARTHROPLASTY Left 05/28/2015   Procedure: LEFT TOTAL KNEE ARTHROPLASTY;  Surgeon: Gaynelle Arabian, MD;  [facility];  Service: Orthopedics;  Laterality: Left;  . TOTAL KNEE REVISION Left 02/28/2016   Procedure: LEFT TOTAL KNEE REVISION;  Surgeon: Leandrew Koyanagi, MD;  Location: [place];  Service: Orthopedics;  Laterality: Left;  . TRIGGER FINGER RELEASE     RT HAND   1990S  . UVULECTOMY  2004   TURBINATE,TONSIL,ADENOID     Family History  Problem Relation Age of Onset  . Colon cancer Sister   . Hypertension Other   . Depression Brother   . 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   Social History   Tobacco Use  . Smoking status: Former Smoker    Packs/day: 0.10    Years: 18.00    Pack years: 1.80    Types: Cigarettes    Start date: 12/18/1958    Quit date: 12/01/1986    Years since quitting: 33.6  . Smokeless tobacco: Never Used  Substance Use Topics  . Alcohol use: No    Alcohol/week: 0.0 standard drinks     Current Meds  Medication Sig  . alfuzosin (UROXATRAL) 10 MG 24 hr tablet Take 1 tablet (10 mg total) by mouth at bedtime.  [name] Kitchen Apremilast (OTEZLA) 10 & 20 & 30 MG TBPK Take 10-30 mg by mouth See admin instructions. Take 10 mg by mouth daily on day 1, take 10 mg by mouth twice daily on day 2, take 10 mg by mouth in the morning and take 20 mg by mouth in the evening on day 3, take 20 mg by mouth twice daily on day 4, take 20 mg by mouth in the morning and take 30 mg by mouth in the evening on day 5, take 30 mg by mouth twice daily on day 6, then continue 30 mg by mouth twice daily  . atorvastatin (LIPITOR) 40 MG tablet Take 1 tablet (40 mg total) by mouth daily.  [name] Kitchen bismuth subsalicylate (PEPTO BISMOL) 262 MG/15ML suspension Take 30 mLs by mouth every 6 (six) hours as needed for diarrhea or loose stools.  . Cholecalciferol (VITAMIN D3) 5000 units CAPS Take 5,000 Units by mouth at bedtime.  . diclofenac sodium (VOLTAREN) 1 % GEL Apply 2 g topically daily as needed (for pain).  [name] Kitchen escitalopram (LEXAPRO) 10 MG tablet Take 20 mg by mouth at bedtime.   [name] Kitchen escitalopram (LEXAPRO) 20 MG tablet escitalopram 20 mg tablet  TAKE 1 TABLET BY MOUTH EVERY DAY  . finasteride (PROSCAR) 5 MG tablet Take 1 tablet (5 mg total) by mouth daily.  . fluocinonide cream (LIDEX) 5.95 % Apply 1 application topically daily as needed (for irritation).   . fluticasone (FLONASE) 50 MCG/ACT nasal spray Place 2 sprays into both nostrils daily.  . hydrocortisone 2.5 % cream Apply 1 application topically 2 (two) times daily as needed (for psoriasis).  . midodrine (PROAMATINE) 10 MG tablet Take 10 mg by mouth 3 (three) times daily.   . pantoprazole (PROTONIX) 20 MG tablet TAKE 1 TABLET BY MOUTH EVERY DAY BEFORE BREAKFAST  . pregabalin (LYRICA) 50 MG capsule TAKE ONE CAPSULE BY MOUTH EVERY DAY  . pregabalin (LYRICA) 75 MG capsule pregabalin 75 mg capsule  TAKE 1 CAPSULE BY MOUTH TWICE DAILY  . topiramate (TOPAMAX) 25 MG tablet Take 50  mg by mouth 2 (two) times daily.   [name] Kitchen topiramate (TOPAMAX) 50 MG tablet topiramate 50 mg tablet  TAKE 1 TABLET BY MOUTH TWICE DAILY  . venlafaxine XR (EFFEXOR-XR) 37.5  MG 24 hr capsule TAKE ONE CAPSULE BY MOUTH EVERY MORNING  . vitamin B-12 (CYANOCOBALAMIN) 500 MCG tablet Take 1,000 mcg by mouth daily.  [name] Kitchen zolpidem (AMBIEN) 5 MG tablet Take 5-10 mg by mouth at bedtime.       Depression screen [facility] 2/9 03/06/2020 12/07/2019  Decreased Interest 0 0  Down, Depressed, Hopeless 0 0  PHQ - 2 Score 0 0     Objective:   Today's Vitals: BP 120/75 (BP Location: Right Leg, Patient Position: Sitting, Cuff Size: Normal)   Pulse 64   Temp 98.1 F (36.7 C)   Ht 6\' 2"  (1.88 m)   Wt 205 lb (93 kg)   SpO2 94%   BMI 26.32 kg/m  Vitals with BMI 07/11/2020 06/20/2020 06/13/2020  Height 6\' 2"  6\' 2"  -  Weight 205 lbs 195 lbs -  BMI 73.22 02.54 -  Systolic 270 623 762  Diastolic 75 74 73  Pulse 64 76 60  Some encounter information is confidential and restricted. Go to Review Flowsheets activity to see all data.     Physical Exam  He looks systemically well and his weight is stable.  Blood pressure is excellent.     Assessment   1. Prostate cancer ([place])       Tests ordered No orders of the defined types were placed in this encounter.    Plan: 1. Today we just discussed questions he had around his metastatic prostate cancer presumed.  I told him that, as his physician, I would recommend that what ever treatment is recommended, I would ask many questions and also I would hesitate on treatment that would affect the quality of his life.  He understands and appreciates. 2. Follow-up in about 4 months time.  I spent 30 minutes with this patient today answering any questions he had regarding his prostate cancer.   No orders of the defined types were placed in this encounter.   Doree Albee, MD

## 2020-07-12 ENCOUNTER — Other Ambulatory Visit (HOSPITAL_COMMUNITY): Payer: Medicare Other

## 2020-07-18 ENCOUNTER — Telehealth (INDEPENDENT_AMBULATORY_CARE_PROVIDER_SITE_OTHER): Payer: Self-pay | Admitting: Internal Medicine

## 2020-07-18 ENCOUNTER — Other Ambulatory Visit (INDEPENDENT_AMBULATORY_CARE_PROVIDER_SITE_OTHER): Payer: Self-pay | Admitting: Internal Medicine

## 2020-07-18 NOTE — Telephone Encounter (Signed)
Daughter [name] left voice mail message stated patient needs refill on Protonix - stated the pharmacy stated it was denied - please advise [phone]

## 2020-07-18 NOTE — Telephone Encounter (Signed)
Will refill medication for one month, but needs follow up appointment with any provider in order to receive any more refills (was advised about this 5 months ago).  Thanks,  [name], MD Gastroenterology and Hepatology [facility]

## 2020-07-19 NOTE — Telephone Encounter (Signed)
Attempted to reach patient and or his daughter. Tried to leave a message and not sure if it recorded. Please let them know that the patient's pantoprazole is ready and may be picked up at [facility].

## 2020-07-20 NOTE — Telephone Encounter (Signed)
May I ask that you try and call to let patient and his daughter know.

## 2020-07-23 DIAGNOSIS — C7951 Secondary malignant neoplasm of bone: Secondary | ICD-10-CM | POA: Diagnosis not present

## 2020-07-30 DIAGNOSIS — C7951 Secondary malignant neoplasm of bone: Secondary | ICD-10-CM | POA: Diagnosis not present

## 2020-08-07 DIAGNOSIS — R2689 Other abnormalities of gait and mobility: Secondary | ICD-10-CM | POA: Diagnosis not present

## 2020-08-07 DIAGNOSIS — G43711 Chronic migraine without aura, intractable, with status migrainosus: Secondary | ICD-10-CM | POA: Diagnosis not present

## 2020-08-07 DIAGNOSIS — E538 Deficiency of other specified B group vitamins: Secondary | ICD-10-CM | POA: Diagnosis not present

## 2020-08-07 DIAGNOSIS — I951 Orthostatic hypotension: Secondary | ICD-10-CM | POA: Diagnosis not present

## 2020-08-07 DIAGNOSIS — E1140 Type 2 diabetes mellitus with diabetic neuropathy, unspecified: Secondary | ICD-10-CM | POA: Diagnosis not present

## 2020-08-13 ENCOUNTER — Other Ambulatory Visit (INDEPENDENT_AMBULATORY_CARE_PROVIDER_SITE_OTHER): Payer: Self-pay | Admitting: Gastroenterology

## 2020-09-03 DIAGNOSIS — C7951 Secondary malignant neoplasm of bone: Secondary | ICD-10-CM | POA: Diagnosis not present

## 2020-09-03 DIAGNOSIS — Z23 Encounter for immunization: Secondary | ICD-10-CM | POA: Diagnosis not present

## 2020-09-03 DIAGNOSIS — Z87891 Personal history of nicotine dependence: Secondary | ICD-10-CM | POA: Diagnosis not present

## 2020-09-04 ENCOUNTER — Encounter (HOSPITAL_COMMUNITY): Payer: Self-pay | Admitting: Emergency Medicine

## 2020-09-04 ENCOUNTER — Emergency Department (HOSPITAL_COMMUNITY)
Admission: EM | Admit: 2020-09-04 | Discharge: 2020-09-04 | Disposition: A | Discharge status: Home / Self Care | Payer: Medicare Other | Attending: Emergency Medicine | Admitting: Emergency Medicine

## 2020-09-04 ENCOUNTER — Other Ambulatory Visit: Payer: Self-pay

## 2020-09-04 ENCOUNTER — Emergency Department (HOSPITAL_COMMUNITY): Payer: Medicare Other

## 2020-09-04 DIAGNOSIS — R42 Dizziness and giddiness: Secondary | ICD-10-CM | POA: Diagnosis present

## 2020-09-04 DIAGNOSIS — R0689 Other abnormalities of breathing: Secondary | ICD-10-CM | POA: Diagnosis not present

## 2020-09-04 DIAGNOSIS — R29898 Other symptoms and signs involving the musculoskeletal system: Secondary | ICD-10-CM | POA: Diagnosis not present

## 2020-09-04 DIAGNOSIS — E119 Type 2 diabetes mellitus without complications: Secondary | ICD-10-CM | POA: Insufficient documentation

## 2020-09-04 DIAGNOSIS — R531 Weakness: Secondary | ICD-10-CM | POA: Diagnosis not present

## 2020-09-04 DIAGNOSIS — Z87891 Personal history of nicotine dependence: Secondary | ICD-10-CM | POA: Diagnosis not present

## 2020-09-04 DIAGNOSIS — Z743 Need for continuous supervision: Secondary | ICD-10-CM | POA: Diagnosis not present

## 2020-09-04 DIAGNOSIS — Z91040 Latex allergy status: Secondary | ICD-10-CM | POA: Diagnosis not present

## 2020-09-04 DIAGNOSIS — Z96652 Presence of left artificial knee joint: Secondary | ICD-10-CM | POA: Diagnosis not present

## 2020-09-04 DIAGNOSIS — Z8546 Personal history of malignant neoplasm of prostate: Secondary | ICD-10-CM | POA: Diagnosis not present

## 2020-09-04 DIAGNOSIS — W19XXXA Unspecified fall, initial encounter: Secondary | ICD-10-CM | POA: Insufficient documentation

## 2020-09-04 DIAGNOSIS — R6889 Other general symptoms and signs: Secondary | ICD-10-CM | POA: Diagnosis not present

## 2020-09-04 DIAGNOSIS — I10 Essential (primary) hypertension: Secondary | ICD-10-CM | POA: Diagnosis not present

## 2020-09-04 DIAGNOSIS — J90 Pleural effusion, not elsewhere classified: Secondary | ICD-10-CM | POA: Diagnosis not present

## 2020-09-04 LAB — CBC WITH DIFFERENTIAL/PLATELET
Abs Immature Granulocytes: 0.12 10*3/uL — ABNORMAL HIGH (ref 0.00–0.07)
Basophils Absolute: 0.1 10*3/uL (ref 0.0–0.1)
Basophils Relative: 0 %
Eosinophils Absolute: 0.1 10*3/uL (ref 0.0–0.5)
Eosinophils Relative: 0 %
HCT: 38.5 % — ABNORMAL LOW (ref 39.0–52.0)
Hemoglobin: 12.6 g/dL — ABNORMAL LOW (ref 13.0–17.0)
Immature Granulocytes: 1 %
Lymphocytes Relative: 3 %
Lymphs Abs: 0.6 10*3/uL — ABNORMAL LOW (ref 0.7–4.0)
MCH: 31.3 pg (ref 26.0–34.0)
MCHC: 32.7 g/dL (ref 30.0–36.0)
MCV: 95.8 fL (ref 80.0–100.0)
Monocytes Absolute: 1.5 10*3/uL — ABNORMAL HIGH (ref 0.1–1.0)
Monocytes Relative: 8 %
Neutro Abs: 16.3 10*3/uL — ABNORMAL HIGH (ref 1.7–7.7)
Neutrophils Relative %: 88 %
Platelets: 167 10*3/uL (ref 150–400)
RBC: 4.02 MIL/uL — ABNORMAL LOW (ref 4.22–5.81)
RDW: 13.2 % (ref 11.5–15.5)
WBC: 18.6 10*3/uL — ABNORMAL HIGH (ref 4.0–10.5)
nRBC: 0 % (ref 0.0–0.2)

## 2020-09-04 LAB — URINALYSIS, ROUTINE W REFLEX MICROSCOPIC
Bilirubin Urine: NEGATIVE
Glucose, UA: NEGATIVE mg/dL
Hgb urine dipstick: NEGATIVE
Ketones, ur: NEGATIVE mg/dL
Leukocytes,Ua: NEGATIVE
Nitrite: NEGATIVE
Protein, ur: NEGATIVE mg/dL
Specific Gravity, Urine: 1.016 (ref 1.005–1.030)
pH: 7 (ref 5.0–8.0)

## 2020-09-04 LAB — COMPREHENSIVE METABOLIC PANEL
ALT: 18 U/L (ref 0–44)
AST: 19 U/L (ref 15–41)
Albumin: 3.4 g/dL — ABNORMAL LOW (ref 3.5–5.0)
Alkaline Phosphatase: 72 U/L (ref 38–126)
Anion gap: 7 (ref 5–15)
BUN: 23 mg/dL (ref 8–23)
CO2: 23 mmol/L (ref 22–32)
Calcium: 8.9 mg/dL (ref 8.9–10.3)
Chloride: 105 mmol/L (ref 98–111)
Creatinine, Ser: 1.49 mg/dL — ABNORMAL HIGH (ref 0.61–1.24)
GFR calc non Af Amer: 41 mL/min — ABNORMAL LOW (ref >60)
Glucose, Bld: 207 mg/dL — ABNORMAL HIGH (ref 70–99)
Potassium: 4.3 mmol/L (ref 3.5–5.1)
Sodium: 135 mmol/L (ref 135–145)
Total Bilirubin: 1.1 mg/dL (ref 0.3–1.2)
Total Protein: 6.1 g/dL — ABNORMAL LOW (ref 6.5–8.1)

## 2020-09-04 LAB — CBG MONITORING, ED: Glucose-Capillary: 223 mg/dL — ABNORMAL HIGH (ref 70–99)

## 2020-09-04 LAB — MAGNESIUM: Magnesium: 2 mg/dL (ref 1.7–2.4)

## 2020-09-04 MED ORDER — SODIUM CHLORIDE 0.9 % IV SOLN: INTRAVENOUS | Status: DC

## 2020-09-04 NOTE — ED Triage Notes (Signed)
Pt went into his bathroom, got weak and fell. Denies hitting his head. States "I didn't know where I was"

## 2020-09-04 NOTE — Discharge Instructions (Addendum)
As discussed, your evaluation today has been largely reassuring.  But, it is important that you monitor your condition carefully, and do not hesitate to return to the ED if you develop new, or concerning changes in your condition. ? ?Otherwise, please follow-up with your physician for appropriate ongoing care. ? ?

## 2020-09-04 NOTE — ED Provider Notes (Signed)
[facility] EMERGENCY DEPARTMENT Provider Note   CSN: [id number] Arrival date & time: 09/04/20  0847     History Chief Complaint  Patient presents with  . Weakness  . Fall    Jared Bridges is a 86 y.o. male.  HPI    Patient presents after an episode of lightheadedness. Patient has multiple medical issues including prostate cancer, and orthostatic hypotension. He takes midodrine for the latter, and received Lupron yesterday for the former. Today, after going to the bathroom, he felt weak, lowered to the floor, did not truly fall, had no head trauma. Symptoms lasted for about 40 minutes.  He notes that he had lightheadedness throughout this, with sensation of not knowing where he was. Symptoms resolved prior to ED arrival, and currently patient denies pain, lightheadedness, fatigue, focal weakness or any other complaints.  Past Medical History:  Diagnosis Date  . Anemia, iron deficiency   . Chronic headaches    Migraines  . Chronic kidney disease    Resolved  . Depression   . Diverticulitis   . Essential hypertension   . GI bleed    Dr. Laural Golden - 1998  . Hypercholesteremia   . Orthostatic hypotension   . Peptic ulcer disease   . Peripheral neuropathy   . Polymyalgia rheumatica ([place])   . Stroke [facility])    Residual trouble reading and writing  . Syncope   . Thyroid nodule   . TIA (transient ischemic attack) 12/2017  . Type 2 diabetes mellitus [facility])     Patient Active Problem List   Diagnosis Date Noted  . Prostate cancer ([place]) 06/20/2020  . Elevated PSA 04/18/2020  . Nocturia 04/18/2020  . Medicare annual wellness visit, subsequent 12/07/2019  . Depressed mood 12/07/2019  . Vitamin D deficiency 10/25/2019  . Memory loss 10/25/2019  . Fall 10/25/2019  . Migraine headaches 12/29/2017  . TIA (transient ischemic attack) 12/28/2017  . Rectal bleeding 11/26/2017  . Acute cholecystitis 04/05/2016  . 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 Total  knee replacement status 02/28/2016  . CVA (cerebral infarction) 12/19/2015  . Acute CVA (cerebrovascular accident) ([place]) 12/19/2015  . Carotid stenosis 12/19/2015  . Postoperative wound dehiscence 07/09/2015  . Type 2 diabetes mellitus, uncontrolled ([place]) 06/29/2015  . Cellulitis of knee, left 06/29/2015  . Wound infection after surgery 06/29/2015  . OA (osteoarthritis) of knee 05/28/2015  . BRBPR (bright red blood per rectum) 11/21/2014  . Orthostatic hypotension 11/21/2014  . Dizziness 11/16/2014  . Polymyalgia rheumatica ([place]) 09/12/2014  . Anemia, iron deficiency 08/16/2014  . DJD (degenerative joint disease) 08/16/2014  . Essential hypertension   . Type 2 diabetes mellitus ([place])   . Cataract, nuclear 04/11/2014  . Complaining of back-related symptom 03/15/2014  . Arthralgia of hip or thigh 03/15/2014  . Gonalgia 10/10/2013  . 1st degree AV block 08/04/2013  . Infection of the upper respiratory tract 04/05/2013  . Cataract 02/24/2013  . Pseudoaphakia 02/24/2013  . Episode of syncope 01/11/2013  . Cardiac murmur 11/09/2012  . Near syncope 11/09/2012  . Anxiety 06/18/2012  . Clinical depression 06/18/2012  . Bleeding gastrointestinal 06/18/2012  . Diverticulitis 06/18/2012  . Difficulty hearing 06/18/2012  . Adaptive colitis 06/18/2012  . Arthritis, degenerative 06/18/2012  . Disease of thyroid gland 06/18/2012  . Multinodular goiter 01/29/2012  . 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 Adenoma of large intestine 05/31/2011  . Cephalalgia 05/31/2011  . Hypercholesterolemia 05/31/2011  . Benign prostatic hyperplasia with urinary obstruction 05/31/2011  . LBP (low  back pain) 05/31/2011  . Diabetes mellitus ([place]) 05/31/2011  . Essential (primary) hypertension 05/31/2011    Past Surgical History:  Procedure Laterality Date  . APPENDECTOMY     as child  . BACK SURGERY     2000 IDET SPINAL PROC  . CHOLECYSTECTOMY N/A 04/07/2016   Procedure: CHOLECYSTECTOMY;  Surgeon: Aviva Signs,  MD;  Location: [facility] ORS;  Service: General;  Laterality: N/A;  . [place]  . COLONOSCOPY N/A 01/07/2018   Procedure: COLONOSCOPY;  Surgeon: Rogene Houston, MD;  Location: [facility] ENDO SUITE;  Service: Endoscopy;  Laterality: N/A;  1:00  . CRYOTHERAPY    . EYE SURGERY     right cataract with lens implant  . FEMUR SURGERY     RT LEG        1953  . HAND SURGERY     RIGHT  . HERNIA REPAIR     1-right inguinal, 3- left inguinal  . IRRIGATION AND DEBRIDEMENT KNEE Left 07/09/2015   Procedure: LEFT KNEE IRRIGATION AND DEBRIDEMENT WOUND CLOSURE;  Surgeon: Gaynelle Arabian, MD;  Location: [facility] ORS;  Service: Orthopedics;  Laterality: Left;  . JOINT REPLACEMENT  02/28/2016   lt knee revision  . KNEE DEBRIDEMENT     2016   LEFT  . MENISCUS REPAIR     LEFT  . POLYPECTOMY  01/07/2018   Procedure: POLYPECTOMY;  Surgeon: Rogene Houston, MD;  Location: [facility] ENDO SUITE;  Service: Endoscopy;;  colon   . SHOULDER OPEN ROTATOR CUFF REPAIR     RT SHOULDER  . SPINAL FUSION    . SPINAL FUSION    . TONSILLECTOMY    . TOTAL KNEE ARTHROPLASTY Left 05/28/2015   Procedure: LEFT TOTAL KNEE ARTHROPLASTY;  Surgeon: Gaynelle Arabian, MD;  Location: [facility] ORS;  Service: Orthopedics;  Laterality: Left;  . TOTAL KNEE REVISION Left 02/28/2016   Procedure: LEFT TOTAL KNEE REVISION;  Surgeon: Leandrew Koyanagi, MD;  Location: [facility];  Service: Orthopedics;  Laterality: Left;  . TRIGGER FINGER RELEASE     RT HAND   1990S  . UVULECTOMY  2004   TURBINATE,TONSIL,ADENOID       Family History  Problem Relation Age of Onset  . Colon cancer Sister   . Hypertension Other   . Depression Brother   . Depression Daughter     Social History   Tobacco Use  . Smoking status: Former Smoker    Packs/day: 0.10    Years: 18.00    Pack years: 1.80    Types: Cigarettes    Start date: 12/18/1958    Quit date: 12/01/1986    Years since quitting: 33.7  . Smokeless tobacco: Never Used  Substance Use Topics  .  Alcohol use: No    Alcohol/week: 0.0 standard drinks  . Drug use: No    Home Medications Prior to Admission medications   Medication Sig Start Date End Date Taking? Authorizing Provider  alfuzosin (UROXATRAL) 10 MG 24 hr tablet Take 1 tablet (10 mg total) by mouth at bedtime. 04/18/20   McKenzie, Candee Furbish, MD  Apremilast (OTEZLA) 10 & 20 & 30 MG TBPK Take 10-30 mg by mouth See admin instructions. Take 10 mg by mouth daily on day 1, take 10 mg by mouth twice daily on day 2, take 10 mg by mouth in the morning and take 20 mg by mouth in the evening on day 3, take 20 mg by mouth twice daily on  day 4, take 20 mg by mouth in the morning and take 30 mg by mouth in the evening on day 5, take 30 mg by mouth twice daily on day 6, then continue 30 mg by mouth twice daily    [provider]  atorvastatin (LIPITOR) 40 MG tablet Take 1 tablet (40 mg total) by mouth daily. 03/05/20 07/11/20  Doree Albee, MD  bismuth subsalicylate (PEPTO BISMOL) 262 MG/15ML suspension Take 30 mLs by mouth every 6 (six) hours as needed for diarrhea or loose stools.    [provider]  Cholecalciferol (VITAMIN D3) 5000 units CAPS Take 5,000 Units by mouth at bedtime.    [provider]  diclofenac sodium (VOLTAREN) 1 % GEL Apply 2 g topically daily as needed (for pain). 11/22/15   Dohmeier, Asencion Partridge, MD  escitalopram (LEXAPRO) 10 MG tablet Take 20 mg by mouth at bedtime.  01/31/16   [provider]  escitalopram (LEXAPRO) 20 MG tablet escitalopram 20 mg tablet  TAKE 1 TABLET BY MOUTH EVERY DAY    [provider]  finasteride (PROSCAR) 5 MG tablet Take 1 tablet (5 mg total) by mouth daily. 04/18/20   McKenzie, Candee Furbish, MD  fluocinonide cream (LIDEX) 9.47 % Apply 1 application topically daily as needed (for irritation).     [provider]  fluticasone (FLONASE) 50 MCG/ACT nasal spray Place 2 sprays into both nostrils daily. 04/19/19   Wurst, [place], PA-C  hydrocortisone 2.5 %  cream Apply 1 application topically 2 (two) times daily as needed (for psoriasis).    [provider]  midodrine (PROAMATINE) 10 MG tablet Take 10 mg by mouth 3 (three) times daily.     [provider]  pantoprazole (PROTONIX) 20 MG tablet TAKE 1 TABLET BY MOUTH EVERY DAY BEFORE BREAKFAST 08/15/20   Laurine Blazer A, PA-C  pregabalin (LYRICA) 50 MG capsule TAKE ONE CAPSULE BY MOUTH EVERY DAY 05/13/20   Hurshel Party C, MD  pregabalin (LYRICA) 75 MG capsule pregabalin 75 mg capsule  TAKE 1 CAPSULE BY MOUTH TWICE DAILY    [provider]  topiramate (TOPAMAX) 25 MG tablet Take 50 mg by mouth 2 (two) times daily.     [provider]  topiramate (TOPAMAX) 50 MG tablet topiramate 50 mg tablet  TAKE 1 TABLET BY MOUTH TWICE DAILY    [provider]  venlafaxine XR (EFFEXOR-XR) 37.5 MG 24 hr capsule TAKE ONE CAPSULE BY MOUTH EVERY MORNING 07/09/20   Anastasio Champion, Nimish C, MD  vitamin B-12 (CYANOCOBALAMIN) 500 MCG tablet Take 1,000 mcg by mouth daily.    [provider]  zolpidem (AMBIEN) 5 MG tablet Take 5-10 mg by mouth at bedtime. 05/16/20   [provider]  atorvastatin (LIPITOR) 40 MG tablet Take 40 mg by mouth daily. 03/29/19   [provider]    Allergies    Iodine, Calan [verapamil], Cymbalta [duloxetine hcl], Iodinated diagnostic agents, Latex, Lisinopril, Neurontin [gabapentin], Zoloft [sertraline hcl], Valium [diazepam], Flomax [tamsulosin hcl], Glimepiride, Melatonin, and Tape  Review of Systems   Review of Systems  Constitutional:       Per HPI, otherwise negative  HENT:       Per HPI, otherwise negative  Respiratory:       Per HPI, otherwise negative  Cardiovascular:       Per HPI, otherwise negative  Gastrointestinal: Negative for vomiting.  Endocrine:       Negative aside from HPI  Genitourinary:       Neg  aside from HPI   Musculoskeletal:       Per HPI, otherwise negative  Skin: Negative.   Neurological:  Positive for weakness and light-headedness. Negative for syncope.    Physical Exam Updated Vital Signs BP (!) 116/48   Pulse 65   Temp 98.9 F (37.2 C) (Oral)   Resp (!) 23   Ht 6\' 2"  (1.88 m)   Wt 93 kg   SpO2 97%   BMI 26.32 kg/m   Physical Exam Vitals and nursing note reviewed.  Constitutional:      General: He is not in acute distress.    Appearance: He is well-developed.  HENT:     Head: Normocephalic and atraumatic.  Eyes:     Conjunctiva/sclera: Conjunctivae normal.  Cardiovascular:     Rate and Rhythm: Normal rate and regular rhythm.  Pulmonary:     Effort: Pulmonary effort is normal. No respiratory distress.     Breath sounds: No stridor.  Abdominal:     General: There is no distension.  Skin:    General: Skin is warm and dry.  Neurological:     Mental Status: He is alert and oriented to person, place, and time.     ED Results / Procedures / Treatments   Labs (all labs ordered are listed, but only abnormal results are displayed) Labs Reviewed  COMPREHENSIVE METABOLIC PANEL - Abnormal; Notable for the following components:      Result Value   Glucose, Bld 207 (*)    Creatinine, Ser 1.49 (*)    Total Protein 6.1 (*)    Albumin 3.4 (*)    GFR calc non Af Amer 41 (*)    All other components within normal limits  CBC WITH DIFFERENTIAL/PLATELET - Abnormal; Notable for the following components:   WBC 18.6 (*)    RBC 4.02 (*)    Hemoglobin 12.6 (*)    HCT 38.5 (*)    Neutro Abs 16.3 (*)    Lymphs Abs 0.6 (*)    Monocytes Absolute 1.5 (*)    Abs Immature Granulocytes 0.12 (*)    All other components within normal limits  URINALYSIS, ROUTINE W REFLEX MICROSCOPIC - Abnormal; Notable for the following components:   APPearance HAZY (*)    All other components within normal limits  CBG MONITORING, ED - Abnormal; Notable for the following components:   Glucose-Capillary 223 (*)    All other components within normal limits  MAGNESIUM    EKG EKG  Interpretation  Date/Time:  Tuesday September 04 2020 08:53:05 EDT Ventricular Rate:  89 PR Interval:    QRS Duration: 91 QT Interval:  382 QTC Calculation: 465 R Axis:   -56 Text Interpretation: Sinus rhythm Atrial premature complexes Abnormal R-wave progression, early transition Inferior infarct, old No significant change since last tracing Abnormal ECG Confirmed by Carmin Muskrat [phone]) on 09/04/2020 9:23:51 AM   Radiology DG Chest Port 1 View  Result Date: 09/04/2020 CLINICAL DATA:  Weakness, fall EXAM: PORTABLE CHEST 1 VIEW COMPARISON:  04/19/2019 FINDINGS: The heart size and mediastinal contours are stable. Atherosclerotic calcification of the aortic knob. No focal airspace consolidation, pleural effusion, or pneumothorax. The visualized skeletal structures are unremarkable. IMPRESSION: No active disease. Electronically Signed   By: Davina Poke D.O.   On: 09/04/2020 09:29    Procedures Procedures (including critical care time)  Medications Ordered in ED Medications  0.9 %  sodium chloride infusion ( Intravenous Stopped 09/04/20 1335)    ED Course  I have reviewed  the triage vital signs and the nursing notes.  Pertinent labs & imaging results that were available during my care of the patient were reviewed by me and considered in my medical decision making (see chart for details).  Update:, Patient accompanied by his daughter. He continues to have no new complaints. He is awake, alert, hemodynamically unremarkable I discussed patient's ongoing therapy for prostate cancer including Lupron provision yesterday. Discussed possibilities for his episode of fall, near syncope today, including orthostatic hypotension, possibly medication related given his new pharmaceuticals. Line no evidence for fracture, patient has no pain, no evidence for pneumonia, no evidence for decompensated state. Patient has baseline mild elevation in creatinine, has received fluid resuscitation. Given  his absence of symptoms here, patient appropriate for, amenable to outpatient therapy.    Final Clinical Impression(s) / ED Diagnoses Final diagnoses:  Fall, initial encounter  Weakness     Carmin Muskrat, MD 09/04/20 1527

## 2020-09-04 NOTE — ED Notes (Signed)
Pt ambulated approximately 30 feet. Tolerated well.

## 2020-09-09 ENCOUNTER — Other Ambulatory Visit (INDEPENDENT_AMBULATORY_CARE_PROVIDER_SITE_OTHER): Payer: Self-pay | Admitting: Internal Medicine

## 2020-09-09 ENCOUNTER — Other Ambulatory Visit (INDEPENDENT_AMBULATORY_CARE_PROVIDER_SITE_OTHER): Payer: Self-pay | Admitting: Gastroenterology

## 2020-09-10 NOTE — Telephone Encounter (Signed)
noted 

## 2020-09-10 NOTE — Telephone Encounter (Signed)
Will refill 1 month supply but patient has not been seen in the clinic in 2 years. He will need to follow-up in clinic for further refills.  Jared Peppers, MD Gastroenterology and Hepatology [facility]

## 2020-09-18 DIAGNOSIS — Z981 Arthrodesis status: Secondary | ICD-10-CM | POA: Diagnosis not present

## 2020-09-18 DIAGNOSIS — S7001XA Contusion of right hip, initial encounter: Secondary | ICD-10-CM | POA: Diagnosis not present

## 2020-09-18 DIAGNOSIS — M25551 Pain in right hip: Secondary | ICD-10-CM | POA: Diagnosis not present

## 2020-09-18 DIAGNOSIS — Y92008 Other place in unspecified non-institutional (private) residence as the place of occurrence of the external cause: Secondary | ICD-10-CM | POA: Diagnosis not present

## 2020-09-18 DIAGNOSIS — W010XXA Fall on same level from slipping, tripping and stumbling without subsequent striking against object, initial encounter: Secondary | ICD-10-CM | POA: Diagnosis not present

## 2020-09-18 DIAGNOSIS — M7061 Trochanteric bursitis, right hip: Secondary | ICD-10-CM | POA: Diagnosis not present

## 2020-09-18 DIAGNOSIS — M160 Bilateral primary osteoarthritis of hip: Secondary | ICD-10-CM | POA: Diagnosis not present

## 2020-10-05 DIAGNOSIS — M7061 Trochanteric bursitis, right hip: Secondary | ICD-10-CM | POA: Diagnosis not present

## 2020-10-09 ENCOUNTER — Other Ambulatory Visit (INDEPENDENT_AMBULATORY_CARE_PROVIDER_SITE_OTHER): Payer: Self-pay | Admitting: Gastroenterology

## 2020-10-09 NOTE — Telephone Encounter (Signed)
Last seen 11/26/2017 Rectal bleed by Karna Christmas

## 2020-10-11 DIAGNOSIS — Z79899 Other long term (current) drug therapy: Secondary | ICD-10-CM | POA: Diagnosis not present

## 2020-10-11 DIAGNOSIS — F17210 Nicotine dependence, cigarettes, uncomplicated: Secondary | ICD-10-CM | POA: Diagnosis not present

## 2020-10-11 DIAGNOSIS — R42 Dizziness and giddiness: Secondary | ICD-10-CM | POA: Diagnosis not present

## 2020-10-24 ENCOUNTER — Ambulatory Visit: Payer: Medicare Other | Admitting: Urology

## 2020-10-30 DIAGNOSIS — G43711 Chronic migraine without aura, intractable, with status migrainosus: Secondary | ICD-10-CM | POA: Diagnosis not present

## 2020-10-30 DIAGNOSIS — R2689 Other abnormalities of gait and mobility: Secondary | ICD-10-CM | POA: Diagnosis not present

## 2020-10-30 DIAGNOSIS — I951 Orthostatic hypotension: Secondary | ICD-10-CM | POA: Diagnosis not present

## 2020-10-30 DIAGNOSIS — E538 Deficiency of other specified B group vitamins: Secondary | ICD-10-CM | POA: Diagnosis not present

## 2020-10-30 DIAGNOSIS — E1140 Type 2 diabetes mellitus with diabetic neuropathy, unspecified: Secondary | ICD-10-CM | POA: Diagnosis not present

## 2020-10-31 DIAGNOSIS — R399 Unspecified symptoms and signs involving the genitourinary system: Secondary | ICD-10-CM | POA: Insufficient documentation

## 2020-11-01 ENCOUNTER — Ambulatory Visit (INDEPENDENT_AMBULATORY_CARE_PROVIDER_SITE_OTHER): Payer: Medicare Other

## 2020-11-01 ENCOUNTER — Ambulatory Visit
Admission: EM | Admit: 2020-11-01 | Discharge: 2020-11-01 | Disposition: A | Discharge status: Home / Self Care | Payer: Medicare Other | Attending: Emergency Medicine | Admitting: Emergency Medicine

## 2020-11-01 ENCOUNTER — Other Ambulatory Visit: Payer: Self-pay

## 2020-11-01 DIAGNOSIS — M25572 Pain in left ankle and joints of left foot: Secondary | ICD-10-CM

## 2020-11-01 DIAGNOSIS — S99922A Unspecified injury of left foot, initial encounter: Secondary | ICD-10-CM

## 2020-11-01 DIAGNOSIS — R0781 Pleurodynia: Secondary | ICD-10-CM

## 2020-11-01 DIAGNOSIS — S92102A Unspecified fracture of left talus, initial encounter for closed fracture: Secondary | ICD-10-CM | POA: Diagnosis not present

## 2020-11-01 DIAGNOSIS — S299XXA Unspecified injury of thorax, initial encounter: Secondary | ICD-10-CM

## 2020-11-01 DIAGNOSIS — R0789 Other chest pain: Secondary | ICD-10-CM

## 2020-11-01 DIAGNOSIS — S99912A Unspecified injury of left ankle, initial encounter: Secondary | ICD-10-CM | POA: Diagnosis not present

## 2020-11-01 DIAGNOSIS — Z043 Encounter for examination and observation following other accident: Secondary | ICD-10-CM | POA: Diagnosis not present

## 2020-11-01 DIAGNOSIS — M79672 Pain in left foot: Secondary | ICD-10-CM | POA: Diagnosis not present

## 2020-11-01 DIAGNOSIS — M7989 Other specified soft tissue disorders: Secondary | ICD-10-CM | POA: Diagnosis not present

## 2020-11-01 DIAGNOSIS — S92155A Nondisplaced avulsion fracture (chip fracture) of left talus, initial encounter for closed fracture: Secondary | ICD-10-CM

## 2020-11-01 MED ORDER — HYDROCODONE-ACETAMINOPHEN 5-325 MG PO TABS: 1.0000 | ORAL_TABLET | Freq: Three times a day (TID) | ORAL | 0 refills | Status: DC | PRN

## 2020-11-01 NOTE — ED Triage Notes (Signed)
Pt fell last night while ambulating to bathroom, left foot swelling noted, contusion to right hand covered with bandage

## 2020-11-01 NOTE — Discharge Instructions (Signed)
Rib pain, LT ankle pain; LT foot pain X-rays positive for avulsion fracture of the talus; foot and chest x-ray negative Splint/ boot placed.  Remain non-weight-bearing until cleared by orthpedist Continue conservative management of rest, ice, and elevation Alternate ibuprofen and tylenol as needed for pain norco for severe break-through pain Follow up with orthopedist for further evaluation and management Return or go to the ER if you have any new or worsening symptoms (fever, chills, chest pain, redness, swelling, deformity, etc...)   Skin tears:  Bandage applied Clean with mild soap and water Apply ointment to prevent secondary infection Monitor for signs of infection

## 2020-11-01 NOTE — ED Provider Notes (Signed)
[place]   [id number] 11/01/20 Arrival Time: 2993  CC: LT ankle and foot; LT rib pain  SUBJECTIVE: History from: patient. Jared Bridges is a 86 y.o. male complains of LT ankle and foot pain and injury x 2 days.  Trip and fall while walking to the bathroom.  Twisted and landed on LT foot/ ankle.  Localizes the pain to the front of ankle and top of foot.  Describes the pain as intermittent.  Has tried OTC medications with minimal relief.  Symptoms are made worse with weight-bearing.  Complains of associated swelling and bruising.  Also reports two skin tears to RUE following fall.  Denies fever, chills, CP, SOB, weakness.  Patient also mentions LT sided rib pan x couple of months.  Denies a precipitating event or trauma.  Has tried OTC medications without relief.  Sore to the touch.    ROS: As per HPI.  All other pertinent ROS negative.     Past Medical History:  Diagnosis Date  . Anemia, iron deficiency   . Chronic headaches    Migraines  . Chronic kidney disease    Resolved  . Depression   . Diverticulitis   . Essential hypertension   . GI bleed    Dr. Laural Golden - 1998  . Hypercholesteremia   . Orthostatic hypotension   . Peptic ulcer disease   . Peripheral neuropathy   . Polymyalgia rheumatica ([place])   . Stroke [facility])    Residual trouble reading and writing  . Syncope   . Thyroid nodule   . TIA (transient ischemic attack) 12/2017  . Type 2 diabetes mellitus ([place])    Past Surgical History:  Procedure Laterality Date  . APPENDECTOMY     as child  . BACK SURGERY     2000 IDET SPINAL PROC  . CHOLECYSTECTOMY N/A 04/07/2016   Procedure: CHOLECYSTECTOMY;  Surgeon: Aviva Signs, MD;  Location: [facility];  Service: General;  Laterality: N/A;  . [place]  . COLONOSCOPY N/A 01/07/2018   Procedure: COLONOSCOPY;  Surgeon: Rogene Houston, MD;  Location[facility];  Service: Endoscopy;  Laterality: N/A;  1:00  . CRYOTHERAPY    . EYE  SURGERY     right cataract with lens implant  . FEMUR SURGERY     RT LEG        1953  . HAND SURGERY     RIGHT  . HERNIA REPAIR     1-right inguinal, 3- left inguinal  . IRRIGATION AND DEBRIDEMENT KNEE Left 07/09/2015   Procedure: LEFT KNEE IRRIGATION AND DEBRIDEMENT WOUND CLOSURE;  Surgeon: Gaynelle Arabian, MD;  [facility];  Service: Orthopedics;  Laterality: Left;  . JOINT REPLACEMENT  02/28/2016   lt knee revision  . KNEE DEBRIDEMENT     2016   LEFT  . MENISCUS REPAIR     LEFT  . POLYPECTOMY  01/07/2018   Procedure: POLYPECTOMY;  Surgeon: Rogene Houston, MD;  [facility];  Service: Endoscopy;;  colon   . SHOULDER OPEN ROTATOR CUFF REPAIR     RT SHOULDER  . SPINAL FUSION    . SPINAL FUSION    . TONSILLECTOMY    . TOTAL KNEE ARTHROPLASTY Left 05/28/2015   Procedure: LEFT TOTAL KNEE ARTHROPLASTY;  Surgeon: Gaynelle Arabian, MD;  [facility];  Service: Orthopedics;  Laterality: Left;  . TOTAL KNEE REVISION Left 02/28/2016   Procedure: LEFT TOTAL KNEE REVISION;  Surgeon: Leandrew Koyanagi, MD;  Location: [place];  Service: Orthopedics;  Laterality: Left;  . TRIGGER FINGER RELEASE     RT HAND   1990S  . UVULECTOMY  2004   TURBINATE,TONSIL,ADENOID   Allergies  Allergen Reactions  . Iodine Hives, Itching and Other (See Comments)    very allergic(per daughter), can pre-med with benadryl  . Calan [Verapamil] Other (See Comments)    Weakness  . Cymbalta [Duloxetine Hcl] Other (See Comments)    Makes patient have jerking motions.  . Iodinated Diagnostic Agents Hives, Itching and Other (See Comments)    Can pre-med with benadryl  . Latex Other (See Comments)    Redness iritation  . Lisinopril Other (See Comments)    Makes patient have jerking motions.  . Neurontin [Gabapentin] Other (See Comments)    Makes patient have jerking motions.  [name] Kitchen Zoloft [Sertraline Hcl] Diarrhea and Other (See Comments)    Makes patient have jerking motions.  . Valium [Diazepam] Other (See  Comments)    "Did not work"  . Flomax [Tamsulosin Hcl] Other (See Comments)    Dizziness  . Glimepiride Other (See Comments)    "Did not work"  . Melatonin Nausea Only  . Tape Other (See Comments)    Skin irritation   No current facility-administered medications on file prior to encounter.   Current Outpatient Medications on File Prior to Encounter  Medication Sig Dispense Refill  . alfuzosin (UROXATRAL) 10 MG 24 hr tablet Take 1 tablet (10 mg total) by mouth at bedtime. 30 tablet 11  . Apremilast (OTEZLA) 10 & 20 & 30 MG TBPK Take 10-30 mg by mouth See admin instructions. Take 10 mg by mouth daily on day 1, take 10 mg by mouth twice daily on day 2, take 10 mg by mouth in the morning and take 20 mg by mouth in the evening on day 3, take 20 mg by mouth twice daily on day 4, take 20 mg by mouth in the morning and take 30 mg by mouth in the evening on day 5, take 30 mg by mouth twice daily on day 6, then continue 30 mg by mouth twice daily    . atorvastatin (LIPITOR) 40 MG tablet TAKE 1 TABLET BY MOUTH DAILY 90 tablet 0  . bismuth subsalicylate (PEPTO BISMOL) 262 MG/15ML suspension Take 30 mLs by mouth every 6 (six) hours as needed for diarrhea or loose stools.    . Cholecalciferol (VITAMIN D3) 5000 units CAPS Take 5,000 Units by mouth at bedtime.    . diclofenac sodium (VOLTAREN) 1 % GEL Apply 2 g topically daily as needed (for pain). 2 g 1  . escitalopram (LEXAPRO) 10 MG tablet Take 20 mg by mouth at bedtime.   1  . escitalopram (LEXAPRO) 20 MG tablet escitalopram 20 mg tablet  TAKE 1 TABLET BY MOUTH EVERY DAY    . finasteride (PROSCAR) 5 MG tablet Take 1 tablet (5 mg total) by mouth daily. 90 tablet 3  . fluocinonide cream (LIDEX) 0.98 % Apply 1 application topically daily as needed (for irritation).     . fluticasone (FLONASE) 50 MCG/ACT nasal spray Place 2 sprays into both nostrils daily. 16 g 0  . hydrocortisone 2.5 % cream Apply 1 application topically 2 (two) times daily as needed (for  psoriasis).    . midodrine (PROAMATINE) 10 MG tablet Take 10 mg by mouth 3 (three) times daily.     . pantoprazole (PROTONIX) 20 MG tablet Take 1 tablet (20  mg total) by mouth daily. Due for yearly office visit 30 tablet 0  . pregabalin (LYRICA) 50 MG capsule TAKE ONE CAPSULE BY MOUTH EVERY DAY 30 capsule 2  . pregabalin (LYRICA) 75 MG capsule pregabalin 75 mg capsule  TAKE 1 CAPSULE BY MOUTH TWICE DAILY    . topiramate (TOPAMAX) 25 MG tablet Take 50 mg by mouth 2 (two) times daily.     [name] Kitchen topiramate (TOPAMAX) 50 MG tablet topiramate 50 mg tablet  TAKE 1 TABLET BY MOUTH TWICE DAILY    . venlafaxine XR (EFFEXOR-XR) 37.5 MG 24 hr capsule TAKE ONE CAPSULE BY MOUTH EVERY MORNING 30 capsule 3  . vitamin B-12 (CYANOCOBALAMIN) 500 MCG tablet Take 1,000 mcg by mouth daily.    [name] Kitchen zolpidem (AMBIEN) 5 MG tablet Take 5-10 mg by mouth at bedtime.    . [DISCONTINUED] atorvastatin (LIPITOR) 40 MG tablet Take 40 mg by mouth daily.     Social History   Socioeconomic History  . Marital status: Married    Spouse name: Not on file  . Number of children: Not on file  . Years of education: Not on file  . Highest education level: Not on file  Occupational History  . Not on file  Tobacco Use  . Smoking status: Former Smoker    Packs/day: 0.10    Years: 18.00    Pack years: 1.80    Types: Cigarettes    Start date: 12/18/1958    Quit date: 12/01/1986    Years since quitting: 33.9  . Smokeless tobacco: Never Used  Substance and Sexual Activity  . Alcohol use: No    Alcohol/week: 0.0 standard drinks  . Drug use: No  . Sexual activity: Never  Other Topics Concern  . 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   Social Determinants of Health   Financial Resource Strain:   . Difficulty of Paying Living Expenses: Not on file  Food Insecurity:   . Worried About Charity fundraiser in the Last Year: Not on file  . Ran Out of Food in the Last Year: Not on file    Transportation Needs:   . Lack of Transportation (Medical): Not on file  . Lack of Transportation (Non-Medical): Not on file  Physical Activity:   . Days of Exercise per Week: Not on file  . Minutes of Exercise per Session: Not on file  Stress:   . Feeling of Stress : Not on file  Social Connections:   . Frequency of Communication with Friends and Family: Not on file  . Frequency of Social Gatherings with Friends and Family: Not on file  . Attends Religious Services: Not on file  . Active Member of Clubs or Organizations: Not on file  . Attends Archivist Meetings: Not on file  . Marital Status: Not on file  Intimate Partner Violence:   . Fear of Current or Ex-Partner: Not on file  . Emotionally Abused: Not on file  . Physically Abused: Not on file  . Sexually Abused: Not on file   Family History  Problem Relation Age of Onset  . Colon cancer Sister   . Hypertension Other   . Depression Brother   . Depression Daughter     OBJECTIVE:  Vitals:   11/01/20 1604  BP: 119/72  Pulse: 77  Resp: 17  Temp: 98.4 F (36.9 C)  TempSrc: Oral  SpO2: 94%    General appearance: ALERT; in no  acute distress.  Head: NCAT Lungs: Normal respiratory effort; CTAB CV: Dorsalis pedis pulse 2+; RRR Musculoskeletal: LT ankle/ foot/ rib Inspection: Ecchymosis and swelling to anterior ankle Palpation: diffusely TTP over anterior, lateral, and medial ankle; as well as distal lateral foot; TTP over LT lateral ribs just inferior to axilla ROM: LROM about the foot Strength: deferred Skin: warm and dry; two 1-2 cm skin tears to proximal RUE Neurologic: Ambulates with walker Psychological: alert and cooperative; normal mood and affect  DIAGNOSTIC STUDIES:  DG Chest 2 View  Result Date: 11/01/2020 CLINICAL DATA:  86 year old male with fall. EXAM: CHEST - 2 VIEW COMPARISON:  Chest radiograph dated 09/04/2020. FINDINGS: No focal consolidation, pleural effusion or pneumothorax. The  cardiac silhouette is within limits. Atherosclerotic calcification of the aorta. No acute osseous pathology. Degenerative changes of the spine. IMPRESSION: No active cardiopulmonary disease. Electronically Signed   By: Anner Crete M.D.   On: 11/01/2020 16:37   DG Ankle Complete Left  Result Date: 11/01/2020 CLINICAL DATA:  [place] last night and injured ankle. EXAM: LEFT ANKLE COMPLETE - 3+ VIEW COMPARISON:  None. FINDINGS: The ankle mortise is maintained. No fracture of the medial or lateral malleolus is identified. There is an avulsion fracture off of the lateral talus moderate diffuse soft tissue swelling is noted. IMPRESSION: Avulsion fracture off of the lateral talus. Electronically Signed   By: Marijo Sanes M.D.   On: 11/01/2020 16:44   DG Foot Complete Left  Result Date: 11/01/2020 CLINICAL DATA:  [place] last evening and injured left foot. EXAM: LEFT FOOT - COMPLETE 3+ VIEW COMPARISON:  None. FINDINGS: Age related degenerative changes but no acute foot fractures are identified. Moderate dorsal soft tissue swelling is noted. Small calcaneal heel spur noted. IMPRESSION: Age related degenerative changes but no acute fracture. Electronically Signed   By: Marijo Sanes M.D.   On: 11/01/2020 16:41    I have reviewed the x-rays myself and the radiologist interpretation. I am in agreement with the radiologist interpretation.     ASSESSMENT & PLAN:  1. Left foot pain   2. Acute left ankle pain   3. Rib pain on left side   4. Closed nondisplaced avulsion fracture of left talus, initial encounter     Meds ordered this encounter  Medications  . HYDROcodone-acetaminophen (NORCO/VICODIN) 5-325 MG tablet    Sig: Take 1 tablet by mouth every 8 (eight) hours as needed.    Dispense:  10 tablet    Refill:  0    Order Specific Question:   Supervising Provider    Answer:   Raylene Everts [[id number]]   Rib pain, LT ankle pain; LT foot pain X-rays positive for avulsion fracture of the talus; foot  and chest x-ray negative Splint/ boot placed.  Remain non-weight-bearing until cleared by orthpedist Continue conservative management of rest, ice, and elevation Alternate ibuprofen and tylenol as needed for pain norco for severe break-through pain Follow up with orthopedist for further evaluation and management Return or go to the ER if you have any new or worsening symptoms (fever, chills, chest pain, redness, swelling, deformity, etc...)   Skin tears:  Bandage applied Clean with mild soap and water Apply ointment to prevent secondary infection Monitor for signs of infection  [place] Controlled Substances Registry consulted for this patient. I feel the risk/benefit ratio today is favorable for proceeding with this prescription for a controlled substance. Medication sedation precautions given.  Reviewed expectations re: course of current medical issues. Questions answered. Outlined signs  and symptoms indicating need for more acute intervention. Patient verbalized understanding. After Visit Summary given.    Lestine Box, PA-C 11/01/20 1706

## 2020-11-05 ENCOUNTER — Other Ambulatory Visit (INDEPENDENT_AMBULATORY_CARE_PROVIDER_SITE_OTHER): Payer: Self-pay | Admitting: Internal Medicine

## 2020-11-08 DIAGNOSIS — Z91040 Latex allergy status: Secondary | ICD-10-CM | POA: Diagnosis not present

## 2020-11-08 DIAGNOSIS — Z720 Tobacco use: Secondary | ICD-10-CM | POA: Diagnosis not present

## 2020-11-08 DIAGNOSIS — Z888 Allergy status to other drugs, medicaments and biological substances status: Secondary | ICD-10-CM | POA: Diagnosis not present

## 2020-11-08 DIAGNOSIS — R590 Localized enlarged lymph nodes: Secondary | ICD-10-CM | POA: Diagnosis not present

## 2020-11-12 ENCOUNTER — Encounter (INDEPENDENT_AMBULATORY_CARE_PROVIDER_SITE_OTHER): Payer: Self-pay | Admitting: Internal Medicine

## 2020-11-12 ENCOUNTER — Ambulatory Visit (INDEPENDENT_AMBULATORY_CARE_PROVIDER_SITE_OTHER): Payer: Medicare Other | Admitting: Internal Medicine

## 2020-11-12 ENCOUNTER — Other Ambulatory Visit: Payer: Self-pay

## 2020-11-12 VITALS — BP 122/68 | HR 36 | Temp 96.7°F | Ht 74.0 in | Wt 207.4 lb

## 2020-11-12 DIAGNOSIS — E1142 Type 2 diabetes mellitus with diabetic polyneuropathy: Secondary | ICD-10-CM

## 2020-11-12 DIAGNOSIS — E782 Mixed hyperlipidemia: Secondary | ICD-10-CM

## 2020-11-12 DIAGNOSIS — I10 Essential (primary) hypertension: Secondary | ICD-10-CM

## 2020-11-12 DIAGNOSIS — F330 Major depressive disorder, recurrent, mild: Secondary | ICD-10-CM | POA: Diagnosis not present

## 2020-11-12 DIAGNOSIS — C61 Malignant neoplasm of prostate: Secondary | ICD-10-CM | POA: Diagnosis not present

## 2020-11-12 NOTE — Progress Notes (Signed)
Metrics: Intervention Frequency ACO  Documented Smoking Status Yearly  Screened one or more times in 24 months  Cessation Counseling or  Active cessation medication Past 24 months  Past 24 months   Guideline developer: UpToDate (See UpToDate for funding source) Date Released: 2014       Wellness Office Visit  Subjective:  Patient ID: Jared Bridges, male    DOB: Sep 19, 1930  Age: 86 y.o. MRN: [id number]  CC: This man comes in for follow-up of his diabetes and polyneuropathy associated with the diabetes, depression, recent diagnosis and treatment of prostate cancer which is metastatic, hypertension and hyperlipidemia. HPI  The patient has been seen apparently at [facility] and has already undergone androgen deprivation therapy and is due to have localized radiotherapy to the prostate cancer.  The patient describes hot flashes but overall says he feels well. He continues on statin therapy for his hyperlipidemia. He continues on Lexapro for his depression which seems to be helping him. He continues with Lyrica for his neuropathy from his diabetes. Past Medical History:  Diagnosis Date  . Anemia, iron deficiency   . Chronic headaches    Migraines  . Chronic kidney disease    Resolved  . Depression   . Diverticulitis   . Essential hypertension   . GI bleed    Dr. Laural Golden - 1998  . Hypercholesteremia   . Orthostatic hypotension   . Peptic ulcer disease   . Peripheral neuropathy   . Polymyalgia rheumatica ([place])   . Stroke [facility])    Residual trouble reading and writing  . Syncope   . Thyroid nodule   . TIA (transient ischemic attack) 12/2017  . Type 2 diabetes mellitus ([place])    Past Surgical History:  Procedure Laterality Date  . APPENDECTOMY     as child  . BACK SURGERY     2000 IDET SPINAL PROC  . CHOLECYSTECTOMY N/A 04/07/2016   Procedure: CHOLECYSTECTOMY;  Surgeon: Aviva Signs, MD;  Location: [facility];  Service: General;  Laterality: N/A;  . [place]  . COLONOSCOPY N/A 01/07/2018   Procedure: COLONOSCOPY;  Surgeon: Rogene Houston, MD;  [facility];  Service: Endoscopy;  Laterality: N/A;  1:00  . CRYOTHERAPY    . EYE SURGERY     right cataract with lens implant  . FEMUR SURGERY     RT LEG        1953  . HAND SURGERY     RIGHT  . HERNIA REPAIR     1-right inguinal, 3- left inguinal  . IRRIGATION AND DEBRIDEMENT KNEE Left 07/09/2015   Procedure: LEFT KNEE IRRIGATION AND DEBRIDEMENT WOUND CLOSURE;  Surgeon: Gaynelle Arabian, MD;  [facility];  Service: Orthopedics;  Laterality: Left;  . JOINT REPLACEMENT  02/28/2016   lt knee revision  . KNEE DEBRIDEMENT     2016   LEFT  . MENISCUS REPAIR     LEFT  . POLYPECTOMY  01/07/2018   Procedure: POLYPECTOMY;  Surgeon: Rogene Houston, MD;  [facility];  Service: Endoscopy;;  colon   . SHOULDER OPEN ROTATOR CUFF REPAIR     RT SHOULDER  . SPINAL FUSION    . SPINAL FUSION    . TONSILLECTOMY    . TOTAL KNEE ARTHROPLASTY Left 05/28/2015   Procedure: LEFT TOTAL KNEE ARTHROPLASTY;  Surgeon: Gaynelle Arabian, MD;  [facility];  Service: Orthopedics;  Laterality: Left;  . TOTAL KNEE  REVISION Left 02/28/2016   Procedure: LEFT TOTAL KNEE REVISION;  Surgeon: Leandrew Koyanagi, MD;  Location: [place];  Service: Orthopedics;  Laterality: Left;  . TRIGGER FINGER RELEASE     RT HAND   1990S  . UVULECTOMY  2004   TURBINATE,TONSIL,ADENOID     Family History  Problem Relation Age of Onset  . Colon cancer Sister   . Hypertension Other   . Depression Brother   . 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   Social History   Tobacco Use  . Smoking status: Former Smoker    Packs/day: 0.10    Years: 18.00    Pack years: 1.80    Types: Cigarettes    Start date: 12/18/1958    Quit date: 12/01/1986    Years since quitting: 33.9  . Smokeless tobacco: Never Used  Substance Use Topics  . Alcohol  use: No    Alcohol/week: 0.0 standard drinks    Current Meds  Medication Sig  . Apremilast (OTEZLA) 10 & 20 & 30 MG TBPK Take 10-30 mg by mouth See admin instructions. Take 10 mg by mouth daily on day 1, take 10 mg by mouth twice daily on day 2, take 10 mg by mouth in the morning and take 20 mg by mouth in the evening on day 3, take 20 mg by mouth twice daily on day 4, take 20 mg by mouth in the morning and take 30 mg by mouth in the evening on day 5, take 30 mg by mouth twice daily on day 6, then continue 30 mg by mouth twice daily  . atorvastatin (LIPITOR) 40 MG tablet TAKE 1 TABLET BY MOUTH DAILY  . bismuth subsalicylate (PEPTO BISMOL) 262 MG/15ML suspension Take 30 mLs by mouth every 6 (six) hours as needed for diarrhea or loose stools.  . Cholecalciferol (VITAMIN D3) 5000 units CAPS Take 5,000 Units by mouth at bedtime.  . diclofenac sodium (VOLTAREN) 1 % GEL Apply 2 g topically daily as needed (for pain).  . enzalutamide (XTANDI) 80 MG tablet Take by mouth.  . escitalopram (LEXAPRO) 10 MG tablet Take 20 mg by mouth at bedtime.  [name] Kitchen escitalopram (LEXAPRO) 20 MG tablet escitalopram 20 mg tablet  TAKE 1 TABLET BY MOUTH EVERY DAY  . finasteride (PROSCAR) 5 MG tablet Take 1 tablet (5 mg total) by mouth daily.  . fluocinonide cream (LIDEX) 4.01 % Apply 1 application topically daily as needed (for irritation).   . fluticasone (FLONASE) 50 MCG/ACT nasal spray Place 2 sprays into both nostrils daily.  [name] Kitchen HYDROcodone-acetaminophen (NORCO/VICODIN) 5-325 MG tablet Take 1 tablet by mouth every 8 (eight) hours as needed.  . hydrocortisone 2.5 % cream Apply 1 application topically 2 (two) times daily as needed (for psoriasis).  [name] Kitchen ibuprofen (ADVIL) 600 MG tablet Take by mouth.  . midodrine (PROAMATINE) 10 MG tablet Take 10 mg by mouth 3 (three) times daily.   . pantoprazole (PROTONIX) 20 MG tablet Take 1 tablet (20 mg total) by mouth daily. Due for yearly office visit  . pregabalin (LYRICA) 50 MG capsule  TAKE ONE CAPSULE BY MOUTH EVERY DAY  . pregabalin (LYRICA) 75 MG capsule pregabalin 75 mg capsule  TAKE 1 CAPSULE BY MOUTH TWICE DAILY  . topiramate (TOPAMAX) 25 MG tablet Take 50 mg by mouth 2 (two) times daily.  [name] Kitchen topiramate (TOPAMAX) 50 MG tablet topiramate 50 mg tablet  TAKE 1 TABLET  BY MOUTH TWICE DAILY  . venlafaxine XR (EFFEXOR-XR) 37.5 MG 24 hr capsule TAKE ONE CAPSULE BY MOUTH EVERY MORNING  . vitamin B-12 (CYANOCOBALAMIN) 500 MCG tablet Take 1,000 mcg by mouth daily.  [name] Kitchen zolpidem (AMBIEN) 5 MG tablet Take 5-10 mg by mouth at bedtime.      Depression screen [facility] 2/9 03/06/2020 12/07/2019  Decreased Interest 0 0  Down, Depressed, Hopeless 0 0  PHQ - 2 Score 0 0     Objective:   Today's Vitals: BP 122/68   Pulse (!) 36   Temp (!) 96.7 F (35.9 C) (Temporal)   Ht 6\' 2"  (1.88 m)   Wt 207 lb 6.4 oz (94.1 kg)   SpO2 97%   BMI 26.63 kg/m  Vitals with BMI 11/12/2020 11/01/2020 09/04/2020  Height 6\' 2"  - -  Weight 207 lbs 6 oz - -  BMI 70.96 - -  Systolic 438 381 840  Diastolic 68 72 48  Pulse 36 77 65  Some encounter information is confidential and restricted. Go to Review Flowsheets activity to see all data.     Physical Exam   Is somewhat looks frail compared to previous times I have seen him.  Blood pressure is in good control.    Assessment   1. Major depressive disorder, recurrent episode, mild ([place])   2. Diabetic polyneuropathy associated with type 2 diabetes mellitus ([place])   3. Essential hypertension   4. Prostate cancer ([place])   5. Mixed hyperlipidemia       Tests ordered Orders Placed This Encounter  Procedures  . COMPLETE METABOLIC PANEL WITH GFR  . Hemoglobin A1c  . Lipid panel     Plan: 1. He will continue with Lexapro for his major depressive disorder and this seems to be helping him. 2. He will continue with Lyrica for his diabetic polyneuropathy. 3. He will continue with statin therapy and we will check lipid panel. 4. I will check an A1c  as he is a diet-controlled diabetic in general. 5. As far as his prostate cancer is concerned, he is getting hot flashes and I have offered the patient the possibility of estradiol therapy to help with hot flashes.  He will discuss this with his oncologist/urologist. 6. Follow-up in 6 months.  I have recommended COVID-19 booster now.   No orders of the defined types were placed in this encounter.   Doree Albee, MD

## 2020-11-13 ENCOUNTER — Encounter (INDEPENDENT_AMBULATORY_CARE_PROVIDER_SITE_OTHER): Payer: Self-pay | Admitting: Internal Medicine

## 2020-11-13 ENCOUNTER — Telehealth (INDEPENDENT_AMBULATORY_CARE_PROVIDER_SITE_OTHER): Payer: Self-pay

## 2020-11-13 DIAGNOSIS — L814 Other melanin hyperpigmentation: Secondary | ICD-10-CM | POA: Diagnosis not present

## 2020-11-13 DIAGNOSIS — C7951 Secondary malignant neoplasm of bone: Secondary | ICD-10-CM

## 2020-11-13 DIAGNOSIS — R233 Spontaneous ecchymoses: Secondary | ICD-10-CM | POA: Diagnosis not present

## 2020-11-13 DIAGNOSIS — C61 Malignant neoplasm of prostate: Secondary | ICD-10-CM

## 2020-11-13 DIAGNOSIS — L400 Psoriasis vulgaris: Secondary | ICD-10-CM | POA: Diagnosis not present

## 2020-11-13 DIAGNOSIS — L821 Other seborrheic keratosis: Secondary | ICD-10-CM | POA: Diagnosis not present

## 2020-11-13 LAB — LIPID PANEL
Cholesterol: 145 mg/dL (ref <200)
HDL: 45 mg/dL (ref >=40)
LDL Cholesterol (Calc): 66 mg/dL (calc)
Non-HDL Cholesterol (Calc): 100 mg/dL (calc) (ref <130)
Total CHOL/HDL Ratio: 3.2 (calc) (ref <5.0)
Triglycerides: 243 mg/dL — ABNORMAL HIGH (ref <150)

## 2020-11-13 LAB — COMPLETE METABOLIC PANEL WITH GFR
AG Ratio: 1.4 (calc) (ref 1.0–2.5)
ALT: 9 U/L (ref 9–46)
AST: 11 U/L (ref 10–35)
Albumin: 3.8 g/dL (ref 3.6–5.1)
Alkaline phosphatase (APISO): 84 U/L (ref 35–144)
BUN/Creatinine Ratio: 17 (calc) (ref 6–22)
BUN: 20 mg/dL (ref 7–25)
CO2: 27 mmol/L (ref 20–32)
Calcium: 9.7 mg/dL (ref 8.6–10.3)
Chloride: 106 mmol/L (ref 98–110)
Creat: 1.19 mg/dL — ABNORMAL HIGH (ref 0.70–1.11)
GFR, Est African American: 63 mL/min/{1.73_m2} (ref >=60)
GFR, Est Non African American: 54 mL/min/{1.73_m2} — ABNORMAL LOW (ref >=60)
Globulin: 2.8 g/dL (calc) (ref 1.9–3.7)
Glucose, Bld: 202 mg/dL — ABNORMAL HIGH (ref 65–139)
Potassium: 4.6 mmol/L (ref 3.5–5.3)
Sodium: 141 mmol/L (ref 135–146)
Total Bilirubin: 0.6 mg/dL (ref 0.2–1.2)
Total Protein: 6.6 g/dL (ref 6.1–8.1)

## 2020-11-13 LAB — HEMOGLOBIN A1C
Hgb A1c MFr Bld: 6.6 % of total Hgb — ABNORMAL HIGH (ref <5.7)
Mean Plasma Glucose: 143 mg/dL
eAG (mmol/L): 7.9 mmol/L

## 2020-11-14 ENCOUNTER — Other Ambulatory Visit (INDEPENDENT_AMBULATORY_CARE_PROVIDER_SITE_OTHER): Payer: Self-pay | Admitting: Internal Medicine

## 2020-11-14 DIAGNOSIS — C61 Malignant neoplasm of prostate: Secondary | ICD-10-CM

## 2020-11-14 NOTE — Telephone Encounter (Signed)
Thank you :)

## 2020-11-14 NOTE — Telephone Encounter (Signed)
I have put the order in today.

## 2020-11-15 ENCOUNTER — Other Ambulatory Visit: Payer: Self-pay

## 2020-11-15 ENCOUNTER — Other Ambulatory Visit (INDEPENDENT_AMBULATORY_CARE_PROVIDER_SITE_OTHER): Payer: Medicare Other

## 2020-11-15 DIAGNOSIS — C7951 Secondary malignant neoplasm of bone: Secondary | ICD-10-CM | POA: Diagnosis not present

## 2020-11-16 LAB — PSA, TOTAL WITH REFLEX TO PSA, FREE: PSA, Total: <0.1 ng/mL (ref <=4.0)

## 2020-12-01 ENCOUNTER — Ambulatory Visit
Admission: EM | Admit: 2020-12-01 | Discharge: 2020-12-01 | Disposition: A | Discharge status: Home / Self Care | Payer: Medicare Other | Attending: Internal Medicine | Admitting: Internal Medicine

## 2020-12-01 ENCOUNTER — Encounter: Payer: Self-pay | Admitting: Emergency Medicine

## 2020-12-01 ENCOUNTER — Other Ambulatory Visit: Payer: Self-pay

## 2020-12-01 ENCOUNTER — Ambulatory Visit (INDEPENDENT_AMBULATORY_CARE_PROVIDER_SITE_OTHER): Payer: Medicare Other

## 2020-12-01 DIAGNOSIS — S93401A Sprain of unspecified ligament of right ankle, initial encounter: Secondary | ICD-10-CM | POA: Diagnosis not present

## 2020-12-01 DIAGNOSIS — S99911A Unspecified injury of right ankle, initial encounter: Secondary | ICD-10-CM | POA: Diagnosis not present

## 2020-12-01 DIAGNOSIS — M25571 Pain in right ankle and joints of right foot: Secondary | ICD-10-CM

## 2020-12-01 NOTE — Discharge Instructions (Addendum)
Ice area of pain for 15-20 min 3-4 times a day and elevate for 48h, then you may stop the ice after that. But continue elevating it. He may follow up with the orthopedist for his L ankle fracture.   I prescribed an anti-inflammatory cream to help with pain of R ankle.

## 2020-12-01 NOTE — ED Triage Notes (Signed)
Patient fell this morning and injured the right ankle. brusing and swelling. said his ankle twisted and fell.

## 2020-12-01 NOTE — ED Provider Notes (Signed)
[facility]    CSN: [id number] Arrival date & time: 12/01/20  1233      History   Chief Complaint Chief Complaint  Patient presents with  . Ankle Pain    HPI Jared Bridges is a 87 y.o. male who presents with R ankle pain due to twisting it this am when he was walking to the bathroom. He was not wearing his boot for his R ankle fracture when he was heading to the bathrrom. Has not had ortho apt for L foot fracture    Past Medical History:  Diagnosis Date  . Anemia, iron deficiency   . Chronic headaches    Migraines  . Chronic kidney disease    Resolved  . Depression   . Diverticulitis   . Essential hypertension   . GI bleed    Dr. Karilyn Cota - 1998  . Hypercholesteremia   . Orthostatic hypotension   . Peptic ulcer disease   . Peripheral neuropathy   . Polymyalgia rheumatica ([name]   . Stroke [facility])    Residual trouble reading and writing  . Syncope   . Thyroid nodule   . TIA (transient ischemic attack) 12/2017  . Type 2 diabetes mellitus [facility])     Patient Active Problem List   Diagnosis Date Noted  . Prostate cancer ([name] 06/20/2020  . Elevated PSA 04/18/2020  . Nocturia 04/18/2020  . Medicare annual wellness visit, subsequent 12/07/2019  . Depressed mood 12/07/2019  . Vitamin D deficiency 10/25/2019  . Memory loss 10/25/2019  . Fall 10/25/2019  . Migraine headaches 12/29/2017  . TIA (transient ischemic attack) 12/28/2017  . Rectal bleeding 11/26/2017  . Acute cholecystitis 04/05/2016  . 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 Total knee replacement status 02/28/2016  . CVA (cerebral infarction) 12/19/2015  . Acute CVA (cerebrovascular accident) ([name] 12/19/2015  . Carotid stenosis 12/19/2015  . Postoperative wound dehiscence 07/09/2015  . Type 2 diabetes mellitus, uncontrolled ([name] 06/29/2015  . Cellulitis of knee, left 06/29/2015  . Wound infection after surgery 06/29/2015  . OA (osteoarthritis) of knee 05/28/2015  . BRBPR  (bright red blood per rectum) 11/21/2014  . Orthostatic hypotension 11/21/2014  . Dizziness 11/16/2014  . Polymyalgia rheumatica ([name] 09/12/2014  . Anemia, iron deficiency 08/16/2014  . DJD (degenerative joint disease) 08/16/2014  . Essential hypertension   . Type 2 diabetes mellitus ([name]   . Cataract, nuclear 04/11/2014  . Complaining of back-related symptom 03/15/2014  . Arthralgia of hip or thigh 03/15/2014  . Gonalgia 10/10/2013  . 1st degree AV block 08/04/2013  . Infection of the upper respiratory tract 04/05/2013  . Cataract 02/24/2013  . Pseudoaphakia 02/24/2013  . Episode of syncope 01/11/2013  . Cardiac murmur 11/09/2012  . Near syncope 11/09/2012  . Anxiety 06/18/2012  . Clinical depression 06/18/2012  . Bleeding gastrointestinal 06/18/2012  . Diverticulitis 06/18/2012  . Difficulty hearing 06/18/2012  . Adaptive colitis 06/18/2012  . Arthritis, degenerative 06/18/2012  . Disease of thyroid gland 06/18/2012  . Multinodular goiter 01/29/2012  . 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 Adenoma of large intestine 05/31/2011  . Cephalalgia 05/31/2011  . Hypercholesterolemia 05/31/2011  . Benign prostatic hyperplasia with urinary obstruction 05/31/2011  . LBP (low back pain) 05/31/2011  . Diabetes mellitus ([name] 05/31/2011  . Essential (primary) hypertension 05/31/2011    Past Surgical History:  Procedure Laterality Date  . APPENDECTOMY     as child  . BACK SURGERY     2000 IDET SPINAL PROC  . CHOLECYSTECTOMY  N/A 04/07/2016   Procedure: CHOLECYSTECTOMY;  Surgeon: Aviva Signs, MD;  Location: [facility];  Service: General;  Laterality: N/A;  . [place]  . COLONOSCOPY N/A 01/07/2018   Procedure: COLONOSCOPY;  Surgeon: Rogene Houston, MD;  Location: [facility];  Service: Endoscopy;  Laterality: N/A;  1:00  . CRYOTHERAPY    . EYE SURGERY     right cataract with lens implant  . FEMUR SURGERY     RT LEG        1953  . HAND SURGERY     RIGHT   . HERNIA REPAIR     1-right inguinal, 3- left inguinal  . IRRIGATION AND DEBRIDEMENT KNEE Left 07/09/2015   Procedure: LEFT KNEE IRRIGATION AND DEBRIDEMENT WOUND CLOSURE;  Surgeon: Gaynelle Arabian, MD;  Location: [facility];  Service: Orthopedics;  Laterality: Left;  . JOINT REPLACEMENT  02/28/2016   lt knee revision  . KNEE DEBRIDEMENT     2016   LEFT  . MENISCUS REPAIR     LEFT  . POLYPECTOMY  01/07/2018   Procedure: POLYPECTOMY;  Surgeon: Rogene Houston, MD;  Location: [facility];  Service: Endoscopy;;  colon   . SHOULDER OPEN ROTATOR CUFF REPAIR     RT SHOULDER  . SPINAL FUSION    . SPINAL FUSION    . TONSILLECTOMY    . TOTAL KNEE ARTHROPLASTY Left 05/28/2015   Procedure: LEFT TOTAL KNEE ARTHROPLASTY;  Surgeon: Gaynelle Arabian, MD;  Location: [facility];  Service: Orthopedics;  Laterality: Left;  . TOTAL KNEE REVISION Left 02/28/2016   Procedure: LEFT TOTAL KNEE REVISION;  Surgeon: Leandrew Koyanagi, MD;  Location: [place];  Service: Orthopedics;  Laterality: Left;  . TRIGGER FINGER RELEASE     RT HAND   1990S  . UVULECTOMY  2004   TURBINATE,TONSIL,ADENOID       Home Medications    Prior to Admission medications   Medication Sig Start Date End Date Taking? Authorizing Provider  Apremilast (OTEZLA) 10 & 20 & 30 MG TBPK Take 10-30 mg by mouth See admin instructions. Take 10 mg by mouth daily on day 1, take 10 mg by mouth twice daily on day 2, take 10 mg by mouth in the morning and take 20 mg by mouth in the evening on day 3, take 20 mg by mouth twice daily on day 4, take 20 mg by mouth in the morning and take 30 mg by mouth in the evening on day 5, take 30 mg by mouth twice daily on day 6, then continue 30 mg by mouth twice daily    [provider]  atorvastatin (LIPITOR) 40 MG tablet TAKE 1 TABLET BY MOUTH DAILY 09/10/20   Hurshel Party C, MD  bismuth subsalicylate (PEPTO BISMOL) 262 MG/15ML suspension Take 30 mLs by mouth every 6 (six) hours as needed for diarrhea or loose stools.     [provider]  Cholecalciferol (VITAMIN D3) 5000 units CAPS Take 5,000 Units by mouth at bedtime.    [provider]  diclofenac sodium (VOLTAREN) 1 % GEL Apply 2 g topically daily as needed (for pain). 11/22/15   Dohmeier, Asencion Partridge, MD  enzalutamide Gillermina Phy) 80 MG tablet Take by mouth. 11/05/20   [provider]  escitalopram (LEXAPRO) 10 MG tablet Take 20 mg by mouth at bedtime. 01/31/16   [provider]  escitalopram (LEXAPRO) 20 MG tablet escitalopram 20 mg tablet  TAKE 1 TABLET  BY MOUTH EVERY DAY    [provider]  finasteride (PROSCAR) 5 MG tablet Take 1 tablet (5 mg total) by mouth daily. 04/18/20   McKenzie, Candee Furbish, MD  fluocinonide cream (LIDEX) [id number] % Apply 1 application topically daily as needed (for irritation).     [provider]  fluticasone (FLONASE) 50 MCG/ACT nasal spray Place 2 sprays into both nostrils daily. 04/19/19   Wurst, [place], PA-C  HYDROcodone-acetaminophen (NORCO/VICODIN) 5-325 MG tablet Take 1 tablet by mouth every 8 (eight) hours as needed. 11/01/20   Wurst, [place], PA-C  hydrocortisone 2.5 % cream Apply 1 application topically 2 (two) times daily as needed (for psoriasis).    [provider]  ibuprofen (ADVIL) 600 MG tablet Take by mouth. 08/03/20   [provider]  midodrine (PROAMATINE) 10 MG tablet Take 10 mg by mouth 3 (three) times daily.     [provider]  pantoprazole (PROTONIX) 20 MG tablet Take 1 tablet (20 mg total) by mouth daily. Due for yearly office visit 10/09/20   Ezzard Standing, PA-C  pregabalin (LYRICA) 75 MG capsule pregabalin 75 mg capsule  TAKE 1 CAPSULE BY MOUTH TWICE DAILY    [provider]  topiramate (TOPAMAX) 50 MG tablet topiramate 50 mg tablet  TAKE 1 TABLET BY MOUTH TWICE DAILY    [provider]  venlafaxine XR (EFFEXOR-XR) 37.5 MG 24 hr capsule TAKE ONE CAPSULE BY MOUTH EVERY MORNING 11/05/20   Ailene Ards, NP  vitamin B-12  (CYANOCOBALAMIN) 500 MCG tablet Take 1,000 mcg by mouth daily.    [provider]  zolpidem (AMBIEN) 5 MG tablet Take 5-10 mg by mouth at bedtime. 05/16/20   [provider]    Family History Family History  Problem Relation Age of Onset  . Colon cancer Sister   . Hypertension Other   . Depression Brother   . Depression Daughter     Social History Social History   Tobacco Use  . Smoking status: Former Smoker    Packs/day: 0.10    Years: 18.00    Pack years: 1.80    Types: Cigarettes    Start date: 12/18/1958    Quit date: 12/01/1986    Years since quitting: 34.0  . Smokeless tobacco: Never Used  Vaping Use  . Vaping Use: Never used  Substance Use Topics  . Alcohol use: No    Alcohol/week: 0.0 standard drinks  . Drug use: No     Allergies   Iodine, Calan [verapamil], Cymbalta [duloxetine hcl], Iodinated diagnostic agents, Latex, Lisinopril, Neurontin [gabapentin], Zoloft [sertraline hcl], Valium [diazepam], Flomax [tamsulosin hcl], Glimepiride, Melatonin, and Tape   Review of Systems Review of Systems   Physical Exam Triage Vital Signs ED Triage Vitals  Enc Vitals Group     BP 12/01/20 1359 103/67     Pulse Rate 12/01/20 1359 68     Resp 12/01/20 1359 15     Temp 12/01/20 1359 98.4 F (36.9 C)     Temp src --      SpO2 12/01/20 1359 98 %     Weight --      Height --      Head Circumference --      Peak Flow --      Pain Score 12/01/20 1406 5     Pain Loc --      Pain Edu? --      Excl. in [place]? --    No data found.  Updated Vital  Signs BP 103/67   Pulse 68   Temp 98.4 F (36.9 C)   Resp 15   SpO2 98%   Visual Acuity Right Eye Distance:   Left Eye Distance:   Bilateral Distance:    Right Eye Near:   Left Eye Near:    Bilateral Near:     Physical Exam Vitals and nursing note reviewed.  Constitutional:      General: He is not in acute distress.    Appearance: He is not toxic-appearing.  HENT:     Head: Normocephalic.   Eyes:     General: No scleral icterus.    Conjunctiva/sclera: Conjunctivae normal.  Pulmonary:     Effort: Pulmonary effort is normal.  Musculoskeletal:        General: Swelling, tenderness and signs of injury present. No deformity.     Cervical back: Neck supple.     Comments: R ANKLE- with moderate swelling with ecchymosis on medial ankle with moderate tenderness on this area, little less on lateral malleolus ROM is limited due to pain.   Skin:    General: Skin is warm and dry.     Findings: Bruising present. No rash.  Neurological:     Mental Status: He is alert and oriented to person, place, and time.  Psychiatric:        Mood and Affect: Mood normal.        Behavior: Behavior normal.        Thought Content: Thought content normal.        Judgment: Judgment normal.    UC Treatments / Results  Labs (all labs ordered are listed, but only abnormal results are displayed) Labs Reviewed - No data to display  EKG   Radiology DG Ankle Complete Right  Result Date: 12/01/2020 CLINICAL DATA:  Injury to the right ankle with pain. EXAM: RIGHT ANKLE - COMPLETE 3+ VIEW COMPARISON:  None. FINDINGS: There is no evidence of fracture, dislocation, or joint effusion. Soft tissues are unremarkable. IMPRESSION: No acute fracture or dislocation. Electronically Signed   By: Abelardo Diesel M.D.   On: 12/01/2020 14:27    Procedures Procedures (including critical care time)  Medications Ordered in UC Medications - No data to display  Initial Impression / Assessment and Plan / UC Course  I have reviewed the triage vital signs and the nursing notes. Has L ankle sprain. He was placed on an ankle brace. His daughter who is his care giver advised to have him wear his walker boot on the fractured L foot til he FU ortho, and since he cant use crutches, needs to call his PCP to get Rx for a wheelchair. See instructions.  Pertinent  imaging results that were available during my care of the patient were  reviewed by me and considered in my medical decision making (see chart for details).  Final Clinical Impressions(s) / UC Diagnoses   Final diagnoses:  Moderate right ankle sprain, initial encounter     Discharge Instructions     Ice area of pain for 15-20 min 3-4 times a day and elevate for 48h, then you may stop the ice after that. But continue elevating it. He may follow up with the orthopedist for his L ankle fracture.   I prescribed an anti-inflammatory cream to help with pain of R ankle.     ED Prescriptions    None     PDMP not reviewed this encounter.   Shelby Mattocks, PA-C 12/01/20 2322

## 2020-12-03 ENCOUNTER — Encounter (INDEPENDENT_AMBULATORY_CARE_PROVIDER_SITE_OTHER): Payer: Self-pay | Admitting: Internal Medicine

## 2020-12-03 ENCOUNTER — Telehealth (INDEPENDENT_AMBULATORY_CARE_PROVIDER_SITE_OTHER): Payer: Medicare Other | Admitting: Nurse Practitioner

## 2020-12-03 ENCOUNTER — Encounter (HOSPITAL_COMMUNITY): Payer: Self-pay | Admitting: Emergency Medicine

## 2020-12-03 ENCOUNTER — Telehealth (INDEPENDENT_AMBULATORY_CARE_PROVIDER_SITE_OTHER): Payer: Self-pay | Admitting: Nurse Practitioner

## 2020-12-03 ENCOUNTER — Other Ambulatory Visit: Payer: Self-pay

## 2020-12-03 ENCOUNTER — Encounter (INDEPENDENT_AMBULATORY_CARE_PROVIDER_SITE_OTHER): Payer: Self-pay | Admitting: Nurse Practitioner

## 2020-12-03 ENCOUNTER — Emergency Department (HOSPITAL_COMMUNITY): Payer: Medicare Other

## 2020-12-03 ENCOUNTER — Emergency Department (HOSPITAL_COMMUNITY)
Admission: EM | Admit: 2020-12-03 | Discharge: 2020-12-04 | Disposition: A | Discharge status: Home / Self Care | Payer: Medicare Other | Attending: Emergency Medicine | Admitting: Emergency Medicine

## 2020-12-03 DIAGNOSIS — M25571 Pain in right ankle and joints of right foot: Secondary | ICD-10-CM

## 2020-12-03 DIAGNOSIS — I10 Essential (primary) hypertension: Secondary | ICD-10-CM | POA: Diagnosis not present

## 2020-12-03 DIAGNOSIS — Z20822 Contact with and (suspected) exposure to covid-19: Secondary | ICD-10-CM | POA: Insufficient documentation

## 2020-12-03 DIAGNOSIS — S99911A Unspecified injury of right ankle, initial encounter: Secondary | ICD-10-CM | POA: Diagnosis present

## 2020-12-03 DIAGNOSIS — Z87891 Personal history of nicotine dependence: Secondary | ICD-10-CM | POA: Insufficient documentation

## 2020-12-03 DIAGNOSIS — S80821A Blister (nonthermal), right lower leg, initial encounter: Secondary | ICD-10-CM | POA: Diagnosis not present

## 2020-12-03 DIAGNOSIS — R609 Edema, unspecified: Secondary | ICD-10-CM | POA: Diagnosis not present

## 2020-12-03 DIAGNOSIS — Z91040 Latex allergy status: Secondary | ICD-10-CM | POA: Insufficient documentation

## 2020-12-03 DIAGNOSIS — E119 Type 2 diabetes mellitus without complications: Secondary | ICD-10-CM | POA: Diagnosis not present

## 2020-12-03 DIAGNOSIS — Z8673 Personal history of transient ischemic attack (TIA), and cerebral infarction without residual deficits: Secondary | ICD-10-CM | POA: Diagnosis not present

## 2020-12-03 DIAGNOSIS — W010XXA Fall on same level from slipping, tripping and stumbling without subsequent striking against object, initial encounter: Secondary | ICD-10-CM | POA: Insufficient documentation

## 2020-12-03 DIAGNOSIS — Z79899 Other long term (current) drug therapy: Secondary | ICD-10-CM | POA: Insufficient documentation

## 2020-12-03 DIAGNOSIS — M7989 Other specified soft tissue disorders: Secondary | ICD-10-CM | POA: Diagnosis not present

## 2020-12-03 DIAGNOSIS — R52 Pain, unspecified: Secondary | ICD-10-CM | POA: Diagnosis not present

## 2020-12-03 DIAGNOSIS — Z8546 Personal history of malignant neoplasm of prostate: Secondary | ICD-10-CM | POA: Insufficient documentation

## 2020-12-03 DIAGNOSIS — W19XXXD Unspecified fall, subsequent encounter: Secondary | ICD-10-CM

## 2020-12-03 DIAGNOSIS — R5381 Other malaise: Secondary | ICD-10-CM | POA: Diagnosis not present

## 2020-12-03 DIAGNOSIS — Z96652 Presence of left artificial knee joint: Secondary | ICD-10-CM | POA: Diagnosis not present

## 2020-12-03 DIAGNOSIS — W19XXXA Unspecified fall, initial encounter: Secondary | ICD-10-CM

## 2020-12-03 MED ORDER — PREGABALIN 75 MG PO CAPS
75.0000 mg | ORAL_CAPSULE | Freq: Two times a day (BID) | ORAL | Status: DC
  Administered 2020-12-03 – 2020-12-04 (×2): 75 mg via ORAL
  Filled 2020-12-03 (×2): qty 1

## 2020-12-03 MED ORDER — BISMUTH SUBSALICYLATE 262 MG/15ML PO SUSP
30.0000 mL | Freq: Four times a day (QID) | ORAL | Status: DC | PRN
  Filled 2020-12-03: qty 118

## 2020-12-03 MED ORDER — FENTANYL CITRATE (PF) 100 MCG/2ML IJ SOLN
50.0000 ug | Freq: Once | INTRAMUSCULAR | Status: DC
  Filled 2020-12-03: qty 2

## 2020-12-03 MED ORDER — PANTOPRAZOLE SODIUM 20 MG PO TBEC
20.0000 mg | DELAYED_RELEASE_TABLET | Freq: Every day | ORAL | Status: DC
  Filled 2020-12-03 (×2): qty 1

## 2020-12-03 MED ORDER — TOPIRAMATE 25 MG PO TABS
50.0000 mg | ORAL_TABLET | Freq: Two times a day (BID) | ORAL | Status: DC
  Administered 2020-12-03 – 2020-12-04 (×2): 50 mg via ORAL
  Filled 2020-12-03 (×2): qty 2

## 2020-12-03 MED ORDER — CEPHALEXIN 500 MG PO CAPS
500.0000 mg | ORAL_CAPSULE | Freq: Once | ORAL | Status: AC
  Administered 2020-12-03: 500 mg via ORAL
  Filled 2020-12-03: qty 1

## 2020-12-03 MED ORDER — VENLAFAXINE HCL ER 37.5 MG PO CP24
37.5000 mg | ORAL_CAPSULE | Freq: Every morning | ORAL | Status: DC
  Administered 2020-12-04: 37.5 mg via ORAL
  Filled 2020-12-03: qty 1

## 2020-12-03 MED ORDER — VITAMIN D 25 MCG (1000 UNIT) PO TABS
5000.0000 [IU] | ORAL_TABLET | Freq: Every day | ORAL | Status: DC
  Administered 2020-12-03: 5000 [IU] via ORAL
  Filled 2020-12-03: qty 5

## 2020-12-03 MED ORDER — ESCITALOPRAM OXALATE 10 MG PO TABS
20.0000 mg | ORAL_TABLET | Freq: Every day | ORAL | Status: DC
  Administered 2020-12-03: 20 mg via ORAL
  Filled 2020-12-03: qty 2

## 2020-12-03 MED ORDER — MIDODRINE HCL 5 MG PO TABS: 10.0000 mg | ORAL_TABLET | Freq: Three times a day (TID) | ORAL | Status: DC

## 2020-12-03 MED ORDER — ZOLPIDEM TARTRATE 5 MG PO TABS: 5.0000 mg | ORAL_TABLET | Freq: Every day | ORAL | Status: DC

## 2020-12-03 MED ORDER — MIDODRINE HCL 5 MG PO TABS
10.0000 mg | ORAL_TABLET | Freq: Three times a day (TID) | ORAL | Status: DC
  Administered 2020-12-04: 10 mg via ORAL
  Filled 2020-12-03 (×6): qty 2

## 2020-12-03 MED ORDER — FLUTICASONE PROPIONATE 50 MCG/ACT NA SUSP
2.0000 | Freq: Every day | NASAL | Status: DC
  Administered 2020-12-04: 2 via NASAL
  Filled 2020-12-03 (×2): qty 16

## 2020-12-03 MED ORDER — HYDROCODONE-ACETAMINOPHEN 5-325 MG PO TABS: 1.0000 | ORAL_TABLET | ORAL | 0 refills | Status: DC | PRN

## 2020-12-03 MED ORDER — ZOLPIDEM TARTRATE 5 MG PO TABS: 5.0000 mg | ORAL_TABLET | Freq: Every evening | ORAL | Status: DC | PRN

## 2020-12-03 MED ORDER — VITAMIN B-12 1000 MCG PO TABS
1000.0000 ug | ORAL_TABLET | Freq: Every day | ORAL | Status: DC
  Administered 2020-12-04: 1000 ug via ORAL
  Filled 2020-12-03: qty 1

## 2020-12-03 MED ORDER — CEPHALEXIN 500 MG PO CAPS: 500.0000 mg | ORAL_CAPSULE | Freq: Four times a day (QID) | ORAL | 0 refills | Status: DC

## 2020-12-03 MED ORDER — FENTANYL CITRATE (PF) 100 MCG/2ML IJ SOLN
50.0000 ug | Freq: Once | INTRAMUSCULAR | Status: AC
  Administered 2020-12-03: 50 ug via INTRAMUSCULAR

## 2020-12-03 MED ORDER — FINASTERIDE 5 MG PO TABS
5.0000 mg | ORAL_TABLET | Freq: Every day | ORAL | Status: DC
  Administered 2020-12-04: 5 mg via ORAL
  Filled 2020-12-03 (×2): qty 1

## 2020-12-03 MED ORDER — OXYCODONE-ACETAMINOPHEN 5-325 MG PO TABS
2.0000 | ORAL_TABLET | Freq: Once | ORAL | Status: AC
  Administered 2020-12-03: 2 via ORAL
  Filled 2020-12-03: qty 2

## 2020-12-03 MED ORDER — HYDROCODONE-ACETAMINOPHEN 5-325 MG PO TABS: 2.0000 | ORAL_TABLET | ORAL | 0 refills | Status: DC | PRN

## 2020-12-03 MED ORDER — ATORVASTATIN CALCIUM 40 MG PO TABS
40.0000 mg | ORAL_TABLET | Freq: Every day | ORAL | Status: DC
  Administered 2020-12-04: 40 mg via ORAL
  Filled 2020-12-03: qty 1

## 2020-12-03 MED ORDER — ALFUZOSIN HCL ER 10 MG PO TB24
10.0000 mg | ORAL_TABLET | Freq: Every evening | ORAL | Status: DC
  Filled 2020-12-03: qty 1

## 2020-12-03 MED ORDER — IBUPROFEN 400 MG PO TABS
600.0000 mg | ORAL_TABLET | Freq: Four times a day (QID) | ORAL | Status: DC | PRN
  Administered 2020-12-03: 600 mg via ORAL
  Filled 2020-12-03: qty 2

## 2020-12-03 NOTE — Evaluation (Signed)
Physical Therapy Evaluation Patient Details Name: Jared Bridges MRN: [id number] DOB: 1930/11/22 Today's Date: 12/03/2020   History of Present Illness  Patient is 87 yo male with recent history of unsupervised fall this AM when ambulating to bathroom at home. Patient history significant for previous fall with Right foot fracture currently under managment with use of tracture boot.  Xrays unrevealing for left foot/ankle injury per ER notes.  Patient and his daughter report increased difficulty in walking and performing self-care/ADL due to degree of deficits and limitations   Clinical Impression   Patient demonstrates new onset of decline in functional mobility with history of repeated falls and demonstrates unsteadiness on feet, difficulty in walking, and generalized weakness requiring increased caregiver assistance in all functional mobility and ADL.  Patient requiring moderate assistance in bed mobility and transfers with cues/instruction in safety/sequence with use of RW to facilitate safety in standing.  Patient continues to demonstrate high risk for falls and limited ability to ambulate and unable to attempt stair negotiation at this time due to deficits and limitations.  Patient would benefit from continued PT services to increase independence and safety with bed mobility, transfers, ambulation, and stair negotiation to reduce risk for falls and burden of care.    Follow Up Recommendations SNF    Equipment Recommendations  None recommended by PT    Recommendations for Other Services       Precautions / Restrictions Precautions Precautions: Fall Required Braces or Orthoses:  (Right fracture boot) Restrictions Weight Bearing Restrictions: Yes RLE Weight Bearing: Weight bearing as tolerated (no clear orders found in PMH, per patient and his daughter's report WBAT with fracture boot RLE)      Mobility  Bed Mobility Overal bed mobility: Needs Assistance Bed Mobility: Supine to  Sit;Sit to Supine     Supine to sit: Min assist Sit to supine: Mod assist        Transfers Overall transfer level: Needs assistance Equipment used: Rolling walker (2 wheeled) Transfers: Sit to/from [employer] Sit to Stand: Mod assist Stand pivot transfers: Mod assist       General transfer comment:  (difficulty with RLE weight acceptance. Patient instructed in technique and sequence in use of RW to improve safety with stand-pivot using RW)  Ambulation/Gait Ambulation/Gait assistance: Min assist Gait Distance (Feet): 5 Feet Assistive device: Rolling walker (2 wheeled) Gait Pattern/deviations: Step-to pattern;Decreased stance time - right;Antalgic     General Gait Details: difficulty with foot clearance due to pain/weakness  Stairs Stairs:  (unable to attempt)          Wheelchair Mobility    Modified Rankin (Stroke Patients Only)       Balance Overall balance assessment: History of Falls                                           Pertinent Vitals/Pain Pain Assessment: 0-10 Pain Score: 6  Pain Location: RLE Pain Descriptors / Indicators: Sharp Pain Intervention(s): Limited activity within patient's tolerance;Repositioned    Home Living Family/patient expects to be discharged to:: Skilled nursing facility                 Additional Comments: Patient's home environment requires extensive stair negotiation to enter home and to access primary bedroom/bathroom on 2nd floor of house    Prior Function Level of Independence: Independent with assistive device(s)  Comments: patient reports use of Rollator, RW, SPC     Hand Dominance        Extremity/Trunk Assessment   Upper Extremity Assessment Upper Extremity Assessment: Generalized weakness    Lower Extremity Assessment Lower Extremity Assessment: Generalized weakness       Communication   Communication: No difficulties  Cognition  Arousal/Alertness: Awake/alert Behavior During Therapy: WFL for tasks assessed/performed                                          General Comments      Exercises     Assessment/Plan    PT Assessment Patient needs continued PT services  PT Problem List Decreased strength;Decreased activity tolerance;Decreased balance;Decreased mobility;Decreased knowledge of use of DME;Decreased safety awareness;Pain       PT Treatment Interventions DME instruction;Gait training;Stair training;Functional mobility training;Therapeutic activities;Therapeutic exercise;Balance training;Patient/family education;Wheelchair mobility training;Manual techniques    PT Goals (Current goals can be found in the Care Plan section)  Acute Rehab PT Goals Patient Stated Goal: Return home when able PT Goal Formulation: With patient/family Time For Goal Achievement: 12/10/20 Potential to Achieve Goals: Good    Frequency Min 3X/week   Barriers to discharge Inaccessible home environment patient has 4-5 steps to enter home and 20 steps to access primary bedroom/bathroom on 2nd floor of home    Co-evaluation               AM-PAC PT "6 Clicks" Mobility  Outcome Measure Help needed turning from your back to your side while in a flat bed without using bedrails?: A Little Help needed moving from lying on your back to sitting on the side of a flat bed without using bedrails?: A Lot Help needed moving to and from a bed to a chair (including a wheelchair)?: A Lot Help needed standing up from a chair using your arms (e.g., wheelchair or bedside chair)?: A Lot Help needed to walk in hospital room?: A Lot Help needed climbing 3-5 steps with a railing? : Total 6 Click Score: 12    End of Session Equipment Utilized During Treatment:  (right fracture boot) Activity Tolerance: Patient limited by fatigue;Patient limited by pain Patient left: in bed;with family/visitor present;with call bell/phone  within reach   PT Visit Diagnosis: Unsteadiness on feet (R26.81);Repeated falls (R29.6);Muscle weakness (generalized) (M62.81);Difficulty in walking, not elsewhere classified (R26.2)    Time: 1428-1500 PT Time Calculation (min) (ACUTE ONLY): 32 min   Charges:   PT Evaluation $PT Eval Low Complexity: 1 Low PT Treatments $Therapeutic Activity: 8-22 mins       3:17 PM, 12/03/20 M. Shary Decamp, PT, DPT Physical Therapist- [place] Office Number: [phone]

## 2020-12-03 NOTE — ED Provider Notes (Signed)
[facility] EMERGENCY DEPARTMENT Provider Note   CSN: [id number] Arrival date & time: 12/03/20  1208     History Chief Complaint  Patient presents with  . Fall    Jared Bridges is a 87 y.o. male.  HPI 87 year old male with a history of CKD, depression, diverticulitis, orthostatic hypotension, PUD, DM type II, TIA presents to the ER with complaints of right ankle pain after a fall.  Patient states that he was seen here approximately a month ago and diagnosed with a left ankle fracture after a fall.  States he has been wearing his ankle brace and notes improvement in his pain in the left ankle.  He has not followed up with orthopedics.  He states that he fell on January 1, was seen in urgent care and had an x-ray done which did not show any fractures.  He states that he again fell yesterday just due to the loss of balance and felt like he reinjured his right ankle.  He reports severe pain and "lightning bolts "shooting through his foot.  Has not been able to bear weight.  He also noticed some blisters at developed on the medial side of his shin.  Denies any numbness or tingling.  He maintains that he did not fall or hit his head.  Denies any unilateral weakness, dizziness. He is not on any anticoagulants    Past Medical History:  Diagnosis Date  . Anemia, iron deficiency   . Chronic headaches    Migraines  . Chronic kidney disease    Resolved  . Depression   . Diverticulitis   . Essential hypertension   . GI bleed    Dr. Laural Golden - 1998  . Hypercholesteremia   . Orthostatic hypotension   . Peptic ulcer disease   . Peripheral neuropathy   . Polymyalgia rheumatica ([place])   . Stroke [facility])    Residual trouble reading and writing  . Syncope   . Thyroid nodule   . TIA (transient ischemic attack) 12/2017  . Type 2 diabetes mellitus [facility])     Patient Active Problem List   Diagnosis Date Noted  . Prostate cancer ([place]) 06/20/2020  . Elevated PSA 04/18/2020  . Nocturia 04/18/2020  .  Medicare annual wellness visit, subsequent 12/07/2019  . Depressed mood 12/07/2019  . Vitamin D deficiency 10/25/2019  . Memory loss 10/25/2019  . Fall 10/25/2019  . Migraine headaches 12/29/2017  . TIA (transient ischemic attack) 12/28/2017  . Rectal bleeding 11/26/2017  . Acute cholecystitis 04/05/2016  . 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 Total knee replacement status 02/28/2016  . CVA (cerebral infarction) 12/19/2015  . Acute CVA (cerebrovascular accident) ([place]) 12/19/2015  . Carotid stenosis 12/19/2015  . Postoperative wound dehiscence 07/09/2015  . Type 2 diabetes mellitus, uncontrolled ([place]) 06/29/2015  . Cellulitis of knee, left 06/29/2015  . Wound infection after surgery 06/29/2015  . OA (osteoarthritis) of knee 05/28/2015  . BRBPR (bright red blood per rectum) 11/21/2014  . Orthostatic hypotension 11/21/2014  . Dizziness 11/16/2014  . Polymyalgia rheumatica ([place]) 09/12/2014  . Anemia, iron deficiency 08/16/2014  . DJD (degenerative joint disease) 08/16/2014  . Essential hypertension   . Type 2 diabetes mellitus ([place])   . Cataract, nuclear 04/11/2014  . Complaining of back-related symptom 03/15/2014  . Arthralgia of hip or thigh 03/15/2014  . Gonalgia 10/10/2013  . 1st degree AV block 08/04/2013  . Infection of the upper respiratory tract 04/05/2013  . Cataract 02/24/2013  . Pseudoaphakia 02/24/2013  .  Episode of syncope 01/11/2013  . Cardiac murmur 11/09/2012  . Near syncope 11/09/2012  . Anxiety 06/18/2012  . Clinical depression 06/18/2012  . Bleeding gastrointestinal 06/18/2012  . Diverticulitis 06/18/2012  . Difficulty hearing 06/18/2012  . Adaptive colitis 06/18/2012  . Arthritis, degenerative 06/18/2012  . Disease of thyroid gland 06/18/2012  . Multinodular goiter 01/29/2012  . 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 Adenoma of large intestine 05/31/2011  . Cephalalgia 05/31/2011  . Hypercholesterolemia 05/31/2011  . Benign prostatic  hyperplasia with urinary obstruction 05/31/2011  . LBP (low back pain) 05/31/2011  . Diabetes mellitus ([name] 05/31/2011  . Essential (primary) hypertension 05/31/2011    Past Surgical History:  Procedure Laterality Date  . APPENDECTOMY     as child  . BACK SURGERY     2000 IDET SPINAL PROC  . CHOLECYSTECTOMY N/A 04/07/2016   Procedure: CHOLECYSTECTOMY;  Surgeon: Franky Macho, MD;  Location: [facility] ORS;  Service: General;  Laterality: N/A;  . COLON SURGERY     FOR DIVERTICULOSIS   1988  . COLONOSCOPY N/A 01/07/2018   Procedure: COLONOSCOPY;  Surgeon: Malissa Hippo, MD;  Location: [facility] ENDO SUITE;  Service: Endoscopy;  Laterality: N/A;  1:00  . CRYOTHERAPY    . EYE SURGERY     right cataract with lens implant  . FEMUR SURGERY     RT LEG        1953  . HAND SURGERY     RIGHT  . HERNIA REPAIR     1-right inguinal, 3- left inguinal  . IRRIGATION AND DEBRIDEMENT KNEE Left 07/09/2015   Procedure: LEFT KNEE IRRIGATION AND DEBRIDEMENT WOUND CLOSURE;  Surgeon: Ollen Gross, MD;  Location: [facility] ORS;  Service: Orthopedics;  Laterality: Left;  . JOINT REPLACEMENT  02/28/2016   lt knee revision  . KNEE DEBRIDEMENT     2016   LEFT  . MENISCUS REPAIR     LEFT  . POLYPECTOMY  01/07/2018   Procedure: POLYPECTOMY;  Surgeon: Malissa Hippo, MD;  Location: [facility] ENDO SUITE;  Service: Endoscopy;;  colon   . SHOULDER OPEN ROTATOR CUFF REPAIR     RT SHOULDER  . SPINAL FUSION    . SPINAL FUSION    . TONSILLECTOMY    . TOTAL KNEE ARTHROPLASTY Left 05/28/2015   Procedure: LEFT TOTAL KNEE ARTHROPLASTY;  Surgeon: Ollen Gross, MD;  Location: [facility] ORS;  Service: Orthopedics;  Laterality: Left;  . TOTAL KNEE REVISION Left 02/28/2016   Procedure: LEFT TOTAL KNEE REVISION;  Surgeon: Tarry Kos, MD;  Location: [facility] OR;  Service: Orthopedics;  Laterality: Left;  . TRIGGER FINGER RELEASE     RT HAND   1990S  . UVULECTOMY  2004   TURBINATE,TONSIL,ADENOID       Family History  Problem Relation Age of Onset  .  Colon cancer Sister   . Hypertension Other   . Depression Brother   . Depression Daughter     Social History   Tobacco Use  . Smoking status: Former Smoker    Packs/day: 0.10    Years: 18.00    Pack years: 1.80    Types: Cigarettes    Start date: 12/18/1958    Quit date: 12/01/1986    Years since quitting: 34.0  . Smokeless tobacco: Never Used  Vaping Use  . Vaping Use: Never used  Substance Use Topics  . Alcohol use: No    Alcohol/week: 0.0 standard drinks  . Drug use: No    Home Medications Prior to Admission medications  Medication Sig Start Date End Date Taking? Authorizing Provider  alfuzosin (UROXATRAL) 10 MG 24 hr tablet Take 10 mg by mouth every evening. 11/15/20  Yes [provider]  Apremilast (OTEZLA) 10 & 20 & 30 MG TBPK Take 10-30 mg by mouth See admin instructions. Take 10 mg by mouth daily on day 1, take 10 mg by mouth twice daily on day 2, take 10 mg by mouth in the morning and take 20 mg by mouth in the evening on day 3, take 20 mg by mouth twice daily on day 4, take 20 mg by mouth in the morning and take 30 mg by mouth in the evening on day 5, take 30 mg by mouth twice daily on day 6, then continue 30 mg by mouth twice daily   Yes [provider]  atorvastatin (LIPITOR) 40 MG tablet TAKE 1 TABLET BY MOUTH DAILY 09/10/20  Yes Gosrani, Nimish C, MD  bismuth subsalicylate (PEPTO BISMOL) 262 MG/15ML suspension Take 30 mLs by mouth every 6 (six) hours as needed for diarrhea or loose stools.   Yes [provider]  cephALEXin (KEFLEX) 500 MG capsule Take 1 capsule (500 mg total) by mouth 4 (four) times daily for 5 days. 12/03/20 12/08/20 Yes Garald Balding, PA-C  Cholecalciferol (VITAMIN D3) 5000 units CAPS Take 5,000 Units by mouth at bedtime.   Yes [provider]  diclofenac sodium (VOLTAREN) 1 % GEL Apply 2 g topically daily as needed (for pain). 11/22/15  Yes Dohmeier, Asencion Partridge, MD  enzalutamide Gillermina Phy) 80 MG tablet Take 80 mg by mouth  daily. 11/05/20  Yes [provider]  escitalopram (LEXAPRO) 10 MG tablet Take 20 mg by mouth at bedtime. 01/31/16  Yes [provider]  finasteride (PROSCAR) 5 MG tablet Take 1 tablet (5 mg total) by mouth daily. 04/18/20  Yes McKenzie, Candee Furbish, MD  fluticasone (FLONASE) 50 MCG/ACT nasal spray Place 2 sprays into both nostrils daily. 04/19/19  Yes Wurst, [place], PA-C  ibuprofen (ADVIL) 600 MG tablet Take 600 mg by mouth every 6 (six) hours as needed. 08/03/20  Yes [provider]  midodrine (PROAMATINE) 10 MG tablet Take 10 mg by mouth 3 (three) times daily.    Yes [provider]  pantoprazole (PROTONIX) 20 MG tablet Take 1 tablet (20 mg total) by mouth daily. Due for yearly office visit 10/09/20  Yes Ezzard Standing, PA-C  pregabalin (LYRICA) 75 MG capsule Take 75 mg by mouth 2 (two) times daily.   Yes [provider]  topiramate (TOPAMAX) 50 MG tablet Take 50 mg by mouth 2 (two) times daily.   Yes [provider]  venlafaxine XR (EFFEXOR-XR) 37.5 MG 24 hr capsule TAKE ONE CAPSULE BY MOUTH EVERY MORNING 11/05/20  Yes Ailene Ards, NP  vitamin B-12 (CYANOCOBALAMIN) 500 MCG tablet Take 1,000 mcg by mouth daily.   Yes [provider]  zolpidem (AMBIEN) 5 MG tablet Take 5-10 mg by mouth at bedtime. 05/16/20  Yes [provider]  fluocinonide cream (LIDEX) [id number] % Apply 1 application topically daily as needed (for irritation).  Patient not taking: Reported on 12/03/2020    [provider]  HYDROcodone-acetaminophen (NORCO/VICODIN) 5-325 MG tablet Take 1 tablet by mouth every 4 (four) hours as needed for up to 3 days. 12/03/20 12/06/20  Garald Balding, PA-C    Allergies    Iodine, Calan [verapamil], Cymbalta [duloxetine hcl], Iodinated diagnostic agents, Latex, Lisinopril, Neurontin [gabapentin], Zoloft [sertraline hcl], Valium [diazepam], Flomax [tamsulosin hcl],  Glimepiride, Melatonin, and Tape  Review of Systems   Review of  Systems  Constitutional: Negative for fever.  Musculoskeletal: Positive for arthralgias, gait problem and joint swelling.  Skin: Positive for color change. Negative for wound.  Neurological: Negative for weakness, numbness and headaches.    Physical Exam Updated Vital Signs BP 121/65   Pulse 75   Temp 98.2 F (36.8 C) (Oral)   Resp 16   Ht 6\' 2"  (1.88 m)   Wt 90.7 kg   SpO2 98%   BMI 25.68 kg/m   Physical Exam Vitals and nursing note reviewed.  Constitutional:      Appearance: He is well-developed and well-nourished.  HENT:     Head: Normocephalic and atraumatic.  Eyes:     Conjunctiva/sclera: Conjunctivae normal.  Cardiovascular:     Rate and Rhythm: Normal rate and regular rhythm.     Heart sounds: No murmur heard.   Pulmonary:     Effort: Pulmonary effort is normal. No respiratory distress.     Breath sounds: Normal breath sounds.  Abdominal:     Palpations: Abdomen is soft.     Tenderness: There is no abdominal tenderness.  Musculoskeletal:        General: No edema.     Cervical back: Neck supple.     Comments: Right ankle and foot with significant edema and mild erythema.  DP pulses located with Doppler.  Limited range of motion secondary to pain.  Large blisters on the medial aspect of his right shin.  No significant warmth, fluctuance  Skin:    General: Skin is warm and dry.  Neurological:     Mental Status: He is alert.  Psychiatric:        Mood and Affect: Mood and affect normal.         ED Results / Procedures / Treatments   Labs (all labs ordered are listed, but only abnormal results are displayed) Labs Reviewed - No data to display  EKG None  Radiology DG Ankle Complete Right  Result Date: 12/03/2020 CLINICAL DATA:  Right foot and ankle pain. Status post fall yesterday morning. Initial encounter. EXAM: RIGHT ANKLE - COMPLETE 3+ VIEW COMPARISON:  Plain films right ankle 12/02/2020. FINDINGS: Soft tissues about the ankle are swollen. Well  corticated bone fragment between the medial malleolus and the talus is unchanged. No acute fracture is identified. The medial clear space of the ankle is widened. IMPRESSION: No acute fracture. Widening of the medial clear space of the ankle compatible with deltoid ligament injury. Soft tissue swelling about the ankle. No change in of well-corticated bone fragment off the tip of the medial malleolus. Electronically Signed   By: Inge Rise M.D.   On: 12/03/2020 13:32   DG Foot Complete Right  Result Date: 12/03/2020 CLINICAL DATA:  Right foot and ankle pain. Status post fall yesterday morning. Initial encounter. EXAM: RIGHT FOOT COMPLETE - 3+ VIEW COMPARISON:  None. FINDINGS: There is no evidence of fracture or dislocation. There is no evidence of arthropathy or other focal bone abnormality. Soft tissues are swollen. IMPRESSION: Soft tissue swelling without acute bony or joint abnormality. Electronically Signed   By: Inge Rise M.D.   On: 12/03/2020 13:33    Procedures Procedures (including critical care time)  Medications Ordered in ED Medications  fentaNYL (SUBLIMAZE) injection 50 mcg (50 mcg Intramuscular Given 12/03/20 1250)  cephALEXin (KEFLEX) capsule 500 mg (500 mg Oral Given 12/03/20 1626)    ED Course  I have reviewed  the triage vital signs and the nursing notes.  Pertinent labs & imaging results that were available during my care of the patient were reviewed by me and considered in my medical decision making (see chart for details).    MDM Rules/Calculators/A&P                          87 year old male with right ankle pain after a fall.  Daughter arrived to the ER, and states that the patient did not have a second fall yesterday.  States that this happened on the first.  Vitals overall reassuring.  Neurovascularly intact.  Repeat x-rays with no evidence of fractures, though there was some widening of the medial clear space of the ankle compatible with deltoid ligament  injury.  Patient will be placed in a cam walker, will start on some antibiotics to prevent infection.  Will send several pills of Norco for pain control.    Patient's daughter expressed concerns with patient going home as he has not been able to ambulate at all.  Will have PT eval here, reached out to transition of care team to help facilitate transition back to home.  Physical therapy saw and evaluated the patient. Recommends SNF placement. Social work also evaluated the patient, initiated paperwork. Patient having significant difficulty ambulating due to injuries to both ankles. Daughter states home environment is not home for him and there is a high risk of fall. Plan for transfer to facility tomorrow. Patient will remain in the ED until then. Ordered dose of Keflex.  Discussed case with Dr. Roderic Palau who saw and evaluated the patient, as well as Dr. Regenia Skeeter who is agreeable to the above plan and disposition.  Final Clinical Impression(s) / ED Diagnoses Final diagnoses:  Fall, initial encounter    Rx / DC Orders ED Discharge Orders         Ordered    Ambulatory referral to Orthopedic Surgery        12/03/20 1340    cephALEXin (KEFLEX) 500 MG capsule  4 times daily        12/03/20 1353    HYDROcodone-acetaminophen (NORCO/VICODIN) 5-325 MG tablet  Every 4 hours PRN,   Status:  Discontinued        12/03/20 1402    HYDROcodone-acetaminophen (NORCO/VICODIN) 5-325 MG tablet  Every 4 hours PRN        12/03/20 1403           Lyndel Safe 12/03/20 1633    Milton Ferguson, MD 12/06/20 1025

## 2020-12-03 NOTE — Plan of Care (Signed)
  Problem: Acute Rehab PT Goals(only PT should resolve) Goal: Pt Will Go Supine/Side To Sit Outcome: Progressing Flowsheets (Taken 12/03/2020 1520) Pt will go Supine/Side to Sit: with supervision Goal: Pt Will Go Sit To Supine/Side Outcome: Progressing Flowsheets (Taken 12/03/2020 1520) Pt will go Sit to Supine/Side: with supervision Goal: Patient Will Transfer Sit To/From Stand Outcome: Progressing Flowsheets (Taken 12/03/2020 1520) Patient will transfer sit to/from stand: with min guard assist Note: Using RW Goal: Pt Will Transfer Bed To Chair/Chair To Bed Outcome: Progressing Flowsheets (Taken 12/03/2020 1520) Pt will Transfer Bed to Chair/Chair to Bed: min guard assist Note: With RW Goal: Pt Will Ambulate Outcome: Progressing Flowsheets (Taken 12/03/2020 1520) Pt will Ambulate:  50 feet  with min guard assist  with rolling walker Goal: Pt Will Go Up/Down Stairs Outcome: Progressing Flowsheets (Taken 12/03/2020 1520) Pt will Go Up / Down Stairs:  3-5 stairs  with min guard assist   3:21 PM, 12/03/20 M. Shary Decamp, PT, DPT Physical Therapist- [place] Office Number: [phone]

## 2020-12-03 NOTE — TOC Initial Note (Addendum)
Transition of Care [facility]) - Initial/Assessment Note    Patient Details  Name: Jared Bridges MRN: [id number] Date of Birth: 1930-09-24  Transition of [facility] CM/SW Contact:    Iona Beard, [place] Phone Number: 12/03/2020, 4:23 PM  Clinical Narrative:                 Pt presents to the ED due to a fall. TOC consulted for possible SNF placement. CSW spoke with pt and pts daughter [name] in ED. CSW informed pt and [name] that PT is recommending SNF. CSW explained SNF referral process to pt and [name]. Pt is agreeable to SNF referral being sent out to [facility]. Pt lives with his daughter and wife, pt is able to complete ADLs independently however, due to fall he needs assistance. Pts daughter and wife provide assistance when needed. Pt can drive but most often [name] will provide pts transportation. Pt has used [facility] in the past. Pt has a rollator, cane, walker, and a shower bench. Pt has been COVID vaccinated and boosted and can provide card if needed.   CSW completed pts Fl2. Pts PASRR is  [id number]. CSW sent pts referral to [facility]. TOC to follow.   Addendum: Per [place] the system was down and they are unable to start auth. [facility] asked that clinicals for pt be faxed over and will need to call tmrw to start auth. TOC to follow.   Expected Discharge Plan: Skilled Nursing Facility Barriers to Discharge: Continued Medical Work up,ED SNF auth   Patient Goals and CMS Choice Patient states their goals for this hospitalization and ongoing recovery are:: Got to rehab CMS Medicare.gov Compare Post Acute Care list provided to:: Patient Represenative (must comment) Deirdre Pippins) Choice offered to / list presented to : Adult Children  Expected Discharge Plan and Services Expected Discharge Plan: [place] In-house Referral: Clinical Social Work Discharge Planning Services: CM Consult Post Acute Care Choice: [place] arrangements for the past  2 months: Single Family Home                 DME Arranged: N/A DME Agency: NA       HH Arranged: NA [place] Agency: NA        Prior Living Arrangements/Services Living arrangements for the past 2 months: [place] Lives with:: Adult Children,Spouse Patient language and need for interpreter reviewed:: Yes Do you feel safe going back to the place where you live?: Yes      Need for Family Participation in Patient Care: No (Comment) Care giver support system in place?: Yes (comment) Current home services: DME (Rollator, cane, walker, wheelchair) Criminal Activity/Legal Involvement Pertinent to Current Situation/Hospitalization: No - Comment as needed  Activities of Daily Living      Permission Sought/Granted Permission sought to share information with : Facility Art therapist granted to share information with : Yes, Verbal Permission Granted  Share Information with NAME: Oluw[place]iwa Kassman     Permission granted to share info w Relationship: Daughter  Permission granted to share info w Contact Information: [phone]  Emotional Assessment Appearance:: Appears stated age Attitude/Demeanor/Rapport: Engaged Affect (typically observed): Accepting Orientation: : Oriented to Self,Oriented to Place,Oriented to  Time,Oriented to Situation Alcohol / Substance Use: Not Applicable Psych Involvement: No (comment)  Admission diagnosis:  foot pain Patient Active Problem List   Diagnosis Date Noted  . Prostate cancer ([place]) 06/20/2020  . Elevated PSA 04/18/2020  . Nocturia 04/18/2020  .  Medicare annual wellness visit, subsequent 12/07/2019  . Depressed mood 12/07/2019  . Vitamin D deficiency 10/25/2019  . Memory loss 10/25/2019  . Fall 10/25/2019  . Migraine headaches 12/29/2017  . TIA (transient ischemic attack) 12/28/2017  . Rectal bleeding 11/26/2017  . Acute cholecystitis 04/05/2016  . 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 Total knee  replacement status 02/28/2016  . CVA (cerebral infarction) 12/19/2015  . Acute CVA (cerebrovascular accident) ([name] 12/19/2015  . Carotid stenosis 12/19/2015  . Postoperative wound dehiscence 07/09/2015  . Type 2 diabetes mellitus, uncontrolled ([name] 06/29/2015  . Cellulitis of knee, left 06/29/2015  . Wound infection after surgery 06/29/2015  . OA (osteoarthritis) of knee 05/28/2015  . BRBPR (bright red blood per rectum) 11/21/2014  . Orthostatic hypotension 11/21/2014  . Dizziness 11/16/2014  . Polymyalgia rheumatica ([name] 09/12/2014  . Anemia, iron deficiency 08/16/2014  . DJD (degenerative joint disease) 08/16/2014  . Essential hypertension   . Type 2 diabetes mellitus ([name]   . Cataract, nuclear 04/11/2014  . Complaining of back-related symptom 03/15/2014  . Arthralgia of hip or thigh 03/15/2014  . Gonalgia 10/10/2013  . 1st degree AV block 08/04/2013  . Infection of the upper respiratory tract 04/05/2013  . Cataract 02/24/2013  . Pseudoaphakia 02/24/2013  . Episode of syncope 01/11/2013  . Cardiac murmur 11/09/2012  . Near syncope 11/09/2012  . Anxiety 06/18/2012  . Clinical depression 06/18/2012  . Bleeding gastrointestinal 06/18/2012  . Diverticulitis 06/18/2012  . Difficulty hearing 06/18/2012  . Adaptive colitis 06/18/2012  . Arthritis, degenerative 06/18/2012  . Disease of thyroid gland 06/18/2012  . Multinodular goiter 01/29/2012  . 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 Adenoma of large intestine 05/31/2011  . Cephalalgia 05/31/2011  . Hypercholesterolemia 05/31/2011  . Benign prostatic hyperplasia with urinary obstruction 05/31/2011  . LBP (low back pain) 05/31/2011  . Diabetes mellitus ([name] 05/31/2011  . Essential (primary) hypertension 05/31/2011   PCP:  Wilson Singer, MD Pharmacy:   [facility] - [place], [place] - [address] [place] [address] Phone: [phone] Fax: [phone]     Social Determinants of Health (SDOH)  Interventions    Readmission Risk Interventions No flowsheet data found.

## 2020-12-03 NOTE — ED Triage Notes (Signed)
Pt states he fell 12/2 and fractured his left foot. Pt has brace to left foot. Pt states he also fell yesterday morning denies hitting head or loc. Pt c/o right foot/ankle pain and blisters noted to right inside of lower leg.

## 2020-12-03 NOTE — ED Notes (Signed)
Pt washed and dressed in clean clothes. Pt shaved per request Pt given peanut butter and crackers, gingerale and water

## 2020-12-03 NOTE — Telephone Encounter (Signed)
Please review most recent office visit note when you are able to. Daughter is very concerned about his/her ability to care for himself and complete ADLs due to his ankle pain. He is going to the ER today. My plan was that if they do not keep him/help him find placement as he is unable to care for himself then I will call adult protective services. Daughter tells me she will let me know outcome of the ER visit.

## 2020-12-03 NOTE — Discharge Instructions (Addendum)
Your x-ray showed possible damage to your deltoid ligament in your ankle, however no fractures or dislocations.  Please wear the boot, you may take this off at night to sleep.  Please take the antibiotic until finished to prevent any infection.  It is extremely important that you follow-up with the orthopedic doctor.  I have placed a referral for you, and also provided the contact information for them.  Return to the ER for any new or worsening symptoms.

## 2020-12-03 NOTE — Progress Notes (Signed)
Due to national recommendations of social distancing related to the [place] pandemic, an audio-only tele-health visit was felt to be the most appropriate encounter type for this patient today. I connected with  [name] on 12/03/20 utilizing audio-only technology and verified that I am speaking with the correct person using two identifiers. The patient was located at their home, and I was located at the office of [facility] during the encounter. I discussed the limitations of evaluation and management by telemedicine. The patient expressed understanding and agreed to proceed.    Subjective:  Patient ID: Jared Bridges, male    DOB: 1930-01-14  Age: 87 y.o. MRN: [id number]  CC:  Chief Complaint  Patient presents with  . Fall    Recent fall, fracture on left ankle, sprain of right ankle, daughter states that ankles are swelling and one has some red blood blisters coming up and will send a picture in MyChart      HPI  Virtual visit completed today for patient with assistance from patient's daughter.  She tells me the patient fell back in December and this resulted in a left ankle fracture.  On 12/01/2020 the patient fell around 11:00 in the morning in the hallway.  He was complaining of pain to his right ankle.  It was witnessed by the patient's wife.  The daughter was home did not witness the fall, however she helped him pull himself up and he ended up being taken to urgent care.  At that time x-ray was completed and it did not show fracture of the right ankle.  Today, the daughter tells me that his right ankle is still quite swollen has now developed blisters, and the patient is not able to bear any weight on his right or left foot.  She is very concerned because he is bedbound and she is not sure she is able to help him care for all of his ADLs due to his size.  He is very resistant regarding wanting to work with physical therapy or go to a nursing facility.  The daughter did  send a picture of the patient's ankle through his MyChart in preparation for today's visit.     Past Medical History:  Diagnosis Date  . Anemia, iron deficiency   . Chronic headaches    Migraines  . Chronic kidney disease    Resolved  . Depression   . Diverticulitis   . Essential hypertension   . GI bleed    Dr. Laural Golden - 1998  . Hypercholesteremia   . Orthostatic hypotension   . Peptic ulcer disease   . Peripheral neuropathy   . Polymyalgia rheumatica ([place])   . Stroke [facility])    Residual trouble reading and writing  . Syncope   . Thyroid nodule   . TIA (transient ischemic attack) 12/2017  . Type 2 diabetes mellitus ([name]       Family History  Problem Relation Age of Onset  . Colon cancer Sister   . Hypertension Other   . Depression Brother   . 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   Social History   Tobacco Use  . Smoking status: Former Smoker    Packs/day: 0.10    Years: 18.00    Pack years: 1.80    Types: Cigarettes    Start date: 12/18/1958    Quit date: 12/01/1986    Years since  quitting: 34.0  . Smokeless tobacco: Never Used  Substance Use Topics  . Alcohol use: No    Alcohol/week: 0.0 standard drinks     Current Meds  Medication Sig  . Apremilast (OTEZLA) 10 & 20 & 30 MG TBPK Take 10-30 mg by mouth See admin instructions. Take 10 mg by mouth daily on day 1, take 10 mg by mouth twice daily on day 2, take 10 mg by mouth in the morning and take 20 mg by mouth in the evening on day 3, take 20 mg by mouth twice daily on day 4, take 20 mg by mouth in the morning and take 30 mg by mouth in the evening on day 5, take 30 mg by mouth twice daily on day 6, then continue 30 mg by mouth twice daily  . atorvastatin (LIPITOR) 40 MG tablet TAKE 1 TABLET BY MOUTH DAILY  . bismuth subsalicylate (PEPTO BISMOL) 262 MG/15ML suspension Take 30 mLs by mouth every 6 (six) hours as needed for  diarrhea or loose stools.  . Cholecalciferol (VITAMIN D3) 5000 units CAPS Take 5,000 Units by mouth at bedtime.  . diclofenac sodium (VOLTAREN) 1 % GEL Apply 2 g topically daily as needed (for pain).  . enzalutamide (XTANDI) 80 MG tablet Take by mouth.  . escitalopram (LEXAPRO) 20 MG tablet escitalopram 20 mg tablet  TAKE 1 TABLET BY MOUTH EVERY DAY  . finasteride (PROSCAR) 5 MG tablet Take 1 tablet (5 mg total) by mouth daily.  . fluocinonide cream (LIDEX) 0.05 % Apply 1 application topically daily as needed (for irritation).   . fluticasone (FLONASE) 50 MCG/ACT nasal spray Place 2 sprays into both nostrils daily.  [name] Kitchen HYDROcodone-acetaminophen (NORCO/VICODIN) 5-325 MG tablet Take 1 tablet by mouth every 8 (eight) hours as needed.  . hydrocortisone 2.5 % cream Apply 1 application topically 2 (two) times daily as needed (for psoriasis).  [name] Kitchen ibuprofen (ADVIL) 600 MG tablet Take by mouth.  . midodrine (PROAMATINE) 10 MG tablet Take 10 mg by mouth 3 (three) times daily.   . pantoprazole (PROTONIX) 20 MG tablet Take 1 tablet (20 mg total) by mouth daily. Due for yearly office visit  . pregabalin (LYRICA) 75 MG capsule pregabalin 75 mg capsule  TAKE 1 CAPSULE BY MOUTH TWICE DAILY  . topiramate (TOPAMAX) 50 MG tablet topiramate 50 mg tablet  TAKE 1 TABLET BY MOUTH TWICE DAILY  . venlafaxine XR (EFFEXOR-XR) 37.5 MG 24 hr capsule TAKE ONE CAPSULE BY MOUTH EVERY MORNING  . vitamin B-12 (CYANOCOBALAMIN) 500 MCG tablet Take 1,000 mcg by mouth daily.  [name] Kitchen zolpidem (AMBIEN) 5 MG tablet Take 5-10 mg by mouth at bedtime.    ROS:  Review of Systems  Cardiovascular: Positive for leg swelling.  Musculoskeletal: Positive for joint pain.  Skin: Positive for rash (blisters to right lower extremity).     Objective:   Today's Vitals: There were no vitals taken for this visit. Vitals with BMI 12/01/2020 11/12/2020 11/01/2020  Height - 6\' 2"  -  Weight - 207 lbs 6 oz -  BMI - 26.62 -  Systolic 103 122   Diastolic 67 68 72  Pulse 68 36 77  Some encounter information is confidential and restricted. Go to Review Flowsheets activity to see all data.     Physical Exam Comprehensive physical exam not conducted today as office visit was conducted remotely.  Patient was alert over the phone and did answer questions appropriately.  I did attempt to walk the daughter through checking vascular  status in the patient's right foot and ankle, she was unable to feel a pulse.  Picture does show multiple blisters to patient's lower extremity, significant bruising noted to the dorsal aspect of the patient's right foot.  Unable to determine if shadow, bruising, or color change noted to patient's toes however based on picture.      Assessment and Plan   1. Fall, subsequent encounter   2. Acute right ankle pain      Plan: 1.,  2.  I recommended the patient be brought to the hospital via 911 as the patient is not ambulatory at this time and foot appears to be much more swollen and has now formed blisters.  Not sure of vascular status as daughter was unable to find pulse. Patient did report pain to his foot an ankle during visit.  In addition I am concerned about patient's ability to perform his ADLs and I do think he most likely needs to be in a rehab facility at least for short period of time while his left ankle heals and right ankle heals.  I did tell the daughter that if the hospital does not assist him in this to please let us know via MyChart message or calling us at which point we will have to call Adult Protective Services to assist.  She tells me she understands.  She tells me she will call 911 to have him transported to the hospital once the visit has ended.   Tests ordered No orders of the defined types were placed in this encounter.     No orders of the defined types were placed in this encounter.   Patient to follow-up as scheduled in July or sooner as needed. Telephone conversation lasted  for 26 minutes.  Ailene Ards, NP

## 2020-12-03 NOTE — NC FL2 (Signed)
[place] LEVEL OF CARE SCREENING TOOL     IDENTIFICATION  Patient Name: Jared Bridges Birthdate: [birth year] Sex: male Admission Date (Current Location): 12/03/2020  [facility] and [place] Number:  [employer] and Address:         Provider Number: [id number]  Attending Physician Name and Address:  Milton Ferguson, MD  Relative Name and Phone Number:  Jamer Schoonover [phone]    Current Level of Care: Hospital Recommended Level of Care: [place] Prior Approval Number:    Date Approved/Denied:   PASRR Number: [id number]  Discharge Plan: SNF    Current Diagnoses: Patient Active Problem List   Diagnosis Date Noted  . Prostate cancer ([place]) 06/20/2020  . Elevated PSA 04/18/2020  . Nocturia 04/18/2020  . Medicare annual wellness visit, subsequent 12/07/2019  . Depressed mood 12/07/2019  . Vitamin D deficiency 10/25/2019  . Memory loss 10/25/2019  . Fall 10/25/2019  . Migraine headaches 12/29/2017  . TIA (transient ischemic attack) 12/28/2017  . Rectal bleeding 11/26/2017  . Acute cholecystitis 04/05/2016  . 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 Total knee replacement status 02/28/2016  . CVA (cerebral infarction) 12/19/2015  . Acute CVA (cerebrovascular accident) ([place]) 12/19/2015  . Carotid stenosis 12/19/2015  . Postoperative wound dehiscence 07/09/2015  . Type 2 diabetes mellitus, uncontrolled ([place]) 06/29/2015  . Cellulitis of knee, left 06/29/2015  . Wound infection after surgery 06/29/2015  . OA (osteoarthritis) of knee 05/28/2015  . BRBPR (bright red blood per rectum) 11/21/2014  . Orthostatic hypotension 11/21/2014  . Dizziness 11/16/2014  . Polymyalgia rheumatica ([place]) 09/12/2014  . Anemia, iron deficiency 08/16/2014  . DJD (degenerative joint disease) 08/16/2014  . Essential hypertension   . Type 2 diabetes mellitus ([place])   . Cataract, nuclear 04/11/2014  . Complaining of back-related symptom  03/15/2014  . Arthralgia of hip or thigh 03/15/2014  . Gonalgia 10/10/2013  . 1st degree AV block 08/04/2013  . Infection of the upper respiratory tract 04/05/2013  . Cataract 02/24/2013  . Pseudoaphakia 02/24/2013  . Episode of syncope 01/11/2013  . Cardiac murmur 11/09/2012  . Near syncope 11/09/2012  . Anxiety 06/18/2012  . Clinical depression 06/18/2012  . Bleeding gastrointestinal 06/18/2012  . Diverticulitis 06/18/2012  . Difficulty hearing 06/18/2012  . Adaptive colitis 06/18/2012  . Arthritis, degenerative 06/18/2012  . Disease of thyroid gland 06/18/2012  . Multinodular goiter 01/29/2012  . 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 Adenoma of large intestine 05/31/2011  . Cephalalgia 05/31/2011  . Hypercholesterolemia 05/31/2011  . Benign prostatic hyperplasia with urinary obstruction 05/31/2011  . LBP (low back pain) 05/31/2011  . Diabetes mellitus ([place]) 05/31/2011  . Essential (primary) hypertension 05/31/2011    Orientation RESPIRATION BLADDER Height & Weight     Self,Time,Situation,Place  Normal Continent Weight: 200 lb (90.7 kg) Height:  6\' 2"  (188 cm)  BEHAVIORAL SYMPTOMS/MOOD NEUROLOGICAL BOWEL NUTRITION STATUS      Continent Diet  AMBULATORY STATUS COMMUNICATION OF NEEDS Skin   Extensive Assist Verbally Normal                       Personal Care Assistance Level of Assistance  Bathing,Feeding,Dressing,Total care Bathing Assistance: Limited assistance Feeding assistance: Independent Dressing Assistance: Limited assistance Total Care Assistance: Limited assistance   Functional Limitations Info  Sight,Hearing,Speech Sight Info: Adequate Hearing Info: Impaired Speech Info: Adequate    SPECIAL CARE FACTORS FREQUENCY  PT (By licensed PT),OT (By licensed OT)     PT Frequency:  5 times weekly OT Frequency: 5 times weekly            Contractures Contractures Info: Not present    Additional Factors Info  Code Status,Allergies Code Status Info:  Full Allergies Info: Iodine, Calan, Cymbalta, Iodinated Diagnostic Agents, Latex, Lisinopril, Neurontin, Zoloft, Valium, Flomax, Glimepiride, Melatonin, Tape           Current Medications (12/03/2020):  This is the current hospital active medication list No current facility-administered medications for this encounter.   Current Outpatient Medications  Medication Sig Dispense Refill  . alfuzosin (UROXATRAL) 10 MG 24 hr tablet Take 10 mg by mouth every evening.    [name] Kitchen Apremilast (OTEZLA) 10 & 20 & 30 MG TBPK Take 10-30 mg by mouth See admin instructions. Take 10 mg by mouth daily on day 1, take 10 mg by mouth twice daily on day 2, take 10 mg by mouth in the morning and take 20 mg by mouth in the evening on day 3, take 20 mg by mouth twice daily on day 4, take 20 mg by mouth in the morning and take 30 mg by mouth in the evening on day 5, take 30 mg by mouth twice daily on day 6, then continue 30 mg by mouth twice daily    . atorvastatin (LIPITOR) 40 MG tablet TAKE 1 TABLET BY MOUTH DAILY 90 tablet 0  . bismuth subsalicylate (PEPTO BISMOL) 262 MG/15ML suspension Take 30 mLs by mouth every 6 (six) hours as needed for diarrhea or loose stools.    . cephALEXin (KEFLEX) 500 MG capsule Take 1 capsule (500 mg total) by mouth 4 (four) times daily for 5 days. 20 capsule 0  . Cholecalciferol (VITAMIN D3) 5000 units CAPS Take 5,000 Units by mouth at bedtime.    . diclofenac sodium (VOLTAREN) 1 % GEL Apply 2 g topically daily as needed (for pain). 2 g 1  . enzalutamide (XTANDI) 80 MG tablet Take 80 mg by mouth daily.    [name] Kitchen escitalopram (LEXAPRO) 10 MG tablet Take 20 mg by mouth at bedtime.  1  . finasteride (PROSCAR) 5 MG tablet Take 1 tablet (5 mg total) by mouth daily. 90 tablet 3  . fluticasone (FLONASE) 50 MCG/ACT nasal spray Place 2 sprays into both nostrils daily. 16 g 0  . ibuprofen (ADVIL) 600 MG tablet Take 600 mg by mouth every 6 (six) hours as needed.    . midodrine (PROAMATINE) 10 MG tablet Take 10  mg by mouth 3 (three) times daily.     . pantoprazole (PROTONIX) 20 MG tablet Take 1 tablet (20 mg total) by mouth daily. Due for yearly office visit 30 tablet 0  . pregabalin (LYRICA) 75 MG capsule Take 75 mg by mouth 2 (two) times daily.    [name] Kitchen topiramate (TOPAMAX) 50 MG tablet Take 50 mg by mouth 2 (two) times daily.    [name] Kitchen venlafaxine XR (EFFEXOR-XR) 37.5 MG 24 hr capsule TAKE ONE CAPSULE BY MOUTH EVERY MORNING 30 capsule 3  . vitamin B-12 (CYANOCOBALAMIN) 500 MCG tablet Take 1,000 mcg by mouth daily.    [name] Kitchen zolpidem (AMBIEN) 5 MG tablet Take 5-10 mg by mouth at bedtime.    . fluocinonide cream (LIDEX) [id number] % Apply 1 application topically daily as needed (for irritation).  (Patient not taking: Reported on 12/03/2020)    . HYDROcodone-acetaminophen (NORCO/VICODIN) 5-325 MG tablet Take 1 tablet by mouth every 4 (four) hours as needed for up to 3 days. 10 tablet 0  Discharge Medications: Please see discharge summary for a list of discharge medications.  Relevant Imaging Results:  Relevant Lab Results:   Additional Information SSN: [id number]  Villa Herb, [place]

## 2020-12-04 DIAGNOSIS — E1143 Type 2 diabetes mellitus with diabetic autonomic (poly)neuropathy: Secondary | ICD-10-CM | POA: Diagnosis not present

## 2020-12-04 DIAGNOSIS — S8291XD Unspecified fracture of right lower leg, subsequent encounter for closed fracture with routine healing: Secondary | ICD-10-CM | POA: Diagnosis not present

## 2020-12-04 DIAGNOSIS — Z8673 Personal history of transient ischemic attack (TIA), and cerebral infarction without residual deficits: Secondary | ICD-10-CM | POA: Diagnosis not present

## 2020-12-04 DIAGNOSIS — Z20822 Contact with and (suspected) exposure to covid-19: Secondary | ICD-10-CM | POA: Diagnosis not present

## 2020-12-04 DIAGNOSIS — L409 Psoriasis, unspecified: Secondary | ICD-10-CM | POA: Diagnosis not present

## 2020-12-04 DIAGNOSIS — M6281 Muscle weakness (generalized): Secondary | ICD-10-CM | POA: Diagnosis not present

## 2020-12-04 DIAGNOSIS — M353 Polymyalgia rheumatica: Secondary | ICD-10-CM | POA: Diagnosis not present

## 2020-12-04 DIAGNOSIS — S80821D Blister (nonthermal), right lower leg, subsequent encounter: Secondary | ICD-10-CM | POA: Diagnosis not present

## 2020-12-04 DIAGNOSIS — G4700 Insomnia, unspecified: Secondary | ICD-10-CM | POA: Diagnosis not present

## 2020-12-04 DIAGNOSIS — Z87891 Personal history of nicotine dependence: Secondary | ICD-10-CM | POA: Diagnosis not present

## 2020-12-04 DIAGNOSIS — I10 Essential (primary) hypertension: Secondary | ICD-10-CM | POA: Diagnosis not present

## 2020-12-04 DIAGNOSIS — E785 Hyperlipidemia, unspecified: Secondary | ICD-10-CM | POA: Diagnosis not present

## 2020-12-04 DIAGNOSIS — S82891A Other fracture of right lower leg, initial encounter for closed fracture: Secondary | ICD-10-CM | POA: Diagnosis not present

## 2020-12-04 DIAGNOSIS — E1140 Type 2 diabetes mellitus with diabetic neuropathy, unspecified: Secondary | ICD-10-CM | POA: Diagnosis not present

## 2020-12-04 DIAGNOSIS — R2689 Other abnormalities of gait and mobility: Secondary | ICD-10-CM | POA: Diagnosis not present

## 2020-12-04 DIAGNOSIS — I951 Orthostatic hypotension: Secondary | ICD-10-CM | POA: Diagnosis not present

## 2020-12-04 DIAGNOSIS — Z91040 Latex allergy status: Secondary | ICD-10-CM | POA: Diagnosis not present

## 2020-12-04 DIAGNOSIS — Z96652 Presence of left artificial knee joint: Secondary | ICD-10-CM | POA: Diagnosis not present

## 2020-12-04 DIAGNOSIS — Z79899 Other long term (current) drug therapy: Secondary | ICD-10-CM | POA: Diagnosis not present

## 2020-12-04 DIAGNOSIS — S80821A Blister (nonthermal), right lower leg, initial encounter: Secondary | ICD-10-CM | POA: Diagnosis not present

## 2020-12-04 DIAGNOSIS — R296 Repeated falls: Secondary | ICD-10-CM | POA: Diagnosis not present

## 2020-12-04 DIAGNOSIS — E119 Type 2 diabetes mellitus without complications: Secondary | ICD-10-CM | POA: Diagnosis not present

## 2020-12-04 DIAGNOSIS — Z4800 Encounter for change or removal of nonsurgical wound dressing: Secondary | ICD-10-CM | POA: Diagnosis not present

## 2020-12-04 LAB — RESP PANEL BY RT-PCR (FLU A&B, COVID) ARPGX2
Influenza A by PCR: NEGATIVE
Influenza B by PCR: NEGATIVE
SARS Coronavirus 2 by RT PCR: NEGATIVE

## 2020-12-04 LAB — SARS CORONAVIRUS 2 (TAT 6-24 HRS): SARS Coronavirus 2: NEGATIVE

## 2020-12-04 MED ORDER — CEPHALEXIN 500 MG PO CAPS: 500.0000 mg | ORAL_CAPSULE | Freq: Four times a day (QID) | ORAL | 0 refills | Status: AC

## 2020-12-04 MED ORDER — HYDROCODONE-ACETAMINOPHEN 5-325 MG PO TABS: 1.0000 | ORAL_TABLET | ORAL | 0 refills | Status: AC | PRN

## 2020-12-04 MED ORDER — PANTOPRAZOLE SODIUM 40 MG PO TBEC
40.0000 mg | DELAYED_RELEASE_TABLET | Freq: Every day | ORAL | Status: DC
  Administered 2020-12-04: 40 mg via ORAL
  Filled 2020-12-04: qty 1

## 2020-12-04 NOTE — TOC Progression Note (Signed)
Transition of Care [facility]) - Progression Note   Patient Details  Name: Jared Bridges MRN: [id number] Date of Birth: [birth year]  Transition of Care [facility]) CM/SW Contact  Ewing Schlein, LCSW Phone Number: 12/04/2020, 3:08 PM  Clinical Narrative: CSW received call from [facility] with [facility]. Per Lynnea Ferrier, [facility] is unable to make a bed offer due to the cost of some of the patient's medications. Additional referrals faxed out. CSW spoke with patient's daughter, Jared Bridges, regarding bed offers. Daughter agreeable to [facility] if CSW does not hear back from [facility]. CSW faxed clinicals over to [facility] [phone]) and left voicemails for [facility] [phone]; [phone]). CSW did not initially hear back from [facility].  CSW spoke with Elease Hashimoto at [facility] and confirmed patient can come today. CSW completed insurance authorization for SNF with [employer]. Reference ID is: [id number]. Start date is 12/04/20 and next review date is 12/06/20. Continued stay clinicals will be faxed to Humberto Seals [phone]). Discharge summary faxed to [facility] with [facility]. Admission pending COVID test results. CSW received call from Ms. Everett with [facility] and was informed the patient would not be considered as there are no OT notes. TOC awaiting COVID tests.  Expected Discharge Plan: Skilled Nursing Facility Barriers to Discharge: Continued Medical Work up,ED SNF auth  Expected Discharge Plan and Services Expected Discharge Plan: Skilled Nursing Facility In-house Referral: Clinical Social Work Discharge Planning Services: CM Consult Post Acute Care Choice: Skilled Nursing Facility Living arrangements for the past 2 months: Single Family Home             DME Arranged: N/A DME Agency: NA HH Arranged: NA HH Agency: NA  Readmission Risk Interventions No flowsheet data found.

## 2020-12-04 NOTE — TOC Transition Note (Signed)
Transition of Care [facility]) - CM/SW Discharge Note   Patient Details  Name: Jared Bridges MRN: [id number] Date of Birth: [birth year]  Transition [facility]) CM/SW Contact:  Villa Herb, LCSWA Phone Number: 12/04/2020, 4:26 PM   Clinical Narrative:    Pt accepted bed at [facility]. Pt to d/c to [facility]. RN updated and to call report. RN to set up transport for pt to [facility]. Clinicals sent to [facility] with [facility]. Pts daughter [name] updated on pt d/c to [facility]. TOC signing off.   Final next level of care: Skilled Nursing Facility Barriers to Discharge: Barriers Resolved   Patient Goals and CMS Choice Patient states their goals for this hospitalization and ongoing recovery are:: Go to SNF CMS Medicare.gov Compare Post Acute Care list provided to:: Patient Represenative (must comment) [name]) Choice offered to / list presented to : Adult Children  Discharge Placement              Patient chooses bed at: Other - please specify in the comment section below: ([facility]) Patient to be transferred to facility by: EMS Name of family member notified: [name] Patient and family notified of of transfer: 12/04/20  Discharge Plan and Services In-house Referral: Clinical Social Work Discharge Planning Services: [employer] Consult Post Acute Care Choice: Skilled Nursing Facility          DME Arranged: N/A DME Agency: NA       HH Arranged: NA HH Agency: NA        Social Determinants of Health (SDOH) Interventions     Readmission Risk Interventions No flowsheet data found.

## 2020-12-04 NOTE — ED Notes (Signed)
Report called to [facility] at [facility] pt going to room 129.  EMS called for transport

## 2020-12-04 NOTE — ED Notes (Addendum)
[phone] [name], daughter, was called and informed that patient was being transported to [facility] facility. All questions answered at this time.

## 2020-12-04 NOTE — [place] FL2 (Signed)
[place] MEDICAID FL2 LEVEL OF CARE SCREENING TOOL     IDENTIFICATION  Patient Name: Jared Bridges Birthdate: [birth year] Sex: male Admission Date (Current Location): 12/03/2020  [facility] and [place] Number:  [employer] and Address:         Provider Number: [id number]  Attending Physician Name and Address:  Default, Provider, MD  Relative Name and Phone Number:  Nirvan Laban [phone]    Current Level of Care: Hospital Recommended Level of Care: Skilled Nursing Facility Prior Approval Number:    Date Approved/Denied:   PASRR Number: [id number]  Discharge Plan: SNF    Current Diagnoses: Patient Active Problem List   Diagnosis Date Noted  . Prostate cancer ([name] 06/20/2020  . Elevated PSA 04/18/2020  . Nocturia 04/18/2020  . Medicare annual wellness visit, subsequent 12/07/2019  . Depressed mood 12/07/2019  . Vitamin D deficiency 10/25/2019  . Memory loss 10/25/2019  . Fall 10/25/2019  . Migraine headaches 12/29/2017  . TIA (transient ischemic attack) 12/28/2017  . Rectal bleeding 11/26/2017  . Acute cholecystitis 04/05/2016  . 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 Total knee replacement status 02/28/2016  . CVA (cerebral infarction) 12/19/2015  . Acute CVA (cerebrovascular accident) ([name] 12/19/2015  . Carotid stenosis 12/19/2015  . Postoperative wound dehiscence 07/09/2015  . Type 2 diabetes mellitus, uncontrolled ([name] 06/29/2015  . Cellulitis of knee, left 06/29/2015  . Wound infection after surgery 06/29/2015  . OA (osteoarthritis) of knee 05/28/2015  . BRBPR (bright red blood per rectum) 11/21/2014  . Orthostatic hypotension 11/21/2014  . Dizziness 11/16/2014  . Polymyalgia rheumatica ([name] 09/12/2014  . Anemia, iron deficiency 08/16/2014  . DJD (degenerative joint disease) 08/16/2014  . Essential hypertension   . Type 2 diabetes mellitus ([name]   . Cataract, nuclear 04/11/2014  . Complaining of back-related  symptom 03/15/2014  . Arthralgia of hip or thigh 03/15/2014  . Gonalgia 10/10/2013  . 1st degree AV block 08/04/2013  . Infection of the upper respiratory tract 04/05/2013  . Cataract 02/24/2013  . Pseudoaphakia 02/24/2013  . Episode of syncope 01/11/2013  . Cardiac murmur 11/09/2012  . Near syncope 11/09/2012  . Anxiety 06/18/2012  . Clinical depression 06/18/2012  . Bleeding gastrointestinal 06/18/2012  . Diverticulitis 06/18/2012  . Difficulty hearing 06/18/2012  . Adaptive colitis 06/18/2012  . Arthritis, degenerative 06/18/2012  . Disease of thyroid gland 06/18/2012  . Multinodular goiter 01/29/2012  . 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 Adenoma of large intestine 05/31/2011  . Cephalalgia 05/31/2011  . Hypercholesterolemia 05/31/2011  . Benign prostatic hyperplasia with urinary obstruction 05/31/2011  . LBP (low back pain) 05/31/2011  . Diabetes mellitus ([name] 05/31/2011  . Essential (primary) hypertension 05/31/2011    Orientation RESPIRATION BLADDER Height & Weight     Self,Time,Situation,Place  Normal Continent Weight: 200 lb (90.7 kg) Height:  6\' 2"  (188 cm)  BEHAVIORAL SYMPTOMS/MOOD NEUROLOGICAL BOWEL NUTRITION STATUS      Continent Diet  AMBULATORY STATUS COMMUNICATION OF NEEDS Skin   Extensive Assist Verbally Normal                       Personal Care Assistance Level of Assistance  Bathing,Feeding,Dressing,Total care Bathing Assistance: Limited assistance Feeding assistance: Independent Dressing Assistance: Limited assistance Total Care Assistance: Limited assistance   Functional Limitations Info  Sight,Hearing,Speech Sight Info: Adequate Hearing Info: Impaired Speech Info: Adequate    SPECIAL CARE FACTORS FREQUENCY  PT (By licensed PT),OT (By licensed OT)     PT Frequency:  5 times weekly OT Frequency: 5 times weekly            Contractures Contractures Info: Not present    Additional Factors Info  Code Status,Allergies Code Status Info:  Full Allergies Info: Iodine, Calan, Cymbalta, Iodinated Diagnostic Agents, Latex, Lisinopril, Neurontin, Zoloft, Valium, Flomax, Glimepiride, Melatonin, Tape           Current Medications (12/04/2020):  This is the current hospital active medication list Current Facility-Administered Medications  Medication Dose Route Frequency Provider Last Rate Last Admin  . alfuzosin (UROXATRAL) 24 hr tablet 10 mg  10 mg Oral QPM Belaya, Maria A, PA-C      . atorvastatin (LIPITOR) tablet 40 mg  40 mg Oral Daily Sharyn Lull A, PA-C   40 mg at 12/04/20 1000  . bismuth subsalicylate (PEPTO BISMOL) 262 MG/15ML suspension 30 mL  30 mL Oral Q6H PRN Sharyn Lull A, PA-C      . cholecalciferol (VITAMIN D3) tablet 5,000 Units  5,000 Units Oral QHS Garald Balding, PA-C   5,000 Units at 12/03/20 2239  . escitalopram (LEXAPRO) tablet 20 mg  20 mg Oral QHS Sharyn Lull A, PA-C   20 mg at 12/03/20 2238  . finasteride (PROSCAR) tablet 5 mg  5 mg Oral Daily Sharyn Lull A, PA-C   5 mg at 12/04/20 1000  . fluticasone (FLONASE) 50 MCG/ACT nasal spray 2 spray  2 spray Each Nare Daily Belaya, Maria A, PA-C      . ibuprofen (ADVIL) tablet 600 mg  600 mg Oral Q6H PRN Sharyn Lull A, PA-C   600 mg at 12/03/20 1803  . midodrine (PROAMATINE) tablet 10 mg  10 mg Oral TID WC Belaya, Maria A, PA-C      . pantoprazole (PROTONIX) EC tablet 40 mg  40 mg Oral Daily Milton Ferguson, MD   40 mg at 12/04/20 0959  . pregabalin (LYRICA) capsule 75 mg  75 mg Oral BID Sharyn Lull A, PA-C   75 mg at 12/04/20 1000  . topiramate (TOPAMAX) tablet 50 mg  50 mg Oral BID Sharyn Lull A, PA-C   50 mg at 12/04/20 1000  . venlafaxine XR (EFFEXOR-XR) 24 hr capsule 37.5 mg  37.5 mg Oral q morning - 10a Belaya, Maria A, PA-C   37.5 mg at 12/04/20 0959  . vitamin B-12 (CYANOCOBALAMIN) tablet 1,000 mcg  1,000 mcg Oral Daily Sharyn Lull A, PA-C   1,000 mcg at 12/04/20 [id number]  . zolpidem (AMBIEN) tablet 5 mg  5 mg Oral QHS PRN Milton Ferguson, MD        Current Outpatient Medications  Medication Sig Dispense Refill  . alfuzosin (UROXATRAL) 10 MG 24 hr tablet Take 10 mg by mouth every evening.    [name] Kitchen Apremilast (OTEZLA) 10 & 20 & 30 MG TBPK Take 10-30 mg by mouth See admin instructions. Take 10 mg by mouth daily on day 1, take 10 mg by mouth twice daily on day 2, take 10 mg by mouth in the morning and take 20 mg by mouth in the evening on day 3, take 20 mg by mouth twice daily on day 4, take 20 mg by mouth in the morning and take 30 mg by mouth in the evening on day 5, take 30 mg by mouth twice daily on day 6, then continue 30 mg by mouth twice daily    . atorvastatin (LIPITOR) 40 MG tablet TAKE 1 TABLET BY MOUTH DAILY 90 tablet 0  . bismuth subsalicylate (PEPTO BISMOL)  262 MG/15ML suspension Take 30 mLs by mouth every 6 (six) hours as needed for diarrhea or loose stools.    . cephALEXin (KEFLEX) 500 MG capsule Take 1 capsule (500 mg total) by mouth 4 (four) times daily for 5 days. 20 capsule 0  . Cholecalciferol (VITAMIN D3) 5000 units CAPS Take 5,000 Units by mouth at bedtime.    . diclofenac sodium (VOLTAREN) 1 % GEL Apply 2 g topically daily as needed (for pain). 2 g 1  . enzalutamide (XTANDI) 80 MG tablet Take 80 mg by mouth daily.    [name] Kitchen escitalopram (LEXAPRO) 10 MG tablet Take 20 mg by mouth at bedtime.  1  . finasteride (PROSCAR) 5 MG tablet Take 1 tablet (5 mg total) by mouth daily. 90 tablet 3  . fluticasone (FLONASE) 50 MCG/ACT nasal spray Place 2 sprays into both nostrils daily. 16 g 0  . ibuprofen (ADVIL) 600 MG tablet Take 600 mg by mouth every 6 (six) hours as needed.    . midodrine (PROAMATINE) 10 MG tablet Take 10 mg by mouth 3 (three) times daily.     . pantoprazole (PROTONIX) 20 MG tablet Take 1 tablet (20 mg total) by mouth daily. Due for yearly office visit 30 tablet 0  . pregabalin (LYRICA) 75 MG capsule Take 75 mg by mouth 2 (two) times daily.    [name] Kitchen topiramate (TOPAMAX) 50 MG tablet Take 50 mg by mouth 2 (two) times daily.     [name] Kitchen venlafaxine XR (EFFEXOR-XR) 37.5 MG 24 hr capsule TAKE ONE CAPSULE BY MOUTH EVERY MORNING 30 capsule 3  . vitamin B-12 (CYANOCOBALAMIN) 500 MCG tablet Take 1,000 mcg by mouth daily.    [name] Kitchen zolpidem (AMBIEN) 5 MG tablet Take 5-10 mg by mouth at bedtime.    . fluocinonide cream (LIDEX) [id number] % Apply 1 application topically daily as needed (for irritation).  (Patient not taking: Reported on 12/03/2020)    . HYDROcodone-acetaminophen (NORCO/VICODIN) 5-325 MG tablet Take 1 tablet by mouth every 4 (four) hours as needed for up to 3 days. 10 tablet 0     Discharge Medications: Please see discharge summary for a list of discharge medications.  Relevant Imaging Results:  Relevant Lab Results:   Additional Information SSN: [id number]  Sherie Don, LCSW

## 2020-12-04 NOTE — ED Provider Notes (Signed)
Discharge Summary  HPI: "87 year old male with a history of CKD, depression, diverticulitis, orthostatic hypotension, PUD, DM type II, TIA presents to the ER with complaints of right ankle pain after a fall.  Patient states that he was seen here approximately a month ago and diagnosed with a left ankle fracture after a fall.  States he has been wearing his ankle brace and notes improvement in his pain in the left ankle.  He has not followed up with orthopedics.  He states that he fell on January 1, was seen in urgent care and had an x-ray done which did not show any fractures.  He states that he again fell yesterday just due to the loss of balance and felt like he reinjured his right ankle.  He reports severe pain and "lightning bolts "shooting through his foot.  Has not been able to bear weight.  He also noticed some blisters at developed on the medial side of his shin.  Denies any numbness or tingling.  He maintains that he did not fall or hit his head.  Denies any unilateral weakness, dizziness. He is not on any anticoagulants"  Hospital Course: Patient seen in the ED, workup did not demonstrate a reason for medical admission. Evaluated by Social Work who deemed the patient a candidate for SNF placement. No further events overnight.   No current facility-administered medications on file prior to encounter.   Current Outpatient Medications on File Prior to Encounter  Medication Sig Dispense Refill  . alfuzosin (UROXATRAL) 10 MG 24 hr tablet Take 10 mg by mouth every evening.    [name] Kitchen Apremilast (OTEZLA) 10 & 20 & 30 MG TBPK Take 10-30 mg by mouth See admin instructions. Take 10 mg by mouth daily on day 1, take 10 mg by mouth twice daily on day 2, take 10 mg by mouth in the morning and take 20 mg by mouth in the evening on day 3, take 20 mg by mouth twice daily on day 4, take 20 mg by mouth in the morning and take 30 mg by mouth in the evening on day 5, take 30 mg by mouth twice daily on day 6, then  continue 30 mg by mouth twice daily    . atorvastatin (LIPITOR) 40 MG tablet TAKE 1 TABLET BY MOUTH DAILY 90 tablet 0  . bismuth subsalicylate (PEPTO BISMOL) 262 MG/15ML suspension Take 30 mLs by mouth every 6 (six) hours as needed for diarrhea or loose stools.    . Cholecalciferol (VITAMIN D3) 5000 units CAPS Take 5,000 Units by mouth at bedtime.    . diclofenac sodium (VOLTAREN) 1 % GEL Apply 2 g topically daily as needed (for pain). 2 g 1  . enzalutamide (XTANDI) 80 MG tablet Take 80 mg by mouth daily.    [name] Kitchen escitalopram (LEXAPRO) 10 MG tablet Take 20 mg by mouth at bedtime.  1  . finasteride (PROSCAR) 5 MG tablet Take 1 tablet (5 mg total) by mouth daily. 90 tablet 3  . fluticasone (FLONASE) 50 MCG/ACT nasal spray Place 2 sprays into both nostrils daily. 16 g 0  . ibuprofen (ADVIL) 600 MG tablet Take 600 mg by mouth every 6 (six) hours as needed.    . midodrine (PROAMATINE) 10 MG tablet Take 10 mg by mouth 3 (three) times daily.     . pantoprazole (PROTONIX) 20 MG tablet Take 1 tablet (20 mg total) by mouth daily. Due for yearly office visit 30 tablet 0  . pregabalin (LYRICA) 75 MG capsule  Take 75 mg by mouth 2 (two) times daily.    [name] Kitchen topiramate (TOPAMAX) 50 MG tablet Take 50 mg by mouth 2 (two) times daily.    [name] Kitchen venlafaxine XR (EFFEXOR-XR) 37.5 MG 24 hr capsule TAKE ONE CAPSULE BY MOUTH EVERY MORNING 30 capsule 3  . vitamin B-12 (CYANOCOBALAMIN) 500 MCG tablet Take 1,000 mcg by mouth daily.    [name] Kitchen zolpidem (AMBIEN) 5 MG tablet Take 5-10 mg by mouth at bedtime.    . fluocinonide cream (LIDEX) 0.05 % Apply 1 application topically daily as needed (for irritation).  (Patient not taking: Reported on 12/03/2020)        Pollyann Savoy, MD 12/04/20 [phone]

## 2020-12-05 ENCOUNTER — Other Ambulatory Visit (INDEPENDENT_AMBULATORY_CARE_PROVIDER_SITE_OTHER): Payer: Self-pay | Admitting: Internal Medicine

## 2020-12-05 DIAGNOSIS — Z8673 Personal history of transient ischemic attack (TIA), and cerebral infarction without residual deficits: Secondary | ICD-10-CM | POA: Diagnosis not present

## 2020-12-05 DIAGNOSIS — S82891A Other fracture of right lower leg, initial encounter for closed fracture: Secondary | ICD-10-CM | POA: Diagnosis not present

## 2020-12-05 DIAGNOSIS — E1143 Type 2 diabetes mellitus with diabetic autonomic (poly)neuropathy: Secondary | ICD-10-CM | POA: Diagnosis not present

## 2020-12-07 ENCOUNTER — Telehealth: Payer: Self-pay | Admitting: Orthopedic Surgery

## 2020-12-07 NOTE — Telephone Encounter (Signed)
Voice message received from [facility], [place], ph# [phone] at ext [phone]. Called back, and received voice mail, left message, requesting to call back or to fax what is needed, as we have no current information as to what patient may need to be scheduled for.

## 2020-12-12 NOTE — Telephone Encounter (Signed)
Our office reached [facility] facility; scheduled appointment; pt/family aware* *Patient's daughter [name] then called and requested to cancel, due to patient having to return to [facility]; said also has seen ortho there. Will call back if needs to reschedule with our clinic; thanked [name] for our assistance.

## 2020-12-18 ENCOUNTER — Ambulatory Visit: Payer: Medicare Other | Admitting: Orthopaedic Surgery

## 2020-12-24 ENCOUNTER — Telehealth (INDEPENDENT_AMBULATORY_CARE_PROVIDER_SITE_OTHER): Payer: Self-pay

## 2020-12-24 NOTE — Telephone Encounter (Signed)
Arbie Cookey, an RN with [facility] stated that his daughter Jared Bridges has declined services since patient is home from rehab and patient is doing much better. If you need to reach Arbie Cookey her number is [phone].  Sending as Juluis Rainier.

## 2020-12-26 DIAGNOSIS — M6281 Muscle weakness (generalized): Secondary | ICD-10-CM | POA: Diagnosis not present

## 2020-12-26 DIAGNOSIS — R2689 Other abnormalities of gait and mobility: Secondary | ICD-10-CM | POA: Diagnosis not present

## 2021-01-02 DIAGNOSIS — Z510 Encounter for antineoplastic radiation therapy: Secondary | ICD-10-CM | POA: Diagnosis not present

## 2021-01-03 ENCOUNTER — Ambulatory Visit (INDEPENDENT_AMBULATORY_CARE_PROVIDER_SITE_OTHER): Payer: Medicare Other | Admitting: Internal Medicine

## 2021-01-03 ENCOUNTER — Other Ambulatory Visit: Payer: Self-pay

## 2021-01-03 ENCOUNTER — Encounter (INDEPENDENT_AMBULATORY_CARE_PROVIDER_SITE_OTHER): Payer: Self-pay | Admitting: Internal Medicine

## 2021-01-03 VITALS — BP 128/70 | HR 68 | Temp 98.0°F | Resp 18 | Ht 74.0 in | Wt 210.0 lb

## 2021-01-03 DIAGNOSIS — S82892D Other fracture of left lower leg, subsequent encounter for closed fracture with routine healing: Secondary | ICD-10-CM | POA: Diagnosis not present

## 2021-01-03 DIAGNOSIS — M25571 Pain in right ankle and joints of right foot: Secondary | ICD-10-CM

## 2021-01-03 DIAGNOSIS — L03115 Cellulitis of right lower limb: Secondary | ICD-10-CM | POA: Diagnosis not present

## 2021-01-03 MED ORDER — AMOXICILLIN-POT CLAVULANATE 875-125 MG PO TABS: 1.0000 | ORAL_TABLET | Freq: Two times a day (BID) | ORAL | 0 refills | Status: DC

## 2021-01-03 NOTE — Progress Notes (Signed)
Metrics: Intervention Frequency ACO  Documented Smoking Status Yearly  Screened one or more times in 24 months  Cessation Counseling or  Active cessation medication Past 24 months  Past 24 months   Guideline developer: UpToDate (See UpToDate for funding source) Date Released: 2014       Wellness Office Visit  Subjective:  Patient ID: Jared Bridges, male    DOB: Jul 09, 1930  Age: 87 y.o. MRN: [id number]  CC: Right ankle/foot redness/discomfort. HPI  The patient comes in with the above symptoms. He recently sprained the right ankle area and had to go to the emergency room about 1 month ago. X-rays of this area did not show any fracture. A month prior to that, he had avulsion fracture of the left foot/talus area. They would like a orthopedic referral for this. Past Medical History:  Diagnosis Date  . Anemia, iron deficiency   . Chronic headaches    Migraines  . Chronic kidney disease    Resolved  . Depression   . Diverticulitis   . Essential hypertension   . GI bleed    Dr. Laural Golden - 1998  . Hypercholesteremia   . Orthostatic hypotension   . Peptic ulcer disease   . Peripheral neuropathy   . Polymyalgia rheumatica ([place])   . Stroke [facility])    Residual trouble reading and writing  . Syncope   . Thyroid nodule   . TIA (transient ischemic attack) 12/2017  . Type 2 diabetes mellitus ([place])    Past Surgical History:  Procedure Laterality Date  . APPENDECTOMY     as child  . BACK SURGERY     2000 IDET SPINAL PROC  . CHOLECYSTECTOMY N/A 04/07/2016   Procedure: CHOLECYSTECTOMY;  Surgeon: Aviva Signs, MD;  Location: [facility] ORS;  Service: General;  Laterality: N/A;  . [place]  . COLONOSCOPY N/A 01/07/2018   Procedure: COLONOSCOPY;  Surgeon: Rogene Houston, MD;  Location: [facility] ENDO [facility];  Service: Endoscopy;  Laterality: N/A;  1:00  . CRYOTHERAPY    . EYE SURGERY     right cataract with lens implant  . FEMUR SURGERY     RT LEG        1953  . HAND  SURGERY     RIGHT  . HERNIA REPAIR     1-right inguinal, 3- left inguinal  . IRRIGATION AND DEBRIDEMENT KNEE Left 07/09/2015   Procedure: LEFT KNEE IRRIGATION AND DEBRIDEMENT WOUND CLOSURE;  Surgeon: Gaynelle Arabian, MD;  Location: [facility];  Service: Orthopedics;  Laterality: Left;  . JOINT REPLACEMENT  02/28/2016   lt knee revision  . KNEE DEBRIDEMENT     2016   LEFT  . MENISCUS REPAIR     LEFT  . POLYPECTOMY  01/07/2018   Procedure: POLYPECTOMY;  Surgeon: Rogene Houston, MD;  Location: [facility];  Service: Endoscopy;;  colon   . SHOULDER OPEN ROTATOR CUFF REPAIR     RT SHOULDER  . SPINAL FUSION    . SPINAL FUSION    . TONSILLECTOMY    . TOTAL KNEE ARTHROPLASTY Left 05/28/2015   Procedure: LEFT TOTAL KNEE ARTHROPLASTY;  Surgeon: Gaynelle Arabian, MD;  Location: [facility];  Service: Orthopedics;  Laterality: Left;  . TOTAL KNEE REVISION Left 02/28/2016   Procedure: LEFT TOTAL KNEE REVISION;  Surgeon: Leandrew Koyanagi, MD;  Location: [place];  Service: Orthopedics;  Laterality: Left;  . TRIGGER FINGER RELEASE     RT HAND  1990S  . UVULECTOMY  2004   TURBINATE,TONSIL,ADENOID     Family History  Problem Relation Age of Onset  . Colon cancer Sister   . Hypertension Other   . Depression Brother   . 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   Social History   Tobacco Use  . Smoking status: Former Smoker    Packs/day: 0.10    Years: 18.00    Pack years: 1.80    Types: Cigarettes    Start date: 12/18/1958    Quit date: 12/01/1986    Years since quitting: 34.1  . Smokeless tobacco: Never Used  Substance Use Topics  . Alcohol use: No    Alcohol/week: 0.0 standard drinks    Current Meds  Medication Sig  . amoxicillin-clavulanate (AUGMENTIN) 875-125 MG tablet Take 1 tablet by mouth 2 (two) times daily.      Depression screen [facility] 2/9 01/03/2021 03/06/2020 12/07/2019  Decreased Interest 0 0 0  Down,  Depressed, Hopeless 0 0 0  PHQ - 2 Score 0 0 0     Objective:   Today's Vitals: BP 128/70 (BP Location: Right Arm, Patient Position: Sitting, Cuff Size: Normal)   Pulse 68   Temp 98 F (36.7 C) (Temporal)   Resp 18   Ht 6\' 2"  (1.88 m)   Wt 210 lb (95.3 kg)   BMI 26.96 kg/m  Vitals with BMI 01/03/2021 12/04/2020 12/04/2020  Height 6\' 2"  - -  Weight 210 lbs - -  BMI 16.10 - -  Systolic 960 454 098  Diastolic 70 43 63  Pulse 68 77 60  Some encounter information is confidential and restricted. Go to Review Flowsheets activity to see all data.     Physical Exam   Examination of the right ankle/foot area shows erythema and warmth over this area. I cannot specifically find any skin breakdown but this feels like and looks like cellulitis.    Assessment   1. Cellulitis of right lower extremity   2. Acute right ankle pain   3. Closed fracture of left ankle with routine healing, subsequent encounter       Tests ordered Orders Placed This Encounter  Procedures  . Ambulatory referral to Orthopedic Surgery     Plan: 1. I will treat him with Augmentin to see if we can help the cellulitis. 2. I will refer him to orthopedic surgery regarding the fracture on the left foot to see if there are any further recommendations. I told the patient that I doubt that he would need surgery but we will make the referral.   Meds ordered this encounter  Medications  . amoxicillin-clavulanate (AUGMENTIN) 875-125 MG tablet    Sig: Take 1 tablet by mouth 2 (two) times daily.    Dispense:  20 tablet    Refill:  0    Olamide Carattini Luther Parody, MD

## 2021-01-08 DIAGNOSIS — Z510 Encounter for antineoplastic radiation therapy: Secondary | ICD-10-CM | POA: Diagnosis not present

## 2021-01-09 ENCOUNTER — Encounter (INDEPENDENT_AMBULATORY_CARE_PROVIDER_SITE_OTHER): Payer: Self-pay | Admitting: Internal Medicine

## 2021-01-14 ENCOUNTER — Encounter (INDEPENDENT_AMBULATORY_CARE_PROVIDER_SITE_OTHER): Payer: Self-pay | Admitting: Internal Medicine

## 2021-01-17 DIAGNOSIS — Z510 Encounter for antineoplastic radiation therapy: Secondary | ICD-10-CM | POA: Diagnosis not present

## 2021-01-18 DIAGNOSIS — Z510 Encounter for antineoplastic radiation therapy: Secondary | ICD-10-CM | POA: Diagnosis not present

## 2021-01-21 DIAGNOSIS — Z510 Encounter for antineoplastic radiation therapy: Secondary | ICD-10-CM | POA: Diagnosis not present

## 2021-01-22 DIAGNOSIS — M6281 Muscle weakness (generalized): Secondary | ICD-10-CM | POA: Diagnosis not present

## 2021-01-22 DIAGNOSIS — Z510 Encounter for antineoplastic radiation therapy: Secondary | ICD-10-CM | POA: Diagnosis not present

## 2021-01-23 DIAGNOSIS — Z510 Encounter for antineoplastic radiation therapy: Secondary | ICD-10-CM | POA: Diagnosis not present

## 2021-01-24 ENCOUNTER — Ambulatory Visit: Payer: Medicare Other

## 2021-01-24 ENCOUNTER — Encounter: Payer: Self-pay | Admitting: Radiology

## 2021-01-24 ENCOUNTER — Encounter: Payer: Self-pay | Admitting: Orthopedic Surgery

## 2021-01-24 ENCOUNTER — Ambulatory Visit (INDEPENDENT_AMBULATORY_CARE_PROVIDER_SITE_OTHER): Payer: Medicare Other | Admitting: Orthopedic Surgery

## 2021-01-24 ENCOUNTER — Telehealth: Payer: Self-pay | Admitting: Orthopedic Surgery

## 2021-01-24 ENCOUNTER — Other Ambulatory Visit: Payer: Self-pay

## 2021-01-24 VITALS — BP 112/52 | HR 79 | Ht 74.0 in | Wt 210.0 lb

## 2021-01-24 DIAGNOSIS — S82891A Other fracture of right lower leg, initial encounter for closed fracture: Secondary | ICD-10-CM | POA: Diagnosis not present

## 2021-01-24 DIAGNOSIS — S93491A Sprain of other ligament of right ankle, initial encounter: Secondary | ICD-10-CM | POA: Diagnosis not present

## 2021-01-24 DIAGNOSIS — Z510 Encounter for antineoplastic radiation therapy: Secondary | ICD-10-CM | POA: Diagnosis not present

## 2021-01-24 DIAGNOSIS — M79661 Pain in right lower leg: Secondary | ICD-10-CM | POA: Diagnosis not present

## 2021-01-24 DIAGNOSIS — S92102A Unspecified fracture of left talus, initial encounter for closed fracture: Secondary | ICD-10-CM | POA: Diagnosis not present

## 2021-01-24 NOTE — Addendum Note (Signed)
Addended byCandice Camp on: 01/24/2021 02:46 PM   Modules accepted: Orders

## 2021-01-24 NOTE — Telephone Encounter (Signed)
Patient daughter called and said that her dad has changed his mind and wants to go to [facility].  They want to know if Dr. Aline Brochure has a doctor in mind for the referral to go there, or if Dr. Waynetta Pean II

## 2021-01-24 NOTE — Progress Notes (Signed)
NEW PROBLEM//OFFICE VISIT  Summary assessment and plan: 87 year old male undergoing prostate cancer radiation treatment delayed presentation of Maisonneuve Weber C ankle fracture with medial clear space widening and incomplete healing of the fibular fracture medial pain and ankle deformity  I have advised the patient seek a foot and ankle specialist Dr. Sharol Given for definitive management which may include deltoid ligament repair and syndesmosis fixation  Chief Complaint  Patient presents with  . Ankle Pain    Bilateral ankles painful has had left ankle right ankle sprained     77 male prostate cancer undergoing treatment fell on 2 separate occasions once in December once in January  December fell injured left foot lateral avulsion talus went to inpatient rehab and then  January fell injured right ankle diagnosis ankle sprain x-rays show widening of the medial clear space patient now complains that his right foot turns out he has medial ankle pain and syndesmosis pain and pain in the fibular shaft more proximal x-rays did not cover this area no repeat films were done to assess for possible Weber C Maisonneuve type injury   Review of Systems  HENT: Positive for hearing loss.   Genitourinary: Positive for dysuria and urgency.  Neurological: Negative for tingling and sensory change.  All other systems reviewed and are negative.    Past Medical History:  Diagnosis Date  . Anemia, iron deficiency   . Chronic headaches    Migraines  . Chronic kidney disease    Resolved  . Depression   . Diverticulitis   . Essential hypertension   . GI bleed    Dr. Laural Golden - 1998  . Hypercholesteremia   . Orthostatic hypotension   . Peptic ulcer disease   . Peripheral neuropathy   . Polymyalgia rheumatica ([place])   . Stroke [facility])    Residual trouble reading and writing  . Syncope   . Thyroid nodule   . TIA (transient ischemic attack) 12/2017  . Type 2 diabetes mellitus ([place])     Past Surgical  History:  Procedure Laterality Date  . APPENDECTOMY     as child  . BACK SURGERY     2000 IDET SPINAL PROC  . CHOLECYSTECTOMY N/A 04/07/2016   Procedure: CHOLECYSTECTOMY;  Surgeon: Aviva Signs, MD;  Location: [facility];  Service: General;  Laterality: N/A;  . [place]  . COLONOSCOPY N/A 01/07/2018   Procedure: COLONOSCOPY;  Surgeon: Rogene Houston, MD;  Location: [facility];  Service: Endoscopy;  Laterality: N/A;  1:00  . CRYOTHERAPY    . EYE SURGERY     right cataract with lens implant  . FEMUR SURGERY     RT LEG        1953  . HAND SURGERY     RIGHT  . HERNIA REPAIR     1-right inguinal, 3- left inguinal  . IRRIGATION AND DEBRIDEMENT KNEE Left 07/09/2015   Procedure: LEFT KNEE IRRIGATION AND DEBRIDEMENT WOUND CLOSURE;  Surgeon: Gaynelle Arabian, MD;  Location: [facility];  Service: Orthopedics;  Laterality: Left;  . JOINT REPLACEMENT  02/28/2016   lt knee revision  . KNEE DEBRIDEMENT     2016   LEFT  . MENISCUS REPAIR     LEFT  . POLYPECTOMY  01/07/2018   Procedure: POLYPECTOMY;  Surgeon: Rogene Houston, MD;  Location: [facility];  Service: Endoscopy;;  colon   . SHOULDER OPEN ROTATOR CUFF REPAIR     RT SHOULDER  .  SPINAL FUSION    . SPINAL FUSION    . TONSILLECTOMY    . TOTAL KNEE ARTHROPLASTY Left 05/28/2015   Procedure: LEFT TOTAL KNEE ARTHROPLASTY;  Surgeon: Gaynelle Arabian, MD;  Location: [facility];  Service: Orthopedics;  Laterality: Left;  . TOTAL KNEE REVISION Left 02/28/2016   Procedure: LEFT TOTAL KNEE REVISION;  Surgeon: Leandrew Koyanagi, MD;  Location: [place];  Service: Orthopedics;  Laterality: Left;  . TRIGGER FINGER RELEASE     RT HAND   1990S  . UVULECTOMY  2004   TURBINATE,TONSIL,ADENOID    Family History  Problem Relation Age of Onset  . Colon cancer Sister   . Hypertension Other   . Depression Brother   . Depression Daughter    Social History   Tobacco Use  . Smoking status: Former Smoker    Packs/day: 0.10    Years:  18.00    Pack years: 1.80    Types: Cigarettes    Start date: 12/18/1958    Quit date: 12/01/1986    Years since quitting: 34.1  . Smokeless tobacco: Never Used  Vaping Use  . Vaping Use: Never used  Substance Use Topics  . Alcohol use: No    Alcohol/week: 0.0 standard drinks  . Drug use: No    Allergies  Allergen Reactions  . Iodine Hives, Itching and Other (See Comments)    very allergic(per daughter), can pre-med with benadryl  . Calan [Verapamil] Other (See Comments)    Weakness  . Cymbalta [Duloxetine Hcl] Other (See Comments)    Makes patient have jerking motions.  . Iodinated Diagnostic Agents Hives, Itching and Other (See Comments)    Can pre-med with benadryl  . Latex Other (See Comments)    Redness iritation  . Lisinopril Other (See Comments)    Makes patient have jerking motions.  . Neurontin [Gabapentin] Other (See Comments)    Makes patient have jerking motions.  [name] Kitchen Zoloft [Sertraline Hcl] Diarrhea and Other (See Comments)    Makes patient have jerking motions.  . Valium [Diazepam] Other (See Comments)    "Did not work"  . Flomax [Tamsulosin Hcl] Other (See Comments)    Dizziness  . Glimepiride Other (See Comments)    "Did not work"  . Melatonin Nausea Only  . Tape Other (See Comments)    Skin irritation    Current Meds  Medication Sig  . alfuzosin (UROXATRAL) 10 MG 24 hr tablet Take 10 mg by mouth every evening.  [name] Kitchen Apremilast (OTEZLA) 10 & 20 & 30 MG TBPK Take 10-30 mg by mouth See admin instructions. Take 10 mg by mouth daily on day 1, take 10 mg by mouth twice daily on day 2, take 10 mg by mouth in the morning and take 20 mg by mouth in the evening on day 3, take 20 mg by mouth twice daily on day 4, take 20 mg by mouth in the morning and take 30 mg by mouth in the evening on day 5, take 30 mg by mouth twice daily on day 6, then continue 30 mg by mouth twice daily  . atorvastatin (LIPITOR) 40 MG tablet TAKE 1 TABLET BY MOUTH DAILY  . Cholecalciferol  (VITAMIN D3) 5000 units CAPS Take 5,000 Units by mouth at bedtime.  . diclofenac sodium (VOLTAREN) 1 % GEL Apply 2 g topically daily as needed (for pain).  . enzalutamide (XTANDI) 80 MG tablet Take 80 mg by mouth daily.  [name] Kitchen escitalopram (LEXAPRO) 10 MG tablet Take 20 mg  by mouth at bedtime.  . finasteride (PROSCAR) 5 MG tablet Take 1 tablet (5 mg total) by mouth daily.  . fluocinonide cream (LIDEX) 4.01 % Apply 1 application topically daily as needed (for irritation).  . fluticasone (FLONASE) 50 MCG/ACT nasal spray Place 2 sprays into both nostrils daily.  [name] Kitchen ibuprofen (ADVIL) 600 MG tablet Take 600 mg by mouth every 6 (six) hours as needed.  . midodrine (PROAMATINE) 10 MG tablet Take 10 mg by mouth 3 (three) times daily.   . pantoprazole (PROTONIX) 20 MG tablet Take 1 tablet (20 mg total) by mouth daily. Due for yearly office visit  . pregabalin (LYRICA) 75 MG capsule Take 75 mg by mouth 2 (two) times daily.  [name] Kitchen topiramate (TOPAMAX) 50 MG tablet Take 50 mg by mouth 2 (two) times daily.  [name] Kitchen venlafaxine XR (EFFEXOR-XR) 37.5 MG 24 hr capsule TAKE ONE CAPSULE BY MOUTH EVERY MORNING  . vitamin B-12 (CYANOCOBALAMIN) 500 MCG tablet Take 1,000 mcg by mouth daily.  [name] Kitchen zolpidem (AMBIEN) 5 MG tablet Take 5-10 mg by mouth at bedtime.    BP (!) 112/52   Pulse 79   Ht 6\' 2"  (1.88 m)   Wt 210 lb (95.3 kg)   BMI 26.96 kg/m   Physical Exam Constitutional:      General: He is not in acute distress.    Appearance: He is well-developed.     Comments: Well developed, well nourished Normal grooming and hygiene     Cardiovascular:     Comments: No peripheral edema Musculoskeletal:     Comments: Left foot and ankle alignment is normal there is hyperemia without signs of infection Motion is normal Minimal tenderness in the foot lateral side where the talus avulsion fracture occurred  Right foot is in planovalgus position there is tenderness over the deltoid ligament  Diffuse tenderness in the  syndesmosis  Questionable pain in the fibula proximal to the syndesmosis  Also has hyperemia  Skin is otherwise intact neurovascular exam is otherwise normal  Skin:    General: Skin is warm and dry.  Neurological:     Mental Status: He is alert and oriented to person, place, and time.     Sensory: No sensory deficit.     Coordination: Coordination normal.     Gait: Gait normal.     Deep Tendon Reflexes: Reflexes are normal and symmetric.  Psychiatric:        Mood and Affect: Mood normal.        Behavior: Behavior normal.        Thought Content: Thought content normal.        Judgment: Judgment normal.     Comments: Affect normal          MEDICAL DECISION MAKING  A.  Encounter Diagnoses  Name Primary?  . Pain in right lower leg Yes  . Closed fracture of right ankle, initial encounter   . Closed nondisplaced fracture of left talus, unspecified portion of talus, initial encounter     B. DATA ANALYSED:   IMAGING: Interpretation of images: External images reviewed  1.  X-rays from December of the foot December 2 on the left side talar avulsion fracture ankle intact  2.  X-rays right ankle January 3 medial clear space widening calcification in this area questionable syndesmosis injury  3.  X-rays left ankle normal December 2  4.  Right foot x-ray January 3 normal   internal images show a tib-fib x-ray on the right with a partially healed  proximal fibular fracture  This confirms that this was a [employer] C type ankle fracture  Outside records reviewed: Primary care internal medicine on February 3 Dr. Anastasio Champion started patient on Augmentin   C. MANAGEMENT   Referral made to foot and ankle specialist for definitive management No orders of the defined types were placed in this encounter.   Acute complicated injury, outside films reviewed, surgery required for definitive management  Arther Abbott, MD  01/24/2021 12:27 PM

## 2021-01-25 DIAGNOSIS — Z510 Encounter for antineoplastic radiation therapy: Secondary | ICD-10-CM | POA: Diagnosis not present

## 2021-01-25 NOTE — Telephone Encounter (Signed)
(  [phone] is phone number I have faxed records to them [phone] but she will have to call to schedule.   She will have to get images on disk

## 2021-01-25 NOTE — Telephone Encounter (Signed)
Angela Nevin told me patients daughter called back and she transferred her, but I was in xray.   I called daughter  She will get disk and call for appointment

## 2021-01-28 DIAGNOSIS — Z510 Encounter for antineoplastic radiation therapy: Secondary | ICD-10-CM | POA: Diagnosis not present

## 2021-01-29 DIAGNOSIS — Z510 Encounter for antineoplastic radiation therapy: Secondary | ICD-10-CM | POA: Diagnosis not present

## 2021-01-30 DIAGNOSIS — Z510 Encounter for antineoplastic radiation therapy: Secondary | ICD-10-CM | POA: Diagnosis not present

## 2021-01-31 DIAGNOSIS — Z510 Encounter for antineoplastic radiation therapy: Secondary | ICD-10-CM | POA: Diagnosis not present

## 2021-02-01 DIAGNOSIS — Z510 Encounter for antineoplastic radiation therapy: Secondary | ICD-10-CM | POA: Diagnosis not present

## 2021-02-04 DIAGNOSIS — Z510 Encounter for antineoplastic radiation therapy: Secondary | ICD-10-CM | POA: Diagnosis not present

## 2021-02-05 DIAGNOSIS — Z510 Encounter for antineoplastic radiation therapy: Secondary | ICD-10-CM | POA: Diagnosis not present

## 2021-02-06 DIAGNOSIS — Z510 Encounter for antineoplastic radiation therapy: Secondary | ICD-10-CM | POA: Diagnosis not present

## 2021-02-07 ENCOUNTER — Ambulatory Visit: Payer: Medicare Other | Admitting: Orthopedic Surgery

## 2021-02-07 DIAGNOSIS — Z510 Encounter for antineoplastic radiation therapy: Secondary | ICD-10-CM | POA: Diagnosis not present

## 2021-02-08 DIAGNOSIS — Z510 Encounter for antineoplastic radiation therapy: Secondary | ICD-10-CM | POA: Diagnosis not present

## 2021-02-11 DIAGNOSIS — Z510 Encounter for antineoplastic radiation therapy: Secondary | ICD-10-CM | POA: Diagnosis not present

## 2021-02-12 DIAGNOSIS — S82841A Displaced bimalleolar fracture of right lower leg, initial encounter for closed fracture: Secondary | ICD-10-CM | POA: Diagnosis not present

## 2021-02-12 DIAGNOSIS — M25572 Pain in left ankle and joints of left foot: Secondary | ICD-10-CM | POA: Diagnosis not present

## 2021-02-12 DIAGNOSIS — M25571 Pain in right ankle and joints of right foot: Secondary | ICD-10-CM | POA: Diagnosis not present

## 2021-02-12 DIAGNOSIS — M7731 Calcaneal spur, right foot: Secondary | ICD-10-CM | POA: Diagnosis not present

## 2021-02-12 DIAGNOSIS — M7732 Calcaneal spur, left foot: Secondary | ICD-10-CM | POA: Diagnosis not present

## 2021-02-12 DIAGNOSIS — W010XXA Fall on same level from slipping, tripping and stumbling without subsequent striking against object, initial encounter: Secondary | ICD-10-CM | POA: Diagnosis not present

## 2021-02-12 DIAGNOSIS — M19072 Primary osteoarthritis, left ankle and foot: Secondary | ICD-10-CM | POA: Diagnosis not present

## 2021-02-12 DIAGNOSIS — M7989 Other specified soft tissue disorders: Secondary | ICD-10-CM | POA: Diagnosis not present

## 2021-02-13 DIAGNOSIS — Z510 Encounter for antineoplastic radiation therapy: Secondary | ICD-10-CM | POA: Diagnosis not present

## 2021-02-14 DIAGNOSIS — Z510 Encounter for antineoplastic radiation therapy: Secondary | ICD-10-CM | POA: Diagnosis not present

## 2021-02-15 DIAGNOSIS — Z510 Encounter for antineoplastic radiation therapy: Secondary | ICD-10-CM | POA: Diagnosis not present

## 2021-02-18 DIAGNOSIS — Z510 Encounter for antineoplastic radiation therapy: Secondary | ICD-10-CM | POA: Diagnosis not present

## 2021-02-19 DIAGNOSIS — R2689 Other abnormalities of gait and mobility: Secondary | ICD-10-CM | POA: Diagnosis not present

## 2021-02-19 DIAGNOSIS — Z510 Encounter for antineoplastic radiation therapy: Secondary | ICD-10-CM | POA: Diagnosis not present

## 2021-02-19 DIAGNOSIS — M6281 Muscle weakness (generalized): Secondary | ICD-10-CM | POA: Diagnosis not present

## 2021-02-20 DIAGNOSIS — Z510 Encounter for antineoplastic radiation therapy: Secondary | ICD-10-CM | POA: Diagnosis not present

## 2021-02-21 DIAGNOSIS — Z510 Encounter for antineoplastic radiation therapy: Secondary | ICD-10-CM | POA: Diagnosis not present

## 2021-02-22 DIAGNOSIS — Z510 Encounter for antineoplastic radiation therapy: Secondary | ICD-10-CM | POA: Diagnosis not present

## 2021-02-25 DIAGNOSIS — S82892S Other fracture of left lower leg, sequela: Secondary | ICD-10-CM | POA: Diagnosis not present

## 2021-02-25 DIAGNOSIS — I10 Essential (primary) hypertension: Secondary | ICD-10-CM | POA: Diagnosis not present

## 2021-02-25 DIAGNOSIS — F17290 Nicotine dependence, other tobacco product, uncomplicated: Secondary | ICD-10-CM | POA: Diagnosis not present

## 2021-02-25 DIAGNOSIS — Z510 Encounter for antineoplastic radiation therapy: Secondary | ICD-10-CM | POA: Diagnosis not present

## 2021-02-25 DIAGNOSIS — E079 Disorder of thyroid, unspecified: Secondary | ICD-10-CM | POA: Diagnosis not present

## 2021-02-25 DIAGNOSIS — R7303 Prediabetes: Secondary | ICD-10-CM | POA: Diagnosis not present

## 2021-02-25 DIAGNOSIS — S92192G Other fracture of left talus, subsequent encounter for fracture with delayed healing: Secondary | ICD-10-CM | POA: Diagnosis not present

## 2021-02-25 DIAGNOSIS — S82891S Other fracture of right lower leg, sequela: Secondary | ICD-10-CM | POA: Diagnosis not present

## 2021-02-25 DIAGNOSIS — Z01818 Encounter for other preprocedural examination: Secondary | ICD-10-CM | POA: Diagnosis not present

## 2021-02-25 DIAGNOSIS — M353 Polymyalgia rheumatica: Secondary | ICD-10-CM | POA: Diagnosis not present

## 2021-02-25 DIAGNOSIS — S82841A Displaced bimalleolar fracture of right lower leg, initial encounter for closed fracture: Secondary | ICD-10-CM | POA: Diagnosis not present

## 2021-02-26 DIAGNOSIS — Z510 Encounter for antineoplastic radiation therapy: Secondary | ICD-10-CM | POA: Diagnosis not present

## 2021-02-27 DIAGNOSIS — Z510 Encounter for antineoplastic radiation therapy: Secondary | ICD-10-CM | POA: Diagnosis not present

## 2021-02-28 DIAGNOSIS — Z510 Encounter for antineoplastic radiation therapy: Secondary | ICD-10-CM | POA: Diagnosis not present

## 2021-03-01 DIAGNOSIS — W1839XD Other fall on same level, subsequent encounter: Secondary | ICD-10-CM | POA: Diagnosis not present

## 2021-03-01 DIAGNOSIS — C7951 Secondary malignant neoplasm of bone: Secondary | ICD-10-CM | POA: Diagnosis not present

## 2021-03-01 DIAGNOSIS — S82841A Displaced bimalleolar fracture of right lower leg, initial encounter for closed fracture: Secondary | ICD-10-CM | POA: Diagnosis not present

## 2021-03-01 DIAGNOSIS — S82841D Displaced bimalleolar fracture of right lower leg, subsequent encounter for closed fracture with routine healing: Secondary | ICD-10-CM | POA: Diagnosis not present

## 2021-03-01 DIAGNOSIS — M899 Disorder of bone, unspecified: Secondary | ICD-10-CM | POA: Diagnosis not present

## 2021-03-01 DIAGNOSIS — F17290 Nicotine dependence, other tobacco product, uncomplicated: Secondary | ICD-10-CM | POA: Diagnosis not present

## 2021-03-04 ENCOUNTER — Other Ambulatory Visit (INDEPENDENT_AMBULATORY_CARE_PROVIDER_SITE_OTHER): Payer: Self-pay | Admitting: Internal Medicine

## 2021-03-04 ENCOUNTER — Other Ambulatory Visit (INDEPENDENT_AMBULATORY_CARE_PROVIDER_SITE_OTHER): Payer: Self-pay | Admitting: Nurse Practitioner

## 2021-03-04 DIAGNOSIS — G4700 Insomnia, unspecified: Secondary | ICD-10-CM | POA: Diagnosis not present

## 2021-03-04 DIAGNOSIS — R2689 Other abnormalities of gait and mobility: Secondary | ICD-10-CM | POA: Diagnosis not present

## 2021-03-04 DIAGNOSIS — E538 Deficiency of other specified B group vitamins: Secondary | ICD-10-CM | POA: Diagnosis not present

## 2021-03-04 DIAGNOSIS — G43711 Chronic migraine without aura, intractable, with status migrainosus: Secondary | ICD-10-CM | POA: Diagnosis not present

## 2021-03-04 DIAGNOSIS — E1140 Type 2 diabetes mellitus with diabetic neuropathy, unspecified: Secondary | ICD-10-CM | POA: Diagnosis not present

## 2021-03-04 DIAGNOSIS — I951 Orthostatic hypotension: Secondary | ICD-10-CM | POA: Diagnosis not present

## 2021-03-06 DIAGNOSIS — I350 Nonrheumatic aortic (valve) stenosis: Secondary | ICD-10-CM | POA: Diagnosis not present

## 2021-03-06 DIAGNOSIS — Z8673 Personal history of transient ischemic attack (TIA), and cerebral infarction without residual deficits: Secondary | ICD-10-CM | POA: Diagnosis not present

## 2021-03-06 DIAGNOSIS — G8918 Other acute postprocedural pain: Secondary | ICD-10-CM | POA: Diagnosis not present

## 2021-03-06 DIAGNOSIS — E1122 Type 2 diabetes mellitus with diabetic chronic kidney disease: Secondary | ICD-10-CM | POA: Diagnosis not present

## 2021-03-06 DIAGNOSIS — R768 Other specified abnormal immunological findings in serum: Secondary | ICD-10-CM | POA: Diagnosis not present

## 2021-03-06 DIAGNOSIS — S82841A Displaced bimalleolar fracture of right lower leg, initial encounter for closed fracture: Secondary | ICD-10-CM | POA: Diagnosis not present

## 2021-03-06 DIAGNOSIS — S93421A Sprain of deltoid ligament of right ankle, initial encounter: Secondary | ICD-10-CM | POA: Diagnosis not present

## 2021-03-06 DIAGNOSIS — F17210 Nicotine dependence, cigarettes, uncomplicated: Secondary | ICD-10-CM | POA: Diagnosis not present

## 2021-03-06 DIAGNOSIS — Z20822 Contact with and (suspected) exposure to covid-19: Secondary | ICD-10-CM | POA: Diagnosis not present

## 2021-03-06 DIAGNOSIS — G4733 Obstructive sleep apnea (adult) (pediatric): Secondary | ICD-10-CM | POA: Diagnosis not present

## 2021-03-06 DIAGNOSIS — N189 Chronic kidney disease, unspecified: Secondary | ICD-10-CM | POA: Diagnosis not present

## 2021-03-06 DIAGNOSIS — Z791 Long term (current) use of non-steroidal anti-inflammatories (NSAID): Secondary | ICD-10-CM | POA: Diagnosis not present

## 2021-03-06 DIAGNOSIS — C7951 Secondary malignant neoplasm of bone: Secondary | ICD-10-CM | POA: Diagnosis not present

## 2021-03-06 DIAGNOSIS — S93431A Sprain of tibiofibular ligament of right ankle, initial encounter: Secondary | ICD-10-CM | POA: Diagnosis not present

## 2021-03-06 DIAGNOSIS — Z79899 Other long term (current) drug therapy: Secondary | ICD-10-CM | POA: Diagnosis not present

## 2021-03-06 DIAGNOSIS — I129 Hypertensive chronic kidney disease with stage 1 through stage 4 chronic kidney disease, or unspecified chronic kidney disease: Secondary | ICD-10-CM | POA: Diagnosis not present

## 2021-03-06 DIAGNOSIS — Z794 Long term (current) use of insulin: Secondary | ICD-10-CM | POA: Diagnosis not present

## 2021-03-07 DIAGNOSIS — I350 Nonrheumatic aortic (valve) stenosis: Secondary | ICD-10-CM | POA: Diagnosis not present

## 2021-03-07 DIAGNOSIS — C7951 Secondary malignant neoplasm of bone: Secondary | ICD-10-CM | POA: Diagnosis not present

## 2021-03-07 DIAGNOSIS — S93421A Sprain of deltoid ligament of right ankle, initial encounter: Secondary | ICD-10-CM | POA: Diagnosis not present

## 2021-03-07 DIAGNOSIS — W1839XD Other fall on same level, subsequent encounter: Secondary | ICD-10-CM | POA: Diagnosis not present

## 2021-03-07 DIAGNOSIS — S82841A Displaced bimalleolar fracture of right lower leg, initial encounter for closed fracture: Secondary | ICD-10-CM | POA: Diagnosis not present

## 2021-03-07 DIAGNOSIS — Z20822 Contact with and (suspected) exposure to covid-19: Secondary | ICD-10-CM | POA: Diagnosis not present

## 2021-03-07 DIAGNOSIS — M19071 Primary osteoarthritis, right ankle and foot: Secondary | ICD-10-CM | POA: Diagnosis not present

## 2021-03-07 DIAGNOSIS — G8918 Other acute postprocedural pain: Secondary | ICD-10-CM | POA: Diagnosis not present

## 2021-03-07 DIAGNOSIS — E1122 Type 2 diabetes mellitus with diabetic chronic kidney disease: Secondary | ICD-10-CM | POA: Diagnosis not present

## 2021-03-07 DIAGNOSIS — Z79899 Other long term (current) drug therapy: Secondary | ICD-10-CM | POA: Diagnosis not present

## 2021-03-07 DIAGNOSIS — S93431A Sprain of tibiofibular ligament of right ankle, initial encounter: Secondary | ICD-10-CM | POA: Diagnosis not present

## 2021-03-07 DIAGNOSIS — N189 Chronic kidney disease, unspecified: Secondary | ICD-10-CM | POA: Diagnosis not present

## 2021-03-07 DIAGNOSIS — Z791 Long term (current) use of non-steroidal anti-inflammatories (NSAID): Secondary | ICD-10-CM | POA: Diagnosis not present

## 2021-03-07 DIAGNOSIS — F17210 Nicotine dependence, cigarettes, uncomplicated: Secondary | ICD-10-CM | POA: Diagnosis not present

## 2021-03-07 DIAGNOSIS — G4733 Obstructive sleep apnea (adult) (pediatric): Secondary | ICD-10-CM | POA: Diagnosis not present

## 2021-03-07 DIAGNOSIS — S82841D Displaced bimalleolar fracture of right lower leg, subsequent encounter for closed fracture with routine healing: Secondary | ICD-10-CM | POA: Diagnosis not present

## 2021-03-07 DIAGNOSIS — Z8673 Personal history of transient ischemic attack (TIA), and cerebral infarction without residual deficits: Secondary | ICD-10-CM | POA: Diagnosis not present

## 2021-03-07 DIAGNOSIS — I129 Hypertensive chronic kidney disease with stage 1 through stage 4 chronic kidney disease, or unspecified chronic kidney disease: Secondary | ICD-10-CM | POA: Diagnosis not present

## 2021-03-07 DIAGNOSIS — Z794 Long term (current) use of insulin: Secondary | ICD-10-CM | POA: Diagnosis not present

## 2021-03-08 DIAGNOSIS — Z79899 Other long term (current) drug therapy: Secondary | ICD-10-CM | POA: Diagnosis not present

## 2021-03-08 DIAGNOSIS — F17210 Nicotine dependence, cigarettes, uncomplicated: Secondary | ICD-10-CM | POA: Diagnosis not present

## 2021-03-08 DIAGNOSIS — S93431A Sprain of tibiofibular ligament of right ankle, initial encounter: Secondary | ICD-10-CM | POA: Diagnosis not present

## 2021-03-08 DIAGNOSIS — E139 Other specified diabetes mellitus without complications: Secondary | ICD-10-CM | POA: Diagnosis not present

## 2021-03-08 DIAGNOSIS — M179 Osteoarthritis of knee, unspecified: Secondary | ICD-10-CM | POA: Diagnosis not present

## 2021-03-08 DIAGNOSIS — N189 Chronic kidney disease, unspecified: Secondary | ICD-10-CM | POA: Diagnosis not present

## 2021-03-08 DIAGNOSIS — S93421A Sprain of deltoid ligament of right ankle, initial encounter: Secondary | ICD-10-CM | POA: Diagnosis not present

## 2021-03-08 DIAGNOSIS — C7951 Secondary malignant neoplasm of bone: Secondary | ICD-10-CM | POA: Diagnosis not present

## 2021-03-08 DIAGNOSIS — G8918 Other acute postprocedural pain: Secondary | ICD-10-CM | POA: Diagnosis not present

## 2021-03-08 DIAGNOSIS — Z8673 Personal history of transient ischemic attack (TIA), and cerebral infarction without residual deficits: Secondary | ICD-10-CM | POA: Diagnosis not present

## 2021-03-08 DIAGNOSIS — R269 Unspecified abnormalities of gait and mobility: Secondary | ICD-10-CM | POA: Diagnosis not present

## 2021-03-08 DIAGNOSIS — Z794 Long term (current) use of insulin: Secondary | ICD-10-CM | POA: Diagnosis not present

## 2021-03-08 DIAGNOSIS — I350 Nonrheumatic aortic (valve) stenosis: Secondary | ICD-10-CM | POA: Diagnosis not present

## 2021-03-08 DIAGNOSIS — M6281 Muscle weakness (generalized): Secondary | ICD-10-CM | POA: Diagnosis not present

## 2021-03-08 DIAGNOSIS — Z20822 Contact with and (suspected) exposure to covid-19: Secondary | ICD-10-CM | POA: Diagnosis not present

## 2021-03-08 DIAGNOSIS — G4733 Obstructive sleep apnea (adult) (pediatric): Secondary | ICD-10-CM | POA: Diagnosis not present

## 2021-03-08 DIAGNOSIS — I129 Hypertensive chronic kidney disease with stage 1 through stage 4 chronic kidney disease, or unspecified chronic kidney disease: Secondary | ICD-10-CM | POA: Diagnosis not present

## 2021-03-08 DIAGNOSIS — S82841A Displaced bimalleolar fracture of right lower leg, initial encounter for closed fracture: Secondary | ICD-10-CM | POA: Diagnosis not present

## 2021-03-08 DIAGNOSIS — E1122 Type 2 diabetes mellitus with diabetic chronic kidney disease: Secondary | ICD-10-CM | POA: Diagnosis not present

## 2021-03-08 DIAGNOSIS — Z791 Long term (current) use of non-steroidal anti-inflammatories (NSAID): Secondary | ICD-10-CM | POA: Diagnosis not present

## 2021-03-09 DIAGNOSIS — G4733 Obstructive sleep apnea (adult) (pediatric): Secondary | ICD-10-CM | POA: Diagnosis not present

## 2021-03-09 DIAGNOSIS — Z8673 Personal history of transient ischemic attack (TIA), and cerebral infarction without residual deficits: Secondary | ICD-10-CM | POA: Diagnosis not present

## 2021-03-09 DIAGNOSIS — Z794 Long term (current) use of insulin: Secondary | ICD-10-CM | POA: Diagnosis not present

## 2021-03-09 DIAGNOSIS — S93431A Sprain of tibiofibular ligament of right ankle, initial encounter: Secondary | ICD-10-CM | POA: Diagnosis not present

## 2021-03-09 DIAGNOSIS — Z79899 Other long term (current) drug therapy: Secondary | ICD-10-CM | POA: Diagnosis not present

## 2021-03-09 DIAGNOSIS — S93421A Sprain of deltoid ligament of right ankle, initial encounter: Secondary | ICD-10-CM | POA: Diagnosis not present

## 2021-03-09 DIAGNOSIS — N189 Chronic kidney disease, unspecified: Secondary | ICD-10-CM | POA: Diagnosis not present

## 2021-03-09 DIAGNOSIS — I129 Hypertensive chronic kidney disease with stage 1 through stage 4 chronic kidney disease, or unspecified chronic kidney disease: Secondary | ICD-10-CM | POA: Diagnosis not present

## 2021-03-09 DIAGNOSIS — S82841A Displaced bimalleolar fracture of right lower leg, initial encounter for closed fracture: Secondary | ICD-10-CM | POA: Diagnosis not present

## 2021-03-09 DIAGNOSIS — Z20822 Contact with and (suspected) exposure to covid-19: Secondary | ICD-10-CM | POA: Diagnosis not present

## 2021-03-09 DIAGNOSIS — G8918 Other acute postprocedural pain: Secondary | ICD-10-CM | POA: Diagnosis not present

## 2021-03-09 DIAGNOSIS — E1122 Type 2 diabetes mellitus with diabetic chronic kidney disease: Secondary | ICD-10-CM | POA: Diagnosis not present

## 2021-03-09 DIAGNOSIS — Z791 Long term (current) use of non-steroidal anti-inflammatories (NSAID): Secondary | ICD-10-CM | POA: Diagnosis not present

## 2021-03-09 DIAGNOSIS — F17210 Nicotine dependence, cigarettes, uncomplicated: Secondary | ICD-10-CM | POA: Diagnosis not present

## 2021-03-09 DIAGNOSIS — I350 Nonrheumatic aortic (valve) stenosis: Secondary | ICD-10-CM | POA: Diagnosis not present

## 2021-03-09 DIAGNOSIS — C7951 Secondary malignant neoplasm of bone: Secondary | ICD-10-CM | POA: Diagnosis not present

## 2021-03-10 DIAGNOSIS — Z8673 Personal history of transient ischemic attack (TIA), and cerebral infarction without residual deficits: Secondary | ICD-10-CM | POA: Diagnosis not present

## 2021-03-10 DIAGNOSIS — S82841A Displaced bimalleolar fracture of right lower leg, initial encounter for closed fracture: Secondary | ICD-10-CM | POA: Diagnosis not present

## 2021-03-10 DIAGNOSIS — Z794 Long term (current) use of insulin: Secondary | ICD-10-CM | POA: Diagnosis not present

## 2021-03-10 DIAGNOSIS — S93421A Sprain of deltoid ligament of right ankle, initial encounter: Secondary | ICD-10-CM | POA: Diagnosis not present

## 2021-03-10 DIAGNOSIS — G8918 Other acute postprocedural pain: Secondary | ICD-10-CM | POA: Diagnosis not present

## 2021-03-10 DIAGNOSIS — Z20822 Contact with and (suspected) exposure to covid-19: Secondary | ICD-10-CM | POA: Diagnosis not present

## 2021-03-10 DIAGNOSIS — F17210 Nicotine dependence, cigarettes, uncomplicated: Secondary | ICD-10-CM | POA: Diagnosis not present

## 2021-03-10 DIAGNOSIS — I350 Nonrheumatic aortic (valve) stenosis: Secondary | ICD-10-CM | POA: Diagnosis not present

## 2021-03-10 DIAGNOSIS — I129 Hypertensive chronic kidney disease with stage 1 through stage 4 chronic kidney disease, or unspecified chronic kidney disease: Secondary | ICD-10-CM | POA: Diagnosis not present

## 2021-03-10 DIAGNOSIS — E1122 Type 2 diabetes mellitus with diabetic chronic kidney disease: Secondary | ICD-10-CM | POA: Diagnosis not present

## 2021-03-10 DIAGNOSIS — C7951 Secondary malignant neoplasm of bone: Secondary | ICD-10-CM | POA: Diagnosis not present

## 2021-03-10 DIAGNOSIS — N189 Chronic kidney disease, unspecified: Secondary | ICD-10-CM | POA: Diagnosis not present

## 2021-03-10 DIAGNOSIS — Z79899 Other long term (current) drug therapy: Secondary | ICD-10-CM | POA: Diagnosis not present

## 2021-03-10 DIAGNOSIS — S93431A Sprain of tibiofibular ligament of right ankle, initial encounter: Secondary | ICD-10-CM | POA: Diagnosis not present

## 2021-03-10 DIAGNOSIS — G4733 Obstructive sleep apnea (adult) (pediatric): Secondary | ICD-10-CM | POA: Diagnosis not present

## 2021-03-10 DIAGNOSIS — Z791 Long term (current) use of non-steroidal anti-inflammatories (NSAID): Secondary | ICD-10-CM | POA: Diagnosis not present

## 2021-03-11 DIAGNOSIS — S93421A Sprain of deltoid ligament of right ankle, initial encounter: Secondary | ICD-10-CM | POA: Diagnosis not present

## 2021-03-11 DIAGNOSIS — Z79899 Other long term (current) drug therapy: Secondary | ICD-10-CM | POA: Diagnosis not present

## 2021-03-11 DIAGNOSIS — I129 Hypertensive chronic kidney disease with stage 1 through stage 4 chronic kidney disease, or unspecified chronic kidney disease: Secondary | ICD-10-CM | POA: Diagnosis not present

## 2021-03-11 DIAGNOSIS — Z8673 Personal history of transient ischemic attack (TIA), and cerebral infarction without residual deficits: Secondary | ICD-10-CM | POA: Diagnosis not present

## 2021-03-11 DIAGNOSIS — Z794 Long term (current) use of insulin: Secondary | ICD-10-CM | POA: Diagnosis not present

## 2021-03-11 DIAGNOSIS — N189 Chronic kidney disease, unspecified: Secondary | ICD-10-CM | POA: Diagnosis not present

## 2021-03-11 DIAGNOSIS — Z20822 Contact with and (suspected) exposure to covid-19: Secondary | ICD-10-CM | POA: Diagnosis not present

## 2021-03-11 DIAGNOSIS — S93431A Sprain of tibiofibular ligament of right ankle, initial encounter: Secondary | ICD-10-CM | POA: Diagnosis not present

## 2021-03-11 DIAGNOSIS — G8918 Other acute postprocedural pain: Secondary | ICD-10-CM | POA: Diagnosis not present

## 2021-03-11 DIAGNOSIS — G4733 Obstructive sleep apnea (adult) (pediatric): Secondary | ICD-10-CM | POA: Diagnosis not present

## 2021-03-11 DIAGNOSIS — Z791 Long term (current) use of non-steroidal anti-inflammatories (NSAID): Secondary | ICD-10-CM | POA: Diagnosis not present

## 2021-03-11 DIAGNOSIS — C7951 Secondary malignant neoplasm of bone: Secondary | ICD-10-CM | POA: Diagnosis not present

## 2021-03-11 DIAGNOSIS — E1122 Type 2 diabetes mellitus with diabetic chronic kidney disease: Secondary | ICD-10-CM | POA: Diagnosis not present

## 2021-03-11 DIAGNOSIS — I350 Nonrheumatic aortic (valve) stenosis: Secondary | ICD-10-CM | POA: Diagnosis not present

## 2021-03-11 DIAGNOSIS — F17210 Nicotine dependence, cigarettes, uncomplicated: Secondary | ICD-10-CM | POA: Diagnosis not present

## 2021-03-11 DIAGNOSIS — S82841A Displaced bimalleolar fracture of right lower leg, initial encounter for closed fracture: Secondary | ICD-10-CM | POA: Diagnosis not present

## 2021-03-12 DIAGNOSIS — I129 Hypertensive chronic kidney disease with stage 1 through stage 4 chronic kidney disease, or unspecified chronic kidney disease: Secondary | ICD-10-CM | POA: Diagnosis not present

## 2021-03-12 DIAGNOSIS — S93431A Sprain of tibiofibular ligament of right ankle, initial encounter: Secondary | ICD-10-CM | POA: Diagnosis not present

## 2021-03-12 DIAGNOSIS — Z791 Long term (current) use of non-steroidal anti-inflammatories (NSAID): Secondary | ICD-10-CM | POA: Diagnosis not present

## 2021-03-12 DIAGNOSIS — I350 Nonrheumatic aortic (valve) stenosis: Secondary | ICD-10-CM | POA: Diagnosis not present

## 2021-03-12 DIAGNOSIS — E1122 Type 2 diabetes mellitus with diabetic chronic kidney disease: Secondary | ICD-10-CM | POA: Diagnosis not present

## 2021-03-12 DIAGNOSIS — S93421A Sprain of deltoid ligament of right ankle, initial encounter: Secondary | ICD-10-CM | POA: Diagnosis not present

## 2021-03-12 DIAGNOSIS — Z79899 Other long term (current) drug therapy: Secondary | ICD-10-CM | POA: Diagnosis not present

## 2021-03-12 DIAGNOSIS — G4733 Obstructive sleep apnea (adult) (pediatric): Secondary | ICD-10-CM | POA: Diagnosis not present

## 2021-03-12 DIAGNOSIS — G8918 Other acute postprocedural pain: Secondary | ICD-10-CM | POA: Diagnosis not present

## 2021-03-12 DIAGNOSIS — C7951 Secondary malignant neoplasm of bone: Secondary | ICD-10-CM | POA: Diagnosis not present

## 2021-03-12 DIAGNOSIS — S82841A Displaced bimalleolar fracture of right lower leg, initial encounter for closed fracture: Secondary | ICD-10-CM | POA: Diagnosis not present

## 2021-03-12 DIAGNOSIS — N189 Chronic kidney disease, unspecified: Secondary | ICD-10-CM | POA: Diagnosis not present

## 2021-03-12 DIAGNOSIS — Z794 Long term (current) use of insulin: Secondary | ICD-10-CM | POA: Diagnosis not present

## 2021-03-12 DIAGNOSIS — Z8673 Personal history of transient ischemic attack (TIA), and cerebral infarction without residual deficits: Secondary | ICD-10-CM | POA: Diagnosis not present

## 2021-03-12 DIAGNOSIS — F17210 Nicotine dependence, cigarettes, uncomplicated: Secondary | ICD-10-CM | POA: Diagnosis not present

## 2021-03-12 DIAGNOSIS — Z20822 Contact with and (suspected) exposure to covid-19: Secondary | ICD-10-CM | POA: Diagnosis not present

## 2021-03-13 DIAGNOSIS — Z8673 Personal history of transient ischemic attack (TIA), and cerebral infarction without residual deficits: Secondary | ICD-10-CM | POA: Diagnosis not present

## 2021-03-13 DIAGNOSIS — R609 Edema, unspecified: Secondary | ICD-10-CM | POA: Diagnosis not present

## 2021-03-13 DIAGNOSIS — Z7409 Other reduced mobility: Secondary | ICD-10-CM | POA: Diagnosis not present

## 2021-03-13 DIAGNOSIS — R5381 Other malaise: Secondary | ICD-10-CM | POA: Diagnosis not present

## 2021-03-13 DIAGNOSIS — Z794 Long term (current) use of insulin: Secondary | ICD-10-CM | POA: Diagnosis not present

## 2021-03-13 DIAGNOSIS — Z743 Need for continuous supervision: Secondary | ICD-10-CM | POA: Diagnosis not present

## 2021-03-13 DIAGNOSIS — Z79899 Other long term (current) drug therapy: Secondary | ICD-10-CM | POA: Diagnosis not present

## 2021-03-13 DIAGNOSIS — R2689 Other abnormalities of gait and mobility: Secondary | ICD-10-CM | POA: Diagnosis not present

## 2021-03-13 DIAGNOSIS — Z20822 Contact with and (suspected) exposure to covid-19: Secondary | ICD-10-CM | POA: Diagnosis not present

## 2021-03-13 DIAGNOSIS — S93421A Sprain of deltoid ligament of right ankle, initial encounter: Secondary | ICD-10-CM | POA: Diagnosis not present

## 2021-03-13 DIAGNOSIS — F17210 Nicotine dependence, cigarettes, uncomplicated: Secondary | ICD-10-CM | POA: Diagnosis not present

## 2021-03-13 DIAGNOSIS — M6281 Muscle weakness (generalized): Secondary | ICD-10-CM | POA: Diagnosis not present

## 2021-03-13 DIAGNOSIS — R296 Repeated falls: Secondary | ICD-10-CM | POA: Diagnosis not present

## 2021-03-13 DIAGNOSIS — I129 Hypertensive chronic kidney disease with stage 1 through stage 4 chronic kidney disease, or unspecified chronic kidney disease: Secondary | ICD-10-CM | POA: Diagnosis not present

## 2021-03-13 DIAGNOSIS — G4700 Insomnia, unspecified: Secondary | ICD-10-CM | POA: Diagnosis not present

## 2021-03-13 DIAGNOSIS — I10 Essential (primary) hypertension: Secondary | ICD-10-CM | POA: Diagnosis not present

## 2021-03-13 DIAGNOSIS — I350 Nonrheumatic aortic (valve) stenosis: Secondary | ICD-10-CM | POA: Diagnosis not present

## 2021-03-13 DIAGNOSIS — E1122 Type 2 diabetes mellitus with diabetic chronic kidney disease: Secondary | ICD-10-CM | POA: Diagnosis not present

## 2021-03-13 DIAGNOSIS — G4733 Obstructive sleep apnea (adult) (pediatric): Secondary | ICD-10-CM | POA: Diagnosis not present

## 2021-03-13 DIAGNOSIS — S93431A Sprain of tibiofibular ligament of right ankle, initial encounter: Secondary | ICD-10-CM | POA: Diagnosis not present

## 2021-03-13 DIAGNOSIS — N189 Chronic kidney disease, unspecified: Secondary | ICD-10-CM | POA: Diagnosis not present

## 2021-03-13 DIAGNOSIS — S82841A Displaced bimalleolar fracture of right lower leg, initial encounter for closed fracture: Secondary | ICD-10-CM | POA: Diagnosis not present

## 2021-03-13 DIAGNOSIS — Z9889 Other specified postprocedural states: Secondary | ICD-10-CM | POA: Diagnosis not present

## 2021-03-13 DIAGNOSIS — M353 Polymyalgia rheumatica: Secondary | ICD-10-CM | POA: Diagnosis not present

## 2021-03-13 DIAGNOSIS — E119 Type 2 diabetes mellitus without complications: Secondary | ICD-10-CM | POA: Diagnosis not present

## 2021-03-13 DIAGNOSIS — S82891A Other fracture of right lower leg, initial encounter for closed fracture: Secondary | ICD-10-CM | POA: Diagnosis not present

## 2021-03-13 DIAGNOSIS — E079 Disorder of thyroid, unspecified: Secondary | ICD-10-CM | POA: Diagnosis not present

## 2021-03-13 DIAGNOSIS — G8918 Other acute postprocedural pain: Secondary | ICD-10-CM | POA: Diagnosis not present

## 2021-03-13 DIAGNOSIS — Z791 Long term (current) use of non-steroidal anti-inflammatories (NSAID): Secondary | ICD-10-CM | POA: Diagnosis not present

## 2021-03-13 DIAGNOSIS — E1140 Type 2 diabetes mellitus with diabetic neuropathy, unspecified: Secondary | ICD-10-CM | POA: Diagnosis not present

## 2021-03-13 DIAGNOSIS — Z789 Other specified health status: Secondary | ICD-10-CM | POA: Diagnosis not present

## 2021-03-13 DIAGNOSIS — Z7189 Other specified counseling: Secondary | ICD-10-CM | POA: Diagnosis not present

## 2021-03-13 DIAGNOSIS — F32A Depression, unspecified: Secondary | ICD-10-CM | POA: Diagnosis not present

## 2021-03-13 DIAGNOSIS — S82841D Displaced bimalleolar fracture of right lower leg, subsequent encounter for closed fracture with routine healing: Secondary | ICD-10-CM | POA: Diagnosis not present

## 2021-03-13 DIAGNOSIS — C7951 Secondary malignant neoplasm of bone: Secondary | ICD-10-CM | POA: Diagnosis not present

## 2021-03-14 DIAGNOSIS — E119 Type 2 diabetes mellitus without complications: Secondary | ICD-10-CM | POA: Diagnosis not present

## 2021-03-14 DIAGNOSIS — S82891A Other fracture of right lower leg, initial encounter for closed fracture: Secondary | ICD-10-CM | POA: Diagnosis not present

## 2021-03-14 DIAGNOSIS — E079 Disorder of thyroid, unspecified: Secondary | ICD-10-CM | POA: Diagnosis not present

## 2021-03-19 ENCOUNTER — Ambulatory Visit (INDEPENDENT_AMBULATORY_CARE_PROVIDER_SITE_OTHER): Payer: Medicare Other | Admitting: Internal Medicine

## 2021-03-19 DIAGNOSIS — R609 Edema, unspecified: Secondary | ICD-10-CM | POA: Diagnosis not present

## 2021-03-19 DIAGNOSIS — S82841D Displaced bimalleolar fracture of right lower leg, subsequent encounter for closed fracture with routine healing: Secondary | ICD-10-CM | POA: Diagnosis not present

## 2021-03-19 DIAGNOSIS — Z9889 Other specified postprocedural states: Secondary | ICD-10-CM | POA: Diagnosis not present

## 2021-03-19 DIAGNOSIS — F17210 Nicotine dependence, cigarettes, uncomplicated: Secondary | ICD-10-CM | POA: Diagnosis not present

## 2021-03-26 ENCOUNTER — Telehealth (INDEPENDENT_AMBULATORY_CARE_PROVIDER_SITE_OTHER): Payer: Self-pay

## 2021-03-26 MED ORDER — PANTOPRAZOLE SODIUM 20 MG PO TBEC: 20.0000 mg | DELAYED_RELEASE_TABLET | Freq: Every day | ORAL | 2 refills | Status: DC

## 2021-03-26 NOTE — Telephone Encounter (Signed)
Jared Bridges, CMA  

## 2021-03-31 DIAGNOSIS — S82841D Displaced bimalleolar fracture of right lower leg, subsequent encounter for closed fracture with routine healing: Secondary | ICD-10-CM | POA: Diagnosis not present

## 2021-03-31 DIAGNOSIS — E1122 Type 2 diabetes mellitus with diabetic chronic kidney disease: Secondary | ICD-10-CM | POA: Diagnosis not present

## 2021-03-31 DIAGNOSIS — I129 Hypertensive chronic kidney disease with stage 1 through stage 4 chronic kidney disease, or unspecified chronic kidney disease: Secondary | ICD-10-CM | POA: Diagnosis not present

## 2021-03-31 DIAGNOSIS — E079 Disorder of thyroid, unspecified: Secondary | ICD-10-CM | POA: Diagnosis not present

## 2021-03-31 DIAGNOSIS — I350 Nonrheumatic aortic (valve) stenosis: Secondary | ICD-10-CM | POA: Diagnosis not present

## 2021-03-31 DIAGNOSIS — I499 Cardiac arrhythmia, unspecified: Secondary | ICD-10-CM | POA: Diagnosis not present

## 2021-03-31 DIAGNOSIS — M353 Polymyalgia rheumatica: Secondary | ICD-10-CM | POA: Diagnosis not present

## 2021-03-31 DIAGNOSIS — W19XXXD Unspecified fall, subsequent encounter: Secondary | ICD-10-CM | POA: Diagnosis not present

## 2021-03-31 DIAGNOSIS — F32A Depression, unspecified: Secondary | ICD-10-CM | POA: Diagnosis not present

## 2021-03-31 DIAGNOSIS — N189 Chronic kidney disease, unspecified: Secondary | ICD-10-CM | POA: Diagnosis not present

## 2021-03-31 DIAGNOSIS — H9193 Unspecified hearing loss, bilateral: Secondary | ICD-10-CM | POA: Diagnosis not present

## 2021-04-02 ENCOUNTER — Telehealth (INDEPENDENT_AMBULATORY_CARE_PROVIDER_SITE_OTHER): Payer: Self-pay

## 2021-04-02 NOTE — Telephone Encounter (Signed)
Yes, this is okay.

## 2021-04-02 NOTE — Telephone Encounter (Signed)
Christy with [employer] called and left a message to ask for approval for the following:  Skilled nursing and home health aide services for 2 times a week for 3 weeks  Please advise if OK?  Alyse Low [phone] Office [phone]

## 2021-04-02 NOTE — Telephone Encounter (Signed)
[place] and left a detailed voice message for Alyse Low and gave her the verbal OK per Dr. Anastasio Champion.

## 2021-04-03 DIAGNOSIS — S82841D Displaced bimalleolar fracture of right lower leg, subsequent encounter for closed fracture with routine healing: Secondary | ICD-10-CM | POA: Diagnosis not present

## 2021-04-03 DIAGNOSIS — N189 Chronic kidney disease, unspecified: Secondary | ICD-10-CM | POA: Diagnosis not present

## 2021-04-03 DIAGNOSIS — F32A Depression, unspecified: Secondary | ICD-10-CM | POA: Diagnosis not present

## 2021-04-03 DIAGNOSIS — W19XXXD Unspecified fall, subsequent encounter: Secondary | ICD-10-CM | POA: Diagnosis not present

## 2021-04-03 DIAGNOSIS — E1122 Type 2 diabetes mellitus with diabetic chronic kidney disease: Secondary | ICD-10-CM | POA: Diagnosis not present

## 2021-04-03 DIAGNOSIS — M353 Polymyalgia rheumatica: Secondary | ICD-10-CM | POA: Diagnosis not present

## 2021-04-03 DIAGNOSIS — E079 Disorder of thyroid, unspecified: Secondary | ICD-10-CM | POA: Diagnosis not present

## 2021-04-03 DIAGNOSIS — I129 Hypertensive chronic kidney disease with stage 1 through stage 4 chronic kidney disease, or unspecified chronic kidney disease: Secondary | ICD-10-CM | POA: Diagnosis not present

## 2021-04-03 DIAGNOSIS — I499 Cardiac arrhythmia, unspecified: Secondary | ICD-10-CM | POA: Diagnosis not present

## 2021-04-03 DIAGNOSIS — H9193 Unspecified hearing loss, bilateral: Secondary | ICD-10-CM | POA: Diagnosis not present

## 2021-04-03 DIAGNOSIS — I350 Nonrheumatic aortic (valve) stenosis: Secondary | ICD-10-CM | POA: Diagnosis not present

## 2021-04-08 DIAGNOSIS — E079 Disorder of thyroid, unspecified: Secondary | ICD-10-CM | POA: Diagnosis not present

## 2021-04-08 DIAGNOSIS — E1122 Type 2 diabetes mellitus with diabetic chronic kidney disease: Secondary | ICD-10-CM | POA: Diagnosis not present

## 2021-04-08 DIAGNOSIS — H9193 Unspecified hearing loss, bilateral: Secondary | ICD-10-CM | POA: Diagnosis not present

## 2021-04-08 DIAGNOSIS — M353 Polymyalgia rheumatica: Secondary | ICD-10-CM | POA: Diagnosis not present

## 2021-04-08 DIAGNOSIS — I350 Nonrheumatic aortic (valve) stenosis: Secondary | ICD-10-CM | POA: Diagnosis not present

## 2021-04-08 DIAGNOSIS — I129 Hypertensive chronic kidney disease with stage 1 through stage 4 chronic kidney disease, or unspecified chronic kidney disease: Secondary | ICD-10-CM | POA: Diagnosis not present

## 2021-04-08 DIAGNOSIS — F32A Depression, unspecified: Secondary | ICD-10-CM | POA: Diagnosis not present

## 2021-04-08 DIAGNOSIS — I499 Cardiac arrhythmia, unspecified: Secondary | ICD-10-CM | POA: Diagnosis not present

## 2021-04-08 DIAGNOSIS — S82841D Displaced bimalleolar fracture of right lower leg, subsequent encounter for closed fracture with routine healing: Secondary | ICD-10-CM | POA: Diagnosis not present

## 2021-04-08 DIAGNOSIS — W19XXXD Unspecified fall, subsequent encounter: Secondary | ICD-10-CM | POA: Diagnosis not present

## 2021-04-08 DIAGNOSIS — N189 Chronic kidney disease, unspecified: Secondary | ICD-10-CM | POA: Diagnosis not present

## 2021-04-11 DIAGNOSIS — Z9049 Acquired absence of other specified parts of digestive tract: Secondary | ICD-10-CM | POA: Diagnosis not present

## 2021-04-11 DIAGNOSIS — R3912 Poor urinary stream: Secondary | ICD-10-CM | POA: Diagnosis not present

## 2021-04-11 DIAGNOSIS — Z923 Personal history of irradiation: Secondary | ICD-10-CM | POA: Diagnosis not present

## 2021-04-11 DIAGNOSIS — F17210 Nicotine dependence, cigarettes, uncomplicated: Secondary | ICD-10-CM | POA: Diagnosis not present

## 2021-04-11 DIAGNOSIS — Z79899 Other long term (current) drug therapy: Secondary | ICD-10-CM | POA: Diagnosis not present

## 2021-04-11 DIAGNOSIS — R42 Dizziness and giddiness: Secondary | ICD-10-CM | POA: Diagnosis not present

## 2021-04-12 DIAGNOSIS — F32A Depression, unspecified: Secondary | ICD-10-CM | POA: Diagnosis not present

## 2021-04-12 DIAGNOSIS — E079 Disorder of thyroid, unspecified: Secondary | ICD-10-CM | POA: Diagnosis not present

## 2021-04-12 DIAGNOSIS — W19XXXD Unspecified fall, subsequent encounter: Secondary | ICD-10-CM | POA: Diagnosis not present

## 2021-04-12 DIAGNOSIS — E1122 Type 2 diabetes mellitus with diabetic chronic kidney disease: Secondary | ICD-10-CM | POA: Diagnosis not present

## 2021-04-12 DIAGNOSIS — M353 Polymyalgia rheumatica: Secondary | ICD-10-CM | POA: Diagnosis not present

## 2021-04-12 DIAGNOSIS — I499 Cardiac arrhythmia, unspecified: Secondary | ICD-10-CM | POA: Diagnosis not present

## 2021-04-12 DIAGNOSIS — I129 Hypertensive chronic kidney disease with stage 1 through stage 4 chronic kidney disease, or unspecified chronic kidney disease: Secondary | ICD-10-CM | POA: Diagnosis not present

## 2021-04-12 DIAGNOSIS — N189 Chronic kidney disease, unspecified: Secondary | ICD-10-CM | POA: Diagnosis not present

## 2021-04-12 DIAGNOSIS — S82841D Displaced bimalleolar fracture of right lower leg, subsequent encounter for closed fracture with routine healing: Secondary | ICD-10-CM | POA: Diagnosis not present

## 2021-04-12 DIAGNOSIS — H9193 Unspecified hearing loss, bilateral: Secondary | ICD-10-CM | POA: Diagnosis not present

## 2021-04-12 DIAGNOSIS — I350 Nonrheumatic aortic (valve) stenosis: Secondary | ICD-10-CM | POA: Diagnosis not present

## 2021-04-15 DIAGNOSIS — I129 Hypertensive chronic kidney disease with stage 1 through stage 4 chronic kidney disease, or unspecified chronic kidney disease: Secondary | ICD-10-CM | POA: Diagnosis not present

## 2021-04-15 DIAGNOSIS — H9193 Unspecified hearing loss, bilateral: Secondary | ICD-10-CM | POA: Diagnosis not present

## 2021-04-15 DIAGNOSIS — W19XXXD Unspecified fall, subsequent encounter: Secondary | ICD-10-CM | POA: Diagnosis not present

## 2021-04-15 DIAGNOSIS — F32A Depression, unspecified: Secondary | ICD-10-CM | POA: Diagnosis not present

## 2021-04-15 DIAGNOSIS — I350 Nonrheumatic aortic (valve) stenosis: Secondary | ICD-10-CM | POA: Diagnosis not present

## 2021-04-15 DIAGNOSIS — I499 Cardiac arrhythmia, unspecified: Secondary | ICD-10-CM | POA: Diagnosis not present

## 2021-04-15 DIAGNOSIS — S82841D Displaced bimalleolar fracture of right lower leg, subsequent encounter for closed fracture with routine healing: Secondary | ICD-10-CM | POA: Diagnosis not present

## 2021-04-15 DIAGNOSIS — E079 Disorder of thyroid, unspecified: Secondary | ICD-10-CM | POA: Diagnosis not present

## 2021-04-15 DIAGNOSIS — E1122 Type 2 diabetes mellitus with diabetic chronic kidney disease: Secondary | ICD-10-CM | POA: Diagnosis not present

## 2021-04-15 DIAGNOSIS — M353 Polymyalgia rheumatica: Secondary | ICD-10-CM | POA: Diagnosis not present

## 2021-04-15 DIAGNOSIS — N189 Chronic kidney disease, unspecified: Secondary | ICD-10-CM | POA: Diagnosis not present

## 2021-04-18 DIAGNOSIS — I499 Cardiac arrhythmia, unspecified: Secondary | ICD-10-CM | POA: Diagnosis not present

## 2021-04-18 DIAGNOSIS — H9193 Unspecified hearing loss, bilateral: Secondary | ICD-10-CM | POA: Diagnosis not present

## 2021-04-18 DIAGNOSIS — E1122 Type 2 diabetes mellitus with diabetic chronic kidney disease: Secondary | ICD-10-CM | POA: Diagnosis not present

## 2021-04-18 DIAGNOSIS — M353 Polymyalgia rheumatica: Secondary | ICD-10-CM | POA: Diagnosis not present

## 2021-04-18 DIAGNOSIS — I350 Nonrheumatic aortic (valve) stenosis: Secondary | ICD-10-CM | POA: Diagnosis not present

## 2021-04-18 DIAGNOSIS — S82841D Displaced bimalleolar fracture of right lower leg, subsequent encounter for closed fracture with routine healing: Secondary | ICD-10-CM | POA: Diagnosis not present

## 2021-04-18 DIAGNOSIS — N189 Chronic kidney disease, unspecified: Secondary | ICD-10-CM | POA: Diagnosis not present

## 2021-04-18 DIAGNOSIS — E079 Disorder of thyroid, unspecified: Secondary | ICD-10-CM | POA: Diagnosis not present

## 2021-04-18 DIAGNOSIS — I129 Hypertensive chronic kidney disease with stage 1 through stage 4 chronic kidney disease, or unspecified chronic kidney disease: Secondary | ICD-10-CM | POA: Diagnosis not present

## 2021-04-18 DIAGNOSIS — W19XXXD Unspecified fall, subsequent encounter: Secondary | ICD-10-CM | POA: Diagnosis not present

## 2021-04-18 DIAGNOSIS — F32A Depression, unspecified: Secondary | ICD-10-CM | POA: Diagnosis not present

## 2021-04-21 DIAGNOSIS — R2689 Other abnormalities of gait and mobility: Secondary | ICD-10-CM | POA: Diagnosis not present

## 2021-04-21 DIAGNOSIS — M6281 Muscle weakness (generalized): Secondary | ICD-10-CM | POA: Diagnosis not present

## 2021-05-07 DIAGNOSIS — S82841D Displaced bimalleolar fracture of right lower leg, subsequent encounter for closed fracture with routine healing: Secondary | ICD-10-CM | POA: Diagnosis not present

## 2021-05-07 DIAGNOSIS — M7989 Other specified soft tissue disorders: Secondary | ICD-10-CM | POA: Diagnosis not present

## 2021-05-07 DIAGNOSIS — W1839XD Other fall on same level, subsequent encounter: Secondary | ICD-10-CM | POA: Diagnosis not present

## 2021-05-07 DIAGNOSIS — Z720 Tobacco use: Secondary | ICD-10-CM | POA: Diagnosis not present

## 2021-05-13 ENCOUNTER — Ambulatory Visit (INDEPENDENT_AMBULATORY_CARE_PROVIDER_SITE_OTHER): Payer: Medicare Other | Admitting: Internal Medicine

## 2021-05-22 DIAGNOSIS — M6281 Muscle weakness (generalized): Secondary | ICD-10-CM | POA: Diagnosis not present

## 2021-05-22 DIAGNOSIS — R2689 Other abnormalities of gait and mobility: Secondary | ICD-10-CM | POA: Diagnosis not present

## 2021-06-10 ENCOUNTER — Ambulatory Visit (INDEPENDENT_AMBULATORY_CARE_PROVIDER_SITE_OTHER): Payer: Medicare Other | Admitting: Internal Medicine

## 2021-06-11 ENCOUNTER — Other Ambulatory Visit (INDEPENDENT_AMBULATORY_CARE_PROVIDER_SITE_OTHER): Payer: Self-pay | Admitting: Internal Medicine

## 2021-06-18 DIAGNOSIS — S82841D Displaced bimalleolar fracture of right lower leg, subsequent encounter for closed fracture with routine healing: Secondary | ICD-10-CM | POA: Diagnosis not present

## 2021-06-18 DIAGNOSIS — W1839XD Other fall on same level, subsequent encounter: Secondary | ICD-10-CM | POA: Diagnosis not present

## 2021-06-18 DIAGNOSIS — F17290 Nicotine dependence, other tobacco product, uncomplicated: Secondary | ICD-10-CM | POA: Diagnosis not present

## 2021-06-21 DIAGNOSIS — M6281 Muscle weakness (generalized): Secondary | ICD-10-CM | POA: Diagnosis not present

## 2021-06-21 DIAGNOSIS — R2689 Other abnormalities of gait and mobility: Secondary | ICD-10-CM | POA: Diagnosis not present

## 2021-06-24 DIAGNOSIS — C7951 Secondary malignant neoplasm of bone: Secondary | ICD-10-CM | POA: Diagnosis not present

## 2021-06-24 DIAGNOSIS — F17290 Nicotine dependence, other tobacco product, uncomplicated: Secondary | ICD-10-CM | POA: Diagnosis not present

## 2021-06-25 ENCOUNTER — Ambulatory Visit (INDEPENDENT_AMBULATORY_CARE_PROVIDER_SITE_OTHER): Payer: Medicare Other | Admitting: Internal Medicine

## 2021-06-27 DIAGNOSIS — R590 Localized enlarged lymph nodes: Secondary | ICD-10-CM | POA: Diagnosis not present

## 2021-06-27 DIAGNOSIS — Z923 Personal history of irradiation: Secondary | ICD-10-CM | POA: Diagnosis not present

## 2021-07-01 ENCOUNTER — Emergency Department (HOSPITAL_COMMUNITY): Payer: Medicare Other

## 2021-07-01 ENCOUNTER — Encounter (HOSPITAL_COMMUNITY): Payer: Self-pay | Admitting: Emergency Medicine

## 2021-07-01 ENCOUNTER — Other Ambulatory Visit: Payer: Self-pay

## 2021-07-01 ENCOUNTER — Inpatient Hospital Stay (HOSPITAL_COMMUNITY)
Admission: EM | Admit: 2021-07-01 | Discharge: 2021-07-05 | DRG: 083 | Disposition: A | Discharge status: Rehabilitation Facility | Payer: Medicare Other | Attending: Internal Medicine | Admitting: Internal Medicine

## 2021-07-01 ENCOUNTER — Emergency Department (HOSPITAL_COMMUNITY)
Admission: EM | Admit: 2021-07-01 | Discharge: 2021-07-01 | Disposition: A | Discharge status: Home / Self Care | Payer: Medicare Other | Source: Home / Self Care | Attending: Emergency Medicine | Admitting: Emergency Medicine

## 2021-07-01 DIAGNOSIS — S161XXA Strain of muscle, fascia and tendon at neck level, initial encounter: Secondary | ICD-10-CM | POA: Diagnosis not present

## 2021-07-01 DIAGNOSIS — Z923 Personal history of irradiation: Secondary | ICD-10-CM

## 2021-07-01 DIAGNOSIS — Z8711 Personal history of peptic ulcer disease: Secondary | ICD-10-CM | POA: Diagnosis not present

## 2021-07-01 DIAGNOSIS — E1136 Type 2 diabetes mellitus with diabetic cataract: Secondary | ICD-10-CM | POA: Insufficient documentation

## 2021-07-01 DIAGNOSIS — R531 Weakness: Secondary | ICD-10-CM | POA: Diagnosis not present

## 2021-07-01 DIAGNOSIS — M353 Polymyalgia rheumatica: Secondary | ICD-10-CM | POA: Diagnosis not present

## 2021-07-01 DIAGNOSIS — N189 Chronic kidney disease, unspecified: Secondary | ICD-10-CM | POA: Insufficient documentation

## 2021-07-01 DIAGNOSIS — Z91040 Latex allergy status: Secondary | ICD-10-CM | POA: Insufficient documentation

## 2021-07-01 DIAGNOSIS — Z66 Do not resuscitate: Secondary | ICD-10-CM | POA: Diagnosis not present

## 2021-07-01 DIAGNOSIS — Z9049 Acquired absence of other specified parts of digestive tract: Secondary | ICD-10-CM | POA: Diagnosis not present

## 2021-07-01 DIAGNOSIS — H9190 Unspecified hearing loss, unspecified ear: Secondary | ICD-10-CM | POA: Diagnosis not present

## 2021-07-01 DIAGNOSIS — Y92002 Bathroom of unspecified non-institutional (private) residence single-family (private) house as the place of occurrence of the external cause: Secondary | ICD-10-CM | POA: Insufficient documentation

## 2021-07-01 DIAGNOSIS — R0689 Other abnormalities of breathing: Secondary | ICD-10-CM | POA: Diagnosis not present

## 2021-07-01 DIAGNOSIS — I5032 Chronic diastolic (congestive) heart failure: Secondary | ICD-10-CM | POA: Diagnosis present

## 2021-07-01 DIAGNOSIS — E7800 Pure hypercholesterolemia, unspecified: Secondary | ICD-10-CM | POA: Diagnosis present

## 2021-07-01 DIAGNOSIS — S064X0A Epidural hemorrhage without loss of consciousness, initial encounter: Secondary | ICD-10-CM | POA: Diagnosis not present

## 2021-07-01 DIAGNOSIS — I621 Nontraumatic extradural hemorrhage: Secondary | ICD-10-CM | POA: Diagnosis not present

## 2021-07-01 DIAGNOSIS — M542 Cervicalgia: Secondary | ICD-10-CM | POA: Insufficient documentation

## 2021-07-01 DIAGNOSIS — S0003XA Contusion of scalp, initial encounter: Secondary | ICD-10-CM | POA: Diagnosis not present

## 2021-07-01 DIAGNOSIS — E1142 Type 2 diabetes mellitus with diabetic polyneuropathy: Secondary | ICD-10-CM | POA: Insufficient documentation

## 2021-07-01 DIAGNOSIS — Z888 Allergy status to other drugs, medicaments and biological substances status: Secondary | ICD-10-CM

## 2021-07-01 DIAGNOSIS — W19XXXA Unspecified fall, initial encounter: Secondary | ICD-10-CM | POA: Diagnosis not present

## 2021-07-01 DIAGNOSIS — Z96642 Presence of left artificial hip joint: Secondary | ICD-10-CM | POA: Diagnosis present

## 2021-07-01 DIAGNOSIS — S51011A Laceration without foreign body of right elbow, initial encounter: Secondary | ICD-10-CM | POA: Diagnosis present

## 2021-07-01 DIAGNOSIS — I110 Hypertensive heart disease with heart failure: Secondary | ICD-10-CM | POA: Diagnosis not present

## 2021-07-01 DIAGNOSIS — Z8249 Family history of ischemic heart disease and other diseases of the circulatory system: Secondary | ICD-10-CM

## 2021-07-01 DIAGNOSIS — E1165 Type 2 diabetes mellitus with hyperglycemia: Secondary | ICD-10-CM | POA: Diagnosis present

## 2021-07-01 DIAGNOSIS — Z800 Family history of malignant neoplasm of digestive organs: Secondary | ICD-10-CM

## 2021-07-01 DIAGNOSIS — I10 Essential (primary) hypertension: Secondary | ICD-10-CM | POA: Diagnosis not present

## 2021-07-01 DIAGNOSIS — S134XXA Sprain of ligaments of cervical spine, initial encounter: Secondary | ICD-10-CM | POA: Diagnosis not present

## 2021-07-01 DIAGNOSIS — Z79899 Other long term (current) drug therapy: Secondary | ICD-10-CM | POA: Insufficient documentation

## 2021-07-01 DIAGNOSIS — Z8673 Personal history of transient ischemic attack (TIA), and cerebral infarction without residual deficits: Secondary | ICD-10-CM | POA: Insufficient documentation

## 2021-07-01 DIAGNOSIS — Z87891 Personal history of nicotine dependence: Secondary | ICD-10-CM | POA: Insufficient documentation

## 2021-07-01 DIAGNOSIS — I959 Hypotension, unspecified: Secondary | ICD-10-CM | POA: Diagnosis not present

## 2021-07-01 DIAGNOSIS — Z8546 Personal history of malignant neoplasm of prostate: Secondary | ICD-10-CM | POA: Insufficient documentation

## 2021-07-01 DIAGNOSIS — S12500A Unspecified displaced fracture of sixth cervical vertebra, initial encounter for closed fracture: Secondary | ICD-10-CM | POA: Diagnosis not present

## 2021-07-01 DIAGNOSIS — Z96652 Presence of left artificial knee joint: Secondary | ICD-10-CM | POA: Insufficient documentation

## 2021-07-01 DIAGNOSIS — M2578 Osteophyte, vertebrae: Secondary | ICD-10-CM | POA: Diagnosis not present

## 2021-07-01 DIAGNOSIS — S064X9A Epidural hemorrhage with loss of consciousness of unspecified duration, initial encounter: Principal | ICD-10-CM | POA: Diagnosis present

## 2021-07-01 DIAGNOSIS — Z9841 Cataract extraction status, right eye: Secondary | ICD-10-CM

## 2021-07-01 DIAGNOSIS — C61 Malignant neoplasm of prostate: Secondary | ICD-10-CM | POA: Diagnosis present

## 2021-07-01 DIAGNOSIS — W01198A Fall on same level from slipping, tripping and stumbling with subsequent striking against other object, initial encounter: Secondary | ICD-10-CM | POA: Insufficient documentation

## 2021-07-01 DIAGNOSIS — Z818 Family history of other mental and behavioral disorders: Secondary | ICD-10-CM

## 2021-07-01 DIAGNOSIS — S12600A Unspecified displaced fracture of seventh cervical vertebra, initial encounter for closed fracture: Secondary | ICD-10-CM | POA: Diagnosis present

## 2021-07-01 DIAGNOSIS — Z9109 Other allergy status, other than to drugs and biological substances: Secondary | ICD-10-CM

## 2021-07-01 DIAGNOSIS — E872 Acidosis: Secondary | ICD-10-CM | POA: Diagnosis not present

## 2021-07-01 DIAGNOSIS — E1122 Type 2 diabetes mellitus with diabetic chronic kidney disease: Secondary | ICD-10-CM | POA: Insufficient documentation

## 2021-07-01 DIAGNOSIS — S199XXA Unspecified injury of neck, initial encounter: Secondary | ICD-10-CM | POA: Diagnosis not present

## 2021-07-01 DIAGNOSIS — Z961 Presence of intraocular lens: Secondary | ICD-10-CM | POA: Diagnosis present

## 2021-07-01 DIAGNOSIS — I129 Hypertensive chronic kidney disease with stage 1 through stage 4 chronic kidney disease, or unspecified chronic kidney disease: Secondary | ICD-10-CM | POA: Insufficient documentation

## 2021-07-01 DIAGNOSIS — S064XAA Epidural hemorrhage with loss of consciousness status unknown, initial encounter: Secondary | ICD-10-CM | POA: Diagnosis present

## 2021-07-01 DIAGNOSIS — S41111A Laceration without foreign body of right upper arm, initial encounter: Secondary | ICD-10-CM | POA: Insufficient documentation

## 2021-07-01 DIAGNOSIS — W010XXA Fall on same level from slipping, tripping and stumbling without subsequent striking against object, initial encounter: Secondary | ICD-10-CM

## 2021-07-01 DIAGNOSIS — R0902 Hypoxemia: Secondary | ICD-10-CM | POA: Diagnosis not present

## 2021-07-01 DIAGNOSIS — Z743 Need for continuous supervision: Secondary | ICD-10-CM | POA: Diagnosis not present

## 2021-07-01 DIAGNOSIS — N400 Enlarged prostate without lower urinary tract symptoms: Secondary | ICD-10-CM | POA: Diagnosis present

## 2021-07-01 DIAGNOSIS — Z20822 Contact with and (suspected) exposure to covid-19: Secondary | ICD-10-CM | POA: Diagnosis present

## 2021-07-01 DIAGNOSIS — R9431 Abnormal electrocardiogram [ECG] [EKG]: Secondary | ICD-10-CM | POA: Diagnosis not present

## 2021-07-01 DIAGNOSIS — Z91041 Radiographic dye allergy status: Secondary | ICD-10-CM

## 2021-07-01 DIAGNOSIS — C7951 Secondary malignant neoplasm of bone: Secondary | ICD-10-CM | POA: Diagnosis present

## 2021-07-01 DIAGNOSIS — R29898 Other symptoms and signs involving the musculoskeletal system: Secondary | ICD-10-CM

## 2021-07-01 DIAGNOSIS — Z981 Arthrodesis status: Secondary | ICD-10-CM | POA: Diagnosis not present

## 2021-07-01 LAB — COMPREHENSIVE METABOLIC PANEL
ALT: 17 U/L (ref 0–44)
AST: 31 U/L (ref 15–41)
Albumin: 3.8 g/dL (ref 3.5–5.0)
Alkaline Phosphatase: 81 U/L (ref 38–126)
Anion gap: 10 (ref 5–15)
BUN: 21 mg/dL (ref 8–23)
CO2: 19 mmol/L — ABNORMAL LOW (ref 22–32)
Calcium: 8.4 mg/dL — ABNORMAL LOW (ref 8.9–10.3)
Chloride: 109 mmol/L (ref 98–111)
Creatinine, Ser: 1.09 mg/dL (ref 0.61–1.24)
GFR, Estimated: >60 mL/min (ref >60)
Glucose, Bld: 182 mg/dL — ABNORMAL HIGH (ref 70–99)
Potassium: 4.7 mmol/L (ref 3.5–5.1)
Sodium: 138 mmol/L (ref 135–145)
Total Bilirubin: 0.9 mg/dL (ref 0.3–1.2)
Total Protein: 7.4 g/dL (ref 6.5–8.1)

## 2021-07-01 LAB — CBC WITH DIFFERENTIAL/PLATELET
Abs Immature Granulocytes: 0.14 10*3/uL — ABNORMAL HIGH (ref 0.00–0.07)
Basophils Absolute: 0 10*3/uL (ref 0.0–0.1)
Basophils Relative: 0 %
Eosinophils Absolute: 0 10*3/uL (ref 0.0–0.5)
Eosinophils Relative: 0 %
HCT: 37.6 % — ABNORMAL LOW (ref 39.0–52.0)
Hemoglobin: 11.9 g/dL — ABNORMAL LOW (ref 13.0–17.0)
Immature Granulocytes: 1 %
Lymphocytes Relative: 4 %
Lymphs Abs: 0.5 10*3/uL — ABNORMAL LOW (ref 0.7–4.0)
MCH: 31.6 pg (ref 26.0–34.0)
MCHC: 31.6 g/dL (ref 30.0–36.0)
MCV: 99.7 fL (ref 80.0–100.0)
Monocytes Absolute: 0.8 10*3/uL (ref 0.1–1.0)
Monocytes Relative: 7 %
Neutro Abs: 10.7 10*3/uL — ABNORMAL HIGH (ref 1.7–7.7)
Neutrophils Relative %: 88 %
Platelets: 193 10*3/uL (ref 150–400)
RBC: 3.77 MIL/uL — ABNORMAL LOW (ref 4.22–5.81)
RDW: 13.3 % (ref 11.5–15.5)
WBC: 12.2 10*3/uL — ABNORMAL HIGH (ref 4.0–10.5)
nRBC: 0 % (ref 0.0–0.2)

## 2021-07-01 LAB — RESP PANEL BY RT-PCR (FLU A&B, COVID) ARPGX2
Influenza A by PCR: NEGATIVE
Influenza B by PCR: NEGATIVE
SARS Coronavirus 2 by RT PCR: NEGATIVE

## 2021-07-01 LAB — PROTIME-INR
INR: 1.1 (ref 0.8–1.2)
Prothrombin Time: 14.1 seconds (ref 11.4–15.2)

## 2021-07-01 LAB — TSH: TSH: 1.246 u[IU]/mL (ref 0.350–4.500)

## 2021-07-01 MED ORDER — TETANUS-DIPHTH-ACELL PERTUSSIS 5-2.5-18.5 LF-MCG/0.5 IM SUSY
0.5000 mL | PREFILLED_SYRINGE | Freq: Once | INTRAMUSCULAR | Status: DC
  Filled 2021-07-01: qty 0.5

## 2021-07-01 MED ORDER — ACETAMINOPHEN 500 MG PO TABS
1000.0000 mg | ORAL_TABLET | Freq: Once | ORAL | Status: AC
  Administered 2021-07-01: 1000 mg via ORAL
  Filled 2021-07-01: qty 2

## 2021-07-01 NOTE — ED Triage Notes (Signed)
Pt arrived by [facility] for fall. Pt tripped over mat in bathroom floor, denies hitting head.

## 2021-07-01 NOTE — Discharge Instructions (Addendum)
It was our pleasure to provide your ER care today - we hope that you feel better.  Fall precautions.    Take acetaminophen or ibuprofen as need.   Keep abrasion/skin tear very clean.   Follow up with primary care doctor in the next couple weeks.   Return to ER if worse, new symptoms, new or severe pain, numbness/weakness, or other concern.

## 2021-07-01 NOTE — ED Provider Notes (Signed)
[facility] EMERGENCY DEPARTMENT Provider Note   CSN: [id number] Arrival date & time: 07/01/21  1207     History Chief Complaint  Patient presents with   Zollie Scale BEHR SLAYTON is a 87 y.o. male.  Patient c/o slip and fall today. States was in bathroom this AM, slipped on wet floor, hit head. Was asymptomatic prior to fall, no faintness or dizziness. No loc with hall but states hit head hard, dull pain posteriorly and in neck. No radicular pain. No back pain. No numbness/weakness. No chest pain or sob. No abd pain or nv. Skin tear to LUE, small, superficial, last tetanus unknown. Denies other extremity pain or injury. No anticoag use.   The history is provided by the patient and the EMS personnel.  Fall Pertinent negatives include no chest pain, no abdominal pain and no shortness of breath.      Past Medical History:  Diagnosis Date   Anemia, iron deficiency    Chronic headaches    Migraines   Chronic kidney disease    Resolved   Depression    Diverticulitis    Essential hypertension    GI bleed    Dr. Laural Golden - 1998   Hypercholesteremia    Orthostatic hypotension    Peptic ulcer disease    Peripheral neuropathy    Polymyalgia rheumatica ([name]    Stroke ([place])    Residual trouble reading and writing   Syncope    Thyroid nodule    TIA (transient ischemic attack) 12/2017   Type 2 diabetes mellitus [facility])     Patient Active Problem List   Diagnosis Date Noted   Lower urinary tract symptoms (LUTS) 10/31/2020   Prostate cancer ([place]) 06/20/2020   Elevated PSA 04/18/2020   Nocturia 04/18/2020   Cobalamin deficiency 02/22/2020   Intractable chronic migraine without aura 02/20/2020   Altered mental status 02/20/2020   Asthenia 02/20/2020   Diabetic peripheral neuropathy ([place]) 02/20/2020   Obstructive sleep apnea of adult 02/20/2020   Medicare annual wellness visit, subsequent 12/07/2019   Depressed mood 12/07/2019   Vitamin D deficiency 10/25/2019   Memory loss  10/25/2019   Fall 10/25/2019   Nonrheumatic aortic valve stenosis 03/23/2018   Migraine headaches 12/29/2017   TIA (transient ischemic attack) 12/28/2017   Rectal bleeding 11/26/2017   Carpal tunnel syndrome, left 07/22/2017   Acquired trigger finger 07/22/2017   Acute cholecystitis 04/05/2016   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   Total knee replacement status 02/28/2016   CVA (cerebral infarction) 12/19/2015   Acute CVA (cerebrovascular accident) ([place]) 12/19/2015   Carotid stenosis 12/19/2015   Postoperative wound dehiscence 07/09/2015   Type 2 diabetes mellitus, uncontrolled ([place]) 06/29/2015   Cellulitis of knee, left 06/29/2015   Wound infection after surgery 06/29/2015   OA (osteoarthritis) of knee 05/28/2015   BRBPR (bright red blood per rectum) 11/21/2014   Orthostatic hypotension 11/21/2014   Dizziness 11/16/2014   Polymyalgia rheumatica ([place]) 09/12/2014   Anemia, iron deficiency 08/16/2014   DJD (degenerative joint disease) 08/16/2014   Essential hypertension    Type 2 diabetes mellitus ([place])    Cataract, nuclear 04/11/2014   Complaining of back-related symptom 03/15/2014   Arthralgia of hip or thigh 03/15/2014   Gonalgia 10/10/2013   Left knee pain 10/10/2013   1st degree AV block 08/04/2013   Infection of the upper respiratory tract 04/05/2013   Cataract 02/24/2013   Pseudoaphakia 02/24/2013   Episode of syncope 01/11/2013   Cardiac murmur 11/09/2012  Near syncope 11/09/2012   Anxiety 06/18/2012   Clinical depression 06/18/2012   Bleeding gastrointestinal 06/18/2012   Diverticulitis 06/18/2012   Difficulty hearing 06/18/2012   Adaptive colitis 06/18/2012   Arthritis, degenerative 06/18/2012   Disease of thyroid gland 06/18/2012   Hearing impairment 06/18/2012   Multinodular goiter 01/29/2012   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   Adenoma of large intestine 05/31/2011   Cephalalgia 05/31/2011   Hypercholesterolemia 05/31/2011   Benign prostatic  hyperplasia with urinary obstruction 05/31/2011   LBP (low back pain) 05/31/2011   Diabetes mellitus ([place]) 05/31/2011   Essential (primary) hypertension 05/31/2011   Headache(784.0) 05/31/2011   Hyperplasia of prostate 05/31/2011    Past Surgical History:  Procedure Laterality Date   APPENDECTOMY     as child   [place] IDET SPINAL [place] N/A 04/07/2016   Procedure: CHOLECYSTECTOMY;  Surgeon: Aviva Signs, MD;  Location: [facility] ORS;  Service: General;  Laterality: N/A;   [place]   COLONOSCOPY N/A 01/07/2018   Procedure: COLONOSCOPY;  Surgeon: Rogene Houston, MD;  Location: [facility] ENDO SUITE;  Service: Endoscopy;  Laterality: N/A;  1:00   CRYOTHERAPY     EYE SURGERY     right cataract with lens implant   FEMUR SURGERY     RT LEG        1953   HAND SURGERY     RIGHT   HERNIA REPAIR     1-right inguinal, 3- left inguinal   IRRIGATION AND DEBRIDEMENT KNEE Left 07/09/2015   Procedure: LEFT KNEE IRRIGATION AND DEBRIDEMENT WOUND CLOSURE;  Surgeon: Gaynelle Arabian, MD;  Location: [facility];  Service: Orthopedics;  Laterality: Left;   JOINT REPLACEMENT  02/28/2016   lt knee revision   KNEE DEBRIDEMENT     2016   LEFT   MENISCUS REPAIR     LEFT   POLYPECTOMY  01/07/2018   Procedure: POLYPECTOMY;  Surgeon: Rogene Houston, MD;  Location: [facility];  Service: Endoscopy;;  colon    SHOULDER OPEN ROTATOR CUFF REPAIR     RT SHOULDER   SPINAL FUSION     SPINAL FUSION     TONSILLECTOMY     TOTAL KNEE ARTHROPLASTY Left 05/28/2015   Procedure: LEFT TOTAL KNEE ARTHROPLASTY;  Surgeon: Gaynelle Arabian, MD;  Location: [facility];  Service: Orthopedics;  Laterality: Left;   TOTAL KNEE REVISION Left 02/28/2016   Procedure: LEFT TOTAL KNEE REVISION;  Surgeon: Leandrew Koyanagi, MD;  Location: [place];  Service: Orthopedics;  Laterality: Left;   TRIGGER FINGER RELEASE     RT HAND   1990S   UVULECTOMY  2004   TURBINATE,TONSIL,ADENOID       Family  History  Problem Relation Age of Onset   Colon cancer Sister    Hypertension Other    Depression Brother    Depression Daughter     Social History   Tobacco Use   Smoking status: Former    Packs/day: 0.10    Years: 18.00    Pack years: 1.80    Types: Cigarettes    Start date: 12/18/1958    Quit date: 12/01/1986    Years since quitting: 34.6   Smokeless tobacco: Never  Vaping Use   Vaping Use: Never used  Substance Use Topics   Alcohol use: No    Alcohol/week: 0.0 standard drinks   Drug use: No    Home Medications Prior to Admission  medications   Medication Sig Start Date End Date Taking? Authorizing Provider  alfuzosin (UROXATRAL) 10 MG 24 hr tablet Take 10 mg by mouth every evening. 11/15/20   [provider]  Apremilast (OTEZLA) 10 & 20 & 30 MG TBPK Take 10-30 mg by mouth See admin instructions. Take 10 mg by mouth daily on day 1, take 10 mg by mouth twice daily on day 2, take 10 mg by mouth in the morning and take 20 mg by mouth in the evening on day 3, take 20 mg by mouth twice daily on day 4, take 20 mg by mouth in the morning and take 30 mg by mouth in the evening on day 5, take 30 mg by mouth twice daily on day 6, then continue 30 mg by mouth twice daily    [provider]  atorvastatin (LIPITOR) 40 MG tablet TAKE 1 TABLET BY MOUTH DAILY 06/11/21   Doree Albee, MD  bismuth subsalicylate (PEPTO BISMOL) 262 MG/15ML suspension Take 30 mLs by mouth every 6 (six) hours as needed for diarrhea or loose stools. Patient not taking: Reported on 01/24/2021    [provider]  Cholecalciferol (VITAMIN D3) 5000 units CAPS Take 5,000 Units by mouth at bedtime.    [provider]  diclofenac sodium (VOLTAREN) 1 % GEL Apply 2 g topically daily as needed (for pain). 11/22/15   Dohmeier, Asencion Partridge, MD  enzalutamide Gillermina Phy) 80 MG tablet Take 80 mg by mouth daily. 11/05/20   [provider]  escitalopram (LEXAPRO) 10 MG tablet Take 20 mg by mouth  at bedtime. 01/31/16   [provider]  finasteride (PROSCAR) 5 MG tablet Take 1 tablet (5 mg total) by mouth daily. 04/18/20   McKenzie, Candee Furbish, MD  fluocinonide cream (LIDEX) [id number] % Apply 1 application topically daily as needed (for irritation).    [provider]  fluticasone (FLONASE) 50 MCG/ACT nasal spray Place 2 sprays into both nostrils daily. 04/19/19   Wurst, [place], PA-C  ibuprofen (ADVIL) 600 MG tablet Take 600 mg by mouth every 6 (six) hours as needed. 08/03/20   [provider]  midodrine (PROAMATINE) 10 MG tablet Take 10 mg by mouth 3 (three) times daily.     [provider]  pantoprazole (PROTONIX) 20 MG tablet TAKE 1 TABLET BY MOUTH DAILY 06/11/21   Rehman, Mechele Dawley, MD  pregabalin (LYRICA) 75 MG capsule Take 75 mg by mouth 2 (two) times daily.    [provider]  topiramate (TOPAMAX) 50 MG tablet Take 50 mg by mouth 2 (two) times daily.    [provider]  venlafaxine XR (EFFEXOR-XR) 37.5 MG 24 hr capsule TAKE ONE CAPSULE BY MOUTH EVERY MORNING 03/04/21   Hurshel Party C, MD  vitamin B-12 (CYANOCOBALAMIN) 500 MCG tablet Take 1,000 mcg by mouth daily.    [provider]  zolpidem (AMBIEN) 5 MG tablet Take 5-10 mg by mouth at bedtime. 05/16/20   [provider]    Allergies    Iodine, Calan [verapamil], Cymbalta [duloxetine hcl], Iodinated diagnostic agents, Latex, Lisinopril, Neurontin [gabapentin], Zoloft [sertraline hcl], Valium [diazepam], Flomax [tamsulosin hcl], Glimepiride, Melatonin, and Tape  Review of Systems   Review of Systems  Constitutional:  Negative for fever.  HENT:  Negative for nosebleeds.   Eyes:  Negative for visual disturbance.  Respiratory:  Negative for shortness of breath.   Cardiovascular:  Negative for chest pain.  Gastrointestinal:  Negative for abdominal pain, nausea and vomiting.  Genitourinary:  Negative  for flank pain.  Musculoskeletal:  Positive for neck pain. Negative for  back pain.  Skin:  Positive for wound.  Neurological:  Negative for weakness, light-headedness and numbness.  Hematological:  Does not bruise/bleed easily.  Psychiatric/Behavioral:  Negative for confusion.    Physical Exam Updated Vital Signs BP (!) 156/71 (BP Location: Left Arm)   Pulse 61   Temp 98.2 F (36.8 C) (Oral)   Resp 18   Ht 1.88 m ('6\' 2"'$ )   Wt 95 kg   SpO2 98%   BMI 26.89 kg/m   Physical Exam Vitals and nursing note reviewed.  Constitutional:      Appearance: Normal appearance. He is well-developed.  HENT:     Head:     Comments: Contusion posterior scalp.     Nose: Nose normal.     Mouth/Throat:     Mouth: Mucous membranes are moist.     Pharynx: Oropharynx is clear.  Eyes:     General: No scleral icterus.    Conjunctiva/sclera: Conjunctivae normal.     Pupils: Pupils are equal, round, and reactive to light.  Neck:     Vascular: No carotid bruit.     Trachea: No tracheal deviation.  Cardiovascular:     Rate and Rhythm: Normal rate and regular rhythm.     Pulses: Normal pulses.     Heart sounds: Normal heart sounds. No murmur heard.   No friction rub. No gallop.  Pulmonary:     Effort: Pulmonary effort is normal. No accessory muscle usage or respiratory distress.     Breath sounds: Normal breath sounds.  Chest:     Chest wall: No tenderness.  Abdominal:     General: Bowel sounds are normal. There is no distension.     Palpations: Abdomen is soft.     Tenderness: There is no abdominal tenderness. There is no guarding.  Genitourinary:    Comments: No cva tenderness. Musculoskeletal:        General: No swelling.     Cervical back: Normal range of motion and neck supple. No rigidity.     Comments: Mid cervical tenderness, otherwise, CTLS spine, non tender, aligned, no step off. Superficial, small skin tear to right upper arm. Good rom bil extremities without pain or focal bony tenderness.   Skin:    General: Skin is warm and dry.     Findings: No  rash.  Neurological:     Mental Status: He is alert.     Comments: Alert, speech clear. GCS 15. Motor/sens grossly intact bil.   Psychiatric:        Mood and Affect: Mood normal.    ED Results / Procedures / Treatments   Labs (all labs ordered are listed, but only abnormal results are displayed) Labs Reviewed - No data to display  EKG None  Radiology CT Head Wo Contrast  Result Date: 07/01/2021 CLINICAL DATA:  Head trauma, mod-severe; Neck trauma (Age >= 65y). Tripped over mat and fell today hitting the back of the head. Bilateral shoulder and neck pain. EXAM: CT HEAD WITHOUT CONTRAST CT CERVICAL SPINE WITHOUT CONTRAST TECHNIQUE: Multidetector CT imaging of the head and cervical spine was performed following the standard protocol without intravenous contrast. Multiplanar CT image reconstructions of the cervical spine were also generated. COMPARISON:  Head CT 12/28/2017. Head MRI 12/29/2017. Chest CT 04/25/2015. FINDINGS: CT HEAD FINDINGS Brain: There is no evidence of an acute infarct, intracranial hemorrhage, mass, midline shift, or extra-axial fluid collection. Patchy hypodensities in the  cerebral white matter bilaterally may have mildly progressed from the prior CT and are nonspecific but compatible with moderate chronic small vessel ischemic disease. There is mild cerebral atrophy. Vascular: Calcified atherosclerosis at the skull base. No hyperdense vessel. Skull: No acute fracture or suspicious osseous lesion. Sinuses/Orbits: Visualized paranasal sinuses are clear. Small chronic left mastoid effusion. Chronic medial right orbital fracture. Bilateral cataract extraction. Other: Left occipital scalp contusion. CT CERVICAL SPINE FINDINGS Alignment: Cervical spine straightening.  No listhesis. Skull base and vertebrae: Widespread interbody ankylosis is present in the cervical and included upper thoracic spine. A fracture through an anterior vertebral osteophyte at C6-7 is new from 2016 but favored  to be nonacute. No posterior element fracture or suspicious osseous lesion is identified. Soft tissues and spinal canal: No prevertebral fluid or swelling. No visible canal hematoma. Disc levels: Widespread ankylosis. No evidence of high-grade spinal canal stenosis. Multilevel neural foraminal stenosis due to facet and uncovertebral hypertrophy including severe left neural foraminal stenosis at C3-4 and C4-5. Upper chest: Clear lung apices. Other: Mild carotid atherosclerosis. Small thyroid nodules measuring up to approximately 1 cm for which no imaging follow-up is recommended. IMPRESSION: 1. No evidence of acute intracranial abnormality. 2. Moderate chronic small vessel ischemic disease. 3. Left occipital scalp contusion. 4. 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 If there is clinical concern for an acute fracture or acute soft tissue injury in the cervical spine, consider MRI for further evaluation. Electronically Signed   By: Logan Bores M.D.   On: 07/01/2021 14:40   CT Cervical Spine Wo Contrast  Result Date: 07/01/2021 CLINICAL DATA:  Head trauma, mod-severe; Neck trauma (Age >= 65y). Tripped over mat and fell today hitting the back of the head. Bilateral shoulder and neck pain. EXAM: CT HEAD WITHOUT CONTRAST CT CERVICAL SPINE WITHOUT CONTRAST TECHNIQUE: Multidetector CT imaging of the head and cervical spine was performed following the standard protocol without intravenous contrast. Multiplanar CT image reconstructions of the cervical spine were also generated. COMPARISON:  Head CT 12/28/2017. Head MRI 12/29/2017. Chest CT 04/25/2015. FINDINGS: CT HEAD FINDINGS Brain: There is no evidence of an acute infarct, intracranial hemorrhage, mass, midline shift, or extra-axial fluid collection. Patchy hypodensities in the cerebral white matter bilaterally may have mildly progressed from the prior CT and  are nonspecific but compatible with moderate chronic small vessel ischemic disease. There is mild cerebral atrophy. Vascular: Calcified atherosclerosis at the skull base. No hyperdense vessel. Skull: No acute fracture or suspicious osseous lesion. Sinuses/Orbits: Visualized paranasal sinuses are clear. Small chronic left mastoid effusion. Chronic medial right orbital fracture. Bilateral cataract extraction. Other: Left occipital scalp contusion. CT CERVICAL SPINE FINDINGS Alignment: Cervical spine straightening.  No listhesis. Skull base and vertebrae: Widespread interbody ankylosis is present in the cervical and included upper thoracic spine. A fracture through an anterior vertebral osteophyte at C6-7 is new from 2016 but favored to be nonacute. No posterior element fracture or suspicious osseous lesion is identified. Soft tissues and spinal canal: No prevertebral fluid or swelling. No visible canal hematoma. Disc levels: Widespread ankylosis. No evidence of high-grade spinal canal stenosis. Multilevel neural foraminal stenosis due to facet and uncovertebral hypertrophy including severe left neural foraminal stenosis at C3-4 and C4-5. Upper chest: Clear lung apices. Other: Mild carotid atherosclerosis. Small thyroid nodules measuring up to approximately 1 cm for which no imaging follow-up is recommended. IMPRESSION: 1. No evidence of acute intracranial abnormality.  2. Moderate chronic small vessel ischemic disease. 3. Left occipital scalp contusion. 4. 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 If there is clinical concern for an acute fracture or acute soft tissue injury in the cervical spine, consider MRI for further evaluation. Electronically Signed   By: Logan Bores M.D.   On: 07/01/2021 14:40    Procedures Procedures   Medications Ordered in ED Medications  Tdap (BOOSTRIX) injection 0.5 mL (0.5  mLs Intramuscular Patient Refused/Not Given 07/01/21 1526)  acetaminophen (TYLENOL) tablet 1,000 mg (1,000 mg Oral Given 07/01/21 1525)    ED Course  I have reviewed the triage vital signs and the nursing notes.  Pertinent labs & imaging results that were available during my care of the patient were reviewed by me and considered in my medical decision making (see chart for details).    MDM Rules/Calculators/A&P                          Imaging ordered.  Reviewed nursing notes and prior charts for additional history.   Tetanus im.   Wound care/sterile dressing. Acetaminophen po.  CT reviewed/interpreted by me - no hem. No acute fx noted.  On recheck of patient, good rom c spine without worsening pain, or any radicular pain, no numbness/tingling or weakness. On recheck spine, there is no point or focal midline bony tenderness.   Pt currently appears stable for d/c.  Return precautions provided.      Final Clinical Impression(s) / ED Diagnoses Final diagnoses:  None    Rx / DC Orders ED Discharge Orders     None        Lajean Saver, MD 07/01/21 1530

## 2021-07-01 NOTE — ED Notes (Signed)
[place] pt daughter's phone number.

## 2021-07-01 NOTE — ED Triage Notes (Signed)
Pt c/o generalized weakness and family can't walk or stand since fall. Pt was seen here for the same. Family states pt fell again after getting home.

## 2021-07-01 NOTE — ED Provider Notes (Signed)
Emergency Department Provider Note   I have reviewed the triage vital signs and the nursing notes.   HISTORY  Chief Complaint Weakness   HPI Jared Bridges is a 87 y.o. male with past medical history reviewed below returns to the emergency department by EMS with development of numbness in his hands and some weakness in the bilateral upper extremities.  Patient states he had a slip and fall today which caused him to fall backwards hitting his head.  He has a skin tear to the right elbow.  He came to the emergency department and had CT imaging of the head and cervical spine.  He was ultimately discharged.  Patient states since returning home when he noticed numbness in the bilateral hands/arms and weakness. Per EMS, the patient's family reported a second fall but patient denies this. He states when he noticed the change he called his daughter who advised he return to the ED.   Past Medical History:  Diagnosis Date   Anemia, iron deficiency    Chronic headaches    Migraines   Chronic kidney disease    Resolved   Depression    Diverticulitis    Essential hypertension    GI bleed    Dr. Laural Golden - 1998   Hypercholesteremia    Orthostatic hypotension    Peptic ulcer disease    Peripheral neuropathy    Polymyalgia rheumatica ([name]    Stroke ([place])    Residual trouble reading and writing   Syncope    Thyroid nodule    TIA (transient ischemic attack) 12/2017   Type 2 diabetes mellitus [facility])     Patient Active Problem List   Diagnosis Date Noted   Lower urinary tract symptoms (LUTS) 10/31/2020   Prostate cancer ([place]) 06/20/2020   Elevated PSA 04/18/2020   Nocturia 04/18/2020   Cobalamin deficiency 02/22/2020   Intractable chronic migraine without aura 02/20/2020   Altered mental status 02/20/2020   Asthenia 02/20/2020   Diabetic peripheral neuropathy ([place]) 02/20/2020   Obstructive sleep apnea of adult 02/20/2020   Medicare annual wellness visit, subsequent 12/07/2019    Depressed mood 12/07/2019   Vitamin D deficiency 10/25/2019   Memory loss 10/25/2019   Fall 10/25/2019   Nonrheumatic aortic valve stenosis 03/23/2018   Migraine headaches 12/29/2017   TIA (transient ischemic attack) 12/28/2017   Rectal bleeding 11/26/2017   Carpal tunnel syndrome, left 07/22/2017   Acquired trigger finger 07/22/2017   Acute cholecystitis 04/05/2016   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   Total knee replacement status 02/28/2016   CVA (cerebral infarction) 12/19/2015   Acute CVA (cerebrovascular accident) ([place]) 12/19/2015   Carotid stenosis 12/19/2015   Postoperative wound dehiscence 07/09/2015   Type 2 diabetes mellitus, uncontrolled ([place]) 06/29/2015   Cellulitis of knee, left 06/29/2015   Wound infection after surgery 06/29/2015   OA (osteoarthritis) of knee 05/28/2015   BRBPR (bright red blood per rectum) 11/21/2014   Orthostatic hypotension 11/21/2014   Dizziness 11/16/2014   Polymyalgia rheumatica ([place]) 09/12/2014   Anemia, iron deficiency 08/16/2014   DJD (degenerative joint disease) 08/16/2014   Essential hypertension    Type 2 diabetes mellitus ([place])    Cataract, nuclear 04/11/2014   Complaining of back-related symptom 03/15/2014   Arthralgia of hip or thigh 03/15/2014   Gonalgia 10/10/2013   Left knee pain 10/10/2013   1st degree AV block 08/04/2013   Infection of the upper respiratory tract 04/05/2013   Cataract 02/24/2013   Pseudoaphakia 02/24/2013  Episode of syncope 01/11/2013   Cardiac murmur 11/09/2012   Near syncope 11/09/2012   Anxiety 06/18/2012   Clinical depression 06/18/2012   Bleeding gastrointestinal 06/18/2012   Diverticulitis 06/18/2012   Difficulty hearing 06/18/2012   Adaptive colitis 06/18/2012   Arthritis, degenerative 06/18/2012   Disease of thyroid gland 06/18/2012   Hearing impairment 06/18/2012   Multinodular goiter 01/29/2012   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   Adenoma of large intestine 05/31/2011    Cephalalgia 05/31/2011   Hypercholesterolemia 05/31/2011   Benign prostatic hyperplasia with urinary obstruction 05/31/2011   LBP (low back pain) 05/31/2011   Diabetes mellitus ([place]) 05/31/2011   Essential (primary) hypertension 05/31/2011   Headache(784.0) 05/31/2011   Hyperplasia of prostate 05/31/2011    Past Surgical History:  Procedure Laterality Date   APPENDECTOMY     as child   [place] IDET SPINAL [place] N/A 04/07/2016   Procedure: CHOLECYSTECTOMY;  Surgeon: Aviva Signs, MD;  Location: [facility];  Service: General;  Laterality: N/A;   [place]   COLONOSCOPY N/A 01/07/2018   Procedure: COLONOSCOPY;  Surgeon: Rogene Houston, MD;  Location: [facility];  Service: Endoscopy;  Laterality: N/A;  1:00   CRYOTHERAPY     EYE SURGERY     right cataract with lens implant   FEMUR SURGERY     RT LEG        1953   HAND SURGERY     RIGHT   HERNIA REPAIR     1-right inguinal, 3- left inguinal   IRRIGATION AND DEBRIDEMENT KNEE Left 07/09/2015   Procedure: LEFT KNEE IRRIGATION AND DEBRIDEMENT WOUND CLOSURE;  Surgeon: Gaynelle Arabian, MD;  Location: [facility];  Service: Orthopedics;  Laterality: Left;   JOINT REPLACEMENT  02/28/2016   lt knee revision   KNEE DEBRIDEMENT     2016   LEFT   MENISCUS REPAIR     LEFT   POLYPECTOMY  01/07/2018   Procedure: POLYPECTOMY;  Surgeon: Rogene Houston, MD;  Location: [facility];  Service: Endoscopy;;  colon    SHOULDER OPEN ROTATOR CUFF REPAIR     RT SHOULDER   SPINAL FUSION     SPINAL FUSION     TONSILLECTOMY     TOTAL KNEE ARTHROPLASTY Left 05/28/2015   Procedure: LEFT TOTAL KNEE ARTHROPLASTY;  Surgeon: Gaynelle Arabian, MD;  Location: [facility];  Service: Orthopedics;  Laterality: Left;   TOTAL KNEE REVISION Left 02/28/2016   Procedure: LEFT TOTAL KNEE REVISION;  Surgeon: Leandrew Koyanagi, MD;  Location: [place];  Service: Orthopedics;  Laterality: Left;   TRIGGER FINGER RELEASE     RT HAND    1990S   UVULECTOMY  2004   TURBINATE,TONSIL,ADENOID    Allergies Iodine, Calan [verapamil], Cymbalta [duloxetine hcl], Iodinated diagnostic agents, Latex, Lisinopril, Neurontin [gabapentin], Zoloft [sertraline hcl], Valium [diazepam], Flomax [tamsulosin hcl], Glimepiride, Melatonin, and Tape  Family History  Problem Relation Age of Onset   Colon cancer Sister    Hypertension Other    Depression Brother    Depression Daughter     Social History Social History   Tobacco Use   Smoking status: Former    Packs/day: 0.10    Years: 18.00    Pack years: 1.80    Types: Cigarettes    Start date: 12/18/1958    Quit date: 12/01/1986    Years since quitting: 34.6   Smokeless tobacco: Never  Vaping Use   Vaping Use: Never used  Substance Use Topics   Alcohol use: No    Alcohol/week: 0.0 standard drinks   Drug use: No    Review of Systems  Constitutional: No fever/chills Eyes: No visual changes. ENT: No sore throat. Cardiovascular: Denies chest pain. Respiratory: Denies shortness of breath. Gastrointestinal: No abdominal pain.  No nausea, no vomiting.  No diarrhea.  No constipation. Genitourinary: Negative for dysuria. Musculoskeletal: Negative for back pain. Skin: Negative for rash. Neurological: Negative for headaches. Bilateral hand numbness and weakness in the upper extremities. No leg symptoms.   10-point ROS otherwise negative.  ____________________________________________   PHYSICAL EXAM:  VITAL SIGNS: ED Triage Vitals  Enc Vitals Group     BP 07/01/21 2000 (!) 146/63     Pulse Rate 07/01/21 2000 73     Resp 07/01/21 2000 20     Temp 07/01/21 2000 98.1 F (36.7 C)     Temp src --      SpO2 07/01/21 2000 100 %     Weight 07/01/21 1952 209 lb 7 oz (95 kg)     Height 07/01/21 1952 '6\' 2"'$  (1.88 m)   Constitutional: Alert and oriented. Well appearing and in no acute distress. Eyes: Conjunctivae are normal.  Head: Atraumatic. Nose: No  congestion/rhinnorhea. Mouth/Throat: Mucous membranes are moist.   Neck: No stridor. C collar in place.  Cardiovascular: Normal rate, regular rhythm. Good peripheral circulation. Grossly normal heart sounds.   Respiratory: Normal respiratory effort.  No retractions. Lungs CTAB. Gastrointestinal: Soft and nontender. No distention.  Musculoskeletal: No lower extremity tenderness nor edema. No gross deformities of extremities. Neurologic:  Normal speech and language. 4+/5 strength in grip, bicep/triceps bilaterally. 5/5 strength in the bilateral LEs.  No clear sensory deficits although subjectively in the bilateral hands.  Skin:  Skin is warm, dry and intact. No rash noted.   ____________________________________________   LABS (all labs ordered are listed, but only abnormal results are displayed)  Labs Reviewed  COMPREHENSIVE METABOLIC PANEL - Abnormal; Notable for the following components:      Result Value   CO2 19 (*)    Glucose, Bld 182 (*)    Calcium 8.4 (*)    All other components within normal limits  CBC WITH DIFFERENTIAL/PLATELET - Abnormal; Notable for the following components:   WBC 12.2 (*)    RBC 3.77 (*)    Hemoglobin 11.9 (*)    HCT 37.6 (*)    Neutro Abs 10.7 (*)    Lymphs Abs 0.5 (*)    Abs Immature Granulocytes 0.14 (*)    All other components within normal limits  RESP PANEL BY RT-PCR (FLU A&B, COVID) ARPGX2  TSH  PROTIME-INR   ____________________________________________  EKG   EKG Interpretation  Date/Time:  Monday July 01 2021 20:01:49 EDT Ventricular Rate:  74 PR Interval:  143 QRS Duration: 93 QT Interval:  432 QTC Calculation: 480 R Axis:   -41 Text Interpretation: Sinus rhythm Abnormal R-wave progression, early transition Inferior infarct, old Confirmed by Nanda Quinton [phone]) on 07/02/2021 1:34:33 AM        ____________________________________________  RADIOLOGY  CT HEAD WO CONTRAST (5MM)  Result Date: 07/01/2021 CLINICAL DATA:  Head  trauma, minor (Age >= 65y) Generalized weakness. Fall since being evaluated earlier today for a prior fall. EXAM: CT HEAD WITHOUT CONTRAST TECHNIQUE: Contiguous axial images were obtained from the base of the skull through the vertex without intravenous contrast. COMPARISON:  Head CT earlier today. FINDINGS: Brain:  Stable atrophy and chronic small vessel ischemia. No intracranial hemorrhage, mass effect, or midline shift. No hydrocephalus. The basilar cisterns are patent. No evidence of territorial infarct or acute ischemia. No extra-axial or intracranial fluid collection. Vascular: Atherosclerosis of skullbase vasculature without hyperdense vessel or abnormal calcification. Skull: No fracture or focal lesion. Sinuses/Orbits: Paranasal sinuses and mastoid air cells are clear. Remote medial right orbital wall fracture again seen. Bilateral cataract resection. Other: Left occipital scalp contusion slightly improved from earlier today. IMPRESSION: 1. Small left occipital scalp hematoma. No acute intracranial abnormality. No skull fracture. 2. Stable atrophy and chronic small vessel ischemia. Electronically Signed   By: Keith Rake M.D.   On: 07/01/2021 21:20   CT Head Wo Contrast  Result Date: 07/01/2021 CLINICAL DATA:  Head trauma, mod-severe; Neck trauma (Age >= 65y). Tripped over mat and fell today hitting the back of the head. Bilateral shoulder and neck pain. EXAM: CT HEAD WITHOUT CONTRAST CT CERVICAL SPINE WITHOUT CONTRAST TECHNIQUE: Multidetector CT imaging of the head and cervical spine was performed following the standard protocol without intravenous contrast. Multiplanar CT image reconstructions of the cervical spine were also generated. COMPARISON:  Head CT 12/28/2017. Head MRI 12/29/2017. Chest CT 04/25/2015. FINDINGS: CT HEAD FINDINGS Brain: There is no evidence of an acute infarct, intracranial hemorrhage, mass, midline shift, or extra-axial fluid collection. Patchy hypodensities in the cerebral  white matter bilaterally may have mildly progressed from the prior CT and are nonspecific but compatible with moderate chronic small vessel ischemic disease. There is mild cerebral atrophy. Vascular: Calcified atherosclerosis at the skull base. No hyperdense vessel. Skull: No acute fracture or suspicious osseous lesion. Sinuses/Orbits: Visualized paranasal sinuses are clear. Small chronic left mastoid effusion. Chronic medial right orbital fracture. Bilateral cataract extraction. Other: Left occipital scalp contusion. CT CERVICAL SPINE FINDINGS Alignment: Cervical spine straightening.  No listhesis. Skull base and vertebrae: Widespread interbody ankylosis is present in the cervical and included upper thoracic spine. A fracture through an anterior vertebral osteophyte at C6-7 is new from 2016 but favored to be nonacute. No posterior element fracture or suspicious osseous lesion is identified. Soft tissues and spinal canal: No prevertebral fluid or swelling. No visible canal hematoma. Disc levels: Widespread ankylosis. No evidence of high-grade spinal canal stenosis. Multilevel neural foraminal stenosis due to facet and uncovertebral hypertrophy including severe left neural foraminal stenosis at C3-4 and C4-5. Upper chest: Clear lung apices. Other: Mild carotid atherosclerosis. Small thyroid nodules measuring up to approximately 1 cm for which no imaging follow-up is recommended. IMPRESSION: 1. No evidence of acute intracranial abnormality. 2. Moderate chronic small vessel ischemic disease. 3. Left occipital scalp contusion. 4. 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 If there is clinical concern for an acute fracture or acute soft tissue injury in the cervical spine, consider MRI for further evaluation. Electronically Signed   By: Logan Bores M.D.   On: 07/01/2021 14:40   CT Cervical Spine Wo  Contrast  Result Date: 07/01/2021 CLINICAL DATA:  Neck trauma (Age >= 65y) Generalized weakness, patient can walk since fall. Fall occurred after being evaluated for a prior fall earlier today. EXAM: CT CERVICAL SPINE WITHOUT CONTRAST TECHNIQUE: Multidetector CT imaging of the cervical spine was performed without intravenous contrast. Multiplanar CT image reconstructions were also generated. COMPARISON:  Cervical spine CT earlier today. FINDINGS: Alignment: Straightening of normal lordosis. No traumatic subluxation. Skull base and vertebrae: No acute fracture. Flowing  anterior osteophytes in bony ankylosis. Fracture through an anterior vertebral osteophyte at C6-C7 is unchanged from earlier today and felt to be chronic. Dens and skull base are intact. Soft tissues and spinal canal: No prevertebral fluid or swelling. No visible canal hematoma. Disc levels: Diffuse thoracic ankylosis. Stable facet hypertrophy. No change from earlier today. Upper chest: Nonacute. Other: None. IMPRESSION: 1. No acute fracture or subluxation of the cervical spine. 2. Stable exam from CT earlier today. Again seen diffuse cervical ankylosis with fracture through the anterior vertebral osteophyte at C6-C7. This is favored to be chronic, but if there is clinical concern for acute fracture, MRI is again recommended. Electronically Signed   By: Keith Rake M.D.   On: 07/01/2021 21:17   CT Cervical Spine Wo Contrast  Result Date: 07/01/2021 CLINICAL DATA:  Head trauma, mod-severe; Neck trauma (Age >= 65y). Tripped over mat and fell today hitting the back of the head. Bilateral shoulder and neck pain. EXAM: CT HEAD WITHOUT CONTRAST CT CERVICAL SPINE WITHOUT CONTRAST TECHNIQUE: Multidetector CT imaging of the head and cervical spine was performed following the standard protocol without intravenous contrast. Multiplanar CT image reconstructions of the cervical spine were also generated. COMPARISON:  Head CT 12/28/2017. Head MRI  12/29/2017. Chest CT 04/25/2015. FINDINGS: CT HEAD FINDINGS Brain: There is no evidence of an acute infarct, intracranial hemorrhage, mass, midline shift, or extra-axial fluid collection. Patchy hypodensities in the cerebral white matter bilaterally may have mildly progressed from the prior CT and are nonspecific but compatible with moderate chronic small vessel ischemic disease. There is mild cerebral atrophy. Vascular: Calcified atherosclerosis at the skull base. No hyperdense vessel. Skull: No acute fracture or suspicious osseous lesion. Sinuses/Orbits: Visualized paranasal sinuses are clear. Small chronic left mastoid effusion. Chronic medial right orbital fracture. Bilateral cataract extraction. Other: Left occipital scalp contusion. CT CERVICAL SPINE FINDINGS Alignment: Cervical spine straightening.  No listhesis. Skull base and vertebrae: Widespread interbody ankylosis is present in the cervical and included upper thoracic spine. A fracture through an anterior vertebral osteophyte at C6-7 is new from 2016 but favored to be nonacute. No posterior element fracture or suspicious osseous lesion is identified. Soft tissues and spinal canal: No prevertebral fluid or swelling. No visible canal hematoma. Disc levels: Widespread ankylosis. No evidence of high-grade spinal canal stenosis. Multilevel neural foraminal stenosis due to facet and uncovertebral hypertrophy including severe left neural foraminal stenosis at C3-4 and C4-5. Upper chest: Clear lung apices. Other: Mild carotid atherosclerosis. Small thyroid nodules measuring up to approximately 1 cm for which no imaging follow-up is recommended. IMPRESSION: 1. No evidence of acute intracranial abnormality. 2. Moderate chronic small vessel ischemic disease. 3. Left occipital scalp contusion. 4. 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 If there is clinical  concern for an acute fracture or acute soft tissue injury in the cervical spine, consider MRI for further evaluation. Electronically Signed   By: Logan Bores M.D.   On: 07/01/2021 14:40   MR BRAIN WO CONTRAST  Result Date: 07/02/2021 CLINICAL DATA:  Generalized weakness and fall EXAM: MRI HEAD WITHOUT CONTRAST TECHNIQUE: Multiplanar, multiecho pulse sequences of the brain and surrounding structures were obtained without intravenous contrast. COMPARISON:  12/29/2017 FINDINGS: Brain: No acute infarct, mass effect or extra-axial collection. No acute or chronic hemorrhage. Hyperintense T2-weighted signal is moderately widespread throughout the white matter. Generalized volume loss without a clear lobar predilection. The midline structures are normal.  Vascular: Major flow voids are preserved. Skull and upper cervical spine: Normal calvarium and skull base. Visualized upper cervical spine and soft tissues are normal. Sinuses/Orbits:No paranasal sinus fluid levels or advanced mucosal thickening. No mastoid or middle ear effusion. Normal orbits. IMPRESSION: 1. No acute intracranial abnormality. 2. Moderate chronic microvascular ischemic disease and generalized volume loss. Electronically Signed   By: Ulyses Jarred M.D.   On: 07/02/2021 00:19   MR Cervical Spine Wo Contrast  Result Date: 07/02/2021 CLINICAL DATA:  Fall EXAM: MRI CERVICAL SPINE WITHOUT CONTRAST TECHNIQUE: Multiplanar, multisequence MR imaging of the cervical spine was performed. No intravenous contrast was administered. COMPARISON:  None. FINDINGS: Alignment: Physiologic. Vertebrae: There is diffuse cervical ankylosis with a fractured anterior vertebral osteophyte at C6-7. There is mild C6-7 endplate edema and edema and disc space. There is a focal tear of ligamentum flavum at the C6-7 level. Cord: There is dorsal epidural hematoma extending from C5-T1 measuring 8 mm in AP thickness. This severely attenuates the thecal sac and causes mild compression  of the spinal cord. Posterior Fossa, vertebral arteries, paraspinal tissues: Negative. Disc levels: Aside from the above described C6-7 disc space abnormalities, there is only mild degenerative disc disease. IMPRESSION: 1. Dorsal epidural hematoma extending from C5-T1 measuring 8 mm in AP thickness, severely narrowing the thecal sac and mildly compressing the spinal cord. 2. Focal tear of the ligamentum flavum at C6-7. Fracture at C6-7 is likely acute or early subacute. 3. 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 Critical Value/emergent results were called by telephone at the time of interpretation on 07/02/2021 at 12:37 am to Dr. Betsey Holiday, who verbally acknowledged these results. Electronically Signed   By: Ulyses Jarred M.D.   On: 07/02/2021 00:38    ____________________________________________   PROCEDURES  Procedure(s) performed:   Procedures  CRITICAL CARE Performed by: Margette Fast Total critical care time: 35 minutes Critical care time was exclusive of separately billable procedures and treating other patients. Critical care was necessary to treat or prevent imminent or life-threatening deterioration. Critical care was time spent personally by me on the following activities: development of treatment plan with patient and/or surrogate as well as nursing, discussions with consultants, evaluation of patient's response to treatment, examination of patient, obtaining history from patient or surrogate, ordering and performing treatments and interventions, ordering and review of laboratory studies, ordering and review of radiographic studies, pulse oximetry and re-evaluation of patient's condition.  Nanda Quinton, MD Emergency Medicine  ____________________________________________   INITIAL IMPRESSION / ASSESSMENT AND PLAN / ED COURSE  Pertinent labs & imaging results that were available during my care of the patient were reviewed by me and considered  in my medical decision making (see chart for details).   Patient presents emergency department with new weakness in the bilateral upper extremities with some numbness in the hands.  Review of his CT imaging from earlier today shows nondisplaced fracture through the anterior vertebral osteophyte at C6/7.  It was favored to be nonacute.  Patient not having neuro symptoms at that time according to the prior note.  Exam is not consistent with stroke.  There is some question as to whether the patient fell again or not.  I will repeat CT imaging of the head and cervical spine with there being some conflict there.  Plan for urgent transfer for MRI with neuro.   08:20 AM  Arrange patient to be transferred ED to ED for urgent MRI of the cervical spine.  Have  added MRI of the brain as well. Dr. Sherry Ruffing is the accepting [facility] at [facility]. [facility] called for urgent transport.    ____________________________________________  FINAL CLINICAL IMPRESSION(S) / ED DIAGNOSES  Final diagnoses:  Epidural hematoma ([place])  Neck soft tissue injury, initial encounter    Note:  This document was prepared using Dragon voice recognition software and may include unintentional dictation errors.  Nanda Quinton, MD, [facility] Emergency Medicine    Oscar Hank, [facility], MD 07/02/21 [phone]

## 2021-07-02 ENCOUNTER — Encounter (HOSPITAL_COMMUNITY): Payer: Self-pay | Admitting: Internal Medicine

## 2021-07-02 ENCOUNTER — Ambulatory Visit (INDEPENDENT_AMBULATORY_CARE_PROVIDER_SITE_OTHER): Payer: Medicare Other | Admitting: Internal Medicine

## 2021-07-02 DIAGNOSIS — N400 Enlarged prostate without lower urinary tract symptoms: Secondary | ICD-10-CM | POA: Diagnosis present

## 2021-07-02 DIAGNOSIS — Z8711 Personal history of peptic ulcer disease: Secondary | ICD-10-CM | POA: Diagnosis not present

## 2021-07-02 DIAGNOSIS — Y92002 Bathroom of unspecified non-institutional (private) residence single-family (private) house as the place of occurrence of the external cause: Secondary | ICD-10-CM | POA: Diagnosis not present

## 2021-07-02 DIAGNOSIS — S064X9A Epidural hemorrhage with loss of consciousness of unspecified duration, initial encounter: Secondary | ICD-10-CM | POA: Diagnosis present

## 2021-07-02 DIAGNOSIS — Z800 Family history of malignant neoplasm of digestive organs: Secondary | ICD-10-CM | POA: Diagnosis not present

## 2021-07-02 DIAGNOSIS — Z8673 Personal history of transient ischemic attack (TIA), and cerebral infarction without residual deficits: Secondary | ICD-10-CM | POA: Diagnosis not present

## 2021-07-02 DIAGNOSIS — M353 Polymyalgia rheumatica: Secondary | ICD-10-CM | POA: Diagnosis present

## 2021-07-02 DIAGNOSIS — H9190 Unspecified hearing loss, unspecified ear: Secondary | ICD-10-CM | POA: Diagnosis present

## 2021-07-02 DIAGNOSIS — Z961 Presence of intraocular lens: Secondary | ICD-10-CM | POA: Diagnosis present

## 2021-07-02 DIAGNOSIS — Z9049 Acquired absence of other specified parts of digestive tract: Secondary | ICD-10-CM | POA: Diagnosis not present

## 2021-07-02 DIAGNOSIS — S064XAA Epidural hemorrhage with loss of consciousness status unknown, initial encounter: Secondary | ICD-10-CM | POA: Diagnosis present

## 2021-07-02 DIAGNOSIS — S51011A Laceration without foreign body of right elbow, initial encounter: Secondary | ICD-10-CM | POA: Diagnosis present

## 2021-07-02 DIAGNOSIS — Z20822 Contact with and (suspected) exposure to covid-19: Secondary | ICD-10-CM | POA: Diagnosis present

## 2021-07-02 DIAGNOSIS — S12500A Unspecified displaced fracture of sixth cervical vertebra, initial encounter for closed fracture: Secondary | ICD-10-CM | POA: Diagnosis present

## 2021-07-02 DIAGNOSIS — Z96642 Presence of left artificial hip joint: Secondary | ICD-10-CM | POA: Diagnosis present

## 2021-07-02 DIAGNOSIS — E1165 Type 2 diabetes mellitus with hyperglycemia: Secondary | ICD-10-CM | POA: Diagnosis present

## 2021-07-02 DIAGNOSIS — C61 Malignant neoplasm of prostate: Secondary | ICD-10-CM | POA: Diagnosis present

## 2021-07-02 DIAGNOSIS — I5032 Chronic diastolic (congestive) heart failure: Secondary | ICD-10-CM | POA: Diagnosis present

## 2021-07-02 DIAGNOSIS — S199XXA Unspecified injury of neck, initial encounter: Secondary | ICD-10-CM | POA: Diagnosis not present

## 2021-07-02 DIAGNOSIS — Z66 Do not resuscitate: Secondary | ICD-10-CM | POA: Diagnosis present

## 2021-07-02 DIAGNOSIS — I110 Hypertensive heart disease with heart failure: Secondary | ICD-10-CM | POA: Diagnosis present

## 2021-07-02 DIAGNOSIS — E872 Acidosis: Secondary | ICD-10-CM | POA: Diagnosis present

## 2021-07-02 DIAGNOSIS — Z9841 Cataract extraction status, right eye: Secondary | ICD-10-CM | POA: Diagnosis not present

## 2021-07-02 DIAGNOSIS — C7951 Secondary malignant neoplasm of bone: Secondary | ICD-10-CM | POA: Diagnosis present

## 2021-07-02 DIAGNOSIS — S14127D Central cord syndrome at C7 level of cervical spinal cord, subsequent encounter: Secondary | ICD-10-CM | POA: Diagnosis not present

## 2021-07-02 DIAGNOSIS — S12501D Unspecified nondisplaced fracture of sixth cervical vertebra, subsequent encounter for fracture with routine healing: Secondary | ICD-10-CM | POA: Diagnosis not present

## 2021-07-02 DIAGNOSIS — Z981 Arthrodesis status: Secondary | ICD-10-CM | POA: Diagnosis not present

## 2021-07-02 DIAGNOSIS — S12600A Unspecified displaced fracture of seventh cervical vertebra, initial encounter for closed fracture: Secondary | ICD-10-CM | POA: Diagnosis present

## 2021-07-02 DIAGNOSIS — W01198A Fall on same level from slipping, tripping and stumbling with subsequent striking against other object, initial encounter: Secondary | ICD-10-CM | POA: Diagnosis present

## 2021-07-02 DIAGNOSIS — E1142 Type 2 diabetes mellitus with diabetic polyneuropathy: Secondary | ICD-10-CM | POA: Diagnosis present

## 2021-07-02 LAB — BASIC METABOLIC PANEL
Anion gap: 8 (ref 5–15)
BUN: 20 mg/dL (ref 8–23)
CO2: 23 mmol/L (ref 22–32)
Calcium: 8.5 mg/dL — ABNORMAL LOW (ref 8.9–10.3)
Chloride: 107 mmol/L (ref 98–111)
Creatinine, Ser: 1.1 mg/dL (ref 0.61–1.24)
GFR, Estimated: >60 mL/min (ref >60)
Glucose, Bld: 156 mg/dL — ABNORMAL HIGH (ref 70–99)
Potassium: 4.2 mmol/L (ref 3.5–5.1)
Sodium: 138 mmol/L (ref 135–145)

## 2021-07-02 LAB — MRSA NEXT GEN BY PCR, NASAL: MRSA by PCR Next Gen: NOT DETECTED

## 2021-07-02 LAB — CBC
HCT: 34.8 % — ABNORMAL LOW (ref 39.0–52.0)
Hemoglobin: 11.5 g/dL — ABNORMAL LOW (ref 13.0–17.0)
MCH: 31.8 pg (ref 26.0–34.0)
MCHC: 33 g/dL (ref 30.0–36.0)
MCV: 96.1 fL (ref 80.0–100.0)
Platelets: 212 10*3/uL (ref 150–400)
RBC: 3.62 MIL/uL — ABNORMAL LOW (ref 4.22–5.81)
RDW: 13.2 % (ref 11.5–15.5)
WBC: 10.3 10*3/uL (ref 4.0–10.5)
nRBC: 0 % (ref 0.0–0.2)

## 2021-07-02 LAB — GLUCOSE, CAPILLARY
Glucose-Capillary: 179 mg/dL — ABNORMAL HIGH (ref 70–99)
Glucose-Capillary: 184 mg/dL — ABNORMAL HIGH (ref 70–99)

## 2021-07-02 MED ORDER — ONDANSETRON HCL 4 MG/2ML IJ SOLN: 4.0000 mg | Freq: Four times a day (QID) | INTRAMUSCULAR | Status: DC | PRN

## 2021-07-02 MED ORDER — PREGABALIN 75 MG PO CAPS
75.0000 mg | ORAL_CAPSULE | Freq: Two times a day (BID) | ORAL | Status: DC
  Administered 2021-07-02 – 2021-07-05 (×7): 75 mg via ORAL
  Filled 2021-07-02 (×6): qty 1
  Filled 2021-07-02: qty 3

## 2021-07-02 MED ORDER — VITAMIN D 25 MCG (1000 UNIT) PO TABS
5000.0000 [IU] | ORAL_TABLET | Freq: Every day | ORAL | Status: DC
  Administered 2021-07-02 – 2021-07-04 (×3): 5000 [IU] via ORAL
  Filled 2021-07-02 (×3): qty 5

## 2021-07-02 MED ORDER — ALFUZOSIN HCL ER 10 MG PO TB24
10.0000 mg | ORAL_TABLET | Freq: Every evening | ORAL | Status: DC
  Administered 2021-07-02 – 2021-07-05 (×4): 10 mg via ORAL
  Filled 2021-07-02 (×4): qty 1

## 2021-07-02 MED ORDER — INSULIN ASPART 100 UNIT/ML IJ SOLN
0.0000 [IU] | Freq: Three times a day (TID) | INTRAMUSCULAR | Status: DC
  Administered 2021-07-02 – 2021-07-03 (×4): 2 [IU] via SUBCUTANEOUS
  Administered 2021-07-04: 5 [IU] via SUBCUTANEOUS
  Administered 2021-07-04: 2 [IU] via SUBCUTANEOUS
  Administered 2021-07-04: 1 [IU] via SUBCUTANEOUS
  Administered 2021-07-05: 2 [IU] via SUBCUTANEOUS
  Administered 2021-07-05: 5 [IU] via SUBCUTANEOUS

## 2021-07-02 MED ORDER — ATORVASTATIN CALCIUM 40 MG PO TABS
40.0000 mg | ORAL_TABLET | Freq: Every day | ORAL | Status: DC
  Administered 2021-07-02 – 2021-07-05 (×4): 40 mg via ORAL
  Filled 2021-07-02 (×4): qty 1

## 2021-07-02 MED ORDER — ACETAMINOPHEN 325 MG PO TABS
650.0000 mg | ORAL_TABLET | Freq: Three times a day (TID) | ORAL | Status: DC
  Administered 2021-07-02 – 2021-07-05 (×10): 650 mg via ORAL
  Filled 2021-07-02 (×11): qty 2

## 2021-07-02 MED ORDER — VITAMIN B-12 1000 MCG PO TABS
1000.0000 ug | ORAL_TABLET | Freq: Every day | ORAL | Status: DC
  Administered 2021-07-02 – 2021-07-05 (×4): 1000 ug via ORAL
  Filled 2021-07-02 (×4): qty 1

## 2021-07-02 MED ORDER — PANTOPRAZOLE SODIUM 20 MG PO TBEC
20.0000 mg | DELAYED_RELEASE_TABLET | Freq: Every day | ORAL | Status: DC
  Administered 2021-07-02 – 2021-07-05 (×4): 20 mg via ORAL
  Filled 2021-07-02 (×5): qty 1

## 2021-07-02 MED ORDER — LACTATED RINGERS IV SOLN: INTRAVENOUS | Status: DC

## 2021-07-02 MED ORDER — ENZALUTAMIDE 80 MG PO TABS
80.0000 mg | ORAL_TABLET | Freq: Every evening | ORAL | Status: DC
Start: 2021-07-02 — End: 2021-07-05
  Administered 2021-07-02 – 2021-07-05 (×4): 80 mg via ORAL
  Filled 2021-07-02 (×4): qty 1

## 2021-07-02 MED ORDER — VENLAFAXINE HCL ER 37.5 MG PO CP24
37.5000 mg | ORAL_CAPSULE | Freq: Every morning | ORAL | Status: DC
  Administered 2021-07-02 – 2021-07-05 (×4): 37.5 mg via ORAL
  Filled 2021-07-02 (×4): qty 1

## 2021-07-02 MED ORDER — HYDROCODONE-ACETAMINOPHEN 5-325 MG PO TABS
1.0000 | ORAL_TABLET | Freq: Four times a day (QID) | ORAL | Status: DC | PRN
Start: 2021-07-02 — End: 2021-07-04
  Administered 2021-07-02 – 2021-07-03 (×3): 1 via ORAL
  Filled 2021-07-02 (×3): qty 1

## 2021-07-02 MED ORDER — TOPIRAMATE 25 MG PO TABS
50.0000 mg | ORAL_TABLET | Freq: Two times a day (BID) | ORAL | Status: DC
  Administered 2021-07-02 – 2021-07-05 (×7): 50 mg via ORAL
  Filled 2021-07-02 (×7): qty 2

## 2021-07-02 MED ORDER — FINASTERIDE 5 MG PO TABS
5.0000 mg | ORAL_TABLET | Freq: Every day | ORAL | Status: DC
  Administered 2021-07-02 – 2021-07-05 (×4): 5 mg via ORAL
  Filled 2021-07-02 (×4): qty 1

## 2021-07-02 NOTE — ED Notes (Signed)
Report given to Carollee Massed, RN [phone]

## 2021-07-02 NOTE — Progress Notes (Signed)
Occupational Therapy Evaluation Patient Details Name: Jared Bridges MRN: [id number] DOB: Jul 30, 1930 Today's Date: 07/02/2021    History of Present Illness 87 yo s/p fall at home brought to [facility] and discharged home. REturned same day of DC due to BUE numbness and generalized weakness in BLE. Transferred to [facility] ED. Cervical MRI+ dorsal epidi=ural hematoma from C5-T1 with mild cord compression; Focal tear of ligamentum flavum at C6-7; Fx C6-7 is acute or early subacute; fx anterior vertebral osteophyte at C6-7.PMH: CKD,Depression, GI Bleed, PN, PUD, CVA(residual trouble reading/writing), DM, TIA, HTN, syncope.   Clinical Impression   PTA pt lives at home with his wife and daughter who assist with IADL tasks and LB ADL. Pt is modified independent with mobility @ RW/rollator level. PT with BUE/LE weakness and numbness especially in B hands and feet, significantly impacting his functional level of independence as noted below. Recommend CIR for rehab . Will follow acutely.    Follow Up Recommendations  CIR;Supervision/Assistance - 24 hour    Equipment Recommendations  3 in 1 bedside commode    Recommendations for Other Services       Precautions / Restrictions Precautions Precautions: Fall;Cervical Precaution Booklet Issued: No Required Braces or Orthoses: Cervical Brace Cervical Brace: Hard collar;At all times      Mobility Bed Mobility Overal bed mobility: Needs Assistance Bed Mobility: Sidelying to Sit;Sit to Sidelying   Sidelying to sit: Mod assist;+2 for physical assistance     Sit to sidelying: Max assist;+2 for physical assistance      Transfers Overall transfer level: Needs assistance   Transfers: Sit to/from Stand Sit to Stand: Mod assist;+2 safety/equipment         General transfer comment: steps to L mod A +2    Balance Overall balance assessment: Needs assistance;History of Falls   Sitting balance-Leahy Scale: Fair       Standing balance-Leahy Scale:  Poor                             ADL either performed or assessed with clinical judgement   ADL Overall ADL's : Needs assistance/impaired Eating/Feeding: Minimal assistance   Grooming: Moderate assistance   Upper Body Bathing: Moderate assistance   Lower Body Bathing: Maximal assistance;Sit to/from stand   Upper Body Dressing : Moderate assistance   Lower Body Dressing: Maximal assistance;Sit to/from stand   Toilet Transfer: Moderate assistance;+2 for physical assistance   Toileting- Clothing Manipulation and Hygiene: Maximal assistance Toileting - Clothing Manipulation Details (indicate cue type and reason): wears brief; incontinence?     Functional mobility during ADLs: Moderate assistance;Cueing for safety;+2 for physical assistance       Vision Baseline Vision/History: No visual deficits       Perception     Praxis      Pertinent Vitals/Pain Pain Assessment: Faces Faces Pain Scale: Hurts even more Pain Location: neck/chest/sternum Pain Descriptors / Indicators: Grimacing;Aching;Guarding Pain Intervention(s): Limited activity within patient's tolerance;Repositioned     Hand Dominance Right   Extremity/Trunk Assessment Upper Extremity Assessment Upper Extremity Assessment: RUE deficits/detail;LUE deficits/detail RUE Deficits / Details: R hand numbness; limited shoulder ROM; interferring with function; will further assess LUE Deficits / Details: L hand numbness; limited shoulder ROM; interferring with function   Lower Extremity Assessment Lower Extremity Assessment: Defer to PT evaluation   Cervical / Trunk Assessment Cervical / Trunk Assessment: Kyphotic;Other exceptions (forward head)   Communication Communication Communication: HOH   Cognition Arousal/Alertness: Awake/alert Behavior During  Therapy: WFL for tasks assessed/performed Overall Cognitive Status: No family/caregiver present to determine baseline cognitive functioning                                  General Comments: Pt alert and oriented however was unaware of being at [facility]; knew [facility] had [facility] at [facility]   General Comments  Mepalex padding placed under aspen collar to reduce pressure @ collar bone    Exercises     Shoulder Instructions      Home Living Family/patient expects to be discharged to:: Private residence Living Arrangements: Spouse/significant other;Children Available Help at Discharge: Family;Available 24 hours/day Type of Home: House Home Access: Stairs to enter [employer] of Steps: 5 Entrance Stairs-Rails: Right;Left;Can reach both Home Layout: Two level;Bed/bath upstairs Alternate Level Stairs-Number of Steps: 17 Alternate Level Stairs-Rails: Right Bathroom Shower/Tub: Teacher, early years/pre: Handicapped height Bathroom Accessibility: Yes How Accessible: Accessible via walker Home Equipment: [place] - 2 wheels;Walker - 4 wheels;Shower seat          Prior Functioning/Environment Level of Independence: Needs assistance;Independent with assistive device(s)  Gait / Transfers Assistance Needed: modified independebt with rollator/RW ADL's / Homemaking Assistance Needed: daughter assisted with LB ADL and IADL tasks Communication / Swallowing Assistance Needed: HOH          OT Problem List: Decreased strength;Decreased range of motion;Decreased activity tolerance;Impaired balance (sitting and/or standing);Decreased coordination;Decreased safety awareness;Decreased knowledge of use of DME or AE;Impaired sensation;Impaired UE functional use;Pain      OT Treatment/Interventions: Self-care/ADL training;Therapeutic exercise;Neuromuscular education;DME and/or AE instruction;Therapeutic activities;Patient/family education;Balance training    OT Goals(Current goals can be found in the care plan section) Acute Rehab OT Goals Patient Stated Goal: to get stronger OT Goal Formulation: With patient Time For  Goal Achievement: 07/16/21 Potential to Achieve Goals: Good  OT Frequency: Min 2X/week   Barriers to D/C:            Co-evaluation PT/OT/SLP Co-Evaluation/Treatment: Yes Reason for Co-Treatment: For patient/therapist safety;To address functional/ADL transfers   OT goals addressed during session: ADL's and self-care      AM-PAC OT "6 Clicks" Daily Activity     Outcome Measure Help from another person eating meals?: A Little Help from another person taking care of personal grooming?: A Lot Help from another person toileting, which includes using toliet, bedpan, or urinal?: A Lot Help from another person bathing (including washing, rinsing, drying)?: A Lot Help from another person to put on and taking off regular upper body clothing?: A Lot Help from another person to put on and taking off regular lower body clothing?: A Lot 6 Click Score: 13   End of Session Nurse Communication: Mobility status (pt wanting to call his family)  Activity Tolerance: Patient tolerated treatment well Patient left: in bed;with call bell/phone within reach (stretcher)  OT Visit Diagnosis: Unsteadiness on feet (R26.81);Other abnormalities of gait and mobility (R26.89);Repeated falls (R29.6);Muscle weakness (generalized) (M62.81);Pain Pain - part of body:  (neck)                Time: [id number] OT Time Calculation (min): 29 min Charges:  OT General Charges $OT Visit: 1 Visit OT Evaluation $OT Eval Moderate Complexity: [place], OT/L   Acute OT Clinical Specialist Acute Rehabilitation Services Pager [phone] Office [phone]   [facility] 07/02/2021, 12:14 PM

## 2021-07-02 NOTE — Progress Notes (Signed)
07/02/21 1222  PT Visit Information  Last PT Received On 07/02/21  Assistance Needed +2  PT/OT/SLP Co-Evaluation/Treatment Yes  Reason for Co-Treatment For patient/therapist safety;To address functional/ADL transfers  PT goals addressed during session Mobility/safety with mobility;Balance  History of Present Illness 87 yo s/p fall at home on 8/1; brought to [facility] and discharged home. REturned same day of DC due to BUE numbness and generalized weakness in BLE. Transferred to [facility] ED. Cervical MRI+ dorsal epidural hematoma from C5-T1 with mild cord compression; Focal tear of ligamentum flavum at C6-7; Fx C6-7 is acute or early subacute; fx anterior vertebral osteophyte at C6-7.PMH: CKD,Depression, GI Bleed, PN, PUD, CVA(residual trouble reading/writing), DM, TIA, HTN, syncope.  Precautions  Precautions Fall;Cervical  Precaution Booklet Issued No  Required Braces or Orthoses Cervical Brace  Cervical Brace Hard collar;At all times  Restrictions  Weight Bearing Restrictions No  Home Living  Family/patient expects to be discharged to: Private residence  Living Arrangements Spouse/significant other;Children  Available Help at Discharge Family;Available 24 hours/day  Type of Home House  Home Access Stairs to enter  Entrance Stairs-Number of Steps 5  Entrance Stairs-Rails Right;Left;Can reach both  Home Layout Two level;Bed/bath upstairs  Alternate Level Stairs-Number of Steps 17  Alternate Level Stairs-Rails Right  Bathroom Shower/Tub Tub/shower unit  Bathroom Toilet Handicapped height  Bathroom Accessibility Yes  Home Equipment Walker - 2 wheels;Walker - 4 wheels;Shower seat  Prior Function  Level of Independence Needs assistance;Independent with assistive device(s)  Gait / Transfers Assistance Needed modified independent with rollator/RW  ADL's / [place] daughter assisted with LB ADL and IADL tasks  Communication / Swallowing Assistance Needed HOH  Communication   Communication HOH  Pain Assessment  Pain Assessment Faces  Faces Pain Scale 6  Pain Location neck/chest/sternum  Pain Descriptors / Indicators Grimacing;Aching;Guarding  Pain Intervention(s) Limited activity within patient's tolerance;Monitored during session;Repositioned  Cognition  Arousal/Alertness Awake/alert  Behavior During Therapy WFL for tasks assessed/performed  Overall Cognitive Status No family/caregiver present to determine baseline cognitive functioning  General Comments Pt alert and oriented however was unaware of being at [facility]; knew he had been at [facility]  Upper Extremity Assessment  Upper Extremity Assessment Defer to OT evaluation  Lower Extremity Assessment  Lower Extremity Assessment Generalized weakness;RLE deficits/detail;LLE deficits/detail  RLE Deficits / Details Reporting numbness from feet to knee; but reports this has been going on for awhile  LLE Deficits / Details Reporting numbness from feet to knee, but reports this has been going on for a long time  Cervical / Trunk Assessment  Cervical / Trunk Assessment Kyphotic;Other exceptions (forward head)  Bed Mobility  Overal bed mobility Needs Assistance  Bed Mobility Sidelying to Sit;Sit to Sidelying  Sidelying to sit Mod assist;+2 for physical assistance  Sit to sidelying Max assist;+2 for physical assistance  General bed mobility comments Required assist for trunk and LEs. Cues for log roll technique  Transfers  Overall transfer level Needs assistance  Equipment used 2 person hand held assist  Transfers Sit to/from Stand  Sit to Stand Mod assist;+2 safety/equipment  General transfer comment mod A +2 for lift assist and steadying. Was able to take steps at EOB with mod A +2. Required cues for sequencing and difficulty noted to take steps with LLE>RLE  Balance  Overall balance assessment Needs assistance;History of Falls  Sitting-balance support Feet supported  Sitting balance-Leahy Scale Fair  Standing  balance support Bilateral upper extremity supported  Standing balance-Leahy Scale Poor  Standing balance  comment Reliant on external support  General Comments  General comments (skin integrity, edema, etc.) Mepalex padding placed under aspen collar to reduce pressure @ collar bone  PT - End of Session  Activity Tolerance Patient tolerated treatment well  Patient left in bed (on stretcher in ED in hallway)  Nurse Communication Mobility status;Other (comment) (pt requesting to use a phone)  PT Assessment  PT Recommendation/Assessment Patient needs continued PT services  PT Visit Diagnosis Unsteadiness on feet (R26.81);Muscle weakness (generalized) (M62.81);History of falling (Z91.81);Repeated falls (R29.6)  PT Problem List Decreased strength;Decreased activity tolerance;Decreased balance;Decreased mobility;Decreased cognition;Decreased knowledge of use of DME;Decreased safety awareness;Decreased knowledge of precautions  PT Plan  PT Frequency (ACUTE ONLY) Min 3X/week  PT Treatment/Interventions (ACUTE ONLY) DME instruction;Gait training;Functional mobility training;Therapeutic activities;Therapeutic exercise;Balance training;Patient/family education;Cognitive remediation;Neuromuscular re-education  AM-PAC PT "6 Clicks" Mobility Outcome Measure (Version 2)  Help needed turning from your back to your side while in a flat bed without using bedrails? 1  Help needed moving from lying on your back to sitting on the side of a flat bed without using bedrails? 1  Help needed moving to and from a bed to a chair (including a wheelchair)? 1  Help needed standing up from a chair using your arms (e.g., wheelchair or bedside chair)? 1  Help needed to walk in hospital room? 1  Help needed climbing 3-5 steps with a railing?  1  6 Click Score 6  Consider Recommendation of Discharge To: CIR/SNF/LTACH  Progressive Mobility  What is the highest level of mobility based on the progressive mobility assessment?  Level 3 (Stands with assist) - Balance while standing  and cannot march in place  Mobility Out of bed for toileting;Out of bed to chair with meals  PT Recommendation  Follow Up Recommendations CIR;Supervision/Assistance - 24 hour  PT equipment Other (comment) (TBD)  Individuals Consulted  Consulted and Agree with Results and Recommendations Patient  Acute Rehab PT Goals  Patient Stated Goal to get stronger  PT Goal Formulation With patient  Time For Goal Achievement 07/16/21  Potential to Achieve Goals Good  PT Time Calculation  PT Start Time (ACUTE ONLY) 1115  PT Stop Time (ACUTE ONLY) 1144  PT Time Calculation (min) (ACUTE ONLY) 29 min  PT General Charges  $$ ACUTE PT VISIT 1 Visit  PT Evaluation  $PT Eval Moderate Complexity 1 Mod  Written Expression  Dominant Hand Right   Pt admitted secondary to problem above with deficits above. Requiring mod to max A +2 for bed mobility and mod A +2 to stand and take side steps at EOB. Pt with increased weakness in extremities and poor balance. Currently at increased risk for falls. Recommending CIR level therapies to increase independence and safety with mobility. Pt with good family assist at home. Will continue to follow acutely.   Reuel Derby, PT, DPT  Acute Rehabilitation Services  Pager: [phone] Office: [phone]

## 2021-07-02 NOTE — Progress Notes (Signed)
PROGRESS NOTE    Jared Bridges  [id number] DOB: 1930-08-16 DOA: 07/01/2021 PCP: Doree Albee, MD  Brief Narrative: 87 year old male with history of prostate cancer, bony mets, type 2 diabetes mellitus, hypertension, dyslipidemia, hearing impairment presented to [name], ED after mechanical fall, slipping on soap, was deemed stable discharged home return to ED the same day due to upper extremity numbness and some pain, repeat CT C-spine was abnormal subsequently transferred to [facility] for MRI. Neurosurgery recommendations  Assessment & Plan:   C 6/C7 fracture/mechanical fall Small epidural hematoma -EDP discussed with neurosurgery Dr. Christella Noa, recommended nonoperative management, Aspen collar, PT OT, conservative management -Physical therapy evaluation -Follow-up with neurosurgery in few weeks  Type 2 diabetes mellitus -CBGs are stable, sliding scale insulin  Chronic diastolic CHF -Clinically euvolemic  Metastatic prostate cancer -Resume Xtandi  BPH -Continue Proscar  Generalized weakness/fall -Physical therapy  Home meds -Resume Lexapro, Protonix, Lyrica, Topamax, Effexor  DVT prophylaxis: SCDs Code Status: DNR Family Communication: Discussed patient in detail, no family at bedside Disposition Plan:  Status is: Inpatient  Remains inpatient appropriate because:Inpatient level of care appropriate due to severity of illness  Dispo:  Patient From: Home  Planned Disposition: Home versus rehab  Medically stable for discharge: No     Consultants:  Discussed with neurosurgery Dr. Christella Noa on admission  Procedures:   Antimicrobials:    Subjective: -Complains of some neck discomfort, overall feels okay  Objective: Vitals:   07/02/21 1230 07/02/21 1245 07/02/21 1300 07/02/21 1415  BP: 136/69 (!) 131/94  (!) 171/71  Pulse: 78 83  77  Resp: 17 18    Temp:   98.5 F (36.9 C) 98.5 F (36.9 C)  TempSrc:   Tympanic Tympanic  SpO2: 95% 95%  98%   Weight:      Height:        Intake/Output Summary (Last 24 hours) at 07/02/2021 1525 Last data filed at 07/02/2021 [id number] Gross per 24 hour  Intake 126.71 ml  Output --  Net 126.71 ml   Filed Weights   07/01/21 1952  Weight: 95 kg    Examination:  General exam: Pleasant elderly male laying in bed, AAOx3, no distress HEENT: C-collar CVS: S1-S2, regular rate rhythm Lungs: Clear bilaterally Abdomen: Soft, nontender, bowel sounds present Extremities: No edema Neuro: Moves all extremities, no localizing signs Skin: As above Psychiatry: Judgement and insight appear normal. Mood & affect appropriate.     Data Reviewed:   CBC: Recent Labs  Lab 07/01/21 2138 07/02/21 0652  WBC 12.2* 10.3  NEUTROABS 10.7*  --   HGB 11.9* 11.5*  HCT 37.6* 34.8*  MCV 99.7 96.1  PLT 193 [id number]   Basic Metabolic Panel: Recent Labs  Lab 07/01/21 2138 07/02/21 0652  NA 138 138  K 4.7 4.2  CL 109 107  CO2 19* 23  GLUCOSE 182* 156*  BUN 21 20  CREATININE 1.09 1.10  CALCIUM 8.4* 8.5*   GFR: Estimated Creatinine Clearance: 52.9 mL/min (by C-G formula based on SCr of 1.1 mg/dL). Liver Function Tests: Recent Labs  Lab 07/01/21 2138  AST 31  ALT 17  ALKPHOS 81  BILITOT 0.9  PROT 7.4  ALBUMIN 3.8   No results for input(s): LIPASE, AMYLASE in the last 168 hours. No results for input(s): AMMONIA in the last 168 hours. Coagulation Profile: Recent Labs  Lab 07/01/21 2138  INR 1.1   Cardiac Enzymes: No results for input(s): CKTOTAL, CKMB, CKMBINDEX, TROPONINI in the last 168 hours. BNP (last  3 results) No results for input(s): PROBNP in the last 8760 hours. HbA1C: No results for input(s): HGBA1C in the last 72 hours. CBG: No results for input(s): GLUCAP in the last 168 hours. Lipid Profile: No results for input(s): CHOL, HDL, LDLCALC, TRIG, CHOLHDL, LDLDIRECT in the last 72 hours. Thyroid Function Tests: Recent Labs    07/01/21 2138  TSH 1.246   Anemia Panel: No results for  input(s): VITAMINB12, FOLATE, FERRITIN, TIBC, IRON, RETICCTPCT in the last 72 hours. Urine analysis:    Component Value Date/Time   COLORURINE YELLOW 09/04/2020 1136   APPEARANCEUR HAZY (A) 09/04/2020 1136   LABSPEC 1.016 09/04/2020 1136   PHURINE 7.0 09/04/2020 1136   GLUCOSEU NEGATIVE 09/04/2020 1136   HGBUR NEGATIVE 09/04/2020 [name] 09/04/2020 1136   BILIRUBINUR negative 04/19/2019 [place] 09/04/2020 1136   PROTEINUR NEGATIVE 09/04/2020 1136   UROBILINOGEN 0.2 04/19/2019 1354   UROBILINOGEN 1.0 05/21/2015 1050   NITRITE NEGATIVE 09/04/2020 1136   LEUKOCYTESUR NEGATIVE 09/04/2020 1136   Sepsis Labs: '@LABRCNTIP'$ (procalcitonin:4,lacticidven:4)  ) Recent Results (from the past 240 hour(s))  Resp Panel by RT-PCR (Flu A&B, Covid) Nasopharyngeal Swab     Status: None   Collection Time: 07/01/21  8:51 PM   Specimen: Nasopharyngeal Swab; Nasopharyngeal(NP) swabs in vial transport medium  Result Value Ref Range Status   SARS Coronavirus 2 by RT PCR NEGATIVE NEGATIVE Final    Comment: (NOTE) SARS-CoV-2 target nucleic acids are NOT DETECTED.  The SARS-CoV-2 RNA is generally detectable in upper respiratory specimens during the acute phase of infection. The lowest concentration of SARS-CoV-2 viral copies this assay can detect is 138 copies/mL. A negative result does not preclude SARS-Cov-2 infection and should not be used as the sole basis for treatment or other patient management decisions. A negative result may occur with  improper specimen collection/handling, submission of specimen other than nasopharyngeal swab, presence of viral mutation(s) within the areas targeted by this assay, and inadequate number of viral copies(<138 copies/mL). A negative result must be combined with clinical observations, patient history, and epidemiological information. The expected result is Negative.  Fact Sheet for Patients:   EntrepreneurPulse.com.au  Fact Sheet for Healthcare Providers:  IncredibleEmployment.be  This test is no t yet approved or cleared by the [place] FDA and  has been authorized for detection and/or diagnosis of SARS-CoV-2 by FDA under an Emergency Use Authorization (EUA). This EUA will remain  in effect (meaning this test can be used) for the duration of the COVID-19 declaration under Section 564(b)(1) of the Act, 21 U.S.C.section 360bbb-3(b)(1), unless the authorization is terminated  or revoked sooner.       Influenza A by PCR NEGATIVE NEGATIVE Final   Influenza B by PCR NEGATIVE NEGATIVE Final    Comment: (NOTE) The Xpert Xpress SARS-CoV-2/FLU/RSV plus assay is intended as an aid in the diagnosis of influenza from Nasopharyngeal swab specimens and should not be used as a sole basis for treatment. Nasal washings and aspirates are unacceptable for Xpert Xpress SARS-CoV-2/FLU/RSV testing.  Fact Sheet for Patients: EntrepreneurPulse.com.au  Fact Sheet for Healthcare Providers: IncredibleEmployment.be  This test is not yet approved or cleared by the [place] FDA and has been authorized for detection and/or diagnosis of SARS-CoV-2 by FDA under an Emergency Use Authorization (EUA). This EUA will remain in effect (meaning this test can be used) for the duration of the COVID-19 declaration under Section 564(b)(1) of the Act, 21 U.S.C. section 360bbb-3(b)(1), unless the authorization is terminated  or revoked.  Performed at [facility], [address], [place], [place] [address]          Radiology Studies: CT HEAD WO CONTRAST (5MM)  Result Date: 07/01/2021 CLINICAL DATA:  Head trauma, minor (Age >= 65y) Generalized weakness. Fall since being evaluated earlier today for a prior fall. EXAM: CT HEAD WITHOUT CONTRAST TECHNIQUE: Contiguous axial images were obtained from the base of the skull through  the vertex without intravenous contrast. COMPARISON:  Head CT earlier today. FINDINGS: Brain: Stable atrophy and chronic small vessel ischemia. No intracranial hemorrhage, mass effect, or midline shift. No hydrocephalus. The basilar cisterns are patent. No evidence of territorial infarct or acute ischemia. No extra-axial or intracranial fluid collection. Vascular: Atherosclerosis of skullbase vasculature without hyperdense vessel or abnormal calcification. Skull: No fracture or focal lesion. Sinuses/Orbits: Paranasal sinuses and mastoid air cells are clear. Remote medial right orbital wall fracture again seen. Bilateral cataract resection. Other: Left occipital scalp contusion slightly improved from earlier today. IMPRESSION: 1. Small left occipital scalp hematoma. No acute intracranial abnormality. No skull fracture. 2. Stable atrophy and chronic small vessel ischemia. Electronically Signed   By: Keith Rake M.D.   On: 07/01/2021 21:20   CT Head Wo Contrast  Result Date: 07/01/2021 CLINICAL DATA:  Head trauma, mod-severe; Neck trauma (Age >= 65y). Tripped over mat and fell today hitting the back of the head. Bilateral shoulder and neck pain. EXAM: CT HEAD WITHOUT CONTRAST CT CERVICAL SPINE WITHOUT CONTRAST TECHNIQUE: Multidetector CT imaging of the head and cervical spine was performed following the standard protocol without intravenous contrast. Multiplanar CT image reconstructions of the cervical spine were also generated. COMPARISON:  Head CT 12/28/2017. Head MRI 12/29/2017. Chest CT 04/25/2015. FINDINGS: CT HEAD FINDINGS Brain: There is no evidence of an acute infarct, intracranial hemorrhage, mass, midline shift, or extra-axial fluid collection. Patchy hypodensities in the cerebral white matter bilaterally may have mildly progressed from the prior CT and are nonspecific but compatible with moderate chronic small vessel ischemic disease. There is mild cerebral atrophy. Vascular: Calcified  atherosclerosis at the skull base. No hyperdense vessel. Skull: No acute fracture or suspicious osseous lesion. Sinuses/Orbits: Visualized paranasal sinuses are clear. Small chronic left mastoid effusion. Chronic medial right orbital fracture. Bilateral cataract extraction. Other: Left occipital scalp contusion. CT CERVICAL SPINE FINDINGS Alignment: Cervical spine straightening.  No listhesis. Skull base and vertebrae: Widespread interbody ankylosis is present in the cervical and included upper thoracic spine. A fracture through an anterior vertebral osteophyte at C6-7 is new from 2016 but favored to be nonacute. No posterior element fracture or suspicious osseous lesion is identified. Soft tissues and spinal canal: No prevertebral fluid or swelling. No visible canal hematoma. Disc levels: Widespread ankylosis. No evidence of high-grade spinal canal stenosis. Multilevel neural foraminal stenosis due to facet and uncovertebral hypertrophy including severe left neural foraminal stenosis at C3-4 and C4-5. Upper chest: Clear lung apices. Other: Mild carotid atherosclerosis. Small thyroid nodules measuring up to approximately 1 cm for which no imaging follow-up is recommended. IMPRESSION: 1. No evidence of acute intracranial abnormality. 2. Moderate chronic small vessel ischemic disease. 3. Left occipital scalp contusion. 4. 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 If there is clinical concern for an acute fracture or acute soft tissue injury in the cervical spine, consider MRI for further evaluation. Electronically Signed   By: Logan Bores M.D.   On: 07/01/2021 14:40  CT Cervical Spine Wo Contrast  Result Date: 07/01/2021 CLINICAL DATA:  Neck trauma (Age >= 65y) Generalized weakness, patient can walk since fall. Fall occurred after being evaluated for a prior fall earlier today. EXAM: CT CERVICAL SPINE  WITHOUT CONTRAST TECHNIQUE: Multidetector CT imaging of the cervical spine was performed without intravenous contrast. Multiplanar CT image reconstructions were also generated. COMPARISON:  Cervical spine CT earlier today. FINDINGS: Alignment: Straightening of normal lordosis. No traumatic subluxation. Skull base and vertebrae: No acute fracture. Flowing anterior osteophytes in bony ankylosis. Fracture through an anterior vertebral osteophyte at C6-C7 is unchanged from earlier today and felt to be chronic. Dens and skull base are intact. Soft tissues and spinal canal: No prevertebral fluid or swelling. No visible canal hematoma. Disc levels: Diffuse thoracic ankylosis. Stable facet hypertrophy. No change from earlier today. Upper chest: Nonacute. Other: None. IMPRESSION: 1. No acute fracture or subluxation of the cervical spine. 2. Stable exam from CT earlier today. Again seen diffuse cervical ankylosis with fracture through the anterior vertebral osteophyte at C6-C7. This is favored to be chronic, but if there is clinical concern for acute fracture, MRI is again recommended. Electronically Signed   By: Keith Rake M.D.   On: 07/01/2021 21:17   CT Cervical Spine Wo Contrast  Result Date: 07/01/2021 CLINICAL DATA:  Head trauma, mod-severe; Neck trauma (Age >= 65y). Tripped over mat and fell today hitting the back of the head. Bilateral shoulder and neck pain. EXAM: CT HEAD WITHOUT CONTRAST CT CERVICAL SPINE WITHOUT CONTRAST TECHNIQUE: Multidetector CT imaging of the head and cervical spine was performed following the standard protocol without intravenous contrast. Multiplanar CT image reconstructions of the cervical spine were also generated. COMPARISON:  Head CT 12/28/2017. Head MRI 12/29/2017. Chest CT 04/25/2015. FINDINGS: CT HEAD FINDINGS Brain: There is no evidence of an acute infarct, intracranial hemorrhage, mass, midline shift, or extra-axial fluid collection. Patchy hypodensities in the cerebral  white matter bilaterally may have mildly progressed from the prior CT and are nonspecific but compatible with moderate chronic small vessel ischemic disease. There is mild cerebral atrophy. Vascular: Calcified atherosclerosis at the skull base. No hyperdense vessel. Skull: No acute fracture or suspicious osseous lesion. Sinuses/Orbits: Visualized paranasal sinuses are clear. Small chronic left mastoid effusion. Chronic medial right orbital fracture. Bilateral cataract extraction. Other: Left occipital scalp contusion. CT CERVICAL SPINE FINDINGS Alignment: Cervical spine straightening.  No listhesis. Skull base and vertebrae: Widespread interbody ankylosis is present in the cervical and included upper thoracic spine. A fracture through an anterior vertebral osteophyte at C6-7 is new from 2016 but favored to be nonacute. No posterior element fracture or suspicious osseous lesion is identified. Soft tissues and spinal canal: No prevertebral fluid or swelling. No visible canal hematoma. Disc levels: Widespread ankylosis. No evidence of high-grade spinal canal stenosis. Multilevel neural foraminal stenosis due to facet and uncovertebral hypertrophy including severe left neural foraminal stenosis at C3-4 and C4-5. Upper chest: Clear lung apices. Other: Mild carotid atherosclerosis. Small thyroid nodules measuring up to approximately 1 cm for which no imaging follow-up is recommended. IMPRESSION: 1. No evidence of acute intracranial abnormality. 2. Moderate chronic small vessel ischemic disease. 3. Left occipital scalp contusion. 4. 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 If there is clinical concern for an acute fracture or acute soft tissue injury in the cervical spine, consider MRI for further evaluation. Electronically Signed   By: Logan Bores  M.D.   On: 07/01/2021 14:40   MR BRAIN WO CONTRAST  Result  Date: 07/02/2021 CLINICAL DATA:  Generalized weakness and fall EXAM: MRI HEAD WITHOUT CONTRAST TECHNIQUE: Multiplanar, multiecho pulse sequences of the brain and surrounding structures were obtained without intravenous contrast. COMPARISON:  12/29/2017 FINDINGS: Brain: No acute infarct, mass effect or extra-axial collection. No acute or chronic hemorrhage. Hyperintense T2-weighted signal is moderately widespread throughout the white matter. Generalized volume loss without a clear lobar predilection. The midline structures are normal. Vascular: Major flow voids are preserved. Skull and upper cervical spine: Normal calvarium and skull base. Visualized upper cervical spine and soft tissues are normal. Sinuses/Orbits:No paranasal sinus fluid levels or advanced mucosal thickening. No mastoid or middle ear effusion. Normal orbits. IMPRESSION: 1. No acute intracranial abnormality. 2. Moderate chronic microvascular ischemic disease and generalized volume loss. Electronically Signed   By: Ulyses Jarred M.D.   On: 07/02/2021 00:19   MR Cervical Spine Wo Contrast  Result Date: 07/02/2021 CLINICAL DATA:  Fall EXAM: MRI CERVICAL SPINE WITHOUT CONTRAST TECHNIQUE: Multiplanar, multisequence MR imaging of the cervical spine was performed. No intravenous contrast was administered. COMPARISON:  None. FINDINGS: Alignment: Physiologic. Vertebrae: There is diffuse cervical ankylosis with a fractured anterior vertebral osteophyte at C6-7. There is mild C6-7 endplate edema and edema and disc space. There is a focal tear of ligamentum flavum at the C6-7 level. Cord: There is dorsal epidural hematoma extending from C5-T1 measuring 8 mm in AP thickness. This severely attenuates the thecal sac and causes mild compression of the spinal cord. Posterior Fossa, vertebral arteries, paraspinal tissues: Negative. Disc levels: Aside from the above described C6-7 disc space abnormalities, there is only mild degenerative disc disease. IMPRESSION: 1.  Dorsal epidural hematoma extending from C5-T1 measuring 8 mm in AP thickness, severely narrowing the thecal sac and mildly compressing the spinal cord. 2. Focal tear of the ligamentum flavum at C6-7. Fracture at C6-7 is likely acute or early subacute. 3. 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 Critical Value/emergent results were called by telephone at the time of interpretation on 07/02/2021 at 12:37 am to Dr. Betsey Holiday, who verbally acknowledged these results. Electronically Signed   By: Ulyses Jarred M.D.   On: 07/02/2021 00:38        Scheduled Meds:  acetaminophen  650 mg Oral TID   alfuzosin  10 mg Oral QPM   atorvastatin  40 mg Oral Daily   cholecalciferol  5,000 Units Oral QHS   finasteride  5 mg Oral Daily   pantoprazole  20 mg Oral Daily   pregabalin  75 mg Oral BID   topiramate  50 mg Oral BID   venlafaxine XR  37.5 mg Oral q morning   vitamin B-12  1,000 mcg Oral Daily   Continuous Infusions:   LOS: 0 days   Time spent: 53mn  PDomenic Polite MD [facility]   07/02/2021, 3:25 PM

## 2021-07-02 NOTE — H&P (Addendum)
History and Physical  Jared Bridges [id number] DOB: 1930-08-25 DOA: 07/01/2021  Referring physician: Valda Favia  PCP: Doree Albee, MD  Outpatient Specialists: Oncology, radiology, ortho, urology  Patient coming from: Home  Chief Complaint: Fall   HPI: Jared Bridges is a 87 y.o. male with medical history significant for prostate cancer with osseous metastasis, hearing impairment, type 2 diabetes, hypertension, hyperlipidemia, BPH, diabetic polyneuropathy who initially presented to [facility] ED after a mechanical fall at home.  History is mainly obtained from EDP and from review of medical records.  Patient is very hard of hearing.  No family member at bedside.  He tripped on wet floor and hit his head.  He was in his usual state of health prior to this.  No cardiopulmonary or GI symptoms.  EMS was activated.  He was brought in to [facility] ED, had CT head and cervical spine, was deemed stable, so was sent home.  He returned the same day due to numbness in his upper extremities bilaterally and some generalized weakness in his lower extremities bilaterally.  He had repeated CT head and cervical spine which were abnormal.  He was transferred ED to ED from [facility] ED to [facility] ED to have urgent MRI brain and MRI cervical spine.  EDP discussed the case with neurosurgery who recommended conservative management and overnight monitoring.  ED Course:  Temperature 98.3.  BP 192/82, pulse 86, respiratory 21, O2 saturation 100% on room air.  Lab studies remarkable for serum bicarb 19, glucose 182, BUN 21, creatinine 1.09, anion gap 10, GFR greater than 60.  WBC 12.2, hemoglobin 11.9, MCV 99.7, platelet count 193.  Review of Systems: Review of systems as noted in the HPI. All other systems reviewed and are negative.   Past Medical History:  Diagnosis Date   Anemia, iron deficiency    Chronic headaches    Migraines   Chronic kidney disease    Resolved   Depression    Diverticulitis    Essential  hypertension    GI bleed    Dr. Laural Golden - 1998   Hypercholesteremia    Orthostatic hypotension    Peptic ulcer disease    Peripheral neuropathy    Polymyalgia rheumatica ([name]    Stroke ([place])    Residual trouble reading and writing   Syncope    Thyroid nodule    TIA (transient ischemic attack) 12/2017   Type 2 diabetes mellitus ([place])    Past Surgical History:  Procedure Laterality Date   APPENDECTOMY     as child   BACK SURGERY     2000 IDET SPINAL PROC   CHOLECYSTECTOMY N/A 04/07/2016   Procedure: CHOLECYSTECTOMY;  Surgeon: Aviva Signs, MD;  Location: [facility] ORS;  Service: General;  Laterality: N/A;   [place]   COLONOSCOPY N/A 01/07/2018   Procedure: COLONOSCOPY;  Surgeon: Rogene Houston, MD;  Location: [facility] ENDO SUITE;  Service: Endoscopy;  Laterality: N/A;  1:00   CRYOTHERAPY     EYE SURGERY     right cataract with lens implant   FEMUR SURGERY     RT LEG        1953   HAND SURGERY     RIGHT   HERNIA REPAIR     1-right inguinal, 3- left inguinal   IRRIGATION AND DEBRIDEMENT KNEE Left 07/09/2015   Procedure: LEFT KNEE IRRIGATION AND DEBRIDEMENT WOUND CLOSURE;  Surgeon: Gaynelle Arabian, MD;  Location: [facility] ORS;  Service: Orthopedics;  Laterality: Left;   JOINT REPLACEMENT  02/28/2016   lt knee revision   KNEE DEBRIDEMENT     2016   LEFT   MENISCUS REPAIR     LEFT   POLYPECTOMY  01/07/2018   Procedure: POLYPECTOMY;  Surgeon: Rogene Houston, MD;  Location: [facility] END[facility];  Service: Endoscopy;;  colon    SHOULDER OPEN ROTATOR CUFF REPAIR     RT SHOULDER   SPINAL FUSION     SPINAL FUSION     TONSILLECTOMY     TOTAL KNEE ARTHROPLASTY Left 05/28/2015   Procedure: LEFT TOTAL KNEE ARTHROPLASTY;  Surgeon: Gaynelle Arabian, MD;  Location: [facility]S;  Service: Orthopedics;  Laterality: Left;   TOTAL KNEE REVISION Left 02/28/2016   Procedure: LEFT TOTAL KNEE REVISION;  Surgeon: Leandrew Koyanagi, MD;  Location: [place];  Service: Orthopedics;  Laterality: Left;    TRIGGER FINGER RELEASE     RT HAND   1990S   UVULECTOMY  2004   TURBINATE,TONSIL,ADENOID    Social History:  reports that he quit smoking about 34 years ago. His smoking use included cigarettes. He started smoking about 62 years ago. He has a 1.80 pack-year smoking history. He has never used smokeless tobacco. He reports that he does not drink alcohol and does not use drugs.   Allergies  Allergen Reactions   Iodine Hives, Itching and Other (See Comments)    very allergic(per daughter), can pre-med with benadryl   Calan [Verapamil] Other (See Comments)    Weakness   Cymbalta [Duloxetine Hcl] Other (See Comments)    Makes patient have jerking motions.   Iodinated Diagnostic Agents Hives, Itching and Other (See Comments)    Can pre-med with benadryl   Latex Other (See Comments)    Redness iritation   Lisinopril Other (See Comments)    Makes patient have jerking motions.   Neurontin [Gabapentin] Other (See Comments)    Makes patient have jerking motions.   Zoloft [Sertraline Hcl] Diarrhea and Other (See Comments)    Makes patient have jerking motions.   Valium [Diazepam] Other (See Comments)    "Did not work"   Flomax [Tamsulosin Hcl] Other (See Comments)    Dizziness   Glimepiride Other (See Comments)    "Did not work"   Melatonin Nausea Only   Tape Other (See Comments)    Skin irritation    Family History  Problem Relation Age of Onset   Colon cancer Sister    Hypertension Other    Depression Brother    Depression Daughter       Prior to Admission medications   Medication Sig Start Date End Date Taking? Authorizing Provider  alfuzosin (UROXATRAL) 10 MG 24 hr tablet Take 10 mg by mouth every evening. 11/15/20  Yes [provider]  atorvastatin (LIPITOR) 40 MG tablet TAKE 1 TABLET BY MOUTH DAILY 06/11/21  Yes Gosrani, Nimish C, MD  Cholecalciferol (VITAMIN D3) 5000 units CAPS Take 5,000 Units by mouth at bedtime.   Yes [provider]  Ciclopirox 0.77  % gel Apply 1 application topically 2 (two) times daily. to affected area(s) 05/06/21  Yes [provider]  diclofenac sodium (VOLTAREN) 1 % GEL Apply 2 g topically daily as needed (for pain). 11/22/15  Yes Dohmeier, Asencion Partridge, MD  enzalutamide Gillermina Phy) 80 MG tablet Take 80 mg by mouth daily. 11/05/20  Yes [provider]  escitalopram (LEXAPRO) 20 MG tablet Take 20 mg by mouth daily. 06/24/21  Yes [provider]  finasteride (PROSCAR) 5 MG tablet Take 1 tablet (5 mg total) by mouth daily. 04/18/20  Yes McKenzie, Candee Furbish, MD  fluocinonide cream (LIDEX) [id number] % Apply 1 application topically daily as needed (for irritation).   Yes [provider]  HM PAIN RELIEF EXTRA STRENGTH 500 MG tablet Take 1,000 mg by mouth every 8 (eight) hours as needed for pain. 03/08/21  Yes [provider]  midodrine (PROAMATINE) 10 MG tablet Take 10 mg by mouth 3 (three) times daily.    Yes [provider]  MYRBETRIQ 50 MG TB24 tablet Take 50 mg by mouth daily. 06/24/21  Yes [provider]  pantoprazole (PROTONIX) 20 MG tablet TAKE 1 TABLET BY MOUTH DAILY 06/11/21  Yes Rehman, Mechele Dawley, MD  pregabalin (LYRICA) 75 MG capsule Take 75 mg by mouth 2 (two) times daily.   Yes [provider]  topiramate (TOPAMAX) 50 MG tablet Take 50 mg by mouth 2 (two) times daily.   Yes [provider]  venlafaxine XR (EFFEXOR-XR) 37.5 MG 24 hr capsule TAKE ONE CAPSULE BY MOUTH EVERY MORNING 03/04/21  Yes Gosrani, Nimish C, MD  vitamin B-12 (CYANOCOBALAMIN) 500 MCG tablet Take 1,000 mcg by mouth daily.   Yes [provider]  fluticasone (FLONASE) 50 MCG/ACT nasal spray Place 2 sprays into both nostrils daily. Patient not taking: No sig reported 04/19/19   Stacey Drain [place], PA-C    Physical Exam: BP (!) 163/82   Pulse 77   Temp 98.1 F (36.7 C)   Resp 16   Ht '6\' 2"'$  (1.88 m)   Wt 95 kg   SpO2 99%   BMI 26.89 kg/m   General: 87 y.o. year-old male well  developed well nourished in no acute distress.  Alert and oriented x3.  Hard collar in place. Cardiovascular: Regular rate and rhythm with no rubs or gallops.  No thyromegaly or JVD noted.  No lower extremity edema. 2/4 pulses in all 4 extremities. Respiratory: Clear to auscultation with no wheezes or rales. Good inspiratory effort. Abdomen: Soft nontender nondistended with normal bowel sounds x4 quadrants. Muskuloskeletal: No cyanosis, clubbing or edema noted bilaterally Neuro: CN II-XII intact, strength, sensation, reflexes Skin: No ulcerative lesions noted or rashes Psychiatry: Judgement and insight appear normal. Mood is appropriate for condition and setting          Labs on Admission:  Basic Metabolic Panel: Recent Labs  Lab 07/01/21 2138  NA 138  K 4.7  CL 109  CO2 19*  GLUCOSE 182*  BUN 21  CREATININE 1.09  CALCIUM 8.4*   Liver Function Tests: Recent Labs  Lab 07/01/21 2138  AST 31  ALT 17  ALKPHOS 81  BILITOT 0.9  PROT 7.4  ALBUMIN 3.8   No results for input(s): LIPASE, AMYLASE in the last 168 hours. No results for input(s): AMMONIA in the last 168 hours. CBC: Recent Labs  Lab 07/01/21 2138  WBC 12.2*  NEUTROABS 10.7*  HGB 11.9*  HCT 37.6*  MCV 99.7  PLT 193   Cardiac Enzymes: No results for input(s): CKTOTAL, CKMB, CKMBINDEX, TROPONINI in the last 168 hours.  BNP (last 3 results) No results for input(s): BNP in the last 8760 hours.  ProBNP (last 3 results) No results for input(s): PROBNP in the last 8760 hours.  CBG: No results for input(s): GLUCAP in the last 168 hours.  Radiological Exams on Admission: CT HEAD WO CONTRAST (5MM)  Result Date: 07/01/2021 CLINICAL DATA:  Head trauma, minor (Age >=  65y) Generalized weakness. Fall since being evaluated earlier today for a prior fall. EXAM: CT HEAD WITHOUT CONTRAST TECHNIQUE: Contiguous axial images were obtained from the base of the skull through the vertex without intravenous contrast. COMPARISON:   Head CT earlier today. FINDINGS: Brain: Stable atrophy and chronic small vessel ischemia. No intracranial hemorrhage, mass effect, or midline shift. No hydrocephalus. The basilar cisterns are patent. No evidence of territorial infarct or acute ischemia. No extra-axial or intracranial fluid collection. Vascular: Atherosclerosis of skullbase vasculature without hyperdense vessel or abnormal calcification. Skull: No fracture or focal lesion. Sinuses/Orbits: Paranasal sinuses and mastoid air cells are clear. Remote medial right orbital wall fracture again seen. Bilateral cataract resection. Other: Left occipital scalp contusion slightly improved from earlier today. IMPRESSION: 1. Small left occipital scalp hematoma. No acute intracranial abnormality. No skull fracture. 2. Stable atrophy and chronic small vessel ischemia. Electronically Signed   By: Keith Rake M.D.   On: 07/01/2021 21:20   CT Head Wo Contrast  Result Date: 07/01/2021 CLINICAL DATA:  Head trauma, mod-severe; Neck trauma (Age >= 65y). Tripped over mat and fell today hitting the back of the head. Bilateral shoulder and neck pain. EXAM: CT HEAD WITHOUT CONTRAST CT CERVICAL SPINE WITHOUT CONTRAST TECHNIQUE: Multidetector CT imaging of the head and cervical spine was performed following the standard protocol without intravenous contrast. Multiplanar CT image reconstructions of the cervical spine were also generated. COMPARISON:  Head CT 12/28/2017. Head MRI 12/29/2017. Chest CT 04/25/2015. FINDINGS: CT HEAD FINDINGS Brain: There is no evidence of an acute infarct, intracranial hemorrhage, mass, midline shift, or extra-axial fluid collection. Patchy hypodensities in the cerebral white matter bilaterally may have mildly progressed from the prior CT and are nonspecific but compatible with moderate chronic small vessel ischemic disease. There is mild cerebral atrophy. Vascular: Calcified atherosclerosis at the skull base. No hyperdense vessel. Skull: No  acute fracture or suspicious osseous lesion. Sinuses/Orbits: Visualized paranasal sinuses are clear. Small chronic left mastoid effusion. Chronic medial right orbital fracture. Bilateral cataract extraction. Other: Left occipital scalp contusion. CT CERVICAL SPINE FINDINGS Alignment: Cervical spine straightening.  No listhesis. Skull base and vertebrae: Widespread interbody ankylosis is present in the cervical and included upper thoracic spine. A fracture through an anterior vertebral osteophyte at C6-7 is new from 2016 but favored to be nonacute. No posterior element fracture or suspicious osseous lesion is identified. Soft tissues and spinal canal: No prevertebral fluid or swelling. No visible canal hematoma. Disc levels: Widespread ankylosis. No evidence of high-grade spinal canal stenosis. Multilevel neural foraminal stenosis due to facet and uncovertebral hypertrophy including severe left neural foraminal stenosis at C3-4 and C4-5. Upper chest: Clear lung apices. Other: Mild carotid atherosclerosis. Small thyroid nodules measuring up to approximately 1 cm for which no imaging follow-up is recommended. IMPRESSION: 1. No evidence of acute intracranial abnormality. 2. Moderate chronic small vessel ischemic disease. 3. Left occipital scalp contusion. 4. 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 If there is clinical concern for an acute fracture or acute soft tissue injury in the cervical spine, consider MRI for further evaluation. Electronically Signed   By: Logan Bores M.D.   On: 07/01/2021 14:40   CT Cervical Spine Wo Contrast  Result Date: 07/01/2021 CLINICAL DATA:  Neck trauma (Age >= 65y) Generalized weakness, patient can walk since fall. Fall occurred after being evaluated for a prior fall earlier today. EXAM: CT CERVICAL SPINE WITHOUT CONTRAST  TECHNIQUE: Multidetector CT imaging of the cervical  spine was performed without intravenous contrast. Multiplanar CT image reconstructions were also generated. COMPARISON:  Cervical spine CT earlier today. FINDINGS: Alignment: Straightening of normal lordosis. No traumatic subluxation. Skull base and vertebrae: No acute fracture. Flowing anterior osteophytes in bony ankylosis. Fracture through an anterior vertebral osteophyte at C6-C7 is unchanged from earlier today and felt to be chronic. Dens and skull base are intact. Soft tissues and spinal canal: No prevertebral fluid or swelling. No visible canal hematoma. Disc levels: Diffuse thoracic ankylosis. Stable facet hypertrophy. No change from earlier today. Upper chest: Nonacute. Other: None. IMPRESSION: 1. No acute fracture or subluxation of the cervical spine. 2. Stable exam from CT earlier today. Again seen diffuse cervical ankylosis with fracture through the anterior vertebral osteophyte at C6-C7. This is favored to be chronic, but if there is clinical concern for acute fracture, MRI is again recommended. Electronically Signed   By: Keith Rake M.D.   On: 07/01/2021 21:17   CT Cervical Spine Wo Contrast  Result Date: 07/01/2021 CLINICAL DATA:  Head trauma, mod-severe; Neck trauma (Age >= 65y). Tripped over mat and fell today hitting the back of the head. Bilateral shoulder and neck pain. EXAM: CT HEAD WITHOUT CONTRAST CT CERVICAL SPINE WITHOUT CONTRAST TECHNIQUE: Multidetector CT imaging of the head and cervical spine was performed following the standard protocol without intravenous contrast. Multiplanar CT image reconstructions of the cervical spine were also generated. COMPARISON:  Head CT 12/28/2017. Head MRI 12/29/2017. Chest CT 04/25/2015. FINDINGS: CT HEAD FINDINGS Brain: There is no evidence of an acute infarct, intracranial hemorrhage, mass, midline shift, or extra-axial fluid collection. Patchy hypodensities in the cerebral white matter bilaterally may have mildly progressed from the prior CT and  are nonspecific but compatible with moderate chronic small vessel ischemic disease. There is mild cerebral atrophy. Vascular: Calcified atherosclerosis at the skull base. No hyperdense vessel. Skull: No acute fracture or suspicious osseous lesion. Sinuses/Orbits: Visualized paranasal sinuses are clear. Small chronic left mastoid effusion. Chronic medial right orbital fracture. Bilateral cataract extraction. Other: Left occipital scalp contusion. CT CERVICAL SPINE FINDINGS Alignment: Cervical spine straightening.  No listhesis. Skull base and vertebrae: Widespread interbody ankylosis is present in the cervical and included upper thoracic spine. A fracture through an anterior vertebral osteophyte at C6-7 is new from 2016 but favored to be nonacute. No posterior element fracture or suspicious osseous lesion is identified. Soft tissues and spinal canal: No prevertebral fluid or swelling. No visible canal hematoma. Disc levels: Widespread ankylosis. No evidence of high-grade spinal canal stenosis. Multilevel neural foraminal stenosis due to facet and uncovertebral hypertrophy including severe left neural foraminal stenosis at C3-4 and C4-5. Upper chest: Clear lung apices. Other: Mild carotid atherosclerosis. Small thyroid nodules measuring up to approximately 1 cm for which no imaging follow-up is recommended. IMPRESSION: 1. No evidence of acute intracranial abnormality. 2. Moderate chronic small vessel ischemic disease. 3. Left occipital scalp contusion. 4. 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 If there is clinical concern for an acute fracture or acute soft tissue injury in the cervical spine, consider MRI for further evaluation. Electronically Signed   By: Logan Bores M.D.   On: 07/01/2021 14:40   MR BRAIN WO CONTRAST  Result Date: 07/02/2021 CLINICAL DATA:  Generalized weakness and fall EXAM: MRI HEAD  WITHOUT CONTRAST TECHNIQUE: Multiplanar, multiecho pulse sequences of the brain and surrounding structures were  obtained without intravenous contrast. COMPARISON:  12/29/2017 FINDINGS: Brain: No acute infarct, mass effect or extra-axial collection. No acute or chronic hemorrhage. Hyperintense T2-weighted signal is moderately widespread throughout the white matter. Generalized volume loss without a clear lobar predilection. The midline structures are normal. Vascular: Major flow voids are preserved. Skull and upper cervical spine: Normal calvarium and skull base. Visualized upper cervical spine and soft tissues are normal. Sinuses/Orbits:No paranasal sinus fluid levels or advanced mucosal thickening. No mastoid or middle ear effusion. Normal orbits. IMPRESSION: 1. No acute intracranial abnormality. 2. Moderate chronic microvascular ischemic disease and generalized volume loss. Electronically Signed   By: Ulyses Jarred M.D.   On: 07/02/2021 00:19   MR Cervical Spine Wo Contrast  Result Date: 07/02/2021 CLINICAL DATA:  Fall EXAM: MRI CERVICAL SPINE WITHOUT CONTRAST TECHNIQUE: Multiplanar, multisequence MR imaging of the cervical spine was performed. No intravenous contrast was administered. COMPARISON:  None. FINDINGS: Alignment: Physiologic. Vertebrae: There is diffuse cervical ankylosis with a fractured anterior vertebral osteophyte at C6-7. There is mild C6-7 endplate edema and edema and disc space. There is a focal tear of ligamentum flavum at the C6-7 level. Cord: There is dorsal epidural hematoma extending from C5-T1 measuring 8 mm in AP thickness. This severely attenuates the thecal sac and causes mild compression of the spinal cord. Posterior Fossa, vertebral arteries, paraspinal tissues: Negative. Disc levels: Aside from the above described C6-7 disc space abnormalities, there is only mild degenerative disc disease. IMPRESSION: 1. Dorsal epidural hematoma extending from C5-T1 measuring 8 mm in AP thickness,  severely narrowing the thecal sac and mildly compressing the spinal cord. 2. Focal tear of the ligamentum flavum at C6-7. Fracture at C6-7 is likely acute or early subacute. 3. 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 Critical Value/emergent results were called by telephone at the time of interpretation on 07/02/2021 at 12:37 am to Dr. Betsey Holiday, who verbally acknowledged these results. Electronically Signed   By: Ulyses Jarred M.D.   On: 07/02/2021 00:38    EKG: I independently viewed the EKG done and my findings are as followed: Sinus rhythm rate of 74, nonspecific ST-T changes.  QTc 480.  Assessment/Plan Present on Admission:  Epidural hematoma ([name]  Active Problems:   Epidural hematoma ([name]  Epidural hematoma post fall at home, seen on cervical spine. Hard collar in place Transferred ED to ED to [facility] from [facility] for MRI Analgesics as needed EDP discussed case with neurosurgery who would like to monitor for now  Non-anion gap metabolic acidosis Serum bicarb 19 and anion gap of 10 Continue gentle IV fluid Repeat BMP  Type 2 diabetes with hyperglycemia Obtain hemoglobin A1c SSI scale  Chronic diastolic CHF Last 2D echo done on 12/29/2017 revealed LVEF 65% and grade 1 diastolic dysfunction Euvolemic on exam Strict I's and O's and daily weight  BPH Resume Monitor urine output  Generalized weakness/post fall PT OT eval Fall precautions   DVT prophylaxis: SCDs  Code Status: Full code  Family Communication: None at bedside  Disposition Plan: Admitted to progressive unit  Consults called: None  Admission status: Observation status   Status is: Observation    Dispo:  Patient From: Home  Planned Disposition: Home, possibly on 07/02/2021 or when neurosurgery signs off.  Medically stable for discharge: No      Kayleen Memos MD [facility] Pager [phone]  If 7PM-7AM, please contact  night-coverage [email or web address] Password [facility]  07/02/2021, 5:45 AM

## 2021-07-02 NOTE — ED Notes (Signed)
Attempted to give report 

## 2021-07-02 NOTE — Progress Notes (Signed)
Rehab Admissions Coordinator Note:  Patient was screened by Cleatrice Burke for appropriateness for an Inpatient Acute Rehab Consult per therapy recs.  At this time, we are recommending Inpatient Rehab consult. I will  place order per protocol.  Cleatrice Burke RN MSN 07/02/2021, 2:11 PM  I can be reached at [phone].

## 2021-07-02 NOTE — ED Provider Notes (Addendum)
Transferred here from [facility] for MRI brain and cervical spine after 2 separate falls today.  Has had CT head/neck with fracture through osteophyte felt to be chronic.  After second fall found to have new numbness to hands and weakness in BUE.  Results for orders placed or performed during the hospital encounter of 07/01/21  Resp Panel by RT-PCR (Flu A&B, Covid) Nasopharyngeal Swab   Specimen: Nasopharyngeal Swab; Nasopharyngeal(NP) swabs in vial transport medium  Result Value Ref Range   SARS Coronavirus 2 by RT PCR NEGATIVE NEGATIVE   Influenza A by PCR NEGATIVE NEGATIVE   Influenza B by PCR NEGATIVE NEGATIVE  Comprehensive metabolic panel  Result Value Ref Range   Sodium 138 135 - 145 mmol/L   Potassium 4.7 3.5 - 5.1 mmol/L   Chloride 109 98 - 111 mmol/L   CO2 19 (L) 22 - 32 mmol/L   Glucose, Bld 182 (H) 70 - 99 mg/dL   BUN 21 8 - 23 mg/dL   Creatinine, Ser 1.09 0.61 - 1.24 mg/dL   Calcium 8.4 (L) 8.9 - 10.3 mg/dL   Total Protein 7.4 6.5 - 8.1 g/dL   Albumin 3.8 3.5 - 5.0 g/dL   AST 31 15 - 41 U/L   ALT 17 0 - 44 U/L   Alkaline Phosphatase 81 38 - 126 U/L   Total Bilirubin 0.9 0.3 - 1.2 mg/dL   GFR, Estimated >60 >60 mL/min   Anion gap 10 5 - 15  CBC with Differential  Result Value Ref Range   WBC 12.2 (H) 4.0 - 10.5 K/uL   RBC 3.77 (L) 4.22 - 5.81 MIL/uL   Hemoglobin 11.9 (L) 13.0 - 17.0 g/dL   HCT 37.6 (L) 39.0 - 52.0 %   MCV 99.7 80.0 - 100.0 fL   MCH 31.6 26.0 - 34.0 pg   MCHC 31.6 30.0 - 36.0 g/dL   RDW 13.3 11.5 - 15.5 %   Platelets 193 150 - 400 K/uL   nRBC 0.0 0.0 - 0.2 %   Neutrophils Relative % 88 %   Neutro Abs 10.7 (H) 1.7 - 7.7 K/uL   Lymphocytes Relative 4 %   Lymphs Abs 0.5 (L) 0.7 - 4.0 K/uL   Monocytes Relative 7 %   Monocytes Absolute 0.8 0.1 - 1.0 K/uL   Eosinophils Relative 0 %   Eosinophils Absolute 0.0 0.0 - 0.5 K/uL   Basophils Relative 0 %   Basophils Absolute 0.0 0.0 - 0.1 K/uL   Immature Granulocytes 1 %   Abs Immature Granulocytes 0.14 (H)  0.00 - 0.07 K/uL  TSH  Result Value Ref Range   TSH 1.246 0.350 - 4.500 uIU/mL  Protime-INR  Result Value Ref Range   Prothrombin Time 14.1 11.4 - 15.2 seconds   INR 1.1 0.8 - 1.2   CT HEAD WO CONTRAST (5MM)  Result Date: 07/01/2021 CLINICAL DATA:  Head trauma, minor (Age >= 65y) Generalized weakness. Fall since being evaluated earlier today for a prior fall. EXAM: CT HEAD WITHOUT CONTRAST TECHNIQUE: Contiguous axial images were obtained from the base of the skull through the vertex without intravenous contrast. COMPARISON:  Head CT earlier today. FINDINGS: Brain: Stable atrophy and chronic small vessel ischemia. No intracranial hemorrhage, mass effect, or midline shift. No hydrocephalus. The basilar cisterns are patent. No evidence of territorial infarct or acute ischemia. No extra-axial or intracranial fluid collection. Vascular: Atherosclerosis of skullbase vasculature without hyperdense vessel or abnormal calcification. Skull: No fracture or focal lesion. Sinuses/Orbits: Paranasal sinuses and mastoid  air cells are clear. Remote medial right orbital wall fracture again seen. Bilateral cataract resection. Other: Left occipital scalp contusion slightly improved from earlier today. IMPRESSION: 1. Small left occipital scalp hematoma. No acute intracranial abnormality. No skull fracture. 2. Stable atrophy and chronic small vessel ischemia. Electronically Signed   By: Keith Rake M.D.   On: 07/01/2021 21:20   CT Head Wo Contrast  Result Date: 07/01/2021 CLINICAL DATA:  Head trauma, mod-severe; Neck trauma (Age >= 65y). Tripped over mat and fell today hitting the back of the head. Bilateral shoulder and neck pain. EXAM: CT HEAD WITHOUT CONTRAST CT CERVICAL SPINE WITHOUT CONTRAST TECHNIQUE: Multidetector CT imaging of the head and cervical spine was performed following the standard protocol without intravenous contrast. Multiplanar CT image reconstructions of the cervical spine were also generated.  COMPARISON:  Head CT 12/28/2017. Head MRI 12/29/2017. Chest CT 04/25/2015. FINDINGS: CT HEAD FINDINGS Brain: There is no evidence of an acute infarct, intracranial hemorrhage, mass, midline shift, or extra-axial fluid collection. Patchy hypodensities in the cerebral white matter bilaterally may have mildly progressed from the prior CT and are nonspecific but compatible with moderate chronic small vessel ischemic disease. There is mild cerebral atrophy. Vascular: Calcified atherosclerosis at the skull base. No hyperdense vessel. Skull: No acute fracture or suspicious osseous lesion. Sinuses/Orbits: Visualized paranasal sinuses are clear. Small chronic left mastoid effusion. Chronic medial right orbital fracture. Bilateral cataract extraction. Other: Left occipital scalp contusion. CT CERVICAL SPINE FINDINGS Alignment: Cervical spine straightening.  No listhesis. Skull base and vertebrae: Widespread interbody ankylosis is present in the cervical and included upper thoracic spine. A fracture through an anterior vertebral osteophyte at C6-7 is new from 2016 but favored to be nonacute. No posterior element fracture or suspicious osseous lesion is identified. Soft tissues and spinal canal: No prevertebral fluid or swelling. No visible canal hematoma. Disc levels: Widespread ankylosis. No evidence of high-grade spinal canal stenosis. Multilevel neural foraminal stenosis due to facet and uncovertebral hypertrophy including severe left neural foraminal stenosis at C3-4 and C4-5. Upper chest: Clear lung apices. Other: Mild carotid atherosclerosis. Small thyroid nodules measuring up to approximately 1 cm for which no imaging follow-up is recommended. IMPRESSION: 1. No evidence of acute intracranial abnormality. 2. Moderate chronic small vessel ischemic disease. 3. Left occipital scalp contusion. 4. 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 If there is clinical concern for an acute fracture or acute soft tissue injury in the cervical spine, consider MRI for further evaluation. Electronically Signed   By: Logan Bores M.D.   On: 07/01/2021 14:40   CT Cervical Spine Wo Contrast  Result Date: 07/01/2021 CLINICAL DATA:  Neck trauma (Age >= 65y) Generalized weakness, patient can walk since fall. Fall occurred after being evaluated for a prior fall earlier today. EXAM: CT CERVICAL SPINE WITHOUT CONTRAST TECHNIQUE: Multidetector CT imaging of the cervical spine was performed without intravenous contrast. Multiplanar CT image reconstructions were also generated. COMPARISON:  Cervical spine CT earlier today. FINDINGS: Alignment: Straightening of normal lordosis. No traumatic subluxation. Skull base and vertebrae: No acute fracture. Flowing anterior osteophytes in bony ankylosis. Fracture through an anterior vertebral osteophyte at C6-C7 is unchanged from earlier today and felt to be chronic. Dens and skull base are intact. Soft tissues and spinal canal: No prevertebral fluid or swelling. No visible canal hematoma. Disc levels: Diffuse thoracic ankylosis. Stable facet hypertrophy. No change from earlier today. Upper chest:  Nonacute. Other: None. IMPRESSION: 1. No acute fracture or subluxation of the cervical spine. 2. Stable exam from CT earlier today. Again seen diffuse cervical ankylosis with fracture through the anterior vertebral osteophyte at C6-C7. This is favored to be chronic, but if there is clinical concern for acute fracture, MRI is again recommended. Electronically Signed   By: Keith Rake M.D.   On: 07/01/2021 21:17   CT Cervical Spine Wo Contrast  Result Date: 07/01/2021 CLINICAL DATA:  Head trauma, mod-severe; Neck trauma (Age >= 65y). Tripped over mat and fell today hitting the back of the head. Bilateral shoulder and neck pain. EXAM: CT HEAD WITHOUT CONTRAST CT CERVICAL SPINE WITHOUT CONTRAST TECHNIQUE:  Multidetector CT imaging of the head and cervical spine was performed following the standard protocol without intravenous contrast. Multiplanar CT image reconstructions of the cervical spine were also generated. COMPARISON:  Head CT 12/28/2017. Head MRI 12/29/2017. Chest CT 04/25/2015. FINDINGS: CT HEAD FINDINGS Brain: There is no evidence of an acute infarct, intracranial hemorrhage, mass, midline shift, or extra-axial fluid collection. Patchy hypodensities in the cerebral white matter bilaterally may have mildly progressed from the prior CT and are nonspecific but compatible with moderate chronic small vessel ischemic disease. There is mild cerebral atrophy. Vascular: Calcified atherosclerosis at the skull base. No hyperdense vessel. Skull: No acute fracture or suspicious osseous lesion. Sinuses/Orbits: Visualized paranasal sinuses are clear. Small chronic left mastoid effusion. Chronic medial right orbital fracture. Bilateral cataract extraction. Other: Left occipital scalp contusion. CT CERVICAL SPINE FINDINGS Alignment: Cervical spine straightening.  No listhesis. Skull base and vertebrae: Widespread interbody ankylosis is present in the cervical and included upper thoracic spine. A fracture through an anterior vertebral osteophyte at C6-7 is new from 2016 but favored to be nonacute. No posterior element fracture or suspicious osseous lesion is identified. Soft tissues and spinal canal: No prevertebral fluid or swelling. No visible canal hematoma. Disc levels: Widespread ankylosis. No evidence of high-grade spinal canal stenosis. Multilevel neural foraminal stenosis due to facet and uncovertebral hypertrophy including severe left neural foraminal stenosis at C3-4 and C4-5. Upper chest: Clear lung apices. Other: Mild carotid atherosclerosis. Small thyroid nodules measuring up to approximately 1 cm for which no imaging follow-up is recommended. IMPRESSION: 1. No evidence of acute intracranial abnormality. 2.  Moderate chronic small vessel ischemic disease. 3. Left occipital scalp contusion. 4. 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 If there is clinical concern for an acute fracture or acute soft tissue injury in the cervical spine, consider MRI for further evaluation. Electronically Signed   By: Logan Bores M.D.   On: 07/01/2021 14:40   MR BRAIN WO CONTRAST  Result Date: 07/02/2021 CLINICAL DATA:  Generalized weakness and fall EXAM: MRI HEAD WITHOUT CONTRAST TECHNIQUE: Multiplanar, multiecho pulse sequences of the brain and surrounding structures were obtained without intravenous contrast. COMPARISON:  12/29/2017 FINDINGS: Brain: No acute infarct, mass effect or extra-axial collection. No acute or chronic hemorrhage. Hyperintense T2-weighted signal is moderately widespread throughout the white matter. Generalized volume loss without a clear lobar predilection. The midline structures are normal. Vascular: Major flow voids are preserved. Skull and upper cervical spine: Normal calvarium and skull base. Visualized upper cervical spine and soft tissues are normal. Sinuses/Orbits:No paranasal sinus fluid levels or advanced mucosal thickening. No mastoid or middle ear effusion. Normal orbits. IMPRESSION: 1. No acute intracranial abnormality. 2. Moderate chronic microvascular ischemic disease and generalized volume loss.  Electronically Signed   By: Ulyses Jarred M.D.   On: 07/02/2021 00:19   MR Cervical Spine Wo Contrast  Result Date: 07/02/2021 CLINICAL DATA:  Fall EXAM: MRI CERVICAL SPINE WITHOUT CONTRAST TECHNIQUE: Multiplanar, multisequence MR imaging of the cervical spine was performed. No intravenous contrast was administered. COMPARISON:  None. FINDINGS: Alignment: Physiologic. Vertebrae: There is diffuse cervical ankylosis with a fractured anterior vertebral osteophyte at C6-7. There is mild C6-7  endplate edema and edema and disc space. There is a focal tear of ligamentum flavum at the C6-7 level. Cord: There is dorsal epidural hematoma extending from C5-T1 measuring 8 mm in AP thickness. This severely attenuates the thecal sac and causes mild compression of the spinal cord. Posterior Fossa, vertebral arteries, paraspinal tissues: Negative. Disc levels: Aside from the above described C6-7 disc space abnormalities, there is only mild degenerative disc disease. IMPRESSION: 1. Dorsal epidural hematoma extending from C5-T1 measuring 8 mm in AP thickness, severely narrowing the thecal sac and mildly compressing the spinal cord. 2. Focal tear of the ligamentum flavum at C6-7. Fracture at C6-7 is likely acute or early subacute. 3. 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 Critical Value/emergent results were called by telephone at the time of interpretation on 07/02/2021 at 12:37 am to Dr. Betsey Holiday, who verbally acknowledged these results. Electronically Signed   By: Ulyses Jarred M.D.   On: 07/02/2021 00:38    12:48 AM MRI with epidural hematoma C5-T1 up to 73m thickness that is severely narrowing thecal sac and compressing spinal cord, focal tear at ligamentum flavum at C6-C7 and acute fractures of anterior vertebral osteophyte at C6-C7, assocaited edema at the endplate and in disc space.  On reassessment, he is moving his arms well, able to lift above head, grip my fingers with both hands but states it feels weaker than his normal.  He can sense light and deep touch to BUE, but states it feels like his arms are "asleep".  Pulses are intact BUE, hands are warm and well perfused.  He is not currently on anticoagulation. Will transition to ASPEN collar, discuss with neurosurgery.    1:08 AM Discussed with Dr. CChristella Noa- agrees with ASPEN collar for now.  No emergent surgical intervention.  Does not feel like decadron would necessarily be beneficial in this instance.   Reasonable to have medicine admit for PT/OT eval if needed.  As patient has already fallen today resulting in these fairly significant injuries, and reports ongoing issues with left knee and right ankle, I do feel he would benefit from observation and PT/OT evaluation.  Discussed with Dr. HNevada Crane- will admit for observation.   SLarene Pickett PA-C 07/02/21 0224    SLarene Pickett PA-C 07/02/21 [id number]   POrpah Greek MD 07/02/21 [phone]

## 2021-07-02 NOTE — Progress Notes (Signed)
Transition of Care [facility]) - CAGE-AID Screening   Patient Details  Name: Jared Bridges MRN: [id number] Date of Birth: 01/31/1930   Clinical Narrative:  Pt denies drug or alcohol use  CAGE-AID Screening:    Have You Ever Felt You Ought to Cut Down on Your Drinking or Drug Use?: No Have People Annoyed You By [employer] Your Drinking Or Drug Use?: No Have You Felt Bad Or Guilty About Your Drinking Or Drug Use?: No Have You Ever Had a Drink or Used Drugs First Thing In The Morning to Steady Your Nerves or to Get Rid of a Hangover?: No CAGE-AID Score: 0  Substance Abuse Education Offered: No

## 2021-07-03 LAB — GLUCOSE, CAPILLARY
Glucose-Capillary: 164 mg/dL — ABNORMAL HIGH (ref 70–99)
Glucose-Capillary: 178 mg/dL — ABNORMAL HIGH (ref 70–99)
Glucose-Capillary: 179 mg/dL — ABNORMAL HIGH (ref 70–99)
Glucose-Capillary: 199 mg/dL — ABNORMAL HIGH (ref 70–99)
Glucose-Capillary: 174 mg/dL — ABNORMAL HIGH (ref 70–99)

## 2021-07-03 NOTE — Progress Notes (Signed)
PROGRESS NOTE    Jared Bridges  [id number] DOB: 08-09-1930 DOA: 07/01/2021 PCP: Doree Albee, MD  Brief Narrative: 87 year old male with history of prostate cancer, bony mets, type 2 diabetes mellitus, hypertension, dyslipidemia, hearing impairment presented to [name], ED after mechanical fall, slipping on soap, was deemed stable discharged home return to ED the same day due to upper extremity numbness and some pain, repeat CT C-spine was abnormal subsequently transferred to [facility] for MRI. Neurosurgery recommendations  Assessment & Plan:   C 6/C7 fracture/mechanical fall Small epidural hematoma -EDP discussed with neurosurgery Dr. Christella Noa, recommended nonoperative management, Aspen collar, PT OT, conservative management -Physical therapy evaluation completed, CIR consulted -Discussed CIR with patient today, he would like to discuss this further with his daughter -Follow-up with neurosurgery in few weeks  Type 2 diabetes mellitus -CBGs are stable, sliding scale insulin  Chronic diastolic CHF -Clinically euvolemic  Metastatic prostate cancer -Resume Xtandi  BPH -Continue Proscar  Generalized weakness/fall -Physical therapy  Home meds -Resumed Lexapro, Protonix, Lyrica, Topamax, Effexor  DVT prophylaxis: SCDs Code Status: DNR Family Communication: Discussed with patient in detail, no family at bedside, will attempt to contact daughter later today Disposition Plan:  Status is: Inpatient  Remains inpatient appropriate because:Inpatient level of care appropriate due to severity of illness  Dispo:  Patient From: Home  Planned Disposition: Possibly CIR  medically stable for discharge: No     Consultants:  Discussed with neurosurgery Dr. Christella Noa on admission  Procedures:   Antimicrobials:    Subjective: -Mild neck discomfort but bearable, had some headache yesterday  Objective: Vitals:   07/03/21 0400 07/03/21 0406 07/03/21 0755 07/03/21 1100   BP: 124/66 136/61 132/71 (!) 146/66  Pulse: 89 86 92 90  Resp: (!) '22 17 17 14  '$ Temp:  98.7 F (37.1 C) 98.9 F (37.2 C) 98.7 F (37.1 C)  TempSrc:  Oral Oral Oral  SpO2: 95% 94% 90% 96%  Weight:      Height:        Intake/Output Summary (Last 24 hours) at 07/03/2021 1521 Last data filed at 07/03/2021 1319 Gross per 24 hour  Intake 600 ml  Output --  Net 600 ml   Filed Weights   07/01/21 1952  Weight: 95 kg    Examination:  General exam: Pleasant elderly male, laying in bed, AAOx3, no distress HEENT: Cervical collar on CVS: S1-S2, regular rate rhythm Lungs: Clear bilaterally Abdomen: Soft, nontender, bowel sounds present Extremities: No edema  Neuro: Moves all extremities, no localizing signs Skin: As above Psychiatry:  Mood & affect appropriate.     Data Reviewed:   CBC: Recent Labs  Lab 07/01/21 2138 07/02/21 0652  WBC 12.2* 10.3  NEUTROABS 10.7*  --   HGB 11.9* 11.5*  HCT 37.6* 34.8*  MCV 99.7 96.1  PLT 193 [id number]   Basic Metabolic Panel: Recent Labs  Lab 07/01/21 2138 07/02/21 0652  NA 138 138  K 4.7 4.2  CL 109 107  CO2 19* 23  GLUCOSE 182* 156*  BUN 21 20  CREATININE 1.09 1.10  CALCIUM 8.4* 8.5*   GFR: Estimated Creatinine Clearance: 52.9 mL/min (by C-G formula based on SCr of 1.1 mg/dL). Liver Function Tests: Recent Labs  Lab 07/01/21 2138  AST 31  ALT 17  ALKPHOS 81  BILITOT 0.9  PROT 7.4  ALBUMIN 3.8   No results for input(s): LIPASE, AMYLASE in the last 168 hours. No results for input(s): AMMONIA in the last 168 hours. Coagulation Profile:  Recent Labs  Lab 07/01/21 2138  INR 1.1   Cardiac Enzymes: No results for input(s): CKTOTAL, CKMB, CKMBINDEX, TROPONINI in the last 168 hours. BNP (last 3 results) No results for input(s): PROBNP in the last 8760 hours. HbA1C: No results for input(s): HGBA1C in the last 72 hours. CBG: Recent Labs  Lab 07/02/21 1647 07/02/21 2344 07/03/21 0403 07/03/21 0753 07/03/21 1101   GLUCAP 184* 179* 164* 178* 179*   Lipid Profile: No results for input(s): CHOL, HDL, LDLCALC, TRIG, CHOLHDL, LDLDIRECT in the last 72 hours. Thyroid Function Tests: Recent Labs    07/01/21 2138  TSH 1.246   Anemia Panel: No results for input(s): VITAMINB12, FOLATE, FERRITIN, TIBC, IRON, RETICCTPCT in the last 72 hours. Urine analysis:    Component Value Date/Time   COLORURINE YELLOW 09/04/2020 1136   APPEARANCEUR HAZY (A) 09/04/2020 1136   LABSPEC 1.016 09/04/2020 1136   PHURINE 7.0 09/04/2020 1136   GLUCOSEU NEGATIVE 09/04/2020 1136   HGBUR NEGATIVE 09/04/2020 [place] 09/04/2020 1136   BILIRUBINUR negative 04/19/2019 [place] 09/04/2020 1136   PROTEINUR NEGATIVE 09/04/2020 1136   UROBILINOGEN 0.2 04/19/2019 1354   UROBILINOGEN 1.0 05/21/2015 1050   NITRITE NEGATIVE 09/04/2020 1136   LEUKOCYTESUR NEGATIVE 09/04/2020 1136   Sepsis Labs: '@LABRCNTIP'$ (procalcitonin:4,lacticidven:4)  ) Recent Results (from the past 240 hour(s))  Resp Panel by RT-PCR (Flu A&B, Covid) Nasopharyngeal Swab     Status: None   Collection Time: 07/01/21  8:51 PM   Specimen: Nasopharyngeal Swab; Nasopharyngeal(NP) swabs in vial transport medium  Result Value Ref Range Status   SARS Coronavirus 2 by RT PCR NEGATIVE NEGATIVE Final    Comment: (NOTE) SARS-CoV-2 target nucleic acids are NOT DETECTED.  The SARS-CoV-2 RNA is generally detectable in upper respiratory specimens during the acute phase of infection. The lowest concentration of SARS-CoV-2 viral copies this assay can detect is 138 copies/mL. A negative result does not preclude SARS-Cov-2 infection and should not be used as the sole basis for treatment or other patient management decisions. A negative result may occur with  improper specimen collection/handling, submission of specimen other than nasopharyngeal swab, presence of viral mutation(s) within the areas targeted by this assay, and inadequate  number of viral copies(<138 copies/mL). A negative result must be combined with clinical observations, patient history, and epidemiological information. The expected result is Negative.  Fact Sheet for Patients:  EntrepreneurPulse.com.au  Fact Sheet for Healthcare Providers:  IncredibleEmployment.be  This test is no t yet approved or cleared by the [place] FDA and  has been authorized for detection and/or diagnosis of SARS-CoV-2 by FDA under an Emergency Use Authorization (EUA). This EUA will remain  in effect (meaning this test can be used) for the duration of the COVID-19 declaration under Section 564(b)(1) of the Act, 21 U.S.C.section 360bbb-3(b)(1), unless the authorization is terminated  or revoked sooner.       Influenza A by PCR NEGATIVE NEGATIVE Final   Influenza B by PCR NEGATIVE NEGATIVE Final    Comment: (NOTE) The Xpert Xpress SARS-CoV-2/FLU/RSV plus assay is intended as an aid in the diagnosis of influenza from Nasopharyngeal swab specimens and should not be used as a sole basis for treatment. Nasal washings and aspirates are unacceptable for Xpert Xpress SARS-CoV-2/FLU/RSV testing.  Fact Sheet for Patients: EntrepreneurPulse.com.au  Fact Sheet for Healthcare Providers: IncredibleEmployment.be  This test is not yet approved or cleared by the [place] FDA and has been authorized for detection and/or diagnosis of SARS-CoV-2 by  FDA under an Emergency Use Authorization (EUA). This EUA will remain in effect (meaning this test can be used) for the duration of the COVID-19 declaration under Section 564(b)(1) of the Act, 21 U.S.C. section 360bbb-3(b)(1), unless the authorization is terminated or revoked.  Performed at [facility], [address] [place], [place] [address]   MRSA Next Gen by PCR, Nasal     Status: None   Collection Time: 07/02/21  2:18 PM   Specimen: Nasal Mucosa;  Nasal Swab  Result Value Ref Range Status   MRSA by PCR Next Gen NOT DETECTED NOT DETECTED Final    Comment: (NOTE) The GeneXpert MRSA Assay (FDA approved for NASAL specimens only), is one component of a comprehensive MRSA colonization surveillance program. It is not intended to diagnose MRSA infection nor to guide or monitor treatment for MRSA infections. Test performance is not FDA approved in patients less than 75 years old. Performed at [facility], [place] [address] [place], [place] [address]          Radiology Studies: CT HEAD WO CONTRAST (5MM)  Result Date: 07/01/2021 CLINICAL DATA:  Head trauma, minor (Age >= 65y) Generalized weakness. Fall since being evaluated earlier today for a prior fall. EXAM: CT HEAD WITHOUT CONTRAST TECHNIQUE: Contiguous axial images were obtained from the base of the skull through the vertex without intravenous contrast. COMPARISON:  Head CT earlier today. FINDINGS: Brain: Stable atrophy and chronic small vessel ischemia. No intracranial hemorrhage, mass effect, or midline shift. No hydrocephalus. The basilar cisterns are patent. No evidence of territorial infarct or acute ischemia. No extra-axial or intracranial fluid collection. Vascular: Atherosclerosis of skullbase vasculature without hyperdense vessel or abnormal calcification. Skull: No fracture or focal lesion. Sinuses/Orbits: Paranasal sinuses and mastoid air cells are clear. Remote medial right orbital wall fracture again seen. Bilateral cataract resection. Other: Left occipital scalp contusion slightly improved from earlier today. IMPRESSION: 1. Small left occipital scalp hematoma. No acute intracranial abnormality. No skull fracture. 2. Stable atrophy and chronic small vessel ischemia. Electronically Signed   By: Keith Rake M.D.   On: 07/01/2021 21:20   CT Cervical Spine Wo Contrast  Result Date: 07/01/2021 CLINICAL DATA:  Neck trauma (Age >= 65y) Generalized weakness, patient can walk  since fall. Fall occurred after being evaluated for a prior fall earlier today. EXAM: CT CERVICAL SPINE WITHOUT CONTRAST TECHNIQUE: Multidetector CT imaging of the cervical spine was performed without intravenous contrast. Multiplanar CT image reconstructions were also generated. COMPARISON:  Cervical spine CT earlier today. FINDINGS: Alignment: Straightening of normal lordosis. No traumatic subluxation. Skull base and vertebrae: No acute fracture. Flowing anterior osteophytes in bony ankylosis. Fracture through an anterior vertebral osteophyte at C6-C7 is unchanged from earlier today and felt to be chronic. Dens and skull base are intact. Soft tissues and spinal canal: No prevertebral fluid or swelling. No visible canal hematoma. Disc levels: Diffuse thoracic ankylosis. Stable facet hypertrophy. No change from earlier today. Upper chest: Nonacute. Other: None. IMPRESSION: 1. No acute fracture or subluxation of the cervical spine. 2. Stable exam from CT earlier today. Again seen diffuse cervical ankylosis with fracture through the anterior vertebral osteophyte at C6-C7. This is favored to be chronic, but if there is clinical concern for acute fracture, MRI is again recommended. Electronically Signed   By: Keith Rake M.D.   On: 07/01/2021 21:17   MR BRAIN WO CONTRAST  Result Date: 07/02/2021 CLINICAL DATA:  Generalized weakness and fall EXAM: MRI HEAD WITHOUT CONTRAST TECHNIQUE: Multiplanar,  multiecho pulse sequences of the brain and surrounding structures were obtained without intravenous contrast. COMPARISON:  12/29/2017 FINDINGS: Brain: No acute infarct, mass effect or extra-axial collection. No acute or chronic hemorrhage. Hyperintense T2-weighted signal is moderately widespread throughout the white matter. Generalized volume loss without a clear lobar predilection. The midline structures are normal. Vascular: Major flow voids are preserved. Skull and upper cervical spine: Normal calvarium and skull  base. Visualized upper cervical spine and soft tissues are normal. Sinuses/Orbits:No paranasal sinus fluid levels or advanced mucosal thickening. No mastoid or middle ear effusion. Normal orbits. IMPRESSION: 1. No acute intracranial abnormality. 2. Moderate chronic microvascular ischemic disease and generalized volume loss. Electronically Signed   By: Ulyses Jarred M.D.   On: 07/02/2021 00:19   MR Cervical Spine Wo Contrast  Result Date: 07/02/2021 CLINICAL DATA:  Fall EXAM: MRI CERVICAL SPINE WITHOUT CONTRAST TECHNIQUE: Multiplanar, multisequence MR imaging of the cervical spine was performed. No intravenous contrast was administered. COMPARISON:  None. FINDINGS: Alignment: Physiologic. Vertebrae: There is diffuse cervical ankylosis with a fractured anterior vertebral osteophyte at C6-7. There is mild C6-7 endplate edema and edema and disc space. There is a focal tear of ligamentum flavum at the C6-7 level. Cord: There is dorsal epidural hematoma extending from C5-T1 measuring 8 mm in AP thickness. This severely attenuates the thecal sac and causes mild compression of the spinal cord. Posterior Fossa, vertebral arteries, paraspinal tissues: Negative. Disc levels: Aside from the above described C6-7 disc space abnormalities, there is only mild degenerative disc disease. IMPRESSION: 1. Dorsal epidural hematoma extending from C5-T1 measuring 8 mm in AP thickness, severely narrowing the thecal sac and mildly compressing the spinal cord. 2. Focal tear of the ligamentum flavum at C6-7. Fracture at C6-7 is likely acute or early subacute. 3. 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 Critical Value/emergent results were called by telephone at the time of interpretation on 07/02/2021 at 12:37 am to Dr. Betsey Holiday, who verbally acknowledged these results. Electronically Signed   By: Ulyses Jarred M.D.   On: 07/02/2021 00:38     Scheduled Meds:  acetaminophen  650 mg Oral TID    alfuzosin  10 mg Oral QPM   atorvastatin  40 mg Oral Daily   cholecalciferol  5,000 Units Oral QHS   enzalutamide  80 mg Oral QPM   finasteride  5 mg Oral Daily   insulin aspart  0-9 Units Subcutaneous TID WC   pantoprazole  20 mg Oral Daily   pregabalin  75 mg Oral BID   topiramate  50 mg Oral BID   venlafaxine XR  37.5 mg Oral q morning   vitamin B-12  1,000 mcg Oral Daily   Continuous Infusions:   LOS: 1 day   Time spent: 73mn  PDomenic Polite MD [facility]   07/03/2021, 3:21 PM

## 2021-07-03 NOTE — Progress Notes (Signed)
Inpatient Rehabilitation Admissions Coordinator   Inpatient rehab consult received. I met with patient at bedside and spoke with his daughter, [name], by phone. We discussed goals and expectations of a possible CIR admit. [name] prefers CIR at [facility] rather than [facility] at this time. Will follow up at [facility] as as outpatient. I will begin Auth with [facility] Medicare for a possible CIR admit pending their approval as [facility] Medicare.  Danne Baxter, RN, MSN Rehab Admissions Coordinator [phone] 07/03/2021 2:28 PM

## 2021-07-04 LAB — GLUCOSE, CAPILLARY
Glucose-Capillary: 123 mg/dL — ABNORMAL HIGH (ref 70–99)
Glucose-Capillary: 161 mg/dL — ABNORMAL HIGH (ref 70–99)
Glucose-Capillary: 164 mg/dL — ABNORMAL HIGH (ref 70–99)
Glucose-Capillary: 168 mg/dL — ABNORMAL HIGH (ref 70–99)
Glucose-Capillary: 256 mg/dL — ABNORMAL HIGH (ref 70–99)

## 2021-07-04 MED ORDER — OXYCODONE HCL 5 MG PO TABS
5.0000 mg | ORAL_TABLET | Freq: Four times a day (QID) | ORAL | Status: DC | PRN
  Administered 2021-07-04: 5 mg via ORAL
  Filled 2021-07-04: qty 1

## 2021-07-04 MED ORDER — SENNOSIDES-DOCUSATE SODIUM 8.6-50 MG PO TABS
1.0000 | ORAL_TABLET | Freq: Two times a day (BID) | ORAL | Status: DC
  Administered 2021-07-04 – 2021-07-05 (×3): 1 via ORAL
  Filled 2021-07-04 (×3): qty 1

## 2021-07-04 NOTE — Progress Notes (Signed)
Occupational Therapy Treatment Patient Details Name: Jared Bridges MRN: [id number] DOB: Dec 08, 1929 Today's Date: 07/04/2021    History of present illness 87 yo s/p fall at home on 8/1; brought to [facility] and discharged home. REturned same day of DC due to BUE numbness and generalized weakness in BLE. Transferred to [facility] ED. Cervical MRI+ dorsal epidural hematoma from C5-T1 with mild cord compression; Focal tear of ligamentum flavum at C6-7; Fx C6-7 is acute or early subacute; fx anterior vertebral osteophyte at C6-7.PMH: CKD,Depression, GI Bleed, PN, PUD, CVA(residual trouble reading/writing), DM, TIA, HTN, syncope.   OT comments  This 87 yo male making great progress with bed mobility, sitting balance, standing balance and ambulation today as well as able to self feed with regular spoon while seated in recliner where he could see food better due to cervical collar. Having coordination issues with left hand(but able to hold applesauce cup still will scooping out with RUE-will address in later session. He will continue to benefit from acute OT with follow up on CIR.  Follow Up Recommendations  CIR;Supervision/Assistance - 24 hour    Equipment Recommendations  3 in 1 bedside commode       Precautions / Restrictions Precautions Precautions: Fall;Cervical Required Braces or Orthoses: Cervical Brace Cervical Brace: Hard collar;At all times Restrictions Weight Bearing Restrictions: No       Mobility Bed Mobility Overal bed mobility: Needs Assistance Bed Mobility: Rolling;Sidelying to Sit Rolling: Min assist Sidelying to sit: Mod assist            Transfers Overall transfer level: Needs assistance Equipment used: Rolling walker (2 wheeled) Transfers: Sit to/from Stand Sit to Stand: Min assist;+2 physical assistance         General transfer comment: from raised bed and recliner; VCs for safe hand placement    Balance Overall balance assessment: Needs assistance Sitting-balance  support: Feet supported;Bilateral upper extremity supported Sitting balance-Leahy Scale: Poor     Standing balance support: Bilateral upper extremity supported Standing balance-Leahy Scale: Poor Standing balance comment: Reliant on external support and RW                           ADL either performed or assessed with clinical judgement   ADL Overall ADL's : Needs assistance/impaired Eating/Feeding: Set up;Sitting Eating/Feeding Details (indicate cue type and reason): in recliner, using regular spoon for eating applesauce     Upper Body Bathing: Moderate assistance               Toilet Transfer: Minimal assistance;+2 for safety/equipment;RW;Ambulation Toilet Transfer Details (indicate cue type and reason): simulated bed>10 feet> sit in recliner>10 feet>sit in recliner                 Vision Patient Visual Report: No change from baseline            Cognition Arousal/Alertness: Awake/alert Behavior During Therapy: WFL for tasks assessed/performed Overall Cognitive Status: Impaired/Different from baseline Area of Impairment: Following commands;Safety/judgement;Problem solving                       Following Commands: Follows one step commands inconsistently;Follows one step commands with increased time Safety/Judgement: Decreased awareness of safety   Problem Solving: Difficulty sequencing;Requires verbal cues;Requires tactile cues                     Pertinent Vitals/ Pain       Pain Assessment: 0-10 Pain  Score: 10-Worst pain ever (started out at 6) Pain Location: back of neck Pain Descriptors / Indicators: Grimacing;Aching;Sore Pain Intervention(s): Limited activity within patient's tolerance;Monitored during session;Repositioned;Patient requesting pain meds-RN notified (re-adjusted c-collar for more support)         Frequency  Min 2X/week        Progress Toward Goals  OT Goals(current goals can now be found in the care  plan section)  Progress towards OT goals: Progressing toward goals  Acute Rehab OT Goals Patient Stated Goal: to get stronger and go to rehab OT Goal Formulation: With patient Time For Goal Achievement: 07/16/21 Potential to Achieve Goals: Good  Plan Discharge plan remains appropriate    Co-evaluation    PT/OT/SLP Co-Evaluation/Treatment: Yes Reason for Co-Treatment: For patient/therapist safety;To address functional/ADL transfers PT goals addressed during session: Mobility/safety with mobility;Balance;Proper use of DME;Strengthening/ROM OT goals addressed during session: Strengthening/ROM;ADL's and self-care      AM-PAC OT "6 Clicks" Daily Activity     Outcome Measure   Help from another person eating meals?: A Little (setup) Help from another person taking care of personal grooming?: A Little Help from another person toileting, which includes using toliet, bedpan, or urinal?: A Lot Help from another person bathing (including washing, rinsing, drying)?: A Lot Help from another person to put on and taking off regular upper body clothing?: A Lot Help from another person to put on and taking off regular lower body clothing?: A Lot 6 Click Score: 14    End of Session Equipment Utilized During Treatment: Gait belt;Rolling walker;Cervical collar  OT Visit Diagnosis: Unsteadiness on feet (R26.81);Other abnormalities of gait and mobility (R26.89);Repeated falls (R29.6);Muscle weakness (generalized) (M62.81);Pain Pain - part of body:  (back of neck)   Activity Tolerance Patient tolerated treatment well   Patient Left in chair;with call bell/phone within reach;with chair alarm set   Nurse Communication Mobility status (Also pt can feed himself better if he is up in recliner for meals due to cervical collar)        Time: [id number] OT Time Calculation (min): 34 min  Charges: OT General Charges $OT Visit: 1 Visit OT Treatments $Self Care/Home Management : 8-22 mins [place], OTR/L Acute [employer] Pager [phone] Office [phone]     Almon Register 07/04/2021, 11:57 AM

## 2021-07-04 NOTE — Progress Notes (Signed)
Physical Therapy Treatment Patient Details Name: Jared Bridges MRN: [id number] DOB: Nov 04, 1930 Today's Date: 07/04/2021    History of Present Illness 87 yo s/p fall at home on 8/1; brought to [facility] and discharged home. REturned same day of DC due to BUE numbness and generalized weakness in BLE. Transferred to [facility] ED. Cervical MRI+ dorsal epidural hematoma from C5-T1 with mild cord compression; Focal tear of ligamentum flavum at C6-7; Fx C6-7 is acute or early subacute; fx anterior vertebral osteophyte at C6-7.PMH: CKD,Depression, GI Bleed, PN, PUD, CVA(residual trouble reading/writing), DM, TIA, HTN, syncope.    PT Comments    The pt presents with continued reports of neck pain, but is making good progress with OOB mobility at this time. He was able to complete 2 short bouts of ambulation in the room, but did require minA of 2 and chair follow for safety. The pt continues to present with weakness in BLE resulting in decreased stability and increased reliance on UE support. The pt will continue to benefit from skilled PT acutely to progress dynamic stability, power, and endurance to allow for eventual return to independence.     Follow Up Recommendations  CIR;Supervision/Assistance - 24 hour     Equipment Recommendations  Other (comment) (defer to post acute)    Recommendations for Other Services       Precautions / Restrictions Precautions Precautions: Fall;Cervical Precaution Booklet Issued: No Required Braces or Orthoses: Cervical Brace Cervical Brace: Hard collar;At all times Restrictions Weight Bearing Restrictions: No    Mobility  Bed Mobility Overal bed mobility: Needs Assistance Bed Mobility: Rolling;Sidelying to Sit Rolling: Min assist Sidelying to sit: Mod assist       General bed mobility comments: Required assist for trunk and LEs. Cues for log roll technique    Transfers Overall transfer level: Needs assistance Equipment used: Rolling walker (2  wheeled) Transfers: Sit to/from Stand Sit to Stand: Min assist;+2 physical assistance         General transfer comment: from raised bed and recliner; VCs for safe hand placement  Ambulation/Gait Ambulation/Gait assistance: Min assist;+2 safety/equipment Gait Distance (Feet): 12 Feet (+ 10 ft) Assistive device: Rolling walker (2 wheeled) Gait Pattern/deviations: Step-to pattern Gait velocity: decreased   General Gait Details: pt with significant ER with RLE, slight hyperextension in LLE when cued to increase stride length.     Balance Overall balance assessment: Needs assistance Sitting-balance support: Feet supported;Bilateral upper extremity supported Sitting balance-Leahy Scale: Poor     Standing balance support: Bilateral upper extremity supported Standing balance-Leahy Scale: Poor Standing balance comment: Reliant on external support and RW                            Cognition Arousal/Alertness: Awake/alert Behavior During Therapy: WFL for tasks assessed/performed Overall Cognitive Status: Impaired/Different from baseline Area of Impairment: Following commands;Safety/judgement;Problem solving                       Following Commands: Follows one step commands inconsistently;Follows one step commands with increased time Safety/Judgement: Decreased awareness of safety   Problem Solving: Difficulty sequencing;Requires verbal cues;Requires tactile cues General Comments: Pt alert and oriented however was unaware of being at [facility]; knew he had been at [facility]      Exercises General Exercises - Lower Extremity Ankle Circles/Pumps: AROM;Both;10 reps;Seated Long Arc Quad: AROM;Both;5 reps;Seated    General Comments General comments (skin integrity, edema, etc.): pt c/o head feeling  heavy due to moderate kyphosis and forward head posture. attempted to adjust brace to be more supportive      Pertinent Vitals/Pain Pain Assessment: 0-10 Pain Score:  10-Worst pain ever (started out at 6) Faces Pain Scale: Hurts even more Pain Location: back of neck Pain Descriptors / Indicators: Grimacing;Aching;Sore Pain Intervention(s): Limited activity within patient's tolerance;Monitored during session;Repositioned;Patient requesting pain meds-RN notified    Home Living                      Prior Function            PT Goals (current goals can now be found in the care plan section) Acute Rehab PT Goals Patient Stated Goal: to get stronger and go to rehab PT Goal Formulation: With patient Time For Goal Achievement: 07/16/21 Potential to Achieve Goals: Good Progress towards PT goals: Progressing toward goals    Frequency    Min 3X/week      PT Plan Current plan remains appropriate    Co-evaluation PT/OT/SLP Co-Evaluation/Treatment: Yes Reason for Co-Treatment: For patient/therapist safety;To address functional/ADL transfers PT goals addressed during session: Mobility/safety with mobility;Balance OT goals addressed during session: Strengthening/ROM;ADL's and self-care      AM-PAC PT "6 Clicks" Mobility   Outcome Measure  Help needed turning from your back to your side while in a flat bed without using bedrails?: A Lot Help needed moving from lying on your back to sitting on the side of a flat bed without using bedrails?: A Lot Help needed moving to and from a bed to a chair (including a wheelchair)?: A Lot Help needed standing up from a chair using your arms (e.g., wheelchair or bedside chair)?: A Lot Help needed to walk in hospital room?: A Little Help needed climbing 3-5 steps with a railing? : Total 6 Click Score: 12    End of Session Equipment Utilized During Treatment: Gait belt;Cervical collar Activity Tolerance: Patient tolerated treatment well Patient left: in chair;with call bell/phone within reach Nurse Communication: Mobility status PT Visit Diagnosis: Unsteadiness on feet (R26.81);Muscle weakness  (generalized) (M62.81);History of falling (Z91.81);Repeated falls (R29.6)     Time: [id number] PT Time Calculation (min) (ACUTE ONLY): 34 min  Charges:  $Gait Training: 8-22 mins                     Inocencio Homes, PT, DPT   Acute Rehabilitation Department Pager #: [phone]   Otho Bellows 07/04/2021, 12:23 PM

## 2021-07-04 NOTE — Progress Notes (Signed)
PROGRESS NOTE    Jared Bridges  [id number] DOB: 08-03-1930 DOA: 07/01/2021 PCP: Doree Albee, MD  Brief Narrative: 87 year old male with history of prostate cancer, bony mets, type 2 diabetes mellitus, hypertension, dyslipidemia, hearing impairment presented to [name] ED after mechanical fall, slipping on soap, was deemed stable discharged home return to ED the same day due to upper extremity numbness and some pain, repeat CT C-spine was abnormal subsequently transferred to [facility] for MRI. Neurosurgery recommendations  Assessment & Plan:   C 6/C7 fracture/mechanical fall Small epidural hematoma -EDP discussed with neurosurgery Dr. Christella Noa, recommended nonoperative management, Aspen collar, PT OT, conservative management -Physical therapy evaluation completed, CIR consulted -Patient is agreeable to CIR, discharge planning -Follow-up with neurosurgery in few weeks  Type 2 diabetes mellitus -CBGs are stable, sliding scale insulin  Chronic diastolic CHF -Clinically euvolemic  Metastatic prostate cancer -Continue Xtandi  BPH -Continue Proscar  Generalized weakness/fall -Physical therapy  Home meds -Resumed Lexapro, Protonix, Lyrica, Topamax, Effexor  DVT prophylaxis: SCDs Code Status: DNR Family Communication: Discussed with patient in detail, no family at bedside Disposition Plan:  Status is: Inpatient  Remains inpatient appropriate because:Inpatient level of care appropriate due to severity of illness  Dispo:  Patient From: Home  Planned Disposition: Awaiting CIR medically stable for discharge: Yes     Consultants:  Discussed with neurosurgery Dr. Christella Noa on admission  Procedures:   Antimicrobials:    Subjective: -Continues to have intermittent neck pain but overall okay, eating without swallowing problems so far  Objective: Vitals:   07/04/21 0123 07/04/21 0323 07/04/21 0723 07/04/21 1123  BP: 127/67 120/70 (!) 143/70 131/73  Pulse: 82 81  82 91  Resp: 18 (!) '26 17 20  '$ Temp:  98.5 F (36.9 C) 98.2 F (36.8 C) 97.6 F (36.4 C)  TempSrc:  Oral Oral Oral  SpO2: 96% 96% 98% 98%  Weight:      Height:        Intake/Output Summary (Last 24 hours) at 07/04/2021 1210 Last data filed at 07/04/2021 [id number] Gross per 24 hour  Intake 437 ml  Output --  Net 437 ml   Filed Weights   07/01/21 1952  Weight: 95 kg    Examination:  General exam: Pleasant elderly male laying in bed, AAOx3, no distress HEENT: Aspen cervical collar on CVS: S1-S2, regular rate rhythm Lungs: Clear bilaterally Abdomen: Soft, nontender, bowel sounds present  extremities: No edema  Neuro: Moves all extremities, no localizing signs Skin: As above Psychiatry:  Mood & affect appropriate.     Data Reviewed:   CBC: Recent Labs  Lab 07/01/21 2138 07/02/21 0652  WBC 12.2* 10.3  NEUTROABS 10.7*  --   HGB 11.9* 11.5*  HCT 37.6* 34.8*  MCV 99.7 96.1  PLT 193 [id number]   Basic Metabolic Panel: Recent Labs  Lab 07/01/21 2138 07/02/21 0652  NA 138 138  K 4.7 4.2  CL 109 107  CO2 19* 23  GLUCOSE 182* 156*  BUN 21 20  CREATININE 1.09 1.10  CALCIUM 8.4* 8.5*   GFR: Estimated Creatinine Clearance: 52.9 mL/min (by C-G formula based on SCr of 1.1 mg/dL). Liver Function Tests: Recent Labs  Lab 07/01/21 2138  AST 31  ALT 17  ALKPHOS 81  BILITOT 0.9  PROT 7.4  ALBUMIN 3.8   No results for input(s): LIPASE, AMYLASE in the last 168 hours. No results for input(s): AMMONIA in the last 168 hours. Coagulation Profile: Recent Labs  Lab 07/01/21 2138  INR  1.1   Cardiac Enzymes: No results for input(s): CKTOTAL, CKMB, CKMBINDEX, TROPONINI in the last 168 hours. BNP (last 3 results) No results for input(s): PROBNP in the last 8760 hours. HbA1C: No results for input(s): HGBA1C in the last 72 hours. CBG: Recent Labs  Lab 07/03/21 1101 07/03/21 1548 07/03/21 2258 07/04/21 0759 07/04/21 1154  GLUCAP 179* 199* 174* 161* 256*   Lipid  Profile: No results for input(s): CHOL, HDL, LDLCALC, TRIG, CHOLHDL, LDLDIRECT in the last 72 hours. Thyroid Function Tests: Recent Labs    07/01/21 2138  TSH 1.246   Anemia Panel: No results for input(s): VITAMINB12, FOLATE, FERRITIN, TIBC, IRON, RETICCTPCT in the last 72 hours. Urine analysis:    Component Value Date/Time   COLORURINE YELLOW 09/04/2020 1136   APPEARANCEUR HAZY (A) 09/04/2020 1136   LABSPEC 1.016 09/04/2020 1136   PHURINE 7.0 09/04/2020 1136   GLUCOSEU NEGATIVE 09/04/2020 1136   HGBUR NEGATIVE 09/04/2020 [place] 09/04/2020 1136   BILIRUBINUR negative 04/19/2019 [place] 09/04/2020 1136   PROTEINUR NEGATIVE 09/04/2020 1136   UROBILINOGEN 0.2 04/19/2019 1354   UROBILINOGEN 1.0 05/21/2015 1050   NITRITE NEGATIVE 09/04/2020 1136   LEUKOCYTESUR NEGATIVE 09/04/2020 1136   Sepsis Labs: '@LABRCNTIP'$ (procalcitonin:4,lacticidven:4)  ) Recent Results (from the past 240 hour(s))  Resp Panel by RT-PCR (Flu A&B, Covid) Nasopharyngeal Swab     Status: None   Collection Time: 07/01/21  8:51 PM   Specimen: Nasopharyngeal Swab; Nasopharyngeal(NP) swabs in vial transport medium  Result Value Ref Range Status   SARS Coronavirus 2 by RT PCR NEGATIVE NEGATIVE Final    Comment: (NOTE) SARS-CoV-2 target nucleic acids are NOT DETECTED.  The SARS-CoV-2 RNA is generally detectable in upper respiratory specimens during the acute phase of infection. The lowest concentration of SARS-CoV-2 viral copies this assay can detect is 138 copies/mL. A negative result does not preclude SARS-Cov-2 infection and should not be used as the sole basis for treatment or other patient management decisions. A negative result may occur with  improper specimen collection/handling, submission of specimen other than nasopharyngeal swab, presence of viral mutation(s) within the areas targeted by this assay, and inadequate number of viral copies(<138 copies/mL). A  negative result must be combined with clinical observations, patient history, and epidemiological information. The expected result is Negative.  Fact Sheet for Patients:  EntrepreneurPulse.com.au  Fact Sheet for Healthcare Providers:  IncredibleEmployment.be  This test is no t yet approved or cleared by the [place] FDA and  has been authorized for detection and/or diagnosis of SARS-CoV-2 by FDA under an Emergency Use Authorization (EUA). This EUA will remain  in effect (meaning this test can be used) for the duration of the COVID-19 declaration under Section 564(b)(1) of the Act, 21 U.S.C.section 360bbb-3(b)(1), unless the authorization is terminated  or revoked sooner.       Influenza A by PCR NEGATIVE NEGATIVE Final   Influenza B by PCR NEGATIVE NEGATIVE Final    Comment: (NOTE) The Xpert Xpress SARS-CoV-2/FLU/RSV plus assay is intended as an aid in the diagnosis of influenza from Nasopharyngeal swab specimens and should not be used as a sole basis for treatment. Nasal washings and aspirates are unacceptable for Xpert Xpress SARS-CoV-2/FLU/RSV testing.  Fact Sheet for Patients: EntrepreneurPulse.com.au  Fact Sheet for Healthcare Providers: IncredibleEmployment.be  This test is not yet approved or cleared by the [place] FDA and has been authorized for detection and/or diagnosis of SARS-CoV-2 by FDA under an Emergency Use Authorization (EUA). This  EUA will remain in effect (meaning this test can be used) for the duration of the COVID-19 declaration under Section 564(b)(1) of the Act, 21 U.S.C. section 360bbb-3(b)(1), unless the authorization is terminated or revoked.  Performed at [facility], [address], [place], [place] [address]   MRSA Next Gen by PCR, Nasal     Status: None   Collection Time: 07/02/21  2:18 PM   Specimen: Nasal Mucosa; Nasal Swab  Result Value Ref Range Status    MRSA by PCR Next Gen NOT DETECTED NOT DETECTED Final    Comment: (NOTE) The GeneXpert MRSA Assay (FDA approved for NASAL specimens only), is one component of a comprehensive MRSA colonization surveillance program. It is not intended to diagnose MRSA infection nor to guide or monitor treatment for MRSA infections. Test performance is not FDA approved in patients less than 80 years old. Performed at [facility], [place] [address], [place], [place] [address]          Radiology Studies: No results found.   Scheduled Meds:  acetaminophen  650 mg Oral TID   alfuzosin  10 mg Oral QPM   atorvastatin  40 mg Oral Daily   cholecalciferol  5,000 Units Oral QHS   enzalutamide  80 mg Oral QPM   finasteride  5 mg Oral Daily   insulin aspart  0-9 Units Subcutaneous TID WC   pantoprazole  20 mg Oral Daily   pregabalin  75 mg Oral BID   topiramate  50 mg Oral BID   venlafaxine XR  37.5 mg Oral q morning   vitamin B-12  1,000 mcg Oral Daily   Continuous Infusions:   LOS: 2 days   Time spent: 73mn  PDomenic Polite MD [facility]   07/04/2021, 12:10 PM

## 2021-07-05 ENCOUNTER — Inpatient Hospital Stay (HOSPITAL_COMMUNITY)
Admission: RE | Admit: 2021-07-05 | Discharge: 2021-07-26 | DRG: 945 | Disposition: A | Discharge status: Home Health | Payer: Medicare Other | Source: Intra-hospital | Attending: Physical Medicine and Rehabilitation | Admitting: Physical Medicine and Rehabilitation

## 2021-07-05 DIAGNOSIS — F329 Major depressive disorder, single episode, unspecified: Secondary | ICD-10-CM | POA: Diagnosis not present

## 2021-07-05 DIAGNOSIS — Z91041 Radiographic dye allergy status: Secondary | ICD-10-CM | POA: Diagnosis not present

## 2021-07-05 DIAGNOSIS — Z87891 Personal history of nicotine dependence: Secondary | ICD-10-CM | POA: Diagnosis not present

## 2021-07-05 DIAGNOSIS — K5901 Slow transit constipation: Secondary | ICD-10-CM | POA: Diagnosis not present

## 2021-07-05 DIAGNOSIS — S12591D Other nondisplaced fracture of sixth cervical vertebra, subsequent encounter for fracture with routine healing: Secondary | ICD-10-CM | POA: Diagnosis not present

## 2021-07-05 DIAGNOSIS — E1142 Type 2 diabetes mellitus with diabetic polyneuropathy: Secondary | ICD-10-CM | POA: Diagnosis present

## 2021-07-05 DIAGNOSIS — M353 Polymyalgia rheumatica: Secondary | ICD-10-CM | POA: Diagnosis not present

## 2021-07-05 DIAGNOSIS — Z923 Personal history of irradiation: Secondary | ICD-10-CM | POA: Diagnosis not present

## 2021-07-05 DIAGNOSIS — Z743 Need for continuous supervision: Secondary | ICD-10-CM | POA: Diagnosis not present

## 2021-07-05 DIAGNOSIS — S199XXA Unspecified injury of neck, initial encounter: Secondary | ICD-10-CM

## 2021-07-05 DIAGNOSIS — F3341 Major depressive disorder, recurrent, in partial remission: Secondary | ICD-10-CM

## 2021-07-05 DIAGNOSIS — Z96652 Presence of left artificial knee joint: Secondary | ICD-10-CM | POA: Diagnosis not present

## 2021-07-05 DIAGNOSIS — E7800 Pure hypercholesterolemia, unspecified: Secondary | ICD-10-CM | POA: Diagnosis not present

## 2021-07-05 DIAGNOSIS — K5900 Constipation, unspecified: Secondary | ICD-10-CM | POA: Diagnosis not present

## 2021-07-05 DIAGNOSIS — E1165 Type 2 diabetes mellitus with hyperglycemia: Secondary | ICD-10-CM | POA: Diagnosis not present

## 2021-07-05 DIAGNOSIS — Z981 Arthrodesis status: Secondary | ICD-10-CM | POA: Diagnosis not present

## 2021-07-05 DIAGNOSIS — Z8673 Personal history of transient ischemic attack (TIA), and cerebral infarction without residual deficits: Secondary | ICD-10-CM | POA: Diagnosis not present

## 2021-07-05 DIAGNOSIS — Z888 Allergy status to other drugs, medicaments and biological substances status: Secondary | ICD-10-CM

## 2021-07-05 DIAGNOSIS — S14127S Central cord syndrome at C7 level of cervical spinal cord, sequela: Secondary | ICD-10-CM | POA: Diagnosis not present

## 2021-07-05 DIAGNOSIS — Z79899 Other long term (current) drug therapy: Secondary | ICD-10-CM

## 2021-07-05 DIAGNOSIS — Z8711 Personal history of peptic ulcer disease: Secondary | ICD-10-CM | POA: Diagnosis not present

## 2021-07-05 DIAGNOSIS — S12691D Other nondisplaced fracture of seventh cervical vertebra, subsequent encounter for fracture with routine healing: Secondary | ICD-10-CM | POA: Diagnosis not present

## 2021-07-05 DIAGNOSIS — R0989 Other specified symptoms and signs involving the circulatory and respiratory systems: Secondary | ICD-10-CM | POA: Diagnosis not present

## 2021-07-05 DIAGNOSIS — S14127D Central cord syndrome at C7 level of cervical spinal cord, subsequent encounter: Principal | ICD-10-CM

## 2021-07-05 DIAGNOSIS — G8254 Quadriplegia, C5-C7 incomplete: Secondary | ICD-10-CM | POA: Diagnosis present

## 2021-07-05 DIAGNOSIS — F32A Depression, unspecified: Secondary | ICD-10-CM | POA: Diagnosis not present

## 2021-07-05 DIAGNOSIS — S14127A Central cord syndrome at C7 level of cervical spinal cord, initial encounter: Secondary | ICD-10-CM | POA: Diagnosis not present

## 2021-07-05 DIAGNOSIS — E876 Hypokalemia: Secondary | ICD-10-CM | POA: Diagnosis present

## 2021-07-05 DIAGNOSIS — S12500A Unspecified displaced fracture of sixth cervical vertebra, initial encounter for closed fracture: Secondary | ICD-10-CM | POA: Diagnosis present

## 2021-07-05 DIAGNOSIS — R32 Unspecified urinary incontinence: Secondary | ICD-10-CM | POA: Diagnosis not present

## 2021-07-05 DIAGNOSIS — I10 Essential (primary) hypertension: Secondary | ICD-10-CM | POA: Diagnosis present

## 2021-07-05 DIAGNOSIS — S12501D Unspecified nondisplaced fracture of sixth cervical vertebra, subsequent encounter for fracture with routine healing: Secondary | ICD-10-CM | POA: Diagnosis not present

## 2021-07-05 DIAGNOSIS — E119 Type 2 diabetes mellitus without complications: Secondary | ICD-10-CM | POA: Diagnosis not present

## 2021-07-05 DIAGNOSIS — C7951 Secondary malignant neoplasm of bone: Secondary | ICD-10-CM | POA: Diagnosis present

## 2021-07-05 DIAGNOSIS — R4789 Other speech disturbances: Secondary | ICD-10-CM

## 2021-07-05 DIAGNOSIS — R739 Hyperglycemia, unspecified: Secondary | ICD-10-CM | POA: Diagnosis not present

## 2021-07-05 DIAGNOSIS — K592 Neurogenic bowel, not elsewhere classified: Secondary | ICD-10-CM | POA: Diagnosis not present

## 2021-07-05 DIAGNOSIS — R531 Weakness: Secondary | ICD-10-CM | POA: Diagnosis not present

## 2021-07-05 DIAGNOSIS — M2578 Osteophyte, vertebrae: Secondary | ICD-10-CM | POA: Diagnosis not present

## 2021-07-05 DIAGNOSIS — S0003XD Contusion of scalp, subsequent encounter: Secondary | ICD-10-CM | POA: Diagnosis not present

## 2021-07-05 DIAGNOSIS — G4700 Insomnia, unspecified: Secondary | ICD-10-CM | POA: Diagnosis present

## 2021-07-05 DIAGNOSIS — C61 Malignant neoplasm of prostate: Secondary | ICD-10-CM | POA: Diagnosis present

## 2021-07-05 DIAGNOSIS — Z800 Family history of malignant neoplasm of digestive organs: Secondary | ICD-10-CM

## 2021-07-05 DIAGNOSIS — R2689 Other abnormalities of gait and mobility: Secondary | ICD-10-CM | POA: Diagnosis not present

## 2021-07-05 DIAGNOSIS — S064X9A Epidural hemorrhage with loss of consciousness of unspecified duration, initial encounter: Secondary | ICD-10-CM | POA: Diagnosis not present

## 2021-07-05 DIAGNOSIS — W19XXXD Unspecified fall, subsequent encounter: Secondary | ICD-10-CM | POA: Diagnosis present

## 2021-07-05 DIAGNOSIS — M6281 Muscle weakness (generalized): Secondary | ICD-10-CM | POA: Diagnosis not present

## 2021-07-05 DIAGNOSIS — N39490 Overflow incontinence: Secondary | ICD-10-CM | POA: Diagnosis not present

## 2021-07-05 DIAGNOSIS — D62 Acute posthemorrhagic anemia: Secondary | ICD-10-CM

## 2021-07-05 DIAGNOSIS — G479 Sleep disorder, unspecified: Secondary | ICD-10-CM

## 2021-07-05 LAB — GLUCOSE, CAPILLARY
Glucose-Capillary: 169 mg/dL — ABNORMAL HIGH (ref 70–99)
Glucose-Capillary: 258 mg/dL — ABNORMAL HIGH (ref 70–99)
Glucose-Capillary: 91 mg/dL (ref 70–99)
Glucose-Capillary: 192 mg/dL — ABNORMAL HIGH (ref 70–99)

## 2021-07-05 MED ORDER — ACETAMINOPHEN 325 MG PO TABS
325.0000 mg | ORAL_TABLET | ORAL | Status: DC | PRN
  Filled 2021-07-05: qty 2

## 2021-07-05 MED ORDER — ALUM & MAG HYDROXIDE-SIMETH 200-200-20 MG/5ML PO SUSP
30.0000 mL | ORAL | Status: DC | PRN
Start: 2021-07-05 — End: 2021-07-26

## 2021-07-05 MED ORDER — VENLAFAXINE HCL ER 37.5 MG PO CP24
37.5000 mg | ORAL_CAPSULE | Freq: Every morning | ORAL | Status: DC
  Administered 2021-07-06 – 2021-07-26 (×21): 37.5 mg via ORAL
  Filled 2021-07-05 (×23): qty 1

## 2021-07-05 MED ORDER — SENNOSIDES-DOCUSATE SODIUM 8.6-50 MG PO TABS
1.0000 | ORAL_TABLET | Freq: Two times a day (BID) | ORAL | Status: DC
  Administered 2021-07-05 – 2021-07-10 (×12): 1 via ORAL
  Filled 2021-07-05 (×11): qty 1

## 2021-07-05 MED ORDER — DIPHENHYDRAMINE HCL 12.5 MG/5ML PO ELIX: 12.5000 mg | ORAL_SOLUTION | Freq: Four times a day (QID) | ORAL | Status: DC | PRN

## 2021-07-05 MED ORDER — SENNOSIDES-DOCUSATE SODIUM 8.6-50 MG PO TABS: 1.0000 | ORAL_TABLET | Freq: Two times a day (BID) | ORAL | Status: DC

## 2021-07-05 MED ORDER — GUAIFENESIN-DM 100-10 MG/5ML PO SYRP
5.0000 mL | ORAL_SOLUTION | Freq: Four times a day (QID) | ORAL | Status: DC | PRN
  Administered 2021-07-07 – 2021-07-08 (×3): 10 mL via ORAL
  Filled 2021-07-05 (×3): qty 10

## 2021-07-05 MED ORDER — TOPIRAMATE 25 MG PO TABS
50.0000 mg | ORAL_TABLET | Freq: Two times a day (BID) | ORAL | Status: DC
  Administered 2021-07-05 – 2021-07-26 (×42): 50 mg via ORAL
  Filled 2021-07-05 (×43): qty 2

## 2021-07-05 MED ORDER — PROCHLORPERAZINE EDISYLATE 10 MG/2ML IJ SOLN: 5.0000 mg | Freq: Four times a day (QID) | INTRAMUSCULAR | Status: DC | PRN

## 2021-07-05 MED ORDER — ENZALUTAMIDE 80 MG PO TABS
80.0000 mg | ORAL_TABLET | Freq: Every evening | ORAL | Status: DC
Start: 2021-07-06 — End: 2021-07-26
  Administered 2021-07-06 – 2021-07-25 (×20): 80 mg via ORAL
  Filled 2021-07-05 (×20): qty 1

## 2021-07-05 MED ORDER — FLEET ENEMA 7-19 GM/118ML RE ENEM
1.0000 | ENEMA | Freq: Once | RECTAL | Status: DC | PRN
  Filled 2021-07-05: qty 1

## 2021-07-05 MED ORDER — ALFUZOSIN HCL ER 10 MG PO TB24
10.0000 mg | ORAL_TABLET | Freq: Every evening | ORAL | Status: DC
  Administered 2021-07-06 – 2021-07-25 (×20): 10 mg via ORAL
  Filled 2021-07-05 (×20): qty 1

## 2021-07-05 MED ORDER — INSULIN ASPART 100 UNIT/ML IJ SOLN
0.0000 [IU] | Freq: Three times a day (TID) | INTRAMUSCULAR | Status: DC
  Administered 2021-07-06: 3 [IU] via SUBCUTANEOUS
  Administered 2021-07-06 – 2021-07-07 (×2): 2 [IU] via SUBCUTANEOUS
  Administered 2021-07-07: 3 [IU] via SUBCUTANEOUS
  Administered 2021-07-07 – 2021-07-08 (×2): 2 [IU] via SUBCUTANEOUS
  Administered 2021-07-08 (×2): 3 [IU] via SUBCUTANEOUS
  Administered 2021-07-09: 2 [IU] via SUBCUTANEOUS
  Administered 2021-07-09: 3 [IU] via SUBCUTANEOUS
  Administered 2021-07-09 – 2021-07-10 (×3): 2 [IU] via SUBCUTANEOUS
  Administered 2021-07-10 – 2021-07-11 (×2): 1 [IU] via SUBCUTANEOUS
  Administered 2021-07-11 (×2): 2 [IU] via SUBCUTANEOUS
  Administered 2021-07-12 (×2): 1 [IU] via SUBCUTANEOUS
  Administered 2021-07-12 – 2021-07-13 (×2): 2 [IU] via SUBCUTANEOUS
  Administered 2021-07-13: 1 [IU] via SUBCUTANEOUS
  Administered 2021-07-13: 2 [IU] via SUBCUTANEOUS
  Administered 2021-07-14 (×3): 1 [IU] via SUBCUTANEOUS
  Administered 2021-07-15 – 2021-07-16 (×4): 2 [IU] via SUBCUTANEOUS
  Administered 2021-07-16 – 2021-07-22 (×12): 1 [IU] via SUBCUTANEOUS
  Administered 2021-07-23 (×2): 2 [IU] via SUBCUTANEOUS
  Administered 2021-07-24 – 2021-07-25 (×2): 1 [IU] via SUBCUTANEOUS

## 2021-07-05 MED ORDER — ATORVASTATIN CALCIUM 40 MG PO TABS
40.0000 mg | ORAL_TABLET | Freq: Every day | ORAL | Status: DC
  Administered 2021-07-06 – 2021-07-26 (×21): 40 mg via ORAL
  Filled 2021-07-05 (×21): qty 1

## 2021-07-05 MED ORDER — PROCHLORPERAZINE 25 MG RE SUPP: 12.5000 mg | Freq: Four times a day (QID) | RECTAL | Status: DC | PRN

## 2021-07-05 MED ORDER — FINASTERIDE 5 MG PO TABS
5.0000 mg | ORAL_TABLET | Freq: Every day | ORAL | Status: DC
  Administered 2021-07-06 – 2021-07-26 (×21): 5 mg via ORAL
  Filled 2021-07-05 (×22): qty 1

## 2021-07-05 MED ORDER — TRAZODONE HCL 50 MG PO TABS
25.0000 mg | ORAL_TABLET | Freq: Every evening | ORAL | Status: DC | PRN
  Administered 2021-07-05 – 2021-07-07 (×2): 50 mg via ORAL
  Filled 2021-07-05 (×3): qty 1

## 2021-07-05 MED ORDER — ACETAMINOPHEN 325 MG PO TABS
650.0000 mg | ORAL_TABLET | Freq: Three times a day (TID) | ORAL | Status: DC
  Administered 2021-07-05 – 2021-07-26 (×58): 650 mg via ORAL
  Filled 2021-07-05 (×59): qty 2

## 2021-07-05 MED ORDER — BISACODYL 10 MG RE SUPP: 10.0000 mg | Freq: Every day | RECTAL | Status: DC | PRN

## 2021-07-05 MED ORDER — PANTOPRAZOLE SODIUM 20 MG PO TBEC
20.0000 mg | DELAYED_RELEASE_TABLET | Freq: Every day | ORAL | Status: DC
  Administered 2021-07-06 – 2021-07-26 (×21): 20 mg via ORAL
  Filled 2021-07-05 (×22): qty 1

## 2021-07-05 MED ORDER — VITAMIN B-12 1000 MCG PO TABS
1000.0000 ug | ORAL_TABLET | Freq: Every day | ORAL | Status: DC
  Administered 2021-07-06 – 2021-07-26 (×21): 1000 ug via ORAL
  Filled 2021-07-05 (×21): qty 1

## 2021-07-05 MED ORDER — POLYETHYLENE GLYCOL 3350 17 G PO PACK
17.0000 g | PACK | Freq: Every day | ORAL | Status: DC | PRN
  Administered 2021-07-06: 17 g via ORAL
  Filled 2021-07-05: qty 1

## 2021-07-05 MED ORDER — VITAMIN D 25 MCG (1000 UNIT) PO TABS
5000.0000 [IU] | ORAL_TABLET | Freq: Every day | ORAL | Status: DC
  Administered 2021-07-05 – 2021-07-25 (×21): 5000 [IU] via ORAL
  Filled 2021-07-05 (×21): qty 5

## 2021-07-05 MED ORDER — OXYCODONE HCL 5 MG PO TABS
5.0000 mg | ORAL_TABLET | Freq: Four times a day (QID) | ORAL | Status: DC | PRN
  Administered 2021-07-06 – 2021-07-26 (×13): 5 mg via ORAL
  Filled 2021-07-05 (×16): qty 1

## 2021-07-05 MED ORDER — MIDODRINE HCL 10 MG PO TABS: 5.0000 mg | ORAL_TABLET | Freq: Two times a day (BID) | ORAL | Status: DC

## 2021-07-05 MED ORDER — PREGABALIN 75 MG PO CAPS
75.0000 mg | ORAL_CAPSULE | Freq: Two times a day (BID) | ORAL | Status: DC
  Administered 2021-07-05 – 2021-07-26 (×42): 75 mg via ORAL
  Filled 2021-07-05 (×43): qty 1

## 2021-07-05 MED ORDER — PROCHLORPERAZINE MALEATE 5 MG PO TABS: 5.0000 mg | ORAL_TABLET | Freq: Four times a day (QID) | ORAL | Status: DC | PRN

## 2021-07-05 NOTE — Progress Notes (Signed)
Inpatient Rehabilitation Medication Review by a Pharmacist  A complete drug regimen review was completed for this patient to identify any potential clinically significant medication issues.  Clinically significant medication issues were identified:  no  Check AMION for pharmacist assigned to patient if future medication questions/issues arise during this admission.  Pharmacist comments:   Time spent performing this drug regimen review (minutes):  20   Maeghan Canny 07/05/2021 8:51 PM

## 2021-07-05 NOTE — Discharge Summary (Signed)
Physician Discharge Summary  Jared Bridges [id number] DOB: [birth year] DOA: 07/01/2021  PCP: Doree Albee, MD  Admit date: 07/01/2021 Discharge date: 07/05/2021  Time spent: 35 minutes  Recommendations for Outpatient Follow-up:  CIR for rehabilitation Follow-up with neurosurgery in 4 to 6 weeks   Discharge Diagnoses:  C6-C7 fracture Small epidural hematoma Mechanical fall History of prostate cancer with bony metastasis Type 2 diabetes mellitus Hypertension Chronic diastolic CHF BPH DO NOT RESUSCITATE   Discharge Condition: Stable  Diet recommendation: Diabetic, aspiration precautions  Filed Weights   07/01/21 1952  Weight: 95 kg    History of present illness:  87 year old male with history of prostate cancer, bony mets, type 2 diabetes mellitus, hypertension, dyslipidemia, hearing impairment presented to [name] ED after mechanical fall, slipping on soap, was deemed stable discharged home return to ED the same day due to upper extremity numbness and some pain, repeat CT C-spine was abnormal subsequently transferred to [name] Course:   C 6/C7 fracture/mechanical fall Small epidural hematoma -Following mechanical fall, EDP discussed with neurosurgery Dr. Christella Noa, recommended nonoperative management, Aspen collar, PT OT, conservative management -Physical therapy evaluation completed, CIR consulted -Discharge to CIR for rehabilitation -Follow-up with neurosurgery in 3 to 4 weeks   Type 2 diabetes mellitus -CBGs are stable, continue sliding scale insulin   Chronic diastolic CHF -Clinically euvolemic, did not require diuretics   Metastatic prostate cancer -Continue Xtandi -Follow-up with oncology   BPH -Continue Proscar   Generalized weakness/fall -CIR for rehab   Home meds -Resumed Lexapro, Protonix, Lyrica, Topamax, Effexor    Discharge Exam: Vitals:   07/05/21 1114 07/05/21 1537  BP: (!) 137/59 128/60  Pulse: 94 85   Resp: (!) 24 17  Temp: 100 F (37.8 C) 99.6 F (37.6 C)  SpO2: 98% 96%    General: AAOx3 HEENT: Aspen collar on Cardiovascular: S1-S2, regular rate rhythm Respiratory: Clear anteriorly  Discharge Instructions    Allergies as of 07/05/2021       Reactions   Iodine Hives, Itching, Other (See Comments)   very allergic(per daughter), can pre-med with benadryl   Calan [verapamil] Other (See Comments)   Weakness   Cymbalta [duloxetine Hcl] Other (See Comments)   Makes patient have jerking motions.   Iodinated Diagnostic Agents Hives, Itching, Other (See Comments)   Can pre-med with benadryl   Latex Other (See Comments)   Redness iritation   Lisinopril Other (See Comments)   Makes patient have jerking motions.   Neurontin [gabapentin] Other (See Comments)   Makes patient have jerking motions.   Zoloft [sertraline Hcl] Diarrhea, Other (See Comments)   Makes patient have jerking motions.   Valium [diazepam] Other (See Comments)   "Did not work"   Herbalist Hcl] Other (See Comments)   Dizziness   Glimepiride Other (See Comments)   "Did not work"   Melatonin Nausea Only   Tape Other (See Comments)   Skin irritation        Medication List     STOP taking these medications    fluticasone 50 MCG/ACT nasal spray Commonly known as: FLONASE       TAKE these medications    alfuzosin 10 MG 24 hr tablet Commonly known as: UROXATRAL Take 10 mg by mouth every evening.   atorvastatin 40 MG tablet Commonly known as: LIPITOR TAKE 1 TABLET BY MOUTH DAILY   Ciclopirox 0.77 % gel Apply 1 application topically 2 (two) times daily. to affected area(s)  diclofenac sodium 1 % Gel Commonly known as: VOLTAREN Apply 2 g topically daily as needed (for pain).   enzalutamide 80 MG tablet Commonly known as: XTANDI Take 80 mg by mouth daily.   escitalopram 20 MG tablet Commonly known as: LEXAPRO Take 20 mg by mouth daily.   finasteride 5 MG tablet Commonly known  as: PROSCAR Take 1 tablet (5 mg total) by mouth daily.   fluocinonide cream 0.05 % Commonly known as: LIDEX Apply 1 application topically daily as needed (for irritation).   HM Pain Relief Extra Strength 500 MG tablet Generic drug: acetaminophen Take 1,000 mg by mouth every 8 (eight) hours as needed for pain.   midodrine 10 MG tablet Commonly known as: PROAMATINE Take 0.5 tablets (5 mg total) by mouth 2 (two) times daily. What changed:  how much to take when to take this   Myrbetriq 50 MG Tb24 tablet Generic drug: mirabegron ER Take 50 mg by mouth daily.   pantoprazole 20 MG tablet Commonly known as: PROTONIX TAKE 1 TABLET BY MOUTH DAILY   pregabalin 75 MG capsule Commonly known as: LYRICA Take 75 mg by mouth 2 (two) times daily.   senna-docusate 8.6-50 MG tablet Commonly known as: Senokot-S Take 1 tablet by mouth 2 (two) times daily.   topiramate 50 MG tablet Commonly known as: TOPAMAX Take 50 mg by mouth 2 (two) times daily.   venlafaxine XR 37.5 MG 24 hr capsule Commonly known as: EFFEXOR-XR TAKE ONE CAPSULE BY MOUTH EVERY MORNING   vitamin B-12 500 MCG tablet Commonly known as: CYANOCOBALAMIN Take 1,000 mcg by mouth daily.   Vitamin D3 125 MCG (5000 UT) Caps Take 5,000 Units by mouth at bedtime.       Allergies  Allergen Reactions   Iodine Hives, Itching and Other (See Comments)    very allergic(per daughter), can pre-med with benadryl   Calan [Verapamil] Other (See Comments)    Weakness   Cymbalta [Duloxetine Hcl] Other (See Comments)    Makes patient have jerking motions.   Iodinated Diagnostic Agents Hives, Itching and Other (See Comments)    Can pre-med with benadryl   Latex Other (See Comments)    Redness iritation   Lisinopril Other (See Comments)    Makes patient have jerking motions.   Neurontin [Gabapentin] Other (See Comments)    Makes patient have jerking motions.   Zoloft [Sertraline Hcl] Diarrhea and Other (See Comments)    Makes  patient have jerking motions.   Valium [Diazepam] Other (See Comments)    "Did not work"   Flomax [Tamsulosin Hcl] Other (See Comments)    Dizziness   Glimepiride Other (See Comments)    "Did not work"   Melatonin Nausea Only   Tape Other (See Comments)    Skin irritation      The results of significant diagnostics from this hospitalization (including imaging, microbiology, ancillary and laboratory) are listed below for reference.    Significant Diagnostic Studies: CT HEAD WO CONTRAST (5MM)  Result Date: 07/01/2021 CLINICAL DATA:  Head trauma, minor (Age >= 65y) Generalized weakness. Fall since being evaluated earlier today for a prior fall. EXAM: CT HEAD WITHOUT CONTRAST TECHNIQUE: Contiguous axial images were obtained from the base of the skull through the vertex without intravenous contrast. COMPARISON:  Head CT earlier today. FINDINGS: Brain: Stable atrophy and chronic small vessel ischemia. No intracranial hemorrhage, mass effect, or midline shift. No hydrocephalus. The basilar cisterns are patent. No evidence of territorial infarct or acute ischemia. No extra-axial  or intracranial fluid collection. Vascular: Atherosclerosis of skullbase vasculature without hyperdense vessel or abnormal calcification. Skull: No fracture or focal lesion. Sinuses/Orbits: Paranasal sinuses and mastoid air cells are clear. Remote medial right orbital wall fracture again seen. Bilateral cataract resection. Other: Left occipital scalp contusion slightly improved from earlier today. IMPRESSION: 1. Small left occipital scalp hematoma. No acute intracranial abnormality. No skull fracture. 2. Stable atrophy and chronic small vessel ischemia. Electronically Signed   By: Keith Rake M.D.   On: 07/01/2021 21:20   CT Head Wo Contrast  Result Date: 07/01/2021 CLINICAL DATA:  Head trauma, mod-severe; Neck trauma (Age >= 65y). Tripped over mat and fell today hitting the back of the head. Bilateral shoulder and neck  pain. EXAM: CT HEAD WITHOUT CONTRAST CT CERVICAL SPINE WITHOUT CONTRAST TECHNIQUE: Multidetector CT imaging of the head and cervical spine was performed following the standard protocol without intravenous contrast. Multiplanar CT image reconstructions of the cervical spine were also generated. COMPARISON:  Head CT 12/28/2017. Head MRI 12/29/2017. Chest CT 04/25/2015. FINDINGS: CT HEAD FINDINGS Brain: There is no evidence of an acute infarct, intracranial hemorrhage, mass, midline shift, or extra-axial fluid collection. Patchy hypodensities in the cerebral white matter bilaterally may have mildly progressed from the prior CT and are nonspecific but compatible with moderate chronic small vessel ischemic disease. There is mild cerebral atrophy. Vascular: Calcified atherosclerosis at the skull base. No hyperdense vessel. Skull: No acute fracture or suspicious osseous lesion. Sinuses/Orbits: Visualized paranasal sinuses are clear. Small chronic left mastoid effusion. Chronic medial right orbital fracture. Bilateral cataract extraction. Other: Left occipital scalp contusion. CT CERVICAL SPINE FINDINGS Alignment: Cervical spine straightening.  No listhesis. Skull base and vertebrae: Widespread interbody ankylosis is present in the cervical and included upper thoracic spine. A fracture through an anterior vertebral osteophyte at C6-7 is new from 2016 but favored to be nonacute. No posterior element fracture or suspicious osseous lesion is identified. Soft tissues and spinal canal: No prevertebral fluid or swelling. No visible canal hematoma. Disc levels: Widespread ankylosis. No evidence of high-grade spinal canal stenosis. Multilevel neural foraminal stenosis due to facet and uncovertebral hypertrophy including severe left neural foraminal stenosis at C3-4 and C4-5. Upper chest: Clear lung apices. Other: Mild carotid atherosclerosis. Small thyroid nodules measuring up to approximately 1 cm for which no imaging follow-up is  recommended. IMPRESSION: 1. No evidence of acute intracranial abnormality. 2. Moderate chronic small vessel ischemic disease. 3. Left occipital scalp contusion. 4. 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 If there is clinical concern for an acute fracture or acute soft tissue injury in the cervical spine, consider MRI for further evaluation. Electronically Signed   By: Logan Bores M.D.   On: 07/01/2021 14:40   CT Cervical Spine Wo Contrast  Result Date: 07/01/2021 CLINICAL DATA:  Neck trauma (Age >= 65y) Generalized weakness, patient can walk since fall. Fall occurred after being evaluated for a prior fall earlier today. EXAM: CT CERVICAL SPINE WITHOUT CONTRAST TECHNIQUE: Multidetector CT imaging of the cervical spine was performed without intravenous contrast. Multiplanar CT image reconstructions were also generated. COMPARISON:  Cervical spine CT earlier today. FINDINGS: Alignment: Straightening of normal lordosis. No traumatic subluxation. Skull base and vertebrae: No acute fracture. Flowing anterior osteophytes in bony ankylosis. Fracture through an anterior vertebral osteophyte at C6-C7 is unchanged from earlier today and felt to be chronic. Dens and skull base are intact. Soft tissues and  spinal canal: No prevertebral fluid or swelling. No visible canal hematoma. Disc levels: Diffuse thoracic ankylosis. Stable facet hypertrophy. No change from earlier today. Upper chest: Nonacute. Other: None. IMPRESSION: 1. No acute fracture or subluxation of the cervical spine. 2. Stable exam from CT earlier today. Again seen diffuse cervical ankylosis with fracture through the anterior vertebral osteophyte at C6-C7. This is favored to be chronic, but if there is clinical concern for acute fracture, MRI is again recommended. Electronically Signed   By: Keith Rake M.D.   On: 07/01/2021 21:17   CT Cervical  Spine Wo Contrast  Result Date: 07/01/2021 CLINICAL DATA:  Head trauma, mod-severe; Neck trauma (Age >= 65y). Tripped over mat and fell today hitting the back of the head. Bilateral shoulder and neck pain. EXAM: CT HEAD WITHOUT CONTRAST CT CERVICAL SPINE WITHOUT CONTRAST TECHNIQUE: Multidetector CT imaging of the head and cervical spine was performed following the standard protocol without intravenous contrast. Multiplanar CT image reconstructions of the cervical spine were also generated. COMPARISON:  Head CT 12/28/2017. Head MRI 12/29/2017. Chest CT 04/25/2015. FINDINGS: CT HEAD FINDINGS Brain: There is no evidence of an acute infarct, intracranial hemorrhage, mass, midline shift, or extra-axial fluid collection. Patchy hypodensities in the cerebral white matter bilaterally may have mildly progressed from the prior CT and are nonspecific but compatible with moderate chronic small vessel ischemic disease. There is mild cerebral atrophy. Vascular: Calcified atherosclerosis at the skull base. No hyperdense vessel. Skull: No acute fracture or suspicious osseous lesion. Sinuses/Orbits: Visualized paranasal sinuses are clear. Small chronic left mastoid effusion. Chronic medial right orbital fracture. Bilateral cataract extraction. Other: Left occipital scalp contusion. CT CERVICAL SPINE FINDINGS Alignment: Cervical spine straightening.  No listhesis. Skull base and vertebrae: Widespread interbody ankylosis is present in the cervical and included upper thoracic spine. A fracture through an anterior vertebral osteophyte at C6-7 is new from 2016 but favored to be nonacute. No posterior element fracture or suspicious osseous lesion is identified. Soft tissues and spinal canal: No prevertebral fluid or swelling. No visible canal hematoma. Disc levels: Widespread ankylosis. No evidence of high-grade spinal canal stenosis. Multilevel neural foraminal stenosis due to facet and uncovertebral hypertrophy including severe left  neural foraminal stenosis at C3-4 and C4-5. Upper chest: Clear lung apices. Other: Mild carotid atherosclerosis. Small thyroid nodules measuring up to approximately 1 cm for which no imaging follow-up is recommended. IMPRESSION: 1. No evidence of acute intracranial abnormality. 2. Moderate chronic small vessel ischemic disease. 3. Left occipital scalp contusion. 4. 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 If there is clinical concern for an acute fracture or acute soft tissue injury in the cervical spine, consider MRI for further evaluation. Electronically Signed   By: Logan Bores M.D.   On: 07/01/2021 14:40   MR BRAIN WO CONTRAST  Result Date: 07/02/2021 CLINICAL DATA:  Generalized weakness and fall EXAM: MRI HEAD WITHOUT CONTRAST TECHNIQUE: Multiplanar, multiecho pulse sequences of the brain and surrounding structures were obtained without intravenous contrast. COMPARISON:  12/29/2017 FINDINGS: Brain: No acute infarct, mass effect or extra-axial collection. No acute or chronic hemorrhage. Hyperintense T2-weighted signal is moderately widespread throughout the white matter. Generalized volume loss without a clear lobar predilection. The midline structures are normal. Vascular: Major flow voids are preserved. Skull and upper cervical spine: Normal calvarium and skull base. Visualized upper cervical spine and soft tissues are normal. Sinuses/Orbits:No paranasal sinus fluid levels or advanced  mucosal thickening. No mastoid or middle ear effusion. Normal orbits. IMPRESSION: 1. No acute intracranial abnormality. 2. Moderate chronic microvascular ischemic disease and generalized volume loss. Electronically Signed   By: Ulyses Jarred M.D.   On: 07/02/2021 00:19   MR Cervical Spine Wo Contrast  Result Date: 07/02/2021 CLINICAL DATA:  Fall EXAM: MRI CERVICAL SPINE WITHOUT CONTRAST TECHNIQUE: Multiplanar,  multisequence MR imaging of the cervical spine was performed. No intravenous contrast was administered. COMPARISON:  None. FINDINGS: Alignment: Physiologic. Vertebrae: There is diffuse cervical ankylosis with a fractured anterior vertebral osteophyte at C6-7. There is mild C6-7 endplate edema and edema and disc space. There is a focal tear of ligamentum flavum at the C6-7 level. Cord: There is dorsal epidural hematoma extending from C5-T1 measuring 8 mm in AP thickness. This severely attenuates the thecal sac and causes mild compression of the spinal cord. Posterior Fossa, vertebral arteries, paraspinal tissues: Negative. Disc levels: Aside from the above described C6-7 disc space abnormalities, there is only mild degenerative disc disease. IMPRESSION: 1. Dorsal epidural hematoma extending from C5-T1 measuring 8 mm in AP thickness, severely narrowing the thecal sac and mildly compressing the spinal cord. 2. Focal tear of the ligamentum flavum at C6-7. Fracture at C6-7 is likely acute or early subacute. 3. 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 Critical Value/emergent results were called by telephone at the time of interpretation on 07/02/2021 at 12:37 am to Dr. Betsey Holiday, who verbally acknowledged these results. Electronically Signed   By: Ulyses Jarred M.D.   On: 07/02/2021 00:38    Microbiology: Recent Results (from the past 240 hour(s))  Resp Panel by RT-PCR (Flu A&B, Covid) Nasopharyngeal Swab     Status: None   Collection Time: 07/01/21  8:51 PM   Specimen: Nasopharyngeal Swab; Nasopharyngeal(NP) swabs in vial transport medium  Result Value Ref Range Status   SARS Coronavirus 2 by RT PCR NEGATIVE NEGATIVE Final    Comment: (NOTE) SARS-CoV-2 target nucleic acids are NOT DETECTED.  The SARS-CoV-2 RNA is generally detectable in upper respiratory specimens during the acute phase of infection. The lowest concentration of SARS-CoV-2 viral copies this assay can  detect is 138 copies/mL. A negative result does not preclude SARS-Cov-2 infection and should not be used as the sole basis for treatment or other patient management decisions. A negative result may occur with  improper specimen collection/handling, submission of specimen other than nasopharyngeal swab, presence of viral mutation(s) within the areas targeted by this assay, and inadequate number of viral copies(<138 copies/mL). A negative result must be combined with clinical observations, patient history, and epidemiological information. The expected result is Negative.  Fact Sheet for Patients:  EntrepreneurPulse.com.au  Fact Sheet for Healthcare Providers:  IncredibleEmployment.be  This test is no t yet approved or cleared by the [place] FDA and  has been authorized for detection and/or diagnosis of SARS-CoV-2 by FDA under an Emergency Use Authorization (EUA). This EUA will remain  in effect (meaning this test can be used) for the duration of the COVID-19 declaration under Section 564(b)(1) of the Act, 21 U.S.C.section 360bbb-3(b)(1), unless the authorization is terminated  or revoked sooner.       Influenza A by PCR NEGATIVE NEGATIVE Final   Influenza B by PCR NEGATIVE NEGATIVE Final    Comment: (NOTE) The Xpert Xpress SARS-CoV-2/FLU/RSV plus assay is intended as an aid in the diagnosis of influenza from Nasopharyngeal swab specimens and should not be used as a sole basis for treatment. Nasal  washings and aspirates are unacceptable for Xpert Xpress SARS-CoV-2/FLU/RSV testing.  Fact Sheet for Patients: EntrepreneurPulse.com.au  Fact Sheet for Healthcare Providers: IncredibleEmployment.be  This test is not yet approved or cleared by the [place] FDA and has been authorized for detection and/or diagnosis of SARS-CoV-2 by FDA under an Emergency Use Authorization (EUA). This EUA will remain in  effect (meaning this test can be used) for the duration of the COVID-19 declaration under Section 564(b)(1) of the Act, 21 U.S.C. section 360bbb-3(b)(1), unless the authorization is terminated or revoked.  Performed at [facility], [address], [place], [place] [address]   MRSA Next Gen by PCR, Nasal     Status: None   Collection Time: 07/02/21  2:18 PM   Specimen: Nasal Mucosa; Nasal Swab  Result Value Ref Range Status   MRSA by PCR Next Gen NOT DETECTED NOT DETECTED Final    Comment: (NOTE) The GeneXpert MRSA Assay (FDA approved for NASAL specimens only), is one component of a comprehensive MRSA colonization surveillance program. It is not intended to diagnose MRSA infection nor to guide or monitor treatment for MRSA infections. Test performance is not FDA approved in patients less than 58 years old. Performed at [facility], [place] [address], [place], [place] [address]      Labs: Basic Metabolic Panel: Recent Labs  Lab 07/01/21 2138 07/02/21 0652  NA 138 138  K 4.7 4.2  CL 109 107  CO2 19* 23  GLUCOSE 182* 156*  BUN 21 20  CREATININE 1.09 1.10  CALCIUM 8.4* 8.5*   Liver Function Tests: Recent Labs  Lab 07/01/21 2138  AST 31  ALT 17  ALKPHOS 81  BILITOT 0.9  PROT 7.4  ALBUMIN 3.8   No results for input(s): LIPASE, AMYLASE in the last 168 hours. No results for input(s): AMMONIA in the last 168 hours. CBC: Recent Labs  Lab 07/01/21 2138 07/02/21 0652  WBC 12.2* 10.3  NEUTROABS 10.7*  --   HGB 11.9* 11.5*  HCT 37.6* 34.8*  MCV 99.7 96.1  PLT 193 212   Cardiac Enzymes: No results for input(s): CKTOTAL, CKMB, CKMBINDEX, TROPONINI in the last 168 hours. BNP: BNP (last 3 results) No results for input(s): BNP in the last 8760 hours.  ProBNP (last 3 results) No results for input(s): PROBNP in the last 8760 hours.  CBG: Recent Labs  Lab 07/04/21 1934 07/04/21 2333 07/05/21 0726 07/05/21 1117 07/05/21 1537  GLUCAP 168* 164* 169* 258* 91        Signed:  Domenic Polite MD.  [facility] 07/05/2021, 4:21 PM

## 2021-07-05 NOTE — Care Management Important Message (Signed)
Important Message  Patient Details  Name: Jared Bridges MRN: [id number] Date of Birth: 1930/02/18   Medicare Important Message Given:  Yes     Orbie Pyo 07/05/2021, 2:38 PM

## 2021-07-05 NOTE — H&P (Signed)
Physical Medicine and Rehabilitation Admission H&P    Chief Complaint  Patient presents with   Functional deficits due to fall with SCI/epidural hematoma.     HPI: Jared Bridges is an 87 year old male with history of T2DM, HTN, PMR, peripheral neuropathy, B-knee instability w/ falls, right bimalleolar ankle Fx  1/22, prostate cancer with mets;  who was admitted on 07/01/21 after fall with numbness in BUE with LE weakness.  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 He was transferred to [facility] for management and MRI brain/spine done revealing dorsal epidural hematoma extending from C5-T1 with severe narrowing of thecal sac and mildly compressing spinal cord with focal tear of ligamentous flavum at C6/7 and diffuse ankylosis with fracture of vertebral osteophyte at C6/7. Films evaluated by Dr. Christella Noa who recommended C-collar for support and conservative management. Patient continues to be limited by weakness with numbness BUE with tremors,neck pain and BLE weakness affecting ADLs and mobility. CIR recommended due to functional decline.    Review of Systems  Constitutional:  Negative for chills and fever.  HENT:  Positive for hearing loss (both but deaf in left ear).   Eyes:  Negative for blurred vision, pain and discharge.  Respiratory:  Positive for sputum production. Negative for cough and shortness of breath.   Cardiovascular:  Negative for chest pain, palpitations and leg swelling.  Gastrointestinal:  Negative for constipation, heartburn and nausea.  Genitourinary:  Negative for dysuria and urgency.  Musculoskeletal:  Positive for myalgias and neck pain.  Neurological:  Positive for tingling, tremors (RUE>LUE new since fall) and weakness.  Psychiatric/Behavioral:  The patient is not nervous/anxious and does not have insomnia.     Past Medical History:  Diagnosis Date   Anemia, iron  deficiency    Chronic headaches    Migraines   Chronic kidney disease    Resolved   Depression    Diverticulitis    Essential hypertension    GI bleed    Dr. Laural Golden - 1998   Hypercholesteremia    Orthostatic hypotension    Peptic ulcer disease    Peripheral neuropathy    Polymyalgia rheumatica ([name]    Stroke ([place])    Residual trouble reading and writing   Syncope    Thyroid nodule    TIA (transient ischemic attack) 12/2017   Type 2 diabetes mellitus ([place])     Past Surgical History:  Procedure Laterality Date   APPENDECTOMY     as child   BACK SURGERY     2000 IDET SPINAL PROC   CHOLECYSTECTOMY N/A 04/07/2016   Procedure: CHOLECYSTECTOMY;  Surgeon: Aviva Signs, MD;  Location: [facility];  Service: General;  Laterality: N/A;   [place]   COLONOSCOPY N/A 01/07/2018   Procedure: COLONOSCOPY;  Surgeon: Rogene Houston, MD;  Location: [facility];  Service: Endoscopy;  Laterality: N/A;  1:00   CRYOTHERAPY     EYE SURGERY     right cataract with lens implant   FEMUR SURGERY     RT LEG        1953   HAND SURGERY     RIGHT   HERNIA REPAIR     1-right inguinal, 3- left inguinal   IRRIGATION AND DEBRIDEMENT KNEE Left 07/09/2015   Procedure: LEFT KNEE IRRIGATION AND DEBRIDEMENT WOUND CLOSURE;  Surgeon: Gaynelle Arabian, MD;  Location: [facility];  Service: Orthopedics;  Laterality: Left;   JOINT REPLACEMENT  02/28/2016   lt knee revision   KNEE DEBRIDEMENT     2016   LEFT   MENISCUS REPAIR     LEFT   POLYPECTOMY  01/07/2018   Procedure: POLYPECTOMY;  Surgeon: Rogene Houston, MD;  Location: [facility];  Service: Endoscopy;;  colon    SHOULDER OPEN ROTATOR CUFF REPAIR     RT SHOULDER   SPINAL FUSION     SPINAL FUSION     TONSILLECTOMY     TOTAL KNEE ARTHROPLASTY Left 05/28/2015   Procedure: LEFT TOTAL KNEE ARTHROPLASTY;  Surgeon: Gaynelle Arabian, MD;  Location: [facility];  Service: Orthopedics;  Laterality: Left;   TOTAL KNEE REVISION Left 02/28/2016    Procedure: LEFT TOTAL KNEE REVISION;  Surgeon: Leandrew Koyanagi, MD;  Location: [facility];  Service: Orthopedics;  Laterality: Left;   TRIGGER FINGER RELEASE     RT HAND   1990S   UVULECTOMY  2004   TURBINATE,TONSIL,ADENOID    Family History  Problem Relation Age of Onset   Colon cancer Sister    Hypertension Other    Depression Brother    Depression Daughter     Social History:  Married. Wife has dementia. Retired Scientific laboratory technician.  He reports that he quit smoking about 34 years ago. His smoking use included cigarettes. He started smoking about 62 years ago. He has a 1.80 pack-year smoking history. He has never used smokeless tobacco. He reports that he does not drink alcohol and does not use drugs.   Allergies  Allergen Reactions   Iodine Hives, Itching and Other (See Comments)    very allergic(per daughter), can pre-med with benadryl   Calan [Verapamil] Other (See Comments)    Weakness   Cymbalta [Duloxetine Hcl] Other (See Comments)    Makes patient have jerking motions.   Iodinated Diagnostic Agents Hives, Itching and Other (See Comments)    Can pre-med with benadryl   Latex Other (See Comments)    Redness iritation   Lisinopril Other (See Comments)    Makes patient have jerking motions.   Neurontin [Gabapentin] Other (See Comments)    Makes patient have jerking motions.   Zoloft [Sertraline Hcl] Diarrhea and Other (See Comments)    Makes patient have jerking motions.   Valium [Diazepam] Other (See Comments)    "Did not work"   Flomax [Tamsulosin Hcl] Other (See Comments)    Dizziness   Glimepiride Other (See Comments)    "Did not work"   Melatonin Nausea Only   Tape Other (See Comments)    Skin irritation    Medications Prior to Admission  Medication Sig Dispense Refill   alfuzosin (UROXATRAL) 10 MG 24 hr tablet Take 10 mg by mouth every evening.     atorvastatin (LIPITOR) 40 MG tablet TAKE 1 TABLET BY MOUTH DAILY 90 tablet 0   Cholecalciferol (VITAMIN D3) 5000 units  CAPS Take 5,000 Units by mouth at bedtime.     Ciclopirox 0.77 % gel Apply 1 application topically 2 (two) times daily. to affected area(s)     diclofenac sodium (VOLTAREN) 1 % GEL Apply 2 g topically daily as needed (for pain). 2 g 1   enzalutamide (XTANDI) 80 MG tablet Take 80 mg by mouth daily.     escitalopram (LEXAPRO) 20 MG tablet Take 20 mg by mouth daily.     finasteride (PROSCAR) 5 MG tablet Take 1 tablet (5 mg total) by mouth daily. [place]  tablet 3   fluocinonide cream (LIDEX) [id number] % Apply 1 application topically daily as needed (for irritation).     HM PAIN RELIEF EXTRA STRENGTH 500 MG tablet Take 1,000 mg by mouth every 8 (eight) hours as needed for pain.     midodrine (PROAMATINE) 10 MG tablet Take 10 mg by mouth 3 (three) times daily.      MYRBETRIQ 50 MG TB24 tablet Take 50 mg by mouth daily.     pantoprazole (PROTONIX) 20 MG tablet TAKE 1 TABLET BY MOUTH DAILY 30 tablet 0   pregabalin (LYRICA) 75 MG capsule Take 75 mg by mouth 2 (two) times daily.     topiramate (TOPAMAX) 50 MG tablet Take 50 mg by mouth 2 (two) times daily.     venlafaxine XR (EFFEXOR-XR) 37.5 MG 24 hr capsule TAKE ONE CAPSULE BY MOUTH EVERY MORNING 30 capsule 3   vitamin B-12 (CYANOCOBALAMIN) 500 MCG tablet Take 1,000 mcg by mouth daily.     fluticasone (FLONASE) 50 MCG/ACT nasal spray Place 2 sprays into both nostrils daily. (Patient not taking: No sig reported) 16 g 0    Drug Regimen Review  Drug regimen was reviewed and remains appropriate with no significant issues identified  Home: Home Living Family/patient expects to be discharged to:: Private residence Living Arrangements: Spouse/significant other (daughter) Available Help at Discharge: Family, Available 24 hours/day Type of Home: House Home Access: Stairs to enter [employer] of Steps: 5 Entrance Stairs-Rails: Right, Left, Can reach both Home Layout: Two level, Bed/bath upstairs Alternate Level Stairs-Number of Steps: 17 Alternate  Level Stairs-Rails: Right Bathroom Shower/Tub: Chiropodist: Handicapped height Bathroom Accessibility: Yes Home Equipment: Environmental consultant - 2 wheels, Walker - 4 wheels, Shower seat Additional Comments: Patient's home environment requires extensive stair negotiation to enter home and to access primary bedroom/bathroom on 2nd floor of house  Lives With: Spouse, Daughter   Functional History: Prior Function Level of Independence: Needs assistance, Independent with assistive device(s) Gait / Transfers Assistance Needed: modified independent with rollator/RW ADL's / Homemaking Assistance Needed: daughter assisted with LB ADL and IADL tasks Communication / Swallowing Assistance Needed: HOH  Functional Status:  Mobility: Bed Mobility Overal bed mobility: Needs Assistance Bed Mobility: Rolling, Sidelying to Sit Rolling: Min assist Sidelying to sit: Mod assist Sit to sidelying: Max assist, +2 for physical assistance General bed mobility comments: Required assist for trunk and LEs. Cues for log roll technique Transfers Overall transfer level: Needs assistance Equipment used: Rolling walker (2 wheeled) Transfers: Sit to/from Stand Sit to Stand: Min assist, +2 physical assistance General transfer comment: from raised bed and recliner; VCs for safe hand placement Ambulation/Gait Ambulation/Gait assistance: Min assist, +2 safety/equipment Gait Distance (Feet): 12 Feet (+ 10 ft) Assistive device: Rolling walker (2 wheeled) Gait Pattern/deviations: Step-to pattern General Gait Details: pt with significant ER with RLE, slight hyperextension in LLE when cued to increase stride length. Gait velocity: decreased    ADL: ADL Overall ADL's : Needs assistance/impaired Eating/Feeding: Set up, Sitting Eating/Feeding Details (indicate cue type and reason): in recliner, using regular spoon for eating applesauce Grooming: Moderate assistance Upper Body Bathing: Moderate assistance Lower  Body Bathing: Maximal assistance, Sit to/from stand Upper Body Dressing : Moderate assistance Lower Body Dressing: Maximal assistance, Sit to/from stand Toilet Transfer: Minimal assistance, +2 for safety/equipment, RW, Ambulation Toilet Transfer Details (indicate cue type and reason): simulated bed>10 feet> sit in recliner>10 feet>sit in recliner Toileting- Clothing Manipulation and Hygiene: Maximal assistance Toileting - Clothing Manipulation Details (indicate cue  type and reason): wears brief; incontinence? Functional mobility during ADLs: Moderate assistance, Cueing for safety, +2 for physical assistance  Cognition: Cognition Overall Cognitive Status: Impaired/Different from baseline Orientation Level: Oriented X4 Cognition Arousal/Alertness: Awake/alert Behavior During Therapy: WFL for tasks assessed/performed Overall Cognitive Status: Impaired/Different from baseline Area of Impairment: Following commands, Safety/judgement, Problem solving Following Commands: Follows one step commands inconsistently, Follows one step commands with increased time Safety/Judgement: Decreased awareness of safety Problem Solving: Difficulty sequencing, Requires verbal cues, Requires tactile cues General Comments: Pt alert and oriented however was unaware of being at [facility]; knew he had been at [facility]   Blood pressure (!) 137/59, pulse 94, temperature 100 F (37.8 C), temperature source Oral, resp. rate (!) 24, height '6\' 2"'$  (1.88 m), weight 95 kg, SpO2 98 %. Physical Exam Vitals and nursing note reviewed.  Constitutional:      Comments: Elderly gentleman with C collar in place indicating neck pain/discomfort. Alert and appropriate. Discussed Dr. Anastasio Champion passing away.   Pulmonary:     Breath sounds: No stridor.  Musculoskeletal:     Comments: Muscle wasting left knee. Well healed old B-TKR incisions.   Neurological:     Mental Status: He is alert and oriented to person, place, and time.      Comments: Wet voice but able to clear secretions. No coughing. Intention tremors RUE>LUE with weak grips and left triceps weakness.     Results for orders placed or performed during the hospital encounter of 07/01/21 (from the past 48 hour(s))  Glucose, capillary     Status: Abnormal   Collection Time: 07/03/21  3:48 PM  Result Value Ref Range   Glucose-Capillary 199 (H) 70 - 99 mg/dL    Comment: Glucose reference range applies only to samples taken after fasting for at least 8 hours.  Glucose, capillary     Status: Abnormal   Collection Time: 07/03/21 10:58 PM  Result Value Ref Range   Glucose-Capillary 174 (H) 70 - 99 mg/dL    Comment: Glucose reference range applies only to samples taken after fasting for at least 8 hours.  Glucose, capillary     Status: Abnormal   Collection Time: 07/04/21  7:59 AM  Result Value Ref Range   Glucose-Capillary 161 (H) 70 - 99 mg/dL    Comment: Glucose reference range applies only to samples taken after fasting for at least 8 hours.  Glucose, capillary     Status: Abnormal   Collection Time: 07/04/21 11:54 AM  Result Value Ref Range   Glucose-Capillary 256 (H) 70 - 99 mg/dL    Comment: Glucose reference range applies only to samples taken after fasting for at least 8 hours.  Glucose, capillary     Status: Abnormal   Collection Time: 07/04/21  4:28 PM  Result Value Ref Range   Glucose-Capillary 123 (H) 70 - 99 mg/dL    Comment: Glucose reference range applies only to samples taken after fasting for at least 8 hours.  Glucose, capillary     Status: Abnormal   Collection Time: 07/04/21  7:34 PM  Result Value Ref Range   Glucose-Capillary 168 (H) 70 - 99 mg/dL    Comment: Glucose reference range applies only to samples taken after fasting for at least 8 hours.   Comment 1 Notify RN    Comment 2 Document in Chart   Glucose, capillary     Status: Abnormal   Collection Time: 07/04/21 11:33 PM  Result Value Ref Range   Glucose-Capillary 164 (H)  70 -  99 mg/dL    Comment: Glucose reference range applies only to samples taken after fasting for at least 8 hours.   Comment 1 Notify RN    Comment 2 Document in Chart   Glucose, capillary     Status: Abnormal   Collection Time: 07/05/21  7:26 AM  Result Value Ref Range   Glucose-Capillary 169 (H) 70 - 99 mg/dL    Comment: Glucose reference range applies only to samples taken after fasting for at least 8 hours.  Glucose, capillary     Status: Abnormal   Collection Time: 07/05/21 11:17 AM  Result Value Ref Range   Glucose-Capillary 258 (H) 70 - 99 mg/dL    Comment: Glucose reference range applies only to samples taken after fasting for at least 8 hours.   No results found.     Medical Problem List and Plan: 1.  Functional and mobility deficits secondary to fall with C6/7 fx and EDH and subsequent cervical cord compression at that level  -patient may not yet shower  -ELOS/Goals: 10-12 days, supervision to min assist 2.  Antithrombotics: -DVT/anticoagulation:  Mechanical: Sequential compression devices, below knee Bilateral lower extremities  -antiplatelet therapy: N/A 3. Pain Management: Continue tylenol TID with Oxycodone prn 4. Mood: LCSW to follow for evaluation and support/   -antipsychotic agents: N/A 5. Neuropsych: This patient is capable of making decisions on his own behalf. 6. Skin/Wound Care: routine pressure relief measures.  7. Fluids/Electrolytes/Nutrition: Monitor I/O. Check BMET on 08/08 8. HTN: Monitor BP tid 9. T2DM: Monitor BS ac/hs. Use SSI for elevated BS 10. PMR with peripheral neuropathy: ON Topamax, Lyrica,  11. Prostate cancer w/bony mets:  Treated with XRT 01/2021 at [facility] --On Xandi with proscar 12. Wet voice: Will order CXR  due to oral secretions/wet voice and at risk for aspiration.   -his injury and edema in the setting of his ankylosis and osteophytes are certainly contributing 13. H/o depression: ON Effexor.       Bary Leriche, PA-C 07/05/2021

## 2021-07-05 NOTE — PMR Pre-admission (Signed)
PMR Admission Coordinator Pre-Admission Assessment  Patient: Jared Bridges is an 87 y.o., male MRN: [id number] DOB: 1930/05/13 Height: '6\' 2"'  (188 cm) Weight: 95 kg yes Insurance Information HMO:     PPO:      PCP:      IPA:      80/20:      OTHER:  PRIMARY: [employer] Medicare      Policy#: [id number]      Subscriber: pt CM Name: Randa Spike     Phone#:  [phone] OPTION 7     Fax#: [phone] Pre-Cert#: [id number]  approved for 7 days    Employer:  Benefits:  Phone #: [phone]     Name: 8/3 Eff. Date: 12/01/2020     Deduct: none      Out of Pocket Max: $3600      Life Max: none CIR: $295 co pay per days days 1 until 5      SNF: no copay days 1 until 20; $188 co pay per day days 21 until 40; no copay days 41 until 100 Outpatient: $30 per visit     Co-Pay: visits per medical neccesity Home Health: 100%      Co-Pay: visits per medical neccesity DME: 80%     Co-Pay: 20% Providers: in network  SECONDARY: none  Financial Counselor:       Phone#:   The Engineer, petroleum" for patients in Inpatient Rehabilitation Facilities with attached "Privacy Act [place] Records" was provided and verbally reviewed with: Patient and Family  Emergency Contact Information Contact Information     Name Relation Home Work [place] Daughter   [phone]       Current Medical History  Patient Admitting Diagnosis: Fall, C6/C7 Fx with epidural hematoma  History of Present Illness: 87 year old male with history of prostate cancer, bony mets, Type 2 DM, HTN, Dyslipidemia, and HOH. Present via [facility] on 07/01/2021 due to mechanical fall. Had been to ED in past 24 hrs and discharged home, but returned due to upper extremity weakness numbness and pain. Transferred to [facility] for neurosurgery evaluation.  Dr Christella Noa recommended non operative management, Aspen collar, and therapy. Type 2 DM stable on sliding scale insulin. Continue Xtandi for metastatic prostate  cancer and Proscar for BPH. DVT prophylaxis with SCDs.   Patient's medical record from [facility]  has been reviewed by the rehabilitation admission coordinator and physician.  Past Medical History  Past Medical History:  Diagnosis Date   Anemia, iron deficiency    Chronic headaches    Migraines   Chronic kidney disease    Resolved   Depression    Diverticulitis    Essential hypertension    GI bleed    Dr. Laural Golden - 1998   Hypercholesteremia    Orthostatic hypotension    Peptic ulcer disease    Peripheral neuropathy    Polymyalgia rheumatica ([name]    Stroke ([name]    Residual trouble reading and writing   Syncope    Thyroid nodule    TIA (transient ischemic attack) 12/2017   Type 2 diabetes mellitus ([place])     Family History   family history includes Colon cancer in his sister; Depression in his brother and daughter; Hypertension in an other family member.  Prior Rehab/Hospitalizations Has the patient had prior rehab or hospitalizations prior to admission? Yes  Has the patient had major surgery during 100 days prior to admission? No   Current Medications  Current  Facility-Administered Medications:    acetaminophen (TYLENOL) tablet 650 mg, 650 mg, Oral, TID, Domenic Polite, MD, 650 mg at 07/05/21 1026   alfuzosin (UROXATRAL) 24 hr tablet 10 mg, 10 mg, Oral, QPM, Hall, Carole N, DO, 10 mg at 07/04/21 1700   atorvastatin (LIPITOR) tablet 40 mg, 40 mg, Oral, Daily, Hall, Carole N, DO, 40 mg at 07/05/21 1026   cholecalciferol (VITAMIN D3) tablet 5,000 Units, 5,000 Units, Oral, QHS, Hall, Carole N, DO, 5,000 Units at 07/04/21 2142   enzalutamide (XTANDI) tablet 80 mg, 80 mg, Oral, QPM, Domenic Polite, MD, 80 mg at 07/04/21 2053   finasteride (PROSCAR) tablet 5 mg, 5 mg, Oral, Daily, Hall, Carole N, DO, 5 mg at 07/05/21 1026   insulin aspart (novoLOG) injection 0-9 Units, 0-9 Units, Subcutaneous, TID WC, Domenic Polite, MD, 5 Units at 07/05/21 1238   ondansetron  [facility]) injection 4 mg, 4 mg, Intravenous, Q6H PRN, Domenic Polite, MD   oxyCODONE (Oxy IR/ROXICODONE) immediate release tablet 5 mg, 5 mg, Oral, Q6H PRN, Domenic Polite, MD, 5 mg at 07/04/21 1243   pantoprazole (PROTONIX) EC tablet 20 mg, 20 mg, Oral, Daily, Hall, Carole N, DO, 20 mg at 07/05/21 1026   pregabalin (LYRICA) capsule 75 mg, 75 mg, Oral, BID, Hall, Carole N, DO, 75 mg at 07/05/21 1026   senna-docusate (Senokot-S) tablet 1 tablet, 1 tablet, Oral, BID, Domenic Polite, MD, 1 tablet at 07/05/21 1026   topiramate (TOPAMAX) tablet 50 mg, 50 mg, Oral, BID, Hall, Carole N, DO, 50 mg at 07/05/21 1026   venlafaxine XR (EFFEXOR-XR) 24 hr capsule 37.5 mg, 37.5 mg, Oral, q morning, Hall, Carole N, DO, 37.5 mg at 07/05/21 1026   vitamin B-12 (CYANOCOBALAMIN) tablet 1,000 mcg, 1,000 mcg, Oral, Daily, Hall, Carole N, DO, 1,000 mcg at 07/05/21 1026  Patients Current Diet:  Diet Order             Diet regular Room service appropriate? Yes with Assist; Fluid consistency: Thin  Diet effective now                  Precautions / Restrictions Precautions Precautions: Fall, Cervical Precaution Booklet Issued: No Cervical Brace: Hard collar, At all times Restrictions Weight Bearing Restrictions: No   Has the patient had 2 or more falls or a fall with injury in the past year? Yes  Prior Activity Level Limited Community (1-2x/wk): Mod I with Rollator; receiving Outpatient Therapy  Prior Functional Level Self Care: Did the patient need help bathing, dressing, using the toilet or eating? Needed some help  Indoor Mobility: Did the patient need assistance with walking from room to room (with or without device)? Independent  Stairs: Did the patient need assistance with internal or external stairs (with or without device)? Needed some help  Functional Cognition: Did the patient need help planning regular tasks such as shopping or remembering to take medications? Independent  Home Assistive  Devices / Equipment Home Assistive Devices/Equipment: None Home Equipment: Walker - 2 wheels, Walker - 4 wheels, Shower seat  Prior Device Use: Indicate devices/aids used by the patient prior to current illness, exacerbation or injury?  rollator  Current Functional Level Cognition  Overall Cognitive Status: Impaired/Different from baseline Orientation Level: Oriented X4 Following Commands: Follows one step commands inconsistently, Follows one step commands with increased time Safety/Judgement: Decreased awareness of safety General Comments: Pt alert and oriented however was unaware of being at [facility]; knew he had been at [facility]    Extremity Assessment (includes Sensation/Coordination)  Upper Extremity Assessment: Defer to OT evaluation RUE Deficits / Details: R hand numbness; limited shoulder ROM; interferring with function; will further assess LUE Deficits / Details: L hand numbness; limited shoulder ROM; interferring with function  Lower Extremity Assessment: Generalized weakness, RLE deficits/detail, LLE deficits/detail RLE Deficits / Details: Reporting numbness from feet to knee; but reports this has been going on for awhile LLE Deficits / Details: Reporting numbness from feet to knee, but reports this has been going on for a long time    ADLs  Overall ADL's : Needs assistance/impaired Eating/Feeding: Set up, Sitting Eating/Feeding Details (indicate cue type and reason): in recliner, using regular spoon for eating applesauce Grooming: Moderate assistance Upper Body Bathing: Moderate assistance Lower Body Bathing: Maximal assistance, Sit to/from stand Upper Body Dressing : Moderate assistance Lower Body Dressing: Maximal assistance, Sit to/from stand Toilet Transfer: Minimal assistance, +2 for safety/equipment, RW, Ambulation Toilet Transfer Details (indicate cue type and reason): simulated bed>10 feet> sit in recliner>10 feet>sit in recliner Toileting- Clothing Manipulation and  Hygiene: Maximal assistance Toileting - Clothing Manipulation Details (indicate cue type and reason): wears brief; incontinence? Functional mobility during ADLs: Moderate assistance, Cueing for safety, +2 for physical assistance    Mobility  Overal bed mobility: Needs Assistance Bed Mobility: Rolling, Sidelying to Sit Rolling: Min assist Sidelying to sit: Mod assist Sit to sidelying: Max assist, +2 for physical assistance General bed mobility comments: Required assist for trunk and LEs. Cues for log roll technique    Transfers  Overall transfer level: Needs assistance Equipment used: Rolling walker (2 wheeled) Transfers: Sit to/from Stand Sit to Stand: Min assist, +2 physical assistance General transfer comment: from raised bed and recliner; VCs for safe hand placement    Ambulation / Gait / Stairs / Wheelchair Mobility  Ambulation/Gait Ambulation/Gait assistance: Min assist, +2 safety/equipment Gait Distance (Feet): 12 Feet (+ 10 ft) Assistive device: Rolling walker (2 wheeled) Gait Pattern/deviations: Step-to pattern General Gait Details: pt with significant ER with RLE, slight hyperextension in LLE when cued to increase stride length. Gait velocity: decreased    Posture / Balance Balance Overall balance assessment: Needs assistance Sitting-balance support: Feet supported, Bilateral upper extremity supported Sitting balance-Leahy Scale: Poor Standing balance support: Bilateral upper extremity supported Standing balance-Leahy Scale: Poor Standing balance comment: Reliant on external support and RW    Special needs/care consideration Fall precautions   Previous Home Environment  Living Arrangements: Spouse/significant other (daughter)  Lives With: Spouse, Daughter Available Help at Discharge: Family, Available 24 hours/day Type of Home: House Home Layout: Two level, Bed/bath upstairs Alternate Level Stairs-Rails: Right Alternate Level Stairs-Number of Steps: 17 Home  Access: Stairs to enter Entrance Stairs-Rails: Right, Left, Can reach both Entrance Stairs-Number of Steps: 5 Bathroom Shower/Tub: Chiropodist: Handicapped height Bathroom Accessibility: Yes How Accessible: Accessible via walker Home Care Services:  (was receiving outpatient therapy) Additional Comments: Patient's home environment requires extensive stair negotiation to enter home and to access primary bedroom/bathroom on 2nd floor of house  Discharge Living Setting Plans for Discharge Living Setting: Patient's home, Lives with (comment) (wife and daughter) Type of Home at Discharge: House Discharge Home Layout: Two level, Bed/bath upstairs Alternate Level Stairs-Rails: Right Alternate Level Stairs-Number of Steps: 17 Discharge Home Access: Stairs to enter Entrance Stairs-Rails: Right, Left, Can reach both Entrance Stairs-Number of Steps: 5 Discharge Bathroom Shower/Tub: Tub/shower unit Discharge Bathroom Toilet: Handicapped height Discharge Bathroom Accessibility: Yes How Accessible: Accessible via walker Does the patient have any problems obtaining your medications?: No  Social/Family/Support Systems Patient Roles: Spouse, Parent Contact Information: Daughter, [name] Anticipated Caregiver: wife and daughter Anticipated Caregiver's Contact Information: see above Ability/Limitations of Caregiver: daughter is a non practicing MD Caregiver Availability: 24/7 Discharge Plan Discussed with Primary Caregiver: Yes Is Caregiver In Agreement with Plan?: Yes Does Caregiver/Family have Issues with Lodging/Transportation while Pt is in Rehab?: No  Goals Patient/Family Goal for Rehab: supervision to min assist with PT and OT Expected length of stay: ELOS 10 to 12 days Pt/Family Agrees to Admission and willing to participate: Yes Program Orientation Provided & Reviewed with Pt/Caregiver Including Roles  & Responsibilities: Yes  Decrease burden of Care through IP rehab  admission: n/a  Possible need for SNF placement upon discharge: not anticipated  Patient Condition: I have reviewed medical records from [facility] , spoken with CM, and patient and daughter. I met with patient at the bedside for inpatient rehabilitation assessment.  Patient will benefit from ongoing PT and OT, can actively participate in 3 hours of therapy a day 5 days of the week, and can make measurable gains during the admission.  Patient will also benefit from the coordinated team approach during an Inpatient Acute Rehabilitation admission.  The patient will receive intensive therapy as well as Rehabilitation physician, nursing, social worker, and care management interventions.  Due to bladder management, bowel management, safety, skin/wound care, disease management, medication administration, pain management, and patient education the patient requires 24 hour a day rehabilitation nursing.  The patient is currently min to mod assist overall with mobility and basic ADLs.  Discharge setting and therapy post discharge at home with home health is anticipated.  Patient has agreed to participate in the Acute Inpatient Rehabilitation Program and will admit today.  Preadmission Screen Completed By: Danne Baxter with updates by  Cleatrice Burke, 07/05/2021 4:21 PM ______________________________________________________________________   Discussed status with Dr. Naaman Plummer on 07/05/2021 at 1620 and received approval for admission today.  Admission Coordinator: Danne Baxter RN MSN Cleatrice Burke, [place], time 1586 Date 07/05/2021   Assessment/Plan: Diagnosis: c6,7 fx's with edh after fall.  Does the need for close, 24 hr/day Medical supervision in concert with the patient's rehab needs make it unreasonable for this patient to be served in a less intensive setting? Yes Co-Morbidities requiring supervision/potential complications: prostate cancer with mets, dm, htn Due to bladder  management, bowel management, safety, skin/wound care, disease management, medication administration, pain management, and patient education, does the patient require 24 hr/day rehab nursing? Yes Does the patient require coordinated care of a physician, rehab nurse, PT, OT to address physical and functional deficits in the context of the above medical diagnosis(es)? Yes Addressing deficits in the following areas: balance, endurance, locomotion, strength, transferring, bowel/bladder control, bathing, dressing, feeding, grooming, toileting, and psychosocial support Can the patient actively participate in an intensive therapy program of at least 3 hrs of therapy 5 days a week? Yes The potential for patient to make measurable gains while on inpatient rehab is excellent Anticipated functional outcomes upon discharge from inpatient rehab: supervision and min assist PT, supervision and min assist OT, n/a SLP Estimated rehab length of stay to reach the above functional goals is: 10-12 days Anticipated discharge destination: Home 10. Overall Rehab/Functional Prognosis: excellent   MD Signature: Meredith Staggers, MD, [place] [facility] 07/05/2021

## 2021-07-05 NOTE — Progress Notes (Signed)
Inpatient Rehabilitation Admissions Coordinator   I continue to wait for CIR approval for a possible Cir admit.  Danne Baxter, RN, MSN Rehab Admissions Coordinator [phone] 07/05/2021 11:39 AM

## 2021-07-05 NOTE — Progress Notes (Signed)
PROGRESS NOTE    Jared Bridges  [id number] DOB: 10/27/1930 DOA: 07/01/2021 PCP: Doree Albee, MD  Brief Narrative: 87 year old male with history of prostate cancer, bony mets, type 2 diabetes mellitus, hypertension, dyslipidemia, hearing impairment presented to [name] ED after mechanical fall, slipping on soap, was deemed stable discharged home return to ED the same day due to upper extremity numbness and some pain, repeat CT C-spine was abnormal subsequently transferred to [facility] for MRI. Neurosurgery recommendations  Assessment & Plan:   C 6/C7 fracture/mechanical fall Small epidural hematoma -EDP discussed with neurosurgery Dr. Christella Noa, recommended nonoperative management, Aspen collar, PT OT, conservative management -Physical therapy evaluation completed, CIR consulted -Discharge to CIR for rehabilitation -Follow-up with neurosurgery in 3 to 4 weeks  Type 2 diabetes mellitus -CBGs are stable, sliding scale insulin  Chronic diastolic CHF -Clinically euvolemic  Metastatic prostate cancer -Continue Xtandi  BPH -Continue Proscar  Generalized weakness/fall -Physical therapy  Home meds -Resumed Lexapro, Protonix, Lyrica, Topamax, Effexor  DVT prophylaxis: SCDs Code Status: DNR Family Communication: Discussed with patient in detail, no family at bedside Disposition Plan:  Status is: Inpatient  Remains inpatient appropriate because:Inpatient level of care appropriate due to severity of illness  Dispo:  Patient From: Home  Planned Disposition: Awaiting CIR              medically stable for discharge: Yes     Consultants:  Discussed with neurosurgery Dr. Christella Noa on admission  Procedures:   Antimicrobials:    Subjective: -Has intermittent neck pain but doing okay overall, ate breakfast this morning  Objective: Vitals:   07/05/21 0140 07/05/21 0321 07/05/21 0729 07/05/21 1114  BP: (!) 114/54 123/62 136/89 (!) 137/59  Pulse: 81 95 86 94  Resp:  '18 20 19 '$ (!) 24  Temp:  98.5 F (36.9 C) 99.2 F (37.3 C) 100 F (37.8 C)  TempSrc:  Oral Oral Oral  SpO2: 97% 97% 97% 98%  Weight:      Height:        Intake/Output Summary (Last 24 hours) at 07/05/2021 1128 Last data filed at 07/05/2021 0941 Gross per 24 hour  Intake 872 ml  Output --  Net 872 ml   Filed Weights   07/01/21 1952  Weight: 95 kg    Examination:  General exam: Elderly pleasant male, laying in bed, AAOx3, no distress HEENT: Aspen cervical collar on CVS: S1-S2, regular rate rhythm Lungs: Clear anteriorly Abdomen: Soft, nontender, bowel sounds present Extremities: No edema  Skin: As above Psychiatry:  Mood & affect appropriate.     Data Reviewed:   CBC: Recent Labs  Lab 07/01/21 2138 07/02/21 0652  WBC 12.2* 10.3  NEUTROABS 10.7*  --   HGB 11.9* 11.5*  HCT 37.6* 34.8*  MCV 99.7 96.1  PLT 193 [id number]   Basic Metabolic Panel: Recent Labs  Lab 07/01/21 2138 07/02/21 0652  NA 138 138  K 4.7 4.2  CL 109 107  CO2 19* 23  GLUCOSE 182* 156*  BUN 21 20  CREATININE 1.09 1.10  CALCIUM 8.4* 8.5*   GFR: Estimated Creatinine Clearance: 52.9 mL/min (by C-G formula based on SCr of 1.1 mg/dL). Liver Function Tests: Recent Labs  Lab 07/01/21 2138  AST 31  ALT 17  ALKPHOS 81  BILITOT 0.9  PROT 7.4  ALBUMIN 3.8   No results for input(s): LIPASE, AMYLASE in the last 168 hours. No results for input(s): AMMONIA in the last 168 hours. Coagulation Profile: Recent Labs  Lab 07/01/21  2138  INR 1.1   Cardiac Enzymes: No results for input(s): CKTOTAL, CKMB, CKMBINDEX, TROPONINI in the last 168 hours. BNP (last 3 results) No results for input(s): PROBNP in the last 8760 hours. HbA1C: No results for input(s): HGBA1C in the last 72 hours. CBG: Recent Labs  Lab 07/04/21 1628 07/04/21 1934 07/04/21 2333 07/05/21 0726 07/05/21 1117  GLUCAP 123* 168* 164* 169* 258*   Lipid Profile: No results for input(s): CHOL, HDL, LDLCALC, TRIG, CHOLHDL,  LDLDIRECT in the last 72 hours. Thyroid Function Tests: No results for input(s): TSH, T4TOTAL, FREET4, T3FREE, THYROIDAB in the last 72 hours.  Anemia Panel: No results for input(s): VITAMINB12, FOLATE, FERRITIN, TIBC, IRON, RETICCTPCT in the last 72 hours. Urine analysis:    Component Value Date/Time   COLORURINE YELLOW 09/04/2020 1136   APPEARANCEUR HAZY (A) 09/04/2020 1136   LABSPEC 1.016 09/04/2020 1136   PHURINE 7.0 09/04/2020 1136   GLUCOSEU NEGATIVE 09/04/2020 1136   HGBUR NEGATIVE 09/04/2020 [place] 09/04/2020 1136   BILIRUBINUR negative 04/19/2019 [place] 09/04/2020 1136   PROTEINUR NEGATIVE 09/04/2020 1136   UROBILINOGEN 0.2 04/19/2019 1354   UROBILINOGEN 1.0 05/21/2015 1050   NITRITE NEGATIVE 09/04/2020 1136   LEUKOCYTESUR NEGATIVE 09/04/2020 1136   Sepsis Labs: '@LABRCNTIP'$ (procalcitonin:4,lacticidven:4)  ) Recent Results (from the past 240 hour(s))  Resp Panel by RT-PCR (Flu A&B, Covid) Nasopharyngeal Swab     Status: None   Collection Time: 07/01/21  8:51 PM   Specimen: Nasopharyngeal Swab; Nasopharyngeal(NP) swabs in vial transport medium  Result Value Ref Range Status   SARS Coronavirus 2 by RT PCR NEGATIVE NEGATIVE Final    Comment: (NOTE) SARS-CoV-2 target nucleic acids are NOT DETECTED.  The SARS-CoV-2 RNA is generally detectable in upper respiratory specimens during the acute phase of infection. The lowest concentration of SARS-CoV-2 viral copies this assay can detect is 138 copies/mL. A negative result does not preclude SARS-Cov-2 infection and should not be used as the sole basis for treatment or other patient management decisions. A negative result may occur with  improper specimen collection/handling, submission of specimen other than nasopharyngeal swab, presence of viral mutation(s) within the areas targeted by this assay, and inadequate number of viral copies(<138 copies/mL). A negative result must be  combined with clinical observations, patient history, and epidemiological information. The expected result is Negative.  Fact Sheet for Patients:  EntrepreneurPulse.com.au  Fact Sheet for Healthcare Providers:  IncredibleEmployment.be  This test is no t yet approved or cleared by the [place] FDA and  has been authorized for detection and/or diagnosis of SARS-CoV-2 by FDA under an Emergency Use Authorization (EUA). This EUA will remain  in effect (meaning this test can be used) for the duration of the COVID-19 declaration under Section 564(b)(1) of the Act, 21 U.S.C.section 360bbb-3(b)(1), unless the authorization is terminated  or revoked sooner.       Influenza A by PCR NEGATIVE NEGATIVE Final   Influenza B by PCR NEGATIVE NEGATIVE Final    Comment: (NOTE) The Xpert Xpress SARS-CoV-2/FLU/RSV plus assay is intended as an aid in the diagnosis of influenza from Nasopharyngeal swab specimens and should not be used as a sole basis for treatment. Nasal washings and aspirates are unacceptable for Xpert Xpress SARS-CoV-2/FLU/RSV testing.  Fact Sheet for Patients: EntrepreneurPulse.com.au  Fact Sheet for Healthcare Providers: IncredibleEmployment.be  This test is not yet approved or cleared by the [place] FDA and has been authorized for detection and/or diagnosis of SARS-CoV-2 by FDA under  an Emergency Use Authorization (EUA). This EUA will remain in effect (meaning this test can be used) for the duration of the COVID-19 declaration under Section 564(b)(1) of the Act, 21 U.S.C. section 360bbb-3(b)(1), unless the authorization is terminated or revoked.  Performed at [facility], [address] [place], [place] [address]   MRSA Next Gen by PCR, Nasal     Status: None   Collection Time: 07/02/21  2:18 PM   Specimen: Nasal Mucosa; Nasal Swab  Result Value Ref Range Status   MRSA by PCR Next Gen NOT  DETECTED NOT DETECTED Final    Comment: (NOTE) The GeneXpert MRSA Assay (FDA approved for NASAL specimens only), is one component of a comprehensive MRSA colonization surveillance program. It is not intended to diagnose MRSA infection nor to guide or monitor treatment for MRSA infections. Test performance is not FDA approved in patients less than 39 years old. Performed at [facility], [place] [address], [place], [place] [address]     Radiology Studies: No results found.   Scheduled Meds:  acetaminophen  650 mg Oral TID   alfuzosin  10 mg Oral QPM   atorvastatin  40 mg Oral Daily   cholecalciferol  5,000 Units Oral QHS   enzalutamide  80 mg Oral QPM   finasteride  5 mg Oral Daily   insulin aspart  0-9 Units Subcutaneous TID WC   pantoprazole  20 mg Oral Daily   pregabalin  75 mg Oral BID   senna-docusate  1 tablet Oral BID   topiramate  50 mg Oral BID   venlafaxine XR  37.5 mg Oral q morning   vitamin B-12  1,000 mcg Oral Daily   Continuous Infusions:   LOS: 3 days   Time spent: 61mn  PDomenic Polite MD [facility]   07/05/2021, 11:28 AM

## 2021-07-05 NOTE — Progress Notes (Signed)
Inpatient Rehabilitation Admissions Coordinator  I have received approval for Cir admit. I met with patient at bedside and spoke with his daughter, [name], by phone. They are in agreement to admit . I have alerted acute team and TOC and will make the arrangements to admit today.  Danne Baxter, RN, MSN Rehab Admissions Coordinator [phone] 07/05/2021 4:36 PM

## 2021-07-05 NOTE — Progress Notes (Signed)
Pt to be discharged to CIR. Order entered late in the day. This RN on dayshift called report to [place], nightshift RN on 4W. Pt will be transferred by nightshift. Room belongings all packed.   Justice Rocher, RN

## 2021-07-06 ENCOUNTER — Inpatient Hospital Stay (HOSPITAL_COMMUNITY): Payer: Medicare Other

## 2021-07-06 ENCOUNTER — Other Ambulatory Visit: Payer: Self-pay

## 2021-07-06 DIAGNOSIS — E119 Type 2 diabetes mellitus without complications: Secondary | ICD-10-CM | POA: Diagnosis not present

## 2021-07-06 DIAGNOSIS — K5901 Slow transit constipation: Secondary | ICD-10-CM

## 2021-07-06 DIAGNOSIS — M353 Polymyalgia rheumatica: Secondary | ICD-10-CM

## 2021-07-06 DIAGNOSIS — S14127S Central cord syndrome at C7 level of cervical spinal cord, sequela: Secondary | ICD-10-CM

## 2021-07-06 LAB — GLUCOSE, CAPILLARY
Glucose-Capillary: 156 mg/dL — ABNORMAL HIGH (ref 70–99)
Glucose-Capillary: 218 mg/dL — ABNORMAL HIGH (ref 70–99)
Glucose-Capillary: 197 mg/dL — ABNORMAL HIGH (ref 70–99)
Glucose-Capillary: 214 mg/dL — ABNORMAL HIGH (ref 70–99)

## 2021-07-06 MED ORDER — POLYETHYLENE GLYCOL 3350 17 G PO PACK
17.0000 g | PACK | Freq: Every day | ORAL | Status: DC
  Administered 2021-07-07 – 2021-07-10 (×5): 17 g via ORAL
  Filled 2021-07-06 (×4): qty 1

## 2021-07-06 NOTE — Evaluation (Signed)
Physical Therapy Assessment and Plan  Patient Details  Name: Jared Bridges MRN: [id number] Date of Birth: Mar 02, 1930  PT Diagnosis: Abnormal posture, Difficulty walking, Dizziness and giddiness, Muscle weakness, and Pain in /radiating from cervical spine Rehab Potential: Fair ELOS: 10-14 days   Today's Date: 07/06/2021 PT Individual Time: 0906-1001 PT Individual Time Calculation (min): 55 min    Hospital Problem: Active Problems:   C6 cervical fracture [facility])   Past Medical History:  Past Medical History:  Diagnosis Date   Anemia, iron deficiency    Chronic headaches    Migraines   Chronic kidney disease    Resolved   Depression    Diverticulitis    Essential hypertension    GI bleed    Dr. Laural Golden - 1998   Hypercholesteremia    Orthostatic hypotension    Peptic ulcer disease    Peripheral neuropathy    Polymyalgia rheumatica ([name]    Stroke ([place])    Residual trouble reading and writing   Syncope    Thyroid nodule    TIA (transient ischemic attack) 12/2017   Type 2 diabetes mellitus ([place])    Past Surgical History:  Past Surgical History:  Procedure Laterality Date   APPENDECTOMY     as child   BACK SURGERY     2000 IDET SPINAL PROC   CHOLECYSTECTOMY N/A 04/07/2016   Procedure: CHOLECYSTECTOMY;  Surgeon: Aviva Signs, MD;  Location: [facility];  Service: General;  Laterality: N/A;   [place]   COLONOSCOPY N/A 01/07/2018   Procedure: COLONOSCOPY;  Surgeon: Rogene Houston, MD;  Location: [facility];  Service: Endoscopy;  Laterality: N/A;  1:00   CRYOTHERAPY     EYE SURGERY     right cataract with lens implant   FEMUR SURGERY     RT LEG        1953   HAND SURGERY     RIGHT   HERNIA REPAIR     1-right inguinal, 3- left inguinal   IRRIGATION AND DEBRIDEMENT KNEE Left 07/09/2015   Procedure: LEFT KNEE IRRIGATION AND DEBRIDEMENT WOUND CLOSURE;  Surgeon: Gaynelle Arabian, MD;  Location: [facility];  Service: Orthopedics;  Laterality: Left;    JOINT REPLACEMENT  02/28/2016   lt knee revision   KNEE DEBRIDEMENT     2016   LEFT   MENISCUS REPAIR     LEFT   POLYPECTOMY  01/07/2018   Procedure: POLYPECTOMY;  Surgeon: Rogene Houston, MD;  Location: [facility];  Service: Endoscopy;;  colon    SHOULDER OPEN ROTATOR CUFF REPAIR     RT SHOULDER   SPINAL FUSION     SPINAL FUSION     TONSILLECTOMY     TOTAL KNEE ARTHROPLASTY Left 05/28/2015   Procedure: LEFT TOTAL KNEE ARTHROPLASTY;  Surgeon: Gaynelle Arabian, MD;  Location: [facility];  Service: Orthopedics;  Laterality: Left;   TOTAL KNEE REVISION Left 02/28/2016   Procedure: LEFT TOTAL KNEE REVISION;  Surgeon: Leandrew Koyanagi, MD;  Location: [place];  Service: Orthopedics;  Laterality: Left;   TRIGGER FINGER RELEASE     RT HAND   1990S   UVULECTOMY  2004   TURBINATE,TONSIL,ADENOID    Assessment & Plan Clinical Impression: Patient is a 87 y.o. year old male with history of T2DM, HTN, PMR, peripheral neuropathy, B-knee instability w/ falls, right bimalleolar ankle Fx  1/22, prostate cancer with mets;  who was admitted on 07/01/21 after fall  with numbness in BUE with LE weakness.  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 He was transferred to [facility] for management and MRI brain/spine done revealing dorsal epidural hematoma extending from C5-T1 with severe narrowing of thecal sac and mildly compressing spinal cord with focal tear of ligamentous flavum at C6/7 and diffuse ankylosis with fracture of vertebral osteophyte at C6/7. Films evaluated by Dr. Christella Noa who recommended C-collar for support and conservative management. Patient continues to be limited by weakness with numbness BUE with tremors,neck pain and BLE weakness affecting ADLs and mobility. CIR recommended due to functional decline.  Patient transferred to CIR on 07/05/2021 .   Patient currently requires mod with mobility secondary to muscle  weakness, decreased cardiorespiratoy endurance, radiating pain from cervical spine, decreased safety awareness, and decreased standing balance, decreased postural control, and difficulty maintaining precautions.  Prior to hospitalization, patient was modified independent  with mobility and lived with Spouse, Daughter (Wife has dementia.) in a House home.  Home access is 5Stairs to enter.  Patient will benefit from skilled PT intervention to maximize safe functional mobility, minimize fall risk, and decrease caregiver burden for planned discharge home with 24 hour supervision.  Anticipate patient will benefit from follow up [place] at discharge.  PT - End of Session Activity Tolerance: Tolerates 10 - 20 min activity with multiple rests Endurance Deficit: Yes Endurance Deficit Description: Limited d/t pain as well as expectation for pain with movement PT Assessment Rehab Potential (ACUTE/IP ONLY): Fair PT Barriers to Discharge: [place] home environment;Decreased caregiver support;Home environment access/layout;Lack of/limited family support;Insurance for SNF coverage;Other (comments) (C-collar and movement restrictions) PT Patient demonstrates impairments in the following area(s): Balance;Endurance;Motor;Pain;Safety;Sensory PT Transfers Functional Problem(s): Bed Mobility;Bed to Chair;Car;Furniture PT Locomotion Functional Problem(s): Ambulation;Stairs PT Plan PT Intensity: Minimum of 1-2 x/day ,45 to 90 minutes PT Frequency: 5 out of 7 days PT Duration Estimated Length of Stay: 10-14 days PT Treatment/Interventions: Ambulation/gait training;Balance/vestibular training;Discharge planning;Community reintegration;Disease management/prevention;DME/adaptive equipment instruction;Functional mobility training;Neuromuscular re-education;Pain management;Psychosocial support;Therapeutic Exercise;Therapeutic Activities;UE/LE Coordination activities;UE/LE Strength taining/ROM;Wheelchair  propulsion/positioning;Stair training;Patient/family education PT Transfers Anticipated Outcome(s): supervision/ CGA PT Locomotion Anticipated Outcome(s): CGA PT Recommendation Recommendations for Other Services: Therapeutic Recreation consult Therapeutic Recreation Interventions: Stress management;Outing/community reintergration Follow Up Recommendations: Home health PT Patient destination: Home Equipment Recommended: To be determined   PT Evaluation Precautions/Restrictions Precautions Precautions: Cervical;Fall Required Braces or Orthoses: Cervical Brace Cervical Brace: Hard collar;At all times Restrictions Weight Bearing Restrictions: No General   Vital Signs Pain Pain Assessment Pain Scale: Faces Faces Pain Scale: Hurts even more Pain Type: Acute pain Pain Location: Neck Pain Orientation: Posterior Pain Descriptors / Indicators: Shooting;Burning Pain Onset: With Activity (neck flexion within brace) Patients Stated Pain Goal: 0 Pain Intervention(s): Repositioned;Other (Comment) (Repositioned straps on brace to improve fit.) Home Living/Prior Functioning Home Living Available Help at Discharge: Family;Available 24 hours/day Type of Home: House Home Access: Stairs to enter [employer] of Steps: 5 Entrance Stairs-Rails: Can reach both Home Layout: Two level;Bed/bath upstairs Alternate Level Stairs-Number of Steps: 17 Alternate Level Stairs-Rails: Left Bathroom Shower/Tub: Chiropodist: Handicapped height Bathroom Accessibility: Yes Additional Comments: Patient's home environment requires extensive stair negotiation to enter home and to access primary bedroom/bathroom on 2nd floor of house  Lives With: Spouse;Daughter (Wife has dementia.) Prior Function Level of Independence: Requires assistive device for independence  Able to Take Stairs?: Yes Driving: No Vocation: Retired Comments: patient reports use of Rollator, RW,  [facility] Vision/Perception  Perception  Perception: Within Functional Limits Praxis Praxis: Intact  Cognition Overall Cognitive Status: Within Functional Limits for tasks assessed Arousal/Alertness: Awake/alert Orientation Level: Oriented X4 Attention: Selective Selective Attention: Appears intact Memory: Appears intact Awareness: Appears intact Safety/Judgment: Appears intact Sensation Sensation Light Touch: Impaired by gross assessment Coordination Gross Motor Movements are Fluid and Coordinated: No Fine Motor Movements are Fluid and Coordinated: No Coordination and Movement Description: Gross motor limited by pain on eval. Demonstrates some decreased strength with sensation in BLEs (LLE>RLE) Heel Shin Test: attempted and difficult to complete without pain d/t need for trunkal flexion Motor  Motor Motor: Within Functional Limits;Other (comment) Motor - Skilled Clinical Observations: Limited by pain   Trunk/Postural Assessment  Cervical Assessment Cervical Assessment: Exceptions to [facility] (Forward head - some increased forward movement from c-collar.) Thoracic Assessment Thoracic Assessment: Exceptions to [facility] (rounded shoulders with potential kyphosis - difficult to assess d/t pain with movement) Lumbar Assessment Lumbar Assessment: Exceptions to [facility] (posterior pelvic tilt with sacral sitting) Postural Control Postural Control: Deficits on evaluation (reduced cervical postural control requiring hard c-collar)  Balance Balance Balance Assessed: Yes Static Sitting Balance Static Sitting - Balance Support: Feet supported Static Sitting - Level of Assistance: 5: Stand by assistance (c/o dizziness upon reaching seated position) Dynamic Sitting Balance Dynamic Sitting - Balance Support: Feet supported;During functional activity Dynamic Sitting - Level of Assistance: 5: Stand by assistance;4: Min assist (CGA) Dynamic Sitting - Balance Activities: Forward lean/weight shifting;Reaching  for objects;Reaching across midline Static Standing Balance Static Standing - Balance Support: Bilateral upper extremity supported;During functional activity Static Standing - Level of Assistance: 4: Min assist Dynamic Standing Balance Dynamic Standing - Balance Support: During functional activity;Bilateral upper extremity supported Dynamic Standing - Level of Assistance: 3: Mod assist Extremity Assessment      RLE Assessment RLE Assessment: Exceptions to [facility] General Strength Comments: Grossly 4/ 5 prox to dist LLE Assessment LLE Assessment: Exceptions to [facility] General Strength Comments: Grossly 3+ to 4-/ 5 prox to 4/ 5 distal  Care Tool Care Tool Bed Mobility Roll left and right activity   Roll left and right assist level: Moderate Assistance - Patient 50 - 74%    Sit to lying activity   Sit to lying assist level: Moderate Assistance - Patient 50 - 74%    Lying to sitting edge of bed activity   Lying to sitting edge of bed assist level: Moderate Assistance - Patient 50 - 74%     Care Tool Transfers Sit to stand transfer   Sit to stand assist level: Moderate Assistance - Patient 50 - 74%    Chair/bed transfer   Chair/bed transfer assist level: Moderate Assistance - Patient 50 - 74%     Toilet transfer   Assist Level: Moderate Assistance - Patient 50 - 74%    Car transfer Car transfer activity did not occur: Safety/medical concerns        Care Tool Locomotion Ambulation Ambulation activity did not occur: Safety/medical concerns        Walk 10 feet activity Walk 10 feet activity did not occur: Safety/medical concerns       Walk 50 feet with 2 turns activity Walk 50 feet with 2 turns activity did not occur: Safety/medical concerns      Walk 150 feet activity Walk 150 feet activity did not occur: Safety/medical concerns      Walk 10 feet on uneven surfaces activity Walk 10 feet on uneven surfaces activity did not occur: Safety/medical concerns  Stairs Stair  activity did not occur: Safety/medical concerns        Walk up/down 1 step activity Walk up/down 1 step or curb (drop down) activity did not occur: Safety/medical concerns     Walk up/down 4 steps activity did not occuR: Safety/medical concerns  Walk up/down 4 steps activity      Walk up/down 12 steps activity Walk up/down 12 steps activity did not occur: Safety/medical concerns      Pick up small objects from floor Pick up small object from the floor (from standing position) activity did not occur: Safety/medical concerns      Wheelchair Will patient use wheelchair at discharge?: No Type of Wheelchair: Manual   Wheelchair assist level: Dependent - Patient 0% Max wheelchair distance: 10 ft  Wheel 50 feet with 2 turns activity Wheelchair 50 feet with 2 turns activity did not occur: Safety/medical concerns Assist Level: Dependent - Patient 0%  Wheel 150 feet activity Wheelchair 150 feet activity did not occur: Safety/medical concerns      Refer to Care Plan for Long Term Goals  SHORT TERM GOAL WEEK 1 PT Short Term Goal 1 (Week 1): Pt will perform bed mobility with consistent Min A following cervical/ spinal precautions. PT Short Term Goal 2 (Week 1): Pt will perform STS with Min A using RW. PT Short Term Goal 3 (Week 1): Pt will perform SPVT with consistent Min A and reduced pain using RW. PT Short Term Goal 4 (Week 1): Pt will ambulate at least 20 feet with Min A using RW.  Recommendations for other services: Therapeutic Recreation  Stress management and Outing/community reintegration  Skilled Therapeutic Intervention Mobility Bed Mobility Bed Mobility: Sit to Supine;Rolling Left;Left Sidelying to Sit;Sitting - Scoot to Edge of Bed;Sit to Sidelying Left Rolling Left: Moderate Assistance - Patient 50-74% Left Sidelying to Sit: Moderate Assistance - Patient 50-74% Sitting - Scoot to Edge of Bed: Minimal Assistance - Patient > 75% Sit to Supine: Moderate Assistance - Patient  50-74% Transfers Transfers: Sit to Stand;Stand Pivot Transfers;Stand to Sit Sit to Stand: Moderate Assistance - Patient 50-74% Stand to Sit: Minimal Assistance - Patient > 75% Stand Pivot Transfers: Moderate Assistance - Patient 50 - 74% Stand Pivot Transfer Details: Verbal cues for technique;Verbal cues for sequencing;Verbal cues for precautions/safety;Verbal cues for safe use of DME/AE Transfer (Assistive device): Rolling walker Locomotion  Gait Ambulation: No Wheelchair Mobility Wheelchair Mobility: No  Treatment Session: PT Evaluation completed; see above for results. PT educated patient in roles of PT vs OT, PT POC, rehab potential, rehab goals, and discharge recommendations along with recommendation for follow-up rehabilitation services. Individual treatment initiated:  Patient supine in bed upon PT arrival. Patient alert and agreeable to PT session. No pain complaint while supine.   Therapeutic Activity: Bed Mobility: Patient required education re: performance of log roll technique  to remain in spinal movement precautions and decrease risk for cervical rotation. Required Mod A to complete roll to L side. Then continuous vc throughout performance of rise to sit with Mod A. Is able to scoot forward with Min A but required extra time to complete to bring feet onto floor.   While seated, pt pulls on bottom of c-collar in attempt to alleviate pain that he states is radiating up into his head. C-collar appears to be holding pt's head in forward position. Collar adjusted in attempt to alleviate forward pressure with pt relating minimal pain relief but continues to hold bottom of collar out to hold head in  more neutral position.  Transfers: Patient performed STS and SPVT transfers EOB <> w/c with Mod A using RW. Provided verbal cues for pivot stepping and walker mgmt.  Discussion with MD re: need for orthotist consult for c-collar assessment for more neutral fitting c-collar.   Patient  supine  in bed at end of session with brakes locked, bed alarm set, and all needs within reach. In supine, pt relates pain is decreasing. With adjustment of pillow behind pt's shoulders and head rather than just behind head, pt relates decrease in pain. Pt's bias is toward more neutral neck positioning and needs to be kept from neck flexion which is appropriate with cervical ankylosis diagnosis.  Session 2:   Patient supine in bed upon PT arrival. Patient alert and agreeable to PT session. No pain complaint during session. Pt brought to seated position on EOB with Mod A using log roll technique in attempt to transfer to w/c for session out of room. Once in upright positioning, pt's pain in neck/ head returns and relates he would like to return to bed. Return to supine with Mod A. Pt easily falls asleep with improved pillow positioning and is then difficult to rouse to end session. Pt allowed to sleep.   Discharge Criteria: Patient will be discharged from PT if patient refuses treatment 3 consecutive times without medical reason, if treatment goals not met, if there is a change in medical status, if patient makes no progress towards goals or if patient is discharged from hospital.  The above assessment, treatment plan, treatment alternatives and goals were discussed and mutually agreed upon: by patient  Jared Bridges 07/06/2021, 12:18 PM

## 2021-07-06 NOTE — Evaluation (Signed)
Occupational Therapy Assessment and Plan  Patient Details  Name: Jared Bridges MRN: [id number] Date of Birth: [birth year]  OT Diagnosis: acute pain and muscle weakness (generalized) Rehab Potential: Rehab Potential (ACUTE ONLY): Good ELOS: 12-14 days   Today's Date: 07/06/2021 OT Individual Time: 5638-7564 OT Individual Time Calculation (min): 75 min     Hospital Problem: Active Problems:   C6 [facility]   Past Medical History:  Past Medical History:  Diagnosis Date   Anemia, iron deficiency    Chronic headaches    Migraines   Chronic kidney disease    Resolved   Depression    Diverticulitis    Essential hypertension    GI bleed    Dr. Laural Golden - 1998   Hypercholesteremia    Orthostatic hypotension    Peptic ulcer disease    Peripheral neuropathy    Polymyalgia rheumatica ([name]    Stroke ([place])    Residual trouble reading and writing   Syncope    Thyroid nodule    TIA (transient ischemic attack) 12/2017   Type 2 diabetes mellitus ([place])    Past Surgical History:  Past Surgical History:  Procedure Laterality Date   APPENDECTOMY     as child   BACK SURGERY     2000 IDET SPINAL PROC   CHOLECYSTECTOMY N/A 04/07/2016   Procedure: CHOLECYSTECTOMY;  Surgeon: Aviva Signs, MD;  Location: [facility];  Service: General;  Laterality: N/A;   [place]   COLONOSCOPY N/A 01/07/2018   Procedure: COLONOSCOPY;  Surgeon: Rogene Houston, MD;  Location: [facility];  Service: Endoscopy;  Laterality: N/A;  1:00   CRYOTHERAPY     EYE SURGERY     right cataract with lens implant   FEMUR SURGERY     RT LEG        1953   HAND SURGERY     RIGHT   HERNIA REPAIR     1-right inguinal, 3- left inguinal   IRRIGATION AND DEBRIDEMENT KNEE Left 07/09/2015   Procedure: LEFT KNEE IRRIGATION AND DEBRIDEMENT WOUND CLOSURE;  Surgeon: Gaynelle Arabian, MD;  Location: [facility];  Service: Orthopedics;  Laterality: Left;   JOINT REPLACEMENT  02/28/2016   lt knee  revision   KNEE DEBRIDEMENT     2016   LEFT   MENISCUS REPAIR     LEFT   POLYPECTOMY  01/07/2018   Procedure: POLYPECTOMY;  Surgeon: Rogene Houston, MD;  Location: [facility];  Service: Endoscopy;;  colon    SHOULDER OPEN ROTATOR CUFF REPAIR     RT SHOULDER   SPINAL FUSION     SPINAL FUSION     TONSILLECTOMY     TOTAL KNEE ARTHROPLASTY Left 05/28/2015   Procedure: LEFT TOTAL KNEE ARTHROPLASTY;  Surgeon: Gaynelle Arabian, MD;  Location: [facility];  Service: Orthopedics;  Laterality: Left;   TOTAL KNEE REVISION Left 02/28/2016   Procedure: LEFT TOTAL KNEE REVISION;  Surgeon: Leandrew Koyanagi, MD;  Location: [facility];  Service: Orthopedics;  Laterality: Left;   TRIGGER FINGER RELEASE     RT HAND   1990S   UVULECTOMY  2004   TURBINATE,TONSIL,ADENOID    Assessment & Plan Clinical Impression:  HPI: Franciscojavier Wronski. Willmore is an 87 year old male with history of T2DM, HTN, PMR, peripheral neuropathy, B-knee instability w/ falls, right bimalleolar ankle Fx  1/22, prostate cancer with mets;  who was admitted on 07/01/21 after fall with numbness  in BUE with LE weakness.  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 He was transferred to [facility] for management and MRI brain/spine done revealing dorsal epidural hematoma extending from C5-T1 with severe narrowing of thecal sac and mildly compressing spinal cord with focal tear of ligamentous flavum at C6/7 and diffuse ankylosis with fracture of vertebral osteophyte at C6/7. Films evaluated by Dr. Christella Noa who recommended C-collar for support and conservative management. Patient continues to be limited by weakness with numbness BUE with tremors,neck pain and BLE weakness affecting ADLs and mobility. CIR recommended due to functional decline.     Patient transferred to CIR on 07/05/2021 .    Patient currently requires  min-max assist  with basic self-care skills secondary to muscle  weakness, decreased cardiorespiratoy endurance, and decreased standing balance, decreased postural control, and decreased balance strategies.  Prior to hospitalization, patient could complete BADLs with  min-supervision .  Patient will benefit from skilled intervention to increase independence with basic self-care skills prior to discharge home with care partner.  Anticipate patient will require 24 hour supervision and minimal physical assistance and  TBD .  OT - End of Session Activity Tolerance: Decreased this session Endurance Deficit: Yes OT Assessment Rehab Potential (ACUTE ONLY): Good OT Patient demonstrates impairments in the following area(s): Balance;Safety;Sensory;Skin Integrity;Endurance;Motor;Pain OT Basic ADL's Functional Problem(s): Grooming;Bathing;Dressing;Toileting OT Transfers Functional Problem(s): Tub/Shower OT Additional Impairment(s): Fuctional Use of Upper Extremity OT Plan OT Intensity: Minimum of 1-2 x/day, 45 to 90 minutes OT Frequency: 5 out of 7 days OT Duration/Estimated Length of Stay: 12-14 days OT Treatment/Interventions: Balance/vestibular training;Neuromuscular re-education;Self Care/advanced ADL retraining;Therapeutic Exercise;DME/adaptive equipment instruction;Pain management;UE/LE Strength taining/ROM;Skin care/wound managment;Wheelchair propulsion/positioning;Community reintegration;Functional electrical stimulation;Patient/family education;Splinting/orthotics;UE/LE Coordination activities;Discharge planning;Functional mobility training;Psychosocial support;Therapeutic Activities OT Self Feeding Anticipated Outcome(s): N/A OT Basic Self-Care Anticipated Outcome(s): min assist- supervision OT Toileting Anticipated Outcome(s): min assist OT Bathroom Transfers Anticipated Outcome(s): supervision OT Recommendation Patient destination: Home Follow Up Recommendations: Other (comment) (TBD) Equipment Recommended: To be determined   OT  Evaluation Precautions/Restrictions  Precautions Precautions: Cervical;Fall Cervical Brace: Hard collar General   Vital Signs Therapy Vitals Temp: 98.2 F (36.8 C) Temp Source: Oral Pulse Rate: (!) 59 Resp: 18 BP: (!) 114/52 Patient Position (if appropriate): Lying Oxygen Therapy SpO2: 100 % O2 Device: Room Air Pain Pain Assessment Pain Scale: 0-10 Pain Score: 7  Pain Type: Acute pain Pain Location: Neck Pain Orientation: Posterior Pain Descriptors / Indicators: Shooting Pain Intervention(s): Medication (See eMAR) Home Living/Prior Functioning Home Living Family/patient expects to be discharged to:: Private residence Living Arrangements: Spouse/significant other Available Help at Discharge: Family, Available 24 hours/day Type of Home: House Home Access: Stairs to enter Technical brewer of Steps: 5 Entrance Stairs-Rails: Can reach both Home Layout: Two level, Bed/bath upstairs Alternate Level Stairs-Number of Steps: 17 Bathroom Shower/Tub: Chiropodist: Handicapped height Bathroom Accessibility: Yes  Lives With: Spouse, Daughter Vision Baseline Vision/History: No visual deficits Patient Visual Report: No change from baseline Vision Assessment?: No apparent visual deficits Perception  Perception: Within Functional Limits Praxis Praxis: Intact Cognition Overall Cognitive Status: Within Functional Limits for tasks assessed Arousal/Alertness: Awake/alert Orientation Level: Person;Place;Situation Person: Oriented Place: Oriented Situation: Oriented Year: 2022 Month: August Day of Week: Correct Memory: Appears intact Immediate Memory Recall: Sock;Blue;Bed Memory Recall Sock: Without Cue Memory Recall Blue: Without Cue Memory Recall Bed: Without Cue Attention: Selective Selective Attention: Appears intact Awareness: Appears intact Sensation Sensation Light Touch: Impaired by gross assessment Light  Touch Impaired Details:  Impaired LUE Coordination Gross Motor Movements are Fluid and Coordinated: No Fine Motor Movements are Fluid and Coordinated: No Coordination and Movement Description: Gross motor limited by pain on eval. Demonstrates intension tremors with BUEs and decreased strength with sensation in BUEs (LUE>RUE) Motor  Motor Motor - Skilled Clinical Observations: Limited by pain  Trunk/Postural Assessment  Thoracic Assessment Thoracic Assessment: Exceptions to Hosp [facility]  Lumbar Assessment Lumbar Assessment: Exceptions to [facility], [facility], [facility]  Balance Balance Balance Assessed: Yes Static Sitting Balance Static Sitting - Balance Support: Feet supported Static Sitting - Level of Assistance: 7: Independent Dynamic Sitting Balance Dynamic Sitting - Balance Support: Feet supported;During functional activity Dynamic Sitting - Level of Assistance: 7: Independent Static Standing Balance Static Standing - Balance Support: Bilateral upper extremity supported Static Standing - Level of Assistance: 4: Min assist Dynamic Standing Balance Dynamic Standing - Balance Support: During functional activity;Bilateral upper extremity supported Dynamic Standing - Level of Assistance: 4: Min assist Extremity/Trunk Assessment RUE Assessment RUE Assessment: Exceptions to [facility] Active Range of Motion (AROM) Comments: WFL General Strength Comments: 4/5 LUE Assessment LUE Assessment: Exceptions to [facility] Passive Range of Motion (PROM) Comments: WFL Active Range of Motion (AROM) Comments: shoulder flexion limited to approx 120 degrees on eval General Strength Comments: 3+/5  Care Tool Care Tool Self Care Eating   Eating Assist Level: Set up assist    Oral Care    Oral Care Assist Level: Set up assist    Bathing   Body parts bathed by patient: Face;Left arm;Right arm;Abdomen;Chest Body parts bathed by helper: Front perineal area;Buttocks;Right upper leg;Left upper leg;Right lower leg;Left lower leg   Assist Level: Moderate Assistance -  Patient 50 - 74%    Upper Body Dressing(including orthotics)   What is the patient wearing?: Hospital gown only   Assist Level: Maximal Assistance - Patient 25 - 49%    Lower Body Dressing (excluding footwear)   What is the patient wearing?: Pants Assist for lower body dressing: Maximal Assistance - Patient 25 - 49%    Putting on/Taking off footwear   What is the patient wearing?: [place] for footwear: Total Assistance - Patient < 25%       Care Tool Toileting Toileting activity Toileting Activity did not occur (Clothing management and hygiene only): N/A (no void or bm)       Care Tool Bed Mobility Roll left and right activity        Sit to lying activity   Sit to lying assist level: Moderate Assistance - Patient 50 - 74%    Lying to sitting edge of bed activity   Lying to sitting edge of bed assist level: Minimal Assistance - Patient > 75%     Care Tool Transfers Sit to stand transfer   Sit to stand assist level: Moderate Assistance - Patient 50 - 74%    Chair/bed transfer         Toilet transfer         Care Tool Cognition Expression of Ideas and Wants Expression of Ideas and Wants: Without difficulty (complex and basic) - expresses complex messages without difficulty and with speech that is clear and easy to understand   Understanding Verbal and Non-Verbal Content Understanding Verbal and Non-Verbal Content: Understands (complex and basic) - clear comprehension without cues or repetitions   Memory/Recall Ability *first 3 days only Memory/Recall Ability *first 3 days only: Current season;That he or she is in a hospital/hospital unit;Location of own room    Refer to Care  Plan for Long Term Goals  SHORT TERM GOAL WEEK 1 OT Short Term Goal 1 (Week 1): Pt will engage in functional activity for 3 minutes without rest to increase activity tolerance required for ADLs. OT Short Term Goal 2 (Week 1): Pt will complete LB dressing with mod assist, using AE prn. OT  Short Term Goal 3 (Week 1): Pt will complete toilet transfer with min assist.  Recommendations for other services: None    Skilled Therapeutic Intervention OT evaluation completed. Discussed role of OT, goals in therapy, safety plan, precautions, and possible DME. Pt very pleasant and agreeable to bathing and dressing this AM. He completed supine>sit with min A while adhering to precautions, however increased pain reported at neck. OT assisted with pain management techniques and notified RN. With time, pt completed sponge bath and donned pants with max assist as he completed sit<>stand with mod. Transitioned back to supine per pt's request. Discussed FM activities to complete in bed with pt verbalizing improvement with finger to thumb opposition. Pt left supine in bed with all needs in reach.   ADL   Mobility  Bed Mobility Bed Mobility: Sit to Supine;Supine to Sit Supine to Sit: Minimal Assistance - Patient > 75% Sit to Supine: Moderate Assistance - Patient 50-74% Transfers Sit to Stand: Moderate Assistance - Patient 50-74%   Discharge Criteria: Patient will be discharged from OT if patient refuses treatment 3 consecutive times without medical reason, if treatment goals not met, if there is a change in medical status, if patient makes no progress towards goals or if patient is discharged from hospital.  The above assessment, treatment plan, treatment alternatives and goals were discussed and mutually agreed upon: by patient  Duayne Cal 07/06/2021, 8:04 AM

## 2021-07-06 NOTE — H&P (Signed)
Physical Medicine and Rehabilitation Admission H&P        Chief Complaint  Patient presents with   Functional deficits due to fall with SCI/epidural hematoma.       HPI: Jared Bridges. Fredieu is an 87 year old male with history of T2DM, HTN, PMR, peripheral neuropathy, B-knee instability w/ falls, right bimalleolar ankle Fx  1/22, prostate cancer with mets;  who was admitted on 07/01/21 after fall with numbness in BUE with LE weakness.  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 He was transferred to [facility] for management and MRI brain/spine done revealing dorsal epidural hematoma extending from C5-T1 with severe narrowing of thecal sac and mildly compressing spinal cord with focal tear of ligamentous flavum at C6/7 and diffuse ankylosis with fracture of vertebral osteophyte at C6/7. Films evaluated by Dr. Christella Noa who recommended C-collar for support and conservative management. Patient continues to be limited by weakness with numbness BUE with tremors,neck pain and BLE weakness affecting ADLs and mobility. CIR recommended due to functional decline.      Review of Systems Constitutional:  Negative for chills and fever. HENT:  Positive for hearing loss (both but deaf in left ear).   Eyes:  Negative for blurred vision, pain and discharge. Respiratory:  Positive for sputum production. Negative for cough and shortness of breath.   Cardiovascular:  Negative for chest pain, palpitations and leg swelling. Gastrointestinal:  Negative for constipation, heartburn and nausea. Genitourinary:  Negative for dysuria and urgency. Musculoskeletal:  Positive for myalgias and neck pain. Neurological:  Positive for tingling, tremors (RUE>LUE new since fall) and weakness. Psychiatric/Behavioral:  The patient is not nervous/anxious and does not have insomnia.           Past Medical History:  Diagnosis Date   Anemia,  iron deficiency     Chronic headaches      Migraines   Chronic kidney disease      Resolved   Depression     Diverticulitis     Essential hypertension     GI bleed      Dr. Laural Golden - 1998   Hypercholesteremia     Orthostatic hypotension     Peptic ulcer disease     Peripheral neuropathy     Polymyalgia rheumatica ([name]     Stroke ([place])      Residual trouble reading and writing   Syncope     Thyroid nodule     TIA (transient ischemic attack) 12/2017   Type 2 diabetes mellitus ([place])             Past Surgical History:  Procedure Laterality Date   APPENDECTOMY        as child   BACK SURGERY        2000 IDET SPINAL PROC   CHOLECYSTECTOMY N/A 04/07/2016    Procedure: CHOLECYSTECTOMY;  Surgeon: Aviva Signs, MD;  Location: [facility];  Service: General;  Laterality: N/A;   [place]   COLONOSCOPY N/A 01/07/2018    Procedure: COLONOSCOPY;  Surgeon: Rogene Houston, MD;  Location: [facility];  Service: Endoscopy;  Laterality: N/A;  1:00   CRYOTHERAPY       EYE SURGERY        right cataract with lens implant   FEMUR SURGERY        RT LEG  1953   HAND SURGERY        RIGHT   HERNIA REPAIR        1-right inguinal, 3- left inguinal   IRRIGATION AND DEBRIDEMENT KNEE Left 07/09/2015    Procedure: LEFT KNEE IRRIGATION AND DEBRIDEMENT WOUND CLOSURE;  Surgeon: Gaynelle Arabian, MD;  Location: [facility];  Service: Orthopedics;  Laterality: Left;   JOINT REPLACEMENT   02/28/2016    lt knee revision   KNEE DEBRIDEMENT        2016   LEFT   MENISCUS REPAIR        LEFT   POLYPECTOMY   01/07/2018    Procedure: POLYPECTOMY;  Surgeon: Rogene Houston, MD;  Location: [facility];  Service: Endoscopy;;  colon     SHOULDER OPEN ROTATOR CUFF REPAIR        RT SHOULDER   SPINAL FUSION       SPINAL FUSION       TONSILLECTOMY       TOTAL KNEE ARTHROPLASTY Left 05/28/2015    Procedure: LEFT TOTAL KNEE ARTHROPLASTY;  Surgeon: Gaynelle Arabian, MD;  Location: [facility];   Service: Orthopedics;  Laterality: Left;   TOTAL KNEE REVISION Left 02/28/2016    Procedure: LEFT TOTAL KNEE REVISION;  Surgeon: Leandrew Koyanagi, MD;  Location: [place];  Service: Orthopedics;  Laterality: Left;   TRIGGER FINGER RELEASE        RT HAND   1990S   UVULECTOMY   2004    TURBINATE,TONSIL,ADENOID           Family History  Problem Relation Age of Onset   Colon cancer Sister     Hypertension Other     Depression Brother     Depression Daughter        Social History:  Married. Wife has dementia. Retired Scientific laboratory technician.  He reports that he quit smoking about 34 years ago. His smoking use included cigarettes. He started smoking about 62 years ago. He has a 1.80 pack-year smoking history. He has never used smokeless tobacco. He reports that he does not drink alcohol and does not use drugs.          Allergies  Allergen Reactions   Iodine Hives, Itching and Other (See Comments)      very allergic(per daughter), can pre-med with benadryl   Calan [Verapamil] Other (See Comments)      Weakness   Cymbalta [Duloxetine Hcl] Other (See Comments)      Makes patient have jerking motions.   Iodinated Diagnostic Agents Hives, Itching and Other (See Comments)      Can pre-med with benadryl   Latex Other (See Comments)      Redness iritation   Lisinopril Other (See Comments)      Makes patient have jerking motions.   Neurontin [Gabapentin] Other (See Comments)      Makes patient have jerking motions.   Zoloft [Sertraline Hcl] Diarrhea and Other (See Comments)      Makes patient have jerking motions.   Valium [Diazepam] Other (See Comments)      "Did not work"   Flomax [Tamsulosin Hcl] Other (See Comments)      Dizziness   Glimepiride Other (See Comments)      "Did not work"   Melatonin Nausea Only   Tape Other (See Comments)      Skin irritation            Medications Prior to Admission  Medication Sig Dispense Refill  alfuzosin (UROXATRAL) 10 MG 24 hr tablet Take 10 mg by  mouth every evening.       atorvastatin (LIPITOR) 40 MG tablet TAKE 1 TABLET BY MOUTH DAILY 90 tablet 0   Cholecalciferol (VITAMIN D3) 5000 units CAPS Take 5,000 Units by mouth at bedtime.       Ciclopirox 0.77 % gel Apply 1 application topically 2 (two) times daily. to affected area(s)       diclofenac sodium (VOLTAREN) 1 % GEL Apply 2 g topically daily as needed (for pain). 2 g 1   enzalutamide (XTANDI) 80 MG tablet Take 80 mg by mouth daily.       escitalopram (LEXAPRO) 20 MG tablet Take 20 mg by mouth daily.       finasteride (PROSCAR) 5 MG tablet Take 1 tablet (5 mg total) by mouth daily. 90 tablet 3   fluocinonide cream (LIDEX) [id number] % Apply 1 application topically daily as needed (for irritation).       HM PAIN RELIEF EXTRA STRENGTH 500 MG tablet Take 1,000 mg by mouth every 8 (eight) hours as needed for pain.       midodrine (PROAMATINE) 10 MG tablet Take 10 mg by mouth 3 (three) times daily.       MYRBETRIQ 50 MG TB24 tablet Take 50 mg by mouth daily.       pantoprazole (PROTONIX) 20 MG tablet TAKE 1 TABLET BY MOUTH DAILY 30 tablet 0   pregabalin (LYRICA) 75 MG capsule Take 75 mg by mouth 2 (two) times daily.       topiramate (TOPAMAX) 50 MG tablet Take 50 mg by mouth 2 (two) times daily.       venlafaxine XR (EFFEXOR-XR) 37.5 MG 24 hr capsule TAKE ONE CAPSULE BY MOUTH EVERY MORNING 30 capsule 3   vitamin B-12 (CYANOCOBALAMIN) 500 MCG tablet Take 1,000 mcg by mouth daily.       fluticasone (FLONASE) 50 MCG/ACT nasal spray Place 2 sprays into both nostrils daily. (Patient not taking: No sig reported) 16 g 0      Drug Regimen Review  Drug regimen was reviewed and remains appropriate with no significant issues identified   Home: Home Living Family/patient expects to be discharged to:: Private residence Living Arrangements: Spouse/significant other (daughter) Available Help at Discharge: Family, Available 24 hours/day Type of Home: House Home Access: Stairs to enter [employer] of Steps: 5 Entrance Stairs-Rails: Right, Left, Can reach both Home Layout: Two level, Bed/bath upstairs Alternate Level Stairs-Number of Steps: 17 Alternate Level Stairs-Rails: Right Bathroom Shower/Tub: Chiropodist: Handicapped height Bathroom Accessibility: Yes Home Equipment: Environmental consultant - 2 wheels, Walker - 4 wheels, Shower seat Additional Comments: Patient's home environment requires extensive stair negotiation to enter home and to access primary bedroom/bathroom on 2nd floor of house  Lives With: Spouse, Daughter   Functional History: Prior Function Level of Independence: Needs assistance, Independent with assistive device(s) Gait / Transfers Assistance Needed: modified independent with rollator/RW ADL's / Homemaking Assistance Needed: daughter assisted with LB ADL and IADL tasks Communication / Swallowing Assistance Needed: HOH   Functional Status:  Mobility: Bed Mobility Overal bed mobility: Needs Assistance Bed Mobility: Rolling, Sidelying to Sit Rolling: Min assist Sidelying to sit: Mod assist Sit to sidelying: Max assist, +2 for physical assistance General bed mobility comments: Required assist for trunk and LEs. Cues for log roll technique Transfers Overall transfer level: Needs assistance Equipment used: Rolling walker (2 wheeled) Transfers: Sit to/from Stand Sit to Stand: Min assist, +  2 physical assistance General transfer comment: from raised bed and recliner; VCs for safe hand placement Ambulation/Gait Ambulation/Gait assistance: Min assist, +2 safety/equipment Gait Distance (Feet): 12 Feet (+ 10 ft) Assistive device: Rolling walker (2 wheeled) Gait Pattern/deviations: Step-to pattern General Gait Details: pt with significant ER with RLE, slight hyperextension in LLE when cued to increase stride length. Gait velocity: decreased   ADL: ADL Overall ADL's : Needs assistance/impaired Eating/Feeding: Set up, Sitting Eating/Feeding  Details (indicate cue type and reason): in recliner, using regular spoon for eating applesauce Grooming: Moderate assistance Upper Body Bathing: Moderate assistance Lower Body Bathing: Maximal assistance, Sit to/from stand Upper Body Dressing : Moderate assistance Lower Body Dressing: Maximal assistance, Sit to/from stand Toilet Transfer: Minimal assistance, +2 for safety/equipment, RW, Ambulation Toilet Transfer Details (indicate cue type and reason): simulated bed>10 feet> sit in recliner>10 feet>sit in recliner Toileting- Clothing Manipulation and Hygiene: Maximal assistance Toileting - Clothing Manipulation Details (indicate cue type and reason): wears brief; incontinence? Functional mobility during ADLs: Moderate assistance, Cueing for safety, +2 for physical assistance   Cognition: Cognition Overall Cognitive Status: Impaired/Different from baseline Orientation Level: Oriented X4 Cognition Arousal/Alertness: Awake/alert Behavior During Therapy: WFL for tasks assessed/performed Overall Cognitive Status: Impaired/Different from baseline Area of Impairment: Following commands, Safety/judgement, Problem solving Following Commands: Follows one step commands inconsistently, Follows one step commands with increased time Safety/Judgement: Decreased awareness of safety Problem Solving: Difficulty sequencing, Requires verbal cues, Requires tactile cues General Comments: Pt alert and oriented however was unaware of being at [facility]; knew he had been at [facility]     Blood pressure (!) 137/59, pulse 94, temperature 100 F (37.8 C), temperature source Oral, resp. rate (!) 24, height '6\' 2"'$  (1.88 m), weight 95 kg, SpO2 98 %. Physical Exam Vitals and nursing note reviewed. Constitutional:      Comments: Elderly gentleman with C collar in place indicating neck pain/discomfort. Alert and appropriate. Discussed Dr. Anastasio Champion passing away.   Pulmonary:    Breath sounds: No stridor. Musculoskeletal:     Comments: Muscle wasting left knee. Well healed old B-TKR incisions.  in [place] collar  Neurological:    Mental Status: He is alert and oriented to person, place, and time.    Comments: Wet voice but able to clear secretions. No coughing. Intention tremors RUE>LUE with weak grips and left triceps weakness.  C7 motor left side weaker than right. Bilateral upper extermity sensory loss. LE 4-5/5. No sensory loss. No resting tone      Lab Results Last 48 Hours        Results for orders placed or performed during the hospital encounter of 07/01/21 (from the past 48 hour(s))  Glucose, capillary     Status: Abnormal    Collection Time: 07/03/21  3:48 PM  Result Value Ref Range    Glucose-Capillary 199 (H) 70 - 99 mg/dL      Comment: Glucose reference range applies only to samples taken after fasting for at least 8 hours.  Glucose, capillary     Status: Abnormal    Collection Time: 07/03/21 10:58 PM  Result Value Ref Range    Glucose-Capillary 174 (H) 70 - 99 mg/dL      Comment: Glucose reference range applies only to samples taken after fasting for at least 8 hours.  Glucose, capillary     Status: Abnormal    Collection Time: 07/04/21  7:59 AM  Result Value Ref Range    Glucose-Capillary 161 (H) 70 - 99 mg/dL  Comment: Glucose reference range applies only to samples taken after fasting for at least 8 hours.  Glucose, capillary     Status: Abnormal    Collection Time: 07/04/21 11:54 AM  Result Value Ref Range    Glucose-Capillary 256 (H) 70 - 99 mg/dL      Comment: Glucose reference range applies only to samples taken after fasting for at least 8 hours.  Glucose, capillary     Status: Abnormal    Collection Time: 07/04/21  4:28 PM  Result Value Ref Range    Glucose-Capillary 123 (H) 70 - 99 mg/dL      Comment: Glucose reference range applies only to samples taken after fasting for at least 8 hours.  Glucose, capillary     Status: Abnormal    Collection Time: 07/04/21  7:34 PM  Result  Value Ref Range    Glucose-Capillary 168 (H) 70 - 99 mg/dL      Comment: Glucose reference range applies only to samples taken after fasting for at least 8 hours.    Comment 1 Notify RN      Comment 2 Document in Chart    Glucose, capillary     Status: Abnormal    Collection Time: 07/04/21 11:33 PM  Result Value Ref Range    Glucose-Capillary 164 (H) 70 - 99 mg/dL      Comment: Glucose reference range applies only to samples taken after fasting for at least 8 hours.    Comment 1 Notify RN      Comment 2 Document in Chart    Glucose, capillary     Status: Abnormal    Collection Time: 07/05/21  7:26 AM  Result Value Ref Range    Glucose-Capillary 169 (H) 70 - 99 mg/dL      Comment: Glucose reference range applies only to samples taken after fasting for at least 8 hours.  Glucose, capillary     Status: Abnormal    Collection Time: 07/05/21 11:17 AM  Result Value Ref Range    Glucose-Capillary 258 (H) 70 - 99 mg/dL      Comment: Glucose reference range applies only to samples taken after fasting for at least 8 hours.      Imaging Results (Last 48 hours)  No results found.           Medical Problem List and Plan: 1.  Functional and mobility deficits secondary to fall with C6/7 fx and EDH and subsequent cervical cord compression at that level. He is a C7 Central cord.             -patient may not yet shower             -ELOS/Goals: 10-12 days, supervision to min assist  -Therapy reported that collar was forcing him into flexion when he is out of bed. Will ask [employer] to assess for replacement collar which will be more neutral. 2.  Antithrombotics: -DVT/anticoagulation:  Mechanical: Sequential compression devices, below knee Bilateral lower extremities             -antiplatelet therapy: N/A 3. Pain Management: Continue tylenol TID with Oxycodone prn 4. Mood: LCSW to follow for evaluation and support/              -antipsychotic agents: N/A 5. Neuropsych: This patient is capable of  making decisions on his own behalf. 6. Skin/Wound Care: routine pressure relief measures.  7. Fluids/Electrolytes/Nutrition: Monitor I/O. Check BMET on 08/08 8. HTN: Monitor BP tid 9. T2DM: Monitor BS  ac/hs. Use SSI for elevated BS 10. PMR with peripheral neuropathy: ON Topamax, Lyrica, 11. Prostate cancer w/bony mets:  T[facility] with XRT 01/[facility]21 at [facility] --On Xandi with proscar 12. Wet voice: improved today  -cxr reviewed and is clear             -his injury and edema in the setting of his ankylosis and osteophytes certainly will put him at risk for aspiration 13. H/o depression: ON Effexor.  14. Constipation: augment regimen         Bary Leriche, PA-C 07/05/2021   I have personally performed a face to face diagnostic evaluation of this patient and formulated the key components of the plan.  Additionally, I have personally reviewed laboratory data, imaging studies, as well as relevant notes and concur with the physician assistant's documentation above.  The patient's status has not changed from the original H&P.  Any changes in documentation from the acute care chart have been noted above.  Meredith Staggers, MD, Mellody Drown

## 2021-07-06 NOTE — Progress Notes (Signed)
Inpatient Rehabilitation  Patient information reviewed and entered into eRehab system by Weston Fulco M. Nicolai Labonte, M.A., CCC/SLP, PPS Coordinator.  Information including medical coding, functional ability and quality indicators will be reviewed and updated through discharge.    

## 2021-07-07 DIAGNOSIS — I10 Essential (primary) hypertension: Secondary | ICD-10-CM | POA: Diagnosis not present

## 2021-07-07 DIAGNOSIS — K592 Neurogenic bowel, not elsewhere classified: Secondary | ICD-10-CM

## 2021-07-07 DIAGNOSIS — N39490 Overflow incontinence: Secondary | ICD-10-CM

## 2021-07-07 DIAGNOSIS — S14127S Central cord syndrome at C7 level of cervical spinal cord, sequela: Secondary | ICD-10-CM | POA: Diagnosis not present

## 2021-07-07 LAB — GLUCOSE, CAPILLARY
Glucose-Capillary: 170 mg/dL — ABNORMAL HIGH (ref 70–99)
Glucose-Capillary: 248 mg/dL — ABNORMAL HIGH (ref 70–99)
Glucose-Capillary: 167 mg/dL — ABNORMAL HIGH (ref 70–99)
Glucose-Capillary: 195 mg/dL — ABNORMAL HIGH (ref 70–99)

## 2021-07-07 MED ORDER — SORBITOL 70 % SOLN
60.0000 mL | Status: AC
  Administered 2021-07-07: 60 mL via ORAL
  Filled 2021-07-07: qty 60

## 2021-07-07 NOTE — Plan of Care (Signed)
  Problem: RH Balance Goal: LTG Patient will maintain dynamic sitting balance (PT) Description: LTG:  Patient will maintain dynamic sitting balance with assistance during mobility activities (PT) Flowsheets (Taken 07/06/2021 1748) LTG: Pt will maintain dynamic sitting balance during mobility activities with:: Independent with assistive device  Goal: LTG Patient will maintain dynamic standing balance (PT) Description: LTG:  Patient will maintain dynamic standing balance with assistance during mobility activities (PT) Flowsheets (Taken 07/06/2021 1748) LTG: Pt will maintain dynamic standing balance during mobility activities with:: Supervision/Verbal cueing   Problem: Sit to Stand Goal: LTG:  Patient will perform sit to stand with assistance level (PT) Description: LTG:  Patient will perform sit to stand with assistance level (PT) Flowsheets (Taken 07/06/2021 1748) LTG: PT will perform sit to stand in preparation for functional mobility with assistance level: Supervision/Verbal cueing   Problem: RH Bed Mobility Goal: LTG Patient will perform bed mobility with assist (PT) Description: LTG: Patient will perform bed mobility with assistance, with/without cues (PT). Flowsheets (Taken 07/06/2021 1748) LTG: Pt will perform bed mobility with assistance level of: Supervision/Verbal cueing   Problem: RH Bed to Chair Transfers Goal: LTG Patient will perform bed/chair transfers w/assist (PT) Description: LTG: Patient will perform bed to chair transfers with assistance (PT). Flowsheets (Taken 07/06/2021 1748) LTG: Pt will perform Bed to Chair Transfers with assistance level: Supervision/Verbal cueing   Problem: RH Car Transfers Goal: LTG Patient will perform car transfers with assist (PT) Description: LTG: Patient will perform car transfers with assistance (PT). Flowsheets (Taken 07/06/2021 1748) LTG: Pt will perform car transfers with assist:: Contact Guard/Touching assist   Problem: RH Furniture  Transfers Goal: LTG Patient will perform furniture transfers w/assist (OT/PT) Description: LTG: Patient will perform furniture transfers  with assistance (OT/PT). Flowsheets (Taken 07/06/2021 1748) LTG: Pt will perform furniture transfers with assist:: Contact Guard/Touching assist   Problem: RH Ambulation Goal: LTG Patient will ambulate in controlled environment (PT) Description: LTG: Patient will ambulate in a controlled environment, # of feet with assistance (PT). Flowsheets (Taken 07/06/2021 1748) LTG: Pt will ambulate in controlled environ  assist needed:: Contact Guard/Touching assist LTG: Ambulation distance in controlled environment: at least 100 ft using LRAD Goal: LTG Patient will ambulate in home environment (PT) Description: LTG: Patient will ambulate in home environment, # of feet with assistance (PT). Flowsheets (Taken 07/06/2021 1748) LTG: Pt will ambulate in home environ  assist needed:: Supervision/Verbal cueing LTG: Ambulation distance in home environment: at least 50 ft using LRAD   Problem: RH Stairs Goal: LTG Patient will ambulate up and down stairs w/assist (PT) Description: LTG: Patient will ambulate up and down # of stairs with assistance (PT) Flowsheets (Taken 07/06/2021 1748) LTG: Pt will ambulate up/down stairs assist needed:: Contact Guard/Touching assist LTG: Pt will  ambulate up and down number of stairs: 17 steps using LHR to ascend as per home setup in order to enter/ exit home and reach 2nd floor of home

## 2021-07-07 NOTE — Progress Notes (Signed)
Orthopedic Tech Progress Note Patient Details:  Jared Bridges 12/21/1929 [id number] Ordered collar  Patient ID: Jared Bridges, male   DOB: Oct 21, 1930, 87 y.o.   MRN: [id number]  Jared Bridges 07/07/2021, 2:45 PM

## 2021-07-07 NOTE — Progress Notes (Signed)
PROGRESS NOTE   Subjective/Complaints: Tells me he slept. Pain is controlled. Has been "working" his arms to get them stronger! Tolerating c-collar in bed  ROS: Patient denies fever, rash, sore throat, blurred vision, nausea, vomiting, diarrhea, cough, shortness of breath or chest pain, headache, or mood change.    Objective:   DG Chest 2 View  Result Date: 07/06/2021 CLINICAL DATA:  Chest congestion EXAM: CHEST - 2 VIEW COMPARISON:  11/01/2020 and prior studies FINDINGS: The cardiomediastinal silhouette is unchanged. There is no evidence of focal airspace disease, pulmonary edema, suspicious pulmonary nodule/mass, pleural effusion, or pneumothorax. No acute bony abnormalities are identified. IMPRESSION: No active cardiopulmonary disease. Electronically Signed   By: Margarette Canada M.D.   On: 07/06/2021 08:55   No results for input(s): WBC, HGB, HCT, PLT in the last 72 hours. No results for input(s): NA, K, CL, CO2, GLUCOSE, BUN, CREATININE, CALCIUM in the last 72 hours.  Intake/Output Summary (Last 24 hours) at 07/07/2021 1043 Last data filed at 07/07/2021 0900 Gross per 24 hour  Intake 236 ml  Output --  Net 236 ml        Physical Exam: Vital Signs Blood pressure (!) 95/57, pulse 78, temperature 98.3 F (36.8 C), temperature source Oral, resp. rate 18, height '6\' 2"'$  (1.88 m), weight 98 kg, SpO2 97 %.  General: Alert and oriented x 3, No apparent distress HEENT: Head is normocephalic, atraumatic, PERRLA, EOMI, sclera anicteric, oral mucosa pink and moist, dentition intact, ext ear canals clear,  Neck: Supple without JVD or lymphadenopathy Heart: Reg rate and rhythm. No murmurs rubs or gallops Chest: A little wet soundings without wheezes, rales, or rhonchi; no distress. Sounds are mostly upper airway Abdomen: Soft, non-tender, non-distended, bowel sounds positive. Extremities: No clubbing, cyanosis, or edema. Pulses are 2+ Psych:  Pt's affect is appropriate. Pt is cooperative Skin: old tka incisions Neuro:  Alert and oriented x 3. Normal insight and awareness. Intact Memory. Normal language and speech. Cranial nerve exam unremarkable RUE 5/5 deltoid, biceps, wrist, 3+ to 4- triceps, 3+ to 4- HI. LUE: 5/5 deltoids, biceps, wrist and 3 triceps and 3- HI. LE motor 4- to 4/5 prox to distal. Decreased LT in both upper extremities. DTR's trace Musculoskeletal: poor sitting posture, weaing [place] J collar.    Assessment/Plan: 1. Functional deficits which require 3+ hours per day of interdisciplinary therapy in a comprehensive inpatient rehab setting. Physiatrist is providing close team supervision and 24 hour management of active medical problems listed below. Physiatrist and rehab team continue to assess barriers to discharge/monitor patient progress toward functional and medical goals  Care Tool:  Bathing    Body parts bathed by patient: Face, Left arm, Right arm, Abdomen, Chest   Body parts bathed by helper: Front perineal area, Buttocks, Right upper leg, Left upper leg, Right lower leg, Left lower leg     Bathing assist Assist Level: Moderate Assistance - Patient 50 - 74%     Upper Body Dressing/Undressing Upper body dressing   What is the patient wearing?: Hospital gown only    Upper body assist Assist Level: Maximal Assistance - Patient 25 - 49%    Lower Body Dressing/Undressing Lower  body dressing      What is the patient wearing?: Pants     Lower body assist Assist for lower body dressing: Maximal Assistance - Patient 25 - 49%     Toileting Toileting Toileting Activity did not occur (Clothing management and hygiene only): N/A (no void or bm)  Toileting assist Assist for toileting: Moderate Assistance - Patient 50 - 74%     Transfers Chair/bed transfer  Transfers assist     Chair/bed transfer assist level: Moderate Assistance - Patient 50 - 74%     Locomotion Ambulation   Ambulation  assist   Ambulation activity did not occur: Safety/medical concerns          Walk 10 feet activity   Assist  Walk 10 feet activity did not occur: Safety/medical concerns        Walk 50 feet activity   Assist Walk 50 feet with 2 turns activity did not occur: Safety/medical concerns         Walk 150 feet activity   Assist Walk 150 feet activity did not occur: Safety/medical concerns         Walk 10 feet on uneven surface  activity   Assist Walk 10 feet on uneven surfaces activity did not occur: Safety/medical concerns         Wheelchair     Assist Will patient use wheelchair at discharge?: No Type of Wheelchair: Manual    Wheelchair assist level: Dependent - Patient 0% Max wheelchair distance: 10 ft    Wheelchair 50 feet with 2 turns activity    Assist    Wheelchair 50 feet with 2 turns activity did not occur: Safety/medical concerns   Assist Level: Dependent - Patient 0%   Wheelchair 150 feet activity     Assist  Wheelchair 150 feet activity did not occur: Safety/medical concerns       Blood pressure (!) 95/57, pulse 78, temperature 98.3 F (36.8 C), temperature source Oral, resp. rate 18, height '6\' 2"'$  (1.88 m), weight 98 kg, SpO2 97 %.  Medical Problem List and Plan: 1.  Functional and mobility deficits secondary to fall with C6/7 fx and EDH and subsequent cervical cord compression at that level. He is a C7 Central cord, left side more affected than right.             -patient may not yet shower             -ELOS/Goals: 10-12 days, supervision to min assist             -Therapy reported that collar is uncomfortably forcing him into flexion when he is out of bed. I have asked [employer] to assess for replacement collar which will keep him in more neutral position. 2.  Antithrombotics: -DVT/anticoagulation:  Mechanical: Sequential compression devices, below knee Bilateral lower extremities             -antiplatelet therapy: N/A 3.  Pain Management: Continue tylenol TID with Oxycodone prn 4. Mood: LCSW to follow for evaluation and support/              -antipsychotic agents: N/A 5. Neuropsych: This patient is capable of making decisions on his own behalf. 6. Skin/Wound Care: routine pressure relief measures.  7. Fluids/Electrolytes/Nutrition: Monitor I/O. Check BMET on 08/08 8. HTN: actually soft--observe for now 9. T2DM: Monitor BS ac/hs. Use SSI for elevated BS 10. PMR with peripheral neuropathy: continue Topamax, Lyrica, 11. Prostate cancer w/bony mets:  Treated with XRT 01/2021 at [facility] --  On Xandi with proscar 12. Wet voice: intermittent. Sometimes hard to clear with collar and positioning             -cxr reviewed and is clear  -needs to be in good upright posture with meals and when he's just even rusting             -his injury and edema in the setting of his ankylosis and osteophytes certainly will put him at risk for aspiration  -collar replacement as above 13. H/o depression: ON Effexor.  14. Constipation: no recorded bm since being in the hospital  -sorbitol and SSE today 15. Urinary incontinence:  -timed voids  -check PVR's    LOS: 2 days A FACE TO FACE EVALUATION WAS PERFORMED  Meredith Staggers 07/07/2021, 10:43 AM

## 2021-07-08 ENCOUNTER — Encounter: Payer: Self-pay | Admitting: Physician Assistant

## 2021-07-08 ENCOUNTER — Telehealth: Payer: Medicare Other | Admitting: Physician Assistant

## 2021-07-08 DIAGNOSIS — E7800 Pure hypercholesterolemia, unspecified: Secondary | ICD-10-CM | POA: Diagnosis not present

## 2021-07-08 DIAGNOSIS — F3341 Major depressive disorder, recurrent, in partial remission: Secondary | ICD-10-CM | POA: Diagnosis not present

## 2021-07-08 DIAGNOSIS — S14127D Central cord syndrome at C7 level of cervical spinal cord, subsequent encounter: Secondary | ICD-10-CM | POA: Diagnosis not present

## 2021-07-08 LAB — CBC WITH DIFFERENTIAL/PLATELET
Abs Immature Granulocytes: 0.15 10*3/uL — ABNORMAL HIGH (ref 0.00–0.07)
Basophils Absolute: 0 10*3/uL (ref 0.0–0.1)
Basophils Relative: 0 %
Eosinophils Absolute: 0.6 10*3/uL — ABNORMAL HIGH (ref 0.0–0.5)
Eosinophils Relative: 7 %
HCT: 29.1 % — ABNORMAL LOW (ref 39.0–52.0)
Hemoglobin: 9.3 g/dL — ABNORMAL LOW (ref 13.0–17.0)
Immature Granulocytes: 2 %
Lymphocytes Relative: 7 %
Lymphs Abs: 0.6 10*3/uL — ABNORMAL LOW (ref 0.7–4.0)
MCH: 31.5 pg (ref 26.0–34.0)
MCHC: 32 g/dL (ref 30.0–36.0)
MCV: 98.6 fL (ref 80.0–100.0)
Monocytes Absolute: 1 10*3/uL (ref 0.1–1.0)
Monocytes Relative: 12 %
Neutro Abs: 5.9 10*3/uL (ref 1.7–7.7)
Neutrophils Relative %: 72 %
Platelets: 188 10*3/uL (ref 150–400)
RBC: 2.95 MIL/uL — ABNORMAL LOW (ref 4.22–5.81)
RDW: 13.5 % (ref 11.5–15.5)
WBC: 8.1 10*3/uL (ref 4.0–10.5)
nRBC: 0 % (ref 0.0–0.2)

## 2021-07-08 LAB — GLUCOSE, CAPILLARY
Glucose-Capillary: 235 mg/dL — ABNORMAL HIGH (ref 70–99)
Glucose-Capillary: 218 mg/dL — ABNORMAL HIGH (ref 70–99)
Glucose-Capillary: 200 mg/dL — ABNORMAL HIGH (ref 70–99)
Glucose-Capillary: 165 mg/dL — ABNORMAL HIGH (ref 70–99)

## 2021-07-08 LAB — COMPREHENSIVE METABOLIC PANEL
ALT: 14 U/L (ref 0–44)
AST: 15 U/L (ref 15–41)
Albumin: 2.6 g/dL — ABNORMAL LOW (ref 3.5–5.0)
Alkaline Phosphatase: 63 U/L (ref 38–126)
Anion gap: 6 (ref 5–15)
BUN: 28 mg/dL — ABNORMAL HIGH (ref 8–23)
CO2: 24 mmol/L (ref 22–32)
Calcium: 7.7 mg/dL — ABNORMAL LOW (ref 8.9–10.3)
Chloride: 104 mmol/L (ref 98–111)
Creatinine, Ser: 1.08 mg/dL (ref 0.61–1.24)
GFR, Estimated: >60 mL/min (ref >60)
Glucose, Bld: 238 mg/dL — ABNORMAL HIGH (ref 70–99)
Potassium: 3.7 mmol/L (ref 3.5–5.1)
Sodium: 134 mmol/L — ABNORMAL LOW (ref 135–145)
Total Bilirubin: 0.8 mg/dL (ref 0.3–1.2)
Total Protein: 5.8 g/dL — ABNORMAL LOW (ref 6.5–8.1)

## 2021-07-08 MED ORDER — ATORVASTATIN CALCIUM 40 MG PO TABS: 40.0000 mg | ORAL_TABLET | Freq: Every day | ORAL | 0 refills | Status: DC

## 2021-07-08 MED ORDER — VENLAFAXINE HCL ER 37.5 MG PO CP24: 37.5000 mg | ORAL_CAPSULE | Freq: Every morning | ORAL | 0 refills | Status: DC

## 2021-07-08 NOTE — Progress Notes (Signed)
PROGRESS NOTE   Subjective/Complaints:  Pt reports sleepy this AM Also reports breathing sounds have had for 1 month or more LBM 1-2 days ago- first since admission to rehab and has control per pt.  Got a CXR 2 days ago- also got a new collar- going well.    ROS:  Pt denies SOB, abd pain, CP, N/V/C/D, and vision changes   Objective:   No results found. Recent Labs    07/08/21 0527  WBC 8.1  HGB 9.3*  HCT 29.1*  PLT 188   Recent Labs    07/08/21 0527  NA 134*  K 3.7  CL 104  CO2 24  GLUCOSE 238*  BUN 28*  CREATININE 1.08  CALCIUM 7.7*    Intake/Output Summary (Last 24 hours) at 07/08/2021 2012 Last data filed at 07/08/2021 1700 Gross per 24 hour  Intake 470 ml  Output --  Net 470 ml        Physical Exam: Vital Signs Blood pressure 140/63, pulse 67, temperature 98.5 F (36.9 C), resp. rate 18, height '6\' 2"'$  (1.88 m), weight 98 kg, SpO2 99 %.   General: awake, alert, appropriate, laying supine in bed; slightly slouched; sleepy NAD HENT: conjugate gaze; oropharynx moist CV: regular rate; no JVD Pulmonary: diffuse rhonchi- throughout lungs and upper airway sounds as well- feels "the same" as 2 days ago- when CXR (-).  GI: soft, NT, ND, (+)BS Psychiatric: appropriate but sleepy Neurological: alert  Abdomen: Soft, non-tender, non-distended, bowel sounds positive. Extremities: No clubbing, cyanosis, or edema. Pulses are 2+ Psych: Pt's affect is appropriate. Pt is cooperative Skin: old tka incisions Neuro:  Alert and oriented x 3. Normal insight and awareness. Intact Memory. Normal language and speech. Cranial nerve exam unremarkable RUE 5/5 deltoid, biceps, wrist, 3+ to 4- triceps, 3+ to 4- HI. LUE: 5/5 deltoids, biceps, wrist and 3 triceps and 3- HI. LE motor 4- to 4/5 prox to distal. Decreased LT in both upper extremities. DTR's trace Musculoskeletal: poor sitting posture, weaing [place] J collar.     Assessment/Plan: 1. Functional deficits which require 3+ hours per day of interdisciplinary therapy in a comprehensive inpatient rehab setting. Physiatrist is providing close team supervision and 24 hour management of active medical problems listed below. Physiatrist and rehab team continue to assess barriers to discharge/monitor patient progress toward functional and medical goals  Care Tool:  Bathing    Body parts bathed by patient: Face, Left arm, Right arm, Abdomen, Chest   Body parts bathed by helper: Front perineal area, Buttocks, Right upper leg, Left upper leg, Right lower leg, Left lower leg     Bathing assist Assist Level: Moderate Assistance - Patient 50 - 74%     Upper Body Dressing/Undressing Upper body dressing   What is the patient wearing?: Pull over shirt    Upper body assist Assist Level: Maximal Assistance - Patient 25 - 49%    Lower Body Dressing/Undressing Lower body dressing      What is the patient wearing?: Pants     Lower body assist Assist for lower body dressing: Maximal Assistance - Patient 25 - 49%     Toileting Toileting Toileting Activity did not  occur Landscape architect and hygiene only): N/A (no void or bm)  Toileting assist Assist for toileting: Moderate Assistance - Patient 50 - 74%     Transfers Chair/bed transfer  Transfers assist     Chair/bed transfer assist level: Moderate Assistance - Patient 50 - 74%     Locomotion Ambulation   Ambulation assist   Ambulation activity did not occur: Safety/medical concerns          Walk 10 feet activity   Assist  Walk 10 feet activity did not occur: Safety/medical concerns        Walk 50 feet activity   Assist Walk 50 feet with 2 turns activity did not occur: Safety/medical concerns         Walk 150 feet activity   Assist Walk 150 feet activity did not occur: Safety/medical concerns         Walk 10 feet on uneven surface  activity   Assist Walk  10 feet on uneven surfaces activity did not occur: Safety/medical concerns         Wheelchair     Assist Will patient use wheelchair at discharge?: No Type of Wheelchair: Manual    Wheelchair assist level: Dependent - Patient 0% Max wheelchair distance: 10 ft    Wheelchair 50 feet with 2 turns activity    Assist    Wheelchair 50 feet with 2 turns activity did not occur: Safety/medical concerns   Assist Level: Dependent - Patient 0%   Wheelchair 150 feet activity     Assist  Wheelchair 150 feet activity did not occur: Safety/medical concerns       Blood pressure 140/63, pulse 67, temperature 98.5 F (36.9 C), resp. rate 18, height '6\' 2"'$  (1.88 m), weight 98 kg, SpO2 99 %.  Medical Problem List and Plan: 1.  Functional and mobility deficits secondary to fall with C6/7 fx and EDH and subsequent cervical cord compression at that level. He is a C7 Central cord, left side more affected than right.             -patient may not yet shower             -ELOS/Goals: 10-12 days, supervision to min assist             -Therapy reported that collar is uncomfortably forcing him into flexion when he is out of bed. I have asked [employer] to assess for replacement collar which will keep him in more neutral position.  -con't PT and OT- CIR 2.  Antithrombotics: -DVT/anticoagulation:  Mechanical: Sequential compression devices, below knee Bilateral lower extremities             -antiplatelet therapy: N/A 3. Pain Management: Continue tylenol TID with Oxycodone prn 4. Mood: LCSW to follow for evaluation and support/              -antipsychotic agents: N/A 5. Neuropsych: This patient is capable of making decisions on his own behalf. 6. Skin/Wound Care: routine pressure relief measures.  7. Fluids/Electrolytes/Nutrition: Monitor I/O. Check BMET on 08/08 8. HTN: actually soft--observe for now 9. T2DM: Monitor BS ac/hs. Use SSI for elevated BS  8/8- BG's 195-235- if continues 1 more day,  will change meds 10. PMR with peripheral neuropathy: continue Topamax, Lyrica, 11. Prostate cancer w/bony mets:  Treated with XRT 01/2021 at [facility] --On Xandi with proscar 12. Wet voice: intermittent. Sometimes hard to clear with collar and positioning             -  cxr reviewed and is clear  -needs to be in good upright posture with meals and when he's just even rusting             -his injury and edema in the setting of his ankylosis and osteophytes certainly will put him at risk for aspiration  -collar replacement as above  8/8- sounds like has rhonchi- will order flutter valve and have him sitting up for all meals-  13. H/o depression: ON Effexor.  14. Constipation: no recorded bm since being in the hospital  -sorbitol and SSE today  8/8- LBM yesterday 15. Urinary incontinence:  -timed voids  -check PVR's    LOS: 3 days A FACE TO FACE EVALUATION WAS PERFORMED  Jared Bridges 07/08/2021, 8:12 PM

## 2021-07-08 NOTE — IPOC Note (Signed)
Overall Plan of Care [facility]) Patient Details Name: BROOKLYN HEY MRN: [id number] DOB: Apr 15, 1930  Admitting Diagnosis: Central cord syndrome at C7 level of cervical spinal cord [facility])  Hospital Problems: Principal Problem:   Central cord syndrome at C7 level of cervical spinal cord [facility])     Functional Problem List: Nursing Bladder, Bowel, Endurance, Medication Management, Motor, Pain, Perception, Safety, Sensory, Skin Integrity  PT Balance, Endurance, Motor, Pain, Safety, Sensory  OT Balance, Safety, Sensory, Skin Integrity, Endurance, Motor, Pain  SLP    TR         Basic ADL's: OT Grooming, Bathing, Dressing, Toileting     Advanced  ADL's: OT       Transfers: PT Bed Mobility, Bed to Chair, Car, [employer]  OT Tub/Shower     Locomotion: PT Ambulation, Stairs     Additional Impairments: OT Fuctional Use of Upper Extremity  SLP        TR      Anticipated Outcomes Item Anticipated Outcome  Self Feeding N/A  Swallowing      Basic self-care  min assist- supervision  Toileting  min assist   Bathroom Transfers supervision  Bowel/Bladder  Pt will have regular BM every 3 days or less. Pt will regain urinary continence  Transfers  supervision/ CGA  Locomotion  CGA  Communication     Cognition     Pain  Pt will have a pain rating score of <3/10  Safety/Judgment  Pt will demonstrate ability to be safe   Therapy Plan: PT Intensity: Minimum of 1-2 x/day ,45 to 90 minutes PT Frequency: 5 out of 7 days PT Duration Estimated Length of Stay: 10-14 days OT Intensity: Minimum of 1-2 x/day, 45 to 90 minutes OT Frequency: 5 out of 7 days OT Duration/Estimated Length of Stay: 12-14 days     Due to the current state of emergency, patients may not be receiving their 3-hours of Medicare-mandated therapy.   Team Interventions: Nursing Interventions Patient/Family Education, Bladder Management, Bowel Management, Pain Management, Medication Management, Skin Care/Wound  Management, Discharge Planning  PT interventions Ambulation/gait training, Balance/vestibular training, Discharge planning, Community reintegration, Disease management/prevention, DME/adaptive equipment instruction, Functional mobility training, Neuromuscular re-education, Pain management, Psychosocial support, Therapeutic Exercise, Therapeutic Activities, UE/LE Coordination activities, UE/LE Strength taining/ROM, Wheelchair propulsion/positioning, Stair training, Patient/family education  OT Interventions Balance/vestibular training, Neuromuscular re-education, Self Care/advanced ADL retraining, Therapeutic Exercise, DME/adaptive equipment instruction, Pain management, UE/LE Strength taining/ROM, Skin care/wound managment, Wheelchair propulsion/positioning, Community reintegration, Technical sales engineer stimulation, Patient/family education, Splinting/orthotics, UE/LE Coordination activities, Discharge planning, Functional mobility training, Psychosocial support, Therapeutic Activities  SLP Interventions    TR Interventions    SW/CM Interventions Discharge Planning, Psychosocial Support, Patient/Family Education   Barriers to Discharge MD  Medical stability, Home enviroment access/loayout, New diabetic, Incontinence, Neurogenic bowel and bladder, Wound care, Lack of/limited family support, and Weight bearing restrictions  Nursing Incontinence, Medication compliance    PT Inaccessible home environment, Decreased caregiver support, Home environment access/layout, Lack of/limited family support, Insurance for SNF coverage, Other (comments) (C-collar and movement restrictions)    OT      SLP      SW       Team Discharge Planning: Destination: PT-Home ,OT- Home , SLP-  Projected Follow-up: PT-Home health PT, OT-  Other (comment) (TBD), SLP-  Projected Equipment Needs: PT-To be determined, OT- To be determined, SLP-  Equipment Details: PT- , OT-  Patient/family involved in discharge planning: PT-  Patient,  OT-Patient, SLP-   MD ELOS: 10-14 days?  Medical Rehab Prognosis:  Good Assessment: Pt is an 87 yr old male with prostate CA with mets s/p XRT with central cord syndrome at C7- incomplete quadriplegia- with neurogenic bowel and bladder-  Goals min A to set up    See Team Conference Notes for weekly updates to the plan of care

## 2021-07-08 NOTE — Care Management (Signed)
Inpatient Rehabilitation Center Individual Statement of Services  Patient Name:  Jared Bridges  Date:  07/08/2021  Welcome to the [facility].  Our goal is to provide you with an individualized program based on your diagnosis and situation, designed to meet your specific needs.  With this comprehensive rehabilitation program, you will be expected to participate in at least 3 hours of rehabilitation therapies Monday-Friday, with modified therapy programming on the weekends.  Your rehabilitation program will include the following services:  Physical Therapy (PT), Occupational Therapy (OT), 24 hour per day rehabilitation nursing, Therapeutic Recreaction (TR), Psychology, Neuropsychology, Care Coordinator, Rehabilitation Medicine, [place], and Other  Weekly team conferences will be held on Tuesdays to discuss your progress.  Your Inpatient Rehabilitation Care Coordinator will talk with you frequently to get your input and to update you on team discussions.  Team conferences with you and your family in attendance may also be held.  Expected length of stay: 10-14 days     Overall anticipated outcome: Supervision  Depending on your progress and recovery, your program may change. Your Inpatient Rehabilitation Care Coordinator will coordinate services and will keep you informed of any changes. Your Inpatient Rehabilitation Care Coordinator's name and contact numbers are listed  below.  The following services may also be recommended but are not provided by the [facility] will be made to provide these services after discharge if needed.  Arrangements include referral to agencies that provide these services.  Your insurance has been verified to be:  [facility] Medicare  Your primary doctor is:  Technical brewer  Pertinent information will be shared with your doctor and your insurance company.  Inpatient Rehabilitation Care Coordinator:  Cathleen Corti [phone] or [phone]  Information discussed with and copy given to patient by: Rana Snare, 07/08/2021, 9:29 AM

## 2021-07-08 NOTE — Progress Notes (Signed)
Inpatient Rehabilitation Care Coordinator Assessment and Plan Patient Details  Name: Jared Bridges MRN: [id number] Date of Birth: 02-27-1930  Today's Date: 07/08/2021  Hospital Problems: Principal Problem:   Central cord syndrome at C7 level of cervical spinal cord [facility])  Past Medical History:  Past Medical History:  Diagnosis Date   Anemia, iron deficiency    Chronic headaches    Migraines   Chronic kidney disease    Resolved   Depression    Diverticulitis    Essential hypertension    GI bleed    Dr. Laural Golden - 1998   Hypercholesteremia    Orthostatic hypotension    Peptic ulcer disease    Peripheral neuropathy    Polymyalgia rheumatica ([place])    Stroke ([place])    Residual trouble reading and writing   Syncope    Thyroid nodule    TIA (transient ischemic attack) 12/2017   Type 2 diabetes mellitus ([place])    Past Surgical History:  Past Surgical History:  Procedure Laterality Date   APPENDECTOMY     as child   BACK SURGERY     2000 IDET SPINAL PROC   CHOLECYSTECTOMY N/A 04/07/2016   Procedure: CHOLECYSTECTOMY;  Surgeon: Aviva Signs, MD;  Location: [facility] ORS;  Service: General;  Laterality: N/A;   [place]   COLONOSCOPY N/A 01/07/2018   Procedure: COLONOSCOPY;  Surgeon: Rogene Houston, MD;  Location: [facility] ENDO SU[facility];  Service: Endoscopy;  Laterality: N/A;  1:00   CRYOTHERAPY     EYE SURGERY     right cataract with lens implant   FEMUR SURGERY     RT LEG        1953   HAND SURGERY     RIGHT   HERNIA REPAIR     1-right inguinal, 3- left inguinal   IRRIGATION AND DEBRIDEMENT KNEE Left 07/09/2015   Procedure: LEFT KNEE IRRIGATION AND DEBRIDEMENT WOUND CLOSURE;  Surgeon: Gaynelle Arabian, MD;  Location: [facility];  Service: Orthopedics;  Laterality: Left;   JOINT REPLACEMENT  02/28/2016   lt knee revision   KNEE DEBRIDEMENT     2016   LEFT   MENISCUS REPAIR     LEFT   POLYPECTOMY  01/07/2018   Procedure: POLYPECTOMY;  Surgeon: Rogene Houston,  MD;  Location: [facility];  Service: Endoscopy;;  colon    SHOULDER OPEN ROTATOR CUFF REPAIR     RT SHOULDER   SPINAL FUSION     SPINAL FUSION     TONSILLECTOMY     TOTAL KNEE ARTHROPLASTY Left 05/28/2015   Procedure: LEFT TOTAL KNEE ARTHROPLASTY;  Surgeon: Gaynelle Arabian, MD;  Location: [facility];  Service: Orthopedics;  Laterality: Left;   TOTAL KNEE REVISION Left 02/28/2016   Procedure: LEFT TOTAL KNEE REVISION;  Surgeon: Leandrew Koyanagi, MD;  Location: [place];  Service: Orthopedics;  Laterality: Left;   TRIGGER FINGER RELEASE     RT HAND   1990S   UVULECTOMY  2004   TURBINATE,TONSIL,ADENOID   Social History:  reports that he quit smoking about 34 years ago. His smoking use included cigarettes. He started smoking about 62 years ago. He has a 1.80 pack-year smoking history. He has never used smokeless tobacco. He reports that he does not drink alcohol and does not use drugs.  Family / Support Systems Marital Status: Married How Long?: 63 year Patient Roles: Spouse, Parent Children: Emiluy (dtr; only child) Other Supports: None reported Anticipated  Caregiver: Primary caregiver pt dtr [name] Ability/Limitations of Caregiver: None reported Caregiver Availability: 24/7 Family Dynamics: Pt lives with his wife and their dtr  Social History Preferred language: English Religion: Protestant Cultural Background: Pt worked as a Scientific laboratory technician. Education: some college Read: Yes Write: Yes Employment Status: Retired Date Retired/Disabled/Unemployed: 2012 Age Retired: 81 Public relations account executive Issues: Denies Guardian/Conservator: N/A   Abuse/Neglect Abuse/Neglect Assessment Can Be Completed: Yes Physical Abuse: Denies Verbal Abuse: Denies Sexual Abuse: Denies Exploitation of patient/patient's resources: Denies Self-Neglect: Denies  Emotional Status Pt's affect, behavior and adjustment status: Pt in good spirits at time of visit. Recent Psychosocial Issues: Denies Psychiatric  History: Denies Substance Abuse History: Pt states he smoked cigarettes when he was in [employer]. Admits to rare use of EtOH. Denies any other use.  Patient / Family Perceptions, Expectations & Goals Pt/Family understanding of illness & functional limitations: Pt has general understanding of care needs Premorbid pt/family roles/activities: Some assistance at bedtime, however primarily independent. Anticipated changes in roles/activities/participation: Assistance with ADLs/IADls Pt/family expectations/goals: Pt goal is to work on using his hands such as writing his signature, and performing some of his own care needs. Pt states that he has a bidet that he intends to install for when he returns home.  Community Resources [employer] Scripts: None Premorbid Home Care/DME Agencies: None Transportation available at discharge: Dtr [employer] referrals recommended: Neuropsychology  Discharge Planning Living Arrangements: Spouse/significant other, Children Support Systems: Spouse/significant other, Children Type of Residence: Private residence Administrator, sports: Multimedia programmer (specify) ([place] Medicare) Financial Resources: Radio broadcast assistant Screen Referred: No Living Expenses: Own Money Management: Other (Comment) (dtr [name]) Does the patient have any problems obtaining your medications?: No Home Management: Dtr [name] helps aide with homecare needs Patient/Family Preliminary Plans: No changes Care Coordinator Barriers to Discharge: Decreased caregiver support, Lack of/limited family support Care Coordinator Anticipated Follow Up Needs: HH/OP Expected length of stay: 10-14 days  Clinical Impression SW met with pt in room to introduce self, explain role, and discuss discharge process. HCPOA is [name]. MOST form on file. 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 DME: rollator, 2 Rws, w/c, 3in1 BSC. Pt aware SW will f.u with [name].   Rana Snare 07/08/2021,  9:49 PM

## 2021-07-08 NOTE — Progress Notes (Signed)
Meredith Staggers, MD   Physician  Physical Medicine and Rehabilitation  PMR Pre-admission     Signed  Date of Service:  07/05/2021 12:26 PM       Related encounter: ED to Hosp-Admission (Discharged) from 07/01/2021 in [place]          Show:Clear all '[x]' Written'[x]' Templated'[]' Copied  Added by: '[x]' Cristina Gong, RN'[x]' Meredith Staggers, MD   '[]' Hover for details                                                                                                                                                                                                                                                                                                                                                                                                                                               PMR Admission Coordinator Pre-Admission Assessment   Patient: Jared Bridges is an 87 y.o., male MRN: [id number] DOB: [birth year] Height: '6\' 2"'  (188 cm) Weight: 95 kg yes Insurance Information HMO:     PPO:      PCP:      IPA:      80/20:      OTHER: PRIMARY: [employer] Medicare      Policy#: [id number]      Subscriber: pt CM Name: Randa Spike  Phone#:       [phone] OPTION 7     Fax#: [phone] Pre-Cert#: [id number]  approved for 7 days    Employer: Benefits:  Phone #: [phone]     Name: 8/3 Eff. Date: 12/01/2020     Deduct: none      Out of Pocket Max: $3600      Life Max: none CIR: $295 co pay per days days 1 until 5      SNF: no copay days 1 until 20; $188 co pay per day days 21 until 40; no copay days 41 until 100 Outpatient: $30 per visit     Co-Pay: visits per medical neccesity Home Health: 100%      Co-Pay: visits per medical neccesity DME: 80%     Co-Pay: 20% Providers: in  network  SECONDARY: none   Financial Counselor:       Phone#:   [employer] Engineer, [employer]" for patients in Inpatient Rehabilitation Facilities with attached "Privacy Act [place] Records" was provided and verbally reviewed with: Patient and Family   Emergency Contact Information Contact Information       Name Relation Home Work [place] Daughter     [phone]           Current Medical History  Patient Admitting Diagnosis: Fall, C6/C7 Fx with epidural hematoma   History of Present Illness: 87 year old male with history of prostate cancer, bony mets, Type 2 DM, HTN, Dyslipidemia, and HOH. Present via [facility] on 07/01/2021 due to mechanical fall. Had been to ED in past 24 hrs and discharged home, but returned due to upper extremity weakness numbness and pain. Transferred to [facility] for neurosurgery evaluation.   Dr Christella Noa recommended non operative management, Aspen collar, and therapy. Type 2 DM stable on sliding scale insulin. Continue Xtandi for metastatic prostate cancer and Proscar for BPH. DVT prophylaxis with SCDs.    Patient's medical record from [facility]  has been reviewed by the rehabilitation admission coordinator and physician.   Past Medical History      Past Medical History:  Diagnosis Date   Anemia, iron deficiency     Chronic headaches      Migraines   Chronic kidney disease      Resolved   Depression     Diverticulitis     Essential hypertension     GI bleed      Dr. Laural Golden - 1998   Hypercholesteremia     Orthostatic hypotension     Peptic ulcer disease     Peripheral neuropathy     Polymyalgia rheumatica ([name]     Stroke ([name]      Residual trouble reading and writing   Syncope     Thyroid nodule     TIA (transient ischemic attack) 12/2017   Type 2 diabetes mellitus ([place])        Family History   family history includes Colon cancer in his sister; Depression in his brother and daughter; Hypertension in  an other family member.   Prior Rehab/Hospitalizations Has the patient had prior rehab or hospitalizations prior to admission? Yes   Has the patient had major surgery during 100 days prior to admission? No               Current Medications   Current Facility-Administered Medications:   acetaminophen (TYLENOL) tablet 650 mg, 650 mg, Oral, TID, Domenic Polite, MD, 650 mg at 07/05/21 1026   alfuzosin (UROXATRAL) 24  hr tablet 10 mg, 10 mg, Oral, QPM, Hall, Carole N, DO, 10 mg at 07/04/21 1700   atorvastatin (LIPITOR) tablet 40 mg, 40 mg, Oral, Daily, Irene Pap N, DO, 40 mg at 07/05/21 1026   cholecalciferol (VITAMIN D3) tablet 5,000 Units, 5,000 Units, Oral, QHS, Hall, Carole N, DO, 5,000 Units at 07/04/21 2142   enzalutamide (XTANDI) tablet 80 mg, 80 mg, Oral, QPM, Domenic Polite, MD, 80 mg at 07/04/21 2053   finasteride (PROSCAR) tablet 5 mg, 5 mg, Oral, Daily, Hall, Carole N, DO, 5 mg at 07/05/21 1026   insulin aspart (novoLOG) injection 0-9 Units, 0-9 Units, Subcutaneous, TID WC, Domenic Polite, MD, 5 Units at 07/05/21 1238   ondansetron [facility]) injection 4 mg, 4 mg, Intravenous, Q6H PRN, Domenic Polite, MD   oxyCODONE (Oxy IR/ROXICODONE) immediate release tablet 5 mg, 5 mg, Oral, Q6H PRN, Domenic Polite, MD, 5 mg at 07/04/21 1243   pantoprazole (PROTONIX) EC tablet 20 mg, 20 mg, Oral, Daily, Hall, Carole N, DO, 20 mg at 07/05/21 1026   pregabalin (LYRICA) capsule 75 mg, 75 mg, Oral, BID, Hall, Carole N, DO, 75 mg at 07/05/21 1026   senna-docusate (Senokot-S) tablet 1 tablet, 1 tablet, Oral, BID, Domenic Polite, MD, 1 tablet at 07/05/21 1026   topiramate (TOPAMAX) tablet 50 mg, 50 mg, Oral, BID, Hall, Carole N, DO, 50 mg at 07/05/21 1026   venlafaxine XR (EFFEXOR-XR) 24 hr capsule 37.5 mg, 37.5 mg, Oral, q morning, Hall, Carole N, DO, 37.5 mg at 07/05/21 1026   vitamin B-12 (CYANOCOBALAMIN) tablet 1,000 mcg, 1,000 mcg, Oral, Daily, Hall, Carole N, DO, 1,000 mcg at 07/05/21 1026    Patients Current Diet:  Diet Order                  Diet regular Room service appropriate? Yes with Assist; Fluid consistency: Thin  Diet effective now                       Precautions / Restrictions Precautions Precautions: Fall, Cervical Precaution Booklet Issued: No Cervical Brace: Hard collar, At all times Restrictions Weight Bearing Restrictions: No    Has the patient had 2 or more falls or a fall with injury in the past year? Yes   Prior Activity Level Limited Community (1-2x/wk): Mod I with Rollator; receiving Outpatient Therapy   Prior Functional Level Self Care: Did the patient need help bathing, dressing, using the toilet or eating? Needed some help   Indoor Mobility: Did the patient need assistance with walking from room to room (with or without device)? Independent   Stairs: Did the patient need assistance with internal or external stairs (with or without device)? Needed some help   Functional Cognition: Did the patient need help planning regular tasks such as shopping or remembering to take medications? Independent   Home Assistive Devices / Equipment Home Assistive Devices/Equipment: None Home Equipment: Walker - 2 wheels, Walker - 4 wheels, Shower seat   Prior Device Use: Indicate devices/aids used by the patient prior to current illness, exacerbation or injury?  rollator   Current Functional Level Cognition   Overall Cognitive Status: Impaired/Different from baseline Orientation Level: Oriented X4 Following Commands: Follows one step commands inconsistently, Follows one step commands with increased time Safety/Judgement: Decreased awareness of safety General Comments: Pt alert and oriented however was unaware of being at [facility]; knew he had been at [facility]    Extremity Assessment (includes Sensation/Coordination)   Upper Extremity Assessment: Defer to  OT evaluation RUE Deficits / Details: R hand numbness; limited shoulder ROM; interferring with  function; will further assess LUE Deficits / Details: L hand numbness; limited shoulder ROM; interferring with function  Lower Extremity Assessment: Generalized weakness, RLE deficits/detail, LLE deficits/detail RLE Deficits / Details: Reporting numbness from feet to knee; but reports this has been going on for awhile LLE Deficits / Details: Reporting numbness from feet to knee, but reports this has been going on for a long time     ADLs   Overall ADL's : Needs assistance/impaired Eating/Feeding: Set up, Sitting Eating/Feeding Details (indicate cue type and reason): in recliner, using regular spoon for eating applesauce Grooming: Moderate assistance Upper Body Bathing: Moderate assistance Lower Body Bathing: Maximal assistance, Sit to/from stand Upper Body Dressing : Moderate assistance Lower Body Dressing: Maximal assistance, Sit to/from stand Toilet Transfer: Minimal assistance, +2 for safety/equipment, RW, Ambulation Toilet Transfer Details (indicate cue type and reason): simulated bed>10 feet> sit in recliner>10 feet>sit in recliner Toileting- Clothing Manipulation and Hygiene: Maximal assistance Toileting - Clothing Manipulation Details (indicate cue type and reason): wears brief; incontinence? Functional mobility during ADLs: Moderate assistance, Cueing for safety, +2 for physical assistance     Mobility   Overal bed mobility: Needs Assistance Bed Mobility: Rolling, Sidelying to Sit Rolling: Min assist Sidelying to sit: Mod assist Sit to sidelying: Max assist, +2 for physical assistance General bed mobility comments: Required assist for trunk and LEs. Cues for log roll technique     Transfers   Overall transfer level: Needs assistance Equipment used: Rolling walker (2 wheeled) Transfers: Sit to/from Stand Sit to Stand: Min assist, +2 physical assistance General transfer comment: from raised bed and recliner; VCs for safe hand placement     Ambulation / Gait / Stairs /  Wheelchair Mobility   Ambulation/Gait Ambulation/Gait assistance: Min assist, +2 safety/equipment Gait Distance (Feet): 12 Feet (+ 10 ft) Assistive device: Rolling walker (2 wheeled) Gait Pattern/deviations: Step-to pattern General Gait Details: pt with significant ER with RLE, slight hyperextension in LLE when cued to increase stride length. Gait velocity: decreased     Posture / Balance Balance Overall balance assessment: Needs assistance Sitting-balance support: Feet supported, Bilateral upper extremity supported Sitting balance-Leahy Scale: Poor Standing balance support: Bilateral upper extremity supported Standing balance-Leahy Scale: Poor Standing balance comment: Reliant on external support and RW     Special needs/care consideration Fall precautions    Previous Home Environment  Living Arrangements: Spouse/significant other (daughter)  Lives With: Spouse, Daughter Available Help at Discharge: Family, Available 24 hours/day Type of Home: House Home Layout: Two level, Bed/bath upstairs Alternate Level Stairs-Rails: Right Alternate Level Stairs-Number of Steps: 17 Home Access: Stairs to enter Entrance Stairs-Rails: Right, Left, Can reach both Entrance Stairs-Number of Steps: 5 Bathroom Shower/Tub: Chiropodist: Handicapped height Bathroom Accessibility: Yes How Accessible: Accessible via walker Home Care Services:  (was receiving outpatient therapy) Additional Comments: Patient's home environment requires extensive stair negotiation to enter home and to access primary bedroom/bathroom on 2nd floor of house   Discharge Living Setting Plans for Discharge Living Setting: Patient's home, Lives with (comment) (wife and daughter) Type of Home at Discharge: House Discharge Home Layout: Two level, Bed/bath upstairs Alternate Level Stairs-Rails: Right Alternate Level Stairs-Number of Steps: 17 Discharge Home Access: Stairs to enter Entrance Stairs-Rails:  Right, Left, Can reach both Entrance Stairs-Number of Steps: 5 Discharge Bathroom Shower/Tub: Tub/shower unit Discharge Bathroom Toilet: Handicapped height Discharge Bathroom Accessibility: Yes How Accessible: Accessible via walker Does the  patient have any problems obtaining your medications?: No   Social/Family/Support Systems Patient Roles: Spouse, Parent Contact Information: Daughter, [name] Anticipated Caregiver: wife and daughter Anticipated Caregiver's Contact Information: see above Ability/Limitations of Caregiver: daughter is a non practicing MD Caregiver Availability: 24/7 Discharge Plan Discussed with Primary Caregiver: Yes Is Caregiver In Agreement with Plan?: Yes Does Caregiver/Family have Issues with Lodging/Transportation while Pt is in Rehab?: No   Goals Patient/Family Goal for Rehab: supervision to min assist with PT and OT Expected length of stay: ELOS 10 to 12 days Pt/Family Agrees to Admission and willing to participate: Yes Program Orientation Provided & Reviewed with Pt/Caregiver Including Roles  & Responsibilities: Yes   Decrease burden of Care through IP rehab admission: n/a   Possible need for SNF placement upon discharge: not anticipated   Patient Condition: I have reviewed medical records from [facility] , spoken with CM, and patient and daughter. I met with patient at the bedside for inpatient rehabilitation assessment.  Patient will benefit from ongoing PT and OT, can actively participate in 3 hours of therapy a day 5 days of the week, and can make measurable gains during the admission.  Patient will also benefit from the coordinated team approach during an Inpatient Acute Rehabilitation admission.  The patient will receive intensive therapy as well as Rehabilitation physician, nursing, social worker, and care management interventions.  Due to bladder management, bowel management, safety, skin/wound care, disease management, medication administration,  pain management, and patient education the patient requires 24 hour a day rehabilitation nursing.  The patient is currently min to mod assist overall with mobility and basic ADLs.  Discharge setting and therapy post discharge at home with home health is anticipated.  Patient has agreed to participate in the Acute Inpatient Rehabilitation Program and will admit today.   Preadmission Screen Completed By: Danne Baxter with updates by  Cleatrice Burke, 07/05/2021 4:21 PM ______________________________________________________________________   Discussed status with Dr. Naaman Plummer on 07/05/2021 at 1620 and received approval for admission today.   Admission Coordinator: Danne Baxter RN MSN Cleatrice Burke, [place], time 1610 Date 07/05/2021    Assessment/Plan: Diagnosis: c6,7 fx's with edh after fall.  Does the need for close, 24 hr/day Medical supervision in concert with the patient's rehab needs make it unreasonable for this patient to be served in a less intensive setting? Yes Co-Morbidities requiring supervision/potential complications: prostate cancer with mets, dm, htn Due to bladder management, bowel management, safety, skin/wound care, disease management, medication administration, pain management, and patient education, does the patient require 24 hr/day rehab nursing? Yes Does the patient require coordinated care of a physician, rehab nurse, PT, OT to address physical and functional deficits in the context of the above medical diagnosis(es)? Yes Addressing deficits in the following areas: balance, endurance, locomotion, strength, transferring, bowel/bladder control, bathing, dressing, feeding, grooming, toileting, and psychosocial support Can the patient actively participate in an intensive therapy program of at least 3 hrs of therapy 5 days a week? Yes The potential for patient to make measurable gains while on inpatient rehab is excellent Anticipated functional outcomes upon discharge  from inpatient rehab: supervision and min assist PT, supervision and min assist OT, n/a SLP Estimated rehab length of stay to reach the above functional goals is: 10-12 days Anticipated discharge destination: Home 10. Overall Rehab/Functional Prognosis: excellent     MD Signature: Meredith Staggers, MD, [place] [facility] 07/05/2021           Revision  History                             Note Details  Author Meredith Staggers, MD File Time 07/05/2021  4:41 PM  Author Type Physician Status Signed  Last Editor Meredith Staggers, MD Service Physical Medicine and [place] # [id number] Admit Date 07/05/2021

## 2021-07-08 NOTE — Progress Notes (Signed)
Physical Therapy Session Note  Patient Details  Name: Jared Bridges MRN: [id number] Date of Birth: 16-Mar-1930  Today's Date: 07/08/2021 PT Individual Time: 1145-1230 PT Individual Time Calculation (min): 45 min   Short Term Goals: Week 1:  PT Short Term Goal 1 (Week 1): Pt will perform bed mobility with consistent Min A following cervical/ spinal precautions. PT Short Term Goal 2 (Week 1): Pt will perform STS with Min A using RW. PT Short Term Goal 3 (Week 1): Pt will perform SPVT with consistent Min A and reduced pain using RW. PT Short Term Goal 4 (Week 1): Pt will ambulate at least 20 feet with Min A using RW. Week 2:     Skilled Therapeutic Interventions/Progress Updates:  Pain:  Pt reports cervical pain, UE discomfort of neurogenic nature, no numbers given.  Treatment to tolerance.  Rest breaks and repositioning as needed. Supportive seating system provided/TIS for positional changes/postural relief.  Pt initially supine and agreeable to treatment session w/focus on improving sitting tolerance via improved wc seating/positioning.  Noted no change to cervical collar at this time.  Therapist obtained TIS wc.  Pt supine to sit w/mod assist, additional time, pt uses RUE to provide additional support to anterior side of ccollar.  Pt w/post tendency in sitting.  Mod assist to scoot to edge of bed, firmly position Les on floor.  Once seated adequately, cga for static balance.   Sit to stand w/mod assist, stand pivot transfer to wc w/heavy mod assist, difficulty advancing R foot, leans anterior/R, mod assist to control descent.    Wc required extensive adjustement to position of back and headrest to provide adequte support dut to very significant forward head/kyphosis.  Armrests, legrests also adjusted for optimal positioning.   Pt titled partially for comfort.  Pt then handed over to nursing, educated on tilt feature of wc, pt notified to contact nursing if wc positional changes needed/tilt  adjusted and nursing agreeable. Nursing setting up lunch for pt to eat while up in wc.   Therapy Documentation Precautions:  Precautions Precautions: Cervical, Fall Required Braces or Orthoses: Cervical Brace Cervical Brace: Hard collar, At all times Restrictions Weight Bearing Restrictions: No     Therapy/Group: Individual Therapy [name], PT   Jerrilyn Cairo 07/08/2021, 3:10 PM

## 2021-07-08 NOTE — Progress Notes (Signed)
Virtual Visit Consent   [name], you are scheduled for a virtual visit with a [facility] provider today.     Just as with appointments in the office, your consent must be obtained to participate.  Your consent will be active for this visit and any virtual visit you may have with one of our providers in the next 365 days.     If you have a MyChart account, a copy of this consent can be sent to you electronically.  All virtual visits are billed to your insurance company just like a traditional visit in the office.    As this is a virtual visit, video technology does not allow for your provider to perform a traditional examination.  This may limit your provider's ability to fully assess your condition.  If your provider identifies any concerns that need to be evaluated in person or the need to arrange testing (such as labs, EKG, etc.), we will make arrangements to do so.     Although advances in technology are sophisticated, we cannot ensure that it will always work on either your end or our end.  If the connection with a video visit is poor, the visit may have to be switched to a telephone visit.  With either a video or telephone visit, we are not always able to ensure that we have a secure connection.     I need to obtain your verbal consent now.   Are you willing to proceed with your visit today?    [name] has provided verbal consent on 07/08/2021 for a virtual visit (video or telephone).   Mar Daring, PA-C   Date: 07/08/2021 1:52 PM   Virtual Visit via Video Note   I, Mar Daring, connected with  Jared Bridges  ([id number], 04-Aug-1930) on 07/08/21 at 12:30 PM EDT by a video-enabled telemedicine application and verified that I am speaking with the correct person using two identifiers.  Location: Patient: Virtual Visit Location Patient: Other: rehab Provider: home office in [place]   I discussed the limitations of evaluation and management by  telemedicine and the availability of in person appointments. The patient expressed understanding and agreed to proceed.    History of Present Illness: Jared Bridges is a 87 y.o. who identifies as a male who was assigned male at birth, and is being seen today for questions about medication refills. Previous PCP, Dr. Anastasio Champion, unfortunately passed unexpectedly last week. He has appt with a new PCP, but it is not scheduled until 10/10/21. He did have a f/u appt with Jeralyn Ruths, NP within Dr. Lanice Shirts office on 07/22/21, but that was canceled as they are only going to see acute visits for the time being. He is requesting refills for medications prescribed by Dr. Anastasio Champion to hold him over until she can establish with her new PCP. He is not in urgent need as he is currently in rehab with [facility] for 2 weeks due to a fractured vertebrae in his back   Problems:  Patient Active Problem List   Diagnosis Date Noted   Central cord syndrome at C7 level of cervical spinal cord ([place]) 07/05/2021   Epidural hematoma ([place]) 07/02/2021   Lower urinary tract symptoms (LUTS) 10/31/2020   Prostate cancer ([place]) 06/20/2020   Elevated PSA 04/18/2020   Nocturia 04/18/2020   Cobalamin deficiency 02/22/2020   Intractable chronic migraine without aura 02/20/2020   Altered mental status 02/20/2020   Asthenia 02/20/2020  Diabetic peripheral neuropathy ([place]) 02/20/2020   Obstructive sleep apnea of adult 02/20/2020   Medicare annual wellness visit, subsequent 12/07/2019   Depressed mood 12/07/2019   Vitamin D deficiency 10/25/2019   Memory loss 10/25/2019   Fall 10/25/2019   Nonrheumatic aortic valve stenosis 03/23/2018   Migraine headaches 12/29/2017   TIA (transient ischemic attack) 12/28/2017   Rectal bleeding 11/26/2017   Carpal tunnel syndrome, left 07/22/2017   Acquired trigger finger 07/22/2017   Acute cholecystitis 04/05/2016   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   Total knee  replacement status 02/28/2016   CVA (cerebral infarction) 12/19/2015   Acute CVA (cerebrovascular accident) ([place]) 12/19/2015   Carotid stenosis 12/19/2015   Postoperative wound dehiscence 07/09/2015   Type 2 diabetes mellitus, uncontrolled ([place]) 06/29/2015   Cellulitis of knee, left 06/29/2015   Wound infection after surgery 06/29/2015   OA (osteoarthritis) of knee 05/28/2015   BRBPR (bright red blood per rectum) 11/21/2014   Orthostatic hypotension 11/21/2014   Dizziness 11/16/2014   Polymyalgia rheumatica ([place]) 09/12/2014   Anemia, iron deficiency 08/16/2014   DJD (degenerative joint disease) 08/16/2014   Essential hypertension    Type 2 diabetes mellitus ([place])    Cataract, nuclear 04/11/2014   Complaining of back-related symptom 03/15/2014   Arthralgia of hip or thigh 03/15/2014   Gonalgia 10/10/2013   Left knee pain 10/10/2013   1st degree AV block 08/04/2013   Infection of the upper respiratory tract 04/05/2013   Cataract 02/24/2013   Pseudoaphakia 02/24/2013   Episode of syncope 01/11/2013   Cardiac murmur 11/09/2012   Near syncope 11/09/2012   Anxiety 06/18/2012   Clinical depression 06/18/2012   Bleeding gastrointestinal 06/18/2012   Diverticulitis 06/18/2012   Difficulty hearing 06/18/2012   Adaptive colitis 06/18/2012   Arthritis, degenerative 06/18/2012   Disease of thyroid gland 06/18/2012   Hearing impairment 06/18/2012   Multinodular goiter 01/29/2012   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   Adenoma of large intestine 05/31/2011   Cephalalgia 05/31/2011   Hypercholesterolemia 05/31/2011   Benign prostatic hyperplasia with urinary obstruction 05/31/2011   LBP (low back pain) 05/31/2011   Diabetes mellitus ([place]) 05/31/2011   Essential (primary) hypertension 05/31/2011   Headache(784.0) 05/31/2011   Hyperplasia of prostate 05/31/2011    Allergies:  Allergies  Allergen Reactions   Iodine Hives, Itching and Other (See Comments)    very allergic(per daughter), can  pre-med with benadryl   Calan [Verapamil] Other (See Comments)    Weakness   Cymbalta [Duloxetine Hcl] Other (See Comments)    Makes patient have jerking motions.   Iodinated Diagnostic Agents Hives, Itching and Other (See Comments)    Can pre-med with benadryl   Latex Other (See Comments)    Redness iritation   Lisinopril Other (See Comments)    Makes patient have jerking motions.   Neurontin [Gabapentin] Other (See Comments)    Makes patient have jerking motions.   Zoloft [Sertraline Hcl] Diarrhea and Other (See Comments)    Makes patient have jerking motions.   Valium [Diazepam] Other (See Comments)    "Did not work"   Flomax [Tamsulosin Hcl] Other (See Comments)    Dizziness   Glimepiride Other (See Comments)    "Did not work"   Melatonin Nausea Only   Tape Other (See Comments)    Skin irritation   Medications: No current facility-administered medications for this visit.  Current Outpatient Medications:    atorvastatin (LIPITOR) 40 MG tablet, Take 1 tablet (40 mg total) by mouth daily., Disp: 90  tablet, Rfl: 0   venlafaxine XR (EFFEXOR-XR) 37.5 MG 24 hr capsule, Take 1 capsule (37.5 mg total) by mouth every morning., Disp: 90 capsule, Rfl: 0  Facility-Administered Medications Ordered in Other Visits:    acetaminophen (TYLENOL) tablet 325-650 mg, 325-650 mg, Oral, Q4H PRN, Love, Pamela S, PA-C   acetaminophen (TYLENOL) tablet 650 mg, 650 mg, Oral, TID, Love, Pamela S, PA-C, 650 mg at 07/08/21 0846   alfuzosin (UROXATRAL) 24 hr tablet 10 mg, 10 mg, Oral, QPM, Love, Pamela S, PA-C, 10 mg at 07/07/21 1727   alum & mag hydroxide-simeth (MAALOX/MYLANTA) 200-200-20 MG/5ML suspension 30 mL, 30 mL, Oral, Q4H PRN, Love, Pamela S, PA-C   atorvastatin (LIPITOR) tablet 40 mg, 40 mg, Oral, Daily, Love, Pamela S, PA-C, 40 mg at 07/08/21 0846   bisacodyl (DULCOLAX) suppository 10 mg, 10 mg, Rectal, Daily PRN, Love, Pamela S, PA-C   cholecalciferol (VITAMIN D3) tablet 5,000 Units, 5,000  Units, Oral, QHS, Love, Pamela S, PA-C, 5,000 Units at 07/07/21 2146   diphenhydrAMINE (BENADRYL) 12.5 MG/5ML elixir 12.5-25 mg, 12.5-25 mg, Oral, Q6H PRN, Love, Pamela S, PA-C   enzalutamide (XTANDI) tablet 80 mg, 80 mg, Oral, QPM, Love, Pamela S, PA-C, 80 mg at 07/07/21 1750   finasteride (PROSCAR) tablet 5 mg, 5 mg, Oral, Daily, Love, Pamela S, PA-C, 5 mg at 07/08/21 0847   guaiFENesin-dextromethorphan (ROBITUSSIN DM) 100-10 MG/5ML syrup 5-10 mL, 5-10 mL, Oral, Q6H PRN, Love, Pamela S, PA-C, 10 mL at 07/07/21 2147   insulin aspart (novoLOG) injection 0-9 Units, 0-9 Units, Subcutaneous, TID WC, Love, Pamela S, PA-C, 3 Units at 07/08/21 1234   oxyCODONE (Oxy IR/ROXICODONE) immediate release tablet 5 mg, 5 mg, Oral, Q6H PRN, Love, Pamela S, PA-C, 5 mg at 07/07/21 1227   pantoprazole (PROTONIX) EC tablet 20 mg, 20 mg, Oral, Daily, Love, Pamela S, PA-C, 20 mg at 07/08/21 0846   polyethylene glycol (MIRALAX / GLYCOLAX) packet 17 g, 17 g, Oral, Daily, Alger Simons T, MD, 17 g at 07/08/21 0848   pregabalin (LYRICA) capsule 75 mg, 75 mg, Oral, BID, Love, Pamela S, PA-C, 75 mg at 07/08/21 [id number]   prochlorperazine (COMPAZINE) tablet 5-10 mg, 5-10 mg, Oral, Q6H PRN **OR** prochlorperazine (COMPAZINE) injection 5-10 mg, 5-10 mg, Intramuscular, Q6H PRN **OR** prochlorperazine (COMPAZINE) suppository 12.5 mg, 12.5 mg, Rectal, Q6H PRN, Love, Pamela S, PA-C   senna-docusate (Senokot-S) tablet 1 tablet, 1 tablet, Oral, BID, Love, Pamela S, PA-C, 1 tablet at 07/08/21 0846   sodium phosphate (FLEET) 7-19 GM/118ML enema 1 enema, 1 enema, Rectal, Once PRN, Love, Pamela S, PA-C   topiramate (TOPAMAX) tablet 50 mg, 50 mg, Oral, BID, Love, Pamela S, PA-C, 50 mg at 07/08/21 0847   traZODone (DESYREL) tablet 25-50 mg, 25-50 mg, Oral, QHS PRN, Love, Pamela S, PA-C, 50 mg at 07/07/21 2145   venlafaxine XR (EFFEXOR-XR) 24 hr capsule 37.5 mg, 37.5 mg, Oral, q morning, Love, Pamela S, PA-C, 37.5 mg at 07/08/21 1234   vitamin  B-12 (CYANOCOBALAMIN) tablet 1,000 mcg, 1,000 mcg, Oral, Daily, Love, Pamela S, PA-C, 1,000 mcg at 07/08/21 0848   Assessment and Plan: 1. Recurrent major depressive disorder, in partial remission ([name] - venlafaxine XR (EFFEXOR-XR) 37.5 MG 24 hr capsule; Take 1 capsule (37.5 mg total) by mouth every morning.  Dispense: 90 capsule; Refill: 0  2. Hypercholesterolemia - atorvastatin (LIPITOR) 40 MG tablet; Take 1 tablet (40 mg total) by mouth daily.  Dispense: 90 tablet; Refill: 0  - Was approved by Dr. Sabra Heck to  refill patient's prescriptions for 3 months, instead of customary one month, due to circumstances regarding refills. - Above medications were refilled  Follow Up Instructions: I discussed the assessment and treatment plan with the patient. The patient was provided an opportunity to ask questions and all were answered. The patient agreed with the plan and demonstrated an understanding of the instructions.  A copy of instructions were sent to the patient via MyChart.  The patient was advised to call back or seek an in-person evaluation if the symptoms worsen or if the condition fails to improve as anticipated.  Time:  I spent 11 minutes with the patient via telehealth technology discussing the above problems/concerns.    Mar Daring, PA-C

## 2021-07-08 NOTE — Patient Instructions (Signed)
Jared Bridges, thank you for joining Mar Daring, PA-C for today's virtual visit.  While this provider is not your primary care provider (PCP), if your PCP is located in our provider database this encounter information will be shared with them immediately following your visit.  Consent: (Patient) Jared Bridges provided verbal consent for this virtual visit at the beginning of the encounter.  Current Medications: No current facility-administered medications for this visit.  Current Outpatient Medications:    atorvastatin (LIPITOR) 40 MG tablet, Take 1 tablet (40 mg total) by mouth daily., Disp: 90 tablet, Rfl: 0   venlafaxine XR (EFFEXOR-XR) 37.5 MG 24 hr capsule, Take 1 capsule (37.5 mg total) by mouth every morning., Disp: 90 capsule, Rfl: 0  Facility-Administered Medications Ordered in Other Visits:    acetaminophen (TYLENOL) tablet 325-650 mg, 325-650 mg, Oral, Q4H PRN, Love, Pamela S, PA-C   acetaminophen (TYLENOL) tablet 650 mg, 650 mg, Oral, TID, Love, Pamela S, PA-C, 650 mg at 07/08/21 0846   alfuzosin (UROXATRAL) 24 hr tablet 10 mg, 10 mg, Oral, QPM, Love, Pamela S, PA-C, 10 mg at 07/07/21 1727   alum & mag hydroxide-simeth (MAALOX/MYLANTA) 200-200-20 MG/5ML suspension 30 mL, 30 mL, Oral, Q4H PRN, Love, Pamela S, PA-C   atorvastatin (LIPITOR) tablet 40 mg, 40 mg, Oral, Daily, Love, Pamela S, PA-C, 40 mg at 07/08/21 0846   bisacodyl (DULCOLAX) suppository 10 mg, 10 mg, Rectal, Daily PRN, Love, Pamela S, PA-C   cholecalciferol (VITAMIN D3) tablet 5,000 Units, 5,000 Units, Oral, QHS, Love, Pamela S, PA-C, 5,000 Units at 07/07/21 2146   diphenhydrAMINE (BENADRYL) 12.5 MG/5ML elixir 12.5-25 mg, 12.5-25 mg, Oral, Q6H PRN, Love, Pamela S, PA-C   enzalutamide (XTANDI) tablet 80 mg, 80 mg, Oral, QPM, Love, Pamela S, PA-C, 80 mg at 07/07/21 1750   finasteride (PROSCAR) tablet 5 mg, 5 mg, Oral, Daily, Love, Pamela S, PA-C, 5 mg at 07/08/21 0847   guaiFENesin-dextromethorphan  (ROBITUSSIN DM) 100-10 MG/5ML syrup 5-10 mL, 5-10 mL, Oral, Q6H PRN, Love, Pamela S, PA-C, 10 mL at 07/07/21 2147   insulin aspart (novoLOG) injection 0-9 Units, 0-9 Units, Subcutaneous, TID WC, Love, Pamela S, PA-C, 3 Units at 07/08/21 1234   oxyCODONE (Oxy IR/ROXICODONE) immediate release tablet 5 mg, 5 mg, Oral, Q6H PRN, Love, Pamela S, PA-C, 5 mg at 07/07/21 1227   pantoprazole (PROTONIX) EC tablet 20 mg, 20 mg, Oral, Daily, Love, Pamela S, PA-C, 20 mg at 07/08/21 0846   polyethylene glycol (MIRALAX / GLYCOLAX) packet 17 g, 17 g, Oral, Daily, Alger Simons T, MD, 17 g at 07/08/21 0848   pregabalin (LYRICA) capsule 75 mg, 75 mg, Oral, BID, Love, Pamela S, PA-C, 75 mg at 07/08/21 [id number]   prochlorperazine (COMPAZINE) tablet 5-10 mg, 5-10 mg, Oral, Q6H PRN **OR** prochlorperazine (COMPAZINE) injection 5-10 mg, 5-10 mg, Intramuscular, Q6H PRN **OR** prochlorperazine (COMPAZINE) suppository 12.5 mg, 12.5 mg, Rectal, Q6H PRN, Love, Pamela S, PA-C   senna-docusate (Senokot-S) tablet 1 tablet, 1 tablet, Oral, BID, Love, Pamela S, PA-C, 1 tablet at 07/08/21 0846   sodium phosphate (FLEET) 7-19 GM/118ML enema 1 enema, 1 enema, Rectal, Once PRN, Love, Pamela S, PA-C   topiramate (TOPAMAX) tablet 50 mg, 50 mg, Oral, BID, Love, Pamela S, PA-C, 50 mg at 07/08/21 0847   traZODone (DESYREL) tablet 25-50 mg, 25-50 mg, Oral, QHS PRN, Love, Pamela S, PA-C, 50 mg at 07/07/21 2145   venlafaxine XR (EFFEXOR-XR) 24 hr capsule 37.5 mg, 37.5 mg, Oral, q morning, Love, Pamela S,  PA-C, 37.5 mg at 07/08/21 1234   vitamin B-12 (CYANOCOBALAMIN) tablet 1,000 mcg, 1,000 mcg, Oral, Daily, Love, Pamela S, PA-C, 1,000 mcg at 07/08/21 0848   Medications ordered in this encounter:  Meds ordered this encounter  Medications   atorvastatin (LIPITOR) 40 MG tablet    Sig: Take 1 tablet (40 mg total) by mouth daily.    Dispense:  90 tablet    Refill:  0    Order Specific Question:   Supervising Provider    Answer:   Sabra Heck, BRIAN  [[id number]]   venlafaxine XR (EFFEXOR-XR) 37.5 MG 24 hr capsule    Sig: Take 1 capsule (37.5 mg total) by mouth every morning.    Dispense:  90 capsule    Refill:  0    This prescription was filled on 02/12/2021. Any refills authorized will be placed on file.    Order Specific Question:   Supervising Provider    Answer:   Noemi Chapel [[id number]]     *If you need refills on other medications prior to your next appointment, please contact your pharmacy*  Follow-Up: Call back or seek an in-person evaluation if the symptoms worsen or if the condition fails to improve as anticipated.   If you have been instructed to have an in-person evaluation today at a local Urgent Care facility, please use the link below. It will take you to a list of all of our available [facility], including address, phone number and hours of operation. Please do not delay care.  [facility]  If you or a family member do not have a primary care provider, use the link below to schedule a visit and establish care. When you choose a [facility] primary care physician or advanced practice provider, you gain a long-term partner in health. Find a Primary Care Provider  Learn more about [facility]'s in-office and virtual care options: [facility]

## 2021-07-08 NOTE — Progress Notes (Signed)
Occupational Therapy Session Note  Patient Details  Name: Jared Bridges MRN: [id number] Date of Birth: 1930/06/13  Today's Date: 07/08/2021 OT Individual Time: [id number] and [id number] OT Individual Time Calculation (min): 71 min and 35 min Missed 10 mins d/t fatigue   Short Term Goals: Week 1:  OT Short Term Goal 1 (Week 1): Pt will engage in functional activity for 3 minutes without rest to increase activity tolerance required for ADLs. OT Short Term Goal 2 (Week 1): Pt will complete LB dressing with mod assist, using AE prn. OT Short Term Goal 3 (Week 1): Pt will complete toilet transfer with min assist.  Skilled Therapeutic Interventions/Progress Updates:    Pt greeted at time of session resting bed level with RN providing med pass. Pt states he is "always" hurting and RN aware, pain medication in med pass. Once completed, semireclined > sit EOB with Min/Mod A and sitting EOB noted poor posture and flexed neck. Attempted STM at trap/rhomboids with little benefit, continued to have flexed posture. Stand pivot bed > wheelchair Mod A w/ RW and self propel to sink for oral hygiene with Min A. UB dress w/ pull over shirt Max A and pants same manner, limited d/t BUE [place]. Wheelchair transport > gym and focused on BUE grip strength and [place]. Grip strength measured with the following results: R hand: 33# and L hand 20#.   1x10 squeezes in hand with in hand manipulation and translation with green level sponge, [place] with red and green clips with reaching crossing midline 1x5 each. Back in room, stand pivot to bed Mod A and alarm on call bell in reach.  Session 2: Pt greeted at time of scheduled OT session sitting up in TIS chair no pain, but very fatigued and stating that he cannot do anything else today. Agreeable to therapist providing HEP, provided pt with yellow grade theraputty and FMC/grip strengthening activities/exercises but too fatigued to practice at this time. Pt wanting to lay down, sit > stand  from TIS Mod A and stand pivot back to bed but half way back to bed pt noted to go into extreme flexed posture and knees began to buckle, NT present and assisted for 2+ transfer back to bed as pt trying to sit prematurely. Pt stating he was aware of what was happening but unable to stop. Sit > supine Mod A and alarm on call bell in reach. Missed 10 mins d/t fatigue.   Therapy Documentation Precautions:  Precautions Precautions: Cervical, Fall Required Braces or Orthoses: Cervical Brace Cervical Brace: Hard collar, At all times Restrictions Weight Bearing Restrictions: No    Therapy/Group: Individual Therapy  Viona Gilmore 07/08/2021, 7:17 AM

## 2021-07-09 ENCOUNTER — Ambulatory Visit (INDEPENDENT_AMBULATORY_CARE_PROVIDER_SITE_OTHER): Payer: Medicare Other | Admitting: Internal Medicine

## 2021-07-09 DIAGNOSIS — R739 Hyperglycemia, unspecified: Secondary | ICD-10-CM

## 2021-07-09 DIAGNOSIS — S14127D Central cord syndrome at C7 level of cervical spinal cord, subsequent encounter: Secondary | ICD-10-CM | POA: Diagnosis not present

## 2021-07-09 LAB — GLUCOSE, CAPILLARY
Glucose-Capillary: 161 mg/dL — ABNORMAL HIGH (ref 70–99)
Glucose-Capillary: 194 mg/dL — ABNORMAL HIGH (ref 70–99)
Glucose-Capillary: 219 mg/dL — ABNORMAL HIGH (ref 70–99)
Glucose-Capillary: 150 mg/dL — ABNORMAL HIGH (ref 70–99)

## 2021-07-09 MED ORDER — TRAZODONE HCL 50 MG PO TABS
25.0000 mg | ORAL_TABLET | Freq: Every day | ORAL | Status: DC
  Administered 2021-07-09 – 2021-07-11 (×3): 25 mg via ORAL
  Filled 2021-07-09 (×3): qty 1

## 2021-07-09 MED ORDER — TRAZODONE HCL 50 MG PO TABS: 25.0000 mg | ORAL_TABLET | Freq: Every day | ORAL | Status: DC

## 2021-07-09 NOTE — Progress Notes (Signed)
PROGRESS NOTE   Subjective/Complaints:  Pt reports sleepy this AM- didn't get Trazodone last night and as a result, didn't sleep at all- til 3-4 am- so sleepy this AM.   Will schedule trazodone 25 mg QHS at 8pm.   LBM 2 days ago- large.   ROS:   Pt denies SOB, abd pain, CP, N/V/C/D, and vision changes     Objective:   No results found. Recent Labs    07/08/21 0527  WBC 8.1  HGB 9.3*  HCT 29.1*  PLT 188   Recent Labs    07/08/21 0527  NA 134*  K 3.7  CL 104  CO2 24  GLUCOSE 238*  BUN 28*  CREATININE 1.08  CALCIUM 7.7*    Intake/Output Summary (Last 24 hours) at 07/09/2021 1002 Last data filed at 07/09/2021 0700 Gross per 24 hour  Intake 470 ml  Output --  Net 470 ml        Physical Exam: Vital Signs Blood pressure (!) 150/66, pulse 80, temperature 98.2 F (36.8 C), resp. rate 18, height '6\' 2"'$  (1.88 m), weight 98 kg, SpO2 98 %.    General: awake, alert, appropriate, but obviously sleepy; sitting up in bed; NAD HENT: conjugate gaze; oropharynx moist; has cervical collar in place- no head thrust noted CV: regular rate; no JVD Pulmonary: much less rhonchi- actually just a trace coarse- good air movement GI: soft, NT, ND, (+)BS; normoactive Psychiatric: appropriate; but still sleepy Neurological: alert  Abdomen: Soft, non-tender, non-distended, bowel sounds positive. Extremities: No clubbing, cyanosis, or edema. Pulses are 2+ Psych: Pt's affect is appropriate. Pt is cooperative Skin: old tka incisions Neuro:  Alert and oriented x 3. Normal insight and awareness. Intact Memory. Normal language and speech. Cranial nerve exam unremarkable RUE 5/5 deltoid, biceps, wrist, 3+ to 4- triceps, 3+ to 4- HI. LUE: 5/5 deltoids, biceps, wrist and 3 triceps and 3- HI. LE motor 4- to 4/5 prox to distal. Decreased LT in both upper extremities. DTR's trace Musculoskeletal: improved sitting posture, weaing [place] J  collar.    Assessment/Plan: 1. Functional deficits which require 3+ hours per day of interdisciplinary therapy in a comprehensive inpatient rehab setting. Physiatrist is providing close team supervision and 24 hour management of active medical problems listed below. Physiatrist and rehab team continue to assess barriers to discharge/monitor patient progress toward functional and medical goals  Care Tool:  Bathing    Body parts bathed by patient: Face, Left arm, Right arm, Abdomen, Chest   Body parts bathed by helper: Front perineal area, Buttocks, Right upper leg, Left upper leg, Right lower leg, Left lower leg     Bathing assist Assist Level: Moderate Assistance - Patient 50 - 74%     Upper Body Dressing/Undressing Upper body dressing   What is the patient wearing?: Pull over shirt    Upper body assist Assist Level: Maximal Assistance - Patient 25 - 49%    Lower Body Dressing/Undressing Lower body dressing      What is the patient wearing?: Pants     Lower body assist Assist for lower body dressing: Maximal Assistance - Patient 25 - 49%     Toileting Toileting Toileting  Activity did not occur (Clothing management and hygiene only): N/A (no void or bm)  Toileting assist Assist for toileting: Moderate Assistance - Patient 50 - 74%     Transfers Chair/bed transfer  Transfers assist     Chair/bed transfer assist level: Moderate Assistance - Patient 50 - 74%     Locomotion Ambulation   Ambulation assist   Ambulation activity did not occur: Safety/medical concerns          Walk 10 feet activity   Assist  Walk 10 feet activity did not occur: Safety/medical concerns        Walk 50 feet activity   Assist Walk 50 feet with 2 turns activity did not occur: Safety/medical concerns         Walk 150 feet activity   Assist Walk 150 feet activity did not occur: Safety/medical concerns         Walk 10 feet on uneven surface   activity   Assist Walk 10 feet on uneven surfaces activity did not occur: Safety/medical concerns         Wheelchair     Assist Will patient use wheelchair at discharge?: No Type of Wheelchair: Manual    Wheelchair assist level: Dependent - Patient 0% Max wheelchair distance: 10 ft    Wheelchair 50 feet with 2 turns activity    Assist    Wheelchair 50 feet with 2 turns activity did not occur: Safety/medical concerns   Assist Level: Dependent - Patient 0%   Wheelchair 150 feet activity     Assist  Wheelchair 150 feet activity did not occur: Safety/medical concerns       Blood pressure (!) 150/66, pulse 80, temperature 98.2 F (36.8 C), resp. rate 18, height '6\' 2"'$  (1.88 m), weight 98 kg, SpO2 98 %.  Medical Problem List and Plan: 1.  Functional and mobility deficits secondary to fall with C6/7 fx and EDH and subsequent cervical cord compression at that level. He is a C7 Central cord, left side more affected than right.             -patient may not yet shower             -ELOS/Goals: 10-12 days, supervision to min assist             -Therapy reported that collar is uncomfortably forcing him into flexion when he is out of bed. I have asked [facility] to assess for replacement collar which will keep him in more neutral position.  -cgot new cervical brace- con't PT and OT 2.  Antithrombotics: -DVT/anticoagulation:  Mechanical: Sequential compression devices, below knee Bilateral lower extremities             -antiplatelet therapy: N/A 3. Pain Management: Continue tylenol TID with Oxycodone prn 4. Mood: LCSW to follow for evaluation and support/              -antipsychotic agents: N/A 5. Neuropsych: This patient is capable of making decisions on his own behalf. 6. Skin/Wound Care: routine pressure relief measures.  7. Fluids/Electrolytes/Nutrition: Monitor I/O. Check BMET on 08/08 8. HTN: actually soft--observe for now 9. T2DM: Monitor BS ac/hs. Use SSI for  elevated BS  8/8- BG's 195-235- if continues 1 more day, will change meds  8/9- not on anything except SSI- will check A1c as well as change to carb modified diet- not on any supplements, fyi 10. PMR with peripheral neuropathy: continue Topamax, Lyrica, 11. Prostate cancer w/bony mets:  Treated with XRT 01/2021  at [facility] with proscar 12. Wet voice: intermittent. Sometimes hard to clear with collar and positioning             -cxr reviewed and is clear  -needs to be in good upright posture with meals and when he's just even rusting             -his injury and edema in the setting of his ankylosis and osteophytes certainly will put him at risk for aspiration  -collar replacement as above  8/8- sounds like has rhonchi- will order flutter valve and have him sitting up for all meals- 8/9- lungs MUCH better today- sounds great- con't flutter valve and reminded pt to use.   13. H/o depression: ON Effexor.  14. Constipation: no recorded bm since being in the hospital  -sorbitol and SSE today  8/8- LBM yesterday  8/9- if no BM by tomorrow, will intervene.  15. Urinary incontinence:  -timed voids  -check PVR's    LOS: 4 days A FACE TO FACE EVALUATION WAS PERFORMED  Jared Bridges 07/09/2021, 10:02 AM

## 2021-07-09 NOTE — Progress Notes (Signed)
Occupational Therapy Session Note  Patient Details  Name: Jared Bridges MRN: [id number] Date of Birth: 09/28/1930  Today's Date: 07/09/2021 OT Individual Time: [id number]   &   [id number] OT Individual Time Calculation (min): 40 min    &   27 min Patient missed 20 minutes due to fatigue   Short Term Goals: Week 1:  OT Short Term Goal 1 (Week 1): Pt will engage in functional activity for 3 minutes without rest to increase activity tolerance required for ADLs. OT Short Term Goal 2 (Week 1): Pt will complete LB dressing with mod assist, using AE prn. OT Short Term Goal 3 (Week 1): Pt will complete toilet transfer with min assist.  Skilled Therapeutic Interventions/Progress Updates:    AM session:   Patient seated in TIS w/c, alert and ready for therapy session.  Aspen collar in place.  He notes that pain is currently under control.  Tilted chair forward and placed feet on floor for upright posture, upper body and lower body AROM/therapeutic exercise - he was able to complete a variety of activities to include w/c push up, weight shift etc for 20 minutes with rest break between - needed to move to tilted position due to fatigue and difficulty maintaining head position - completed scapular mobility, shoulder PROM/AAROM, hand mobility activities in tilted position.  Patient able to answer mobile phone and hold to ear to speak with daughter, fatigues quickly and encouraged to use speaker option - able to hear with volume all the way up.  He remained seated in w/c in upright position to eat lunch - nurse tech present to set up meal.     PM session:   patient seated in TIS chair - states that he was able to complete most of meal but had some difficulty with spearing/scooping.  Provided red foam to build up handles of utencils and dycem to manage items on tray - reviewed use with patient (unable to trial with patient this session due to increased pain and fatigue).  Sit pivot transfer w/c to bed with mod A.   Sit to supine mod A for trunk and bilateral leg management.  He notes relief with position change but states that he is too fatigued at this time for even bed level activities.  Missed 20 minutes of therapy - will make up time as appropriate.  He remained in bed, bed alarm set, call bell and tray table in reach.      Therapy Documentation Precautions:  Precautions Precautions: Cervical, Fall Required Braces or Orthoses: Cervical Brace Cervical Brace: Hard collar, At all times Restrictions Weight Bearing Restrictions: Yes   Therapy/Group: Individual Therapy  Carlos Levering 07/09/2021, 7:42 AM

## 2021-07-09 NOTE — Progress Notes (Signed)
Patient ID: Jared Bridges, male   DOB: February 22, 1930, 87 y.o.   MRN: [id number]  SW spoke with pt dtr [name] [phone]) to introduce self, explain role, and discuss discharge process. Dtr confirms she cares for mother and father, and will continue to provide support to them both when he is ready for d/c. She is working on finding a new PCP for patient to sudden passing of current PCP. She is aware SW will f/u after team conference.   SW met with pt in room to provide updates from team conference, and ELOS 3 weeks. Pt is determined to work towards being as independent as possible. Pt aware SW to follow-up with [name].   [name], MSW, [place] Office: [phone] Cell: [phone] Fax: [phone]

## 2021-07-09 NOTE — Progress Notes (Signed)
Physical Therapy Session Note  Patient Details  Name: Jared Bridges MRN: [id number] Date of Birth: 1930-04-09  Today's Date: 07/09/2021 PT Individual Time: 0900-1000 PT Individual Time Calculation (min): 60 min   Short Term Goals: Week 1:  PT Short Term Goal 1 (Week 1): Pt will perform bed mobility with consistent Min A following cervical/ spinal precautions. PT Short Term Goal 2 (Week 1): Pt will perform STS with Min A using RW. PT Short Term Goal 3 (Week 1): Pt will perform SPVT with consistent Min A and reduced pain using RW. PT Short Term Goal 4 (Week 1): Pt will ambulate at least 20 feet with Min A using RW. Week 2:    Week 3:     Skilled Therapeutic Interventions/Progress Updates:   Pain:  Pt reports post cervical pain. Varies from 2-4.  Treatment to tolerance.  Rest breaks and repositioning as needed.  Use of TIS wc for ease of repositioning, postural support.  Pt initially  supine, slowly waking up and agreeable to treatment session w/focus on functional mobility.  Pt incontinent of urine, has not been changed today, soiled linens.  Rolls w/max assist L/R for brief change, pericare, donning pants. Pt able to bridge slightly for raising pants over hips. Supine to sit w/max assist from level bed. Pt sits several min to adjust, nursing provided meds, cga w/static sitting. Sit to stand from bed w/mod assist. stand pivot transfer bed to wc w/max assist, knees buckle mid transfer requiring max assist for safety. Pt transported to gym. In parallel bars:  Sit to stand w/mod assist, stands x 2 min w/cues for posture, tends to "sink" into flexion gradually at hips/trunk, able to correct somewhat w/cues except L knee maintains flexion.  Repeated x 2.  Gait:  78f in parallel bars w/max assist w/L knee hyperextension thru stance.  Difficulty w/bilat LE forward advancement due to ataxia w/attempted advancement w/return to starting position, heavy post tendency, hip and trunk flexion but can  improve w/cues, relies heavily on Ues.  Close wc follow for safety due to tendency for buckling at knees.  Pt transported to room and left oob in wc w/alarm belt set and needs in reach      Therapy Documentation Precautions:  Precautions Precautions: Cervical, Fall Required Braces or Orthoses: Cervical Brace Cervical Brace: Hard collar, At all times Restrictions Weight Bearing Restrictions: Yes     Therapy/Group: Individual Therapy BCallie Fielding PT   BJerrilyn Cairo8/08/2021, 12:42 PM

## 2021-07-10 ENCOUNTER — Encounter (HOSPITAL_COMMUNITY): Payer: Self-pay | Admitting: Physical Medicine and Rehabilitation

## 2021-07-10 DIAGNOSIS — S14127D Central cord syndrome at C7 level of cervical spinal cord, subsequent encounter: Secondary | ICD-10-CM | POA: Diagnosis not present

## 2021-07-10 DIAGNOSIS — R739 Hyperglycemia, unspecified: Secondary | ICD-10-CM | POA: Diagnosis not present

## 2021-07-10 LAB — GLUCOSE, CAPILLARY
Glucose-Capillary: 184 mg/dL — ABNORMAL HIGH (ref 70–99)
Glucose-Capillary: 193 mg/dL — ABNORMAL HIGH (ref 70–99)
Glucose-Capillary: 134 mg/dL — ABNORMAL HIGH (ref 70–99)
Glucose-Capillary: 167 mg/dL — ABNORMAL HIGH (ref 70–99)

## 2021-07-10 LAB — HEMOGLOBIN A1C
Hgb A1c MFr Bld: 6.4 % — ABNORMAL HIGH (ref 4.8–5.6)
Mean Plasma Glucose: 136.98 mg/dL

## 2021-07-10 MED ORDER — SORBITOL 70 % SOLN
30.0000 mL | Freq: Once | Status: AC
  Administered 2021-07-10: 30 mL via ORAL
  Filled 2021-07-10: qty 30

## 2021-07-10 MED ORDER — ALBUTEROL SULFATE (2.5 MG/3ML) 0.083% IN NEBU: 2.5000 mg | INHALATION_SOLUTION | RESPIRATORY_TRACT | Status: DC | PRN

## 2021-07-10 NOTE — Progress Notes (Signed)
Brought pt his breakfast. Instructed pt that he is to get up to eat. Pt states "Im not getting up to the chair". Sat pt up all the way to eat his breakfast.

## 2021-07-10 NOTE — Patient Care Conference (Signed)
Inpatient RehabilitationTeam Conference and Plan of Care Update Date: 07/09/2021   Time: 11:09 AM    Patient Name: Jared Bridges      Medical Record Number: [id number]  Date of Birth: 1930/06/01 Sex: Male         Room/Bed: 4W26C/4W26C-01 Payor Info: Payor: [place] / Plan: [employer] MEDICARE / Product Type: *No Product type* /    Admit Date/Time:  07/05/2021  8:24 PM  Primary Diagnosis:  Central cord syndrome at C7 level of cervical spinal cord [facility])  Hospital Problems: Principal Problem:   Central cord syndrome at C7 level of cervical spinal cord [facility])    Expected Discharge Date: Expected Discharge Date:  (3 weeks)  Team Members Present: Physician leading conference: Dr. Courtney Heys Social Worker Present: [name], [place] Nurse Present: [name], RN PT Present: [name], PT OT Present: [name], OT PPS Coordinator present : [name], SLP     Current Status/Progress Goal Weekly Team Focus  Bowel/Bladder   Incontinent of B&B  Patient will regain continence of both  regular toileting/ bladder and bowel training   Swallow/Nutrition/ Hydration             ADL's   flexed posture, mod A transfers, max A upper body and lower body adl, oral care/eating min A  min A  upright posture, general conditioning, seated balance, adl / assistive device training, transfer training, weight shift/pressure relief   Mobility   max assist w/bed mobility, transfers, initiated gait in parallel bars max assist  min assist  seating and positioning, transfers, core/LE strength, standing, initiation of gait   Communication             Safety/Cognition/ Behavioral Observations            Pain   Patient denies any pain  Patient will remain pain free  Assess pain q shift and prn, treat as ordered.   Skin   patient has pinkish sacrum but intact. fragile ecchymotic U and LE with skin tears from recent fall  patient will not obtain new skin issues and will  have no infection from current wounds  Assess skin q shift and prn. Keep clean and dry esp after elimination.     Discharge Planning:  Pt to d/c to home with support from [name] and wife.   Team Discussion: Prostate cancer with mets. Really wet voice, ordered Flutter Valve. Placed on Carb Mod diet, ordered A1c. Incontinent bladder, continent bowel. Has scheduled Tylenol for pain, needs to sit up for meals, aspirates on liquids, CXR WNL, needs C-collar. Tolerating sitting in tilt-n-space, has pain in neck, sleeping issues. Bilateral knees buckle, needs a Younker for secretions. Max assist for upper and lower body. Transfers can be mod assist but a safer transfer method is needed.  Patient on target to meet rehab goals: Has min assist goals.  *See Care Plan and progress notes for long and short-term goals.   Revisions to Treatment Plan:  Adding Trazodone for sleep.  Teaching Needs: Family education, medication management, pain management, bladder management, skin/wound care, diabetes education, weight bearing precautions, transfer training, gait training, balance training, endurance training, stair training, safety awareness.  Current Barriers to Discharge: Inaccessible home environment, Decreased caregiver support, Medical stability, New diabetic, Neurogenic bowel and bladder, Wound care, Lack of/limited family support, Weight, Weight bearing restrictions, Medication compliance, and Behavior  Possible Resolutions to Barriers: Continue current medications, provide emotional support.     Medical Summary Current Status: incontinent  of urine- continent of bowel- pain "vague"-using tylenol; pain in neck usually;  not sleeping well- added Trazodone scheduled;sit up for all intake;  Barriers to Discharge: Other (comments);Decreased family/caregiver support;Home enviroment access/layout;Incontinence;Medical stability;Neurogenic Bowel & Bladder;New diabetic;Weight bearing restrictions;Wound  care  Barriers to Discharge Comments: d/c with daughter [name]- who takes care of mother with mild dementia as well; had almost fall yesterday _DIDN'T fall- knee buckled; Possible Resolutions to [employer] Focus: insistent going to 2 story home; chair lift?; started parallel bars- max A 1-2; get yonker for pt;  focus- pain; neurogeni cbowel and bladder; and sleep; d/c- at least 3 weeks   Continued Need for Acute Rehabilitation Level of Care: The patient requires daily medical management by a physician with specialized training in physical medicine and rehabilitation for the following reasons: Direction of a multidisciplinary physical rehabilitation program to maximize functional independence : Yes Medical management of patient stability for increased activity during participation in an intensive rehabilitation regime.: Yes Analysis of laboratory values and/or radiology reports with any subsequent need for medication adjustment and/or medical intervention. : Yes   I attest that I was present, lead the team conference, and concur with the assessment and plan of the team.   [name] 07/10/2021, 2:58 PM

## 2021-07-10 NOTE — Progress Notes (Signed)
Patient suction set up bedside. Patient educated to cough and deep breath with teach back and also how to use suction with teach back. Sanda Linger, LPN

## 2021-07-10 NOTE — Progress Notes (Addendum)
Occupational Therapy Session Note  Patient Details  Name: Jared Bridges MRN: [id number] Date of Birth: [birth year]  Today's Date: 07/10/2021 OT Individual Time: [id number] OT Individual Time Calculation (min): 70 min    Short Term Goals: Week 1:  OT Short Term Goal 1 (Week 1): Pt will engage in functional activity for 3 minutes without rest to increase activity tolerance required for ADLs. OT Short Term Goal 2 (Week 1): Pt will complete LB dressing with mod assist, using AE prn. OT Short Term Goal 3 (Week 1): Pt will complete toilet transfer with min assist.  Skilled Therapeutic Interventions/Progress Updates:  Pt greeted supine in bed agreeable to OT intervention, but reports wanting to call his family. Pt with difficulty holding onto cell phone, applied theraband to back side of pts phone ( in between phone and phone case) to create a loop for pt to hold phone. Pt needed assist to stabilize LUE at elbow while pt using R hand to manipulate phone, pt may benefit from AE for cell phone as needed d/t impaired BUE strength and coordination. Pt required MAX to don pants from bed level with pt needing assist to thread pants but able to roll R<>L with CGA to pull pants up to waist line.  Pt required MOD A for bed mobility using log roll technique to exit to L side of bed. Pt completed stand pivot transfer from EOB>TIS with MOD A. Pt transported to sink to complete grooming tasks with supervision, with pt completing oral and hair brushing. Pt worked on light BUE therex with compliant cube to increase [place] and strength in Bilateral hands. X20 reps each hand. Pt left up in TIS with safety belt activated and all needs within reach.   Therapy Documentation Precautions:  Precautions Precautions: Cervical, Fall Required Braces or Orthoses: Cervical Brace Cervical Brace: Hard collar, At all times Restrictions Weight Bearing Restrictions: Yes  Pain: Pt reports pain is 3-4 just laying in bed.     Therapy/Group: Individual Therapy  Precious Haws 07/10/2021, 12:10 PM

## 2021-07-10 NOTE — Progress Notes (Signed)
PROGRESS NOTE   Subjective/Complaints:  Pt reports laid back down after breakfast- is having more "wheezing" this AM, and sat up in bed for breakfast "as far as he could".  Said using flutter valve "some".  Doesn't have bedside suction- will arrange- since swallowing secretions- large amount.   ROS:   Pt denies SOB, abd pain, CP, N/V/C/D, and vision changes       Objective:   No results found. Recent Labs    07/08/21 0527  WBC 8.1  HGB 9.3*  HCT 29.1*  PLT 188   Recent Labs    07/08/21 0527  NA 134*  K 3.7  CL 104  CO2 24  GLUCOSE 238*  BUN 28*  CREATININE 1.08  CALCIUM 7.7*    Intake/Output Summary (Last 24 hours) at 07/10/2021 0830 Last data filed at 07/09/2021 2059 Gross per 24 hour  Intake 420 ml  Output --  Net 420 ml        Physical Exam: Vital Signs Blood pressure (!) 125/56, pulse 64, temperature 98.2 F (36.8 C), resp. rate 18, height '6\' 2"'$  (1.88 m), weight 98 kg, SpO2 97 %.    General: awake, alert, appropriate, laying supine in bed; sounds audibly wheezy; NAD HENT: conjugate gaze; oropharynx moist; wearing cervical collar CV: regular rate; no JVD Pulmonary: wheezy and rhonchi throughout diffuse -worse than Tuesday but better than Monday GI: soft, NT, ND, (+)BS; protuberant vs distended Psychiatric: appropriate; smiling; more awake this AM Neurological: alert  Extremities:  no LE edema seen;  Pulses are 2+- wearing SCDs- that are on.  Psych: Pt's affect is appropriate. Pt is cooperative Skin: old tka incisions Neuro:  Alert and oriented x 3. Normal insight and awareness. Intact Memory. Normal language and speech. Cranial nerve exam unremarkable RUE 5/5 deltoid, biceps, wrist, 3+ to 4- triceps, 3+ to 4- HI. LUE: 5/5 deltoids, biceps, wrist and 3 triceps and 3- HI. LE motor 4- to 4/5 prox to distal. Decreased LT in both upper extremities. DTR's trace Musculoskeletal: improved sitting  posture, weaing [place] J collar.    Assessment/Plan: 1. Functional deficits which require 3+ hours per day of interdisciplinary therapy in a comprehensive inpatient rehab setting. Physiatrist is providing close team supervision and 24 hour management of active medical problems listed below. Physiatrist and rehab team continue to assess barriers to discharge/monitor patient progress toward functional and medical goals  Care Tool:  Bathing    Body parts bathed by patient: Face, Left arm, Right arm, Abdomen, Chest   Body parts bathed by helper: Front perineal area, Buttocks, Right upper leg, Left upper leg, Right lower leg, Left lower leg     Bathing assist Assist Level: Moderate Assistance - Patient 50 - 74%     Upper Body Dressing/Undressing Upper body dressing   What is the patient wearing?: Pull over shirt    Upper body assist Assist Level: Maximal Assistance - Patient 25 - 49%    Lower Body Dressing/Undressing Lower body dressing      What is the patient wearing?: Pants     Lower body assist Assist for lower body dressing: Maximal Assistance - Patient 25 - 49%     Toileting  Toileting Toileting Activity did not occur (Probation officer and hygiene only): N/A (no void or bm)  Toileting assist Assist for toileting: Moderate Assistance - Patient 50 - 74%     Transfers Chair/bed transfer  Transfers assist     Chair/bed transfer assist level: Maximal Assistance - Patient 25 - 49%     Locomotion Ambulation   Ambulation assist   Ambulation activity did not occur: Safety/medical concerns  Assist level: Maximal Assistance - Patient 25 - 49% Assistive device: Parallel bars Max distance: 47f   Walk 10 feet activity   Assist  Walk 10 feet activity did not occur: Safety/medical concerns        Walk 50 feet activity   Assist Walk 50 feet with 2 turns activity did not occur: Safety/medical concerns         Walk 150 feet activity   Assist Walk 150  feet activity did not occur: Safety/medical concerns         Walk 10 feet on uneven surface  activity   Assist Walk 10 feet on uneven surfaces activity did not occur: Safety/medical concerns         Wheelchair     Assist Will patient use wheelchair at discharge?: No Type of Wheelchair: Manual    Wheelchair assist level: Dependent - Patient 0% Max wheelchair distance: 10 ft    Wheelchair 50 feet with 2 turns activity    Assist    Wheelchair 50 feet with 2 turns activity did not occur: Safety/medical concerns   Assist Level: Dependent - Patient 0%   Wheelchair 150 feet activity     Assist  Wheelchair 150 feet activity did not occur: Safety/medical concerns       Blood pressure (!) 125/56, pulse 64, temperature 98.2 F (36.8 C), resp. rate 18, height '6\' 2"'$  (1.88 m), weight 98 kg, SpO2 97 %.  Medical Problem List and Plan: 1.  Functional and mobility deficits secondary to fall with C6/7 fx and EDH and subsequent cervical cord compression at that level. He is a C7 Central cord, left side more affected than right.             -patient may not yet shower             -ELOS/Goals: 10-12 days, supervision to min assist             -Therapy reported that collar is uncomfortably forcing him into flexion when he is out of bed. I have asked [employer] to assess for replacement collar which will keep him in more neutral position.  Con't PT and OT- CIR 2.  Antithrombotics: -DVT/anticoagulation:  Mechanical: Sequential compression devices, below knee Bilateral lower extremities             -antiplatelet therapy: N/A 3. Pain Management: Continue tylenol TID with Oxycodone prn 4. Mood: LCSW to follow for evaluation and support/              -antipsychotic agents: N/A 5. Neuropsych: This patient is capable of making decisions on his own behalf. 6. Skin/Wound Care: routine pressure relief measures.  7. Fluids/Electrolytes/Nutrition: Monitor I/O. Check BMET on 08/08 8. HTN:  actually soft--observe for now 9. T2DM: Monitor BS ac/hs. Use SSI for elevated BS  8/8- BG's 195-235- if continues 1 more day, will change meds  8/9- not on anything except SSI- will check A1c as well as change to carb modified diet- not on any supplements, fyi  8/10- A1c is 6.4- BG's looking better with  carb controlled diet- will wait before starting meds.  10. PMR with peripheral neuropathy: continue Topamax, Lyrica, 11. Prostate cancer w/bony mets:  Treated with XRT 01/2021 at [facility] --On Xandi with proscar 12. Wet voice: intermittent. Sometimes hard to clear with collar and positioning             -cxr reviewed and is clear  -needs to be in good upright posture with meals and when he's just even rusting             -his injury and edema in the setting of his ankylosis and osteophytes certainly will put him at risk for aspiration  -collar replacement as above  8/8- sounds like has rhonchi- will order flutter valve and have him sitting up for all meals- 8/9- lungs MUCH better today- sounds great- con't flutter valve and reminded pt to use.    8/10- ordered bedside suction and albuterol nebs- asked for them to be given this AM- also helped pt use Flutter valve x5x- sounded better afterwards, but still needs neb.  13. H/o depression: ON Effexor.  14. Constipation: no recorded bm since being in the hospital  -sorbitol and SSE today  8/10- LBM 8/7- will give Sorbitol after therapy to help him have BM.  15. Urinary incontinence:  -timed voids  -check PVR's  8/10- so far, Bladder scans are <300cc- in last 24 hours- didn't see any measured prior- so will continue.     LOS: 5 days A FACE TO FACE EVALUATION WAS PERFORMED  Jared Bridges 07/10/2021, 8:30 AM

## 2021-07-10 NOTE — Plan of Care (Signed)
  Problem: Consults Goal: RH GENERAL PATIENT EDUCATION Description: See Patient Education module for education specifics. Outcome: Progressing Goal: Skin Care Protocol Initiated - if Braden Score 18 or less Description: If consults are not indicated, leave blank or document N/A Outcome: Progressing Goal: Nutrition Consult-if indicated Outcome: Progressing Goal: Diabetes Guidelines if Diabetic/Glucose > 140 Description: If diabetic or lab glucose is > 140 mg/dl - Initiate Diabetes/Hyperglycemia Guidelines & Document Interventions  Outcome: Progressing   Problem: RH BOWEL ELIMINATION Goal: RH STG MANAGE BOWEL WITH ASSISTANCE Description: STG Manage Bowel with Assistance. Outcome: Progressing Goal: RH STG MANAGE BOWEL W/MEDICATION W/ASSISTANCE Description: STG Manage Bowel with Medication with Assistance. Outcome: Progressing   Problem: RH BLADDER ELIMINATION Goal: RH STG MANAGE BLADDER WITH ASSISTANCE Description: STG Manage Bladder With Assistance Outcome: Progressing Goal: RH STG MANAGE BLADDER WITH MEDICATION WITH ASSISTANCE Description: STG Manage Bladder With Medication With Assistance. Outcome: Progressing Goal: RH STG MANAGE BLADDER WITH EQUIPMENT WITH ASSISTANCE Description: STG Manage Bladder With Equipment With Assistance Outcome: Progressing   Problem: RH SKIN INTEGRITY Goal: RH STG SKIN FREE OF INFECTION/BREAKDOWN Outcome: Progressing Goal: RH STG MAINTAIN SKIN INTEGRITY WITH ASSISTANCE Description: STG Maintain Skin Integrity With Assistance. Outcome: Progressing Goal: RH STG ABLE TO PERFORM INCISION/WOUND CARE W/ASSISTANCE Description: STG Able To Perform Incision/Wound Care With Assistance. Outcome: Progressing   Problem: RH SAFETY Goal: RH STG ADHERE TO SAFETY PRECAUTIONS W/ASSISTANCE/DEVICE Description: STG Adhere to Safety Precautions With Assistance/Device. Outcome: Progressing Goal: RH STG DECREASED RISK OF FALL WITH ASSISTANCE Description: STG  Decreased Risk of Fall With Assistance. Outcome: Progressing   Problem: RH PAIN MANAGEMENT Goal: RH STG PAIN MANAGED AT OR BELOW PT'S PAIN GOAL Outcome: Progressing   Problem: RH KNOWLEDGE DEFICIT GENERAL Goal: RH STG INCREASE KNOWLEDGE OF SELF CARE AFTER HOSPITALIZATION Outcome: Progressing   

## 2021-07-10 NOTE — Progress Notes (Signed)
Physical Therapy Session Note  Patient Details  Name: Jared Bridges MRN: [id number] Date of Birth: [birth year]  Today's Date: 07/10/2021 PT Individual Time: 1100-1205 PT Individual Time Calculation (min): 65 min   Short Term Goals: Week 1:  PT Short Term Goal 1 (Week 1): Pt will perform bed mobility with consistent Min A following cervical/ spinal precautions. PT Short Term Goal 2 (Week 1): Pt will perform STS with Min A using RW. PT Short Term Goal 3 (Week 1): Pt will perform SPVT with consistent Min A and reduced pain using RW. PT Short Term Goal 4 (Week 1): Pt will ambulate at least 20 feet with Min A using RW. Week 2:     Skilled Therapeutic Interventions/Progress Updates:   Pain:  Pt reports post neck  pain which varies from 2-4.  Treatment to tolerance.  Rest breaks and repositioning as needed w/frequent alterations required in support to head/neck in wc.  Pt initially oob in wc and agreeable to treatment session w/focus on gait training in [place]. Therapist educated pt on set up, safety provided in use of, and purpose of LiteGait use.  Pt agreeable to trying. Therapist dons LiteGait harness w/pt seated and standing in litegait w/guarding of r knee, occasional blocking of L knee.  Requres seated rest after 2-3 min standing for set up. Sit to stand using litegait handles w/assist to intially place hands, min assist of two and guarding of knees w/Sit to stand  Gait: 76f x 2 trials w/minimal bodywt support, 2nd person assist to manage device and therapist facilitating wt shifting, cues to widen base, severe forward head and thoracic kyposis/can somewhat correct thoracic posture w/cues.  L knee tends to hyperextend thru stance and w/fatigue pt demonstates bilat knee wobbles w/uncontrolled buckling last 2 feet.   Pt pleased w/functional gains and expressed being impressed w/LiteGait for training.  Discussed using Stedy for wc to/from Bed and demonstrated device/use to patient.  Contacted  NT and requested pt be assisted to bed following meal for break prior to pm therapy sessions.  NT agreed and voiced understanding of use of Stedy for this.  Pt left oob in wc for meal w/alarm belt set and needs in reach   Therapy Documentation Precautions:  Precautions Precautions: Cervical, Fall Required Braces or Orthoses: Cervical Brace Cervical Brace: Hard collar, At all times Restrictions Weight Bearing Restrictions: Yes     Therapy/Group: Individual Therapy BCallie Fielding PRicketts8/08/2021, 12:41 PM

## 2021-07-10 NOTE — Progress Notes (Signed)
Occupational Therapy Session Note  Patient Details  Name: Jared Bridges MRN: [id number] Date of Birth: [birth year]  Today's Date: 07/10/2021 OT Individual Time: 1330-1425 OT Individual Time Calculation (min): 55 min    Short Term Goals: Week 1:  OT Short Term Goal 1 (Week 1): Pt will engage in functional activity for 3 minutes without rest to increase activity tolerance required for ADLs. OT Short Term Goal 2 (Week 1): Pt will complete LB dressing with mod assist, using AE prn. OT Short Term Goal 3 (Week 1): Pt will complete toilet transfer with min assist.  Skilled Therapeutic Interventions/Progress Updates:    Pt resting in w/c upon arrival. Remains on lunch on table. Discussed use of utensils with red foam. Discussed self care needs and home set up. Reviewed medical history with pt. Discussed taking a shower on Friday (8/12). New cervical collar pads obtained. Equipment placed in room. Discussed Pt's goals. Discussed use of BSC and use of Stedy for transfers. Pt issued foam for hand strengthening. Pt remained in TIS w/c with all needs within reach. Belt alarm activated. Pt able to activate call bell.  Therapy Documentation Precautions:  Precautions Precautions: Cervical, Fall Required Braces or Orthoses: Cervical Brace Cervical Brace: Hard collar, At all times Restrictions Weight Bearing Restrictions: No Pain:  Pt denies pain this afternoon   Therapy/Group: Individual Therapy  [name] 07/10/2021, 2:52 PM

## 2021-07-10 NOTE — Progress Notes (Signed)
Patient ID: Jared Bridges, male   DOB: 1930/11/23, 87 y.o.   MRN: [id number]  SW followed up with pt dtr [name] to provide updates from team conference, and ELOS 3 weeks. SW discussed recommendation for possibly stair lift. Pt states that she is meeting with a contractor today for modifications to the home, and will discuss with them. SW will continue to provide updates from team.   [name], MSW, [name] Office: [phone] Cell: [phone] Fax: [phone]

## 2021-07-10 NOTE — Progress Notes (Signed)
Occupational Therapy Session Note  Patient Details  Name: RAINIER BINGER MRN: [id number] Date of Birth: 12/18/1929  Today's Date: 07/10/2021 OT Individual Time: 1500-1530 OT Individual Time Calculation (min): 30 min    Short Term Goals: Week 1:  OT Short Term Goal 1 (Week 1): Pt will engage in functional activity for 3 minutes without rest to increase activity tolerance required for ADLs. OT Short Term Goal 2 (Week 1): Pt will complete LB dressing with mod assist, using AE prn. OT Short Term Goal 3 (Week 1): Pt will complete toilet transfer with min assist.  Skilled Therapeutic Interventions/Progress Updates:  Pt greeted seated in TIS agreeable to OT intervention reporting that he had a good day! Pt ready to return to bed. MAX A for stand pivot transfer back to bed going to pts R side via face to face transfer. Pt required MOD A to return to supine via log roll technique, max cues needed to carryover technique this session. Pt left supine in bed with all needs within reach and bed alarm activated.   Therapy Documentation Precautions:  Precautions Precautions: Cervical, Fall Required Braces or Orthoses: Cervical Brace Cervical Brace: Hard collar, At all times Restrictions Weight Bearing Restrictions: No  Pain: Pt reports mild pain in neck during session, called nurse for pain meds and provided rest breaks as needed as pain mgmt strategy.    Therapy/Group: Individual Therapy  Precious Haws 07/10/2021, 3:50 PM

## 2021-07-11 ENCOUNTER — Telehealth (INDEPENDENT_AMBULATORY_CARE_PROVIDER_SITE_OTHER): Payer: Self-pay

## 2021-07-11 DIAGNOSIS — R739 Hyperglycemia, unspecified: Secondary | ICD-10-CM | POA: Diagnosis not present

## 2021-07-11 DIAGNOSIS — S14127D Central cord syndrome at C7 level of cervical spinal cord, subsequent encounter: Secondary | ICD-10-CM | POA: Diagnosis not present

## 2021-07-11 LAB — GLUCOSE, CAPILLARY
Glucose-Capillary: 146 mg/dL — ABNORMAL HIGH (ref 70–99)
Glucose-Capillary: 170 mg/dL — ABNORMAL HIGH (ref 70–99)
Glucose-Capillary: 180 mg/dL — ABNORMAL HIGH (ref 70–99)
Glucose-Capillary: 105 mg/dL — ABNORMAL HIGH (ref 70–99)

## 2021-07-11 MED ORDER — SENNOSIDES-DOCUSATE SODIUM 8.6-50 MG PO TABS: 1.0000 | ORAL_TABLET | Freq: Every day | ORAL | Status: DC

## 2021-07-11 MED ORDER — POLYETHYLENE GLYCOL 3350 17 G PO PACK: 17.0000 g | PACK | Freq: Every day | ORAL | Status: DC | PRN

## 2021-07-11 MED ORDER — SENNOSIDES-DOCUSATE SODIUM 8.6-50 MG PO TABS
1.0000 | ORAL_TABLET | Freq: Two times a day (BID) | ORAL | Status: DC
  Administered 2021-07-12 – 2021-07-26 (×29): 1 via ORAL
  Filled 2021-07-11 (×29): qty 1

## 2021-07-11 MED ORDER — SENNOSIDES-DOCUSATE SODIUM 8.6-50 MG PO TABS: 1.0000 | ORAL_TABLET | Freq: Two times a day (BID) | ORAL | Status: DC

## 2021-07-11 NOTE — Progress Notes (Signed)
Occupational Therapy Session Note  Patient Details  Name: GARTH WALCZAK MRN: [id number] Date of Birth: [birth year]  Today's Date: 07/11/2021 OT Individual Time: 1100-1155 OT Individual Time Calculation (min): 55 min    Short Term Goals: Week 1:  OT Short Term Goal 1 (Week 1): Pt will engage in functional activity for 3 minutes without rest to increase activity tolerance required for ADLs. OT Short Term Goal 2 (Week 1): Pt will complete LB dressing with mod assist, using AE prn. OT Short Term Goal 3 (Week 1): Pt will complete toilet transfer with min assist.  Skilled Therapeutic Interventions/Progress Updates:    Pt resting in bed upon arrival. Supine>sit EOB with min A using bed rails. Sitting balance EOB with CGA. Pt able to scoot forward in bed in preparation for stand pivot transfer to w/c. Stand pivot transfer to w/c with mod A. Pt washed face and combed hair prior to transitioning to Day Room. Pt initially engaged in table tasks with focus on FMC/manipulation with peg board. Pt with not difficulty manipulating smaller pegs in peg board. Pt presented with 1# weight bar for stratight arm rasises to increase B shoulder strength/endurance-1x8, 1x10, 1x12. Pt returned to room and remained in TIS w/c with all needs within reach. Belt alarm activated.  Therapy Documentation Precautions:  Precautions Precautions: Cervical, Fall Required Braces or Orthoses: Cervical Brace Cervical Brace: Hard collar, At all times Restrictions Weight Bearing Restrictions: No  Pain: Pain Assessment Pain Scale: 0-10 Pain Score: 5  Pain Location: Neck Pain Intervention(s): Medication (See eMAR)   Therapy/Group: Individual Therapy  [name] 07/11/2021, 12:10 PM

## 2021-07-11 NOTE — Progress Notes (Signed)
PROGRESS NOTE   Subjective/Complaints:  Pt reports feels better this AM- resp wise, however still having difficulties feeding self/picking up food- better with R than L.   Are well Runny BM's.  Got bedside suction- not sure helping- has coughed up "globs'.    ROS:   Pt denies SOB, abd pain, CP, N/V/C/D, and vision changes        Objective:   No results found. No results for input(s): WBC, HGB, HCT, PLT in the last 72 hours.  No results for input(s): NA, K, CL, CO2, GLUCOSE, BUN, CREATININE, CALCIUM in the last 72 hours.   Intake/Output Summary (Last 24 hours) at 07/11/2021 0842 Last data filed at 07/11/2021 0700 Gross per 24 hour  Intake 580 ml  Output --  Net 580 ml        Physical Exam: Vital Signs Blood pressure (!) 123/52, pulse 77, temperature 98.5 F (36.9 C), temperature source Oral, resp. rate 20, height '6\' 2"'$  (1.88 m), weight 92.6 kg, SpO2 96 %.    General: awake, alert, appropriate, laying supine in bed; audibly, sounds better, brighter/more awake; NAD HENT: conjugate gaze; oropharynx moist' wearing cervical collar- is correct tightness CV: regular rate; no JVD Pulmonary: much more clear- very trace wheezing heard, but no rhonchi; good air movement GI: soft, NT, ND, (+)BS Psychiatric: appropriate; brighter, more awake Neurological: alert  Extremities:  no LE edema seen;  Pulses are 2+- wearing SCDs- that are on.  Psych: Pt's affect is appropriate. Pt is cooperative Skin: old tka incisions Neuro:  Alert and oriented x 3. Normal insight and awareness. Intact Memory. Normal language and speech. Cranial nerve exam unremarkable RUE 5/5 deltoid, biceps, wrist, 3+ to 4- triceps, 3+ to 4- HI. LUE: 5/5 deltoids, biceps, wrist and 3 triceps and 3- HI. LE motor 4- to 4/5 prox to distal. Decreased LT in both upper extremities. DTR's trace Musculoskeletal: improved sitting posture, weaing [place] J collar.     Assessment/Plan: 1. Functional deficits which require 3+ hours per day of interdisciplinary therapy in a comprehensive inpatient rehab setting. Physiatrist is providing close team supervision and 24 hour management of active medical problems listed below. Physiatrist and rehab team continue to assess barriers to discharge/monitor patient progress toward functional and medical goals  Care Tool:  Bathing    Body parts bathed by patient: Face   Body parts bathed by helper: Front perineal area, Buttocks, Right upper leg, Left upper leg, Right lower leg, Left lower leg     Bathing assist Assist Level: Set up assist (face washing only)     Upper Body Dressing/Undressing Upper body dressing   What is the patient wearing?: Pull over shirt    Upper body assist Assist Level: Maximal Assistance - Patient 25 - 49%    Lower Body Dressing/Undressing Lower body dressing      What is the patient wearing?: Pants     Lower body assist Assist for lower body dressing: Maximal Assistance - Patient 25 - 49% (bed level)     Toileting Toileting Toileting Activity did not occur (Clothing management and hygiene only): N/A (no void or bm)  Toileting assist Assist for toileting: Moderate Assistance - Patient  50 - 74%     Transfers Chair/bed transfer  Transfers assist     Chair/bed transfer assist level: Maximal Assistance - Patient 25 - 49%     Locomotion Ambulation   Ambulation assist   Ambulation activity did not occur: Safety/medical concerns  Assist level: Dependent - Patient 0% Assistive device: Lite Gait Max distance: 80   Walk 10 feet activity   Assist  Walk 10 feet activity did not occur: Safety/medical concerns  Assist level: Dependent - Patient 0% Assistive device: Lite Gait   Walk 50 feet activity   Assist Walk 50 feet with 2 turns activity did not occur: Safety/medical concerns  Assist level: Dependent - Patient 0% Assistive device: Lite Gait     Walk 150 feet activity   Assist Walk 150 feet activity did not occur: Safety/medical concerns         Walk 10 feet on uneven surface  activity   Assist Walk 10 feet on uneven surfaces activity did not occur: Safety/medical concerns         Wheelchair     Assist Will patient use wheelchair at discharge?: No Type of Wheelchair: Manual    Wheelchair assist level: Dependent - Patient 0% Max wheelchair distance: 10 ft    Wheelchair 50 feet with 2 turns activity    Assist    Wheelchair 50 feet with 2 turns activity did not occur: Safety/medical concerns   Assist Level: Dependent - Patient 0%   Wheelchair 150 feet activity     Assist  Wheelchair 150 feet activity did not occur: Safety/medical concerns       Blood pressure (!) 123/52, pulse 77, temperature 98.5 F (36.9 C), temperature source Oral, resp. rate 20, height '6\' 2"'$  (1.88 m), weight 92.6 kg, SpO2 96 %.  Medical Problem List and Plan: 1.  Functional and mobility deficits secondary to fall with C6/7 fx and EDH and subsequent cervical cord compression at that level. He is a C7 Central cord, left side more affected than right.             -patient may not yet shower             -ELOS/Goals: 10-12 days, supervision to min assist             -Therapy reported that collar is uncomfortably forcing him into flexion when he is out of bed. I have asked [facility] to assess for replacement collar which will keep him in more neutral position.  Con't PT and OT- CIR- con't cervical collar- can have shower- if covers incision with tegaderm/telfa. Cannot have Estim due to prostate CA.  2.  Antithrombotics: -DVT/anticoagulation:  Mechanical: Sequential compression devices, below knee Bilateral lower extremities             -antiplatelet therapy: N/A 3. Pain Management: Continue tylenol TID with Oxycodone prn  8/11- pt has taken Oxy 3x in last 3 days- con't prn 4. Mood: LCSW to follow for evaluation and support/               -antipsychotic agents: N/A 5. Neuropsych: This patient is capable of making decisions on his own behalf. 6. Skin/Wound Care: routine pressure relief measures.  7. Fluids/Electrolytes/Nutrition: Monitor I/O. Check BMET on 08/08 8. HTN: actually soft--observe for now 9. T2DM: Monitor BS ac/hs. Use SSI for elevated BS  8/8- BG's 195-235- if continues 1 more day, will change meds  8/9- not on anything except SSI- will check A1c as well as change  to carb modified diet- not on any supplements, fyi  8/10- A1c is 6.4-   8/11- BG's 134-190s- much better with adding Carb modified diet- con't regimen 10. PMR with peripheral neuropathy: continue Topamax, Lyrica, 11. Prostate cancer w/bony mets:  Treated with XRT 01/2021 at [facility] --On Xandi with proscar 12. Wet voice: intermittent. Sometimes hard to clear with collar and positioning             -cxr reviewed and is clear  -needs to be in good upright posture with meals and when he's just even rusting             -his injury and edema in the setting of his ankylosis and osteophytes certainly will put him at risk for aspiration  -collar replacement as above  8/8- sounds like has rhonchi- will order flutter valve and have him sitting up for all meals- 8/9- lungs MUCH better today- sounds great- con't flutter valve and reminded pt to use.    8/10- ordered bedside suction and albuterol nebs- asked for them to be given this AM- also helped pt use Flutter valve x5x- sounded better afterwards, but still needs neb. 8/11- sounds almost clear this AM- con't regimen and Albuterol prn and flutter valve.   13. H/o depression: ON Effexor.  14. Constipation: no recorded bm since being in the hospital  -sorbitol and SSE today  8/10- LBM 8/7- will give Sorbitol after therapy to help him have BM.   8/11- loose/runny stools, due to Sorbitol- will make miralax prn and con't Senokot, but change to restart tomorrow.  15. Urinary incontinence:  -timed  voids  -check PVR's  8/10- so far, Bladder scans are <300cc- in last 24 hours- didn't see any measured prior- so will continue.     LOS: 6 days A FACE TO FACE EVALUATION WAS PERFORMED  Rhyli Depaula 07/11/2021, 8:42 AM

## 2021-07-11 NOTE — Telephone Encounter (Signed)
Return call dtr, / [name]. She said he fell on a bar of soap in restroom. Then he is in hosptial to rehab for now. But she said he is worried a about [facility] office. Dure to the loss of Dr Anastasio Champion and so quickly. So when she got to discussed with a neighbor and we was just saying how sadden he is gone. Pt wanted see if they can do something for the staff.  All is okay , just called out of concern.

## 2021-07-11 NOTE — Progress Notes (Signed)
Physical Therapy Session Note  Patient Details  Name: Jared Bridges MRN: [id number] Date of Birth: 1930-02-12  Today's Date: 07/11/2021 PT Individual Time: 8270-7867 PT Individual Time Calculation (min): 24 min   Short Term Goals: Week 1:  PT Short Term Goal 1 (Week 1): Pt will perform bed mobility with consistent Min A following cervical/ spinal precautions. PT Short Term Goal 2 (Week 1): Pt will perform STS with Min A using RW. PT Short Term Goal 3 (Week 1): Pt will perform SPVT with consistent Min A and reduced pain using RW. PT Short Term Goal 4 (Week 1): Pt will ambulate at least 20 feet with Min A using RW.  Skilled Therapeutic Interventions/Progress Updates:   Pt received supine in bed and denies PT at this time due to severe neck pain. PT returned in 30 min and pt agreeable to PT at bed level.   SAQ x 10 Hip abduction with manual resistance x 12 Ankle PF/DF x 15  SLR x 8 Hip/knee flexion/extension with manual resistance x 10  Isometric hip adduction x 12  Cues from PT to improved ROM as tolerated as well as decrease speed of eccentric movements throughout supine therex.   Pt left in bed with call bell in reach and all needs met.         Therapy Documentation Precautions:  Precautions Precautions: Cervical, Fall Required Braces or Orthoses: Cervical Brace Cervical Brace: Hard collar, At all times Restrictions Weight Bearing Restrictions: No General: PT Amount of Missed Time (min): 36 Minutes PT Missed Treatment Reason: Pain Vital Signs: Therapy Vitals Temp: 98.1 F (36.7 C) Pulse Rate: 73 Resp: 19 BP: (!) 139/58 Patient Position (if appropriate): Sitting Oxygen Therapy SpO2: 99 % O2 Device: Room Air Pain: Pain Assessment Pain Scale: 0-10 Pain Score: 7  Pain Type: Surgical pain Pain Location: Neck Pain Descriptors / Indicators: Discomfort Pain Frequency: Intermittent Pain Onset: Gradual Pain Intervention(s): Medication (See  eMAR)    Therapy/Group: Individual Therapy  Lorie Phenix 07/11/2021, 4:34 PM

## 2021-07-11 NOTE — Progress Notes (Signed)
Occupational Therapy Session Note  Patient Details  Name: Jared Bridges MRN: [id number] Date of Birth: 11-11-1930  Today's Date: 07/11/2021 OT Individual Time: 1400-1425 OT Individual Time Calculation (min): 25 min    Short Term Goals: Week 1:  OT Short Term Goal 1 (Week 1): Pt will engage in functional activity for 3 minutes without rest to increase activity tolerance required for ADLs. OT Short Term Goal 2 (Week 1): Pt will complete LB dressing with mod assist, using AE prn. OT Short Term Goal 3 (Week 1): Pt will complete toilet transfer with min assist.  Skilled Therapeutic Interventions/Progress Updates:    Pt resting in w/c upon arrival with daughter and wife present. Pt reports that he is not feeling as well as earlier in the day. Pt also c/o HA (see below). Pt had been sitting in w/c since 1100. Sit>stand from w/c with mod A. Squat pivot transfer to EOB with mod A. Pt able to bring BLE onto bed without assistance. Pt incontinent of bladder (pt with absent awareness when he has to urinate but can detect when he is urinating). Dependent for hygiene and threading pants. Pt able to bridge sufficiently in bed to facilitate pulling pants over hips. Pt remained in bed with all needs within reach and bed alarm activated. Wife and daughter present.   Therapy Documentation Precautions:  Precautions Precautions: Cervical, Fall Required Braces or Orthoses: Cervical Brace Cervical Brace: Hard collar, At all times Restrictions Weight Bearing Restrictions: No Pain:  Pt c/o headache (unrated); returned to bed and RN notified   Therapy/Group: Individual Therapy  [name] 07/11/2021, 2:34 PM

## 2021-07-11 NOTE — Progress Notes (Signed)
Physical Therapy Session Note  Patient Details  Name: Jared Bridges MRN: [id number] Date of Birth: July 05, 1930  Today's Date: 07/11/2021 PT Individual Time: [id number] PT Individual Time Calculation (min): 26 min   Short Term Goals: Week 1:  PT Short Term Goal 1 (Week 1): Pt will perform bed mobility with consistent Min A following cervical/ spinal precautions. PT Short Term Goal 2 (Week 1): Pt will perform STS with Min A using RW. PT Short Term Goal 3 (Week 1): Pt will perform SPVT with consistent Min A and reduced pain using RW. PT Short Term Goal 4 (Week 1): Pt will ambulate at least 20 feet with Min A using RW.  Skilled Therapeutic Interventions/Progress Updates:     Pt received supine in bed and agrees to therapy. Reports some pain in neck but number not provided. PT provides rest breaks and repositioning to manage pain. Pt performs supine bridge to assist with donning shorts prior to mobility. Supine to sit with minA and cues for logrolling technique. Stand step transfer from bed to [facility] with modA and no AD. WC transport to gym for time management. Pt performs multiple bouts of ambulation in parallel bars 3x8'. Pt performs sit to stand with CGA/minA multiple reps. PT provides minA/modA at hips during ambulation with +2 close WC follow for safety due to pt history of knee buckling. Pt does not experience any buckling during session but at times appears very close to buckling. WC transport back to room. Stand pivot transfer back to bed with modA. ModA for sit to supine. Left with alarm intact and all needs within reach.  Therapy Documentation Precautions:  Precautions Precautions: Cervical, Fall Required Braces or Orthoses: Cervical Brace Cervical Brace: Hard collar, At all times Restrictions Weight Bearing Restrictions: No    Therapy/Group: Individual Therapy  [name], PT, DPT 07/11/2021, 4:01 PM

## 2021-07-11 NOTE — Progress Notes (Signed)
Occupational Therapy Session Note  Patient Details  Name: Jared Bridges MRN: [id number] Date of Birth: [birth year]  Today's Date: 07/12/2021 OT Individual Time: 8403-9795 OT Individual Time Calculation (min): 27 min   Short Term Goals: Week 1:  OT Short Term Goal 1 (Week 1): Pt will engage in functional activity for 3 minutes without rest to increase activity tolerance required for ADLs. OT Short Term Goal 1 - Progress (Week 1): Met OT Short Term Goal 2 (Week 1): Pt will complete LB dressing with mod assist, using AE prn. OT Short Term Goal 2 - Progress (Week 1): Progressing toward goal OT Short Term Goal 3 (Week 1): Pt will complete toilet transfer with min assist. OT Short Term Goal 3 - Progress (Week 1): Progressing toward goal  Skilled Therapeutic Interventions/Progress Updates:    Pt greeted in the w/c via PT handoff. He requested to return to bed due to fatigue. Mod A +2 assist for squat pivot<bed with vcs. Mod A to return to supine. With HOB elevated, guided pt through [facility] forward and overhead arm circles x10 reps both directions x2 sets, Mod facilitation with Rt UE, Max facilitation with the Lt. B UE strengthening with resistive push/pulls, pt with more Rt UE strength vs Lt. He remained in bed at close of session, all needs within reach and bed alarm set. Tx focus placed on UE strengthening/ROM for improving functional skills in routine activities.   Therapy Documentation Precautions:  Precautions Precautions: Cervical, Fall Required Braces or Orthoses: Cervical Brace Cervical Brace: Hard collar, At all times Restrictions Weight Bearing Restrictions: No   Pain: RN in to provide pain medicine during session, CGA for sitting balance EOB while taking medicine   ADL:       Therapy/Group: Individual Therapy  [name] [name] 07/12/2021, 4:21 PM

## 2021-07-12 DIAGNOSIS — S14127D Central cord syndrome at C7 level of cervical spinal cord, subsequent encounter: Secondary | ICD-10-CM | POA: Diagnosis not present

## 2021-07-12 DIAGNOSIS — R739 Hyperglycemia, unspecified: Secondary | ICD-10-CM | POA: Diagnosis not present

## 2021-07-12 LAB — GLUCOSE, CAPILLARY
Glucose-Capillary: 142 mg/dL — ABNORMAL HIGH (ref 70–99)
Glucose-Capillary: 143 mg/dL — ABNORMAL HIGH (ref 70–99)
Glucose-Capillary: 157 mg/dL — ABNORMAL HIGH (ref 70–99)
Glucose-Capillary: 167 mg/dL — ABNORMAL HIGH (ref 70–99)

## 2021-07-12 MED ORDER — TRAZODONE HCL 50 MG PO TABS
75.0000 mg | ORAL_TABLET | Freq: Every day | ORAL | Status: DC
  Administered 2021-07-12 – 2021-07-14 (×3): 75 mg via ORAL
  Filled 2021-07-12 (×3): qty 2

## 2021-07-12 NOTE — Progress Notes (Signed)
Occupational Therapy Weekly Progress Note  Patient Details  Name: Jared Bridges MRN: [id number] Date of Birth: [birth year]  Beginning of progress report period: July 06, 2021 End of progress report period: July 12, 2021   Patient has met 1 of 3 short term goals.  Pt has made slow but steady progress with BADLs, functional tranfsers, and activity tolerance since admission. Pt currently requires mod A for bathing at shower level. Pt requires max A for UB dressing and tot A for LB dressing. Stand pivot transfers with mod A and max verbal cues for sequencing and upright posture. Sitting balance with CGA. Pt with forward head with flexion partially because of cervical collar. Pt with significant rounded shoulders when sitting. Pt fatigues easily and requires multiple rest breaks during session.   Patient continues to demonstrate the following deficits: muscle weakness and muscle paralysis, decreased cardiorespiratoy endurance, unbalanced muscle activation and decreased coordination, and decreased sitting balance, decreased standing balance, decreased postural control, decreased balance strategies, and difficulty maintaining precautions and therefore will continue to benefit from skilled OT intervention to enhance overall performance with BADL.  Patient progressing toward long term goals..  Continue plan of care.  OT Short Term Goals Week 1:  OT Short Term Goal 1 (Week 1): Pt will engage in functional activity for 3 minutes without rest to increase activity tolerance required for ADLs. OT Short Term Goal 1 - Progress (Week 1): Met OT Short Term Goal 2 (Week 1): Pt will complete LB dressing with mod assist, using AE prn. OT Short Term Goal 2 - Progress (Week 1): Progressing toward goal OT Short Term Goal 3 (Week 1): Pt will complete toilet transfer with min assist. OT Short Term Goal 3 - Progress (Week 1): Progressing toward goal Week 2:  OT Short Term Goal 1 (Week 2): Pt will complete LB dressing  with mod assist, using AE prn. OT Short Term Goal 2 (Week 2): Pt will complete toilet transfer with min assist. OT Short Term Goal 3 (Week 2): Pt will complete toileting with mod A    [name] 07/12/2021, 3:27 PM

## 2021-07-12 NOTE — Progress Notes (Signed)
Physical Therapy Session Note  Patient Details  Name: Jared Bridges MRN: [id number] Date of Birth: 25-Aug-1930  Today's Date: 07/12/2021 PT Individual Time: 1300-1400 PT Individual Time Calculation (min): 60 min   Short Term Goals: Week 1:  PT Short Term Goal 1 (Week 1): Pt will perform bed mobility with consistent Min A following cervical/ spinal precautions. PT Short Term Goal 2 (Week 1): Pt will perform STS with Min A using RW. PT Short Term Goal 3 (Week 1): Pt will perform SPVT with consistent Min A and reduced pain using RW. PT Short Term Goal 4 (Week 1): Pt will ambulate at least 20 feet with Min A using RW. Week 2:     Skilled Therapeutic Interventions/Progress Updates:    Pt initially oob in wc and agreeable to session.  States he has had a very good day today reports having first shower and first BM!  Very pleased w/both.   Transported to gym for gait training in [place] .  Harness donned/adjusted by therapist in sitting/standing, min assist w/sts to litegait and cga holding to litegait frame in standing w/careful attention to knees due to tendency to buckle.  Gait training as follows: 12f x 1, 1843fw/approx 25% bodywt support second trial due to increased manual wt shift assist required w/lesser amts, tends to scissor, L knee tends to hyperextend, trendelenberg L>R, severe forward head posture, generalized flexed posture which he can improve w/cues.  [place] up, removal of harness, and w/retrieval of wc, work on upright posture during these times.  Knees do buckle w/fatigue.  Family arrived at end of session.  Educated on current functional status, purpose and benefits of LiteGait training, discussed recommendation for chair lift w/wife/daughter EmRaquel Sarnaho is exploring options for this and other modifications.   Pt handed off to OT at end of session.     Therapy Documentation Precautions:  Precautions Precautions: Cervical, Fall Required  Braces or Orthoses: Cervical Brace Cervical Brace: Hard collar, At all times Restrictions Weight Bearing Restrictions: No     Therapy/Group: Individual Therapy BaCallie FieldingPTFox Chapel/10/2021, 3:40 PM

## 2021-07-12 NOTE — Progress Notes (Signed)
Occupational Therapy Session Note  Patient Details  Name: Jared Bridges MRN: [id number] Date of Birth: 1930-08-24  Today's Date: 07/12/2021 OT Individual Time: 0900-1026 OT Individual Time Calculation (min): 86 min    Short Term Goals: Week 1:  OT Short Term Goal 1 (Week 1): Pt will engage in functional activity for 3 minutes without rest to increase activity tolerance required for ADLs. OT Short Term Goal 2 (Week 1): Pt will complete LB dressing with mod assist, using AE prn. OT Short Term Goal 3 (Week 1): Pt will complete toilet transfer with min assist.  Skilled Therapeutic Interventions/Progress Updates:    Pt resting in bed upon arrival. OT intervention with focus on bed mobility, sitting balance, bathing at shower level, dressing at bed level, activity tolerance, and BUE function for ADLs. Transfers this morning with Stedy (mod A) for time mgmt and focus on ADLs. Pt requested to use BSC for BM. Pt dependent for hygiene. Bathing at shower level in rolling shower chair with max A. Pt able to wash hair without assistance. Pt required max A for UB dressing at bed level. Pt returned to bed to change pads on cervical collar. Pt requested to remain in bed because shower was "exhausting." Supine<>sit EOB with min A. Rolling in bed using bed rails with min A. Pt remained in bed with all needs within reach and bed alarm activated.   Therapy Documentation Precautions:  Precautions Precautions: Cervical, Fall Required Braces or Orthoses: Cervical Brace Cervical Brace: Hard collar, At all times Restrictions Weight Bearing Restrictions: No  Pain: Pain Assessment Pain Scale: 0-10 Pain Score: 0-No pain   Therapy/Group: Individual Therapy  [name] 07/12/2021, 10:28 AM

## 2021-07-12 NOTE — Progress Notes (Signed)
PROGRESS NOTE   Subjective/Complaints:  Pt reports runs out of steam around 2pm- most days, but not doing "too bad".  Pain 0/10 currently at rest.  Didn't sleep at all last night, esp with being woken up q2 hours.  Wants to sleep better   ROS:   Pt denies SOB, abd pain, CP, N/V/C/D, and vision changes       Objective:   No results found. No results for input(s): WBC, HGB, HCT, PLT in the last 72 hours.  No results for input(s): NA, K, CL, CO2, GLUCOSE, BUN, CREATININE, CALCIUM in the last 72 hours.   Intake/Output Summary (Last 24 hours) at 07/12/2021 1319 Last data filed at 07/12/2021 0700 Gross per 24 hour  Intake 360 ml  Output --  Net 360 ml        Physical Exam: Vital Signs Blood pressure (!) 117/52, pulse 66, temperature 97.8 F (36.6 C), temperature source Oral, resp. rate 16, height '6\' 2"'$  (1.88 m), weight 91.3 kg, SpO2 99 %.     General: awake, alert, appropriate, laying supine in bed; initially sleepy- woke easily; NAD HENT: conjugate gaze; oropharynx moist CV: regular rate; no JVD Pulmonary: CTA B/L; no W/R/R- good air movement- sounds great today actually GI: soft, NT, ND, (+)BS- normoactive Psychiatric: appropriate Neurological: Ox3; sleeping initially, but woke easily.   Extremities:  no LE edema seen;  Pulses are 2+- wearing SCDs-pumping currently Psych: Pt's affect is appropriate. Pt is cooperative Skin: old tka incisions Neuro:  Alert and oriented x 3. Normal insight and awareness. Intact Memory. Normal language and speech. Cranial nerve exam unremarkable RUE 5/5 deltoid, biceps, wrist, 3+ to 4- triceps, 3+ to 4- HI. LUE: 5/5 deltoids, biceps, wrist and 3 triceps and 3- HI. LE motor 4- to 4/5 prox to distal. Decreased LT in both upper extremities. DTR's trace Musculoskeletal: improved sitting posture, weaing cervical collar   Assessment/Plan: 1. Functional deficits which require 3+  hours per day of interdisciplinary therapy in a comprehensive inpatient rehab setting. Physiatrist is providing close team supervision and 24 hour management of active medical problems listed below. Physiatrist and rehab team continue to assess barriers to discharge/monitor patient progress toward functional and medical goals  Care Tool:  Bathing    Body parts bathed by patient: Face, Abdomen, Chest, Right upper leg, Left upper leg, Left arm   Body parts bathed by helper: Right arm, Front perineal area, Buttocks, Right lower leg, Left lower leg     Bathing assist Assist Level: Maximal Assistance - Patient 24 - 49%     Upper Body Dressing/Undressing Upper body dressing   What is the patient wearing?: Pull over shirt    Upper body assist Assist Level: Maximal Assistance - Patient 25 - 49%    Lower Body Dressing/Undressing Lower body dressing      What is the patient wearing?: Incontinence brief     Lower body assist Assist for lower body dressing: Dependent - Patient 0%     Toileting Toileting Toileting Activity did not occur (Clothing management and hygiene only): N/A (no void or bm)  Toileting assist Assist for toileting: Moderate Assistance - Patient 50 - 74%  Transfers Chair/bed transfer  Transfers assist     Chair/bed transfer assist level: Maximal Assistance - Patient 25 - 49%     Locomotion Ambulation   Ambulation assist   Ambulation activity did not occur: Safety/medical concerns  Assist level: Dependent - Patient 0% Assistive device: Lite Gait Max distance: 80   Walk 10 feet activity   Assist  Walk 10 feet activity did not occur: Safety/medical concerns  Assist level: Dependent - Patient 0% Assistive device: Lite Gait   Walk 50 feet activity   Assist Walk 50 feet with 2 turns activity did not occur: Safety/medical concerns  Assist level: Dependent - Patient 0% Assistive device: Lite Gait    Walk 150 feet activity   Assist Walk  150 feet activity did not occur: Safety/medical concerns         Walk 10 feet on uneven surface  activity   Assist Walk 10 feet on uneven surfaces activity did not occur: Safety/medical concerns         Wheelchair     Assist Will patient use wheelchair at discharge?: No Type of Wheelchair: Manual    Wheelchair assist level: Dependent - Patient 0% Max wheelchair distance: 10 ft    Wheelchair 50 feet with 2 turns activity    Assist    Wheelchair 50 feet with 2 turns activity did not occur: Safety/medical concerns   Assist Level: Dependent - Patient 0%   Wheelchair 150 feet activity     Assist  Wheelchair 150 feet activity did not occur: Safety/medical concerns       Blood pressure (!) 117/52, pulse 66, temperature 97.8 F (36.6 C), temperature source Oral, resp. rate 16, height '6\' 2"'$  (1.88 m), weight 91.3 kg, SpO2 99 %.  Medical Problem List and Plan: 1.  Functional and mobility deficits secondary to fall with C6/7 fx and EDH and subsequent cervical cord compression at that level. He is a C7 Central cord, left side more affected than right.             -patient may not yet shower             -ELOS/Goals: 10-12 days, supervision to min assist             -Therapy reported that collar is uncomfortably forcing him into flexion when he is out of bed. I have asked [facility] to assess for replacement collar which will keep him in more neutral position.  Con't PT and OT- CIR- con't cervical collar- can have shower- if covers incision with tegaderm/telfa. Cannot have Estim due to prostate CA.   -con't PT and OT- CIR-  2.  Antithrombotics: -DVT/anticoagulation:  Mechanical: Sequential compression devices, below knee Bilateral lower extremities             -antiplatelet therapy: N/A 3. Pain Management: Continue tylenol TID with Oxycodone prn  8/12- pain zero this AM-c on't regimen for therapy 4. Mood: LCSW to follow for evaluation and support/               -antipsychotic agents: N/A 5. Neuropsych: This patient is capable of making decisions on his own behalf. 6. Skin/Wound Care: routine pressure relief measures.  7. Fluids/Electrolytes/Nutrition: Monitor I/O. Check BMET on 08/08 8. HTN: actually soft--observe for now 9. T2DM: Monitor BS ac/hs. Use SSI for elevated BS  8/8- BG's 195-235- if continues 1 more day, will change meds  8/9- not on anything except SSI- will check A1c as well as change to carb modified  diet- not on any supplements, fyi  8/10- A1c is 6.4-   8/12- BG's 105-180- pt could benefit from metformin- will d/w him 10. PMR with peripheral neuropathy: continue Topamax, Lyrica, 11. Prostate cancer w/bony mets:  Treated with XRT 01/2021 at [facility] --On Xandi with proscar 12. Wet voice: intermittent. Sometimes hard to clear with collar and positioning  8/8- sounds like has rhonchi- will order flutter valve and have him sitting up for all meals- 8/9- lungs MUCH better today- sounds great- con't flutter valve and reminded pt to use.    8/10- ordered bedside suction and albuterol nebs- asked for them to be given this AM- also helped pt use Flutter valve x5x- sounded better afterwards, but still needs neb. 8/12- sounds great today with sitting up for meals.    13. H/o depression: ON Effexor.  14. Constipation: no recorded bm since being in the hospital  -sorbitol and SSE today  8/10- LBM 8/7- will give Sorbitol after therapy to help him have BM.   8/11- loose/runny stools, due to Sorbitol- will make miralax prn and con't Senokot, but change to restart tomorrow.  15. Urinary incontinence:  -timed voids  -check PVR's  8/10- so far, Bladder scans are <300cc- in last 24 hours- didn't see any measured prior- so will continue.  16. Insomnia  8/12- will increase trazodone to 75 mg QHS and monitor- esp if has any urinary retention.   LOS: 7 days A FACE TO FACE EVALUATION WAS PERFORMED  Adreena Willits 07/12/2021, 1:19 PM

## 2021-07-13 DIAGNOSIS — S14127D Central cord syndrome at C7 level of cervical spinal cord, subsequent encounter: Secondary | ICD-10-CM | POA: Diagnosis not present

## 2021-07-13 DIAGNOSIS — R32 Unspecified urinary incontinence: Secondary | ICD-10-CM

## 2021-07-13 DIAGNOSIS — E1165 Type 2 diabetes mellitus with hyperglycemia: Secondary | ICD-10-CM

## 2021-07-13 DIAGNOSIS — F329 Major depressive disorder, single episode, unspecified: Secondary | ICD-10-CM | POA: Diagnosis not present

## 2021-07-13 LAB — GLUCOSE, CAPILLARY
Glucose-Capillary: 151 mg/dL — ABNORMAL HIGH (ref 70–99)
Glucose-Capillary: 143 mg/dL — ABNORMAL HIGH (ref 70–99)
Glucose-Capillary: 184 mg/dL — ABNORMAL HIGH (ref 70–99)
Glucose-Capillary: 154 mg/dL — ABNORMAL HIGH (ref 70–99)

## 2021-07-13 MED ORDER — METFORMIN HCL 500 MG PO TABS
250.0000 mg | ORAL_TABLET | Freq: Every day | ORAL | Status: DC
  Administered 2021-07-14 – 2021-07-15 (×2): 250 mg via ORAL
  Filled 2021-07-13 (×2): qty 1

## 2021-07-13 NOTE — Progress Notes (Signed)
Physical Therapy Session Note  Patient Details  Name: Jared Bridges MRN: [id number] Date of Birth: September 23, 1930  Today's Date: 07/13/2021 PT Individual Time: 0903-0149 PT Individual Time Calculation (min): 58 min   Short Term Goals: Week 1:  PT Short Term Goal 1 (Week 1): Pt will perform bed mobility with consistent Min A following cervical/ spinal precautions. PT Short Term Goal 2 (Week 1): Pt will perform STS with Min A using RW. PT Short Term Goal 3 (Week 1): Pt will perform SPVT with consistent Min A and reduced pain using RW. PT Short Term Goal 4 (Week 1): Pt will ambulate at least 20 feet with Min A using RW.  Skilled Therapeutic Interventions/Progress Updates:    Pt received asleep in bed, easily roused by voice. Pt agreeable to therapy.   Supine > EOB with HOB elevated ModA for assist with transitioning hips and trunk. Neck brace tightened EOB. Pt required min-modA 2/2 posterior lean sitting EOB while therapist threaded BLE through pants. STS throughout session MinA for steadying 2/2 bilateral knee weakness.   Stand step pivot with RW bed> TIS wc MinA for steadying.   Gait training 2x164f in litegait with +2 steering, 25+% body weight support. Min manual facilitation of RLE abduction to prevent scissoring gait. Medium resistance cables utilized to facilitate hip extension. Forwards flexed posture minimally corrected with verbal cuing. Pt expresses how happy he is with the litegait and the distance he was able to walk. Seated rest break in between bouts with ModA for balance 2/2 posterior lean to make eye contact.   Donned harness in standing with BUE support and MinA for balance. While unclipping litegait, quick drop ~4 inches to TIS wc 2/2 pt attempting to sit prior to last hook removed despite therapist providing left knee block. Pt reports dizziness that dissipated with rest, however, denies pain stating "I will be fine."    Stand pivot TIS wc>bed no AD heavy modA (MinA from +2  behind for safety) with sudden bilateral knee buckle. EOB > supine MaxA for BLE management and control of trunk.   Pt in bed with bed alarm set and call bell/tray within reach. All needs met at this time.   Therapy Documentation Precautions:  Precautions Precautions: Cervical, Fall Required Braces or Orthoses: Cervical Brace Cervical Brace: Hard collar, At all times Restrictions Weight Bearing Restrictions: No  Pain: Pain Assessment Pain Scale: 0-10 Pain Score: 0-No pain Faces Pain Scale: No hurt  Therapy/Group: Individual Therapy  Teiara Baria, SPT  07/13/2021, 2:14 PM

## 2021-07-13 NOTE — Plan of Care (Signed)
  Problem: Consults Goal: RH GENERAL PATIENT EDUCATION Description: See Patient Education module for education specifics. Outcome: Progressing Goal: Diabetes Guidelines if Diabetic/Glucose > 140 Description: If diabetic or lab glucose is > 140 mg/dl - Initiate Diabetes/Hyperglycemia Guidelines & Document Interventions  Outcome: Progressing   Problem: RH SKIN INTEGRITY Goal: RH STG ABLE TO PERFORM INCISION/WOUND CARE W/ASSISTANCE Description: STG Able To Perform Incision/Wound Care With min Assistance. Outcome: Progressing   Problem: RH SAFETY Goal: RH STG ADHERE TO SAFETY PRECAUTIONS W/ASSISTANCE/DEVICE Description: STG Adhere to Safety Precautions With Cues and Reminders. Outcome: Progressing Goal: RH STG DECREASED RISK OF FALL WITH ASSISTANCE Description: STG Decreased Risk of Fall With min Assistance. Outcome: Progressing   Problem: RH PAIN MANAGEMENT Goal: RH STG PAIN MANAGED AT OR BELOW PT'S PAIN GOAL Description: < 3 on a 0-10 pain scale. Outcome: Progressing   Problem: RH KNOWLEDGE DEFICIT GENERAL Goal: RH STG INCREASE KNOWLEDGE OF SELF CARE AFTER HOSPITALIZATION Description: Patient will demonstrate knowledge of medication management, pain management, safety awareness with educational materials and handouts provided by staff independently at discharge. Outcome: Progressing

## 2021-07-13 NOTE — Progress Notes (Signed)
PROGRESS NOTE   Subjective/Complaints: Patient seen sitting up in bed this morning.  He states he slept fairly overnight due to discomfort from c-collar.  ROS: Denies CP, SOB, N/V/D  Objective:   No results found. No results for input(s): WBC, HGB, HCT, PLT in the last 72 hours.  No results for input(s): NA, K, CL, CO2, GLUCOSE, BUN, CREATININE, CALCIUM in the last 72 hours.   Intake/Output Summary (Last 24 hours) at 07/13/2021 1525 Last data filed at 07/13/2021 0819 Gross per 24 hour  Intake 480 ml  Output --  Net 480 ml         Physical Exam: Vital Signs Blood pressure (!) 129/59, pulse 68, temperature 98.6 F (37 C), temperature source Oral, resp. rate 17, height '6\' 2"'$  (1.88 m), weight 92.9 kg, SpO2 98 %. Constitutional: No distress . Vital signs reviewed. HENT: Normocephalic.  Atraumatic. Neck: + C-collar. Eyes: EOMI. No discharge. Cardiovascular: No JVD.  RRR. Respiratory: Normal effort.  No stridor.  Bilateral clear to auscultation. GI: Non-distended.  BS +. Skin: Warm and dry.  Intact. Psych: Normal mood.  Normal behavior. Musc: No edema in extremities.  No tenderness in extremities. Neuro: Alert Motor: 4/5 throughout  Assessment/Plan: 1. Functional deficits which require 3+ hours per day of interdisciplinary therapy in a comprehensive inpatient rehab setting. Physiatrist is providing close team supervision and 24 hour management of active medical problems listed below. Physiatrist and rehab team continue to assess barriers to discharge/monitor patient progress toward functional and medical goals  Care Tool:  Bathing    Body parts bathed by patient: Face, Abdomen, Chest, Right upper leg, Left upper leg, Left arm   Body parts bathed by helper: Right arm, Front perineal area, Buttocks, Right lower leg, Left lower leg     Bathing assist Assist Level: Maximal Assistance - Patient 24 - 49%     Upper  Body Dressing/Undressing Upper body dressing   What is the patient wearing?: Pull over shirt    Upper body assist Assist Level: Maximal Assistance - Patient 25 - 49%    Lower Body Dressing/Undressing Lower body dressing      What is the patient wearing?: Incontinence brief     Lower body assist Assist for lower body dressing: Dependent - Patient 0%     Toileting Toileting Toileting Activity did not occur (Clothing management and hygiene only): N/A (no void or bm)  Toileting assist Assist for toileting: Moderate Assistance - Patient 50 - 74%     Transfers Chair/bed transfer  Transfers assist     Chair/bed transfer assist level: Maximal Assistance - Patient 25 - 49%     Locomotion Ambulation   Ambulation assist   Ambulation activity did not occur: Safety/medical concerns  Assist level: Dependent - Patient 0% Assistive device: Lite Gait Max distance: 80   Walk 10 feet activity   Assist  Walk 10 feet activity did not occur: Safety/medical concerns  Assist level: Dependent - Patient 0% Assistive device: Lite Gait   Walk 50 feet activity   Assist Walk 50 feet with 2 turns activity did not occur: Safety/medical concerns  Assist level: Dependent - Patient 0% Assistive device: Lite Gait  Walk 150 feet activity   Assist Walk 150 feet activity did not occur: Safety/medical concerns         Walk 10 feet on uneven surface  activity   Assist Walk 10 feet on uneven surfaces activity did not occur: Safety/medical concerns         Wheelchair     Assist Will patient use wheelchair at discharge?: No Type of Wheelchair: Manual    Wheelchair assist level: Dependent - Patient 0% Max wheelchair distance: 10 ft    Wheelchair 50 feet with 2 turns activity    Assist    Wheelchair 50 feet with 2 turns activity did not occur: Safety/medical concerns   Assist Level: Dependent - Patient 0%   Wheelchair 150 feet activity     Assist   Wheelchair 150 feet activity did not occur: Safety/medical concerns       Blood pressure (!) 129/59, pulse 68, temperature 98.6 F (37 C), temperature source Oral, resp. rate 17, height '6\' 2"'$  (1.88 m), weight 92.9 kg, SpO2 98 %.  Medical Problem List and Plan: 1.  Functional and mobility deficits secondary to fall with C6/7 fx and EDH and subsequent cervical cord compression at that level. He is a C7 Central cord, left side more affected than right.  Continue CIR 2.  Antithrombotics: -DVT/anticoagulation:  Mechanical: Sequential compression devices, below knee Bilateral lower extremities             -antiplatelet therapy: N/A 3. Pain Management: Continue tylenol TID with Oxycodone prn  8/12- pain zero this AM-c on't regimen for therapy 4. Mood: LCSW to follow for evaluation and support/              -antipsychotic agents: N/A 5. Neuropsych: This patient is capable of making decisions on his own behalf. 6. Skin/Wound Care: routine pressure relief measures.  7. Fluids/Electrolytes/Nutrition: Monitor I/Os. Check BMET on 08/08 8. HTN:   Controlled on 8/13 9. T2DM: Monitor BS ac/hs. Use SSI for elevated BS  Changed to carb modified diet-  8/10- A1c is 6.4  Metformin started on 8/14 10. PMR with peripheral neuropathy: continue Topamax, Lyrica, 11. Prostate cancer w/bony mets:  Treated with XRT 01/2021 at [facility] --On Xandi with proscar 12. Wet voice: intermittent. Sometimes hard to clear with collar and positioning  Flutter valve ordered Albuterol nebs- 13. H/o depression: ON Effexor.  14. Constipation:   Improving 15. Urinary incontinence:  -timed voids  -check PVR's  Improving.  16. Insomnia  8/12- will increase trazodone to 75 mg QHS and monitor- esp if has any urinary retention.   LOS: 8 days A FACE TO FACE EVALUATION WAS PERFORMED  Karrisa Didio Lorie Phenix 07/13/2021, 3:25 PM

## 2021-07-14 ENCOUNTER — Other Ambulatory Visit (INDEPENDENT_AMBULATORY_CARE_PROVIDER_SITE_OTHER): Payer: Self-pay | Admitting: Internal Medicine

## 2021-07-14 DIAGNOSIS — S14127D Central cord syndrome at C7 level of cervical spinal cord, subsequent encounter: Secondary | ICD-10-CM | POA: Diagnosis not present

## 2021-07-14 DIAGNOSIS — E1165 Type 2 diabetes mellitus with hyperglycemia: Secondary | ICD-10-CM | POA: Diagnosis not present

## 2021-07-14 DIAGNOSIS — I10 Essential (primary) hypertension: Secondary | ICD-10-CM | POA: Diagnosis not present

## 2021-07-14 DIAGNOSIS — D62 Acute posthemorrhagic anemia: Secondary | ICD-10-CM | POA: Diagnosis not present

## 2021-07-14 LAB — GLUCOSE, CAPILLARY
Glucose-Capillary: 150 mg/dL — ABNORMAL HIGH (ref 70–99)
Glucose-Capillary: 148 mg/dL — ABNORMAL HIGH (ref 70–99)
Glucose-Capillary: 132 mg/dL — ABNORMAL HIGH (ref 70–99)
Glucose-Capillary: 132 mg/dL — ABNORMAL HIGH (ref 70–99)

## 2021-07-14 NOTE — Progress Notes (Signed)
PROGRESS NOTE   Subjective/Complaints: Patient seen laying in bed this AM.  He states he slept fairly well overnight.  He denies complaints.  ROS: Denies CP, SOB, N/V/D  Objective:   No results found. No results for input(s): WBC, HGB, HCT, PLT in the last 72 hours.  No results for input(s): NA, K, CL, CO2, GLUCOSE, BUN, CREATININE, CALCIUM in the last 72 hours.   Intake/Output Summary (Last 24 hours) at 07/14/2021 2236 Last data filed at 07/14/2021 1900 Gross per 24 hour  Intake 720 ml  Output 550 ml  Net 170 ml         Physical Exam: Vital Signs Blood pressure (!) 121/50, pulse 78, temperature 97.7 F (36.5 C), temperature source Oral, resp. rate 17, height '6\' 2"'$  (1.88 m), weight 73 kg, SpO2 99 %. Constitutional: No distress . Vital signs reviewed. HENT: Normocephalic.  Atraumatic. Neck: + C-collar. Eyes: EOMI. No discharge. Cardiovascular: No JVD.  RRR. Respiratory: Normal effort.  No stridor.  Bilateral clear to auscultation. GI: Non-distended.  BS +. Skin: Warm and dry.  Intact. Psych: Normal mood.  Normal behavior. Musc: No edema in extremities.  No tenderness in extremities. Neuro: Alert Motor: 4/5 throughout, unchanged  Assessment/Plan: 1. Functional deficits which require 3+ hours per day of interdisciplinary therapy in a comprehensive inpatient rehab setting. Physiatrist is providing close team supervision and 24 hour management of active medical problems listed below. Physiatrist and rehab team continue to assess barriers to discharge/monitor patient progress toward functional and medical goals  Care Tool:  Bathing    Body parts bathed by patient: Face, Abdomen, Chest, Right upper leg, Left upper leg, Left arm   Body parts bathed by helper: Right arm, Front perineal area, Buttocks, Right lower leg, Left lower leg     Bathing assist Assist Level: Maximal Assistance - Patient 24 - 49%     Upper  Body Dressing/Undressing Upper body dressing   What is the patient wearing?: Pull over shirt    Upper body assist Assist Level: Maximal Assistance - Patient 25 - 49%    Lower Body Dressing/Undressing Lower body dressing      What is the patient wearing?: Incontinence brief     Lower body assist Assist for lower body dressing: Dependent - Patient 0%     Toileting Toileting Toileting Activity did not occur (Clothing management and hygiene only): N/A (no void or bm)  Toileting assist Assist for toileting: Moderate Assistance - Patient 50 - 74%     Transfers Chair/bed transfer  Transfers assist     Chair/bed transfer assist level: Dependent - mechanical lift (stedy)     Locomotion Ambulation   Ambulation assist   Ambulation activity did not occur: Safety/medical concerns  Assist level: Dependent - Patient 0% Assistive device: Lite Gait Max distance: 80   Walk 10 feet activity   Assist  Walk 10 feet activity did not occur: Safety/medical concerns  Assist level: Dependent - Patient 0% Assistive device: Lite Gait   Walk 50 feet activity   Assist Walk 50 feet with 2 turns activity did not occur: Safety/medical concerns  Assist level: Dependent - Patient 0% Assistive device: Lite Gait  Walk 150 feet activity   Assist Walk 150 feet activity did not occur: Safety/medical concerns         Walk 10 feet on uneven surface  activity   Assist Walk 10 feet on uneven surfaces activity did not occur: Safety/medical concerns         Wheelchair     Assist Will patient use wheelchair at discharge?: No Type of Wheelchair: Manual    Wheelchair assist level: Dependent - Patient 0% Max wheelchair distance: 10 ft    Wheelchair 50 feet with 2 turns activity    Assist    Wheelchair 50 feet with 2 turns activity did not occur: Safety/medical concerns   Assist Level: Dependent - Patient 0%   Wheelchair 150 feet activity     Assist   Wheelchair 150 feet activity did not occur: Safety/medical concerns       Blood pressure (!) 121/50, pulse 78, temperature 97.7 F (36.5 C), temperature source Oral, resp. rate 17, height '6\' 2"'$  (1.88 m), weight 73 kg, SpO2 99 %.  Medical Problem List and Plan: 1.  Functional and mobility deficits secondary to fall with C6/7 fx and EDH and subsequent cervical cord compression at that level. He is a C7 Central cord, left side more affected than right.  Continue CIR 2.  Antithrombotics: -DVT/anticoagulation:  Mechanical: Sequential compression devices, below knee Bilateral lower extremities             -antiplatelet therapy: N/A 3. Pain Management: Continue tylenol TID with Oxycodone prn  8/12- pain zero this AM-c on't regimen for therapy 4. Mood: LCSW to follow for evaluation and support/              -antipsychotic agents: N/A 5. Neuropsych: This patient is capable of making decisions on his own behalf. 6. Skin/Wound Care: routine pressure relief measures.  7. Fluids/Electrolytes/Nutrition: Monitor I/Os. Check BMET on 08/08 8. HTN:   Controlled on 8/14 9. T2DM: Monitor BS ac/hs. Use SSI for elevated BS  Changed to carb modified diet-  8/10- A1c is 6.4  Metformin started on 8/14  Elevated on 8/14 10. PMR with peripheral neuropathy: continue Topamax, Lyrica, 11. Prostate cancer w/bony mets:  Treated with XRT 01/2021 at [facility] --On Xandi with proscar 12. Wet voice: intermittent. Sometimes hard to clear with collar and positioning  Flutter valve ordered Albuterol nebs 13. H/o depression: ON Effexor.  14. Constipation:   Improving 15. Urinary incontinence:  -timed voids  -check PVR's  Improving.  16. Insomnia  8/12- will increase trazodone to 75 mg QHS and monitor- esp if has any urinary retention.  17.  Acute blood loss anemia  Hemoglobin 9.3 on 8/8, labs ordered for tomorrow  LOS: 9 days A FACE TO FACE EVALUATION WAS PERFORMED  Jared Bridges Lorie Phenix 07/14/2021, 10:36 PM

## 2021-07-14 NOTE — Progress Notes (Signed)
Physical Therapy Session Note  Patient Details  Name: Jared Bridges MRN: [id number] Date of Birth: Feb 19, 1930  Today's Date: 07/14/2021 PT Individual Time: 0900-1000 PT Individual Time Calculation (min): 60 min   Short Term Goals: Week 1:  PT Short Term Goal 1 (Week 1): Pt will perform bed mobility with consistent Min A following cervical/ spinal precautions. PT Short Term Goal 2 (Week 1): Pt will perform STS with Min A using RW. PT Short Term Goal 3 (Week 1): Pt will perform SPVT with consistent Min A and reduced pain using RW. PT Short Term Goal 4 (Week 1): Pt will ambulate at least 20 feet with Min A using RW.   Skilled Therapeutic Interventions/Progress Updates:    Pt received supine in bed, agreeable to PT session. No complaints of pain, just reports being "aggravated" due to having trouble sleeping and frustration with weakness in his hands and BUE especially when attempting to use his cell phone. Will provide patient with phone holder and adaptive ways to use his cell phone. Assisted pt with donning pants at bed level with max A, pt able to bridge up for pants to be pulled up over hips. Supine to sit with max A for BLE management and trunk elevation. Sit to stand with min A +2 to stedy from elevated bed. Stedy transfer to [facility] chair. Sit to stand in // bars with min A with B knees blocked. Standing alt L/R lifts 3 x 10 reps. Pt exhibits fair control of BLE, narrow BOS when placing LE back on the floor. Pt also exhibits hip flexion, unable to correct with cues and hip flexion increases with onset of fatigue. Ambulation x 5 ft in // bars with min A increasing to mod A for balance, increase in posterior pushing with onset of fatigue. Pt exhibits very small step length and minimal hip and knee flexion when taking a step. Pt then reports urge to have a BM. Stedy transfer TIS chair to [facility]. Pt able to continently void BM while seated on BSC. Pt is dependent for pericare and clothing management in  standing. Pt with onset of BLE fatigue at end of session and needs assist x 2 to remain standing for pericare to be completed. Stedy transfer back to w/c. Pt left seated in TIS chair in room with needs in reach, quick release belt and chair alarm in place.  Therapy Documentation Precautions:  Precautions Precautions: Cervical, Fall Required Braces or Orthoses: Cervical Brace Cervical Brace: Hard collar, At all times Restrictions Weight Bearing Restrictions: No    Therapy/Group: Individual Therapy   Excell Seltzer, PT, DPT, CSRS  07/14/2021, 12:14 PM

## 2021-07-15 DIAGNOSIS — R739 Hyperglycemia, unspecified: Secondary | ICD-10-CM | POA: Diagnosis not present

## 2021-07-15 DIAGNOSIS — E1165 Type 2 diabetes mellitus with hyperglycemia: Secondary | ICD-10-CM | POA: Diagnosis not present

## 2021-07-15 DIAGNOSIS — S14127D Central cord syndrome at C7 level of cervical spinal cord, subsequent encounter: Secondary | ICD-10-CM | POA: Diagnosis not present

## 2021-07-15 DIAGNOSIS — N39490 Overflow incontinence: Secondary | ICD-10-CM | POA: Diagnosis not present

## 2021-07-15 LAB — GLUCOSE, CAPILLARY
Glucose-Capillary: 157 mg/dL — ABNORMAL HIGH (ref 70–99)
Glucose-Capillary: 155 mg/dL — ABNORMAL HIGH (ref 70–99)
Glucose-Capillary: 158 mg/dL — ABNORMAL HIGH (ref 70–99)
Glucose-Capillary: 111 mg/dL — ABNORMAL HIGH (ref 70–99)

## 2021-07-15 LAB — BASIC METABOLIC PANEL
Anion gap: 7 (ref 5–15)
BUN: 23 mg/dL (ref 8–23)
CO2: 25 mmol/L (ref 22–32)
Calcium: 9.1 mg/dL (ref 8.9–10.3)
Chloride: 106 mmol/L (ref 98–111)
Creatinine, Ser: 1.14 mg/dL (ref 0.61–1.24)
GFR, Estimated: >60 mL/min (ref >60)
Glucose, Bld: 167 mg/dL — ABNORMAL HIGH (ref 70–99)
Potassium: 3.8 mmol/L (ref 3.5–5.1)
Sodium: 138 mmol/L (ref 135–145)

## 2021-07-15 LAB — CBC
HCT: 34.6 % — ABNORMAL LOW (ref 39.0–52.0)
Hemoglobin: 11.1 g/dL — ABNORMAL LOW (ref 13.0–17.0)
MCH: 31.1 pg (ref 26.0–34.0)
MCHC: 32.1 g/dL (ref 30.0–36.0)
MCV: 96.9 fL (ref 80.0–100.0)
Platelets: 265 10*3/uL (ref 150–400)
RBC: 3.57 MIL/uL — ABNORMAL LOW (ref 4.22–5.81)
RDW: 13.9 % (ref 11.5–15.5)
WBC: 8 10*3/uL (ref 4.0–10.5)
nRBC: 0 % (ref 0.0–0.2)

## 2021-07-15 MED ORDER — METFORMIN HCL 500 MG PO TABS
250.0000 mg | ORAL_TABLET | Freq: Two times a day (BID) | ORAL | Status: DC
  Administered 2021-07-15 – 2021-07-26 (×22): 250 mg via ORAL
  Filled 2021-07-15 (×22): qty 1

## 2021-07-15 MED ORDER — ZOLPIDEM TARTRATE 5 MG PO TABS
5.0000 mg | ORAL_TABLET | Freq: Every day | ORAL | Status: DC
  Administered 2021-07-15 – 2021-07-25 (×11): 5 mg via ORAL
  Filled 2021-07-15 (×11): qty 1

## 2021-07-15 NOTE — Progress Notes (Signed)
Physical Therapy Session Note  Patient Details  Name: Jared Bridges MRN: [id number] Date of Birth: 1930/05/04  Today's Date: 07/15/2021 PT Individual Time: 1300-1330 PT Individual Time Calculation (min): 30 min  PT Amount of Missed Time (min): 30 Minutes PT Missed Treatment Reason: Patient ill (Comment) (dizzy, pain, fatigue)  Short Term Goals: Week 1:  PT Short Term Goal 1 (Week 1): Pt will perform bed mobility with consistent Min A following cervical/ spinal precautions. PT Short Term Goal 2 (Week 1): Pt will perform STS with Min A using RW. PT Short Term Goal 3 (Week 1): Pt will perform SPVT with consistent Min A and reduced pain using RW. PT Short Term Goal 4 (Week 1): Pt will ambulate at least 20 feet with Min A using RW.  Skilled Therapeutic Interventions/Progress Updates:    Pt received seated in TIS chair in room, reports feeling "dizzy" or that vision is "off" and "wavy". Pt also reports onset of a headache. Seated BP 125/71. Nursing notified of pt's symptoms and will bring patient pain medicine for headache. Assisted pt back to bed to rest. Sit to stand with mod A +2 to stedy. Stedy transfer back to bed. Sit to supine mod A needed for BLE management. Attempted to adjust Aspen collar as pt's head tends to sink down into collar due to decreased cervical extension. Unable to fit collar to patient properly due to limited neck ROM. Pt left seated in bed with needs in reach, bed alarm in place. Pt missed 30 min of scheduled therapy session due to dizziness, headache, and fatigue.  Therapy Documentation Precautions:  Precautions Precautions: Cervical, Fall Required Braces or Orthoses: Cervical Brace Cervical Brace: Hard collar, At all times Restrictions Weight Bearing Restrictions: No General: PT Amount of Missed Time (min): 30 Minutes PT Missed Treatment Reason: Patient ill (Comment) (dizzy, pain, fatigue)     Therapy/Group: Individual Therapy  Excell Seltzer, PT, DPT,  CSRS  07/15/2021, 5:11 PM

## 2021-07-15 NOTE — Progress Notes (Signed)
Physical Therapy Session Note  Patient Details  Name: Jared Bridges MRN: [id number] Date of Birth: 16-Feb-1930  Today's Date: 07/15/2021 PT Individual Time: 0945-1100 PT Individual Time Calculation (min): 75 min   Short Term Goals: Week 1:  PT Short Term Goal 1 (Week 1): Pt will perform bed mobility with consistent Min A following cervical/ spinal precautions. PT Short Term Goal 2 (Week 1): Pt will perform STS with Min A using RW. PT Short Term Goal 3 (Week 1): Pt will perform SPVT with consistent Min A and reduced pain using RW. PT Short Term Goal 4 (Week 1): Pt will ambulate at least 20 feet with Min A using RW.  Skilled Therapeutic Interventions/Progress Updates:    Pt received in day room at end of OT treatment seated in [place] chair. Pt agreeable to gait training with lite gait. Pt ambulated x27 ft, x 40 ft with lite gait and max cues for weightbearing and knee extension. Pt demoes sitting in sling while walking with crouched gait pattern. Gait improved with facilitation with at knee for extension. Attempted gait with RW and lite gait, pt reported dizziness and visual changes, which dissipated after extended rest break in sitting. Pt then directed in standing squats with lite gait support for improved strength and knee extension during gait. Pt returned to room and remained in w/c, was left with all needs in reach and alarm active.   Therapy Documentation Precautions:  Precautions Precautions: Cervical, Fall Required Braces or Orthoses: Cervical Brace Cervical Brace: Hard collar, At all times Restrictions Weight Bearing Restrictions: No    Therapy/Group: Individual Therapy  Mickel Fuchs 07/15/2021, 12:57 PM

## 2021-07-15 NOTE — Progress Notes (Signed)
Occupational Therapy Session Note  Patient Details  Name: Jared Bridges MRN: [id number] Date of Birth: June 08, 1930  Today's Date: 07/15/2021 OT Individual Time: [id number] OT Individual Time Calculation (min): 45 min    Short Term Goals: Week 2:  OT Short Term Goal 1 (Week 2): Pt will complete LB dressing with mod assist, using AE prn. OT Short Term Goal 2 (Week 2): Pt will complete toilet transfer with min assist. OT Short Term Goal 3 (Week 2): Pt will complete toileting with mod A  Skilled Therapeutic Interventions/Progress Updates:    Pt resting in w/c upon arrival. Pt transported to day room (time mgmt). Sit<>stand from w/c with min A. Pt unable to stand completely erect with significant shoulder rounding and flexed head/neck (with C collar). Pt stated he is really frustrated with BUE weakness. Strength test: dynamometer Rt-25#, Lt-15#; pinch strength-Rt-4#, Lt-3#. Lt elbow extension AROM lacking 20* but PROM WFL. Pt issued green foam for strengthening pinch strength. BUE therex with 1# bar-chest presses. Pt remained in w/c and handed off to PT.  Therapy Documentation Precautions:  Precautions Precautions: Cervical, Fall Required Braces or Orthoses: Cervical Brace Cervical Brace: Hard collar, At all times Restrictions Weight Bearing Restrictions: No  Pain: Pain Assessment Pain Scale: 0-10    Therapy/Group: Individual Therapy  [name] 07/15/2021, 10:48 AM

## 2021-07-15 NOTE — Progress Notes (Signed)
Scheduled Trazodone given last night. Patient restless throughout the night with difficulty staying asleep.

## 2021-07-15 NOTE — Progress Notes (Signed)
PROGRESS NOTE   Subjective/Complaints:  Pt reports he's not sleeping- hasn't for days more than 2-4 hours/night.  Trazodone not working- pt reports has tried Ambien in past- it worked- but gave him hangover in AM- will give at 8pm to help that.  Ate 95% of breakfast;  Hard to get comfortable at night/day due to soreness of neck/cervical collar.  LBM yesterday x2- 1 controlled and 1 large one while asleep.  Bladder OK.    ROS:  Pt denies SOB, abd pain, CP, N/V/C/D, and vision changes   Objective:   No results found. Recent Labs    07/15/21 0711  WBC 8.0  HGB 11.1*  HCT 34.6*  PLT 265    Recent Labs    07/15/21 0711  NA 138  K 3.8  CL 106  CO2 25  GLUCOSE 167*  BUN 23  CREATININE 1.14  CALCIUM 9.1     Intake/Output Summary (Last 24 hours) at 07/15/2021 1351 Last data filed at 07/15/2021 1316 Gross per 24 hour  Intake 1020 ml  Output 700 ml  Net 320 ml        Physical Exam: Vital Signs Blood pressure (!) 101/50, pulse 68, temperature 98.2 F (36.8 C), temperature source Oral, resp. rate 18, height '6\' 2"'$  (1.88 m), weight 73 kg, SpO2 100 %.    General: awake, alert, appropriate, sitting up in w/c this AM, 95% of breakfast eaten; NAD HENT: conjugate gaze; oropharynx moist; wearing cervical collar CV: regular rate; no JVD Pulmonary: a little coarse, but overall, much better than admission GI: soft, NT, ND, (+)BS; protuberant Psychiatric: appropriate; interactive Neurological: alert- using built up handles ot eat food.   Skin: Warm and dry.  Intact. Musc: No edema in extremities.  No tenderness in extremities. Neuro: Alert Motor: 4/5 throughout, unchanged  Assessment/Plan: 1. Functional deficits which require 3+ hours per day of interdisciplinary therapy in a comprehensive inpatient rehab setting. Physiatrist is providing close team supervision and 24 hour management of active medical problems  listed below. Physiatrist and rehab team continue to assess barriers to discharge/monitor patient progress toward functional and medical goals  Care Tool:  Bathing    Body parts bathed by patient: Face, Abdomen, Chest, Right upper leg, Left upper leg, Left arm   Body parts bathed by helper: Right arm, Front perineal area, Buttocks, Right lower leg, Left lower leg     Bathing assist Assist Level: Maximal Assistance - Patient 24 - 49%     Upper Body Dressing/Undressing Upper body dressing   What is the patient wearing?: Pull over shirt    Upper body assist Assist Level: Maximal Assistance - Patient 25 - 49%    Lower Body Dressing/Undressing Lower body dressing      What is the patient wearing?: Incontinence brief     Lower body assist Assist for lower body dressing: Dependent - Patient 0%     Toileting Toileting Toileting Activity did not occur (Clothing management and hygiene only): N/A (no void or bm)  Toileting assist Assist for toileting: Moderate Assistance - Patient 50 - 74%     Transfers Chair/bed transfer  Transfers assist     Chair/bed transfer assist level:  Dependent - mechanical lift (stedy)     Locomotion Ambulation   Ambulation assist   Ambulation activity did not occur: Safety/medical concerns  Assist level: Total Assistance - Patient < 25% Assistive device: Lite Gait Max distance: 40   Walk 10 feet activity   Assist  Walk 10 feet activity did not occur: Safety/medical concerns  Assist level: Total Assistance - Patient < 25% Assistive device: Lite Gait   Walk 50 feet activity   Assist Walk 50 feet with 2 turns activity did not occur: Safety/medical concerns  Assist level: Dependent - Patient 0% Assistive device: Lite Gait    Walk 150 feet activity   Assist Walk 150 feet activity did not occur: Safety/medical concerns         Walk 10 feet on uneven surface  activity   Assist Walk 10 feet on uneven surfaces activity  did not occur: Safety/medical concerns         Wheelchair     Assist Will patient use wheelchair at discharge?: No Type of Wheelchair: Manual    Wheelchair assist level: Dependent - Patient 0% Max wheelchair distance: 10 ft    Wheelchair 50 feet with 2 turns activity    Assist    Wheelchair 50 feet with 2 turns activity did not occur: Safety/medical concerns   Assist Level: Dependent - Patient 0%   Wheelchair 150 feet activity     Assist  Wheelchair 150 feet activity did not occur: Safety/medical concerns       Blood pressure (!) 101/50, pulse 68, temperature 98.2 F (36.8 C), temperature source Oral, resp. rate 18, height '6\' 2"'$  (1.88 m), weight 73 kg, SpO2 100 %.  Medical Problem List and Plan: 1.  Functional and mobility deficits secondary to fall with C6/7 fx and EDH and subsequent cervical cord compression at that level. He is a C7 Central cord, left side more affected than right.  Con't PT, OT ;  CIR 2.  Antithrombotics: -DVT/anticoagulation:  Mechanical: Sequential compression devices, below knee Bilateral lower extremities             -antiplatelet therapy: N/A 3. Pain Management: Continue tylenol TID with Oxycodone prn  8/15- no pain this AM-con't regimen prn 4. Mood: LCSW to follow for evaluation and support/              -antipsychotic agents: N/A 5. Neuropsych: This patient is capable of making decisions on his own behalf. 6. Skin/Wound Care: routine pressure relief measures.  7. Fluids/Electrolytes/Nutrition: Monitor I/Os. Check BMET on 08/08 8. HTN:   Controlled on 8/14 9. T2DM: Monitor BS ac/hs. Use SSI for elevated BS  Changed to carb modified diet-  8/10- A1c is 6.4  Metformin started on 8/14  Elevated on 8/14  8/15- increased metformin 250 mg BID from daily- BG's still somewhat elevated 10. PMR with peripheral neuropathy: continue Topamax, Lyrica, 11. Prostate cancer w/bony mets:  Treated with XRT 01/2021 at [facility] --On Xandi with  proscar 12. Wet voice: intermittent. Sometimes hard to clear with collar and positioning  Flutter valve ordered Albuterol nebs 8/15- sounding better- con't regimen 13. H/o depression: ON Effexor.  14. Constipation:   8/15- also component of neurogenic bowel- incontinence when sleeps sometimes- con't off bowel program per pt request 15. Urinary incontinence:  -timed voids  -check PVR's  Improving.  16. Insomnia  8/15- will stop trazodone and start Ambien 5 mg at 8pm daily.  17.  Acute blood loss anemia  Hemoglobin 9.3 on 8/8, labs ordered  for tomorrow  8/15- Hb up to 11.1- con't to monitor  LOS: 10 days A FACE TO FACE EVALUATION WAS PERFORMED  Jared Bridges 07/15/2021, 1:51 PM

## 2021-07-15 NOTE — Telephone Encounter (Signed)
Will address with Dr.Rehman. 

## 2021-07-16 DIAGNOSIS — N39490 Overflow incontinence: Secondary | ICD-10-CM | POA: Diagnosis not present

## 2021-07-16 DIAGNOSIS — D62 Acute posthemorrhagic anemia: Secondary | ICD-10-CM | POA: Diagnosis not present

## 2021-07-16 DIAGNOSIS — E1165 Type 2 diabetes mellitus with hyperglycemia: Secondary | ICD-10-CM | POA: Diagnosis not present

## 2021-07-16 DIAGNOSIS — S14127D Central cord syndrome at C7 level of cervical spinal cord, subsequent encounter: Secondary | ICD-10-CM | POA: Diagnosis not present

## 2021-07-16 LAB — GLUCOSE, CAPILLARY
Glucose-Capillary: 152 mg/dL — ABNORMAL HIGH (ref 70–99)
Glucose-Capillary: 147 mg/dL — ABNORMAL HIGH (ref 70–99)
Glucose-Capillary: 103 mg/dL — ABNORMAL HIGH (ref 70–99)
Glucose-Capillary: 115 mg/dL — ABNORMAL HIGH (ref 70–99)

## 2021-07-16 NOTE — Patient Care Conference (Signed)
Inpatient RehabilitationTeam Conference and Plan of Care Update Date: 07/16/2021   Time: 11:07 AM   Patient Name: Jared Bridges      Medical Record Number: [id number]  Date of Birth: [birth year] Sex: Male         Room/Bed: 4W26C/4W26C-01 Payor Info: Payor: [place] / Plan: [employer] MEDICARE / Product Type: *No Product type* /    Admit Date/Time:  07/05/2021  8:24 PM  Primary Diagnosis:  Central cord syndrome at C7 level of cervical spinal cord [name])  Hospital Problems: Principal Problem:   Central cord syndrome at C7 level of cervical spinal cord ([place]) Active Problems:   Urinary incontinence   Controlled type 2 diabetes mellitus with hyperglycemia, without long-term current use of insulin ([place])   Acute blood loss anemia    Expected Discharge Date: Expected Discharge Date: 07/26/21  Team Members Present: Physician leading conference: Dr. Alger Simons Social Worker Present: [name], [place] Nurse Present: [name], RN PT Present: Excell Seltzer, PT OT Present: Roanna Epley, [place], OT PPS Coordinator present : [name], SLP     Current Status/Progress Goal Weekly Team Focus  Bowel/Bladder   Incontinent of Bowel and Bladder. LBM 07/15/21  Regain Continence of Both.      Swallow/Nutrition/ Hydration             ADL's   bed mobility-mod A; bathing at shower level-max A; UB dressing-mod A; LB dressing-tot A; flexed posture; self feeding-min A  min A overall  activity tolerance, sitting balance, functional transfers, education, BADL retraining   Mobility   mod A bed mobility, +2 transfers with stedy, gait in // bars or with LiteGait +2 for safety  Supervision transfers, CGA gait and stairs (likely will need to downgrade pending progress)  standing, LE strengthening, gait as able   Communication             Safety/Cognition/ Behavioral Observations            Pain   Denies pain.  Will Remain pain free.      Skin   Upper and  Lower extremities skin tear post fall  Promote healing, Prevent any skin breakdown.        Discharge Planning:  Pt to d/c to home with support from [name] and wife.   Team Discussion: Having problems sleeping, discontinue Trazodone, started Ambien. Monitoring CBG's. Incontinent B/B, neck pain and prn's effective. Skin tear LUE, scattered bruising. Fatigues, gait training in lite gait and parallel bars. Slow to progress. Using AE to thread pants, mod assist STS, arms and hands are weak. Slow to progress.   Patient on target to meet rehab goals: yes  *See Care Plan and progress notes for long and short-term goals.   Revisions to Treatment Plan:  Monitor sleeping with medication change.  Teaching Needs: Family education, medication management, pain management, skin/wound care, transfer training, gait training, balance training, endurance training, safety awareness.  Current Barriers to Discharge: Decreased caregiver support, Medical stability, Home enviroment access/layout, Incontinence, Wound care, Lack of/limited family support, and Medication compliance  Possible Resolutions to Barriers: Continue current medications, provide emotional support.     Medical Summary Current Status: C7 central cord. sleep issues at times, ambien helped. cervical collar fitting better  Barriers to Discharge: Medical stability   Possible Resolutions to [employer] Focus: daily assessment of pt data and VS. pain control   Continued Need for Acute Rehabilitation Level of Care: The patient requires daily medical management by a  physician with specialized training in physical medicine and rehabilitation for the following reasons: Direction of a multidisciplinary physical rehabilitation program to maximize functional independence : Yes Medical management of patient stability for increased activity during participation in an intensive rehabilitation regime.: Yes Analysis of laboratory values and/or  radiology reports with any subsequent need for medication adjustment and/or medical intervention. : Yes   I attest that I was present, lead the team conference, and concur with the assessment and plan of the team.   [name] 07/16/2021, 4:03 PM

## 2021-07-16 NOTE — Progress Notes (Signed)
Physical Therapy Weekly Progress Note  Patient Details  Name: Jared Bridges MRN: [id number] Date of Birth: Oct 04, 1930  Beginning of progress report period: July 06, 2021 End of progress report period: July 16, 2021  Today's Date: 07/16/2021  Patient has met 0 of 4 short term goals.  Pt is making very slow progress towards therapy goals. He is currently mod A for bed mobility for management of BLE, +2 to dependent for transfers with use of stedy, and dependent for gait with use of LiteGait or with +2 in // bars. Pt remains limited by ongoing pain in his neck from surgery, frequent headaches, and significant fatigue compounded by his inability to sleep at night. Patient fatigues very quickly during therapy sessions and exhibits flexed hips/trunk/neck in standing position that increases with onset of fatigue.  Patient continues to demonstrate the following deficits muscle weakness and muscle joint tightness, decreased cardiorespiratoy endurance, abnormal tone, unbalanced muscle activation, and decreased coordination, and decreased sitting balance, decreased standing balance, decreased postural control, and decreased balance strategies and therefore will continue to benefit from skilled PT intervention to increase functional independence with mobility.  Patient not progressing toward long term goals.  See goal revision..  Plan of care revisions: Downgraded goals to min A overall for transfers, gait, and stairs with decreased gait distance and decreased stair amount. Per patient report his family can install stair lift so that he can access the 2nd floor of his home.  PT Short Term Goals Week 1:  PT Short Term Goal 1 (Week 1): Pt will perform bed mobility with consistent Min A following cervical/ spinal precautions. PT Short Term Goal 1 - Progress (Week 1): Progressing toward goal PT Short Term Goal 2 (Week 1): Pt will perform STS with Min A using RW. PT Short Term Goal 2 - Progress (Week 1): Not  met PT Short Term Goal 3 (Week 1): Pt will perform SPVT with consistent Min A and reduced pain using RW. PT Short Term Goal 3 - Progress (Week 1): Not met PT Short Term Goal 4 (Week 1): Pt will ambulate at least 20 feet with Min A using RW. PT Short Term Goal 4 - Progress (Week 1): Not met Week 2:  PT Short Term Goal 1 (Week 2): Pt will complete least restrictive transfer with mod A consistently PT Short Term Goal 2 (Week 2): Pt will initiate gait training with RW PT Short Term Goal 3 (Week 2): Pt will initiate w/c mobility PT Short Term Goal 4 (Week 2): Pt will tolerate standing x 5 min during functional task  Therapy Documentation Precautions:  Precautions Precautions: Cervical, Fall Required Braces or Orthoses: Cervical Brace Cervical Brace: Hard collar, At all times Restrictions Weight Bearing Restrictions: No      Therapy/Group: Individual Therapy   Excell Seltzer, PT, DPT, CSRS 07/16/2021, 7:42 AM

## 2021-07-16 NOTE — Progress Notes (Signed)
Patient ID: MARCIA RODAS, male   DOB: October 27, 1930, 87 y.o.   MRN: [id number]  SW made efforts to give pt updates from team conference, but pt sleeping.  SW will make efforts another time.   [name], MSW, [place] Office: [phone] Cell: [phone] Fax: [phone]

## 2021-07-16 NOTE — Progress Notes (Signed)
Physical Therapy Session Note  Patient Details  Name: Jared Bridges MRN: [id number] Date of Birth: 11/01/1930  Today's Date: 07/16/2021 PT Individual Time: 1130-1200; [id number]; [id number] PT Individual Time Calculation (min): 30 min and 15 min and 25 min PT Missed Time: 45 min Missed Time Reason: patient fatigue  Short Term Goals: Week 1:  PT Short Term Goal 1 (Week 1): Pt will perform bed mobility with consistent Min A following cervical/ spinal precautions. PT Short Term Goal 2 (Week 1): Pt will perform STS with Min A using RW. PT Short Term Goal 3 (Week 1): Pt will perform SPVT with consistent Min A and reduced pain using RW. PT Short Term Goal 4 (Week 1): Pt will ambulate at least 20 feet with Min A using RW.  Skilled Therapeutic Interventions/Progress Updates:    Session 1: Pt received seated in TIS chair in room, agreeable to PT session. No complaints of pain, pt reports he does feel a HA coming on but does not have any pain currently. Pt declines to work on any standing or transfers this AM due to fear of falling at this time, agreeable to work on this PM. Session focus on BUE strength at w/c level. Seated BUE strengthening with 1# dowel rod bicep curls 2 x 15 reps, volleyball 2 x 20 reps to fatigue. Pt left seated in TIS chair in room with needs in reach, quick release belt and chair alarm in place at end of session.  Session 2: Pt received seated in bed asleep, arousable but not agreeable to participate in therapy session. Pt reports feeling significantly fatigued and like he has "hit a wall". Pt reports ongoing issues with sleeping at night, medical team attempting to adjust medication to assist with sleeping. Education with patient regarding upcoming d/c date and rehab unit expectations for therapy minutes. Pt understanding of education and receptive but reports he is unable to participate due to fatigue at this time. Pt missed 45 in of scheduled therapy session due to fatigue, will  follow up per POC.  Session 3: Pt received seated in bed with wife and daughter present. Pt reports pain in B shoulders. Supine to sit with mod A for trunk elevation and some LE management. Once seated EOB performed STM and TPR to upper traps. Pt able to perform forwards/backwards shoulder rolls x 15 reps each. Sit to supine with min to mod A needed for BLE management. Provided short-acting hot packs to upper traps for pain management with instructions to remove if they become uncomfortable, pt's daughter aware. Discussed pt's current level of function, expected LOS, and expected functional level upon d/c home with patient's daughter. Pt left seated in bed with needs in reach, bed alarm in place, family present.  Therapy Documentation Precautions:  Precautions Precautions: Cervical, Fall Required Braces or Orthoses: Cervical Brace Cervical Brace: Hard collar, At all times Restrictions Weight Bearing Restrictions: No      Therapy/Group: Individual Therapy   Excell Seltzer, PT, DPT, CSRS  07/16/2021, 12:26 PM

## 2021-07-16 NOTE — Plan of Care (Signed)
  Problem: RH Balance Goal: LTG Patient will maintain dynamic standing balance (PT) Description: LTG:  Patient will maintain dynamic standing balance with assistance during mobility activities (PT) Flowsheets (Taken 07/16/2021 1422) LTG: Pt will maintain dynamic standing balance during mobility activities with:: (downgrade due to slow progress) Minimal Assistance - Patient > 75% Note: downgrade due to slow progress   Problem: Sit to Stand Goal: LTG:  Patient will perform sit to stand with assistance level (PT) Description: LTG:  Patient will perform sit to stand with assistance level (PT) Flowsheets (Taken 07/16/2021 1422) LTG: PT will perform sit to stand in preparation for functional mobility with assistance level: (downgrade due to slow progress) Minimal Assistance - Patient > 75% Note: downgrade due to slow progress   Problem: RH Bed Mobility Goal: LTG Patient will perform bed mobility with assist (PT) Description: LTG: Patient will perform bed mobility with assistance, with/without cues (PT). Flowsheets (Taken 07/16/2021 1422) LTG: Pt will perform bed mobility with assistance level of: (downgrade due to slow progress) Minimal Assistance - Patient > 75% Note: downgrade due to slow progress   Problem: RH Bed to Chair Transfers Goal: LTG Patient will perform bed/chair transfers w/assist (PT) Description: LTG: Patient will perform bed to chair transfers with assistance (PT). Flowsheets (Taken 07/16/2021 1422) LTG: Pt will perform Bed to Chair Transfers with assistance level: (downgrade due to slow progress) Minimal Assistance - Patient > 75% Note: downgrade due to slow progress   Problem: RH Car Transfers Goal: LTG Patient will perform car transfers with assist (PT) Description: LTG: Patient will perform car transfers with assistance (PT). Flowsheets (Taken 07/16/2021 1422) LTG: Pt will perform car transfers with assist:: (downgrade due to slow progress) Minimal Assistance - Patient >  75% Note: downgrade due to slow progress   Problem: RH Furniture Transfers Goal: LTG Patient will perform furniture transfers w/assist (OT/PT) Description: LTG: Patient will perform furniture transfers  with assistance (OT/PT). Flowsheets (Taken 07/16/2021 1422) LTG: Pt will perform furniture transfers with assist:: (downgrade due to slow progress) Minimal Assistance - Patient > 75% Note: downgrade due to slow progress   Problem: RH Ambulation Goal: LTG Patient will ambulate in controlled environment (PT) Description: LTG: Patient will ambulate in a controlled environment, # of feet with assistance (PT). Flowsheets (Taken 07/16/2021 1422) LTG: Pt will ambulate in controlled environ  assist needed:: (downgrade due to slow progress) Minimal Assistance - Patient > 75% LTG: Ambulation distance in controlled environment: 50 ft with LRAD Note: downgrade due to slow progress Goal: LTG Patient will ambulate in home environment (PT) Description: LTG: Patient will ambulate in home environment, # of feet with assistance (PT). Flowsheets (Taken 07/16/2021 1422) LTG: Pt will ambulate in home environ  assist needed:: (downgrade due to slow progress) Minimal Assistance - Patient > 75% LTG: Ambulation distance in home environment: 25 ft with LRAD Note: downgrade due to slow progress   Problem: RH Stairs Goal: LTG Patient will ambulate up and down stairs w/assist (PT) Description: LTG: Patient will ambulate up and down # of stairs with assistance (PT) Flowsheets (Taken 07/16/2021 1422) LTG: Pt will ambulate up/down stairs assist needed:: (downgrade due to slow progress) Minimal Assistance - Patient > 75% LTG: Pt will  ambulate up and down number of stairs: 3 stairs with one handrail to enter home Note: downgrade due to slow progress

## 2021-07-16 NOTE — Progress Notes (Signed)
PROGRESS NOTE   Subjective/Complaints:  Seems to have slept better with ambien last night. Looked alert and refreshed this morning. Didn't even mention sleep to me.   ROS: Patient denies fever, rash, sore throat, blurred vision, nausea, vomiting, diarrhea, cough, shortness of breath or chest pain, joint or back pain, headache, or mood change.    Objective:   No results found. Recent Labs    07/15/21 0711  WBC 8.0  HGB 11.1*  HCT 34.6*  PLT 265    Recent Labs    07/15/21 0711  NA 138  K 3.8  CL 106  CO2 25  GLUCOSE 167*  BUN 23  CREATININE 1.14  CALCIUM 9.1     Intake/Output Summary (Last 24 hours) at 07/16/2021 1359 Last data filed at 07/16/2021 1319 Gross per 24 hour  Intake 600 ml  Output --  Net 600 ml        Physical Exam: Vital Signs Blood pressure (!) 118/57, pulse 71, temperature (!) 97.5 F (36.4 C), temperature source Oral, resp. rate 18, height '6\' 2"'$  (1.88 m), weight 73 kg, SpO2 99 %.    Constitutional: No distress . Vital signs reviewed. HEENT: NCAT, EOMI, oral membranes moist Neck: supple. Aspen collar in place. Tends to lean forward Cardiovascular: RRR without murmur. No JVD    Respiratory/Chest: CTA Bilaterally without wheezes or rales. Normal effort    GI/Abdomen: BS +, non-tender, non-distended Ext: no clubbing, cyanosis, or edema Psych: pleasant and cooperative  Neurological: alert- using built up handles ot eat food.  Alert and oriented x 3. Normal insight and awareness. Intact Memory. Normal language and speech. Cranial nerve exam unremarkable . Motor 4/5 in LE's. RUE 3+ to 4/5. LUE 3-4/5 prox to distal. Skin: Warm and dry.  Intact. Musc: No edema in extremities.  No tenderness in extremities.    Assessment/Plan: 1. Functional deficits which require 3+ hours per day of interdisciplinary therapy in a comprehensive inpatient rehab setting. Physiatrist is providing close team  supervision and 24 hour management of active medical problems listed below. Physiatrist and rehab team continue to assess barriers to discharge/monitor patient progress toward functional and medical goals  Care Tool:  Bathing    Body parts bathed by patient: Face, Abdomen, Chest, Right upper leg, Left upper leg, Left arm   Body parts bathed by helper: Right arm, Front perineal area, Buttocks, Right lower leg, Left lower leg     Bathing assist Assist Level: Maximal Assistance - Patient 24 - 49%     Upper Body Dressing/Undressing Upper body dressing   What is the patient wearing?: Pull over shirt    Upper body assist Assist Level: Maximal Assistance - Patient 25 - 49%    Lower Body Dressing/Undressing Lower body dressing      What is the patient wearing?: Incontinence brief     Lower body assist Assist for lower body dressing: Dependent - Patient 0%     Toileting Toileting Toileting Activity did not occur (Clothing management and hygiene only): N/A (no void or bm)  Toileting assist Assist for toileting: Moderate Assistance - Patient 50 - 74%     Transfers Chair/bed transfer  Transfers assist  Chair/bed transfer assist level: Dependent - mechanical lift     Locomotion Ambulation   Ambulation assist   Ambulation activity did not occur: Safety/medical concerns  Assist level: Total Assistance - Patient < 25% Assistive device: Lite Gait Max distance: 40   Walk 10 feet activity   Assist  Walk 10 feet activity did not occur: Safety/medical concerns  Assist level: Total Assistance - Patient < 25% Assistive device: Lite Gait   Walk 50 feet activity   Assist Walk 50 feet with 2 turns activity did not occur: Safety/medical concerns  Assist level: Dependent - Patient 0% Assistive device: Lite Gait    Walk 150 feet activity   Assist Walk 150 feet activity did not occur: Safety/medical concerns         Walk 10 feet on uneven surface   activity   Assist Walk 10 feet on uneven surfaces activity did not occur: Safety/medical concerns         Wheelchair     Assist Will patient use wheelchair at discharge?: No Type of Wheelchair: Manual    Wheelchair assist level: Dependent - Patient 0% Max wheelchair distance: 10 ft    Wheelchair 50 feet with 2 turns activity    Assist    Wheelchair 50 feet with 2 turns activity did not occur: Safety/medical concerns   Assist Level: Dependent - Patient 0%   Wheelchair 150 feet activity     Assist  Wheelchair 150 feet activity did not occur: Safety/medical concerns       Blood pressure (!) 118/57, pulse 71, temperature (!) 97.5 F (36.4 C), temperature source Oral, resp. rate 18, height '6\' 2"'$  (1.88 m), weight 73 kg, SpO2 99 %.  Medical Problem List and Plan: 1.  Functional and mobility deficits secondary to fall with C6/7 fx and EDH and subsequent cervical cord compression at that level. He is a C7 Central cord, left side more affected than right.  -Continue CIR therapies including PT, OT  2.  Antithrombotics: -DVT/anticoagulation:  Mechanical: Sequential compression devices, below knee Bilateral lower extremities             -antiplatelet therapy: N/A 3. Pain Management: Continue tylenol TID with Oxycodone prn  8/16- no pain this AM-con't regimen prn 4. Mood: LCSW to follow for evaluation and support/              -antipsychotic agents: N/A 5. Neuropsych: This patient is capable of making decisions on his own behalf. 6. Skin/Wound Care: routine pressure relief measures.  7. Fluids/Electrolytes/Nutrition: Monitor I/Os. Check BMET on 08/08 8. HTN:   Controlled on 8/14 9. T2DM: Monitor BS ac/hs. Use SSI for elevated BS  Changed to carb modified diet-  8/10- A1c is 6.4  Metformin started on 8/14  8/15- increased metformin 250 mg BID from daily-   CBG (last 3)  Recent Labs    07/15/21 2056 07/16/21 0618 07/16/21 1133  GLUCAP 111* 152* 147*    8/16  cbg's a little better. Observe for metformin effect 10. PMR with peripheral neuropathy: continue Topamax, Lyrica, 11. Prostate cancer w/bony mets:  Treated with XRT 01/2021 at [facility] --On Xandi with proscar 12. Wet voice: intermittent. Sometimes hard to clear with collar and positioning  Flutter valve ordered Albuterol nebs 8/16- sounds clear 13. H/o depression: ON Effexor.  14. Constipation:   8/15- also component of neurogenic bowel- incontinence when sleeps sometimes- con't off bowel program per pt request 15. Urinary incontinence:  -timed voids  -check PVR's  Improving.  16. Insomnia  8/15- stopped trazodone and started Ambien 5 mg at 8pm daily.   8/16 sleep improved last night 17.  Acute blood loss anemia  Hemoglobin 9.3 on 8/8, labs ordered for tomorrow  8/15- Hb up to 11.1- con't to monitor  LOS: 11 days A FACE TO [place] 07/16/2021, 1:59 PM

## 2021-07-16 NOTE — Progress Notes (Signed)
Occupational Therapy Session Note  Patient Details  Name: JUSTINIAN ROBERG MRN: [id number] Date of Birth: 05-09-1930  Today's Date: 07/16/2021 OT Individual Time: [id number] OT Individual Time Calculation (min): 55 min    Short Term Goals: Week 2:  OT Short Term Goal 1 (Week 2): Pt will complete LB dressing with mod assist, using AE prn. OT Short Term Goal 2 (Week 2): Pt will complete toilet transfer with min assist. OT Short Term Goal 3 (Week 2): Pt will complete toileting with mod A  Skilled Therapeutic Interventions/Progress Updates:    Pt resting in w/c upon arrival and agreeable to changing clothing this morning. OT intervention with focus on dressing with sit<>stand from w/c, BUE FMC and strengthening with theraputty. Pt required max A to doff shirt and mod A to don pullover shirt. Pt used reacher to thread BLE into pants but tot A for pulling over hips while standing with RW. Table activities with theraputty-making into ball and rolling into snake before pinching with thumb and forefinger on B hands. Pt returned to room and remained in w/c with belt alarm activated and all needs within reach.   Therapy Documentation Precautions:  Precautions Precautions: Cervical, Fall Required Braces or Orthoses: Cervical Brace Cervical Brace: Hard collar, At all times Restrictions Weight Bearing Restrictions: No   Pain:  Pt denies pain this morning   Therapy/Group: Individual Therapy  [name] 07/16/2021, 10:04 AM

## 2021-07-17 DIAGNOSIS — G479 Sleep disorder, unspecified: Secondary | ICD-10-CM | POA: Diagnosis not present

## 2021-07-17 DIAGNOSIS — D62 Acute posthemorrhagic anemia: Secondary | ICD-10-CM | POA: Diagnosis not present

## 2021-07-17 DIAGNOSIS — S14127D Central cord syndrome at C7 level of cervical spinal cord, subsequent encounter: Secondary | ICD-10-CM | POA: Diagnosis not present

## 2021-07-17 DIAGNOSIS — E1165 Type 2 diabetes mellitus with hyperglycemia: Secondary | ICD-10-CM | POA: Diagnosis not present

## 2021-07-17 LAB — GLUCOSE, CAPILLARY
Glucose-Capillary: 133 mg/dL — ABNORMAL HIGH (ref 70–99)
Glucose-Capillary: 133 mg/dL — ABNORMAL HIGH (ref 70–99)
Glucose-Capillary: 122 mg/dL — ABNORMAL HIGH (ref 70–99)
Glucose-Capillary: 135 mg/dL — ABNORMAL HIGH (ref 70–99)

## 2021-07-17 NOTE — Progress Notes (Signed)
PROGRESS NOTE   Subjective/Complaints: Patient seen sitting up in bed this morning, working with therapies.  He states he slept very well overnight.  Discussed upper extremity proximal weakness with therapies.  ROS: Denies CP, SOB, N/V/D  Objective:   No results found. Recent Labs    07/15/21 0711  WBC 8.0  HGB 11.1*  HCT 34.6*  PLT 265     Recent Labs    07/15/21 0711  NA 138  K 3.8  CL 106  CO2 25  GLUCOSE 167*  BUN 23  CREATININE 1.14  CALCIUM 9.1      Intake/Output Summary (Last 24 hours) at 07/17/2021 1118 Last data filed at 07/17/2021 0839 Gross per 24 hour  Intake 840 ml  Output --  Net 840 ml         Physical Exam: Vital Signs Blood pressure 120/60, pulse 83, temperature 97.8 F (36.6 C), temperature source Oral, resp. rate 18, height '6\' 2"'$  (1.88 m), weight 73 kg, SpO2 97 %. Constitutional: No distress . Vital signs reviewed. HENT: Normocephalic.  Atraumatic. Eyes: EOMI. No discharge. Cardiovascular: No JVD.  RRR. Respiratory: Normal effort.  No stridor.  Bilateral clear to auscultation. GI: Non-distended.  BS +. Skin: Warm and dry.  Intact. Psych: Normal mood.  Normal behavior. Musc: No edema in extremities.  No tenderness in extremities. Neurological: Alert Motor: Bilateral upper extremities: Shoulder abduction 2+/5, elbow flexion 4 -/5, elbow extension 3 -/5, distally 4/5, left weaker than right   Assessment/Plan: 1. Functional deficits which require 3+ hours per day of interdisciplinary therapy in a comprehensive inpatient rehab setting. Physiatrist is providing close team supervision and 24 hour management of active medical problems listed below. Physiatrist and rehab team continue to assess barriers to discharge/monitor patient progress toward functional and medical goals  Care Tool:  Bathing    Body parts bathed by patient: Face, Abdomen, Chest, Right upper leg, Left upper  leg, Left arm   Body parts bathed by helper: Right arm, Front perineal area, Buttocks, Right lower leg, Left lower leg     Bathing assist Assist Level: Maximal Assistance - Patient 24 - 49%     Upper Body Dressing/Undressing Upper body dressing   What is the patient wearing?: Pull over shirt    Upper body assist Assist Level: Maximal Assistance - Patient 25 - 49%    Lower Body Dressing/Undressing Lower body dressing      What is the patient wearing?: Incontinence brief     Lower body assist Assist for lower body dressing: Dependent - Patient 0%     Toileting Toileting Toileting Activity did not occur (Clothing management and hygiene only): N/A (no void or bm)  Toileting assist Assist for toileting: Moderate Assistance - Patient 50 - 74%     Transfers Chair/bed transfer  Transfers assist     Chair/bed transfer assist level: Dependent - mechanical lift     Locomotion Ambulation   Ambulation assist   Ambulation activity did not occur: Safety/medical concerns  Assist level: Total Assistance - Patient < 25% Assistive device: Lite Gait Max distance: 40   Walk 10 feet activity   Assist  Walk 10 feet activity did not  occur: Safety/medical concerns  Assist level: Total Assistance - Patient < 25% Assistive device: Lite Gait   Walk 50 feet activity   Assist Walk 50 feet with 2 turns activity did not occur: Safety/medical concerns  Assist level: Dependent - Patient 0% Assistive device: Lite Gait    Walk 150 feet activity   Assist Walk 150 feet activity did not occur: Safety/medical concerns         Walk 10 feet on uneven surface  activity   Assist Walk 10 feet on uneven surfaces activity did not occur: Safety/medical concerns         Wheelchair     Assist Will patient use wheelchair at discharge?: No Type of Wheelchair: Manual    Wheelchair assist level: Dependent - Patient 0% Max wheelchair distance: 10 ft    Wheelchair 50 feet  with 2 turns activity    Assist    Wheelchair 50 feet with 2 turns activity did not occur: Safety/medical concerns   Assist Level: Dependent - Patient 0%   Wheelchair 150 feet activity     Assist  Wheelchair 150 feet activity did not occur: Safety/medical concerns       Blood pressure 120/60, pulse 83, temperature 97.8 F (36.6 C), temperature source Oral, resp. rate 18, height '6\' 2"'$  (1.88 m), weight 73 kg, SpO2 97 %.  Medical Problem List and Plan: 1.  Functional and mobility deficits secondary to fall with C6/7 fx and EDH and subsequent cervical cord compression at that level. He is a C7 Central cord, left side more affected than right.  Continue CIR 2.  Antithrombotics: -DVT/anticoagulation:  Mechanical: Sequential compression devices, below knee Bilateral lower extremities             -antiplatelet therapy: N/A 3. Pain Management: Continue tylenol TID with Oxycodone prn  Controlled on 8/17 4. Mood: LCSW to follow for evaluation and support/              -antipsychotic agents: N/A 5. Neuropsych: This patient is capable of making decisions on his own behalf. 6. Skin/Wound Care: routine pressure relief measures.  7. Fluids/Electrolytes/Nutrition: Monitor I/Os. Check BMET on 08/08 8. HTN:   Controlled on 8/17 9. T2DM: Monitor BS ac/hs. Use SSI for elevated BS  Changed to carb modified diet-  8/10- A1c is 6.4  Metformin started on 8/14, increased to 250 mg BID   CBG (last 3)  Recent Labs    07/16/21 1638 07/16/21 2126 07/17/21 0618  GLUCAP 103* 115* 133*    Remains elevated on 8/17, consider further medication adjustments tomorrow if necessary 10. PMR with peripheral neuropathy: continue Topamax, Lyrica, 11. Prostate cancer w/bony mets:  Treated with XRT 01/2021 at [facility] --On Xandi with proscar 12. Wet voice: intermittent. Sometimes hard to clear with collar and positioning  Flutter valve ordered Albuterol nebs Improved 13. H/o depression: ON Effexor.  14.  Constipation:   8/15- also component of neurogenic bowel- incontinence when sleeps sometimes- con't off bowel program per pt request 15. Urinary incontinence:  -timed voids  -check PVR's  Improving.  16.  Sleep disturbance  8/15- stopped trazodone and started Ambien 5 mg at 8pm daily.   Improving 17.  Acute blood loss anemia  Hemoglobin 11.1 on 8/15  Continue monitor  LOS: 12 days A FACE TO FACE EVALUATION WAS PERFORMED  Tate Zagal Lorie Phenix 07/17/2021, 11:18 AM

## 2021-07-17 NOTE — Progress Notes (Signed)
Occupational Therapy Session Note  Patient Details  Name: Jared Bridges MRN: [id number] Date of Birth: Apr 27, 1930  Today's Date: 07/17/2021 OT Individual Time: [id number] OT Individual Time Calculation (min): 60 min  and Today's Date: 07/17/2021 OT Missed Time: 15 Minutes Missed Time Reason: Patient fatigue   Short Term Goals: Week 2:  OT Short Term Goal 1 (Week 2): Pt will complete LB dressing with mod assist, using AE prn. OT Short Term Goal 2 (Week 2): Pt will complete toilet transfer with min assist. OT Short Term Goal 3 (Week 2): Pt will complete toileting with mod A  Skilled Therapeutic Interventions/Progress Updates:    Pt resting in bed upon arrival, having just completed breakfast in bed. RN present to administer medications. OT intervention with focus on bed mobility, sitting balance, sit<>stand, standing balance, functional transfers, toileting, and LB dressing tasks to increase independence with BADLs. Supine>sit EOB with mod A using bed rails. Sitting balance in preparation for stand pivot transfer with supervision (significant trunk/head flexion with posterior pelvic tilt.). Stand pivot transfer to w/c with min A. Pt completed grooming tasks at sink with supervision. LB dressing with max A. Pt able to thread pants without AE but required assistance to pull over hips when standing. Pt unable to maintain upright posture. Increased flexed posture. Pt requested to use BSC. Stand pivot transfer with mod A. Dependent for hygiene and toileting tasks. Pt unable to perform sit<>stand with RW from [facility] and required use of Stedy. Dependent for pulling pants over hips. Pt demo difficulty maintaining upright posture for more then 10 seconds before resting on Stedy paddles. Pt transferred back to w/c with Stedy. Pt remained in TIS w/c with belt alarm activated. All needs within reach. Pt fatigued and missed 15 mins skilled OT services.   Therapy Documentation Precautions:   Precautions Precautions: Cervical, Fall Required Braces or Orthoses: Cervical Brace Cervical Brace: Hard collar, At all times Restrictions Weight Bearing Restrictions: No General: General OT Amount of Missed Time: 15 Minutes Pain:  Pt denies pain this morning   Therapy/Group: Individual Therapy  [name] 07/17/2021, 9:19 AM

## 2021-07-17 NOTE — Progress Notes (Signed)
Physical Therapy Session Note  Patient Details  Name: Jared Bridges MRN: [id number] Date of Birth: [birth year]  Today's Date: 07/17/2021 PT Individual Time: [id number] PT Individual Time Calculation (min): 43 min   Short Term Goals: Week 2:  PT Short Term Goal 1 (Week 2): Pt will complete least restrictive transfer with mod A consistently PT Short Term Goal 2 (Week 2): Pt will initiate gait training with RW PT Short Term Goal 3 (Week 2): Pt will initiate w/c mobility PT Short Term Goal 4 (Week 2): Pt will tolerate standing x 5 min during functional task  Skilled Therapeutic Interventions/Progress Updates:    Patient in tilt in space w/c and reports fatigued after toileting task with OT this morning.  Assisted in w/c to therapy gym.  Patient pivot transfer to mat with mod A +2 for safety.  Seated to don sky lift walking sling.  Patient sit to stand to eva walker with +2 mod A.  Ambulated with eva walker and mod A +2 for safety 2 x length of track turning and returning to mat with cues for R foot step width, posture, R stance stability and forward gaze.  Patient performed seated anterior weight shifts with and without large blue therapy ball with min A.  Sit to stand x 5 with mod A and max cues for anterior weight shift.  Transferred to w/c with mod A.  Assisted to room in w/c and set up to stay in chair, then pt requested back to bed to rest his head neck.  Squat pivot to bed from w/c with mod A.  Sit to supine with mod A for LE's using spinal precautions through sidelying. Adjusted for comfort and left with call bell and needs in reach and bed alarm active.  Therapy Documentation Precautions:  Precautions Precautions: Cervical, Fall Required Braces or Orthoses: Cervical Brace Cervical Brace: Hard collar, At all times Restrictions Weight Bearing Restrictions: No  Pain: Pain Assessment Pain Scale: 0-10 Pain Score: 4  Pain Type: Acute pain Pain Location: Neck Pain Orientation:  Posterior Pain Descriptors / Indicators: Aching Pain Onset: With Activity Pain Intervention(s): Repositioned   Therapy/Group: Individual Therapy  Reginia Naas Magda Kiel, PT 07/17/2021, 8:51 AM

## 2021-07-17 NOTE — Progress Notes (Signed)
Occupational Therapy Session Note  Patient Details  Name: Jared Bridges MRN: [id number] Date of Birth: [birth year]  Today's Date: 07/17/2021 OT Individual Time: 1430-1530 OT Individual Time Calculation (min): 60 min  and Today's Date: 07/17/2021 OT Missed Time: 15 Minutes Missed Time Reason: Patient fatigue   Short Term Goals: Week 1:  OT Short Term Goal 1 (Week 1): Pt will engage in functional activity for 3 minutes without rest to increase activity tolerance required for ADLs. OT Short Term Goal 1 - Progress (Week 1): Met OT Short Term Goal 2 (Week 1): Pt will complete LB dressing with mod assist, using AE prn. OT Short Term Goal 2 - Progress (Week 1): Progressing toward goal OT Short Term Goal 3 (Week 1): Pt will complete toilet transfer with min assist. OT Short Term Goal 3 - Progress (Week 1): Progressing toward goal Week 2:  OT Short Term Goal 1 (Week 2): Pt will complete LB dressing with mod assist, using AE prn. OT Short Term Goal 2 (Week 2): Pt will complete toilet transfer with min assist. OT Short Term Goal 3 (Week 2): Pt will complete toileting with mod A   Skilled Therapeutic Interventions/Progress Updates:    Pt greeted at time of session semireclined in bed resting no pain resting agreeable to OT session. Pt did have pain later in session in neck with transfers, rest breaks PRN and ensured collar in good position. Supine > sit Mod A for trunk, squat pivot bed > wheelchair Mod A to R side. Cues during squat pivot transfers to not fully stand and just "pop" bottom over to chair. Transported room <> gym in [place] and initial focus on BUE ROM and strengthening with SCIFIT level 2 for 6 mins FWD and 3 mins backward with pt stating he liked this exercise, focusing on extension and tricep strengthening. Sqat pivot wheelchair <> mat with Mod A as well, pt stating pain in neck with light touching therapist shoulder during transfer and rest break given for relief. Sit <> stands Mod A  throughout from mat surface and standing 3 trials, approx 30 sec - 1 minute per trial and knee instability noted, unable to briung trunk upright and flex posture throughout despite cues. Once back in chair, pt with some neck pain from transfer, focused on STM in upper trap/rhomboid area. Pt too fatigued to continue, returned to room and squat pivot same as above. Sit > supine Mod A. Alarm on call bell in reach. Missed 15 mins of OT.   Therapy Documentation Precautions:  Precautions Precautions: Cervical, Fall Required Braces or Orthoses: Cervical Brace Cervical Brace: Hard collar, At all times Restrictions Weight Bearing Restrictions: No      Therapy/Group: Individual Therapy  Viona Gilmore 07/17/2021, 3:38 PM

## 2021-07-17 NOTE — Progress Notes (Addendum)
Patient ID: Jared Bridges, male   DOB: 1930-06-16, 87 y.o.   MRN: [id number]  SW met with pt in room to provide updates from team conference, and d/c date 07/26/21. Pt was drifting in/out of sleep. SW informed pt will f/u with his daughter [name].  *SW spoke with pt dtr [name] [phone]) to provide updates from team conference and d/c date. She reported she was able to get some updates from PT therapist the previous day. Reports she is nervous about pt coming home since she is also caring for their mother, and believes his care needs may be too great. SW encouraged her to come in for family edu to make a more informed decision. SW sent her links for Medicare.gov and [employer] to review SNFs in the event. Family edu scheduled 8/22 1pm-3pm.  [name], MSW, [place] Office: [phone] Cell: [phone] Fax: [phone]

## 2021-07-18 DIAGNOSIS — S14127S Central cord syndrome at C7 level of cervical spinal cord, sequela: Secondary | ICD-10-CM | POA: Diagnosis not present

## 2021-07-18 DIAGNOSIS — D62 Acute posthemorrhagic anemia: Secondary | ICD-10-CM | POA: Diagnosis not present

## 2021-07-18 DIAGNOSIS — E1165 Type 2 diabetes mellitus with hyperglycemia: Secondary | ICD-10-CM | POA: Diagnosis not present

## 2021-07-18 LAB — GLUCOSE, CAPILLARY
Glucose-Capillary: 146 mg/dL — ABNORMAL HIGH (ref 70–99)
Glucose-Capillary: 124 mg/dL — ABNORMAL HIGH (ref 70–99)
Glucose-Capillary: 116 mg/dL — ABNORMAL HIGH (ref 70–99)
Glucose-Capillary: 114 mg/dL — ABNORMAL HIGH (ref 70–99)

## 2021-07-18 NOTE — Progress Notes (Signed)
Physical Therapy Session Note  Patient Details  Name: Jared Bridges MRN: [id number] Date of Birth: 06-Mar-1930  Today's Date: 07/18/2021 PT Individual Time: 6834-1962 PT Individual Time Calculation (min): 53 min   Short Term Goals:  Week 2:  PT Short Term Goal 1 (Week 2): Pt will complete least restrictive transfer with mod A consistently PT Short Term Goal 2 (Week 2): Pt will initiate gait training with RW PT Short Term Goal 3 (Week 2): Pt will initiate w/c mobility PT Short Term Goal 4 (Week 2): Pt will tolerate standing x 5 min during functional task  Skilled Therapeutic Interventions/Progress Updates:   Pt received sitting in WC and agreeable to PT. Pt transported to day room in [facility] Sit<>stand with min assist at litegait to attach harness. Pt performed 180deg turn with mod A. Gait training in litegait 2 x 223f with UE support on Eva walker with mod A to facilitate hip extension and improved WB in BLE; +2 to steer lite gait. Sit<>stand to don/doff harness with min assist and UE support on lite gait handles.   Pt returned to room and performed squat pivot transfer to bed with max assist due to BLE fatigue . Sit>supine completed with mod assist for BLE management and left supine in bed with call bell in reach and all needs met.       Therapy Documentation Precautions:  Precautions Precautions: Cervical, Fall Required Braces or Orthoses: Cervical Brace Cervical Brace: Hard collar, At all times Restrictions Weight Bearing Restrictions: No  Pain: denies    Therapy/Group: Individual Therapy  ALorie Phenix8/18/2022, 3:34 PM

## 2021-07-18 NOTE — Progress Notes (Signed)
Occupational Therapy Session Note  Patient Details  Name: Jared Bridges MRN: [id number] Date of Birth: 07/04/1930  Today's Date: 07/18/2021 OT Individual Time: [id number] OT Individual Time Calculation (min): 70 min    Short Term Goals: Week 2:  OT Short Term Goal 1 (Week 2): Pt will complete LB dressing with mod assist, using AE prn. OT Short Term Goal 2 (Week 2): Pt will complete toilet transfer with min assist. OT Short Term Goal 3 (Week 2): Pt will complete toileting with mod A  Skilled Therapeutic Interventions/Progress Updates:    Pt resting in bed upon arrival and agreeable to getting OOB and changing clothing. Supine>sit EOB with max A for pushing up from sidelying. Sitting balance EOB with CGA. Sit>stand from EOB and stand pivot transfer to [facility] with min A. Pt required tot A+2 for hygiene and clothing management. Sit<>stand from [facility] with max A+2 but unable to maintain standing. Sit<>stand from [facility] using Stedy with min A and mod A for maintaining standing posture to facilitate hygiene. Pt completed sit<>stand X 4 with Stedy and transferred to w/c. Grooming at sink with setup. Pt continues to fatigue quickly as noted in assistance required during sit<>stand. Pt remained in w/c with all needs within reach.  Therapy Documentation Precautions:  Precautions Precautions: Cervical, Fall Required Braces or Orthoses: Cervical Brace Cervical Brace: Hard collar, At all times Restrictions Weight Bearing Restrictions: No   Pain: Pain Assessment Pain Scale: 0-10 Pain Score: 0-No pain   Therapy/Group: Individual Therapy  [name] 07/18/2021, 9:30 AM

## 2021-07-18 NOTE — Progress Notes (Signed)
Care Plan Note  Patient Details  Name: Jared Bridges MRN: [id number] Date of Birth: 12/14/1929   Pt's plan of care adjusted to 15/7 after speaking with care team and discussed with MD  as pt currently unable to tolerate current therapy schedule with OT, and PT.    Willeen Cass [facility] 07/18/2021, 11:52 AM

## 2021-07-18 NOTE — Progress Notes (Signed)
PROGRESS NOTE   Subjective/Complaints: Up at sink with OT. No new issues. Pain seems controlled. UE's getting stroner  ROS: Patient denies fever, rash, sore throat, blurred vision, nausea, vomiting, diarrhea, cough, shortness of breath or chest pain,  headache, or mood change.   Objective:   No results found. No results for input(s): WBC, HGB, HCT, PLT in the last 72 hours.  No results for input(s): NA, K, CL, CO2, GLUCOSE, BUN, CREATININE, CALCIUM in the last 72 hours.   Intake/Output Summary (Last 24 hours) at 07/18/2021 1558 Last data filed at 07/18/2021 1300 Gross per 24 hour  Intake 240 ml  Output --  Net 240 ml        Physical Exam: Vital Signs Blood pressure 126/62, pulse 72, temperature 98 F (36.7 C), resp. rate 18, height '6\' 2"'$  (1.88 m), weight 73 kg, SpO2 97 %. Constitutional: No distress . Vital signs reviewed. HEENT: NCAT, EOMI, oral membranes moist Neck: supple Cardiovascular: RRR without murmur. No JVD    Respiratory/Chest: CTA Bilaterally without wheezes or rales. Normal effort    GI/Abdomen: BS +, non-tender, non-distended Ext: no clubbing, cyanosis, or edema Psych: pleasant and cooperative  Skin: intact Musc: No edema in extremities.  No tenderness in extremities. Neurological: Alert Motor: Bilateral upper extremities: Shoulder abduction 2+ to 3/5, elbow flexion 4 -/5, elbow extension 3 /5, distally 4/5, left weaker than right   Assessment/Plan: 1. Functional deficits which require 3+ hours per day of interdisciplinary therapy in a comprehensive inpatient rehab setting. Physiatrist is providing close team supervision and 24 hour management of active medical problems listed below. Physiatrist and rehab team continue to assess barriers to discharge/monitor patient progress toward functional and medical goals  Care Tool:  Bathing    Body parts bathed by patient: Face, Abdomen, Chest, Right upper  leg, Left upper leg, Left arm   Body parts bathed by helper: Right arm, Front perineal area, Buttocks, Right lower leg, Left lower leg     Bathing assist Assist Level: Maximal Assistance - Patient 24 - 49%     Upper Body Dressing/Undressing Upper body dressing   What is the patient wearing?: Pull over shirt    Upper body assist Assist Level: Maximal Assistance - Patient 25 - 49%    Lower Body Dressing/Undressing Lower body dressing      What is the patient wearing?: Incontinence brief, Pants     Lower body assist Assist for lower body dressing: Maximal Assistance - Patient 25 - 49%     Toileting Toileting Toileting Activity did not occur (Clothing management and hygiene only): N/A (no void or bm)  Toileting assist Assist for toileting: Dependent - Patient 0%     Transfers Chair/bed transfer  Transfers assist     Chair/bed transfer assist level: 2 Helpers     Locomotion Ambulation   Ambulation assist   Ambulation activity did not occur: Safety/medical concerns  Assist level: Total Assistance - Patient < 25% Assistive device: Walker-Eva (with maxi sky walking sling) Max distance: 40'   Walk 10 feet activity   Assist  Walk 10 feet activity did not occur: Safety/medical concerns  Assist level: Total Assistance - Patient < 25%  Assistive device: Walker-Eva (maxi sky walking sling)   Walk 50 feet activity   Assist Walk 50 feet with 2 turns activity did not occur: Safety/medical concerns  Assist level: Dependent - Patient 0% Assistive device: Lite Gait    Walk 150 feet activity   Assist Walk 150 feet activity did not occur: Safety/medical concerns         Walk 10 feet on uneven surface  activity   Assist Walk 10 feet on uneven surfaces activity did not occur: Safety/medical concerns         Wheelchair     Assist Will patient use wheelchair at discharge?: No Type of Wheelchair: Manual    Wheelchair assist level: Dependent -  Patient 0% Max wheelchair distance: 10 ft    Wheelchair 50 feet with 2 turns activity    Assist    Wheelchair 50 feet with 2 turns activity did not occur: Safety/medical concerns   Assist Level: Dependent - Patient 0%   Wheelchair 150 feet activity     Assist  Wheelchair 150 feet activity did not occur: Safety/medical concerns       Blood pressure 126/62, pulse 72, temperature 98 F (36.7 C), resp. rate 18, height '6\' 2"'$  (1.88 m), weight 73 kg, SpO2 97 %.  Medical Problem List and Plan: 1.  Functional and mobility deficits secondary to fall with C6/7 fx and EDH and subsequent cervical cord compression at that level. He is a C7 Central cord, left side more affected than right.  -Continue CIR therapies including PT, OT  2.  Antithrombotics: -DVT/anticoagulation:  Mechanical: Sequential compression devices, below knee Bilateral lower extremities             -antiplatelet therapy: N/A 3. Pain Management: Continue tylenol TID with Oxycodone prn  Controlled on 8/18 4. Mood: LCSW to follow for evaluation and support/              -antipsychotic agents: N/A 5. Neuropsych: This patient is capable of making decisions on his own behalf. 6. Skin/Wound Care: routine pressure relief measures.  7. Fluids/Electrolytes/Nutrition: Monitor I/Os. Check BMET on 08/08 8. HTN:   Controlled on 8/18 9. T2DM: Monitor BS ac/hs. Use SSI for elevated BS  Changed to carb modified diet-  8/10- A1c is 6.4  Metformin started on 8/14, increased to 250 mg BID   CBG (last 3)  Recent Labs    07/17/21 2056 07/18/21 0505 07/18/21 1204  GLUCAP 135* 146* 124*   Reasonable control 8/18. No changes 10. PMR with peripheral neuropathy: continue Topamax, Lyrica, 11. Prostate cancer w/bony mets:  Treated with XRT 01/2021 at [facility] --On Xandi with proscar 12. Wet voice: intermittent. Sometimes hard to clear with collar and positioning  Flutter valve ordered Albuterol nebs Much improved 13. H/o depression:  ON Effexor.  14. Constipation:   8/15- also component of neurogenic bowel- incontinence when sleeps sometimes- con't off bowel program per pt request 15. Urinary incontinence:  -timed voids  -check PVR's  Improving.  16.  Sleep disturbance  8/15- stopped trazodone and started Ambien 5 mg at 8pm daily.   Improved sleep patterns 17.  Acute blood loss anemia  Hemoglobin 11.1 on 8/15      LOS: 13 days A FACE TO [place] 07/18/2021, 3:58 PM

## 2021-07-18 NOTE — Progress Notes (Signed)
Occupational Therapy Note  Patient Details  Name: Jared Bridges MRN: [id number] Date of Birth: Apr 30, 1930  Today's Date: 07/18/2021 OT Missed Time: 75 Minutes Missed Time Reason: Patient fatigue  Pt sleeping in bed upon arrival and easily aroused. Pt reported that he was "worn out" from morning therapy session and unable to participate. Will check back later.   Leotis Shames [facility] 07/18/2021, 11:16 AM

## 2021-07-19 DIAGNOSIS — N39490 Overflow incontinence: Secondary | ICD-10-CM | POA: Diagnosis not present

## 2021-07-19 DIAGNOSIS — S14127D Central cord syndrome at C7 level of cervical spinal cord, subsequent encounter: Secondary | ICD-10-CM | POA: Diagnosis not present

## 2021-07-19 DIAGNOSIS — E1165 Type 2 diabetes mellitus with hyperglycemia: Secondary | ICD-10-CM | POA: Diagnosis not present

## 2021-07-19 DIAGNOSIS — D62 Acute posthemorrhagic anemia: Secondary | ICD-10-CM | POA: Diagnosis not present

## 2021-07-19 LAB — GLUCOSE, CAPILLARY
Glucose-Capillary: 113 mg/dL — ABNORMAL HIGH (ref 70–99)
Glucose-Capillary: 121 mg/dL — ABNORMAL HIGH (ref 70–99)
Glucose-Capillary: 117 mg/dL — ABNORMAL HIGH (ref 70–99)
Glucose-Capillary: 120 mg/dL — ABNORMAL HIGH (ref 70–99)

## 2021-07-19 NOTE — Progress Notes (Signed)
Occupational Therapy Weekly Progress Note  Patient Details  Name: Jared Bridges MRN: [id number] Date of Birth: May 25, 1930  Beginning of progress report period: July 12, 2021 End of progress report period: July 20, 2021   Patient has met 1 of 3 short term goals.  Pt progress has been minimal during the past week. Pt requires max A for bathing at shower level, mod A for UB dressing, and max A for LB dressing. Stand pivot tranfsers to [facility] vary from min A to Stedy (when fatigued). Pt dependent for toileting tasks. Pt fatigues quickly and requires multiple rest breaks. Pt changed to 15/7 and family is considering SNF placement.  Patient continues to demonstrate the following deficits: muscle weakness, decreased cardiorespiratoy endurance, unbalanced muscle activation and decreased coordination, and decreased sitting balance, decreased standing balance, decreased postural control, and decreased balance strategies and therefore will continue to benefit from skilled OT intervention to enhance overall performance with BADL.  Patient progressing toward long term goals..  Continue plan of care.  OT Short Term Goals Week 2:  OT Short Term Goal 1 (Week 2): Pt will complete LB dressing with mod assist, using AE prn. OT Short Term Goal 1 - Progress (Week 2): Progressing toward goal OT Short Term Goal 2 (Week 2): Pt will complete toilet transfer with min assist. OT Short Term Goal 2 - Progress (Week 2): Met OT Short Term Goal 3 (Week 2): Pt will complete toileting with mod A OT Short Term Goal 3 - Progress (Week 2): Progressing toward goal Week 3:  OT Short Term Goal 1 (Week 3): STG=LTG 2/2 ELOS (continuing working towards min A overall)      [name] 07/19/2021, 8:02 AM

## 2021-07-19 NOTE — Progress Notes (Signed)
PROGRESS NOTE   Subjective/Complaints: Overall happy with progress. Pleased that he's up walking on light gait. Pain seems controlled  ROS: Patient denies fever, rash, sore throat, blurred vision, nausea, vomiting, diarrhea, cough, shortness of breath or chest pain,   headache, or mood change. .   Objective:   No results found. No results for input(s): WBC, HGB, HCT, PLT in the last 72 hours.  No results for input(s): NA, K, CL, CO2, GLUCOSE, BUN, CREATININE, CALCIUM in the last 72 hours.   Intake/Output Summary (Last 24 hours) at 07/19/2021 1136 Last data filed at 07/19/2021 0700 Gross per 24 hour  Intake 480 ml  Output --  Net 480 ml        Physical Exam: Vital Signs Blood pressure 119/60, pulse 78, temperature 97.9 F (36.6 C), temperature source Oral, resp. rate 18, height '6\' 2"'$  (1.88 m), weight 73 kg, SpO2 96 %. Constitutional: No distress . Vital signs reviewed. HEENT: NCAT, EOMI, oral membranes moist Neck: supple Cardiovascular: RRR without murmur. No JVD    Respiratory/Chest: CTA Bilaterally without wheezes or rales. Normal effort    GI/Abdomen: BS +, non-tender, non-distended Ext: no clubbing, cyanosis, or edema Psych: pleasant and cooperative  Skin: intact Musc: No edema in extremities.  No tenderness in extremities. Neurological: Alert Motor: Bilateral upper extremities: Shoulder abduction  3/5, elbow flexion 4 -/5, elbow extension 3- left 3+ right /5, distally 4/5, left side still generally weaker than right. LE's 3+ to 4/5  Assessment/Plan: 1. Functional deficits which require 3+ hours per day of interdisciplinary therapy in a comprehensive inpatient rehab setting. Physiatrist is providing close team supervision and 24 hour management of active medical problems listed below. Physiatrist and rehab team continue to assess barriers to discharge/monitor patient progress toward functional and medical  goals  Care Tool:  Bathing    Body parts bathed by patient: Face, Abdomen, Chest, Right upper leg, Left upper leg, Left arm   Body parts bathed by helper: Right arm, Front perineal area, Buttocks, Right lower leg, Left lower leg     Bathing assist Assist Level: Maximal Assistance - Patient 24 - 49%     Upper Body Dressing/Undressing Upper body dressing   What is the patient wearing?: Pull over shirt    Upper body assist Assist Level: Moderate Assistance - Patient 50 - 74%    Lower Body Dressing/Undressing Lower body dressing      What is the patient wearing?: Incontinence brief, Pants     Lower body assist Assist for lower body dressing: Maximal Assistance - Patient 25 - 49%     Toileting Toileting Toileting Activity did not occur (Clothing management and hygiene only): N/A (no void or bm)  Toileting assist Assist for toileting: Dependent - Patient 0%     Transfers Chair/bed transfer  Transfers assist     Chair/bed transfer assist level: 2 Helpers     Locomotion Ambulation   Ambulation assist   Ambulation activity did not occur: Safety/medical concerns  Assist level: Total Assistance - Patient < 25% Assistive device: Walker-Eva (with maxi sky walking sling) Max distance: 40'   Walk 10 feet activity   Assist  Walk 10 feet activity  did not occur: Safety/medical concerns  Assist level: Total Assistance - Patient < 25% Assistive device: Walker-Eva (maxi sky walking sling)   Walk 50 feet activity   Assist Walk 50 feet with 2 turns activity did not occur: Safety/medical concerns  Assist level: Dependent - Patient 0% Assistive device: Lite Gait    Walk 150 feet activity   Assist Walk 150 feet activity did not occur: Safety/medical concerns         Walk 10 feet on uneven surface  activity   Assist Walk 10 feet on uneven surfaces activity did not occur: Safety/medical concerns         Wheelchair     Assist Will patient use  wheelchair at discharge?: No Type of Wheelchair: Manual    Wheelchair assist level: Dependent - Patient 0% Max wheelchair distance: 10 ft    Wheelchair 50 feet with 2 turns activity    Assist    Wheelchair 50 feet with 2 turns activity did not occur: Safety/medical concerns   Assist Level: Dependent - Patient 0%   Wheelchair 150 feet activity     Assist  Wheelchair 150 feet activity did not occur: Safety/medical concerns       Blood pressure 119/60, pulse 78, temperature 97.9 F (36.6 C), temperature source Oral, resp. rate 18, height '6\' 2"'$  (1.88 m), weight 73 kg, SpO2 96 %.  Medical Problem List and Plan: 1.  Functional and mobility deficits secondary to fall with C6/7 fx and EDH and subsequent cervical cord compression at that level. He is a C7 Central cord, left side more affected than right.  -Continue CIR therapies including PT, OT . Family might be looking at placement  2.  Antithrombotics: -DVT/anticoagulation:  Mechanical: Sequential compression devices, below knee Bilateral lower extremities             -antiplatelet therapy: N/A 3. Pain Management: Continue tylenol TID with Oxycodone prn  Controlled on 8/19 4. Mood: LCSW to follow for evaluation and support/              -antipsychotic agents: N/A 5. Neuropsych: This patient is capable of making decisions on his own behalf. 6. Skin/Wound Care: routine pressure relief measures.  7. Fluids/Electrolytes/Nutrition: Monitor I/Os. Check BMET on 08/08 8. HTN:   Controlled on 8/19 9. T2DM: Monitor BS ac/hs. Use SSI for elevated BS  Changed to carb modified diet-  8/10- A1c is 6.4  Metformin started on 8/14, increased to 250 mg BID   CBG (last 3)  Recent Labs    07/18/21 1720 07/18/21 2157 07/19/21 0604  GLUCAP 116* 114* 113*   controlled 8/19. No changes 10. PMR with peripheral neuropathy: continue Topamax, Lyrica, 11. Prostate cancer w/bony mets:  Treated with XRT 01/2021 at [facility] --On Xandi with  proscar 12. Wet voice: improved  Flutter valve ordered Albuterol nebs   13. H/o depression: ON Effexor.  14. Constipation:    -component of neurogenic bowel- incontinence when sleeps sometimes- con't off bowel program per pt request--typically continent more than he's not, bm's are regular 15. Urinary incontinence:  -timed voids  -PVR's low  -emptying but typically incontinent  16.  Sleep disturbance  8/15- stopped trazodone and started Ambien 5 mg at 8pm daily.   Improved sleep patterns 17.  Acute blood loss anemia  Hemoglobin 11.1 on 8/15      LOS: 14 days A FACE TO [place] 07/19/2021, 11:36 AM

## 2021-07-19 NOTE — Progress Notes (Signed)
Physical Therapy Session Note  Patient Details  Name: Jared Bridges MRN: [id number] Date of Birth: 12/06/1929  Today's Date: 07/19/2021 PT Individual Time: 1100-1142 PT Individual Time Calculation (min): 42 min   Short Term Goals: Week 2:  PT Short Term Goal 1 (Week 2): Pt will complete least restrictive transfer with mod A consistently PT Short Term Goal 2 (Week 2): Pt will initiate gait training with RW PT Short Term Goal 3 (Week 2): Pt will initiate w/c mobility PT Short Term Goal 4 (Week 2): Pt will tolerate standing x 5 min during functional task   Skilled Therapeutic Interventions/Progress Updates:   Pt received sitting in WC and agreeable to PT  Pt transported to rehab gym in WC. Stand pivot transfer training with RW to and from WC with prolonged rest break between attempts. Min assist to attain standing with RW. Mod fading to max assist on first attempt with transfer to the RW with RLE buckling with last step. Mod assist with second attempt to the L. Pt reports extreme fatigue and requesting to return to bed. Pt transported to room in WC. Stand pivot transfer to bed with max assist and heavy posterior bias. No knee instabiliby noted. Sit> supine with mod assist for BLE management. Pt left in bed with call bell in reach and all needs met.      Therapy Documentation Precautions:  Precautions Precautions: Cervical, Fall Required Braces or Orthoses: Cervical Brace Cervical Brace: Hard collar, At all times Restrictions Weight Bearing Restrictions: No    Vital Signs: Therapy Vitals Temp: 97.9 F (36.6 C) Temp Source: Oral Pulse Rate: 74 Resp: 16 BP: (!) 126/58 Patient Position (if appropriate): Lying Oxygen Therapy SpO2: 95 % O2 Device: Room Air Pain: Denies     Therapy/Group: Individual Therapy   E  07/19/2021, 3:23 PM  

## 2021-07-19 NOTE — Progress Notes (Signed)
Occupational Therapy Session Note  Patient Details  Name: Jared Bridges MRN: [id number] Date of Birth: 05/06/1930  Today's Date: 07/20/2021 OT Individual Time: [id number] OT Individual Time Calculation (min): 56 min   Short Term Goals: Week 2:  OT Short Term Goal 1 (Week 2): Pt will complete LB dressing with mod assist, using AE prn. OT Short Term Goal 1 - Progress (Week 2): Progressing toward goal OT Short Term Goal 2 (Week 2): Pt will complete toilet transfer with min assist. OT Short Term Goal 2 - Progress (Week 2): Met OT Short Term Goal 3 (Week 2): Pt will complete toileting with mod A OT Short Term Goal 3 - Progress (Week 2): Progressing toward goal  Skilled Therapeutic Interventions/Progress Updates:    Pt greeted in bed, asleep, initially refusing therapy due to fatigue and not sleeping well last night. Gently encouraged him to transfer OOB to eat his breakfast or get dressed this AM. Pt politely declining, agreeable to bedlevel exercises focusing on UE NMR/ROM in order to increase functional independence with self care tasks. Pt completed AAROM with B UEs HOB elevated, 10 reps 2 sets each exercise, mod facilitation for the Lt, min facilitation for the Rt. Note that pt exhibited decreased control with elbow extension Lt>Rt, needed assistance for stabilizing forearms due to tendency for limb to swing back and almost hit body/c-collar. We also worked on trunk control for functional transfer training. Pt able to grip lower bedrails with cues and minimally elevate his trunk off of the bed, pt able to participate for ~5 reps before fatiguing and needing rest. He remained in bed at close of session, all needs within reach and bed alarm set.   Therapy Documentation Precautions:  Precautions Precautions: Cervical, Fall Required Braces or Orthoses: Cervical Brace Cervical Brace: Hard collar, At all times Restrictions Weight Bearing Restrictions: No  Pain: no c/o pain during tx Pain  Assessment Pain Scale: 0-10 Pain Score: 0-No pain Faces Pain Scale: No hurt ADL:        Therapy/Group: Individual Therapy  [name] 07/20/2021, 12:46 PM

## 2021-07-19 NOTE — Progress Notes (Signed)
Occupational Therapy Session Note  Patient Details  Name: Jared Bridges MRN: [id number] Date of Birth: 01-May-1930  Today's Date: 07/19/2021 OT Individual Time: [id number] OT Individual Time Calculation (min): 70 min    Short Term Goals: Week 3:  OT Short Term Goal 1 (Week 3): STG=LTG 2/2 ELOS (continuing working towards min A overall)  Skilled Therapeutic Interventions/Progress Updates:    Pt resting in bed upon arrival with RN present. OT intervention with focus on bed mobility, sit<>stand, standing balance, functional transfers, [facility] and strengthening, and activity tolerance to increase independence with BADLs. Supine>sit EOB with min A and HOB elevated. Sit<>stand from EOB with min A. Initial stand pivot tranfser with min A but BLE started buckling and required max A to complete transfer and sit in w/c. Pt washed face seated in w/c at sink. UB dressing with mod A. Pt able to thread BUE into sleeves but required assistance placing over head. Pt able to partially pull over trunk. Pt transitioned to day room for [facility] strengthening activities with theraputty-rolling into ball, rolling into snake, flatening, removing/replacing small beads in yellow theraputty. Pt returned to room. Pt commented that he was tired but agreed to remaining in w/c. Belt alarm activated. All needs within reach.   Therapy Documentation Precautions:  Precautions Precautions: Cervical, Fall Required Braces or Orthoses: Cervical Brace Cervical Brace: Hard collar, At all times Restrictions Weight Bearing Restrictions: No   Pain: Pain Assessment Pain Scale: 0-10 Pain Score: 0-No pain  Therapy/Group: Individual Therapy  [name] 07/19/2021, 9:26 AM

## 2021-07-20 DIAGNOSIS — S14127D Central cord syndrome at C7 level of cervical spinal cord, subsequent encounter: Secondary | ICD-10-CM | POA: Diagnosis not present

## 2021-07-20 LAB — GLUCOSE, CAPILLARY
Glucose-Capillary: 115 mg/dL — ABNORMAL HIGH (ref 70–99)
Glucose-Capillary: 141 mg/dL — ABNORMAL HIGH (ref 70–99)
Glucose-Capillary: 108 mg/dL — ABNORMAL HIGH (ref 70–99)
Glucose-Capillary: 145 mg/dL — ABNORMAL HIGH (ref 70–99)

## 2021-07-20 NOTE — Progress Notes (Signed)
Occupational Therapy Session Note  Patient Details  Name: JIONNI SANDERS MRN: [id number] Date of Birth: 03-13-1930  Today's Date: 07/21/2021 OT Individual Time: [id number] OT Individual Time Calculation (min): 10 min  50 minutes missed  Short Term Goals: Week 3:  OT Short Term Goal 1 (Week 3): STG=LTG 2/2 ELOS (continuing working towards min A overall)  Skilled Therapeutic Interventions/Progress Updates:    Pt greeted in bed, reporting that he felt very tired from therapies yesterday, stating "it's not my day today." Pt declining therapy participation. OT gently encouraged him to engage in any self care, OOB, or bedlevel activity however pt still declined. Wanted to rest, c/o coldness in his hands. OT provided him with a warm blanket and also lavender aromatherapy to help him rest. Pt appreciative. He denied having pain, nausea, and malaise. Tx time missed due to pt fatigue/refusal.   Therapy Documentation Precautions:  Precautions Precautions: Cervical, Fall Required Braces or Orthoses: Cervical Brace Cervical Brace: Hard collar, At all times Restrictions Weight Bearing Restrictions: No  Pain: Pain Assessment Pain Scale: 0-10 Pain Score: 0-No pain Faces Pain Scale: No hurt ADL:      Therapy/Group: Individual Therapy  Brookes Craine A Deshanda Molitor 07/21/2021, 12:29 PM

## 2021-07-20 NOTE — Progress Notes (Signed)
Physical Therapy Session Note  Patient Details  Name: KYVAN WEST MRN: [id number] Date of Birth: 1930/09/16  Today's Date: 07/20/2021 PT Individual Time: [id number] and [id number] PT Individual Time Calculation (min): 60 min and 50 min   Short Term Goals: Week 2:  PT Short Term Goal 1 (Week 2): Pt will complete least restrictive transfer with mod A consistently PT Short Term Goal 2 (Week 2): Pt will initiate gait training with RW PT Short Term Goal 3 (Week 2): Pt will initiate w/c mobility PT Short Term Goal 4 (Week 2): Pt will tolerate standing x 5 min during functional task  Skilled Therapeutic Interventions/Progress Updates:     Session 1: Patient in bed upon PT arrival. Patient alert and agreeable to PT session. Patient denied pain during session, reports numbness in his hands, states that this has been ongoing.  Aspen C-collar donned throughout session. Adjusted with total A at beginning of session to improve fit with collar under his chin.   Therapeutic Activity: Bed Mobility: Patient unable to tell if he had been incontinent of bowl or bladder, performed rolling L with CGA to allow PT to assess brief, patient had not been incontinent at this time. Donned pants bed level with total A for energy/time management, performed bridging x2 to allow therapist to pull pants over his hips. He performed supine to sit with min A and increased time in a flat bed without use of bed rails. Provided verbal cues for log roll technique, setting bottom elbow and pushing up to his elbow then his hand to come to sitting. Transfers: Patient performed squat pivot bed>w/c and w/c>TIS w/c with min A and PT blocking R knee. Patient able to sustain squat while therapist transferred Roho cushion between manual and TIS w/c on second trasfer. Provided cues for use of arm rests and head-hips relationship. Patient performed sit to/from stand x3 with min A then CGA in the // bars. Provided verbal cues for scooting  forward, forward weight shift, and gluteal activation to boost to standing.  Gait Training:  Patient ambulated 10 feet forwards and backwards x3 in // bars with min A on first two trials with non-skid socks, and CGA on thirst trial with tennis shoes and R Walk-on AFO and L PLS AFO donned. Ambulated with significant forward flexed posture, variable foot placement, R knee flexion instability in stance improved with Walk-on AFO, and L genu recurvatum improved, but still present, with PLS AFO. Provided verbal cues for erect posture, gluteal activation for increased hip extension and lateral hip stability R>L. Doffed/donned socks, shoes, and AFOs with total A between gait trials. Educated patient about purpose of AFOs, donning/doffing technique, and skin integrity checks after use. AFOs and shoes removed after gait trial without any redness or signs of skin breakdown.  Wheelchair Mobility:  Patient was transported in the w/c with total A throughout session for energy conservation and time management.  Patient in [place] w/c at end of session with breaks locked, seat belt alarm set, and all needs within reach.   Session 2: Patient in TIS w/c with Aspen C-collar donned upon PT arrival. Patient alert and agreeable to PT session. Patient denied pain during session. Patient excited for his daughter and his wife to come at end of session.   Donned B socks, tennis shoes, and AFOs (see above) with total A at beginning of session for gait training.   Therapeutic Activity: Transfers: Patient performed sit to/from stand x2 using RW with mod-max A x1 and +2  assist on second with strong R posterior bias, decreased forward weight shift, and incomplete stand due to crouched posture. He then performed sit to/from stand in the // bars with CGA, as above.  Gait Training:  Patient ambulated 10 feet in the // bars x2 with CGA, as above with AFOs donned and L heel wedge added for improved control of genu recurvatum. Patient  able to stand in // bars without upper extremity support x10 sec following cues for B gluteal activation, erect posture, and locating a visual focal point.   Wheelchair Mobility:  Patient was transported in the [place] w/c with total A throughout session for energy conservation and time management.  Patient's wife and daughter arrived at end of session. Discussed patient's progress, home set-up, and initiated discussion of DME needs including power w/c versus manual, stair lift, ramp (temporary or permanent), and possibly a RW. Reports patient has RW and is renting a manual w/c for ankle fracture from [facility] Report that they have a contractor for the back deck and could put in a ramp as part of the deck rebuild and have started to look into a chair lift. Family education scheduled for Monday, encouraged family to discuss further with therapy team and CSW at this time.   Patient in [place] w/c with his wife and daughter in the room at end of session with breaks locked, seat belt alarm set, and all needs within reach.   Therapy Documentation Precautions:  Precautions Precautions: Cervical, Fall Required Braces or Orthoses: Cervical Brace Cervical Brace: Hard collar, At all times Restrictions Weight Bearing Restrictions: No   Therapy/Group: Individual Therapy  Affan Callow L Jaylon Grode PT, DPT  07/20/2021, 4:14 PM

## 2021-07-20 NOTE — Progress Notes (Signed)
PROGRESS NOTE   Subjective/Complaints: No new issues overnight  ROS: Patient denies CP, SOB, N/V/D   Objective:   No results found. No results for input(s): WBC, HGB, HCT, PLT in the last 72 hours.  No results for input(s): NA, K, CL, CO2, GLUCOSE, BUN, CREATININE, CALCIUM in the last 72 hours.   Intake/Output Summary (Last 24 hours) at 07/20/2021 1203 Last data filed at 07/20/2021 1011 Gross per 24 hour  Intake 420 ml  Output --  Net 420 ml         Physical Exam: Vital Signs Blood pressure (!) 138/55, pulse 78, temperature (!) 97.5 F (36.4 C), temperature source Oral, resp. rate 18, height '6\' 2"'$  (1.88 m), weight 73 kg, SpO2 98 %.  General: No acute distress Mood and affect are appropriate Heart: Regular rate and rhythm no rubs murmurs or extra sounds Lungs: Clear to auscultation, breathing unlabored, no rales or wheezes Abdomen: Positive bowel sounds, soft nontender to palpation, nondistended Extremities: No clubbing, cyanosis, or edema Skin: No evidence of breakdown, no evidence of rash Skin: intact Musc: No edema in extremities.  No tenderness in extremities. Neurological: Alert Motor: Bilateral upper extremities: Shoulder abduction  3/5, elbow flexion 4 -/5, elbow extension 3- left 3+ right /5, distally 4/5, left side still generally weaker than right. LE's 3+ to 4/5  Assessment/Plan: 1. Functional deficits which require 3+ hours per day of interdisciplinary therapy in a comprehensive inpatient rehab setting. Physiatrist is providing close team supervision and 24 hour management of active medical problems listed below. Physiatrist and rehab team continue to assess barriers to discharge/monitor patient progress toward functional and medical goals  Care Tool:  Bathing    Body parts bathed by patient: Face, Abdomen, Chest, Right upper leg, Left upper leg, Left arm   Body parts bathed by helper: Right arm,  Front perineal area, Buttocks, Right lower leg, Left lower leg     Bathing assist Assist Level: Maximal Assistance - Patient 24 - 49%     Upper Body Dressing/Undressing Upper body dressing   What is the patient wearing?: Pull over shirt    Upper body assist Assist Level: Moderate Assistance - Patient 50 - 74%    Lower Body Dressing/Undressing Lower body dressing      What is the patient wearing?: Incontinence brief, Pants     Lower body assist Assist for lower body dressing: Maximal Assistance - Patient 25 - 49%     Toileting Toileting Toileting Activity did not occur (Clothing management and hygiene only): N/A (no void or bm)  Toileting assist Assist for toileting: Dependent - Patient 0%     Transfers Chair/bed transfer  Transfers assist     Chair/bed transfer assist level: 2 Helpers     Locomotion Ambulation   Ambulation assist   Ambulation activity did not occur: Safety/medical concerns  Assist level: Total Assistance - Patient < 25% Assistive device: Walker-Eva (with maxi sky walking sling) Max distance: 40'   Walk 10 feet activity   Assist  Walk 10 feet activity did not occur: Safety/medical concerns  Assist level: Total Assistance - Patient < 25% Assistive device: Walker-Eva (maxi sky walking sling)   Walk 50  feet activity   Assist Walk 50 feet with 2 turns activity did not occur: Safety/medical concerns  Assist level: Dependent - Patient 0% Assistive device: Lite Gait    Walk 150 feet activity   Assist Walk 150 feet activity did not occur: Safety/medical concerns         Walk 10 feet on uneven surface  activity   Assist Walk 10 feet on uneven surfaces activity did not occur: Safety/medical concerns         Wheelchair     Assist Will patient use wheelchair at discharge?: No Type of Wheelchair: Manual    Wheelchair assist level: Dependent - Patient 0% Max wheelchair distance: 10 ft    Wheelchair 50 feet with 2  turns activity    Assist    Wheelchair 50 feet with 2 turns activity did not occur: Safety/medical concerns   Assist Level: Dependent - Patient 0%   Wheelchair 150 feet activity     Assist  Wheelchair 150 feet activity did not occur: Safety/medical concerns       Blood pressure (!) 138/55, pulse 78, temperature (!) 97.5 F (36.4 C), temperature source Oral, resp. rate 18, height '6\' 2"'$  (1.88 m), weight 73 kg, SpO2 98 %.  Medical Problem List and Plan: 1.  Functional and mobility deficits secondary to fall with C6/7 fx and EDH and subsequent cervical cord compression at that level. He is a C7 Central cord, left side more affected than right.  -Continue CIR therapies including PT, OT . Family might be looking at placement  2.  Antithrombotics: -DVT/anticoagulation:  Mechanical: Sequential compression devices, below knee Bilateral lower extremities             -antiplatelet therapy: N/A 3. Pain Management: Continue tylenol TID with Oxycodone prn  Controlled on 8/19 4. Mood: LCSW to follow for evaluation and support/              -antipsychotic agents: N/A 5. Neuropsych: This patient is capable of making decisions on his own behalf. 6. Skin/Wound Care: routine pressure relief measures.  7. Fluids/Electrolytes/Nutrition: Monitor I/Os. Check BMET on 08/08 8. HTN:   Controlled on 8/19 9. T2DM: Monitor BS ac/hs. Use SSI for elevated BS  Changed to carb modified diet-  8/10- A1c is 6.4  Metformin started on 8/14, increased to 250 mg BID   CBG (last 3)  Recent Labs    07/19/21 2112 07/20/21 0645 07/20/21 1114  GLUCAP 120* 115* 141*    controlled 8/19. No changes 10. PMR with peripheral neuropathy: continue Topamax, Lyrica, 11. Prostate cancer w/bony mets:  Treated with XRT 01/2021 at [facility] --On Xandi with proscar 12. Wet voice: improved  Flutter valve ordered Albuterol nebs   13. H/o depression: ON Effexor.  14. Constipation:    -component of neurogenic bowel-  incontinence when sleeps sometimes- con't off bowel program per pt request--typically continent more than he's not, bm's are regular 15. Urinary incontinence:  -timed voids  -PVR's low  -emptying but typically incontinent  16.  Sleep disturbance  8/15- stopped trazodone and started Ambien 5 mg at 8pm daily.   Improved sleep patterns 17.  Acute blood loss anemia  Hemoglobin 11.1 on 8/15      LOS: 15 days A FACE TO Catarina E Zygmund Passero 07/20/2021, 12:03 PM

## 2021-07-21 DIAGNOSIS — S14127D Central cord syndrome at C7 level of cervical spinal cord, subsequent encounter: Secondary | ICD-10-CM | POA: Diagnosis not present

## 2021-07-21 LAB — GLUCOSE, CAPILLARY
Glucose-Capillary: 126 mg/dL — ABNORMAL HIGH (ref 70–99)
Glucose-Capillary: 105 mg/dL — ABNORMAL HIGH (ref 70–99)
Glucose-Capillary: 108 mg/dL — ABNORMAL HIGH (ref 70–99)
Glucose-Capillary: 90 mg/dL (ref 70–99)

## 2021-07-21 NOTE — Progress Notes (Signed)
PROGRESS NOTE   Subjective/Complaints: Pt would like collar off when in bed, we discussed need.  Pt would like to see if fit could be improved   ROS: Patient denies CP, SOB, N/V/D   Objective:   No results found. No results for input(s): WBC, HGB, HCT, PLT in the last 72 hours.  No results for input(s): NA, K, CL, CO2, GLUCOSE, BUN, CREATININE, CALCIUM in the last 72 hours.   Intake/Output Summary (Last 24 hours) at 07/21/2021 1016 Last data filed at 07/21/2021 0837 Gross per 24 hour  Intake 960 ml  Output --  Net 960 ml         Physical Exam: Vital Signs Blood pressure (!) 115/55, pulse 76, temperature 97.6 F (36.4 C), temperature source Oral, resp. rate 16, height '6\' 2"'$  (1.88 m), weight 73 kg, SpO2 97 %.   General: No acute distress Mood and affect are appropriate Heart: Regular rate and rhythm no rubs murmurs or extra sounds Lungs: Clear to auscultation, breathing unlabored, no rales or wheezes Abdomen: Positive bowel sounds, soft nontender to palpation, nondistended Extremities: No clubbing, cyanosis, or edema Skin: No evidence of breakdown, no evidence of rash   Musc: No edema in extremities.  No tenderness in extremities. Neurological: Alert Motor: Bilateral upper extremities: Shoulder abduction  3/5, elbow flexion 4 -/5, elbow extension 3- left 3+ right /5, distally 4/5, left side still generally weaker than right. LE's 3+ to 4/5  Assessment/Plan: 1. Functional deficits which require 3+ hours per day of interdisciplinary therapy in a comprehensive inpatient rehab setting. Physiatrist is providing close team supervision and 24 hour management of active medical problems listed below. Physiatrist and rehab team continue to assess barriers to discharge/monitor patient progress toward functional and medical goals  Care Tool:  Bathing    Body parts bathed by patient: Face, Abdomen, Chest, Right upper leg,  Left upper leg, Left arm   Body parts bathed by helper: Right arm, Front perineal area, Buttocks, Right lower leg, Left lower leg     Bathing assist Assist Level: Maximal Assistance - Patient 24 - 49%     Upper Body Dressing/Undressing Upper body dressing   What is the patient wearing?: Pull over shirt    Upper body assist Assist Level: Moderate Assistance - Patient 50 - 74%    Lower Body Dressing/Undressing Lower body dressing      What is the patient wearing?: Incontinence brief, Pants     Lower body assist Assist for lower body dressing: Maximal Assistance - Patient 25 - 49%     Toileting Toileting Toileting Activity did not occur (Clothing management and hygiene only): N/A (no void or bm)  Toileting assist Assist for toileting: Dependent - Patient 0%     Transfers Chair/bed transfer  Transfers assist     Chair/bed transfer assist level: Minimal Assistance - Patient > 75%     Locomotion Ambulation   Ambulation assist   Ambulation activity did not occur: Safety/medical concerns  Assist level: Contact Guard/Touching assist Assistive device: Parallel bars (with B AFOs) Max distance: 10 ft   Walk 10 feet activity   Assist  Walk 10 feet activity did not occur: Safety/medical concerns  Assist level: Contact Guard/Touching assist Assistive device: Parallel bars (with AFOs)   Walk 50 feet activity   Assist Walk 50 feet with 2 turns activity did not occur: Safety/medical concerns  Assist level: Dependent - Patient 0% Assistive device: Lite Gait    Walk 150 feet activity   Assist Walk 150 feet activity did not occur: Safety/medical concerns         Walk 10 feet on uneven surface  activity   Assist Walk 10 feet on uneven surfaces activity did not occur: Safety/medical concerns         Wheelchair     Assist Will patient use wheelchair at discharge?: No Type of Wheelchair: Manual    Wheelchair assist level: Dependent - Patient  0% Max wheelchair distance: 10 ft    Wheelchair 50 feet with 2 turns activity    Assist    Wheelchair 50 feet with 2 turns activity did not occur: Safety/medical concerns   Assist Level: Dependent - Patient 0%   Wheelchair 150 feet activity     Assist  Wheelchair 150 feet activity did not occur: Safety/medical concerns       Blood pressure (!) 115/55, pulse 76, temperature 97.6 F (36.4 C), temperature source Oral, resp. rate 16, height '6\' 2"'$  (1.88 m), weight 73 kg, SpO2 97 %.  Medical Problem List and Plan: 1.  Functional and mobility deficits secondary to fall with C6/7 fx and EDH and subsequent cervical cord compression at that level. He is a C7 Central cord, left side more affected than right.  -Continue CIR therapies including PT, OT . Family might be looking at placement  2.  Antithrombotics: -DVT/anticoagulation:  Mechanical: Sequential compression devices, below knee Bilateral lower extremities             -antiplatelet therapy: N/A 3. Pain Management: Continue tylenol TID with Oxycodone prn  Controlled on 8/19 4. Mood: LCSW to follow for evaluation and support/              -antipsychotic agents: N/A 5. Neuropsych: This patient is capable of making decisions on his own behalf. 6. Skin/Wound Care: routine pressure relief measures.  7. Fluids/Electrolytes/Nutrition: Monitor I/Os. Check BMET on 08/08 8. HTN:   Controlled on 8/19 9. T2DM: Monitor BS ac/hs. Use SSI for elevated BS  Changed to carb modified diet-  8/10- A1c is 6.4  Metformin started on 8/14, increased to 250 mg BID   CBG (last 3)  Recent Labs    07/20/21 1617 07/20/21 2106 07/21/21 0625  GLUCAP 108* 145* 126*    controlled 8/20 10. PMR with peripheral neuropathy: continue Topamax, Lyrica, 11. Prostate cancer w/bony mets:  Treated with XRT 01/2021 at [facility] --On Xandi with proscar 12. Wet voice: improved  Flutter valve ordered Albuterol nebs   13. H/o depression: ON Effexor.  14.  Constipation:    -component of neurogenic bowel- incontinence when sleeps sometimes- con't off bowel program per pt request--typically continent more than he's not, bm's are regular 15. Urinary incontinence:  -timed voids  -PVR's low  -emptying but typically incontinent  16.  Sleep disturbance  8/15- stopped trazodone and started Ambien 5 mg at 8pm daily.   Improved sleep patterns 17.  Acute blood loss anemia  Hemoglobin 11.1 on 8/15      LOS: 16 days A FACE TO Harbine E Ajdin Macke 07/21/2021, 10:16 AM

## 2021-07-21 NOTE — Progress Notes (Signed)
Physical Therapy Session Note  Patient Details  Name: Jared Bridges MRN: [id number] Date of Birth: [birth year]  Today's Date: 07/21/2021 PT Individual Time: 1301-1357 PT Individual Time Calculation (min): 56 min   Short Term Goals: Week 2:  PT Short Term Goal 1 (Week 2): Pt will complete least restrictive transfer with mod A consistently PT Short Term Goal 2 (Week 2): Pt will initiate gait training with RW PT Short Term Goal 3 (Week 2): Pt will initiate w/c mobility PT Short Term Goal 4 (Week 2): Pt will tolerate standing x 5 min during functional task  Skilled Therapeutic Interventions/Progress Updates:     Pt received seated in TIS WC and agrees to therapy. No complaint of pain. WC transport to gym for time management. Pt initially attempts to ambulates with RW. Mod/maxA for sit to stand and pt remains very forward flexed with posterior bias, sitting back down prior to being able to take step. Pt states that he does not feel like he is ready for a RW a this time due to feeling unsteady and like he might tip the RW over. Pt then completes 2x8' in parallel bars with minA for sit to stand, minA at hips for stability, and +2 WC follow for safety. Following seated rest break, PT demos use of EVA walker and pt agreeable to trial. ModA/maxA for sit to stand to EVA RW, with pt preferring to have both arms on platform of RW prior to attempting transfer, despite PT education on using armests of WC for improved body mechanics. Pt ambulates x40, x130' and x100' with EVA walker and modA with +2 WC following. Pt requires several instances of maxA when sequencing of steps is not optimal and pt begins to retropulse with very flexed posture. Pt ambulates with very kyphotic posture and is unable to correct despite cues from PT. Pt also tends to crossover midline with R leg during swing phase, likely associated with weak hip abductors, but is able to make partial correction with consistent PT cueing during  ambulation. WC transport back to room. Left seated with alarm intact and all needs within reach.  Therapy Documentation Precautions:  Precautions Precautions: Cervical, Fall Required Braces or Orthoses: Cervical Brace Cervical Brace: Hard collar, At all times Restrictions Weight Bearing Restrictions: No    Therapy/Group: Individual Therapy  [name], PT, DPT 07/21/2021, 2:30 PM

## 2021-07-21 NOTE — Progress Notes (Signed)
Physical Therapy Session Note  Patient Details  Name: Jared Bridges MRN: [id number] Date of Birth: 07-Aug-1930  Today's Date: 07/21/2021 PT Missed Time: 76 Minutes Missed Time Reason: Patient fatigue  Short Term Goals: Week 2:  PT Short Term Goal 1 (Week 2): Pt will complete least restrictive transfer with mod A consistently PT Short Term Goal 2 (Week 2): Pt will initiate gait training with RW PT Short Term Goal 3 (Week 2): Pt will initiate w/c mobility PT Short Term Goal 4 (Week 2): Pt will tolerate standing x 5 min during functional task  Skilled Therapeutic Interventions/Progress Updates:     Pt requesting to rest at this time, saying he's had a long week. PT will follow up as able.  Therapy Documentation Precautions:  Precautions Precautions: Cervical, Fall Required Braces or Orthoses: Cervical Brace Cervical Brace: Hard collar, At all times Restrictions Weight Bearing Restrictions: No General: PT Amount of Missed Time (min): 30 Minutes PT Missed Treatment Reason: Patient fatigue    Therapy/Group: Individual Therapy  [name], PT, DPT 07/21/2021, 3:40 PM

## 2021-07-22 ENCOUNTER — Ambulatory Visit (INDEPENDENT_AMBULATORY_CARE_PROVIDER_SITE_OTHER): Payer: Medicare Other | Admitting: Internal Medicine

## 2021-07-22 LAB — BASIC METABOLIC PANEL
Anion gap: 5 (ref 5–15)
BUN: 21 mg/dL (ref 8–23)
CO2: 24 mmol/L (ref 22–32)
Calcium: 8.2 mg/dL — ABNORMAL LOW (ref 8.9–10.3)
Chloride: 109 mmol/L (ref 98–111)
Creatinine, Ser: 0.99 mg/dL (ref 0.61–1.24)
GFR, Estimated: >60 mL/min (ref >60)
Glucose, Bld: 112 mg/dL — ABNORMAL HIGH (ref 70–99)
Potassium: 3.4 mmol/L — ABNORMAL LOW (ref 3.5–5.1)
Sodium: 138 mmol/L (ref 135–145)

## 2021-07-22 LAB — CBC
HCT: 34 % — ABNORMAL LOW (ref 39.0–52.0)
Hemoglobin: 10.8 g/dL — ABNORMAL LOW (ref 13.0–17.0)
MCH: 31.2 pg (ref 26.0–34.0)
MCHC: 31.8 g/dL (ref 30.0–36.0)
MCV: 98.3 fL (ref 80.0–100.0)
Platelets: 230 10*3/uL (ref 150–400)
RBC: 3.46 MIL/uL — ABNORMAL LOW (ref 4.22–5.81)
RDW: 14.4 % (ref 11.5–15.5)
WBC: 5.7 10*3/uL (ref 4.0–10.5)
nRBC: 0 % (ref 0.0–0.2)

## 2021-07-22 LAB — GLUCOSE, CAPILLARY
Glucose-Capillary: 114 mg/dL — ABNORMAL HIGH (ref 70–99)
Glucose-Capillary: 125 mg/dL — ABNORMAL HIGH (ref 70–99)
Glucose-Capillary: 132 mg/dL — ABNORMAL HIGH (ref 70–99)
Glucose-Capillary: 147 mg/dL — ABNORMAL HIGH (ref 70–99)

## 2021-07-22 NOTE — Progress Notes (Signed)
Physical Therapy Session Note  Patient Details  Name: Jared Bridges MRN: [id number] Date of Birth: 05/02/1930  Today's Date: 07/22/2021 PT Individual Time: [id number] PT Individual Time Calculation (min): 60 min   Short Term Goals: Week 3:  PT Short Term Goal 1 (Week 2): Pt will complete least restrictive transfer with mod A consistently PT Short Term Goal 2 (Week 2): Pt will initiate gait training with RW PT Short Term Goal 3 (Week 2): Pt will initiate w/c mobility PT Short Term Goal 4 (Week 2): Pt will tolerate standing x 5 min during functional task  Skilled Therapeutic Interventions/Progress Updates:    Patient in room tilted back in tilt in space w/c.  Reports family here, but went to get food.  Patient already wearing bilat AFO's.  Discussed needs for d/c and assisted in w/c to therapy gym.  Patient squat pivot to mat with mod A.  Performed sit to stand to [facility] walker and ambulated x 40' with min to mod A for walker management and for balance.  Patient's wife and daughter arrived and report OT had suggested some equipment for home.  Educated in walker options and showed "up walker" on [employer], but then discussed at this point pt not safe for ambulation with family as only just beginning and would likely need to use w/c and transfer device in the home and work on walking in therapy.  Patient sit to stand to eva walker mod A and ambulated x 200' with daughter pushing w/c behind for safety and mod A for balance, turns, walker management and safety with pt demonstrating scissoring pattern with gait and risk for falling.  Patient transported to ortho gym.  Performed sit to stand to [facility] walker mod A and stepped to car with min/mod A.  Assist to pivot hips and lift legs mod A.  Patient assisted to bring legs out and to stand to eva walker mod A.  Stepping to w/c with walker and mod A.  Assisted in w/c to therapy gym in parallel bars.  Demonstrated forward step up onto 4" step.  Patient performed with  mod/max A, then c/o fatigue with knees buckling so daughter brought w/c and pt lowered to chair.  Patient assisted to room in w/c and left with family and NT aware pt needing to return to bed.   Therapy Documentation Precautions:  Precautions Precautions: Cervical, Fall Required Braces or Orthoses: Cervical Brace Cervical Brace: Hard collar, At all times Restrictions Weight Bearing Restrictions: No  Pain: Pain Assessment Pain Scale: 0-10 Pain Score: 7  Faces Pain Scale: Hurts even more Pain Type: Acute pain Pain Location: Neck Pain Orientation: Posterior Pain Descriptors / Indicators: Grimacing;Guarding Pain Onset: With Activity Pain Intervention(s): Repositioned    Therapy/Group: Individual Therapy  Reginia Naas [place], [place] 07/22/2021, 5:28 PM

## 2021-07-22 NOTE — Progress Notes (Signed)
Occupational Therapy Session Note  Patient Details  Name: LASZLO FORNWALT MRN: [id number] Date of Birth: 02/17/1930  Today's Date: 07/22/2021 OT Individual Time: 1300-1355 OT Individual Time Calculation (min): 55 min    Short Term Goals: Week 3:  OT Short Term Goal 1 (Week 3): STG=LTG 2/2 ELOS (continuing working towards min A overall)  Skilled Therapeutic Interventions/Progress Updates:    Pt resting in w/c upon arrival with daughter and wife present. OT intervention with focus on DME recommendations and pt's level of assistance. Pt's daughter will be primary care giver. Pt will require 24/7 assistance. Pt's daughter aware of level of assistance. Family has contacted home health agency to discuss possibility of assistance. Emphasized that pt requires min A for sit<>stand/transfers and sometimes max A. Discussed use of [facility] and information provided to daughter. Considering SNF placement for further rehab. Pt's daughter appeared to be overwhelmed. Emotional support provided and therapeutic listening employted. Pt and daughter at odds over plans and future. Pt remained in w/c with all needs within reach while daughter and wife went to pick up some lunch before PT session.   Therapy Documentation Precautions:  Precautions Precautions: Cervical, Fall Required Braces or Orthoses: Cervical Brace Cervical Brace: Hard collar, At all times Restrictions Weight Bearing Restrictions: No   Pain:  Pt c/o neck discomfort; repositioned in TIS w/c   Therapy/Group: Individual Therapy  [name] 07/22/2021, 2:04 PM

## 2021-07-22 NOTE — Progress Notes (Signed)
Patient is cognizant and competent to make his own decisions. He was unaware what DNR or limited code entailed. Discussed CPR, pressors, cardioversion, intubation--patient reports that his wife did not know what DNR meant, he sees progress and wants to be Full code.

## 2021-07-22 NOTE — Progress Notes (Signed)
Physical Therapy Session Note  Patient Details  Name: Jared Bridges MRN: [id number] Date of Birth: 1929-12-28  Today's Date: 07/22/2021 PT Individual Time: 1000-1030 PT Individual Time Calculation (min): 30 min   Short Term Goals: Week 2:  PT Short Term Goal 1 (Week 2): Pt will complete least restrictive transfer with mod A consistently PT Short Term Goal 2 (Week 2): Pt will initiate gait training with RW PT Short Term Goal 3 (Week 2): Pt will initiate w/c mobility PT Short Term Goal 4 (Week 2): Pt will tolerate standing x 5 min during functional task  Skilled Therapeutic Interventions/Progress Updates:     Pt reclined in TIS w/c to start session - agreeable with no reports of pain. [place] donned throughout session. Pt donned bilateral hearing aids with minA - transported in w/c with totalA for time to main rehab gym. Donned bilateral AFO's with totalA for time. GRAFO in R, PLS in L. Squat<>pivot transfer with modA to mat table, towards R side. Worked on repeated sit<>stands, 1x4 with min/modA. Worked on erect standing posture with cues for "hips forward and shoulders back." Also completed lateral side stepping L<>R along mat table with modA for balance and BUE support on therapist's shoulders (therpaist on stool). With fatigue, noted sliding rather than fully stepping, especially on the R leg. Pt assist back to his chair via squat<>pivot with maxA. Returned to room and remained reclined in TIS w/c with safety belt alarm on, all needs in reach.   Therapy Documentation Precautions:  Precautions Precautions: Cervical, Fall Required Braces or Orthoses: Cervical Brace Cervical Brace: Hard collar, At all times Restrictions Weight Bearing Restrictions: No General:    Therapy/Group: Individual Therapy  Alger Simons 07/22/2021, 7:40 AM

## 2021-07-23 DIAGNOSIS — S14127D Central cord syndrome at C7 level of cervical spinal cord, subsequent encounter: Secondary | ICD-10-CM | POA: Diagnosis not present

## 2021-07-23 LAB — GLUCOSE, CAPILLARY
Glucose-Capillary: 167 mg/dL — ABNORMAL HIGH (ref 70–99)
Glucose-Capillary: 102 mg/dL — ABNORMAL HIGH (ref 70–99)
Glucose-Capillary: 166 mg/dL — ABNORMAL HIGH (ref 70–99)
Glucose-Capillary: 120 mg/dL — ABNORMAL HIGH (ref 70–99)

## 2021-07-23 MED ORDER — POTASSIUM CHLORIDE CRYS ER 20 MEQ PO TBCR
40.0000 meq | EXTENDED_RELEASE_TABLET | Freq: Once | ORAL | Status: AC
  Administered 2021-07-23: 40 meq via ORAL
  Filled 2021-07-23: qty 2

## 2021-07-23 NOTE — Progress Notes (Signed)
   07/23/21 1200  Clinical Encounter Type  Visited With Patient  Visit Type Initial  Referral From Family  Consult/Referral To Chaplain   Chaplain responded to voicemail from Pt's daughter, [name]. Chaplain visited Pt who had just come back from the gym. Pt was concerned that his chart read DNR and asked this chaplain to see if it showed up for me in the system. Chaplain confirmed with Pt that the system is showing Pt is now full code. Chaplain informed Pt that if any assistance is needed with Pt's code status, Pt would need to talk with the medical team as they input code status. Chaplain asked about Advanced Directive and Pt mentioned that his daughter, [name], was filling it out. Pt shared he would call and update his daughter. Chaplain also called [name] to see if she had any questions and there was no answer. Chaplain provided encouragement regarding Pt's promising health news. Chaplain remains available.   This note was prepared by Chaplain Resident, Dante Gang, MDiv. Chaplain remains available as needed through the on-call pager: [phone].

## 2021-07-23 NOTE — Progress Notes (Addendum)
Patient ID: Jared Bridges, male   DOB: 23-May-1930, 87 y.o.   MRN: [id number]  SW waiting on updates from [facility] about referral. *referral accepted for [facility]. SOC on 07/29/2021.  SW received updates from therpay team requesting continued family edu. SW spoke with pt dtr Jared Bridges to discuss. Will be here tomorrow 1pm-3pm. SW to schedule ambulance transport for 11am.  SW scheduled ambulance transport with [facility] [phone]) for 11am on 8/26. SW updated medical team.   [name], MSW, [place] Office: [phone] Cell: [phone] Fax: [phone]

## 2021-07-23 NOTE — Progress Notes (Signed)
Occupational Therapy Session Note  Patient Details  Name: Jared Bridges MRN: [id number] Date of Birth: [birth year]  Today's Date: 07/23/2021 OT Individual Time: 1315-1355 OT Individual Time Calculation (min): 40 min    Short Term Goals: Week 3:  OT Short Term Goal 1 (Week 3): STG=LTG 2/2 ELOS (continuing working towards min A overall)  Skilled Therapeutic Interventions/Progress Updates:    Pt missed 20 mins skilled OT services while Pastoral Care consulting pt regarding Advanced Directives. OT intervention with focus on BUE FMC/strengthening/manipulation skills with beads in theraputty. Pt used yellow theraputty. Pt with increased [place] and strength in Bil hands. Pt returned to room and performed stand pivot transfer to bed with mod A. Supervision for sit>supine. Pt required assistance repositioning in bed. Pt reamained in bed with all needs within reach and bed alarm activated.   Therapy Documentation Precautions:  Precautions Precautions: Cervical, Fall Required Braces or Orthoses: Cervical Brace Cervical Brace: Hard collar, At all times Restrictions Weight Bearing Restrictions: No General: General OT Amount of Missed Time: 20 Minutes  Pain:  Pt reports that pain is controlled at this time   Therapy/Group: Individual Therapy  [name] 07/23/2021, 2:01 PM

## 2021-07-23 NOTE — Progress Notes (Addendum)
PROGRESS NOTE   Subjective/Complaints:  Cannot take collar off- since had fracture of spine.   Is made Full code - pt thinks that';s what he wants- we discussed at length, going over what this means and what chest compressions and intubation mean.     ROS:   Pt denies SOB, abd pain, CP, N/V/C/D, and vision changes    Objective:   No results found. Recent Labs    07/22/21 0600  WBC 5.7  HGB 10.8*  HCT 34.0*  PLT 230    Recent Labs    07/22/21 0600  NA 138  K 3.4*  CL 109  CO2 24  GLUCOSE 112*  BUN 21  CREATININE 0.99  CALCIUM 8.2*     Intake/Output Summary (Last 24 hours) at 07/23/2021 0918 Last data filed at 07/23/2021 0813 Gross per 24 hour  Intake 720 ml  Output --  Net 720 ml        Physical Exam: Vital Signs Blood pressure (!) 141/66, pulse 79, temperature 97.7 F (36.5 C), temperature source Oral, resp. rate 18, height '6\' 2"'$  (1.88 m), weight 73 kg, SpO2 98 %.   General: awake, alert, appropriate,  sitting up somewhat in bed; NAD HENT: conjugate gaze; oropharynx moist CV: regular rate; no JVD Pulmonary: CTA B/L; no W/R/R- good air movement GI: soft, NT, ND, (+)BS Psychiatric: bright, HOH Neurological: alert;    Musc: No edema in extremities.  No tenderness in extremities. Neurological: Alert Motor: Bilateral upper extremities: Shoulder abduction  3/5, elbow flexion 4 -/5, elbow extension 3- left 3+ right /5, distally 4/5, left side still generally weaker than right. LE's 3+ to 4/5  Assessment/Plan: 1. Functional deficits which require 3+ hours per day of interdisciplinary therapy in a comprehensive inpatient rehab setting. Physiatrist is providing close team supervision and 24 hour management of active medical problems listed below. Physiatrist and rehab team continue to assess barriers to discharge/monitor patient progress toward functional and medical goals  Care  Tool:  Bathing    Body parts bathed by patient: Face, Abdomen, Chest, Right upper leg, Left upper leg, Left arm   Body parts bathed by helper: Right arm, Front perineal area, Buttocks, Right lower leg, Left lower leg     Bathing assist Assist Level: Maximal Assistance - Patient 24 - 49%     Upper Body Dressing/Undressing Upper body dressing   What is the patient wearing?: Pull over shirt    Upper body assist Assist Level: Moderate Assistance - Patient 50 - 74%    Lower Body Dressing/Undressing Lower body dressing      What is the patient wearing?: Incontinence brief, Pants     Lower body assist Assist for lower body dressing: Maximal Assistance - Patient 25 - 49%     Toileting Toileting Toileting Activity did not occur (Clothing management and hygiene only): N/A (no void or bm)  Toileting assist Assist for toileting: Dependent - Patient 0%     Transfers Chair/bed transfer  Transfers assist     Chair/bed transfer assist level: Minimal Assistance - Patient > 75%     Locomotion Ambulation   Ambulation assist   Ambulation activity did not occur: Safety/medical concerns  Assist level: 2 helpers Assistive device: Ethelene Hal Max distance: 130'   Walk 10 feet activity   Assist  Walk 10 feet activity did not occur: Safety/medical concerns  Assist level: 2 helpers Assistive device: Walker-Eva   Walk 50 feet activity   Assist Walk 50 feet with 2 turns activity did not occur: Safety/medical concerns  Assist level: 2 helpers Assistive device: [employer] 150 feet activity   Assist Walk 150 feet activity did not occur: Safety/medical concerns         Walk 10 feet on uneven surface  activity   Assist Walk 10 feet on uneven surfaces activity did not occur: Safety/medical concerns         Wheelchair     Assist Is the patient using a wheelchair?: No Type of Wheelchair: Manual    Wheelchair assist level: Dependent - Patient  0% Max wheelchair distance: 10 ft    Wheelchair 50 feet with 2 turns activity    Assist    Wheelchair 50 feet with 2 turns activity did not occur: Safety/medical concerns   Assist Level: Dependent - Patient 0%   Wheelchair 150 feet activity     Assist  Wheelchair 150 feet activity did not occur: Safety/medical concerns       Blood pressure (!) 141/66, pulse 79, temperature 97.7 F (36.5 C), temperature source Oral, resp. rate 18, height '6\' 2"'$  (1.88 m), weight 73 kg, SpO2 98 %.  Medical Problem List and Plan: 1.  Functional and mobility deficits secondary to fall with C6/7 fx and EDH and subsequent cervical cord compression at that level. He is a C7 Central cord, left side more affected than right.  -Continue CIR therapies including PT, OT . Family might be looking at placement   -con't PT and OT/CIR 2.  Antithrombotics: -DVT/anticoagulation:  Mechanical: Sequential compression devices, below knee Bilateral lower extremities             -antiplatelet therapy: N/A 3. Pain Management: Continue tylenol TID with Oxycodone prn  8/23- pain controlled except discomfort from collar- but cannot remove- con't regimen 4. Mood: LCSW to follow for evaluation and support/              -antipsychotic agents: N/A 5. Neuropsych: This patient is capable of making decisions on his own behalf. 6. Skin/Wound Care: routine pressure relief measures.  7. Fluids/Electrolytes/Nutrition: Monitor I/Os. Check BMET on 08/08 8. HTN:   Controlled on 8/19 9. T2DM: Monitor BS ac/hs. Use SSI for elevated BS  Changed to carb modified diet-  8/10- A1c is 6.4  Metformin started on 8/14, increased to 250 mg BID   CBG (last 3)  Recent Labs    07/22/21 1625 07/22/21 2110 07/23/21 0609  GLUCAP 132* 147* 167*   8/23 con't regimen 10. PMR with peripheral neuropathy: continue Topamax, Lyrica, 11. Prostate cancer w/bony mets:  Treated with XRT 01/2021 at [facility] --On Xandi with proscar 12. Wet voice:  improved  Flutter valve ordered Albuterol nebs   13. H/o depression: ON Effexor.  14. Constipation:    -component of neurogenic bowel- incontinence when sleeps sometimes- con't off bowel program per pt request--typically continent more than he's not, bm's are regular 15. Urinary incontinence:  -timed voids  -PVR's low  -emptying but typically incontinent  16.  Sleep disturbance  8/15- stopped trazodone and started Ambien 5 mg at 8pm daily.   Improved sleep patterns 17.  Acute blood loss anemia  Hemoglobin 11.1 on 8/15  18. Hypokalemia  8/23- will replete with 40 mEq x1   I spent a total of 38 minutes on pt- >50% coordination of care- d/w pt about DNR vs full code and team conference.    LOS: 18 days A FACE TO FACE EVALUATION WAS PERFORMED  Sylar Voong 07/23/2021, 9:18 AM

## 2021-07-23 NOTE — Progress Notes (Signed)
Physical Therapy Session Note  Patient Details  Name: Jared Bridges MRN: [id number] Date of Birth: [birth year]  Today's Date: 07/23/2021 PT Individual Time: 0800-0925 PT Individual Time Calculation (min): 85 min      Short Term Goals: Week 1:  PT Short Term Goal 1 (Week 1): Pt will perform bed mobility with consistent Min A following cervical/ spinal precautions. PT Short Term Goal 1 - Progress (Week 1): Progressing toward goal PT Short Term Goal 2 (Week 1): Pt will perform STS with Min A using RW. PT Short Term Goal 2 - Progress (Week 1): Not met PT Short Term Goal 3 (Week 1): Pt will perform SPVT with consistent Min A and reduced pain using RW. PT Short Term Goal 3 - Progress (Week 1): Not met PT Short Term Goal 4 (Week 1): Pt will ambulate at least 20 feet with Min A using RW. PT Short Term Goal 4 - Progress (Week 1): Not met Week 2:  PT Short Term Goal 1 (Week 2): Pt will complete least restrictive transfer with mod A consistently PT Short Term Goal 2 (Week 2): Pt will initiate gait training with RW PT Short Term Goal 3 (Week 2): Pt will initiate w/c mobility PT Short Term Goal 4 (Week 2): Pt will tolerate standing x 5 min during functional task Week 3:     Skilled Therapeutic Interventions/Progress Updates:   Pain:  Pt reports tingling quality distal finger pain.  Treatment to tolerance.  Rest breaks and repositioning as needed.  Pt initially supine, awake, alert and agreeable to treatment session.  Pt able to raise legs for threading of pants then bridges for therapist to raise pants.  Therapist repositions ccollar for appropriate fit.  Supine to side to sit w/mod assist. In sitting, pt dons jacket max assist and shoes/AFOs w/total assist Maintains sitting balance w/supervision. Sit to stand from elevated bed w/min assist of 2/HHA. Stood x 1 min w/HE at L knee, cues for posture, HHA/min of 2 Attmepted 2 steps w/HHA/overall mod Repeated Sit to stand and stand pivot transfer to  wc w/mod assist of 1. Pt assisted w/donning hearing aids then transported to gym. Gait 96f, 419fw/Eva walker, cues for posture, use of tape line on floor to promote widened base gait, intermittentet extension thrust at knees w/loading, "sinks" into trunk flexion but able to correct w/cues/ability to correct wanes w/fatigue, much difficlty maintaining LUE on L platform of evawalker/depends heavily on UE support for upright trunk.   Requires extended seated rest between efforts.  Sit to stand from wc w/cues for scooting in prep and use of hands w/transition.  Pt stood w/HHA of 1 and using R hand transferred horseshoes from table to R to basketball hoop in front of pt to promote standing posture/tolerance/UE strengthening.   Repeated on L but without horseshoes due to LUE lack of control w/attempt.  Pt transportd to room.  Pt left oob in wc w/alarm belt set and needs in reach  Recommended pt take rest break in bed later in am prior to OT session.    Therapy Documentation Precautions:  Precautions Precautions: Cervical, Fall Required Braces or Orthoses: Cervical Brace Cervical Brace: Hard collar, At all times Restrictions Weight Bearing Restrictions: No     Therapy/Group: Individual Therapy BaCallie FieldingPTWentworth/23/2022, 5:28 PM

## 2021-07-23 NOTE — Patient Care Conference (Signed)
Inpatient RehabilitationTeam Conference and Plan of Care Update Date: 07/23/2021   Time: 11:03 AM    Patient Name: Jared Bridges      Medical Record Number: [id number]  Date of Birth: Sep 04, 1930 Sex: Male         Room/Bed: 4W26C/4W26C-01 Payor Info: Payor: Friona / Plan: [employer] MEDICARE / Product Type: *No Product type* /    Admit Date/Time:  07/05/2021  8:24 PM  Primary Diagnosis:  Central cord syndrome at C7 level of cervical spinal cord [facility])  Hospital Problems: Principal Problem:   Central cord syndrome at C7 level of cervical spinal cord ([place]) Active Problems:   Urinary incontinence   Controlled type 2 diabetes mellitus with hyperglycemia, without long-term current use of insulin ([place])   Acute blood loss anemia   Sleep disturbance    Expected Discharge Date: Expected Discharge Date: 07/26/21  Team Members Present: Physician leading conference: Dr. Courtney Heys Social Worker Present: [name], [place] Nurse Present: [name], RN PT Present: Leavy Cella, PT OT Present: Roanna Epley, [place], OT PPS Coordinator present : Ileana Ladd, PT     Current Status/Progress Goal Weekly Team Focus  Bowel/Bladder   Contient of bowel and incontinent of urine: LBM 8/22  Continent of bowel and bladder with minimal assistance  Assess bowel and bladder needs q shfit and PRN and offer toileting q2 hours while awake.   Swallow/Nutrition/ Hydration             ADL's   bed mobility-min A; bathing-mod A: UB dressing-mod A: LB dressing-max A: self feeding-min A; limited activity tolerance; functional transfers-min A to max A  min A overall  activity tolerance, sitting balance, functional tranfsers, BADL training   Mobility   squat pivot mot-max assist, amb 236f with EVA walker with unsteady pattern, mod assist bed mobility  Supervision transfers, CGA gait and stairs  transfers, strengthening, safety, gait training   Communication              Safety/Cognition/ Behavioral Observations            Pain   Neck pain 4/10 tylenol PO administered.  Pain less than or equal to 2/10  Assess pain q shift and prn and medicate as needed.   Skin   Abrasion to bilateral arms and right lower extremity  Remain free from skin breakdown and infection  Assess skin q shfit and as needed     Discharge Planning:  ? SNF vs Home. Pt to d/c to home with support from his dtr ERaquel Sarnaand wife.   Team Discussion: Now full code. Collar must stay on in bed due to fracture in neck. Medically working on CBG's. Incontinent B/B, denies pain, unable to sleep well due to brace, has bruising and skin tears to left arm. Education provided to daughter for assistance needs, equipment needs. Gave information about stedy. Daughter is overwhelmed. Mod/max assist squat pivot, unsafe patterns of gait. Max assist for STS. Patient on target to meet rehab goals: yes  *See Care Plan and progress notes for long and short-term goals.   Revisions to Treatment Plan:  Status changed to full code.  Teaching Needs: Family education, medication management, skin/wound care, bowel/bladder management, sleep management, transfer training, gait training, balance training, endurance training, stair training, safety awareness.  Current Barriers to Discharge: Decreased caregiver support, Medical stability, Home enviroment access/layout, Incontinence, Wound care, Lack of/limited family support, Weight, Weight bearing restrictions, and Medication compliance  Possible Resolutions to Barriers: Continue current  medications, provide emotional support.     Medical Summary Current Status: + 3.4; LBM yesterday- incontinent B/B- both; not sleeping well due to cervical collar; LUE skin tears; central cord synrome/Quadriplegia  Barriers to Discharge: Decreased family/caregiver support;Home enviroment access/layout;Weight;Weight bearing restrictions;Medical stability;Incontinence;Neurogenic Bowel &  Bladder;Wound care  Barriers to Discharge Comments: might be placement/SNF; will confirm with daughter- pt's wife has dementia- Possible Resolutions to [employer] Focus: focus- repleted K+ 40 mEq x1; family training- still needs more; made full code/but checking to see if wants DNR? paperwork says DNR;  d/c 8/26 for now   Continued Need for Acute Rehabilitation Level of Care: The patient requires daily medical management by a physician with specialized training in physical medicine and rehabilitation for the following reasons: Direction of a multidisciplinary physical rehabilitation program to maximize functional independence : Yes Medical management of patient stability for increased activity during participation in an intensive rehabilitation regime.: Yes Analysis of laboratory values and/or radiology reports with any subsequent need for medication adjustment and/or medical intervention. : Yes   I attest that I was present, lead the team conference, and concur with the assessment and plan of the team.   [name] 07/23/2021, 3:40 PM

## 2021-07-24 LAB — GLUCOSE, CAPILLARY
Glucose-Capillary: 114 mg/dL — ABNORMAL HIGH (ref 70–99)
Glucose-Capillary: 116 mg/dL — ABNORMAL HIGH (ref 70–99)
Glucose-Capillary: 128 mg/dL — ABNORMAL HIGH (ref 70–99)
Glucose-Capillary: 127 mg/dL — ABNORMAL HIGH (ref 70–99)

## 2021-07-24 NOTE — Progress Notes (Signed)
Occupational Therapy Session Note  Patient Details  Name: Jared Bridges MRN: [id number] Date of Birth: 1930/01/09  Today's Date: 07/24/2021 OT Individual Time: [id number] OT Individual Time Calculation (min): 30 min    Short Term Goals: Week 3:  OT Short Term Goal 1 (Week 3): STG=LTG 2/2 ELOS (continuing working towards min A overall)  Skilled Therapeutic Interventions/Progress Updates:    Pt resting in w/c upon arrival. Pt stated he was "worn out" from earlier therapy session with PT. Sit<>stand with PFRW with min A. Stand pivot transfer with PFRW at min A and min verbal cues for sequencing and safety. Sit>supine in bed with supervision. Pt able to reposition with CGA. Continued discussion regarding progress and discharge home 8/26. Pt pleased with progress but realizes that he still has "a long way to go." Pt remained in bed with all needs within reach and bed alarm activated.   Therapy Documentation Precautions:  Precautions Precautions: Cervical, Fall Required Braces or Orthoses: Cervical Brace Cervical Brace: Hard collar, At all times Restrictions Weight Bearing Restrictions: No General: General OT Amount of Missed Time: 45 Minutes   Pain: Pain Assessment Pain Scale: 0-10 Pain Score: 3  Pain Type: Acute pain Pain Location: Neck Pain Orientation: Posterior Pain Descriptors / Indicators: Aching Pain Onset: With Activity Pain Intervention(s): Repositioned   Therapy/Group: Individual Therapy  [name] 07/24/2021, 11:41 AM

## 2021-07-24 NOTE — Discharge Instructions (Addendum)
   Inpatient Rehab Discharge Instructions  Jared Bridges Discharge date and time: 07/26/21   Activities/Precautions/ Functional Status: Activity: no lifting, driving, or strenuous exercise till cleared by MD Diet: diabetic diet Wound Care: none needed   Functional status:  ___ No restrictions     ___ Walk up steps independently _X__ 24/7 supervision/assistance   ___ Walk up steps with assistance ___ Intermittent supervision/assistance  ___ Bathe/dress independently ___ Walk with walker     _X__ Bathe/dress with assistance ___ Walk Independently    ___ Shower independently ___ Walk with assistance    ___ Shower with assistance ___ No alcohol     ___ Return to work/school ________   Special Instructions: Wear collar on at all times No strenuous activity. Do not sit in one place for a long time.   COMMUNITY REFERRALS UPON DISCHARGE:    Home Health:   PT     OT     SNA                     Agency: [facility]  Phone: [phone] *Please expect follow-up on Monday (8/29) to schedule your home visit. If you have not received follow-up, be sure to contact the agency directly.*   Medical Equipment/Items Ordered:                                                 Agency/Supplier:    My questions have been answered and I understand these instructions. I will adhere to these goals and the provided educational materials after my discharge from the hospital.  Patient/Caregiver Signature _______________________________ Date __________  Clinician Signature _______________________________________ Date __________  Please bring this form and your medication list with you to all your follow-up doctor's appointments.

## 2021-07-24 NOTE — Progress Notes (Signed)
PROGRESS NOTE   Subjective/Complaints:  Wearing aspen collar-  Less control of bladder but bowels "OK"-  Muscle jerks doing better.    ROS:    Pt denies SOB, abd pain, CP, N/V/C/D, and vision changes   Objective:   No results found. Recent Labs    07/22/21 0600  WBC 5.7  HGB 10.8*  HCT 34.0*  PLT 230    Recent Labs    07/22/21 0600  NA 138  K 3.4*  CL 109  CO2 24  GLUCOSE 112*  BUN 21  CREATININE 0.99  CALCIUM 8.2*     Intake/Output Summary (Last 24 hours) at 07/24/2021 1944 Last data filed at 07/24/2021 1859 Gross per 24 hour  Intake 840 ml  Output --  Net 840 ml        Physical Exam: Vital Signs Blood pressure (!) 112/55, pulse 66, temperature 98.5 F (36.9 C), temperature source Oral, resp. rate 18, height '6\' 2"'$  (1.88 m), weight 73 kg, SpO2 98 %.    General: awake, alert, appropriate, sitting up in bed; wearing Aspen collar; NAD HENT: conjugate gaze; oropharynx moist CV: regular rate; no JVD Pulmonary: CTA B/L; no W/R/R- good air movement GI: soft, NT, ND, (+)BS Psychiatric: appropriate Neurological: Ox3; MAS of 1 in LE's and arms  Musc: No edema in extremities.  No tenderness in extremities. Neurological: Alert Motor: Bilateral upper extremities: Shoulder abduction  3/5, elbow flexion 4 -/5, elbow extension 3- left 3+ right /5, distally 4/5, left side still generally weaker than right. LE's 3+ to 4/5  Assessment/Plan: 1. Functional deficits which require 3+ hours per day of interdisciplinary therapy in a comprehensive inpatient rehab setting. Physiatrist is providing close team supervision and 24 hour management of active medical problems listed below. Physiatrist and rehab team continue to assess barriers to discharge/monitor patient progress toward functional and medical goals  Care Tool:  Bathing    Body parts bathed by patient: Face, Abdomen, Chest, Right upper leg, Left  upper leg, Left arm   Body parts bathed by helper: Right arm, Front perineal area, Buttocks, Right lower leg, Left lower leg     Bathing assist Assist Level: Maximal Assistance - Patient 24 - 49%     Upper Body Dressing/Undressing Upper body dressing   What is the patient wearing?: Pull over shirt    Upper body assist Assist Level: Moderate Assistance - Patient 50 - 74%    Lower Body Dressing/Undressing Lower body dressing      What is the patient wearing?: Incontinence brief, Pants     Lower body assist Assist for lower body dressing: Maximal Assistance - Patient 25 - 49%     Toileting Toileting Toileting Activity did not occur (Clothing management and hygiene only): N/A (no void or bm)  Toileting assist Assist for toileting: Dependent - Patient 0%     Transfers Chair/bed transfer  Transfers assist     Chair/bed transfer assist level: Moderate Assistance - Patient 50 - 74%     Locomotion Ambulation   Ambulation assist   Ambulation activity did not occur: Safety/medical concerns  Assist level: 2 helpers Assistive device: Walker-platform Max distance: 60   Walk 10 feet  activity   Assist  Walk 10 feet activity did not occur: Safety/medical concerns  Assist level: 2 helpers Assistive device: Walker-platform   Walk 50 feet activity   Assist Walk 50 feet with 2 turns activity did not occur: Safety/medical concerns  Assist level: 2 helpers Assistive device: Walker-platform    Walk 150 feet activity   Assist Walk 150 feet activity did not occur: Safety/medical concerns         Walk 10 feet on uneven surface  activity   Assist Walk 10 feet on uneven surfaces activity did not occur: Safety/medical concerns         Wheelchair     Assist Is the patient using a wheelchair?: Yes Type of Wheelchair: Manual    Wheelchair assist level: Dependent - Patient 0% (tilt in space) Max wheelchair distance: 10 ft    Wheelchair 50 feet with 2  turns activity    Assist    Wheelchair 50 feet with 2 turns activity did not occur: Safety/medical concerns   Assist Level: Dependent - Patient 0%   Wheelchair 150 feet activity     Assist  Wheelchair 150 feet activity did not occur: Safety/medical concerns   Assist Level: Dependent - Patient 0%   Blood pressure (!) 112/55, pulse 66, temperature 98.5 F (36.9 C), temperature source Oral, resp. rate 18, height '6\' 2"'$  (1.88 m), weight 73 kg, SpO2 98 %.  Medical Problem List and Plan: 1.  Functional and mobility deficits secondary to fall with C6/7 fx and EDH and subsequent cervical cord compression at that level. He is a C7 Central cord, left side more affected than right.  -Continue CIR therapies including PT, OT . Family might be looking at placement   -con't PT and OT and CIR 2.  Antithrombotics: -DVT/anticoagulation:  Mechanical: Sequential compression devices, below knee Bilateral lower extremities             -antiplatelet therapy: N/A 3. Pain Management: Continue tylenol TID with Oxycodone prn  8/23- pain controlled except discomfort from collar- but cannot remove- con't regimen 4. Mood: LCSW to follow for evaluation and support/              -antipsychotic agents: N/A 5. Neuropsych: This patient is capable of making decisions on his own behalf. 6. Skin/Wound Care: routine pressure relief measures.  7. Fluids/Electrolytes/Nutrition: Monitor I/Os. Check BMET on 08/08 8. HTN:   Controlled on 8/19 9. T2DM: Monitor BS ac/hs. Use SSI for elevated BS  Changed to carb modified diet-  8/10- A1c is 6.4  Metformin started on 8/14, increased to 250 mg BID   CBG (last 3)  Recent Labs    07/24/21 0629 07/24/21 1131 07/24/21 1631  GLUCAP 114* 116* 128*   8/24- BG's controlled- con T regimen 10. PMR with peripheral neuropathy: continue Topamax, Lyrica, 11. Prostate cancer w/bony mets:  Treated with XRT 01/2021 at [facility] --On Xandi with proscar 12. Wet voice:  improved  Flutter valve ordered Albuterol nebs   13. H/o depression: ON Effexor.  14. Constipation:    -component of neurogenic bowel- incontinence when sleeps sometimes- con't off bowel program per pt request--typically continent more than he's not, bm's are regular  8/24- Pt doesn't want bowel program 15. Urinary incontinence:  -timed voids  -PVR's low  -emptying but typically incontinent 8/24- no change  16.  Sleep disturbance  8/15- stopped trazodone and started Ambien 5 mg at 8pm daily.   Improved sleep patterns 17.  Acute blood loss anemia  Hemoglobin 11.1 on 8/15     18. Hypokalemia  8/23- will replete with 40 mEq x1   LOS: 19 days A FACE TO FACE EVALUATION WAS PERFORMED  Lakelyn Straus 07/24/2021, 7:44 PM

## 2021-07-24 NOTE — Progress Notes (Signed)
Physical Therapy Session Note  Patient Details  Name: Jared Bridges MRN: [id number] Date of Birth: 10/01/1930  Today's Date: 07/24/2021 PT Individual Time: 0902-1002 PT Individual Time Calculation (min): 60 min   Short Term Goals: Week 3:  PT Short Term Goal 1 (Week 2): Pt will complete least restrictive transfer with mod A consistently PT Short Term Goal 2 (Week 2): Pt will initiate gait training with RW PT Short Term Goal 3 (Week 2): Pt will initiate w/c mobility PT Short Term Goal 4 (Week 2): Pt will tolerate standing x 5 min during functional task  Skilled Therapeutic Interventions/Progress Updates:    Patient in supine and reports slept okay.  States plans are still for going home Friday.  Patient's brief soiled with urine so changed brief in supine rolling with CGA and dependent for hygiene and donning new brief.  Patient performed side to sit min A for trunk and cues.  Donned pants at EOB with max A, pt attempting to thread legs in while holding pants, but assist to get feet in appropriately.  Donned shoes and AFO's at EOB total A.  Patient min to mod A to stand to RW for completing pulling up pants.  Attempted stand step with RW to w/c, but pt's legs gave out and sat on EOB.  Performed squat pivot max A to tilt in space w/c.  Patient transported to therapy gym and fitted RW with platforms bilaterally.  Patient sit to stand to [place] cues for reaching to pull up on front of walker, rather than to platforms.  Walker stabilized and mod A to stand.  Patient ambulated with PFRW and bilat AFO's x 60' x 2 with +2 for w/c follow mod to max A to stabilize walker, cues for feet apart and facilitation for R hip stabilization.  Patient standing at steps holding rails for step taps to 6" step x 5 reps each leg with increased time for weight shift and stabilizing with 1 foot.  Patient assisted in w/c to room.  Left seated in tilt in space w/c with call bell and needs in reach and seat belt alarm active.    Therapy Documentation Precautions:  Precautions Precautions: Cervical, Fall Required Braces or Orthoses: Cervical Brace Cervical Brace: Hard collar, At all times Restrictions Weight Bearing Restrictions: No  Pain Assessment Pain Scale: 0-10 Pain Score: 3  Pain Type: Acute pain Pain Location: Neck Pain Orientation: Posterior Pain Descriptors / Indicators: Aching Pain Onset: With Activity Pain Intervention(s): Repositioned   Therapy/Group: Individual Therapy  Reginia Naas Magda Kiel, PT 07/24/2021, 10:36 AM

## 2021-07-25 LAB — GLUCOSE, CAPILLARY
Glucose-Capillary: 118 mg/dL — ABNORMAL HIGH (ref 70–99)
Glucose-Capillary: 119 mg/dL — ABNORMAL HIGH (ref 70–99)
Glucose-Capillary: 128 mg/dL — ABNORMAL HIGH (ref 70–99)
Glucose-Capillary: 146 mg/dL — ABNORMAL HIGH (ref 70–99)

## 2021-07-25 MED ORDER — ACETAMINOPHEN 325 MG PO TABS: 650.0000 mg | ORAL_TABLET | Freq: Three times a day (TID) | ORAL | Status: DC

## 2021-07-25 MED ORDER — OXYCODONE HCL 5 MG PO TABS: 5.0000 mg | ORAL_TABLET | Freq: Four times a day (QID) | ORAL | 0 refills | Status: DC | PRN

## 2021-07-25 MED ORDER — METFORMIN HCL 500 MG PO TABS: 250.0000 mg | ORAL_TABLET | Freq: Two times a day (BID) | ORAL | 1 refills | Status: DC

## 2021-07-25 MED ORDER — ZOLPIDEM TARTRATE 5 MG PO TABS: 5.0000 mg | ORAL_TABLET | Freq: Every day | ORAL | 0 refills | Status: DC

## 2021-07-25 NOTE — Progress Notes (Signed)
Inpatient Rehabilitation Care Coordinator Discharge Note   Patient Details  Name: Jared Bridges MRN: [id number] Date of Birth: May 20, 1930   Discharge location: D/c to home with 24/7 care.  Length of Stay: 20 days  Discharge activity level: Mod A to Max A  Home/community participation: Limited  Patient response [id number] Literacy - How often do you need to have someone help you when you read instructions, pamphlets, or other written material from your doctor or pharmacy?: Never  Patient response [id number] Isolation - How often do you feel lonely or isolated from those around you?: Never  Services provided included: MD, RD, PT, OT, SLP, RN, CM, TR, Pharmacy, Neuropsych, SW  Financial Services:  Charity fundraiser Utilized: Other (Comment) [facility] Medicare)    Choices offered to/list presented to: yes  Follow-up services arranged:  Home Health, DME Home Health Agency: [facility] on 8/29    DME : platform attachments and over bed table with [place]    Patient response to transportation need: Is the patient able to respond to transportation needs?: Yes In the past 12 months, has lack of transportation kept you from medical appointments or from getting medications?: No In the past 12 months, has lack of transportation kept you from meetings, work, or from getting things needed for daily living?: No  Comments (or additional information):Pt dtr intends to hire private aide care.   Patient/Family verbalized understanding of follow-up arrangements:  Yes  Individual responsible for coordination of the follow-up plan: contact pt dtr Jared Bridges [phone]  Confirmed correct DME delivered: Rana Snare 07/25/2021    Rana Snare

## 2021-07-25 NOTE — Progress Notes (Signed)
PROGRESS NOTE   Subjective/Complaints:  Pt awoke at 4am again, but was able to go back to sleep- ready for d/c tomorrow.   ROS:   Pt denies SOB, abd pain, CP, N/V/C/D, and vision changes    Objective:   No results found. No results for input(s): WBC, HGB, HCT, PLT in the last 72 hours.   No results for input(s): NA, K, CL, CO2, GLUCOSE, BUN, CREATININE, CALCIUM in the last 72 hours.    Intake/Output Summary (Last 24 hours) at 07/25/2021 0910 Last data filed at 07/25/2021 0700 Gross per 24 hour  Intake 780 ml  Output 1 ml  Net 779 ml        Physical Exam: Vital Signs Blood pressure 118/62, pulse 64, temperature 98.5 F (36.9 C), temperature source Oral, resp. rate 18, height '6\' 2"'$  (1.88 m), weight 73 kg, SpO2 96 %.    General: awake, alert, appropriate, sleeping initially; AD HENT: conjugate gaze; oropharynx moist; wearing cervical collar CV: regular rate; no JVD Pulmonary: CTA B/L; no W/R/R- good air movement GI: soft, NT, ND, (+)BS Psychiatric: appropriate; interactive Neurological: Ox3 MAS of 1+- Musc: No edema in extremities.  No tenderness in extremities. Neurological: Alert Motor: Bilateral upper extremities: Shoulder abduction  3/5, elbow flexion 4 -/5, elbow extension 3- left 3+ right /5, distally 4/5, left side still generally weaker than right. LE's 3+ to 4/5  Assessment/Plan: 1. Functional deficits which require 3+ hours per day of interdisciplinary therapy in a comprehensive inpatient rehab setting. Physiatrist is providing close team supervision and 24 hour management of active medical problems listed below. Physiatrist and rehab team continue to assess barriers to discharge/monitor patient progress toward functional and medical goals  Care Tool:  Bathing    Body parts bathed by patient: Face, Abdomen, Chest, Right upper leg, Left upper leg, Left arm   Body parts bathed by helper: Right  arm, Front perineal area, Buttocks, Right lower leg, Left lower leg     Bathing assist Assist Level: Maximal Assistance - Patient 24 - 49%     Upper Body Dressing/Undressing Upper body dressing   What is the patient wearing?: Pull over shirt    Upper body assist Assist Level: Moderate Assistance - Patient 50 - 74%    Lower Body Dressing/Undressing Lower body dressing      What is the patient wearing?: Incontinence brief, Pants     Lower body assist Assist for lower body dressing: Maximal Assistance - Patient 25 - 49%     Toileting Toileting Toileting Activity did not occur (Clothing management and hygiene only): N/A (no void or bm)  Toileting assist Assist for toileting: Dependent - Patient 0%     Transfers Chair/bed transfer  Transfers assist     Chair/bed transfer assist level: Moderate Assistance - Patient 50 - 74%     Locomotion Ambulation   Ambulation assist   Ambulation activity did not occur: Safety/medical concerns  Assist level: 2 helpers Assistive device: Walker-platform Max distance: 60   Walk 10 feet activity   Assist  Walk 10 feet activity did not occur: Safety/medical concerns  Assist level: 2 helpers Assistive device: Walker-platform   Walk 50 feet activity  Assist Walk 50 feet with 2 turns activity did not occur: Safety/medical concerns  Assist level: 2 helpers Assistive device: Walker-platform    Walk 150 feet activity   Assist Walk 150 feet activity did not occur: Safety/medical concerns         Walk 10 feet on uneven surface  activity   Assist Walk 10 feet on uneven surfaces activity did not occur: Safety/medical concerns         Wheelchair     Assist Is the patient using a wheelchair?: Yes Type of Wheelchair: Manual    Wheelchair assist level: Dependent - Patient 0% (tilt in space) Max wheelchair distance: 10 ft    Wheelchair 50 feet with 2 turns activity    Assist    Wheelchair 50 feet  with 2 turns activity did not occur: Safety/medical concerns   Assist Level: Dependent - Patient 0%   Wheelchair 150 feet activity     Assist  Wheelchair 150 feet activity did not occur: Safety/medical concerns   Assist Level: Dependent - Patient 0%   Blood pressure 118/62, pulse 64, temperature 98.5 F (36.9 C), temperature source Oral, resp. rate 18, height '6\' 2"'$  (1.88 m), weight 73 kg, SpO2 96 %.  Medical Problem List and Plan: 1.  Functional and mobility deficits secondary to fall with C6/7 fx and EDH and subsequent cervical cord compression at that level. He is a C7 Central cord, left side more affected than right.  -Con't PT and OT today- d/c tomorrow to home with daughter 2.  Antithrombotics: -DVT/anticoagulation:  Mechanical: Sequential compression devices, below knee Bilateral lower extremities             -antiplatelet therapy: N/A 3. Pain Management: Continue tylenol TID with Oxycodone prn  8/23- pain controlled except discomfort from collar- but cannot remove- con't regimen  8/25- neck pain from collar- con't oxycodone as needed 4. Mood: LCSW to follow for evaluation and support/              -antipsychotic agents: N/A 5. Neuropsych: This patient is capable of making decisions on his own behalf. 6. Skin/Wound Care: routine pressure relief measures.  7. Fluids/Electrolytes/Nutrition: Monitor I/Os. Check BMET on 08/08 8. HTN:   8/25- BP controlled- con't regimen 9. T2DM: Monitor BS ac/hs. Use SSI for elevated BS  Changed to carb modified diet-  8/10- A1c is 6.4  Metformin started on 8/14, increased to 250 mg BID   CBG (last 3)  Recent Labs    07/24/21 1631 07/24/21 2045 07/25/21 0605  GLUCAP 128* 127* 118*   8/25- BG's controlled- con't regimen 10. PMR with peripheral neuropathy: continue Topamax, Lyrica, 11. Prostate cancer w/bony mets:  Treated with [facility]RT 01/[facility] at [facility] --On Xandi with proscar 12. Wet voice: improved  Flutter valve ordered Albuterol nebs    13. H/o depression: ON Effexor.  14. Constipation:    -component of neurogenic bowel- incontinence when sleeps sometimes- con't off bowel program per pt request--typically continent more than he's not, bm's are regular  8/24- Pt doesn't want bowel program 15. Urinary incontinence:  -timed voids  -PVR's low  -emptying but typically incontinent 8/24- no change  16.  Sleep disturbance  8/15- stopped trazodone and started Ambien 5 mg at 8pm daily.   Improved sleep patterns 17.  Acute blood loss anemia  Hemoglobin 11.1 on 8/15     18. Hypokalemia  8/23- will replete with 40 mEq x1 19. Dispo  8/25- d/c tomorrow  Patient meets criteria for a  power w/c- due to incomplete quadriplegia- cannot do his own pressure relief- he can walk VERY short distances with PT but unsafe for home at this time to walk- Also has impaired hand function- will also need pressure relieving cushion to prevent pressure ulcers.  Will need elevating leg rests due to variable edema.  Also needs elevation/tilt in space and recline due to transfer difficulty- age- is 87 yrs old.     LOS: 20 days A FACE TO FACE EVALUATION WAS PERFORMED  Anysha Frappier 07/25/2021, 9:10 AM

## 2021-07-25 NOTE — Progress Notes (Signed)
Occupational Therapy Session Note  Patient Details  Name: Jared Bridges MRN: [id number] Date of Birth: 08/20/1930  Today's Date: 07/25/2021 OT Individual Time: 0930-1040 OT Individual Time Calculation (min): 70 min    Short Term Goals: Week 3:  OT Short Term Goal 1 (Week 3): STG=LTG 2/2 ELOS (continuing working towards min A overall)  Skilled Therapeutic Interventions/Progress Updates:    Pt resting in bed upon arrival and agreeable to getting OOB to change clothing. Pt requested to use urinal sitting EOB. Supine>sit EOB with min A. Sitting balance EOB with supervision while holding urinal. Sit<>stand from EOB with min A using PFRW. Pt threaded BLE into pants but required assistance pulling over hips. Pt threaded BUE into sleeves and pulled over head but required assistance pulling over trunk and under cervical collar. Pt transitioned to day room for BUE table tasks. 9 hole peg test: Rt-50", Lt-1'27". Pt used therapuuty to remove and replace small pegs. Pt practiced picking up coins of various sizes. Pt is WNL for in hand manipulation of small coins in RUE/hand. Pt returned to room and reamined in w/c with all needs withiin reach. Belt alarm activated.   Therapy Documentation Precautions:  Precautions Precautions: Cervical, Fall Required Braces or Orthoses: Cervical Brace Cervical Brace: Hard collar, At all times Restrictions Weight Bearing Restrictions: No   Pain:  Pt denies pain this morning   Therapy/Group: Individual Therapy  [name] 07/25/2021, 12:20 PM

## 2021-07-25 NOTE — Progress Notes (Addendum)
Patient ID: Jared Bridges, male   DOB: [birth year], 87 y.o.   MRN: [id number]  SW ordered bilateral platform attachments for pt RW with [employer] via parachute.   SW updated pt and family on above. Dtr [name] reports [place] care will come to visit pt when he discharges to discuss providing aide care to them. No questions/concerns reported.   [name], MSW, [place] Office: [phone] Cell: [phone] Fax: [phone]

## 2021-07-25 NOTE — Progress Notes (Signed)
Physical Therapy Discharge Summary  Patient Details  Name: Jared Bridges MRN: [id number] Date of Birth: [birth year]  Today's Date: 07/25/2021 PT Individual Time: 1315-1415 PT Individual Time Calculation (min): 60 min   Skilled Therapeutic Interventions/Progress Updates:  Patient seen in room with wife and daughter present with goal of session for daughter to feel comfortable with transfers and bed mobility using Stedy lift.  Patient in w/c wearing c-collar.  Demonstrated Stedy transfer with pt using lift with pt needing min A from tilt in space w/c to stand to device and used seat to transport to EOB.  [name], pt's daughter able to return demonstrate Stedy transfer bed to standard w/c which is what pt has at home.  Refreshed [name] on use of legrests and need for set up of w/c prior to transfer.  She performed w/c to bed with cues for positioning as pt with more difficulty standing from height of standard w/c.  Patient to supine with [name] assisting legs into bed.  Demonstrated changing out pads for c-collar and [name] able to verbalize safe method for cleaning.  Discussed not going home with our AFO's but will need orthotist consult down the road and wrote down for her the type we used on each leg and why.  Patient to sitting with min A for trunk and [name] observing how to assist with log roll technique for spinal precautions.  Patient on EOB reporting neck pain and declining ambulation.  RN made aware pt needing medication.  Discussed with [name] the platforms should be delivered prior to d/c for fitting on walker at home by HHPT.  Patient sit to stand to PFRW min A from elevated bed height and side steps to [facility] with min A.  Patient to supine with mod A for LE's.  Patient left with call bell and needs in reach, family in the room and bed alarm active.    Patient has met 1 of 10 long term goals due to improved postural control and ability to compensate for deficits.  Patient to discharge at a wheelchair  level [place].   Patient's care partner is independent to provide the necessary physical assistance at discharge.  Reasons goals not met: Patient with ongoing limited activity tolerance with cervical pain and UE>LE weakness limiting mobility, overall mod A for transfers and ambulation with +2 for chair follow for safety.  Patient's daughter, [name], purchasing Stedy for home for transfers and demonstrated safe transfers during therapy with this device.  Also hiring caregivers for home with ambulance transport planned with patient planning on continuing HHPT as pt/family declined SNF rehab.   Recommendation:  Patient will benefit from ongoing skilled PT services in home health setting to continue to advance safe functional mobility, address ongoing impairments in balance, strength, endurance, postural control, safety, and minimize fall risk.  Equipment: Bilateral platforms for RW  Reasons for discharge: lack of progress toward goals and discharge from hospital  Patient/family agrees with progress made and goals achieved: Yes  PT Discharge Precautions/Restrictions Precautions Precautions: Cervical;Fall Required Braces or Orthoses: Cervical Brace Cervical Brace: Hard collar;At all times  Pain Pain Assessment Pain Score: 6  Pain Type: Acute pain Pain Location: Neck Pain Orientation: Posterior Pain Descriptors / Indicators: Aching Pain Onset: With Activity Pain Intervention(s): RN made aware;Rest Pain Interference Pain Interference Pain Effect on Sleep: 1. Rarely or not at all Pain Interference with Therapy Activities: 2. Occasionally Pain Interference with Day-to-Day Activities: 2. Occasionally Vision/Perception  Vision - History Ability to See in Adequate  Light: 0 Adequate Perception Perception: Within Functional Limits Praxis Praxis: Intact  Cognition Overall Cognitive Status: Within Functional Limits for tasks assessed Arousal/Alertness: Awake/alert Orientation Level:  Oriented X4 Safety/Judgment: Appears intact Sensation Sensation Light Touch: Impaired by gross assessment Peripheral sensation comments: peripheral neuropathy Light Touch Impaired Details: Impaired RLE;Impaired LLE Hot/Cold: Not tested Proprioception: Not tested Stereognosis: Not tested Coordination Gross Motor Movements are Fluid and Coordinated: No Fine Motor Movements are Fluid and Coordinated: No Coordination and Movement Description: decreased UE/LE strength limiting coordination Motor  Motor Motor: Abnormal postural alignment and control;Other (comment) Motor - Discharge Observations: UE>LE weakness from central cord injury  Mobility Bed Mobility Bed Mobility: Rolling Right;Right Sidelying to Sit Rolling Right: Contact Guard/Touching assist Right Sidelying to Sit: Minimal Assistance - Patient > 75% Sit to Supine: Moderate Assistance - Patient 50-74% Transfers Sit to Stand: Moderate Assistance - Patient 50-74% Stand to Sit: Minimal Assistance - Patient > 75% Stand Pivot Transfers: Moderate Assistance - Patient 50 - 74% Stand Pivot Transfer Details: Verbal cues for sequencing;Visual cues for safe use of DME/AE;Verbal cues for technique;Verbal cues for safe use of DME/AE Transfer (Assistive device): Bilateral platform walker Locomotion  Gait Ambulation: Yes Gait Assistance: 2 Helpers Gait Distance (Feet): 60 Feet Assistive device: Bilateral platform walker;Other (Comment) (bilat AFO's) Gait Assistance Details: Verbal cues for precautions/safety;Verbal cues for gait pattern Gait Assistance Details: cues for increased BOS, less scissoring Gait Gait: Yes Gait Pattern: Impaired Gait Pattern: Decreased stride length;Step-through pattern;Scissoring;Decreased dorsiflexion - right;Decreased dorsiflexion - left;Lateral hip instability;Lateral trunk lean to left;Trunk flexed;Narrow base of support Stairs / Additional Locomotion Stairs: No Pick up small object from the floor  assist level: Maximal Assistance - Patient 25 - 49% Wheelchair Mobility Wheelchair Mobility: Yes Wheelchair Assistance: Dependent - Patient 0% Wheelchair Parts Management: Needs assistance  Trunk/Postural Assessment  Cervical Assessment Cervical Assessment: Exceptions to [facility] (c-collar with forward head) Thoracic Assessment Thoracic Assessment: Exceptions to [facility] (rounded shoulders) Lumbar Assessment Lumbar Assessment: Exceptions to [facility] (posterior pelvic tilt) Postural Control Postural Control: Deficits on evaluation Righting Reactions: delayed for anterior LOB Postural Limitations: flexed cervical posture with c-collar  Balance Balance Balance Assessed: Yes Static Sitting Balance Static Sitting - Balance Support: Feet supported;No upper extremity supported Static Sitting - Level of Assistance: 5: Stand by assistance Dynamic Sitting Balance Dynamic Sitting - Balance Support: Feet supported;During functional activity Dynamic Sitting - Level of Assistance: 5: Stand by assistance Static Standing Balance Static Standing - Balance Support: Right upper extremity supported;Left upper extremity supported Static Standing - Level of Assistance: 4: Min assist Dynamic Standing Balance Dynamic Standing - Balance Support: Bilateral upper extremity supported Dynamic Standing - Level of Assistance: 4: Min assist Extremity Assessment      RLE Assessment General Strength Comments: Grossly 4/ 5 prox to dist LLE Assessment General Strength Comments: Grossly 3+ to 4-/ 5 prox to 4/ 5 distal    Jamison Oka, PT 07/25/2021, 6:18 PM

## 2021-07-25 NOTE — Plan of Care (Signed)
  Problem: RH Balance Goal: LTG Patient will maintain dynamic sitting balance (PT) Description: LTG:  Patient will maintain dynamic sitting balance with assistance during mobility activities (PT) Outcome: Not Met (add Reason) Note: S at least for dynamic sitting activities due to slow balance reactions Goal: LTG Patient will maintain dynamic standing balance (PT) Description: LTG:  Patient will maintain dynamic standing balance with assistance during mobility activities (PT) Outcome: Completed/Met   Problem: Sit to Stand Goal: LTG:  Patient will perform sit to stand with assistance level (PT) Description: LTG:  Patient will perform sit to stand with assistance level (PT) Outcome: Not Met (add Reason)   Problem: RH Bed Mobility Goal: LTG Patient will perform bed mobility with assist (PT) Description: LTG: Patient will perform bed mobility with assistance, with/without cues (PT). Outcome: Not Met (add Reason)   Problem: RH Bed to Chair Transfers Goal: LTG Patient will perform bed/chair transfers w/assist (PT) Description: LTG: Patient will perform bed to chair transfers with assistance (PT). Outcome: Not Met (add Reason)   Problem: RH Car Transfers Goal: LTG Patient will perform car transfers with assist (PT) Description: LTG: Patient will perform car transfers with assistance (PT). Outcome: Not Met (add Reason) Note: Mod A needed due to continued UE/LE weakness and pain   Problem: RH Furniture Transfers Goal: LTG Patient will perform furniture transfers w/assist (OT/PT) Description: LTG: Patient will perform furniture transfers  with assistance (OT/PT). Outcome: Not Met (add Reason) Note: Mod A due to continued UE/LE weakness and pain   Problem: RH Ambulation Goal: LTG Patient will ambulate in controlled environment (PT) Description: LTG: Patient will ambulate in a controlled environment, # of feet with assistance (PT). Outcome: Not Met (add Reason) Note: Mod A for ambulation  due to continued UE/LE weakness, pain Goal: LTG Patient will ambulate in home environment (PT) Description: LTG: Patient will ambulate in home environment, # of feet with assistance (PT). Outcome: Not Met (add Reason) Note: Not met due to slow progress, pain and continued UE/LE weakness   Problem: RH Stairs Goal: LTG Patient will ambulate up and down stairs w/assist (PT) Description: LTG: Patient will ambulate up and down # of stairs with assistance (PT) Outcome: Not Met (add Reason) Note: Max A for 1 step with LE buckling/weakness  Magda Kiel, PT

## 2021-07-25 NOTE — Progress Notes (Signed)
Slept well throughout the night. PRN oxycodone given for neck pain-effective.

## 2021-07-26 LAB — GLUCOSE, CAPILLARY: Glucose-Capillary: 120 mg/dL — ABNORMAL HIGH (ref 70–99)

## 2021-07-26 NOTE — Discharge Summary (Signed)
Physician Discharge Summary  Patient ID: Jared Bridges MRN: [id number] DOB/AGE: 85-08-1930 85 y.o.  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 There is no evidence of focal airspace disease, pulmonary edema, suspicious pulmonary nodule/mass, pleural effusion, or pneumothorax. No acute bony abnormalities are identified. IMPRESSION: No active cardiopulmonary disease. Electronically Signed   By: Margarette Canada M.D.   On: 07/06/2021 08:55    Labs:  Basic Metabolic Panel: BMP Latest Ref Rng & Units 07/22/2021 07/15/2021 07/08/2021  Glucose 70 - 99 mg/dL 112(H) 167(H) 238(H)  BUN 8 - 23 mg/dL 21 23 28(H)  Creatinine 0.61 - 1.24 mg/dL 0.99 1.14 1.08  BUN/Creat Ratio 6 - 22 (calc) - - -  Sodium 135 - 145 mmol/L 138 138 134(L)  Potassium 3.5 - 5.1 mmol/L 3.4(L) 3.8 3.7  Chloride 98 - 111 mmol/L 109 106 104  CO2 22 - 32 mmol/L '24 25 24  '$ Calcium 8.9 - 10.3 mg/dL 8.2(L) 9.1 7.7(L)     CBC: CBC Latest Ref Rng & Units 07/22/2021 07/15/2021 07/08/2021  WBC 4.0 - 10.5 K/uL 5.7 8.0 8.1  Hemoglobin 13.0 - 17.0 g/dL 10.8(L) 11.1(L) 9.3(L)  Hematocrit 39.0 - 52.0 % 34.0(L) 34.6(L) 29.1(L)  Platelets 150 - 400 K/uL 230 265 188     CBG: Recent Labs  Lab 07/25/21 0605 07/25/21 1125 07/25/21 1623 07/25/21 2121 07/26/21 0546  GLUCAP 118* 119* 128* 146* 120*    Brief HPI:   Jared Bridges is a 87 y.o. male with history of T2DM, HTN, PMR,  peripheral neuropathy, bilateral knee instability with falls, right bimalleolar ankle fracture January 22, breast cancer with mets who was admitted on 07/01/2021 after a fall with numbness in BUE and LLE knee weakness.  CT neck showed cervical ankylosis with nondisplaced fracture through anterior C6/C7 vertebral osteophyte and left occipital scalp contusion he was transferred to [facility] for management and MRI brain/spine done revealing dorsally epidural hematoma extending from C5-T1 with severe diffuse narrowing of thecal sac mildly compressing spinal cord and focal tear of ligamentous  flavum at C6/C7 and diffuse ankylosis with fracture vertebral osteophyte at C6/7.  Dr. Christella Noa recommended c-collar for support as well as conservative management.  Patient continued to have limitations in ADL and mobility due to numbness of BUE with tremors, neck pain as well as BLE weakness.  CIR was recommended due to functional decline.   Hospital Course: Jared Bridges was admitted to rehab 07/05/2021 for inpatient therapies to consist of PT and OT at least three hours five days a week. Past admission physiatrist, therapy team and rehab RN have worked together to provide customized collaborative inpatient rehab.  SCDs were used for DVT prophylaxis.  Pain was controlled with as needed use of oxycodone.  His blood pressures were monitored on 3 times daily basis and were relatively controlled.  His diabetes has been monitored with ac/hs CBG checks and SSI was use prn for tighter BS control.  Diet was changed to carb modified  and metformin was added for blood sugar control.  [place] was changed out to [employer] collar to help with neck comfort.  Patient was noted to have wet voice and he was instructed on use of flutter valve.  He has had issues with urinary incontinence and was started on tolerating program and PVR showed the patient was voiding without retention but continued to be incontinent.  Ambien was added to help  manage sleep-wake disruption with improvement in sleep pattern.  Follow-up labs showed hypokalemia which was treated with brief supplementation.  Follow-up CBC shows H&H to be relatively stable.  Patient's progress has been slow due to limited activity tolerance as well as pain and weakness.  SNF was discussed however patient declined this.  Family elected to hire caregivers for assistance.  He will continue to receive follow up HHPT, [place] and HHNA by [place] after discharge.   Rehab course: During patient's stay in rehab weekly team conferences were held to monitor patient's progress, set goals and discuss barriers to discharge. At admission, patient required min to max assist with basic ADL task an mod assist with mobility. He  has had improvement in activity tolerance, balance, postural control as well as ability to compensate for deficits.  He requires mod assist for transfers and is able to ambulate with plus to assist and bilateral platform walker for about 60 feet.  Family education has been completed with daughter regarding all aspects of care as well as use of STEDY lift.  Disposition: Home  Diet: Carb Modified.   Special Instructions: Wear collar at all times.    Allergies as of 07/26/2021       Reactions   Iodine Hives, Itching, Other (See Comments)   very allergic(per daughter), can pre-med with benadryl   Calan [verapamil] Other (See Comments)   Weakness   Cymbalta [duloxetine Hcl] Other (See Comments)   Makes patient have jerking motions.   Iodinated Diagnostic Agents Hives, Itching, Other (See Comments)   Can pre-med with benadryl   Latex Other (See Comments)   Redness iritation   Lisinopril Other (See Comments)   Makes patient have jerking motions.   Neurontin [gabapentin] Other (See Comments)   Makes patient have jerking motions.   Zoloft [sertraline Hcl] Diarrhea, Other (See Comments)   Makes patient have jerking motions.   Valium [diazepam] Other (See  Comments)   "Did not work"   Herbalist Hcl] Other (See Comments)   Dizziness   Glimepiride Other (See Comments)   "Did not work"   Melatonin Nausea Only   Tape Other (See Comments)   Skin irritation        Medication List     STOP taking these medications    Ciclopirox 0.77 % gel   escitalopram 20 MG tablet Commonly known as: LEXAPRO   midodrine 10 MG tablet Commonly known as: PROAMATINE   Myrbetriq 50 MG Tb24 tablet Generic drug: mirabegron ER       TAKE these medications    acetaminophen 325 MG tablet Commonly known as: TYLENOL Take 2 tablets (650 mg total) by mouth 3 (three) times daily. What changed:  medication strength how much to take when to take this reasons to take this   alfuzosin 10 MG 24 hr tablet Commonly known as: UROXATRAL Take 10 mg by mouth every evening.   atorvastatin 40 MG tablet Commonly known as: LIPITOR Take 1 tablet (40 mg total) by mouth daily.   diclofenac sodium 1 % Gel Commonly known  as: VOLTAREN Apply 2 g topically daily as needed (for pain).   enzalutamide 80 MG tablet Commonly known as: XTANDI Take 80 mg by mouth daily.   finasteride 5 MG tablet Commonly known as: PROSCAR Take 1 tablet (5 mg total) by mouth daily.   fluocinonide cream 0.05 % Commonly known as: LIDEX Apply 1 application topically daily as needed (for irritation).   metFORMIN 500 MG tablet Commonly known as: GLUCOPHAGE Take 0.5 tablets (250 mg total) by mouth 2 (two) times daily with a meal.   oxyCODONE 5 MG immediate release tablet--Rx# 28 pills. Commonly known as: Oxy IR/ROXICODONE Take 1 tablet (5 mg total) by mouth every 6 (six) hours as needed for severe pain.   pantoprazole 20 MG tablet Commonly known as: PROTONIX TAKE 1 TABLET BY MOUTH DAILY   pregabalin 75 MG capsule Commonly known as: LYRICA Take 75 mg by mouth 2 (two) times daily.   senna-docusate 8.6-50 MG tablet Commonly known as: Senokot-S Take 1 tablet by mouth 2  (two) times daily.   topiramate 50 MG tablet Commonly known as: TOPAMAX Take 50 mg by mouth 2 (two) times daily.   venlafaxine XR 37.5 MG 24 hr capsule Commonly known as: EFFEXOR-XR Take 1 capsule (37.5 mg total) by mouth every morning.   vitamin B-12 500 MCG tablet Commonly known as: CYANOCOBALAMIN Take 1,000 mcg by mouth daily.   Vitamin D3 125 MCG (5000 UT) Caps Take 5,000 Units by mouth at bedtime.   zolpidem 5 MG tablet Commonly known as: AMBIEN Take 1 tablet (5 mg total) by mouth at bedtime.        Follow-up Information     Lovorn, Jinny Blossom, MD Follow up.   Specialty: Physical Medicine and Rehabilitation Why: office will call you with follow up appt Contact information: [address] [place] [address] [phone]         Ashok Pall, MD. Call.   Specialty: Neurosurgery Why: for follow up/neck brace Contact information: [address] [place] [address] [phone]         Juluis Pitch, MD Follow up.   Specialty: Family Medicine Why: Keep follow up appt Contact information: [id number] [name] [address] [phone]                 Signed: Bary Leriche 07/26/2021, 8:59 AM

## 2021-07-26 NOTE — Progress Notes (Signed)
Patient's discharge medications reviewed. No changes / interventions needed.  Thank you. Anette Guarneri, PharmD

## 2021-07-26 NOTE — Progress Notes (Signed)
Patient ID: Jared Bridges, male   DOB: [birth year], 87 y.o.   MRN: [id number]  Bedside table ordered per daughters request. [employer] will deliver to pt's home  [place], [place]

## 2021-07-29 DIAGNOSIS — W19XXXD Unspecified fall, subsequent encounter: Secondary | ICD-10-CM | POA: Diagnosis not present

## 2021-07-29 DIAGNOSIS — C7951 Secondary malignant neoplasm of bone: Secondary | ICD-10-CM | POA: Diagnosis not present

## 2021-07-29 DIAGNOSIS — Z8673 Personal history of transient ischemic attack (TIA), and cerebral infarction without residual deficits: Secondary | ICD-10-CM | POA: Diagnosis not present

## 2021-07-29 DIAGNOSIS — E7800 Pure hypercholesterolemia, unspecified: Secondary | ICD-10-CM | POA: Diagnosis not present

## 2021-07-29 DIAGNOSIS — K5900 Constipation, unspecified: Secondary | ICD-10-CM | POA: Diagnosis not present

## 2021-07-29 DIAGNOSIS — G8254 Quadriplegia, C5-C7 incomplete: Secondary | ICD-10-CM | POA: Diagnosis not present

## 2021-07-29 DIAGNOSIS — E876 Hypokalemia: Secondary | ICD-10-CM | POA: Diagnosis not present

## 2021-07-29 DIAGNOSIS — E1142 Type 2 diabetes mellitus with diabetic polyneuropathy: Secondary | ICD-10-CM | POA: Diagnosis not present

## 2021-07-29 DIAGNOSIS — M353 Polymyalgia rheumatica: Secondary | ICD-10-CM | POA: Diagnosis not present

## 2021-07-29 DIAGNOSIS — Z79891 Long term (current) use of opiate analgesic: Secondary | ICD-10-CM | POA: Diagnosis not present

## 2021-07-29 DIAGNOSIS — Z7984 Long term (current) use of oral hypoglycemic drugs: Secondary | ICD-10-CM | POA: Diagnosis not present

## 2021-07-29 DIAGNOSIS — I110 Hypertensive heart disease with heart failure: Secondary | ICD-10-CM | POA: Diagnosis not present

## 2021-07-29 DIAGNOSIS — G479 Sleep disorder, unspecified: Secondary | ICD-10-CM | POA: Diagnosis not present

## 2021-07-29 DIAGNOSIS — S14127D Central cord syndrome at C7 level of cervical spinal cord, subsequent encounter: Secondary | ICD-10-CM | POA: Diagnosis not present

## 2021-07-29 DIAGNOSIS — I951 Orthostatic hypotension: Secondary | ICD-10-CM | POA: Diagnosis not present

## 2021-07-29 DIAGNOSIS — S14106S Unspecified injury at C6 level of cervical spinal cord, sequela: Secondary | ICD-10-CM | POA: Diagnosis not present

## 2021-07-29 DIAGNOSIS — E1165 Type 2 diabetes mellitus with hyperglycemia: Secondary | ICD-10-CM | POA: Diagnosis not present

## 2021-07-29 DIAGNOSIS — I5032 Chronic diastolic (congestive) heart failure: Secondary | ICD-10-CM | POA: Diagnosis not present

## 2021-07-29 DIAGNOSIS — F32A Depression, unspecified: Secondary | ICD-10-CM | POA: Diagnosis not present

## 2021-07-29 DIAGNOSIS — Z9181 History of falling: Secondary | ICD-10-CM | POA: Diagnosis not present

## 2021-07-29 DIAGNOSIS — Z87891 Personal history of nicotine dependence: Secondary | ICD-10-CM | POA: Diagnosis not present

## 2021-07-29 DIAGNOSIS — S064X0D Epidural hemorrhage without loss of consciousness, subsequent encounter: Secondary | ICD-10-CM | POA: Diagnosis not present

## 2021-07-30 ENCOUNTER — Telehealth: Payer: Self-pay | Admitting: Physical Medicine and Rehabilitation

## 2021-07-30 DIAGNOSIS — M353 Polymyalgia rheumatica: Secondary | ICD-10-CM | POA: Diagnosis not present

## 2021-07-30 DIAGNOSIS — Z8673 Personal history of transient ischemic attack (TIA), and cerebral infarction without residual deficits: Secondary | ICD-10-CM | POA: Diagnosis not present

## 2021-07-30 DIAGNOSIS — Z87891 Personal history of nicotine dependence: Secondary | ICD-10-CM | POA: Diagnosis not present

## 2021-07-30 DIAGNOSIS — F32A Depression, unspecified: Secondary | ICD-10-CM | POA: Diagnosis not present

## 2021-07-30 DIAGNOSIS — C7951 Secondary malignant neoplasm of bone: Secondary | ICD-10-CM | POA: Diagnosis not present

## 2021-07-30 DIAGNOSIS — E1142 Type 2 diabetes mellitus with diabetic polyneuropathy: Secondary | ICD-10-CM | POA: Diagnosis not present

## 2021-07-30 DIAGNOSIS — W19XXXD Unspecified fall, subsequent encounter: Secondary | ICD-10-CM | POA: Diagnosis not present

## 2021-07-30 DIAGNOSIS — G8254 Quadriplegia, C5-C7 incomplete: Secondary | ICD-10-CM | POA: Diagnosis not present

## 2021-07-30 DIAGNOSIS — Z7984 Long term (current) use of oral hypoglycemic drugs: Secondary | ICD-10-CM | POA: Diagnosis not present

## 2021-07-30 DIAGNOSIS — E876 Hypokalemia: Secondary | ICD-10-CM | POA: Diagnosis not present

## 2021-07-30 DIAGNOSIS — G479 Sleep disorder, unspecified: Secondary | ICD-10-CM | POA: Diagnosis not present

## 2021-07-30 DIAGNOSIS — E1165 Type 2 diabetes mellitus with hyperglycemia: Secondary | ICD-10-CM | POA: Diagnosis not present

## 2021-07-30 DIAGNOSIS — S14127D Central cord syndrome at C7 level of cervical spinal cord, subsequent encounter: Secondary | ICD-10-CM | POA: Diagnosis not present

## 2021-07-30 DIAGNOSIS — S064X0D Epidural hemorrhage without loss of consciousness, subsequent encounter: Secondary | ICD-10-CM | POA: Diagnosis not present

## 2021-07-30 DIAGNOSIS — S14106S Unspecified injury at C6 level of cervical spinal cord, sequela: Secondary | ICD-10-CM | POA: Diagnosis not present

## 2021-07-30 DIAGNOSIS — I5032 Chronic diastolic (congestive) heart failure: Secondary | ICD-10-CM | POA: Diagnosis not present

## 2021-07-30 DIAGNOSIS — K5900 Constipation, unspecified: Secondary | ICD-10-CM | POA: Diagnosis not present

## 2021-07-30 DIAGNOSIS — Z9181 History of falling: Secondary | ICD-10-CM | POA: Diagnosis not present

## 2021-07-30 DIAGNOSIS — Z79891 Long term (current) use of opiate analgesic: Secondary | ICD-10-CM | POA: Diagnosis not present

## 2021-07-30 DIAGNOSIS — E7800 Pure hypercholesterolemia, unspecified: Secondary | ICD-10-CM | POA: Diagnosis not present

## 2021-07-30 DIAGNOSIS — I951 Orthostatic hypotension: Secondary | ICD-10-CM | POA: Diagnosis not present

## 2021-07-30 DIAGNOSIS — I110 Hypertensive heart disease with heart failure: Secondary | ICD-10-CM | POA: Diagnosis not present

## 2021-07-30 NOTE — Telephone Encounter (Signed)
Home Health Called and patient refuses to wear neck brace state it makes him hurt worse, They are not sure how critical it is that he keeps it on.[name] Kitchen

## 2021-07-31 DIAGNOSIS — Z87891 Personal history of nicotine dependence: Secondary | ICD-10-CM | POA: Diagnosis not present

## 2021-07-31 DIAGNOSIS — S14106S Unspecified injury at C6 level of cervical spinal cord, sequela: Secondary | ICD-10-CM | POA: Diagnosis not present

## 2021-07-31 DIAGNOSIS — E7800 Pure hypercholesterolemia, unspecified: Secondary | ICD-10-CM | POA: Diagnosis not present

## 2021-07-31 DIAGNOSIS — W19XXXD Unspecified fall, subsequent encounter: Secondary | ICD-10-CM | POA: Diagnosis not present

## 2021-07-31 DIAGNOSIS — E876 Hypokalemia: Secondary | ICD-10-CM | POA: Diagnosis not present

## 2021-07-31 DIAGNOSIS — G479 Sleep disorder, unspecified: Secondary | ICD-10-CM | POA: Diagnosis not present

## 2021-07-31 DIAGNOSIS — S064X0D Epidural hemorrhage without loss of consciousness, subsequent encounter: Secondary | ICD-10-CM | POA: Diagnosis not present

## 2021-07-31 DIAGNOSIS — Z79891 Long term (current) use of opiate analgesic: Secondary | ICD-10-CM | POA: Diagnosis not present

## 2021-07-31 DIAGNOSIS — F32A Depression, unspecified: Secondary | ICD-10-CM | POA: Diagnosis not present

## 2021-07-31 DIAGNOSIS — Z8673 Personal history of transient ischemic attack (TIA), and cerebral infarction without residual deficits: Secondary | ICD-10-CM | POA: Diagnosis not present

## 2021-07-31 DIAGNOSIS — Z9181 History of falling: Secondary | ICD-10-CM | POA: Diagnosis not present

## 2021-07-31 DIAGNOSIS — E1142 Type 2 diabetes mellitus with diabetic polyneuropathy: Secondary | ICD-10-CM | POA: Diagnosis not present

## 2021-07-31 DIAGNOSIS — I110 Hypertensive heart disease with heart failure: Secondary | ICD-10-CM | POA: Diagnosis not present

## 2021-07-31 DIAGNOSIS — I951 Orthostatic hypotension: Secondary | ICD-10-CM | POA: Diagnosis not present

## 2021-07-31 DIAGNOSIS — I5032 Chronic diastolic (congestive) heart failure: Secondary | ICD-10-CM | POA: Diagnosis not present

## 2021-07-31 DIAGNOSIS — M353 Polymyalgia rheumatica: Secondary | ICD-10-CM | POA: Diagnosis not present

## 2021-07-31 DIAGNOSIS — S14127D Central cord syndrome at C7 level of cervical spinal cord, subsequent encounter: Secondary | ICD-10-CM | POA: Diagnosis not present

## 2021-07-31 DIAGNOSIS — K5900 Constipation, unspecified: Secondary | ICD-10-CM | POA: Diagnosis not present

## 2021-07-31 DIAGNOSIS — C7951 Secondary malignant neoplasm of bone: Secondary | ICD-10-CM | POA: Diagnosis not present

## 2021-07-31 DIAGNOSIS — Z7984 Long term (current) use of oral hypoglycemic drugs: Secondary | ICD-10-CM | POA: Diagnosis not present

## 2021-07-31 DIAGNOSIS — G8254 Quadriplegia, C5-C7 incomplete: Secondary | ICD-10-CM | POA: Diagnosis not present

## 2021-07-31 DIAGNOSIS — E1165 Type 2 diabetes mellitus with hyperglycemia: Secondary | ICD-10-CM | POA: Diagnosis not present

## 2021-08-01 NOTE — Telephone Encounter (Signed)
Called pt and daughter and explained he HAS to wear collar 100% of time, until sees surgeon and they decide when he can take it off- if he disagrees, call Neurosurgery.   Let him and daughter know.

## 2021-08-12 ENCOUNTER — Other Ambulatory Visit (INDEPENDENT_AMBULATORY_CARE_PROVIDER_SITE_OTHER): Payer: Self-pay | Admitting: Internal Medicine

## 2021-09-02 ENCOUNTER — Encounter: Payer: Self-pay | Admitting: Physical Medicine and Rehabilitation

## 2021-09-02 ENCOUNTER — Other Ambulatory Visit: Payer: Self-pay

## 2021-09-02 ENCOUNTER — Encounter
Payer: Medicare Other | Attending: Physical Medicine and Rehabilitation | Admitting: Physical Medicine and Rehabilitation

## 2021-09-02 VITALS — Ht 74.0 in

## 2021-09-02 DIAGNOSIS — S14127D Central cord syndrome at C7 level of cervical spinal cord, subsequent encounter: Secondary | ICD-10-CM | POA: Diagnosis not present

## 2021-09-02 DIAGNOSIS — Z993 Dependence on wheelchair: Secondary | ICD-10-CM

## 2021-09-02 DIAGNOSIS — G8250 Quadriplegia, unspecified: Secondary | ICD-10-CM

## 2021-09-02 DIAGNOSIS — N39490 Overflow incontinence: Secondary | ICD-10-CM

## 2021-09-02 NOTE — Progress Notes (Signed)
Subjective:    Patient ID: Jared Bridges, male    DOB: 1930/03/06, 87 y.o.   MRN: [id number]  Due to national recommendations of social distancing because of COVID 17, an audio/video tele-health visit is felt to be the most appropriate encounter for this patient at this time. See MyChart message from today for the patient's consent to a tele-health encounter with [facility]. This is a follow up tele-visit via Webex. The patient is at home. MD is at office.  HPI    Pt is an 87 yr old male wwith hx of DM, HTN, PMR with peripheral neuropathy and prostate cancer with bony mets on Xandi; with neurogenic bowel and bladder  Functional and mobility deficits secondary to fall with C6/7 fx and Incomplete quadriplegia-  and subsequent cervical cord compression at that level. He is a C7 Central cord, left side more affected than right. No surgery was done- was placed in cervical collar.  Pt is here for f/u for SCI- on webex.  Pt's daughter reports using Cera Steady to transfer him from bed to bathroom to w/c.  Is incontinent of bladder and sometimes bowels, but going daily for BM's. Required Miralax a few times in last 2 weeks, but no other intervention to go.   PT stopped coming because had episode where his knee "gave out" and didn't have a w/c follow- so they said "they'd wait".   Stuck in house- doesn't have ramp. EMS won't come get him since he "has a SCI".   Can take a few steps with a private duty nurse. And stands with steady.   Pain is doing well- has 7-8 Oxycodone left, but not using them- pain controlled without other meds- takes tylenol prn.    Denies any spasms/spasticity Sx's.  Also having a productive cough- had gotten better before left inpt rehab, but has come back- not using ICS or flutter valve        Pain Inventory Average Pain 3 Pain Right Now 2 My pain is intermittent and sharp  LOCATION OF PAIN  NECK  BOWEL Number of stools  per week: 4-5 Oral laxative use No  Type of laxative Miralax Enema or suppository use No  History of colostomy No  Incontinent No   BLADDER Pads In and out cath, frequency N/a Able to self cath No  Bladder incontinence Yes  Frequent urination No  Leakage with coughing No  Difficulty starting stream No  Incomplete bladder emptying No    Mobility ability to climb steps?  no do you drive?  no use a wheelchair needs help with transfers Do you have any goals in this area?  yes  Function retired I need assistance with the following:  dressing, bathing, toileting, meal prep, household duties, and shopping Do you have any goals in this area?  yes  Neuro/Psych bladder control problems weakness trouble walking depression  Prior Studies Any changes since last visit?  no  Physicians involved in your care Any changes since last visit?  yes, Juluis Pitch PCP at [facility] History  Problem Relation Age of Onset   Colon cancer Sister    Hypertension Other    Depression Brother    Depression Daughter    Social History   Socioeconomic History   Marital status: Married    Spouse name: Not on file   Number of children: Not on file   Years of education: Not on file   Highest education level: Not  on file  Occupational History   Not on file  Tobacco Use   Smoking status: Former    Packs/day: 0.10    Years: 18.00    Pack years: 1.80    Types: Cigarettes    Start date: 12/18/1958    Quit date: 12/01/1986    Years since quitting: 34.7   Smokeless tobacco: Never  Vaping Use   Vaping Use: Never used  Substance and Sexual Activity   Alcohol use: No    Alcohol/week: 0.0 standard drinks   Drug use: No   Sexual activity: Never  Other Topics Concern   Not on file  Social History Narrative   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   Social Determinants of Health   Financial Resource Strain: Not on file  Food Insecurity: Not on file  Transportation  Needs: Not on file  Physical Activity: Not on file  Stress: Not on file  Social Connections: Not on file   Past Surgical History:  Procedure Laterality Date   APPENDECTOMY     as child   BACK SURGERY     2000 IDET SPINAL PROC   CHOLECYSTECTOMY N/A 04/07/2016   Procedure: CHOLECYSTECTOMY;  Surgeon: Aviva Signs, MD;  Location: [facility];  Service: General;  Laterality: N/A;   [place]   COLONOSCOPY N/A 01/07/2018   Procedure: COLONOSCOPY;  Surgeon: Rogene Houston, MD;  Location: [facility];  Service: Endoscopy;  Laterality: N/A;  1:00   CRYOTHERAPY     EYE SURGERY     right cataract with lens implant   FEMUR SURGERY     RT LEG        1953   HAND SURGERY     RIGHT   HERNIA REPAIR     1-right inguinal, 3- left inguinal   IRRIGATION AND DEBRIDEMENT KNEE Left 07/09/2015   Procedure: LEFT KNEE IRRIGATION AND DEBRIDEMENT WOUND CLOSURE;  Surgeon: Gaynelle Arabian, MD;  Location: [facility];  Service: Orthopedics;  Laterality: Left;   JOINT REPLACEMENT  02/28/2016   lt knee revision   KNEE DEBRIDEMENT     2016   LEFT   MENISCUS REPAIR     LEFT   POLYPECTOMY  01/07/2018   Procedure: POLYPECTOMY;  Surgeon: Rogene Houston, MD;  Location[facility];  Service: Endoscopy;;  colon    SHOULDER OPEN ROTATOR CUFF REPAIR     RT SHOULDER   SPINAL FUSION     SPINAL FUSION     TONSILLECTOMY     TOTAL KNEE ARTHROPLASTY Left 05/28/2015   Procedure: LEFT TOTAL KNEE ARTHROPLASTY;  Surgeon: Gaynelle Arabian, MD;  Location: [facility];  Service: Orthopedics;  Laterality: Left;   TOTAL KNEE REVISION Left 02/28/2016   Procedure: LEFT TOTAL KNEE REVISION;  Surgeon: Leandrew Koyanagi, MD;  Location: [facility];  Service: Orthopedics;  Laterality: Left;   TRIGGER FINGER RELEASE     RT HAND   1990S   UVULECTOMY  2004   TURBINATE,TONSIL,ADENOID   Past Medical History:  Diagnosis Date   Anemia, iron deficiency    Chronic headaches    Migraines   Chronic kidney disease    Resolved    Depression    Diverticulitis    Essential hypertension    GI bleed    Dr. Laural Golden - 1998   Hypercholesteremia    Orthostatic hypotension    Peptic ulcer disease    Peripheral neuropathy    Polymyalgia rheumatica ([place])  Stroke [facility])    Residual trouble reading and writing   Syncope    Thyroid nodule    TIA (transient ischemic attack) 12/2017   Type 2 diabetes mellitus ([name]    Ht 6\' 2"  (1.88 m)   BMI 20.66 kg/m   Opioid Risk Score:   Fall Risk Score:  `1  Depression screen PHQ 2/9  Depression screen [facility] 2/9 01/03/2021 03/06/2020 12/07/2019  Decreased Interest 0 0 0  Down, Depressed, Hopeless 0 0 0  PHQ - 2 Score 0 0 0    Review of Systems  Constitutional:  Positive for fatigue.  Respiratory:  Positive for cough.   Genitourinary:        Incontinence   Musculoskeletal:  Positive for gait problem and neck pain.  Neurological:  Positive for weakness and headaches.  Psychiatric/Behavioral:         Depression  All other systems reviewed and are negative.     Objective:   Physical Exam  Seen in webex with daughter- in w/c; wearing cervicla collar; very HOH, NAD Has thick productive cough- can hear an occ audible wheeze rarely during visit.       Assessment & Plan:   Pt is an 87 yr old male wwith hx of DM, HTN, PMR with peripheral neuropathy and prostate cancer with bony mets on Xandi; with neurogenic bowel and bladder  Functional and mobility deficits secondary to fall with C6/7 fx and Incomplete quadriplegia-  and subsequent cervical cord compression at that level. He is a C7 Central cord, left side more affected than right. No surgery was done- was placed in cervical collar.  Pt is here for f/u for SCI- on webex. Last PSA 0.4 and on Xandi- from [facility].    Will refer to inpt rehab in [place] system- at [facility]- pt's Last 4 is #[id number]- FERNADO BRIGANTE, DOB 2029-12-10- he is a central cord C6 ASIA C/D based on last exam in hospital- cannot assess here on webex. Spoke  to daughter, [name]- [phone] is the daughter's contact info.   2. Will fax a copy of H&P, D/c summary, radiology MRI results as well as note from today to [place]- [phone] attention Raiford Simmonds- they have 57 people in front of him, so could be a few weeks to a couple of months to get in.   3. Needs to be put into [place] system- they will do so once they receive paperwork.   4. Suggest buying flutter valve off [address] [place]- they are hard to get once pt has left hospital via insurance- use q1 hour while awake- should help respiratory Sx's.   5. Until gets into [place], or hears if can get in, suggest waiting on more H/H PT and OT- however if cannot get in, for some reason, will need to order more H/H.    6. Call if needs any other pain meds-   7. Explained neurogenic bladder/incontinence [name] due to SCI- incomplete quadriplegia most likely- since didn't have prior to fall- suggest timed voiding- getting him to toilet every 2-3 hours if possible- will likely need to check PVR's again once in rehab- but wasn't retaining much when left inpt rehab.   8. Would benefit from bowel program, but daughter comfortable with current regimen.   9. F/U in 3 months- double appointment.    I spent a total of 32 minutes on total care- >50% on discussing [facility] and how to get in, B/B, and pain as well as transportation.

## 2021-09-02 NOTE — Patient Instructions (Signed)
Pt is an 87 yr old male wwith hx of DM, HTN, PMR with peripheral neuropathy and prostate cancer with bony mets on Xandi; with neurogenic bowel and bladder  Functional and mobility deficits secondary to fall with C6/7 fx and Incomplete quadriplegia-  and subsequent cervical cord compression at that level. He is a C7 Central cord, left side more affected than right. No surgery was done- was placed in cervical collar.  Pt is here for f/u for SCI- on webex. Last PSA 0.4 and on Xandi- from [facility].    Will refer to inpt rehab in [place] system- at [facility]- pt's Last 4 is #[id number]- Jared Bridges, DOB 09/11/2030- he is a central cord C6 ASIA C/D based on last exam in hospital- cannot assess here on webex. Spoke to daughter, [name]- [phone] is the daughter's contact info.   2. Will fax a copy of H&P, D/c summary, radiology MRI results as well as note from today to [place]- [phone] attention Raiford Simmonds- they have 75 people in front of him, so could be a few weeks to a couple of months to get in.   3. Needs to be put into [place] system- they will do so once they receive paperwork.   4. Suggest buying flutter valve off [place]- they are hard to get once pt has left hospital via insurance- use q1 hour while awake- should help respiratory Sx's.   5. Until gets into [place], or hears if can get in, suggest waiting on more H/H PT and OT- however if cannot get in, for some reason, will need to order more H/H.    6. Call if needs any other pain meds-   7. Explained neurogenic bladder/incontinence [name] due to SCI- incomplete quadriplegia most likely- since didn't have prior to fall- suggest timed voiding- getting him to toilet every 2-3 hours if possible- will likely need to check PVR's again once in rehab- but wasn't retaining much when left inpt rehab.   8. Would benefit from bowel program, but daughter comfortable with current regimen.   9. F/U in 3 months- double appointment.

## 2021-09-03 ENCOUNTER — Telehealth (INDEPENDENT_AMBULATORY_CARE_PROVIDER_SITE_OTHER): Payer: Medicare Other | Admitting: Internal Medicine

## 2021-09-03 ENCOUNTER — Other Ambulatory Visit: Payer: Self-pay

## 2021-09-03 ENCOUNTER — Encounter (INDEPENDENT_AMBULATORY_CARE_PROVIDER_SITE_OTHER): Payer: Self-pay | Admitting: Internal Medicine

## 2021-09-03 VITALS — Ht 74.0 in | Wt 210.0 lb

## 2021-09-03 DIAGNOSIS — Z8711 Personal history of peptic ulcer disease: Secondary | ICD-10-CM | POA: Diagnosis not present

## 2021-09-03 DIAGNOSIS — D126 Benign neoplasm of colon, unspecified: Secondary | ICD-10-CM

## 2021-09-03 MED ORDER — PANTOPRAZOLE SODIUM 20 MG PO TBEC: 20.0000 mg | DELAYED_RELEASE_TABLET | Freq: Every day | ORAL | 3 refills | Status: DC

## 2021-09-03 NOTE — Progress Notes (Signed)
Virtual Visit via Telephone Note  I connected with Jared Bridges on 09/03/21 at  4:31 PM EDT by telephone and verified that I am speaking with the correct person using two identifiers. Please note I attempted to do video visit but could not establish connection. Therefore proceeded to do virtual telephone visit. This visit was conducted with the help of patient's daughter [name].   Location: Patient: home Provider: office   I discussed the limitations, risks, security and privacy concerns of performing an evaluation and management service by telephone and the availability of in person appointments. I also discussed with the patient that there may be a patient responsible charge related to this service. The patient expressed understanding and agreed to proceed.  History of present illness.  Jared Bridges is 87 year old Caucasian male whom I last saw in February 2019 when he had colonoscopy with removal of 4 small polyps and these are tubular adenomas.  He also has remote history of peptic ulcer disease with bleeding and has been maintained on low-dose PPI. He also has history of metastatic prostate carcinoma and recent PSA was 0.04.  According to [name] he is doing well. He fell on 07/01/2021 and sustained fracture to C6 and C7 and he also suffered small epidural hematoma.  Medical therapy was recommended.  He was discharged and transferred to [facility] for rehab and he was finally able to come home on 07/26/2021. Appetite is good.  He rarely has heartburn.  His daughter has noted coughing while he is eating but he has not been recently treated for bronchitis or pneumonia.  He was evaluated by speech therapist during recent hospitalization.  Lately has bowels have been moving regularly he has bowel movement every day.  He is taking Peri-Colace on as-needed basis.  He remains with urinary incontinence.  His strength is improving.  He is able to take few steps on his own.  Left arm weakness has improved but not fully.   He is trying to take Tylenol on as-needed basis.  He is trying not to take Excedrin Migraine unless absolutely necessary.  His daughter feels that his headache is much better controlled since he has been on Topamax. He is not using topical agents for skin rash/psoriasis.   Current Outpatient Medications:    acetaminophen (TYLENOL) 325 MG tablet, Take 2 tablets (650 mg total) by mouth 3 (three) times daily., Disp: , Rfl:    alfuzosin (UROXATRAL) 10 MG 24 hr tablet, Take 10 mg by mouth every evening., Disp: , Rfl:    atorvastatin (LIPITOR) 40 MG tablet, Take 1 tablet (40 mg total) by mouth daily., Disp: 90 tablet, Rfl: 0   Cholecalciferol (VITAMIN D3) 5000 units CAPS, Take 5,000 Units by mouth at bedtime., Disp: , Rfl:    Ciclopirox 0.77 % gel, ciclopirox 0.77 % topical gel  APPLY TO THE AFFECTED AREA(S) TWICE DAILY, Disp: , Rfl:    diclofenac sodium (VOLTAREN) 1 % GEL, Apply 2 g topically daily as needed (for pain)., Disp: 2 g, Rfl: 1   enzalutamide (XTANDI) 80 MG tablet, Take 80 mg by mouth daily., Disp: , Rfl:    escitalopram (LEXAPRO) 20 MG tablet, Take 20 mg by mouth daily., Disp: , Rfl:    finasteride (PROSCAR) 5 MG tablet, Take 1 tablet (5 mg total) by mouth daily., Disp: 90 tablet, Rfl: 3   fluocinonide cream (LIDEX) 6.01 %, Apply 1 application topically daily as needed (for irritation)., Disp: , Rfl:    mirabegron ER (MYRBETRIQ) 50 MG TB24 tablet,  Myrbetriq 50 mg tablet,extended release  TAKE 1 TABLET BY MOUTH DAILY, Disp: , Rfl:    OVER THE COUNTER MEDICATION, Calcium 400mg  twice a day, Disp: , Rfl:    OVER THE COUNTER MEDICATION, Excedrin migraine as needed, Disp: , Rfl:    pregabalin (LYRICA) 75 MG capsule, Take 75 mg by mouth 2 (two) times daily., Disp: , Rfl:    senna-docusate (SENOKOT-S) 8.6-50 MG tablet, Take 1 tablet by mouth 2 (two) times daily., Disp: , Rfl:    topiramate (TOPAMAX) 50 MG tablet, Take 50 mg by mouth 2 (two) times daily., Disp: , Rfl:    venlafaxine XR  (EFFEXOR-XR) 37.5 MG 24 hr capsule, Take 1 capsule (37.5 mg total) by mouth every morning., Disp: 90 capsule, Rfl: 0   vitamin B-12 (CYANOCOBALAMIN) 500 MCG tablet, Take 1,000 mcg by mouth daily., Disp: , Rfl:    zolpidem (AMBIEN) 5 MG tablet, Take 1 tablet (5 mg total) by mouth at bedtime., Disp: 30 tablet, Rfl: 0   pantoprazole (PROTONIX) 20 MG tablet, Take 1 tablet (20 mg total) by mouth daily., Disp: 90 tablet, Rfl: 3     Observations/Objective:  Weight reported to be 210 pounds.  Assessment and Plan:  #1.  History of peptic ulcer disease with GI bleed about 17 years ago.  He remains on low-dose PPI.  He he is deemed to be high risk for recurrent peptic ulcer disease.  He should keep migraine Excedrin use to minimum because of aspirin contents.  #2.  History of colonic adenomas.  He had 4 adenomas removed in February 2019.  He is not due for colonoscopy for another year and a half.  It will depend on his overall condition whether or not he should undergo any more exams.  #3.  Coughing spells while eating.  Patient's daughter [name] would monitor the symptom and if it persists or gets worse would consider modified barium study and her reevaluation by [facility].   Follow Up Instructions:  New prescription for pantoprazole 20 mg p.o. every morning sent to patient's pharmacy for 90 doses with 3 refills. Office visit in 6 months.    I discussed the assessment and treatment plan with the patient. The patient was provided an opportunity to ask questions and all were answered. The patient agreed with the plan and demonstrated an understanding of the instructions.   The patient was advised to call back or seek an in-person evaluation if the symptoms worsen or if the condition fails to improve as anticipated.  I provided 24 minutes of non-face-to-face time during this encounter.   Hildred Laser, MD

## 2021-09-25 ENCOUNTER — Inpatient Hospital Stay: Payer: Medicare Other | Admitting: Physical Medicine and Rehabilitation

## 2021-10-09 ENCOUNTER — Other Ambulatory Visit: Payer: Self-pay | Admitting: Physician Assistant

## 2021-10-09 DIAGNOSIS — F3341 Major depressive disorder, recurrent, in partial remission: Secondary | ICD-10-CM

## 2021-10-11 ENCOUNTER — Inpatient Hospital Stay: Payer: Medicare Other | Admitting: Physical Medicine and Rehabilitation

## 2022-03-04 ENCOUNTER — Ambulatory Visit (INDEPENDENT_AMBULATORY_CARE_PROVIDER_SITE_OTHER): Payer: Medicare Other | Admitting: Internal Medicine

## 2022-03-04 ENCOUNTER — Encounter (INDEPENDENT_AMBULATORY_CARE_PROVIDER_SITE_OTHER): Payer: Self-pay | Admitting: Internal Medicine

## 2022-03-04 VITALS — Wt 181.0 lb

## 2022-03-04 DIAGNOSIS — R634 Abnormal weight loss: Secondary | ICD-10-CM | POA: Diagnosis not present

## 2022-03-04 DIAGNOSIS — Z8711 Personal history of peptic ulcer disease: Secondary | ICD-10-CM | POA: Diagnosis not present

## 2022-03-04 NOTE — Progress Notes (Signed)
Virtual Visit via Telephone Note ? ?I connected with Jared Bridges on 03/04/22 at  2:27 PM EDT by telephone and verified that I am speaking with the correct person using two identifiers. ? ?Location: ?Patient: home ?Provider: office ?  ?I discussed the limitations, risks, security and privacy concerns of performing an evaluation and management service by telephone and the availability of in person appointments. I also discussed with the patient that there may be a patient responsible charge related to this service. The patient expressed understanding and agreed to proceed. ? ? ?History of Present Illness: ? ?Patient is 88 year old Caucasian male with remote history of upper GI bleed secondary to peptic ulcer disease as well as GERD whose last visit was on 09/03/2021 also via telephone. ?Today's visit was completed with help of his daughter Jared Bridges who was sitting next to her dad. ?Jared Bridges states her father is doing well.  He has been wheelchair-bound since he fell in August last year and sustained fracture to 2 of cervical vertebrae.  He was sent to rehab center in [place] where he lost 30 pounds.  She states he just did not like to food at the facility.  He was at that facility from November 9 through November 13 2021.  He is not being followed at [facility] and they have recommended surgery.  He is using motorized chair at home.  He has not been able to walk. ?He is not having any bladder or bowel issues.  He is having at least 7 bowel movements per week.  He denies abdominal pain nausea vomiting heartburn or dysphagia.  He has been eating much better since he has been back at home.  Family is not sure how many pounds he has gained.  He is taking ibuprofen no more than once or twice a week and Excedrin no more than once every 2 weeks. ? ?  ?Current Outpatient Medications:  ?  acetaminophen (TYLENOL) 325 MG tablet, Take 2 tablets (650 mg total) by mouth 3 (three) times daily., Disp: , Rfl:  ?   alfuzosin (UROXATRAL) 10 MG 24 hr tablet, Take 10 mg by mouth every evening., Disp: , Rfl:  ?  Apremilast (OTEZLA) 30 MG TABS, Take 1 tablet by mouth 2 (two) times daily., Disp: , Rfl:  ?  atorvastatin (LIPITOR) 40 MG tablet, Take 1 tablet (40 mg total) by mouth daily., Disp: 90 tablet, Rfl: 0 ?  Cholecalciferol (VITAMIN D3) 250 MCG (10000 UT) TABS, Take 1,000 Units by mouth daily., Disp: , Rfl:  ?  diclofenac sodium (VOLTAREN) 1 % GEL, Apply 2 g topically daily as needed (for pain)., Disp: 2 g, Rfl: 1 ?  enzalutamide (XTANDI) 80 MG tablet, Take 80 mg by mouth daily., Disp: , Rfl:  ?  escitalopram (LEXAPRO) 20 MG tablet, Take 20 mg by mouth daily., Disp: , Rfl:  ?  ferrous sulfate 325 (65 FE) MG tablet, Take 325 mg by mouth daily with breakfast., Disp: , Rfl:  ?  finasteride (PROSCAR) 5 MG tablet, Take 1 tablet (5 mg total) by mouth daily., Disp: 90 tablet, Rfl: 3 ?  IBUPROFEN PO, Take by mouth. prn, Disp: , Rfl:  ?  mirabegron ER (MYRBETRIQ) 50 MG TB24 tablet, Myrbetriq 50 mg tablet,extended release  TAKE 1 TABLET BY MOUTH DAILY, Disp: , Rfl:  ?  OVER THE COUNTER MEDICATION, Calcium '400mg'$  twice a day, Disp: , Rfl:  ?  OVER THE COUNTER MEDICATION, Excedrin migraine as needed, Disp: , Rfl:  ?  OVER THE COUNTER MEDICATION, B12 1,000 once daily, Disp: , Rfl:  ?  pantoprazole (PROTONIX) 20 MG tablet, Take 1 tablet (20 mg total) by mouth daily., Disp: 90 tablet, Rfl: 3 ?  pregabalin (LYRICA) 75 MG capsule, Take 75 mg by mouth 2 (two) times daily., Disp: , Rfl:  ?  topiramate (TOPAMAX) 50 MG tablet, Take 50 mg by mouth 2 (two) times daily., Disp: , Rfl:  ?  venlafaxine XR (EFFEXOR-XR) 37.5 MG 24 hr capsule, Take 1 capsule (37.5 mg total) by mouth every morning., Disp: 90 capsule, Rfl: 0 ?  vitamin B-12 (CYANOCOBALAMIN) 500 MCG tablet, Take 1,000 mcg by mouth daily., Disp: , Rfl:  ?  zolpidem (AMBIEN) 5 MG tablet, Take 1 tablet (5 mg total) by mouth at bedtime., Disp: 30 tablet, Rfl: 0 ?  Ciclopirox 0.77 % gel, ciclopirox  0.77 % topical gel  APPLY TO THE AFFECTED AREA(S) TWICE DAILY (Patient not taking: Reported on 03/04/2022), Disp: , Rfl:  ?  fluocinonide cream (LIDEX) 2.63 %, Apply 1 application topically daily as needed (for irritation). (Patient not taking: Reported on 03/04/2022), Disp: , Rfl:  ?  senna-docusate (SENOKOT-S) 8.6-50 MG tablet, Take 1 tablet by mouth 2 (two) times daily. (Patient not taking: Reported on 03/04/2022), Disp: , Rfl:  ? ?Medication list reviewed and updated. ? ?Observations/Objective: ? ?His weight was reported to be 181 pounds. ?Weight was 210 pounds on 09/03/2021. ? ? ?Assessment and Plan: ? ?#1.  Remote history of upper GI bleed secondary to peptic ulcer disease secondary to NSAID therapy.  He is on PPI for prophylaxis.  He is using NSAIDs very sparingly. ? ?#2.  Weight loss.  Weight loss occurred while he was at rehab center in [place].  It appears weight loss has leveled off since she has been at home. ? ? ?Follow Up Instructions: ? ?Continue pantoprazole at 20 mg by mouth 30 minutes before breakfast daily. ?Patient once again reminded to keep NSAID use to minimum. ?Jared Bridges would monitor her weight and notify if he continues to lose weight. ?Office visit in 6 months. ?  ?I discussed the assessment and treatment plan with the patient. The patient was provided an opportunity to ask questions and all were answered. The patient agreed with the plan and demonstrated an understanding of the instructions. ?  ?The patient was advised to call back or seek an in-person evaluation if the symptoms worsen or if the condition fails to improve as anticipated. ? ?I provided 16 minutes of non-face-to-face time during this encounter. ? ? ?Hildred Laser, MD  ?

## 2022-03-10 DIAGNOSIS — R634 Abnormal weight loss: Secondary | ICD-10-CM | POA: Insufficient documentation

## 2022-04-27 ENCOUNTER — Emergency Department (HOSPITAL_COMMUNITY): Payer: Medicare Other

## 2022-04-27 ENCOUNTER — Other Ambulatory Visit: Payer: Self-pay

## 2022-04-27 ENCOUNTER — Emergency Department (HOSPITAL_COMMUNITY)
Admission: EM | Admit: 2022-04-27 | Discharge: 2022-04-27 | Disposition: A | Discharge status: Home / Self Care | Payer: Medicare Other | Attending: Emergency Medicine | Admitting: Emergency Medicine

## 2022-04-27 ENCOUNTER — Encounter (HOSPITAL_COMMUNITY): Payer: Self-pay | Admitting: *Deleted

## 2022-04-27 DIAGNOSIS — I713 Abdominal aortic aneurysm, ruptured, unspecified: Secondary | ICD-10-CM | POA: Diagnosis not present

## 2022-04-27 DIAGNOSIS — E1140 Type 2 diabetes mellitus with diabetic neuropathy, unspecified: Secondary | ICD-10-CM

## 2022-04-27 DIAGNOSIS — D649 Anemia, unspecified: Secondary | ICD-10-CM | POA: Diagnosis not present

## 2022-04-27 DIAGNOSIS — R319 Hematuria, unspecified: Secondary | ICD-10-CM | POA: Diagnosis present

## 2022-04-27 DIAGNOSIS — R011 Cardiac murmur, unspecified: Secondary | ICD-10-CM | POA: Diagnosis not present

## 2022-04-27 DIAGNOSIS — Z8546 Personal history of malignant neoplasm of prostate: Secondary | ICD-10-CM | POA: Insufficient documentation

## 2022-04-27 DIAGNOSIS — R531 Weakness: Secondary | ICD-10-CM | POA: Diagnosis not present

## 2022-04-27 DIAGNOSIS — I714 Abdominal aortic aneurysm, without rupture, unspecified: Secondary | ICD-10-CM

## 2022-04-27 DIAGNOSIS — H9190 Unspecified hearing loss, unspecified ear: Secondary | ICD-10-CM

## 2022-04-27 DIAGNOSIS — I10 Essential (primary) hypertension: Secondary | ICD-10-CM | POA: Insufficient documentation

## 2022-04-27 DIAGNOSIS — Z91040 Latex allergy status: Secondary | ICD-10-CM | POA: Insufficient documentation

## 2022-04-27 DIAGNOSIS — E1169 Type 2 diabetes mellitus with other specified complication: Secondary | ICD-10-CM | POA: Insufficient documentation

## 2022-04-27 DIAGNOSIS — R2243 Localized swelling, mass and lump, lower limb, bilateral: Secondary | ICD-10-CM | POA: Insufficient documentation

## 2022-04-27 LAB — COMPREHENSIVE METABOLIC PANEL
ALT: 8 U/L (ref 0–44)
AST: 13 U/L — ABNORMAL LOW (ref 15–41)
Albumin: 3.3 g/dL — ABNORMAL LOW (ref 3.5–5.0)
Alkaline Phosphatase: 72 U/L (ref 38–126)
Anion gap: 5 (ref 5–15)
BUN: 16 mg/dL (ref 8–23)
CO2: 26 mmol/L (ref 22–32)
Calcium: 9.3 mg/dL (ref 8.9–10.3)
Chloride: 107 mmol/L (ref 98–111)
Creatinine, Ser: 0.96 mg/dL (ref 0.61–1.24)
GFR, Estimated: >60 mL/min (ref >60)
Glucose, Bld: 129 mg/dL — ABNORMAL HIGH (ref 70–99)
Potassium: 3.8 mmol/L (ref 3.5–5.1)
Sodium: 138 mmol/L (ref 135–145)
Total Bilirubin: 0.5 mg/dL (ref 0.3–1.2)
Total Protein: 6.7 g/dL (ref 6.5–8.1)

## 2022-04-27 LAB — TYPE AND SCREEN
ABO/RH(D): O POS
Antibody Screen: NEGATIVE

## 2022-04-27 LAB — CBC WITH DIFFERENTIAL/PLATELET
Abs Immature Granulocytes: 0.04 10*3/uL (ref 0.00–0.07)
Basophils Absolute: 0 10*3/uL (ref 0.0–0.1)
Basophils Relative: 0 %
Eosinophils Absolute: 0.3 10*3/uL (ref 0.0–0.5)
Eosinophils Relative: 5 %
HCT: 35.7 % — ABNORMAL LOW (ref 39.0–52.0)
Hemoglobin: 11.1 g/dL — ABNORMAL LOW (ref 13.0–17.0)
Immature Granulocytes: 1 %
Lymphocytes Relative: 7 %
Lymphs Abs: 0.5 10*3/uL — ABNORMAL LOW (ref 0.7–4.0)
MCH: 28.7 pg (ref 26.0–34.0)
MCHC: 31.1 g/dL (ref 30.0–36.0)
MCV: 92.2 fL (ref 80.0–100.0)
Monocytes Absolute: 0.6 10*3/uL (ref 0.1–1.0)
Monocytes Relative: 8 %
Neutro Abs: 5.8 10*3/uL (ref 1.7–7.7)
Neutrophils Relative %: 79 %
Platelets: 226 10*3/uL (ref 150–400)
RBC: 3.87 MIL/uL — ABNORMAL LOW (ref 4.22–5.81)
RDW: 14.6 % (ref 11.5–15.5)
WBC: 7.3 10*3/uL (ref 4.0–10.5)
nRBC: 0 % (ref 0.0–0.2)

## 2022-04-27 LAB — URINALYSIS, ROUTINE W REFLEX MICROSCOPIC
Bacteria, UA: NONE SEEN
Bilirubin Urine: NEGATIVE
Glucose, UA: NEGATIVE mg/dL
Ketones, ur: NEGATIVE mg/dL
Leukocytes,Ua: NEGATIVE
Nitrite: NEGATIVE
Protein, ur: NEGATIVE mg/dL
Specific Gravity, Urine: 1.017 (ref 1.005–1.030)
pH: 6 (ref 5.0–8.0)

## 2022-04-27 LAB — PROTIME-INR
INR: 1.1 (ref 0.8–1.2)
Prothrombin Time: 13.6 seconds (ref 11.4–15.2)

## 2022-04-27 LAB — CBG MONITORING, ED: Glucose-Capillary: 142 mg/dL — ABNORMAL HIGH (ref 70–99)

## 2022-04-27 MED ORDER — IOHEXOL 350 MG/ML SOLN
100.0000 mL | Freq: Once | INTRAVENOUS | Status: AC | PRN
  Administered 2022-04-27: 100 mL via INTRAVENOUS

## 2022-04-27 MED ORDER — DIPHENHYDRAMINE HCL 25 MG PO CAPS: 50.0000 mg | ORAL_CAPSULE | Freq: Once | ORAL | Status: AC

## 2022-04-27 MED ORDER — METHYLPREDNISOLONE SODIUM SUCC 40 MG IJ SOLR
40.0000 mg | Freq: Once | INTRAMUSCULAR | Status: AC
  Administered 2022-04-27: 40 mg via INTRAVENOUS
  Filled 2022-04-27: qty 1

## 2022-04-27 MED ORDER — CEPHALEXIN 500 MG PO CAPS: 500.0000 mg | ORAL_CAPSULE | Freq: Four times a day (QID) | ORAL | 0 refills | Status: AC

## 2022-04-27 MED ORDER — DIPHENHYDRAMINE HCL 50 MG/ML IJ SOLN
50.0000 mg | Freq: Once | INTRAMUSCULAR | Status: AC
  Administered 2022-04-27: 50 mg via INTRAVENOUS
  Filled 2022-04-27: qty 1

## 2022-04-27 NOTE — Discharge Instructions (Addendum)
The contact information for a local urologist has been provided for you.  Please call and schedule an appointment within the next 24 to 48 hours to be seen as soon as possible.  Your urine did not show any immediate evidence of infection, but your urine culture still pending.  You have been provided empiric antibiotics to take for a possible UTI.  Take one capsule every 6 hours (4 times per day) for the next 7 days, until the course is completed or until you are able to follow-up with urology -- which ever comes first.  Follow-up with your PCP as well within the next 5 to 7 days for reevaluation and continued medical management.  Return to the ED for new or worsening symptoms as discussed.

## 2022-04-27 NOTE — ED Notes (Signed)
Family updated as to patient's status.

## 2022-04-27 NOTE — ED Notes (Signed)
Patient transported to CT 

## 2022-04-27 NOTE — ED Notes (Signed)
ED PA at [facility] 

## 2022-04-27 NOTE — ED Triage Notes (Addendum)
BIB [place] from home for hematuria w/ blood clots, and general weakness. Onset today. Lives with wife and daughter (pending arrival). Wanted to get checked out. Alert, NAD, calm, interactive, chronic c-collar present, for c-spine fxs (2). [facility]. H/o Prostate CA with METS to bone. Family described per EMS "found ~7" diameter blood stain in bed".

## 2022-04-27 NOTE — ED Provider Notes (Signed)
[facility] Provider Note   CSN: [id number] Arrival date & time: 04/27/22  1255     History  Chief Complaint  Patient presents with   Hematuria    SONY SCHLARB is a 88 y.o. male with chief complaint of sudden painless bloody urine and weakness earlier this morning.  Came straight to the emergency department.  Said they happen around the same time, and the blood poured out when he was being.  Denies rectal bleeding, nausea, vomiting, headache, changes in bowel movements, abdominal pain.  Denies chest pain, shortness of breath.  Hx of BPH and prostate cancer, but was recently cleared within the last 3 months of malignancy.  Is currently wheelchair-bound, does not ambulate on his own.  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  Also has pertinent history of AAA, neurogenic bladder, urinary incontinence, and nocturia.  Takes iron supplements.  Not on anticoagulation.    Complex additional hx of HTN, DMT2 with neuropathy, IDA, DJD, orthostatic hypotension, AV block, anxiety, cataracts, large intestinal adenoma, depression, prior GI bleeds, cardiac murmur, hypercholesterolemia, Eunuchoidism, polymyalgia rheumatica, pseudophakia, prior CVA, carotid stenosis, prior acute cholecystitis, TIA, migraines, vitamin D deficiency, hearing impairment, nonrheumatic aortic valve stenosis, OSA, central cord syndrome at C7 level of cervical spinal cord, wheelchair dependence, quadriplegia, PUD  The history is provided by the patient, medical records and a relative.  Hematuria      Home Medications Prior to Admission medications   Medication Sig Start Date End Date Taking? Authorizing Provider  acetaminophen (TYLENOL) 325 MG tablet Take 2 tablets (650 mg total) by mouth 3 (three) times daily. 07/25/21   Love, Ivan Anchors, PA-C  alfuzosin (UROXATRAL) 10 MG 24 hr tablet Take 10 mg by mouth every evening. 11/15/20   [provider]  Apremilast  (OTEZLA) 30 MG TABS Take 1 tablet by mouth 2 (two) times daily. 01/03/22   [provider]  atorvastatin (LIPITOR) 40 MG tablet Take 1 tablet (40 mg total) by mouth daily. 07/08/21   Mar Daring, PA-C  Cholecalciferol (VITAMIN D3) 250 MCG (10000 UT) TABS Take 1,000 Units by mouth daily.    [provider]  Ciclopirox 0.77 % gel ciclopirox 0.77 % topical gel  APPLY TO THE AFFECTED AREA(S) TWICE DAILY Patient not taking: Reported on 03/04/2022    [provider]  diclofenac sodium (VOLTAREN) 1 % GEL Apply 2 g topically daily as needed (for pain). 11/22/15   Dohmeier, Asencion Partridge, MD  enzalutamide Gillermina Phy) 80 MG tablet Take 80 mg by mouth daily. 11/05/20   [provider]  escitalopram (LEXAPRO) 20 MG tablet Take 20 mg by mouth daily. 08/23/21   [provider]  ferrous sulfate 325 (65 FE) MG tablet Take 325 mg by mouth daily with breakfast.    [provider]  finasteride (PROSCAR) 5 MG tablet Take 1 tablet (5 mg total) by mouth daily. 04/18/20   McKenzie, Candee Furbish, MD  fluocinonide cream (LIDEX) 9.56 % Apply 1 application topically daily as needed (for irritation). Patient not taking: Reported on 03/04/2022    [provider]  IBUPROFEN PO Take by mouth. prn    [provider]  mirabegron ER (MYRBETRIQ) 50 MG TB24 tablet Myrbetriq 50 mg tablet,extended release  TAKE 1 TABLET BY MOUTH DAILY    [provider]  OVER THE COUNTER MEDICATION Calcium '400mg'$  twice a day    [provider]  OVER THE COUNTER MEDICATION Excedrin migraine as needed  [provider]  pantoprazole (PROTONIX) 20 MG tablet Take 1 tablet (20 mg total) by mouth daily. 09/03/21   Rehman, Mechele Dawley, MD  pregabalin (LYRICA) 75 MG capsule Take 75 mg by mouth 2 (two) times daily.    [provider]  senna-docusate (SENOKOT-S) 8.6-50 MG tablet Take 1 tablet by mouth 2 (two) times daily. Patient not taking: Reported on 03/04/2022 07/05/21    Domenic Polite, MD  topiramate (TOPAMAX) 50 MG tablet Take 50 mg by mouth 2 (two) times daily.    [provider]  venlafaxine XR (EFFEXOR-XR) 37.5 MG 24 hr capsule Take 1 capsule (37.5 mg total) by mouth every morning. 07/08/21   Mar Daring, PA-C  vitamin B-12 (CYANOCOBALAMIN) 500 MCG tablet Take 1,000 mcg by mouth daily.    [provider]  zolpidem (AMBIEN) 5 MG tablet Take 1 tablet (5 mg total) by mouth at bedtime. 07/25/21   Love, Ivan Anchors, PA-C      Allergies    Iodine, Calan [verapamil], Cymbalta [duloxetine hcl], Iodinated contrast media, Latex, Lisinopril, Neurontin [gabapentin], Zoloft [sertraline hcl], Valium [diazepam], Flomax [tamsulosin hcl], Glimepiride, Melatonin, and Tape    Review of Systems   Review of Systems  Genitourinary:  Positive for hematuria.  Neurological:  Positive for weakness.   Physical Exam Updated Vital Signs BP (!) 150/66   Pulse 80   Temp 97.9 F (36.6 C) (Oral)   Resp 17   Ht '6\' 1"'$  (1.854 m)   Wt 83.9 kg   SpO2 96%   BMI 24.41 kg/m  Physical Exam Vitals and nursing note reviewed.  Constitutional:      General: He is not in acute distress.    Appearance: Normal appearance. He is well-developed. He is not ill-appearing or diaphoretic.  HENT:     Head: Normocephalic and atraumatic.  Eyes:     Conjunctiva/sclera: Conjunctivae normal.  Cardiovascular:     Rate and Rhythm: Normal rate and regular rhythm.     Pulses:          Radial pulses are 2+ on the right side and 2+ on the left side.       Dorsalis pedis pulses are 1+ on the right side and 1+ on the left side.       Posterior tibial pulses are 1+ on the right side and 1+ on the left side.     Heart sounds: Murmur heard.     Comments: CRT 3-5 seconds of the bilateral lower legs/feet with mild decreased sensation Pulmonary:     Effort: Pulmonary effort is normal. No respiratory distress.     Breath sounds: Rhonchi present.  Chest:     Chest wall: No tenderness.   Abdominal:     General: Bowel sounds are normal. There is no distension.     Palpations: Abdomen is soft.     Tenderness: There is abdominal tenderness. There is no right CVA tenderness or left CVA tenderness.  Musculoskeletal:        General: No swelling.     Cervical back: Neck supple.     Right lower leg: 1+ Edema present.     Left lower leg: 1+ Edema present.  Skin:    General: Skin is warm and dry.     Capillary Refill: Capillary refill takes less than 2 seconds.     Coloration: Skin is not jaundiced or pale.  Neurological:     Mental Status: He is alert and oriented to person, place, and time.  Psychiatric:        Mood and Affect: Mood normal.    ED Results / Procedures / Treatments   Labs (all labs ordered are listed, but only abnormal results are displayed) Labs Reviewed  CBC WITH DIFFERENTIAL/PLATELET - Abnormal; Notable for the following components:      Result Value   RBC 3.87 (*)    Hemoglobin 11.1 (*)    HCT 35.7 (*)    Lymphs Abs 0.5 (*)    All other components within normal limits  COMPREHENSIVE METABOLIC PANEL - Abnormal; Notable for the following components:   Glucose, Bld 129 (*)    Albumin 3.3 (*)    AST 13 (*)    All other components within normal limits  URINALYSIS, ROUTINE W REFLEX MICROSCOPIC - Abnormal; Notable for the following components:   Hgb urine dipstick LARGE (*)    All other components within normal limits  CBG MONITORING, ED - Abnormal; Notable for the following components:   Glucose-Capillary 142 (*)    All other components within normal limits  URINE CULTURE  PROTIME-INR  TYPE AND SCREEN    EKG EKG Interpretation  Date/Time:  Sunday Apr 27 2022 13:10:19 EDT Ventricular Rate:  79 PR Interval:  212 QRS Duration: 90 QT Interval:  405 QTC Calculation: 465 R Axis:   -55 Text Interpretation: Sinus rhythm Borderline prolonged PR interval Abnormal R-wave progression, late transition Inferior infarct, old Confirmed by Godfrey Pick  [phone]) on 04/27/2022 2:20:22 PM  Radiology CLINICAL DATA:  Known infrarenal abdominal aortic aneurysm.  Hematuria. Metastatic prostate cancer.     EXAM:  CT ANGIOGRAPHY CHEST, ABDOMEN AND PELVIS     TECHNIQUE:  Non-contrast CT of the chest was initially obtained.     Multidetector CT imaging through the chest, abdomen and pelvis was  performed using the standard protocol during bolus administration of  intravenous contrast. Multiplanar reconstructed images and MIPs were  obtained and reviewed to evaluate the vascular anatomy.     RADIATION DOSE REDUCTION: This exam was performed according to the  departmental dose-optimization program which includes automated  exposure control, adjustment of the mA and/or kV according to  patient size and/or use of iterative reconstruction technique.     CONTRAST:  143m OMNIPAQUE IOHEXOL 350 MG/ML SOLN     COMPARISON:  CT abdomen/pelvis dated 07/02/2020. CT chest dated  04/25/2015.     FINDINGS:  CTA CHEST FINDINGS     Cardiovascular: On unenhanced CT, there is no evidence of intramural  hematoma. Following contrast administration, there is no evidence of  thoracic aortic aneurysm or dissection. Atherosclerotic  calcifications of the aortic arch.     Although not tailored for evaluation of the pulmonary arteries,  there is no evidence of central pulmonary embolism.     The heart is normal in size.  No pericardial effusion.     Mild three-vessel coronary atherosclerosis.     Mediastinum/Nodes: No suspicious mediastinal lymphadenopathy.     Subcentimeter bilateral thyroid nodules, benign. No follow-up is  recommended.     Lungs/Pleura: Mild biapical pleural-parenchymal scarring.     Mild centrilobular and paraseptal emphysematous changes, upper lung  predominant.     Evaluation of the lung parenchyma is constrained by respiratory  motion. Within that constraint, there are scattered  peribronchovascular and subpleural nodules in the  posterior right  upper and lower lobes measuring up to 4 mm, largely unchanged from  2016, benign. No follow-up is recommended.     No pleural effusion or  pneumothorax.     Musculoskeletal: Degenerative changes of the thoracic spine.  Sclerotic metastasis at T12 (sagittal image 112).     Review of the MIP images confirms the above findings.     CTA ABDOMEN AND PELVIS FINDINGS     VASCULAR     Aorta: 3.3 cm infrarenal abdominal aortic aneurysm (series 5/image  137), previously 3.1 cm in 2021. This is of questionable clinical  significance, warranting follow-up ultrasound in 3 years. No  evidence of abdominal aortic dissection. Atherosclerotic  calcifications.     Celiac: Patent.     SMA: Patent.     Renals: Patent bilaterally.  Atherosclerotic calcifications.     IMA: Patent.     Inflow: Patent bilaterally.  Atherosclerotic calcifications.     Veins: Grossly unremarkable.     Review of the MIP images confirms the above findings.     NON-VASCULAR     Hepatobiliary: Liver is within normal limits.     Status post cholecystectomy. No intrahepatic or extrahepatic ductal  dilatation.     Pancreas: Within normal limits.     Spleen: Heterogeneous early arterial enhancement of the spleen.     Adrenals/Urinary Tract: Adrenal glands are within normal limits.     Left lower pole renal cortical atrophy. Subcentimeter right renal  cysts. No renal calculi or hydronephrosis.     Mildly thick-walled bladder, although underdistended.     Stomach/Bowel: Stomach is within normal limits.     No evidence of bowel obstruction.     Appendix is not discretely visualized.     No colonic wall thickening or inflammatory changes.     Lymphatic: No suspicious abdominopelvic lymphadenopathy.  Specifically, the prior retroperitoneal and right pelvic nodes have  resolved.     Reproductive: Prostate has decreased in size, without right lateral  asymmetric prominence when compared to  prior CT, in this patient  with known prostate cancer. Central gland indents the base of the  bladder, statistically favoring BPH.     Other: No abdominopelvic ascites.     Musculoskeletal: Status post PLIF at L4-5. Degenerative changes of  the lumbar spine. No focal osseous lesions.     Review of the MIP images confirms the above findings.     IMPRESSION:  No evidence of thoracoabdominal aortic dissection.     3.3 cm infrarenal abdominal aortic aneurysm, previously 3.1 cm. This  is of questionable clinical significance in this patient. If  warranted, consider follow-up ultrasound in 3 years.     Asymmetric appearance of the right lateral prostate has improved in  this patient with known prostate cancer. Retroperitoneal and right  pelvic lymphadenopathy has resolved. Stable sclerotic metastasis at  T12.     No specific CT findings to account for the patient's hematuria.  Consider urology consultation for cystoscopy as clinically  warranted.     Additional ancillary findings as above.        Electronically Signed    By: Julian Hy M.D.    On: 04/27/2022 20:43    Procedures Procedures    Medications Ordered in ED Medications  diphenhydrAMINE (BENADRYL) capsule 50 mg (has no administration in time range)    Or  diphenhydrAMINE (BENADRYL) injection 50 mg (has no administration in time range)  methylPREDNISolone sodium succinate (SOLU-MEDROL) 40 mg/mL injection 40 mg (40 mg Intravenous Given 04/27/22 1556)    ED Course/ Medical Decision Making/ A&P  Medical Decision Making Amount and/or Complexity of Data Reviewed External Data Reviewed: notes. Labs: ordered. Decision-making details documented in ED Course. Radiology: ordered. Decision-making details documented in ED Course. ECG/medicine tests:  Decision-making details documented in ED Course.  Risk OTC drugs. Prescription drug management.   88 y.o. male presents to the ED for  concern of Hematuria     This involves an extensive number of treatment options, and is a complaint that carries with it a high risk of complications and morbidity.  The emergent differential diagnosis prior to evaluation includes, but is not limited to: UTI, renal calculi, acute cystitis, pyelonephritis, malignancy of bladder, BPH  This is not an exhaustive differential.   Past Medical History / Co-morbidities / Social History: Significantly complex hx of HTN, and DMT2, IDA, DJD, orthostatic hypotension, AV block, anxiety, cataracts, large intestinal adenoma, depression, prior GI bleeds, cardiac murmur, hypercholesterolemia, BPH, Eunuchoidism, polymyalgia rheumatica, pseudophakia, prior CVA, carotid stenosis, prior acute cholecystitis, TIA, migraines, vitamin D deficiency, nocturia, prostate cancer, peripheral neuropathy due to diabetes, hearing impairment, prostatic hyperplasia, nonrheumatic aortic valve stenosis, OSA, urinary incontinence, central cord syndrome at C7 level of cervical spinal cord, wheelchair dependence, quadriplegia, PUD  Additional History:  Internal and external records from outside source obtained and reviewed including Oncology, Gastroenterology, Radiology  Physical Exam: Physical exam performed. The pertinent findings include: Mild-moderate suprapubic tenderness.  Cardiac murmur.  Rhonchi appreciated.    Lab Tests: I ordered, and personally interpreted labs.  The pertinent results include:   CBC: Mild anemia of Hgb 11.1 and HCT 35.7, close to baseline per recent labs. CMP/BMP: Overall unremarkable, no evidence of AKI CBG monitoring: 142 PT/INR: Unremarkable Urinalysis: Large Hgb quantity, without evidence of infection Urine culture: Pending  Imaging Studies: I ordered imaging studies including CTA chest/abdomen/pelvis No evidence of thoracoabdominal aortic dissection.  3.3 cm infrarenal abdominal aortic aneurysm, previously 3.1 cm.  This is of questionable clinical  significance in this patient. If warranted, consider follow-up ultrasound in 3 years.  Asymmetric appearance of the right lateral prostate has improved in this patient with known prostate cancer.  Retroperitoneal and right pelvic lymphadenopathy has resolved.  Stable sclerotic metastasis at T12. No specific CT findings to account for the patient's hematuria. Consider urology consultation for cystoscopy as clinically warranted.  Cardiac Monitoring: The patient was maintained on a cardiac monitor.  I personally viewed and interpreted the cardiac monitored which showed an underlying rhythm of: Normal sinus rhythm  I personally ordered and interpreted EKG 12 lead findings, which showed: No significant changes since prior reading  Medications: I ordered medication including benadryl and methylprednisolone for contrast allergy prophylaxis per protocol.  Reevaluation of the patient after these medicines showed that the patient tolerated this well and without complication.  I have reviewed the patients home medicines and have made adjustments as needed.  ED Course/Disposition: Pt well-appearing on exam.  AAOx4.  Complaining of hematuria and weakness started on the same time early this morning.  Hx of prostate cancer, for which she was recently cleared within the last 3 months of malignancy.  No recent illnesses or upper respiratory infections.  Patient is quadriplegic, currently wheelchair bound.  Cardiac murmur appreciated, per records appears to be consistent with baseline.  Mild rhonchi, cleared with cough.  Global weakness close to baseline with no appreciated unilateral or significant abnormalities.  Extremity neuropathy consistent with DMT2, per records.  EKG without significant changes.  New mild suprapubic tenderness on exam and dysuria.  Urinalysis and urine culture pending.  No  evidence of AKI or worsening/new anemia on labs.  Ordered CTA chest/abdomen/pelvis to further assess for further AAA  dissection.  Allergy to CT iodinated contrast with itching and mild rash without evidence of respiratory distress or anaphylaxis.  CT protocol for iodine contrast allergy initiated.  Differential of AAA vs BPH w/wo obstruction vs bladder malignancy vs acute cystitis at this time.  1530: Urinalysis indicated large quantity of Hgb without evidence of infection.  Abdominal ultrasound imaging performed by my attending Dr. Doren Custard to further evaluate known AAA.  Read his note for further details.  I personally visualized AAA largest dilation of 3.4 cm, compared to last measurement of 3.0 cm.   2100: CTA imaging resulted.  No evidence of dissection, acute cystitis, or significant changes to prostate.  Abdominal aorta has increased, but by only by 0.2 cm and not of concerning increase or size.  No emergent condition or acute changes visualized.  Weakness not likely due to immediate blood loss or anemia.  Urinary issues not likely due to occlusion via prostate.  No arrhythmias or suggestions of acute heart failure.  Appears clinically improved from initial evaluation after resting.  Urine culture pending.  UA not indicative of significant infection but still experiencing possible dysuria.  Does not appear septic.  Provided empiric antibiotics for possible UTI.  Overall, I am uncertain the exact etiology of the patient's symptoms.  However, I do not believe he is currently experiencing a medical, surgical, or psychiatric emergency.  Follow up closely with Urology for further evaluation and possible cystoscopy.  Follow up with PCP.  Pt in good condition and in NAD at time of discharge.  After consideration of the diagnostic results and the patient's encounter today, I feel that the emergency department workup does not suggest an emergent condition requiring admission or immediate intervention beyond what has been performed at this time.  The patient is safe for discharge and has been instructed to return immediately for  worsening symptoms, change in symptoms or any other concerns.  Discussed course of treatment thoroughly with the patient, whom demonstrated understanding.  Patient in agreement and has no further questions.  I discussed this case with my attending, Dr. Doren Custard, who agreed with the proposed treatment course and cosigned this note including patient's presenting symptoms, physical exam, and planned diagnostics and interventions.  Attending physician stated agreement with plan or made changes to plan which were implemented.     This chart was dictated using voice recognition software.  Despite best efforts to proofread, errors can occur which can change the documentation meaning.         Final Clinical Impression(s) / ED Diagnoses Final diagnoses:  Hematuria, unspecified type  Type 2 diabetes mellitus with diabetic neuropathy, unspecified whether long term insulin use ([place])  Hearing difficulty, unspecified laterality  Cardiac murmur  Abdominal aortic aneurysm (AAA), unspecified part, unspecified whether ruptured [facility])    Rx / DC Orders ED Discharge Orders          Ordered    cephALEXin (KEFLEX) 500 MG capsule  4 times daily        04/27/22 2108              Prince Rome, PA-C 20/94/70 0052    Godfrey Pick, MD 05/05/22 [phone]

## 2022-04-28 LAB — URINE CULTURE: Culture: NO GROWTH

## 2022-05-23 IMAGING — CR DG CHEST 2V
2 series · 3 of 3 positions shown · non-contrast
Comparison: 11/01/2020 and prior studies

CLINICAL DATA: Chest congestion

EXAM:
CHEST - 2 VIEW

[Series 2: chest lat · 0.14mm/px · 2 of 2 slices shown]
[im 1/2]
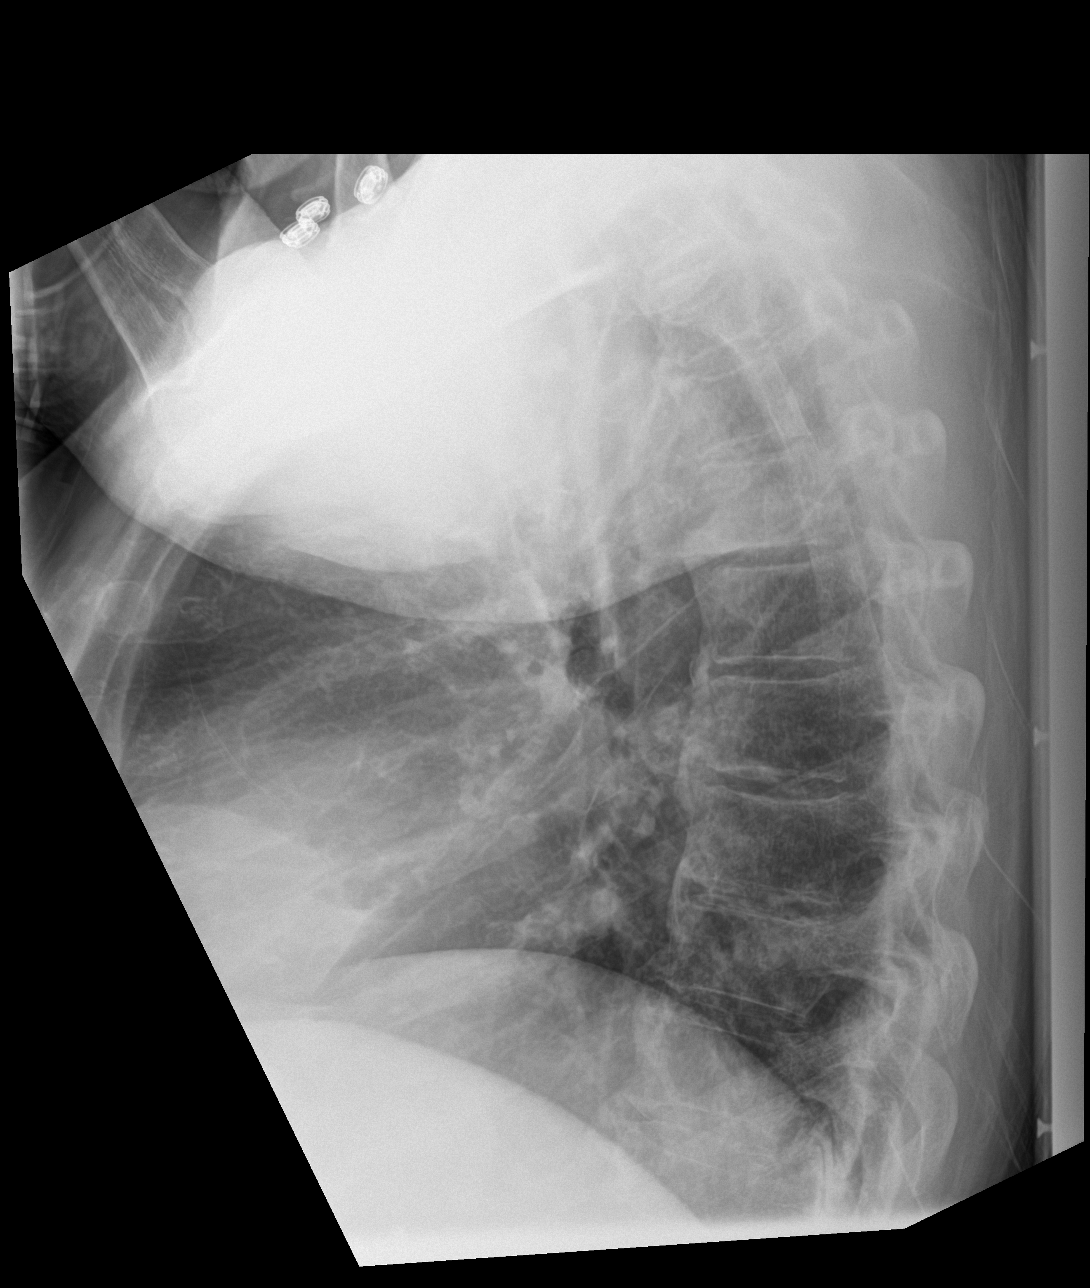
[im 2/2]
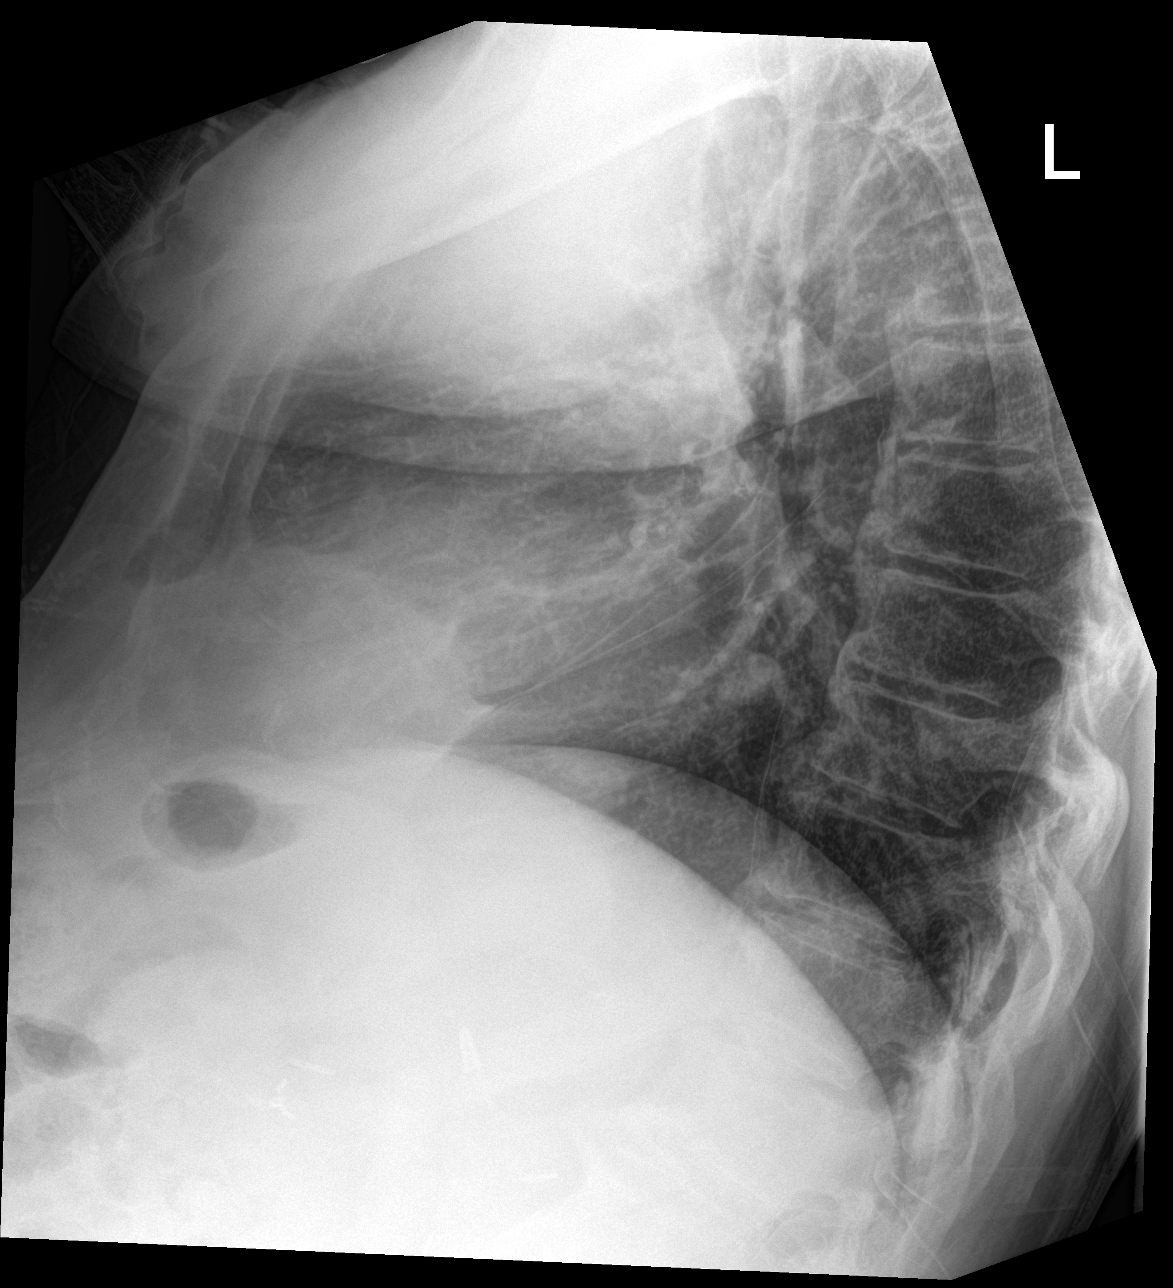

[chest ap]
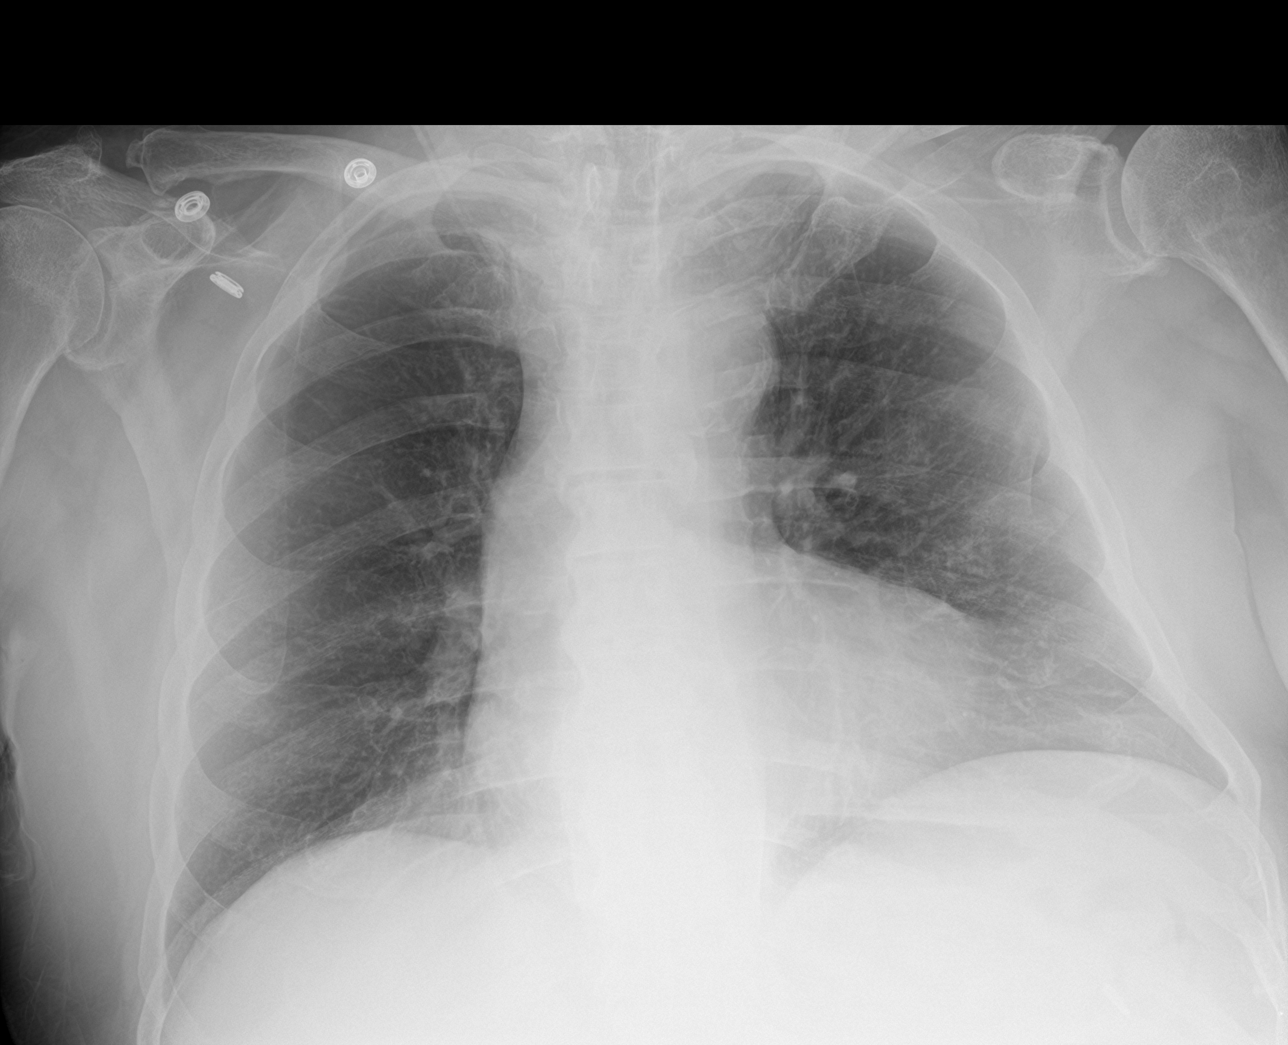

[3 of 3 positions shown; findings below may reference images not displayed]

FINDINGS: The cardiomediastinal silhouette is unchanged.

There is no evidence of focal airspace disease, pulmonary edema,
suspicious pulmonary nodule/mass, pleural effusion, or pneumothorax.

No acute bony abnormalities are identified.
IMPRESSION: No active cardiopulmonary disease.

## 2022-06-15 ENCOUNTER — Inpatient Hospital Stay (HOSPITAL_COMMUNITY)
Admission: EM | Admit: 2022-06-15 | Discharge: 2022-06-24 | DRG: 871 | Disposition: A | Discharge status: Skilled Nursing Facility | Payer: Medicare Other | Attending: Internal Medicine | Admitting: Internal Medicine

## 2022-06-15 ENCOUNTER — Emergency Department (HOSPITAL_COMMUNITY): Payer: Medicare Other

## 2022-06-15 ENCOUNTER — Encounter (HOSPITAL_COMMUNITY): Payer: Self-pay

## 2022-06-15 ENCOUNTER — Other Ambulatory Visit: Payer: Self-pay

## 2022-06-15 DIAGNOSIS — D509 Iron deficiency anemia, unspecified: Secondary | ICD-10-CM | POA: Diagnosis present

## 2022-06-15 DIAGNOSIS — C61 Malignant neoplasm of prostate: Secondary | ICD-10-CM | POA: Diagnosis present

## 2022-06-15 DIAGNOSIS — A419 Sepsis, unspecified organism: Principal | ICD-10-CM | POA: Diagnosis present

## 2022-06-15 DIAGNOSIS — N401 Enlarged prostate with lower urinary tract symptoms: Secondary | ICD-10-CM | POA: Diagnosis present

## 2022-06-15 DIAGNOSIS — D72829 Elevated white blood cell count, unspecified: Secondary | ICD-10-CM | POA: Diagnosis present

## 2022-06-15 DIAGNOSIS — H9190 Unspecified hearing loss, unspecified ear: Secondary | ICD-10-CM | POA: Diagnosis present

## 2022-06-15 DIAGNOSIS — R1313 Dysphagia, pharyngeal phase: Secondary | ICD-10-CM | POA: Diagnosis present

## 2022-06-15 DIAGNOSIS — Z87891 Personal history of nicotine dependence: Secondary | ICD-10-CM

## 2022-06-15 DIAGNOSIS — E782 Mixed hyperlipidemia: Secondary | ICD-10-CM | POA: Diagnosis present

## 2022-06-15 DIAGNOSIS — D649 Anemia, unspecified: Secondary | ICD-10-CM

## 2022-06-15 DIAGNOSIS — E1142 Type 2 diabetes mellitus with diabetic polyneuropathy: Secondary | ICD-10-CM | POA: Diagnosis present

## 2022-06-15 DIAGNOSIS — J690 Pneumonitis due to inhalation of food and vomit: Secondary | ICD-10-CM | POA: Diagnosis present

## 2022-06-15 DIAGNOSIS — R6521 Severe sepsis with septic shock: Secondary | ICD-10-CM | POA: Diagnosis present

## 2022-06-15 DIAGNOSIS — G43909 Migraine, unspecified, not intractable, without status migrainosus: Secondary | ICD-10-CM | POA: Diagnosis present

## 2022-06-15 DIAGNOSIS — R319 Hematuria, unspecified: Secondary | ICD-10-CM | POA: Diagnosis present

## 2022-06-15 DIAGNOSIS — N138 Other obstructive and reflux uropathy: Secondary | ICD-10-CM | POA: Diagnosis present

## 2022-06-15 DIAGNOSIS — Z888 Allergy status to other drugs, medicaments and biological substances status: Secondary | ICD-10-CM

## 2022-06-15 DIAGNOSIS — Z993 Dependence on wheelchair: Secondary | ICD-10-CM

## 2022-06-15 DIAGNOSIS — C7951 Secondary malignant neoplasm of bone: Secondary | ICD-10-CM | POA: Diagnosis present

## 2022-06-15 DIAGNOSIS — Z8719 Personal history of other diseases of the digestive system: Secondary | ICD-10-CM

## 2022-06-15 DIAGNOSIS — J9811 Atelectasis: Secondary | ICD-10-CM | POA: Diagnosis present

## 2022-06-15 DIAGNOSIS — I5032 Chronic diastolic (congestive) heart failure: Secondary | ICD-10-CM | POA: Diagnosis present

## 2022-06-15 DIAGNOSIS — R799 Abnormal finding of blood chemistry, unspecified: Secondary | ICD-10-CM

## 2022-06-15 DIAGNOSIS — E861 Hypovolemia: Secondary | ICD-10-CM | POA: Diagnosis present

## 2022-06-15 DIAGNOSIS — Z8249 Family history of ischemic heart disease and other diseases of the circulatory system: Secondary | ICD-10-CM

## 2022-06-15 DIAGNOSIS — R339 Retention of urine, unspecified: Secondary | ICD-10-CM | POA: Diagnosis present

## 2022-06-15 DIAGNOSIS — I10 Essential (primary) hypertension: Secondary | ICD-10-CM | POA: Diagnosis present

## 2022-06-15 DIAGNOSIS — I110 Hypertensive heart disease with heart failure: Secondary | ICD-10-CM | POA: Diagnosis present

## 2022-06-15 DIAGNOSIS — Z981 Arthrodesis status: Secondary | ICD-10-CM

## 2022-06-15 DIAGNOSIS — Z791 Long term (current) use of non-steroidal anti-inflammatories (NSAID): Secondary | ICD-10-CM

## 2022-06-15 DIAGNOSIS — Z79899 Other long term (current) drug therapy: Secondary | ICD-10-CM

## 2022-06-15 DIAGNOSIS — K219 Gastro-esophageal reflux disease without esophagitis: Secondary | ICD-10-CM | POA: Diagnosis present

## 2022-06-15 DIAGNOSIS — E1165 Type 2 diabetes mellitus with hyperglycemia: Secondary | ICD-10-CM | POA: Diagnosis present

## 2022-06-15 DIAGNOSIS — Z20822 Contact with and (suspected) exposure to covid-19: Secondary | ICD-10-CM | POA: Diagnosis present

## 2022-06-15 DIAGNOSIS — E872 Acidosis, unspecified: Secondary | ICD-10-CM | POA: Diagnosis present

## 2022-06-15 DIAGNOSIS — Z9109 Other allergy status, other than to drugs and biological substances: Secondary | ICD-10-CM

## 2022-06-15 DIAGNOSIS — D638 Anemia in other chronic diseases classified elsewhere: Secondary | ICD-10-CM | POA: Diagnosis present

## 2022-06-15 DIAGNOSIS — N390 Urinary tract infection, site not specified: Secondary | ICD-10-CM | POA: Diagnosis present

## 2022-06-15 DIAGNOSIS — Z8711 Personal history of peptic ulcer disease: Secondary | ICD-10-CM

## 2022-06-15 DIAGNOSIS — Z96652 Presence of left artificial knee joint: Secondary | ICD-10-CM | POA: Diagnosis present

## 2022-06-15 DIAGNOSIS — M353 Polymyalgia rheumatica: Secondary | ICD-10-CM | POA: Diagnosis present

## 2022-06-15 DIAGNOSIS — G9340 Encephalopathy, unspecified: Secondary | ICD-10-CM

## 2022-06-15 DIAGNOSIS — G9341 Metabolic encephalopathy: Secondary | ICD-10-CM | POA: Diagnosis present

## 2022-06-15 DIAGNOSIS — Z7985 Long-term (current) use of injectable non-insulin antidiabetic drugs: Secondary | ICD-10-CM

## 2022-06-15 DIAGNOSIS — R4189 Other symptoms and signs involving cognitive functions and awareness: Secondary | ICD-10-CM | POA: Diagnosis present

## 2022-06-15 DIAGNOSIS — F32A Depression, unspecified: Secondary | ICD-10-CM | POA: Diagnosis present

## 2022-06-15 DIAGNOSIS — Z91041 Radiographic dye allergy status: Secondary | ICD-10-CM

## 2022-06-15 DIAGNOSIS — Z91040 Latex allergy status: Secondary | ICD-10-CM

## 2022-06-15 DIAGNOSIS — E041 Nontoxic single thyroid nodule: Secondary | ICD-10-CM | POA: Diagnosis present

## 2022-06-15 DIAGNOSIS — R531 Weakness: Secondary | ICD-10-CM | POA: Diagnosis not present

## 2022-06-15 DIAGNOSIS — Z8673 Personal history of transient ischemic attack (TIA), and cerebral infarction without residual deficits: Secondary | ICD-10-CM

## 2022-06-15 DIAGNOSIS — Z961 Presence of intraocular lens: Secondary | ICD-10-CM | POA: Diagnosis present

## 2022-06-15 DIAGNOSIS — Z9841 Cataract extraction status, right eye: Secondary | ICD-10-CM

## 2022-06-15 DIAGNOSIS — Z1624 Resistance to multiple antibiotics: Secondary | ICD-10-CM | POA: Diagnosis present

## 2022-06-15 LAB — BASIC METABOLIC PANEL
Anion gap: 7 (ref 5–15)
BUN: 27 mg/dL — ABNORMAL HIGH (ref 8–23)
CO2: 24 mmol/L (ref 22–32)
Calcium: 9.1 mg/dL (ref 8.9–10.3)
Chloride: 106 mmol/L (ref 98–111)
Creatinine, Ser: 1.15 mg/dL (ref 0.61–1.24)
GFR, Estimated: >60 mL/min (ref >60)
Glucose, Bld: 139 mg/dL — ABNORMAL HIGH (ref 70–99)
Potassium: 3.8 mmol/L (ref 3.5–5.1)
Sodium: 137 mmol/L (ref 135–145)

## 2022-06-15 LAB — CBC
HCT: 33.3 % — ABNORMAL LOW (ref 39.0–52.0)
Hemoglobin: 10.5 g/dL — ABNORMAL LOW (ref 13.0–17.0)
MCH: 28.5 pg (ref 26.0–34.0)
MCHC: 31.5 g/dL (ref 30.0–36.0)
MCV: 90.5 fL (ref 80.0–100.0)
Platelets: 238 10*3/uL (ref 150–400)
RBC: 3.68 MIL/uL — ABNORMAL LOW (ref 4.22–5.81)
RDW: 14 % (ref 11.5–15.5)
WBC: 12.6 10*3/uL — ABNORMAL HIGH (ref 4.0–10.5)
nRBC: 0 % (ref 0.0–0.2)

## 2022-06-15 LAB — CBG MONITORING, ED: Glucose-Capillary: 141 mg/dL — ABNORMAL HIGH (ref 70–99)

## 2022-06-15 MED ORDER — SODIUM CHLORIDE 0.9 % IV BOLUS
500.0000 mL | Freq: Once | INTRAVENOUS | Status: AC
  Administered 2022-06-15: 500 mL via INTRAVENOUS

## 2022-06-15 NOTE — ED Provider Notes (Signed)
[facility] EMERGENCY DEPARTMENT Provider Note   CSN: [id number] Arrival date & time: 06/15/22  2121     History  Chief Complaint  Patient presents with   Weakness    Jared Bridges is a 88 y.o. male.   Weakness Patient resents with weakness confusion.  More confused.  Reportedly retaining urine.  Patient cannot really provide much history.  Cannot really tell me what is going on but states that he feels little off.    Past Medical History:  Diagnosis Date   Anemia, iron deficiency    Chronic headaches    Migraines   Chronic kidney disease    Resolved   Depression    Diverticulitis    Essential hypertension    GI bleed    Dr. Laural Golden - 1998   Hypercholesteremia    Orthostatic hypotension    Peptic ulcer disease    Peripheral neuropathy    Polymyalgia rheumatica ([name]    Stroke ([place])    Residual trouble reading and writing   Syncope    Thyroid nodule    TIA (transient ischemic attack) 12/2017   Type 2 diabetes mellitus ([place])     Home Medications Prior to Admission medications   Medication Sig Start Date End Date Taking? Authorizing Provider  acetaminophen (TYLENOL) 325 MG tablet Take 2 tablets (650 mg total) by mouth 3 (three) times daily. 07/25/21   Love, Ivan Anchors, PA-C  alfuzosin (UROXATRAL) 10 MG 24 hr tablet Take 10 mg by mouth every evening. 11/15/20   [provider]  Apremilast (OTEZLA) 30 MG TABS Take 1 tablet by mouth 2 (two) times daily. 01/03/22   [provider]  atorvastatin (LIPITOR) 40 MG tablet Take 1 tablet (40 mg total) by mouth daily. 07/08/21   Mar Daring, PA-C  Cholecalciferol (VITAMIN D3) 250 MCG (10000 UT) TABS Take 1,000 Units by mouth daily.    [provider]  Ciclopirox 0.77 % gel ciclopirox 0.77 % topical gel  APPLY TO THE AFFECTED AREA(S) TWICE DAILY Patient not taking: Reported on 03/04/2022    [provider]  diclofenac sodium (VOLTAREN) 1 % GEL Apply 2 g topically daily as needed (for  pain). 11/22/15   Dohmeier, Asencion Partridge, MD  enzalutamide Gillermina Phy) 80 MG tablet Take 80 mg by mouth daily. 11/05/20   [provider]  escitalopram (LEXAPRO) 20 MG tablet Take 20 mg by mouth daily. 08/23/21   [provider]  ferrous sulfate 325 (65 FE) MG tablet Take 325 mg by mouth daily with breakfast.    [provider]  finasteride (PROSCAR) 5 MG tablet Take 1 tablet (5 mg total) by mouth daily. 04/18/20   McKenzie, Candee Furbish, MD  fluocinonide cream (LIDEX) 8.88 % Apply 1 application topically daily as needed (for irritation). Patient not taking: Reported on 03/04/2022    [provider]  IBUPROFEN PO Take by mouth. prn    [provider]  mirabegron ER (MYRBETRIQ) 50 MG TB24 tablet Myrbetriq 50 mg tablet,extended release  TAKE 1 TABLET BY MOUTH DAILY    [provider]  OVER THE COUNTER MEDICATION Calcium 458m twice a day    [provider]  OVER THE COUNTER MEDICATION Excedrin migraine as needed    [provider]  pantoprazole (PROTONIX) 20 MG tablet Take 1 tablet (20 mg total) by mouth daily. 09/03/21   Rehman, NMechele Dawley MD  pregabalin (LYRICA) 75 MG capsule Take 75 mg by mouth 2 (two) times daily.    [provider]  senna-docusate (SENOKOT-S) 8.6-50 MG tablet Take 1 tablet by mouth 2 (two) times daily. Patient not taking: Reported on 03/04/2022 07/05/21   Domenic Polite, MD  topiramate (TOPAMAX) 50 MG tablet Take 50 mg by mouth 2 (two) times daily.    [provider]  venlafaxine XR (EFFEXOR-XR) 37.5 MG 24 hr capsule Take 1 capsule (37.5 mg total) by mouth every morning. 07/08/21   Mar Daring, PA-C  vitamin B-12 (CYANOCOBALAMIN) 500 MCG tablet Take 1,000 mcg by mouth daily.    [provider]  zolpidem (AMBIEN) 5 MG tablet Take 1 tablet (5 mg total) by mouth at bedtime. 07/25/21   Love, Ivan Anchors, PA-C      Allergies    Iodine, Calan [verapamil], Cymbalta [duloxetine hcl], Iodinated contrast  media, Latex, Lisinopril, Neurontin [gabapentin], Zoloft [sertraline hcl], Valium [diazepam], Flomax [tamsulosin hcl], Glimepiride, Melatonin, and Tape    Review of Systems   Review of Systems  Neurological:  Positive for weakness.    Physical Exam Updated Vital Signs BP (!) 106/55   Pulse 81   Temp (!) 97.4 F (36.3 C) (Oral)   Resp 20   Ht _0  (1.854 m)   Wt 83.9 kg   SpO2 99%   BMI 24.40 kg/m  Physical Exam Vitals reviewed.  HENT:     Head: Atraumatic.  Eyes:     Pupils: Pupils are equal, round, and reactive to light.  Pulmonary:     Breath sounds: No wheezing.  Abdominal:     Tenderness: There is abdominal tenderness.     Comments: Mild lower abdominal tenderness without rebound or guarding.  No hernias palpated.  Musculoskeletal:     Cervical back: Neck supple.  Skin:    General: Skin is warm.  Neurological:     Mental Status: He is alert.     Comments: Awake and pleasant but some confusion.     ED Results / Procedures / Treatments   Labs (all labs ordered are listed, but only abnormal results are displayed) Labs Reviewed  BASIC METABOLIC PANEL - Abnormal; Notable for the following components:      Result Value   Glucose, Bld 139 (*)    BUN 27 (*)    All other components within normal limits  CBC - Abnormal; Notable for the following components:   WBC 12.6 (*)    RBC 3.68 (*)    Hemoglobin 10.5 (*)    HCT 33.3 (*)    All other components within normal limits  CBG MONITORING, ED - Abnormal; Notable for the following components:   Glucose-Capillary 141 (*)    All other components within normal limits  URINALYSIS, ROUTINE W REFLEX MICROSCOPIC  CBG MONITORING, ED    EKG EKG Interpretation  Date/Time:  Sunday June 15 2022 21:35:35 EDT Ventricular Rate:  90 PR Interval:  189 QRS Duration: 90 QT Interval:  373 QTC Calculation: 457 R Axis:   -51 Text Interpretation: Sinus rhythm Atrial premature complex Abnormal R-wave progression, late  transition Inferior infarct, old Confirmed by Davonna Belling [phone]) on 06/15/2022 11:16:31 PM  Radiology DG Chest Portable 1 View  Result Date: 06/15/2022 CLINICAL DATA:  Shortness of breath.  Weakness. EXAM: PORTABLE CHEST 1 VIEW COMPARISON:  Chest CTA 04/27/2022, chest radiograph 07/06/2021 FINDINGS: The heart is normal in size. Stable mediastinal tortuous [place]. No acute airspace disease. No pulmonary edema, pleural effusion, or pneumothorax. The patient's chin partially obscures the medial apices. Acute osseous findings. IMPRESSION: No acute chest findings.  Electronically Signed   By: Keith Rake M.D.   On: 06/15/2022 22:56    Procedures Procedures    Medications Ordered in ED Medications  sodium chloride 0.9 % bolus 500 mL (has no administration in time range)    ED Course/ Medical Decision Making/ A&P                           Medical Decision Making Amount and/or Complexity of Data Reviewed Labs: ordered. Radiology: ordered.   Patient brought in for mental status change.  Chest x-ray reassuring.  Blood work shows mildly elevated glucose and mildly elevated white count.  Had 200 cc in the bladder but difficulty urinating and was unable to pass the catheter.  Met resistance and had blood.  Will give fluid and hopefully he will be able to urinate.  We will also add on head CT.  Did have a mild hypoxia that is appeared to resolve.  Care will be turned over to Dr. Roxanne Mins.        Final Clinical Impression(s) / ED Diagnoses Final diagnoses:  Encephalopathy    Rx / DC Orders ED Discharge Orders     None         Davonna Belling, MD 06/15/22 2339

## 2022-06-15 NOTE — ED Triage Notes (Signed)
Pt arrived from home by [facility], daughter called EMS stating that pt has had weakness and has been retaining urine.

## 2022-06-15 NOTE — ED Notes (Signed)
Tried to do I&O on pt. met resistance. Couldn't get past prostate. Blood in tube and bag

## 2022-06-15 NOTE — ED Notes (Signed)
X-ray at bedside

## 2022-06-16 ENCOUNTER — Encounter (HOSPITAL_COMMUNITY): Payer: Self-pay | Admitting: Internal Medicine

## 2022-06-16 ENCOUNTER — Inpatient Hospital Stay (HOSPITAL_COMMUNITY): Payer: Medicare Other

## 2022-06-16 DIAGNOSIS — A419 Sepsis, unspecified organism: Secondary | ICD-10-CM | POA: Diagnosis present

## 2022-06-16 DIAGNOSIS — R6521 Severe sepsis with septic shock: Secondary | ICD-10-CM

## 2022-06-16 DIAGNOSIS — D72829 Elevated white blood cell count, unspecified: Secondary | ICD-10-CM

## 2022-06-16 DIAGNOSIS — Z20822 Contact with and (suspected) exposure to covid-19: Secondary | ICD-10-CM | POA: Diagnosis present

## 2022-06-16 DIAGNOSIS — N138 Other obstructive and reflux uropathy: Secondary | ICD-10-CM | POA: Diagnosis present

## 2022-06-16 DIAGNOSIS — C61 Malignant neoplasm of prostate: Secondary | ICD-10-CM | POA: Diagnosis present

## 2022-06-16 DIAGNOSIS — Z515 Encounter for palliative care: Secondary | ICD-10-CM

## 2022-06-16 DIAGNOSIS — F32A Depression, unspecified: Secondary | ICD-10-CM | POA: Diagnosis present

## 2022-06-16 DIAGNOSIS — I10 Essential (primary) hypertension: Secondary | ICD-10-CM

## 2022-06-16 DIAGNOSIS — R319 Hematuria, unspecified: Secondary | ICD-10-CM | POA: Diagnosis not present

## 2022-06-16 DIAGNOSIS — K219 Gastro-esophageal reflux disease without esophagitis: Secondary | ICD-10-CM | POA: Diagnosis not present

## 2022-06-16 DIAGNOSIS — N401 Enlarged prostate with lower urinary tract symptoms: Secondary | ICD-10-CM | POA: Diagnosis present

## 2022-06-16 DIAGNOSIS — R1313 Dysphagia, pharyngeal phase: Secondary | ICD-10-CM | POA: Diagnosis present

## 2022-06-16 DIAGNOSIS — D509 Iron deficiency anemia, unspecified: Secondary | ICD-10-CM | POA: Diagnosis present

## 2022-06-16 DIAGNOSIS — Z1624 Resistance to multiple antibiotics: Secondary | ICD-10-CM | POA: Diagnosis present

## 2022-06-16 DIAGNOSIS — I110 Hypertensive heart disease with heart failure: Secondary | ICD-10-CM | POA: Diagnosis present

## 2022-06-16 DIAGNOSIS — E872 Acidosis, unspecified: Secondary | ICD-10-CM | POA: Diagnosis present

## 2022-06-16 DIAGNOSIS — G9341 Metabolic encephalopathy: Secondary | ICD-10-CM | POA: Diagnosis present

## 2022-06-16 DIAGNOSIS — Z66 Do not resuscitate: Secondary | ICD-10-CM | POA: Diagnosis not present

## 2022-06-16 DIAGNOSIS — E1165 Type 2 diabetes mellitus with hyperglycemia: Secondary | ICD-10-CM | POA: Diagnosis present

## 2022-06-16 DIAGNOSIS — E041 Nontoxic single thyroid nodule: Secondary | ICD-10-CM | POA: Diagnosis present

## 2022-06-16 DIAGNOSIS — E1142 Type 2 diabetes mellitus with diabetic polyneuropathy: Secondary | ICD-10-CM | POA: Diagnosis present

## 2022-06-16 DIAGNOSIS — R531 Weakness: Secondary | ICD-10-CM | POA: Diagnosis present

## 2022-06-16 DIAGNOSIS — Z7189 Other specified counseling: Secondary | ICD-10-CM

## 2022-06-16 DIAGNOSIS — N390 Urinary tract infection, site not specified: Secondary | ICD-10-CM | POA: Diagnosis present

## 2022-06-16 DIAGNOSIS — J9811 Atelectasis: Secondary | ICD-10-CM | POA: Diagnosis present

## 2022-06-16 DIAGNOSIS — G9340 Encephalopathy, unspecified: Secondary | ICD-10-CM | POA: Diagnosis not present

## 2022-06-16 DIAGNOSIS — M353 Polymyalgia rheumatica: Secondary | ICD-10-CM | POA: Diagnosis present

## 2022-06-16 DIAGNOSIS — E782 Mixed hyperlipidemia: Secondary | ICD-10-CM

## 2022-06-16 DIAGNOSIS — C7951 Secondary malignant neoplasm of bone: Secondary | ICD-10-CM | POA: Diagnosis present

## 2022-06-16 DIAGNOSIS — I5032 Chronic diastolic (congestive) heart failure: Secondary | ICD-10-CM | POA: Diagnosis present

## 2022-06-16 DIAGNOSIS — D638 Anemia in other chronic diseases classified elsewhere: Secondary | ICD-10-CM | POA: Diagnosis present

## 2022-06-16 DIAGNOSIS — J690 Pneumonitis due to inhalation of food and vomit: Secondary | ICD-10-CM | POA: Diagnosis present

## 2022-06-16 LAB — LACTIC ACID, PLASMA
Lactic Acid, Venous: 5.1 mmol/L (ref 0.5–1.9)
Lactic Acid, Venous: 4.7 mmol/L (ref 0.5–1.9)
Lactic Acid, Venous: 5.4 mmol/L (ref 0.5–1.9)
Lactic Acid, Venous: 5.4 mmol/L (ref 0.5–1.9)

## 2022-06-16 LAB — URINALYSIS, ROUTINE W REFLEX MICROSCOPIC
Bacteria, UA: NONE SEEN
Bilirubin Urine: NEGATIVE
Glucose, UA: NEGATIVE mg/dL
Ketones, ur: 5 mg/dL — AB
Nitrite: POSITIVE — AB
Protein, ur: 100 mg/dL — AB
RBC / HPF: >50 RBC/hpf — ABNORMAL HIGH (ref 0–5)
Specific Gravity, Urine: 1.023 (ref 1.005–1.030)
WBC, UA: >50 WBC/hpf — ABNORMAL HIGH (ref 0–5)
pH: 6 (ref 5.0–8.0)

## 2022-06-16 LAB — RESP PANEL BY RT-PCR (FLU A&B, COVID) ARPGX2
Influenza A by PCR: NEGATIVE
Influenza B by PCR: NEGATIVE
SARS Coronavirus 2 by RT PCR: NEGATIVE

## 2022-06-16 LAB — HEPATIC FUNCTION PANEL
ALT: 9 U/L (ref 0–44)
AST: 15 U/L (ref 15–41)
Albumin: 2.6 g/dL — ABNORMAL LOW (ref 3.5–5.0)
Alkaline Phosphatase: 69 U/L (ref 38–126)
Bilirubin, Direct: 0.3 mg/dL — ABNORMAL HIGH (ref 0.0–0.2)
Indirect Bilirubin: 0.7 mg/dL (ref 0.3–0.9)
Total Bilirubin: 1 mg/dL (ref 0.3–1.2)
Total Protein: 5.9 g/dL — ABNORMAL LOW (ref 6.5–8.1)

## 2022-06-16 LAB — PROTIME-INR
INR: 1.3 — ABNORMAL HIGH (ref 0.8–1.2)
Prothrombin Time: 15.6 seconds — ABNORMAL HIGH (ref 11.4–15.2)

## 2022-06-16 LAB — APTT: aPTT: 29 seconds (ref 24–36)

## 2022-06-16 LAB — MRSA NEXT GEN BY PCR, NASAL: MRSA by PCR Next Gen: NOT DETECTED

## 2022-06-16 LAB — MAGNESIUM: Magnesium: 1.9 mg/dL (ref 1.7–2.4)

## 2022-06-16 LAB — PHOSPHORUS: Phosphorus: 3 mg/dL (ref 2.5–4.6)

## 2022-06-16 MED ORDER — CEFEPIME HCL 2 G IV SOLR
2.0000 g | Freq: Two times a day (BID) | INTRAVENOUS | Status: DC
Start: 2022-06-16 — End: 2022-06-17
  Administered 2022-06-16 – 2022-06-17 (×2): 2 g via INTRAVENOUS
  Filled 2022-06-16 (×2): qty 12.5

## 2022-06-16 MED ORDER — LACTATED RINGERS IV BOLUS (SEPSIS)
1000.0000 mL | Freq: Once | INTRAVENOUS | Status: AC
  Administered 2022-06-16: 1000 mL via INTRAVENOUS

## 2022-06-16 MED ORDER — NOREPINEPHRINE 4 MG/250ML-% IV SOLN
0.0000 ug/min | INTRAVENOUS | Status: DC
  Administered 2022-06-16: 12 ug/min via INTRAVENOUS
  Administered 2022-06-16: 20 ug/min via INTRAVENOUS
  Administered 2022-06-16: 18 ug/min via INTRAVENOUS
  Administered 2022-06-17: 4 ug/min via INTRAVENOUS
  Administered 2022-06-17: 6 ug/min via INTRAVENOUS
  Administered 2022-06-17: 12 ug/min via INTRAVENOUS
  Administered 2022-06-17: 2 ug/min via INTRAVENOUS
  Administered 2022-06-17: 8 ug/min via INTRAVENOUS
  Filled 2022-06-16 (×6): qty 250

## 2022-06-16 MED ORDER — VASOPRESSIN 20 UNITS/100 ML INFUSION FOR SHOCK
INTRAVENOUS | Status: AC
  Filled 2022-06-16: qty 100

## 2022-06-16 MED ORDER — PANTOPRAZOLE SODIUM 40 MG PO TBEC
40.0000 mg | DELAYED_RELEASE_TABLET | Freq: Every day | ORAL | Status: DC
  Administered 2022-06-18 – 2022-06-20 (×3): 40 mg via ORAL
  Filled 2022-06-16 (×4): qty 1

## 2022-06-16 MED ORDER — PANTOPRAZOLE SODIUM 20 MG PO TBEC
20.0000 mg | DELAYED_RELEASE_TABLET | Freq: Every day | ORAL | Status: DC
  Filled 2022-06-16 (×3): qty 1

## 2022-06-16 MED ORDER — FINASTERIDE 5 MG PO TABS
5.0000 mg | ORAL_TABLET | Freq: Every day | ORAL | Status: DC
  Administered 2022-06-18 – 2022-06-24 (×7): 5 mg via ORAL
  Filled 2022-06-16 (×7): qty 1

## 2022-06-16 MED ORDER — LACTATED RINGERS IV SOLN: INTRAVENOUS | Status: AC

## 2022-06-16 MED ORDER — ATORVASTATIN CALCIUM 40 MG PO TABS
40.0000 mg | ORAL_TABLET | Freq: Every day | ORAL | Status: DC
  Administered 2022-06-18 – 2022-06-24 (×7): 40 mg via ORAL
  Filled 2022-06-16 (×7): qty 1

## 2022-06-16 MED ORDER — VASOPRESSIN 20 UNITS/100 ML INFUSION FOR SHOCK
0.0000 [IU]/min | INTRAVENOUS | Status: DC
  Administered 2022-06-16: 0.03 [IU]/min via INTRAVENOUS
  Administered 2022-06-17: 0.01 [IU]/min via INTRAVENOUS
  Filled 2022-06-16: qty 100

## 2022-06-16 MED ORDER — CHLORHEXIDINE GLUCONATE CLOTH 2 % EX PADS
6.0000 | MEDICATED_PAD | Freq: Every day | CUTANEOUS | Status: DC
  Administered 2022-06-16 – 2022-06-22 (×6): 6 via TOPICAL

## 2022-06-16 MED ORDER — SODIUM CHLORIDE 0.9 % IV SOLN: 250.0000 mL | INTRAVENOUS | Status: DC

## 2022-06-16 MED ORDER — FLUCONAZOLE IN SODIUM CHLORIDE 200-0.9 MG/100ML-% IV SOLN
200.0000 mg | INTRAVENOUS | Status: DC
Start: 2022-06-16 — End: 2022-06-20
  Administered 2022-06-16 – 2022-06-20 (×5): 200 mg via INTRAVENOUS
  Filled 2022-06-16 (×7): qty 100

## 2022-06-16 MED ORDER — SODIUM CHLORIDE 0.9 % IV SOLN: 2.0000 g | Freq: Two times a day (BID) | INTRAVENOUS | Status: DC

## 2022-06-16 MED ORDER — ACETAMINOPHEN 325 MG PO TABS
650.0000 mg | ORAL_TABLET | Freq: Four times a day (QID) | ORAL | Status: DC | PRN
  Administered 2022-06-20 – 2022-06-24 (×2): 650 mg via ORAL
  Filled 2022-06-16 (×2): qty 2

## 2022-06-16 MED ORDER — SODIUM CHLORIDE 0.9 % IV BOLUS
1000.0000 mL | Freq: Once | INTRAVENOUS | Status: AC
Start: 2022-06-16 — End: 2022-06-16
  Administered 2022-06-16: 1000 mL via INTRAVENOUS

## 2022-06-16 MED ORDER — NOREPINEPHRINE 4 MG/250ML-% IV SOLN
2.0000 ug/min | INTRAVENOUS | Status: DC
  Administered 2022-06-16: 2 ug/min via INTRAVENOUS
  Filled 2022-06-16: qty 250

## 2022-06-16 MED ORDER — ACETAMINOPHEN 650 MG RE SUPP: 650.0000 mg | Freq: Four times a day (QID) | RECTAL | Status: DC | PRN

## 2022-06-16 MED ORDER — SODIUM CHLORIDE 0.9 % IV SOLN
2.0000 g | Freq: Once | INTRAVENOUS | Status: AC
  Administered 2022-06-16: 2 g via INTRAVENOUS
  Filled 2022-06-16: qty 12.5

## 2022-06-16 MED ORDER — ENOXAPARIN SODIUM 40 MG/0.4ML IJ SOSY
40.0000 mg | PREFILLED_SYRINGE | INTRAMUSCULAR | Status: DC
  Administered 2022-06-16 – 2022-06-23 (×8): 40 mg via SUBCUTANEOUS
  Filled 2022-06-16 (×8): qty 0.4

## 2022-06-16 NOTE — Consult Note (Signed)
[facility] Consult  Reason for Consult: Central line placement, Urosepsis Referring Physician: Dr. Malachi Carl  Chief Complaint   Weakness     HPI: Jared Bridges is a 88 y.o. male with history of prostate cancer and HTN, HLD, DM who comes in with confusion and weakness. He was found to have a UTI on urinalysis and presumed urosepsis. He was maxed on levophed through a peripheral IV and needed more central access.   Past Medical History:  Diagnosis Date   Anemia, iron deficiency    Chronic headaches    Migraines   Chronic kidney disease    Resolved   Depression    Diverticulitis    Essential hypertension    GI bleed    Dr. Laural Golden - 1998   Hypercholesteremia    Orthostatic hypotension    Peptic ulcer disease    Peripheral neuropathy    Polymyalgia rheumatica ([name]    Stroke ([place])    Residual trouble reading and writing   Syncope    Thyroid nodule    TIA (transient ischemic attack) 12/2017   Type 2 diabetes mellitus ([place])     Past Surgical History:  Procedure Laterality Date   APPENDECTOMY     as child   BACK SURGERY     2000 IDET SPINAL PROC   CHOLECYSTECTOMY N/A 04/07/2016   Procedure: CHOLECYSTECTOMY;  Surgeon: Aviva Signs, MD;  Location: [facility];  Service: General;  Laterality: N/A;   [place]   COLONOSCOPY N/A 01/07/2018   Procedure: COLONOSCOPY;  Surgeon: Rogene Houston, MD;  Location: [facility] SU[facility];  Service: Endoscopy;  Laterality: N/A;  1:00   CRYOTHERAPY     EYE SURGERY     right cataract with lens implant   FEMUR SURGERY     RT LEG        1953   HAND SURGERY     RIGHT   HERNIA REPAIR     1-right inguinal, 3- left inguinal   IRRIGATION AND DEBRIDEMENT KNEE Left 07/09/2015   Procedure: LEFT KNEE IRRIGATION AND DEBRIDEMENT WOUND CLOSURE;  Surgeon: Gaynelle Arabian, MD;  Location: [facility];  Service: Orthopedics;  Laterality: Left;   JOINT REPLACEMENT  02/28/2016   lt knee revision   KNEE DEBRIDEMENT      2016   LEFT   MENISCUS REPAIR     LEFT   POLYPECTOMY  01/07/2018   Procedure: POLYPECTOMY;  Surgeon: Rogene Houston, MD;  Location: [facility];  Service: Endoscopy;;  colon    SHOULDER OPEN ROTATOR CUFF REPAIR     RT SHOULDER   SPINAL FUSION     SPINAL FUSION     TONSILLECTOMY     TOTAL KNEE ARTHROPLASTY Left 05/28/2015   Procedure: LEFT TOTAL KNEE ARTHROPLASTY;  Surgeon: Gaynelle Arabian, MD;  Location: [facility];  Service: Orthopedics;  Laterality: Left;   TOTAL KNEE REVISION Left 02/28/2016   Procedure: LEFT TOTAL KNEE REVISION;  Surgeon: Leandrew Koyanagi, MD;  Location: [place];  Service: Orthopedics;  Laterality: Left;   TRIGGER FINGER RELEASE     RT HAND   1990S   UVULECTOMY  2004   TURBINATE,TONSIL,ADENOID    Family History  Problem Relation Age of Onset   Colon cancer Sister    Hypertension Other    Depression Brother    Depression Daughter     Social History   Tobacco Use   Smoking status: Former  Packs/day: 0.10    Years: 18.00    Total pack years: 1.80    Types: Cigarettes    Start date: 12/18/1958    Quit date: 12/01/1986    Years since quitting: 35.5   Smokeless tobacco: Never  Vaping Use   Vaping Use: Never used  Substance Use Topics   Alcohol use: No    Alcohol/week: 0.0 standard drinks of alcohol   Drug use: No    Medications: I have reviewed the patient's current medications.  Allergies  Allergen Reactions   Iodine Hives, Itching and Other (See Comments)    very allergic(per daughter), can pre-med with benadryl   Calan [Verapamil] Other (See Comments)    Weakness   Cymbalta [Duloxetine Hcl] Other (See Comments)    Makes patient have jerking motions.   Iodinated Contrast Media Hives, Itching and Other (See Comments)    Can pre-med with benadryl   Latex Other (See Comments)    Redness iritation   Lisinopril Other (See Comments)    Makes patient have jerking motions.   Neurontin [Gabapentin] Other (See Comments)    Makes patient have jerking  motions.   Zoloft [Sertraline Hcl] Diarrhea and Other (See Comments)    Makes patient have jerking motions.   Valium [Diazepam] Other (See Comments)    "Did not work"   Flomax [Tamsulosin Hcl] Other (See Comments)    Dizziness   Glimepiride Other (See Comments)    "Did not work"   Melatonin Nausea Only   Tape Other (See Comments)    Skin irritation     ROS:  A comprehensive review of systems was negative except for: Constitutional: positive for weakness Genitourinary: positive for UTI Neurological: positive for confusion  Blood pressure (!) 74/41, pulse 85, temperature 99.3 F (37.4 C), temperature source Axillary, resp. rate (!) 26, height '6\' 1"'$  (1.854 m), weight 83.9 kg, SpO2 98 %. Physical Exam Vitals reviewed.  HENT:     Mouth/Throat:     Mouth: Mucous membranes are moist.  Eyes:     Extraocular Movements: Extraocular movements intact.  Neck:     Comments: Stiff neck unable to move back and forth or side to side easily Cardiovascular:     Rate and Rhythm: Normal rate.  Pulmonary:     Effort: Pulmonary effort is normal.  Abdominal:     General: There is no distension.     Palpations: Abdomen is soft.     Tenderness: There is no abdominal tenderness.  Skin:    General: Skin is warm.  Neurological:     Mental Status: He is alert. He is disoriented.  Psychiatric:        Mood and Affect: Mood normal.     Results: Results for orders placed or performed during the hospital encounter of 06/15/22 (from the past 48 hour(s))  CBG monitoring, ED     Status: Abnormal   Collection Time: 06/15/22  9:29 PM  Result Value Ref Range   Glucose-Capillary 141 (H) 70 - 99 mg/dL    Comment: Glucose reference range applies only to samples taken after fasting for at least 8 hours.  Urinalysis, Routine w reflex microscopic     Status: Abnormal   Collection Time: 06/15/22  9:37 PM  Result Value Ref Range   Color, Urine YELLOW YELLOW   APPearance TURBID (A) CLEAR   Specific  Gravity, Urine 1.023 1.005 - 1.030   pH 6.0 5.0 - 8.0   Glucose, UA NEGATIVE NEGATIVE mg/dL   Hgb urine  dipstick MODERATE (A) NEGATIVE   Bilirubin Urine NEGATIVE NEGATIVE   Ketones, ur 5 (A) NEGATIVE mg/dL   Protein, ur 100 (A) NEGATIVE mg/dL   Nitrite POSITIVE (A) NEGATIVE   Leukocytes,Ua MODERATE (A) NEGATIVE   RBC / HPF >50 (H) 0 - 5 RBC/hpf   WBC, UA >50 (H) 0 - 5 WBC/hpf   Bacteria, UA NONE SEEN NONE SEEN   WBC Clumps PRESENT    Budding Yeast PRESENT     Comment: Performed at [facility], [address] [place], [place] [address]  Basic metabolic panel     Status: Abnormal   Collection Time: 06/15/22 10:13 PM  Result Value Ref Range   Sodium 137 135 - 145 mmol/L   Potassium 3.8 3.5 - 5.1 mmol/L   Chloride 106 98 - 111 mmol/L   CO2 24 22 - 32 mmol/L   Glucose, Bld 139 (H) 70 - 99 mg/dL    Comment: Glucose reference range applies only to samples taken after fasting for at least 8 hours.   BUN 27 (H) 8 - 23 mg/dL   Creatinine, Ser 1.15 0.61 - 1.24 mg/dL   Calcium 9.1 8.9 - 10.3 mg/dL   GFR, Estimated >60 >60 mL/min    Comment: (NOTE) Calculated using the CKD-EPI Creatinine Equation (2021)    Anion gap 7 5 - 15    Comment: Performed at [facility], [address], [place], [place] [address]  CBC     Status: Abnormal   Collection Time: 06/15/22 10:13 PM  Result Value Ref Range   WBC 12.6 (H) 4.0 - 10.5 K/uL   RBC 3.68 (L) 4.22 - 5.81 MIL/uL   Hemoglobin 10.5 (L) 13.0 - 17.0 g/dL   HCT 33.3 (L) 39.0 - 52.0 %   MCV 90.5 80.0 - 100.0 fL   MCH 28.5 26.0 - 34.0 pg   MCHC 31.5 30.0 - 36.0 g/dL   RDW 14.0 11.5 - 15.5 %   Platelets 238 150 - 400 K/uL   nRBC 0.0 0.0 - 0.2 %    Comment: Performed at [facility], [address] [place], [place] [address]  Resp Panel by RT-PCR (Flu A&B, Covid) Anterior Nasal Swab     Status: None   Collection Time: 06/16/22  3:48 AM   Specimen: Anterior Nasal Swab  Result Value Ref Range   SARS Coronavirus 2 by RT PCR NEGATIVE NEGATIVE     Comment: (NOTE) SARS-CoV-2 target nucleic acids are NOT DETECTED.  The SARS-CoV-2 RNA is generally detectable in upper respiratory specimens during the acute phase of infection. The lowest concentration of SARS-CoV-2 viral copies this assay can detect is 138 copies/mL. A negative result does not preclude SARS-Cov-2 infection and should not be used as the sole basis for treatment or other patient management decisions. A negative result may occur with  improper specimen collection/handling, submission of specimen other than nasopharyngeal swab, presence of viral mutation(s) within the areas targeted by this assay, and inadequate number of viral copies(<138 copies/mL). A negative result must be combined with clinical observations, patient history, and epidemiological information. The expected result is Negative.  Fact Sheet for Patients:  EntrepreneurPulse.com.au  Fact Sheet for Healthcare Providers:  IncredibleEmployment.be  This test is no t yet approved or cleared by the [place] FDA and  has been authorized for detection and/or diagnosis of SARS-CoV-2 by FDA under an Emergency Use Authorization (EUA). This EUA will remain  in effect (meaning this test can be used) for the duration of the  COVID-19 declaration under Section 564(b)(1) of the Act, 21 U.S.C.section 360bbb-3(b)(1), unless the authorization is terminated  or revoked sooner.       Influenza A by PCR NEGATIVE NEGATIVE   Influenza B by PCR NEGATIVE NEGATIVE    Comment: (NOTE) The Xpert Xpress SARS-CoV-2/FLU/RSV plus assay is intended as an aid in the diagnosis of influenza from Nasopharyngeal swab specimens and should not be used as a sole basis for treatment. Nasal washings and aspirates are unacceptable for Xpert Xpress SARS-CoV-2/FLU/RSV testing.  Fact Sheet for Patients: EntrepreneurPulse.com.au  Fact Sheet for Healthcare  Providers: IncredibleEmployment.be  This test is not yet approved or cleared by the [place] FDA and has been authorized for detection and/or diagnosis of SARS-CoV-2 by FDA under an Emergency Use Authorization (EUA). This EUA will remain in effect (meaning this test can be used) for the duration of the COVID-19 declaration under Section 564(b)(1) of the Act, 21 U.S.C. section 360bbb-3(b)(1), unless the authorization is terminated or revoked.  Performed at [facility], [address] [place], [place] [address]   Lactic acid, plasma     Status: Abnormal   Collection Time: 06/16/22  4:01 AM  Result Value Ref Range   Lactic Acid, Venous 5.1 (HH) 0.5 - 1.9 mmol/L    Comment: CRITICAL RESULT CALLED TO, READ BACK BY AND VERIFIED WITH: NICKOLS,K @ 6962 ON 06/16/22 BY JUW Performed at [facility], [address], [place], [place] [address]   Protime-INR     Status: Abnormal   Collection Time: 06/16/22  4:01 AM  Result Value Ref Range   Prothrombin Time 15.6 (H) 11.4 - 15.2 seconds   INR 1.3 (H) 0.8 - 1.2    Comment: (NOTE) INR goal varies based on device and disease states. Performed at [facility], [address], [place], [place] [address]   APTT     Status: None   Collection Time: 06/16/22  4:01 AM  Result Value Ref Range   aPTT 29 24 - 36 seconds    Comment: Performed at [facility], [address], [place], [place] [address]  Hepatic function panel     Status: Abnormal   Collection Time: 06/16/22  4:01 AM  Result Value Ref Range   Total Protein 5.9 (L) 6.5 - 8.1 g/dL   Albumin 2.6 (L) 3.5 - 5.0 g/dL   AST 15 15 - 41 U/L   ALT 9 0 - 44 U/L   Alkaline Phosphatase 69 38 - 126 U/L   Total Bilirubin 1.0 0.3 - 1.2 mg/dL   Bilirubin, Direct 0.3 (H) 0.0 - 0.2 mg/dL   Indirect Bilirubin 0.7 0.3 - 0.9 mg/dL    Comment: Performed at [facility], [address], [place], [place] [address]  Magnesium     Status: None   Collection Time: 06/16/22  4:01 AM  Result  Value Ref Range   Magnesium 1.9 1.7 - 2.4 mg/dL    Comment: Performed at [facility], [address], [place], [place] [address]  Phosphorus     Status: None   Collection Time: 06/16/22  4:01 AM  Result Value Ref Range   Phosphorus 3.0 2.5 - 4.6 mg/dL    Comment: Performed at [facility], [address] [place], [place] [address]  Lactic acid, plasma     Status: Abnormal   Collection Time: 06/16/22  5:29 AM  Result Value Ref Range   Lactic Acid, Venous 4.7 (HH) 0.5 - 1.9 mmol/L    Comment: CRITICAL VALUE NOTED.  VALUE  IS CONSISTENT WITH PREVIOUSLY REPORTED AND CALLED VALUE. Performed at [facility], [address] [place], [place] [address]   Lactic acid, plasma     Status: Abnormal   Collection Time: 06/16/22  7:52 AM  Result Value Ref Range   Lactic Acid, Venous 5.4 (HH) 0.5 - 1.9 mmol/L    Comment: CRITICAL VALUE NOTED.  VALUE IS CONSISTENT WITH PREVIOUSLY REPORTED AND CALLED VALUE. Performed at [facility], [address], [place], [place] [address]     CT Head Wo Contrast  Result Date: 06/16/2022 CLINICAL DATA:  Mental status change, unknown cause Weakness. EXAM: CT HEAD WITHOUT CONTRAST TECHNIQUE: Contiguous axial images were obtained from the base of the skull through the vertex without intravenous contrast. RADIATION DOSE REDUCTION: This exam was performed according to the departmental dose-optimization program which includes automated exposure control, adjustment of the mA and/or kV according to patient size and/or use of iterative reconstruction technique. COMPARISON:  Head CT 07/01/2021 FINDINGS: Brain: No intracranial hemorrhage, mass effect, or midline shift. No hydrocephalus. Stable degree of atrophy and chronic small vessel ischemic change. The basilar cisterns are patent. No evidence of territorial infarct or acute ischemia. No extra-axial or intracranial fluid collection. Vascular: Atherosclerosis of skullbase vasculature without hyperdense vessel or abnormal  calcification. Skull: No fracture or focal lesion. Sinuses/Orbits: Paranasal sinuses and mastoid air cells are clear. The visualized orbits are unremarkable. Bilateral cataract resection. Chronic defect of the right lamina papyracea. Other: None. IMPRESSION: 1. No acute intracranial abnormality. 2. Stable atrophy and chronic small vessel ischemic change. Electronically Signed   By: Keith Rake M.D.   On: 06/16/2022 01:00   DG Chest Portable 1 View  Result Date: 06/15/2022 CLINICAL DATA:  Shortness of breath.  Weakness. EXAM: PORTABLE CHEST 1 VIEW COMPARISON:  Chest CTA 04/27/2022, chest radiograph 07/06/2021 FINDINGS: The heart is normal in size. Stable mediastinal tortuous [place]. No acute airspace disease. No pulmonary edema, pleural effusion, or pneumothorax. The patient's chin partially obscures the medial apices. Acute osseous findings. IMPRESSION: No acute chest findings. Electronically Signed   By: Keith Rake M.D.   On: 06/15/2022 22:56     Assessment & Plan:  Jared Bridges is a 88 y.o. male with urosepsis, requiring levophed who needs central access. I discussed with his wife and daughter and discussed risk of bleeding, infection, injury to vessels, pneumothorax.    All questions were answered to the satisfaction of the patient and family.    Virl Cagey 06/16/2022, 11:45 AM

## 2022-06-16 NOTE — ED Provider Notes (Signed)
Care assumed from Dr. Alvino Chapel, patient with confusion and urinary retention pending urinalysis, CT of head.  CT of head showed no acute intracranial abnormality.  I have independently viewed the images, and agree with radiologist interpretation.  I have reviewed the urinalysis report and it is consistent with UTI with positive nitrite, greater than 50 RBCs, greater than 50 WBCs although no bacteria were seen.  At this point, patient has become tachycardic and hypotensive.  Code sepsis is activated.  I have ordered IV fluids, antibiotics.  I have reviewed and interpreted the repeat ECG, my interpretation is sinus tachycardia, PAC, old inferior wall myocardial infarction unchanged from prior except for rate increased.  I have reviewed and interpreted additional labs drawn for code sepsis.  My interpretation is elevated lactic acid level consistent with sepsis, hypoalbuminemia which is likely nutritional.  Patient's blood pressure did improve with fluid therapy.  Case is discussed with Dr. Josephine Cables of [facility], who agrees to admit the patient.  CRITICAL CARE Performed by: Delora Fuel Total critical care time: 50 minutes Critical care time was exclusive of separately billable procedures and treating other patients. Critical care was necessary to treat or prevent imminent or life-threatening deterioration. Critical care was time spent personally by me on the following activities: development of treatment plan with patient and/or surrogate as well as nursing, discussions with consultants, evaluation of patient's response to treatment, examination of patient, obtaining history from patient or surrogate, ordering and performing treatments and interventions, ordering and review of laboratory studies, ordering and review of radiographic studies, pulse oximetry and re-evaluation of patient's condition.  Results for orders placed or performed during the hospital encounter of 17/40/81  Basic metabolic  panel  Result Value Ref Range   Sodium 137 135 - 145 mmol/L   Potassium 3.8 3.5 - 5.1 mmol/L   Chloride 106 98 - 111 mmol/L   CO2 24 22 - 32 mmol/L   Glucose, Bld 139 (H) 70 - 99 mg/dL   BUN 27 (H) 8 - 23 mg/dL   Creatinine, Ser 1.15 0.61 - 1.24 mg/dL   Calcium 9.1 8.9 - 10.3 mg/dL   GFR, Estimated >60 >60 mL/min   Anion gap 7 5 - 15  CBC  Result Value Ref Range   WBC 12.6 (H) 4.0 - 10.5 K/uL   RBC 3.68 (L) 4.22 - 5.81 MIL/uL   Hemoglobin 10.5 (L) 13.0 - 17.0 g/dL   HCT 33.3 (L) 39.0 - 52.0 %   MCV 90.5 80.0 - 100.0 fL   MCH 28.5 26.0 - 34.0 pg   MCHC 31.5 30.0 - 36.0 g/dL   RDW 14.0 11.5 - 15.5 %   Platelets 238 150 - 400 K/uL   nRBC 0.0 0.0 - 0.2 %  Urinalysis, Routine w reflex microscopic  Result Value Ref Range   Color, Urine YELLOW YELLOW   APPearance TURBID (A) CLEAR   Specific Gravity, Urine 1.023 1.005 - 1.030   pH 6.0 5.0 - 8.0   Glucose, UA NEGATIVE NEGATIVE mg/dL   Hgb urine dipstick MODERATE (A) NEGATIVE   Bilirubin Urine NEGATIVE NEGATIVE   Ketones, ur 5 (A) NEGATIVE mg/dL   Protein, ur 100 (A) NEGATIVE mg/dL   Nitrite POSITIVE (A) NEGATIVE   Leukocytes,Ua MODERATE (A) NEGATIVE   RBC / HPF >50 (H) 0 - 5 RBC/hpf   WBC, UA >50 (H) 0 - 5 WBC/hpf   Bacteria, UA NONE SEEN NONE SEEN   WBC Clumps PRESENT    Budding Yeast PRESENT  Lactic acid, plasma  Result Value Ref Range   Lactic Acid, Venous 5.1 (HH) 0.5 - 1.9 mmol/L  Protime-INR  Result Value Ref Range   Prothrombin Time 15.6 (H) 11.4 - 15.2 seconds   INR 1.3 (H) 0.8 - 1.2  APTT  Result Value Ref Range   aPTT 29 24 - 36 seconds  Hepatic function panel  Result Value Ref Range   Total Protein 5.9 (L) 6.5 - 8.1 g/dL   Albumin 2.6 (L) 3.5 - 5.0 g/dL   AST 15 15 - 41 U/L   ALT 9 0 - 44 U/L   Alkaline Phosphatase 69 38 - 126 U/L   Total Bilirubin 1.0 0.3 - 1.2 mg/dL   Bilirubin, Direct 0.3 (H) 0.0 - 0.2 mg/dL   Indirect Bilirubin 0.7 0.3 - 0.9 mg/dL  CBG monitoring, ED  Result Value Ref Range    Glucose-Capillary 141 (H) 70 - 99 mg/dL   CT Head Wo Contrast  Result Date: 06/16/2022 CLINICAL DATA:  Mental status change, unknown cause Weakness. EXAM: CT HEAD WITHOUT CONTRAST TECHNIQUE: Contiguous axial images were obtained from the base of the skull through the vertex without intravenous contrast. RADIATION DOSE REDUCTION: This exam was performed according to the departmental dose-optimization program which includes automated exposure control, adjustment of the mA and/or kV according to patient size and/or use of iterative reconstruction technique. COMPARISON:  Head CT 07/01/2021 FINDINGS: Brain: No intracranial hemorrhage, mass effect, or midline shift. No hydrocephalus. Stable degree of atrophy and chronic small vessel ischemic change. The basilar cisterns are patent. No evidence of territorial infarct or acute ischemia. No extra-axial or intracranial fluid collection. Vascular: Atherosclerosis of skullbase vasculature without hyperdense vessel or abnormal calcification. Skull: No fracture or focal lesion. Sinuses/Orbits: Paranasal sinuses and mastoid air cells are clear. The visualized orbits are unremarkable. Bilateral cataract resection. Chronic defect of the right lamina papyracea. Other: None. IMPRESSION: 1. No acute intracranial abnormality. 2. Stable atrophy and chronic small vessel ischemic change. Electronically Signed   By: Keith Rake M.D.   On: 06/16/2022 01:00   DG Chest Portable 1 View  Result Date: 06/15/2022 CLINICAL DATA:  Shortness of breath.  Weakness. EXAM: PORTABLE CHEST 1 VIEW COMPARISON:  Chest CTA 04/27/2022, chest radiograph 07/06/2021 FINDINGS: The heart is normal in size. Stable mediastinal tortuous [place]. No acute airspace disease. No pulmonary edema, pleural effusion, or pneumothorax. The patient's chin partially obscures the medial apices. Acute osseous findings. IMPRESSION: No acute chest findings. Electronically Signed   By: Keith Rake M.D.   On: 92/92/4462  86:38       Delora Fuel, MD 17/71/16 [phone]

## 2022-06-16 NOTE — H&P (Signed)
History and Physical    Patient: Jared Bridges GYB:[id number] DOB: 1930/06/21 DOA: 06/15/2022 DOS: the patient was seen and examined on 06/16/2022 PCP: [name], MD  Patient coming from: Home  Chief Complaint:  Chief Complaint  Patient presents with   Weakness   HPI: Jared Bridges is a 88 y.o. male with medical history significant of hypertension, hyperlipidemia, T2DM, prostate cancer with osseous metastasis, BPH, hearing impairment, diabetic polyneuropathy who presents to the emergency department via EMS from home due to confusion and weakness.  Patient was unable to provide history, history was obtained from ED physician and ED medical record.  Per report, daughter activated EMS stating that patient has progressively become weaker and has been retaining urine.  ED Course:  In the emergency department, she was 97.70F, patient was tachycardic and tachypneic.  BP on arrival was 113/57.  Work-up in the ED showed leukocytosis, and normocytic anemia.  BMP was normal except for hyperglycemia and elevated BUN at 27.  Lactic acid 5.1 > 4.7, albumin 2.6, urinalysis was positive for UTI.  Influenza A, B, SARS coronavirus 2 was negative. CT head without contrast showed no acute intracranial abnormality Chest x-ray showed no acute chest findings Patient was treated with IV cefepime, IV hydration per sepsis protocol was provided, patient did not respond to fluids and was started on peripheral pressors (IV Levophed).  Hospitalist was asked to admit patient for further evaluation and management.  Review of Systems: Review of systems as noted in the HPI. All other systems reviewed and are negative.   Past Medical History:  Diagnosis Date   Anemia, iron deficiency    Chronic headaches    Migraines   Chronic kidney disease    Resolved   Depression    Diverticulitis    Essential hypertension    GI bleed    Dr. Laural Golden - 1998   Hypercholesteremia    Orthostatic hypotension    Peptic  ulcer disease    Peripheral neuropathy    Polymyalgia rheumatica ([name]    Stroke ([place])    Residual trouble reading and writing   Syncope    Thyroid nodule    TIA (transient ischemic attack) 12/2017   Type 2 diabetes mellitus ([place])    Past Surgical History:  Procedure Laterality Date   APPENDECTOMY     as child   BACK SURGERY     2000 IDET SPINAL PROC   CHOLECYSTECTOMY N/A 04/07/2016   Procedure: CHOLECYSTECTOMY;  Surgeon: Aviva Signs, MD;  Location: [facility];  Service: General;  Laterality: N/A;   [place]   COLONOSCOPY N/A 01/07/2018   Procedure: COLONOSCOPY;  Surgeon: Rogene Houston, MD;  Location: [facility];  Service: Endoscopy;  Laterality: N/A;  1:00   CRYOTHERAPY     EYE SURGERY     right cataract with lens implant   FEMUR SURGERY     RT LEG        1953   HAND SURGERY     RIGHT   HERNIA REPAIR     1-right inguinal, 3- left inguinal   IRRIGATION AND DEBRIDEMENT KNEE Left 07/09/2015   Procedure: LEFT KNEE IRRIGATION AND DEBRIDEMENT WOUND CLOSURE;  Surgeon: Gaynelle Arabian, MD;  Location: [facility];  Service: Orthopedics;  Laterality: Left;   JOINT REPLACEMENT  02/28/2016   lt knee revision   KNEE DEBRIDEMENT     2016   LEFT   MENISCUS REPAIR  LEFT   POLYPECTOMY  01/07/2018   Procedure: POLYPECTOMY;  Surgeon: Rogene Houston, MD;  Location: [facility];  Service: Endoscopy;;  colon    SHOULDER OPEN ROTATOR CUFF REPAIR     RT SHOULDER   SPINAL FUSION     SPINAL FUSION     TONSILLECTOMY     TOTAL KNEE ARTHROPLASTY Left 05/28/2015   Procedure: LEFT TOTAL KNEE ARTHROPLASTY;  Surgeon: Gaynelle Arabian, MD;  Location: [facility];  Service: Orthopedics;  Laterality: Left;   TOTAL KNEE REVISION Left 02/28/2016   Procedure: LEFT TOTAL KNEE REVISION;  Surgeon: Leandrew Koyanagi, MD;  Location: [place];  Service: Orthopedics;  Laterality: Left;   TRIGGER FINGER RELEASE     RT HAND   1990S   UVULECTOMY  2004   TURBINATE,TONSIL,ADENOID    Social  History:  reports that he quit smoking about 35 years ago. His smoking use included cigarettes. He started smoking about 63 years ago. He has a 1.80 pack-year smoking history. He has never used smokeless tobacco. He reports that he does not drink alcohol and does not use drugs.   Allergies  Allergen Reactions   Iodine Hives, Itching and Other (See Comments)    very allergic(per daughter), can pre-med with benadryl   Calan [Verapamil] Other (See Comments)    Weakness   Cymbalta [Duloxetine Hcl] Other (See Comments)    Makes patient have jerking motions.   Iodinated Contrast Media Hives, Itching and Other (See Comments)    Can pre-med with benadryl   Latex Other (See Comments)    Redness iritation   Lisinopril Other (See Comments)    Makes patient have jerking motions.   Neurontin [Gabapentin] Other (See Comments)    Makes patient have jerking motions.   Zoloft [Sertraline Hcl] Diarrhea and Other (See Comments)    Makes patient have jerking motions.   Valium [Diazepam] Other (See Comments)    "Did not work"   Flomax [Tamsulosin Hcl] Other (See Comments)    Dizziness   Glimepiride Other (See Comments)    "Did not work"   Melatonin Nausea Only   Tape Other (See Comments)    Skin irritation    Family History  Problem Relation Age of Onset   Colon cancer Sister    Hypertension Other    Depression Brother    Depression Daughter      Prior to Admission medications   Medication Sig Start Date End Date Taking? Authorizing Provider  acetaminophen (TYLENOL) 325 MG tablet Take 2 tablets (650 mg total) by mouth 3 (three) times daily. 07/25/21   Love, Ivan Anchors, PA-C  alfuzosin (UROXATRAL) 10 MG 24 hr tablet Take 10 mg by mouth every evening. 11/15/20   [provider]  Apremilast (OTEZLA) 30 MG TABS Take 1 tablet by mouth 2 (two) times daily. 01/03/22   [provider]  atorvastatin (LIPITOR) 40 MG tablet Take 1 tablet (40 mg total) by mouth daily. 07/08/21   Mar Daring, PA-C  Cholecalciferol (VITAMIN D3) 250 MCG (10000 UT) TABS Take 1,000 Units by mouth daily.    [provider]  Ciclopirox 0.77 % gel ciclopirox 0.77 % topical gel  APPLY TO THE AFFECTED AREA(S) TWICE DAILY Patient not taking: Reported on 03/04/2022    [provider]  diclofenac sodium (VOLTAREN) 1 % GEL Apply 2 g topically daily as needed (for pain). 11/22/15   Dohmeier, Asencion Partridge, MD  enzalutamide Gillermina Phy) 80 MG tablet Take 80 mg by mouth daily. 11/05/20  [provider]  escitalopram (LEXAPRO) 20 MG tablet Take 20 mg by mouth daily. 08/23/21   [provider]  ferrous sulfate 325 (65 FE) MG tablet Take 325 mg by mouth daily with breakfast.    [provider]  finasteride (PROSCAR) 5 MG tablet Take 1 tablet (5 mg total) by mouth daily. 04/18/20   McKenzie, Candee Furbish, MD  fluocinonide cream (LIDEX) 7.79 % Apply 1 application topically daily as needed (for irritation). Patient not taking: Reported on 03/04/2022    [provider]  IBUPROFEN PO Take by mouth. prn    [provider]  mirabegron ER (MYRBETRIQ) 50 MG TB24 tablet Myrbetriq 50 mg tablet,extended release  TAKE 1 TABLET BY MOUTH DAILY    [provider]  OVER THE COUNTER MEDICATION Calcium 469m twice a day    [provider]  OVER THE COUNTER MEDICATION Excedrin migraine as needed    [provider]  pantoprazole (PROTONIX) 20 MG tablet Take 1 tablet (20 mg total) by mouth daily. 09/03/21   Rehman, NMechele Dawley MD  pregabalin (LYRICA) 75 MG capsule Take 75 mg by mouth 2 (two) times daily.    [provider]  senna-docusate (SENOKOT-S) 8.6-50 MG tablet Take 1 tablet by mouth 2 (two) times daily. Patient not taking: Reported on 03/04/2022 07/05/21   JDomenic Polite MD  topiramate (TOPAMAX) 50 MG tablet Take 50 mg by mouth 2 (two) times daily.    [provider]  venlafaxine XR (EFFEXOR-XR) 37.5 MG 24 hr capsule Take 1 capsule (37.5  mg total) by mouth every morning. 07/08/21   BMar Daring PA-C  vitamin B-12 (CYANOCOBALAMIN) 500 MCG tablet Take 1,000 mcg by mouth daily.    [provider]  zolpidem (AMBIEN) 5 MG tablet Take 1 tablet (5 mg total) by mouth at bedtime. 07/25/21   LBary Leriche PA-C    Physical Exam: BP (!) 87/35   Pulse 90   Temp 99.3 F (37.4 C) (Oral)   Resp (!) 27   Ht _0  (1.854 m)   Wt 83.9 kg   SpO2 94%   BMI 24.40 kg/m   General: 88y.o. year-old male well developed well nourished in no acute distress.   HEENT: NCAT, EOMI Neck: Supple, trachea medial Cardiovascular: Tachycardia.  Regular rate and rhythm with no rubs or gallops.  No thyromegaly or JVD noted.  No lower extremity edema. 2/4 pulses in all 4 extremities. Respiratory: Tachypnea. Clear to auscultation with no wheezes or rales. Good inspiratory effort. Abdomen: Soft, nontender nondistended with normal bowel sounds x4 quadrants. Muskuloskeletal: No cyanosis, clubbing or edema noted bilaterally Neuro: Awake, alert but confused.  Sensation, reflexes intact Skin: No ulcerative lesions noted or rashes Psychiatry: Mood is appropriate for condition and setting          Labs on Admission:  Basic Metabolic Panel: Recent Labs  Lab 06/15/22 2213 06/16/22 0401  NA 137  --   K 3.8  --   CL 106  --   CO2 24  --   GLUCOSE 139*  --   BUN 27*  --   CREATININE 1.15  --   CALCIUM 9.1  --   MG  --  1.9  PHOS  --  3.0   Liver Function Tests: Recent Labs  Lab 06/16/22 0401  AST 15  ALT 9  ALKPHOS 69  BILITOT 1.0  PROT 5.9*  ALBUMIN 2.6*   No results for input(s): "LIPASE", "AMYLASE" in the last  168 hours. No results for input(s): "AMMONIA" in the last 168 hours. CBC: Recent Labs  Lab 06/15/22 2213  WBC 12.6*  HGB 10.5*  HCT 33.3*  MCV 90.5  PLT 238   Cardiac Enzymes: No results for input(s): "CKTOTAL", "CKMB", "CKMBINDEX", "TROPONINI" in the last 168 hours.  BNP (last 3 results) No results for  input(s): "BNP" in the last 8760 hours.  ProBNP (last 3 results) No results for input(s): "PROBNP" in the last 8760 hours.  CBG: Recent Labs  Lab 06/15/22 2129  GLUCAP 141*    Radiological Exams on Admission: CT Head Wo Contrast  Result Date: 06/16/2022 CLINICAL DATA:  Mental status change, unknown cause Weakness. EXAM: CT HEAD WITHOUT CONTRAST TECHNIQUE: Contiguous axial images were obtained from the base of the skull through the vertex without intravenous contrast. RADIATION DOSE REDUCTION: This exam was performed according to the departmental dose-optimization program which includes automated exposure control, adjustment of the mA and/or kV according to patient size and/or use of iterative reconstruction technique. COMPARISON:  Head CT 07/01/2021 FINDINGS: Brain: No intracranial hemorrhage, mass effect, or midline shift. No hydrocephalus. Stable degree of atrophy and chronic small vessel ischemic change. The basilar cisterns are patent. No evidence of territorial infarct or acute ischemia. No extra-axial or intracranial fluid collection. Vascular: Atherosclerosis of skullbase vasculature without hyperdense vessel or abnormal calcification. Skull: No fracture or focal lesion. Sinuses/Orbits: Paranasal sinuses and mastoid air cells are clear. The visualized orbits are unremarkable. Bilateral cataract resection. Chronic defect of the right lamina papyracea. Other: None. IMPRESSION: 1. No acute intracranial abnormality. 2. Stable atrophy and chronic small vessel ischemic change. Electronically Signed   By: Keith Rake M.D.   On: 06/16/2022 01:00   DG Chest Portable 1 View  Result Date: 06/15/2022 CLINICAL DATA:  Shortness of breath.  Weakness. EXAM: PORTABLE CHEST 1 VIEW COMPARISON:  Chest CTA 04/27/2022, chest radiograph 07/06/2021 FINDINGS: The heart is normal in size. Stable mediastinal tortuous [place]. No acute airspace disease. No pulmonary edema, pleural effusion, or pneumothorax. The  patient's chin partially obscures the medial apices. Acute osseous findings. IMPRESSION: No acute chest findings. Electronically Signed   By: Keith Rake M.D.   On: 06/15/2022 22:56    EKG: I independently viewed the EKG done and my findings are as followed: Sinus tachycardia at a rate of 110 bpm with APCs  Assessment/Plan Present on Admission:  Leukocytosis  Essential hypertension  Benign prostatic hyperplasia with urinary obstruction  Active Problems:   Leukocytosis   Essential hypertension   Benign prostatic hyperplasia with urinary obstruction   Septic shock ([name]   Lactic acidosis   Mixed hyperlipidemia   Chronic diastolic CHF (congestive heart failure) ([name]   GERD (gastroesophageal reflux disease)  Septic shock secondary to UTI POA Patient presents with tachypnea, tachycardia, leukocytosis (met SIRS criteria), lactic acid was 5.1 (> 4.0) and patient did not respond to fluid bolus and requiring pressors to maintain normal BP (met criteria for septic shock) Continue IV cefepime Continue IV hydration Continue Tylenol as needed for fever Continue peripheral IV Levophed to maintain MAP of 65 and above and consider weaning patient off this as tolerated.  Leukocytosis possibly secondary to above WBC 12.6, continue treatment as described above  Lactic acidosis possibly secondary to septic shock Lactic acid 5.1 > 4.7, continue IV hydration Continue to trend lactic acid.  Essential hypertension Patient was not on any BP meds per med rec BP meds will be held at this time due to hypotension  Mixed hyperlipidemia  Continue Lipitor  T2DM Hemoglobin A1c 11 months ago was 6.4 indicating excellent control of glucose level Patient was not on any medication for diabetes per med rec Continue diet and lifestyle modification  GERD Continue Protonix  Chronic diastolic CHF Last 2D echo done on 12/29/2017 revealed LVEF 65% and grade 1 diastolic dysfunction Hypovolemic on  exam Continue to monitor intake and output, daily weights   BPH Continue finasteride Monitor urine output   DVT prophylaxis: Lovenox  Code Status: Full code Patient's daughter told RN over the phone that patient is a full code.  At bedside, patient does not want to be a DNR and states that daughter will confirm his CODE STATUS when she comes to visit him in the hospital.  RN was witness at bedside.  Consults: None  Family Communication: None at bedside  Severity of Illness: The appropriate patient status for this patient is INPATIENT. Inpatient status is judged to be reasonable and necessary in order to provide the required intensity of service to ensure the patient's safety. The patient's presenting symptoms, physical exam findings, and initial radiographic and laboratory data in the context of their chronic comorbidities is felt to place them at high risk for further clinical deterioration. Furthermore, it is not anticipated that the patient will be medically stable for discharge from the hospital within 2 midnights of admission.   * I certify that at the point of admission it is my clinical judgment that the patient will require inpatient hospital care spanning beyond 2 midnights from the point of admission due to high intensity of service, high risk for further deterioration and high frequency of surveillance required.*  Author: Bernadette Hoit, DO 06/16/2022 7:34 AM  For on call review [email or web address].

## 2022-06-16 NOTE — ED Notes (Signed)
Patient back from CT.

## 2022-06-16 NOTE — Consult Note (Signed)
Consultation Note Date: 06/16/2022   Patient Name: Jared Bridges  DOB: 11-Jan-1930  MRN: [id number]  Age / Sex: 88 y.o., male  PCP: [name], MD Referring Physician: Roxan Hockey, MD  Reason for Consultation: Establishing goals of care  HPI/Patient Profile: 88 y.o. male  with past medical history of prostate cancer with bone mets (follows at [facility] and on [place]), h/o fall with C7 SCI and stay in [facility] Aug 2022, wheelchair bound, stroke, migraines, HTN, HLD, orthostatic hypotension, polymalgia rheumatica, diabetes admitted on 06/15/2022 with confusion and weakness with UTI sepsis.   Clinical Assessment and Goals of Care: I met today at Jared Bridges bedside after reviewing Epic records from hospitalization and previous records from [place]. I reviewed diagnostics including lactate, BMET, UA. Discussed with bedside RN also.   I had a long conversation at Jared Bridges bedside with wife (she has some memory issues) and daughter Jared Bridges. It is really just the three of them. Jared Bridges shares about her father and caring for him after his C7 SCI. She originally attempted to have private caregivers but they were unreliable so she has been caring for her father herself. She is struggling with signs that she may have missed to get him help before he became so sick and septic. I praised her for the care she has provided knowing that he is wheelchair bound and how well he has done since Aug 2022. They found him a mechanical wheelchair that he could control himself and changed their home so that he can get around and out on the porch (where he enjoys his cigars). 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   We had a long and honest conversation regarding poor prognosis after  diagnosis with sepsis. Jared Bridges was honest about her thoughts of her father's poor prognosis and how she was afraid she would get a call saying he was gone. I was very clear that we are not yet out of the woods. We discussed her fear of her father suffering more than anything. She talks of him being able to go to heaven when it is his time. We discussed code status and DNR in the context of poor outcomes and suffering it would cause. After much deliberation they have decided to put in place DNR but continue to fully treat to give him every opportunity to come through this illness and ultimately return home.   All questions/concerns addressed. Emotional support provided.   Primary Decision Maker HCPOA daughter Jared Bridges    SUMMARY OF RECOMMENDATIONS   - DNR decided - Continue full treatment otherwise - Minimize suffering  Code Status/Advance Care Planning: DNR   Symptom Management:  Per attending.   Palliative Prophylaxis:  Aspiration, Bowel Regimen, Delirium Protocol, Frequent Pain Assessment, and Turn Reposition  Prognosis:  Overall prognosis poor - discussed with family.   Discharge Planning: To  Be Determined      Primary Diagnoses: Present on Admission:  Leukocytosis  Essential hypertension  Benign prostatic hyperplasia with urinary obstruction   I have reviewed the medical record, interviewed the patient and family, and examined the patient. The following aspects are pertinent.  Past Medical History:  Diagnosis Date   Anemia, iron deficiency    Chronic headaches    Migraines   Chronic kidney disease    Resolved   Depression    Diverticulitis    Essential hypertension    GI bleed    Dr. Laural Golden - 1998   Hypercholesteremia    Orthostatic hypotension    Peptic ulcer disease    Peripheral neuropathy    Polymyalgia rheumatica ([name]    Stroke ([name]    Residual trouble reading and writing   Syncope    Thyroid nodule    TIA (transient ischemic attack) 12/2017   Type 2  diabetes mellitus ([place])    Social History   Socioeconomic History   Marital status: Married    Spouse name: Not on file   Number of children: Not on file   Years of education: Not on file   Highest education level: Not on file  Occupational History   Not on file  Tobacco Use   Smoking status: Former    Packs/day: 0.10    Years: 18.00    Total pack years: 1.80    Types: Cigarettes    Start date: 12/18/1958    Quit date: 12/01/1986    Years since quitting: 35.5   Smokeless tobacco: Never  Vaping Use   Vaping Use: Never used  Substance and Sexual Activity   Alcohol use: No    Alcohol/week: 0.0 standard drinks of alcohol   Drug use: No   Sexual activity: Never  Other Topics Concern   Not on file  Social History Narrative   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   Social Determinants of Health   Financial Resource Strain: Not on file  Food Insecurity: Not on file  Transportation Needs: Not on file  Physical Activity: Not on file  Stress: Not on file  Social Connections: Not on file   Family History  Problem Relation Age of Onset   Colon cancer Sister    Hypertension Other    Depression Brother    Depression Daughter    Scheduled Meds:  atorvastatin  40 mg Oral Daily   Chlorhexidine Gluconate Cloth  6 each Topical Q0600   enoxaparin (LOVENOX) injection  40 mg Subcutaneous Q24H   finasteride  5 mg Oral Daily   pantoprazole  40 mg Oral Daily   Continuous Infusions:  sodium chloride Stopped (06/16/22 0730)   ceFEPime (MAXIPIME) IV 2 g (06/16/22 1509)   fluconazole (DIFLUCAN) IV Stopped (06/16/22 1104)   lactated ringers 150 mL/hr at 06/16/22 1514   norepinephrine (LEVOPHED) Adult infusion 23 mcg/min (06/16/22 1514)   PRN Meds:.acetaminophen **OR** acetaminophen Allergies  Allergen Reactions   Iodine Hives, Itching and Other (See Comments)    very allergic(per daughter), can pre-med with benadryl   Calan [Verapamil] Other (See Comments)    Weakness    Cymbalta [Duloxetine Hcl] Other (See Comments)    Makes patient have jerking motions.   Iodinated Contrast Media Hives, Itching and Other (See Comments)    Can pre-med with benadryl   Latex Other (See Comments)    Redness iritation   Lisinopril Other (See Comments)    Makes patient have jerking motions.   Neurontin [Gabapentin]  Other (See Comments)    Makes patient have jerking motions.   Zoloft [Sertraline Hcl] Diarrhea and Other (See Comments)    Makes patient have jerking motions.   Valium [Diazepam] Other (See Comments)    "Did not work"   Flomax [Tamsulosin Hcl] Other (See Comments)    Dizziness   Glimepiride Other (See Comments)    "Did not work"   Melatonin Nausea Only   Tape Other (See Comments)    Skin irritation   Review of Systems  Unable to perform ROS: Acuity of condition    Physical Exam Vitals and nursing note reviewed.  Constitutional:      General: He is not in acute distress.    Appearance: He is ill-appearing.     Comments: Beginning to arouse and interact more towards the end of my visit  Cardiovascular:     Rate and Rhythm: Normal rate.  Pulmonary:     Effort: Tachypnea present. No accessory muscle usage or respiratory distress.  Abdominal:     Palpations: Abdomen is soft.  Neurological:     Mental Status: He is lethargic and disoriented.     Vital Signs: BP (!) 113/46   Pulse 95   Temp 99.3 F (37.4 C) (Axillary)   Resp (!) 31   Ht '6\' 1"'  (1.854 m)   Wt 83.9 kg   SpO2 99%   BMI 24.40 kg/m  Pain Scale: PAINAD   Pain Score: 0-No pain   SpO2: SpO2: 99 % O2 Device:SpO2: 99 % O2 Flow Rate: .O2 Flow Rate (L/min): 2 L/min  IO: Intake/output summary:  Intake/Output Summary (Last 24 hours) at 06/16/2022 1522 Last data filed at 06/16/2022 1514 Gross per 24 hour  Intake 6135.37 ml  Output 100 ml  Net 6035.37 ml    LBM: Last BM Date : 06/15/22 Baseline Weight: Weight: 83.9 kg Most recent weight: Weight: 83.9 kg     Palliative  Assessment/Data:     Time In: 1610  Time Total: 80 min  Greater than 50%  of this time was spent counseling and coordinating care related to the above assessment and plan.  Signed by: Vinie Sill, NP Palliative Medicine Team Pager # [phone] (M-F 8a-5p) Team Phone # [phone] (Nights/Weekends)

## 2022-06-16 NOTE — ED Notes (Signed)
Patient transported to CT 

## 2022-06-16 NOTE — Progress Notes (Signed)
Patient seen and evaluated, chart reviewed, please see EMR for updated orders. Please see full H&P dictated by admitting physician Dr Josephine Cables for same date of service.    Brief Summary:-  88 y.o. male with medical history significant of hypertension, hyperlipidemia, T2DM, prostate cancer with osseous metastasis, BPH, hearing impairment, diabetic polyneuropathy admitted on 06/16/2022 with septic shock  A/p 1) shock from presumed urinary source--POA -Patient met sepsis criteria on admission -Lactic acidosis and leukocytosis noted -Despite Levophed hemodynamically unstable will add vasopressin -Continue IV fluids and IV cefepime  2) history of urinary retention/BPH with LUTS--- unable to void, given hemodynamic instability and the need to monitor urine output accurately Foley catheter/coud catheter placed on 06/16/2022 with 400 mL of urine removed --- Hold off on Flomax due to hypotension  3) social/ethics--- plan of care discussed with patient's daughter and wife -Patient is a DNR/DNI without limitations to treatment at this time  CRITICAL CARE Performed by: Roxan Hockey   Total critical care time: 47 minutes  Critical care time was exclusive of separately billable procedures and treating other patients.  -Septic shock with persistent hemodynamic instability despite IV fluids and Levophed, IV vasopressin added  Critical care was necessary to treat or prevent imminent or life-threatening deterioration.  Critical care was time spent personally by me on the following activities: development of treatment plan with patient and/or surrogate as well as nursing, discussions with consultants, evaluation of patient's response to treatment, examination of patient, obtaining history from patient or surrogate, ordering and performing treatments and interventions, ordering and review of laboratory studies, ordering and review of radiographic studies, pulse oximetry and re-evaluation of patient's  condition.

## 2022-06-16 NOTE — Sepsis Progress Note (Signed)
Elink following code sepsis °

## 2022-06-16 NOTE — Progress Notes (Signed)
[facility]  CXR with line in good position, no ptx.  Curlene Labrum, MD [facility] [address] [place], [place] [address] [phone] (office)

## 2022-06-16 NOTE — Procedures (Signed)
Procedure Note  06/16/22    Preoperative Diagnosis: Urosepsis, septic shock, congestive heart failure    Postoperative Diagnosis: Same   Procedure(s) Performed: Central Line placement, right subclavian vein   Surgeon: Lanell Matar. Constance Haw, MD   Assistants: None   Anesthesia: 1% lidocaine    Complications: None    Indications: Mr. Cull is a 88 y.o. with urosepsis on pressors and congestive heart failure needing more pressors. I discussed the risk and benefits of placement of the central line with his wife and daughter, including but not limited to bleeding, infection, and risk of pneumothorax. The family has given consent for the procedure.    Procedure: The patient placed supine. The right chest and neck was prepped and draped in the usual sterile fashion.  Wearing full gown and gloves, I performed the procedure.  One percent lidocaine was used for local anesthesia. An ultrasound was utilized to assess the jugular vein.  The needle with syringe was advanced into the subclavian vein with dark venous return, and a wire was placed using the Seldinger technique without difficulty.  Ectopia was noted and the wire was pulled back.  The skin was knicked and a dilator was placed, and the three lumen catheter was placed over the wire with continued control of the wire.  There was good draw back of blood from all three lumens and each flushed easily with saline.  The catheter was secured in 4 points with 2-0 silk and a biopatch and dressing was placed.     The patient tolerated the procedure well, and the CXR was ordered to confirm position of the central line.   Curlene Labrum, MD [facility] [address] [place], [place] [address] [phone] (office)

## 2022-06-16 NOTE — Progress Notes (Signed)
Pt failed bedside [facility] swallow screen. Will hold oral meds and make NPO until speech sees patient.  Pt is also on the maximum dosage of peripheral IV Levophed. Awaiting family decision of furthering care and their choice of possible transfer. Will continue to monitor.

## 2022-06-16 NOTE — ED Notes (Signed)
Pt respirations and hr has increased. Pt has congested cough. Pt instructed to try to cough up phlegm MD notified

## 2022-06-16 NOTE — Plan of Care (Signed)

## 2022-06-16 NOTE — Progress Notes (Signed)
Pharmacy Antibiotic Note  Jared Bridges is a 88 y.o. male admitted on 06/15/2022 with sepsis and UTI.  Pharmacy has been consulted for cefepime dosing. Patient with urinary retention, urinalysis with positive nitrite. Pt now tachycardic and hypotensive. Elevated WBC, afebrile.  Plan: Cefepime 2 grams IV every 12 hours Monitor clinical progress, cultures/sensitivities, renal function, abx plan   Height: '6\' 1"'$  (185.4 cm) Weight: 83.9 kg (184 lb 15.5 oz) IBW/kg (Calculated) : 79.9  Temp (24hrs), Avg:97.4 F (36.3 C), Min:97.4 F (36.3 C), Max:97.4 F (36.3 C)  Recent Labs  Lab 06/15/22 2213  WBC 12.6*  CREATININE 1.15    Estimated Creatinine Clearance: 48.2 mL/min (by C-G formula based on SCr of 1.15 mg/dL).    Allergies  Allergen Reactions   Iodine Hives, Itching and Other (See Comments)    very allergic(per daughter), can pre-med with benadryl   Calan [Verapamil] Other (See Comments)    Weakness   Cymbalta [Duloxetine Hcl] Other (See Comments)    Makes patient have jerking motions.   Iodinated Contrast Media Hives, Itching and Other (See Comments)    Can pre-med with benadryl   Latex Other (See Comments)    Redness iritation   Lisinopril Other (See Comments)    Makes patient have jerking motions.   Neurontin [Gabapentin] Other (See Comments)    Makes patient have jerking motions.   Zoloft [Sertraline Hcl] Diarrhea and Other (See Comments)    Makes patient have jerking motions.   Valium [Diazepam] Other (See Comments)    "Did not work"   Flomax [Tamsulosin Hcl] Other (See Comments)    Dizziness   Glimepiride Other (See Comments)    "Did not work"   Melatonin Nausea Only   Tape Other (See Comments)    Skin irritation    Antimicrobials this admission: 7/17 cefepime >>    Dose adjustments this admission:   Microbiology results: 7/16 BCx: sent 7/16 UCx: pending     Thank you for allowing [name] to participate in this patients care. Jens Som,  PharmD 06/16/2022 3:57 AM  **Pharmacist phone directory can be found on [email or web address] listed under [place]**

## 2022-06-16 NOTE — Progress Notes (Signed)
Copy of Healthcare POA paperwork placed in chart.

## 2022-06-17 ENCOUNTER — Inpatient Hospital Stay (HOSPITAL_COMMUNITY): Payer: Medicare Other

## 2022-06-17 DIAGNOSIS — Z515 Encounter for palliative care: Secondary | ICD-10-CM | POA: Diagnosis not present

## 2022-06-17 DIAGNOSIS — Z7189 Other specified counseling: Secondary | ICD-10-CM | POA: Diagnosis not present

## 2022-06-17 DIAGNOSIS — A419 Sepsis, unspecified organism: Secondary | ICD-10-CM | POA: Diagnosis not present

## 2022-06-17 DIAGNOSIS — R6521 Severe sepsis with septic shock: Secondary | ICD-10-CM | POA: Diagnosis not present

## 2022-06-17 LAB — COMPREHENSIVE METABOLIC PANEL
ALT: 16 U/L (ref 0–44)
AST: 33 U/L (ref 15–41)
Albumin: 2.1 g/dL — ABNORMAL LOW (ref 3.5–5.0)
Alkaline Phosphatase: 47 U/L (ref 38–126)
Anion gap: 6 (ref 5–15)
BUN: 32 mg/dL — ABNORMAL HIGH (ref 8–23)
CO2: 21 mmol/L — ABNORMAL LOW (ref 22–32)
Calcium: 8.1 mg/dL — ABNORMAL LOW (ref 8.9–10.3)
Chloride: 109 mmol/L (ref 98–111)
Creatinine, Ser: 1.24 mg/dL (ref 0.61–1.24)
GFR, Estimated: 55 mL/min — ABNORMAL LOW (ref >60)
Glucose, Bld: 126 mg/dL — ABNORMAL HIGH (ref 70–99)
Potassium: 3.9 mmol/L (ref 3.5–5.1)
Sodium: 136 mmol/L (ref 135–145)
Total Bilirubin: 0.6 mg/dL (ref 0.3–1.2)
Total Protein: 5 g/dL — ABNORMAL LOW (ref 6.5–8.1)

## 2022-06-17 LAB — BLOOD CULTURE ID PANEL (REFLEXED) - BCID2

## 2022-06-17 LAB — LACTIC ACID, PLASMA: Lactic Acid, Venous: 2 mmol/L (ref 0.5–1.9)

## 2022-06-17 LAB — CBC
HCT: 27.8 % — ABNORMAL LOW (ref 39.0–52.0)
Hemoglobin: 8.7 g/dL — ABNORMAL LOW (ref 13.0–17.0)
MCH: 28.8 pg (ref 26.0–34.0)
MCHC: 31.3 g/dL (ref 30.0–36.0)
MCV: 92.1 fL (ref 80.0–100.0)
Platelets: 158 10*3/uL (ref 150–400)
RBC: 3.02 MIL/uL — ABNORMAL LOW (ref 4.22–5.81)
RDW: 14.5 % (ref 11.5–15.5)
WBC: 39.7 10*3/uL — ABNORMAL HIGH (ref 4.0–10.5)
nRBC: 0 % (ref 0.0–0.2)

## 2022-06-17 LAB — URINE CULTURE

## 2022-06-17 LAB — GLUCOSE, CAPILLARY: Glucose-Capillary: 87 mg/dL (ref 70–99)

## 2022-06-17 MED ORDER — INSULIN ASPART 100 UNIT/ML IJ SOLN: 0.0000 [IU] | Freq: Three times a day (TID) | INTRAMUSCULAR | Status: DC

## 2022-06-17 MED ORDER — SODIUM CHLORIDE 0.9 % IV SOLN: INTRAVENOUS | Status: DC

## 2022-06-17 MED ORDER — SODIUM CHLORIDE 0.9 % IV SOLN
1.0000 g | Freq: Two times a day (BID) | INTRAVENOUS | Status: DC
  Administered 2022-06-17 (×2): 1 g via INTRAVENOUS
  Filled 2022-06-17 (×2): qty 20

## 2022-06-17 MED ORDER — INSULIN ASPART 100 UNIT/ML IJ SOLN: 0.0000 [IU] | Freq: Every day | INTRAMUSCULAR | Status: DC

## 2022-06-17 NOTE — Progress Notes (Signed)
Palliative:  HPI: 88 y.o. male  with past medical history of prostate cancer with bone mets (follows at [facility] and on [place]), h/o fall with C7 SCI and stay in [facility] Aug 2022, wheelchair bound, stroke, migraines, HTN, HLD, orthostatic hypotension, polymalgia rheumatica, diabetes admitted on 06/15/2022 with confusion and weakness with UTI sepsis.   I have reviewed records, diagnostics, and spoken with Dr. Denton Brick and RN Colletta Maryland. I met at [name] bedside with wife and daughter, Jared Bridges and Jared Bridges. We discussed his status including that he is a little more alert and interactive today but still sleepy and not ready for diet. I explained that we will reassess tomorrow readiness for MBS. We discussed leukocytosis, PCXR, and preliminary blood culture results. They understand that he is not yet out of the woods and best case scenario this is going to take time to see what his new baseline may be after this illness. They understand the importance of his need for diet and nutrition but also the importance of ensuring this is as safe as possible for him. We did not really discuss the risks vs benefits of artificial feeding during this visit but may need to discuss this in future visits depending on his progress. We reviewed again that his body will give [name] the signs to what it can and cannot do. We discussed quality of life and how this impacts the decisions that we make. They confirm DNR and seem at more peace with this decision. They are hopeful for improvement and further progress but still have good understanding to the severity of his illness. I encouraged self care.   All questions/concerns addressed. Emotional support provided. Therapeutic listening.   Exam: More alert although still sleepy/lethargic. Able to smile and return greeting. No distress. HR stable 60-70s. Breathing regular, unlabored with congested cough. Requiring less vasopressor support. Abd soft. Warm to touch.   Plan: - DNR - Watchful  waiting  - I will continue to follow and support  60 min  Vinie Sill, NP Palliative Medicine Team Pager [phone] (Please see [email or web address] for schedule) Team Phone [phone]    Greater than 50%  of this time was spent counseling and coordinating care related to the above assessment and plan

## 2022-06-17 NOTE — Progress Notes (Signed)
  Transition of Care [facility]) Screening Note   Patient Details  Name: Jared Bridges Date of Birth: [birth year]   Transition of Care [facility]) CM/SW Contact:    Iona Beard, [place] Phone Number: 06/17/2022, 11:16 AM  TOC following pts case. TOC will assess once pt is more medically stable. TOC to continue following case at this time.   Transition of Care Department [facility]) has reviewed patient and no TOC needs have been identified at this time. We will continue to monitor patient advancement through interdisciplinary progression rounds. If new patient transition needs arise, please place a TOC consult.

## 2022-06-17 NOTE — Plan of Care (Signed)

## 2022-06-17 NOTE — Progress Notes (Signed)
PROGRESS NOTE     Jared Bridges, is a 88 y.o. male, DOB - 01/24/1930, TJQ:[id number]  Admit date - 06/15/2022   Admitting Physician Jared Hoit, DO  Outpatient Primary MD for the patient is Jared Sark, MD  LOS - 1  Chief Complaint  Patient presents with   Weakness        Brief Narrative:  Brief Summary:-  88 y.o. male with medical history significant of hypertension, hyperlipidemia, T2DM, prostate cancer with osseous metastasis, BPH, hearing impairment, diabetic polyneuropathy admitted on 06/16/2022 with septic shock due to Morganella morganii bacteremia presumably from urinary source    -Assessment and Plan:     A/p 1) Morganella Morgagni septic shock from presumed urinary source--POA -Patient met sepsis criteria on admission -Lactic acidosis and leukocytosis noted WBC 12.6 >>>39.7 Lactic acid 5.4 >>2.0 -Required Levophed and vasopressin for pressure support as well as IV fluids -Stop cefepime treat empirically with meropenem pending sensitivity of Morganella morganii   2) history of urinary retention/BPH with LUTS--- unable to void, given hemodynamic instability and the need to monitor urine output accurately Foley catheter/coud catheter placed on 06/16/2022 with 400 mL of urine removed --- Hold off on Flomax due to hypotension   3) social/ethics--- plan of care discussed with patient's daughter and wife -Patient is a DNR/DNI without limitations to treatment at this time -Palliative care consult appreciated -Overall prognosis is guarded  4)HFpEF--- prior echo with EF of 65% with grade 1 diastolic dysfunction -Patient continues to require IV fluids at this time due to sepsis Monitor for possible fluid overload  5) acute on chronic anemia--- Hgb down to 8.7 suspect due to hemodilution due to IV fluids for sepsis -Consult closely and transfuse as clinically indicated  6)DM2--- prior A1c 6.4 reflecting good diabetic control PTA Use Novolog/Humalog Sliding  scale insulin with Accu-Cheks/Fingersticks as ordered   7) dysphagia--- speech pathologist eval appreciated -Recommends n.p.o. status for now due to high risk for aspiration   CRITICAL CARE Performed by: Jared Bridges   Total critical care time: 48 minutes  Critical care time was exclusive of separately billable procedures and treating other patients. - Morganella morganii septic shock/bacteremia--- with persistent hypotension requiring Levophed  Critical care was necessary to treat or prevent imminent or life-threatening deterioration.  Critical care was time spent personally by me on the following activities: development of treatment plan with patient and/or surrogate as well as nursing, discussions with consultants, evaluation of patient's response to treatment, examination of patient, obtaining history from patient or surrogate, ordering and performing treatments and interventions, ordering and review of laboratory studies, ordering and review of radiographic studies, pulse oximetry and re-evaluation of patient's condition.   Disposition/Need for in-Hospital Stay- patient unable to be discharged at this time due to --Morganella morganii septic shock requiring IV antibiotics and pressors*  Status is: Inpatient   Disposition: The patient is from: Home              Anticipated d/c is to:  TBD              Anticipated d/c date is: > 3 days              Patient currently is not medically stable to d/c. Barriers: Not Clinically Stable-   Code Status :  -  Code Status: DNR   Family Communication:   Discussed with wife and daughter  DVT Prophylaxis  :   - SCDs  enoxaparin (LOVENOX) injection 40 mg Start: 06/16/22 2200 SCDs Start:  06/16/22 0624   Lab Results  Component Value Date   PLT 158 06/17/2022    Inpatient Medications  Scheduled Meds:  atorvastatin  40 mg Oral Daily   Chlorhexidine Gluconate Cloth  6 each Topical Q0600   enoxaparin (LOVENOX) injection  40 mg  Subcutaneous Q24H   finasteride  5 mg Oral Daily   pantoprazole  40 mg Oral Daily   Continuous Infusions:  sodium chloride Stopped (06/16/22 0730)   sodium chloride 100 mL/hr at 06/17/22 1643   fluconazole (DIFLUCAN) IV Stopped (06/17/22 1140)   meropenem (MERREM) IV Stopped (06/17/22 1639)   norepinephrine (LEVOPHED) Adult infusion 2 mcg/min (06/17/22 1843)   vasopressin 0.01 Units/min (06/17/22 1643)   PRN Meds:.acetaminophen **OR** acetaminophen   Anti-infectives (From admission, onward)    Start     Dose/Rate Route Frequency Ordered Stop   06/17/22 1530  meropenem (MERREM) 1 g in sodium chloride 0.9 % 100 mL IVPB        1 g 200 mL/hr over 30 Minutes Intravenous Every 12 hours 06/17/22 1431     06/16/22 1615  ceFEPIme (MAXIPIME) 2 g in sodium chloride 0.9 % 100 mL IVPB  Status:  Discontinued        2 g 200 mL/hr over 30 Minutes Intravenous Every 12 hours 06/16/22 0400 06/16/22 0441   06/16/22 1600  ceFEPIme (MAXIPIME) 2 g in sodium chloride 0.9 % 100 mL IVPB  Status:  Discontinued        2 g 200 mL/hr over 30 Minutes Intravenous Every 12 hours 06/16/22 0441 06/17/22 1431   06/16/22 1000  fluconazole (DIFLUCAN) IVPB 200 mg        200 mg 100 mL/hr over 60 Minutes Intravenous Every 24 hours 06/16/22 0836     06/16/22 0400  ceFEPIme (MAXIPIME) 2 g in sodium chloride 0.9 % 100 mL IVPB        2 g 200 mL/hr over 30 Minutes Intravenous  Once 06/16/22 0348 06/16/22 0451         Subjective: Jared Bridges today has no fevers, no emesis,  No chest pain,   -Hypotension--- continues to require Levophed for pressure support -More awake -Denies pain   Objective: Vitals:   06/17/22 1900 06/17/22 1915 06/17/22 1930 06/17/22 1945  BP: (!) 121/44 117/61 (!) 121/51 (!) 116/49  Pulse: 66 72 72 73  Resp: (!) 25 (!) 25 (!) 28 (!) 27  Temp:   (!) 97.3 F (36.3 C)   TempSrc:   Axillary   SpO2: 100% 100% 100% 100%  Weight:      Height:        Intake/Output Summary (Last 24 hours) at  06/17/2022 2050 Last data filed at 06/17/2022 2004 Gross per 24 hour  Intake 2348.35 ml  Output 450 ml  Net 1898.35 ml   Filed Weights   06/15/22 2130 06/17/22 0500  Weight: 83.9 kg 86 kg    Physical Exam  Gen:- Awake , not very verbal somewhat sluggish HEENT:- [place].AT, No sclera icterus Neck-Supple Neck,No JVD,.  Lungs-diminished breath sounds, no wheezing  CV- S1, S2 normal, regular  Abd-  +ve B.Sounds, Abd Soft, No tenderness,    Extremity/Skin:-  , pedal pulses present  Psych-affect is flat, memory and cognitive deficits at baseline  neuro-generalized weakness, moving extremities no new focal deficits,   GU-Foley catheter in situ  Data Reviewed: I have personally reviewed following labs and imaging studies  CBC: Recent Labs  Lab 06/15/22 2213 06/17/22 0338  WBC 12.6* 39.7*  HGB 10.5* 8.7*  HCT 33.3* 27.8*  MCV 90.5 92.1  PLT 238 093   Basic Metabolic Panel: Recent Labs  Lab 06/15/22 2213 06/16/22 0401 06/17/22 0338  NA 137  --  136  K 3.8  --  3.9  CL 106  --  109  CO2 24  --  21*  GLUCOSE 139*  --  126*  BUN 27*  --  32*  CREATININE 1.15  --  1.24  CALCIUM 9.1  --  8.1*  MG  --  1.9  --   PHOS  --  3.0  --    GFR: Estimated Creatinine Clearance: 44.7 mL/min (by C-G formula based on SCr of 1.24 mg/dL). Liver Function Tests: Recent Labs  Lab 06/16/22 0401 06/17/22 0338  AST 15 33  ALT 9 16  ALKPHOS 69 47  BILITOT 1.0 0.6  PROT 5.9* 5.0*  ALBUMIN 2.6* 2.1*   Cardiac Enzymes: No results for input(s): "CKTOTAL", "CKMB", "CKMBINDEX", "TROPONINI" in the last 168 hours. BNP (last 3 results) No results for input(s): "PROBNP" in the last 8760 hours. HbA1C: No results for input(s): "HGBA1C" in the last 72 hours. Sepsis Labs: _0 (procalcitonin:4,lacticidven:4) ) Recent Results (from the past 240 hour(s))  Urine Culture     Status: Abnormal   Collection Time: 06/15/22  9:37 PM   Specimen: In/Out Cath Urine  Result Value Ref Range Status    Specimen Description   Final    IN/OUT CATH URINE Performed at [facility], [address] [place], [place] [address]    Special Requests   Final    NONE Performed at [facility], [address] [place], [place] [address]    Culture MULTIPLE SPECIES PRESENT, SUGGEST RECOLLECTION (A)  Final   Report Status 06/17/2022 FINAL  Final  Blood Culture (routine x 2)     Status: Abnormal (Preliminary result)   Collection Time: 06/16/22  3:38 AM   Specimen: BLOOD RIGHT ARM  Result Value Ref Range Status   Specimen Description   Final    BLOOD RIGHT ARM Performed at [facility], [address] [place], [place] [address]    Special Requests   Final    BOTTLES DRAWN AEROBIC AND ANAEROBIC Blood Culture adequate volume Performed at [facility], [address] [place], [place] [address]    Culture  Setup Time   Final    GRAM NEGATIVE RODS BOTTLES DRAWN AEROBIC AND ANAEROBIC Gram Stain Report Called to,Read Back By and Verified With: Quentin Angst RN [phone] K [place]SY[place] CRITICAL RESULT CALLED TO, READ BACK BY AND VERIFIED WITH: RN ROBERT MAYNARD 06/17/22_1 :26 BY TW    Culture (A)  Final    MORGANELLA MORGANII SUSCEPTIBILITIES TO FOLLOW Performed at [facility], [place] [address] [place], [place] [address]    Report Status PENDING  Incomplete  Blood Culture ID Panel (Reflexed)     Status: Abnormal   Collection Time: 06/16/22  3:38 AM  Result Value Ref Range Status   Enterococcus faecalis NOT DETECTED NOT DETECTED Final   Enterococcus Faecium NOT DETECTED NOT DETECTED Final   Listeria monocytogenes NOT DETECTED NOT DETECTED Final   Staphylococcus species NOT DETECTED NOT DETECTED Final   Staphylococcus aureus (BCID) NOT DETECTED NOT DETECTED Final   Staphylococcus epidermidis NOT DETECTED NOT DETECTED Final   Staphylococcus lugdunensis NOT DETECTED NOT DETECTED Final   Streptococcus species NOT DETECTED NOT DETECTED Final   Streptococcus agalactiae NOT DETECTED NOT DETECTED Final    Streptococcus pneumoniae NOT DETECTED  NOT DETECTED Final   Streptococcus pyogenes NOT DETECTED NOT DETECTED Final   A.calcoaceticus-baumannii NOT DETECTED NOT DETECTED Final   Bacteroides fragilis NOT DETECTED NOT DETECTED Final   Enterobacterales DETECTED (A) NOT DETECTED Final    Comment: Enterobacterales represent a large order of gram negative bacteria, not a single organism. Refer to culture for further identification. CRITICAL RESULT CALLED TO, READ BACK BY AND VERIFIED WITH: RN ROBERT MAYNARD 06/17/22_0 :26 BY TW    Enterobacter cloacae complex NOT DETECTED NOT DETECTED Final   Escherichia coli NOT DETECTED NOT DETECTED Final   Klebsiella aerogenes NOT DETECTED NOT DETECTED Final   Klebsiella oxytoca NOT DETECTED NOT DETECTED Final   Klebsiella pneumoniae NOT DETECTED NOT DETECTED Final   Proteus species NOT DETECTED NOT DETECTED Final   Salmonella species NOT DETECTED NOT DETECTED Final   Serratia marcescens NOT DETECTED NOT DETECTED Final   Haemophilus influenzae NOT DETECTED NOT DETECTED Final   Neisseria meningitidis NOT DETECTED NOT DETECTED Final   Pseudomonas aeruginosa NOT DETECTED NOT DETECTED Final   Stenotrophomonas maltophilia NOT DETECTED NOT DETECTED Final   Candida albicans NOT DETECTED NOT DETECTED Final   Candida auris NOT DETECTED NOT DETECTED Final   Candida glabrata NOT DETECTED NOT DETECTED Final   Candida krusei NOT DETECTED NOT DETECTED Final   Candida parapsilosis NOT DETECTED NOT DETECTED Final   Candida tropicalis NOT DETECTED NOT DETECTED Final   Cryptococcus neoformans/gattii NOT DETECTED NOT DETECTED Final   CTX-M ESBL NOT DETECTED NOT DETECTED Final   Carbapenem resistance IMP NOT DETECTED NOT DETECTED Final   Carbapenem resistance KPC NOT DETECTED NOT DETECTED Final   Carbapenem resistance NDM NOT DETECTED NOT DETECTED Final   Carbapenem resist OXA 48 LIKE NOT DETECTED NOT DETECTED Final   Carbapenem resistance VIM NOT DETECTED NOT DETECTED Final     Comment: Performed at [facility], [address] [place], [place] [address]  Resp Panel by RT-PCR (Flu A&B, Covid) Anterior Nasal Swab     Status: None   Collection Time: 06/16/22  3:48 AM   Specimen: Anterior Nasal Swab  Result Value Ref Range Status   SARS Coronavirus 2 by RT PCR NEGATIVE NEGATIVE Final    Comment: (NOTE) SARS-CoV-2 target nucleic acids are NOT DETECTED.  The SARS-CoV-2 RNA is generally detectable in upper respiratory specimens during the acute phase of infection. The lowest concentration of SARS-CoV-2 viral copies this assay can detect is 138 copies/mL. A negative result does not preclude SARS-Cov-2 infection and should not be used as the sole basis for treatment or other patient management decisions. A negative result may occur with  improper specimen collection/handling, submission of specimen other than nasopharyngeal swab, presence of viral mutation(s) within the areas targeted by this assay, and inadequate number of viral copies(<138 copies/mL). A negative result must be combined with clinical observations, patient history, and epidemiological information. The expected result is Negative.  Fact Sheet for Patients:  EntrepreneurPulse.com.au  Fact Sheet for Healthcare Providers:  IncredibleEmployment.be  This test is no t yet approved or cleared by the [place] FDA and  has been authorized for detection and/or diagnosis of SARS-CoV-2 by FDA under an Emergency Use Authorization (EUA). This EUA will remain  in effect (meaning this test can be used) for the duration of the COVID-19 declaration under Section 564(b)(1) of the Act, 21 U.S.C.section 360bbb-3(b)(1), unless the authorization is terminated  or revoked sooner.       Influenza A by PCR NEGATIVE NEGATIVE Final   Influenza  B by PCR NEGATIVE NEGATIVE Final    Comment: (NOTE) The Xpert Xpress SARS-CoV-2/FLU/RSV plus assay is intended as an aid in  the diagnosis of influenza from Nasopharyngeal swab specimens and should not be used as a sole basis for treatment. Nasal washings and aspirates are unacceptable for Xpert Xpress SARS-CoV-2/FLU/RSV testing.  Fact Sheet for Patients: EntrepreneurPulse.com.au  Fact Sheet for Healthcare Providers: IncredibleEmployment.be  This test is not yet approved or cleared by the [place] FDA and has been authorized for detection and/or diagnosis of SARS-CoV-2 by FDA under an Emergency Use Authorization (EUA). This EUA will remain in effect (meaning this test can be used) for the duration of the COVID-19 declaration under Section 564(b)(1) of the Act, 21 U.S.C. section 360bbb-3(b)(1), unless the authorization is terminated or revoked.  Performed at [facility], [address], [place], [place] [address]   Blood Culture (routine x 2)     Status: Abnormal (Preliminary result)   Collection Time: 06/16/22  4:07 AM   Specimen: BLOOD RIGHT HAND  Result Value Ref Range Status   Specimen Description   Final    BLOOD RIGHT HAND Performed at [facility], [address] [place], [place] [address]    Special Requests   Final    BOTTLES DRAWN AEROBIC AND ANAEROBIC Blood Culture adequate volume Performed at [facility], [address], [place], [place] [address]    Culture  Setup Time   Final    GRAM NEGATIVE RODS BOTTLES DRAWN AEROBIC AND ANAEROBIC CRITICAL VALUE NOTED.  VALUE IS CONSISTENT WITH PREVIOUSLY REPORTED AND CALLED VALUE. Performed at [facility] [place], [address] [place],  [address]    Culture [facility] MORGANII (A)  Final   Report Status PENDING  Incomplete  MRSA Next Gen by PCR, Nasal     Status: None   Collection Time: 06/16/22  6:04 AM   Specimen: Nasal Mucosa; Nasal Swab  Result Value Ref Range Status   MRSA by PCR Next Gen NOT DETECTED NOT DETECTED Final    Comment: (NOTE) The GeneXpert MRSA Assay (FDA approved for NASAL specimens  only), is one component of a comprehensive MRSA colonization surveillance program. It is not intended to diagnose MRSA infection nor to guide or monitor treatment for MRSA infections. Test performance is not FDA approved in patients less than 32 years old. Performed at [facility], [address], [place],  [address]       Radiology Studies: DG CHEST PORT 1 VIEW  Result Date: 06/17/2022 CLINICAL DATA:  Dyspnea EXAM: PORTABLE CHEST 1 VIEW COMPARISON:  Chest radiograph from one day prior. FINDINGS: Right subclavian central venous catheter terminates over the cavoatrial junction. Stable cardiomediastinal silhouette with normal heart size. No pneumothorax. No pleural effusion. Mild bibasilar streaky opacities, similar, favor atelectasis. No pulmonary edema. IMPRESSION: Stable mild bibasilar streaky opacities, favor atelectasis. Electronically Signed   By: Ilona Sorrel M.D.   On: 06/17/2022 09:55   DG CHEST PORT 1 VIEW  Result Date: 06/16/2022 CLINICAL DATA:  Post line placement EXAM: PORTABLE CHEST 1 VIEW COMPARISON:  06/15/2022 FINDINGS: Right subclavian line tip overlies the cavoatrial junction. No pneumothorax. Lung aeration is similar with no new consolidation or edema. No pleural effusion. Stable cardiomediastinal contours. IMPRESSION: Right subclavian line tip overlies cavoatrial junction. No pneumothorax. Electronically Signed   By: Macy Mis M.D.   On: 06/16/2022 12:13   CT Head Wo Contrast  Result Date: 06/16/2022 CLINICAL DATA:  Mental status change, unknown cause Weakness. EXAM: CT HEAD WITHOUT CONTRAST  TECHNIQUE: Contiguous axial images were obtained from the base of the skull through the vertex without intravenous contrast. RADIATION DOSE REDUCTION: This exam was performed according to the departmental dose-optimization program which includes automated exposure control, adjustment of the mA and/or kV according to patient size and/or use of iterative reconstruction technique.  COMPARISON:  Head CT 07/01/2021 FINDINGS: Brain: No intracranial hemorrhage, mass effect, or midline shift. No hydrocephalus. Stable degree of atrophy and chronic small vessel ischemic change. The basilar cisterns are patent. No evidence of territorial infarct or acute ischemia. No extra-axial or intracranial fluid collection. Vascular: Atherosclerosis of skullbase vasculature without hyperdense vessel or abnormal calcification. Skull: No fracture or focal lesion. Sinuses/Orbits: Paranasal sinuses and mastoid air cells are clear. The visualized orbits are unremarkable. Bilateral cataract resection. Chronic defect of the right lamina papyracea. Other: None. IMPRESSION: 1. No acute intracranial abnormality. 2. Stable atrophy and chronic small vessel ischemic change. Electronically Signed   By: Keith Rake M.D.   On: 06/16/2022 01:00   DG Chest Portable 1 View  Result Date: 06/15/2022 CLINICAL DATA:  Shortness of breath.  Weakness. EXAM: PORTABLE CHEST 1 VIEW COMPARISON:  Chest CTA 04/27/2022, chest radiograph 07/06/2021 FINDINGS: The heart is normal in size. Stable mediastinal tortuous [place]. No acute airspace disease. No pulmonary edema, pleural effusion, or pneumothorax. The patient's chin partially obscures the medial apices. Acute osseous findings. IMPRESSION: No acute chest findings. Electronically Signed   By: Keith Rake M.D.   On: 06/15/2022 22:56     Scheduled Meds:  atorvastatin  40 mg Oral Daily   Chlorhexidine Gluconate Cloth  6 each Topical Q0600   enoxaparin (LOVENOX) injection  40 mg Subcutaneous Q24H   finasteride  5 mg Oral Daily   pantoprazole  40 mg Oral Daily   Continuous Infusions:  sodium chloride Stopped (06/16/22 0730)   sodium chloride 100 mL/hr at 06/17/22 1643   fluconazole (DIFLUCAN) IV Stopped (06/17/22 1140)   meropenem (MERREM) IV Stopped (06/17/22 1639)   norepinephrine (LEVOPHED) Adult infusion 2 mcg/min (06/17/22 1843)   vasopressin 0.01 Units/min  (06/17/22 1643)     LOS: 1 day    Jared Bridges M.D on 06/17/2022 at 8:50 PM  Go to [email or web address] - for contact info  [facility] - Office  [phone]  If 7PM-7AM, please contact night-coverage [email or web address] 06/17/2022, 8:50 PM

## 2022-06-17 NOTE — Evaluation (Signed)
Clinical/Bedside Swallow Evaluation Patient Details  Name: Jared Bridges MRN: [id number] Date of Birth: 04-04-1930  Today's Date: 06/17/2022 Time: SLP Start Time (ACUTE ONLY): 3614 SLP Stop Time (ACUTE ONLY): 0909 SLP Time Calculation (min) (ACUTE ONLY): 27 min  Past Medical History:  Past Medical History:  Diagnosis Date   Anemia, iron deficiency    Chronic headaches    Migraines   Chronic kidney disease    Resolved   Depression    Diverticulitis    Essential hypertension    GI bleed    Dr. Laural Golden - 1998   Hypercholesteremia    Orthostatic hypotension    Peptic ulcer disease    Peripheral neuropathy    Polymyalgia rheumatica ([place])    Stroke ([place])    Residual trouble reading and writing   Syncope    Thyroid nodule    TIA (transient ischemic attack) 12/2017   Type 2 diabetes mellitus ([place])    Past Surgical History:  Past Surgical History:  Procedure Laterality Date   APPENDECTOMY     as child   BACK SURGERY     2000 IDET SPINAL PROC   CHOLECYSTECTOMY N/A 04/07/2016   Procedure: CHOLECYSTECTOMY;  Surgeon: Aviva Signs, MD;  Location: [facility];  Service: General;  Laterality: N/A;   [place]   COLONOSCOPY N/A 01/07/2018   Procedure: COLONOSCOPY;  Surgeon: Rogene Houston, MD;  Location: [facility];  Service: Endoscopy;  Laterality: N/A;  1:00   CRYOTHERAPY     EYE SURGERY     right cataract with lens implant   FEMUR SURGERY     RT LEG        1953   HAND SURGERY     RIGHT   HERNIA REPAIR     1-right inguinal, 3- left inguinal   IRRIGATION AND DEBRIDEMENT KNEE Left 07/09/2015   Procedure: LEFT KNEE IRRIGATION AND DEBRIDEMENT WOUND CLOSURE;  Surgeon: Gaynelle Arabian, MD;  Location: [facility];  Service: Orthopedics;  Laterality: Left;   JOINT REPLACEMENT  02/28/2016   lt knee revision   KNEE DEBRIDEMENT     2016   LEFT   MENISCUS REPAIR     LEFT   POLYPECTOMY  01/07/2018   Procedure: POLYPECTOMY;  Surgeon: Rogene Houston, MD;   Location: [facility];  Service: Endoscopy;;  colon    SHOULDER OPEN ROTATOR CUFF REPAIR     RT SHOULDER   SPINAL FUSION     SPINAL FUSION     TONSILLECTOMY     TOTAL KNEE ARTHROPLASTY Left 05/28/2015   Procedure: LEFT TOTAL KNEE ARTHROPLASTY;  Surgeon: Gaynelle Arabian, MD;  Location: [facility];  Service: Orthopedics;  Laterality: Left;   TOTAL KNEE REVISION Left 02/28/2016   Procedure: LEFT TOTAL KNEE REVISION;  Surgeon: Leandrew Koyanagi, MD;  Location: [facility];  Service: Orthopedics;  Laterality: Left;   TRIGGER FINGER RELEASE     RT HAND   1990S   UVULECTOMY  2004   TURBINATE,TONSIL,ADENOID   HPI:  Jared Bridges is a 88 y.o. male with medical history significant of hypertension, hyperlipidemia, T2DM, prostate cancer with osseous metastasis, BPH, hearing impairment, diabetic polyneuropathy who presents to the emergency department via EMS from home due to confusion and weakness.  Patient was unable to provide history, history was obtained from ED physician and ED medical record.  Per report, daughter activated EMS stating that patient has progressively become weaker and has been retaining  urine. Chest x-ray showed no acute chest findings.    Assessment / Plan / Recommendation  Clinical Impression  Clinical swallowing evaluation completed while Pt was sitting upright in bed; Pt was alert and responsive and would answer initial questions but would then seem tired and unable to attend and respond for a full conversation. Pt reports being very hard of hearing which could also negatively impact his responsiveness; however, difficult to r/o lethargy as primary component. Pt presents with overt s/sx of oropharyngeal dysphagia re: wet, congested immediate coughing after all thin liquid trials and delayed congested coughing with NTL and HTL. With puree and solid textures also note delayed congested coughing. Recommend proceed with instrumental testing prior to initiating a diet. After speaking with MD recommend  completing MBS after Pt is more medically stable, tentatively plan for tomorrow as schedule permits. Pt is at high risk for aspiraiton; Recommend continue NPO with permission for ice chips after oral care; Recommend all meds via alternative means, if possible. If urgent needs for PO meds recommend crush with puree. Thank you, SLP Visit Diagnosis: Dysphagia, unspecified (R13.10)    Aspiration Risk  Moderate aspiration risk    Diet Recommendation NPO   Liquid Administration via: Spoon (ice chips only after oral care) Medication Administration: Via alternative means    Other  Recommendations Oral Care Recommendations: Oral care QID;Oral care prior to ice chip/H20    Recommendations for follow up therapy are one component of a multi-disciplinary discharge planning process, led by the attending physician.  Recommendations may be updated based on patient status, additional functional criteria and insurance authorization.  Follow up Recommendations        Assistance Recommended at Discharge    Functional Status Assessment    Frequency and Duration min 2x/week  1 week       Prognosis Prognosis for Safe Diet Advancement: Good      Swallow Study   General Date of Onset: 06/16/22 HPI: Jared Bridges is a 88 y.o. male with medical history significant of hypertension, hyperlipidemia, T2DM, prostate cancer with osseous metastasis, BPH, hearing impairment, diabetic polyneuropathy who presents to the emergency department via EMS from home due to confusion and weakness.  Patient was unable to provide history, history was obtained from ED physician and ED medical record.  Per report, daughter activated EMS stating that patient has progressively become weaker and has been retaining urine. Chest x-ray showed no acute chest findings. Type of Study: Bedside Swallow Evaluation Previous Swallow Assessment: none in chart Diet Prior to this Study: NPO Temperature Spikes Noted: No Respiratory Status: Room  air History of Recent Intubation: No Behavior/Cognition: Alert;Cooperative Oral Cavity Assessment: Dry;Within Functional Limits Oral Care Completed by SLP: Recent completion by staff Oral Cavity - Dentition: Adequate natural dentition Self-Feeding Abilities: Needs assist;Needs set up Patient Positioning: Upright in bed Baseline Vocal Quality: Normal Volitional Cough: Congested;Weak Volitional Swallow: Able to elicit    Oral/Motor/Sensory Function     Ice Chips Ice chips: Within functional limits   Thin Liquid Thin Liquid: Impaired Presentation: Spoon;Cup Oral Phase Impairments: Reduced labial seal;Reduced lingual movement/coordination Oral Phase Functional Implications: Oral holding Pharyngeal  Phase Impairments: Wet Vocal Quality;Cough - Immediate;Throat Clearing - Delayed;Multiple swallows    Nectar Thick Nectar Thick Liquid: Impaired Presentation: Cup Oral Phase Impairments: Reduced lingual movement/coordination;Poor awareness of bolus Oral phase functional implications: Oral residue;Oral holding Pharyngeal Phase Impairments: Multiple swallows;Wet Vocal Quality;Cough - Delayed   Honey Thick Honey Thick Liquid: Impaired Presentation: Cup Oral Phase Impairments: Reduced lingual  movement/coordination Oral Phase Functional Implications: Prolonged oral transit;Oral holding Pharyngeal Phase Impairments: Wet Vocal Quality;Throat Clearing - Delayed;Cough - Delayed   Puree Puree: Impaired Presentation: Spoon Oral Phase Impairments: Reduced lingual movement/coordination Oral Phase Functional Implications: Prolonged oral transit Pharyngeal Phase Impairments: Cough - Delayed;Throat Clearing - Delayed   Solid     Solid: Impaired Oral Phase Impairments: Reduced lingual movement/coordination Oral Phase Functional Implications: Prolonged oral transit Pharyngeal Phase Impairments: Cough - Delayed;Throat Clearing - Delayed;Wet Vocal Quality     Jahsiah Carpenter H. Roddie Mc, CCC-SLP Speech  Language Pathologist  Wende Bushy 06/17/2022,9:22 AM

## 2022-06-17 NOTE — Progress Notes (Signed)
Pharmacy Antibiotic Note  Jared Bridges is a 88 y.o. male admitted on 06/15/2022 with bacteremia  Pharmacy has been consulted for merrem dosing.   Plan: Meropenem 1000 mg IV every 12 hours. Monitor clinical progress, cultures/sensitivities, renal function   Height: '6\' 1"'$  (185.4 cm) Weight: 86 kg (189 lb 9.5 oz) IBW/kg (Calculated) : 79.9  Temp (24hrs), Avg:99.6 F (37.6 C), Min:98 F (36.7 C), Max:100.9 F (38.3 C)  Recent Labs  Lab 06/15/22 2213 06/16/22 0401 06/16/22 0529 06/16/22 0752 06/16/22 1116 06/17/22 0338 06/17/22 0829  WBC 12.6*  --   --   --   --  39.7*  --   CREATININE 1.15  --   --   --   --  1.24  --   LATICACIDVEN  --  5.1* 4.7* 5.4* 5.4*  --  2.0*     Estimated Creatinine Clearance: 44.7 mL/min (by C-G formula based on SCr of 1.24 mg/dL).    Allergies  Allergen Reactions   Iodine Hives, Itching and Other (See Comments)    very allergic(per daughter), can pre-med with benadryl   Calan [Verapamil] Other (See Comments)    Weakness   Cymbalta [Duloxetine Hcl] Other (See Comments)    Makes patient have jerking motions.   Iodinated Contrast Media Hives, Itching and Other (See Comments)    Can pre-med with benadryl   Latex Other (See Comments)    Redness iritation   Lisinopril Other (See Comments)    Makes patient have jerking motions.   Neurontin [Gabapentin] Other (See Comments)    Makes patient have jerking motions.   Zoloft [Sertraline Hcl] Diarrhea and Other (See Comments)    Makes patient have jerking motions.   Valium [Diazepam] Other (See Comments)    "Did not work"   Flomax [Tamsulosin Hcl] Other (See Comments)    Dizziness   Glimepiride Other (See Comments)    "Did not work"   Melatonin Nausea Only   Tape Other (See Comments)    Skin irritation    Antimicrobials this admission: Merrem 7/18 >> Cefepime 7/17 >> 7/18   Dose adjustments this admission:   Microbiology results: 7/16 BCx: morganella morganii  7/16 UCx: multiple  species    Margot Ables, PharmD Clinical Pharmacist 06/17/2022 2:34 PM

## 2022-06-18 DIAGNOSIS — I10 Essential (primary) hypertension: Secondary | ICD-10-CM | POA: Diagnosis not present

## 2022-06-18 DIAGNOSIS — N390 Urinary tract infection, site not specified: Secondary | ICD-10-CM

## 2022-06-18 DIAGNOSIS — Z7189 Other specified counseling: Secondary | ICD-10-CM | POA: Diagnosis not present

## 2022-06-18 DIAGNOSIS — R6521 Severe sepsis with septic shock: Secondary | ICD-10-CM | POA: Diagnosis not present

## 2022-06-18 DIAGNOSIS — I5032 Chronic diastolic (congestive) heart failure: Secondary | ICD-10-CM | POA: Diagnosis not present

## 2022-06-18 DIAGNOSIS — G9340 Encephalopathy, unspecified: Secondary | ICD-10-CM

## 2022-06-18 DIAGNOSIS — Z515 Encounter for palliative care: Secondary | ICD-10-CM | POA: Diagnosis not present

## 2022-06-18 DIAGNOSIS — N401 Enlarged prostate with lower urinary tract symptoms: Secondary | ICD-10-CM | POA: Diagnosis not present

## 2022-06-18 DIAGNOSIS — R319 Hematuria, unspecified: Secondary | ICD-10-CM

## 2022-06-18 DIAGNOSIS — A419 Sepsis, unspecified organism: Secondary | ICD-10-CM | POA: Diagnosis not present

## 2022-06-18 LAB — COMPREHENSIVE METABOLIC PANEL
ALT: 14 U/L (ref 0–44)
AST: 26 U/L (ref 15–41)
Albumin: 2 g/dL — ABNORMAL LOW (ref 3.5–5.0)
Alkaline Phosphatase: 58 U/L (ref 38–126)
Anion gap: 4 — ABNORMAL LOW (ref 5–15)
BUN: 34 mg/dL — ABNORMAL HIGH (ref 8–23)
CO2: 23 mmol/L (ref 22–32)
Calcium: 7.8 mg/dL — ABNORMAL LOW (ref 8.9–10.3)
Chloride: 111 mmol/L (ref 98–111)
Creatinine, Ser: 0.87 mg/dL (ref 0.61–1.24)
GFR, Estimated: >60 mL/min (ref >60)
Glucose, Bld: 76 mg/dL (ref 70–99)
Potassium: 3.2 mmol/L — ABNORMAL LOW (ref 3.5–5.1)
Sodium: 138 mmol/L (ref 135–145)
Total Bilirubin: 0.6 mg/dL (ref 0.3–1.2)
Total Protein: 5 g/dL — ABNORMAL LOW (ref 6.5–8.1)

## 2022-06-18 LAB — CBC
HCT: 27.1 % — ABNORMAL LOW (ref 39.0–52.0)
Hemoglobin: 8.5 g/dL — ABNORMAL LOW (ref 13.0–17.0)
MCH: 28.8 pg (ref 26.0–34.0)
MCHC: 31.4 g/dL (ref 30.0–36.0)
MCV: 91.9 fL (ref 80.0–100.0)
Platelets: 115 10*3/uL — ABNORMAL LOW (ref 150–400)
RBC: 2.95 MIL/uL — ABNORMAL LOW (ref 4.22–5.81)
RDW: 14.5 % (ref 11.5–15.5)
WBC: 27.6 10*3/uL — ABNORMAL HIGH (ref 4.0–10.5)
nRBC: 0 % (ref 0.0–0.2)

## 2022-06-18 LAB — CULTURE, BLOOD (ROUTINE X 2)
Special Requests: ADEQUATE
Special Requests: ADEQUATE

## 2022-06-18 LAB — GLUCOSE, CAPILLARY
Glucose-Capillary: 63 mg/dL — ABNORMAL LOW (ref 70–99)
Glucose-Capillary: 73 mg/dL (ref 70–99)
Glucose-Capillary: 60 mg/dL — ABNORMAL LOW (ref 70–99)
Glucose-Capillary: 59 mg/dL — ABNORMAL LOW (ref 70–99)
Glucose-Capillary: 87 mg/dL (ref 70–99)

## 2022-06-18 MED ORDER — DEXTROSE 50 % IV SOLN
INTRAVENOUS | Status: AC
  Administered 2022-06-18: 50 mL
  Filled 2022-06-18: qty 50

## 2022-06-18 MED ORDER — DEXTROSE 50 % IV SOLN: 12.5000 g | INTRAVENOUS | Status: AC

## 2022-06-18 MED ORDER — POTASSIUM CHLORIDE 10 MEQ/100ML IV SOLN
10.0000 meq | INTRAVENOUS | Status: AC
  Administered 2022-06-18 (×4): 10 meq via INTRAVENOUS
  Filled 2022-06-18 (×4): qty 100

## 2022-06-18 MED ORDER — SODIUM CHLORIDE 0.9 % IV SOLN
1.0000 g | Freq: Three times a day (TID) | INTRAVENOUS | Status: DC
  Administered 2022-06-18 – 2022-06-20 (×7): 1 g via INTRAVENOUS
  Filled 2022-06-18 (×8): qty 20

## 2022-06-18 NOTE — Progress Notes (Signed)
PROGRESS NOTE     Jared Bridges, is a 88 y.o. male, DOB - 12-13-1929, EMV:[id number]  Admit date - 06/15/2022   Admitting Physician Bernadette Hoit, DO  Outpatient Primary MD for the patient is Jared Sark, MD  LOS - 2  Chief Complaint  Patient presents with   Weakness        Brief Narrative:  Brief Summary:-  88 y.o. male with medical history significant of hypertension, hyperlipidemia, T2DM, prostate cancer with osseous metastasis, BPH, hearing impairment, diabetic polyneuropathy admitted on 06/16/2022 with septic shock due to Morganella morganii bacteremia presumably from urinary source    -Assessment and Plan:     A/p 1) Morganella Morgagni septic shock from presumed urinary source--POA -Patient met sepsis criteria on admission -Lactic acidosis and leukocytosis noted WBC 12.6 >>>39.7 Lactic acid 5.4 >>2.0 -Required Levophed and vasopressin for pressure support as well as IV fluids until 06/18/2022; patient currently off pressors with stable vital signs. -Continue treatment with meropenem; multidrug-resistant results appreciated by sensitivity.  Possible option for patient is the use of Bactrim when able to take by mouth at time of discharge.   2) history of urinary retention/BPH with LUTS--- unable to void, given hemodynamic instability and the need to monitor urine output accurately Foley catheter/coud catheter placed on 06/16/2022 with 400 mL of urine removed -Continue to hold Proscar in the setting of hypotension -Continue to closely follow urine output. -Will attempt voiding trial once medically stable.   3) social/ethics--- plan of care discussed with patient's daughter and wife -Patient is a DNR/DNI without limitations to treatment at this time. -Palliative care consult appreciated, will follow further recommendations. -Overall prognosis is guarded  4)HFpEF--- prior echo with EF of 65% with grade 1 diastolic dysfunction -Patient continues to require IV  fluids at this time due to sepsis. -Continue to closely follow volume status as patient is at risk for fluid overload.  5) acute on chronic anemia--- patient with anemia of chronic disease  -Hgb down to 8.7 suspect due to hemodilution due to IV fluids for sepsis. -No overt bleeding appreciated -Continue to closely follow hemoglobin trend and transfuse as needed.  6)DM2--- prior A1c 6.4 reflecting good diabetic control PTA -Continue sliding scale insulin and close monitoring of patient's CBGs to further adjust management as needed.  7) dysphagia--- speech pathologist assistance and recommendations appreciated. -Planning for MBS once more alert and interactive. -Patient continue to be high risk for aspiration.   CRITICAL CARE Performed by: Barton Dubois   Total critical care time: 45 minutes  Critical care time was exclusive of separately billable procedures and treating other patients. - Morganella morganii septic shock/bacteremia--- with prior requirement of IV pressor support (currently off) and the need of IV antibiotics.  Critical care was necessary to treat or prevent imminent or life-threatening deterioration.  Critical care was time spent personally by me on the following activities: development of treatment plan with patient and/or surrogate as well as nursing, discussions with consultants, evaluation of patient's response to treatment, examination of patient, obtaining history from patient or surrogate, ordering and performing treatments and interventions, ordering and review of laboratory studies, ordering and review of radiographic studies, pulse oximetry and re-evaluation of patient's condition.   Disposition/Need for in-Hospital Stay- patient unable to be discharged at this time due to --Morganella morganii septic shock requiring IV antibiotics.  Currently off pressors; but is still not completely out of the woods.  Status is: Inpatient   Disposition: The patient is  from: Home  Anticipated d/c is to:  TBD              Anticipated d/c date is: > 3 days              Patient currently is not medically stable to d/c.  Barriers: Not Clinically Stable-   Code Status :  -  Code Status: DNR   Family Communication:   No family at bedside at time of my evaluation.  DVT Prophylaxis  :   - SCDs  enoxaparin (LOVENOX) injection 40 mg Start: 06/16/22 2200 SCDs Start: 06/16/22 0624   Lab Results  Component Value Date   PLT 115 (L) 06/18/2022    Inpatient Medications  Scheduled Meds:  atorvastatin  40 mg Oral Daily   Chlorhexidine Gluconate Cloth  6 each Topical Q0600   enoxaparin (LOVENOX) injection  40 mg Subcutaneous Q24H   finasteride  5 mg Oral Daily   insulin aspart  0-5 Units Subcutaneous QHS   insulin aspart  0-6 Units Subcutaneous TID WC   pantoprazole  40 mg Oral Daily   Continuous Infusions:  sodium chloride Stopped (06/16/22 0730)   sodium chloride Stopped (06/18/22 1747)   fluconazole (DIFLUCAN) IV Stopped (06/18/22 1227)   meropenem (MERREM) IV 1 g (06/18/22 1748)   norepinephrine (LEVOPHED) Adult infusion 2 mcg/min (06/17/22 1843)   vasopressin 0.01 Units/min (06/17/22 1643)   PRN Meds:.acetaminophen **OR** acetaminophen   Anti-infectives (From admission, onward)    Start     Dose/Rate Route Frequency Ordered Stop   06/18/22 1000  meropenem (MERREM) 1 g in sodium chloride 0.9 % 100 mL IVPB        1 g 200 mL/hr over 30 Minutes Intravenous Every 8 hours 06/18/22 0855     06/17/22 1530  meropenem (MERREM) 1 g in sodium chloride 0.9 % 100 mL IVPB  Status:  Discontinued        1 g 200 mL/hr over 30 Minutes Intravenous Every 12 hours 06/17/22 1431 06/18/22 0855   06/16/22 1615  ceFEPIme (MAXIPIME) 2 g in sodium chloride 0.9 % 100 mL IVPB  Status:  Discontinued        2 g 200 mL/hr over 30 Minutes Intravenous Every 12 hours 06/16/22 0400 06/16/22 0441   06/16/22 1600  ceFEPIme (MAXIPIME) 2 g in sodium chloride 0.9 % 100  mL IVPB  Status:  Discontinued        2 g 200 mL/hr over 30 Minutes Intravenous Every 12 hours 06/16/22 0441 06/17/22 1431   06/16/22 1000  fluconazole (DIFLUCAN) IVPB 200 mg        200 mg 100 mL/hr over 60 Minutes Intravenous Every 24 hours 06/16/22 0836     06/16/22 0400  ceFEPIme (MAXIPIME) 2 g in sodium chloride 0.9 % 100 mL IVPB        2 g 200 mL/hr over 30 Minutes Intravenous  Once 06/16/22 0348 06/16/22 0451         Subjective: [name] reported no chest pain, nausea, vomiting or fevers.  Slightly more interactive as per reports per nursing staff reviewed documentation from prior attending.  Patient is of pressors.    Objective: Vitals:   06/18/22 1400 06/18/22 1500 06/18/22 1600 06/18/22 1700  BP: (!) 118/39 (!) 105/49 (!) 115/42 (!) 106/49  Pulse: 74 67 80 71  Resp: (!) '27 20 20 ' (!) 24  Temp:    98 F (36.7 C)  TempSrc:    Axillary  SpO2: 99% 98% 100% 100%  Weight:  Height:        Intake/Output Summary (Last 24 hours) at 06/18/2022 1842 Last data filed at 06/18/2022 1748 Gross per 24 hour  Intake 2279.64 ml  Output 1075 ml  Net 1204.64 ml   Filed Weights   06/15/22 2130 06/17/22 0500 06/18/22 0500  Weight: 83.9 kg 86 kg 87.4 kg    Physical Exam General exam: Alert, awake and more interactive on today's examination; able to follow simple commands.  Was slow to respond, but able to adequately answer simple questions.  No fever, no chest pain, no nausea, no vomiting. Respiratory system: Positive rhonchi bilaterally; no expiratory wheezing.  No using accessory muscles. Cardiovascular system: Rate controlled, no rubs, no gallops, no JVD appreciated on exam. Gastrointestinal system: Abdomen is nondistended, soft and nontender. No organomegaly or masses felt. Normal bowel sounds heard. Central nervous system: Alert and oriented. No focal neurological deficits. Extremities: No cyanosis or clubbing. Skin: No petechiae.  Foley catheter in place. Psychiatry:  Judgement and insight appear impaired due to memory and cognitive deficits at baseline along with presumed encephalopathic process.  Data Reviewed: I have personally reviewed following labs and imaging studies  CBC: Recent Labs  Lab 06/15/22 2213 06/17/22 0338 06/18/22 0359  WBC 12.6* 39.7* 27.6*  HGB 10.5* 8.7* 8.5*  HCT 33.3* 27.8* 27.1*  MCV 90.5 92.1 91.9  PLT 238 158 948*   Basic Metabolic Panel: Recent Labs  Lab 06/15/22 2213 06/16/22 0401 06/17/22 0338 06/18/22 0359  NA 137  --  136 138  K 3.8  --  3.9 3.2*  CL 106  --  109 111  CO2 24  --  21* 23  GLUCOSE 139*  --  126* 76  BUN 27*  --  32* 34*  CREATININE 1.15  --  1.24 0.87  CALCIUM 9.1  --  8.1* 7.8*  MG  --  1.9  --   --   PHOS  --  3.0  --   --    GFR: Estimated Creatinine Clearance: 63.8 mL/min (by C-G formula based on SCr of 0.87 mg/dL).  Liver Function Tests: Recent Labs  Lab 06/16/22 0401 06/17/22 0338 06/18/22 0359  AST 15 33 26  ALT '9 16 14  ' ALKPHOS 69 47 58  BILITOT 1.0 0.6 0.6  PROT 5.9* 5.0* 5.0*  ALBUMIN 2.6* 2.1* 2.0*   Sepsis Labs:  Recent Results (from the past 240 hour(s))  Urine Culture     Status: Abnormal   Collection Time: 06/15/22  9:37 PM   Specimen: In/Out Cath Urine  Result Value Ref Range Status   Specimen Description   Final    IN/OUT CATH URINE Performed at [facility], [address] [name], [place] [address]    Special Requests   Final    NONE Performed at [facility], [address] [place], [place] [address]    Culture MULTIPLE SPECIES PRESENT, SUGGEST RECOLLECTION (A)  Final   Report Status 06/17/2022 FINAL  Final  Blood Culture (routine x 2)     Status: Abnormal   Collection Time: 06/16/22  3:38 AM   Specimen: BLOOD RIGHT ARM  Result Value Ref Range Status   Specimen Description   Final    BLOOD RIGHT ARM Performed at [facility], [address], [place], [place] [address]    Special Requests   Final    BOTTLES DRAWN AEROBIC AND ANAEROBIC Blood  Culture adequate volume Performed at [facility], [address] [place],  [address]  Culture  Setup Time   Final    GRAM NEGATIVE RODS BOTTLES DRAWN AEROBIC AND ANAEROBIC Gram Stain Report Called to,Read Back By and Verified With: Quentin Angst RN [phone] K [place] CRITICAL RESULT CALLED TO, READ BACK BY AND VERIFIED WITH: RN ROBERT MAYNARD 06/17/22'@00' :26 BY TW Performed at [name], [place] [address] [place], [place] [address]    Culture [facility] MORGANII (A)  Final   Report Status 06/18/2022 FINAL  Final   Organism ID, Bacteria MORGANELLA MORGANII  Final      Susceptibility   Morganella morganii - MIC*    AMPICILLIN >=32 RESISTANT Resistant     CEFAZOLIN >=64 RESISTANT Resistant     CEFTAZIDIME 16 INTERMEDIATE Intermediate     CIPROFLOXACIN <=0.25 SENSITIVE Sensitive     GENTAMICIN <=1 SENSITIVE Sensitive     IMIPENEM 4 SENSITIVE Sensitive     TRIMETH/SULFA <=20 SENSITIVE Sensitive     AMPICILLIN/SULBACTAM >=32 RESISTANT Resistant     PIP/TAZO <=4 SENSITIVE Sensitive     * MORGANELLA MORGANII  Blood Culture ID Panel (Reflexed)     Status: Abnormal   Collection Time: 06/16/22  3:38 AM  Result Value Ref Range Status   Enterococcus faecalis NOT DETECTED NOT DETECTED Final   Enterococcus Faecium NOT DETECTED NOT DETECTED Final   Listeria monocytogenes NOT DETECTED NOT DETECTED Final   Staphylococcus species NOT DETECTED NOT DETECTED Final   Staphylococcus aureus (BCID) NOT DETECTED NOT DETECTED Final   Staphylococcus epidermidis NOT DETECTED NOT DETECTED Final   Staphylococcus lugdunensis NOT DETECTED NOT DETECTED Final   Streptococcus species NOT DETECTED NOT DETECTED Final   Streptococcus agalactiae NOT DETECTED NOT DETECTED Final   Streptococcus pneumoniae NOT DETECTED NOT DETECTED Final   Streptococcus pyogenes NOT DETECTED NOT DETECTED Final   A.calcoaceticus-baumannii NOT DETECTED NOT DETECTED Final   Bacteroides fragilis NOT DETECTED NOT DETECTED  Final   Enterobacterales DETECTED (A) NOT DETECTED Final    Comment: Enterobacterales represent a large order of gram negative bacteria, not a single organism. Refer to culture for further identification. CRITICAL RESULT CALLED TO, READ BACK BY AND VERIFIED WITH: RN ROBERT MAYNARD 06/17/22'@00' :26 BY TW    Enterobacter cloacae complex NOT DETECTED NOT DETECTED Final   Escherichia coli NOT DETECTED NOT DETECTED Final   Klebsiella aerogenes NOT DETECTED NOT DETECTED Final   Klebsiella oxytoca NOT DETECTED NOT DETECTED Final   Klebsiella pneumoniae NOT DETECTED NOT DETECTED Final   Proteus species NOT DETECTED NOT DETECTED Final   Salmonella species NOT DETECTED NOT DETECTED Final   Serratia marcescens NOT DETECTED NOT DETECTED Final   Haemophilus influenzae NOT DETECTED NOT DETECTED Final   Neisseria meningitidis NOT DETECTED NOT DETECTED Final   Pseudomonas aeruginosa NOT DETECTED NOT DETECTED Final   Stenotrophomonas maltophilia NOT DETECTED NOT DETECTED Final   Candida albicans NOT DETECTED NOT DETECTED Final   Candida auris NOT DETECTED NOT DETECTED Final   Candida glabrata NOT DETECTED NOT DETECTED Final   Candida krusei NOT DETECTED NOT DETECTED Final   Candida parapsilosis NOT DETECTED NOT DETECTED Final   Candida tropicalis NOT DETECTED NOT DETECTED Final   Cryptococcus neoformans/gattii NOT DETECTED NOT DETECTED Final   CTX-M ESBL NOT DETECTED NOT DETECTED Final   Carbapenem resistance IMP NOT DETECTED NOT DETECTED Final   Carbapenem resistance KPC NOT DETECTED NOT DETECTED Final   Carbapenem resistance NDM NOT DETECTED NOT DETECTED Final   Carbapenem resist OXA 48 LIKE NOT DETECTED NOT DETECTED Final   Carbapenem resistance VIM  NOT DETECTED NOT DETECTED Final    Comment: Performed at [facility], [place] [address] [place], [place] [address]  Resp Panel by RT-PCR (Flu A&B, Covid) Anterior Nasal Swab     Status: None   Collection Time: 06/16/22  3:48 AM   Specimen: Anterior  Nasal Swab  Result Value Ref Range Status   SARS Coronavirus 2 by RT PCR NEGATIVE NEGATIVE Final    Comment: (NOTE) SARS-CoV-2 target nucleic acids are NOT DETECTED.  The SARS-CoV-2 RNA is generally detectable in upper respiratory specimens during the acute phase of infection. The lowest concentration of SARS-CoV-2 viral copies this assay can detect is 138 copies/mL. A negative result does not preclude SARS-Cov-2 infection and should not be used as the sole basis for treatment or other patient management decisions. A negative result may occur with  improper specimen collection/handling, submission of specimen other than nasopharyngeal swab, presence of viral mutation(s) within the areas targeted by this assay, and inadequate number of viral copies(<138 copies/mL). A negative result must be combined with clinical observations, patient history, and epidemiological information. The expected result is Negative.  Fact Sheet for Patients:  EntrepreneurPulse.com.au  Fact Sheet for Healthcare Providers:  IncredibleEmployment.be  This test is no t yet approved or cleared by the [place] FDA and  has been authorized for detection and/or diagnosis of SARS-CoV-2 by FDA under an Emergency Use Authorization (EUA). This EUA will remain  in effect (meaning this test can be used) for the duration of the COVID-19 declaration under Section 564(b)(1) of the Act, 21 U.S.C.section 360bbb-3(b)(1), unless the authorization is terminated  or revoked sooner.       Influenza A by PCR NEGATIVE NEGATIVE Final   Influenza B by PCR NEGATIVE NEGATIVE Final    Comment: (NOTE) The Xpert Xpress SARS-CoV-2/FLU/RSV plus assay is intended as an aid in the diagnosis of influenza from Nasopharyngeal swab specimens and should not be used as a sole basis for treatment. Nasal washings and aspirates are unacceptable for Xpert Xpress SARS-CoV-2/FLU/RSV testing.  Fact Sheet for  Patients: EntrepreneurPulse.com.au  Fact Sheet for Healthcare Providers: IncredibleEmployment.be  This test is not yet approved or cleared by the [place] FDA and has been authorized for detection and/or diagnosis of SARS-CoV-2 by FDA under an Emergency Use Authorization (EUA). This EUA will remain in effect (meaning this test can be used) for the duration of the COVID-19 declaration under Section 564(b)(1) of the Act, 21 U.S.C. section 360bbb-3(b)(1), unless the authorization is terminated or revoked.  Performed at [facility], [address], [place], [place] [address]   Blood Culture (routine x 2)     Status: Abnormal   Collection Time: 06/16/22  4:07 AM   Specimen: BLOOD RIGHT HAND  Result Value Ref Range Status   Specimen Description   Final    BLOOD RIGHT HAND Performed at [facility], [address], [place], [place] [address]    Special Requests   Final    BOTTLES DRAWN AEROBIC AND ANAEROBIC Blood Culture adequate volume Performed at [facility], [address] [place], [place] [address]    Culture  Setup Time   Final    GRAM NEGATIVE RODS BOTTLES DRAWN AEROBIC AND ANAEROBIC CRITICAL VALUE NOTED.  VALUE IS CONSISTENT WITH PREVIOUSLY REPORTED AND CALLED VALUE. Performed at [facility], [address], [place], [place] [address]    Culture (A)  Final    MORGANELLA MORGANII SUSCEPTIBILITIES PERFORMED ON PREVIOUS CULTURE WITHIN THE LAST 5 DAYS. Performed at [facility], [place] [address] [place], [place] [address]    Report Status 06/18/2022 FINAL  Final  MRSA Next Gen by PCR, Nasal     Status: None   Collection Time: 06/16/22  6:04 AM   Specimen: Nasal Mucosa; Nasal Swab  Result Value Ref Range Status   MRSA by PCR Next Gen NOT DETECTED NOT DETECTED Final    Comment: (NOTE) The GeneXpert MRSA Assay (FDA approved for NASAL specimens only), is one component of a comprehensive MRSA colonization surveillance program. It is  not intended to diagnose MRSA infection nor to guide or monitor treatment for MRSA infections. Test performance is not FDA approved in patients less than 9 years old. Performed at [facility], [address] [place], [place] [address]       Radiology Studies: DG CHEST PORT 1 VIEW  Result Date: 06/17/2022 CLINICAL DATA:  Dyspnea EXAM: PORTABLE CHEST 1 VIEW COMPARISON:  Chest radiograph from one day prior. FINDINGS: Right subclavian central venous catheter terminates over the cavoatrial junction. Stable cardiomediastinal silhouette with normal heart size. No pneumothorax. No pleural effusion. Mild bibasilar streaky opacities, similar, favor atelectasis. No pulmonary edema. IMPRESSION: Stable mild bibasilar streaky opacities, favor atelectasis. Electronically Signed   By: Ilona Sorrel M.D.   On: 06/17/2022 09:55     Scheduled Meds:  atorvastatin  40 mg Oral Daily   Chlorhexidine Gluconate Cloth  6 each Topical Q0600   enoxaparin (LOVENOX) injection  40 mg Subcutaneous Q24H   finasteride  5 mg Oral Daily   insulin aspart  0-5 Units Subcutaneous QHS   insulin aspart  0-6 Units Subcutaneous TID WC   pantoprazole  40 mg Oral Daily   Continuous Infusions:  sodium chloride Stopped (06/16/22 0730)   sodium chloride Stopped (06/18/22 1747)   fluconazole (DIFLUCAN) IV Stopped (06/18/22 1227)   meropenem (MERREM) IV 1 g (06/18/22 1748)   norepinephrine (LEVOPHED) Adult infusion 2 mcg/min (06/17/22 1843)   vasopressin 0.01 Units/min (06/17/22 1643)     LOS: 2 days    Barton Dubois M.D on 06/18/2022 at 6:42 PM  Go to [email or web address] - for contact info  [facility] - Office  [phone]  If 7PM-7AM, please contact night-coverage [email or web address] 06/18/2022, 6:42 PM

## 2022-06-18 NOTE — Progress Notes (Signed)
Patient ate apple sauce to help get blood sugar up.  Per Speech can have apple sauce and ice chips.

## 2022-06-18 NOTE — Progress Notes (Signed)
Hypoglycemic Event  CBG: 59  Treatment: D50 25 mL (12.5 gm)  Symptoms: Pale  Follow-up CBG: [id number] CBG Result:87  Possible Reasons for Event: Inadequate meal intake  Comments/MD notified: DO. Worthy Keeler    [name]

## 2022-06-18 NOTE — Progress Notes (Signed)
Speech Language Pathology Treatment: Dysphagia  Patient Details Name: Jared Bridges MRN: [id number] DOB: [birth year] Today's Date: 06/18/2022 Time: 1000-1025 SLP Time Calculation (min) (ACUTE ONLY): 25 min  Assessment / Plan / Recommendation Clinical Impression  Pt seen for ongoing dysphagia intervention following clinical swallow evaluation completed yesterday. Pt alert, cooperative, but requires cueing and responds to questions and commands with delays. Pt with congested cough prior to po administration. He consumed ice chips, thin water via tsp/cup/straw, puree, and medications whole in puree. Pt with some delayed coughing, however not thought to be related to po intake of ice chips or puree. He did present with immediate cough x2 after straw sips of thin water. He exhibited oral holding and suspected delayed swallow and multiple swallows at times. He has a poor cough and poor respiratory support at this time which negatively impacts his ability to protect his airway. Would like to give him another day before trying to complete MBSS tomorrow. Can give PO medication whole in puree and give him the rest of the container if he desires, oral care and small sips of water and ice chips ok as well. SLP will check back tomorrow AM.   HPI HPI: Jared Bridges is a 88 y.o. male with medical history significant of hypertension, hyperlipidemia, T2DM, prostate cancer with osseous metastasis, BPH, hearing impairment, diabetic polyneuropathy who presents to the emergency department via EMS from home due to confusion and weakness.  Patient was unable to provide history, history was obtained from ED physician and ED medical record.  Per report, daughter activated EMS stating that patient has progressively become weaker and has been retaining urine. Chest x-ray showed no acute chest findings.      SLP Plan  Continue with current plan of care      Recommendations for follow up therapy are one component of a  multi-disciplinary discharge planning process, led by the attending physician.  Recommendations may be updated based on patient status, additional functional criteria and insurance authorization.    Recommendations  Diet recommendations: NPO (NPO except meds in puree and ice chips, small sips of water prn after oral care) Medication Administration: Whole meds with puree                Oral Care Recommendations: Oral care QID;Oral care prior to ice chip/H20 Follow Up Recommendations:  (pending clinical course) Assistance recommended at discharge: Frequent or constant Supervision/Assistance SLP Visit Diagnosis: Dysphagia, unspecified (R13.10) Plan: Continue with current plan of care         Thank you,  Genene Churn, Imogene   Woodward  06/18/2022, 12:31 PM

## 2022-06-18 NOTE — Progress Notes (Signed)
Palliative:  HPI: HPI: 88 y.o. male  with past medical history of prostate cancer with bone mets (follows at [facility] and on [place]), h/o fall with C7 SCI and stay in [facility] Aug 2022, wheelchair bound, stroke, migraines, HTN, HLD, orthostatic hypotension, polymalgia rheumatica, diabetes admitted on 06/15/2022 with confusion and weakness with UTI sepsis.   I meet again at Jared Bridges bedside with wife and daughter, Leda Gauze and Jared Bridges. Mr. Schwager is more alert today - he will respond to most questions but with delayed response and mostly echoed responses. He does indicate that he thinks he feels better. He smiles and interacts with his family. He is off vasopressor support and WBC is slightly improved. He is able to cough but struggling to expectorate and took some effort for him to follow through with request for cough. Reviewed with family SLP evaluation and plans. Family is pleased with his progress and hoping he will be able to have diet tomorrow.   Jared Bridges expresses concern that her father will awaken and how he will feel about her making him DNR. They had much conversation regarding code status during past hospitalization and CIR stay last year. He had a MOST form he had signed in 2017 that indicated DNR but last year [name] and family decided they desired full code at that time. I reviewed with Jared Bridges health changes as well as expectations and poor prognosis if [name] were to have require resuscitation for any reason at this point in his journey. She expresses understanding and wishes to maintain DNR for now. I reassured her that if [name] is able to improve to a mental and cognitive state to discuss his wishes we will assist with this conversation. Jared Bridges was pleased to know this. We will continue to take one day at a time. I provided Jared Bridges with a copy of [employer] Choices booklet.   All questions/concerns addressed. Emotional support provided.   Exam: More alert. Provides short verbal responses with much  delay. No distress. Smiles. Breathing regular, unlabored. Urine more clear yellow today. Edema and bruising to upper extremities.   Plan: - DNR maintained. Hopefully [name] can participate in goals of care discussion in the future.  - Ongoing support provided to family at bedside.   [place], NP Palliative Medicine Team Pager [phone] (Please see [email or web address] for schedule) Team Phone [phone]    Greater than 50%  of this time was spent counseling and coordinating care related to the above assessment and plan

## 2022-06-19 DIAGNOSIS — N401 Enlarged prostate with lower urinary tract symptoms: Secondary | ICD-10-CM | POA: Diagnosis not present

## 2022-06-19 DIAGNOSIS — Z515 Encounter for palliative care: Secondary | ICD-10-CM | POA: Diagnosis not present

## 2022-06-19 DIAGNOSIS — I5032 Chronic diastolic (congestive) heart failure: Secondary | ICD-10-CM | POA: Diagnosis not present

## 2022-06-19 DIAGNOSIS — R6521 Severe sepsis with septic shock: Secondary | ICD-10-CM | POA: Diagnosis not present

## 2022-06-19 DIAGNOSIS — G9340 Encephalopathy, unspecified: Secondary | ICD-10-CM | POA: Diagnosis not present

## 2022-06-19 DIAGNOSIS — Z7189 Other specified counseling: Secondary | ICD-10-CM | POA: Diagnosis not present

## 2022-06-19 DIAGNOSIS — I10 Essential (primary) hypertension: Secondary | ICD-10-CM | POA: Diagnosis not present

## 2022-06-19 DIAGNOSIS — A419 Sepsis, unspecified organism: Secondary | ICD-10-CM | POA: Diagnosis not present

## 2022-06-19 LAB — GLUCOSE, CAPILLARY
Glucose-Capillary: 68 mg/dL — ABNORMAL LOW (ref 70–99)
Glucose-Capillary: 60 mg/dL — ABNORMAL LOW (ref 70–99)
Glucose-Capillary: 91 mg/dL (ref 70–99)
Glucose-Capillary: 83 mg/dL (ref 70–99)
Glucose-Capillary: 82 mg/dL (ref 70–99)

## 2022-06-19 MED ORDER — DEXTROSE 50 % IV SOLN
INTRAVENOUS | Status: AC
  Filled 2022-06-19: qty 50

## 2022-06-19 MED ORDER — DEXTROSE 50 % IV SOLN
12.5000 g | INTRAVENOUS | Status: AC
Start: 2022-06-19 — End: 2022-06-19
  Administered 2022-06-19: 12.5 g via INTRAVENOUS

## 2022-06-19 NOTE — Progress Notes (Signed)
PROGRESS NOTE     Jared Bridges, is a 88 y.o. male, DOB - 17-Jul-1930, KGY:[id number]  Admit date - 06/15/2022   Admitting Physician Bernadette Hoit, DO  Outpatient Primary MD for the patient is [name], MD  LOS - 3  Chief Complaint  Patient presents with   Weakness        Brief Narrative:  Brief Summary:-  88 y.o. male with medical history significant of hypertension, hyperlipidemia, T2DM, prostate cancer with osseous metastasis, BPH, hearing impairment, diabetic polyneuropathy admitted on 06/16/2022 with septic shock due to Morganella morganii bacteremia presumably from urinary source    -Assessment and Plan:     A/p 1) Morganella Morgagni septic shock from presumed urinary source--POA -Patient met sepsis criteria on admission -Lactic acidosis and leukocytosis noted WBC 12.6 >>>39.7 Lactic acid 5.4 >>2.0 -Required Levophed and vasopressin for pressure support as well as IV fluids until 06/18/2022; patient currently off pressors with stable vital signs. -Continue treatment with meropenem; multidrug-resistant results appreciated by sensitivity.  Possible option for patient is the use of Bactrim when able to take by mouth at time of discharge. -Planning for evaluation by speech therapy later today.   2) history of urinary retention/BPH with LUTS--- unable to void, given hemodynamic instability and the need to monitor urine output accurately Foley catheter/coud catheter placed on 06/16/2022 with 400 mL of urine removed -Continue to hold Proscar in the setting of hypotension -Continue to closely follow urine output. -Will attempt voiding trial once medically stable; if patient failed voiding trial will discharge with catheter in place and outpatient follow-up with urology service.[name] Kitchen   3) social/ethics--- plan of care discussed with patient's daughter and wife -Patient is a DNR/DNI without limitations to treatment at this time. -Palliative care consult appreciated, will  follow further recommendations. -Overall prognosis is guarded  4)HFpEF--- prior echo with EF of 65% with grade 1 diastolic dysfunction -Patient continues to require IV fluids at this time due to sepsis. -Continue to closely follow volume status as patient is at risk for fluid overload.  5) acute on chronic anemia--- patient with anemia of chronic disease. -Hgb down to 8.7 suspect due to hemodilution due to IV fluids for sepsis. -No overt bleeding appreciated -Continue to closely follow hemoglobin trend and transfuse as needed.  6)DM2--- prior A1c 6.4 reflecting good diabetic control PTA -Continue sliding scale insulin and close monitoring of patient's CBGs to further adjust management as needed.  7) dysphagia--- speech pathologist assistance and recommendations appreciated. -Planning for MBS once more alert and interactive. -Patient appropriate for speech evaluation later today; will follow recommendations.   CRITICAL CARE Performed by: Barton Dubois   Total critical care time: 40 minutes  Critical care time was exclusive of separately billable procedures and treating other patients. - Morganella morganii septic shock/bacteremia--- with prior requirement of IV pressor support (currently off) and the need of IV antibiotics.  Slowly but adequately responding.  Critical care was necessary to treat or prevent imminent or life-threatening deterioration.  Critical care was time spent personally by me on the following activities: development of treatment plan with patient and/or surrogate as well as nursing, discussions with consultants, evaluation of patient's response to treatment, examination of patient, obtaining history from patient or surrogate, ordering and performing treatments and interventions, ordering and review of laboratory studies, ordering and review of radiographic studies, pulse oximetry and re-evaluation of patient's condition.   Disposition/Need for in-Hospital Stay-  patient unable to be discharged at this time due to --Morganella morganii septic  shock requiring IV antibiotics.  Currently off pressors; but is still not completely out of the woods.  Pending evaluation by speech therapy.  Status is: Inpatient   Disposition: The patient is from: Home              Anticipated d/c is to:  TBD              Anticipated d/c date is: > 3 days              Patient currently is not medically stable to d/c.  Barriers: Not Clinically Stable-   Code Status :  -  Code Status: DNR   Family Communication:   No family at bedside at time of my evaluation.  DVT Prophylaxis  :   - SCDs  enoxaparin (LOVENOX) injection 40 mg Start: 06/16/22 2200 SCDs Start: 06/16/22 0624   Lab Results  Component Value Date   PLT 115 (L) 06/18/2022    Inpatient Medications  Scheduled Meds:  atorvastatin  40 mg Oral Daily   Chlorhexidine Gluconate Cloth  6 each Topical Q0600   enoxaparin (LOVENOX) injection  40 mg Subcutaneous Q24H   finasteride  5 mg Oral Daily   insulin aspart  0-5 Units Subcutaneous QHS   insulin aspart  0-6 Units Subcutaneous TID WC   pantoprazole  40 mg Oral Daily   Continuous Infusions:  sodium chloride Stopped (06/16/22 0730)   sodium chloride 100 mL/hr at 06/19/22 1435   fluconazole (DIFLUCAN) IV 200 mg (06/19/22 1102)   meropenem (MERREM) IV 1 g (06/19/22 1723)   norepinephrine (LEVOPHED) Adult infusion 2 mcg/min (06/17/22 1843)   vasopressin 0.01 Units/min (06/17/22 1643)   PRN Meds:.acetaminophen **OR** acetaminophen   Anti-infectives (From admission, onward)    Start     Dose/Rate Route Frequency Ordered Stop   06/18/22 1000  meropenem (MERREM) 1 g in sodium chloride 0.9 % 100 mL IVPB        1 g 200 mL/hr over 30 Minutes Intravenous Every 8 hours 06/18/22 0855     06/17/22 1530  meropenem (MERREM) 1 g in sodium chloride 0.9 % 100 mL IVPB  Status:  Discontinued        1 g 200 mL/hr over 30 Minutes Intravenous Every 12 hours 06/17/22 1431  06/18/22 0855   06/16/22 1615  ceFEPIme (MAXIPIME) 2 g in sodium chloride 0.9 % 100 mL IVPB  Status:  Discontinued        2 g 200 mL/hr over 30 Minutes Intravenous Every 12 hours 06/16/22 0400 06/16/22 0441   06/16/22 1600  ceFEPIme (MAXIPIME) 2 g in sodium chloride 0.9 % 100 mL IVPB  Status:  Discontinued        2 g 200 mL/hr over 30 Minutes Intravenous Every 12 hours 06/16/22 0441 06/17/22 1431   06/16/22 1000  fluconazole (DIFLUCAN) IVPB 200 mg        200 mg 100 mL/hr over 60 Minutes Intravenous Every 24 hours 06/16/22 0836     06/16/22 0400  ceFEPIme (MAXIPIME) 2 g in sodium chloride 0.9 % 100 mL IVPB        2 g 200 mL/hr over 30 Minutes Intravenous  Once 06/16/22 0348 06/16/22 0451         Subjective: April Carlyon appears more interactive, oriented x2 and able to follow commands.  Has remained in no need for pressors of for port.  Blood pressure soft but stable.  Good oxygen saturation on 2 L.  Patient reports no fever,  no chest pain, no nausea, no vomiting.  Objective: Vitals:   06/19/22 1500 06/19/22 1600 06/19/22 1700 06/19/22 1800  BP: (!) 110/44 (!) 120/46 (!) 115/44 (!) 120/35  Pulse: 61 63 65 62  Resp: (!) '24 19 19 ' (!) 26  Temp:      TempSrc:      SpO2: 100% 98% 99% 99%  Weight:      Height:        Intake/Output Summary (Last 24 hours) at 06/19/2022 1828 Last data filed at 06/19/2022 1121 Gross per 24 hour  Intake 1018.6 ml  Output 375 ml  Net 643.6 ml   Filed Weights   06/17/22 0500 06/18/22 0500 06/19/22 0500  Weight: 86 kg 87.4 kg 89.7 kg    Physical Exam General exam: Alert, awake, oriented x 2; following commands and more interactive.  Denies no chest pain, no nausea, no vomiting, no abdominal pain. Respiratory system: Good saturation on 2 L supplementation; no using accessory muscles. Cardiovascular system:RRR. No rubs, no gallops, no JVD appreciated on exam. Gastrointestinal system: Abdomen is nondistended, soft and nontender. No organomegaly or  masses felt. Normal bowel sounds heard. Central nervous system: No focal neurologic deficits appreciated; patient is generally weak. Extremities: No cyanosis or clubbing. Skin: No petechiae.  Foley catheter in place. Psychiatry: Mood & affect appropriate.    Data Reviewed: I have personally reviewed following labs and imaging studies  CBC: Recent Labs  Lab 06/15/22 2213 06/17/22 0338 06/18/22 0359  WBC 12.6* 39.7* 27.6*  HGB 10.5* 8.7* 8.5*  HCT 33.3* 27.8* 27.1*  MCV 90.5 92.1 91.9  PLT 238 158 503*   Basic Metabolic Panel: Recent Labs  Lab 06/15/22 2213 06/16/22 0401 06/17/22 0338 06/18/22 0359  NA 137  --  136 138  K 3.8  --  3.9 3.2*  CL 106  --  109 111  CO2 24  --  21* 23  GLUCOSE 139*  --  126* 76  BUN 27*  --  32* 34*  CREATININE 1.15  --  1.24 0.87  CALCIUM 9.1  --  8.1* 7.8*  MG  --  1.9  --   --   PHOS  --  3.0  --   --    GFR: Estimated Creatinine Clearance: 63.8 mL/min (by C-G formula based on SCr of 0.87 mg/dL).  Liver Function Tests: Recent Labs  Lab 06/16/22 0401 06/17/22 0338 06/18/22 0359  AST 15 33 26  ALT '9 16 14  ' ALKPHOS 69 47 58  BILITOT 1.0 0.6 0.6  PROT 5.9* 5.0* 5.0*  ALBUMIN 2.6* 2.1* 2.0*   Sepsis Labs:  Recent Results (from the past 240 hour(s))  Urine Culture     Status: Abnormal   Collection Time: 06/15/22  9:37 PM   Specimen: In/Out Cath Urine  Result Value Ref Range Status   Specimen Description   Final    IN/OUT CATH URINE Performed at [facility], [address], [place], [place] [address]    Special Requests   Final    NONE Performed at [facility], [address] [place], [place] [address]    Culture MULTIPLE SPECIES PRESENT, SUGGEST RECOLLECTION (A)  Final   Report Status 06/17/2022 FINAL  Final  Blood Culture (routine x 2)     Status: Abnormal   Collection Time: 06/16/22  3:38 AM   Specimen: BLOOD RIGHT ARM  Result Value Ref Range Status   Specimen Description   Final    BLOOD RIGHT ARM Performed at  [facility], [address] [place], [place] [address]    Special Requests   Final    BOTTLES DRAWN AEROBIC AND ANAEROBIC Blood Culture adequate volume Performed at [facility], [address] [place], [place] [address]    Culture  Setup Time   Final    GRAM NEGATIVE RODS BOTTLES DRAWN AEROBIC AND ANAEROBIC Gram Stain Report Called to,Read Back By and Verified With: Quentin Angst RN [phone] K [facility] CRITICAL RESULT CALLED TO, READ BACK BY AND VERIFIED WITH: RN ROBERT MAYNARD 06/17/22'@00' :26 BY TW Performed at [facility], [place] [address], [place],  [address]    Culture [facility] MORGANII (A)  Final   Report Status 06/18/2022 FINAL  Final   Organism ID, Bacteria MORGANELLA MORGANII  Final      Susceptibility   Morganella morganii - MIC*    AMPICILLIN >=32 RESISTANT Resistant     CEFAZOLIN >=64 RESISTANT Resistant     CEFTAZIDIME 16 INTERMEDIATE Intermediate     CIPROFLOXACIN <=0.25 SENSITIVE Sensitive     GENTAMICIN <=1 SENSITIVE Sensitive     IMIPENEM 4 SENSITIVE Sensitive     TRIMETH/SULFA <=20 SENSITIVE Sensitive     AMPICILLIN/SULBACTAM >=32 RESISTANT Resistant     PIP/TAZO <=4 SENSITIVE Sensitive     * MORGANELLA MORGANII  Blood Culture ID Panel (Reflexed)     Status: Abnormal   Collection Time: 06/16/22  3:38 AM  Result Value Ref Range Status   Enterococcus faecalis NOT DETECTED NOT DETECTED Final   Enterococcus Faecium NOT DETECTED NOT DETECTED Final   Listeria monocytogenes NOT DETECTED NOT DETECTED Final   Staphylococcus species NOT DETECTED NOT DETECTED Final   Staphylococcus aureus (BCID) NOT DETECTED NOT DETECTED Final   Staphylococcus epidermidis NOT DETECTED NOT DETECTED Final   Staphylococcus lugdunensis NOT DETECTED NOT DETECTED Final   Streptococcus species NOT DETECTED NOT DETECTED Final   Streptococcus agalactiae NOT DETECTED NOT DETECTED Final   Streptococcus pneumoniae NOT DETECTED NOT DETECTED Final   Streptococcus pyogenes NOT DETECTED  NOT DETECTED Final   A.calcoaceticus-baumannii NOT DETECTED NOT DETECTED Final   Bacteroides fragilis NOT DETECTED NOT DETECTED Final   Enterobacterales DETECTED (A) NOT DETECTED Final    Comment: Enterobacterales represent a large order of gram negative bacteria, not a single organism. Refer to culture for further identification. CRITICAL RESULT CALLED TO, READ BACK BY AND VERIFIED WITH: RN ROBERT MAYNARD 06/17/22'@00' :26 BY TW    Enterobacter cloacae complex NOT DETECTED NOT DETECTED Final   Escherichia coli NOT DETECTED NOT DETECTED Final   Klebsiella aerogenes NOT DETECTED NOT DETECTED Final   Klebsiella oxytoca NOT DETECTED NOT DETECTED Final   Klebsiella pneumoniae NOT DETECTED NOT DETECTED Final   Proteus species NOT DETECTED NOT DETECTED Final   Salmonella species NOT DETECTED NOT DETECTED Final   Serratia marcescens NOT DETECTED NOT DETECTED Final   Haemophilus influenzae NOT DETECTED NOT DETECTED Final   Neisseria meningitidis NOT DETECTED NOT DETECTED Final   Pseudomonas aeruginosa NOT DETECTED NOT DETECTED Final   Stenotrophomonas maltophilia NOT DETECTED NOT DETECTED Final   Candida albicans NOT DETECTED NOT DETECTED Final   Candida auris NOT DETECTED NOT DETECTED Final   Candida glabrata NOT DETECTED NOT DETECTED Final   Candida krusei NOT DETECTED NOT DETECTED Final   Candida parapsilosis NOT DETECTED NOT DETECTED Final   Candida tropicalis NOT DETECTED NOT DETECTED Final   Cryptococcus neoformans/gattii NOT DETECTED NOT DETECTED Final   CTX-M ESBL NOT DETECTED NOT DETECTED Final   Carbapenem resistance  IMP NOT DETECTED NOT DETECTED Final   Carbapenem resistance KPC NOT DETECTED NOT DETECTED Final   Carbapenem resistance NDM NOT DETECTED NOT DETECTED Final   Carbapenem resist OXA 48 LIKE NOT DETECTED NOT DETECTED Final   Carbapenem resistance VIM NOT DETECTED NOT DETECTED Final    Comment: Performed at [facility], [place] [address] [place], [place] [address]  Resp  Panel by RT-PCR (Flu A&B, Covid) Anterior Nasal Swab     Status: None   Collection Time: 06/16/22  3:48 AM   Specimen: Anterior Nasal Swab  Result Value Ref Range Status   SARS Coronavirus 2 by RT PCR NEGATIVE NEGATIVE Final    Comment: (NOTE) SARS-CoV-2 target nucleic acids are NOT DETECTED.  The SARS-CoV-2 RNA is generally detectable in upper respiratory specimens during the acute phase of infection. The lowest concentration of SARS-CoV-2 viral copies this assay can detect is 138 copies/mL. A negative result does not preclude SARS-Cov-2 infection and should not be used as the sole basis for treatment or other patient management decisions. A negative result may occur with  improper specimen collection/handling, submission of specimen other than nasopharyngeal swab, presence of viral mutation(s) within the areas targeted by this assay, and inadequate number of viral copies(<138 copies/mL). A negative result must be combined with clinical observations, patient history, and epidemiological information. The expected result is Negative.  Fact Sheet for Patients:  EntrepreneurPulse.com.au  Fact Sheet for Healthcare Providers:  IncredibleEmployment.be  This test is no t yet approved or cleared by the [place] FDA and  has been authorized for detection and/or diagnosis of SARS-CoV-2 by FDA under an Emergency Use Authorization (EUA). This EUA will remain  in effect (meaning this test can be used) for the duration of the COVID-19 declaration under Section 564(b)(1) of the Act, 21 U.S.C.section 360bbb-3(b)(1), unless the authorization is terminated  or revoked sooner.       Influenza A by PCR NEGATIVE NEGATIVE Final   Influenza B by PCR NEGATIVE NEGATIVE Final    Comment: (NOTE) The Xpert Xpress SARS-CoV-2/FLU/RSV plus assay is intended as an aid in the diagnosis of influenza from Nasopharyngeal swab specimens and should not be used as a sole  basis for treatment. Nasal washings and aspirates are unacceptable for Xpert Xpress SARS-CoV-2/FLU/RSV testing.  Fact Sheet for Patients: EntrepreneurPulse.com.au  Fact Sheet for Healthcare Providers: IncredibleEmployment.be  This test is not yet approved or cleared by the [place] FDA and has been authorized for detection and/or diagnosis of SARS-CoV-2 by FDA under an Emergency Use Authorization (EUA). This EUA will remain in effect (meaning this test can be used) for the duration of the COVID-19 declaration under Section 564(b)(1) of the Act, 21 U.S.C. section 360bbb-3(b)(1), unless the authorization is terminated or revoked.  Performed at [facility], [address], [place], [place] [address]   Blood Culture (routine x 2)     Status: Abnormal   Collection Time: 06/16/22  4:07 AM   Specimen: BLOOD RIGHT HAND  Result Value Ref Range Status   Specimen Description   Final    BLOOD RIGHT HAND Performed at [facility], [address], [place], [place] [address]    Special Requests   Final    BOTTLES DRAWN AEROBIC AND ANAEROBIC Blood Culture adequate volume Performed at [facility], [address], [place], [place] [address]    Culture  Setup Time   Final    GRAM NEGATIVE RODS BOTTLES DRAWN AEROBIC AND ANAEROBIC CRITICAL VALUE NOTED.  VALUE IS  CONSISTENT WITH PREVIOUSLY REPORTED AND CALLED VALUE. Performed at [facility], [address], [place], [place] [address]    Culture (A)  Final    MORGANELLA MORGANII SUSCEPTIBILITIES PERFORMED ON PREVIOUS CULTURE WITHIN THE LAST 5 DAYS. Performed at [facility], [place] [address], [place], [place] [address]    Report Status 06/18/2022 FINAL  Final  MRSA Next Gen by PCR, Nasal     Status: None   Collection Time: 06/16/22  6:04 AM   Specimen: Nasal Mucosa; Nasal Swab  Result Value Ref Range Status   MRSA by PCR Next Gen NOT DETECTED NOT DETECTED Final    Comment: (NOTE) The GeneXpert MRSA  Assay (FDA approved for NASAL specimens only), is one component of a comprehensive MRSA colonization surveillance program. It is not intended to diagnose MRSA infection nor to guide or monitor treatment for MRSA infections. Test performance is not FDA approved in patients less than 62 years old. Performed at [facility], [address], [place],  [address]       Radiology Studies: No results found.   Scheduled Meds:  atorvastatin  40 mg Oral Daily   Chlorhexidine Gluconate Cloth  6 each Topical Q0600   enoxaparin (LOVENOX) injection  40 mg Subcutaneous Q24H   finasteride  5 mg Oral Daily   insulin aspart  0-5 Units Subcutaneous QHS   insulin aspart  0-6 Units Subcutaneous TID WC   pantoprazole  40 mg Oral Daily   Continuous Infusions:  sodium chloride Stopped (06/16/22 0730)   sodium chloride 100 mL/hr at 06/19/22 1435   fluconazole (DIFLUCAN) IV 200 mg (06/19/22 1102)   meropenem (MERREM) IV 1 g (06/19/22 1723)   norepinephrine (LEVOPHED) Adult infusion 2 mcg/min (06/17/22 1843)   vasopressin 0.01 Units/min (06/17/22 1643)     LOS: 3 days    Barton Dubois M.D on 06/19/2022 at 6:28 PM  Go to [email or web address] - for contact info  [facility] - Office  [phone]  If 7PM-7AM, please contact night-coverage [email or web address] 06/19/2022, 6:28 PM

## 2022-06-19 NOTE — Progress Notes (Signed)
Palliative:  HPI: 88 y.o. male  with past medical history of prostate cancer with bone mets (follows at [facility] and on [place]), h/o fall with C7 SCI and stay in [facility] Aug 2022, wheelchair bound, stroke, migraines, HTN, HLD, orthostatic hypotension, polymalgia rheumatica, diabetes admitted on 06/15/2022 with confusion and weakness with UTI sepsis. Continues with slow improvements.   I met today with Jared Bridges, wife Jared Bridges, and daughter Jared Bridges. He is much more alert and continues to be interactive but still with delay. Still with some difficulty expressing and responding at times. I believe he will likely be able to tolerate diet and plans for SLP MBS today. His cough is stronger and ale to expectorate to yankeur suction at times (with much direction). Jared Bridges continues to be concerned about DNR and wanting to know his wishes but knows that he is not in a cognitive state to discuss this at this time. She opts to maintain DNR at this time and we plan to discuss further with Jared Bridges when he is able to have this conversation. He seems to be progressing well at this time and next step is nutrition.   All questions/concerns addressed. Emotional support provided.   Exam: More alert. Provides short verbal responses with continued delay. Stronger cough. No distress. Smiles. Breathing regular, unlabored. Urine clear yellow today. Edema and bruising to upper extremities.    Plan: - DNR maintained. Hopefully Jared Bridges can participate in goals of care discussion in the future.  - Ongoing support provided to family at bedside.    25 min  Vinie Sill, NP Palliative Medicine Team Pager [phone] (Please see [email or web address] for schedule) Team Phone [phone]    Greater than 50%  of this time was spent counseling and coordinating care related to the above assessment and plan

## 2022-06-19 NOTE — Progress Notes (Signed)
Speech Language Pathology Treatment: Dysphagia  Patient Details Name: Jared Bridges MRN: [id number] DOB: 1930/04/28 Today's Date: 06/19/2022 Time: 6546-5035 SLP Time Calculation (min) (ACUTE ONLY): 28 min  Assessment / Plan / Recommendation Clinical Impression  Pt seen for ongoing dysphagia intervention and demonstrates improved alertness and response time. Pt's wife and daughter present for the visit. Pt assessed with ice chips, thin water via tsp/cup/straw, NTL cup/straw, puree, mech soft, and regular textures. Pt still has congested cough prior to PO administration, however he was able elicit volitional cough when requested today (yesterday unable). Pt continues to present with periodic oral holding due to suspected altered mental status and occasional cough after cup/straw sips thin. Pt with mild lingual residue with solids, which cleared with liquid wash. Pt with seemingly more timely swallow with NTL and no coughing. Pt continues to present with cognitive based dysphagia, but with improvement from previous days. Recommend D3/mech soft with NTL with 100% feeder assist, reduce distractions, complete oral care before and after PO, ok for PO medications whole in puree. SLP will f/u tomorrow to determine if able to clinically upgrade to thin liquids, or if MBSS warranted. Pt/family in agreement with plan of care.    HPI HPI: Jared Bridges is a 88 y.o. male with medical history significant of hypertension, hyperlipidemia, T2DM, prostate cancer with osseous metastasis, BPH, hearing impairment, diabetic polyneuropathy who presents to the emergency department via EMS from home due to confusion and weakness.  Patient was unable to provide history, history was obtained from ED physician and ED medical record.  Per report, daughter activated EMS stating that patient has progressively become weaker and has been retaining urine. Chest x-ray showed no acute chest findings.      SLP Plan  Continue with  current plan of care      Recommendations for follow up therapy are one component of a multi-disciplinary discharge planning process, led by the attending physician.  Recommendations may be updated based on patient status, additional functional criteria and insurance authorization.    Recommendations  Diet recommendations: Dysphagia 3 (mechanical soft);Nectar-thick liquid Liquids provided via: Cup;Straw Medication Administration: Whole meds with puree Supervision: Staff to assist with self feeding;Full supervision/cueing for compensatory strategies Compensations: Minimize environmental distractions;Slow rate;Small sips/bites;Multiple dry swallows after each bite/sip;Clear throat intermittently Postural Changes and/or Swallow Maneuvers: Seated upright 90 degrees;Upright 30-60 min after meal                Oral Care Recommendations: Oral care BID;Staff/trained caregiver to provide oral care Follow Up Recommendations:  (pending clinical course) Assistance recommended at discharge: Frequent or constant Supervision/Assistance SLP Visit Diagnosis: Dysphagia, unspecified (R13.10) Plan: Continue with current plan of care         Thank you,  Genene Churn, [place]  06/19/2022, 2:57 PM

## 2022-06-19 NOTE — Progress Notes (Signed)
Hypoglycemic Event  CBG: 60  Treatment: D50 25 mL (12.5 gm)  Symptoms: None  Follow-up CBG: Time:12:09 CBG Result: 91  Possible Reasons for Event: NPO  Comments/MD notified: Dr Veryl Speak

## 2022-06-20 DIAGNOSIS — G9340 Encephalopathy, unspecified: Secondary | ICD-10-CM | POA: Diagnosis not present

## 2022-06-20 DIAGNOSIS — I5032 Chronic diastolic (congestive) heart failure: Secondary | ICD-10-CM | POA: Diagnosis not present

## 2022-06-20 DIAGNOSIS — I10 Essential (primary) hypertension: Secondary | ICD-10-CM | POA: Diagnosis not present

## 2022-06-20 DIAGNOSIS — N401 Enlarged prostate with lower urinary tract symptoms: Secondary | ICD-10-CM | POA: Diagnosis not present

## 2022-06-20 LAB — GLUCOSE, CAPILLARY
Glucose-Capillary: 75 mg/dL (ref 70–99)
Glucose-Capillary: 83 mg/dL (ref 70–99)
Glucose-Capillary: 95 mg/dL (ref 70–99)
Glucose-Capillary: 82 mg/dL (ref 70–99)

## 2022-06-20 MED ORDER — SODIUM CHLORIDE 0.9 % IV SOLN
1.0000 g | Freq: Three times a day (TID) | INTRAVENOUS | Status: DC
  Administered 2022-06-20 – 2022-06-23 (×10): 1 g via INTRAVENOUS
  Filled 2022-06-20 (×10): qty 20

## 2022-06-20 NOTE — Plan of Care (Signed)
  Problem: Acute Rehab PT Goals(only PT should resolve) Goal: Pt Will Go Supine/Side To Sit Outcome: Progressing Flowsheets (Taken 06/20/2022 1549) Pt will go Supine/Side to Sit: with moderate assist Goal: Patient Will Transfer Sit To/From Stand Outcome: Progressing Flowsheets (Taken 06/20/2022 1549) Patient will transfer sit to/from stand: with moderate assist Goal: Pt Will Transfer Bed To Chair/Chair To Bed Outcome: Progressing Flowsheets (Taken 06/20/2022 1549) Pt will Transfer Bed to Chair/Chair to Bed: with mod assist Goal: Pt Will Ambulate Outcome: Progressing Flowsheets (Taken 06/20/2022 1549) Pt will Ambulate:  15 feet  with moderate assist  with rolling walker   3:50 PM, 06/20/22 Lonell Grandchild, MPT Physical Therapist with [facility] [phone] office [phone] mobile phone

## 2022-06-20 NOTE — Progress Notes (Signed)
PROGRESS NOTE     Jared Bridges, is a 88 y.o. male, DOB - 12-Feb-1930, SWN:[id number]  Admit date - 06/15/2022   Admitting Physician Bernadette Hoit, DO  Outpatient Primary MD for the patient is [name], MD  LOS - 4  Chief Complaint  Patient presents with   Weakness        Brief Narrative:  Brief Summary:-  88 y.o. male with medical history significant of hypertension, hyperlipidemia, T2DM, prostate cancer with osseous metastasis, BPH, hearing impairment, diabetic polyneuropathy admitted on 06/16/2022 with septic shock due to Morganella morganii bacteremia presumably from urinary source    -Assessment and Plan:   A/p 1) Morganella Morgagni septic shock from presumed urinary source--POA -Patient met sepsis criteria on admission -Lactic acidosis and leukocytosis noted WBC 12.6 >>>39.7 Lactic acid 5.4 >>2.0 -Required Levophed and vasopressin for pressure support as well as IV fluids until 06/18/2022; patient currently off pressors with stable vital signs. -Continue treatment with meropenem; multidrug-resistant results appreciated by sensitivity.  Possible option for patient is the use of Bactrim when able to take by mouth at time of discharge. -Planning for evaluation by speech therapy later today.   2) history of urinary retention/BPH with LUTS--- unable to void, given hemodynamic instability and the need to monitor urine output accurately Foley catheter/coud catheter placed on 06/16/2022 with 400 mL of urine removed -Now that his blood pressure has improved and is no longer requiring pressors Proscar will be resume. -Continue to closely follow urine output. -Will attempt voiding trial once medically stable; if patient failed voiding trial will discharge with catheter in place and outpatient follow-up with urology service.[name] Kitchen   3) social/ethics--- plan of care discussed with patient's daughter and wife -Patient DNR/DNI CODE STATUS has been revoked after discussing with  patient's daughter.  She would like to see how the patient's continue to to work/respond prior to make any final decisions.  She will continue talking with patient itself and the rest of the family to come up with a living will. -Palliative care consult appreciated, will follow further recommendations. -Overall prognosis is guarded  4)HFpEF--- prior echo with EF of 65% with grade 1 diastolic dysfunction -Patient continues to require IV fluids at this time due to sepsis. -Continue to closely follow volume status as patient is at risk for fluid overload.  5) acute on chronic anemia--- patient with anemia of chronic disease. -Hgb down to 8.7 suspect due to hemodilution due to IV fluids for sepsis. -No overt bleeding appreciated -Continue to closely follow hemoglobin trend and transfuse as needed. -Planning for CBC on 06/21/2022 to follow trend.  6)DM2--- prior A1c 6.4 reflecting good diabetic control PTA -Continue sliding scale insulin and close monitoring of patient's CBGs to further adjust management as needed.  7) dysphagia--- speech pathologist assistance and recommendations appreciated. -Planning for MBS once more alert and interactive; tentatively scheduled for 06/23/2022.. -Patient's diet has been advanced to dysphagia 3 with nectar thick liquids based on bedside evaluation.   CRITICAL CARE Performed by: Barton Dubois   Total critical care time: 40 minutes  Critical care time was exclusive of separately billable procedures and treating other patients. - Morganella morganii septic shock/bacteremia--- with prior requirement of IV pressor support (currently off) and the need of IV antibiotics.  Slowly but adequately responding.  After long discussion with patient's daughter decision has been made to revoke prior DNR status and continue to follow daily basis and his progression.  Critical care was necessary to treat or prevent imminent or  life-threatening deterioration.  Critical care  was time spent personally by me on the following activities: development of treatment plan with patient and/or surrogate as well as nursing, discussions with consultants, evaluation of patient's response to treatment, examination of patient, obtaining history from patient or surrogate, ordering and performing treatments and interventions, ordering and review of laboratory studies, ordering and review of radiographic studies, pulse oximetry and re-evaluation of patient's condition.   Disposition/Need for in-Hospital Stay- patient unable to be discharged at this time due to --Morganella morganii septic shock requiring IV antibiotics.  Currently off pressors; but is still not completely out of the woods.  Status post evaluation by speech therapy with recommendation for dysphagia 3 and nectar thick liquids currently.  Still slightly guarded currently regarding auscultation in his upper airways.  Status is: Inpatient   Disposition: The patient is from: Home              Anticipated d/c is to:  TBD              Anticipated d/c date is: > 3 days              Patient currently is not medically stable to d/c.  Barriers: Not Clinically Stable-   Code Status :  -  Code Status: DNR   Family Communication:   No family at bedside at time of my evaluation.  DVT Prophylaxis  :   - SCDs  enoxaparin (LOVENOX) injection 40 mg Start: 06/16/22 2200 SCDs Start: 06/16/22 0624   Lab Results  Component Value Date   PLT 115 (L) 06/18/2022    Inpatient Medications  Scheduled Meds:  atorvastatin  40 mg Oral Daily   Chlorhexidine Gluconate Cloth  6 each Topical Q0600   enoxaparin (LOVENOX) injection  40 mg Subcutaneous Q24H   finasteride  5 mg Oral Daily   insulin aspart  0-5 Units Subcutaneous QHS   insulin aspart  0-6 Units Subcutaneous TID WC   pantoprazole  40 mg Oral Daily   Continuous Infusions:  sodium chloride Stopped (06/16/22 0730)   sodium chloride 100 mL/hr at 06/20/22 1249   meropenem  (MERREM) IV     PRN Meds:.acetaminophen **OR** acetaminophen   Anti-infectives (From admission, onward)    Start     Dose/Rate Route Frequency Ordered Stop   06/20/22 1800  meropenem (MERREM) 1 g in sodium chloride 0.9 % 100 mL IVPB        1 g 200 mL/hr over 30 Minutes Intravenous Every 8 hours 06/20/22 1109     06/18/22 1000  meropenem (MERREM) 1 g in sodium chloride 0.9 % 100 mL IVPB  Status:  Discontinued        1 g 200 mL/hr over 30 Minutes Intravenous Every 8 hours 06/18/22 0855 06/20/22 1109   06/17/22 1530  meropenem (MERREM) 1 g in sodium chloride 0.9 % 100 mL IVPB  Status:  Discontinued        1 g 200 mL/hr over 30 Minutes Intravenous Every 12 hours 06/17/22 1431 06/18/22 0855   06/16/22 1615  ceFEPIme (MAXIPIME) 2 g in sodium chloride 0.9 % 100 mL IVPB  Status:  Discontinued        2 g 200 mL/hr over 30 Minutes Intravenous Every 12 hours 06/16/22 0400 06/16/22 0441   06/16/22 1600  ceFEPIme (MAXIPIME) 2 g in sodium chloride 0.9 % 100 mL IVPB  Status:  Discontinued        2 g 200 mL/hr over 30 Minutes Intravenous  Every 12 hours 06/16/22 0441 06/17/22 1431   06/16/22 1000  fluconazole (DIFLUCAN) IVPB 200 mg  Status:  Discontinued        200 mg 100 mL/hr over 60 Minutes Intravenous Every 24 hours 06/16/22 0836 06/20/22 1157   06/16/22 0400  ceFEPIme (MAXIPIME) 2 g in sodium chloride 0.9 % 100 mL IVPB        2 g 200 mL/hr over 30 Minutes Intravenous  Once 06/16/22 0348 06/16/22 0451         Subjective: [name] good saturation on 2 L nasal cannula; no chest pain, no nausea, no vomiting and demonstrating improvement in mentation.  Overall more interactive and following commands appropriately. Afebrile.   Objective: Vitals:   06/20/22 0900 06/20/22 1000 06/20/22 1100 06/20/22 1200  BP: (!) 145/49 (!) 136/44 (!) 148/58 (!) 141/44  Pulse: 75 69 62 64  Resp: (!) 21 (!) _0 Temp:      TempSrc:      SpO2: 100% 100% 100% 96%  Weight:      Height:         Intake/Output Summary (Last 24 hours) at 06/20/2022 1316 Last data filed at 06/20/2022 1249 Gross per 24 hour  Intake 3658.92 ml  Output 445 ml  Net 3213.92 ml   Filed Weights   06/18/22 0500 06/19/22 0500 06/20/22 0440  Weight: 87.4 kg 89.7 kg 90 kg    Physical Exam General exam: Alert, awake, oriented x 2, more interactive today and demonstrating less delay while answering questions. Respiratory system: positive rhonchi appreciated, no using accessory muscles, 2L [place] in place with good saturation.  Cardiovascular system:RRR. No rubs or Gallops. No JVD. Gastrointestinal system: Abdomen is nondistended, soft and nontender. No organomegaly or masses felt. Normal bowel sounds heard. Central nervous system: Alert and oriented. No focal neurological deficits. Extremities: No cyanosis, no clubbing.  Skin: No petechiae.  Psychiatry: Mood & affect appropriate.    Data Reviewed: I have personally reviewed following labs and imaging studies  CBC: Recent Labs  Lab 06/15/22 2213 06/17/22 0338 06/18/22 0359  WBC 12.6* 39.7* 27.6*  HGB 10.5* 8.7* 8.5*  HCT 33.3* 27.8* 27.1*  MCV 90.5 92.1 91.9  PLT 238 158 196*   Basic Metabolic Panel: Recent Labs  Lab 06/15/22 2213 06/16/22 0401 06/17/22 0338 06/18/22 0359  NA 137  --  136 138  K 3.8  --  3.9 3.2*  CL 106  --  109 111  CO2 24  --  21* 23  GLUCOSE 139*  --  126* 76  BUN 27*  --  32* 34*  CREATININE 1.15  --  1.24 0.87  CALCIUM 9.1  --  8.1* 7.8*  MG  --  1.9  --   --   PHOS  --  3.0  --   --    GFR: Estimated Creatinine Clearance: 63.8 mL/min (by C-G formula based on SCr of 0.87 mg/dL).  Liver Function Tests: Recent Labs  Lab 06/16/22 0401 06/17/22 0338 06/18/22 0359  AST 15 33 26  ALT _1 ALKPHOS 69 47 58  BILITOT 1.0 0.6 0.6  PROT 5.9* 5.0* 5.0*  ALBUMIN 2.6* 2.1* 2.0*   Sepsis Labs:  Recent Results (from the past 240 hour(s))  Urine Culture     Status: Abnormal   Collection Time: 06/15/22  9:37  PM   Specimen: In/Out Cath Urine  Result Value Ref Range Status   Specimen Description   Final  IN/OUT CATH URINE Performed at [facility], [address], [place], [place] [address]    Special Requests   Final    NONE Performed at [facility], [address], [place], [place] [address]    Culture MULTIPLE SPECIES PRESENT, SUGGEST RECOLLECTION (A)  Final   Report Status 06/17/2022 FINAL  Final  Blood Culture (routine x 2)     Status: Abnormal   Collection Time: 06/16/22  3:38 AM   Specimen: BLOOD RIGHT ARM  Result Value Ref Range Status   Specimen Description   Final    BLOOD RIGHT ARM Performed at [facility], [address], [place], [place] [address]    Special Requests   Final    BOTTLES DRAWN AEROBIC AND ANAEROBIC Blood Culture adequate volume Performed at [address] [place], [place] [address]    Culture  Setup Time   Final    GRAM NEGATIVE RODS BOTTLES DRAWN AEROBIC AND ANAEROBIC Gram Stain Report Called to,Read Back By and Verified With: Quentin Angst RN [phone] [place] CRITICAL RESULT CALLED TO, READ BACK BY AND VERIFIED WITH: RN ROBERT MAYNARD 06/17/22_0 :26 BY TW Performed at [facility], [place] [address], [place], [place] [address]    Culture [facility] MORGANII (A)  Final   Report Status 06/18/2022 FINAL  Final   Organism ID, Bacteria MORGANELLA MORGANII  Final      Susceptibility   Morganella morganii - MIC*    AMPICILLIN >=32 RESISTANT Resistant     CEFAZOLIN >=64 RESISTANT Resistant     CEFTAZIDIME 16 INTERMEDIATE Intermediate     CIPROFLOXACIN <=0.25 SENSITIVE Sensitive     GENTAMICIN <=1 SENSITIVE Sensitive     IMIPENEM 4 SENSITIVE Sensitive     TRIMETH/SULFA <=20 SENSITIVE Sensitive     AMPICILLIN/SULBACTAM >=32 RESISTANT Resistant     PIP/TAZO <=4 SENSITIVE Sensitive     * MORGANELLA MORGANII  Blood Culture ID Panel (Reflexed)     Status: Abnormal   Collection Time: 06/16/22  3:38 AM  Result Value Ref Range Status    Enterococcus faecalis NOT DETECTED NOT DETECTED Final   Enterococcus Faecium NOT DETECTED NOT DETECTED Final   Listeria monocytogenes NOT DETECTED NOT DETECTED Final   Staphylococcus species NOT DETECTED NOT DETECTED Final   Staphylococcus aureus (BCID) NOT DETECTED NOT DETECTED Final   Staphylococcus epidermidis NOT DETECTED NOT DETECTED Final   Staphylococcus lugdunensis NOT DETECTED NOT DETECTED Final   Streptococcus species NOT DETECTED NOT DETECTED Final   Streptococcus agalactiae NOT DETECTED NOT DETECTED Final   Streptococcus pneumoniae NOT DETECTED NOT DETECTED Final   Streptococcus pyogenes NOT DETECTED NOT DETECTED Final   A.calcoaceticus-baumannii NOT DETECTED NOT DETECTED Final   Bacteroides fragilis NOT DETECTED NOT DETECTED Final   Enterobacterales DETECTED (A) NOT DETECTED Final    Comment: Enterobacterales represent a large order of gram negative bacteria, not a single organism. Refer to culture for further identification. CRITICAL RESULT CALLED TO, READ BACK BY AND VERIFIED WITH: RN ROBERT MAYNARD 06/17/22_1 :26 BY TW    Enterobacter cloacae complex NOT DETECTED NOT DETECTED Final   Escherichia coli NOT DETECTED NOT DETECTED Final   Klebsiella aerogenes NOT DETECTED NOT DETECTED Final   Klebsiella oxytoca NOT DETECTED NOT DETECTED Final   Klebsiella pneumoniae NOT DETECTED NOT DETECTED Final   Proteus species NOT DETECTED NOT DETECTED Final   Salmonella species NOT DETECTED NOT DETECTED Final   Serratia marcescens NOT DETECTED NOT DETECTED Final   Haemophilus influenzae NOT DETECTED NOT DETECTED  Final   Neisseria meningitidis NOT DETECTED NOT DETECTED Final   Pseudomonas aeruginosa NOT DETECTED NOT DETECTED Final   Stenotrophomonas maltophilia NOT DETECTED NOT DETECTED Final   Candida albicans NOT DETECTED NOT DETECTED Final   Candida auris NOT DETECTED NOT DETECTED Final   Candida glabrata NOT DETECTED NOT DETECTED Final   Candida krusei NOT DETECTED NOT DETECTED  Final   Candida parapsilosis NOT DETECTED NOT DETECTED Final   Candida tropicalis NOT DETECTED NOT DETECTED Final   Cryptococcus neoformans/gattii NOT DETECTED NOT DETECTED Final   CTX-M ESBL NOT DETECTED NOT DETECTED Final   Carbapenem resistance IMP NOT DETECTED NOT DETECTED Final   Carbapenem resistance KPC NOT DETECTED NOT DETECTED Final   Carbapenem resistance NDM NOT DETECTED NOT DETECTED Final   Carbapenem resist OXA 48 LIKE NOT DETECTED NOT DETECTED Final   Carbapenem resistance VIM NOT DETECTED NOT DETECTED Final    Comment: Performed at [facility], [address] [place], [place] [address]  Resp Panel by RT-PCR (Flu A&B, Covid) Anterior Nasal Swab     Status: None   Collection Time: 06/16/22  3:48 AM   Specimen: Anterior Nasal Swab  Result Value Ref Range Status   SARS Coronavirus 2 by RT PCR NEGATIVE NEGATIVE Final    Comment: (NOTE) SARS-CoV-2 target nucleic acids are NOT DETECTED.  The SARS-CoV-2 RNA is generally detectable in upper respiratory specimens during the acute phase of infection. The lowest concentration of SARS-CoV-2 viral copies this assay can detect is 138 copies/mL. A negative result does not preclude SARS-Cov-2 infection and should not be used as the sole basis for treatment or other patient management decisions. A negative result may occur with  improper specimen collection/handling, submission of specimen other than nasopharyngeal swab, presence of viral mutation(s) within the areas targeted by this assay, and inadequate number of viral copies(<138 copies/mL). A negative result must be combined with clinical observations, patient history, and epidemiological information. The expected result is Negative.  Fact Sheet for Patients:  EntrepreneurPulse.com.au  Fact Sheet for Healthcare Providers:  IncredibleEmployment.be  This test is no t yet approved or cleared by the [place] FDA and  has been  authorized for detection and/or diagnosis of SARS-CoV-2 by FDA under an Emergency Use Authorization (EUA). This EUA will remain  in effect (meaning this test can be used) for the duration of the COVID-19 declaration under Section 564(b)(1) of the Act, 21 U.S.C.section 360bbb-3(b)(1), unless the authorization is terminated  or revoked sooner.       Influenza A by PCR NEGATIVE NEGATIVE Final   Influenza B by PCR NEGATIVE NEGATIVE Final    Comment: (NOTE) The Xpert Xpress SARS-CoV-2/FLU/RSV plus assay is intended as an aid in the diagnosis of influenza from Nasopharyngeal swab specimens and should not be used as a sole basis for treatment. Nasal washings and aspirates are unacceptable for Xpert Xpress SARS-CoV-2/FLU/RSV testing.  Fact Sheet for Patients: EntrepreneurPulse.com.au  Fact Sheet for Healthcare Providers: IncredibleEmployment.be  This test is not yet approved or cleared by the [place] FDA and has been authorized for detection and/or diagnosis of SARS-CoV-2 by FDA under an Emergency Use Authorization (EUA). This EUA will remain in effect (meaning this test can be used) for the duration of the COVID-19 declaration under Section 564(b)(1) of the Act, 21 U.S.C. section 360bbb-3(b)(1), unless the authorization is terminated or revoked.  Performed at [facility], [address] [place], [place] [address]   Blood Culture (routine x 2)  Status: Abnormal   Collection Time: 06/16/22  4:07 AM   Specimen: BLOOD RIGHT HAND  Result Value Ref Range Status   Specimen Description   Final    BLOOD RIGHT HAND Performed at [facility], [address], [place], [place] [address]    Special Requests   Final    BOTTLES DRAWN AEROBIC AND ANAEROBIC Blood Culture adequate volume Performed at [facility], [address], [place], [place] [address]    Culture  Setup Time   Final    GRAM NEGATIVE RODS BOTTLES DRAWN AEROBIC AND ANAEROBIC CRITICAL  VALUE NOTED.  VALUE IS CONSISTENT WITH PREVIOUSLY REPORTED AND CALLED VALUE. Performed at [facility], [address], [place], [place] [address]    Culture (A)  Final    MORGANELLA MORGANII SUSCEPTIBILITIES PERFORMED ON PREVIOUS CULTURE WITHIN THE LAST 5 DAYS. Performed at [facility], [place] [address] [place], [place] [address]    Report Status 06/18/2022 FINAL  Final  MRSA Next Gen by PCR, Nasal     Status: None   Collection Time: 06/16/22  6:04 AM   Specimen: Nasal Mucosa; Nasal Swab  Result Value Ref Range Status   MRSA by PCR Next Gen NOT DETECTED NOT DETECTED Final    Comment: (NOTE) The GeneXpert MRSA Assay (FDA approved for NASAL specimens only), is one component of a comprehensive MRSA colonization surveillance program. It is not intended to diagnose MRSA infection nor to guide or monitor treatment for MRSA infections. Test performance is not FDA approved in patients less than 68 years old. Performed at [facility], [address] [place], [place] [address]       Radiology Studies: No results found.   Scheduled Meds:  atorvastatin  40 mg Oral Daily   Chlorhexidine Gluconate Cloth  6 each Topical Q0600   enoxaparin (LOVENOX) injection  40 mg Subcutaneous Q24H   finasteride  5 mg Oral Daily   insulin aspart  0-5 Units Subcutaneous QHS   insulin aspart  0-6 Units Subcutaneous TID WC   pantoprazole  40 mg Oral Daily   Continuous Infusions:  sodium chloride Stopped (06/16/22 0730)   sodium chloride 100 mL/hr at 06/20/22 1249   meropenem (MERREM) IV       LOS: 4 days    Barton Dubois M.D on 06/20/2022 at 1:16 PM  Go to [email or web address] - for contact info  [facility] - Office  [phone]  If 7PM-7AM, please contact night-coverage [email or web address] 06/20/2022, 1:16 PM

## 2022-06-20 NOTE — Progress Notes (Signed)
Speech Language Pathology Treatment: Dysphagia  Patient Details Name: Jared Bridges MRN: [id number] DOB: [birth year] Today's Date: 06/20/2022 Time: 5638-9373 SLP Time Calculation (min) (ACUTE ONLY): 18 min  Assessment / Plan / Recommendation Clinical Impression  Ongoing diagnostic dysphagia treatment provided; continue to note improved alertness and responsiveness. Pt had just completed breakfast consuming mech soft/D3 tray without incident. As noted yesterday, Pt presents with some wet vocal quality and congested sounding cough at baseline prior to thin trials. SLP provided thin trials via cup and note no initial/immediate coughing. With the 3 oz water challenge further note no coughing, however, Pt continues to present with a delayed congested cough. This delayed congested cough is present across consistencies. Note when thin liquids are presented via straw cough is more immediate. Recommend continue with D3/mech soft and upgrade to THIN liquids with NO STRAWS. Continue to administer meds whole with puree. Consider completing MBS secondary to delayed congested cough if persistent and if indicated. ST will continue to follow. Thank you,   HPI HPI: Jared Bridges is a 88 y.o. male with medical history significant of hypertension, hyperlipidemia, T2DM, prostate cancer with osseous metastasis, BPH, hearing impairment, diabetic polyneuropathy who presents to the emergency department via EMS from home due to confusion and weakness.  Patient was unable to provide history, history was obtained from ED physician and ED medical record.  Per report, daughter activated EMS stating that patient has progressively become weaker and has been retaining urine. Chest x-ray showed no acute chest findings.      SLP Plan  Continue with current plan of care      Recommendations for follow up therapy are one component of a multi-disciplinary discharge planning process, led by the attending physician.  Recommendations  may be updated based on patient status, additional functional criteria and insurance authorization.    Recommendations  Diet recommendations: Dysphagia 3 (mechanical soft);Thin liquid Liquids provided via: Cup;Straw Medication Administration: Whole meds with puree Supervision: Staff to assist with self feeding;Full supervision/cueing for compensatory strategies Compensations: Minimize environmental distractions;Slow rate;Small sips/bites;Multiple dry swallows after each bite/sip;Clear throat intermittently Postural Changes and/or Swallow Maneuvers: Seated upright 90 degrees;Upright 30-60 min after meal                Oral Care Recommendations: Oral care BID;Staff/trained caregiver to provide oral care Assistance recommended at discharge: Frequent or constant Supervision/Assistance SLP Visit Diagnosis: Dysphagia, unspecified (R13.10) Plan: Continue with current plan of care          Lameisha Schuenemann H. Roddie Mc, CCC-SLP Speech Language Pathologist  Wende Bushy  06/20/2022, 10:51 AM

## 2022-06-20 NOTE — Progress Notes (Signed)
Central line removed, pressure dressing applied. WNL.

## 2022-06-20 NOTE — Progress Notes (Signed)
Pharmacy Antibiotic Note  Jared Bridges is a 88 y.o. male admitted on 06/15/2022 with bacteremia  Pharmacy has been consulted for merrem dosing. Patient improving. Renal function improved. Morganella morganii in [place]. AF, BC improving.   Plan: Increase Meropenem 1000 mg IV every 8 hours. Monitor clinical progress, cultures/sensitivities, renal function  Height: '6\' 1"'$  (185.4 cm) Weight: 90 kg (198 lb 6.6 oz) IBW/kg (Calculated) : 79.9  Temp (24hrs), Avg:98 F (36.7 C), Min:97.6 F (36.4 C), Max:98.4 F (36.9 C)  Recent Labs  Lab 06/15/22 2213 06/16/22 0401 06/16/22 0529 06/16/22 0752 06/16/22 1116 06/17/22 0338 06/17/22 0829 06/18/22 0359  WBC 12.6*  --   --   --   --  39.7*  --  27.6*  CREATININE 1.15  --   --   --   --  1.24  --  0.87  LATICACIDVEN  --  5.1* 4.7* 5.4* 5.4*  --  2.0*  --      Estimated Creatinine Clearance: 63.8 mL/min (by C-G formula based on SCr of 0.87 mg/dL).    Allergies  Allergen Reactions   Iodine Hives, Itching and Other (See Comments)    very allergic(per daughter), can pre-med with benadryl   Calan [Verapamil] Other (See Comments)    Weakness   Cymbalta [Duloxetine Hcl] Other (See Comments)    Makes patient have jerking motions.   Iodinated Contrast Media Hives, Itching and Other (See Comments)    Can pre-med with benadryl   Latex Other (See Comments)    Redness iritation   Lisinopril Other (See Comments)    Makes patient have jerking motions.   Neurontin [Gabapentin] Other (See Comments)    Makes patient have jerking motions.   Zoloft [Sertraline Hcl] Diarrhea and Other (See Comments)    Makes patient have jerking motions.   Valium [Diazepam] Other (See Comments)    "Did not work"   Flomax [Tamsulosin Hcl] Other (See Comments)    Dizziness   Glimepiride Other (See Comments)    "Did not work"   Melatonin Nausea Only   Tape Other (See Comments)    Skin irritation    Antimicrobials this admission: Merrem 7/18 >> Cefepime 7/17  >> 7/18   Microbiology results: 7/17 BCx: morganella morganii S- cipro, imipenem, septra  Intermediate to ceftazidime R- unasyn , cefazolin 7/16 UCx: multiple species 7/17 MRSA PCR: neg  Isac Sarna, BS Pharm D, BCPS Clinical Pharmacist 06/20/2022 11:01 AM

## 2022-06-20 NOTE — Evaluation (Signed)
Physical Therapy Evaluation Patient Details Name: Jared Bridges MRN: [id number] DOB: [birth year] Today's Date: 06/20/2022  History of Present Illness  Jared Bridges is a 88 y.o. male with medical history significant of hypertension, hyperlipidemia, T2DM, prostate cancer with osseous metastasis, BPH, hearing impairment, diabetic polyneuropathy who presents to the emergency department via EMS from home due to confusion and weakness.  Patient was unable to provide history, history was obtained from ED physician and ED medical record.  Per report, daughter activated EMS stating that patient has progressively become weaker and has been retaining urine.   Clinical Impression  Patient presents confused and poor historian for answering questions.  Patient demonstrates slow labored movement for sitting up at bedside requiring Mod/max assist, Max assist to stand and unable to fully extend trunk due to weakness.  Patient to weak to transfer to chair and required 2 person Max assist to reposition when put back to bed.  Patient will benefit from continued skilled physical therapy in hospital and recommended venue below to increase strength, balance, endurance for safe ADLs and gait.         Recommendations for follow up therapy are one component of a multi-disciplinary discharge planning process, led by the attending physician.  Recommendations may be updated based on patient status, additional functional criteria and insurance authorization.  Follow Up Recommendations Skilled nursing-short term rehab (<3 hours/day) Can patient physically be transported by private vehicle: No    Assistance Recommended at Discharge Intermittent Supervision/Assistance  Patient can return home with the following  A lot of help with bathing/dressing/bathroom;A lot of help with walking and/or transfers;Help with stairs or ramp for entrance;Assistance with cooking/housework    Equipment Recommendations None recommended by PT   Recommendations for Other Services       Functional Status Assessment Patient has had a recent decline in their functional status and demonstrates the ability to make significant improvements in function in a reasonable and predictable amount of time.     Precautions / Restrictions Precautions Precautions: Fall Restrictions Weight Bearing Restrictions: No      Mobility  Bed Mobility Overal bed mobility: Needs Assistance Bed Mobility: Supine to Sit, Sit to Supine     Supine to sit: Mod assist, Max assist Sit to supine: Mod assist, Max assist   General bed mobility comments: slow labored movement    Transfers Overall transfer level: Needs assistance Equipment used: Rolling walker (2 wheels) Transfers: Sit to/from Stand Sit to Stand: Max assist                Ambulation/Gait                  Stairs            Wheelchair Mobility    Modified Rankin (Stroke Patients Only)       Balance Overall balance assessment: Needs assistance Sitting-balance support: Feet supported, No upper extremity supported Sitting balance-Leahy Scale: Fair Sitting balance - Comments: seated at EOB   Standing balance support: During functional activity, Reliant on assistive device for balance Standing balance-Leahy Scale: Poor Standing balance comment: using RW                             Pertinent Vitals/Pain Pain Assessment Pain Assessment: No/denies pain    Home Living Family/patient expects to be discharged to:: Private residence Living Arrangements: Spouse/significant other;Children Available Help at Discharge: Family;Available 24 hours/day Type of Home: House Home Access:  Stairs to enter Entrance Stairs-Rails: Can reach both Entrance Stairs-Number of Steps: 5 Alternate Level Stairs-Number of Steps: 17 Home Layout: Two level;Bed/bath upstairs Home Equipment: Rollator (4 wheels);Rolling Walker (2 wheels);Shower seat Additional Comments:  Patient is poor historian, info taken from prior admission    Prior Function Prior Level of Function : Needs assist       Physical Assist : Mobility (physical);ADLs (physical) Mobility (physical): Bed mobility;Transfers;Gait;Stairs   Mobility Comments: "patient is poor historian" ADLs Comments: assisted by family     Hand Dominance   Dominant Hand: Right    Extremity/Trunk Assessment   Upper Extremity Assessment Upper Extremity Assessment: Generalized weakness    Lower Extremity Assessment Lower Extremity Assessment: Generalized weakness    Cervical / Trunk Assessment Cervical / Trunk Assessment: Kyphotic  Communication   Communication: HOH  Cognition Arousal/Alertness: Awake/alert Behavior During Therapy: WFL for tasks assessed/performed Overall Cognitive Status: No family/caregiver present to determine baseline cognitive functioning                                          General Comments      Exercises     Assessment/Plan    PT Assessment Patient needs continued PT services  PT Problem List Decreased strength;Decreased activity tolerance;Decreased balance;Decreased mobility       PT Treatment Interventions DME instruction;Gait training;Stair training;Functional mobility training;Therapeutic activities;Therapeutic exercise;Balance training;Patient/family education    PT Goals (Current goals can be found in the Care Plan section)  Acute Rehab PT Goals Patient Stated Goal: not stated PT Goal Formulation: With patient Time For Goal Achievement: 07/04/22 Potential to Achieve Goals: Good    Frequency Min 3X/week     Co-evaluation               AM-PAC PT "6 Clicks" Mobility  Outcome Measure Help needed turning from your back to your side while in a flat bed without using bedrails?: A Lot Help needed moving from lying on your back to sitting on the side of a flat bed without using bedrails?: A Lot Help needed moving to and from a  bed to a chair (including a wheelchair)?: Total Help needed standing up from a chair using your arms (e.g., wheelchair or bedside chair)?: A Lot Help needed to walk in hospital room?: Total Help needed climbing 3-5 steps with a railing? : Total 6 Click Score: 9    End of Session   Activity Tolerance: Patient tolerated treatment well;Patient limited by fatigue Patient left: in bed;with call bell/phone within reach Nurse Communication: Mobility status PT Visit Diagnosis: Unsteadiness on feet (R26.81);Other abnormalities of gait and mobility (R26.89);Muscle weakness (generalized) (M62.81)    Time: 8469-6295 PT Time Calculation (min) (ACUTE ONLY): 21 min   Charges:   PT Evaluation $PT Eval Moderate Complexity: 1 Mod PT Treatments $Therapeutic Activity: 8-22 mins        3:48 PM, 06/20/22 Lonell Grandchild, MPT Physical Therapist with [facility] [phone] office [phone] mobile phone

## 2022-06-21 DIAGNOSIS — A419 Sepsis, unspecified organism: Secondary | ICD-10-CM | POA: Diagnosis not present

## 2022-06-21 DIAGNOSIS — R6521 Severe sepsis with septic shock: Secondary | ICD-10-CM | POA: Diagnosis not present

## 2022-06-21 DIAGNOSIS — Z515 Encounter for palliative care: Secondary | ICD-10-CM | POA: Diagnosis not present

## 2022-06-21 DIAGNOSIS — Z7189 Other specified counseling: Secondary | ICD-10-CM | POA: Diagnosis not present

## 2022-06-21 LAB — BASIC METABOLIC PANEL
Anion gap: 5 (ref 5–15)
BUN: 17 mg/dL (ref 8–23)
CO2: 22 mmol/L (ref 22–32)
Calcium: 7.8 mg/dL — ABNORMAL LOW (ref 8.9–10.3)
Chloride: 115 mmol/L — ABNORMAL HIGH (ref 98–111)
Creatinine, Ser: 0.59 mg/dL — ABNORMAL LOW (ref 0.61–1.24)
GFR, Estimated: >60 mL/min (ref >60)
Glucose, Bld: 85 mg/dL (ref 70–99)
Potassium: 2.6 mmol/L — CL (ref 3.5–5.1)
Sodium: 142 mmol/L (ref 135–145)

## 2022-06-21 LAB — CBC
HCT: 29 % — ABNORMAL LOW (ref 39.0–52.0)
Hemoglobin: 8.9 g/dL — ABNORMAL LOW (ref 13.0–17.0)
MCH: 28.2 pg (ref 26.0–34.0)
MCHC: 30.7 g/dL (ref 30.0–36.0)
MCV: 91.8 fL (ref 80.0–100.0)
Platelets: 121 10*3/uL — ABNORMAL LOW (ref 150–400)
RBC: 3.16 MIL/uL — ABNORMAL LOW (ref 4.22–5.81)
RDW: 14.5 % (ref 11.5–15.5)
WBC: 7.7 10*3/uL (ref 4.0–10.5)
nRBC: 0 % (ref 0.0–0.2)

## 2022-06-21 LAB — GLUCOSE, CAPILLARY
Glucose-Capillary: 80 mg/dL (ref 70–99)
Glucose-Capillary: 114 mg/dL — ABNORMAL HIGH (ref 70–99)
Glucose-Capillary: 112 mg/dL — ABNORMAL HIGH (ref 70–99)
Glucose-Capillary: 97 mg/dL (ref 70–99)

## 2022-06-21 LAB — MAGNESIUM: Magnesium: 1.9 mg/dL (ref 1.7–2.4)

## 2022-06-21 MED ORDER — PANTOPRAZOLE 2 MG/ML SUSPENSION
40.0000 mg | Freq: Every day | ORAL | Status: DC
  Administered 2022-06-21 – 2022-06-24 (×4): 40 mg via ORAL
  Filled 2022-06-21 (×4): qty 20

## 2022-06-21 MED ORDER — POTASSIUM CHLORIDE CRYS ER 20 MEQ PO TBCR
40.0000 meq | EXTENDED_RELEASE_TABLET | ORAL | Status: AC
  Administered 2022-06-21 (×3): 40 meq via ORAL
  Filled 2022-06-21 (×3): qty 2

## 2022-06-21 NOTE — Progress Notes (Signed)
PROGRESS NOTE     Jared Bridges, is a 88 y.o. male, DOB - 04-25-1930, KXF:[id number]  Admit date - 06/15/2022   Admitting Physician Bernadette Hoit, DO  Outpatient Primary MD for the patient is [name], MD  LOS - 5  Chief Complaint  Patient presents with   Weakness        Brief Narrative:  Brief Summary:-  88 y.o. male with medical history significant of hypertension, hyperlipidemia, T2DM, prostate cancer with osseous metastasis, BPH, hearing impairment, diabetic polyneuropathy admitted on 06/16/2022 with septic shock due to Morganella morganii bacteremia presumably from urinary source    -Assessment and Plan:   A/p 1) Morganella Morgagni septic shock from presumed urinary source--POA -Patient met sepsis criteria on admission -Lactic acidosis and leukocytosis noted WBC 12.6 >>>39.7>> 27.6>>> 7.7 Lactic acid 5.4 >>2.0>>> within normal limits. -Required Levophed and vasopressin for pressure support as well as IV fluids until 06/18/2022; patient has remained off pressure support and demonstrating stable vital signs. -Continue treatment with meropenem; multidrug-resistant results appreciated by sensitivity.  Possible option for patient is the use of Bactrim when able to take by mouth at time of discharge. -Planning for evaluation by speech therapy with MBS on 06/23/2022   2) history of urinary retention/BPH with LUTS--- unable to void, given hemodynamic instability and the need to monitor urine output accurately Foley catheter/coud catheter placed on 06/16/2022 with 400 mL of urine removed -Now that his blood pressure has improved and is no longer requiring pressors Proscar will be resume. -Continue to closely follow urine output. -Will attempt voiding trial once medically stable; if patient failed voiding trial will discharge with catheter in place and outpatient follow-up with urology service.[name] Kitchen   3) social/ethics--- plan of care discussed with patient's daughter and  wife -Patient DNR/DNI CODE STATUS has been revoked after discussing with patient's daughter.  She would like to see how the patient's continue to to work/respond prior to make any final decisions.  She will continue talking with patient itself and the rest of the family to come up with a living will. -Palliative care consult appreciated, will follow further recommendations. -Overall prognosis is guarded -Patient is now full code.  4)HFpEF--- prior echo with EF of 65% with grade 1 diastolic dysfunction -Patient continues to require IV fluids at this time due to sepsis. -Continue to closely follow volume status as patient is at risk for fluid overload. -Continue to monitor volume status.  5) acute on chronic anemia--- patient with anemia of chronic disease. -Decrease in his hemoglobin due to hemodilution due to IV fluids for sepsis. -No overt bleeding appreciated -Continue to follow hemoglobin trend intermittently and transfuse as needed. -CBC demonstrating hemoglobin 8.9, WBCs 7.7.  Platelets count 121 K.  6)DM2--- prior A1c 6.4 reflecting good diabetic control PTA -Continue sliding scale insulin and close monitoring of patient's CBGs to further adjust management as needed.  7) dysphagia--- speech pathologist assistance and recommendations appreciated. -Planning for MBS once more alert and interactive; tentatively scheduled for 06/23/2022; will follow recommendations... -Patient's diet has been advanced to dysphagia 2 with nectar thick liquids based on bedside evaluation.  Status is: Inpatient   Disposition: The patient is from: Home              Anticipated d/c is to:  SNF for short-term rehab              Anticipated d/c date is: > 3 days  Patient currently reaching medical stability slowly; anticipate being ready in the next 48 hours.  Skilled nursing facility has been recommended by physical therapy  Barriers: Not Clinically Stable-   Code Status :  -  Code Status: Full  Code   Family Communication:   No family at bedside at time of my evaluation.  DVT Prophylaxis  :   - SCDs  enoxaparin (LOVENOX) injection 40 mg Start: 06/16/22 2200 SCDs Start: 06/16/22 0624   Lab Results  Component Value Date   PLT 121 (L) 06/21/2022    Inpatient Medications  Scheduled Meds:  atorvastatin  40 mg Oral Daily   Chlorhexidine Gluconate Cloth  6 each Topical Q0600   enoxaparin (LOVENOX) injection  40 mg Subcutaneous Q24H   finasteride  5 mg Oral Daily   insulin aspart  0-5 Units Subcutaneous QHS   insulin aspart  0-6 Units Subcutaneous TID WC   pantoprazole sodium  40 mg Oral Daily   Continuous Infusions:  sodium chloride Stopped (06/16/22 0730)   sodium chloride 75 mL/hr at 06/21/22 0431   meropenem (MERREM) IV 1 g (06/21/22 1005)   PRN Meds:.acetaminophen **OR** acetaminophen   Anti-infectives (From admission, onward)    Start     Dose/Rate Route Frequency Ordered Stop   06/20/22 1800  meropenem (MERREM) 1 g in sodium chloride 0.9 % 100 mL IVPB        1 g 200 mL/hr over 30 Minutes Intravenous Every 8 hours 06/20/22 1109     06/18/22 1000  meropenem (MERREM) 1 g in sodium chloride 0.9 % 100 mL IVPB  Status:  Discontinued        1 g 200 mL/hr over 30 Minutes Intravenous Every 8 hours 06/18/22 0855 06/20/22 1109   06/17/22 1530  meropenem (MERREM) 1 g in sodium chloride 0.9 % 100 mL IVPB  Status:  Discontinued        1 g 200 mL/hr over 30 Minutes Intravenous Every 12 hours 06/17/22 1431 06/18/22 0855   06/16/22 1615  ceFEPIme (MAXIPIME) 2 g in sodium chloride 0.9 % 100 mL IVPB  Status:  Discontinued        2 g 200 mL/hr over 30 Minutes Intravenous Every 12 hours 06/16/22 0400 06/16/22 0441   06/16/22 1600  ceFEPIme (MAXIPIME) 2 g in sodium chloride 0.9 % 100 mL IVPB  Status:  Discontinued        2 g 200 mL/hr over 30 Minutes Intravenous Every 12 hours 06/16/22 0441 06/17/22 1431   06/16/22 1000  fluconazole (DIFLUCAN) IVPB 200 mg  Status:  Discontinued         200 mg 100 mL/hr over 60 Minutes Intravenous Every 24 hours 06/16/22 0836 06/20/22 1157   06/16/22 0400  ceFEPIme (MAXIPIME) 2 g in sodium chloride 0.9 % 100 mL IVPB        2 g 200 mL/hr over 30 Minutes Intravenous  Once 06/16/22 0348 06/16/22 0451         Subjective: [name] feeling much better and on examination more interactive.  Currently off oxygen supplementation demonstrating good saturation on room air.  Patient reports no chest pain, shortness of breath, nausea, vomiting, palpitations or fever.  Still with appreciated increase upper airway secretion sounds and generalized weakness/deconditioning.  Objective: Vitals:   06/21/22 0131 06/21/22 0444 06/21/22 0500 06/21/22 1344  BP: (!) 135/59 (!) 143/61  (!) 143/60  Pulse: (!) 55 60  (!) 52  Resp: 15   (!) 24  Temp: 98 F (  36.7 C)   97.8 F (36.6 C)  TempSrc:    Oral  SpO2: 97% 96%  98%  Weight:   93.9 kg   Height:        Intake/Output Summary (Last 24 hours) at 06/21/2022 1734 Last data filed at 06/21/2022 1404 Gross per 24 hour  Intake 1613.28 ml  Output 2500 ml  Net -886.72 ml   Filed Weights   06/19/22 0500 06/20/22 0440 06/21/22 0500  Weight: 89.7 kg 90 kg 93.9 kg    Physical Exam General exam: Alert, awake, oriented x 3; following commands appropriately.  Currently not requiring oxygen supplementation reporting feeling good.  Increased upper airway secretions appreciated.  Some intermittent coughing spells reported. Respiratory system: Positive rhonchi, no wheezing, no using accessory muscle.  Good saturation on room air. Cardiovascular system: Rate controlled, no rubs, no gallops, no JVD. Gastrointestinal system: Abdomen is nondistended, soft and nontender. No organomegaly or masses felt. Normal bowel sounds heard.  Foley catheter in place. Central nervous system: Alert and oriented. No new focal neurological deficits. Extremities: No cyanosis or clubbing. Skin: No petechiae. Psychiatry: Mood &  affect appropriate.    Data Reviewed: I have personally reviewed following labs and imaging studies  CBC: Recent Labs  Lab 06/15/22 2213 06/17/22 0338 06/18/22 0359 06/21/22 0545  WBC 12.6* 39.7* 27.6* 7.7  HGB 10.5* 8.7* 8.5* 8.9*  HCT 33.3* 27.8* 27.1* 29.0*  MCV 90.5 92.1 91.9 91.8  PLT 238 158 115* 818*   Basic Metabolic Panel: Recent Labs  Lab 06/15/22 2213 06/16/22 0401 06/17/22 0338 06/18/22 0359 06/21/22 0545  NA 137  --  136 138 142  K 3.8  --  3.9 3.2* 2.6*  CL 106  --  109 111 115*  CO2 24  --  21* 23 22  GLUCOSE 139*  --  126* 76 85  BUN 27*  --  32* 34* 17  CREATININE 1.15  --  1.24 0.87 0.59*  CALCIUM 9.1  --  8.1* 7.8* 7.8*  MG  --  1.9  --   --  1.9  PHOS  --  3.0  --   --   --    GFR: Estimated Creatinine Clearance: 69.4 mL/min (A) (by C-G formula based on SCr of 0.59 mg/dL (L)).  Liver Function Tests: Recent Labs  Lab 06/16/22 0401 06/17/22 0338 06/18/22 0359  AST 15 33 26  ALT '9 16 14  ' ALKPHOS 69 47 58  BILITOT 1.0 0.6 0.6  PROT 5.9* 5.0* 5.0*  ALBUMIN 2.6* 2.1* 2.0*   Sepsis Labs:  Recent Results (from the past 240 hour(s))  Urine Culture     Status: Abnormal   Collection Time: 06/15/22  9:37 PM   Specimen: In/Out Cath Urine  Result Value Ref Range Status   Specimen Description   Final    IN/OUT CATH URINE Performed at [facility], [address], [place], [place] [address]    Special Requests   Final    NONE Performed at [facility], [address], [place], [place] [address]    Culture MULTIPLE SPECIES PRESENT, SUGGEST RECOLLECTION (A)  Final   Report Status 06/17/2022 FINAL  Final  Blood Culture (routine x 2)     Status: Abnormal   Collection Time: 06/16/22  3:38 AM   Specimen: BLOOD RIGHT ARM  Result Value Ref Range Status   Specimen Description   Final    BLOOD RIGHT ARM Performed at [facility], [address] [place], [place] [address]  Special Requests   Final    BOTTLES DRAWN AEROBIC AND ANAEROBIC Blood  Culture adequate volume Performed at [facility], [address] [place], [place] [address]    Culture  Setup Time   Final    GRAM NEGATIVE RODS BOTTLES DRAWN AEROBIC AND ANAEROBIC Gram Stain Report Called to,Read Back By and Verified With: Quentin Angst RN [phone] K [place] CRITICAL RESULT CALLED TO, READ BACK BY AND VERIFIED WITH: RN ROBERT MAYNARD 06/17/22'@00' :26 BY TW Performed at [facility], [place] [address] [place], [place] [address]    Culture [facility] MORGANII (A)  Final   Report Status 06/18/2022 FINAL  Final   Organism ID, Bacteria MORGANELLA MORGANII  Final      Susceptibility   Morganella morganii - MIC*    AMPICILLIN >=32 RESISTANT Resistant     CEFAZOLIN >=64 RESISTANT Resistant     CEFTAZIDIME 16 INTERMEDIATE Intermediate     CIPROFLOXACIN <=0.25 SENSITIVE Sensitive     GENTAMICIN <=1 SENSITIVE Sensitive     IMIPENEM 4 SENSITIVE Sensitive     TRIMETH/SULFA <=20 SENSITIVE Sensitive     AMPICILLIN/SULBACTAM >=32 RESISTANT Resistant     PIP/TAZO <=4 SENSITIVE Sensitive     * MORGANELLA MORGANII  Blood Culture ID Panel (Reflexed)     Status: Abnormal   Collection Time: 06/16/22  3:38 AM  Result Value Ref Range Status   Enterococcus faecalis NOT DETECTED NOT DETECTED Final   Enterococcus Faecium NOT DETECTED NOT DETECTED Final   Listeria monocytogenes NOT DETECTED NOT DETECTED Final   Staphylococcus species NOT DETECTED NOT DETECTED Final   Staphylococcus aureus (BCID) NOT DETECTED NOT DETECTED Final   Staphylococcus epidermidis NOT DETECTED NOT DETECTED Final   Staphylococcus lugdunensis NOT DETECTED NOT DETECTED Final   Streptococcus species NOT DETECTED NOT DETECTED Final   Streptococcus agalactiae NOT DETECTED NOT DETECTED Final   Streptococcus pneumoniae NOT DETECTED NOT DETECTED Final   Streptococcus pyogenes NOT DETECTED NOT DETECTED Final   A.calcoaceticus-baumannii NOT DETECTED NOT DETECTED Final   Bacteroides fragilis NOT DETECTED NOT DETECTED  Final   Enterobacterales DETECTED (A) NOT DETECTED Final    Comment: Enterobacterales represent a large order of gram negative bacteria, not a single organism. Refer to culture for further identification. CRITICAL RESULT CALLED TO, READ BACK BY AND VERIFIED WITH: RN ROBERT MAYNARD 06/17/22'@00' :26 BY TW    Enterobacter cloacae complex NOT DETECTED NOT DETECTED Final   Escherichia coli NOT DETECTED NOT DETECTED Final   Klebsiella aerogenes NOT DETECTED NOT DETECTED Final   Klebsiella oxytoca NOT DETECTED NOT DETECTED Final   Klebsiella pneumoniae NOT DETECTED NOT DETECTED Final   Proteus species NOT DETECTED NOT DETECTED Final   Salmonella species NOT DETECTED NOT DETECTED Final   Serratia marcescens NOT DETECTED NOT DETECTED Final   Haemophilus influenzae NOT DETECTED NOT DETECTED Final   Neisseria meningitidis NOT DETECTED NOT DETECTED Final   Pseudomonas aeruginosa NOT DETECTED NOT DETECTED Final   Stenotrophomonas maltophilia NOT DETECTED NOT DETECTED Final   Candida albicans NOT DETECTED NOT DETECTED Final   Candida auris NOT DETECTED NOT DETECTED Final   Candida glabrata NOT DETECTED NOT DETECTED Final   Candida krusei NOT DETECTED NOT DETECTED Final   Candida parapsilosis NOT DETECTED NOT DETECTED Final   Candida tropicalis NOT DETECTED NOT DETECTED Final   Cryptococcus neoformans/gattii NOT DETECTED NOT DETECTED Final   CTX-M ESBL NOT DETECTED NOT DETECTED Final   Carbapenem resistance IMP NOT DETECTED NOT DETECTED Final   Carbapenem resistance KPC  NOT DETECTED NOT DETECTED Final   Carbapenem resistance NDM NOT DETECTED NOT DETECTED Final   Carbapenem resist OXA 48 LIKE NOT DETECTED NOT DETECTED Final   Carbapenem resistance VIM NOT DETECTED NOT DETECTED Final    Comment: Performed at [facility], [place] [address] [place], [place] [address]  Resp Panel by RT-PCR (Flu A&B, Covid) Anterior Nasal Swab     Status: None   Collection Time: 06/16/22  3:48 AM   Specimen: Anterior  Nasal Swab  Result Value Ref Range Status   SARS Coronavirus 2 by RT PCR NEGATIVE NEGATIVE Final    Comment: (NOTE) SARS-CoV-2 target nucleic acids are NOT DETECTED.  The SARS-CoV-2 RNA is generally detectable in upper respiratory specimens during the acute phase of infection. The lowest concentration of SARS-CoV-2 viral copies this assay can detect is 138 copies/mL. A negative result does not preclude SARS-Cov-2 infection and should not be used as the sole basis for treatment or other patient management decisions. A negative result may occur with  improper specimen collection/handling, submission of specimen other than nasopharyngeal swab, presence of viral mutation(s) within the areas targeted by this assay, and inadequate number of viral copies(<138 copies/mL). A negative result must be combined with clinical observations, patient history, and epidemiological information. The expected result is Negative.  Fact Sheet for Patients:  EntrepreneurPulse.com.au  Fact Sheet for Healthcare Providers:  IncredibleEmployment.be  This test is no t yet approved or cleared by the [place] FDA and  has been authorized for detection and/or diagnosis of SARS-CoV-2 by FDA under an Emergency Use Authorization (EUA). This EUA will remain  in effect (meaning this test can be used) for the duration of the COVID-19 declaration under Section 564(b)(1) of the Act, 21 U.S.C.section 360bbb-3(b)(1), unless the authorization is terminated  or revoked sooner.       Influenza A by PCR NEGATIVE NEGATIVE Final   Influenza B by PCR NEGATIVE NEGATIVE Final    Comment: (NOTE) The Xpert Xpress SARS-CoV-2/FLU/RSV plus assay is intended as an aid in the diagnosis of influenza from Nasopharyngeal swab specimens and should not be used as a sole basis for treatment. Nasal washings and aspirates are unacceptable for Xpert Xpress SARS-CoV-2/FLU/RSV testing.  Fact Sheet for  Patients: EntrepreneurPulse.com.au  Fact Sheet for Healthcare Providers: IncredibleEmployment.be  This test is not yet approved or cleared by the [place] FDA and has been authorized for detection and/or diagnosis of SARS-CoV-2 by FDA under an Emergency Use Authorization (EUA). This EUA will remain in effect (meaning this test can be used) for the duration of the COVID-19 declaration under Section 564(b)(1) of the Act, 21 U.S.C. section 360bbb-3(b)(1), unless the authorization is terminated or revoked.  Performed at [facility], [address], [place], [place] [address]   Blood Culture (routine x 2)     Status: Abnormal   Collection Time: 06/16/22  4:07 AM   Specimen: BLOOD RIGHT HAND  Result Value Ref Range Status   Specimen Description   Final    BLOOD RIGHT HAND Performed at [facility], [address] [place], [place] [address]    Special Requests   Final    BOTTLES DRAWN AEROBIC AND ANAEROBIC Blood Culture adequate volume Performed at [facility], [name] [place], [place] [address]    Culture  Setup Time   Final    GRAM NEGATIVE RODS BOTTLES DRAWN AEROBIC AND ANAEROBIC CRITICAL VALUE NOTED.  VALUE IS CONSISTENT WITH PREVIOUSLY REPORTED AND CALLED VALUE. Performed at [facility], [address], [place], [place] [address]    Culture (A)  Final    MORGANELLA MORGANII SUSCEPTIBILITIES PERFORMED ON PREVIOUS CULTURE WITHIN THE LAST 5 DAYS. Performed at [facility], [place] [address], [place], [place] [address]    Report Status 06/18/2022 FINAL  Final  MRSA Next Gen by PCR, Nasal     Status: None   Collection Time: 06/16/22  6:04 AM   Specimen: Nasal Mucosa; Nasal Swab  Result Value Ref Range Status   MRSA by PCR Next Gen NOT DETECTED NOT DETECTED Final    Comment: (NOTE) The GeneXpert MRSA Assay (FDA approved for NASAL specimens only), is one component of a comprehensive MRSA colonization surveillance program. It is  not intended to diagnose MRSA infection nor to guide or monitor treatment for MRSA infections. Test performance is not FDA approved in patients less than 9 years old. Performed at [facility], [address], [place],  [address]      Radiology Studies: No results found.  Scheduled Meds:  atorvastatin  40 mg Oral Daily   Chlorhexidine Gluconate Cloth  6 each Topical Q0600   enoxaparin (LOVENOX) injection  40 mg Subcutaneous Q24H   finasteride  5 mg Oral Daily   insulin aspart  0-5 Units Subcutaneous QHS   insulin aspart  0-6 Units Subcutaneous TID WC   pantoprazole sodium  40 mg Oral Daily   Continuous Infusions:  sodium chloride Stopped (06/16/22 0730)   sodium chloride 75 mL/hr at 06/21/22 0431   meropenem (MERREM) IV 1 g (06/21/22 1005)     LOS: 5 days    Barton Dubois M.D on 06/21/2022 at 5:34 PM  Go to [email or web address] - for contact info  [facility] - Office  [phone]  If 7PM-7AM, please contact night-coverage [email or web address] 06/21/2022, 5:34 PM

## 2022-06-22 DIAGNOSIS — Z515 Encounter for palliative care: Secondary | ICD-10-CM | POA: Diagnosis not present

## 2022-06-22 DIAGNOSIS — R6521 Severe sepsis with septic shock: Secondary | ICD-10-CM | POA: Diagnosis not present

## 2022-06-22 DIAGNOSIS — Z7189 Other specified counseling: Secondary | ICD-10-CM | POA: Diagnosis not present

## 2022-06-22 DIAGNOSIS — A419 Sepsis, unspecified organism: Secondary | ICD-10-CM | POA: Diagnosis not present

## 2022-06-22 LAB — GLUCOSE, CAPILLARY
Glucose-Capillary: 104 mg/dL — ABNORMAL HIGH (ref 70–99)
Glucose-Capillary: 136 mg/dL — ABNORMAL HIGH (ref 70–99)
Glucose-Capillary: 142 mg/dL — ABNORMAL HIGH (ref 70–99)

## 2022-06-22 LAB — BASIC METABOLIC PANEL
Anion gap: 4 — ABNORMAL LOW (ref 5–15)
BUN: 8 mg/dL (ref 8–23)
CO2: 26 mmol/L (ref 22–32)
Calcium: 7.5 mg/dL — ABNORMAL LOW (ref 8.9–10.3)
Chloride: 107 mmol/L (ref 98–111)
Creatinine, Ser: 0.53 mg/dL — ABNORMAL LOW (ref 0.61–1.24)
GFR, Estimated: >60 mL/min (ref >60)
Glucose, Bld: 136 mg/dL — ABNORMAL HIGH (ref 70–99)
Potassium: 2.9 mmol/L — ABNORMAL LOW (ref 3.5–5.1)
Sodium: 137 mmol/L (ref 135–145)

## 2022-06-22 MED ORDER — POTASSIUM CHLORIDE 10 MEQ/100ML IV SOLN
10.0000 meq | INTRAVENOUS | Status: AC
  Administered 2022-06-22 (×2): 10 meq via INTRAVENOUS
  Filled 2022-06-22 (×2): qty 100

## 2022-06-22 MED ORDER — POTASSIUM CHLORIDE CRYS ER 20 MEQ PO TBCR
40.0000 meq | EXTENDED_RELEASE_TABLET | ORAL | Status: AC
  Administered 2022-06-22 (×2): 40 meq via ORAL
  Filled 2022-06-22 (×2): qty 2

## 2022-06-22 MED ORDER — MAGNESIUM SULFATE IN D5W 1-5 GM/100ML-% IV SOLN
1.0000 g | Freq: Once | INTRAVENOUS | Status: AC
Start: 2022-06-22 — End: 2022-06-22
  Administered 2022-06-22: 1 g via INTRAVENOUS
  Filled 2022-06-22: qty 100

## 2022-06-22 NOTE — Progress Notes (Signed)
   06/22/22 1409  Assess: MEWS Score  Temp 98.7 F (37.1 C)  BP (!) 135/55  MAP (mmHg) 78  Pulse Rate 70  Resp (!) 28  SpO2 97 %  O2 Device Room Air  Assess: MEWS Score  MEWS Temp 0  MEWS Systolic 0  MEWS Pulse 0  MEWS RR 2  MEWS LOC 0  MEWS Score 2  MEWS Score Color Yellow  Assess: if the MEWS score is Yellow or Red  Were vital signs taken at a resting state? Yes  Focused Assessment Change from prior assessment (see assessment flowsheet)  MEWS guidelines implemented *See Row Information* Yes  Treat  MEWS Interventions Escalated (See documentation below)  Pain Scale 0-10  Pain Score 0  Take Vital Signs  Increase Vital Sign Frequency  Yellow: Q 2hr X 2 then Q 4hr X 2, if remains yellow, continue Q 4hrs  Escalate  MEWS: Escalate Yellow: discuss with charge nurse/RN and consider discussing with provider and RRT  Notify: Charge Nurse/RN  Name of Charge Nurse/RN Notified Audrea Muscat, RN  Date Charge Nurse/RN Notified 06/22/22  Time Charge Nurse/RN Notified 1443  Assess: SIRS CRITERIA  SIRS Temperature  0  SIRS Pulse 0  SIRS Respirations  1  SIRS WBC 1  SIRS Score Sum  2

## 2022-06-22 NOTE — Progress Notes (Signed)
PROGRESS NOTE     Jared Bridges, is a 88 y.o. male, DOB - [birth year], QVZ:[id number]  Admit date - 06/15/2022   Admitting Physician Bernadette Hoit, DO  Outpatient Primary MD for the patient is [name], MD  LOS - 6  Chief Complaint  Patient presents with   Weakness        Brief Narrative:  Brief Summary:-  88 y.o. male with medical history significant of hypertension, hyperlipidemia, T2DM, prostate cancer with osseous metastasis, BPH, hearing impairment, diabetic polyneuropathy admitted on 06/16/2022 with septic shock due to Morganella morganii bacteremia presumably from urinary source    -Assessment and Plan:   A/p 1) Morganella Morgagni septic shock from presumed urinary source--POA -Patient met sepsis criteria on admission -Lactic acidosis and leukocytosis noted WBC 12.6 >>>39.7>> 27.6>>> 7.7 Lactic acid 5.4 >>2.0>>> within normal limits. -Required Levophed and vasopressin for pressure support as well as IV fluids until 06/18/2022; patient has remained off pressure support and demonstrating stable vital signs. -Continue treatment with meropenem; multidrug-resistant results appreciated by sensitivity.  Possible option for patient is the use of Bactrim when able to take by mouth at time of discharge. -Planning for evaluation by speech therapy with MBS on 06/23/2022.  Will follow recommendations by speech therapy.   2) history of urinary retention/BPH with LUTS--- unable to void, given hemodynamic instability and the need to monitor urine output accurately Foley catheter/coud catheter placed on 06/16/2022 with 400 mL of urine removed -Now that his blood pressure has improved and is no longer requiring pressors Proscar will be resume. -Continue to closely follow urine output. -Will attempt voiding trial today (06/22/2022).; if patient failed voiding trial will discharge with catheter in place and outpatient follow-up with urology service.[name] Kitchen   3) social/ethics--- plan of  care discussed with patient's daughter and wife -Patient DNR/DNI CODE STATUS has been revoked after discussing with patient's daughter.  She would like to see how the patient's continue to to work/respond prior to make any final decisions.  She will continue talking with patient itself and the rest of the family to come up with a living will. -Palliative care consult appreciated, will follow further recommendations. -Overall prognosis is guarded -Patient is now full code.  4)HFpEF--- prior echo with EF of 65% with grade 1 diastolic dysfunction -Patient continues to require IV fluids at this time due to sepsis. -Continue to closely follow volume status as patient is at risk for fluid overload. -Continue to monitor volume status.  5) acute on chronic anemia--- patient with anemia of chronic disease. -Decrease in his hemoglobin due to hemodilution due to IV fluids for sepsis. -No overt bleeding appreciated -Continue to follow hemoglobin trend intermittently and transfuse as needed. -CBC demonstrating hemoglobin 8.9, WBCs 7.7.  Platelets count 121 K.  6)DM2--- prior A1c 6.4 reflecting good diabetic control PTA -Continue sliding scale insulin and close monitoring of patient's CBGs to further adjust management as needed.  7) dysphagia--- speech pathologist assistance and recommendations appreciated. -Planning for MBS once more alert and interactive; tentatively scheduled for 06/23/2022; will follow recommendations... -Patient's diet has been advanced to dysphagia 2 with nectar thick liquids based on bedside evaluation.  Status is: Inpatient   Disposition: The patient is from: Home              Anticipated d/c is to:  SNF for short-term rehab              Anticipated d/c date is: > 3 days  Patient currently reaching medical stability slowly; anticipate being ready in the next 48 hours.  Skilled nursing facility has been recommended by physical therapy  Barriers: Not Clinically  Stable-   Code Status :  -  Code Status: Full Code   Family Communication:   No family at bedside at time of my evaluation.  DVT Prophylaxis  :   - SCDs  enoxaparin (LOVENOX) injection 40 mg Start: 06/16/22 2200 SCDs Start: 06/16/22 0624   Lab Results  Component Value Date   PLT 121 (L) 06/21/2022    Inpatient Medications  Scheduled Meds:  atorvastatin  40 mg Oral Daily   Chlorhexidine Gluconate Cloth  6 each Topical Q0600   enoxaparin (LOVENOX) injection  40 mg Subcutaneous Q24H   finasteride  5 mg Oral Daily   insulin aspart  0-5 Units Subcutaneous QHS   insulin aspart  0-6 Units Subcutaneous TID WC   pantoprazole sodium  40 mg Oral Daily   Continuous Infusions:  sodium chloride Stopped (06/16/22 0730)   sodium chloride 75 mL/hr at 06/22/22 1539   meropenem (MERREM) IV 1 g (06/22/22 1110)   PRN Meds:.acetaminophen **OR** acetaminophen   Anti-infectives (From admission, onward)    Start     Dose/Rate Route Frequency Ordered Stop   06/20/22 1800  meropenem (MERREM) 1 g in sodium chloride 0.9 % 100 mL IVPB        1 g 200 mL/hr over 30 Minutes Intravenous Every 8 hours 06/20/22 1109     06/18/22 1000  meropenem (MERREM) 1 g in sodium chloride 0.9 % 100 mL IVPB  Status:  Discontinued        1 g 200 mL/hr over 30 Minutes Intravenous Every 8 hours 06/18/22 0855 06/20/22 1109   06/17/22 1530  meropenem (MERREM) 1 g in sodium chloride 0.9 % 100 mL IVPB  Status:  Discontinued        1 g 200 mL/hr over 30 Minutes Intravenous Every 12 hours 06/17/22 1431 06/18/22 0855   06/16/22 1615  ceFEPIme (MAXIPIME) 2 g in sodium chloride 0.9 % 100 mL IVPB  Status:  Discontinued        2 g 200 mL/hr over 30 Minutes Intravenous Every 12 hours 06/16/22 0400 06/16/22 0441   06/16/22 1600  ceFEPIme (MAXIPIME) 2 g in sodium chloride 0.9 % 100 mL IVPB  Status:  Discontinued        2 g 200 mL/hr over 30 Minutes Intravenous Every 12 hours 06/16/22 0441 06/17/22 1431   06/16/22 1000  fluconazole  (DIFLUCAN) IVPB 200 mg  Status:  Discontinued        200 mg 100 mL/hr over 60 Minutes Intravenous Every 24 hours 06/16/22 0836 06/20/22 1157   06/16/22 0400  ceFEPIme (MAXIPIME) 2 g in sodium chloride 0.9 % 100 mL IVPB        2 g 200 mL/hr over 30 Minutes Intravenous  Once 06/16/22 0348 06/16/22 0451         Subjective: [name] increasing respiratory rate appreciated; no requiring oxygen supplementation.  Patient is afebrile, no chest pain, no nausea, no vomiting.  Planning to remove Foley catheter and hopefully assess MBS 06/23/2022.  Objective: Vitals:   06/21/22 1344 06/21/22 2108 06/22/22 0500 06/22/22 1409  BP: (!) 143/60 (!) 151/75 (!) 150/70 (!) 135/55  Pulse: (!) 52 66 (!) 58 70  Resp: (!) 24   (!) 28  Temp: 97.8 F (36.6 C) 97.9 F (36.6 C) 97.6 F (36.4 C) 98.7 F (37.1  C)  TempSrc: Oral Oral  Oral  SpO2: 98% 97% 97% 97%  Weight:   86.4 kg   Height:        Intake/Output Summary (Last 24 hours) at 06/22/2022 1648 Last data filed at 06/22/2022 1600 Gross per 24 hour  Intake 2263 ml  Output 7900 ml  Net -5637 ml   Filed Weights   06/20/22 0440 06/21/22 0500 06/22/22 0500  Weight: 90 kg 93.9 kg 86.4 kg    Physical Exam General exam: Alert, awake, oriented x 3; in no acute distress.  Overnight with some mild increase in respiratory rate.  No fever, no nausea, no vomiting.  Good urine output appreciated.  Foley catheter in place. Respiratory system: Positive rhonchi and increased upper airway secretions on examination.  No using accessory muscle.  No wheezing, no crackles. Cardiovascular system: Rate controlled, no rubs, no gallops, no JVD. Gastrointestinal system: Abdomen is nondistended, soft and nontender. No organomegaly or masses felt. Normal bowel sounds heard. Central nervous system: Alert and oriented. No new focal neurological deficits. Extremities: No cyanosis or clubbing. Skin: No petechiae. Psychiatry: Mood & affect appropriate.   Data Reviewed:  I have personally reviewed following labs and imaging studies  CBC: Recent Labs  Lab 06/15/22 2213 06/17/22 0338 06/18/22 0359 06/21/22 0545  WBC 12.6* 39.7* 27.6* 7.7  HGB 10.5* 8.7* 8.5* 8.9*  HCT 33.3* 27.8* 27.1* 29.0*  MCV 90.5 92.1 91.9 91.8  PLT 238 158 115* 161*   Basic Metabolic Panel: Recent Labs  Lab 06/15/22 2213 06/16/22 0401 06/17/22 0338 06/18/22 0359 06/21/22 0545 06/22/22 1040  NA 137  --  136 138 142 137  K 3.8  --  3.9 3.2* 2.6* 2.9*  CL 106  --  109 111 115* 107  CO2 24  --  21* _0 GLUCOSE 139*  --  126* 76 85 136*  BUN 27*  --  32* 34* 17 8  CREATININE 1.15  --  1.24 0.87 0.59* 0.53*  CALCIUM 9.1  --  8.1* 7.8* 7.8* 7.5*  MG  --  1.9  --   --  1.9  --   PHOS  --  3.0  --   --   --   --    GFR: Estimated Creatinine Clearance: 69.4 mL/min (A) (by C-G formula based on SCr of 0.53 mg/dL (L)).  Liver Function Tests: Recent Labs  Lab 06/16/22 0401 06/17/22 0338 06/18/22 0359  AST 15 33 26  ALT _1 ALKPHOS 69 47 58  BILITOT 1.0 0.6 0.6  PROT 5.9* 5.0* 5.0*  ALBUMIN 2.6* 2.1* 2.0*   Sepsis Labs:  Recent Results (from the past 240 hour(s))  Urine Culture     Status: Abnormal   Collection Time: 06/15/22  9:37 PM   Specimen: In/Out Cath Urine  Result Value Ref Range Status   Specimen Description   Final    IN/OUT CATH URINE Performed at [facility], [address], [place], [place] [address]    Special Requests   Final    NONE Performed at [facility], [address], [place], [place] [address]    Culture MULTIPLE SPECIES PRESENT, SUGGEST RECOLLECTION (A)  Final   Report Status 06/17/2022 FINAL  Final  Blood Culture (routine x 2)     Status: Abnormal   Collection Time: 06/16/22  3:38 AM   Specimen: BLOOD RIGHT ARM  Result Value Ref Range Status   Specimen Description   Final    BLOOD  RIGHT ARM Performed at [facility], [address] [place], [place] [address]    Special Requests   Final    BOTTLES DRAWN AEROBIC AND  ANAEROBIC Blood Culture adequate volume Performed at [facility] [place], [place] [address]    Culture  Setup Time   Final    GRAM NEGATIVE RODS BOTTLES DRAWN AEROBIC AND ANAEROBIC Gram Stain Report Called to,Read Back By and Verified With: Quentin Angst RN [phone] K FORSYTH CRITICAL RESULT CALLED TO, READ BACK BY AND VERIFIED WITH: RN ROBERT MAYNARD 06/17/22_0 :26 BY TW Performed at [facility], [place] [address] [place], [place] [address]    Culture [facility] MORGANII (A)  Final   Report Status 06/18/2022 FINAL  Final   Organism ID, Bacteria MORGANELLA MORGANII  Final      Susceptibility   Morganella morganii - MIC*    AMPICILLIN >=32 RESISTANT Resistant     CEFAZOLIN >=64 RESISTANT Resistant     CEFTAZIDIME 16 INTERMEDIATE Intermediate     CIPROFLOXACIN <=0.25 SENSITIVE Sensitive     GENTAMICIN <=1 SENSITIVE Sensitive     IMIPENEM 4 SENSITIVE Sensitive     TRIMETH/SULFA <=20 SENSITIVE Sensitive     AMPICILLIN/SULBACTAM >=32 RESISTANT Resistant     PIP/TAZO <=4 SENSITIVE Sensitive     * MORGANELLA MORGANII  Blood Culture ID Panel (Reflexed)     Status: Abnormal   Collection Time: 06/16/22  3:38 AM  Result Value Ref Range Status   Enterococcus faecalis NOT DETECTED NOT DETECTED Final   Enterococcus Faecium NOT DETECTED NOT DETECTED Final   Listeria monocytogenes NOT DETECTED NOT DETECTED Final   Staphylococcus species NOT DETECTED NOT DETECTED Final   Staphylococcus aureus (BCID) NOT DETECTED NOT DETECTED Final   Staphylococcus epidermidis NOT DETECTED NOT DETECTED Final   Staphylococcus lugdunensis NOT DETECTED NOT DETECTED Final   Streptococcus species NOT DETECTED NOT DETECTED Final   Streptococcus agalactiae NOT DETECTED NOT DETECTED Final   Streptococcus pneumoniae NOT DETECTED NOT DETECTED Final   Streptococcus pyogenes NOT DETECTED NOT DETECTED Final   A.calcoaceticus-baumannii NOT DETECTED NOT DETECTED Final   Bacteroides fragilis NOT DETECTED  NOT DETECTED Final   Enterobacterales DETECTED (A) NOT DETECTED Final    Comment: Enterobacterales represent a large order of gram negative bacteria, not a single organism. Refer to culture for further identification. CRITICAL RESULT CALLED TO, READ BACK BY AND VERIFIED WITH: RN ROBERT MAYNARD 06/17/22_1 :26 BY TW    Enterobacter cloacae complex NOT DETECTED NOT DETECTED Final   Escherichia coli NOT DETECTED NOT DETECTED Final   Klebsiella aerogenes NOT DETECTED NOT DETECTED Final   Klebsiella oxytoca NOT DETECTED NOT DETECTED Final   Klebsiella pneumoniae NOT DETECTED NOT DETECTED Final   Proteus species NOT DETECTED NOT DETECTED Final   Salmonella species NOT DETECTED NOT DETECTED Final   Serratia marcescens NOT DETECTED NOT DETECTED Final   Haemophilus influenzae NOT DETECTED NOT DETECTED Final   Neisseria meningitidis NOT DETECTED NOT DETECTED Final   Pseudomonas aeruginosa NOT DETECTED NOT DETECTED Final   Stenotrophomonas maltophilia NOT DETECTED NOT DETECTED Final   Candida albicans NOT DETECTED NOT DETECTED Final   Candida auris NOT DETECTED NOT DETECTED Final   Candida glabrata NOT DETECTED NOT DETECTED Final   Candida krusei NOT DETECTED NOT DETECTED Final   Candida parapsilosis NOT DETECTED NOT DETECTED Final   Candida tropicalis NOT DETECTED NOT DETECTED Final   Cryptococcus neoformans/gattii NOT DETECTED NOT DETECTED Final   CTX-M ESBL NOT DETECTED NOT DETECTED  Final   Carbapenem resistance IMP NOT DETECTED NOT DETECTED Final   Carbapenem resistance KPC NOT DETECTED NOT DETECTED Final   Carbapenem resistance NDM NOT DETECTED NOT DETECTED Final   Carbapenem resist OXA 48 LIKE NOT DETECTED NOT DETECTED Final   Carbapenem resistance VIM NOT DETECTED NOT DETECTED Final    Comment: Performed at [facility], [place] [address] [place] [address], [place], [place] [address]  Resp Panel by RT-PCR (Flu A&B, Covid) Anterior Nasal Swab     Status: None   Collection Time: 06/16/22  3:48 AM    Specimen: Anterior Nasal Swab  Result Value Ref Range Status   SARS Coronavirus 2 by RT PCR NEGATIVE NEGATIVE Final    Comment: (NOTE) SARS-CoV-2 target nucleic acids are NOT DETECTED.  The SARS-CoV-2 RNA is generally detectable in upper respiratory specimens during the acute phase of infection. The lowest concentration of SARS-CoV-2 viral copies this assay can detect is 138 copies/mL. A negative result does not preclude SARS-Cov-2 infection and should not be used as the sole basis for treatment or other patient management decisions. A negative result may occur with  improper specimen collection/handling, submission of specimen other than nasopharyngeal swab, presence of viral mutation(s) within the areas targeted by this assay, and inadequate number of viral copies(<138 copies/mL). A negative result must be combined with clinical observations, patient history, and epidemiological information. The expected result is Negative.  Fact Sheet for Patients:  EntrepreneurPulse.com.au  Fact Sheet for Healthcare Providers:  IncredibleEmployment.be  This test is no t yet approved or cleared by the [place] FDA and  has been authorized for detection and/or diagnosis of SARS-CoV-2 by FDA under an Emergency Use Authorization (EUA). This EUA will remain  in effect (meaning this test can be used) for the duration of the COVID-19 declaration under Section 564(b)(1) of the Act, 21 U.S.C.section 360bbb-3(b)(1), unless the authorization is terminated  or revoked sooner.       Influenza A by PCR NEGATIVE NEGATIVE Final   Influenza B by PCR NEGATIVE NEGATIVE Final    Comment: (NOTE) The Xpert Xpress SARS-CoV-2/FLU/RSV plus assay is intended as an aid in the diagnosis of influenza from Nasopharyngeal swab specimens and should not be used as a sole basis for treatment. Nasal washings and aspirates are unacceptable for Xpert Xpress  SARS-CoV-2/FLU/RSV testing.  Fact Sheet for Patients: EntrepreneurPulse.com.au  Fact Sheet for Healthcare Providers: IncredibleEmployment.be  This test is not yet approved or cleared by the [place] FDA and has been authorized for detection and/or diagnosis of SARS-CoV-2 by FDA under an Emergency Use Authorization (EUA). This EUA will remain in effect (meaning this test can be used) for the duration of the COVID-19 declaration under Section 564(b)(1) of the Act, 21 U.S.C. section 360bbb-3(b)(1), unless the authorization is terminated or revoked.  Performed at [facility], [address], [place], [place] [address]   Blood Culture (routine x 2)     Status: Abnormal   Collection Time: 06/16/22  4:07 AM   Specimen: BLOOD RIGHT HAND  Result Value Ref Range Status   Specimen Description   Final    BLOOD RIGHT HAND Performed at [facility], [address], [place], [place] [address]    Special Requests   Final    BOTTLES DRAWN AEROBIC AND ANAEROBIC Blood Culture adequate volume Performed at [facility], [address] [place], [place] [address]    Culture  Setup Time   Final    GRAM NEGATIVE RODS BOTTLES DRAWN AEROBIC AND ANAEROBIC CRITICAL  VALUE NOTED.  VALUE IS CONSISTENT WITH PREVIOUSLY REPORTED AND CALLED VALUE. Performed at [facility], [address] [place], [place] [address]    Culture (A)  Final    MORGANELLA MORGANII SUSCEPTIBILITIES PERFORMED ON PREVIOUS CULTURE WITHIN THE LAST 5 DAYS. Performed at [facility], [place] [address], [place], [place] [address]    Report Status 06/18/2022 FINAL  Final  MRSA Next Gen by PCR, Nasal     Status: None   Collection Time: 06/16/22  6:04 AM   Specimen: Nasal Mucosa; Nasal Swab  Result Value Ref Range Status   MRSA by PCR Next Gen NOT DETECTED NOT DETECTED Final    Comment: (NOTE) The GeneXpert MRSA Assay (FDA approved for NASAL specimens only), is one component of a comprehensive  MRSA colonization surveillance program. It is not intended to diagnose MRSA infection nor to guide or monitor treatment for MRSA infections. Test performance is not FDA approved in patients less than 51 years old. Performed at [facility], [address] [place], [place] [address]      Radiology Studies: No results found.  Scheduled Meds:  atorvastatin  40 mg Oral Daily   Chlorhexidine Gluconate Cloth  6 each Topical Q0600   enoxaparin (LOVENOX) injection  40 mg Subcutaneous Q24H   finasteride  5 mg Oral Daily   insulin aspart  0-5 Units Subcutaneous QHS   insulin aspart  0-6 Units Subcutaneous TID WC   pantoprazole sodium  40 mg Oral Daily   Continuous Infusions:  sodium chloride Stopped (06/16/22 0730)   sodium chloride 75 mL/hr at 06/22/22 1539   meropenem (MERREM) IV 1 g (06/22/22 1110)     LOS: 6 days    Barton Dubois M.D on 06/22/2022 at 4:48 PM  Go to [email or web address] - for contact info  [facility] - Office  [phone]  If 7PM-7AM, please contact night-coverage [email or web address] 06/22/2022, 4:48 PM

## 2022-06-22 NOTE — Progress Notes (Signed)
Central monitoring called this nurse and reported that pt had 11 beats of wide QRS at 1037 and his rhythm has been irregular. MD is aware. No new orders at this time and will continue to monitor pt.

## 2022-06-23 ENCOUNTER — Inpatient Hospital Stay (HOSPITAL_COMMUNITY): Payer: Medicare Other

## 2022-06-23 DIAGNOSIS — I10 Essential (primary) hypertension: Secondary | ICD-10-CM | POA: Diagnosis not present

## 2022-06-23 DIAGNOSIS — I5032 Chronic diastolic (congestive) heart failure: Secondary | ICD-10-CM | POA: Diagnosis not present

## 2022-06-23 DIAGNOSIS — G9340 Encephalopathy, unspecified: Secondary | ICD-10-CM | POA: Diagnosis not present

## 2022-06-23 DIAGNOSIS — N401 Enlarged prostate with lower urinary tract symptoms: Secondary | ICD-10-CM | POA: Diagnosis not present

## 2022-06-23 LAB — BASIC METABOLIC PANEL
Anion gap: 5 (ref 5–15)
BUN: 8 mg/dL (ref 8–23)
CO2: 28 mmol/L (ref 22–32)
Calcium: 7.7 mg/dL — ABNORMAL LOW (ref 8.9–10.3)
Chloride: 106 mmol/L (ref 98–111)
Creatinine, Ser: 0.52 mg/dL — ABNORMAL LOW (ref 0.61–1.24)
GFR, Estimated: >60 mL/min (ref >60)
Glucose, Bld: 112 mg/dL — ABNORMAL HIGH (ref 70–99)
Potassium: 3.3 mmol/L — ABNORMAL LOW (ref 3.5–5.1)
Sodium: 139 mmol/L (ref 135–145)

## 2022-06-23 LAB — GLUCOSE, CAPILLARY
Glucose-Capillary: 107 mg/dL — ABNORMAL HIGH (ref 70–99)
Glucose-Capillary: 101 mg/dL — ABNORMAL HIGH (ref 70–99)
Glucose-Capillary: 137 mg/dL — ABNORMAL HIGH (ref 70–99)
Glucose-Capillary: 125 mg/dL — ABNORMAL HIGH (ref 70–99)

## 2022-06-23 MED ORDER — POTASSIUM CHLORIDE CRYS ER 20 MEQ PO TBCR
40.0000 meq | EXTENDED_RELEASE_TABLET | ORAL | Status: AC
  Administered 2022-06-23 (×2): 40 meq via ORAL
  Filled 2022-06-23 (×2): qty 2

## 2022-06-23 MED ORDER — SULFAMETHOXAZOLE-TRIMETHOPRIM 800-160 MG PO TABS
1.0000 | ORAL_TABLET | Freq: Two times a day (BID) | ORAL | Status: DC
  Administered 2022-06-23 – 2022-06-24 (×2): 1 via ORAL
  Filled 2022-06-23 (×2): qty 1

## 2022-06-23 MED ORDER — FUROSEMIDE 10 MG/ML IJ SOLN
20.0000 mg | Freq: Once | INTRAMUSCULAR | Status: AC
Start: 2022-06-23 — End: 2022-06-23
  Administered 2022-06-23: 20 mg via INTRAVENOUS
  Filled 2022-06-23: qty 2

## 2022-06-23 NOTE — TOC Initial Note (Addendum)
Transition of Care [facility]) - Initial/Assessment Note    Patient Details  Name: Jared Bridges MRN: [id number] Date of Birth: 1930/02/07  Transition of Care [facility] CM/SW Contact:    Iona Beard, [place] Phone Number: 06/23/2022, 1:27 PM  Clinical Narrative:                 CSW spoke with pts daughter about PT recommendation for SNF. Family agreeable to this recommendation and preference is [facility]. CSW completed Fl2 and sent referral out. CSW updated by [facility] of pt being on Xtandi which is a costly medication. [facility] is requesting that family bring the medication as the facility cannot cover. CSW spoke with pts daughter in room about this. Family states that this medication has been D/C'd at this time and if it is started back they are able to provide it to the facility. CSW left VM with Kerri at [facility] to provide update. TOC to follow.   Addendum: CSW updated that [facility] is offering a bed for pt. Pts family would like to accept bed. TOC to start insurance auth at this time. TOC to follow.  Expected Discharge Plan: Skilled Nursing Facility Barriers to Discharge: Continued Medical Work up   Patient Goals and CMS Choice Patient states their goals for this hospitalization and ongoing recovery are:: Go to SNF CMS Medicare.gov Compare Post Acute Care list provided to:: Patient Represenative (must comment) Choice offered to / list presented to : Adult Children, Patient, Spouse  Expected Discharge Plan and Services Expected Discharge Plan: Skilled Nursing Facility In-house Referral: Clinical Social Work Discharge Planning Services: CM Consult Post Acute Care Choice: [place] arrangements for the past 2 months: [place]                                      Prior Living Arrangements/Services Living arrangements for the past 2 months: Single Family Home Lives with:: Spouse Patient language and need for interpreter reviewed:: Yes Do you feel safe going back  to the place where you live?: Yes      Need for Family Participation in Patient Care: Yes (Comment) Care giver support system in place?: Yes (comment) Current home services: DME (Rollator, cane, walker, wheelchair) Criminal Activity/Legal Involvement Pertinent to Current Situation/Hospitalization: No - Comment as needed  Activities of Daily Living Home Assistive Devices/Equipment: Wheelchair ADL Screening (condition at time of admission) Patient's cognitive ability adequate to safely complete daily activities?: No Is the patient deaf or have difficulty hearing?: No Does the patient have difficulty seeing, even when wearing glasses/contacts?: No Does the patient have difficulty concentrating, remembering, or making decisions?: Yes Patient able to express need for assistance with ADLs?: Yes Does the patient have difficulty dressing or bathing?: Yes Independently performs ADLs?: No Communication: Dependent Is this a change from baseline?: Pre-admission baseline Dressing (OT): Dependent Is this a change from baseline?: Pre-admission baseline Grooming: Dependent Is this a change from baseline?: Pre-admission baseline Feeding: Dependent Is this a change from baseline?: Pre-admission baseline Bathing: Dependent Is this a change from baseline?: Pre-admission baseline Toileting: Dependent Is this a change from baseline?: Pre-admission baseline In/Out Bed: Dependent Is this a change from baseline?: Pre-admission baseline Walks in Home: Dependent Is this a change from baseline?: Pre-admission baseline Does the patient have difficulty walking or climbing stairs?: Yes Weakness of Legs: Both Weakness of Arms/Hands: Both  Permission Sought/Granted  Emotional Assessment         Alcohol / Substance Use: Not Applicable Psych Involvement: No (comment)  Admission diagnosis:  Encephalopathy [G93.40] Elevated BUN [R79.9] Sepsis secondary to UTI ([employer]) [A41.9,  N39.0] Normochromic normocytic anemia [D64.9] Sepsis due to undetermined organism ([place]) [A41.9] Urinary tract infection with hematuria, site unspecified [N39.0, R31.9] Patient Active Problem List   Diagnosis Date Noted   Septic shock ([place]) 06/16/2022   Lactic acidosis 06/16/2022   Mixed hyperlipidemia 06/16/2022   Chronic diastolic CHF (congestive heart failure) ([place]) 06/16/2022   GERD (gastroesophageal reflux disease) 06/16/2022   Sepsis due to undetermined organism ([place])    Loss of weight 03/10/2022   History of peptic ulcer disease 09/03/2021   Wheelchair dependence 09/02/2021   Quadriplegia ([place]) 09/02/2021   Sleep disturbance    Acute blood loss anemia    Urinary incontinence    Controlled type 2 diabetes mellitus with hyperglycemia, without long-term current use of insulin ([place])    Central cord syndrome at C7 level of cervical spinal cord ([name] 07/05/2021   Epidural hematoma ([name] 07/02/2021   Lower urinary tract symptoms (LUTS) 10/31/2020   Prostate cancer ([place]) 06/20/2020   Elevated PSA 04/18/2020   Nocturia 04/18/2020   Cobalamin deficiency 02/22/2020   Intractable chronic migraine without aura 02/20/2020   Altered mental status 02/20/2020   Asthenia 02/20/2020   Diabetic peripheral neuropathy ([place]) 02/20/2020   Obstructive sleep apnea of adult 02/20/2020   Medicare annual wellness visit, subsequent 12/07/2019   Depressed mood 12/07/2019   Vitamin D deficiency 10/25/2019   Memory loss 10/25/2019   Fall 10/25/2019   Nonrheumatic aortic valve stenosis 03/23/2018   Migraine headaches 12/29/2017   TIA (transient ischemic attack) 12/28/2017   Rectal bleeding 11/26/2017   Carpal tunnel syndrome, left 07/22/2017   Acquired trigger finger 07/22/2017   Acute cholecystitis 04/05/2016   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   Total knee replacement status 02/28/2016   CVA (cerebral infarction) 12/19/2015   Acute CVA (cerebrovascular accident) ([place])  12/19/2015   Carotid stenosis 12/19/2015   Postoperative wound dehiscence 07/09/2015   Type 2 diabetes mellitus, uncontrolled 06/29/2015   Cellulitis of knee, left 06/29/2015   Wound infection after surgery 06/29/2015   OA (osteoarthritis) of knee 05/28/2015   BRBPR (bright red blood per rectum) 11/21/2014   Orthostatic hypotension 11/21/2014   Dizziness 11/16/2014   Polymyalgia rheumatica ([place]) 09/12/2014   Urinary tract infection with hematuria 08/16/2014   Leukocytosis 08/16/2014   Anemia, iron deficiency 08/16/2014   DJD (degenerative joint disease) 08/16/2014   Essential hypertension    Type 2 diabetes mellitus ([place])    Cataract, nuclear 04/11/2014   Complaining of back-related symptom 03/15/2014   Arthralgia of hip or thigh 03/15/2014   Gonalgia 10/10/2013   Left knee pain 10/10/2013   1st degree AV block 08/04/2013   Infection of the upper respiratory tract 04/05/2013   Cataract 02/24/2013   Pseudoaphakia 02/24/2013   Episode of syncope 01/11/2013   Cardiac murmur 11/09/2012   Near syncope 11/09/2012   Anxiety 06/18/2012   Clinical depression 06/18/2012   Bleeding gastrointestinal 06/18/2012   Diverticulitis 06/18/2012   Difficulty hearing 06/18/2012   Adaptive colitis 06/18/2012   Arthritis, degenerative 06/18/2012   Disease of thyroid gland 06/18/2012   Hearing impairment 06/18/2012   Multinodular goiter 01/29/2012   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 [place] [address] Phone: [phone] Fax:  [phone]  [facility], [place] [address] Phone: [phone] Fax: [phone]     Social Determinants of Health (SDOH) Interventions    Readmission Risk Interventions    06/23/2022    1:26 PM  Readmission Risk Prevention Plan  Transportation Screening Complete  HRI or [place] Complete  Social Work Consult for [place] Planning/Counseling Complete  Palliative Care Screening Not Applicable  Medication Review Press photographer) Complete

## 2022-06-23 NOTE — Progress Notes (Signed)
PROGRESS NOTE     Jared Bridges, is a 88 y.o. male, DOB - [birth year], MGQ:[id number]  Admit date - 06/15/2022   Admitting Physician Bernadette Hoit, DO  Outpatient Primary MD for the patient is [name], MD  LOS - 7  Chief Complaint  Patient presents with   Weakness        Brief Narrative:  Brief Summary:-  88 y.o. male with medical history significant of hypertension, hyperlipidemia, T2DM, prostate cancer with osseous metastasis, BPH, hearing impairment, diabetic polyneuropathy admitted on 06/16/2022 with septic shock due to Morganella morganii bacteremia presumably from urinary source    -Assessment and Plan:   A/p 1) Morganella Morgagni septic shock from presumed urinary source--POA -Patient met sepsis criteria on admission -Lactic acidosis and leukocytosis noted WBC 12.6 >>>39.7>> 27.6>>> 7.7 Lactic acid 5.4 >>2.0>>> within normal limits. -Required Levophed and vasopressin for pressure support as well as IV fluids until 06/18/2022; patient has remained off pressure support and demonstrating stable vital signs. -Will transition antibiotics to oral Bactrim and assess tolerance. -MBS successfully performed and patient's diet upgraded to dysphagia 3 with thin liquids.   2) history of urinary retention/BPH with LUTS--- unable to void, given hemodynamic instability and the need to monitor urine output accurately Foley catheter/coud catheter placed on 06/16/2022 with 400 mL of urine removed -Now that his blood pressure has improved and is no longer requiring pressors Proscar will be resume. -Continue to closely follow urine output. -Successful voiding trial on 06/22/2022; continue close monitoring to determine the need for Foley catheter insertion.   3) social/ethics--- plan of care discussed with patient's daughter and wife -Patient DNR/DNI CODE STATUS has been revoked after discussing with patient's daughter.  She would like to see how the patient's continue to to  work/respond prior to make any final decisions.  She will continue talking with patient itself and the rest of the family to come up with a living will. -Palliative care consult appreciated, will follow further recommendations. -Overall prognosis is guarded. -Patient is now full code. -Planning for a skilled nursing facility at discharge for rehabilitation.  4)HFpEF--- prior echo with EF of 65% with grade 1 diastolic dysfunction -Given new onset of shortness of breath chest x-ray repeated demonstrating bilateral atelectasis with pleural effusion. -Probably mild exacerbation of diastolic dysfunction. -IV Lasix x1 will be given -IV fluids at this moment discontinue -Continue to wean off oxygen supplementation -Follow daily weights, adequate hydration and strict I's and O's.  5) acute on chronic anemia--- patient with anemia of chronic disease. -Decrease in his hemoglobin due to hemodilution due to IV fluids for sepsis. -No overt bleeding appreciated -Continue to follow hemoglobin trend intermittently and transfuse as needed. -Most recent hemoglobin level 8.9; repeat CBC in AM.  6)DM2--- prior A1c 6.4 reflecting good diabetic control PTA -Continue sliding scale insulin and continue close monitoring of patient's CBGs to further adjust management as needed.  7) dysphagia--- speech pathologist assistance and recommendations appreciated. -Status post MBS with upgrading his diet to dysphagia 3 and thin liquids. -Appreciate speech therapy assistance and recommendation..  Status is: Inpatient   Disposition: The patient is from: Home              Anticipated d/c is to:  SNF for short-term rehab              Anticipated d/c date is: 48 hours.              Patient currently reaching medical stability slowly; anticipate being ready  in the next 48 hours.  Skilled nursing facility has been recommended by physical therapy  Barriers: Not Clinically Stable-   Code Status :  -  Code Status: Full Code    Family Communication:   No family at bedside at time of my evaluation.  DVT Prophylaxis  :   - SCDs  enoxaparin (LOVENOX) injection 40 mg Start: 06/16/22 2200 SCDs Start: 06/16/22 0624   Lab Results  Component Value Date   PLT 121 (L) 06/21/2022    Inpatient Medications  Scheduled Meds:  atorvastatin  40 mg Oral Daily   enoxaparin (LOVENOX) injection  40 mg Subcutaneous Q24H   finasteride  5 mg Oral Daily   insulin aspart  0-5 Units Subcutaneous QHS   insulin aspart  0-6 Units Subcutaneous TID WC   pantoprazole sodium  40 mg Oral Daily   Continuous Infusions:  sodium chloride Stopped (06/16/22 0730)   meropenem (MERREM) IV 1 g (06/23/22 1711)   PRN Meds:.acetaminophen **OR** acetaminophen   Anti-infectives (From admission, onward)    Start     Dose/Rate Route Frequency Ordered Stop   06/20/22 1800  meropenem (MERREM) 1 g in sodium chloride 0.9 % 100 mL IVPB        1 g 200 mL/hr over 30 Minutes Intravenous Every 8 hours 06/20/22 1109     06/18/22 1000  meropenem (MERREM) 1 g in sodium chloride 0.9 % 100 mL IVPB  Status:  Discontinued        1 g 200 mL/hr over 30 Minutes Intravenous Every 8 hours 06/18/22 0855 06/20/22 1109   06/17/22 1530  meropenem (MERREM) 1 g in sodium chloride 0.9 % 100 mL IVPB  Status:  Discontinued        1 g 200 mL/hr over 30 Minutes Intravenous Every 12 hours 06/17/22 1431 06/18/22 0855   06/16/22 1615  ceFEPIme (MAXIPIME) 2 g in sodium chloride 0.9 % 100 mL IVPB  Status:  Discontinued        2 g 200 mL/hr over 30 Minutes Intravenous Every 12 hours 06/16/22 0400 06/16/22 0441   06/16/22 1600  ceFEPIme (MAXIPIME) 2 g in sodium chloride 0.9 % 100 mL IVPB  Status:  Discontinued        2 g 200 mL/hr over 30 Minutes Intravenous Every 12 hours 06/16/22 0441 06/17/22 1431   06/16/22 1000  fluconazole (DIFLUCAN) IVPB 200 mg  Status:  Discontinued        200 mg 100 mL/hr over 60 Minutes Intravenous Every 24 hours 06/16/22 0836 06/20/22 1157    06/16/22 0400  ceFEPIme (MAXIPIME) 2 g in sodium chloride 0.9 % 100 mL IVPB        2 g 200 mL/hr over 30 Minutes Intravenous  Once 06/16/22 0348 06/16/22 0451         Subjective: Jared Bridges afebrile, no chest pain, no nausea, no vomiting.  Reports feeling down today.  Short of breath reported requiring 2 L nasal cannula supplementation.  Successful voiding trial with removal of Foley catheter.  Weak and deconditioned on examination.  Objective: Vitals:   06/23/22 1215 06/23/22 1220 06/23/22 1221 06/23/22 1222  BP:    135/72  Pulse:    78  Resp:    18  Temp:    97.7 F (36.5 C)  TempSrc:    Oral  SpO2: (!) 88% 90% 92% 96%  Weight:      Height:        Intake/Output Summary (Last 24 hours) at 06/23/2022  [place] filed at 06/23/2022 1300 Gross per 24 hour  Intake 607.05 ml  Output 2100 ml  Net -1492.95 ml   Filed Weights   06/21/22 0500 06/22/22 0500 06/23/22 0420  Weight: 93.9 kg 86.4 kg 87.3 kg    Physical Exam General exam: Alert, awake, oriented x 3; reports feeling down, and short of breath.  Currently requiring again 2 L nasal cannula supplementation to keep saturation above 89%. Respiratory system: No wheezing, no using accessory muscles; positive fine crackles at the bases bilaterally. Cardiovascular system: Rate controlled, no rubs, no gallops, no JVD.  Positive systolic murmur. Gastrointestinal system: Abdomen is nondistended, soft and nontender. No organomegaly or masses felt. Normal bowel sounds heard. Central nervous system: Alert and oriented. No new focal neurological deficits. Extremities: No cyanosis or clubbing. Skin: No petechiae. Psychiatry: Mood & affect appropriate.    Data Reviewed: I have personally reviewed following labs and imaging studies  CBC: Recent Labs  Lab 06/17/22 0338 06/18/22 0359 06/21/22 0545  WBC 39.7* 27.6* 7.7  HGB 8.7* 8.5* 8.9*  HCT 27.8* 27.1* 29.0*  MCV 92.1 91.9 91.8  PLT 158 115* 389*   Basic Metabolic  Panel: Recent Labs  Lab 06/17/22 0338 06/18/22 0359 06/21/22 0545 06/22/22 1040 06/23/22 0530  NA 136 138 142 137 139  K 3.9 3.2* 2.6* 2.9* 3.3*  CL 109 111 115* 107 106  CO2 21* '23 22 26 28  ' GLUCOSE 126* 76 85 136* 112*  BUN 32* 34* '17 8 8  ' CREATININE 1.24 0.87 0.59* 0.53* 0.52*  CALCIUM 8.1* 7.8* 7.8* 7.5* 7.7*  MG  --   --  1.9  --   --    GFR: Estimated Creatinine Clearance: 69.4 mL/min (A) (by C-G formula based on SCr of 0.52 mg/dL (L)).  Liver Function Tests: Recent Labs  Lab 06/17/22 0338 06/18/22 0359  AST 33 26  ALT 16 14  ALKPHOS 47 58  BILITOT 0.6 0.6  PROT 5.0* 5.0*  ALBUMIN 2.1* 2.0*   Sepsis Labs:  Recent Results (from the past 240 hour(s))  Urine Culture     Status: Abnormal   Collection Time: 06/15/22  9:37 PM   Specimen: In/Out Cath Urine  Result Value Ref Range Status   Specimen Description   Final    IN/OUT CATH URINE Performed at [facility], [address] [place], [place] [address]    Special Requests   Final    NONE Performed at [facility], [address], [place], [place] [address]    Culture MULTIPLE SPECIES PRESENT, SUGGEST RECOLLECTION (A)  Final   Report Status 06/17/2022 FINAL  Final  Blood Culture (routine x 2)     Status: Abnormal   Collection Time: 06/16/22  3:38 AM   Specimen: BLOOD RIGHT ARM  Result Value Ref Range Status   Specimen Description   Final    BLOOD RIGHT ARM Performed at [facility], [address], [place], [place] [address]    Special Requests   Final    BOTTLES DRAWN AEROBIC AND ANAEROBIC Blood Culture adequate volume Performed at [facility], [address] [place], [place] [address]    Culture  Setup Time   Final    GRAM NEGATIVE RODS BOTTLES DRAWN AEROBIC AND ANAEROBIC Gram Stain Report Called to,Read Back By and Verified With: Quentin Angst RN [phone] K [place] CRITICAL RESULT CALLED TO, READ BACK BY AND VERIFIED WITH: RN ROBERT MAYNARD 06/17/22'@00' :26 BY TW Performed at [facility],  [address] [place], [place] [address]    Culture [facility] MORGANII (A)  Final   Report Status 06/18/2022 FINAL  Final   Organism ID, Bacteria MORGANELLA MORGANII  Final      Susceptibility   Morganella morganii - MIC*    AMPICILLIN >=32 RESISTANT Resistant     CEFAZOLIN >=64 RESISTANT Resistant     CEFTAZIDIME 16 INTERMEDIATE Intermediate     CIPROFLOXACIN <=0.25 SENSITIVE Sensitive     GENTAMICIN <=1 SENSITIVE Sensitive     IMIPENEM 4 SENSITIVE Sensitive     TRIMETH/SULFA <=20 SENSITIVE Sensitive     AMPICILLIN/SULBACTAM >=32 RESISTANT Resistant     PIP/TAZO <=4 SENSITIVE Sensitive     * MORGANELLA MORGANII  Blood Culture ID Panel (Reflexed)     Status: Abnormal   Collection Time: 06/16/22  3:38 AM  Result Value Ref Range Status   Enterococcus faecalis NOT DETECTED NOT DETECTED Final   Enterococcus Faecium NOT DETECTED NOT DETECTED Final   Listeria monocytogenes NOT DETECTED NOT DETECTED Final   Staphylococcus species NOT DETECTED NOT DETECTED Final   Staphylococcus aureus (BCID) NOT DETECTED NOT DETECTED Final   Staphylococcus epidermidis NOT DETECTED NOT DETECTED Final   Staphylococcus lugdunensis NOT DETECTED NOT DETECTED Final   Streptococcus species NOT DETECTED NOT DETECTED Final   Streptococcus agalactiae NOT DETECTED NOT DETECTED Final   Streptococcus pneumoniae NOT DETECTED NOT DETECTED Final   Streptococcus pyogenes NOT DETECTED NOT DETECTED Final   A.calcoaceticus-baumannii NOT DETECTED NOT DETECTED Final   Bacteroides fragilis NOT DETECTED NOT DETECTED Final   Enterobacterales DETECTED (A) NOT DETECTED Final    Comment: Enterobacterales represent a large order of gram negative bacteria, not a single organism. Refer to culture for further identification. CRITICAL RESULT CALLED TO, READ BACK BY AND VERIFIED WITH: RN ROBERT MAYNARD 06/17/22'@00' :26 BY TW    Enterobacter cloacae complex NOT DETECTED NOT DETECTED Final   Escherichia coli NOT DETECTED NOT DETECTED Final    Klebsiella aerogenes NOT DETECTED NOT DETECTED Final   Klebsiella oxytoca NOT DETECTED NOT DETECTED Final   Klebsiella pneumoniae NOT DETECTED NOT DETECTED Final   Proteus species NOT DETECTED NOT DETECTED Final   Salmonella species NOT DETECTED NOT DETECTED Final   Serratia marcescens NOT DETECTED NOT DETECTED Final   Haemophilus influenzae NOT DETECTED NOT DETECTED Final   Neisseria meningitidis NOT DETECTED NOT DETECTED Final   Pseudomonas aeruginosa NOT DETECTED NOT DETECTED Final   Stenotrophomonas maltophilia NOT DETECTED NOT DETECTED Final   Candida albicans NOT DETECTED NOT DETECTED Final   Candida auris NOT DETECTED NOT DETECTED Final   Candida glabrata NOT DETECTED NOT DETECTED Final   Candida krusei NOT DETECTED NOT DETECTED Final   Candida parapsilosis NOT DETECTED NOT DETECTED Final   Candida tropicalis NOT DETECTED NOT DETECTED Final   Cryptococcus neoformans/gattii NOT DETECTED NOT DETECTED Final   CTX-M ESBL NOT DETECTED NOT DETECTED Final   Carbapenem resistance IMP NOT DETECTED NOT DETECTED Final   Carbapenem resistance KPC NOT DETECTED NOT DETECTED Final   Carbapenem resistance NDM NOT DETECTED NOT DETECTED Final   Carbapenem resist OXA 48 LIKE NOT DETECTED NOT DETECTED Final   Carbapenem resistance VIM NOT DETECTED NOT DETECTED Final    Comment: Performed at [facility], [address], [place], [place] [address]  Resp Panel by RT-PCR (Flu A&B, Covid) Anterior Nasal Swab     Status: None   Collection Time: 06/16/22  3:48 AM   Specimen: Anterior Nasal Swab  Result Value Ref Range  Status   SARS Coronavirus 2 by RT PCR NEGATIVE NEGATIVE Final    Comment: (NOTE) SARS-CoV-2 target nucleic acids are NOT DETECTED.  The SARS-CoV-2 RNA is generally detectable in upper respiratory specimens during the acute phase of infection. The lowest concentration of SARS-CoV-2 viral copies this assay can detect is 138 copies/mL. A negative result does not preclude  SARS-Cov-2 infection and should not be used as the sole basis for treatment or other patient management decisions. A negative result may occur with  improper specimen collection/handling, submission of specimen other than nasopharyngeal swab, presence of viral mutation(s) within the areas targeted by this assay, and inadequate number of viral copies(<138 copies/mL). A negative result must be combined with clinical observations, patient history, and epidemiological information. The expected result is Negative.  Fact Sheet for Patients:  EntrepreneurPulse.com.au  Fact Sheet for Healthcare Providers:  IncredibleEmployment.be  This test is no t yet approved or cleared by the [place] FDA and  has been authorized for detection and/or diagnosis of SARS-CoV-2 by FDA under an Emergency Use Authorization (EUA). This EUA will remain  in effect (meaning this test can be used) for the duration of the COVID-19 declaration under Section 564(b)(1) of the Act, 21 U.S.C.section 360bbb-3(b)(1), unless the authorization is terminated  or revoked sooner.       Influenza A by PCR NEGATIVE NEGATIVE Final   Influenza B by PCR NEGATIVE NEGATIVE Final    Comment: (NOTE) The Xpert Xpress SARS-CoV-2/FLU/RSV plus assay is intended as an aid in the diagnosis of influenza from Nasopharyngeal swab specimens and should not be used as a sole basis for treatment. Nasal washings and aspirates are unacceptable for Xpert Xpress SARS-CoV-2/FLU/RSV testing.  Fact Sheet for Patients: EntrepreneurPulse.com.au  Fact Sheet for Healthcare Providers: IncredibleEmployment.be  This test is not yet approved or cleared by the [place] FDA and has been authorized for detection and/or diagnosis of SARS-CoV-2 by FDA under an Emergency Use Authorization (EUA). This EUA will remain in effect (meaning this test can be used) for the duration of  the COVID-19 declaration under Section 564(b)(1) of the Act, 21 U.S.C. section 360bbb-3(b)(1), unless the authorization is terminated or revoked.  Performed at [facility], [address], [place], [place] [address]   Blood Culture (routine x 2)     Status: Abnormal   Collection Time: 06/16/22  4:07 AM   Specimen: BLOOD RIGHT HAND  Result Value Ref Range Status   Specimen Description   Final    BLOOD RIGHT HAND Performed at [facility], [address], [place], [place] [address]    Special Requests   Final    BOTTLES DRAWN AEROBIC AND ANAEROBIC Blood Culture adequate volume Performed at [facility], [address] [place], [place] [address]    Culture  Setup Time   Final    GRAM NEGATIVE RODS BOTTLES DRAWN AEROBIC AND ANAEROBIC CRITICAL VALUE NOTED.  VALUE IS CONSISTENT WITH PREVIOUSLY REPORTED AND CALLED VALUE. Performed at [facility], [address] [place], [place] [address]    Culture (A)  Final    MORGANELLA MORGANII SUSCEPTIBILITIES PERFORMED ON PREVIOUS CULTURE WITHIN THE LAST 5 DAYS. Performed at [facility], [place] [address], [place], [place] [address]    Report Status 06/18/2022 FINAL  Final  MRSA Next Gen by PCR, Nasal     Status: None   Collection Time: 06/16/22  6:04 AM   Specimen: Nasal Mucosa; Nasal Swab  Result Value Ref Range Status   MRSA by PCR  Next Gen NOT DETECTED NOT DETECTED Final    Comment: (NOTE) The GeneXpert MRSA Assay (FDA approved for NASAL specimens only), is one component of a comprehensive MRSA colonization surveillance program. It is not intended to diagnose MRSA infection nor to guide or monitor treatment for MRSA infections. Test performance is not FDA approved in patients less than 29 years old. Performed at [facility], [address] [place], [place] [address]      Radiology Studies: DG CHEST PORT 1 VIEW  Result Date: 06/23/2022 CLINICAL DATA:  Worsening dyspnea EXAM: PORTABLE CHEST 1 VIEW COMPARISON:  Radiographs  06/17/2022 FINDINGS: New layering small bilateral pleural effusions and associated atelectasis. Stable cardiomediastinal silhouette. Aortic calcifications. Small curvilinear densities projecting over the expected location of the stomach likely due to residual barium within the stomach from modified barium swallow study earlier today. Surgical clips left upper quadrant. The previous left subclavian SVC has been removed. IMPRESSION: New layering bilateral pleural effusions with increased bilateral lower lung atelectasis. Electronically Signed   By: Placido Sou M.D.   On: 06/23/2022 13:01   DG Swallowing Func-Speech Pathology  Result Date: 06/23/2022 Table formatting from the original result was not included. Images from the original result were not included. Objective Swallowing Evaluation: Type of Study: MBS-Modified Barium Swallow Study  Patient Details Name: Jared Bridges MRN: [id number] Date of Birth: [birth year] Today's Date: 06/23/2022 Time: SLP Start Time (ACUTE ONLY): 0803 -SLP Stop Time (ACUTE ONLY): 0825 SLP Time Calculation (min) (ACUTE ONLY): 22 min Past Medical History: Past Medical History: Diagnosis Date  Anemia, iron deficiency   Chronic headaches   Migraines  Chronic kidney disease   Resolved  Depression   Diverticulitis   Essential hypertension   GI bleed   Dr. Laural Golden - 1998  Hypercholesteremia   Orthostatic hypotension   Peptic ulcer disease   Peripheral neuropathy   Polymyalgia rheumatica ([place])   Stroke ([place])   Residual trouble reading and writing  Syncope   Thyroid nodule   TIA (transient ischemic attack) 12/2017  Type 2 diabetes mellitus ([place])  Past Surgical History: Past Surgical History: Procedure Laterality Date  APPENDECTOMY    as child  BACK SURGERY    2000 IDET SPINAL PROC  CHOLECYSTECTOMY N/A 04/07/2016  Procedure: CHOLECYSTECTOMY;  Surgeon: Aviva Signs, MD;  Location: [facility];  Service: General;  Laterality: N/A;  [place]  COLONOSCOPY N/A 01/07/2018   Procedure: COLONOSCOPY;  Surgeon: Rogene Houston, MD;  Location: [facility];  Service: Endoscopy;  Laterality: N/A;  1:00  CRYOTHERAPY    EYE SURGERY    right cataract with lens implant  FEMUR SURGERY    RT LEG        1953  HAND SURGERY    RIGHT  HERNIA REPAIR    1-right inguinal, 3- left inguinal  IRRIGATION AND DEBRIDEMENT KNEE Left 07/09/2015  Procedure: LEFT KNEE IRRIGATION AND DEBRIDEMENT WOUND CLOSURE;  Surgeon: Gaynelle Arabian, MD;  Location: [facility];  Service: Orthopedics;  Laterality: Left;  JOINT REPLACEMENT  02/28/2016  lt knee revision  KNEE DEBRIDEMENT    2016   LEFT  MENISCUS REPAIR    LEFT  POLYPECTOMY  01/07/2018  Procedure: POLYPECTOMY;  Surgeon: Rogene Houston, MD;  Location: [facility];  Service: Endoscopy;;  colon   SHOULDER OPEN ROTATOR CUFF REPAIR    RT SHOULDER  SPINAL FUSION    SPINAL FUSION    TONSILLECTOMY    TOTAL KNEE ARTHROPLASTY Left  05/28/2015  Procedure: LEFT TOTAL KNEE ARTHROPLASTY;  Surgeon: Gaynelle Arabian, MD;  Location: [facility];  Service: Orthopedics;  Laterality: Left;  TOTAL KNEE REVISION Left 02/28/2016  Procedure: LEFT TOTAL KNEE REVISION;  Surgeon: Leandrew Koyanagi, MD;  Location: [facility];  Service: Orthopedics;  Laterality: Left;  TRIGGER FINGER RELEASE    RT HAND   1990S  UVULECTOMY  2004  TURBINATE,TONSIL,ADENOID HPI: Jared Bridges is a 88 y.o. male with medical history significant of hypertension, hyperlipidemia, T2DM, prostate cancer with osseous metastasis, BPH, hearing impairment, diabetic polyneuropathy who presents to the emergency department via EMS from home due to confusion and weakness.  Patient was unable to provide history, history was obtained from ED physician and ED medical record.  Per report, daughter activated EMS stating that patient has progressively become weaker and has been retaining urine. Chest x-ray showed no acute chest findings.  No data recorded  Recommendations for follow up therapy are one component of a multi-disciplinary discharge planning  process, led by the attending physician.  Recommendations may be updated based on patient status, additional functional criteria and insurance authorization. Assessment / Plan / Recommendation   06/23/2022   8:00 AM Clinical Impressions Clinical Impression Pt presents with mild pharyngeal dysphagia characterized by penetration of thin liquids with a straw; one episode (with pill administration) penetrates fell slightly below the cords but with cued cough were expelled from the airway. Pt presents with diminished pharyngeal stripping wave and valleculae and pyriform residue across consistencies, however note increased residue with NTL. Pt with decreased laryngeal vestibule closure when drinking from a straw. Note baseline congested cough a couple of times throughout study, however, no penetrates/aspirates present at the time. Recommend upgrade Pt's diet to mechanical soft/D3 and thin liquids with strategies re: NO STRAWS, ensure Pt is sitting upright and alert for all PO, occasional throat clears/cough and repeat dry swallows throughout meal. Recommend crush meds in puree or administer whole in puree. ST will continue to follow acutely and will benefit from ST dysphagia therapy f/u at discharge location. SLP Visit Diagnosis Dysphagia, unspecified (R13.10) Impact on safety and function Mild aspiration risk     06/23/2022   8:00 AM Treatment Recommendations Treatment Recommendations Therapy as outlined in treatment plan below     06/23/2022   8:00 AM Prognosis Prognosis for Safe Diet Advancement Good   06/23/2022   8:00 AM Diet Recommendations SLP Diet Recommendations Dysphagia 3 (Mech soft) solids;Thin liquid Liquid Administration via Cup;No straw Medication Administration Whole meds with puree Compensations Minimize environmental distractions;Slow rate;Small sips/bites;Multiple dry swallows after each bite/sip;Clear throat intermittently     06/23/2022   8:00 AM Other Recommendations Oral Care Recommendations Oral care  BID Follow Up Recommendations Skilled nursing-short term rehab (<3 hours/day)   06/23/2022   8:00 AM Frequency and Duration  Speech Therapy Frequency (ACUTE ONLY) min 2x/week Treatment Duration 1 week     06/23/2022   8:00 AM Oral Phase Oral Phase [facility]    06/23/2022   8:00 AM Pharyngeal Phase Pharyngeal Phase Impaired Pharyngeal- Nectar Teaspoon NT Pharyngeal- Nectar Cup Delayed swallow initiation-pyriform sinuses;Delayed swallow initiation-vallecula;Reduced pharyngeal peristalsis;Pharyngeal residue - valleculae;Pharyngeal residue - pyriform Pharyngeal- Nectar Straw NT Pharyngeal- Thin Teaspoon Delayed swallow initiation-pyriform sinuses;Delayed swallow initiation-vallecula;Reduced pharyngeal peristalsis;Pharyngeal residue - valleculae;Pharyngeal residue - pyriform Pharyngeal- Thin Cup Delayed swallow initiation-pyriform sinuses;Delayed swallow initiation-vallecula;Reduced pharyngeal peristalsis;Pharyngeal residue - valleculae;Pharyngeal residue - pyriform Pharyngeal- Thin Straw Delayed swallow initiation-pyriform sinuses;Delayed swallow initiation-vallecula;Reduced pharyngeal peristalsis;Pharyngeal residue - valleculae;Pharyngeal residue - pyriform;Trace aspiration;Penetration/Aspiration during swallow;Penetration/Apiration after  swallow;Reduced airway/laryngeal closure Pharyngeal Material enters airway, CONTACTS cords and not ejected out Pharyngeal- Puree Reduced pharyngeal peristalsis;Delayed swallow initiation-vallecula;Pharyngeal residue - valleculae Pharyngeal- Mechanical Soft NT Pharyngeal- Regular NT Pharyngeal- Multi-consistency NT Pharyngeal- Pill NT     No data to display    Amelia H. Roddie Mc, CCC-SLP Speech Language Pathologist Wende Bushy 06/23/2022, 8:46 AM                      Scheduled Meds:  atorvastatin  40 mg Oral Daily   enoxaparin (LOVENOX) injection  40 mg Subcutaneous Q24H   finasteride  5 mg Oral Daily   insulin aspart  0-5 Units Subcutaneous QHS   insulin aspart  0-6 Units  Subcutaneous TID WC   pantoprazole sodium  40 mg Oral Daily   Continuous Infusions:  sodium chloride Stopped (06/16/22 0730)   meropenem (MERREM) IV 1 g (06/23/22 1711)     LOS: 7 days    Barton Dubois M.D on 06/23/2022 at 6:04 PM  Go to [email or web address] - for contact info  [facility] - Office  [phone]  If 7PM-7AM, please contact night-coverage [email or web address] 06/23/2022, 6:04 PM

## 2022-06-23 NOTE — Progress Notes (Addendum)
This nurse was called to pt room by familiy with pt c/o SOB. Checked O2 sat, 88. Placed pt on 2L, 90. Increased to 2.5L, pt sat is now 92. MD notified. Will continue to monitor pt.

## 2022-06-23 NOTE — NC FL2 (Signed)
[place] LEVEL OF CARE SCREENING TOOL     IDENTIFICATION  Patient Name: Jared Bridges Birthdate: Jul 13, 1930 Sex: male Admission Date (Current Location): 06/15/2022  [facility] and [place] Number:  [employer] and Address:  [place] [address], [place]      Provider Number: [id number]  Attending Physician Name and Address:  Barton Dubois, MD  Relative Name and Phone Number:       Current Level of Care: Hospital Recommended Level of Care: [place] Prior Approval Number:    Date Approved/Denied:   PASRR Number: [id number]  Discharge Plan: SNF    Current Diagnoses: Patient Active Problem List   Diagnosis Date Noted   Septic shock ([place]) 06/16/2022   Lactic acidosis 06/16/2022   Mixed hyperlipidemia 06/16/2022   Chronic diastolic CHF (congestive heart failure) ([place]) 06/16/2022   GERD (gastroesophageal reflux disease) 06/16/2022   Sepsis due to undetermined organism ([place])    Loss of weight 03/10/2022   History of peptic ulcer disease 09/03/2021   Wheelchair dependence 09/02/2021   Quadriplegia ([place]) 09/02/2021   Sleep disturbance    Acute blood loss anemia    Urinary incontinence    Controlled type 2 diabetes mellitus with hyperglycemia, without long-term current use of insulin ([place])    Central cord syndrome at C7 level of cervical spinal cord ([name] 07/05/2021   Epidural hematoma ([name] 07/02/2021   Lower urinary tract symptoms (LUTS) 10/31/2020   Prostate cancer ([place]) 06/20/2020   Elevated PSA 04/18/2020   Nocturia 04/18/2020   Cobalamin deficiency 02/22/2020   Intractable chronic migraine without aura 02/20/2020   Altered mental status 02/20/2020   Asthenia 02/20/2020   Diabetic peripheral neuropathy ([place]) 02/20/2020   Obstructive sleep apnea of adult 02/20/2020   Medicare annual wellness visit, subsequent 12/07/2019   Depressed mood 12/07/2019   Vitamin D deficiency 10/25/2019   Memory  loss 10/25/2019   Fall 10/25/2019   Nonrheumatic aortic valve stenosis 03/23/2018   Migraine headaches 12/29/2017   TIA (transient ischemic attack) 12/28/2017   Rectal bleeding 11/26/2017   Carpal tunnel syndrome, left 07/22/2017   Acquired trigger finger 07/22/2017   Acute cholecystitis 04/05/2016   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[place]) 02/28/2016   Total knee replacement status 02/28/2016   CVA (cerebral infarction) 12/19/2015   Acute CVA (cerebrovascular accident) ([place]) 12/19/2015   Carotid stenosis 12/19/2015   Postoperative wound dehiscence 07/09/2015   Type 2 diabetes mellitus, uncontrolled 06/29/2015   Cellulitis of knee, left 06/29/2015   Wound infection after surgery 06/29/2015   OA (osteoarthritis) of knee 05/28/2015   BRBPR (bright red blood per rectum) 11/21/2014   Orthostatic hypotension 11/21/2014   Dizziness 11/16/2014   Polymyalgia rheumatica ([place]) 09/12/2014   Urinary tract infection with hematuria 08/16/2014   Leukocytosis 08/16/2014   Anemia, iron deficiency 08/16/2014   DJD (degenerative joint disease) 08/16/2014   Essential hypertension    Type 2 diabetes mellitus ([place])    Cataract, nuclear 04/11/2014   Complaining of back-related symptom 03/15/2014   Arthralgia of hip or thigh 03/15/2014   Gonalgia 10/10/2013   Left knee pain 10/10/2013   1st degree AV block 08/04/2013   Infection of the upper respiratory tract 04/05/2013   Cataract 02/24/2013   Pseudoaphakia 02/24/2013   Episode of syncope 01/11/2013   Cardiac murmur 11/09/2012   Near syncope 11/09/2012   Anxiety 06/18/2012   Clinical depression 06/18/2012   Bleeding gastrointestinal 06/18/2012   Diverticulitis 06/18/2012   Difficulty  hearing 06/18/2012   Adaptive colitis 06/18/2012   Arthritis, degenerative 06/18/2012   Disease of thyroid gland 06/18/2012   Hearing impairment 06/18/2012   Multinodular goiter 01/29/2012   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   Adenoma of large intestine 05/31/2011    Cephalalgia 05/31/2011   Hypercholesterolemia 05/31/2011   Benign prostatic hyperplasia with urinary obstruction 05/31/2011   LBP (low back pain) 05/31/2011   Diabetes mellitus ([place]) 05/31/2011   Essential (primary) hypertension 05/31/2011   Headache(784.0) 05/31/2011   Hyperplasia of prostate 05/31/2011    Orientation RESPIRATION BLADDER Height & Weight     Self, Place  Normal Incontinent, External catheter Weight: 192 lb 7.4 oz (87.3 kg) Height:  '6\' 1"'$  (185.4 cm)  BEHAVIORAL SYMPTOMS/MOOD NEUROLOGICAL BOWEL NUTRITION STATUS      Continent Diet (DYS 3- see D/C summary for updates)  AMBULATORY STATUS COMMUNICATION OF NEEDS Skin   Extensive Assist Verbally Normal                       Personal Care Assistance Level of Assistance  Bathing, Feeding, Dressing Bathing Assistance: Maximum assistance Feeding assistance: Limited assistance Dressing Assistance: Maximum assistance     Functional Limitations Info  Sight, Hearing, Speech Sight Info: Impaired Hearing Info: Impaired Speech Info: Adequate    SPECIAL CARE FACTORS FREQUENCY  PT (By licensed PT), OT (By licensed OT)     PT Frequency: 5 times weekly OT Frequency: 5 times weekly            Contractures Contractures Info: Not present    Additional Factors Info  Code Status, Allergies Code Status Info: FULL Allergies Info: Iodine, Calan (Verapamil), Cymbalta (Duloxetine Hcl), Iodinated Contrast Media, Latex, Lisinopril, Neurontin (Gabapentin), Zoloft (Sertraline Hcl), Valium (Diazepam), Flomax (Tamsulosin Hcl), Glimepiride, Melatonin, Tape           Current Medications (06/23/2022):  This is the current hospital active medication list Current Facility-Administered Medications  Medication Dose Route Frequency Provider Last Rate Last Admin   0.9 %  sodium chloride infusion  250 mL Intravenous Continuous Barton Dubois, MD   Held at 06/16/22 0730   0.9 %  sodium chloride infusion   Intravenous Continuous  Barton Dubois, MD 75 mL/hr at 06/22/22 1539 Infusion Verify at 06/22/22 1539   acetaminophen (TYLENOL) tablet 650 mg  650 mg Oral Q6H PRN Barton Dubois, MD   650 mg at 06/20/22 2156   Or   acetaminophen (TYLENOL) suppository 650 mg  650 mg Rectal Q6H PRN Barton Dubois, MD       atorvastatin (LIPITOR) tablet 40 mg  40 mg Oral Daily Barton Dubois, MD   40 mg at 06/23/22 0902   enoxaparin (LOVENOX) injection 40 mg  40 mg Subcutaneous Q24H Barton Dubois, MD   40 mg at 06/22/22 2125   finasteride (PROSCAR) tablet 5 mg  5 mg Oral Daily Barton Dubois, MD   5 mg at 06/23/22 1638   insulin aspart (novoLOG) injection 0-5 Units  0-5 Units Subcutaneous QHS Barton Dubois, MD       insulin aspart (novoLOG) injection 0-6 Units  0-6 Units Subcutaneous TID WC Barton Dubois, MD       meropenem (MERREM) 1 g in sodium chloride 0.9 % 100 mL IVPB  1 g Intravenous Q8H Barton Dubois, MD 200 mL/hr at 06/23/22 1045 1 g at 06/23/22 1045   pantoprazole sodium (PROTONIX) 40 mg/20 mL oral suspension 40 mg  40 mg Oral Daily Barton Dubois, MD   40 mg at 06/23/22 [phone]  Discharge Medications: Please see discharge summary for a list of discharge medications.  Relevant Imaging Results:  Relevant Lab Results:   Additional Information SSN: [id number], [place]

## 2022-06-23 NOTE — Progress Notes (Signed)
Physical Therapy Treatment Patient Details Name: Jared Bridges MRN: [id number] DOB: 06/21/1930 Today's Date: 06/23/2022   History of Present Illness STPEHEN PETITJEAN is a 88 y.o. male with medical history significant of hypertension, hyperlipidemia, T2DM, prostate cancer with osseous metastasis, BPH, hearing impairment, diabetic polyneuropathy who presents to the emergency department via EMS from home due to confusion and weakness.  Patient was unable to provide history, history was obtained from ED physician and ED medical record.  Per report, daughter activated EMS stating that patient has progressively become weaker and has been retaining urine.    PT Comments    Patient supine in bed upon therapist arrival. Wife and daughter present throughout session. Patient agreeable to therapy session in bed today. Patient assisted to perform bed level therapeutic exercises. Patient requiring assistance for all exercises. Patient resisting PROM of bilateral shoulders into flexion. Patient does have his cervical collar in his room but it was not utilized today. Pillows were used instead.  Patient limited by fatigue requiring encouragement and rest breaks to complete repetitions and assistance to complete full ranges of motion of exercises. Significant weakness limitations in all extremities. Patient on 2.5 L/M O2 throughout session.  Patient would continue to benefit from skilled physical therapy in current environment and next venue to continue return to prior function and increase strength, endurance, balance, coordination, and functional mobility and gait skills.     Recommendations for follow up therapy are one component of a multi-disciplinary discharge planning process, led by the attending physician.  Recommendations may be updated based on patient status, additional functional criteria and insurance authorization.  Follow Up Recommendations  Skilled nursing-short term rehab (<3 hours/day) Can patient  physically be transported by private vehicle: No   Assistance Recommended at Discharge Intermittent Supervision/Assistance  Patient can return home with the following A lot of help with bathing/dressing/bathroom;A lot of help with walking and/or transfers;Help with stairs or ramp for entrance;Assistance with cooking/housework   Equipment Recommendations  None recommended by PT    Recommendations for Other Services       Precautions / Restrictions Precautions Precautions: Fall Restrictions Weight Bearing Restrictions: No     Mobility  Bed Mobility Overal bed mobility: Needs Assistance Bed Mobility: Rolling Rolling: Max assist         General bed mobility comments: max for boosting up in bed in Trendelenburg position    Transfers      Ambulation/Gait       Stairs       Wheelchair Mobility    Modified Rankin (Stroke Patients Only)       Balance          Cognition Arousal/Alertness: Awake/alert Behavior During Therapy: WFL for tasks assessed/performed Overall Cognitive Status: Within Functional Limits for tasks assessed        Exercises General Exercises - Upper Extremity Shoulder Flexion: PROM, Right, Left, 5 reps (self limited patient resisting PROM greater than 15 degrees flexion due to fear) General Exercises - Lower Extremity Short Arc Quad: AAROM, Strengthening, Both, 10 reps, Supine Heel Slides: AAROM, Strengthening, Both, 10 reps, Supine Hip ABduction/ADduction: AAROM, Strengthening, Both, 10 reps, Supine Toe Raises: AAROM, Strengthening, Both, 10 reps, Supine Heel Raises: AAROM, Strengthening, Both, 10 reps, Supine    General Comments        Pertinent Vitals/Pain Pain Assessment Pain Assessment: No/denies pain    Home Living           Prior Function  PT Goals (current goals can now be found in the care plan section) Acute Rehab PT Goals Patient Stated Goal: get better PT Goal Formulation: With  patient/family Time For Goal Achievement: 07/04/22 Potential to Achieve Goals: Good Progress towards PT goals: Progressing toward goals    Frequency    Min 3X/week      PT Plan Current plan remains appropriate       AM-PAC PT "6 Clicks" Mobility   Outcome Measure  Help needed turning from your back to your side while in a flat bed without using bedrails?: A Lot Help needed moving from lying on your back to sitting on the side of a flat bed without using bedrails?: A Lot Help needed moving to and from a bed to a chair (including a wheelchair)?: Total Help needed standing up from a chair using your arms (e.g., wheelchair or bedside chair)?: A Lot Help needed to walk in hospital room?: Total Help needed climbing 3-5 steps with a railing? : Total 6 Click Score: 9    End of Session   Activity Tolerance: Patient limited by fatigue Patient left: in bed;with call bell/phone within reach;with family/visitor present Nurse Communication: Mobility status PT Visit Diagnosis: Unsteadiness on feet (R26.81);Other abnormalities of gait and mobility (R26.89);Muscle weakness (generalized) (M62.81)     Time: 2694-8546 PT Time Calculation (min) (ACUTE ONLY): 24 min  Charges:  $Therapeutic Exercise: 8-22 mins $Therapeutic Activity: 8-22 mins                     Floria Raveling. Hartnett-Rands, MS, PT Per [place] [phone]  Pamala Hurry  Hartnett-Rands 06/23/2022, 2:23 PM

## 2022-06-23 NOTE — Evaluation (Signed)
Modified Barium Swallow Progress Note  Patient Details  Name: LESLIE LANGILLE MRN: [id number] Date of Birth: Feb 22, 1930  Today's Date: 06/23/2022  Modified Barium Swallow completed.  Full report located under Chart Review in the Imaging Section.  Brief recommendations include the following:  Clinical Impression  Pt presents with mild pharyngeal dysphagia characterized by penetration of thin liquids with a straw; one episode (with pill administration) penetrates fell slightly below the cords but with cued cough were expelled from the airway. Pt presents with diminished pharyngeal stripping wave and valleculae and pyriform residue across consistencies, however note increased residue with NTL. Pt with decreased laryngeal vestibule closure when drinking from a straw. Note baseline congested cough a couple of times throughout study, however, no penetrates/aspirates present at the time. Recommend upgrade Pt's diet to mechanical soft/D3 and thin liquids with strategies re: NO STRAWS, ensure Pt is sitting upright and alert for all PO, occasional throat clears/cough and repeat dry swallows throughout meal. Recommend crush meds in puree or administer whole in puree. ST will continue to follow acutely and will benefit from ST dysphagia therapy f/u at discharge location.   Swallow Evaluation Recommendations       SLP Diet Recommendations: Dysphagia 3 (Mech soft) solids;Thin liquid   Liquid Administration via: Cup;No straw   Medication Administration: Whole meds with puree   Supervision: Full assist for feeding;Full supervision/cueing for compensatory strategies   Compensations: Minimize environmental distractions;Slow rate;Small sips/bites;Multiple dry swallows after each bite/sip;Clear throat intermittently       Oral Care Recommendations: Oral care BID      Kate Larock H. Roddie Mc, CCC-SLP Speech Language Pathologist   Wende Bushy 06/23/2022,8:44 AM

## 2022-06-24 DIAGNOSIS — D649 Anemia, unspecified: Secondary | ICD-10-CM

## 2022-06-24 LAB — GLUCOSE, CAPILLARY
Glucose-Capillary: 117 mg/dL — ABNORMAL HIGH (ref 70–99)
Glucose-Capillary: 131 mg/dL — ABNORMAL HIGH (ref 70–99)
Glucose-Capillary: 111 mg/dL — ABNORMAL HIGH (ref 70–99)

## 2022-06-24 LAB — BASIC METABOLIC PANEL
Anion gap: 6 (ref 5–15)
BUN: 8 mg/dL (ref 8–23)
CO2: 28 mmol/L (ref 22–32)
Calcium: 7.9 mg/dL — ABNORMAL LOW (ref 8.9–10.3)
Chloride: 104 mmol/L (ref 98–111)
Creatinine, Ser: 0.69 mg/dL (ref 0.61–1.24)
GFR, Estimated: >60 mL/min (ref >60)
Glucose, Bld: 110 mg/dL — ABNORMAL HIGH (ref 70–99)
Potassium: 4 mmol/L (ref 3.5–5.1)
Sodium: 138 mmol/L (ref 135–145)

## 2022-06-24 LAB — CBC
HCT: 35.4 % — ABNORMAL LOW (ref 39.0–52.0)
Hemoglobin: 11.4 g/dL — ABNORMAL LOW (ref 13.0–17.0)
MCH: 28.4 pg (ref 26.0–34.0)
MCHC: 32.2 g/dL (ref 30.0–36.0)
MCV: 88.3 fL (ref 80.0–100.0)
Platelets: 250 10*3/uL (ref 150–400)
RBC: 4.01 MIL/uL — ABNORMAL LOW (ref 4.22–5.81)
RDW: 14.7 % (ref 11.5–15.5)
WBC: 6.2 10*3/uL (ref 4.0–10.5)
nRBC: 0 % (ref 0.0–0.2)

## 2022-06-24 MED ORDER — FUROSEMIDE 20 MG PO TABS: 20.0000 mg | ORAL_TABLET | ORAL | 0 refills | Status: DC

## 2022-06-24 MED ORDER — PANTOPRAZOLE SODIUM 20 MG PO TBEC: 40.0000 mg | DELAYED_RELEASE_TABLET | Freq: Every day | ORAL | Status: DC

## 2022-06-24 MED ORDER — ZOLPIDEM TARTRATE 5 MG PO TABS: 5.0000 mg | ORAL_TABLET | Freq: Every evening | ORAL | 0 refills | Status: DC | PRN

## 2022-06-24 MED ORDER — TOPIRAMATE 50 MG PO TABS: 25.0000 mg | ORAL_TABLET | Freq: Two times a day (BID) | ORAL | Status: DC

## 2022-06-24 MED ORDER — SULFAMETHOXAZOLE-TRIMETHOPRIM 800-160 MG PO TABS: 1.0000 | ORAL_TABLET | Freq: Two times a day (BID) | ORAL | 0 refills | Status: AC

## 2022-06-24 NOTE — Care Management Important Message (Signed)
Important Message  Patient Details  Name: Jared Bridges MRN: [id number] Date of Birth: 22-Jun-1930   Medicare Important Message Given:  Yes     [name] 06/24/2022, 10:38 AM

## 2022-06-24 NOTE — Progress Notes (Signed)
Palliative:  HPI: 88 y.o. male  with past medical history of prostate cancer with bone mets (follows at [facility] and on [place]), h/o fall with C7 SCI and stay in [facility] Aug 2022, wheelchair bound, stroke, migraines, HTN, HLD, orthostatic hypotension, polymalgia rheumatica, diabetes admitted on 06/15/2022 with confusion and weakness with UTI sepsis. Continues with slow improvements.   I met today again at Jared Bridges bedside. He is much more alert and awake today. He is interactive and able to hold conversation much better. He tells me that his daughter, Jared Bridges is signing papers at [facility] - he has plans to pursue rehab there. Wife, Jared Bridges, is at bedside. Jared Bridges does tell me "I'm depressed." I talked with him about why he is depressed and we reviewed that he has been through a very difficult and serious infection. I explained that if he feels weaker and less energy that this is normal. We discussed that he may begin to feel better with some time. Jared Bridges joined [name] at bedside. We reflected on transition to rehab and that this may be helpful as well. Jared Bridges shares that she has located her father's Living Will but this is 59 years old and they plan to review this together. We briefly discussed code status and Jared Bridges shares that they have discussed and elected full code at this time. Jared Bridges does not contribute to this part of the conversation. He begins to become sleepy. I encouraged ongoing conversation regarding goals of care.   All questions/concerns addressed. Emotional support provided.   Exam: Alert, oriented. Less delayed responses. Flat affect. No distress. Breathing regular, unlabored. Abd flat. Generalized weakness and fatigue.   Plan: - Full code - Transition to SNF rehab - Recommend outpatient palliative to follow (patient and family onboard with continued support)  25 min  Vinie Sill, NP Palliative Medicine Team Pager [phone] (Please see [email or web address] for schedule) Team Phone  [phone]    Greater than 50%  of this time was spent counseling and coordinating care related to the above assessment and plan

## 2022-06-24 NOTE — Discharge Summary (Signed)
Physician Discharge Summary   Patient: Jared Bridges MRN: [id number] DOB: [birth year]  Admit date:     06/15/2022  Discharge date: 06/24/22  Discharge Physician: Barton Dubois   PCP: [name], MD   Recommendations at discharge:  Repeat basic metabolic panel to follow electrolytes and renal function Repeat CBC to follow hemoglobin and WBCs trend/stability Outpatient follow-up with palliative care to discuss goals of care and advance care planning.   Discharge Diagnoses: Active Problems:   Urinary tract infection with hematuria   Encephalopathy   Leukocytosis   Essential hypertension   Benign prostatic hyperplasia with urinary obstruction   Septic shock ([name]   Lactic acidosis   Mixed hyperlipidemia   Chronic diastolic CHF (congestive heart failure) ([name]   GERD (gastroesophageal reflux disease)   Sepsis due to undetermined organism ([place])   Normochromic normocytic anemia  Brief Narrative:  Brief Summary:-  88 y.o. male with medical history significant of hypertension, hyperlipidemia, T2DM, prostate cancer with osseous metastasis, BPH, hearing impairment, diabetic polyneuropathy admitted on 06/16/2022 with septic shock due to Morganella morganii bacteremia presumably from urinary source    -Assessment and Plan:    A/p 1) Morganella Morgagni septic shock from presumed urinary source--POA and aspiration pneumonia. -Patient met sepsis criteria on admission -Lactic acidosis and leukocytosis noted WBC 12.6 >>>39.7>> 27.6>>> 7.7 Lactic acid 5.4 >>2.0>>> within normal limits. -Required Levophed and vasopressin for pressure support as well as IV fluids until 06/18/2022; patient has remained off pressure support and demonstrating stable vital signs. -Will transition antibiotics to oral Bactrim and assess tolerance. -MBS successfully performed and patient's diet upgraded to dysphagia 3 with thin liquids.   2) history of urinary retention/BPH with LUTS--- unable to void, given  hemodynamic instability and the need to monitor urine output accurately Foley catheter/coud catheter placed on 06/16/2022 with 400 mL of urine removed -Now that his blood pressure has improved and is no longer requiring pressors Proscar will be resume. -Continue to closely follow urine output. -Successful voiding trial on 06/22/2022; continue close monitoring to determine the need for Foley catheter re-insertion; outpatient follow up    3) social/ethics--- plan of care discussed with patient's daughter and wife -Patient DNR/DNI CODE STATUS has been revoked after discussing with patient's daughter.  She would like to see how the patient's continue to to work/respond prior to make any final decisions.  She will continue talking with patient itself and the rest of the family to come up with a living will. -Palliative care consult appreciated, will follow further recommendations. -Overall prognosis is guarded. -Patient is now full code. -Planning for a skilled nursing facility at discharge for rehabilitation.   4)HFpEF--- prior echo with EF of 65% with grade 1 diastolic dysfunction -Given new onset of shortness of breath chest x-ray repeated demonstrating bilateral atelectasis with pleural effusion. -Probably mild exacerbation of diastolic dysfunction. -IV Lasix x1 given on 06/23/22, planning every other day lasix 29m at discharge. -no crackles and improved air movement.  -Continue to wean off oxygen supplementation -Follow daily weights, adequate hydration and adequate hydration.   5) acute on chronic anemia--- patient with anemia of chronic disease. -Decrease in his hemoglobin due to hemodilution due to IV fluids for sepsis. -No overt bleeding appreciated -Continue to follow hemoglobin trend intermittently and transfuse as needed. -Most recent hemoglobin level 8.9; repeat CBC in AM.   6)DM2--- prior A1c 6.4 reflecting good diabetic control PTA -Continue sliding scale insulin and continue  close monitoring of patient's CBGs to further adjust management  as needed.   7) dysphagia--- speech pathologist assistance and recommendations appreciated. -Status post MBS with upgrading his diet to dysphagia 3 and thin liquids. -Appreciate speech therapy assistance and recommendation.[name] Kitchen  8) depression -Resume Lexapro -Continue supportive care and increase motivation.  Consultants: Palliative care Procedures performed: See below for x-ray reports. Disposition: Skilled nursing facility for short-term rehabilitation. Diet recommendation: Heart healthy diet; dysphagia 3 diet with thin liquids.   DISCHARGE MEDICATION: Allergies as of 06/24/2022       Reactions   Iodine Hives, Itching, Other (See Comments)   very allergic(per daughter), can pre-med with benadryl   Calan [verapamil] Other (See Comments)   Weakness   Cymbalta [duloxetine Hcl] Other (See Comments)   Makes patient have jerking motions.   Iodinated Contrast Media Hives, Itching, Other (See Comments)   Can pre-med with benadryl   Latex Other (See Comments)   Redness iritation   Lisinopril Other (See Comments)   Makes patient have jerking motions.   Neurontin [gabapentin] Other (See Comments)   Makes patient have jerking motions.   Zoloft [sertraline Hcl] Diarrhea, Other (See Comments)   Makes patient have jerking motions.   Valium [diazepam] Other (See Comments)   "Did not work"   Herbalist Hcl] Other (See Comments)   Dizziness   Glimepiride Other (See Comments)   "Did not work"   Melatonin Nausea Only   Tape Other (See Comments)   Skin irritation        Medication List     STOP taking these medications    alfuzosin 10 MG 24 hr tablet Commonly known as: UROXATRAL   enzalutamide 80 MG tablet Commonly known as: XTANDI   Otezla 30 MG Tabs Generic drug: Apremilast       TAKE these medications    acetaminophen 325 MG tablet Commonly known as: TYLENOL Take 2 tablets (650 mg total) by mouth  3 (three) times daily.   atorvastatin 40 MG tablet Commonly known as: LIPITOR Take 1 tablet (40 mg total) by mouth daily.   cyanocobalamin 500 MCG tablet Commonly known as: CYANOCOBALAMIN Take 1,000 mcg by mouth daily.   diclofenac sodium 1 % Gel Commonly known as: VOLTAREN Apply 2 g topically daily as needed (for pain).   escitalopram 20 MG tablet Commonly known as: LEXAPRO Take 20 mg by mouth daily.   ferrous sulfate 325 (65 FE) MG tablet Take 325 mg by mouth 3 (three) times a week. Mon, wed, fri   finasteride 5 MG tablet Commonly known as: PROSCAR Take 1 tablet (5 mg total) by mouth daily.   furosemide 20 MG tablet Commonly known as: Lasix Take 1 tablet (20 mg total) by mouth every other day.   OVER THE COUNTER MEDICATION Take 400 mg by mouth in the morning and at bedtime. Calcium 425m twice a day   pantoprazole 20 MG tablet Commonly known as: PROTONIX Take 2 tablets (40 mg total) by mouth daily. What changed: how much to take   pregabalin 75 MG capsule Commonly known as: LYRICA Take 75 mg by mouth 2 (two) times daily.   sulfamethoxazole-trimethoprim 800-160 MG tablet Commonly known as: BACTRIM DS Take 1 tablet by mouth every 12 (twelve) hours for 4 days.   topiramate 50 MG tablet Commonly known as: TOPAMAX Take 0.5 tablets (25 mg total) by mouth 2 (two) times daily. What changed: how much to take   Vitamin D3 250 MCG (10000 UT) Tabs Take 1,000 Units by mouth daily.   zolpidem 5 MG tablet Commonly  known as: AMBIEN Take 1 tablet (5 mg total) by mouth at bedtime as needed for sleep. What changed:  when to take this reasons to take this        Follow-up Information     [name], MD. Schedule an appointment as soon as possible for a visit in 10 day(s).   Specialty: Cardiology Why: after discharge from SNF Contact information: [place] [address] [phone]                Discharge Exam: Filed Weights    06/22/22 0500 06/23/22 0420 06/24/22 0500  Weight: 86.4 kg 87.3 kg 82.1 kg   General exam: Alert, awake, oriented x 3; reports feeling down, and short of breath.  Currently requiring again 2 L nasal cannula supplementation to keep saturation above 89%. Respiratory system: No wheezing, no using accessory muscles; positive fine crackles at the bases bilaterally. Cardiovascular system: Rate controlled, no rubs, no gallops, no JVD.  Positive systolic murmur. Gastrointestinal system: Abdomen is nondistended, soft and nontender. No organomegaly or masses felt. Normal bowel sounds heard. Central nervous system: Alert and oriented. No new focal neurological deficits. Extremities: No cyanosis or clubbing. Skin: No petechiae. Psychiatry: Mood & affect appropriate.   Condition at discharge: Stable and improved.  The results of significant diagnostics from this hospitalization (including imaging, microbiology, ancillary and laboratory) are listed below for reference.   Imaging Studies: DG CHEST PORT 1 VIEW  Result Date: 06/23/2022 CLINICAL DATA:  Worsening dyspnea EXAM: PORTABLE CHEST 1 VIEW COMPARISON:  Radiographs 06/17/2022 FINDINGS: New layering small bilateral pleural effusions and associated atelectasis. Stable cardiomediastinal silhouette. Aortic calcifications. Small curvilinear densities projecting over the expected location of the stomach likely due to residual barium within the stomach from modified barium swallow study earlier today. Surgical clips left upper quadrant. The previous left subclavian SVC has been removed. IMPRESSION: New layering bilateral pleural effusions with increased bilateral lower lung atelectasis. Electronically Signed   By: Placido Sou M.D.   On: 06/23/2022 13:01   DG Swallowing Func-Speech Pathology  Result Date: 06/23/2022 Table formatting from the original result was not included. Images from the original result were not included. Objective Swallowing Evaluation:  Type of Study: MBS-Modified Barium Swallow Study  Patient Details Name: Jared Bridges MRN: [id number] Date of Birth: 1930/03/16 Today's Date: 06/23/2022 Time: SLP Start Time (ACUTE ONLY): 0803 -SLP Stop Time (ACUTE ONLY): 0825 SLP Time Calculation (min) (ACUTE ONLY): 22 min Past Medical History: Past Medical History: Diagnosis Date  Anemia, iron deficiency   Chronic headaches   Migraines  Chronic kidney disease   Resolved  Depression   Diverticulitis   Essential hypertension   GI bleed   Dr. Laural Golden - 1998  Hypercholesteremia   Orthostatic hypotension   Peptic ulcer disease   Peripheral neuropathy   Polymyalgia rheumatica ([place])   Stroke ([place])   Residual trouble reading and writing  Syncope   Thyroid nodule   TIA (transient ischemic attack) 12/2017  Type 2 diabetes mellitus ([place])  Past Surgical History: Past Surgical History: Procedure Laterality Date  APPENDECTOMY    as child  BACK SURGERY    2000 IDET SPINAL PROC  CHOLECYSTECTOMY N/A 04/07/2016  Procedure: CHOLECYSTECTOMY;  Surgeon: Aviva Signs, MD;  Location: [facility];  Service: General;  Laterality: N/A;  [place]  COLONOSCOPY N/A 01/07/2018  Procedure: COLONOSCOPY;  Surgeon: Rogene Houston, MD;  Location: [facility];  Service: Endoscopy;  Laterality: N/A;  1:00  CRYOTHERAPY    EYE SURGERY    right cataract with lens implant  FEMUR SURGERY    RT LEG        1953  HAND SURGERY    RIGHT  HERNIA REPAIR    1-right inguinal, 3- left inguinal  IRRIGATION AND DEBRIDEMENT KNEE Left 07/09/2015  Procedure: LEFT KNEE IRRIGATION AND DEBRIDEMENT WOUND CLOSURE;  Surgeon: Gaynelle Arabian, MD;  Location: [facility];  Service: Orthopedics;  Laterality: Left;  JOINT REPLACEMENT  02/28/2016  lt knee revision  KNEE DEBRIDEMENT    2016   LEFT  MENISCUS REPAIR    LEFT  POLYPECTOMY  01/07/2018  Procedure: POLYPECTOMY;  Surgeon: Rogene Houston, MD;  [facility];  Service: Endoscopy;;  colon   SHOULDER OPEN ROTATOR CUFF REPAIR    RT SHOULDER  SPINAL  FUSION    SPINAL FUSION    TONSILLECTOMY    TOTAL KNEE ARTHROPLASTY Left 05/28/2015  Procedure: LEFT TOTAL KNEE ARTHROPLASTY;  Surgeon: Gaynelle Arabian, MD;  Location: [facility];  Service: Orthopedics;  Laterality: Left;  TOTAL KNEE REVISION Left 02/28/2016  Procedure: LEFT TOTAL KNEE REVISION;  Surgeon: Leandrew Koyanagi, MD;  Location: [facility];  Service: Orthopedics;  Laterality: Left;  TRIGGER FINGER RELEASE    RT HAND   1990S  UVULECTOMY  2004  TURBINATE,TONSIL,ADENOID HPI: GURPREET MIKHAIL is a 88 y.o. male with medical history significant of hypertension, hyperlipidemia, T2DM, prostate cancer with osseous metastasis, BPH, hearing impairment, diabetic polyneuropathy who presents to the emergency department via EMS from home due to confusion and weakness.  Patient was unable to provide history, history was obtained from ED physician and ED medical record.  Per report, daughter activated EMS stating that patient has progressively become weaker and has been retaining urine. Chest x-ray showed no acute chest findings.  No data recorded  Recommendations for follow up therapy are one component of a multi-disciplinary discharge planning process, led by the attending physician.  Recommendations may be updated based on patient status, additional functional criteria and insurance authorization. Assessment / Plan / Recommendation   06/23/2022   8:00 AM Clinical Impressions Clinical Impression Pt presents with mild pharyngeal dysphagia characterized by penetration of thin liquids with a straw; one episode (with pill administration) penetrates fell slightly below the cords but with cued cough were expelled from the airway. Pt presents with diminished pharyngeal stripping wave and valleculae and pyriform residue across consistencies, however note increased residue with NTL. Pt with decreased laryngeal vestibule closure when drinking from a straw. Note baseline congested cough a couple of times throughout study, however, no  penetrates/aspirates present at the time. Recommend upgrade Pt's diet to mechanical soft/D3 and thin liquids with strategies re: NO STRAWS, ensure Pt is sitting upright and alert for all PO, occasional throat clears/cough and repeat dry swallows throughout meal. Recommend crush meds in puree or administer whole in puree. ST will continue to follow acutely and will benefit from ST dysphagia therapy f/u at discharge location. SLP Visit Diagnosis Dysphagia, unspecified (R13.10) Impact on safety and function Mild aspiration risk     06/23/2022   8:00 AM Treatment Recommendations Treatment Recommendations Therapy as outlined in treatment plan below     06/23/2022   8:00 AM Prognosis Prognosis for Safe Diet Advancement Good   06/23/2022   8:00 AM Diet Recommendations SLP Diet Recommendations Dysphagia 3 (Mech soft) solids;Thin liquid Liquid Administration via Cup;No straw Medication Administration Whole meds with puree Compensations Minimize environmental distractions;Slow  rate;Small sips/bites;Multiple dry swallows after each bite/sip;Clear throat intermittently     06/23/2022   8:00 AM Other Recommendations Oral Care Recommendations Oral care BID Follow Up Recommendations Skilled nursing-short term rehab (<3 hours/day)   06/23/2022   8:00 AM Frequency and Duration  Speech Therapy Frequency (ACUTE ONLY) min 2x/week Treatment Duration 1 week     06/23/2022   8:00 AM Oral Phase Oral Phase [facility]    06/23/2022   8:00 AM Pharyngeal Phase Pharyngeal Phase Impaired Pharyngeal- Nectar [place] NT Pharyngeal- Nectar Cup Delayed swallow initiation-pyriform sinuses;Delayed swallow initiation-vallecula;Reduced pharyngeal peristalsis;Pharyngeal residue - valleculae;Pharyngeal residue - pyriform Pharyngeal- Nectar Straw NT Pharyngeal- Thin [place] Delayed swallow initiation-pyriform sinuses;Delayed swallow initiation-vallecula;Reduced pharyngeal peristalsis;Pharyngeal residue - valleculae;Pharyngeal residue - pyriform Pharyngeal- Thin Cup  Delayed swallow initiation-pyriform sinuses;Delayed swallow initiation-vallecula;Reduced pharyngeal peristalsis;Pharyngeal residue - valleculae;Pharyngeal residue - pyriform Pharyngeal- Thin Straw Delayed swallow initiation-pyriform sinuses;Delayed swallow initiation-vallecula;Reduced pharyngeal peristalsis;Pharyngeal residue - valleculae;Pharyngeal residue - pyriform;Trace aspiration;Penetration/Aspiration during swallow;Penetration/Apiration after swallow;Reduced airway/laryngeal closure Pharyngeal Material enters airway, CONTACTS cords and not ejected out Pharyngeal- Puree Reduced pharyngeal peristalsis;Delayed swallow initiation-vallecula;Pharyngeal residue - valleculae Pharyngeal- Mechanical Soft NT Pharyngeal- Regular NT Pharyngeal- Multi-consistency NT Pharyngeal- Pill NT     No data to display    Amelia H. Roddie Mc, CCC-SLP Speech Language Pathologist Wende Bushy 06/23/2022, 8:46 AM                     DG CHEST PORT 1 VIEW  Result Date: 06/17/2022 CLINICAL DATA:  Dyspnea EXAM: PORTABLE CHEST 1 VIEW COMPARISON:  Chest radiograph from one day prior. FINDINGS: Right subclavian central venous catheter terminates over the cavoatrial junction. Stable cardiomediastinal silhouette with normal heart size. No pneumothorax. No pleural effusion. Mild bibasilar streaky opacities, similar, favor atelectasis. No pulmonary edema. IMPRESSION: Stable mild bibasilar streaky opacities, favor atelectasis. Electronically Signed   By: Ilona Sorrel M.D.   On: 06/17/2022 09:55   DG CHEST PORT 1 VIEW  Result Date: 06/16/2022 CLINICAL DATA:  Post line placement EXAM: PORTABLE CHEST 1 VIEW COMPARISON:  06/15/2022 FINDINGS: Right subclavian line tip overlies the cavoatrial junction. No pneumothorax. Lung aeration is similar with no new consolidation or edema. No pleural effusion. Stable cardiomediastinal contours. IMPRESSION: Right subclavian line tip overlies cavoatrial junction. No pneumothorax. Electronically Signed    By: Macy Mis M.D.   On: 06/16/2022 12:13   CT Head Wo Contrast  Result Date: 06/16/2022 CLINICAL DATA:  Mental status change, unknown cause Weakness. EXAM: CT HEAD WITHOUT CONTRAST TECHNIQUE: Contiguous axial images were obtained from the base of the skull through the vertex without intravenous contrast. RADIATION DOSE REDUCTION: This exam was performed according to the departmental dose-optimization program which includes automated exposure control, adjustment of the mA and/or kV according to patient size and/or use of iterative reconstruction technique. COMPARISON:  Head CT 07/01/2021 FINDINGS: Brain: No intracranial hemorrhage, mass effect, or midline shift. No hydrocephalus. Stable degree of atrophy and chronic small vessel ischemic change. The basilar cisterns are patent. No evidence of territorial infarct or acute ischemia. No extra-axial or intracranial fluid collection. Vascular: Atherosclerosis of skullbase vasculature without hyperdense vessel or abnormal calcification. Skull: No fracture or focal lesion. Sinuses/Orbits: Paranasal sinuses and mastoid air cells are clear. The visualized orbits are unremarkable. Bilateral cataract resection. Chronic defect of the right lamina papyracea. Other: None. IMPRESSION: 1. No acute intracranial abnormality. 2. Stable atrophy and chronic small vessel ischemic change. Electronically Signed   By: Keith Rake M.D.   On: 06/16/2022 01:00  DG Chest Portable 1 View  Result Date: 06/15/2022 CLINICAL DATA:  Shortness of breath.  Weakness. EXAM: PORTABLE CHEST 1 VIEW COMPARISON:  Chest CTA 04/27/2022, chest radiograph 07/06/2021 FINDINGS: The heart is normal in size. Stable mediastinal tortuous [place]. No acute airspace disease. No pulmonary edema, pleural effusion, or pneumothorax. The patient's chin partially obscures the medial apices. Acute osseous findings. IMPRESSION: No acute chest findings. Electronically Signed   By: Keith Rake M.D.   On:  06/15/2022 22:56    Microbiology: Results for orders placed or performed during the hospital encounter of 06/15/22  Urine Culture     Status: Abnormal   Collection Time: 06/15/22  9:37 PM   Specimen: In/Out Cath Urine  Result Value Ref Range Status   Specimen Description   Final    IN/OUT CATH URINE Performed at [facility], [address] [place], [place] [address]    Special Requests   Final    NONE Performed at [facility], [address], [place], [place] [address]    Culture MULTIPLE SPECIES PRESENT, SUGGEST RECOLLECTION (A)  Final   Report Status 06/17/2022 FINAL  Final  Blood Culture (routine x 2)     Status: Abnormal   Collection Time: 06/16/22  3:38 AM   Specimen: BLOOD RIGHT ARM  Result Value Ref Range Status   Specimen Description   Final    BLOOD RIGHT ARM Performed at [facility], [address] [place], [place] [address]    Special Requests   Final    BOTTLES DRAWN AEROBIC AND ANAEROBIC Blood Culture adequate volume Performed at [address] [place], [place] [address]    Culture  Setup Time   Final    GRAM NEGATIVE RODS BOTTLES DRAWN AEROBIC AND ANAEROBIC Gram Stain Report Called to,Read Back By and Verified With: Quentin Angst RN [phone] K FORSYTH CRITICAL RESULT CALLED TO, READ BACK BY AND VERIFIED WITH: RN ROBERT MAYNARD 06/17/22_0 :26 BY TW Performed at [facility], [place] [address], [place], [place] [address]    Culture [facility] MORGANII (A)  Final   Report Status 06/18/2022 FINAL  Final   Organism ID, Bacteria MORGANELLA MORGANII  Final      Susceptibility   Morganella morganii - MIC*    AMPICILLIN >=32 RESISTANT Resistant     CEFAZOLIN >=64 RESISTANT Resistant     CEFTAZIDIME 16 INTERMEDIATE Intermediate     CIPROFLOXACIN <=0.25 SENSITIVE Sensitive     GENTAMICIN <=1 SENSITIVE Sensitive     IMIPENEM 4 SENSITIVE Sensitive     TRIMETH/SULFA <=20 SENSITIVE Sensitive     AMPICILLIN/SULBACTAM >=32 RESISTANT Resistant      PIP/TAZO <=4 SENSITIVE Sensitive     * MORGANELLA MORGANII  Blood Culture ID Panel (Reflexed)     Status: Abnormal   Collection Time: 06/16/22  3:38 AM  Result Value Ref Range Status   Enterococcus faecalis NOT DETECTED NOT DETECTED Final   Enterococcus Faecium NOT DETECTED NOT DETECTED Final   Listeria monocytogenes NOT DETECTED NOT DETECTED Final   Staphylococcus species NOT DETECTED NOT DETECTED Final   Staphylococcus aureus (BCID) NOT DETECTED NOT DETECTED Final   Staphylococcus epidermidis NOT DETECTED NOT DETECTED Final   Staphylococcus lugdunensis NOT DETECTED NOT DETECTED Final   Streptococcus species NOT DETECTED NOT DETECTED Final   Streptococcus agalactiae NOT DETECTED NOT DETECTED Final   Streptococcus pneumoniae NOT DETECTED NOT DETECTED Final   Streptococcus pyogenes NOT DETECTED NOT DETECTED Final   A.calcoaceticus-baumannii NOT DETECTED NOT DETECTED Final  Bacteroides fragilis NOT DETECTED NOT DETECTED Final   Enterobacterales DETECTED (A) NOT DETECTED Final    Comment: Enterobacterales represent a large order of gram negative bacteria, not a single organism. Refer to culture for further identification. CRITICAL RESULT CALLED TO, READ BACK BY AND VERIFIED WITH: RN ROBERT MAYNARD 06/17/22_0 :26 BY TW    Enterobacter cloacae complex NOT DETECTED NOT DETECTED Final   Escherichia coli NOT DETECTED NOT DETECTED Final   Klebsiella aerogenes NOT DETECTED NOT DETECTED Final   Klebsiella oxytoca NOT DETECTED NOT DETECTED Final   Klebsiella pneumoniae NOT DETECTED NOT DETECTED Final   Proteus species NOT DETECTED NOT DETECTED Final   Salmonella species NOT DETECTED NOT DETECTED Final   Serratia marcescens NOT DETECTED NOT DETECTED Final   Haemophilus influenzae NOT DETECTED NOT DETECTED Final   Neisseria meningitidis NOT DETECTED NOT DETECTED Final   Pseudomonas aeruginosa NOT DETECTED NOT DETECTED Final   Stenotrophomonas maltophilia NOT DETECTED NOT DETECTED Final   Candida  albicans NOT DETECTED NOT DETECTED Final   Candida auris NOT DETECTED NOT DETECTED Final   Candida glabrata NOT DETECTED NOT DETECTED Final   Candida krusei NOT DETECTED NOT DETECTED Final   Candida parapsilosis NOT DETECTED NOT DETECTED Final   Candida tropicalis NOT DETECTED NOT DETECTED Final   Cryptococcus neoformans/gattii NOT DETECTED NOT DETECTED Final   CTX-M ESBL NOT DETECTED NOT DETECTED Final   Carbapenem resistance IMP NOT DETECTED NOT DETECTED Final   Carbapenem resistance KPC NOT DETECTED NOT DETECTED Final   Carbapenem resistance NDM NOT DETECTED NOT DETECTED Final   Carbapenem resist OXA 48 LIKE NOT DETECTED NOT DETECTED Final   Carbapenem resistance VIM NOT DETECTED NOT DETECTED Final    Comment: Performed at [facility], [address], [place], [place] [address]  Resp Panel by RT-PCR (Flu A&B, Covid) Anterior Nasal Swab     Status: None   Collection Time: 06/16/22  3:48 AM   Specimen: Anterior Nasal Swab  Result Value Ref Range Status   SARS Coronavirus 2 by RT PCR NEGATIVE NEGATIVE Final    Comment: (NOTE) SARS-CoV-2 target nucleic acids are NOT DETECTED.  The SARS-CoV-2 RNA is generally detectable in upper respiratory specimens during the acute phase of infection. The lowest concentration of SARS-CoV-2 viral copies this assay can detect is 138 copies/mL. A negative result does not preclude SARS-Cov-2 infection and should not be used as the sole basis for treatment or other patient management decisions. A negative result may occur with  improper specimen collection/handling, submission of specimen other than nasopharyngeal swab, presence of viral mutation(s) within the areas targeted by this assay, and inadequate number of viral copies(<138 copies/mL). A negative result must be combined with clinical observations, patient history, and epidemiological information. The expected result is Negative.  Fact Sheet for Patients:   EntrepreneurPulse.com.au  Fact Sheet for Healthcare Providers:  IncredibleEmployment.be  This test is no t yet approved or cleared by the [place] FDA and  has been authorized for detection and/or diagnosis of SARS-CoV-2 by FDA under an Emergency Use Authorization (EUA). This EUA will remain  in effect (meaning this test can be used) for the duration of the COVID-19 declaration under Section 564(b)(1) of the Act, 21 U.S.C.section 360bbb-3(b)(1), unless the authorization is terminated  or revoked sooner.       Influenza A by PCR NEGATIVE NEGATIVE Final   Influenza B by PCR NEGATIVE NEGATIVE Final    Comment: (NOTE) The Xpert Xpress SARS-CoV-2/FLU/RSV plus assay is intended as an  aid in the diagnosis of influenza from Nasopharyngeal swab specimens and should not be used as a sole basis for treatment. Nasal washings and aspirates are unacceptable for Xpert Xpress SARS-CoV-2/FLU/RSV testing.  Fact Sheet for Patients: EntrepreneurPulse.com.au  Fact Sheet for Healthcare Providers: IncredibleEmployment.be  This test is not yet approved or cleared by the [place] FDA and has been authorized for detection and/or diagnosis of SARS-CoV-2 by FDA under an Emergency Use Authorization (EUA). This EUA will remain in effect (meaning this test can be used) for the duration of the COVID-19 declaration under Section 564(b)(1) of the Act, 21 U.S.C. section 360bbb-3(b)(1), unless the authorization is terminated or revoked.  Performed at [facility], [address], [place], [place] [address]   Blood Culture (routine x 2)     Status: Abnormal   Collection Time: 06/16/22  4:07 AM   Specimen: BLOOD RIGHT HAND  Result Value Ref Range Status   Specimen Description   Final    BLOOD RIGHT HAND Performed at [facility], [address], [place], [place] [address]    Special Requests   Final    BOTTLES DRAWN AEROBIC  AND ANAEROBIC Blood Culture adequate volume Performed at [facility], [address], [place], [place] [address]    Culture  Setup Time   Final    GRAM NEGATIVE RODS BOTTLES DRAWN AEROBIC AND ANAEROBIC CRITICAL VALUE NOTED.  VALUE IS CONSISTENT WITH PREVIOUSLY REPORTED AND CALLED VALUE. Performed at [facility], [address], [place], [place] [address]    Culture (A)  Final    MORGANELLA MORGANII SUSCEPTIBILITIES PERFORMED ON PREVIOUS CULTURE WITHIN THE LAST 5 DAYS. Performed at [facility], [place] [address] [place], [place] [address]    Report Status 06/18/2022 FINAL  Final  MRSA Next Gen by PCR, Nasal     Status: None   Collection Time: 06/16/22  6:04 AM   Specimen: Nasal Mucosa; Nasal Swab  Result Value Ref Range Status   MRSA by PCR Next Gen NOT DETECTED NOT DETECTED Final    Comment: (NOTE) The GeneXpert MRSA Assay (FDA approved for NASAL specimens only), is one component of a comprehensive MRSA colonization surveillance program. It is not intended to diagnose MRSA infection nor to guide or monitor treatment for MRSA infections. Test performance is not FDA approved in patients less than 69 years old. Performed at [facility], [address], [place],  [address]     Labs: CBC: Recent Labs  Lab 06/18/22 0359 06/21/22 0545 06/24/22 0836  WBC 27.6* 7.7 6.2  HGB 8.5* 8.9* 11.4*  HCT 27.1* 29.0* 35.4*  MCV 91.9 91.8 88.3  PLT 115* 121* 517   Basic Metabolic Panel: Recent Labs  Lab 06/18/22 0359 06/21/22 0545 06/22/22 1040 06/23/22 0530 06/24/22 0836  NA 138 142 137 139 138  K 3.2* 2.6* 2.9* 3.3* 4.0  CL 111 115* 107 106 104  CO2 _0 GLUCOSE 76 85 136* 112* 110*  BUN 34* _1 CREATININE 0.87 0.59* 0.53* 0.52* 0.69  CALCIUM 7.8* 7.8* 7.5* 7.7* 7.9*  MG  --  1.9  --   --   --    Liver Function Tests: Recent Labs  Lab 06/18/22 0359  AST 26  ALT 14  ALKPHOS 58  BILITOT 0.6  PROT 5.0*  ALBUMIN 2.0*   CBG: Recent Labs   Lab 06/23/22 1122 06/23/22 1602 06/23/22 2130 06/24/22 0716 06/24/22 1130  GLUCAP 101* 137* 125* 117* 131*  Discharge time spent: greater than 30 minutes.  Signed: Barton Dubois, MD [facility] 06/24/2022

## 2022-06-24 NOTE — TOC Transition Note (Signed)
Transition of Care [facility]) - CM/SW Discharge Note   Patient Details  Name: Jared Bridges MRN: [id number] Date of Birth: Oct 20, 1930  Transition of Care [facility]) CM/SW Contact:  Iona Beard, [place] Phone Number: 06/24/2022, 4:21 PM   Clinical Narrative:    Pt medically stable for D/C. CSW updated Kerri with [facility], they are aware and ready for pt. CSW updated pt and family in room of plan for D/C to [facility]. CSW updated RN also. TOC signing off.   Final next level of care: Skilled Nursing Facility Barriers to Discharge: Barriers Resolved   Patient Goals and CMS Choice Patient states their goals for this hospitalization and ongoing recovery are:: Go to SNF CMS Medicare.gov Compare Post Acute Care list provided to:: Patient Represenative (must comment) Choice offered to / list presented to : Adult Children, Patient, Spouse  Discharge Placement              Patient chooses bed at: [facility] Patient to be transferred to facility by: facility staff Name of family member notified: family at bedside Patient and family notified of of transfer: 06/24/22  Discharge Plan and Services In-house Referral: Clinical Social Work Discharge Planning Services: CM Consult Post Acute Care Choice: [place]                               Social Determinants of Health (SDOH) Interventions     Readmission Risk Interventions    06/23/2022    1:26 PM  Readmission Risk Prevention Plan  Transportation Screening Complete  HRI or [place] Complete  Social Work Consult for [place] Planning/Counseling Complete  Palliative Care Screening Not Applicable  Medication Review Press photographer) Complete

## 2022-06-24 NOTE — Progress Notes (Signed)
Pts daughter Jared Bridges called to inform her of patients transport to [facility], no answer, VM left. Jared Bridges aware of transport but when leaving bedside she wasn't sure of what time transport would be and this nurse told her I would call her to let her know when he was transported.

## 2022-06-24 NOTE — Progress Notes (Signed)
Report given to Delphina at the [facility] center, no further questions at this time.

## 2022-06-24 NOTE — Progress Notes (Signed)
Speech Language Pathology Treatment: Dysphagia  Patient Details Name: Jared Bridges MRN: [id number] DOB: 04/28/1930 Today's Date: 06/24/2022 Time: 1093-2355 SLP Time Calculation (min) (ACUTE ONLY): 36 min  Assessment / Plan / Recommendation Clinical Impression  Ongoing diagnostic dysphagia therapy provided today while Pt was sitting upright in bed; Pt reports to me "I'm just so depressed". SLP provided support and Pt initially refused all PO trials but Pt did eventually agree to a chocolate milkshake. SLP provided milkshake via spoon and Pt ate the entire thing and did smile and seem slightly more positive before SLP exited the room. No overt s/sx of aspiration were noted with thin/milkshake trials. ST will continue to follow acutely, thank you.    HPI HPI: Jared Bridges is a 88 y.o. male with medical history significant of hypertension, hyperlipidemia, T2DM, prostate cancer with osseous metastasis, BPH, hearing impairment, diabetic polyneuropathy who presents to the emergency department via EMS from home due to confusion and weakness.  Patient was unable to provide history, history was obtained from ED physician and ED medical record.  Per report, daughter activated EMS stating that patient has progressively become weaker and has been retaining urine. Chest x-ray showed no acute chest findings.      SLP Plan  Continue with current plan of care      Recommendations for follow up therapy are one component of a multi-disciplinary discharge planning process, led by the attending physician.  Recommendations may be updated based on patient status, additional functional criteria and insurance authorization.    Recommendations  Diet recommendations: Thin liquid;Dysphagia 3 (mechanical soft) Liquids provided via: Cup;Straw Medication Administration: Whole meds with puree Supervision: Staff to assist with self feeding;Full supervision/cueing for compensatory strategies Compensations: Minimize  environmental distractions;Slow rate;Small sips/bites;Multiple dry swallows after each bite/sip;Clear throat intermittently Postural Changes and/or Swallow Maneuvers: Seated upright 90 degrees;Upright 30-60 min after meal                Oral Care Recommendations: Oral care BID;Staff/trained caregiver to provide oral care Follow Up Recommendations: Skilled nursing-short term rehab (<3 hours/day) Assistance recommended at discharge: Frequent or constant Supervision/Assistance SLP Visit Diagnosis: Dysphagia, unspecified (R13.10) Plan: Continue with current plan of care          Kayo Zion H. Roddie Mc, CCC-SLP Speech Language Pathologist  Wende Bushy  06/24/2022, 11:09 AM

## 2022-06-25 ENCOUNTER — Non-Acute Institutional Stay (SKILLED_NURSING_FACILITY): Payer: Medicare Other | Admitting: Adult Health

## 2022-06-25 ENCOUNTER — Encounter: Payer: Self-pay | Admitting: Adult Health

## 2022-06-25 ENCOUNTER — Other Ambulatory Visit: Payer: Self-pay | Admitting: Adult Health

## 2022-06-25 DIAGNOSIS — N390 Urinary tract infection, site not specified: Secondary | ICD-10-CM

## 2022-06-25 DIAGNOSIS — G319 Degenerative disease of nervous system, unspecified: Secondary | ICD-10-CM

## 2022-06-25 DIAGNOSIS — F0150 Vascular dementia without behavioral disturbance: Secondary | ICD-10-CM

## 2022-06-25 DIAGNOSIS — E1165 Type 2 diabetes mellitus with hyperglycemia: Secondary | ICD-10-CM | POA: Diagnosis not present

## 2022-06-25 DIAGNOSIS — D509 Iron deficiency anemia, unspecified: Secondary | ICD-10-CM

## 2022-06-25 DIAGNOSIS — E1159 Type 2 diabetes mellitus with other circulatory complications: Secondary | ICD-10-CM

## 2022-06-25 DIAGNOSIS — E785 Hyperlipidemia, unspecified: Secondary | ICD-10-CM

## 2022-06-25 DIAGNOSIS — I152 Hypertension secondary to endocrine disorders: Secondary | ICD-10-CM

## 2022-06-25 DIAGNOSIS — R319 Hematuria, unspecified: Secondary | ICD-10-CM

## 2022-06-25 DIAGNOSIS — K219 Gastro-esophageal reflux disease without esophagitis: Secondary | ICD-10-CM

## 2022-06-25 DIAGNOSIS — N401 Enlarged prostate with lower urinary tract symptoms: Secondary | ICD-10-CM

## 2022-06-25 DIAGNOSIS — N138 Other obstructive and reflux uropathy: Secondary | ICD-10-CM

## 2022-06-25 DIAGNOSIS — E1169 Type 2 diabetes mellitus with other specified complication: Secondary | ICD-10-CM

## 2022-06-25 DIAGNOSIS — I5032 Chronic diastolic (congestive) heart failure: Secondary | ICD-10-CM | POA: Diagnosis not present

## 2022-06-25 DIAGNOSIS — A419 Sepsis, unspecified organism: Secondary | ICD-10-CM

## 2022-06-25 DIAGNOSIS — Z8673 Personal history of transient ischemic attack (TIA), and cerebral infarction without residual deficits: Secondary | ICD-10-CM

## 2022-06-25 DIAGNOSIS — E1142 Type 2 diabetes mellitus with diabetic polyneuropathy: Secondary | ICD-10-CM

## 2022-06-25 DIAGNOSIS — F339 Major depressive disorder, recurrent, unspecified: Secondary | ICD-10-CM

## 2022-06-25 DIAGNOSIS — R6521 Severe sepsis with septic shock: Secondary | ICD-10-CM

## 2022-06-25 MED ORDER — PREGABALIN 75 MG PO CAPS
75.0000 mg | ORAL_CAPSULE | Freq: Two times a day (BID) | ORAL | 0 refills | Status: DC
Start: 2022-06-25 — End: 2022-07-09

## 2022-06-25 NOTE — Progress Notes (Signed)
Location:  [place] Room Number: [place] 143P Place of Service:  SNF (31)   CODE STATUS: Full Code  Allergies  Allergen Reactions   Iodine Hives, Itching and Other (See Comments)    very allergic(per daughter), can pre-med with benadryl   Calan [Verapamil] Other (See Comments)    Weakness   Cymbalta [Duloxetine Hcl] Other (See Comments)    Makes patient have jerking motions.   Iodinated Contrast Media Hives, Itching and Other (See Comments)    Can pre-med with benadryl   Latex Other (See Comments)    Redness iritation   Lisinopril Other (See Comments)    Makes patient have jerking motions.   Neurontin [Gabapentin] Other (See Comments)    Makes patient have jerking motions.   Zoloft [Sertraline Hcl] Diarrhea and Other (See Comments)    Makes patient have jerking motions.   Valium [Diazepam] Other (See Comments)    "Did not work"   Flomax [Tamsulosin Hcl] Other (See Comments)    Dizziness   Glimepiride Other (See Comments)    "Did not work"   Melatonin Nausea Only   Tape Other (See Comments)    Skin irritation    Chief Complaint  Patient presents with   Hospitalization Follow-up    Hospital Follow up    HPI:  He is a 88 year old man who has been hospitalization from 06-15-22 through 06-24-22. His medical history includes: CHF; BPH; hyperlipidemia; essential hypertension. He presented to the ED with increased confusion and weakness.  He had morganella morgagni septic shock presumed from a urinary source; aspiration pneumonia. His WBC 12.6-39.7-7.7.  he required vasopressins and IVF until 06-18-22. His vital signs have been normal swallow study; allows for dysphagia 3 diet with thin liquids. He has a history of urinary retention/BPH with LUTS. He had a foley/coude catheter was placed on 06-16-22 and removed on 06-22-22.  Heart failure with preserved EF. He had new onset shortness of breath with the chest x-ray re demonstrating bilateral atelectasis with  pleural effusion. He is now on lasix every other day.  At this time his goal is to return back home.  He denies any pain. Tells me that he is feeling good. He will continue to be followed for his chronic illnesses including:  Hyperlipidemia associated with type 2 diabetes mellitus: Chronic diastolic CHF (congestive heart failure)  Hypertension associated with type 2 diabetes mellitus: Controlled type 2 diabetes mellitus with hyperglycemia without long term current use of insulin:       Past Medical History:  Diagnosis Date   Anemia, iron deficiency    Chronic headaches    Migraines   Chronic kidney disease    Resolved   Depression    Diverticulitis    Essential hypertension    GI bleed    Dr. Laural [place] - 1998   Hypercholesteremia    Orthostatic hypotension    Peptic ulcer disease    Peripheral neuropathy    Polymyalgia rheumatica ([name]    Stroke ([place])    Residual trouble reading and writing   Syncope    Thyroid nodule    TIA (transient ischemic attack) 12/2017   Type 2 diabetes mellitus ([place])     Past Surgical History:  Procedure Laterality Date   APPENDECTOMY     as child   BACK SURGERY     2000 IDET SPINAL PROC   CHOLECYSTECTOMY N/A 04/07/2016   Procedure: CHOLECYSTECTOMY;  Surgeon: Aviva Signs, MD;  Location: [facility] OR[facility];  Service: General;  Laterality: N/A;   COLON SURGERY     FOR DIVERTICULOSIS   1988   COLONOSCOPY N/A 01/07/2018   Procedure: COLONOSCOPY;  Surgeon: Rogene Houston, MD;  Location: [facility] SU[facility];  Service: Endoscopy;  Laterality: N/A;  1:00   CRYOTHERAPY     EYE SURGERY     right cataract with lens implant   FEMUR SURGERY     RT LEG        1953   HAND SURGERY     RIGHT   HERNIA REPAIR     1-right inguinal, 3- left inguinal   IRRIGATION AND DEBRIDEMENT KNEE Left 07/09/2015   Procedure: LEFT KNEE IRRIGATION AND DEBRIDEMENT WOUND CLOSURE;  Surgeon: Gaynelle Arabian, MD;  Location: [facility];  Service: Orthopedics;  Laterality: Left;   JOINT REPLACEMENT  02/28/2016    lt knee revision   KNEE DEBRIDEMENT     2016   LEFT   MENISCUS REPAIR     LEFT   POLYPECTOMY  01/07/2018   Procedure: POLYPECTOMY;  Surgeon: Rogene Houston, MD;  Location: [facility];  Service: Endoscopy;;  colon    SHOULDER OPEN ROTATOR CUFF REPAIR     RT SHOULDER   SPINAL FUSION     SPINAL FUSION     TONSILLECTOMY     TOTAL KNEE ARTHROPLASTY Left 05/28/2015   Procedure: LEFT TOTAL KNEE ARTHROPLASTY;  Surgeon: Gaynelle Arabian, MD;  Location: [facility];  Service: Orthopedics;  Laterality: Left;   TOTAL KNEE REVISION Left 02/28/2016   Procedure: LEFT TOTAL KNEE REVISION;  Surgeon: Leandrew Koyanagi, MD;  Location: [place];  Service: Orthopedics;  Laterality: Left;   TRIGGER FINGER RELEASE     RT HAND   1990S   UVULECTOMY  2004   TURBINATE,TONSIL,ADENOID    Social History   Socioeconomic History   Marital status: Married    Spouse name: Not on file   Number of children: Not on file   Years of education: Not on file   Highest education level: Not on file  Occupational History   Not on file  Tobacco Use   Smoking status: Former    Packs/day: 0.10    Years: 18.00    Total pack years: 1.80    Types: Cigarettes    Start date: 12/18/1958    Quit date: 12/01/1986    Years since quitting: 35.5   Smokeless tobacco: Never  Vaping Use   Vaping Use: Never used  Substance and Sexual Activity   Alcohol use: No    Alcohol/week: 0.0 standard drinks of alcohol   Drug use: No   Sexual activity: Never  Other Topics Concern   Not on file  Social History Narrative   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   Social Determinants of Health   Financial Resource Strain: Not on file  Food Insecurity: Not on file  Transportation Needs: Not on file  Physical Activity: Not on file  Stress: Not on file  Social Connections: Not on file  Intimate Partner Violence: Not on file   Family History  Problem Relation Age of Onset   Colon cancer Sister    Hypertension Other    Depression  Brother    Depression Daughter       VITAL SIGNS BP 127/75   Pulse 79   Temp (!) 97.5 F (36.4 C) (Skin)   Ht '6\' 1"'$  (1.854 m)   Wt 178 lb 9.6 oz (81 kg)   SpO2 94%   BMI 23.56 kg/m  Outpatient Encounter Medications as of 06/25/2022  Medication Sig   acetaminophen (TYLENOL) 325 MG tablet Take 2 tablets (650 mg total) by mouth 3 (three) times daily.   atorvastatin (LIPITOR) 40 MG tablet Take 1 tablet (40 mg total) by mouth daily.   Calcium Carb-Cholecalciferol (CALCIUM 500/D) 500-10 MG-MCG CHEW Take one by mouth twice daily.   Cholecalciferol (VITAMIN D3) 250 MCG (10000 UT) TABS Take 1,000 Units by mouth daily.   diclofenac sodium (VOLTAREN) 1 % GEL Apply 2 g topically daily as needed (for pain).   escitalopram (LEXAPRO) 20 MG tablet Take 20 mg by mouth daily.   ferrous sulfate 325 (65 FE) MG tablet Take 325 mg by mouth 3 (three) times a week. Mon, wed, fri   finasteride (PROSCAR) 5 MG tablet Take 1 tablet (5 mg total) by mouth daily.   furosemide (LASIX) 20 MG tablet Take 1 tablet (20 mg total) by mouth every other day.   omeprazole (PRILOSEC) 40 MG capsule Take 40 mg by mouth daily.   pregabalin (LYRICA) 75 MG capsule Take 1 capsule (75 mg total) by mouth 2 (two) times daily.   sulfamethoxazole-trimethoprim (BACTRIM DS) 800-160 MG tablet Take 1 tablet by mouth every 12 (twelve) hours for 4 days.   topiramate (TOPAMAX) 50 MG tablet Take 0.5 tablets (25 mg total) by mouth 2 (two) times daily.   vitamin B-12 (CYANOCOBALAMIN) 500 MCG tablet Take 1,000 mcg by mouth daily.   No facility-administered encounter medications on file as of 06/25/2022.     SIGNIFICANT DIAGNOSTIC EXAMS  TODAY  06-15-22: chest x-ray:  The heart is normal in size. Stable mediastinal tortuous [place]. No acute airspace disease. No pulmonary edema, pleural effusion, or pneumothorax. The patient's chin partially obscures the medial apices. Acute osseous findings.  06-15-22: ct of head:  1. No acute intracranial  abnormality. 2. Stable atrophy and chronic small vessel ischemic change.  06-23-22: swallow study:  Recommend upgrade Pt's diet to mechanical soft/D3 and thin liquids with strategies re: NO STRAWS, ensure Pt is sitting upright and alert for all PO, occasional throat clears/cough and repeat dry swallows throughout meal. Recommend crush meds in puree or administer whole in puree   06-23-22: chest x-ray:  New layering bilateral pleural effusions with increased bilateral lower lung atelectasis.  LABS REVIEWED TODAY;   06-15-22: wbc 12.6; hgb 10.5; hct 33.3; mcv 90.5 plt 238; glucose 139; bun 27; creat 1.15; k+ 3.8; na++ 137; ca 9.1; gfr >60;  06-16-22: blood culture: morganella morganii; mag 1.7; phos 3.0 liver function: protein 5.9; albumin 2.6 06-18-22: wbc 27.6; hgb 8.5; hct 27.1; mcv 91.9 plt 115; glucose 76; bun 34; creat 0.87; k+ 3.2; na++ 138; ca 7.8; gfr >60 protein 5.0; albumin 2.0  06-24-22: wbc 6.2; hgb 11.4; hct 35.4; mcv 88.3 plt 250; glucose 110; bun 8; creat 0.69; k+ 4.0; na++ 138; ca 7.9; gfr >60    Review of Systems  Constitutional:  Negative for malaise/fatigue.  Respiratory:  Negative for cough and shortness of breath.   Cardiovascular:  Negative for chest pain, palpitations and leg swelling.  Gastrointestinal:  Negative for abdominal pain, constipation and heartburn.  Musculoskeletal:  Negative for back pain, joint pain and myalgias.  Skin: Negative.   Neurological:  Negative for dizziness.  Psychiatric/Behavioral:  The patient is not nervous/anxious.    Physical Exam Constitutional:      General: He is not in acute distress.    Appearance: He is well-developed. He is not diaphoretic.  Neck:  Thyroid: No thyromegaly.  Cardiovascular:     Rate and Rhythm: Normal rate and regular rhythm.     Pulses: Normal pulses.     Heart sounds: Normal heart sounds.  Pulmonary:     Effort: Pulmonary effort is normal. No respiratory distress.     Breath sounds: Normal breath  sounds.  Abdominal:     General: Bowel sounds are normal. There is no distension.     Palpations: Abdomen is soft.     Tenderness: There is no abdominal tenderness.  Musculoskeletal:        General: Normal range of motion.     Cervical back: Neck supple.     Right lower leg: No edema.     Left lower leg: No edema.  Lymphadenopathy:     Cervical: No cervical adenopathy.  Skin:    General: Skin is warm and dry.     Comments: Bilateral lower extremities with venous changes Bruising to hands and arms.   Neurological:     Mental Status: He is alert. Mental status is at baseline.  Psychiatric:        Mood and Affect: Mood normal.       ASSESSMENT/ PLAN:  TODAY  Septic shock/sepsis due to unspecified organism/urinary tract infection with hematuria unspecified site: is on bractrim DS twice daily for 4 more days; will monitor his status. Will continue therapy as directed to improve upon her level of independence with her adls.   2. Hyperlipidemia associated with type 2 diabetes mellitus: will continue lipitor 40 mg daily   3. Chronic diastolic CHF (congestive heart failure) EF 65-70%: will continue lasix 20 mg every other day  4. Hypertension associated with type 2 diabetes mellitus: b/p 127/75 will continue to monitor   5. Controlled type 2 diabetes mellitus with hyperglycemia without long term current use of insulin: will continue to monitor his status  6. Diabetic peripheral neuropathy: will continue tylenol 650 mg three times daily; lyrica  75 mg twice daily; topamax 25 mg twice daily   7.  History of cerebrovascular accident: will monitor   8. Benign prostatic hyperplasia with urinary obstruction: has foley in hospital has been removed. Will continue proscar 5 mg daily   9. Depression recurrent: will continue lexapro 20 mg daily   10. Iron deficiency anemia unspecified iron deficiency type: hgb 11.4 will continue iron three times weekly   11. Vascular dementia without  behavioral disturbance: neurodegenerative  cognitive impairment: per ct 06-17-22: atrophy and chronic small vessel ischemic change.weight is 178 pounds will monitor   12. GERD without esophagitis: will continue prilosec 40 mg daily     Ok Edwards NP [facility]  call [phone]

## 2022-06-26 DIAGNOSIS — E1159 Type 2 diabetes mellitus with other circulatory complications: Secondary | ICD-10-CM | POA: Insufficient documentation

## 2022-06-26 DIAGNOSIS — E1169 Type 2 diabetes mellitus with other specified complication: Secondary | ICD-10-CM | POA: Insufficient documentation

## 2022-06-26 DIAGNOSIS — F0150 Vascular dementia without behavioral disturbance: Secondary | ICD-10-CM | POA: Insufficient documentation

## 2022-06-26 DIAGNOSIS — Z8673 Personal history of transient ischemic attack (TIA), and cerebral infarction without residual deficits: Secondary | ICD-10-CM | POA: Insufficient documentation

## 2022-06-26 DIAGNOSIS — G319 Degenerative disease of nervous system, unspecified: Secondary | ICD-10-CM | POA: Insufficient documentation

## 2022-06-27 ENCOUNTER — Encounter: Payer: Self-pay | Admitting: Adult Health

## 2022-07-01 ENCOUNTER — Non-Acute Institutional Stay (SKILLED_NURSING_FACILITY): Payer: Medicare Other | Admitting: Internal Medicine

## 2022-07-01 ENCOUNTER — Encounter: Payer: Self-pay | Admitting: Internal Medicine

## 2022-07-01 DIAGNOSIS — I5189 Other ill-defined heart diseases: Secondary | ICD-10-CM

## 2022-07-01 DIAGNOSIS — R6521 Severe sepsis with septic shock: Secondary | ICD-10-CM

## 2022-07-01 DIAGNOSIS — D649 Anemia, unspecified: Secondary | ICD-10-CM | POA: Diagnosis not present

## 2022-07-01 DIAGNOSIS — C61 Malignant neoplasm of prostate: Secondary | ICD-10-CM

## 2022-07-01 DIAGNOSIS — A419 Sepsis, unspecified organism: Secondary | ICD-10-CM | POA: Diagnosis not present

## 2022-07-01 NOTE — Assessment & Plan Note (Signed)
Oncologist Dr.Hoimes @ [facility] recommended holding Xtandi during his hospitalization for septic shock.  Urologist at [facility] is [employer] [employer].

## 2022-07-01 NOTE — Assessment & Plan Note (Addendum)
7/16 - 06/24/2022 hospitalized with septic shock due to bacteremia presumed from UTI.  Admission H/H 10.5/33.3; nadir H/H 8.5/27.1; H/H prior to discharge 11.4/35.4. No bleeding dyscrasias reported while hospitalized or at [facility].  Drop in H/H attributed in part to IV resuscitation for septic shock. Daughter states she did not receive a transfusion while hospitalized.  H/H be rechecked to verify present status.

## 2022-07-01 NOTE — Patient Instructions (Signed)
See assessment and plan under each diagnosis in the problem list and acutely for this visit 

## 2022-07-01 NOTE — Assessment & Plan Note (Signed)
7/16 - 06/24/2022 septic shock due to Morganella morganii bacteremia.  Course complicated by exacerbation of diastolic dysfunction.  CXR revealed bilateral atelectasis with pleural effusion.  Single dose IV Lasix transition to every other day Lasix at discharge.  I recommended to family that excess sodium intake be avoided.

## 2022-07-01 NOTE — Progress Notes (Unsigned)
NURSING HOME LOCATION:  [facility] ROOM NUMBER: [id number] P  CODE STATUS:  Full Coreg  PCP:  Rozann Lesches MD  This is a comprehensive admission note to this SNFperformed on this date less than 30 days from date of admission. Included are preadmission medical/surgical history; reconciled medication list; family history; social history and comprehensive review of systems.  Corrections and additions to the records were documented. Comprehensive physical exam was also performed. Additionally a clinical summary was entered for each active diagnosis pertinent to this admission in the Problem List to enhance continuity of care.  HPI: He was hospitalized 7/16-7/25/2023 with septic shock due to Morganella morganii bacteremia presumably from urinary source in the context of history of urinary retention due to BPH with LUTS.  Foley catheter/coud catheter placed on 7/17 with production of 400 mL of urine.  Aspiration pneumonia was clinically suspected.  Sepsis criteria were met with lactic acid elevation to 5.4 and white count up to 39,700.  Levophed and vasopressin required for pressure support along with IV fluid resuscitation until 7/19.  Broad-spectrum antibiotics were transitioned to oral Bactrim. Course was complicated by exacerbation of diastolic dysfunction with associated dyspnea.  Chest x-ray revealed bilateral atelectasis with pleural effusion.  He received 1 dose of IV Lasix with transition to every other day Lasix at discharge. Voiding trial was successful on 7/23 and Foley catheter discontinued. Improvement in blood pressure allowed reinitiation of Proscar. Modified barium swallow allowed diet upgraded to dysphagia 3 with thin liquids. Anemia of chronic disease was complicated by progression of anemia.  Admission H/H was 10.5/33.3.  Nadir H/H was 8.5/27.1, this was attributed to hemodilution from IV fluids for sepsis.  No clinical bleeding dyscrasia was documented. Final H/H  was 11.4/35.4. Labs prior to discharge revealed creatinine of 0.69 and GFR greater than 60.  Calcium was 7.9.    Despite advanced age and multiple complex and serious comorbidities and palliative care consult; DNR/DNI CODE STATUS was changed to full code.  Past medical and surgical history: Includes a history of diabetes with CKD stage II, history of diverticulitis, essential hypertension, history of bleeding peptic ulcer, dyslipidemia, peripheral neuropathy, history of polymyalgia rheumatica, history of stroke, and history of prostate cancer.   Surgeries and procedures include cholecystectomy, diverticular colonic surgery, colon polypectomy, uvulectomy, and spinal fusion.  Social history: Insignificant smoking history; nondrinker.  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  Family history: Noncontributory due to advanced age.   Review of systems: He initially gave the date as July 2123 but then corrected it to July 01, 2022.  He did exhibit difficulty with word retrieval and his daughter was a primary historian. She states that he has been nonambulatory since he sustained fractures of C6 and C7 slipping on a bar soap and landing on a marble floor.  She states that she has noted some stool incontinence. She states that he had had urinary retention for approximately 10 days prior to admission.  He is followed by a urologist and oncologist at [facility].  He has been on chemotherapy with Xtandi for prostate cancer; this was held during the hospitalization at the direction of his oncologist. She states that on 7/16 he developed delirium and was profoundly weak even beyond his baseline neurologic  deficits, even unable to stand with help.  She stated that his urine was Coca[employer]a colored. She  validates that he is followed for an aortic aneurysm; she is unsure of its dimensions.  She also validated that he has carotid artery disease as well which is monitored. He denies any active symptoms at this time including dysphagia.  He has no GU symptoms. He has history of bleeding ulcer on at least 2 occasions but the daughter is noted no rectal bleeding or melena. Constitutional: No fever, significant weight change, fatigue  Eyes: No redness, discharge, pain, vision change ENT/mouth: No nasal congestion, purulent discharge, earache, change in hearing, sore throat  Cardiovascular: No chest pain, palpitations, paroxysmal nocturnal dyspnea, claudication, edema  Respiratory: No cough, sputum production, hemoptysis, DOE, significant snoring, apnea Gastrointestinal: No heartburn, dysphagia, abdominal pain, nausea /vomiting, rectal bleeding, melena, change in bowels Genitourinary: No dysuria, hematuria, pyuria, incontinence, nocturia Musculoskeletal: No joint stiffness, joint swelling, weakness, pain Dermatologic: No rash, pruritus, change in appearance of skin Neurologic: No dizziness, headache, syncope, seizures, numbness, tingling Psychiatric: No significant anxiety, depression, insomnia, anorexia Endocrine: No change in hair/skin/nails, excessive thirst, excessive hunger, excessive urination  Hematologic/lymphatic: No significant bruising, lymphadenopathy, abnormal bleeding Allergy/immunology: No itchy/watery eyes, significant sneezing, urticaria, angioedema  Physical exam:  Pertinent or positive findings: He appears his age and somewhat chronically ill.  He is hard of hearing.  Pattern alopecia is present and hair is disheveled.  Facies tend to be blank.  As noted he does exhibit some word retrieval.  There is malalignment of the teeth.  He exhibits an intermittent nonproductive cough.  A brisk grade 1 systolic murmur is noted at the base.  He also has bilateral carotid bruits as well as an  aortic bruit in the epigastrium.  I could not palpate aortic enlargement.  Pedal pulses are decreased; the dorsalis pedis pulses are stronger than posterior tibial pulses.  Marked interosseous wasting of the hands is present.  He has extensive bruising over the upper extremities, left greater than right.  He has scattered vitiliginous scarring over the upper extremities.  Well-healed operative scars are present over the knees. General appearance: Adequately nourished; no acute distress, increased work of breathing is present.   Lymphatic: No lymphadenopathy about the head, neck, axilla. Eyes: No conjunctival inflammation or lid edema is present. There is no scleral icterus. Ears:  External ear exam shows no significant lesions or deformities.   Nose:  External nasal examination shows no deformity or inflammation. Nasal mucosa are pink and moist without lesions, exudates Oral exam: Lips and gums are healthy appearing.There is no oropharyngeal erythema or exudate. Neck:  No thyromegaly, masses, tenderness noted.    Heart:  Normal rate and regular rhythm. S1 and S2 normal without gallop, murmur, click, rub.  Lungs: Chest clear to auscultation without wheezes, rhonchi, rales, rubs. Abdomen: Bowel sounds are normal.  Abdomen is soft and nontender with no organomegaly, hernias, masses. GU: Deferred  Extremities:  No cyanosis, clubbing, edema. Neurologic exam:  Strength equal  in upper & lower extremities. Balance, Rhomberg, finger to nose testing could not be completed due to clinical state Deep tendon reflexes are equal Skin: Warm & dry w/o tenting. No significant lesions or rash.  See clinical summary under each active problem in the Problem List with associated updated therapeutic plan

## 2022-07-02 NOTE — Assessment & Plan Note (Signed)
Clinically sepsis resolved, but he is markedly debilitated. PT/OT @ SNF as tolerated.

## 2022-07-03 ENCOUNTER — Other Ambulatory Visit (HOSPITAL_COMMUNITY)
Admission: RE | Admit: 2022-07-03 | Discharge: 2022-07-03 | Disposition: A | Discharge status: Home / Self Care | Payer: Medicare Other | Source: Skilled Nursing Facility | Attending: Adult Health | Admitting: Adult Health

## 2022-07-03 ENCOUNTER — Encounter: Payer: Self-pay | Admitting: Adult Health

## 2022-07-03 DIAGNOSIS — D509 Iron deficiency anemia, unspecified: Secondary | ICD-10-CM | POA: Insufficient documentation

## 2022-07-04 ENCOUNTER — Encounter (HOSPITAL_COMMUNITY)
Admission: RE | Admit: 2022-07-04 | Discharge: 2022-07-04 | Disposition: A | Discharge status: Home / Self Care | Payer: Medicare Other | Source: Skilled Nursing Facility | Attending: Adult Health | Admitting: Adult Health

## 2022-07-04 DIAGNOSIS — D509 Iron deficiency anemia, unspecified: Secondary | ICD-10-CM | POA: Insufficient documentation

## 2022-07-04 LAB — CBC
HCT: 30.4 % — ABNORMAL LOW (ref 39.0–52.0)
Hemoglobin: 9.2 g/dL — ABNORMAL LOW (ref 13.0–17.0)
MCH: 28.4 pg (ref 26.0–34.0)
MCHC: 30.3 g/dL (ref 30.0–36.0)
MCV: 93.8 fL (ref 80.0–100.0)
Platelets: 204 10*3/uL (ref 150–400)
RBC: 3.24 MIL/uL — ABNORMAL LOW (ref 4.22–5.81)
RDW: 15.4 % (ref 11.5–15.5)
WBC: 4.6 10*3/uL (ref 4.0–10.5)
nRBC: 0 % (ref 0.0–0.2)

## 2022-07-08 ENCOUNTER — Ambulatory Visit: Payer: Medicare Other | Admitting: Physician Assistant

## 2022-07-09 ENCOUNTER — Non-Acute Institutional Stay (SKILLED_NURSING_FACILITY): Payer: Medicare Other | Admitting: Adult Health

## 2022-07-09 ENCOUNTER — Encounter: Payer: Self-pay | Admitting: Adult Health

## 2022-07-09 ENCOUNTER — Other Ambulatory Visit: Payer: Self-pay | Admitting: Adult Health

## 2022-07-09 DIAGNOSIS — E1159 Type 2 diabetes mellitus with other circulatory complications: Secondary | ICD-10-CM | POA: Diagnosis not present

## 2022-07-09 DIAGNOSIS — I5032 Chronic diastolic (congestive) heart failure: Secondary | ICD-10-CM

## 2022-07-09 DIAGNOSIS — R6521 Severe sepsis with septic shock: Secondary | ICD-10-CM

## 2022-07-09 DIAGNOSIS — I152 Hypertension secondary to endocrine disorders: Secondary | ICD-10-CM | POA: Diagnosis not present

## 2022-07-09 DIAGNOSIS — A419 Sepsis, unspecified organism: Secondary | ICD-10-CM | POA: Diagnosis not present

## 2022-07-09 DIAGNOSIS — E7800 Pure hypercholesterolemia, unspecified: Secondary | ICD-10-CM

## 2022-07-09 MED ORDER — ESCITALOPRAM OXALATE 20 MG PO TABS: 20.0000 mg | ORAL_TABLET | Freq: Every day | ORAL | 0 refills | Status: DC

## 2022-07-09 MED ORDER — FERROUS SULFATE 325 (65 FE) MG PO TABS: 325.0000 mg | ORAL_TABLET | ORAL | 0 refills | Status: DC

## 2022-07-09 MED ORDER — FINASTERIDE 5 MG PO TABS: 5.0000 mg | ORAL_TABLET | Freq: Every day | ORAL | 0 refills | Status: DC

## 2022-07-09 MED ORDER — ATORVASTATIN CALCIUM 40 MG PO TABS: 40.0000 mg | ORAL_TABLET | Freq: Every day | ORAL | 0 refills | Status: DC

## 2022-07-09 MED ORDER — TOPIRAMATE 50 MG PO TABS: 25.0000 mg | ORAL_TABLET | Freq: Two times a day (BID) | ORAL | 0 refills | Status: DC

## 2022-07-09 MED ORDER — OMEPRAZOLE 40 MG PO CPDR
40.0000 mg | DELAYED_RELEASE_CAPSULE | Freq: Every day | ORAL | 0 refills | Status: DC
Start: 2022-07-09 — End: 2022-08-15

## 2022-07-09 MED ORDER — PREGABALIN 75 MG PO CAPS
75.0000 mg | ORAL_CAPSULE | Freq: Two times a day (BID) | ORAL | 0 refills | Status: DC
Start: 2022-07-09 — End: 2023-07-19

## 2022-07-09 MED ORDER — FUROSEMIDE 20 MG PO TABS: 20.0000 mg | ORAL_TABLET | ORAL | 0 refills | Status: DC

## 2022-07-09 NOTE — Progress Notes (Signed)
Location:  [place] Room Number: 673 Place of Service:  SNF (31)  Provider: Ok Edwards np   PCP: No primary care provider on file. Patient Care Team: [name], MD as PCP - Cardiology (Cardiology) [name], MD as Consulting Physician (Cardiology)  Extended Emergency Contact Information Primary Emergency Contact: [facility] Address: [place], [place] [address] Johnnette Litter of [place] Mobile Phone: [phone] Relation: Daughter  Code Status: full  Goals of care:  Advanced Directive information    06/16/2022    6:28 AM  Advanced Directives  Does Patient Have a Medical Advance Directive? No  Would patient like information on creating a medical advance directive? No - Patient declined     Allergies  Allergen Reactions   Iodine Hives, Itching and Other (See Comments)    very allergic(per daughter), can pre-med with benadryl   Calan [Verapamil] Other (See Comments)    Weakness   Cymbalta [Duloxetine Hcl] Other (See Comments)    Makes patient have jerking motions.   Iodinated Contrast Media Hives, Itching and Other (See Comments)    Can pre-med with benadryl   Latex Other (See Comments)    Redness iritation   Lisinopril Other (See Comments)    Makes patient have jerking motions.   Neurontin [Gabapentin] Other (See Comments)    Makes patient have jerking motions.   Zoloft [Sertraline Hcl] Diarrhea and Other (See Comments)    Makes patient have jerking motions.   Valium [Diazepam] Other (See Comments)    "Did not work"   Flomax [Tamsulosin Hcl] Other (See Comments)    Dizziness   Glimepiride Other (See Comments)    "Did not work"   Melatonin Nausea Only   Tape Other (See Comments)    Skin irritation    Chief Complaint  Patient presents with   Discharge Note    HPI:  88 y.o. male  being discharged to home with home health for pt/ot/rn. He will need home 02 to maintain 02 sats. He will need his  prescriptions written and will need to follow up with his medical provider. He had been hospitalized for septic shock pneumonia and chf. He was admitted to this facility for short term rehab. He has participated in pt/ot to improve upon his independence with his adls. He is ready to discharge to home.     Past Medical History:  Diagnosis Date   Anemia, iron deficiency    Chronic headaches    Migraines   Chronic kidney disease    Resolved   Depression    Diverticulitis    Essential hypertension    GI bleed    Dr. Laural Golden - 1998   Hypercholesteremia    Orthostatic hypotension    Peptic ulcer disease    Peripheral neuropathy    Polymyalgia rheumatica ([name]    Stroke ([place])    Residual trouble reading and writing   Syncope    Thyroid nodule    TIA (transient ischemic attack) 12/2017   Type 2 diabetes mellitus ([place])     Past Surgical History:  Procedure Laterality Date   APPENDECTOMY     as child   BACK SURGERY     2000 IDET SPINAL PROC   CHOLECYSTECTOMY N/A 04/07/2016   Procedure: CHOLECYSTECTOMY;  Surgeon: Aviva Signs, MD;  Location: [facility]S;  Service: General;  Laterality: N/A;   [place]  1988   COLONOSCOPY N/A 01/07/2018   Procedure: COLONOSCOPY;  Surgeon: Rogene Houston, MD;  Location: [facility] END[facility] SU[facility];  Service: Endoscopy;  Laterality: N/A;  1:00   CRYOTHERAPY     EYE SURGERY     right cataract with lens implant   FEMUR SURGERY     RT LEG        1953   HAND SURGERY     RIGHT   HERNIA REPAIR     1-right inguinal, 3- left inguinal   IRRIGATION AND DEBRIDEMENT KNEE Left 07/09/2015   Procedure: LEFT KNEE IRRIGATION AND DEBRIDEMENT WOUND CLOSURE;  Surgeon: Gaynelle Arabian, MD;  Location: [facility];  Service: Orthopedics;  Laterality: Left;   JOINT REPLACEMENT  02/28/2016   lt knee revision   KNEE DEBRIDEMENT     2016   LEFT   MENISCUS REPAIR     LEFT   POLYPECTOMY  01/07/2018   Procedure: POLYPECTOMY;  Surgeon: Rogene Houston, MD;  Location: [facility]  ENDO SUITE;  Service: Endoscopy;;  colon    SHOULDER OPEN ROTATOR CUFF REPAIR     RT SHOULDER   SPINAL FUSION     SPINAL FUSION     TONSILLECTOMY     TOTAL KNEE ARTHROPLASTY Left 05/28/2015   Procedure: LEFT TOTAL KNEE ARTHROPLASTY;  Surgeon: Gaynelle Arabian, MD;  Location: [facility];  Service: Orthopedics;  Laterality: Left;   TOTAL KNEE REVISION Left 02/28/2016   Procedure: LEFT TOTAL KNEE REVISION;  Surgeon: Leandrew Koyanagi, MD;  Location: [facility];  Service: Orthopedics;  Laterality: Left;   TRIGGER FINGER RELEASE     RT HAND   1990S   UVULECTOMY  2004   TURBINATE,TONSIL,ADENOID      reports that he quit smoking about 35 years ago. His smoking use included cigarettes. He started smoking about 63 years ago. He has a 1.80 pack-year smoking history. He has never used smokeless tobacco. He reports that he does not drink alcohol and does not use drugs. Social History   Socioeconomic History   Marital status: Married    Spouse name: Not on file   Number of children: Not on file   Years of education: Not on file   Highest education level: Not on file  Occupational History   Not on file  Tobacco Use   Smoking status: Former    Packs/day: 0.10    Years: 18.00    Total pack years: 1.80    Types: Cigarettes    Start date: 12/18/1958    Quit date: 12/01/1986    Years since quitting: 35.6   Smokeless tobacco: Never  Vaping Use   Vaping Use: Never used  Substance and Sexual Activity   Alcohol use: No    Alcohol/week: 0.0 standard drinks of alcohol   Drug use: No   Sexual activity: Never  Other Topics Concern   Not on file  Social History Narrative   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   Social Determinants of Health   Financial Resource Strain: Not on file  Food Insecurity: Not on file  Transportation Needs: Not on file  Physical Activity: Not on file  Stress: Not on file  Social Connections: Not on file  Intimate Partner Violence: Not on file   Functional Status  Survey:    Allergies  Allergen Reactions   Iodine Hives, Itching and Other (See Comments)    very allergic(per daughter), can pre-med with benadryl   Calan [Verapamil] Other (See Comments)    Weakness  Cymbalta [Duloxetine Hcl] Other (See Comments)    Makes patient have jerking motions.   Iodinated Contrast Media Hives, Itching and Other (See Comments)    Can pre-med with benadryl   Latex Other (See Comments)    Redness iritation   Lisinopril Other (See Comments)    Makes patient have jerking motions.   Neurontin [Gabapentin] Other (See Comments)    Makes patient have jerking motions.   Zoloft [Sertraline Hcl] Diarrhea and Other (See Comments)    Makes patient have jerking motions.   Valium [Diazepam] Other (See Comments)    "Did not work"   Flomax [Tamsulosin Hcl] Other (See Comments)    Dizziness   Glimepiride Other (See Comments)    "Did not work"   Melatonin Nausea Only   Tape Other (See Comments)    Skin irritation    Pertinent  Health Maintenance Due  Topic Date Due   FOOT EXAM  Never done   OPHTHALMOLOGY EXAM  Never done   URINE MICROALBUMIN  Never done   HEMOGLOBIN A1C  01/10/2022   INFLUENZA VACCINE  07/01/2022   COLONOSCOPY (Pts 45-61yr Insurance coverage will need to be confirmed)  01/07/2023    Medications: Outpatient Encounter Medications as of 07/09/2022  Medication Sig   acetaminophen (TYLENOL) 325 MG tablet Take 2 tablets (650 mg total) by mouth 3 (three) times daily.   atorvastatin (LIPITOR) 40 MG tablet Take 1 tablet (40 mg total) by mouth daily.   Calcium Carb-Cholecalciferol (CALCIUM 500/D) 500-10 MG-MCG CHEW Take one by mouth twice daily.   Cholecalciferol (VITAMIN D3) 250 MCG (10000 UT) TABS Take 1,000 Units by mouth daily.   diclofenac sodium (VOLTAREN) 1 % GEL Apply 2 g topically daily as needed (for pain).   escitalopram (LEXAPRO) 20 MG tablet Take 1 tablet (20 mg total) by mouth daily.   ferrous sulfate 325 (65 FE) MG tablet Take 1 tablet  (325 mg total) by mouth 3 (three) times a week. Mon, wed, fri   finasteride (PROSCAR) 5 MG tablet Take 1 tablet (5 mg total) by mouth daily.   furosemide (LASIX) 20 MG tablet Take 1 tablet (20 mg total) by mouth every other day.   omeprazole (PRILOSEC) 40 MG capsule Take 1 capsule (40 mg total) by mouth daily.   pregabalin (LYRICA) 75 MG capsule Take 1 capsule (75 mg total) by mouth 2 (two) times daily.   topiramate (TOPAMAX) 50 MG tablet Take 0.5 tablets (25 mg total) by mouth 2 (two) times daily.   vitamin B-12 (CYANOCOBALAMIN) 500 MCG tablet Take 1,000 mcg by mouth daily.   No facility-administered encounter medications on file as of 07/09/2022.    Vitals:   07/09/22 1458  BP: (!) 105/55  Pulse: 70  Resp: 18  Temp: 97.8 F (36.6 C)  SpO2: 96%  Weight: 178 lb 12.8 oz (81.1 kg)  Height: '6\' 1"'$  (1.854 m)   Body mass index is 23.59 kg/m.   PREVIOUS   06-15-22: chest x-ray:  The heart is normal in size. Stable mediastinal tortuous [place]. No acute airspace disease. No pulmonary edema, pleural effusion, or pneumothorax. The patient's chin partially obscures the medial apices. Acute osseous findings.  06-15-22: ct of head:  1. No acute intracranial abnormality. 2. Stable atrophy and chronic small vessel ischemic change.  06-23-22: swallow study:  Recommend upgrade Pt's diet to mechanical soft/D3 and thin liquids with strategies re: NO STRAWS, ensure Pt is sitting upright and alert for all PO, occasional throat clears/cough and repeat dry swallows  throughout meal. Recommend crush meds in puree or administer whole in puree   06-23-22: chest x-ray:  New layering bilateral pleural effusions with increased bilateral lower lung atelectasis.  NO NEW EXAMS   LABS REVIEWED PREVIOUS   06-15-22: wbc 12.6; hgb 10.5; hct 33.3; mcv 90.5 plt 238; glucose 139; bun 27; creat 1.15; k+ 3.8; na++ 137; ca 9.1; gfr >60;  06-16-22: blood culture: morganella morganii; mag 1.7; phos 3.0 liver function: protein  5.9; albumin 2.6 06-18-22: wbc 27.6; hgb 8.5; hct 27.1; mcv 91.9 plt 115; glucose 76; bun 34; creat 0.87; k+ 3.2; na++ 138; ca 7.8; gfr >60 protein 5.0; albumin 2.0  06-24-22: wbc 6.2; hgb 11.4; hct 35.4; mcv 88.3 plt 250; glucose 110; bun 8; creat 0.69; k+ 4.0; na++ 138; ca 7.9; gfr >60   NO NEW LABS   Review of Systems  Constitutional:  Negative for malaise/fatigue.  Respiratory:  Negative for cough and shortness of breath.   Cardiovascular:  Negative for chest pain, palpitations and leg swelling.  Gastrointestinal:  Negative for abdominal pain, constipation and heartburn.  Musculoskeletal:  Negative for back pain, joint pain and myalgias.  Skin: Negative.   Neurological:  Negative for dizziness.  Psychiatric/Behavioral:  The patient is not nervous/anxious.    Physical Exam Constitutional:      General: He is not in acute distress.    Appearance: He is well-developed. He is not diaphoretic.  Neck:     Thyroid: No thyromegaly.  Cardiovascular:     Rate and Rhythm: Normal rate and regular rhythm.     Pulses: Normal pulses.     Heart sounds: Normal heart sounds.  Pulmonary:     Effort: Pulmonary effort is normal. No respiratory distress.     Breath sounds: Normal breath sounds.  Abdominal:     General: Bowel sounds are normal. There is no distension.     Palpations: Abdomen is soft.     Tenderness: There is no abdominal tenderness.  Musculoskeletal:        General: Normal range of motion.     Cervical back: Neck supple.  Lymphadenopathy:     Cervical: No cervical adenopathy.  Skin:    General: Skin is warm and dry.     Comments:  Bilateral lower extremities with venous changes Bruising to hands and arms  Neurological:     Mental Status: He is alert. Mental status is at baseline.  Psychiatric:        Mood and Affect: Mood normal.       Assessment/Plan:    Patient is being discharged with the following home health services:  pt/ot/rn: to evaluate and treat as indicated  for gait balance strength adl training medication management.   Patient is being discharged with the following durable medical equipment:  he requires 02 at 2 liters to maintain 02 sat >=91 %  Patient has been advised to f/u with their PCP in 1-2 weeks to for a transitions of care visit.  Social services at their facility was responsible for arranging this appointment.  Pt was provided with adequate prescriptions of noncontrolled medications to reach the scheduled appointment .  For controlled substances, a limited supply was provided as appropriate for the individual patient.  If the pt normally receives these medications from a pain clinic or has a contract with another physician, these medications should be received from that clinic or physician only).    A 30 day supply of his prescription medications have been sent to [facility].  Time spent with patient 35 minutes: medications; home health; dme.   Ok Edwards NP [facility]  call [phone]

## 2022-07-13 ENCOUNTER — Emergency Department (HOSPITAL_COMMUNITY): Payer: Medicare Other

## 2022-07-13 ENCOUNTER — Emergency Department (HOSPITAL_COMMUNITY)
Admission: EM | Admit: 2022-07-13 | Discharge: 2022-07-16 | Disposition: A | Discharge status: Home / Self Care | Payer: Medicare Other | Attending: Emergency Medicine | Admitting: Emergency Medicine

## 2022-07-13 DIAGNOSIS — Z91040 Latex allergy status: Secondary | ICD-10-CM | POA: Insufficient documentation

## 2022-07-13 DIAGNOSIS — Z8673 Personal history of transient ischemic attack (TIA), and cerebral infarction without residual deficits: Secondary | ICD-10-CM | POA: Insufficient documentation

## 2022-07-13 DIAGNOSIS — Z20822 Contact with and (suspected) exposure to covid-19: Secondary | ICD-10-CM | POA: Diagnosis not present

## 2022-07-13 DIAGNOSIS — Z87440 Personal history of urinary (tract) infections: Secondary | ICD-10-CM | POA: Insufficient documentation

## 2022-07-13 DIAGNOSIS — R058 Other specified cough: Secondary | ICD-10-CM | POA: Diagnosis not present

## 2022-07-13 DIAGNOSIS — R531 Weakness: Secondary | ICD-10-CM | POA: Insufficient documentation

## 2022-07-13 DIAGNOSIS — I10 Essential (primary) hypertension: Secondary | ICD-10-CM | POA: Diagnosis not present

## 2022-07-13 DIAGNOSIS — R2689 Other abnormalities of gait and mobility: Secondary | ICD-10-CM | POA: Diagnosis not present

## 2022-07-13 DIAGNOSIS — I444 Left anterior fascicular block: Secondary | ICD-10-CM | POA: Diagnosis not present

## 2022-07-13 DIAGNOSIS — R059 Cough, unspecified: Secondary | ICD-10-CM | POA: Insufficient documentation

## 2022-07-13 DIAGNOSIS — N390 Urinary tract infection, site not specified: Secondary | ICD-10-CM | POA: Insufficient documentation

## 2022-07-13 LAB — RESP PANEL BY RT-PCR (FLU A&B, COVID) ARPGX2
Influenza A by PCR: NEGATIVE
Influenza B by PCR: NEGATIVE
SARS Coronavirus 2 by RT PCR: NEGATIVE

## 2022-07-13 LAB — CBC WITH DIFFERENTIAL/PLATELET
Abs Immature Granulocytes: 0.2 10*3/uL — ABNORMAL HIGH (ref 0.00–0.07)
Basophils Absolute: 0 10*3/uL (ref 0.0–0.1)
Basophils Relative: 0 %
Eosinophils Absolute: 0.2 10*3/uL (ref 0.0–0.5)
Eosinophils Relative: 2 %
HCT: 28.3 % — ABNORMAL LOW (ref 39.0–52.0)
Hemoglobin: 8.7 g/dL — ABNORMAL LOW (ref 13.0–17.0)
Immature Granulocytes: 2 %
Lymphocytes Relative: 7 %
Lymphs Abs: 0.7 10*3/uL (ref 0.7–4.0)
MCH: 28.7 pg (ref 26.0–34.0)
MCHC: 30.7 g/dL (ref 30.0–36.0)
MCV: 93.4 fL (ref 80.0–100.0)
Monocytes Absolute: 0.8 10*3/uL (ref 0.1–1.0)
Monocytes Relative: 9 %
Neutro Abs: 7.7 10*3/uL (ref 1.7–7.7)
Neutrophils Relative %: 80 %
Platelets: 221 10*3/uL (ref 150–400)
RBC: 3.03 MIL/uL — ABNORMAL LOW (ref 4.22–5.81)
RDW: 16.5 % — ABNORMAL HIGH (ref 11.5–15.5)
WBC: 9.7 10*3/uL (ref 4.0–10.5)
nRBC: 0 % (ref 0.0–0.2)

## 2022-07-13 LAB — COMPREHENSIVE METABOLIC PANEL
ALT: 12 U/L (ref 0–44)
AST: 13 U/L — ABNORMAL LOW (ref 15–41)
Albumin: 2.8 g/dL — ABNORMAL LOW (ref 3.5–5.0)
Alkaline Phosphatase: 50 U/L (ref 38–126)
Anion gap: 6 (ref 5–15)
BUN: 21 mg/dL (ref 8–23)
CO2: 27 mmol/L (ref 22–32)
Calcium: 9.1 mg/dL (ref 8.9–10.3)
Chloride: 105 mmol/L (ref 98–111)
Creatinine, Ser: 0.8 mg/dL (ref 0.61–1.24)
GFR, Estimated: >60 mL/min (ref >60)
Glucose, Bld: 118 mg/dL — ABNORMAL HIGH (ref 70–99)
Potassium: 4.2 mmol/L (ref 3.5–5.1)
Sodium: 138 mmol/L (ref 135–145)
Total Bilirubin: 0.8 mg/dL (ref 0.3–1.2)
Total Protein: 6 g/dL — ABNORMAL LOW (ref 6.5–8.1)

## 2022-07-13 LAB — URINALYSIS, ROUTINE W REFLEX MICROSCOPIC
Bilirubin Urine: NEGATIVE
Glucose, UA: NEGATIVE mg/dL
Hgb urine dipstick: NEGATIVE
Ketones, ur: NEGATIVE mg/dL
Leukocytes,Ua: NEGATIVE
Nitrite: NEGATIVE
Protein, ur: NEGATIVE mg/dL
Specific Gravity, Urine: 1.016 (ref 1.005–1.030)
pH: 8 (ref 5.0–8.0)

## 2022-07-13 LAB — BRAIN NATRIURETIC PEPTIDE: B Natriuretic Peptide: 58 pg/mL (ref 0.0–100.0)

## 2022-07-13 LAB — SARS CORONAVIRUS 2 BY RT PCR: SARS Coronavirus 2 by RT PCR: NEGATIVE

## 2022-07-13 MED ORDER — PREGABALIN 75 MG PO CAPS
75.0000 mg | ORAL_CAPSULE | Freq: Two times a day (BID) | ORAL | Status: DC
  Administered 2022-07-13 – 2022-07-16 (×6): 75 mg via ORAL
  Filled 2022-07-13 (×6): qty 1

## 2022-07-13 MED ORDER — ESCITALOPRAM OXALATE 10 MG PO TABS
20.0000 mg | ORAL_TABLET | Freq: Every day | ORAL | Status: DC
  Administered 2022-07-13 – 2022-07-16 (×4): 20 mg via ORAL
  Filled 2022-07-13 (×5): qty 2

## 2022-07-13 MED ORDER — ATORVASTATIN CALCIUM 40 MG PO TABS
40.0000 mg | ORAL_TABLET | Freq: Every day | ORAL | Status: DC
  Administered 2022-07-13 – 2022-07-16 (×4): 40 mg via ORAL
  Filled 2022-07-13 (×4): qty 1

## 2022-07-13 MED ORDER — ZOLPIDEM TARTRATE 5 MG PO TABS: 5.0000 mg | ORAL_TABLET | Freq: Every evening | ORAL | Status: DC | PRN

## 2022-07-13 MED ORDER — FUROSEMIDE 40 MG PO TABS: 20.0000 mg | ORAL_TABLET | ORAL | Status: DC

## 2022-07-13 MED ORDER — FERROUS SULFATE 325 (65 FE) MG PO TABS
325.0000 mg | ORAL_TABLET | ORAL | Status: DC
  Administered 2022-07-14 – 2022-07-16 (×2): 325 mg via ORAL
  Filled 2022-07-13 (×2): qty 1

## 2022-07-13 MED ORDER — FINASTERIDE 5 MG PO TABS
5.0000 mg | ORAL_TABLET | Freq: Every day | ORAL | Status: DC
  Administered 2022-07-14 – 2022-07-15 (×2): 5 mg via ORAL
  Filled 2022-07-13 (×2): qty 1

## 2022-07-13 MED ORDER — VITAMIN D 25 MCG (1000 UNIT) PO TABS
1000.0000 [IU] | ORAL_TABLET | Freq: Every day | ORAL | Status: DC
  Administered 2022-07-13 – 2022-07-16 (×4): 1000 [IU] via ORAL
  Filled 2022-07-13 (×4): qty 1

## 2022-07-13 MED ORDER — FUROSEMIDE 40 MG PO TABS
20.0000 mg | ORAL_TABLET | ORAL | Status: DC
Start: 2022-07-14 — End: 2022-07-16
  Administered 2022-07-14 – 2022-07-16 (×2): 20 mg via ORAL
  Filled 2022-07-13 (×3): qty 1

## 2022-07-13 MED ORDER — VITAMIN B-12 1000 MCG PO TABS
1000.0000 ug | ORAL_TABLET | Freq: Every day | ORAL | Status: DC
  Administered 2022-07-13 – 2022-07-16 (×4): 1000 ug via ORAL
  Filled 2022-07-13 (×4): qty 1

## 2022-07-13 MED ORDER — TOPIRAMATE 25 MG PO TABS
25.0000 mg | ORAL_TABLET | Freq: Two times a day (BID) | ORAL | Status: DC
  Administered 2022-07-13 – 2022-07-16 (×6): 25 mg via ORAL
  Filled 2022-07-13 (×6): qty 1

## 2022-07-13 NOTE — ED Notes (Signed)
Pt refused dinner

## 2022-07-13 NOTE — ED Triage Notes (Signed)
Pt to ED via EMS from home c/o generalized weakness . Apparently pt recently discharged from hospital For sepsis and went to rehab after hospitalization.  Last VS:  99.1, 119/82, p 78. No Medications given by EMS.  Lung sounds Crackles noted by EMS. Reports productive cough yellow sputum. Home o2 user

## 2022-07-13 NOTE — ED Provider Notes (Signed)
[facility] EMERGENCY DEPARTMENT Provider Note   CSN: [id number] Arrival date & time: 07/13/22  1246     History  Chief Complaint  Patient presents with   Weakness   Cough     Jared Bridges is a 88 y.o. male.   Weakness Associated symptoms: cough   Cough    Patient with medical history of essential hypertension, stroke, cardiac murmur, polymyalgia rheumatica, recent hospital admission due to encephalopathy secondary to sepsis from UTI and aspiration pneumonia presents today due to general weakness and decline.    Patient was hospitalized in July, went through nursing rehab and discharged home two days ago.  Patient's daughter is actually a [employer] of social services where they live in [place].  Starting yesterday patient has been generally very weak, since returning home from hospital is unable to sit up in the bed and get himself onto motorized wheel chair.  Today he just wanted to sleep and would not get out of bed.  Reports he is having a productive cough with yellow sputum.  Daughter was unable to get him out of bed today, called EMS.  No falls, seizures, change in mental baseline.  2L O2 baseline post sepsis  Able to stand and go to motor chair at D/C from SNF.  Home Medications Prior to Admission medications   Medication Sig Start Date End Date Taking? Authorizing Provider  acetaminophen (TYLENOL) 325 MG tablet Take 2 tablets (650 mg total) by mouth 3 (three) times daily. 07/25/21  Yes Love, Ivan Anchors, PA-C  atorvastatin (LIPITOR) 40 MG tablet Take 1 tablet (40 mg total) by mouth daily. 07/09/22  Yes Gerlene Fee, NP  Calcium Carb-Cholecalciferol (CALCIUM 500/D) 500-10 MG-MCG CHEW Take one by mouth twice daily.   Yes [provider]  Cholecalciferol (VITAMIN D3) 250 MCG (10000 UT) TABS Take 1,000 Units by mouth daily.   Yes [provider]  diclofenac sodium (VOLTAREN) 1 % GEL Apply 2 g topically daily as needed (for pain). 11/22/15  Yes Dohmeier,  Asencion Partridge, MD  escitalopram (LEXAPRO) 20 MG tablet Take 1 tablet (20 mg total) by mouth daily. 07/09/22  Yes Gerlene Fee, NP  ferrous sulfate 325 (65 FE) MG tablet Take 1 tablet (325 mg total) by mouth 3 (three) times a week. [place], wed, fri 07/09/22  Yes Gerlene Fee, NP  finasteride (PROSCAR) 5 MG tablet Take 1 tablet (5 mg total) by mouth daily. 07/09/22  Yes Gerlene Fee, NP  furosemide (LASIX) 20 MG tablet Take 1 tablet (20 mg total) by mouth every other day. 07/09/22 08/08/22 Yes Gerlene Fee, NP  ibuprofen (ADVIL) 200 MG tablet Take 400-600 mg by mouth every 6 (six) hours as needed for moderate pain.   Yes [provider]  omeprazole (PRILOSEC) 40 MG capsule Take 1 capsule (40 mg total) by mouth daily. 07/09/22  Yes Gerlene Fee, NP  pregabalin (LYRICA) 75 MG capsule Take 1 capsule (75 mg total) by mouth 2 (two) times daily. 07/09/22  Yes Gerlene Fee, NP  topiramate (TOPAMAX) 50 MG tablet Take 0.5 tablets (25 mg total) by mouth 2 (two) times daily. 07/09/22  Yes Gerlene Fee, NP  vitamin B-12 (CYANOCOBALAMIN) 500 MCG tablet Take 1,000 mcg by mouth daily.   Yes [provider]  zolpidem (AMBIEN) 5 MG tablet Take 5-10 mg by mouth at bedtime as needed. 07/09/22  Yes [provider]      Allergies    Iodine, Calan [verapamil], Cymbalta [duloxetine hcl],  Iodinated contrast media, Latex, Lisinopril, Neurontin [gabapentin], Zoloft [sertraline hcl], Valium [diazepam], Flomax [tamsulosin hcl], Glimepiride, Melatonin, and Tape    Review of Systems   Review of Systems  Respiratory:  Positive for cough.   Neurological:  Positive for weakness.    Physical Exam Updated Vital Signs BP (!) 116/58   Pulse 83   Temp 98.3 F (36.8 C) (Oral)   Resp (!) 25   Ht '6\' 1"'$  (1.854 m)   Wt 80.7 kg Comment: per record  SpO2 98%   BMI 23.48 kg/m  Physical Exam Vitals and nursing note reviewed. Exam conducted with a chaperone present.  Constitutional:      Appearance:  Normal appearance.  HENT:     Head: Normocephalic and atraumatic.  Eyes:     General: No scleral icterus.       Right eye: No discharge.        Left eye: No discharge.     Extraocular Movements: Extraocular movements intact.     Pupils: Pupils are equal, round, and reactive to light.  Cardiovascular:     Rate and Rhythm: Normal rate and regular rhythm.     Pulses: Normal pulses.     Heart sounds: Murmur heard.     No friction rub. No gallop.  Pulmonary:     Effort: Pulmonary effort is normal. No respiratory distress.     Breath sounds: Normal breath sounds.  Abdominal:     General: Abdomen is flat. Bowel sounds are normal. There is no distension.     Palpations: Abdomen is soft.     Tenderness: There is no abdominal tenderness.  Musculoskeletal:        General: Normal range of motion.     Comments: Moving upper and lower extremities.  Skin:    General: Skin is warm and dry.     Coloration: Skin is not jaundiced.  Neurological:     Mental Status: He is alert. Mental status is at baseline.     Coordination: Coordination normal.     Comments: At baseline per daughter who is at bedside.     ED Results / Procedures / Treatments   Labs (all labs ordered are listed, but only abnormal results are displayed) Labs Reviewed  URINALYSIS, ROUTINE W REFLEX MICROSCOPIC - Abnormal; Notable for the following components:      Result Value   APPearance CLOUDY (*)    All other components within normal limits  CBC WITH DIFFERENTIAL/PLATELET - Abnormal; Notable for the following components:   RBC 3.03 (*)    Hemoglobin 8.7 (*)    HCT 28.3 (*)    RDW 16.5 (*)    Abs Immature Granulocytes 0.20 (*)    All other components within normal limits  COMPREHENSIVE METABOLIC PANEL - Abnormal; Notable for the following components:   Glucose, Bld 118 (*)    Total Protein 6.0 (*)    Albumin 2.8 (*)    AST 13 (*)    All other components within normal limits  SARS CORONAVIRUS 2 BY RT PCR  URINE  CULTURE  RESP PANEL BY RT-PCR (FLU A&B, COVID) [id number]  BRAIN NATRIURETIC PEPTIDE    EKG None  Radiology DG Chest Portable 1 View  Result Date: 07/13/2022 CLINICAL DATA:  Productive cough and weakness.  Recent sepsis. EXAM: PORTABLE CHEST 1 VIEW COMPARISON:  06/23/2022 FINDINGS: The heart size and mediastinal contours are within normal limits. Aortic atherosclerotic calcification incidentally noted. Both lungs are clear. The visualized skeletal structures are unremarkable. IMPRESSION:  No active disease. Electronically Signed   By: Marlaine Hind M.D.   On: 07/13/2022 13:50    Procedures Procedures    Medications Ordered in ED Medications  atorvastatin (LIPITOR) tablet 40 mg (has no administration in time range)  Vitamin D3 TABS 1,000 Units (has no administration in time range)  ferrous sulfate tablet 325 mg (has no administration in time range)  finasteride (PROSCAR) tablet 5 mg (has no administration in time range)  escitalopram (LEXAPRO) tablet 20 mg (has no administration in time range)  furosemide (LASIX) tablet 20 mg (has no administration in time range)  pregabalin (LYRICA) capsule 75 mg (has no administration in time range)  topiramate (TOPAMAX) tablet 25 mg (has no administration in time range)  cyanocobalamin (VITAMIN B12) tablet 1,000 mcg (has no administration in time range)  zolpidem (AMBIEN) tablet 5-10 mg (has no administration in time range)    ED Course/ Medical Decision Making/ A&P                           Medical Decision Making Amount and/or Complexity of Data Reviewed Labs: ordered. Radiology: ordered.  Risk OTC drugs. Prescription drug management.   Patient presents due to generalized weakness and cough.  Differential includes not limited to ACS, pneumonia, electrolyte derangement, AKI, sepsis, UTI, generalized failure to thrive post discharge.  History is from I merrily received to patient's daughter.  Also reviewed external records and EMS report.    I ordered and reviewed laboratory work-up.  Per my interpretation: -No leukocytosis, baseline anemia with a hemoglobin of 8.7.   -CMP is without any gross electrolyte derangement or AKI, patient is hyperglycemic but no signs of endorgan damage or transaminitis.   -BNP is within normal limits.   -UA without infection.  EKG without any signs of ischemia or underlying arrhythmia.1st degree AVB. Patient is on cardiac monitoring is in sinus rhythm per my interpretation.  I ordered and viewed plain film.  Per my interpretation, chest x-ray is without any acute process, underlying pneumonia, pneumothorax.  Agree with radiologist interpretation.  Social determinants of health: Patient was discharged home to his daughter 2 days ago.  She is not able to take care of him and is doing the best she can.  States they do have a home health person is coming once a week again for the first time yesterday but family especially the daughter is not able to take care of him on the other days.  She is requesting help with skilled nursing or additional rehab to help with his gait problems.  I do not have a reason based on work-up today to admit the patient to hospitalist service.  Patient has had a general decline since his recent hospitalization and is too weak to care for himself at home.  Family is unable to take care of him and they are needing more medical assistance.    I have put in transition of care consult for social work help as well as PT OT evaluation in the morning. Face to face ordered.  I have reordered home medicine.  Family understands the plan.  Impression-failure to thrive/additional medical support needs  Discussed HPI, physical exam and plan of care for this patient with attending Garnette Gunner. The attending physician evaluated this patient as part of a shared visit and agrees with plan of care.         Final Clinical Impression(s) / ED Diagnoses Final diagnoses:  None  Rx /  DC Orders ED Discharge Orders     None         Sherrill Raring, Hershal Coria 07/13/22 1522    Cristie Hem, MD 07/14/22 [phone]

## 2022-07-14 MED ORDER — PANTOPRAZOLE SODIUM 40 MG PO TBEC
40.0000 mg | DELAYED_RELEASE_TABLET | Freq: Every day | ORAL | Status: DC
  Administered 2022-07-14 – 2022-07-16 (×3): 40 mg via ORAL
  Filled 2022-07-14 (×3): qty 1

## 2022-07-14 NOTE — ED Notes (Signed)
Pt resting in bed. Awake on morning rounds.  Assisted in repositioning. Denies any needs including pain. Purewick in place. Call bell within reach.

## 2022-07-14 NOTE — NC FL2 (Signed)
[place] LEVEL OF CARE SCREENING TOOL     IDENTIFICATION  Patient Name: Jared Bridges Birthdate: 07-18-1930 Sex: male Admission Date (Current Location): 07/13/2022  [facility] and [place] Number:  [employer] and Address:  [facility] [address] [place]      Provider Number: [id number]  Attending Physician Name and Address:  Default, Provider, MD  Relative Name and Phone Number:  Jared Bridges - [phone]    Current Level of Care: Hospital Recommended Level of Care: [place] Prior Approval Number:    Date Approved/Denied:   PASRR Number: [id number]  Discharge Plan: SNF    Current Diagnoses: Patient Active Problem List   Diagnosis Date Noted   Acute on chronic anemia 07/01/2022   Grade I diastolic dysfunction 42/70/6237   Hyperlipidemia associated with type 2 diabetes mellitus ([place]) 06/26/2022   Hypertension associated with type 2 diabetes mellitus ([place]) 06/26/2022   History of CVA (cerebrovascular accident) 06/26/2022   Vascular dementia without behavioral disturbance ([place]) 06/26/2022   Neurodegenerative cognitive impairment ([place]) 06/26/2022   Normochromic normocytic anemia    Septic shock ([place]) 06/16/2022   Lactic acidosis 06/16/2022   Mixed hyperlipidemia 06/16/2022   Chronic diastolic CHF (congestive heart failure) ([place]) 06/16/2022   GERD (gastroesophageal reflux disease) 06/16/2022   Sepsis due to undetermined organism ([place])    Loss of weight 03/10/2022   History of peptic ulcer disease 09/03/2021   Wheelchair dependence 09/02/2021   Quadriplegia ([place]) 09/02/2021   Sleep disturbance    Acute blood loss anemia    Controlled type 2 diabetes mellitus with hyperglycemia, without long-term current use of insulin ([place])    Central cord syndrome at C7 level of cervical spinal cord ([name] 07/05/2021   Epidural hematoma ([name] 07/02/2021   Lower urinary tract symptoms (LUTS) 10/31/2020   Prostate  cancer ([place]) 06/20/2020   Elevated PSA 04/18/2020   Cobalamin deficiency 02/22/2020   Intractable chronic migraine without aura 02/20/2020   Altered mental status 02/20/2020   Asthenia 02/20/2020   Diabetic peripheral neuropathy ([place]) 02/20/2020   Obstructive sleep apnea of adult 02/20/2020   Medicare annual wellness visit, subsequent 12/07/2019   Depressed mood 12/07/2019   Vitamin D deficiency 10/25/2019   Memory loss 10/25/2019   Fall 10/25/2019   Nonrheumatic aortic valve stenosis 03/23/2018   Migraine headaches 12/29/2017   TIA (transient ischemic attack) 12/28/2017   Rectal bleeding 11/26/2017   Carpal tunnel syndrome, left 07/22/2017   Acquired trigger finger 07/22/2017   Acute cholecystitis 04/05/2016   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/28/2016   Total knee replacement status 02/28/2016   CVA (cerebral infarction) 12/19/2015   Carotid stenosis 12/19/2015   Postoperative wound dehiscence 07/09/2015   Cellulitis of knee, left 06/29/2015   Wound infection after surgery 06/29/2015   OA (osteoarthritis) of knee 05/28/2015   BRBPR (bright red blood per rectum) 11/21/2014   Orthostatic hypotension 11/21/2014   Dizziness 11/16/2014   Polymyalgia rheumatica ([place]) 09/12/2014   Urinary tract infection with hematuria 08/16/2014   Encephalopathy 08/16/2014   Leukocytosis 08/16/2014   Anemia, iron deficiency 08/16/2014   DJD (degenerative joint disease) 08/16/2014   Essential hypertension    Cataract, nuclear 04/11/2014   Complaining of back-related symptom 03/15/2014   Arthralgia of hip or thigh 03/15/2014   Gonalgia 10/10/2013   Left knee pain 10/10/2013   1st degree AV block 08/04/2013   Infection of the upper respiratory tract 04/05/2013   Cataract 02/24/2013  Pseudoaphakia 02/24/2013   Episode of syncope 01/11/2013   Cardiac murmur 11/09/2012   Near syncope 11/09/2012   Anxiety 06/18/2012   Depression, recurrent ([place]) 06/18/2012   Bleeding  gastrointestinal 06/18/2012   Diverticulitis 06/18/2012   Difficulty hearing 06/18/2012   Adaptive colitis 06/18/2012   Arthritis, degenerative 06/18/2012   Hearing impairment 06/18/2012   Multinodular goiter 01/29/2012   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   Adenoma of large intestine 05/31/2011   Cephalalgia 05/31/2011   Hypercholesterolemia 05/31/2011   Benign prostatic hyperplasia with urinary obstruction 05/31/2011   LBP (low back pain) 05/31/2011   Essential (primary) hypertension 05/31/2011   Headache(784.0) 05/31/2011   Hyperplasia of prostate 05/31/2011    Orientation RESPIRATION BLADDER Height & Weight     Self  O2 (Nasal Cannula - 2L/min) Incontinent Weight: 178 lb (80.7 kg) (per record) Height:  '6\' 1"'$  (185.4 cm)  BEHAVIORAL SYMPTOMS/MOOD NEUROLOGICAL BOWEL NUTRITION STATUS      Incontinent Diet (regular)  AMBULATORY STATUS COMMUNICATION OF NEEDS Skin   Total Care Verbally Skin abrasions (Right hand and left upper arm)                       Personal Care Assistance Level of Assistance  Bathing, Feeding, Dressing Bathing Assistance: Maximum assistance Feeding assistance: Maximum assistance Dressing Assistance: Maximum assistance     Functional Limitations Info  Sight, Hearing, Speech Sight Info: Adequate Hearing Info: Adequate Speech Info: Adequate    SPECIAL CARE FACTORS FREQUENCY  PT (By licensed PT), OT (By licensed OT)     PT Frequency: x5/week OT Frequency: x5/week            Contractures Contractures Info: Not present    Additional Factors Info  Code Status, Allergies, Psychotropic Code Status Info: Full Allergies Info: Iodine, Calan (verapamil),Cymbalta (duloxetine Hcl),Iodinated Contrast Media,Latex,Lisinopril,Neurontin (gabapentin),Zoloft (sertraline Hcl),Valium (diazepam),Flomax (tamsulosin Hcl),Glimepiride,Melatonin, Tape Psychotropic Info: Lexapro         Current Medications (07/14/2022):  This is the current hospital active medication  list Current Facility-Administered Medications  Medication Dose Route Frequency Provider Last Rate Last Admin   atorvastatin (LIPITOR) tablet 40 mg  40 mg Oral Daily Sherrill Raring, PA-C   40 mg at 07/14/22 0919   cholecalciferol (VITAMIN D3) 25 MCG (1000 UNIT) tablet 1,000 Units  1,000 Units Oral Daily Sherrill Raring, PA-C   1,000 Units at 07/14/22 8469   cyanocobalamin (VITAMIN B12) tablet 1,000 mcg  1,000 mcg Oral Daily Sherrill Raring, PA-C   1,000 mcg at 07/14/22 6295   escitalopram (LEXAPRO) tablet 20 mg  20 mg Oral Daily Sherrill Raring, PA-C   20 mg at 07/14/22 0920   ferrous sulfate tablet 325 mg  325 mg Oral Once per day on Mon Wed Fri Sage, Haley, PA-C   325 mg at 07/14/22 0919   finasteride (PROSCAR) tablet 5 mg  5 mg Oral Daily Sherrill Raring, PA-C   5 mg at 07/14/22 2841   furosemide (LASIX) tablet 20 mg  20 mg Oral Britta Mccreedy, PA-C   20 mg at 07/14/22 3244   pregabalin (LYRICA) capsule 75 mg  75 mg Oral BID Sherrill Raring, PA-C   75 mg at 07/14/22 0919   topiramate (TOPAMAX) tablet 25 mg  25 mg Oral BID Sherrill Raring, PA-C   25 mg at 07/14/22 0920   zolpidem (AMBIEN) tablet 5 mg  5 mg Oral QHS PRN Sherrill Raring, PA-C       Current Outpatient Medications  Medication Sig Dispense Refill   acetaminophen (  TYLENOL) 325 MG tablet Take 2 tablets (650 mg total) by mouth 3 (three) times daily.     atorvastatin (LIPITOR) 40 MG tablet Take 1 tablet (40 mg total) by mouth daily. 30 tablet 0   Calcium Carb-Cholecalciferol (CALCIUM 500/D) 500-10 MG-MCG CHEW Take one by mouth twice daily.     Cholecalciferol (VITAMIN D3) 250 MCG (10000 UT) TABS Take 1,000 Units by mouth daily.     diclofenac sodium (VOLTAREN) 1 % GEL Apply 2 g topically daily as needed (for pain). 2 g 1   escitalopram (LEXAPRO) 20 MG tablet Take 1 tablet (20 mg total) by mouth daily. 30 tablet 0   ferrous sulfate 325 (65 FE) MG tablet Take 1 tablet (325 mg total) by mouth 3 (three) times a week. Mon, wed, fri 12 tablet 0   finasteride  (PROSCAR) 5 MG tablet Take 1 tablet (5 mg total) by mouth daily. 30 tablet 0   furosemide (LASIX) 20 MG tablet Take 1 tablet (20 mg total) by mouth every other day. 15 tablet 0   ibuprofen (ADVIL) 200 MG tablet Take 400-600 mg by mouth every 6 (six) hours as needed for moderate pain.     omeprazole (PRILOSEC) 40 MG capsule Take 1 capsule (40 mg total) by mouth daily. 30 capsule 0   pregabalin (LYRICA) 75 MG capsule Take 1 capsule (75 mg total) by mouth 2 (two) times daily. 60 capsule 0   topiramate (TOPAMAX) 50 MG tablet Take 0.5 tablets (25 mg total) by mouth 2 (two) times daily. 30 tablet 0   vitamin B-12 (CYANOCOBALAMIN) 500 MCG tablet Take 1,000 mcg by mouth daily.     zolpidem (AMBIEN) 5 MG tablet Take 5-10 mg by mouth at bedtime as needed.       Discharge Medications: Please see discharge summary for a list of discharge medications.  Relevant Imaging Results:  Relevant Lab Results:   Additional Information SSN: [id number]  Kimber Relic, LCSW

## 2022-07-14 NOTE — ED Notes (Signed)
NT informed me every time her and the other NT turned pt to change linens, pt received a skin tear on each arm. Skin tear to R hand and skin tear to L upper arm. This RN placed skin back in place and covered area with tefla bandage and paper tape

## 2022-07-14 NOTE — ED Notes (Signed)
Physical Therapy at bedside.

## 2022-07-14 NOTE — ED Notes (Signed)
Per physical therapy, pt is maximum assistance and recommends rehab

## 2022-07-14 NOTE — Plan of Care (Signed)
  Problem: Acute Rehab PT Goals(only PT should resolve) Goal: Pt Will Go Supine/Side To Sit Outcome: Progressing Flowsheets (Taken 07/14/2022 1145) Pt will go Supine/Side to Sit: with moderate assist Goal: Pt Will Go Sit To Supine/Side Outcome: Progressing Flowsheets (Taken 07/14/2022 1145) Pt will go Sit to Supine/Side:  with minimal assist  with moderate assist Goal: Patient Will Transfer Sit To/From Stand Outcome: Progressing Flowsheets (Taken 07/14/2022 1145) Patient will transfer sit to/from stand: with moderate assist Goal: Pt Will Transfer Bed To Chair/Chair To Bed Outcome: Progressing Flowsheets (Taken 07/14/2022 1145) Pt will Transfer Bed to Chair/Chair to Bed:  with mod assist  with max assist Goal: Pt Will Ambulate Outcome: Progressing Flowsheets (Taken 07/14/2022 1145) Pt will Ambulate:  10 feet  with rolling walker  with moderate assist   11:46 AM, 07/14/22 Lonell Grandchild, MPT Physical Therapist with [facility] [phone] office [phone] mobile phone

## 2022-07-14 NOTE — ED Notes (Signed)
Stage one blanchable area on sacrum, sacral patch applied, pt repositioned

## 2022-07-14 NOTE — Evaluation (Signed)
Physical Therapy Evaluation Patient Details Name: Jared Bridges MRN: [id number] DOB: [birth year] Today's Date: 07/14/2022  History of Present Illness  Jared Bridges is a 88 y.o. male, Patient with medical history of essential hypertension, stroke, cardiac murmur, polymyalgia rheumatica, recent hospital admission due to encephalopathy secondary to sepsis from UTI and aspiration pneumonia presents today due to general weakness and decline.       Patient was hospitalized in July, went through nursing rehab and discharged home two days ago.  Patient's daughter is actually a [employer] of social services where they live in [place].  Starting yesterday patient has been generally very weak, since returning home from hospital is unable to sit up in the bed and get himself onto motorized wheel chair.  Today he just wanted to sleep and would not get out of bed.  Reports he is having a productive cough with yellow sputum.  Daughter was unable to get him out of bed today, called EMS.  No falls, seizures, change in mental baseline.   Clinical Impression  Patient demonstrates slow labored movement for sitting up at bedside requiring Max assist due to generalized weakness, limited to partially lifting bottom off bed with Max assist during attempts to sit to stand secondary to BLE weakness and poor standing balance.  Patient required Mod/max assist to reposition when put back to bed.  Patient will benefit from continued skilled physical therapy in hospital and recommended venue below to increase strength, balance, endurance for safe ADLs and gait.         Recommendations for follow up therapy are one component of a multi-disciplinary discharge planning process, led by the attending physician.  Recommendations may be updated based on patient status, additional functional criteria and insurance authorization.  Follow Up Recommendations Skilled nursing-short term rehab (<3 hours/day) Can patient physically be  transported by private vehicle: No    Assistance Recommended at Discharge Intermittent Supervision/Assistance  Patient can return home with the following  A lot of help with walking and/or transfers;A lot of help with bathing/dressing/bathroom;Assistance with cooking/housework;Help with stairs or ramp for entrance    Equipment Recommendations None recommended by PT  Recommendations for Other Services       Functional Status Assessment Patient has had a recent decline in their functional status and demonstrates the ability to make significant improvements in function in a reasonable and predictable amount of time.     Precautions / Restrictions Precautions Precautions: Fall Restrictions Weight Bearing Restrictions: No      Mobility  Bed Mobility Overal bed mobility: Needs Assistance Bed Mobility: Supine to Sit, Sit to Supine     Supine to sit: Max assist Sit to supine: Mod assist, Max assist        Transfers Overall transfer level: Needs assistance Equipment used: Rolling walker (2 wheels) Transfers: Sit to/from Stand Sit to Stand: Max assist           General transfer comment: Max to partially stand due to BLE weakness    Ambulation/Gait                  Stairs            Wheelchair Mobility    Modified Rankin (Stroke Patients Only)       Balance Overall balance assessment: Needs assistance Sitting-balance support: Feet supported, No upper extremity supported Sitting balance-Leahy Scale: Fair Sitting balance - Comments: seated at EOB   Standing balance support: Reliant on assistive device for balance, During functional activity,  Bilateral upper extremity supported Standing balance-Leahy Scale: Poor Standing balance comment: using RW                             Pertinent Vitals/Pain Pain Assessment Pain Assessment: No/denies pain    Home Living Family/patient expects to be discharged to:: Private residence Living  Arrangements: Spouse/significant other;Children Available Help at Discharge: Family;Available 24 hours/day Type of Home: House Home Access: Stairs to enter Entrance Stairs-Rails: Can reach both Entrance Stairs-Number of Steps: 5 Alternate Level Stairs-Number of Steps: 17 Home Layout: Two level;Bed/bath upstairs Home Equipment: Rollator (4 wheels);Rolling Walker (2 wheels);Shower seat Additional Comments: Patient is poor historian, info taken from prior admission    Prior Function Prior Level of Function : Needs assist       Physical Assist : Mobility (physical);ADLs (physical) Mobility (physical): Bed mobility;Transfers;Gait;Stairs   Mobility Comments: Mod Indep transfers, uses power w/c for mobioity ADLs Comments: assisted by family     Hand Dominance   Dominant Hand: Right    Extremity/Trunk Assessment   Upper Extremity Assessment Upper Extremity Assessment: Generalized weakness    Lower Extremity Assessment Lower Extremity Assessment: Generalized weakness    Cervical / Trunk Assessment Cervical / Trunk Assessment: Kyphotic  Communication   Communication: HOH  Cognition Arousal/Alertness: Awake/alert Behavior During Therapy: WFL for tasks assessed/performed Overall Cognitive Status: Within Functional Limits for tasks assessed                                          General Comments      Exercises     Assessment/Plan    PT Assessment Patient needs continued PT services  PT Problem List Decreased strength;Decreased activity tolerance;Decreased balance;Decreased mobility       PT Treatment Interventions DME instruction;Gait training;Stair training;Functional mobility training;Therapeutic activities;Therapeutic exercise;Balance training;Patient/family education;Wheelchair mobility training    PT Goals (Current goals can be found in the Care Plan section)  Acute Rehab PT Goals Patient Stated Goal: return home after rehab PT Goal  Formulation: With patient Time For Goal Achievement: 07/28/22 Potential to Achieve Goals: Fair    Frequency Min 2X/week     Co-evaluation               AM-PAC PT "6 Clicks" Mobility  Outcome Measure Help needed turning from your back to your side while in a flat bed without using bedrails?: A Lot Help needed moving from lying on your back to sitting on the side of a flat bed without using bedrails?: A Lot Help needed moving to and from a bed to a chair (including a wheelchair)?: Total Help needed standing up from a chair using your arms (e.g., wheelchair or bedside chair)?: Total Help needed to walk in hospital room?: Total Help needed climbing 3-5 steps with a railing? : Total 6 Click Score: 8    End of Session Equipment Utilized During Treatment: Oxygen Activity Tolerance: Patient tolerated treatment well;Patient limited by fatigue Patient left: in bed;with call bell/phone within reach Nurse Communication: Mobility status PT Visit Diagnosis: Unsteadiness on feet (R26.81);Other abnormalities of gait and mobility (R26.89);Muscle weakness (generalized) (M62.81)    Time: 9562-1308 PT Time Calculation (min) (ACUTE ONLY): 23 min   Charges:   PT Evaluation $PT Eval Moderate Complexity: 1 Mod PT Treatments $Therapeutic Activity: 23-37 mins        11:43 AM, 07/14/22 Jeneen Rinks  Chrissie Noa, MPT Physical Therapist with [facility] 336 [phone] office [phone] mobile phone

## 2022-07-14 NOTE — Progress Notes (Addendum)
TOC will begin FL2 process once PT eval is complete.   FL2 is complete and faxed out. Pt and family accepted bed offer at [facility]. Josem Kaufmann is pending. Daughter, [name], is prepared to private pay if Josem Kaufmann is not approved. CSW has spoken to [facility] and she is aware.

## 2022-07-15 LAB — URINE CULTURE: Culture: 40000 — AB

## 2022-07-15 MED ORDER — AMOXICILLIN-POT CLAVULANATE 875-125 MG PO TABS
1.0000 | ORAL_TABLET | Freq: Two times a day (BID) | ORAL | Status: DC
  Administered 2022-07-15 – 2022-07-16 (×2): 1 via ORAL
  Filled 2022-07-15 (×2): qty 1

## 2022-07-15 NOTE — ED Notes (Signed)
Gave patient evening meds and rolled him to the rt side

## 2022-07-15 NOTE — ED Notes (Signed)
Full pt bed change, new purewick placed!

## 2022-07-15 NOTE — ED Provider Notes (Signed)
Evaluation for peer to peer conversation.  Patient admitted to the ED on 07/13/2022, for inability to be managed at home.  He is debilitated and weak and requires help continuously.  Discharge from the hospital on 06/24/2022.  Was admitted at that time for encephalopathy and urinary tract infection.  Treated for shock, complicated by heart failure.  Apparently was transferred to a nursing home, and was discharged, on 07/11/2022.  Since I was in the ED, 2 days ago he has been evaluated for acute medical conditions and found not to have an admittable diagnosis.  He was seen by PT who suggest requirement for placement in a skilled nursing facility for management of his inability to care for self.  He requires maximal assist for transfer and is unable to participate in his activities of daily living.  Daughter and wife in room with patient now, they attempted to help him after rehab discharge 4 days ago initially they were okay but the next day he began to have increased weakness and inability to assist and they were unable to help him.  They decided to bring him to the ED after home health was there and told them they were concerned about his coughing.  Phone number to call: [phone], Select option 5 - Please have accessible the pt's full name, DOB: ([id number]), and his Member/Subscriber ID: [id number].   After the peer to peer discussion, the petition for short-term rehab was denied.  Recommendations are to continue with current treatment at home, get increased in-home health, and consider petition for long-term placement.   Daleen Bo, MD 07/15/22 1600  Started on Augmentin (07/15/22) for positive urine culture and weakness. He does not have indwelling foley. Needs Rx if D/Ced      Daleen Bo, MD 07/16/22 1118

## 2022-07-15 NOTE — ED Notes (Signed)
Rolled patient to left side, no complaints at this time.

## 2022-07-15 NOTE — Progress Notes (Addendum)
CSW has spoken with Marianna Fuss from [facility]. Marianna Fuss confirmed that they are holding a bed for Mr.Kirkey. CSW informed Marianna Fuss that [employer] is still pending. Marianna Fuss shared that if the family chooses to bring the pt before the Josem Kaufmann is approved, they will be private pay.   This CSW is contacting [place] to follow up on pending Auth.   [facility] confirmed that the Josem Kaufmann is still pending but is expecting there will be an answer some time today.CSW contacted the pt's daughter, [name], and informed her of the status of the Auth. [name] is choosing to wait until the Josem Kaufmann is approved or denied before choosing to private pay. This CSW contacted Kerri via phone @ 9:43 AM and left a voicemail.

## 2022-07-15 NOTE — ED Notes (Signed)
Pt care taken, pt is resting and family is leaving for the night.

## 2022-07-15 NOTE — Progress Notes (Signed)
This CSW has contacted the daughter, [name], to inform them of denial for authorization. The family is choosing to think overnight about whether to move forward with private pay. [name] will follow up with CSW and Marianna Fuss at [facility] in the morning.   Ulysees Barns, MSW, [email or web address]

## 2022-07-15 NOTE — Progress Notes (Signed)
This CSW was contacted by Bernadene Bell @ 1:36 PM to be informed of the Auth requiring a Peer to Peer before proceeding and that it must be completed by Wednesday, 8/16 at 9 AM.   This CSW contacted the Attending, Dr. Eulis Foster '@1'$ :48 PM to ask if he could complete the Peer to Peer. Dr. Eulis Foster informed this CSW that he can complete it after 3PM. This CSW provided Dr.Wentz with the information needed for the Peer to Peer.   Contact #: [phone], option 5 Name, DOB, and Member/Subscriber ID  CSW also reached out to pt's daughter, [name], and Marianna Fuss at [facility] to inform of this update.

## 2022-07-16 MED ORDER — AMOXICILLIN-POT CLAVULANATE 875-125 MG PO TABS: 1.0000 | ORAL_TABLET | Freq: Two times a day (BID) | ORAL | 0 refills | Status: DC

## 2022-07-16 NOTE — Progress Notes (Addendum)
Judson Roch, from [place] [phone]), called this CSW to request that orders for [facility] Nursing and PT can be put in. Sarah informed this CSW that they have received authorization from the [place] to cover [facility] RN and PT.  Information forwarded to [place], [place].   Ulysees Barns, MSW, [email or web address]

## 2022-07-16 NOTE — ED Notes (Signed)
RN and NT gave pt a bed bath., clean sheet, gown, put on new malewick and gave pt warm blankets

## 2022-07-16 NOTE — ED Notes (Signed)
Breakfast meal given to pt. Pt states " Morning, I will get up shortly to eat." This nurse tech showed and explained to pt how to use call bell and when ready to use call bell to alert staff to assist with setting up for meal tray. Nurse notified.

## 2022-07-16 NOTE — ED Notes (Signed)
Skin tears cleaned and new dressing applied.

## 2022-07-16 NOTE — ED Notes (Signed)
Jared Bridges called requested rx for abt for uti. RX sent to pharmacy for 3 more pills.  07/16/2022  1508

## 2022-07-16 NOTE — ED Notes (Signed)
Jared Bridges aware of EMS here to transport the pt home.

## 2022-07-16 NOTE — ED Notes (Signed)
CSW spoke with pts daughter who states that she is planning for pt to return to the home. If she is unable to care for him CSW explained reaching out to pts PCP and speaking with [facility] as they have been working with them. At this time [facility] has been set up for PT and RN services with Elliot Cousin. CSW updated [place] of plan for D/C today.   Pts daughter concerned about pts home O2. Currently pt has only tank and needs a portable. Pts daughter was told by DME company that she needed the orders for the tanks D/C'd. CSW spoke to [place] who states he will work on this with the office and get portables delivered to pts home.   CSW updated pts RN's that pt can D/C home via EMS when they are ready.

## 2022-07-16 NOTE — ED Notes (Cosign Needed)
Patient was discharged home.  He has 3 pills left of his Augmentin that he needs to take.  I will fill 3 pills of the 875 Augmentin for urinary tract infection.   Myna Bright [place], [place] 07/16/22 1505

## 2022-08-08 ENCOUNTER — Encounter (HOSPITAL_COMMUNITY): Payer: Self-pay

## 2022-08-08 ENCOUNTER — Inpatient Hospital Stay (HOSPITAL_COMMUNITY)
Admission: EM | Admit: 2022-08-08 | Discharge: 2022-08-15 | DRG: 377 | Disposition: A | Discharge status: Home Health | Payer: Medicare Other | Attending: Family Medicine | Admitting: Family Medicine

## 2022-08-08 ENCOUNTER — Other Ambulatory Visit: Payer: Self-pay

## 2022-08-08 ENCOUNTER — Emergency Department (HOSPITAL_COMMUNITY): Payer: Medicare Other

## 2022-08-08 DIAGNOSIS — I5189 Other ill-defined heart diseases: Secondary | ICD-10-CM

## 2022-08-08 DIAGNOSIS — L8990 Pressure ulcer of unspecified site, unspecified stage: Secondary | ICD-10-CM | POA: Insufficient documentation

## 2022-08-08 DIAGNOSIS — E1165 Type 2 diabetes mellitus with hyperglycemia: Secondary | ICD-10-CM | POA: Diagnosis present

## 2022-08-08 DIAGNOSIS — Z8711 Personal history of peptic ulcer disease: Secondary | ICD-10-CM

## 2022-08-08 DIAGNOSIS — M353 Polymyalgia rheumatica: Secondary | ICD-10-CM | POA: Diagnosis present

## 2022-08-08 DIAGNOSIS — L89322 Pressure ulcer of left buttock, stage 2: Secondary | ICD-10-CM | POA: Diagnosis present

## 2022-08-08 DIAGNOSIS — J9811 Atelectasis: Secondary | ICD-10-CM

## 2022-08-08 DIAGNOSIS — Z8546 Personal history of malignant neoplasm of prostate: Secondary | ICD-10-CM

## 2022-08-08 DIAGNOSIS — Z8673 Personal history of transient ischemic attack (TIA), and cerebral infarction without residual deficits: Secondary | ICD-10-CM

## 2022-08-08 DIAGNOSIS — E1142 Type 2 diabetes mellitus with diabetic polyneuropathy: Secondary | ICD-10-CM | POA: Diagnosis present

## 2022-08-08 DIAGNOSIS — Z87891 Personal history of nicotine dependence: Secondary | ICD-10-CM

## 2022-08-08 DIAGNOSIS — K922 Gastrointestinal hemorrhage, unspecified: Secondary | ICD-10-CM | POA: Diagnosis present

## 2022-08-08 DIAGNOSIS — D649 Anemia, unspecified: Secondary | ICD-10-CM

## 2022-08-08 DIAGNOSIS — Z91048 Other nonmedicinal substance allergy status: Secondary | ICD-10-CM

## 2022-08-08 DIAGNOSIS — L89312 Pressure ulcer of right buttock, stage 2: Secondary | ICD-10-CM | POA: Diagnosis present

## 2022-08-08 DIAGNOSIS — Z91041 Radiographic dye allergy status: Secondary | ICD-10-CM

## 2022-08-08 DIAGNOSIS — J9601 Acute respiratory failure with hypoxia: Secondary | ICD-10-CM | POA: Diagnosis present

## 2022-08-08 DIAGNOSIS — F32A Depression, unspecified: Secondary | ICD-10-CM | POA: Diagnosis present

## 2022-08-08 DIAGNOSIS — D500 Iron deficiency anemia secondary to blood loss (chronic): Secondary | ICD-10-CM | POA: Diagnosis not present

## 2022-08-08 DIAGNOSIS — Z981 Arthrodesis status: Secondary | ICD-10-CM

## 2022-08-08 DIAGNOSIS — K449 Diaphragmatic hernia without obstruction or gangrene: Secondary | ICD-10-CM | POA: Diagnosis present

## 2022-08-08 DIAGNOSIS — K921 Melena: Principal | ICD-10-CM | POA: Diagnosis present

## 2022-08-08 DIAGNOSIS — I129 Hypertensive chronic kidney disease with stage 1 through stage 4 chronic kidney disease, or unspecified chronic kidney disease: Secondary | ICD-10-CM | POA: Diagnosis present

## 2022-08-08 DIAGNOSIS — N138 Other obstructive and reflux uropathy: Secondary | ICD-10-CM | POA: Diagnosis present

## 2022-08-08 DIAGNOSIS — K635 Polyp of colon: Secondary | ICD-10-CM | POA: Diagnosis present

## 2022-08-08 DIAGNOSIS — Z8249 Family history of ischemic heart disease and other diseases of the circulatory system: Secondary | ICD-10-CM

## 2022-08-08 DIAGNOSIS — I10 Essential (primary) hypertension: Secondary | ICD-10-CM | POA: Diagnosis present

## 2022-08-08 DIAGNOSIS — Z79899 Other long term (current) drug therapy: Secondary | ICD-10-CM

## 2022-08-08 DIAGNOSIS — Z818 Family history of other mental and behavioral disorders: Secondary | ICD-10-CM

## 2022-08-08 DIAGNOSIS — E876 Hypokalemia: Secondary | ICD-10-CM | POA: Diagnosis present

## 2022-08-08 DIAGNOSIS — E1122 Type 2 diabetes mellitus with diabetic chronic kidney disease: Secondary | ICD-10-CM | POA: Diagnosis present

## 2022-08-08 DIAGNOSIS — G4733 Obstructive sleep apnea (adult) (pediatric): Secondary | ICD-10-CM | POA: Diagnosis present

## 2022-08-08 DIAGNOSIS — Z888 Allergy status to other drugs, medicaments and biological substances status: Secondary | ICD-10-CM

## 2022-08-08 DIAGNOSIS — R0603 Acute respiratory distress: Secondary | ICD-10-CM

## 2022-08-08 DIAGNOSIS — F0390 Unspecified dementia without behavioral disturbance: Secondary | ICD-10-CM | POA: Diagnosis present

## 2022-08-08 DIAGNOSIS — D62 Acute posthemorrhagic anemia: Secondary | ICD-10-CM | POA: Diagnosis present

## 2022-08-08 DIAGNOSIS — N401 Enlarged prostate with lower urinary tract symptoms: Secondary | ICD-10-CM | POA: Diagnosis present

## 2022-08-08 DIAGNOSIS — Z91040 Latex allergy status: Secondary | ICD-10-CM

## 2022-08-08 DIAGNOSIS — E7800 Pure hypercholesterolemia, unspecified: Secondary | ICD-10-CM | POA: Diagnosis present

## 2022-08-08 DIAGNOSIS — C61 Malignant neoplasm of prostate: Secondary | ICD-10-CM | POA: Diagnosis present

## 2022-08-08 DIAGNOSIS — Z96652 Presence of left artificial knee joint: Secondary | ICD-10-CM | POA: Diagnosis present

## 2022-08-08 LAB — CBC
HCT: 24.6 % — ABNORMAL LOW (ref 39.0–52.0)
Hemoglobin: 7.1 g/dL — ABNORMAL LOW (ref 13.0–17.0)
MCH: 27.7 pg (ref 26.0–34.0)
MCHC: 28.9 g/dL — ABNORMAL LOW (ref 30.0–36.0)
MCV: 96.1 fL (ref 80.0–100.0)
Platelets: 254 10*3/uL (ref 150–400)
RBC: 2.56 MIL/uL — ABNORMAL LOW (ref 4.22–5.81)
RDW: 15.1 % (ref 11.5–15.5)
WBC: 9.7 10*3/uL (ref 4.0–10.5)
nRBC: 0 % (ref 0.0–0.2)

## 2022-08-08 LAB — COMPREHENSIVE METABOLIC PANEL
ALT: 8 U/L (ref 0–44)
AST: 10 U/L — ABNORMAL LOW (ref 15–41)
Albumin: 3 g/dL — ABNORMAL LOW (ref 3.5–5.0)
Alkaline Phosphatase: 53 U/L (ref 38–126)
Anion gap: 5 (ref 5–15)
BUN: 13 mg/dL (ref 8–23)
CO2: 29 mmol/L (ref 22–32)
Calcium: 9.4 mg/dL (ref 8.9–10.3)
Chloride: 106 mmol/L (ref 98–111)
Creatinine, Ser: 0.75 mg/dL (ref 0.61–1.24)
GFR, Estimated: >60 mL/min (ref >60)
Glucose, Bld: 113 mg/dL — ABNORMAL HIGH (ref 70–99)
Potassium: 3.8 mmol/L (ref 3.5–5.1)
Sodium: 140 mmol/L (ref 135–145)
Total Bilirubin: 0.6 mg/dL (ref 0.3–1.2)
Total Protein: 6.5 g/dL (ref 6.5–8.1)

## 2022-08-08 LAB — LACTIC ACID, PLASMA
Lactic Acid, Venous: 1.1 mmol/L (ref 0.5–1.9)
Lactic Acid, Venous: 1.2 mmol/L (ref 0.5–1.9)

## 2022-08-08 LAB — GLUCOSE, CAPILLARY: Glucose-Capillary: 100 mg/dL — ABNORMAL HIGH (ref 70–99)

## 2022-08-08 LAB — BRAIN NATRIURETIC PEPTIDE: B Natriuretic Peptide: 96 pg/mL (ref 0.0–100.0)

## 2022-08-08 LAB — PREPARE RBC (CROSSMATCH)

## 2022-08-08 LAB — POC OCCULT BLOOD, ED: Fecal Occult Bld: POSITIVE — AB

## 2022-08-08 MED ORDER — PREGABALIN 75 MG PO CAPS
75.0000 mg | ORAL_CAPSULE | Freq: Two times a day (BID) | ORAL | Status: DC
  Administered 2022-08-09 – 2022-08-15 (×12): 75 mg via ORAL
  Filled 2022-08-08 (×13): qty 1

## 2022-08-08 MED ORDER — PANTOPRAZOLE SODIUM 40 MG IV SOLR
40.0000 mg | Freq: Once | INTRAVENOUS | Status: DC
Start: 2022-08-08 — End: 2022-08-08

## 2022-08-08 MED ORDER — SODIUM CHLORIDE 0.9 % IV SOLN
10.0000 mL/h | Freq: Once | INTRAVENOUS | Status: AC
  Administered 2022-08-08: 10 mL/h via INTRAVENOUS

## 2022-08-08 MED ORDER — TOPIRAMATE 25 MG PO TABS
25.0000 mg | ORAL_TABLET | Freq: Two times a day (BID) | ORAL | Status: DC
  Administered 2022-08-09 – 2022-08-15 (×12): 25 mg via ORAL
  Filled 2022-08-08 (×13): qty 1

## 2022-08-08 MED ORDER — FUROSEMIDE 20 MG PO TABS: 20.0000 mg | ORAL_TABLET | ORAL | Status: DC

## 2022-08-08 MED ORDER — ONDANSETRON HCL 4 MG PO TABS: 4.0000 mg | ORAL_TABLET | Freq: Four times a day (QID) | ORAL | Status: DC | PRN

## 2022-08-08 MED ORDER — ZOLPIDEM TARTRATE 5 MG PO TABS
5.0000 mg | ORAL_TABLET | Freq: Every evening | ORAL | Status: DC | PRN
  Administered 2022-08-10 – 2022-08-11 (×2): 5 mg via ORAL
  Filled 2022-08-08 (×2): qty 1

## 2022-08-08 MED ORDER — FINASTERIDE 5 MG PO TABS
5.0000 mg | ORAL_TABLET | Freq: Every day | ORAL | Status: DC
  Administered 2022-08-09 – 2022-08-15 (×6): 5 mg via ORAL
  Filled 2022-08-08 (×7): qty 1

## 2022-08-08 MED ORDER — SODIUM CHLORIDE 0.9 % IV BOLUS
1000.0000 mL | Freq: Once | INTRAVENOUS | Status: AC
  Administered 2022-08-08: 1000 mL via INTRAVENOUS

## 2022-08-08 MED ORDER — ONDANSETRON HCL 4 MG/2ML IJ SOLN: 4.0000 mg | Freq: Four times a day (QID) | INTRAMUSCULAR | Status: DC | PRN

## 2022-08-08 MED ORDER — ESCITALOPRAM OXALATE 10 MG PO TABS
20.0000 mg | ORAL_TABLET | Freq: Every day | ORAL | Status: DC
  Administered 2022-08-09 – 2022-08-15 (×6): 20 mg via ORAL
  Filled 2022-08-08 (×7): qty 2

## 2022-08-08 MED ORDER — SODIUM CHLORIDE 0.9 % IV SOLN: INTRAVENOUS | Status: DC

## 2022-08-08 MED ORDER — PANTOPRAZOLE SODIUM 40 MG IV SOLR
40.0000 mg | Freq: Two times a day (BID) | INTRAVENOUS | Status: DC
Start: 2022-08-08 — End: 2022-08-15
  Administered 2022-08-08 – 2022-08-15 (×14): 40 mg via INTRAVENOUS
  Filled 2022-08-08 (×14): qty 10

## 2022-08-08 MED ORDER — ATORVASTATIN CALCIUM 40 MG PO TABS
40.0000 mg | ORAL_TABLET | Freq: Every day | ORAL | Status: DC
  Administered 2022-08-09 – 2022-08-15 (×6): 40 mg via ORAL
  Filled 2022-08-08 (×7): qty 1

## 2022-08-08 NOTE — H&P (Signed)
History and Physical    Patient: Jared Bridges ZWC:[id number] DOB: 03/07/1930 DOA: 08/08/2022 DOS: the patient was seen and examined on 08/08/2022 PCP: Jared Castle, MD  Patient coming from: Home  Chief Complaint:  Chief Complaint  Patient presents with   Fatigue    Lethargic   HPI: Jared Bridges is a 88 y.o. male with medical history significant of polymyalgia rheumatica, stroke, diabetes, dementia, hypertension, weakness, fatigue.  The patient's daughter is his primary caregiver.  They have been trying to get him in to skilled nursing facility.  He does have some home health care.  Patient has been having low blood pressure today and has been slightly more confused with difficulty with increasing weakness.  He denies any significant pain, shortness of breath, fevers, chills.  It appears that he has been having black tar-like stools for the past several weeks.  His hemoglobin has been dropping over the past couple of months without any investigation.  Review of Systems: As mentioned in the history of present illness. All other systems reviewed and are negative. Past Medical History:  Diagnosis Date   Anemia, iron deficiency    Chronic headaches    Migraines   Chronic kidney disease    Resolved   Depression    Diverticulitis    Essential hypertension    GI bleed    Dr. Laural Golden - 1998   Hypercholesteremia    Orthostatic hypotension    Peptic ulcer disease    Peripheral neuropathy    Polymyalgia rheumatica ([name]    Stroke ([place])    Residual trouble reading and writing   Syncope    Thyroid nodule    TIA (transient ischemic attack) 12/2017   Type 2 diabetes mellitus ([place])    Past Surgical History:  Procedure Laterality Date   APPENDECTOMY     as child   BACK SURGERY     2000 IDET SPINAL PROC   CHOLECYSTECTOMY N/A 04/07/2016   Procedure: CHOLECYSTECTOMY;  Surgeon: Aviva Signs, MD;  Location: [facility];  Service: General;  Laterality: N/A;   [place]   COLONOSCOPY N/A 01/07/2018   Procedure: COLONOSCOPY;  Surgeon: Rogene Houston, MD;  Location: [facility];  Service: Endoscopy;  Laterality: N/A;  1:00   CRYOTHERAPY     EYE SURGERY     right cataract with lens implant   FEMUR SURGERY     RT LEG        1953   HAND SURGERY     RIGHT   HERNIA REPAIR     1-right inguinal, 3- left inguinal   IRRIGATION AND DEBRIDEMENT KNEE Left 07/09/2015   Procedure: LEFT KNEE IRRIGATION AND DEBRIDEMENT WOUND CLOSURE;  Surgeon: Gaynelle Arabian, MD;  Location: [facility];  Service: Orthopedics;  Laterality: Left;   JOINT REPLACEMENT  02/28/2016   lt knee revision   KNEE DEBRIDEMENT     2016   LEFT   MENISCUS REPAIR     LEFT   POLYPECTOMY  01/07/2018   Procedure: POLYPECTOMY;  Surgeon: Rogene Houston, MD;  Location: [facility];  Service: Endoscopy;;  colon    SHOULDER OPEN ROTATOR CUFF REPAIR     RT SHOULDER   SPINAL FUSION     SPINAL FUSION     TONSILLECTOMY     TOTAL KNEE ARTHROPLASTY Left 05/28/2015   Procedure: LEFT TOTAL KNEE ARTHROPLASTY;  Surgeon: Gaynelle Arabian, MD;  Location: [facility];  Service: Orthopedics;  Laterality: Left;   TOTAL KNEE REVISION Left 02/28/2016   Procedure: LEFT TOTAL KNEE REVISION;  Surgeon: Leandrew Koyanagi, MD;  Location: [facility];  Service: Orthopedics;  Laterality: Left;   TRIGGER FINGER RELEASE     RT HAND   1990S   UVULECTOMY  2004   TURBINATE,TONSIL,ADENOID   Social History:  reports that he quit smoking about 35 years ago. His smoking use included cigarettes. He started smoking about 63 years ago. He has a 1.80 pack-year smoking history. He has never used smokeless tobacco. He reports that he does not drink alcohol and does not use drugs.  Allergies  Allergen Reactions   Iodine Hives, Itching and Other (See Comments)    very allergic(per daughter), can pre-med with benadryl   Calan [Verapamil] Other (See Comments)    Weakness   Cymbalta [Duloxetine Hcl] Other (See Comments)    Makes patient have jerking motions.    Iodinated Contrast Media Hives, Itching and Other (See Comments)    Can pre-med with benadryl   Latex Other (See Comments)    Redness iritation   Lisinopril Other (See Comments)    Makes patient have jerking motions.   Neurontin [Gabapentin] Other (See Comments)    Makes patient have jerking motions.   Zoloft [Sertraline Hcl] Diarrhea and Other (See Comments)    Makes patient have jerking motions.   Valium [Diazepam] Other (See Comments)    "Did not work"   Flomax [Tamsulosin Hcl] Other (See Comments)    Dizziness   Glimepiride Other (See Comments)    "Did not work"   Melatonin Nausea Only   Tape Other (See Comments)    Skin irritation    Family History  Problem Relation Age of Onset   Colon cancer Sister    Hypertension Other    Depression Brother    Depression Daughter     Prior to Admission medications   Medication Sig Start Date End Date Taking? Authorizing Provider  furosemide (LASIX) 20 MG tablet Take 1 tablet (20 mg total) by mouth every other day. 07/09/22 08/08/22 Yes Gerlene Fee, NP  zolpidem (AMBIEN) 5 MG tablet Take 5-10 mg by mouth at bedtime as needed for sleep. 07/09/22  Yes [provider]  amoxicillin-clavulanate (AUGMENTIN) 875-125 MG tablet Take 1 tablet by mouth every 12 (twelve) hours. 07/16/22   Myna Bright M, PA-C  atorvastatin (LIPITOR) 40 MG tablet Take 1 tablet (40 mg total) by mouth daily. 07/09/22   Gerlene Fee, NP  Cholecalciferol (VITAMIN D3) 250 MCG (10000 UT) TABS Take 1,000 Units by mouth daily.    [provider]  escitalopram (LEXAPRO) 20 MG tablet Take 1 tablet (20 mg total) by mouth daily. 07/09/22   Gerlene Fee, NP  ferrous sulfate 325 (65 FE) MG tablet Take 1 tablet (325 mg total) by mouth 3 (three) times a week. Mon, wed, fri 07/09/22   Gerlene Fee, NP  finasteride (PROSCAR) 5 MG tablet Take 1 tablet (5 mg total) by mouth daily. 07/09/22   Gerlene Fee, NP  omeprazole (PRILOSEC) 40 MG capsule Take 1  capsule (40 mg total) by mouth daily. 07/09/22   Gerlene Fee, NP  pregabalin (LYRICA) 75 MG capsule Take 1 capsule (75 mg total) by mouth 2 (two) times daily. 07/09/22   Gerlene Fee, NP  topiramate (TOPAMAX) 50 MG tablet Take 0.5 tablets (25 mg total) by mouth 2 (two) times daily. 07/09/22   Gerlene Fee, NP  vitamin B-12 (  CYANOCOBALAMIN) 500 MCG tablet Take 1,000 mcg by mouth daily.    [provider]    Physical Exam: Vitals:   08/08/22 1939 08/08/22 1945 08/08/22 1956 08/08/22 2011  BP: (!) 145/61   (!) 122/51  Pulse: 73 66  63  Resp: (!) 22 (!) 22  19  Temp:   97.7 F (36.5 C) 97.7 F (36.5 C)  TempSrc:   Oral Oral  SpO2: 95% 100%  100%   General: Elderly male. Awake and alert and oriented x3. No acute cardiopulmonary distress.  HEENT: Normocephalic atraumatic.  Right and left ears normal in appearance.  Pupils equal, round, reactive to light. Extraocular muscles are intact. Sclerae anicteric and noninjected.  Moist mucosal membranes. No mucosal lesions.  Neck: Neck supple without lymphadenopathy. No carotid bruits. No masses palpated.  Cardiovascular: Regular rate with normal S1-S2 sounds. No murmurs, rubs, gallops auscultated. No JVD.  Respiratory: Good respiratory effort with no wheezes, rales, rhonchi. Lungs clear to auscultation bilaterally.  No accessory muscle use. Abdomen: Soft, nontender, nondistended. Active bowel sounds. No masses or hepatosplenomegaly  Skin: No rashes, lesions, or ulcerations.  Dry, warm to touch. 2+ dorsalis pedis and radial pulses. Musculoskeletal: No calf or leg pain. All major joints not erythematous nontender.  No upper or lower joint deformation.  Good ROM.  No contractures  Psychiatric: Intact judgment and insight. Pleasant and cooperative. Neurologic: No focal neurological deficits. Strength is 5/5 and symmetric in upper and lower extremities.  Cranial nerves II through XII are grossly intact.  Data Reviewed: Results for orders  placed or performed during the hospital encounter of 08/08/22 (from the past 24 hour(s))  Blood culture (routine x 2)     Status: None (Preliminary result)   Collection Time: 08/08/22  2:09 PM   Specimen: BLOOD RIGHT HAND  Result Value Ref Range   Specimen Description BLOOD RIGHT HAND BOTTLES DRAWN AEROBIC ONLY    Special Requests      Blood Culture results may not be optimal due to an inadequate volume of blood received in culture bottles Performed at [facility], [address] [place], [place] [address]    Culture PENDING    Report Status PENDING   CBC     Status: Abnormal   Collection Time: 08/08/22  3:32 PM  Result Value Ref Range   WBC 9.7 4.0 - 10.5 K/uL   RBC 2.56 (L) 4.22 - 5.81 MIL/uL   Hemoglobin 7.1 (L) 13.0 - 17.0 g/dL   HCT 24.6 (L) 39.0 - 52.0 %   MCV 96.1 80.0 - 100.0 fL   MCH 27.7 26.0 - 34.0 pg   MCHC 28.9 (L) 30.0 - 36.0 g/dL   RDW 15.1 11.5 - 15.5 %   Platelets 254 150 - 400 K/uL   nRBC 0.0 0.0 - 0.2 %  Comprehensive metabolic panel     Status: Abnormal   Collection Time: 08/08/22  3:32 PM  Result Value Ref Range   Sodium 140 135 - 145 mmol/L   Potassium 3.8 3.5 - 5.1 mmol/L   Chloride 106 98 - 111 mmol/L   CO2 29 22 - 32 mmol/L   Glucose, Bld 113 (H) 70 - 99 mg/dL   BUN 13 8 - 23 mg/dL   Creatinine, Ser 0.75 0.61 - 1.24 mg/dL   Calcium 9.4 8.9 - 10.3 mg/dL   Total Protein 6.5 6.5 - 8.1 g/dL   Albumin 3.0 (L) 3.5 - 5.0 g/dL   AST 10 (L) 15 - 41 U/L  ALT 8 0 - 44 U/L   Alkaline Phosphatase 53 38 - 126 U/L   Total Bilirubin 0.6 0.3 - 1.2 mg/dL   GFR, Estimated >60 >60 mL/min   Anion gap 5 5 - 15  Brain natriuretic peptide     Status: None   Collection Time: 08/08/22  3:32 PM  Result Value Ref Range   B Natriuretic Peptide 96.0 0.0 - 100.0 pg/mL  Lactic acid, plasma     Status: None   Collection Time: 08/08/22  3:32 PM  Result Value Ref Range   Lactic Acid, Venous 1.1 0.5 - 1.9 mmol/L  Lactic acid, plasma     Status: None   Collection Time:  08/08/22  4:09 PM  Result Value Ref Range   Lactic Acid, Venous 1.2 0.5 - 1.9 mmol/L  POC occult blood, ED     Status: Abnormal   Collection Time: 08/08/22  4:52 PM  Result Value Ref Range   Fecal Occult Bld POSITIVE (A) NEGATIVE  Blood culture (routine x 2)     Status: None (Preliminary result)   Collection Time: 08/08/22  5:58 PM   Specimen: BLOOD RIGHT HAND  Result Value Ref Range   Specimen Description BLOOD RIGHT HAND BOTTLES DRAWN AEROBIC ONLY    Special Requests      Blood Culture results may not be optimal due to an inadequate volume of blood received in culture bottles Performed at [facility], [address] [place], [place] [address]    Culture PENDING    Report Status PENDING   Type and screen [facility]     Status: None (Preliminary result)   Collection Time: 08/08/22  5:58 PM  Result Value Ref Range   ABO/RH(D) O POS    Antibody Screen NEG    Sample Expiration 08/11/2022,2359    Unit Number [id number]    Blood Component Type RED CELLS,LR    Unit division 00    Status of Unit ISSUED    Transfusion Status OK TO TRANSFUSE    Crossmatch Result      Compatible Performed at [facility], [address] [place], [place] [address]   Prepare RBC (crossmatch)     Status: None   Collection Time: 08/08/22  5:59 PM  Result Value Ref Range   Order Confirmation      ORDER PROCESSED BY BLOOD BANK Performed at [facility], [address] [place], [place] [address]    DG Chest 2 View  Result Date: 08/08/2022 CLINICAL DATA:  Shortness of breath with fatigue and generalized weakness. EXAM: CHEST - 2 VIEW COMPARISON:  07/13/2022 FINDINGS: Lungs are adequately inflated without focal airspace consolidation. Possible layering small effusion on the lateral film likely left-sided. Cardiomediastinal silhouette and remainder of the exam is unchanged. IMPRESSION: Possible posterior layering small left pleural effusion. Electronically Signed   By: Marin Olp M.D.   On:  08/08/2022 16:26     Assessment and Plan: No notes have been filed under this hospital service. Service: Hospitalist  Principal Problem:   Iron deficiency anemia secondary to blood loss (chronic) Active Problems:   Essential hypertension   Iron deficiency anemia due to chronic blood loss   Upper GI bleed   Hypercholesterolemia   Benign prostatic hyperplasia with urinary obstruction   Essential (primary) hypertension   Prostate cancer ([name]   Diabetic peripheral neuropathy ([place])   Obstructive sleep apnea of adult   Controlled type 2 diabetes mellitus with hyperglycemia, without long-term current use of insulin ([place])  History of CVA (cerebrovascular accident)   Grade I diastolic dysfunction  Iron deficiency anemia secondary to chronic blood loss No abdominal tenderness We will transfuse 2 units.  With history of cardiac disease, should get his hemoglobin up around 9 Recheck CBC in the morning Upper GI bleed GI consulted Keep n.p.o. Probable scope tomorrow Hypertension Continue antihypertensives Diabetes Sliding scale insulin History of CVA Grade 1 diastolic dysfunction    Advance Care Planning:   Code Status: Prior full code confirmed by patient  Consults: GI  Family Communication: None available  Severity of Illness: The appropriate patient status for this patient is INPATIENT. Inpatient status is judged to be reasonable and necessary in order to provide the required intensity of service to ensure the patient's safety. The patient's presenting symptoms, physical exam findings, and initial radiographic and laboratory data in the context of their chronic comorbidities is felt to place them at high risk for further clinical deterioration. Furthermore, it is not anticipated that the patient will be medically stable for discharge from the hospital within 2 midnights of admission.   * I certify that at the point of admission it is my clinical judgment that the patient will  require inpatient hospital care spanning beyond 2 midnights from the point of admission due to high intensity of service, high risk for further deterioration and high frequency of surveillance required.*  Author: Truett Mainland, DO 08/08/2022 8:16 PM  For on call review [email or web address].

## 2022-08-08 NOTE — ED Provider Notes (Signed)
[facility] EMERGENCY DEPARTMENT Provider Note   CSN: [id number] Arrival date & time: 08/08/22  1300     History  Chief Complaint  Patient presents with   Fatigue    Lethargic    CASIN FEDERICI is a 88 y.o. male with past medical history significant for PMR, stroke, diabetes, recent previous sepsis from urinary infection, dementia, significant advanced age, hypertension, encephalopathy who presents to the emergency department with weakness, fatigue, low blood pressure, some gurgling respirations, and ongoing need for care.  This patient has been seen evaluated our emergency department several times recently, daughter is a retired Haematologist from [place], they have been struggling to seek long-term care for this patient.  Currently they have home health coming for a few hours every week, and daughter is providing large amount of patient's care.  He had low blood pressure today which prompted new visit to the emergency department.  Patient is demented at baseline, oriented to self but disoriented to time, situation, he is oriented to place as well.  He denies any significant pain, shortness of breath, fever, chills at this time.  HPI     Home Medications Prior to Admission medications   Medication Sig Start Date End Date Taking? Authorizing Provider  amoxicillin-clavulanate (AUGMENTIN) 875-125 MG tablet Take 1 tablet by mouth every 12 (twelve) hours. 07/16/22   Myna Bright M, PA-C  atorvastatin (LIPITOR) 40 MG tablet Take 1 tablet (40 mg total) by mouth daily. 07/09/22   Gerlene Fee, NP  Cholecalciferol (VITAMIN D3) 250 MCG (10000 UT) TABS Take 1,000 Units by mouth daily.    [provider]  escitalopram (LEXAPRO) 20 MG tablet Take 1 tablet (20 mg total) by mouth daily. 07/09/22   Gerlene Fee, NP  ferrous sulfate 325 (65 FE) MG tablet Take 1 tablet (325 mg total) by mouth 3 (three) times a week. Mon, wed, fri 07/09/22   Gerlene Fee, NP  finasteride  (PROSCAR) 5 MG tablet Take 1 tablet (5 mg total) by mouth daily. 07/09/22   Gerlene Fee, NP  furosemide (LASIX) 20 MG tablet Take 1 tablet (20 mg total) by mouth every other day. 07/09/22 08/08/22  Gerlene Fee, NP  omeprazole (PRILOSEC) 40 MG capsule Take 1 capsule (40 mg total) by mouth daily. 07/09/22   Gerlene Fee, NP  pregabalin (LYRICA) 75 MG capsule Take 1 capsule (75 mg total) by mouth 2 (two) times daily. 07/09/22   Gerlene Fee, NP  topiramate (TOPAMAX) 50 MG tablet Take 0.5 tablets (25 mg total) by mouth 2 (two) times daily. 07/09/22   Gerlene Fee, NP  vitamin B-12 (CYANOCOBALAMIN) 500 MCG tablet Take 1,000 mcg by mouth daily.    [provider]  zolpidem (AMBIEN) 5 MG tablet Take 5-10 mg by mouth at bedtime as needed. 07/09/22   [provider]      Allergies    Iodine, Calan [verapamil], Cymbalta [duloxetine hcl], Iodinated contrast media, Latex, Lisinopril, Neurontin [gabapentin], Zoloft [sertraline hcl], Valium [diazepam], Flomax [tamsulosin hcl], Glimepiride, Melatonin, and Tape    Review of Systems   Review of Systems  Reason unable to perform ROS: dementia.    Physical Exam Updated Vital Signs BP (!) 101/48 (BP Location: Left Arm)   Pulse 64   Temp 97.9 F (36.6 C) (Oral)   Resp 14   SpO2 99%  Physical Exam Vitals and nursing note reviewed.  Constitutional:      General: He is not in  acute distress.    Appearance: He is ill-appearing.     Comments: Patient is chronically ill-appearing, but in no acute distress, he is with signs of advanced age, and general skin wasting  HENT:     Head: Normocephalic and atraumatic.     Mouth/Throat:     Mouth: Mucous membranes are dry.  Eyes:     General:        Right eye: No discharge.        Left eye: No discharge.  Cardiovascular:     Rate and Rhythm: Normal rate and regular rhythm.     Heart sounds: No murmur heard.    No friction rub. No gallop.  Pulmonary:     Effort: Pulmonary effort is  normal.     Breath sounds: Normal breath sounds.     Comments: Some shallow respirations, occasional rhonchi, crackles that seem to clear with cough, no noted focal consolidation Abdominal:     General: Bowel sounds are normal.     Palpations: Abdomen is soft.     Comments: No significant tenderness palpation  Skin:    General: Skin is warm and dry.     Capillary Refill: Capillary refill takes less than 2 seconds.  Neurological:     Mental Status: He is alert.     Comments: Alert and oriented to self and place but not time and situation  Psychiatric:        Mood and Affect: Mood normal.        Behavior: Behavior normal.     ED Results / Procedures / Treatments   Labs (all labs ordered are listed, but only abnormal results are displayed) Labs Reviewed  CBC - Abnormal; Notable for the following components:      Result Value   RBC 2.56 (*)    Hemoglobin 7.1 (*)    HCT 24.6 (*)    MCHC 28.9 (*)    All other components within normal limits  COMPREHENSIVE METABOLIC PANEL - Abnormal; Notable for the following components:   Glucose, Bld 113 (*)    Albumin 3.0 (*)    AST 10 (*)    All other components within normal limits  POC OCCULT BLOOD, ED - Abnormal; Notable for the following components:   Fecal Occult Bld POSITIVE (*)    All other components within normal limits  CULTURE, BLOOD (ROUTINE X 2)  CULTURE, BLOOD (ROUTINE X 2)  URINE CULTURE  BRAIN NATRIURETIC PEPTIDE  LACTIC ACID, PLASMA  LACTIC ACID, PLASMA  URINALYSIS, ROUTINE W REFLEX MICROSCOPIC  TYPE AND SCREEN  PREPARE RBC (CROSSMATCH)    EKG None  Radiology DG Chest 2 View  Result Date: 08/08/2022 CLINICAL DATA:  Shortness of breath with fatigue and generalized weakness. EXAM: CHEST - 2 VIEW COMPARISON:  07/13/2022 FINDINGS: Lungs are adequately inflated without focal airspace consolidation. Possible layering small effusion on the lateral film likely left-sided. Cardiomediastinal silhouette and remainder of the  exam is unchanged. IMPRESSION: Possible posterior layering small left pleural effusion. Electronically Signed   By: Marin Olp M.D.   On: 08/08/2022 16:26    Procedures .Critical Care  Performed by: Anselmo Pickler, PA-C Authorized by: Anselmo Pickler, PA-C   Critical care provider statement:    Critical care time (minutes):  35   Critical care was necessary to treat or prevent imminent or life-threatening deterioration of the following conditions:  Circulatory failure (symptomatic anemia)   Critical care was time spent personally by me on the following activities:  Development of treatment plan with patient or surrogate, discussions with consultants, evaluation of patient's response to treatment, examination of patient, ordering and review of laboratory studies, ordering and review of radiographic studies, ordering and performing treatments and interventions, pulse oximetry, re-evaluation of patient's condition and review of old [place] discussed with: admitting provider       Medications Ordered in ED Medications  0.9 %  sodium chloride infusion (has no administration in time range)  pantoprazole (PROTONIX) injection 40 mg (has no administration in time range)  sodium chloride 0.9 % bolus 1,000 mL (1,000 mLs Intravenous New Bag/Given 08/08/22 1535)    ED Course/ Medical Decision Making/ A&P Clinical Course as of 08/08/22 1851  Fri Aug 08, 2022  1733 Hemoglobin(!): 7.1 Decreased 1.6 from previous -- hemoccult positive [CP]    Clinical Course User Index [CP] Anselmo Pickler, PA-C                           Medical Decision Making Amount and/or Complexity of Data Reviewed Labs: ordered. Decision-making details documented in ED Course. Radiology: ordered.  Risk Prescription drug management.  This patient is a 88 y.o. male who presents to the ED for concern of generalized fatigue, weakness, hypotension, this involves an extensive number of treatment  options, and is a complaint that carries with it a high risk of complications and morbidity. The emergent differential diagnosis prior to evaluation includes, but is not limited to, dehydration, symptomatic anemia, electrolyte abnormality, general worsening of aging, sepsis, cardiogenic syncope, neurogenic syncope, stroke, hypoglycemia versus other  This is not an exhaustive differential.   Past Medical History / Co-morbidities / Social History: PMR, stroke, diabetes, recent previous sepsis from urinary infection, dementia, significant advanced age, hypertension, encephalopathy  Additional history: Chart reviewed. Pertinent results include: Reviewed lab work, imaging from recent previous emergency department visits.  Notably patient with worsening hemoglobin over the last month with significant decline from max of 11.4 one month ago  to 7.1 today.  Physical Exam: Physical exam performed. The pertinent findings include: patient without any significant neurologic change from his baseline per family, he is alert and oriented to self and place but not situation or time, normal heart rate and rhythm, afebrile, nonseptic, nontoxic appearance, no tenderness to palpation of the abdomen, chest, no respiratory distress or accessory breath sounds noted.  Lab Tests: I ordered, and personally interpreted labs.  The pertinent results include: CMP overall unremarkable other than mild hyperglycemia glucose 113, and decreased albumin at 3.0 which may represent general protein calorie malnutrition.  BNP is normal, lactic acid is normal.  His Hemoccult is positive for blood.  His CBC is notable for anemia with hemoglobin of 7.1 from recent baseline of 8.7, however with general downtrend since 1 month ago   Imaging Studies: I ordered imaging studies including plain film chest x-ray. I independently visualized and interpreted imaging which showed posterior layering small left-sided pleural effusion, no evidence of other  acute intrathoracic abnormality. I agree with the radiologist interpretation.   Cardiac Monitoring:  The patient was maintained on a cardiac monitor.  My attending physician Dr. Roderic Palau viewed and interpreted the cardiac monitored which showed an underlying rhythm of: Normal sinus rhythm with retrograde block. I agree with this interpretation.   Medications: I ordered medication including fluid bolus for hypotension on arrival, Protonix twice daily per GI recommendation for upper GI bleed, 2 units red blood cells for symptomatic anemia.  Patient  feeling somewhat improved at this time, but will require reevaluation of his hypertension, generalized fatigue and weakness after receiving blood products.  Consultations Obtained: I requested consultation with the gastroenterologist, spoke with Dr. Jenetta Downer who recommended Protonix twice daily, n.p.o. status so he can have scope in the morning, and admission to medicine, spoke with Dr. Sheran Lawless,  and discussed lab and imaging findings as well as pertinent plan - they recommend: Admission   Disposition: After consideration of the diagnostic results and the patients response to treatment, I feel that patient would benefit from hospital admission at this time .   I discussed this case with my attending physician Dr. Roderic Palau who cosigned this note including patient's presenting symptoms, physical exam, and planned diagnostics and interventions. Attending physician stated agreement with plan or made changes to plan which were implemented.    Final Clinical Impression(s) / ED Diagnoses Final diagnoses:  Upper GI bleed  Symptomatic anemia    Rx / DC Orders ED Discharge Orders     None         Dorien Chihuahua 08/08/22 1851    Milton Ferguson, MD 08/17/22 1034

## 2022-08-08 NOTE — ED Notes (Signed)
Cleaned pt up, placed new brief and chuck on pt.

## 2022-08-08 NOTE — Progress Notes (Signed)
2130 pt brought to room 312 with blood transfusing from ED, spoke to [name] pt's daughter she is aware of pt being admitted in room 312. VSS.

## 2022-08-08 NOTE — ED Triage Notes (Signed)
Patient  present to ED via [facility] from home.EMS reports he is a full code  Alert and oriented to person, disoriented to time and situation.Patient c/o generalized weakness and fatigue. Patient has been lethargic and decrease appetite for several days. EMS reports CBG 121 Patient is  bed bound, Denies any pain. No acute distress noted.

## 2022-08-09 ENCOUNTER — Encounter (HOSPITAL_COMMUNITY)
Admission: EM | Disposition: A | Discharge status: Home Health | Payer: Self-pay | Source: Home / Self Care | Attending: Internal Medicine

## 2022-08-09 ENCOUNTER — Inpatient Hospital Stay (HOSPITAL_COMMUNITY): Payer: Medicare Other | Admitting: Anesthesiology

## 2022-08-09 DIAGNOSIS — D509 Iron deficiency anemia, unspecified: Secondary | ICD-10-CM

## 2022-08-09 DIAGNOSIS — K449 Diaphragmatic hernia without obstruction or gangrene: Secondary | ICD-10-CM

## 2022-08-09 DIAGNOSIS — D62 Acute posthemorrhagic anemia: Secondary | ICD-10-CM | POA: Diagnosis not present

## 2022-08-09 DIAGNOSIS — D500 Iron deficiency anemia secondary to blood loss (chronic): Secondary | ICD-10-CM | POA: Diagnosis not present

## 2022-08-09 DIAGNOSIS — K921 Melena: Secondary | ICD-10-CM | POA: Diagnosis not present

## 2022-08-09 DIAGNOSIS — J9601 Acute respiratory failure with hypoxia: Secondary | ICD-10-CM | POA: Diagnosis not present

## 2022-08-09 DIAGNOSIS — L89312 Pressure ulcer of right buttock, stage 2: Secondary | ICD-10-CM | POA: Diagnosis not present

## 2022-08-09 DIAGNOSIS — Z980 Intestinal bypass and anastomosis status: Secondary | ICD-10-CM | POA: Diagnosis not present

## 2022-08-09 HISTORY — PX: ESOPHAGOGASTRODUODENOSCOPY (EGD) WITH PROPOFOL: SHX5813

## 2022-08-09 LAB — BASIC METABOLIC PANEL
Anion gap: 5 (ref 5–15)
BUN: 12 mg/dL (ref 8–23)
CO2: 27 mmol/L (ref 22–32)
Calcium: 9.1 mg/dL (ref 8.9–10.3)
Chloride: 110 mmol/L (ref 98–111)
Creatinine, Ser: 0.74 mg/dL (ref 0.61–1.24)
GFR, Estimated: >60 mL/min (ref >60)
Glucose, Bld: 96 mg/dL (ref 70–99)
Potassium: 3.9 mmol/L (ref 3.5–5.1)
Sodium: 142 mmol/L (ref 135–145)

## 2022-08-09 LAB — URINALYSIS, ROUTINE W REFLEX MICROSCOPIC
Bilirubin Urine: NEGATIVE
Glucose, UA: NEGATIVE mg/dL
Hgb urine dipstick: NEGATIVE
Ketones, ur: NEGATIVE mg/dL
Leukocytes,Ua: NEGATIVE
Nitrite: NEGATIVE
Protein, ur: NEGATIVE mg/dL
Specific Gravity, Urine: 1.015 (ref 1.005–1.030)
pH: 5 (ref 5.0–8.0)

## 2022-08-09 LAB — TYPE AND SCREEN
ABO/RH(D): O POS
Antibody Screen: NEGATIVE
Unit division: 0

## 2022-08-09 LAB — CBC
HCT: 27.2 % — ABNORMAL LOW (ref 39.0–52.0)
Hemoglobin: 8 g/dL — ABNORMAL LOW (ref 13.0–17.0)
MCH: 28.1 pg (ref 26.0–34.0)
MCHC: 29.4 g/dL — ABNORMAL LOW (ref 30.0–36.0)
MCV: 95.4 fL (ref 80.0–100.0)
Platelets: 241 10*3/uL (ref 150–400)
RBC: 2.85 MIL/uL — ABNORMAL LOW (ref 4.22–5.81)
RDW: 15.2 % (ref 11.5–15.5)
WBC: 8.2 10*3/uL (ref 4.0–10.5)
nRBC: 0 % (ref 0.0–0.2)

## 2022-08-09 LAB — BPAM RBC
Blood Product Expiration Date: 202310102359
ISSUE DATE / TIME: 202309082003
Unit Type and Rh: 5100

## 2022-08-09 LAB — GLUCOSE, CAPILLARY: Glucose-Capillary: 93 mg/dL (ref 70–99)

## 2022-08-09 SURGERY — ESOPHAGOGASTRODUODENOSCOPY (EGD) WITH PROPOFOL
Anesthesia: General

## 2022-08-09 MED ORDER — LIDOCAINE HCL (PF) 2 % IJ SOLN
INTRAMUSCULAR | Status: DC | PRN
  Administered 2022-08-09: 5 mg via INTRADERMAL

## 2022-08-09 MED ORDER — PEG 3350-KCL-NA BICARB-NACL 420 G PO SOLR
4000.0000 mL | Freq: Once | ORAL | Status: AC
  Administered 2022-08-09: 4000 mL via ORAL

## 2022-08-09 MED ORDER — LIDOCAINE HCL (CARDIAC) PF 100 MG/5ML IV SOSY
PREFILLED_SYRINGE | INTRAVENOUS | Status: DC | PRN
  Administered 2022-08-09: 50 mg via INTRATRACHEAL

## 2022-08-09 MED ORDER — PROPOFOL 10 MG/ML IV BOLUS
INTRAVENOUS | Status: AC
  Filled 2022-08-09: qty 20

## 2022-08-09 MED ORDER — LIDOCAINE HCL (PF) 2 % IJ SOLN
INTRAMUSCULAR | Status: AC
  Filled 2022-08-09: qty 5

## 2022-08-09 MED ORDER — LIDOCAINE VISCOUS HCL 2 % MT SOLN
OROMUCOSAL | Status: AC
  Filled 2022-08-09: qty 15

## 2022-08-09 MED ORDER — LACTATED RINGERS IV SOLN: INTRAVENOUS | Status: DC | PRN

## 2022-08-09 MED ORDER — SODIUM CHLORIDE 0.9 % IV SOLN: INTRAVENOUS | Status: DC

## 2022-08-09 MED ORDER — PROPOFOL 10 MG/ML IV BOLUS
INTRAVENOUS | Status: DC | PRN
  Administered 2022-08-09: 20 mg via INTRAVENOUS
  Administered 2022-08-09: 50 mg via INTRAVENOUS

## 2022-08-09 MED ORDER — LIDOCAINE VISCOUS HCL 2 % MT SOLN: 15.0000 mL | Freq: Once | OROMUCOSAL | Status: DC

## 2022-08-09 MED ORDER — PROPOFOL 500 MG/50ML IV EMUL
INTRAVENOUS | Status: DC | PRN
  Administered 2022-08-09: 150 ug/kg/min via INTRAVENOUS

## 2022-08-09 NOTE — TOC Progression Note (Signed)
  Transition of Care [facility]) Screening Note   Patient Details  Name: Jared Bridges Date of Birth: 10/12/1930   Transition of Care [facility]) CM/SW Contact:    Boneta Lucks, RN Phone Number: 08/09/2022, 8:41 AM  Consult for SNF placement, Patient is bed bound. Needs PT eval. TOC will follow.  Transition of Care Department [facility]) has reviewed patient and no TOC needs have been identified at this time. We will continue to monitor patient advancement through interdisciplinary progression rounds. If new patient transition needs arise, please place a TOC consult.     Expected Discharge Plan: [place] Barriers to Discharge: Continued Medical Work up  Expected Discharge Plan and Services Expected Discharge Plan: [place]

## 2022-08-09 NOTE — Care Management Obs Status (Signed)
MEDICARE OBSERVATION STATUS NOTIFICATION   Patient Details  Name: Jared Bridges MRN: [id number] Date of Birth: 1930/04/10   Medicare Observation Status Notification Given:  Yes    Boneta Lucks, RN 08/09/2022, 4:16 PM

## 2022-08-09 NOTE — Progress Notes (Signed)
PROGRESS NOTE    [name]  YPP:[id number] DOB: 15-Aug-1930 DOA: 08/08/2022 PCP: [name], MD   Brief Narrative:    Jared Bridges is a 88 y.o. male with medical history significant of polymyalgia rheumatica, stroke, diabetes, dementia, hypertension, weakness, fatigue.  Patient was having some hypotension with confusion and weakness as well as melena.  He has been admitted with acute blood loss anemia in the setting of GI bleed and will need eventual SNF placement.  Assessment & Plan:   Principal Problem:   Iron deficiency anemia secondary to blood loss (chronic) Active Problems:   Essential hypertension   Iron deficiency anemia due to chronic blood loss   Upper GI bleed   Hypercholesterolemia   Benign prostatic hyperplasia with urinary obstruction   Essential (primary) hypertension   Prostate cancer ([name]   Diabetic peripheral neuropathy ([name]   Obstructive sleep apnea of adult   Controlled type 2 diabetes mellitus with hyperglycemia, without long-term current use of insulin ([name]   History of CVA (cerebrovascular accident)   Grade I diastolic dysfunction  Assessment and Plan:   Acute blood loss anemia due to suspected GI bleed No abdominal tenderness We will transfuse 2 units.  With history of cardiac disease, should get his hemoglobin up around 9 Recheck CBC in the morning Has iron deficiency anemia that is chronic from chronic prior bleeds Upper GI bleed GI consulted Keep n.p.o. PPI twice daily Probable scope later today Hypertension Continue antihypertensives Diabetes Sliding scale insulin History of CVA Grade 1 diastolic dysfunction   DVT prophylaxis: SCDs Code Status: Full Family Communication: None at bedside Disposition Plan:  Status is: Inpatient Remains inpatient appropriate because: Need for inpatient procedure and IV medications.  Skin Assessment:  I have examined the patient's skin and I agree with the wound assessment as performed by  the wound care RN as outlined below:  Pressure Injury 08/08/22 Buttocks Right Stage 2 -  Partial thickness loss of dermis presenting as a shallow open injury with a red, pink wound bed without slough. (Active)  08/08/22 2130  Location: Buttocks  Location Orientation: Right  Staging: Stage 2 -  Partial thickness loss of dermis presenting as a shallow open injury with a red, pink wound bed without slough.  Wound Description (Comments):   Present on Admission: Yes     Pressure Injury 08/08/22 Buttocks Left Stage 2 -  Partial thickness loss of dermis presenting as a shallow open injury with a red, pink wound bed without slough. (Active)  08/08/22 2130  Location: Buttocks  Location Orientation: Left  Staging: Stage 2 -  Partial thickness loss of dermis presenting as a shallow open injury with a red, pink wound bed without slough.  Wound Description (Comments):   Present on Admission: Yes    Consultants:  GI  Procedures:  None  Antimicrobials:  None   Subjective: Patient seen and evaluated today with no new acute complaints or concerns. No acute concerns or events noted overnight.  He appears slightly confused  Objective: Vitals:   08/08/22 2229 08/08/22 2306 08/09/22 0411 08/09/22 0903  BP: (!) 132/59 (!) 139/57 (!) 117/59 (!) 123/51  Pulse: 63 64 61 64  Resp: '18 18 19 20  '$ Temp: 97.7 F (36.5 C) 97.6 F (36.4 C) 98.1 F (36.7 C) 97.7 F (36.5 C)  TempSrc: Oral Oral Oral Oral  SpO2: 100% 100% 99% 100%  Weight:      Height:        Intake/Output Summary (Last 24  hours) at 08/09/2022 1026 Last data filed at 08/09/2022 0900 Gross per 24 hour  Intake 1619.41 ml  Output --  Net 1619.41 ml   Filed Weights   08/08/22 2138  Weight: 77.3 kg    Examination:  General exam: Appears calm and comfortable, pleasantly confused Respiratory system: Clear to auscultation. Respiratory effort normal.  Nasal cannula oxygen Cardiovascular system: S1 & S2 heard, RRR.  Gastrointestinal  system: Abdomen is soft Central nervous system: Alert and awake Extremities: No edema Skin: No significant lesions noted Psychiatry: Flat affect.    Data Reviewed: I have personally reviewed following labs and imaging studies  CBC: Recent Labs  Lab 08/08/22 1532 08/09/22 0416  WBC 9.7 8.2  HGB 7.1* 8.0*  HCT 24.6* 27.2*  MCV 96.1 95.4  PLT 254 287   Basic Metabolic Panel: Recent Labs  Lab 08/08/22 1532 08/09/22 0416  NA 140 142  K 3.8 3.9  CL 106 110  CO2 29 27  GLUCOSE 113* 96  BUN 13 12  CREATININE 0.75 0.74  CALCIUM 9.4 9.1   GFR: Estimated Creatinine Clearance: 67.1 mL/min (by C-G formula based on SCr of 0.74 mg/dL). Liver Function Tests: Recent Labs  Lab 08/08/22 1532  AST 10*  ALT 8  ALKPHOS 53  BILITOT 0.6  PROT 6.5  ALBUMIN 3.0*   No results for input(s): "LIPASE", "AMYLASE" in the last 168 hours. No results for input(s): "AMMONIA" in the last 168 hours. Coagulation Profile: No results for input(s): "INR", "PROTIME" in the last 168 hours. Cardiac Enzymes: No results for input(s): "CKTOTAL", "CKMB", "CKMBINDEX", "TROPONINI" in the last 168 hours. BNP (last 3 results) No results for input(s): "PROBNP" in the last 8760 hours. HbA1C: No results for input(s): "HGBA1C" in the last 72 hours. CBG: Recent Labs  Lab 08/08/22 2140  GLUCAP 100*   Lipid Profile: No results for input(s): "CHOL", "HDL", "LDLCALC", "TRIG", "CHOLHDL", "LDLDIRECT" in the last 72 hours. Thyroid Function Tests: No results for input(s): "TSH", "T4TOTAL", "FREET4", "T3FREE", "THYROIDAB" in the last 72 hours. Anemia Panel: No results for input(s): "VITAMINB12", "FOLATE", "FERRITIN", "TIBC", "IRON", "RETICCTPCT" in the last 72 hours. Sepsis Labs: Recent Labs  Lab 08/08/22 1532 08/08/22 1609  LATICACIDVEN 1.1 1.2    Recent Results (from the past 240 hour(s))  Blood culture (routine x 2)     Status: None (Preliminary result)   Collection Time: 08/08/22  2:09 PM    Specimen: BLOOD RIGHT HAND  Result Value Ref Range Status   Specimen Description BLOOD RIGHT HAND BOTTLES DRAWN AEROBIC ONLY  Final   Special Requests   Final    Blood Culture results may not be optimal due to an inadequate volume of blood received in culture bottles   Culture   Final    NO GROWTH < 12 HOURS Performed at [facility], [address] [place], [place] [address]    Report Status PENDING  Incomplete  Blood culture (routine x 2)     Status: None (Preliminary result)   Collection Time: 08/08/22  5:58 PM   Specimen: BLOOD RIGHT HAND  Result Value Ref Range Status   Specimen Description BLOOD RIGHT HAND BOTTLES DRAWN AEROBIC ONLY  Final   Special Requests   Final    Blood Culture results may not be optimal due to an inadequate volume of blood received in culture bottles   Culture   Final    NO GROWTH < 12 HOURS Performed at [facility], [address], [place],  [address]  Report Status PENDING  Incomplete         Radiology Studies: DG Chest 2 View  Result Date: 08/08/2022 CLINICAL DATA:  Shortness of breath with fatigue and generalized weakness. EXAM: CHEST - 2 VIEW COMPARISON:  07/13/2022 FINDINGS: Lungs are adequately inflated without focal airspace consolidation. Possible layering small effusion on the lateral film likely left-sided. Cardiomediastinal silhouette and remainder of the exam is unchanged. IMPRESSION: Possible posterior layering small left pleural effusion. Electronically Signed   By: Marin Olp M.D.   On: 08/08/2022 16:26        Scheduled Meds:  atorvastatin  40 mg Oral Daily   escitalopram  20 mg Oral Daily   finasteride  5 mg Oral Daily   pantoprazole (PROTONIX) IV  40 mg Intravenous Q12H   pregabalin  75 mg Oral BID   topiramate  25 mg Oral BID   Continuous Infusions:  sodium chloride 75 mL/hr at 08/09/22 0258     LOS: 1 day    Time spent: 35 minutes    Kristia Jupiter Darleen Crocker, DO [facility]  If 7PM-7AM, please contact  night-coverage [email or web address] 08/09/2022, 10:26 AM

## 2022-08-09 NOTE — Anesthesia Postprocedure Evaluation (Signed)
Anesthesia Post Note  Patient: Jared Bridges  Procedure(s) Performed: ESOPHAGOGASTRODUODENOSCOPY (EGD) WITH PROPOFOL  Patient location during evaluation: PACU Anesthesia Type: General Level of consciousness: awake, responds to stimulation and patient cooperative Pain management: pain level controlled Vital Signs Assessment: post-procedure vital signs reviewed and stable Respiratory status: spontaneous breathing, nonlabored ventilation, respiratory function stable and patient connected to nasal cannula oxygen Cardiovascular status: blood pressure returned to baseline and stable Postop Assessment: no apparent nausea or vomiting Anesthetic complications: no   No notable events documented.   Last Vitals:  Vitals:   08/09/22 1200 08/09/22 1205  BP: (!) 92/46 (!) 103/46  Pulse: 61 61  Resp: 18 17  Temp:    SpO2: 98% 100%    Last Pain:  Vitals:   08/09/22 1205  TempSrc:   PainSc: 0-No pain                 Gae Bihl C Chuck Caban

## 2022-08-09 NOTE — Anesthesia Preprocedure Evaluation (Addendum)
Anesthesia Evaluation  Patient identified by MRN, date of birth, ID band Patient awake    Reviewed: Allergy & Precautions, NPO status , Patient's Chart, lab work & pertinent test results  Airway Mallampati: III  TM Distance: >3 FB Neck ROM: Full    Dental  (+) Dental Advisory Given, Missing   Pulmonary sleep apnea and Oxygen sleep apnea , former smoker,           Cardiovascular Exercise Tolerance: Poor hypertension, Pt. on medications +CHF  + dysrhythmias + Valvular Problems/Murmurs  Rhythm:Regular Rate:Normal + Systolic murmurs [id number] 17:13:44 [facility] ROUTINE RECORD 13-Jun-1930 (46 yr) Male Caucasian Test ZPH:XTAVW Pain Vent. rate 65 BPM PR interval 214 ms QRS duration 92 ms QT/QTcB 458/476 ms P-R-T axes 62 -56 74 Sinus rhythm with 1st degree A-V block with Premature supraventricular complexes and with occasional Premature ventricular complexes Left anterior fascicular block Abnormal ECG When compared with ECG of 13-Jul-2022 13:03, PREVIOUS ECG IS PRESENT No sig changes Confirmed by Octaviano Glow [phone]) on 08/09/2022 11:00:13 AM   Neuro/Psych  Headaches, PSYCHIATRIC DISORDERS Anxiety Depression Dementia TIA Neuromuscular disease CVA    GI/Hepatic Neg liver ROS, PUD, GERD  Medicated and Controlled,  Endo/Other  diabetes, Well Controlled, Type 2  Renal/GU Renal disease  negative genitourinary   Musculoskeletal  (+) Arthritis , Osteoarthritis,    Abdominal   Peds negative pediatric ROS (+)  Hematology  (+) Blood dyscrasia, anemia ,   Anesthesia Other Findings   Reproductive/Obstetrics negative OB ROS                            Anesthesia Physical Anesthesia Plan  ASA: 4 and emergent  Anesthesia Plan: General   Post-op Pain Management: Minimal or no pain anticipated   Induction: Intravenous  PONV Risk Score and Plan: Propofol infusion  Airway  Management Planned: Nasal Cannula and Natural Airway  Additional Equipment:   Intra-op Plan:   Post-operative Plan: Possible Post-op intubation/ventilation  Informed Consent: I have reviewed the patients History and Physical, chart, labs and discussed the procedure including the risks, benefits and alternatives for the proposed anesthesia with the patient or authorized representative who has indicated his/her understanding and acceptance.     Dental advisory given  Plan Discussed with: Surgeon  Anesthesia Plan Comments:        Anesthesia Quick Evaluation

## 2022-08-09 NOTE — Brief Op Note (Signed)
08/08/2022 - 08/09/2022  12:02 PM  PATIENT:  [name]  88 y.o. male  PRE-OPERATIVE DIAGNOSIS:  melena  POST-OPERATIVE DIAGNOSIS:  hiatal hernia   PROCEDURE:  Procedure(s): ESOPHAGOGASTRODUODENOSCOPY (EGD) WITH PROPOFOL (N/A)  SURGEON:  Surgeon(s) and Role:    * Harvel Quale, MD - Primary  Patient underwent EGD under propofol sedation.  Tolerated the procedure adequately. A 1 cm hiatal hernia was present. The entire examined stomach was normal.  Upon careful inspection, no presence of hematin or active bleeding was present. The examined duodenum was normal.  Upon careful inspection, no presence of hematin or active bleeding was present.  RECOMMENDATIONS - Return patient to hospital ward for ongoing care.  - Clear liquid diet.  - Check H/H daily. - Will discuss possible colonoscopy with family tomorrow. - Decrease pantoprazole to 40 mg qday - Stop using high dose aspirin including Goody/BC powders, NSAIDs such as Aleve, ibuprofen, naproxen, Motrin, Voltaren or Advil (even the topical ones)  [name], MD Gastroenterology and Hepatology [facility]

## 2022-08-09 NOTE — Op Note (Signed)
[facility] Patient Name: Jared Bridges Procedure Date: 08/09/2022 11:03 AM MRN: [id number] Date of Birth: 12-Oct-1930 Attending MD: [name] ,  CSN: [id number] Age: 88 Admit Type: Inpatient Procedure:                Upper GI endoscopy Indications:              Iron deficiency anemia, Melena Providers:                [name], Janeece Riggers, RN, Kristine L.                            Risa Grill, Technician Referring MD:              Medicines:                Monitored Anesthesia Care Complications:            No immediate complications. Estimated Blood Loss:     Estimated blood loss: none. Procedure:                Pre-Anesthesia Assessment:                           - Prior to the procedure, a History and Physical                            was performed, and patient medications, allergies                            and sensitivities were reviewed. The patient's                            tolerance of previous anesthesia was reviewed.                           - The risks and benefits of the procedure and the                            sedation options and risks were discussed with the                            patient. All questions were answered and informed                            consent was obtained.                           - ASA Grade Assessment: III - A patient with severe                            systemic disease.                           After obtaining informed consent, the endoscope was                            passed under direct vision. Throughout the  procedure, the patient's blood pressure, pulse, and                            oxygen saturations were monitored continuously. The                            [id number]-H190 ([id number]) scope was introduced through the                            mouth, and advanced to the second part of duodenum.                            The upper GI endoscopy was accomplished without                             difficulty. The patient tolerated the procedure                            well. Scope In: 11:42:35 AM Scope Out: 11:45:57 AM Total Procedure Duration: 0 hours 3 minutes 22 seconds  Findings:      A 1 cm hiatal hernia was present.      The entire examined stomach was normal. Upon careful inspection, no       presence of hematin or active bleeding was present.      The examined duodenum was normal. Upon careful inspection, no presence       of hematin or active bleeding was present. Impression:               - 1 cm hiatal hernia.                           - Normal stomach.                           - Normal examined duodenum.                           - No specimens collected. Moderate Sedation:      Per Anesthesia Care Recommendation:           - Return patient to hospital ward for ongoing care.                           - Clear liquid diet.                           - Check H/H daily.                           - Will discuss possible colonoscopy with family                            tomorrow.                           - Decrease pantoprazole to 40 mg qday                           -  Stop using high dose aspirin including Goody/BC                            powders, NSAIDs such as Aleve, ibuprofen, naproxen,                            Motrin, Voltaren or Advil (even the topical ones) Procedure Code(s):        --- Professional ---                           [id number], Esophagogastroduodenoscopy, flexible,                            transoral; diagnostic, including collection of                            specimen(s) by brushing or washing, when performed                            (separate procedure) Diagnosis Code(s):        --- Professional ---                           K44.9, Diaphragmatic hernia without obstruction or                            gangrene                           D50.9, Iron deficiency anemia, unspecified                           K92.1, Melena  (includes Hematochezia) CPT copyright 2019 American Medical Association. All rights reserved. The codes documented in this report are preliminary and upon coder review may  be revised to meet current compliance requirements. [name], MD [name],  08/09/2022 12:03:53 PM This report has been signed electronically. Number of Addenda: 0

## 2022-08-09 NOTE — Consult Note (Addendum)
[name], M.D. Gastroenterology & Hepatology                                           Patient Name: Jared Bridges Account #: _0 @   MRN: [id number] Admission Date: 08/08/2022 Date of Evaluation:  08/09/2022 Time of Evaluation: 10:13 AM   Referring Physician: Heath Lark, DO  Chief Complaint: Melena and anemia  HPI:  This is a 88 y.o. male with history of depression, diverticulitis, history of gastric ulcers, PMR, stroke, TIA, diabetes, who came to the hospital after presenting worsening fatigue and weakness.    Patient is currently residing at his home but has home health care.  Patient is a poor historian and most of the history is obtained from the daughter (I reached her via phone call).  The daughter reported that he has been presenting more confusion and weakness recently.  It seems that for the last few weeks he has presented some episodes of melena.  He has been taking ibuprofen and Aleve intermittently, as well as Excedrin for pain in his body.  In the ED, he was HD stable although his blood pressure was soft and 101/48, and afebrile. Labs were remarkable for drop in his hemoglobin down to 7.1 with normal platelet count and white blood cell count, his MCV was normal at 96.  CMP showed normal BUN of 13, creatinine 0.75, normal electrolytes and renal function, normal liver function panel.  No recent iron stores in file although he got transfusion of 1 unit of PRBC overnight.  Last colonoscopy 2019 - Three small polyps in the ascending colon and in the cecum, removed with a cold snare. Resected and retrieved. - One small polyp in the ascending colon. Biopsied. - Patent end-to-side colo-colonic anastomosis, characterized by healthy appearing mucosa. - Diverticulosis in the sigmoid colon. - External hemorrhoids.  Last EGD possibly in 2005, unclear if he had ulcers at that time as no report is available but the daughter reports that he had multiple ulcers in the  past.  Past Medical History: SEE CHRONIC ISSSUES: Past Medical History:  Diagnosis Date   Anemia, iron deficiency    Chronic headaches    Migraines   Chronic kidney disease    Resolved   Depression    Diverticulitis    Essential hypertension    GI bleed    Dr. Laural Golden - 1998   Hypercholesteremia    Orthostatic hypotension    Peptic ulcer disease    Peripheral neuropathy    Polymyalgia rheumatica ([place])    Stroke ([place])    Residual trouble reading and writing   Syncope    Thyroid nodule    TIA (transient ischemic attack) 12/2017   Type 2 diabetes mellitus ([place])    Past Surgical History:  Past Surgical History:  Procedure Laterality Date   APPENDECTOMY     as child   BACK SURGERY     2000 IDET SPINAL PROC   CHOLECYSTECTOMY N/A 04/07/2016   Procedure: CHOLECYSTECTOMY;  Surgeon: Aviva Signs, MD;  Location: [facility];  Service: General;  Laterality: N/A;   [place]   COLONOSCOPY N/A 01/07/2018   Procedure: COLONOSCOPY;  Surgeon: Rogene Houston, MD;  Location: [facility];  Service: Endoscopy;  Laterality: N/A;  1:00   CRYOTHERAPY     EYE SURGERY  right cataract with lens implant   FEMUR SURGERY     RT LEG        1953   HAND SURGERY     RIGHT   HERNIA REPAIR     1-right inguinal, 3- left inguinal   IRRIGATION AND DEBRIDEMENT KNEE Left 07/09/2015   Procedure: LEFT KNEE IRRIGATION AND DEBRIDEMENT WOUND CLOSURE;  Surgeon: Gaynelle Arabian, MD;  Location: [facility];  Service: Orthopedics;  Laterality: Left;   JOINT REPLACEMENT  02/28/2016   lt knee revision   KNEE DEBRIDEMENT     2016   LEFT   MENISCUS REPAIR     LEFT   POLYPECTOMY  01/07/2018   Procedure: POLYPECTOMY;  Surgeon: Rogene Houston, MD;  Location: [facility];  Service: Endoscopy;;  colon    SHOULDER OPEN ROTATOR CUFF REPAIR     RT SHOULDER   SPINAL FUSION     SPINAL FUSION     TONSILLECTOMY     TOTAL KNEE ARTHROPLASTY Left 05/28/2015   Procedure: LEFT TOTAL KNEE  ARTHROPLASTY;  Surgeon: Gaynelle Arabian, MD;  Location: [facility];  Service: Orthopedics;  Laterality: Left;   TOTAL KNEE REVISION Left 02/28/2016   Procedure: LEFT TOTAL KNEE REVISION;  Surgeon: Leandrew Koyanagi, MD;  Location: [facility];  Service: Orthopedics;  Laterality: Left;   TRIGGER FINGER RELEASE     RT HAND   1990S   UVULECTOMY  2004   TURBINATE,TONSIL,ADENOID   Family History:  Family History  Problem Relation Age of Onset   Colon cancer Sister    Hypertension Other    Depression Brother    Depression Daughter    Social History:  Social History   Tobacco Use   Smoking status: Former    Packs/day: 0.10    Years: 18.00    Total pack years: 1.80    Types: Cigarettes    Start date: 12/18/1958    Quit date: 12/01/1986    Years since quitting: 35.7   Smokeless tobacco: Never  Vaping Use   Vaping Use: Never used  Substance Use Topics   Alcohol use: No    Alcohol/week: 0.0 standard drinks of alcohol   Drug use: No    Home Medications:  Prior to Admission medications   Medication Sig Start Date End Date Taking? Authorizing Provider  Apremilast (OTEZLA) 30 MG TABS Take 30 mg by mouth 2 (two) times daily.   Yes [provider]  atorvastatin (LIPITOR) 40 MG tablet Take 1 tablet (40 mg total) by mouth daily. 07/09/22  Yes Gerlene Fee, NP  Calcium 200 MG TABS Take 400 mg by mouth daily.   Yes [provider]  Cholecalciferol (VITAMIN D3) 250 MCG (10000 UT) TABS Take 1,000 Units by mouth daily.   Yes [provider]  escitalopram (LEXAPRO) 20 MG tablet Take 1 tablet (20 mg total) by mouth daily. 07/09/22  Yes Gerlene Fee, NP  ferrous sulfate 325 (65 FE) MG tablet Take 1 tablet (325 mg total) by mouth 3 (three) times a week. [place], wed, fri 07/09/22  Yes Gerlene Fee, NP  finasteride (PROSCAR) 5 MG tablet Take 1 tablet (5 mg total) by mouth daily. 07/09/22  Yes Gerlene Fee, NP  furosemide (LASIX) 20 MG tablet Take 1 tablet (20 mg total) by mouth every  other day. Patient taking differently: Take 20 mg by mouth every other day. Tuesday, Thursday and Saturday 07/09/22 08/08/22 Yes Gerlene Fee, NP  omeprazole (PRILOSEC) 40 MG capsule Take  1 capsule (40 mg total) by mouth daily. 07/09/22  Yes Gerlene Fee, NP  pregabalin (LYRICA) 75 MG capsule Take 1 capsule (75 mg total) by mouth 2 (two) times daily. 07/09/22  Yes Gerlene Fee, NP  topiramate (TOPAMAX) 50 MG tablet Take 0.5 tablets (25 mg total) by mouth 2 (two) times daily. 07/09/22  Yes Gerlene Fee, NP  vitamin B-12 (CYANOCOBALAMIN) 500 MCG tablet Take 1,000 mcg by mouth daily.   Yes [provider]  zolpidem (AMBIEN) 5 MG tablet Take 5-10 mg by mouth at bedtime as needed for sleep. 07/09/22  Yes [provider]  amoxicillin-clavulanate (AUGMENTIN) 875-125 MG tablet Take 1 tablet by mouth every 12 (twelve) hours. Patient not taking: Reported on 08/08/2022 07/16/22   Hendricks Limes, PA-C    Inpatient Medications:  Current Facility-Administered Medications:    0.9 %  sodium chloride infusion, , Intravenous, Continuous, Truett Mainland, DO, Last Rate: 75 mL/hr at 08/09/22 0258, Infusion Verify at 08/09/22 0258   atorvastatin (LIPITOR) tablet 40 mg, 40 mg, Oral, Daily, Truett Mainland, DO, 40 mg at 08/09/22 0853   escitalopram (LEXAPRO) tablet 20 mg, 20 mg, Oral, Daily, Truett Mainland, DO, 20 mg at 08/09/22 0854   finasteride (PROSCAR) tablet 5 mg, 5 mg, Oral, Daily, Truett Mainland, DO, 5 mg at 08/09/22 0854   ondansetron (ZOFRAN) tablet 4 mg, 4 mg, Oral, Q6H PRN **OR** ondansetron (ZOFRAN) injection 4 mg, 4 mg, Intravenous, Q6H PRN, Stinson, Jacob J, DO   pantoprazole (PROTONIX) injection 40 mg, 40 mg, Intravenous, Q12H, Stinson, Jacob J, DO, 40 mg at 08/09/22 0854   pregabalin (LYRICA) capsule 75 mg, 75 mg, Oral, BID, Truett Mainland, DO, 75 mg at 08/09/22 0854   topiramate (TOPAMAX) tablet 25 mg, 25 mg, Oral, BID, Stinson, Jacob J, DO, 25 mg at 08/09/22 4536    zolpidem (AMBIEN) tablet 5-10 mg, 5-10 mg, Oral, QHS PRN, Truett Mainland, DO Allergies: Iodine, Calan [verapamil], Cymbalta [duloxetine hcl], Iodinated contrast media, Latex, Lisinopril, Neurontin [gabapentin], Zoloft [sertraline hcl], Valium [diazepam], Flomax [tamsulosin hcl], Glimepiride, Melatonin, and Tape  Complete Review of Systems: GENERAL: negative for malaise, night sweats HEENT: No changes in hearing or vision, no nose bleeds or other nasal problems. NECK: Negative for lumps, goiter, pain and significant neck swelling RESPIRATORY: Negative for cough, wheezing CARDIOVASCULAR: Negative for chest pain, leg swelling, palpitations, orthopnea GI: SEE HPI MUSCULOSKELETAL: Negative for joint pain or swelling, back pain, and muscle pain. SKIN: Negative for lesions, rash PSYCH: Negative for sleep disturbance, mood disorder and recent psychosocial stressors. HEMATOLOGY Negative for prolonged bleeding, bruising easily, and swollen nodes. ENDOCRINE: Negative for cold or heat intolerance, polyuria, polydipsia and goiter. NEURO: negative for tremor, gait imbalance, syncope and seizures. The remainder of the review of systems is noncontributory.  Physical Exam: BP (!) 123/51 (BP Location: Left Arm)   Pulse 64   Temp 97.7 F (36.5 C) (Oral)   Resp 20   Ht _0  (1.88 m)   Wt 77.3 kg   SpO2 100%   BMI 21.88 kg/m  GENERAL: The patient is AO x2, in no acute distress. On supplemental O2 via [place]. Frail HEENT: Head is normocephalic and atraumatic. EOMI are intact. Mouth is well hydrated and without lesions. NECK: Supple. No masses LUNGS: Clear to auscultation. No presence of rhonchi/wheezing/rales. Adequate chest expansion HEART: RRR, normal s1 and s2. ABDOMEN: Soft, nontender, no guarding, no peritoneal signs, and nondistended. BS +. No masses. EXTREMITIES: Without any cyanosis,  clubbing, rash, lesions or edema. NEUROLOGIC: AOx2, has left sided paresis SKIN: no jaundice, no  rashes  Laboratory Data CBC:     Component Value Date/Time   WBC 8.2 08/09/2022 0416   RBC 2.85 (L) 08/09/2022 0416   HGB 8.0 (L) 08/09/2022 0416   HCT 27.2 (L) 08/09/2022 0416   PLT 241 08/09/2022 0416   MCV 95.4 08/09/2022 0416   MCH 28.1 08/09/2022 0416   MCHC 29.4 (L) 08/09/2022 0416   RDW 15.2 08/09/2022 0416   LYMPHSABS 0.7 07/13/2022 1325   MONOABS 0.8 07/13/2022 1325   EOSABS 0.2 07/13/2022 1325   BASOSABS 0.0 07/13/2022 1325   COAG:  Lab Results  Component Value Date   INR 1.3 (H) 06/16/2022   INR 1.1 04/27/2022   INR 1.1 07/01/2021    BMP:     Latest Ref Rng & Units 08/09/2022    4:16 AM 08/08/2022    3:32 PM 07/13/2022    1:25 PM  BMP  Glucose 70 - 99 mg/dL 96  113  118   BUN 8 - 23 mg/dL _0 Creatinine 0.61 - 1.24 mg/dL 0.74  0.75  0.80   Sodium 135 - 145 mmol/L 142  140  138   Potassium 3.5 - 5.1 mmol/L 3.9  3.8  4.2   Chloride 98 - 111 mmol/L 110  106  105   CO2 22 - 32 mmol/L _1 Calcium 8.9 - 10.3 mg/dL 9.1  9.4  9.1     HEPATIC:     Latest Ref Rng & Units 08/08/2022    3:32 PM 07/13/2022    1:25 PM 06/18/2022    3:59 AM  Hepatic Function  Total Protein 6.5 - 8.1 g/dL 6.5  6.0  5.0   Albumin 3.5 - 5.0 g/dL 3.0  2.8  2.0   AST 15 - 41 U/L _2 ALT 0 - 44 U/L _3 Alk Phosphatase 38 - 126 U/L 53  50  58   Total Bilirubin 0.3 - 1.2 mg/dL 0.6  0.8  0.6     CARDIAC:  Lab Results  Component Value Date   TROPONINI <0.03 12/28/2017     Imaging: I personally reviewed and interpreted the available imaging.  Assessment & Plan: Jared Bridges is a 87 y.o. male with history of depression, diverticulitis, history of gastric ulcers, PMR, stroke, TIA, diabetes, who came to the hospital after presenting worsening fatigue and weakness.  Patient was found to have worsening anemia with possible upper GI bleeding (unclear if his stools are black because of iron supplementation or because of ongoing losses).  His current  presentation is concerning for upper GI bleeding due to his chronic use of NSAIDs and high-dose aspirin.  I discussed this with the daughter, he should ideally avoid his medications in the future.  We will proceed with an EGD today, he should continue with PPI twice daily IV for now.  Phone consent was obtained today, nurse was witness during phone conversation with daughter.  - Repeat CBC qday, transfuse if Hb <8 - Pantoprazole 40 mg q12h IVP - 2 large bore IV lines - Active T/S - Keep NPO - Avoid NSAIDs and high dose aspirin - Will proceed with EGD today  Harvel Quale, MD Gastroenterology and Hepatology [facility]

## 2022-08-09 NOTE — Care Management CC44 (Signed)
Condition Code 44 Documentation Completed  Patient Details  Name: Jared Bridges MRN: [id number] Date of Birth: Jun 17, 1930   Condition Code 44 given:  Yes Patient signature on Condition Code 44 notice:  Yes Documentation of 2 MD's agreement:  Yes Code 44 added to claim:  Yes    Boneta Lucks, RN 08/09/2022, 4:16 PM

## 2022-08-09 NOTE — Transfer of Care (Signed)
Immediate Anesthesia Transfer of Care Note  Patient: Jared Bridges  Procedure(s) Performed: ESOPHAGOGASTRODUODENOSCOPY (EGD) WITH PROPOFOL  Patient Location: PACU  Anesthesia Type:General  Level of Consciousness: awake, sedated, drowsy and responds to stimulation  Airway & Oxygen Therapy: Patient Spontanous Breathing and Patient connected to nasal cannula oxygen  Post-op Assessment: Report given to RN and Post -op Vital signs reviewed and stable  Post vital signs: Reviewed and stable  Last Vitals:  Vitals Value Taken Time  BP 92/46 08/09/22 1200  Temp 36.9 C 08/09/22 1155  Pulse 60 08/09/22 1201  Resp 19 08/09/22 1201  SpO2 99 % 08/09/22 1201  Vitals shown include unvalidated device data.  Last Pain:  Vitals:   08/09/22 1155  TempSrc:   PainSc: 0-No pain         Complications: No notable events documented.

## 2022-08-10 ENCOUNTER — Encounter (HOSPITAL_COMMUNITY)
Admission: EM | Disposition: A | Discharge status: Home Health | Payer: Self-pay | Source: Home / Self Care | Attending: Internal Medicine

## 2022-08-10 ENCOUNTER — Observation Stay (HOSPITAL_COMMUNITY): Payer: Medicare Other

## 2022-08-10 DIAGNOSIS — R0603 Acute respiratory distress: Secondary | ICD-10-CM | POA: Diagnosis not present

## 2022-08-10 DIAGNOSIS — J9811 Atelectasis: Secondary | ICD-10-CM | POA: Diagnosis not present

## 2022-08-10 DIAGNOSIS — D500 Iron deficiency anemia secondary to blood loss (chronic): Secondary | ICD-10-CM | POA: Diagnosis not present

## 2022-08-10 DIAGNOSIS — D649 Anemia, unspecified: Secondary | ICD-10-CM | POA: Diagnosis not present

## 2022-08-10 LAB — CBC
HCT: 27.1 % — ABNORMAL LOW (ref 39.0–52.0)
Hemoglobin: 7.8 g/dL — ABNORMAL LOW (ref 13.0–17.0)
MCH: 27.9 pg (ref 26.0–34.0)
MCHC: 28.8 g/dL — ABNORMAL LOW (ref 30.0–36.0)
MCV: 96.8 fL (ref 80.0–100.0)
Platelets: 249 10*3/uL (ref 150–400)
RBC: 2.8 MIL/uL — ABNORMAL LOW (ref 4.22–5.81)
RDW: 15 % (ref 11.5–15.5)
WBC: 13.5 10*3/uL — ABNORMAL HIGH (ref 4.0–10.5)
nRBC: 0 % (ref 0.0–0.2)

## 2022-08-10 LAB — BASIC METABOLIC PANEL
Anion gap: 5 (ref 5–15)
BUN: 11 mg/dL (ref 8–23)
CO2: 27 mmol/L (ref 22–32)
Calcium: 8.7 mg/dL — ABNORMAL LOW (ref 8.9–10.3)
Chloride: 109 mmol/L (ref 98–111)
Creatinine, Ser: 0.72 mg/dL (ref 0.61–1.24)
GFR, Estimated: >60 mL/min (ref >60)
Glucose, Bld: 114 mg/dL — ABNORMAL HIGH (ref 70–99)
Potassium: 3.4 mmol/L — ABNORMAL LOW (ref 3.5–5.1)
Sodium: 141 mmol/L (ref 135–145)

## 2022-08-10 LAB — PROCALCITONIN: Procalcitonin: 0.15 ng/mL

## 2022-08-10 LAB — BRAIN NATRIURETIC PEPTIDE: B Natriuretic Peptide: 131 pg/mL — ABNORMAL HIGH (ref 0.0–100.0)

## 2022-08-10 LAB — MAGNESIUM: Magnesium: 1.8 mg/dL (ref 1.7–2.4)

## 2022-08-10 SURGERY — COLONOSCOPY WITH PROPOFOL
Anesthesia: Monitor Anesthesia Care

## 2022-08-10 MED ORDER — POTASSIUM CHLORIDE CRYS ER 20 MEQ PO TBCR
40.0000 meq | EXTENDED_RELEASE_TABLET | Freq: Once | ORAL | Status: AC
Start: 2022-08-10 — End: 2022-08-10
  Administered 2022-08-10: 40 meq via ORAL
  Filled 2022-08-10: qty 2

## 2022-08-10 MED ORDER — FUROSEMIDE 10 MG/ML IJ SOLN
20.0000 mg | Freq: Once | INTRAMUSCULAR | Status: AC
  Administered 2022-08-10: 20 mg via INTRAVENOUS
  Filled 2022-08-10: qty 2

## 2022-08-10 MED ORDER — PHENYLEPHRINE 80 MCG/ML (10ML) SYRINGE FOR IV PUSH (FOR BLOOD PRESSURE SUPPORT)
PREFILLED_SYRINGE | INTRAVENOUS | Status: AC
  Filled 2022-08-10: qty 10

## 2022-08-10 NOTE — Progress Notes (Signed)
PROGRESS NOTE    Jared Bridges  YQI:[id number] DOB: [birth year] DOA: 08/08/2022 PCP: [name], MD   Brief Narrative:    Jared Bridges is a 88 y.o. male with medical history significant of polymyalgia rheumatica, stroke, diabetes, dementia, hypertension, weakness, fatigue.  Patient was having some hypotension with confusion and weakness as well as melena.  He has been admitted with acute blood loss anemia in the setting of GI bleed and will need eventual SNF placement.  He underwent EGD 9/9 with no significant findings and will have colonoscopy planned once he is medically stable to do so.  Assessment & Plan:   Principal Problem:   Iron deficiency anemia secondary to blood loss (chronic) Active Problems:   Essential hypertension   Iron deficiency anemia due to chronic blood loss   Upper GI bleed   Hypercholesterolemia   Benign prostatic hyperplasia with urinary obstruction   Essential (primary) hypertension   Prostate cancer ([name]   Diabetic peripheral neuropathy ([name]   Obstructive sleep apnea of adult   Controlled type 2 diabetes mellitus with hyperglycemia, without long-term current use of insulin ([name]   Acute blood loss anemia   History of CVA (cerebrovascular accident)   Symptomatic anemia   Grade I diastolic dysfunction   Acute respiratory distress   Atelectasis  Assessment and Plan:     Acute blood loss anemia due to suspected GI bleed No abdominal tenderness Status post 2 unit transfusion with stability noted Recheck CBC in the morning Has iron deficiency anemia that is chronic from chronic prior bleeds Upper GI bleed GI consulted with EGD 9/9 showing only hiatal hernia and colonoscopy pending once respiratory status improves and patient can tolerate prep Keep on clear liquid diet PPI twice daily GI to follow for colonoscopy Hypertension Continue antihypertensives Diabetes Sliding scale insulin History of CVA Grade 1 diastolic dysfunction Acute  hypoxemic respiratory failure -Given Lasix for diuresis -Does not appear to have pneumonia -Continue to monitor -IV fluid held     DVT prophylaxis: SCDs Code Status: Full Family Communication: None at bedside Disposition Plan:  Status is: Observation The patient will require care spanning > 2 midnights and should be moved to inpatient because: Due to some worsening hypoxemia and need for IV medications.    Skin Assessment:   I have examined the patient's skin and I agree with the wound assessment as performed by the wound care RN as outlined below:   Pressure Injury 08/08/22 Buttocks Right Stage 2 -  Partial thickness loss of dermis presenting as a shallow open injury with a red, pink wound bed without slough. (Active)  08/08/22 2130  Location: Buttocks  Location Orientation: Right  Staging: Stage 2 -  Partial thickness loss of dermis presenting as a shallow open injury with a red, pink wound bed without slough.  Wound Description (Comments):   Present on Admission: Yes     Pressure Injury 08/08/22 Buttocks Left Stage 2 -  Partial thickness loss of dermis presenting as a shallow open injury with a red, pink wound bed without slough. (Active)  08/08/22 2130  Location: Buttocks  Location Orientation: Left  Staging: Stage 2 -  Partial thickness loss of dermis presenting as a shallow open injury with a red, pink wound bed without slough.  Wound Description (Comments):   Present on Admission: Yes      Consultants:  GI   Procedures:  None   Antimicrobials:  None   Subjective: Patient seen and evaluated today with some hypoxemia  noted overnight and concern for possible pneumonia versus atelectasis.  IV fluid was discontinued and he was given some Lasix.  No further overt bleeding noted.  He did not drink his preparation for colonoscopy.  Objective: Vitals:   08/10/22 0302 08/10/22 0412 08/10/22 0507 08/10/22 0754  BP: (!) 136/55  (!) 159/68 (!) 125/54  Pulse: 70  76 65   Resp: (!) 26  (!) 22   Temp: 99.1 F (37.3 C)  98.5 F (36.9 C)   TempSrc: Oral  Oral   SpO2: 99% 97% 100% 99%  Weight:      Height:        Intake/Output Summary (Last 24 hours) at 08/10/2022 1353 Last data filed at 08/10/2022 0900 Gross per 24 hour  Intake 2087.81 ml  Output 1800 ml  Net 287.81 ml   Filed Weights   08/08/22 2138  Weight: 77.3 kg    Examination:  General exam: Appears calm and comfortable  Respiratory system: Clear to auscultation. Respiratory effort normal.  2 L nasal cannula oxygen Cardiovascular system: S1 & S2 heard, RRR.  Gastrointestinal system: Abdomen is soft Central nervous system: Alert and awake Extremities: No edema Skin: No significant lesions noted Psychiatry: Flat affect.    Data Reviewed: I have personally reviewed following labs and imaging studies  CBC: Recent Labs  Lab 08/08/22 1532 08/09/22 0416 08/10/22 0512  WBC 9.7 8.2 13.5*  HGB 7.1* 8.0* 7.8*  HCT 24.6* 27.2* 27.1*  MCV 96.1 95.4 96.8  PLT 254 241 366   Basic Metabolic Panel: Recent Labs  Lab 08/08/22 1532 08/09/22 0416 08/10/22 0512  NA 140 142 141  K 3.8 3.9 3.4*  CL 106 110 109  CO2 '29 27 27  '$ GLUCOSE 113* 96 114*  BUN '13 12 11  '$ CREATININE 0.75 0.74 0.72  CALCIUM 9.4 9.1 8.7*  MG  --   --  1.8   GFR: Estimated Creatinine Clearance: 67.1 mL/min (by C-G formula based on SCr of 0.72 mg/dL). Liver Function Tests: Recent Labs  Lab 08/08/22 1532  AST 10*  ALT 8  ALKPHOS 53  BILITOT 0.6  PROT 6.5  ALBUMIN 3.0*   No results for input(s): "LIPASE", "AMYLASE" in the last 168 hours. No results for input(s): "AMMONIA" in the last 168 hours. Coagulation Profile: No results for input(s): "INR", "PROTIME" in the last 168 hours. Cardiac Enzymes: No results for input(s): "CKTOTAL", "CKMB", "CKMBINDEX", "TROPONINI" in the last 168 hours. BNP (last 3 results) No results for input(s): "PROBNP" in the last 8760 hours. HbA1C: No results for input(s): "HGBA1C"  in the last 72 hours. CBG: Recent Labs  Lab 08/08/22 2140 08/09/22 1134  GLUCAP 100* 93   Lipid Profile: No results for input(s): "CHOL", "HDL", "LDLCALC", "TRIG", "CHOLHDL", "LDLDIRECT" in the last 72 hours. Thyroid Function Tests: No results for input(s): "TSH", "T4TOTAL", "FREET4", "T3FREE", "THYROIDAB" in the last 72 hours. Anemia Panel: No results for input(s): "VITAMINB12", "FOLATE", "FERRITIN", "TIBC", "IRON", "RETICCTPCT" in the last 72 hours. Sepsis Labs: Recent Labs  Lab 08/08/22 1532 08/08/22 1609 08/10/22 0512  PROCALCITON  --   --  0.15  LATICACIDVEN 1.1 1.2  --     Recent Results (from the past 240 hour(s))  Blood culture (routine x 2)     Status: None (Preliminary result)   Collection Time: 08/08/22  2:09 PM   Specimen: BLOOD RIGHT HAND  Result Value Ref Range Status   Specimen Description BLOOD RIGHT HAND BOTTLES DRAWN AEROBIC ONLY  Final   Special Requests  Final    Blood Culture results may not be optimal due to an inadequate volume of blood received in culture bottles   Culture   Final    NO GROWTH 2 DAYS Performed at [facility], [address] [place], [place] [address]    Report Status PENDING  Incomplete  Urine Culture     Status: Abnormal (Preliminary result)   Collection Time: 08/08/22  2:09 PM   Specimen: Urine, Clean Catch  Result Value Ref Range Status   Specimen Description   Final    URINE, CLEAN CATCH Performed at [facility], [address] [place], [place] [address]    Special Requests   Final    NONE Performed at [facility], [address], [place], [place] [address]    Culture (A)  Final    20,000 COLONIES/mL KLEBSIELLA PNEUMONIAE SUSCEPTIBILITIES TO FOLLOW Performed at [facility], [place] [address] [place], [place] [address]    Report Status PENDING  Incomplete  Blood culture (routine x 2)     Status: None (Preliminary result)   Collection Time: 08/08/22  5:58 PM   Specimen: BLOOD RIGHT HAND  Result Value Ref  Range Status   Specimen Description BLOOD RIGHT HAND BOTTLES DRAWN AEROBIC ONLY  Final   Special Requests   Final    Blood Culture results may not be optimal due to an inadequate volume of blood received in culture bottles   Culture   Final    NO GROWTH 2 DAYS Performed at [facility], [address], [place], [place] [address]    Report Status PENDING  Incomplete         Radiology Studies: DG CHEST PORT 1 VIEW  Result Date: 08/10/2022 CLINICAL DATA:  Dyspnea EXAM: PORTABLE CHEST 1 VIEW COMPARISON:  Radiographs 08/08/2022 FINDINGS: New retrocardiac atelectasis/consolidation. Remainder unchanged. Interstitial coarsening. Stable cardiomediastinal silhouette. Aortic calcification. No definite pleural effusion or pneumothorax. No acute osseous abnormality. IMPRESSION: Retrocardiac atelectasis/consolidation, new from prior. Pneumonia is difficult to exclude. Electronically Signed   By: Placido Sou M.D.   On: 08/10/2022 03:02   DG Chest 2 View  Result Date: 08/08/2022 CLINICAL DATA:  Shortness of breath with fatigue and generalized weakness. EXAM: CHEST - 2 VIEW COMPARISON:  07/13/2022 FINDINGS: Lungs are adequately inflated without focal airspace consolidation. Possible layering small effusion on the lateral film likely left-sided. Cardiomediastinal silhouette and remainder of the exam is unchanged. IMPRESSION: Possible posterior layering small left pleural effusion. Electronically Signed   By: Marin Olp M.D.   On: 08/08/2022 16:26        Scheduled Meds:  atorvastatin  40 mg Oral Daily   escitalopram  20 mg Oral Daily   finasteride  5 mg Oral Daily   pantoprazole (PROTONIX) IV  40 mg Intravenous Q12H   pregabalin  75 mg Oral BID   topiramate  25 mg Oral BID    LOS: 1 day    Time spent: 35 minutes    Gladine Plude Darleen Crocker, DO [facility]  If 7PM-7AM, please contact night-coverage [email or web address] 08/10/2022, 1:53 PM

## 2022-08-10 NOTE — Progress Notes (Addendum)
While cleaning pt up pt stated "I cant get my breath" repositioned pt in upright position increased O2 to 5 L [place], pulse-ox reading initially 75%, did some deep breathing and coughing and O2 came up to 88% on 5L [place].  Notified Dr. Josephine Cables and turned IVF off.  Notified RT as well. CXR ordered and keep IVF off, per Dr. Josephine Cables.

## 2022-08-10 NOTE — Progress Notes (Signed)
TOC CSW waiting on PT eval for recommendations before workup for SNF.  Dakari Cregger Tarpley-Carter, MSW, LCSW-A Pronouns:  She/Her/Hers [facility]Transitions of Care Clinical Social Worker Direct Number:  [phone] [email or web address]

## 2022-08-10 NOTE — Progress Notes (Signed)
Dr. Josephine Cables at bedside, assessed pt, he will place order for '20mg'$  Lasix and pro/cal lab for this morning.

## 2022-08-10 NOTE — Progress Notes (Signed)
Jared Bridges, M.D. Gastroenterology & Hepatology   Interval History:  Patient became short of breath overnight and had increasing requirement of oxygen supplementation, was put on 5 L/min initially but this was decreased to 3 L/min overnight. Patient states feeling well and denies having any complaints, no melena or hematochezia reported per nursing staff.  Notably, he drank only one quarter of his bowel prep and had very scant amount of bowel movements yesterday. Hemoglobin remains relatively stable at 7.8 today.  However, he had leukocytosis of 13,000 with negative procalcitonin of 0.15. Chest x-ray showed retrocardiac consolidation concerning for possible pneumonia versus atelectasis.  Inpatient Medications:  Current Facility-Administered Medications:    atorvastatin (LIPITOR) tablet 40 mg, 40 mg, Oral, Daily, Jared Mainland, DO, 40 mg at 08/10/22 0802   escitalopram (LEXAPRO) tablet 20 mg, 20 mg, Oral, Daily, Jared Mainland, DO, 20 mg at 08/10/22 0802   finasteride (PROSCAR) tablet 5 mg, 5 mg, Oral, Daily, Jared Mainland, DO, 5 mg at 08/10/22 0802   ondansetron (ZOFRAN) tablet 4 mg, 4 mg, Oral, Q6H PRN **OR** ondansetron (ZOFRAN) injection 4 mg, 4 mg, Intravenous, Q6H PRN, Bridges, Jared J, DO   pantoprazole (PROTONIX) injection 40 mg, 40 mg, Intravenous, Q12H, Bridges, Jared J, DO, 40 mg at 08/10/22 0802   pregabalin (LYRICA) capsule 75 mg, 75 mg, Oral, BID, Jared Mainland, DO, 75 mg at 08/10/22 0802   topiramate (TOPAMAX) tablet 25 mg, 25 mg, Oral, BID, Jared Mainland, DO, 25 mg at 08/10/22 0802   zolpidem (AMBIEN) tablet 5-10 mg, 5-10 mg, Oral, QHS PRN, Jared Mainland, DO   I/O    Intake/Output Summary (Last 24 hours) at 08/10/2022 1049 Last data filed at 08/10/2022 0752 Gross per 24 hour  Intake 2487.81 ml  Output 1800 ml  Net 687.81 ml     Physical Exam: Temp:  [97.4 F (36.3 C)-99.1 F (37.3 C)] 98.5 F (36.9 C) (09/10 0507) Pulse Rate:  [59-76] 65 (09/10  0754) Resp:  [15-26] 22 (09/10 0507) BP: (90-159)/(46-68) 125/54 (09/10 0754) SpO2:  [75 %-100 %] 99 % (09/10 0754)  Temp (24hrs), Avg:98.4 F (36.9 C), Min:97.4 F (36.3 C), Max:99.1 F (37.3 C) GENERAL: The patient is AO x2, in no acute distress. On supplemental O2 via [place]. Frail HEENT: Head is normocephalic and atraumatic. EOMI are intact. Mouth is well hydrated and without lesions. NECK: Supple. No masses LUNGS: Clear to auscultation. No presence of rhonchi/wheezing/rales. Adequate chest expansion HEART: RRR, normal s1 and s2. ABDOMEN: Soft, nontender, no guarding, no peritoneal signs, and nondistended. BS +. No masses. EXTREMITIES: Without any cyanosis, clubbing, rash, lesions or edema. NEUROLOGIC: AOx2, has left sided paresis SKIN: no jaundice, no rashes  Laboratory Data: CBC:     Component Value Date/Time   WBC 13.5 (H) 08/10/2022 0512   RBC 2.80 (L) 08/10/2022 0512   HGB 7.8 (L) 08/10/2022 0512   HCT 27.1 (L) 08/10/2022 0512   PLT 249 08/10/2022 0512   MCV 96.8 08/10/2022 0512   MCH 27.9 08/10/2022 0512   MCHC 28.8 (L) 08/10/2022 0512   RDW 15.0 08/10/2022 0512   LYMPHSABS 0.7 07/13/2022 1325   MONOABS 0.8 07/13/2022 1325   EOSABS 0.2 07/13/2022 1325   BASOSABS 0.0 07/13/2022 1325   COAG:  Lab Results  Component Value Date   INR 1.3 (H) 06/16/2022   INR 1.1 04/27/2022   INR 1.1 07/01/2021    BMP:     Latest Ref Rng & Units 08/10/2022  5:12 AM 08/09/2022    4:16 AM 08/08/2022    3:32 PM  BMP  Glucose 70 - 99 mg/dL 114  96  113   BUN 8 - 23 mg/dL '11  12  13   ' Creatinine 0.61 - 1.24 mg/dL 0.72  0.74  0.75   Sodium 135 - 145 mmol/L 141  142  140   Potassium 3.5 - 5.1 mmol/L 3.4  3.9  3.8   Chloride 98 - 111 mmol/L 109  110  106   CO2 22 - 32 mmol/L '27  27  29   ' Calcium 8.9 - 10.3 mg/dL 8.7  9.1  9.4     HEPATIC:     Latest Ref Rng & Units 08/08/2022    3:32 PM 07/13/2022    1:25 PM 06/18/2022    3:59 AM  Hepatic Function  Total Protein 6.5 - 8.1 g/dL 6.5   6.0  5.0   Albumin 3.5 - 5.0 g/dL 3.0  2.8  2.0   AST 15 - 41 U/L '10  13  26   ' ALT 0 - 44 U/L '8  12  14   ' Alk Phosphatase 38 - 126 U/L 53  50  58   Total Bilirubin 0.3 - 1.2 mg/dL 0.6  0.8  0.6     CARDIAC:  Lab Results  Component Value Date   TROPONINI <0.03 12/28/2017      Imaging: I personally reviewed and interpreted the available labs, imaging and endoscopic files.   Assessment/Plan: Jared Bridges is a 88 y.o. male with history of depression, diverticulitis, history of gastric ulcers, PMR, stroke, TIA, diabetes, who came to the hospital after presenting worsening fatigue and weakness.  Patient was found to have worsening anemia with possible upper GI bleeding (unclear if his stools were black because of iron supplementation or because of ongoing losses).  Given the concern for upper GI bleeding he underwent an EGD yesterday which was within normal limits, only showing a hiatal hernia.  The patient was scheduled to undergo a colonoscopy for evaluation of his anemia and possible gastrointestinal bleeding but he presented desaturation overnight with concern for possible pneumonia versus atelectasis.  Currently he is doing better clinically but he did not drink his prep. I discussed this with the daughter who understood.  We will hold off on proceeding with his colonoscopy tomorrow and we will reschedule it once his respiratory status improves.  I also emphasized to the daughter the importance of helping him drinking the prep as this will help [name] have an adequate evaluation of his colon.   - Repeat CBC qday, transfuse if Hb <8 - Pantoprazole 40 mg qday - 2 large bore IV lines - Active T/S -Clear liquid diet for now - Avoid NSAIDs and high dose aspirin - Will proceed with colonoscopy in the future, once respiratory status improves and he is able to tolerate bowel prep  Jared Peppers, MD Gastroenterology and Hepatology [facility]

## 2022-08-10 NOTE — Progress Notes (Addendum)
Progress Note  RN called due to patient requiring 5 L of supplemental oxygen via [place] with an O2 sat of 88%.  He was on IV fluid and this was stopped.  Chest x-ray was done and showed  Retrocardiac atelectasis/consolidation, new from prior. Pneumonia is difficult to exclude.  BP (!) 136/55 (BP Location: Left Arm)   Pulse 70   Temp 99.1 F (37.3 C) (Oral)   Resp (!) 26   Ht '6\' 2"'$  (1.88 m)   Wt 77.3 kg   SpO2 97%   BMI 21.88 kg/m    Physical Exam  Gen:- Awake Alert, ill appearing but in no acute distress HEENT:- [place].AT, No sclera icterus Neck-Supple Neck,No JVD,.  Lungs-tachypnea.  Coarse breath sounds.  No wheezes CV- S1, S2 normal, systolic murmur at the left second costal space Abd-  +ve B.Sounds, Abd Soft, No tenderness,    Extremity/Skin:- No  edema,    Psych-affect is appropriate, oriented x3 Neuro-no new focal deficits, no tremors  Assessment and Plan Acute Respiratory distress Patient had a temporary respiratory distress in which he required supplemental oxygen via [place] at 5 LPM, this has since improved to 3 LPM Patient was on IV hydration, this was stopped and IV Lasix 20 mg x 1 was given Continue telemetry Continue to monitor patient and treat accordingly  Atelectasis Continue incentive spirometry  Questionable pneumonia Chest x-ray was suggestive of positive pneumonia Patient had no cough, WBC was normal, no fever and no other symptoms to suspect pneumonia Procalcitonin will be checked prior to starting patient on albumin IV antibiotics   Total time: 33 minutes

## 2022-08-10 NOTE — Progress Notes (Signed)
Pt feels recovered, VSS.

## 2022-08-11 DIAGNOSIS — D62 Acute posthemorrhagic anemia: Secondary | ICD-10-CM | POA: Diagnosis not present

## 2022-08-11 DIAGNOSIS — D649 Anemia, unspecified: Secondary | ICD-10-CM

## 2022-08-11 DIAGNOSIS — D500 Iron deficiency anemia secondary to blood loss (chronic): Secondary | ICD-10-CM | POA: Diagnosis not present

## 2022-08-11 LAB — BASIC METABOLIC PANEL
Anion gap: 4 — ABNORMAL LOW (ref 5–15)
BUN: 8 mg/dL (ref 8–23)
CO2: 28 mmol/L (ref 22–32)
Calcium: 8.7 mg/dL — ABNORMAL LOW (ref 8.9–10.3)
Chloride: 108 mmol/L (ref 98–111)
Creatinine, Ser: 0.76 mg/dL (ref 0.61–1.24)
GFR, Estimated: >60 mL/min (ref >60)
Glucose, Bld: 98 mg/dL (ref 70–99)
Potassium: 3.2 mmol/L — ABNORMAL LOW (ref 3.5–5.1)
Sodium: 140 mmol/L (ref 135–145)

## 2022-08-11 LAB — MAGNESIUM: Magnesium: 1.7 mg/dL (ref 1.7–2.4)

## 2022-08-11 LAB — URINE CULTURE: Culture: 20000 — AB

## 2022-08-11 LAB — CBC
HCT: 25.7 % — ABNORMAL LOW (ref 39.0–52.0)
Hemoglobin: 7.7 g/dL — ABNORMAL LOW (ref 13.0–17.0)
MCH: 28.5 pg (ref 26.0–34.0)
MCHC: 30 g/dL (ref 30.0–36.0)
MCV: 95.2 fL (ref 80.0–100.0)
Platelets: 231 10*3/uL (ref 150–400)
RBC: 2.7 MIL/uL — ABNORMAL LOW (ref 4.22–5.81)
RDW: 15 % (ref 11.5–15.5)
WBC: 7 10*3/uL (ref 4.0–10.5)
nRBC: 0 % (ref 0.0–0.2)

## 2022-08-11 MED ORDER — ACETAMINOPHEN 325 MG PO TABS
650.0000 mg | ORAL_TABLET | Freq: Four times a day (QID) | ORAL | Status: DC | PRN
  Administered 2022-08-11 – 2022-08-13 (×2): 650 mg via ORAL
  Filled 2022-08-11 (×2): qty 2

## 2022-08-11 MED ORDER — POTASSIUM CHLORIDE CRYS ER 20 MEQ PO TBCR
40.0000 meq | EXTENDED_RELEASE_TABLET | Freq: Two times a day (BID) | ORAL | Status: DC
  Administered 2022-08-11 (×2): 40 meq via ORAL
  Filled 2022-08-11 (×2): qty 2

## 2022-08-11 MED ORDER — PEG 3350-KCL-NA BICARB-NACL 420 G PO SOLR
4000.0000 mL | Freq: Once | ORAL | Status: AC
  Administered 2022-08-11: 4000 mL via ORAL

## 2022-08-11 NOTE — NC FL2 (Signed)
[place] LEVEL OF CARE SCREENING TOOL     IDENTIFICATION  Patient Name: Jared Bridges Birthdate: 1930/07/09 Sex: male Admission Date (Current Location): 08/08/2022  [facility] and [place] Number:  [employer] and Address:  [facility] [address] [place]      Provider Number: [id number]  Attending Physician Name and Address:  Rodena Goldmann, DO  Relative Name and Phone Number:       Current Level of Care: Hospital Recommended Level of Care: [place] Prior Approval Number:    Date Approved/Denied:   PASRR Number: [id number]  Discharge Plan: SNF    Current Diagnoses: Patient Active Problem List   Diagnosis Date Noted   Acute respiratory distress 08/10/2022   Atelectasis 08/10/2022   Iron deficiency anemia secondary to blood loss (chronic) 08/08/2022   Symptomatic anemia 07/01/2022   Grade I diastolic dysfunction 62/94/7654   Hyperlipidemia associated with type 2 diabetes mellitus ([place]) 06/26/2022   Hypertension associated with type 2 diabetes mellitus ([place]) 06/26/2022   History of CVA (cerebrovascular accident) 06/26/2022   Vascular dementia without behavioral disturbance ([place]) 06/26/2022   Neurodegenerative cognitive impairment ([place]) 06/26/2022   Normochromic normocytic anemia    Septic shock ([place]) 06/16/2022   Lactic acidosis 06/16/2022   Mixed hyperlipidemia 06/16/2022   Chronic diastolic CHF (congestive heart failure) ([place]) 06/16/2022   GERD (gastroesophageal reflux disease) 06/16/2022   Sepsis due to undetermined organism ([place])    Loss of weight 03/10/2022   History of peptic ulcer disease 09/03/2021   Wheelchair dependence 09/02/2021   Quadriplegia ([place]) 09/02/2021   Sleep disturbance    Acute blood loss anemia    Controlled type 2 diabetes mellitus with hyperglycemia, without long-term current use of insulin ([place])    Central cord syndrome at C7 level of cervical spinal cord ([name]  07/05/2021   Epidural hematoma ([name] 07/02/2021   Lower urinary tract symptoms (LUTS) 10/31/2020   Prostate cancer ([place]) 06/20/2020   Elevated PSA 04/18/2020   Cobalamin deficiency 02/22/2020   Intractable chronic migraine without aura 02/20/2020   Altered mental status 02/20/2020   Asthenia 02/20/2020   Diabetic peripheral neuropathy ([place]) 02/20/2020   Obstructive sleep apnea of adult 02/20/2020   Medicare annual wellness visit, subsequent 12/07/2019   Depressed mood 12/07/2019   Vitamin D deficiency 10/25/2019   Memory loss 10/25/2019   Fall 10/25/2019   Nonrheumatic aortic valve stenosis 03/23/2018   Migraine headaches 12/29/2017   TIA (transient ischemic attack) 12/28/2017   Rectal bleeding 11/26/2017   Carpal tunnel syndrome, left 07/22/2017   Acquired trigger finger 07/22/2017   Acute cholecystitis 04/05/2016   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[place]) 02/28/2016   Total knee replacement status 02/28/2016   CVA (cerebral infarction) 12/19/2015   Carotid stenosis 12/19/2015   Postoperative wound dehiscence 07/09/2015   Cellulitis of knee, left 06/29/2015   Wound infection after surgery 06/29/2015   OA (osteoarthritis) of knee 05/28/2015   BRBPR (bright red blood per rectum) 11/21/2014   Orthostatic hypotension 11/21/2014   Dizziness 11/16/2014   Polymyalgia rheumatica ([place]) 09/12/2014   Urinary tract infection with hematuria 08/16/2014   Encephalopathy 08/16/2014   Leukocytosis 08/16/2014   Iron deficiency anemia due to chronic blood loss 08/16/2014   DJD (degenerative joint disease) 08/16/2014   Essential hypertension    Cataract, nuclear 04/11/2014   Complaining of back-related symptom 03/15/2014   Arthralgia of hip or thigh 03/15/2014   Gonalgia 10/10/2013   Left knee pain  10/10/2013   1st degree AV block 08/04/2013   Infection of the upper respiratory tract 04/05/2013   Cataract 02/24/2013   Pseudoaphakia 02/24/2013   Episode of syncope 01/11/2013    Cardiac murmur 11/09/2012   Near syncope 11/09/2012   Anxiety 06/18/2012   Depression, recurrent ([place]) 06/18/2012   Upper GI bleed 06/18/2012   Diverticulitis 06/18/2012   Difficulty hearing 06/18/2012   Adaptive colitis 06/18/2012   Arthritis, degenerative 06/18/2012   Hearing impairment 06/18/2012   Multinodular goiter 01/29/2012   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   Adenoma of large intestine 05/31/2011   Cephalalgia 05/31/2011   Hypercholesterolemia 05/31/2011   Benign prostatic hyperplasia with urinary obstruction 05/31/2011   LBP (low back pain) 05/31/2011   Essential (primary) hypertension 05/31/2011   Headache(784.0) 05/31/2011   Hyperplasia of prostate 05/31/2011    Orientation RESPIRATION BLADDER Height & Weight     Self, Time, Situation, Place  O2 (2L) Incontinent, External catheter Weight: 170 lb 6.7 oz (77.3 kg) Height:  '6\' 2"'$  (188 cm)  BEHAVIORAL SYMPTOMS/MOOD NEUROLOGICAL BOWEL NUTRITION STATUS      Incontinent Diet (See D/C Summary)  AMBULATORY STATUS COMMUNICATION OF NEEDS Skin   Extensive Assist Verbally Other (Comment) (right and left buttocks stage 2)                       Personal Care Assistance Level of Assistance  Bathing, Feeding, Dressing Bathing Assistance: Maximum assistance Feeding assistance: Limited assistance Dressing Assistance: Maximum assistance     Functional Limitations Info  Sight, Hearing, Speech Sight Info: Adequate Hearing Info: Adequate Speech Info: Adequate    SPECIAL CARE FACTORS FREQUENCY  PT (By licensed PT), OT (By licensed OT)     PT Frequency: 5 times weekly OT Frequency: 5 times weekly            Contractures Contractures Info: Not present    Additional Factors Info  Code Status, Allergies Code Status Info: FULL Allergies Info: Iodine, Calan (Verapamil), Cymbalta (Duloxetine Hcl), Iodinated Contrast Media, Latex, Lisinopril, Neurontin (Gabapentin), Zoloft (Sertraline Hcl), Valium (Diazepam), Flomax (Tamsulosin  Hcl), Glimepiride, Melatonin, Tape           Current Medications (08/11/2022):  This is the current hospital active medication list Current Facility-Administered Medications  Medication Dose Route Frequency Provider Last Rate Last Admin   atorvastatin (LIPITOR) tablet 40 mg  40 mg Oral Daily Truett Mainland, DO   40 mg at 08/11/22 0904   escitalopram (LEXAPRO) tablet 20 mg  20 mg Oral Daily Truett Mainland, DO   20 mg at 08/11/22 1610   finasteride (PROSCAR) tablet 5 mg  5 mg Oral Daily Truett Mainland, DO   5 mg at 08/11/22 0904   ondansetron (ZOFRAN) tablet 4 mg  4 mg Oral Q6H PRN Truett Mainland, DO       Or   ondansetron [facility]) injection 4 mg  4 mg Intravenous Q6H PRN Truett Mainland, DO       pantoprazole (PROTONIX) injection 40 mg  40 mg Intravenous Q12H Truett Mainland, DO   40 mg at 08/11/22 0859   potassium chloride SA (KLOR-CON M) CR tablet 40 mEq  40 mEq Oral BID Heath Lark D, DO   40 mEq at 08/11/22 0904   pregabalin (LYRICA) capsule 75 mg  75 mg Oral BID Truett Mainland, DO   75 mg at 08/11/22 9604   topiramate (TOPAMAX) tablet 25 mg  25 mg Oral BID Truett Mainland, DO  25 mg at 08/11/22 0904   zolpidem (AMBIEN) tablet 5-10 mg  5-10 mg Oral QHS PRN Truett Mainland, DO   5 mg at 08/10/22 2229     Discharge Medications: Please see discharge summary for a list of discharge medications.  Relevant Imaging Results:  Relevant Lab Results:   Additional Information SSN: [id number] [place]

## 2022-08-11 NOTE — Progress Notes (Signed)
PROGRESS NOTE    Jared Bridges  NUU:[id number] DOB: 24-Mar-1930 DOA: 08/08/2022 PCP: [name], MD   Brief Narrative:    Jared Bridges is a 88 y.o. male with medical history significant of polymyalgia rheumatica, stroke, diabetes, dementia, hypertension, weakness, fatigue.  Patient was having some hypotension with confusion and weakness as well as melena.  He has been admitted with acute blood loss anemia in the setting of GI bleed and will need eventual SNF placement.  He underwent EGD 9/9 with no significant findings and will have colonoscopy planned once he is medically stable to do so.  His respiratory status appears improved.  Assessment & Plan:   Principal Problem:   Iron deficiency anemia secondary to blood loss (chronic) Active Problems:   Essential hypertension   Iron deficiency anemia due to chronic blood loss   Upper GI bleed   Hypercholesterolemia   Benign prostatic hyperplasia with urinary obstruction   Essential (primary) hypertension   Prostate cancer ([name]   Diabetic peripheral neuropathy ([name]   Obstructive sleep apnea of adult   Controlled type 2 diabetes mellitus with hyperglycemia, without long-term current use of insulin ([name]   Acute blood loss anemia   History of CVA (cerebrovascular accident)   Symptomatic anemia   Grade I diastolic dysfunction   Acute respiratory distress   Atelectasis  Assessment and Plan:   Acute blood loss anemia due to suspected GI bleed No abdominal tenderness Status post 2 unit transfusion with stability noted Recheck CBC in the morning Has iron deficiency anemia that is chronic from chronic prior bleeds Upper GI bleed GI consulted with EGD 9/9 showing only hiatal hernia and colonoscopy pending once respiratory status improves and patient can tolerate prep Keep on clear liquid diet PPI twice daily GI to follow for colonoscopy Hypertension Continue antihypertensives Diabetes Sliding scale insulin History of CVA Grade  1 diastolic dysfunction Acute hypoxemic respiratory failure-improved -Given Lasix for diuresis 9/10 -Does not appear to have pneumonia -Continue to monitor -IV fluid held and pt on CLD  Hypokalemia -Replete and reevaluate in a.m.     DVT prophylaxis: SCDs Code Status: Full Family Communication: None at bedside Disposition Plan:  Status is: Observation The patient will require care spanning > 2 midnights and should be moved to inpatient because: Due to some worsening hypoxemia and need for IV medications.     Skin Assessment:   I have examined the patient's skin and I agree with the wound assessment as performed by the wound care RN as outlined below:   Pressure Injury 08/08/22 Buttocks Right Stage 2 -  Partial thickness loss of dermis presenting as a shallow open injury with a red, pink wound bed without slough. (Active)  08/08/22 2130  Location: Buttocks  Location Orientation: Right  Staging: Stage 2 -  Partial thickness loss of dermis presenting as a shallow open injury with a red, pink wound bed without slough.  Wound Description (Comments):   Present on Admission: Yes     Pressure Injury 08/08/22 Buttocks Left Stage 2 -  Partial thickness loss of dermis presenting as a shallow open injury with a red, pink wound bed without slough. (Active)  08/08/22 2130  Location: Buttocks  Location Orientation: Left  Staging: Stage 2 -  Partial thickness loss of dermis presenting as a shallow open injury with a red, pink wound bed without slough.  Wound Description (Comments):   Present on Admission: Yes      Consultants:  GI   Procedures:  None   Antimicrobials:  None    Subjective: Patient seen and evaluated today with no new acute complaints or concerns. No acute concerns or events noted overnight.  He is eating breakfast this a.m. and respiratory status appears improved.  Objective: Vitals:   08/10/22 0754 08/10/22 1432 08/10/22 2054 08/11/22 0548  BP: (!) 125/54  (!)  111/43 (!) 109/50  Pulse: 65 (!) 57 (!) 58 (!) 56  Resp:    20  Temp:   98.6 F (37 C) 98.3 F (36.8 C)  TempSrc:   Oral Oral  SpO2: 99% 100% 100% 100%  Weight:      Height:        Intake/Output Summary (Last 24 hours) at 08/11/2022 1004 Last data filed at 08/11/2022 0618 Gross per 24 hour  Intake 240 ml  Output 1650 ml  Net -1410 ml   Filed Weights   08/08/22 2138  Weight: 77.3 kg    Examination:  General exam: Appears calm and comfortable  Respiratory system: Clear to auscultation. Respiratory effort normal.  2 L nasal cannula Cardiovascular system: S1 & S2 heard, RRR.  Gastrointestinal system: Abdomen is soft Central nervous system: Alert and awake Extremities: No edema Skin: No significant lesions noted Psychiatry: Flat affect.    Data Reviewed: I have personally reviewed following labs and imaging studies  CBC: Recent Labs  Lab 08/08/22 1532 08/09/22 0416 08/10/22 0512 08/11/22 0454  WBC 9.7 8.2 13.5* 7.0  HGB 7.1* 8.0* 7.8* 7.7*  HCT 24.6* 27.2* 27.1* 25.7*  MCV 96.1 95.4 96.8 95.2  PLT 254 241 249 062   Basic Metabolic Panel: Recent Labs  Lab 08/08/22 1532 08/09/22 0416 08/10/22 0512 08/11/22 0454  NA 140 142 141 140  K 3.8 3.9 3.4* 3.2*  CL 106 110 109 108  CO2 '29 27 27 28  '$ GLUCOSE 113* 96 114* 98  BUN '13 12 11 8  '$ CREATININE 0.75 0.74 0.72 0.76  CALCIUM 9.4 9.1 8.7* 8.7*  MG  --   --  1.8 1.7   GFR: Estimated Creatinine Clearance: 67.1 mL/min (by C-G formula based on SCr of 0.76 mg/dL). Liver Function Tests: Recent Labs  Lab 08/08/22 1532  AST 10*  ALT 8  ALKPHOS 53  BILITOT 0.6  PROT 6.5  ALBUMIN 3.0*   No results for input(s): "LIPASE", "AMYLASE" in the last 168 hours. No results for input(s): "AMMONIA" in the last 168 hours. Coagulation Profile: No results for input(s): "INR", "PROTIME" in the last 168 hours. Cardiac Enzymes: No results for input(s): "CKTOTAL", "CKMB", "CKMBINDEX", "TROPONINI" in the last 168 hours. BNP  (last 3 results) No results for input(s): "PROBNP" in the last 8760 hours. HbA1C: No results for input(s): "HGBA1C" in the last 72 hours. CBG: Recent Labs  Lab 08/08/22 2140 08/09/22 1134  GLUCAP 100* 93   Lipid Profile: No results for input(s): "CHOL", "HDL", "LDLCALC", "TRIG", "CHOLHDL", "LDLDIRECT" in the last 72 hours. Thyroid Function Tests: No results for input(s): "TSH", "T4TOTAL", "FREET4", "T3FREE", "THYROIDAB" in the last 72 hours. Anemia Panel: No results for input(s): "VITAMINB12", "FOLATE", "FERRITIN", "TIBC", "IRON", "RETICCTPCT" in the last 72 hours. Sepsis Labs: Recent Labs  Lab 08/08/22 1532 08/08/22 1609 08/10/22 0512  PROCALCITON  --   --  0.15  LATICACIDVEN 1.1 1.2  --     Recent Results (from the past 240 hour(s))  Blood culture (routine x 2)     Status: None (Preliminary result)   Collection Time: 08/08/22  2:09 PM   Specimen: BLOOD RIGHT  HAND  Result Value Ref Range Status   Specimen Description BLOOD RIGHT HAND BOTTLES DRAWN AEROBIC ONLY  Final   Special Requests   Final    Blood Culture results may not be optimal due to an inadequate volume of blood received in culture bottles   Culture   Final    NO GROWTH 3 DAYS Performed at [facility], [address], [place], [place] [address]    Report Status PENDING  Incomplete  Urine Culture     Status: Abnormal   Collection Time: 08/08/22  2:09 PM   Specimen: Urine, Clean Catch  Result Value Ref Range Status   Specimen Description   Final    URINE, CLEAN CATCH Performed at [facility], [address] [place], [place] [address]    Special Requests   Final    NONE Performed at [facility], [address] [place], [place] [address]    Culture 20,000 COLONIES/mL KLEBSIELLA PNEUMONIAE (A)  Final   Report Status 08/11/2022 FINAL  Final   Organism ID, Bacteria KLEBSIELLA PNEUMONIAE (A)  Final      Susceptibility   Klebsiella pneumoniae - MIC*    AMPICILLIN >=32 RESISTANT Resistant     CEFAZOLIN  <=4 SENSITIVE Sensitive     CEFEPIME <=0.12 SENSITIVE Sensitive     CEFTRIAXONE <=0.25 SENSITIVE Sensitive     CIPROFLOXACIN <=0.25 SENSITIVE Sensitive     GENTAMICIN <=1 SENSITIVE Sensitive     IMIPENEM <=0.25 SENSITIVE Sensitive     NITROFURANTOIN 64 INTERMEDIATE Intermediate     TRIMETH/SULFA >=320 RESISTANT Resistant     AMPICILLIN/SULBACTAM 8 SENSITIVE Sensitive     PIP/TAZO <=4 SENSITIVE Sensitive     * 20,000 COLONIES/mL KLEBSIELLA PNEUMONIAE  Blood culture (routine x 2)     Status: None (Preliminary result)   Collection Time: 08/08/22  5:58 PM   Specimen: BLOOD RIGHT HAND  Result Value Ref Range Status   Specimen Description BLOOD RIGHT HAND BOTTLES DRAWN AEROBIC ONLY  Final   Special Requests   Final    Blood Culture results may not be optimal due to an inadequate volume of blood received in culture bottles   Culture   Final    NO GROWTH 3 DAYS Performed at [facility], [address], [place],  [address]    Report Status PENDING  Incomplete         Radiology Studies: DG CHEST PORT 1 VIEW  Result Date: 08/10/2022 CLINICAL DATA:  Dyspnea EXAM: PORTABLE CHEST 1 VIEW COMPARISON:  Radiographs 08/08/2022 FINDINGS: New retrocardiac atelectasis/consolidation. Remainder unchanged. Interstitial coarsening. Stable cardiomediastinal silhouette. Aortic calcification. No definite pleural effusion or pneumothorax. No acute osseous abnormality. IMPRESSION: Retrocardiac atelectasis/consolidation, new from prior. Pneumonia is difficult to exclude. Electronically Signed   By: Placido Sou M.D.   On: 08/10/2022 03:02        Scheduled Meds:  atorvastatin  40 mg Oral Daily   escitalopram  20 mg Oral Daily   finasteride  5 mg Oral Daily   pantoprazole (PROTONIX) IV  40 mg Intravenous Q12H   potassium chloride  40 mEq Oral BID   pregabalin  75 mg Oral BID   topiramate  25 mg Oral BID     LOS: 0 days    Time spent: 35 minutes    Lukasz Rogus Darleen Crocker, DO [facility]  If 7PM-7AM, please contact night-coverage [email or web address] 08/11/2022, 10:04 AM

## 2022-08-11 NOTE — Plan of Care (Signed)
  Problem: Acute Rehab PT Goals(only PT should resolve) Goal: Pt Will Go Supine/Side To Sit Outcome: Progressing Flowsheets (Taken 08/11/2022 1158) Pt will go Supine/Side to Sit: with moderate assist Goal: Patient Will Transfer Sit To/From Stand Outcome: Progressing Flowsheets (Taken 08/11/2022 1158) Patient will transfer sit to/from stand: with moderate assist Goal: Pt Will Transfer Bed To Chair/Chair To Bed Outcome: Progressing Flowsheets (Taken 08/11/2022 1158) Pt will Transfer Bed to Chair/Chair to Bed: with mod assist  Problem: Acute Rehab PT Goals(only PT should resolve) Goal: Patient Will Perform Sitting Balance Outcome: Progressing Flowsheets (Taken 08/11/2022 1158) Patient will perform sitting balance:  with modified independence  with supervision   11:59 AM, 08/11/22 Lonell Grandchild, MPT Physical Therapist with [facility] [phone] office [phone] mobile phone

## 2022-08-11 NOTE — Progress Notes (Signed)
Gastroenterology Progress Note    Primary Care Physician:  [name], MD Primary Gastroenterologist:  Dr. Jenetta Downer   Patient ID: Jared Bridges; [id number]; 12-Feb-1930    Subjective   No overt GI bleeding. No abdominal pain. Sitting up in chair. Tolerating diet. No shortness of breath. No supplemental oxygen. He is on room air.    Objective   Vital signs in last 24 hours Temp:  [98.3 F (36.8 C)-98.6 F (37 C)] 98.3 F (36.8 C) (09/11 0548) Pulse Rate:  [56-58] 56 (09/11 0548) Resp:  [20] 20 (09/11 0548) BP: (109-111)/(43-50) 109/50 (09/11 0548) SpO2:  [100 %] 100 % (09/11 0548) Last BM Date : 08/09/22  Physical Exam General:   Alert and oriented, pleasant Lungs: Clear to auscultation bilaterally, without wheezing, rales, or rhonchi.  Abdomen:  Bowel sounds present, soft, non-tender, non-distended. Limited exam as sitting in chair  Msk:  Symmetrical without gross deformities. Normal posture. Neurologic:  Alert and  oriented x4    Intake/Output from previous day: 09/10 0701 - 09/11 0700 In: 480 [P.O.:480] Out: 3200 [Urine:3200] Intake/Output this shift: No intake/output data recorded.  Lab Results  Recent Labs    08/09/22 0416 08/10/22 0512 08/11/22 0454  WBC 8.2 13.5* 7.0  HGB 8.0* 7.8* 7.7*  HCT 27.2* 27.1* 25.7*  PLT 241 249 231   BMET Recent Labs    08/09/22 0416 08/10/22 0512 08/11/22 0454  NA 142 141 140  K 3.9 3.4* 3.2*  CL 110 109 108  CO2 '27 27 28  '$ GLUCOSE 96 114* 98  BUN '12 11 8  '$ CREATININE 0.74 0.72 0.76  CALCIUM 9.1 8.7* 8.7*   LFT Recent Labs    08/08/22 1532  PROT 6.5  ALBUMIN 3.0*  AST 10*  ALT 8  ALKPHOS 53  BILITOT 0.6     Studies/Results DG CHEST PORT 1 VIEW  Result Date: 08/10/2022 CLINICAL DATA:  Dyspnea EXAM: PORTABLE CHEST 1 VIEW COMPARISON:  Radiographs 08/08/2022 FINDINGS: New retrocardiac atelectasis/consolidation. Remainder unchanged. Interstitial coarsening. Stable cardiomediastinal silhouette.  Aortic calcification. No definite pleural effusion or pneumothorax. No acute osseous abnormality. IMPRESSION: Retrocardiac atelectasis/consolidation, new from prior. Pneumonia is difficult to exclude. Electronically Signed   By: Placido Sou M.D.   On: 08/10/2022 03:02   DG Chest 2 View  Result Date: 08/08/2022 CLINICAL DATA:  Shortness of breath with fatigue and generalized weakness. EXAM: CHEST - 2 VIEW COMPARISON:  07/13/2022 FINDINGS: Lungs are adequately inflated without focal airspace consolidation. Possible layering small effusion on the lateral film likely left-sided. Cardiomediastinal silhouette and remainder of the exam is unchanged. IMPRESSION: Possible posterior layering small left pleural effusion. Electronically Signed   By: Marin Olp M.D.   On: 08/08/2022 16:26   DG Chest Portable 1 View  Result Date: 07/13/2022 CLINICAL DATA:  Productive cough and weakness.  Recent sepsis. EXAM: PORTABLE CHEST 1 VIEW COMPARISON:  06/23/2022 FINDINGS: The heart size and mediastinal contours are within normal limits. Aortic atherosclerotic calcification incidentally noted. Both lungs are clear. The visualized skeletal structures are unremarkable. IMPRESSION: No active disease. Electronically Signed   By: Marlaine Hind M.D.   On: 07/13/2022 13:50    Assessment  88 y.o. male with a history of depression, diverticulitis, history of gastric ulcers, PMR, stroke, TIA, diabetes, who came to the hospital after presenting worsening fatigue and weakness. He was found to have worsening anemia with Hgb  7.1 and heme positive stool. He received 1 unit PRBCs. There was concern for melena, and he underwent EGD  that was overall unrevealing. Colonoscopy had been planned but on hold due to possible pneumonia vs atelectasis.   Clinically, he is improved today and not on any supplemental oxygen. Does not appear to have pneumonia. He is appropriate to prep for colonoscopy today. I also discussed this with his daughter,  Jared Bridges. She is also requesting a palliative consultation.    Plan / Recommendations  Colonoscopy prep today Colonoscopy by Dr. Jenetta Downer on 9/12 Palliative consultation at request of daughter Clear liquids NPO after midnight    LOS: 1 day    08/11/2022, 8:26 AM  Annitta Needs, PhD, ANP-BC [facility]

## 2022-08-11 NOTE — TOC Initial Note (Addendum)
Transition of Care [facility]) - Initial/Assessment Note    Patient Details  Name: Jared Bridges MRN: [id number] Date of Birth: Jul 04, 1930  Transition of Care [facility]) CM/SW Contact:    Iona Beard, [place] Phone Number: 08/11/2022, 9:56 AM  Clinical Narrative:                 TOC updated that PT is recommending SNF for pt at D/C. CSW spoke with pts daughter [name] about recommendation. They are agreeable to SNF referral and would like referral sent to [facility]. CSW completed Fl2 and will send referral for review. TOC to start insurance auth once pt closer to D/C date. TOC to follow.   Expected Discharge Plan: Skilled Nursing Facility Barriers to Discharge: Continued Medical Work up   Patient Goals and CMS Choice Patient states their goals for this hospitalization and ongoing recovery are:: go to SNF CMS Medicare.gov Compare Post Acute Care list provided to:: Patient Represenative (must comment) Choice offered to / list presented to : Adult Children  Expected Discharge Plan and Services Expected Discharge Plan: [place] In-house Referral: Clinical Social Work Discharge Planning Services: CM Consult Post Acute Care Choice: [place] arrangements for the past 2 months: [place]                                      Prior Living Arrangements/Services Living arrangements for the past 2 months: Single Family Home Lives with:: Adult Children Patient language and need for interpreter reviewed:: Yes Do you feel safe going back to the place where you live?: Yes      Need for Family Participation in Patient Care: Yes (Comment) Care giver support system in place?: Yes (comment) Current home services: DME Criminal Activity/Legal Involvement Pertinent to Current Situation/Hospitalization: No - Comment as needed  Activities of Daily Living Home Assistive Devices/Equipment: Transport planner, [employer] fast streamer, Other (Comment) (sara-steady) ADL  Screening (condition at time of admission) Patient's cognitive ability adequate to safely complete daily activities?: No Is the patient deaf or have difficulty hearing?: No Does the patient have difficulty seeing, even when wearing glasses/contacts?: No Does the patient have difficulty concentrating, remembering, or making decisions?: Yes Patient able to express need for assistance with ADLs?: Yes Does the patient have difficulty dressing or bathing?: Yes Independently performs ADLs?: No Communication: Dependent Is this a change from baseline?: Pre-admission baseline Dressing (OT): Dependent Is this a change from baseline?: Pre-admission baseline Grooming: Dependent Is this a change from baseline?: Pre-admission baseline Feeding: Dependent Is this a change from baseline?: Pre-admission baseline Bathing: Dependent Is this a change from baseline?: Pre-admission baseline Toileting: Dependent Is this a change from baseline?: Pre-admission baseline In/Out Bed: Dependent Is this a change from baseline?: Pre-admission baseline Walks in Home: Dependent (does not walk) Is this a change from baseline?: Pre-admission baseline Does the patient have difficulty walking or climbing stairs?: Yes Weakness of Legs: Both Weakness of Arms/Hands: Both  Permission Sought/Granted                  Emotional Assessment Appearance:: Appears stated age Attitude/Demeanor/Rapport: Engaged Affect (typically observed): Accepting Orientation: : Oriented to Self, Oriented to Place, Oriented to  Time, Oriented to Situation Alcohol / Substance Use: Not Applicable Psych Involvement: No (comment)  Admission diagnosis:  Iron deficiency anemia secondary to blood loss (chronic) [D50.0] Upper GI bleed [K92.2] Symptomatic anemia [D64.9] Acute blood loss anemia [  D62] Patient Active Problem List   Diagnosis Date Noted   Acute respiratory distress 08/10/2022   Atelectasis 08/10/2022   Iron deficiency anemia  secondary to blood loss (chronic) 08/08/2022   Symptomatic anemia 07/01/2022   Grade I diastolic dysfunction 76/28/3151   Hyperlipidemia associated with type 2 diabetes mellitus ([place]) 06/26/2022   Hypertension associated with type 2 diabetes mellitus ([place]) 06/26/2022   History of CVA (cerebrovascular accident) 06/26/2022   Vascular dementia without behavioral disturbance ([name] 06/26/2022   Neurodegenerative cognitive impairment ([place]) 06/26/2022   Normochromic normocytic anemia    Septic shock ([name] 06/16/2022   Lactic acidosis 06/16/2022   Mixed hyperlipidemia 06/16/2022   Chronic diastolic CHF (congestive heart failure) ([place]) 06/16/2022   GERD (gastroesophageal reflux disease) 06/16/2022   Sepsis due to undetermined organism ([place])    Loss of weight 03/10/2022   History of peptic ulcer disease 09/03/2021   Wheelchair dependence 09/02/2021   Quadriplegia () 09/02/2021   Sleep disturbance    Acute blood loss anemia    Controlled type 2 diabetes mellitus with hyperglycemia, without long-term current use of insulin ([name]    Central cord syndrome at C7 level of cervical spinal cord ([name] 07/05/2021   Epidural hematoma ([name] 07/02/2021   Lower urinary tract symptoms (LUTS) 10/31/2020   Prostate cancer ([place]) 06/20/2020   Elevated PSA 04/18/2020   Cobalamin deficiency 02/22/2020   Intractable chronic migraine without aura 02/20/2020   Altered mental status 02/20/2020   Asthenia 02/20/2020   Diabetic peripheral neuropathy ([place]) 02/20/2020   Obstructive sleep apnea of adult 02/20/2020   Medicare annual wellness visit, subsequent 12/07/2019   Depressed mood 12/07/2019   Vitamin D deficiency 10/25/2019   Memory loss 10/25/2019   Fall 10/25/2019   Nonrheumatic aortic valve stenosis 03/23/2018   Migraine headaches 12/29/2017   TIA (transient ischemic attack) 12/28/2017   Rectal bleeding 11/26/2017   Carpal tunnel syndrome, left 07/22/2017   Acquired trigger finger 07/22/2017   Acute  cholecystitis 04/05/2016   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   Total knee replacement status 02/28/2016   CVA (cerebral infarction) 12/19/2015   Carotid stenosis 12/19/2015   Postoperative wound dehiscence 07/09/2015   Cellulitis of knee, left 06/29/2015   Wound infection after surgery 06/29/2015   OA (osteoarthritis) of knee 05/28/2015   BRBPR (bright red blood per rectum) 11/21/2014   Orthostatic hypotension 11/21/2014   Dizziness 11/16/2014   Polymyalgia rheumatica ([place]) 09/12/2014   Urinary tract infection with hematuria 08/16/2014   Encephalopathy 08/16/2014   Leukocytosis 08/16/2014   Iron deficiency anemia due to chronic blood loss 08/16/2014   DJD (degenerative joint disease) 08/16/2014   Essential hypertension    Cataract, nuclear 04/11/2014   Complaining of back-related symptom 03/15/2014   Arthralgia of hip or thigh 03/15/2014   Gonalgia 10/10/2013   Left knee pain 10/10/2013   1st degree AV block 08/04/2013   Infection of the upper respiratory tract 04/05/2013   Cataract 02/24/2013   Pseudoaphakia 02/24/2013   Episode of syncope 01/11/2013   Cardiac murmur 11/09/2012   Near syncope 11/09/2012   Anxiety 06/18/2012   Depression, recurrent ([place]) 06/18/2012   Upper GI bleed 06/18/2012   Diverticulitis 06/18/2012   Difficulty hearing 06/18/2012   Adaptive colitis 06/18/2012   Arthritis, degenerative 06/18/2012   Hearing impairment 06/18/2012   Multinodular goiter 01/29/2012   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 [place] [address] Phone:  [phone] Fax: [phone]  [place], [place] [address] Phone: [phone] Fax: [phone]     Social Determinants of Health (SDOH) Interventions    Readmission Risk Interventions    06/23/2022    1:26 PM  Readmission Risk Prevention Plan  Transportation Screening Complete  HRI or [place] Complete  Social Work Consult for [place] Planning/Counseling Complete  Palliative Care Screening Not Applicable  Medication Review Press photographer) Complete

## 2022-08-11 NOTE — Evaluation (Signed)
Physical Therapy Evaluation Patient Details Name: Jared Bridges MRN: [id number] DOB: 1930/07/13 Today's Date: 08/11/2022  History of Present Illness  RODERT HINCH is a 88 y.o. male with medical history significant of polymyalgia rheumatica, stroke, diabetes, dementia, hypertension, weakness, fatigue.  The patient's daughter is his primary caregiver.  They have been trying to get him in to skilled nursing facility.  He does have some home health care.  Patient has been having low blood pressure today and has been slightly more confused with difficulty with increasing weakness.  He denies any significant pain, shortness of breath, fevers, chills.  It appears that he has been having black tar-like stools for the past several weeks.  His hemoglobin has been dropping over the past couple of months without any investigation.   Clinical Impression  Patient demonstrates slow labored movement for sitting up at bedside with c/o neck pain with tactile cueing behind neck, had to cue patient at shoulders to avoid aggravating neck, very unsteady on feet and unable to maintain standing balance due to weakness and required Max/total assist stand pivot with left knee blocked to transfer to chair.  Patient tolerated sitting up in chair after therapy - nursing staff aware.  Patient will benefit from continued skilled physical therapy in hospital and recommended venue below to increase strength, balance, endurance for safe ADLs and gait.          Recommendations for follow up therapy are one component of a multi-disciplinary discharge planning process, led by the attending physician.  Recommendations may be updated based on patient status, additional functional criteria and insurance authorization.  Follow Up Recommendations Skilled nursing-short term rehab (<3 hours/day) Can patient physically be transported by private vehicle: No    Assistance Recommended at Discharge Intermittent Supervision/Assistance   Patient can return home with the following  A lot of help with walking and/or transfers;A lot of help with bathing/dressing/bathroom;Assistance with cooking/housework;Help with stairs or ramp for entrance    Equipment Recommendations None recommended by PT  Recommendations for Other Services       Functional Status Assessment Patient has had a recent decline in their functional status and demonstrates the ability to make significant improvements in function in a reasonable and predictable amount of time.     Precautions / Restrictions Precautions Precautions: Fall Restrictions Weight Bearing Restrictions: No      Mobility  Bed Mobility Overal bed mobility: Needs Assistance Bed Mobility: Supine to Sit     Supine to sit: Max assist     General bed mobility comments: increased time, labored movement    Transfers Overall transfer level: Needs assistance Equipment used: Rolling walker (2 wheels), 1 person hand held assist Transfers: Sit to/from Stand, Bed to chair/wheelchair/BSC Sit to Stand: Max assist Stand pivot transfers: Max assist, Total assist         General transfer comment: unable to maintain standing balance due to BLE weakness    Ambulation/Gait                  Stairs            Wheelchair Mobility    Modified Rankin (Stroke Patients Only)       Balance Overall balance assessment: Needs assistance Sitting-balance support: Feet supported, No upper extremity supported Sitting balance-Leahy Scale: Fair Sitting balance - Comments: seated at EOB   Standing balance support: During functional activity, Reliant on assistive device for balance, Bilateral upper extremity supported Standing balance-Leahy Scale: Poor Standing balance comment: using  RW or hand held assist                             Pertinent Vitals/Pain Pain Assessment Pain Assessment: Faces Faces Pain Scale: Hurts little more Pain Location: neck Pain  Descriptors / Indicators: Sore, Grimacing, Guarding Pain Intervention(s): Limited activity within patient's tolerance, Monitored during session, Repositioned    Home Living Family/patient expects to be discharged to:: Private residence Living Arrangements: Children;Spouse/significant other Available Help at Discharge: Family;Available 24 hours/day Type of Home: House Home Access: Stairs to enter;Ramped entrance Entrance Stairs-Rails: Can reach both Entrance Stairs-Number of Steps: 5 Alternate Level Stairs-Number of Steps: 17 Home Layout: Two level;Bed/bath upstairs;Able to live on main level with bedroom/bathroom;Full bath on main level Home Equipment: Rollator (4 wheels);Rolling Walker (2 wheels);Shower seat      Prior Function Prior Level of Function : Needs assist       Physical Assist : Mobility (physical);ADLs (physical) Mobility (physical): Bed mobility;Transfers;Gait   Mobility Comments: Assisted transfers to power w/c, non-ambulatory ADLs Comments: assisted by family     Hand Dominance   Dominant Hand: Right    Extremity/Trunk Assessment   Upper Extremity Assessment Upper Extremity Assessment: Generalized weakness    Lower Extremity Assessment Lower Extremity Assessment: Generalized weakness    Cervical / Trunk Assessment Cervical / Trunk Assessment: Kyphotic  Communication   Communication: HOH  Cognition Arousal/Alertness: Awake/alert Behavior During Therapy: WFL for tasks assessed/performed, Anxious Overall Cognitive Status: No family/caregiver present to determine baseline cognitive functioning                                          General Comments      Exercises     Assessment/Plan    PT Assessment Patient needs continued PT services  PT Problem List Decreased strength;Decreased activity tolerance;Decreased balance;Decreased mobility       PT Treatment Interventions DME instruction;Functional mobility training;Therapeutic  activities;Therapeutic exercise;Patient/family education;Wheelchair mobility training;Balance training    PT Goals (Current goals can be found in the Care Plan section)  Acute Rehab PT Goals Patient Stated Goal: return home with family to assist PT Goal Formulation: With patient Time For Goal Achievement: 08/25/22 Potential to Achieve Goals: Fair    Frequency Min 3X/week     Co-evaluation               AM-PAC PT "6 Clicks" Mobility  Outcome Measure Help needed turning from your back to your side while in a flat bed without using bedrails?: A Lot Help needed moving from lying on your back to sitting on the side of a flat bed without using bedrails?: A Lot Help needed moving to and from a bed to a chair (including a wheelchair)?: A Lot Help needed standing up from a chair using your arms (e.g., wheelchair or bedside chair)?: A Lot Help needed to walk in hospital room?: Total Help needed climbing 3-5 steps with a railing? : Total 6 Click Score: 10    End of Session   Activity Tolerance: Patient tolerated treatment well;Patient limited by fatigue Patient left: in chair;with call bell/phone within reach Nurse Communication: Mobility status PT Visit Diagnosis: Unsteadiness on feet (R26.81);Other abnormalities of gait and mobility (R26.89);Muscle weakness (generalized) (M62.81)    Time: 2119-4174 PT Time Calculation (min) (ACUTE ONLY): 30 min   Charges:   PT Evaluation $PT Eval  Moderate Complexity: 1 Mod PT Treatments $Therapeutic Activity: 23-37 mins        11:57 AM, 08/11/22 Lonell Grandchild, MPT Physical Therapist with [facility] [phone] office [phone] mobile phone

## 2022-08-12 ENCOUNTER — Observation Stay (HOSPITAL_COMMUNITY): Payer: Medicare Other | Admitting: Anesthesiology

## 2022-08-12 ENCOUNTER — Encounter (HOSPITAL_COMMUNITY)
Admission: EM | Disposition: A | Discharge status: Home Health | Payer: Self-pay | Source: Home / Self Care | Attending: Internal Medicine

## 2022-08-12 ENCOUNTER — Encounter (HOSPITAL_COMMUNITY): Payer: Self-pay | Admitting: Internal Medicine

## 2022-08-12 DIAGNOSIS — Z7189 Other specified counseling: Secondary | ICD-10-CM

## 2022-08-12 DIAGNOSIS — D500 Iron deficiency anemia secondary to blood loss (chronic): Secondary | ICD-10-CM | POA: Diagnosis not present

## 2022-08-12 DIAGNOSIS — Z515 Encounter for palliative care: Secondary | ICD-10-CM

## 2022-08-12 DIAGNOSIS — I509 Heart failure, unspecified: Secondary | ICD-10-CM

## 2022-08-12 DIAGNOSIS — K635 Polyp of colon: Secondary | ICD-10-CM | POA: Diagnosis not present

## 2022-08-12 DIAGNOSIS — D649 Anemia, unspecified: Secondary | ICD-10-CM

## 2022-08-12 DIAGNOSIS — Z980 Intestinal bypass and anastomosis status: Secondary | ICD-10-CM

## 2022-08-12 DIAGNOSIS — I110 Hypertensive heart disease with heart failure: Secondary | ICD-10-CM

## 2022-08-12 HISTORY — PX: COLONOSCOPY WITH PROPOFOL: SHX5780

## 2022-08-12 HISTORY — PX: POLYPECTOMY: SHX5525

## 2022-08-12 LAB — BASIC METABOLIC PANEL
Anion gap: 4 — ABNORMAL LOW (ref 5–15)
BUN: 7 mg/dL — ABNORMAL LOW (ref 8–23)
CO2: 29 mmol/L (ref 22–32)
Calcium: 9.2 mg/dL (ref 8.9–10.3)
Chloride: 109 mmol/L (ref 98–111)
Creatinine, Ser: 0.85 mg/dL (ref 0.61–1.24)
GFR, Estimated: >60 mL/min (ref >60)
Glucose, Bld: 91 mg/dL (ref 70–99)
Potassium: 4.1 mmol/L (ref 3.5–5.1)
Sodium: 142 mmol/L (ref 135–145)

## 2022-08-12 LAB — CBC
HCT: 28.8 % — ABNORMAL LOW (ref 39.0–52.0)
Hemoglobin: 8.4 g/dL — ABNORMAL LOW (ref 13.0–17.0)
MCH: 27.9 pg (ref 26.0–34.0)
MCHC: 29.2 g/dL — ABNORMAL LOW (ref 30.0–36.0)
MCV: 95.7 fL (ref 80.0–100.0)
Platelets: 234 10*3/uL (ref 150–400)
RBC: 3.01 MIL/uL — ABNORMAL LOW (ref 4.22–5.81)
RDW: 14.6 % (ref 11.5–15.5)
WBC: 6.7 10*3/uL (ref 4.0–10.5)
nRBC: 0 % (ref 0.0–0.2)

## 2022-08-12 LAB — MAGNESIUM: Magnesium: 1.8 mg/dL (ref 1.7–2.4)

## 2022-08-12 SURGERY — COLONOSCOPY WITH PROPOFOL
Anesthesia: General

## 2022-08-12 MED ORDER — SODIUM CHLORIDE 0.9 % IV SOLN: INTRAVENOUS | Status: DC

## 2022-08-12 MED ORDER — STERILE WATER FOR IRRIGATION IR SOLN
Status: DC | PRN
  Administered 2022-08-12: .6 mL

## 2022-08-12 MED ORDER — PROPOFOL 500 MG/50ML IV EMUL
INTRAVENOUS | Status: DC | PRN
  Administered 2022-08-12: 150 ug/kg/min via INTRAVENOUS

## 2022-08-12 MED ORDER — EPHEDRINE SULFATE (PRESSORS) 50 MG/ML IJ SOLN
INTRAMUSCULAR | Status: DC | PRN
  Administered 2022-08-12: 10 mg via INTRAVENOUS

## 2022-08-12 MED ORDER — PHENYLEPHRINE HCL (PRESSORS) 10 MG/ML IV SOLN
INTRAVENOUS | Status: DC | PRN
  Administered 2022-08-12 (×2): 160 ug via INTRAVENOUS

## 2022-08-12 MED ORDER — LACTATED RINGERS IV SOLN: INTRAVENOUS | Status: DC

## 2022-08-12 NOTE — Consult Note (Signed)
Consultation Note Date: 08/12/2022   Patient Name: Jared Bridges  DOB: February 18, 1930  MRN: [id number]  Age / Sex: 88 y.o., male  PCP: [name], MD Referring Physician: Rodena Goldmann, DO  Reason for Consultation: Establishing goals of care  HPI/Patient Profile: 88 y.o. male  with past medical history of polymyalgia rheumatica, stroke, DM, HTN, iron deficiency anemia, left knee repair irrigation and debridement, 2016/17, per daughter recent ankle fracture and sepsis admitted on 08/08/2022 with iron deficiency anemia secondary to blood loss.  Upper scope completed, lower scope scheduled today.  Jared Bridges was alert and oriented x3.  Although he is listed as having a diagnosis of dementia, daughter denies this to be correct.  Clinical Assessment and Goals of Care: I have reviewed medical records including EPIC notes, labs and imaging, received report from RN, assessed the patient.  Jared Bridges is lying quietly in bed.  He greets me, making and mostly keeping eye contact.  He is alert and oriented x3, able to make his basic needs known.  After a few minutes conversation, his wife Joycelyn Schmid and daughter Jared Bridges arrive.   We meet at the bedside to discuss diagnosis prognosis, GOC, EOL wishes, disposition and options.  I introduced Palliative Medicine as specialized medical care for people living with serious illness. It focuses on providing relief from the symptoms and stress of a serious illness. The goal is to improve quality of life for both the patient and the family.  We discussed a brief life review of the patient.  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  He is active with the [place].  He has 1 child, daughter Jared Bridges.  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  Jared Bridges has experienced limited mobility, but until recently was able to stand with  assistance.  He is dependent for ADLs.  We then focused on their current illness.  We talk about Mr. Nephew acute illness including, but not limited to, anemia, blood transfusion and selected labs, testing scheduled for today, at home services.  I share a diagram of the chronic illness pathway, what is normal and expected.  The natural disease trajectory and expectations at EOL were discussed.  Advanced directives, concepts specific to code status, artifical feeding and hydration, and rehospitalization were considered and discussed.  We talked about the concept of "treat the treatable, but allowing natural passing".  At this point Mr. Denne remains full scope/full code.  Hospice and Palliative Care services outpatient were explained and offered.  We talk about the benefits of outpatient palliative services and the benefits of outpatient "treat the treatable" hospice care.  Family is considering choices.  She tells me that they were scheduled for outpatient palliative services through the [place], but this would not be until the end of November.  We talked about relationship building with service providers.  Discussed the importance of continued conversation with family and the medical providers regarding overall plan of  care and treatment options, ensuring decisions are within the context of the patient's values and GOCs.  Questions and concerns were addressed.  The family was encouraged to call with questions or concerns.  PMT will continue to support holistically.  Conference with attending, bedside nursing staff, transition care team related to patient condition, needs, goals of care, disposition.   HCPOA HCPOA -Jared Bridges names his daughter, only child, [name] is his healthcare surrogate.  She tells me that she has HCPOA paperwork.  Jared Bridges is experiencing mild memory loss.    SUMMARY OF RECOMMENDATIONS   At this point full scope/full code Time for outcomes Short-term  rehab Outpatient palliative services, considering provider of choice   Code Status/Advance Care Planning: Full code  Symptom Management:  Per hospitalist, no additional needs at this time.  Palliative Prophylaxis:  Oral Care, Palliative Wound Care, and Turn Reposition  Additional Recommendations (Limitations, Scope, Preferences): Full Scope Treatment  Psycho-social/Spiritual:  Desire for further Chaplaincy support:no Additional Recommendations: Caregiving  Support/Resources  Prognosis:  Unable to determine, based on outcomes.  Guarded at this point.  Discharge Planning: Anticipate DC to short-term rehab with outpatient palliative      Primary Diagnoses: Present on Admission:  Essential hypertension  Hypercholesterolemia  Benign prostatic hyperplasia with urinary obstruction  Essential (primary) hypertension  Prostate cancer ([name]  Diabetic peripheral neuropathy ([place])  Controlled type 2 diabetes mellitus with hyperglycemia, without long-term current use of insulin ([name]  Obstructive sleep apnea of adult  Iron deficiency anemia secondary to blood loss (chronic)  Acute blood loss anemia   I have reviewed the medical record, interviewed the patient and family, and examined the patient. The following aspects are pertinent.  Past Medical History:  Diagnosis Date   Anemia, iron deficiency    Chronic headaches    Migraines   Chronic kidney disease    Resolved   Depression    Diverticulitis    Essential hypertension    GI bleed    Dr. Laural Bridges - 1998   Hypercholesteremia    Orthostatic hypotension    Peptic ulcer disease    Peripheral neuropathy    Polymyalgia rheumatica ([name]    Stroke ([name]    Residual trouble reading and writing   Syncope    Thyroid nodule    TIA (transient ischemic attack) 12/2017   Type 2 diabetes mellitus ([place])    Social History   Socioeconomic History   Marital status: Married    Spouse name: Not on file   Number of children: Not on  file   Years of education: Not on file   Highest education level: Not on file  Occupational History   Not on file  Tobacco Use   Smoking status: Former    Packs/day: 0.10    Years: 18.00    Total pack years: 1.80    Types: Cigarettes    Start date: 12/18/1958    Quit date: 12/01/1986    Years since quitting: 35.7   Smokeless tobacco: Never  Vaping Use   Vaping Use: Never used  Substance and Sexual Activity   Alcohol use: No    Alcohol/week: 0.0 standard drinks of alcohol   Drug use: No   Sexual activity: Never  Other Topics Concern   Not on file  Social History Narrative   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   Social Determinants of Health   Financial Resource Strain: Not on file  Food Insecurity: No Food Insecurity (08/08/2022)   Hunger Vital  Sign    Worried About Charity fundraiser in the Last Year: Never true    [place] in the Last Year: Never true  Transportation Needs: No Transportation Needs (08/08/2022)   PRAPARE - Hydrologist (Medical): No    Lack of Transportation (Non-Medical): No  Physical Activity: Not on file  Stress: Not on file  Social Connections: Not on file   Family History  Problem Relation Age of Onset   Colon cancer Sister    Hypertension Other    Depression Brother    Depression Daughter    Scheduled Meds:  atorvastatin  40 mg Oral Daily   escitalopram  20 mg Oral Daily   finasteride  5 mg Oral Daily   pantoprazole (PROTONIX) IV  40 mg Intravenous Q12H   pregabalin  75 mg Oral BID   topiramate  25 mg Oral BID   Continuous Infusions: PRN Meds:.acetaminophen, ondansetron **OR** ondansetron (ZOFRAN) IV, zolpidem Medications Prior to Admission:  Prior to Admission medications   Medication Sig Start Date End Date Taking? Authorizing Provider  Apremilast (OTEZLA) 30 MG TABS Take 30 mg by mouth 2 (two) times daily.   Yes [provider]  atorvastatin (LIPITOR) 40 MG tablet Take 1 tablet  (40 mg total) by mouth daily. 07/09/22  Yes Gerlene Fee, NP  Calcium 200 MG TABS Take 400 mg by mouth daily.   Yes [provider]  Cholecalciferol (VITAMIN D3) 250 MCG (10000 UT) TABS Take 1,000 Units by mouth daily.   Yes [provider]  escitalopram (LEXAPRO) 20 MG tablet Take 1 tablet (20 mg total) by mouth daily. 07/09/22  Yes Gerlene Fee, NP  ferrous sulfate 325 (65 FE) MG tablet Take 1 tablet (325 mg total) by mouth 3 (three) times a week. [place], wed, fri 07/09/22  Yes Gerlene Fee, NP  finasteride (PROSCAR) 5 MG tablet Take 1 tablet (5 mg total) by mouth daily. 07/09/22  Yes Gerlene Fee, NP  furosemide (LASIX) 20 MG tablet Take 1 tablet (20 mg total) by mouth every other day. Patient taking differently: Take 20 mg by mouth every other day. Tuesday, Thursday and Saturday 07/09/22 08/08/22 Yes Gerlene Fee, NP  omeprazole (PRILOSEC) 40 MG capsule Take 1 capsule (40 mg total) by mouth daily. 07/09/22  Yes Gerlene Fee, NP  pregabalin (LYRICA) 75 MG capsule Take 1 capsule (75 mg total) by mouth 2 (two) times daily. 07/09/22  Yes Gerlene Fee, NP  topiramate (TOPAMAX) 50 MG tablet Take 0.5 tablets (25 mg total) by mouth 2 (two) times daily. 07/09/22  Yes Gerlene Fee, NP  vitamin B-12 (CYANOCOBALAMIN) 500 MCG tablet Take 1,000 mcg by mouth daily.   Yes [provider]  zolpidem (AMBIEN) 5 MG tablet Take 5-10 mg by mouth at bedtime as needed for sleep. 07/09/22  Yes [provider]  amoxicillin-clavulanate (AUGMENTIN) 875-125 MG tablet Take 1 tablet by mouth every 12 (twelve) hours. Patient not taking: Reported on 08/08/2022 07/16/22   Hendricks Limes, PA-C   Allergies  Allergen Reactions   Iodine Hives, Itching and Other (See Comments)    very allergic(per daughter), can pre-med with benadryl   Calan [Verapamil] Other (See Comments)    Weakness   Cymbalta [Duloxetine Hcl] Other (See Comments)    Makes patient have jerking motions.    Iodinated Contrast Media Hives, Itching and Other (See Comments)    Can pre-med with benadryl  Latex Other (See Comments)    Redness iritation   Lisinopril Other (See Comments)    Makes patient have jerking motions.   Neurontin [Gabapentin] Other (See Comments)    Makes patient have jerking motions.   Zoloft [Sertraline Hcl] Diarrhea and Other (See Comments)    Makes patient have jerking motions.   Valium [Diazepam] Other (See Comments)    "Did not work"   Flomax [Tamsulosin Hcl] Other (See Comments)    Dizziness   Glimepiride Other (See Comments)    "Did not work"   Melatonin Nausea Only   Tape Other (See Comments)    Skin irritation   Review of Systems  Unable to perform ROS: Acuity of condition    Physical Exam Vitals and nursing note reviewed.  Constitutional:      General: He is not in acute distress.    Appearance: He is ill-appearing.  HENT:     Mouth/Throat:     Mouth: Mucous membranes are moist.  Cardiovascular:     Rate and Rhythm: Normal rate.  Pulmonary:     Effort: Pulmonary effort is normal. No respiratory distress.  Skin:    General: Skin is warm and dry.     Findings: Bruising present.  Neurological:     Mental Status: He is alert and oriented to person, place, and time.  Psychiatric:        Mood and Affect: Mood normal.        Behavior: Behavior normal.     Vital Signs: BP (!) 150/81   Pulse 61   Temp 97.6 F (36.4 C) (Oral)   Resp 20   Ht '6\' 2"'$  (1.88 m)   Wt 77.3 kg   SpO2 98%   BMI 21.88 kg/m  Pain Scale: 0-10   Pain Score: Asleep   SpO2: SpO2: 98 % O2 Device:SpO2: 98 % O2 Flow Rate: .O2 Flow Rate (L/min): 2 L/min  IO: Intake/output summary:  Intake/Output Summary (Last 24 hours) at 08/12/2022 1259 Last data filed at 08/12/2022 0700 Gross per 24 hour  Intake 720 ml  Output 1600 ml  Net -880 ml    LBM: Last BM Date : 08/12/22 Baseline Weight: Weight: 77.3 kg Most recent weight: Weight: 77.3 kg     Palliative  Assessment/Data:   Flowsheet Rows    Flowsheet Row Most Recent Value  Intake Tab   Referral Department Hospitalist  Unit at Time of Referral Cardiac/Telemetry Unit  Palliative Care Primary Diagnosis Other (Comment)  Date Notified 08/11/22  Palliative Care Type Return patient Palliative Care  Reason for referral Clarify Goals of Care  Date of Admission 08/08/22  Date first seen by Palliative Care 08/12/22  # of days Palliative referral response time 1 Day(s)  # of days IP prior to Palliative referral 3  Clinical Assessment   Palliative Performance Scale Score 30%  Pain Max last 24 hours Not able to report  Pain Min Last 24 hours Not able to report  Dyspnea Max Last 24 Hours Not able to report  Dyspnea Min Last 24 hours Not able to report  Psychosocial & Spiritual Assessment   Palliative Care Outcomes        Time In: 0845 Time Out: 1000 Time Total: 75 minutes  Greater than 50%  of this time was spent counseling and coordinating care related to the above assessment and plan.  Signed by: Drue Novel, NP   Please contact Palliative Medicine Team phone at [phone] for questions and concerns.  For individual  provider: See Shea Evans

## 2022-08-12 NOTE — Anesthesia Postprocedure Evaluation (Signed)
Anesthesia Post Note  Patient: Jared Bridges  Procedure(s) Performed: COLONOSCOPY WITH PROPOFOL POLYPECTOMY  Patient location during evaluation: PACU Anesthesia Type: General Level of consciousness: awake and alert and patient cooperative Pain management: pain level controlled Vital Signs Assessment: post-procedure vital signs reviewed and stable Respiratory status: spontaneous breathing, nonlabored ventilation and respiratory function stable Cardiovascular status: blood pressure returned to baseline and stable Postop Assessment: no apparent nausea or vomiting Anesthetic complications: no   No notable events documented.   Last Vitals:  Vitals:   08/12/22 1600 08/12/22 1622  BP: (!) 128/59 127/62  Pulse: 79 77  Resp: 18 16  Temp: 36.6 C 36.7 C  SpO2: 100% 98%    Last Pain:  Vitals:   08/12/22 1622  TempSrc: Oral  PainSc:                  Navayah Sok C Akelia Husted

## 2022-08-12 NOTE — Transfer of Care (Signed)
Immediate Anesthesia Transfer of Care Note  Patient: Jared Bridges  Procedure(s) Performed: COLONOSCOPY WITH PROPOFOL POLYPECTOMY  Patient Location: PACU  Anesthesia Type:General  Level of Consciousness: awake, alert  and patient cooperative  Airway & Oxygen Therapy: Patient Spontanous Breathing and Patient connected to nasal cannula oxygen  Post-op Assessment: Report given to RN, Post -op Vital signs reviewed and stable and Patient moving all extremities X 4  Post vital signs: Reviewed and stable  Last Vitals:  Vitals Value Taken Time  BP 128/59 08/12/22 1600  Temp    Pulse    Resp 18 08/12/22 1601  SpO2    Vitals shown include unvalidated device data.  Last Pain:  Vitals:   08/12/22 1530  TempSrc:   PainSc: 0-No pain         Complications: No notable events documented.

## 2022-08-12 NOTE — Brief Op Note (Signed)
08/08/2022 - 08/12/2022  4:04 PM  PATIENT:  Jared Bridges  88 y.o. male  PRE-OPERATIVE DIAGNOSIS:  anemia, heme positive stool  POST-OPERATIVE DIAGNOSIS:  Surgical anastamosis in sigmoid, cecal polyp , ascending colon polyp , transverse colon polyp , sigmoid colon polyp   PROCEDURE:  Procedure(s) with comments: COLONOSCOPY WITH PROPOFOL (N/A) - ASA 3 POLYPECTOMY  SURGEON:  Surgeon(s) and Role:    * Harvel Quale, MD - Primary  Patient underwent colonoscopy under propofol sedation.  Tolerated the procedure adequately.  The terminal ileum appeared normal up to 20 cm from IC valve.Four sessile polyps were found in the sigmoid colon, transverse colon, ascending colon and cecum.  The polyps were 3 to 4 mm in size.  These polyps were removed with a cold snare.  Resection and retrieval were complete.  There was evidence of a prior end-to-side colo-colonic anastomosis in the sigmoid colon.  This was patent and was characterized by healthy appearing mucosa.  The anastomosis was traversed. The retroflexed view of the distal rectum and anal verge was normal and showed no anal or rectal abnormalities.   RECOMMENDATIONS - Return patient to hospital ward for ongoing care.  - Full liquid diet today.  - Will discuss with family capsule endoscopy versus iron supplementation for now  [name], MD Gastroenterology and Hepatology [facility]

## 2022-08-12 NOTE — Anesthesia Preprocedure Evaluation (Signed)
Anesthesia Evaluation  Patient identified by MRN, date of birth, ID band Patient awake    Reviewed: Allergy & Precautions, NPO status , Patient's Chart, lab work & pertinent test results  Airway Mallampati: III  TM Distance: >3 FB Neck ROM: Full    Dental  (+) Dental Advisory Given, Missing   Pulmonary sleep apnea and Oxygen sleep apnea , former smoker,           Cardiovascular Exercise Tolerance: Poor hypertension, Pt. on medications +CHF  + dysrhythmias + Valvular Problems/Murmurs  Rhythm:Regular Rate:Normal + Systolic murmurs [id number] 17:13:44 Walker System-AP-ED ROUTINE RECORD [birth year] (18 yr) Male Caucasian Test [id number] Pain Vent. rate 65 BPM PR interval 214 ms QRS duration 92 ms QT/QTcB 458/476 ms P-R-T axes 62 -56 74 Sinus rhythm with 1st degree A-V block with Premature supraventricular complexes and with occasional Premature ventricular complexes Left anterior fascicular block Abnormal ECG When compared with ECG of 13-Jul-2022 13:03, PREVIOUS ECG IS PRESENT No sig changes Confirmed by Octaviano Glow [phone]) on 08/09/2022 11:00:13 AM   Neuro/Psych  Headaches, PSYCHIATRIC DISORDERS Anxiety Depression Dementia TIA Neuromuscular disease CVA    GI/Hepatic Neg liver ROS, PUD, GERD  Medicated and Controlled,  Endo/Other  diabetes, Well Controlled, Type 2  Renal/GU Renal disease  negative genitourinary   Musculoskeletal  (+) Arthritis , Osteoarthritis,    Abdominal   Peds negative pediatric ROS (+)  Hematology  (+) Blood dyscrasia, anemia ,   Anesthesia Other Findings   Reproductive/Obstetrics negative OB ROS                             Anesthesia Physical  Anesthesia Plan  ASA: 4  Anesthesia Plan: General   Post-op Pain Management: Minimal or no pain anticipated   Induction: Intravenous  PONV Risk Score and Plan: Propofol infusion  Airway Management  Planned: Nasal Cannula and Natural Airway  Additional Equipment:   Intra-op Plan:   Post-operative Plan: Possible Post-op intubation/ventilation  Informed Consent: I have reviewed the patients History and Physical, chart, labs and discussed the procedure including the risks, benefits and alternatives for the proposed anesthesia with the patient or authorized representative who has indicated his/her understanding and acceptance.     Dental advisory given  Plan Discussed with: Surgeon  Anesthesia Plan Comments:         Anesthesia Quick Evaluation

## 2022-08-12 NOTE — Progress Notes (Signed)
We will proceed with colonoscopy as scheduled.  I thoroughly discussed with the patient his procedure, including the risks involved. Patient understands what the procedure involves including the benefits and any risks. Patient understands alternatives to the proposed procedure. Risks including (but not limited to) bleeding, tearing of the lining (perforation), rupture of adjacent organs, problems with heart and lung function, infection, and medication reactions. A small percentage of complications may require surgery, hospitalization, repeat endoscopic procedure, and/or transfusion.  Patient understood and agreed.  Rajni Holsworth Castaneda, MD Gastroenterology and Hepatology [facility]  

## 2022-08-12 NOTE — Op Note (Signed)
[facility] Patient Name: Jared Bridges Procedure Date: 08/12/2022 3:14 PM MRN: [id number] Date of Birth: 11-24-1930 Attending MD: [name] ,  CSN: [id number] Age: 88 Admit Type: Inpatient Procedure:                Colonoscopy Indications:              Anemia Providers:                [name], Gwynneth Albright RN, RN,                            Raphael Gibney, Technician Referring MD:              Medicines:                Monitored Anesthesia Care Complications:            No immediate complications. Estimated Blood Loss:     Estimated blood loss: none. Procedure:                Pre-Anesthesia Assessment:                           - Prior to the procedure, a History and Physical                            was performed, and patient medications, allergies                            and sensitivities were reviewed. The patient's                            tolerance of previous anesthesia was reviewed.                           - The risks and benefits of the procedure and the                            sedation options and risks were discussed with the                            patient. All questions were answered and informed                            consent was obtained.                           - ASA Grade Assessment: III - A patient with severe                            systemic disease.                           After obtaining informed consent, the colonoscope                            was passed under direct vision. Throughout the  procedure, the patient's blood pressure, pulse, and                            oxygen saturations were monitored continuously. The                            [id number]-[id number] ([id number]) scope was introduced through                            the anus and advanced to the 20 cm into the ileum.                            The colonoscopy was performed without difficulty.                            The  patient tolerated the procedure well. Scope In: 3:34:42 PM Scope Out: 3:55:58 PM Scope Withdrawal Time: 0 hours 18 minutes 55 seconds  Total Procedure Duration: 0 hours 21 minutes 16 seconds  Findings:      The perianal and digital rectal examinations were normal.      The terminal ileum appeared normal up to 20 cm from IC valve.      Four sessile polyps were found in the sigmoid colon, transverse colon,       ascending colon and cecum. The polyps were 3 to 4 mm in size. These       polyps were removed with a cold snare. Resection and retrieval were       complete.      There was evidence of a prior end-to-side colo-colonic anastomosis in       the sigmoid colon. This was patent and was characterized by healthy       appearing mucosa. The anastomosis was traversed.      The retroflexed view of the distal rectum and anal verge was normal and       showed no anal or rectal abnormalities. Impression:               - The terminal ileum appeared normal up to 20 cm                            from IC valve.                           - Four 3 to 4 mm polyps in the sigmoid colon, in                            the transverse colon, in the ascending colon and in                            the cecum, removed with a cold snare. Resected and                            retrieved.                           - Patent end-to-side colo-colonic anastomosis,  characterized by healthy appearing mucosa.                           - The distal rectum and anal verge are normal on                            retroflexion view. Moderate Sedation:      Per Anesthesia Care Recommendation:           - Return patient to hospital ward for ongoing care.                           - Full liquid diet today.                           - Will discuss with family capsule endoscopy versus                            iron supplementation for now Procedure Code(s):        --- Professional ---                            [phone], Colonoscopy, flexible; with removal of                            tumor(s), polyp(s), or other lesion(s) by snare                            technique Diagnosis Code(s):        --- Professional ---                           K63.5, Polyp of colon                           Z98.0, Intestinal bypass and anastomosis status                           D64.9, Anemia, unspecified CPT copyright 2019 American Medical Association. All rights reserved. The codes documented in this report are preliminary and upon coder review may  be revised to meet current compliance requirements. [name], MD [name],  08/12/2022 4:06:46 PM This report has been signed electronically. Number of Addenda: 0

## 2022-08-12 NOTE — Progress Notes (Signed)
PROGRESS NOTE    Jared Bridges  HUT:[id number] DOB: 1930/07/04 DOA: 08/08/2022 PCP: [name], MD   Brief Narrative:    Jared Bridges is a 87 y.o. male with medical history significant of polymyalgia rheumatica, stroke, diabetes, dementia, hypertension, weakness, fatigue.  Patient was having some hypotension with confusion and weakness as well as melena.  He has been admitted with acute blood loss anemia in the setting of GI bleed and will need eventual SNF placement.  He underwent EGD 9/9 with no significant findings and will have colonoscopy performed today.  He may then go to SNF with likely outpatient palliative services.  Assessment & Plan:   Principal Problem:   Iron deficiency anemia secondary to blood loss (chronic) Active Problems:   Essential hypertension   Iron deficiency anemia due to chronic blood loss   Upper GI bleed   Hypercholesterolemia   Benign prostatic hyperplasia with urinary obstruction   Essential (primary) hypertension   Prostate cancer ([name]   Diabetic peripheral neuropathy ([name]   Obstructive sleep apnea of adult   Controlled type 2 diabetes mellitus with hyperglycemia, without long-term current use of insulin ([name]   Acute blood loss anemia   History of CVA (cerebrovascular accident)   Symptomatic anemia   Grade I diastolic dysfunction   Acute respiratory distress   Atelectasis  Assessment and Plan:   Acute blood loss anemia due to suspected GI bleed No abdominal tenderness Status post 2 unit transfusion with stability noted Recheck CBC in the morning Has iron deficiency anemia that is chronic from chronic prior bleeds Upper GI bleed GI consulted with EGD 9/9 showing only hiatal hernia and colonoscopy pending once respiratory status improves and patient can tolerate prep Currently n.p.o. for colonoscopy today PPI twice daily GI for colonoscopy today Hypertension Continue antihypertensives Diabetes Sliding scale insulin History of  CVA Grade 1 diastolic dysfunction Acute hypoxemic respiratory failure-improved -Given Lasix for diuresis 9/10 -Does not appear to have pneumonia     DVT prophylaxis: SCDs Code Status: Full Family Communication: None at bedside Disposition Plan:  Status is: Observation The patient will require care spanning > 2 midnights and should be moved to inpatient because: Due to some worsening hypoxemia and need for IV medications.     Skin Assessment:   I have examined the patient's skin and I agree with the wound assessment as performed by the wound care RN as outlined below:   Pressure Injury 08/08/22 Buttocks Right Stage 2 -  Partial thickness loss of dermis presenting as a shallow open injury with a red, pink wound bed without slough. (Active)  08/08/22 2130  Location: Buttocks  Location Orientation: Right  Staging: Stage 2 -  Partial thickness loss of dermis presenting as a shallow open injury with a red, pink wound bed without slough.  Wound Description (Comments):   Present on Admission: Yes     Pressure Injury 08/08/22 Buttocks Left Stage 2 -  Partial thickness loss of dermis presenting as a shallow open injury with a red, pink wound bed without slough. (Active)  08/08/22 2130  Location: Buttocks  Location Orientation: Left  Staging: Stage 2 -  Partial thickness loss of dermis presenting as a shallow open injury with a red, pink wound bed without slough.  Wound Description (Comments):   Present on Admission: Yes      Consultants:  GI Palliative   Procedures:  None   Antimicrobials:  None    Subjective: Patient seen and evaluated today with no new  acute complaints or concerns. No acute concerns or events noted overnight.  Objective: Vitals:   08/12/22 0626 08/12/22 0752 08/12/22 1443 08/12/22 1450  BP: 132/62 (!) 150/81 (!) 128/91 (!) 166/82  Pulse: (!) 58 61 66   Resp:   (!) 7   Temp: 97.6 F (36.4 C)  98.2 F (36.8 C)   TempSrc: Oral  Oral   SpO2: 98%  96%    Weight:   77.3 kg   Height:   '6\' 2"'$  (1.88 m)     Intake/Output Summary (Last 24 hours) at 08/12/2022 1459 Last data filed at 08/12/2022 0700 Gross per 24 hour  Intake 720 ml  Output 1600 ml  Net -880 ml   Filed Weights   08/08/22 2138 08/12/22 1443  Weight: 77.3 kg 77.3 kg    Examination:  General exam: Appears calm and comfortable  Respiratory system: Clear to auscultation. Respiratory effort normal. Cardiovascular system: S1 & S2 heard, RRR.  Gastrointestinal system: Abdomen is soft Central nervous system: Alert and awake Extremities: No edema Skin: No significant lesions noted Psychiatry: Flat affect.    Data Reviewed: I have personally reviewed following labs and imaging studies  CBC: Recent Labs  Lab 08/08/22 1532 08/09/22 0416 08/10/22 0512 08/11/22 0454 08/12/22 0333  WBC 9.7 8.2 13.5* 7.0 6.7  HGB 7.1* 8.0* 7.8* 7.7* 8.4*  HCT 24.6* 27.2* 27.1* 25.7* 28.8*  MCV 96.1 95.4 96.8 95.2 95.7  PLT 254 241 249 231 517   Basic Metabolic Panel: Recent Labs  Lab 08/08/22 1532 08/09/22 0416 08/10/22 0512 08/11/22 0454 08/12/22 0333  NA 140 142 141 140 142  K 3.8 3.9 3.4* 3.2* 4.1  CL 106 110 109 108 109  CO2 '29 27 27 28 29  '$ GLUCOSE 113* 96 114* 98 91  BUN '13 12 11 8 '$ 7*  CREATININE 0.75 0.74 0.72 0.76 0.85  CALCIUM 9.4 9.1 8.7* 8.7* 9.2  MG  --   --  1.8 1.7 1.8   GFR: Estimated Creatinine Clearance: 63.2 mL/min (by C-G formula based on SCr of 0.85 mg/dL). Liver Function Tests: Recent Labs  Lab 08/08/22 1532  AST 10*  ALT 8  ALKPHOS 53  BILITOT 0.6  PROT 6.5  ALBUMIN 3.0*   No results for input(s): "LIPASE", "AMYLASE" in the last 168 hours. No results for input(s): "AMMONIA" in the last 168 hours. Coagulation Profile: No results for input(s): "INR", "PROTIME" in the last 168 hours. Cardiac Enzymes: No results for input(s): "CKTOTAL", "CKMB", "CKMBINDEX", "TROPONINI" in the last 168 hours. BNP (last 3 results) No results for input(s):  "PROBNP" in the last 8760 hours. HbA1C: No results for input(s): "HGBA1C" in the last 72 hours. CBG: Recent Labs  Lab 08/08/22 2140 08/09/22 1134  GLUCAP 100* 93   Lipid Profile: No results for input(s): "CHOL", "HDL", "LDLCALC", "TRIG", "CHOLHDL", "LDLDIRECT" in the last 72 hours. Thyroid Function Tests: No results for input(s): "TSH", "T4TOTAL", "FREET4", "T3FREE", "THYROIDAB" in the last 72 hours. Anemia Panel: No results for input(s): "VITAMINB12", "FOLATE", "FERRITIN", "TIBC", "IRON", "RETICCTPCT" in the last 72 hours. Sepsis Labs: Recent Labs  Lab 08/08/22 1532 08/08/22 1609 08/10/22 0512  PROCALCITON  --   --  0.15  LATICACIDVEN 1.1 1.2  --     Recent Results (from the past 240 hour(s))  Blood culture (routine x 2)     Status: None (Preliminary result)   Collection Time: 08/08/22  2:09 PM   Specimen: BLOOD RIGHT HAND  Result Value Ref Range Status   Specimen  Description BLOOD RIGHT HAND BOTTLES DRAWN AEROBIC ONLY  Final   Special Requests   Final    Blood Culture results may not be optimal due to an inadequate volume of blood received in culture bottles   Culture   Final    NO GROWTH 4 DAYS Performed at [facility], [address], [place], [place] [address]    Report Status PENDING  Incomplete  Urine Culture     Status: Abnormal   Collection Time: 08/08/22  2:09 PM   Specimen: Urine, Clean Catch  Result Value Ref Range Status   Specimen Description   Final    URINE, CLEAN CATCH Performed at [facility], [address], [place], [place] [address]    Special Requests   Final    NONE Performed at [facility], [address], [place], [place] [address]    Culture 20,000 COLONIES/mL KLEBSIELLA PNEUMONIAE (A)  Final   Report Status 08/11/2022 FINAL  Final   Organism ID, Bacteria KLEBSIELLA PNEUMONIAE (A)  Final      Susceptibility   Klebsiella pneumoniae - MIC*    AMPICILLIN >=32 RESISTANT Resistant     CEFAZOLIN <=4 SENSITIVE Sensitive     CEFEPIME <=0.12  SENSITIVE Sensitive     CEFTRIAXONE <=0.25 SENSITIVE Sensitive     CIPROFLOXACIN <=0.25 SENSITIVE Sensitive     GENTAMICIN <=1 SENSITIVE Sensitive     IMIPENEM <=0.25 SENSITIVE Sensitive     NITROFURANTOIN 64 INTERMEDIATE Intermediate     TRIMETH/SULFA >=320 RESISTANT Resistant     AMPICILLIN/SULBACTAM 8 SENSITIVE Sensitive     PIP/TAZO <=4 SENSITIVE Sensitive     * 20,000 COLONIES/mL KLEBSIELLA PNEUMONIAE  Blood culture (routine x 2)     Status: None (Preliminary result)   Collection Time: 08/08/22  5:58 PM   Specimen: BLOOD RIGHT HAND  Result Value Ref Range Status   Specimen Description BLOOD RIGHT HAND BOTTLES DRAWN AEROBIC ONLY  Final   Special Requests   Final    Blood Culture results may not be optimal due to an inadequate volume of blood received in culture bottles   Culture   Final    NO GROWTH 4 DAYS Performed at  [facility], [address] [place],  [address]    Report Status PENDING  Incomplete         Radiology Studies: No results found.      Scheduled Meds:  [MAR Hold] atorvastatin  40 mg Oral Daily   [MAR Hold] escitalopram  20 mg Oral Daily   [MAR Hold] finasteride  5 mg Oral Daily   [MAR Hold] pantoprazole (PROTONIX) IV  40 mg Intravenous Q12H   [MAR Hold] pregabalin  75 mg Oral BID   [MAR Hold] topiramate  25 mg Oral BID   Continuous Infusions:  sodium chloride     sodium chloride     lactated ringers 50 mL/hr at 08/12/22 1449     LOS: 0 days    Time spent: 35 minutes    Daana Petrasek Darleen Crocker, DO [facility]  If 7PM-7AM, please contact night-coverage [email or web address] 08/12/2022, 2:59 PM

## 2022-08-13 ENCOUNTER — Encounter (HOSPITAL_COMMUNITY)
Admission: EM | Disposition: A | Discharge status: Home Health | Payer: Self-pay | Source: Home / Self Care | Attending: Internal Medicine

## 2022-08-13 DIAGNOSIS — L8990 Pressure ulcer of unspecified site, unspecified stage: Secondary | ICD-10-CM | POA: Insufficient documentation

## 2022-08-13 DIAGNOSIS — D62 Acute posthemorrhagic anemia: Secondary | ICD-10-CM | POA: Diagnosis present

## 2022-08-13 DIAGNOSIS — N138 Other obstructive and reflux uropathy: Secondary | ICD-10-CM | POA: Diagnosis present

## 2022-08-13 DIAGNOSIS — K921 Melena: Secondary | ICD-10-CM | POA: Diagnosis present

## 2022-08-13 DIAGNOSIS — E1122 Type 2 diabetes mellitus with diabetic chronic kidney disease: Secondary | ICD-10-CM | POA: Diagnosis present

## 2022-08-13 DIAGNOSIS — E7800 Pure hypercholesterolemia, unspecified: Secondary | ICD-10-CM | POA: Diagnosis present

## 2022-08-13 DIAGNOSIS — K922 Gastrointestinal hemorrhage, unspecified: Secondary | ICD-10-CM | POA: Diagnosis present

## 2022-08-13 DIAGNOSIS — E876 Hypokalemia: Secondary | ICD-10-CM | POA: Diagnosis present

## 2022-08-13 DIAGNOSIS — F32A Depression, unspecified: Secondary | ICD-10-CM | POA: Diagnosis present

## 2022-08-13 DIAGNOSIS — J9601 Acute respiratory failure with hypoxia: Secondary | ICD-10-CM | POA: Diagnosis present

## 2022-08-13 DIAGNOSIS — M353 Polymyalgia rheumatica: Secondary | ICD-10-CM | POA: Diagnosis present

## 2022-08-13 DIAGNOSIS — Z515 Encounter for palliative care: Secondary | ICD-10-CM | POA: Diagnosis not present

## 2022-08-13 DIAGNOSIS — N401 Enlarged prostate with lower urinary tract symptoms: Secondary | ICD-10-CM

## 2022-08-13 DIAGNOSIS — F0390 Unspecified dementia without behavioral disturbance: Secondary | ICD-10-CM | POA: Diagnosis present

## 2022-08-13 DIAGNOSIS — D500 Iron deficiency anemia secondary to blood loss (chronic): Secondary | ICD-10-CM | POA: Diagnosis present

## 2022-08-13 DIAGNOSIS — E1142 Type 2 diabetes mellitus with diabetic polyneuropathy: Secondary | ICD-10-CM | POA: Diagnosis present

## 2022-08-13 DIAGNOSIS — J9811 Atelectasis: Secondary | ICD-10-CM | POA: Diagnosis present

## 2022-08-13 DIAGNOSIS — L89322 Pressure ulcer of left buttock, stage 2: Secondary | ICD-10-CM | POA: Diagnosis present

## 2022-08-13 DIAGNOSIS — Z7189 Other specified counseling: Secondary | ICD-10-CM | POA: Diagnosis not present

## 2022-08-13 DIAGNOSIS — G4733 Obstructive sleep apnea (adult) (pediatric): Secondary | ICD-10-CM | POA: Diagnosis present

## 2022-08-13 DIAGNOSIS — L89312 Pressure ulcer of right buttock, stage 2: Secondary | ICD-10-CM | POA: Diagnosis present

## 2022-08-13 DIAGNOSIS — I129 Hypertensive chronic kidney disease with stage 1 through stage 4 chronic kidney disease, or unspecified chronic kidney disease: Secondary | ICD-10-CM | POA: Diagnosis present

## 2022-08-13 DIAGNOSIS — R0603 Acute respiratory distress: Secondary | ICD-10-CM | POA: Diagnosis not present

## 2022-08-13 DIAGNOSIS — E1165 Type 2 diabetes mellitus with hyperglycemia: Secondary | ICD-10-CM | POA: Diagnosis present

## 2022-08-13 HISTORY — PX: GIVENS CAPSULE STUDY: SHX5432

## 2022-08-13 LAB — CULTURE, BLOOD (ROUTINE X 2)
Culture: NO GROWTH
Culture: NO GROWTH

## 2022-08-13 LAB — BASIC METABOLIC PANEL
Anion gap: 5 (ref 5–15)
BUN: 8 mg/dL (ref 8–23)
CO2: 27 mmol/L (ref 22–32)
Calcium: 8.5 mg/dL — ABNORMAL LOW (ref 8.9–10.3)
Chloride: 105 mmol/L (ref 98–111)
Creatinine, Ser: 0.87 mg/dL (ref 0.61–1.24)
GFR, Estimated: >60 mL/min (ref >60)
Glucose, Bld: 112 mg/dL — ABNORMAL HIGH (ref 70–99)
Potassium: 3.2 mmol/L — ABNORMAL LOW (ref 3.5–5.1)
Sodium: 137 mmol/L (ref 135–145)

## 2022-08-13 LAB — MAGNESIUM: Magnesium: 1.6 mg/dL — ABNORMAL LOW (ref 1.7–2.4)

## 2022-08-13 LAB — CBC
HCT: 27.5 % — ABNORMAL LOW (ref 39.0–52.0)
Hemoglobin: 8.2 g/dL — ABNORMAL LOW (ref 13.0–17.0)
MCH: 28.1 pg (ref 26.0–34.0)
MCHC: 29.8 g/dL — ABNORMAL LOW (ref 30.0–36.0)
MCV: 94.2 fL (ref 80.0–100.0)
Platelets: 244 10*3/uL (ref 150–400)
RBC: 2.92 MIL/uL — ABNORMAL LOW (ref 4.22–5.81)
RDW: 15 % (ref 11.5–15.5)
WBC: 7.5 10*3/uL (ref 4.0–10.5)
nRBC: 0 % (ref 0.0–0.2)

## 2022-08-13 SURGERY — IMAGING PROCEDURE, GI TRACT, INTRALUMINAL, VIA CAPSULE

## 2022-08-13 MED ORDER — POTASSIUM CHLORIDE 10 MEQ/100ML IV SOLN
10.0000 meq | INTRAVENOUS | Status: AC
  Administered 2022-08-13 (×3): 10 meq via INTRAVENOUS
  Filled 2022-08-13 (×3): qty 100

## 2022-08-13 MED ORDER — MAGNESIUM SULFATE 4 GM/100ML IV SOLN
4.0000 g | Freq: Once | INTRAVENOUS | Status: AC
  Administered 2022-08-13: 4 g via INTRAVENOUS
  Filled 2022-08-13: qty 100

## 2022-08-13 NOTE — Progress Notes (Signed)
    Subjective: Patient doing well today. He has no complaints, denies nausea, vomiting, abdominal pain. No sob, lightheadedness or dizziness. No BMs today.   Objective: Vital signs in last 24 hours: Temp:  [97.8 F (36.6 C)-98.3 F (36.8 C)] 98 F (36.7 C) (09/13 0453) Pulse Rate:  [63-79] 63 (09/13 0453) Resp:  [7-18] 18 (09/13 0453) BP: (102-166)/(43-91) 103/52 (09/13 0453) SpO2:  [92 %-100 %] 94 % (09/13 0453) Weight:  [77.3 kg] 77.3 kg (09/12 1443) Last BM Date : 08/12/22 General:   Alert and oriented, pleasant Head:  Normocephalic and atraumatic. Eyes:  No icterus, sclera clear. Conjuctiva pink.  Mouth:  Without lesions, mucosa pink and moist.  Heart:  murmur noted Lungs: Clear to auscultation bilaterally, without wheezing, rales, or rhonchi.  Abdomen:  Bowel sounds present, soft, non-tender, non-distended. No HSM or hernias noted. No rebound or guarding. No masses appreciated  Msk:  Symmetrical without gross deformities. Normal posture. Pulses:  Normal pulses noted. Extremities:  Without clubbing or edema. Neurologic:  Alert and  oriented x4;  grossly normal neurologically. Skin:  Warm and dry, intact without significant lesions.  Psych:  Alert and cooperative. Normal mood and affect.  Intake/Output from previous day: 09/12 0701 - 09/13 0700 In: 1007.5 [P.O.:480; I.V.:527.5] Out: 250 [Urine:250] Intake/Output this shift: No intake/output data recorded.  Lab Results: Recent Labs    08/11/22 0454 08/12/22 0333 08/13/22 0353  WBC 7.0 6.7 7.5  HGB 7.7* 8.4* 8.2*  HCT 25.7* 28.8* 27.5*  PLT 231 234 244   BMET Recent Labs    08/11/22 0454 08/12/22 0333 08/13/22 0353  NA 140 142 137  K 3.2* 4.1 3.2*  CL 108 109 105  CO2 '28 29 27  '$ GLUCOSE 98 91 112*  BUN 8 7* 8  CREATININE 0.76 0.85 0.87  CALCIUM 8.7* 9.2 8.5*   Assessment: Jared Bridges is a 88 y.o. male with a history of depression, diverticulitis, history of gastric ulcers, PMR, stroke, TIA,  diabetes, who came to the hospital after presenting worsening fatigue and weakness. He was found to have worsening anemia with Hgb  7.1 and heme positive stool. He received 1 unit PRBCs. There was concern for melena.  EGD 9/9 was overall unrevealing. Colonoscopy done yesterday was also unrevealing. Plan for givens capsule placement today after discussion held with Dr. Jenetta Downer, Patient and daughter Jared Bridges regarding further investigation vs. Conservative management of anemia. Hemoglobin is stable at 8.2. no overt GI bleeding.   Plan: Givens capsule placement today Appreciate palliative care consult Continue to monitor h&h and for overt GI bleeding PPI BID   LOS: 0 days    08/13/2022, 10:25 AM   Tanyia Grabbe L. Alver Sorrow, MSN, APRN, AGNP-C Adult-Gerontology Nurse Practitioner [facility]

## 2022-08-13 NOTE — Progress Notes (Signed)
Palliative: Mr. Bissonnette is lying quietly in bed.  He is resting comfortably, but wakes easily when I call his name.  He is alert and oriented, able to make his basic needs known.  There is no family present at bedside at this time.  We talk about his colonoscopy yesterday, the results.  Overall, Mr. Castrogiovanni seems to understand his acute illness and the treatment plan.  We talk about PillCam today.  As we are talking, worker arrives to administer PillCam.  No questions. Conference with GI NP related to patient condition, needs.  I return later in the day to speak with daughter, Niguel Moure.  She is not present.  Mr. Bottino is awake and watching TV.  He shares that she will be coming later in the day to bring him a meal from home.  I will leave a note for [name] sharing that PillCam was administered today, and palliative will follow-up tomorrow.  Conference with attending, bedside nursing staff, transition of care team related to patient condition, needs, goals of care, disposition.  Plan: At this point continue full scope/full code.  Time for outcomes, test results.  Anticipate home with home health services already in place  35 minutes Quinn Axe, NP Palliative medicine team Team phone [phone] Greater than 50% of this time was spent counseling and coordinating care related to the above assessment and plan.

## 2022-08-13 NOTE — Progress Notes (Addendum)
PROGRESS NOTE    Jared Bridges  RXV:[id number] DOB: 01/02/1930 DOA: 08/08/2022 PCP: [name], MD   Brief Narrative:    Jared Bridges is a 88 y.o. male with medical history significant of polymyalgia rheumatica, stroke, diabetes, dementia, hypertension, weakness, fatigue.  Patient was having some hypotension with confusion and weakness as well as melena.  He has been admitted with acute blood loss anemia in the setting of GI bleed and will need eventual SNF placement.  He underwent EGD 9/9 with no significant findings and will have colonoscopy performed today.  He may then go to SNF with likely outpatient palliative services.  Assessment & Plan:   Principal Problem:   Iron deficiency anemia secondary to blood loss (chronic) Active Problems:   Essential hypertension   Iron deficiency anemia due to chronic blood loss   Upper GI bleed   Hypercholesterolemia   Benign prostatic hyperplasia with urinary obstruction   Essential (primary) hypertension   Prostate cancer ([name]   Diabetic peripheral neuropathy ([name]   Obstructive sleep apnea of adult   Controlled type 2 diabetes mellitus with hyperglycemia, without long-term current use of insulin ([name]   Acute blood loss anemia   History of CVA (cerebrovascular accident)   Symptomatic anemia   Grade I diastolic dysfunction   Acute respiratory distress   Atelectasis   Pressure injury of skin   GI bleed  Assessment and Plan:   Acute blood loss anemia due to suspected GI bleed No abdominal tenderness Status post 2 unit transfusion with stability noted Following CBC Has iron deficiency anemia that is chronic from chronic prior bleeds  Upper GI bleed GI consulted with EGD 9/9 showing only hiatal hernia and colonoscopy  respiratory status improves and patient can tolerate prep Currently n.p.o.  PPI twice daily GI for capsule endoscopy today  Hypertension Continue antihypertensives  Type 2 diabetes mellitus  Add cbg  monitoring   History of CVA - stable   Grade 1 diastolic dysfunction - stable   Hypokalemia/Hypomagnesemia - likely from refeeding syndrome;  IV replacement ordered, recheck in AM   Acute hypoxemic respiratory failure-improved -Given Lasix for diuresis 9/10 -Does not appear to have pneumonia     DVT prophylaxis: SCDs Code Status: Full Family Communication: None at bedside Disposition Plan:  Status is: Observation The patient will require care spanning > 2 midnights and should be moved to inpatient because: Due to some worsening hypoxemia and need for IV medications.     Skin Assessment:   I have examined the patient's skin and I agree with the wound assessment as performed by the wound care RN as outlined below:   Pressure Injury 08/08/22 Buttocks Right Stage 2 -  Partial thickness loss of dermis presenting as a shallow open injury with a red, pink wound bed without slough. (Active)  08/08/22 2130  Location: Buttocks  Location Orientation: Right  Staging: Stage 2 -  Partial thickness loss of dermis presenting as a shallow open injury with a red, pink wound bed without slough.  Wound Description (Comments):   Present on Admission: Yes     Pressure Injury 08/08/22 Buttocks Left Stage 2 -  Partial thickness loss of dermis presenting as a shallow open injury with a red, pink wound bed without slough. (Active)  08/08/22 2130  Location: Buttocks  Location Orientation: Left  Staging: Stage 2 -  Partial thickness loss of dermis presenting as a shallow open injury with a red, pink wound bed without slough.  Wound Description (Comments):  Present on Admission: Yes      Consultants:  GI Palliative   Procedures:  None   Antimicrobials:  None    Subjective: Agreeable to getting capsule endoscopy study done.  No specific complaints.   Objective: Vitals:   08/12/22 1622 08/12/22 2138 08/13/22 0453 08/13/22 1225  BP: 127/62 (!) 102/43 (!) 103/52 112/63  Pulse: 77 64 63 65   Resp: '16 18 18 18  '$ Temp: 98.1 F (36.7 C) 98.3 F (36.8 C) 98 F (36.7 C) 98.1 F (36.7 C)  TempSrc: Oral  Axillary Axillary  SpO2: 98% 92% 94% 94%  Weight:      Height:        Intake/Output Summary (Last 24 hours) at 08/13/2022 1311 Last data filed at 08/13/2022 0900 Gross per 24 hour  Intake 1007.5 ml  Output 250 ml  Net 757.5 ml   Filed Weights   08/08/22 2138 08/12/22 1443  Weight: 77.3 kg 77.3 kg    Examination:  General exam: frail, elderly male, Appears calm and comfortable  Respiratory system: Clear to auscultation. Respiratory effort normal. Cardiovascular system: normal S1 & S2 heard.  Gastrointestinal system: Abdomen is soft Central nervous system: Alert and awake Extremities: No edema Skin: No significant lesions noted Psychiatry: Flat affect.  Data Reviewed: I have personally reviewed following labs and imaging studies  CBC: Recent Labs  Lab 08/09/22 0416 08/10/22 0512 08/11/22 0454 08/12/22 0333 08/13/22 0353  WBC 8.2 13.5* 7.0 6.7 7.5  HGB 8.0* 7.8* 7.7* 8.4* 8.2*  HCT 27.2* 27.1* 25.7* 28.8* 27.5*  MCV 95.4 96.8 95.2 95.7 94.2  PLT 241 249 231 234 382   Basic Metabolic Panel: Recent Labs  Lab 08/09/22 0416 08/10/22 0512 08/11/22 0454 08/12/22 0333 08/13/22 0353  NA 142 141 140 142 137  K 3.9 3.4* 3.2* 4.1 3.2*  CL 110 109 108 109 105  CO2 '27 27 28 29 27  '$ GLUCOSE 96 114* 98 91 112*  BUN '12 11 8 '$ 7* 8  CREATININE 0.74 0.72 0.76 0.85 0.87  CALCIUM 9.1 8.7* 8.7* 9.2 8.5*  MG  --  1.8 1.7 1.8 1.6*   GFR: Estimated Creatinine Clearance: 61.7 mL/min (by C-G formula based on SCr of 0.87 mg/dL). Liver Function Tests: Recent Labs  Lab 08/08/22 1532  AST 10*  ALT 8  ALKPHOS 53  BILITOT 0.6  PROT 6.5  ALBUMIN 3.0*   No results for input(s): "LIPASE", "AMYLASE" in the last 168 hours. No results for input(s): "AMMONIA" in the last 168 hours. Coagulation Profile: No results for input(s): "INR", "PROTIME" in the last 168  hours. Cardiac Enzymes: No results for input(s): "CKTOTAL", "CKMB", "CKMBINDEX", "TROPONINI" in the last 168 hours. BNP (last 3 results) No results for input(s): "PROBNP" in the last 8760 hours. HbA1C: No results for input(s): "HGBA1C" in the last 72 hours. CBG: Recent Labs  Lab 08/08/22 2140 08/09/22 1134  GLUCAP 100* 93   Lipid Profile: No results for input(s): "CHOL", "HDL", "LDLCALC", "TRIG", "CHOLHDL", "LDLDIRECT" in the last 72 hours. Thyroid Function Tests: No results for input(s): "TSH", "T4TOTAL", "FREET4", "T3FREE", "THYROIDAB" in the last 72 hours. Anemia Panel: No results for input(s): "VITAMINB12", "FOLATE", "FERRITIN", "TIBC", "IRON", "RETICCTPCT" in the last 72 hours. Sepsis Labs: Recent Labs  Lab 08/08/22 1532 08/08/22 1609 08/10/22 0512  PROCALCITON  --   --  0.15  LATICACIDVEN 1.1 1.2  --     Recent Results (from the past 240 hour(s))  Blood culture (routine x 2)     Status:  None   Collection Time: 08/08/22  2:09 PM   Specimen: BLOOD RIGHT HAND  Result Value Ref Range Status   Specimen Description BLOOD RIGHT HAND BOTTLES DRAWN AEROBIC ONLY  Final   Special Requests   Final    Blood Culture results may not be optimal due to an inadequate volume of blood received in culture bottles   Culture   Final    NO GROWTH 5 DAYS Performed at [facility], [address] [place], [place] [address]    Report Status 08/13/2022 FINAL  Final  Urine Culture     Status: Abnormal   Collection Time: 08/08/22  2:09 PM   Specimen: Urine, Clean Catch  Result Value Ref Range Status   Specimen Description   Final    URINE, CLEAN CATCH Performed at [facility], [address], [place], [place] [address]    Special Requests   Final    NONE Performed at [facility], [address], [place], [place] [address]    Culture 20,000 COLONIES/mL KLEBSIELLA PNEUMONIAE (A)  Final   Report Status 08/11/2022 FINAL  Final   Organism ID, Bacteria KLEBSIELLA PNEUMONIAE (A)  Final       Susceptibility   Klebsiella pneumoniae - MIC*    AMPICILLIN >=32 RESISTANT Resistant     CEFAZOLIN <=4 SENSITIVE Sensitive     CEFEPIME <=0.12 SENSITIVE Sensitive     CEFTRIAXONE <=0.25 SENSITIVE Sensitive     CIPROFLOXACIN <=0.25 SENSITIVE Sensitive     GENTAMICIN <=1 SENSITIVE Sensitive     IMIPENEM <=0.25 SENSITIVE Sensitive     NITROFURANTOIN 64 INTERMEDIATE Intermediate     TRIMETH/SULFA >=320 RESISTANT Resistant     AMPICILLIN/SULBACTAM 8 SENSITIVE Sensitive     PIP/TAZO <=4 SENSITIVE Sensitive     * 20,000 COLONIES/mL KLEBSIELLA PNEUMONIAE  Blood culture (routine x 2)     Status: None   Collection Time: 08/08/22  5:58 PM   Specimen: BLOOD RIGHT HAND  Result Value Ref Range Status   Specimen Description BLOOD RIGHT HAND BOTTLES DRAWN AEROBIC ONLY  Final   Special Requests   Final    Blood Culture results may not be optimal due to an inadequate volume of blood received in culture bottles   Culture   Final    NO GROWTH 5 DAYS Performed at [facility], [address], [place], [place] [address]    Report Status 08/13/2022 FINAL  Final    Radiology Studies: No results found.   Scheduled Meds:  [MAR Hold] atorvastatin  40 mg Oral Daily   [MAR Hold] escitalopram  20 mg Oral Daily   [MAR Hold] finasteride  5 mg Oral Daily   [MAR Hold] pantoprazole (PROTONIX) IV  40 mg Intravenous Q12H   [MAR Hold] pregabalin  75 mg Oral BID   [MAR Hold] topiramate  25 mg Oral BID   Continuous Infusions:  [MAR Hold] potassium chloride 10 mEq (08/13/22 1209)    LOS: 0 days   Time spent: 35 minutes  Alma Muegge, MD [facility]  If 7PM-7AM, please contact night-coverage [email or web address] 08/13/2022, 1:11 PM

## 2022-08-13 NOTE — TOC Progression Note (Signed)
Transition of Care [facility]) - Progression Note    Patient Details  Name: Jared Bridges MRN: [id number] Date of Birth: 11/14/1930  Transition of Care [facility]) CM/SW Contact  [name], LCSW Phone Number: 08/13/2022, 10:55 AM  Clinical Narrative:     TOC following. Pt's insurance has denied SNF rehab. Spoke with pt's daughter, Jared Bridges, to update. Per Jared Bridges, she is anticipating that when pt is medically stable for dc, that she will take him home with continued [place] from [facility]. Discussed DME needs with daughter who states that they might need a [place] lift if pt unable to stand on his own. Jared Bridges aware that [facility] can arrange for this if needed.  DC timeframe not yet known. Will follow.  Expected Discharge Plan: [place] Barriers to Discharge: Continued Medical Work up  Expected Discharge Plan and Services Expected Discharge Plan: [place] In-house Referral: Clinical Social Work Discharge Planning Services: CM Consult Post Acute Care Choice: [place] arrangements for the past 2 months: Single Family Home                                       Social Determinants of Health (SDOH) Interventions    Readmission Risk Interventions    06/23/2022    1:26 PM  Readmission Risk Prevention Plan  Transportation Screening Complete  HRI or [place] Complete  Social Work Consult for [place] Planning/Counseling Complete  Palliative Care Screening Not Applicable  Medication Review Press photographer) Complete

## 2022-08-13 NOTE — Progress Notes (Signed)
PT Cancellation Note  Patient Details Name: LOT MEDFORD MRN: [id number] DOB: Feb 25, 1930   Cancelled Treatment:    Reason Eval/Treat Not Completed: Patient at procedure or test/unavailable.  Patient out for procedure, will check back tomorrow.   3:55 PM, 08/13/22 Lonell Grandchild, MPT Physical Therapist with [facility] [phone] office [phone] mobile phone

## 2022-08-14 ENCOUNTER — Telehealth: Payer: Self-pay | Admitting: Gastroenterology

## 2022-08-14 DIAGNOSIS — D62 Acute posthemorrhagic anemia: Secondary | ICD-10-CM | POA: Diagnosis not present

## 2022-08-14 DIAGNOSIS — R0603 Acute respiratory distress: Secondary | ICD-10-CM | POA: Diagnosis not present

## 2022-08-14 DIAGNOSIS — Z515 Encounter for palliative care: Secondary | ICD-10-CM | POA: Diagnosis not present

## 2022-08-14 DIAGNOSIS — D500 Iron deficiency anemia secondary to blood loss (chronic): Secondary | ICD-10-CM | POA: Diagnosis not present

## 2022-08-14 DIAGNOSIS — Z7189 Other specified counseling: Secondary | ICD-10-CM | POA: Diagnosis not present

## 2022-08-14 DIAGNOSIS — J9811 Atelectasis: Secondary | ICD-10-CM | POA: Diagnosis not present

## 2022-08-14 LAB — BASIC METABOLIC PANEL
Anion gap: 6 (ref 5–15)
BUN: 9 mg/dL (ref 8–23)
CO2: 25 mmol/L (ref 22–32)
Calcium: 8.5 mg/dL — ABNORMAL LOW (ref 8.9–10.3)
Chloride: 107 mmol/L (ref 98–111)
Creatinine, Ser: 0.9 mg/dL (ref 0.61–1.24)
GFR, Estimated: >60 mL/min (ref >60)
Glucose, Bld: 137 mg/dL — ABNORMAL HIGH (ref 70–99)
Potassium: 3.5 mmol/L (ref 3.5–5.1)
Sodium: 138 mmol/L (ref 135–145)

## 2022-08-14 LAB — CBC
HCT: 27.3 % — ABNORMAL LOW (ref 39.0–52.0)
Hemoglobin: 8.3 g/dL — ABNORMAL LOW (ref 13.0–17.0)
MCH: 28.7 pg (ref 26.0–34.0)
MCHC: 30.4 g/dL (ref 30.0–36.0)
MCV: 94.5 fL (ref 80.0–100.0)
Platelets: 228 10*3/uL (ref 150–400)
RBC: 2.89 MIL/uL — ABNORMAL LOW (ref 4.22–5.81)
RDW: 15 % (ref 11.5–15.5)
WBC: 6.1 10*3/uL (ref 4.0–10.5)
nRBC: 0 % (ref 0.0–0.2)

## 2022-08-14 LAB — MAGNESIUM: Magnesium: 2.2 mg/dL (ref 1.7–2.4)

## 2022-08-14 LAB — SURGICAL PATHOLOGY

## 2022-08-14 LAB — HEMOGLOBIN A1C
Hgb A1c MFr Bld: 4.8 % (ref 4.8–5.6)
Mean Plasma Glucose: 91.06 mg/dL

## 2022-08-14 LAB — GLUCOSE, CAPILLARY: Glucose-Capillary: 129 mg/dL — ABNORMAL HIGH (ref 70–99)

## 2022-08-14 MED ORDER — FERROUS SULFATE 325 (65 FE) MG PO TABS
325.0000 mg | ORAL_TABLET | Freq: Two times a day (BID) | ORAL | Status: DC
  Administered 2022-08-14 – 2022-08-15 (×2): 325 mg via ORAL
  Filled 2022-08-14 (×2): qty 1

## 2022-08-14 MED ORDER — INSULIN ASPART 100 UNIT/ML IJ SOLN
0.0000 [IU] | Freq: Three times a day (TID) | INTRAMUSCULAR | Status: DC
  Administered 2022-08-14 – 2022-08-15 (×2): 1 [IU] via SUBCUTANEOUS

## 2022-08-14 NOTE — Progress Notes (Signed)
PROGRESS NOTE    [name]  PFX:[id number] DOB: February 25, 1930 DOA: 08/08/2022 PCP: [name], MD   Brief Narrative:    Jared Bridges is a 88 y.o. male with medical history significant of polymyalgia rheumatica, stroke, diabetes, dementia, hypertension, weakness, fatigue.  Patient was having some hypotension with confusion and weakness as well as melena.  He has been admitted with acute blood loss anemia in the setting of GI bleed and will need eventual SNF placement.  He underwent EGD 9/9 with no significant findings and will have colonoscopy performed today.  He may then go to SNF with likely outpatient palliative services.  Assessment & Plan:   Principal Problem:   Iron deficiency anemia secondary to blood loss (chronic) Active Problems:   Essential hypertension   Iron deficiency anemia due to chronic blood loss   Upper GI bleed   Hypercholesterolemia   Benign prostatic hyperplasia with urinary obstruction   Essential (primary) hypertension   Prostate cancer ([name]   Diabetic peripheral neuropathy ([name]   Obstructive sleep apnea of adult   Controlled type 2 diabetes mellitus with hyperglycemia, without long-term current use of insulin ([name]   Acute blood loss anemia   History of CVA (cerebrovascular accident)   Symptomatic anemia   Grade I diastolic dysfunction   Acute respiratory distress   Atelectasis   Pressure injury of skin   GI bleed  Assessment and Plan:   Acute blood loss anemia due to suspected GI bleed No abdominal tenderness Status post 2 unit transfusion with stability noted Following CBC Has iron deficiency anemia that is chronic from chronic prior bleeds  Upper GI bleed GI consulted with EGD 9/9 showing only hiatal hernia and colonoscopy  respiratory status improves and patient can tolerate prep Currently n.p.o.  PPI twice daily GI for capsule endoscopy to be read later today Discussed with Dr. Jenetta Downer, can advance to reg diet  today  Hypertension Continue antihypertensives  Type 2 diabetes mellitus  Added cbg monitoring   History of CVA - stable   Grade 1 diastolic dysfunction - stable   Hypokalemia/Hypomagnesemia - likely from refeeding syndrome;  IV replacement ordered, recheck in AM   Acute hypoxemic respiratory failure-improved -Given Lasix for diuresis 9/10 -Does not appear to have pneumonia     DVT prophylaxis: SCDs Code Status: Full Family Communication:  Disposition Plan: family considering taking home, SNF denied by insurance Status is: Observation The patient will require care spanning > 2 midnights and should be moved to inpatient because: Due to some worsening hypoxemia and need for IV medications.     Skin Assessment:   I have examined the patient's skin and I agree with the wound assessment as performed by the wound care RN as outlined below:   Pressure Injury 08/08/22 Buttocks Right Stage 2 -  Partial thickness loss of dermis presenting as a shallow open injury with a red, pink wound bed without slough. (Active)  08/08/22 2130  Location: Buttocks  Location Orientation: Right  Staging: Stage 2 -  Partial thickness loss of dermis presenting as a shallow open injury with a red, pink wound bed without slough.  Wound Description (Comments):   Present on Admission: Yes     Pressure Injury 08/08/22 Buttocks Left Stage 2 -  Partial thickness loss of dermis presenting as a shallow open injury with a red, pink wound bed without slough. (Active)  08/08/22 2130  Location: Buttocks  Location Orientation: Left  Staging: Stage 2 -  Partial thickness loss of  dermis presenting as a shallow open injury with a red, pink wound bed without slough.  Wound Description (Comments):   Present on Admission: Yes      Consultants:  GI Palliative   Procedures:  None   Antimicrobials:  None    Subjective: Pt wanting and feeling ready to advance diet   Objective: Vitals:   08/13/22 1225  08/13/22 2157 08/14/22 0539 08/14/22 1145  BP: 112/63 (!) 114/53 (!) 120/58 (!) 123/50  Pulse: 65 62 63 (!) 58  Resp: '18 18 18 20  '$ Temp: 98.1 F (36.7 C) 98.6 F (37 C) 98.4 F (36.9 C) 97.9 F (36.6 C)  TempSrc: Axillary Oral    SpO2: 94% 97% 95% 97%  Weight:      Height:        Intake/Output Summary (Last 24 hours) at 08/14/2022 1228 Last data filed at 08/14/2022 0900 Gross per 24 hour  Intake 1345.96 ml  Output 2200 ml  Net -854.04 ml   Filed Weights   08/08/22 2138 08/12/22 1443  Weight: 77.3 kg 77.3 kg    Examination:  General exam: frail, elderly male, Appears calm and comfortable  Respiratory system: Clear to auscultation. Respiratory effort normal. Cardiovascular system: normal S1 & S2 heard.  Gastrointestinal system: Abdomen is soft Central nervous system: Alert and awake Extremities: No edema Skin: No significant lesions noted Psychiatry: normal affect.  Data Reviewed: I have personally reviewed following labs and imaging studies  CBC: Recent Labs  Lab 08/10/22 0512 08/11/22 0454 08/12/22 0333 08/13/22 0353 08/14/22 0352  WBC 13.5* 7.0 6.7 7.5 6.1  HGB 7.8* 7.7* 8.4* 8.2* 8.3*  HCT 27.1* 25.7* 28.8* 27.5* 27.3*  MCV 96.8 95.2 95.7 94.2 94.5  PLT 249 231 234 244 935   Basic Metabolic Panel: Recent Labs  Lab 08/10/22 0512 08/11/22 0454 08/12/22 0333 08/13/22 0353 08/14/22 0352  NA 141 140 142 137 138  K 3.4* 3.2* 4.1 3.2* 3.5  CL 109 108 109 105 107  CO2 '27 28 29 27 25  '$ GLUCOSE 114* 98 91 112* 137*  BUN 11 8 7* 8 9  CREATININE 0.72 0.76 0.85 0.87 0.90  CALCIUM 8.7* 8.7* 9.2 8.5* 8.5*  MG 1.8 1.7 1.8 1.6* 2.2   GFR: Estimated Creatinine Clearance: 59.6 mL/min (by C-G formula based on SCr of 0.9 mg/dL). Liver Function Tests: Recent Labs  Lab 08/08/22 1532  AST 10*  ALT 8  ALKPHOS 53  BILITOT 0.6  PROT 6.5  ALBUMIN 3.0*   No results for input(s): "LIPASE", "AMYLASE" in the last 168 hours. No results for input(s): "AMMONIA" in  the last 168 hours. Coagulation Profile: No results for input(s): "INR", "PROTIME" in the last 168 hours. Cardiac Enzymes: No results for input(s): "CKTOTAL", "CKMB", "CKMBINDEX", "TROPONINI" in the last 168 hours. BNP (last 3 results) No results for input(s): "PROBNP" in the last 8760 hours. HbA1C: No results for input(s): "HGBA1C" in the last 72 hours. CBG: Recent Labs  Lab 08/08/22 2140 08/09/22 1134  GLUCAP 100* 93   Lipid Profile: No results for input(s): "CHOL", "HDL", "LDLCALC", "TRIG", "CHOLHDL", "LDLDIRECT" in the last 72 hours. Thyroid Function Tests: No results for input(s): "TSH", "T4TOTAL", "FREET4", "T3FREE", "THYROIDAB" in the last 72 hours. Anemia Panel: No results for input(s): "VITAMINB12", "FOLATE", "FERRITIN", "TIBC", "IRON", "RETICCTPCT" in the last 72 hours. Sepsis Labs: Recent Labs  Lab 08/08/22 1532 08/08/22 1609 08/10/22 0512  PROCALCITON  --   --  0.15  LATICACIDVEN 1.1 1.2  --  Recent Results (from the past 240 hour(s))  Blood culture (routine x 2)     Status: None   Collection Time: 08/08/22  2:09 PM   Specimen: BLOOD RIGHT HAND  Result Value Ref Range Status   Specimen Description BLOOD RIGHT HAND BOTTLES DRAWN AEROBIC ONLY  Final   Special Requests   Final    Blood Culture results may not be optimal due to an inadequate volume of blood received in culture bottles   Culture   Final    NO GROWTH 5 DAYS Performed at [facility], [address] [place], [place] [address]    Report Status 08/13/2022 FINAL  Final  Urine Culture     Status: Abnormal   Collection Time: 08/08/22  2:09 PM   Specimen: Urine, Clean Catch  Result Value Ref Range Status   Specimen Description   Final    URINE, CLEAN CATCH Performed at [facility], [address] [place], [place] [address]    Special Requests   Final    NONE Performed at [facility], [address] [place], [place] [address]    Culture 20,000 COLONIES/mL KLEBSIELLA PNEUMONIAE (A)  Final    Report Status 08/11/2022 FINAL  Final   Organism ID, Bacteria KLEBSIELLA PNEUMONIAE (A)  Final      Susceptibility   Klebsiella pneumoniae - MIC*    AMPICILLIN >=32 RESISTANT Resistant     CEFAZOLIN <=4 SENSITIVE Sensitive     CEFEPIME <=0.12 SENSITIVE Sensitive     CEFTRIAXONE <=0.25 SENSITIVE Sensitive     CIPROFLOXACIN <=0.25 SENSITIVE Sensitive     GENTAMICIN <=1 SENSITIVE Sensitive     IMIPENEM <=0.25 SENSITIVE Sensitive     NITROFURANTOIN 64 INTERMEDIATE Intermediate     TRIMETH/SULFA >=320 RESISTANT Resistant     AMPICILLIN/SULBACTAM 8 SENSITIVE Sensitive     PIP/TAZO <=4 SENSITIVE Sensitive     * 20,000 COLONIES/mL KLEBSIELLA PNEUMONIAE  Blood culture (routine x 2)     Status: None   Collection Time: 08/08/22  5:58 PM   Specimen: BLOOD RIGHT HAND  Result Value Ref Range Status   Specimen Description BLOOD RIGHT HAND BOTTLES DRAWN AEROBIC ONLY  Final   Special Requests   Final    Blood Culture results may not be optimal due to an inadequate volume of blood received in culture bottles   Culture   Final    NO GROWTH 5 DAYS Performed at [facility], [address] [place],  [address]    Report Status 08/13/2022 FINAL  Final    Radiology Studies: No results found.  Scheduled Meds:  atorvastatin  40 mg Oral Daily   escitalopram  20 mg Oral Daily   finasteride  5 mg Oral Daily   pantoprazole (PROTONIX) IV  40 mg Intravenous Q12H   pregabalin  75 mg Oral BID   topiramate  25 mg Oral BID   Continuous Infusions:   LOS: 1 day   Time spent: 35 minutes  Makyi Ledo, MD [facility]  If 7PM-7AM, please contact night-coverage [email or web address] 08/14/2022, 12:28 PM

## 2022-08-14 NOTE — Progress Notes (Signed)
Physical Therapy Treatment Patient Details Name: Jared Bridges MRN: [id number] DOB: 08-21-1930 Today's Date: 08/14/2022   History of Present Illness Jared Bridges is a 88 y.o. male with medical history significant of polymyalgia rheumatica, stroke, diabetes, dementia, hypertension, weakness, fatigue.  The patient's daughter is his primary caregiver.  They have been trying to get him in to skilled nursing facility.  He does have some home health care.  Patient has been having low blood pressure today and has been slightly more confused with difficulty with increasing weakness.  He denies any significant pain, shortness of breath, fevers, chills.  It appears that he has been having black tar-like stools for the past several weeks.  His hemoglobin has been dropping over the past couple of months without any investigation.    PT Comments    Patient demonstrates slow labored movement for rolling to side and unable to attempt side lying to sitting due to c/o of back pain and apprehension.  Patient declined to attempt leg exercises due to fatigue.  Patient will benefit from continued skilled physical therapy in hospital and recommended venue below to increase strength, balance, endurance for safe ADLs and gait.     Recommendations for follow up therapy are one component of a multi-disciplinary discharge planning process, led by the attending physician.  Recommendations may be updated based on patient status, additional functional criteria and insurance authorization.  Follow Up Recommendations  Skilled nursing-short term rehab (<3 hours/day) Can patient physically be transported by private vehicle: No   Assistance Recommended at Discharge Intermittent Supervision/Assistance  Patient can return home with the following A lot of help with walking and/or transfers;A lot of help with bathing/dressing/bathroom;Assistance with cooking/housework;Help with stairs or ramp for entrance   Equipment  Recommendations  None recommended by PT    Recommendations for Other Services       Precautions / Restrictions Precautions Precautions: Fall Restrictions Weight Bearing Restrictions: No Other Position/Activity Restrictions: fragile skin BUE     Mobility  Bed Mobility Overal bed mobility: Needs Assistance Bed Mobility: Rolling Rolling: Max assist         General bed mobility comments: slow labored movement and unable to sit up from side lying position due to c/o back pain and apprehension    Transfers                        Ambulation/Gait                   Stairs             Wheelchair Mobility    Modified Rankin (Stroke Patients Only)       Balance                                            Cognition Arousal/Alertness: Awake/alert Behavior During Therapy: WFL for tasks assessed/performed, Anxious Overall Cognitive Status: No family/caregiver present to determine baseline cognitive functioning                                          Exercises      General Comments        Pertinent Vitals/Pain Pain Assessment Pain Assessment: Faces Faces Pain Scale: Hurts little more Pain Location: back  Pain Descriptors / Indicators: Sore Pain Intervention(s): Limited activity within patient's tolerance, Monitored during session, Repositioned    Home Living                          Prior Function            PT Goals (current goals can now be found in the care plan section) Acute Rehab PT Goals Patient Stated Goal: return home with family to assist PT Goal Formulation: With patient Time For Goal Achievement: 08/25/22 Potential to Achieve Goals: Fair Progress towards PT goals: Progressing toward goals    Frequency    Min 2X/week      PT Plan      Co-evaluation              AM-PAC PT "6 Clicks" Mobility   Outcome Measure  Help needed turning from your back to your  side while in a flat bed without using bedrails?: A Lot Help needed moving from lying on your back to sitting on the side of a flat bed without using bedrails?: A Lot Help needed moving to and from a bed to a chair (including a wheelchair)?: Total Help needed standing up from a chair using your arms (e.g., wheelchair or bedside chair)?: Total Help needed to walk in hospital room?: Total Help needed climbing 3-5 steps with a railing? : Total 6 Click Score: 8    End of Session   Activity Tolerance: Patient limited by fatigue;Patient limited by pain;Treatment limited secondary to agitation Patient left: in bed;with call bell/phone within reach;with bed alarm set Nurse Communication: Mobility status PT Visit Diagnosis: Unsteadiness on feet (R26.81);Other abnormalities of gait and mobility (R26.89);Muscle weakness (generalized) (M62.81)     Time: 0156-1537 PT Time Calculation (min) (ACUTE ONLY): 10 min  Charges:  $Therapeutic Activity: 8-22 mins                     3:20 PM, 08/14/22 Lonell Grandchild, MPT Physical Therapist with [facility] [phone] office [phone] mobile phone

## 2022-08-14 NOTE — Addendum Note (Signed)
Addendum  created 08/14/22 1319 by Myna Bright, CRNA   Intraprocedure Staff edited

## 2022-08-14 NOTE — Progress Notes (Signed)
Palliative: [name], Jared Bridges, is resting quietly in bed.  He is resting comfortably, but wakes easily when I call his name.  He does have hearing loss.  He is alert and oriented x3, able to make his basic needs known.  His wife Jared Bridges and daughter Jared Bridges are present at bedside.  We talk in detail about Jared Bridges acute health concerns and the treatment plan.  We discussed some test results, sharing that we are awaiting results from [facility].  We talk about some "what if's and maybe's".  We talk about how to make choices for loved ones including 1) keeping them at the center of decision-making, not what we want for them 2) are we doing something for him or to him, can we change what is happening and 3) the person Jared Bridges was 5 years ago, how would that man tell Jared Bridges to care for him now.  Family endorses that he, to a certain extent, abdicates decision making to her.  We talk about the support that she will receive from outpatient palliative services.  We also talk about the benefits of hospice care for "treat the treatable" care when the time is right.  We also talk about CODE STATUS today and the concept of "treat the treatable, but allowing natural passing".  Family shares that Jared Bridges has a living will.  We talked about asking him what he does and does not want when he is more alert and able to participate in discussions.    Conference with attending, GI, transition of care team related to patient condition, needs, goals of care, disposition.  Plan:   At this point full scope/full code.  Time for outcomes.  Home with home health/VA services.  Outpatient palliative services with hosp[facility] of [facility].      21 minutes  Jared Axe, NP Palliative medicine team Team phone [phone] Greater than 50% of this time was spent counseling and coordinating care related to the above assessment and plan.

## 2022-08-14 NOTE — Telephone Encounter (Signed)
    Patient needs a CBC checked in 1 week.  CBC and iron/tibc/ferritin in 3 months.  Hospital follow up in 4 weeks.   Dx: anemia, gi bleed

## 2022-08-14 NOTE — TOC Progression Note (Signed)
Transition of Care [facility]) - Progression Note    Patient Details  Name: Jared Bridges MRN: [id number] Date of Birth: July 09, 1930  [facility]) CM/SW Contact  [name], LCSW Phone Number: 08/14/2022, 12:41 PM  Clinical Narrative:     TOC following. MD anticipating dc tomorrow to home with resumption of HH. Spoke with dtr to review dc planning. Per dtr, they would like to resume [place] with [facility] and they are interested in the [place] lift. Updated Sarah at [place] and referred to [employer], Thedore Mins, for the [employer].  TOC will follow up in AM.  Expected Discharge Plan: [place] Barriers to Discharge: Continued Medical Work up  Expected Discharge Plan and Services Expected Discharge Plan: [place] In-house Referral: Clinical Social Work Discharge Planning Services: CM Consult Post Acute Care Choice: [place] arrangements for the past 2 months: Single Family Home                 DME Arranged: Other see comment [name]) DME Agency: [name] Date DME Agency Contacted: 08/14/22   Representative spoke with at DME Agency: [place] ([place]) Interventions    Readmission Risk Interventions    06/23/2022    1:26 PM  Readmission Risk Prevention Plan  Transportation Screening Complete  HRI or [place] Complete  Social Work Consult for [place] Planning/Counseling Complete  Palliative Care Screening Not Applicable  Medication Review Press photographer) Complete

## 2022-08-14 NOTE — Op Note (Addendum)
Small Bowel [place] Capsule Study Procedure date:  08/13/22  Referring Provider:  Elon Alas. Abbey Chatters, DO PCP:  Dr. [name], MD  Indication for procedure:  Acute on chronic anemia, possible melena, heme + stool.  EGD and colonoscopy this admission unrevealing. He has small hiatal hernia, normal terminal ileoscopy, four colon polyps removed. Colo-colonic anastomosis healthy appearing.   Patient data:  Wt: 170.40 pounds  Findings:  Small bowel capsule complete, with capsule reaching the cecum. There were intermittent limited views of the small bowel mucosa due to effluent. No evidence of small bowel mass or bleeding during the study.   First Gastric image: 1 minute 6 seconds First Duodenal image: 1 hour 10 minutes 31 seconds First Ileo-Cecal Valve image: 7 hours 58 minutes 2 seconds First Cecal image: 7 hours 58 minutes 32 seconds Gastric Passage time: 1 hour 9 minutes  Small Bowel Passage time: 6 hours 48 minutes   Summary & Recommendations:  Normal appearing small bowel mucosa with some intermittent limited views due to effluent.   Continue oral iron twice daily. Follow hemoglobin.  Check iron stores in 3 months.  Avoid NSAIDS and high dose aspirin. Continue PPI BID.   Laureen Ochs. Bernarda Caffey [facility] [phone] 9/14/20231:14 PM         Gastroenterology attending attestation note: Agree with the below findings as written. The patient was presented to me by the APP (Advanced Practice Provider) I personally reviewed the medical chart and evaluated the patient. I personally evaluated and examined the patient by myself, as well as evaluating the capsule endoscopy.  I agree with Mrs. Laureen Ochs. Lewis's H/P and assessment.  Patient will be continued on oral iron for possible iron malabsorption.  He should have a repeat CBC checked in 1 week but ultimately he will need to have his CBC and iron stores rechecked in 3 months.  [name],  MD Gastroenterology and Hepatology [facility]

## 2022-08-15 ENCOUNTER — Encounter (HOSPITAL_COMMUNITY): Payer: Self-pay | Admitting: Gastroenterology

## 2022-08-15 DIAGNOSIS — D500 Iron deficiency anemia secondary to blood loss (chronic): Secondary | ICD-10-CM | POA: Diagnosis not present

## 2022-08-15 DIAGNOSIS — E1165 Type 2 diabetes mellitus with hyperglycemia: Secondary | ICD-10-CM

## 2022-08-15 DIAGNOSIS — D62 Acute posthemorrhagic anemia: Secondary | ICD-10-CM | POA: Diagnosis not present

## 2022-08-15 DIAGNOSIS — R0603 Acute respiratory distress: Secondary | ICD-10-CM | POA: Diagnosis not present

## 2022-08-15 LAB — CBC
HCT: 27.4 % — ABNORMAL LOW (ref 39.0–52.0)
Hemoglobin: 8.1 g/dL — ABNORMAL LOW (ref 13.0–17.0)
MCH: 27.8 pg (ref 26.0–34.0)
MCHC: 29.6 g/dL — ABNORMAL LOW (ref 30.0–36.0)
MCV: 94.2 fL (ref 80.0–100.0)
Platelets: 219 10*3/uL (ref 150–400)
RBC: 2.91 MIL/uL — ABNORMAL LOW (ref 4.22–5.81)
RDW: 14.7 % (ref 11.5–15.5)
WBC: 6.6 10*3/uL (ref 4.0–10.5)
nRBC: 0 % (ref 0.0–0.2)

## 2022-08-15 LAB — GLUCOSE, CAPILLARY
Glucose-Capillary: 119 mg/dL — ABNORMAL HIGH (ref 70–99)
Glucose-Capillary: 135 mg/dL — ABNORMAL HIGH (ref 70–99)
Glucose-Capillary: 152 mg/dL — ABNORMAL HIGH (ref 70–99)

## 2022-08-15 MED ORDER — FUROSEMIDE 20 MG PO TABS: 20.0000 mg | ORAL_TABLET | ORAL | 0 refills | Status: DC

## 2022-08-15 MED ORDER — PANTOPRAZOLE SODIUM 40 MG PO TBEC: 40.0000 mg | DELAYED_RELEASE_TABLET | Freq: Two times a day (BID) | ORAL | 1 refills | Status: DC

## 2022-08-15 MED ORDER — FERROUS SULFATE 325 (65 FE) MG PO TABS: 325.0000 mg | ORAL_TABLET | Freq: Two times a day (BID) | ORAL | 1 refills | Status: DC

## 2022-08-15 NOTE — Discharge Summary (Signed)
Physician Discharge Summary  Jared Bridges MGQ:[id number] DOB: 05-19-1930 DOA: 08/08/2022  PCP: [name], MD GI: [facility]   Admit date: 08/08/2022 Discharge date: 08/15/2022  Admitted From:  Home  Disposition: Home with [facility] (declined SNF)  Recommendations for Outpatient Follow-up:  Follow up with [facility] in 1 week for CBC check  Please follow up with PCP in 1-2 weeks for recheck  Please follow up with cardiology as scheduled  Home Health: resumption of prior home services  Discharge Condition: STABLE   CODE STATUS: FULL DIET: resume prior home diet    Brief Hospitalization Summary: Please see all hospital notes, images, labs for full details of the hospitalization. ADMISSION HPI:  88 y.o. male with medical history significant of polymyalgia rheumatica, stroke, diabetes, dementia, hypertension, weakness, fatigue.  The patient's daughter is his primary caregiver.  They have been trying to get him in to skilled nursing facility.  He does have some home health care.  Patient has been having low blood pressure today and has been slightly more confused with difficulty with increasing weakness.  He denies any significant pain, shortness of breath, fevers, chills.  It appears that he has been having black tar-like stools for the past several weeks.  His hemoglobin has been dropping over the past couple of months without any investigation.  Hospital Course by problem   Acute blood loss anemia due to suspected GI bleed No abdominal tenderness Status post 2 unit transfusion with stability noted Following CBC and Hg stable. GI planning outpatient CBC in 1 week in office Has iron deficiency anemia that is chronic from chronic prior bleeds Dr. Jenetta Downer recommended iron sulfate 325 mg BID and protonix 40 mg BID   Upper GI bleed GI consulted with EGD 9/9 showing only hiatal hernia and colonoscopy  respiratory status improves and patient can tolerate prep  PPI twice daily GI for  capsule endoscopy was unrevealing.  Discussed with Dr. Jenetta Downer, can advance to reg diet, outpatient follow up with GI clinic arranged, patient stable to discharge home with daughter   Hypertension Continue antihypertensives   Type 2 diabetes mellitus  Resume home treatment plan   History of CVA - stable    Grade 1 diastolic dysfunction - stable    Hypokalemia/Hypomagnesemia - repleted     Acute hypoxemic respiratory failure-treated and resolved -Given Lasix for diuresis 9/10 -Does not appear to have pneumonia    Discharge Diagnoses:  Principal Problem:   Iron deficiency anemia secondary to blood loss (chronic) Active Problems:   Essential hypertension   Iron deficiency anemia due to chronic blood loss   Upper GI bleed   Hypercholesterolemia   Benign prostatic hyperplasia with urinary obstruction   Essential (primary) hypertension   Prostate cancer ([name]   Diabetic peripheral neuropathy ([place])   Obstructive sleep apnea of adult   Controlled type 2 diabetes mellitus with hyperglycemia, without long-term current use of insulin ([name]   Acute blood loss anemia   History of CVA (cerebrovascular accident)   Symptomatic anemia   Grade I diastolic dysfunction   Acute respiratory distress   Atelectasis   Pressure injury of skin   GI bleed   Discharge Instructions:  Allergies as of 08/15/2022       Reactions   Iodine Hives, Itching, Other (See Comments)   very allergic(per daughter), can pre-med with benadryl   Calan [verapamil] Other (See Comments)   Weakness   Cymbalta [duloxetine Hcl] Other (See Comments)   Makes patient have jerking motions.  Iodinated Contrast Media Hives, Itching, Other (See Comments)   Can pre-med with benadryl   Latex Other (See Comments)   Redness iritation   Lisinopril Other (See Comments)   Makes patient have jerking motions.   Neurontin [gabapentin] Other (See Comments)   Makes patient have jerking motions.   Zoloft [sertraline Hcl]  Diarrhea, Other (See Comments)   Makes patient have jerking motions.   Valium [diazepam] Other (See Comments)   "Did not work"   Herbalist Hcl] Other (See Comments)   Dizziness   Glimepiride Other (See Comments)   "Did not work"   Melatonin Nausea Only   Tape Other (See Comments)   Skin irritation        Medication List     STOP taking these medications    amoxicillin-clavulanate 875-125 MG tablet Commonly known as: AUGMENTIN   omeprazole 40 MG capsule Commonly known as: PRILOSEC       TAKE these medications    atorvastatin 40 MG tablet Commonly known as: LIPITOR Take 1 tablet (40 mg total) by mouth daily.   Calcium 200 MG Tabs Take 400 mg by mouth daily.   cyanocobalamin 500 MCG tablet Commonly known as: VITAMIN B12 Take 1,000 mcg by mouth daily.   escitalopram 20 MG tablet Commonly known as: LEXAPRO Take 1 tablet (20 mg total) by mouth daily.   ferrous sulfate 325 (65 FE) MG tablet Take 1 tablet (325 mg total) by mouth 2 (two) times daily with a meal. Mon, wed, fri What changed: when to take this   finasteride 5 MG tablet Commonly known as: PROSCAR Take 1 tablet (5 mg total) by mouth daily.   furosemide 20 MG tablet Commonly known as: Lasix Take 1 tablet (20 mg total) by mouth every other day. Tuesday, Thursday and Saturday   Otezla 30 MG Tabs Generic drug: Apremilast Take 30 mg by mouth 2 (two) times daily.   pantoprazole 40 MG tablet Commonly known as: Protonix Take 1 tablet (40 mg total) by mouth 2 (two) times daily.   pregabalin 75 MG capsule Commonly known as: LYRICA Take 1 capsule (75 mg total) by mouth 2 (two) times daily.   topiramate 50 MG tablet Commonly known as: TOPAMAX Take 0.5 tablets (25 mg total) by mouth 2 (two) times daily.   Vitamin D3 250 MCG (10000 UT) Tabs Take 1,000 Units by mouth daily.   zolpidem 5 MG tablet Commonly known as: AMBIEN Take 5-10 mg by mouth at bedtime as needed for sleep.         Follow-up Information     [facility]. Schedule an appointment as soon as possible for a visit in 1 week(s).   Why: Hospital Follow Up to check CBC Contact information: [address] [place]        [name], MD. Schedule an appointment as soon as possible for a visit in 2 week(s).   Specialty: Internal Medicine Why: Hospital Follow Up Contact information: [place] [address] [phone]         [name], MD .   Specialty: Cardiology Contact information: [place] [address] [phone]                Allergies  Allergen Reactions   Iodine Hives, Itching and Other (See Comments)    very allergic(per daughter), can pre-med with benadryl   Calan [Verapamil] Other (See Comments)    Weakness   Cymbalta [Duloxetine Hcl]  Other (See Comments)    Makes patient have jerking motions.   Iodinated Contrast Media Hives, Itching and Other (See Comments)    Can pre-med with benadryl   Latex Other (See Comments)    Redness iritation   Lisinopril Other (See Comments)    Makes patient have jerking motions.   Neurontin [Gabapentin] Other (See Comments)    Makes patient have jerking motions.   Zoloft [Sertraline Hcl] Diarrhea and Other (See Comments)    Makes patient have jerking motions.   Valium [Diazepam] Other (See Comments)    "Did not work"   Flomax [Tamsulosin Hcl] Other (See Comments)    Dizziness   Glimepiride Other (See Comments)    "Did not work"   Melatonin Nausea Only   Tape Other (See Comments)    Skin irritation   Allergies as of 08/15/2022       Reactions   Iodine Hives, Itching, Other (See Comments)   very allergic(per daughter), can pre-med with benadryl   Calan [verapamil] Other (See Comments)   Weakness   Cymbalta [duloxetine Hcl] Other (See Comments)   Makes patient have jerking motions.   Iodinated Contrast Media Hives,  Itching, Other (See Comments)   Can pre-med with benadryl   Latex Other (See Comments)   Redness iritation   Lisinopril Other (See Comments)   Makes patient have jerking motions.   Neurontin [gabapentin] Other (See Comments)   Makes patient have jerking motions.   Zoloft [sertraline Hcl] Diarrhea, Other (See Comments)   Makes patient have jerking motions.   Valium [diazepam] Other (See Comments)   "Did not work"   Herbalist Hcl] Other (See Comments)   Dizziness   Glimepiride Other (See Comments)   "Did not work"   Melatonin Nausea Only   Tape Other (See Comments)   Skin irritation        Medication List     STOP taking these medications    amoxicillin-clavulanate 875-125 MG tablet Commonly known as: AUGMENTIN   omeprazole 40 MG capsule Commonly known as: PRILOSEC       TAKE these medications    atorvastatin 40 MG tablet Commonly known as: LIPITOR Take 1 tablet (40 mg total) by mouth daily.   Calcium 200 MG Tabs Take 400 mg by mouth daily.   cyanocobalamin 500 MCG tablet Commonly known as: VITAMIN B12 Take 1,000 mcg by mouth daily.   escitalopram 20 MG tablet Commonly known as: LEXAPRO Take 1 tablet (20 mg total) by mouth daily.   ferrous sulfate 325 (65 FE) MG tablet Take 1 tablet (325 mg total) by mouth 2 (two) times daily with a meal. Mon, wed, fri What changed: when to take this   finasteride 5 MG tablet Commonly known as: PROSCAR Take 1 tablet (5 mg total) by mouth daily.   furosemide 20 MG tablet Commonly known as: Lasix Take 1 tablet (20 mg total) by mouth every other day. Tuesday, Thursday and Saturday   Otezla 30 MG Tabs Generic drug: Apremilast Take 30 mg by mouth 2 (two) times daily.   pantoprazole 40 MG tablet Commonly known as: Protonix Take 1 tablet (40 mg total) by mouth 2 (two) times daily.   pregabalin 75 MG capsule Commonly known as: LYRICA Take 1 capsule (75 mg total) by mouth 2 (two) times daily.   topiramate 50  MG tablet Commonly known as: TOPAMAX Take 0.5 tablets (25 mg total) by mouth 2 (two) times daily.   Vitamin D3 250 MCG (10000 UT) Tabs  Take 1,000 Units by mouth daily.   zolpidem 5 MG tablet Commonly known as: AMBIEN Take 5-10 mg by mouth at bedtime as needed for sleep.        Procedures/Studies: DG CHEST PORT 1 VIEW  Result Date: 08/10/2022 CLINICAL DATA:  Dyspnea EXAM: PORTABLE CHEST 1 VIEW COMPARISON:  Radiographs 08/08/2022 FINDINGS: New retrocardiac atelectasis/consolidation. Remainder unchanged. Interstitial coarsening. Stable cardiomediastinal silhouette. Aortic calcification. No definite pleural effusion or pneumothorax. No acute osseous abnormality. IMPRESSION: Retrocardiac atelectasis/consolidation, new from prior. Pneumonia is difficult to exclude. Electronically Signed   By: Placido Sou M.D.   On: 08/10/2022 03:02   DG Chest 2 View  Result Date: 08/08/2022 CLINICAL DATA:  Shortness of breath with fatigue and generalized weakness. EXAM: CHEST - 2 VIEW COMPARISON:  07/13/2022 FINDINGS: Lungs are adequately inflated without focal airspace consolidation. Possible layering small effusion on the lateral film likely left-sided. Cardiomediastinal silhouette and remainder of the exam is unchanged. IMPRESSION: Possible posterior layering small left pleural effusion. Electronically Signed   By: Marin Olp M.D.   On: 08/08/2022 16:26     Subjective: Pt reports no complaints, he really wants to get home today.    Discharge Exam: Vitals:   08/15/22 0542 08/15/22 0543  BP: (!) 172/64 (!) 172/64  Pulse: 68 70  Resp: 16 16  Temp: 97.6 F (36.4 C) 97.6 F (36.4 C)  SpO2: 95% 95%   Vitals:   08/14/22 1145 08/14/22 2117 08/15/22 0542 08/15/22 0543  BP: (!) 123/50 (!) 115/55 (!) 172/64 (!) 172/64  Pulse: (!) 58 66 68 70  Resp: '20 18 16 16  '$ Temp: 97.9 F (36.6 C) 98 F (36.7 C) 97.6 F (36.4 C) 97.6 F (36.4 C)  TempSrc:   Oral Oral  SpO2: 97% 96% 95% 95%  Weight:       Height:       General: Pt is awake, not in acute distress Cardiovascular: normal S1/S2 +, no rubs, no gallops Respiratory: CTA bilaterally, no wheezing, no rhonchi Abdominal: Soft, NT, ND, bowel sounds + Extremities: trace LE edema, no cyanosis   The results of significant diagnostics from this hospitalization (including imaging, microbiology, ancillary and laboratory) are listed below for reference.     Microbiology: Recent Results (from the past 240 hour(s))  Blood culture (routine x 2)     Status: None   Collection Time: 08/08/22  2:09 PM   Specimen: BLOOD RIGHT HAND  Result Value Ref Range Status   Specimen Description BLOOD RIGHT HAND BOTTLES DRAWN AEROBIC ONLY  Final   Special Requests   Final    Blood Culture results may not be optimal due to an inadequate volume of blood received in culture bottles   Culture   Final    NO GROWTH 5 DAYS Performed at [facility], [address] [place], [place] [address]    Report Status 08/13/2022 FINAL  Final  Urine Culture     Status: Abnormal   Collection Time: 08/08/22  2:09 PM   Specimen: Urine, Clean Catch  Result Value Ref Range Status   Specimen Description   Final    URINE, CLEAN CATCH Performed at [facility], [address] [place], [place] [address]    Special Requests   Final    NONE Performed at [facility], [address], [place], [place] [address]    Culture 20,000 COLONIES/mL KLEBSIELLA PNEUMONIAE (A)  Final   Report Status 08/11/2022 FINAL  Final   Organism ID, Bacteria KLEBSIELLA PNEUMONIAE (A)  Final      Susceptibility   Klebsiella pneumoniae - MIC*    AMPICILLIN >=32 RESISTANT Resistant     CEFAZOLIN <=4 SENSITIVE Sensitive     CEFEPIME <=0.12 SENSITIVE Sensitive     CEFTRIAXONE <=0.25 SENSITIVE Sensitive     CIPROFLOXACIN <=0.25 SENSITIVE Sensitive     GENTAMICIN <=1 SENSITIVE Sensitive     IMIPENEM <=0.25 SENSITIVE Sensitive     NITROFURANTOIN 64 INTERMEDIATE Intermediate     TRIMETH/SULFA >=320  RESISTANT Resistant     AMPICILLIN/SULBACTAM 8 SENSITIVE Sensitive     PIP/TAZO <=4 SENSITIVE Sensitive     * 20,000 COLONIES/mL KLEBSIELLA PNEUMONIAE  Blood culture (routine x 2)     Status: None   Collection Time: 08/08/22  5:58 PM   Specimen: BLOOD RIGHT HAND  Result Value Ref Range Status   Specimen Description BLOOD RIGHT HAND BOTTLES DRAWN AEROBIC ONLY  Final   Special Requests   Final    Blood Culture results may not be optimal due to an inadequate volume of blood received in culture bottles   Culture   Final    NO GROWTH 5 DAYS Performed at [facility], [address] [place], [place] [address]    Report Status 08/13/2022 FINAL  Final     Labs: BNP (last 3 results) Recent Labs    07/13/22 1325 08/08/22 1532 08/10/22 0512  BNP 58.0 96.0 009.3*   Basic Metabolic Panel: Recent Labs  Lab 08/10/22 0512 08/11/22 0454 08/12/22 0333 08/13/22 0353 08/14/22 0352  NA 141 140 142 137 138  K 3.4* 3.2* 4.1 3.2* 3.5  CL 109 108 109 105 107  CO2 '27 28 29 27 25  '$ GLUCOSE 114* 98 91 112* 137*  BUN 11 8 7* 8 9  CREATININE 0.72 0.76 0.85 0.87 0.90  CALCIUM 8.7* 8.7* 9.2 8.5* 8.5*  MG 1.8 1.7 1.8 1.6* 2.2   Liver Function Tests: Recent Labs  Lab 08/08/22 1532  AST 10*  ALT 8  ALKPHOS 53  BILITOT 0.6  PROT 6.5  ALBUMIN 3.0*   No results for input(s): "LIPASE", "AMYLASE" in the last 168 hours. No results for input(s): "AMMONIA" in the last 168 hours. CBC: Recent Labs  Lab 08/11/22 0454 08/12/22 0333 08/13/22 0353 08/14/22 0352 08/15/22 0403  WBC 7.0 6.7 7.5 6.1 6.6  HGB 7.7* 8.4* 8.2* 8.3* 8.1*  HCT 25.7* 28.8* 27.5* 27.3* 27.4*  MCV 95.2 95.7 94.2 94.5 94.2  PLT 231 234 244 228 219   Cardiac Enzymes: No results for input(s): "CKTOTAL", "CKMB", "CKMBINDEX", "TROPONINI" in the last 168 hours. BNP: Invalid input(s): "POCBNP" CBG: Recent Labs  Lab 08/08/22 2140 08/09/22 1134 08/14/22 1605 08/15/22 0545 08/15/22 0729  GLUCAP 100* 93 129* 119* 135*    D-Dimer No results for input(s): "DDIMER" in the last 72 hours. Hgb A1c Recent Labs    08/14/22 0352  HGBA1C 4.8   Lipid Profile No results for input(s): "CHOL", "HDL", "LDLCALC", "TRIG", "CHOLHDL", "LDLDIRECT" in the last 72 hours. Thyroid function studies No results for input(s): "TSH", "T4TOTAL", "T3FREE", "THYROIDAB" in the last 72 hours.  Invalid input(s): "FREET3" Anemia work up No results for input(s): "VITAMINB12", "FOLATE", "FERRITIN", "TIBC", "IRON", "RETICCTPCT" in the last 72 hours. Urinalysis    Component Value Date/Time   COLORURINE YELLOW 08/08/2022 1409   APPEARANCEUR CLEAR 08/08/2022 1409   LABSPEC 1.015 08/08/2022 1409   PHURINE 5.0 08/08/2022 1409   GLUCOSEU NEGATIVE 08/08/2022 1409   HGBUR NEGATIVE 08/08/2022 1409   BILIRUBINUR NEGATIVE 08/08/2022 1409  BILIRUBINUR negative 04/19/2019 1354   KETONESUR NEGATIVE 08/08/2022 1409   PROTEINUR NEGATIVE 08/08/2022 1409   UROBILINOGEN 0.2 04/19/2019 1354   UROBILINOGEN 1.0 05/21/2015 1050   NITRITE NEGATIVE 08/08/2022 1409   LEUKOCYTESUR NEGATIVE 08/08/2022 1409   Sepsis Labs Recent Labs  Lab 08/12/22 0333 08/13/22 0353 08/14/22 0352 08/15/22 0403  WBC 6.7 7.5 6.1 6.6   Microbiology Recent Results (from the past 240 hour(s))  Blood culture (routine x 2)     Status: None   Collection Time: 08/08/22  2:09 PM   Specimen: BLOOD RIGHT HAND  Result Value Ref Range Status   Specimen Description BLOOD RIGHT HAND BOTTLES DRAWN AEROBIC ONLY  Final   Special Requests   Final    Blood Culture results may not be optimal due to an inadequate volume of blood received in culture bottles   Culture   Final    NO GROWTH 5 DAYS Performed at [facility], [address], [place], [place] [address]    Report Status 08/13/2022 FINAL  Final  Urine Culture     Status: Abnormal   Collection Time: 08/08/22  2:09 PM   Specimen: Urine, Clean Catch  Result Value Ref Range Status   Specimen Description   Final    URINE,  CLEAN CATCH Performed at [facility], [address] [place], [place] [address]    Special Requests   Final    NONE Performed at [facility], [address], [place], [place] [address]    Culture 20,000 COLONIES/mL KLEBSIELLA PNEUMONIAE (A)  Final   Report Status 08/11/2022 FINAL  Final   Organism ID, Bacteria KLEBSIELLA PNEUMONIAE (A)  Final      Susceptibility   Klebsiella pneumoniae - MIC*    AMPICILLIN >=32 RESISTANT Resistant     CEFAZOLIN <=4 SENSITIVE Sensitive     CEFEPIME <=0.12 SENSITIVE Sensitive     CEFTRIAXONE <=0.25 SENSITIVE Sensitive     CIPROFLOXACIN <=0.25 SENSITIVE Sensitive     GENTAMICIN <=1 SENSITIVE Sensitive     IMIPENEM <=0.25 SENSITIVE Sensitive     NITROFURANTOIN 64 INTERMEDIATE Intermediate     TRIMETH/SULFA >=320 RESISTANT Resistant     AMPICILLIN/SULBACTAM 8 SENSITIVE Sensitive     PIP/TAZO <=4 SENSITIVE Sensitive     * 20,000 COLONIES/mL KLEBSIELLA PNEUMONIAE  Blood culture (routine x 2)     Status: None   Collection Time: 08/08/22  5:58 PM   Specimen: BLOOD RIGHT HAND  Result Value Ref Range Status   Specimen Description BLOOD RIGHT HAND BOTTLES DRAWN AEROBIC ONLY  Final   Special Requests   Final    Blood Culture results may not be optimal due to an inadequate volume of blood received in culture bottles   Culture   Final    NO GROWTH 5 DAYS Performed at [facility], [address] [place], [place] [address]    Report Status 08/13/2022 FINAL  Final    Time coordinating discharge: 50 mins   SIGNED:  Irwin Brakeman, MD  [facility] 08/15/2022, 9:49 AM How to contact the [facility] Attending or Consulting provider [place] or covering provider during after hours [place], for this patient?  Check the care team in [facility] and look for a) attending/consulting TRH provider listed and b) the [facility] team listed Log into [email or web address] and use [place]'s universal password to access. If you do not have the password, please contact the hospital  operator. Locate the [facility] provider you are looking for under [facility] and page  to a number that you can be directly reached. If you still have difficulty reaching the provider, please page the [facility] (Director on Call) for the Hospitalists listed on amion for assistance.

## 2022-08-15 NOTE — Telephone Encounter (Signed)
CBC,FE/TIBC,Ferr in three months per Neil Crouch order from 08/15/2022. Around 11/13/2022. Per Vicente Males at [facility] [phone]. They have it down to do the labs for [name] around 11/13/2022,but if they are no longer following patient we will need to reach back out to pt dtr Jared Bridges to see how to get lab drawn. They will proceed with the Cbc that is due on 08/21/2022 and will fax the result to my atten at [phone]. I have the December labs in my reminders to reach out to [facility] or the patient daughter Jared Bridges to see if the patient is still being followed by [facility] and if not we will have to find out how patient is going to have labs drawn as per patient daughter Jared Bridges the patient is immobile.

## 2022-08-15 NOTE — Care Management Important Message (Signed)
Important Message  Patient Details  Name: Jared Bridges MRN: [id number] Date of Birth: 1930-03-19   Medicare Important Message Given:  Yes     [name] 08/15/2022, 10:40 AM

## 2022-08-15 NOTE — Telephone Encounter (Signed)
Mitzie please set up four week hospital follow up. Thanks.  I spoke with [name] patient daughter in regards to blood work next week ,then again in three months. She says patient has a hard time getting around and the patient has a home health agencey that does take care of him, by the name of [facility]. She gave the name of the nurse as Lorre Nick and his phone number [phone].I have left a message for Kendal and asked that he please return call to the office. I also made [name] patient daughter aware of the the follow up here in the office in four weeks, that someone from the office would be in contact with her to arrange.

## 2022-08-15 NOTE — TOC Transition Note (Signed)
Transition of Care [facility]) - CM/SW Discharge Note   Patient Details  Name: Jared Bridges MRN: [id number] Date of Birth: 1930-04-20  Transition of Care [facility]) CM/SW Contact:  [name], LCSW Phone Number: 08/15/2022, 10:58 AM   Clinical Narrative:     Pt stable for dc today per MD. Damaris Schooner with pt's daughter who remains in agreement with plan for dc home with resumption of HH from [place]. Pt will transport with EMS.   Updated Sarah at [place]. Also, Thedore Mins at [employer] that they [employer] will be delivered today.  There are no other TOC needs for dc.  Final next level of care: [place] Barriers to Discharge: Barriers Resolved   Patient Goals and CMS Choice Patient states their goals for this hospitalization and ongoing recovery are:: home with [facility] CMS Medicare.gov Compare Post Acute Care list provided to:: Patient Represenative (must comment) Choice offered to / list presented to : Adult Children  Discharge Placement                       Discharge Plan and Services In-house Referral: Clinical Social Work Discharge Planning Services: CM Consult Post Acute Care Choice: Home Health          DME Arranged: Other see comment [name]) DME Agency: [name] Date DME Agency Contacted: 08/14/22   Representative spoke with at DME Agency: [place] ([place]) Interventions     Readmission Risk Interventions    06/23/2022    1:26 PM  Readmission Risk Prevention Plan  Transportation Screening Complete  HRI or [place] Complete  Social Work Consult for [place] Planning/Counseling Complete  Palliative Care Screening Not Applicable  Medication Review Press photographer) Complete

## 2022-08-15 NOTE — Discharge Instructions (Signed)
IMPORTANT INFORMATION: PAY CLOSE ATTENTION   PHYSICIAN DISCHARGE INSTRUCTIONS  Follow with Primary care provider  [name], MD  and other consultants as instructed by your Hospitalist Physician  [place] IF SYMPTOMS COME BACK, WORSEN OR NEW PROBLEM DEVELOPS   Please note: You were cared for by a hospitalist during your hospital stay. Every effort will be made to forward records to your primary care provider.  You can request that your primary care provider send for your hospital records if they have not received them.  Once you are discharged, your primary care physician will handle any further medical issues. Please note that NO REFILLS for any discharge medications will be authorized once you are discharged, as it is imperative that you return to your primary care physician (or establish a relationship with a primary care physician if you do not have one) for your post hospital discharge needs so that they can reassess your need for medications and monitor your lab values.  Please get a complete blood count and chemistry panel checked by your Primary MD at your next visit, and again as instructed by your Primary MD.  Get Medicines reviewed and adjusted: Please take all your medications with you for your next visit with your Primary MD  Laboratory/radiological data: Please request your Primary MD to go over all hospital tests and procedure/radiological results at the follow up, please ask your primary care provider to get all Hospital records sent to his/her office.  In some cases, they will be blood work, cultures and biopsy results pending at the time of your discharge. Please request that your primary care provider follow up on these results.  If you are diabetic, please bring your blood sugar readings with you to your follow up appointment with primary care.    Please call and make your follow up appointments as soon as possible.    Also Note the  following: If you experience worsening of your admission symptoms, develop shortness of breath, life threatening emergency, suicidal or homicidal thoughts you must seek medical attention immediately by calling 911 or calling your MD immediately  if symptoms less severe.  You must read complete instructions/literature along with all the possible adverse reactions/side effects for all the Medicines you take and that have been prescribed to you. Take any new Medicines after you have completely understood and accpet all the possible adverse reactions/side effects.   Do not drive when taking Pain medications or sleeping medications (Benzodiazepines)  Do not take more than prescribed Pain, Sleep and Anxiety Medications. It is not advisable to combine anxiety,sleep and pain medications without talking with your primary care practitioner  Special Instructions: If you have smoked or chewed Tobacco  in the last 2 yrs please stop smoking, stop any regular Alcohol  and or any Recreational drug use.  Wear Seat belts while driving.  Do not drive if taking any narcotic, mind altering or controlled substances or recreational drugs or alcohol.

## 2022-08-18 ENCOUNTER — Encounter (HOSPITAL_COMMUNITY): Payer: Self-pay | Admitting: Gastroenterology

## 2022-08-18 NOTE — TOC Progression Note (Signed)
Transition of Care [facility]) - Progression Note    Patient Details  Name: Jared Bridges MRN: [id number] Date of Birth: [birth year]  [facility] CM/SW Contact  [name], LCSW Phone Number: 08/18/2022, 4:30 PM  Clinical Narrative:     Received call from pt's daughter inquiring about Palliative Care as an outpatient for her father. Reviewed record and do not see that a referral was made. TOC made this referral and provided phone number for hospice to daughter.   Expected Discharge Plan: [name] Barriers to Discharge: Barriers Resolved  Expected Discharge Plan and Services Expected Discharge Plan: [name] In-house Referral: Clinical Social Work Discharge Planning Services: CM Consult Post Acute Care Choice: [name] arrangements for the past 2 months: Single Family Home Expected Discharge Date: 08/15/22               DME Arranged: Other see comment [name]) DME Agency: [name] Date DME Agency Contacted: 08/14/22   Representative spoke with at DME Agency: [name] Interventions    Readmission Risk Interventions    06/23/2022    1:26 PM  Readmission Risk Prevention Plan  Transportation Screening Complete  HRI or [place] Complete  Social Work Consult for [name] Planning/Counseling Complete  Palliative Care Screening Not Applicable  Medication Review Press photographer) Complete

## 2022-08-19 ENCOUNTER — Encounter (HOSPITAL_COMMUNITY): Payer: Self-pay | Admitting: Gastroenterology

## 2022-08-22 NOTE — Telephone Encounter (Signed)
I spoke with Vicente Males at [facility] and asked that she fax the lab results to [name] at [phone]. She says she has to call the nurse Kendal whom saw patient on 08/21/2022 if it was drawn and if it was she will fax the lab orders over to our office.

## 2022-08-22 NOTE — Telephone Encounter (Signed)
Per Vicente Males with [facility] they missed the order when the patient was seen yesterday, they will send someone out to the lab work today.

## 2022-08-25 ENCOUNTER — Encounter (INDEPENDENT_AMBULATORY_CARE_PROVIDER_SITE_OTHER): Payer: Self-pay

## 2022-08-25 NOTE — Telephone Encounter (Signed)
Hi Leslie, you ordered a cbc on patient in one week from the hospitalization. They [facility]) did not draw this on patient as instructed (should have been done 08/21/2022) and ended up sending someone to the patient home on 08/22/2022 to draw it, they seemed to have gotten the orders messed up further by ordering the fe studies too early. Since no providers are here and you are familiar with this patient I was hoping to run the results by you.   CBC: Wbc 6.3 Rbc 3.08 Hgb 8.5     Last hgb 8.3 on 08/14/2022 Hct 27.7    Last hct 27.3 on 08/14/2022 Mcv 90 Mch 27.6 Mchc 30.7 Rdw 14.1 Plts 221  Fe and Tibc Tibc 220  Uibc 201 Iron 19 Fe Sat % 9   Ferritin 60  Patient is on po Ferrous Sulfate 325 Bid, and patient has follow up appt 03/09/2023, we are awaiting a hospital follow appt here, but patient is not mobile he is bed ridden and has home health coming in for pt and has not walked in over a year. ( I will have Ann Scan the labs into Epic). Please advise.

## 2022-08-25 NOTE — Telephone Encounter (Signed)
I spoke with Jared Bridges at [facility] and she was advise to please have a nurse go out to the patient home the week of September 15, 2022 and draw a cbc at that time and fax the results to [name] at [phone]. I also advised that they had drawn the patient fe studies to early and we would be still needing those in three months. I have placed a reminder in my file to check to see if patient is being followed by them at that time. The daughter Jared Bridges is also aware of the need of labs in three week and again in three months and aware I will reach out to her at the time to see if patient is still being followed by [facility] or if we will have to find another way for the patient to get done.

## 2022-08-25 NOTE — Telephone Encounter (Signed)
I spoke with the patient daughter [name] she is aware to keep patient on Ferrous Sulfate bid and aware I will reach out to [facility] to set up lab drawl in three weeks.

## 2022-08-25 NOTE — Telephone Encounter (Signed)
  Hgb stable. Iron/iron sats little low. Recommend he continue oral ferrous sulfate '325mg'$  BID.   Let's repeat CBC in 3 weeks. We can arrange for future iron/tibc/ferritin later as needed.

## 2022-09-19 NOTE — Telephone Encounter (Signed)
I spoke with Jared Bridges she says she will send the results over the fax today.

## 2022-09-23 ENCOUNTER — Encounter (INDEPENDENT_AMBULATORY_CARE_PROVIDER_SITE_OTHER): Payer: Self-pay

## 2022-09-23 NOTE — Telephone Encounter (Signed)
I spoke with Jared Bridges again at [facility] I asked that she please refax as I did not get the results from last week.

## 2022-09-23 NOTE — Telephone Encounter (Signed)
Jared Bridges, I finally was able to get the results of the most recent CBC you ordered for last week (10/16/20223)   (per daughter)           September 15, 2022   August 22, 2022        August 14, 2022  September 19, 2022    Hgb 8.7 at [facility]       HGB 7.8                    HGB 8.5                                    Hgb 8.3 Should be in               Hct 26.3                     Hct 27.7                                    Hct 27.3           Epic                      I spoke with the patient daughter Jared Bridges today and patient is asymptomatic and is feeling well. Denies any shortness of breath fatigue, dizziness, or faint feelings.  He is up in his motorized wheel chair. Patient was seen at [facility] last Friday and per patient daughter they thought he was doing well. I have asked that Jared Bridges please scan in the cbc dated 09/15/2022 that you had ordered. Per daughter blood from [facility] was venous drawn.

## 2022-09-23 NOTE — Telephone Encounter (Signed)
Looks stable. Continue oral iron and pantoprazole. Avoid NSAIDs.  Recheck CBC, iron/tibc/ferritin in 8 weeks.

## 2022-09-23 NOTE — Telephone Encounter (Signed)
Some how the number below got distorted.( Will try this again)  08/14/2022 hgb 8.3  08/22/2022 hgb 8.5  09/15/2022 hgb 7.8  09/19/2022 hgb at [facility] 8.7 this was a venous draw.

## 2022-09-24 NOTE — Telephone Encounter (Signed)
I have spoke with the daughter and made her aware to continue po Fe and pantoprazole and to Avoid NSAIDs.

## 2022-09-24 NOTE — Telephone Encounter (Signed)
I called and left the patient daughter [name] a message to return call.

## 2022-09-24 NOTE — Telephone Encounter (Signed)
Patient already has the labs schedule to be drawn around 11/13/2022 with [facility]. Which will be 7 weeks out is this ok or do I need to change it to the following week? Please see below the note in my reminders for the week of 11/13/2022.   CBC,FE/TIBC,Ferr in three months per Neil Crouch order from 08/15/2022. Around 11/13/2022. Per Vicente Males at [facility] [phone]. They have it down to do the labs for [name] around 11/13/2022,but if they are no longer following patient we will need to reach back out to pt dtr [name] to see how to get lab drawn.

## 2022-09-24 NOTE — Telephone Encounter (Signed)
Crystal, labs as already arranged will be perfect. Thanks.

## 2022-11-06 ENCOUNTER — Telehealth (INDEPENDENT_AMBULATORY_CARE_PROVIDER_SITE_OTHER): Payer: Self-pay | Admitting: *Deleted

## 2022-11-06 NOTE — Telephone Encounter (Signed)
Daughter meshach perry called to clarify how patient should be taking iron. He went to [place] and daughter told provider there he was taking one bid and he told her that sounded like too much. She went home and looked at meds and he is actually taking one bid on Monday Wednesdays and Fridays. She reports last hg done in October at [facility] was 8.7. and wonders if he should continue the way he is taking one bid on mon, wed, and frid. He had givens on 08/13/22 and next appt is 03/09/23

## 2022-11-06 NOTE — Telephone Encounter (Signed)
Daughter verbalized understanding.

## 2022-11-06 NOTE — Telephone Encounter (Signed)
Discussed with patient's daughter to take twice daily every day and she verbalized understanding.

## 2022-11-06 NOTE — Telephone Encounter (Signed)
He should continue taking the oral iron supplementation twice a day. This should not be every other day but every day.

## 2022-11-14 ENCOUNTER — Encounter (INDEPENDENT_AMBULATORY_CARE_PROVIDER_SITE_OTHER): Payer: Self-pay

## 2022-11-14 ENCOUNTER — Telehealth (INDEPENDENT_AMBULATORY_CARE_PROVIDER_SITE_OTHER): Payer: Self-pay

## 2022-11-14 NOTE — Telephone Encounter (Signed)
Hello Magda Paganini, I got the patient results from 11/11/2022 from [facility] home health you had ordered these back on 08/14/2022. I called the patient daughter [name] today to find out if the patient was symptomatic and she says no. Patient denies any shortness of breath,dizziness,or fatigue. Patient Hgb on 11/11/2022 was 9.7 up from 8.7 on 09/19/2022. Fe sat is 13 %. Per [name] she has been giving the patient the ferrous sulfate 325 bid daily, but patient pcp Dr. Volanda Napoleon had suggested that the patient just take the ferrous sulfate twice a day on M,W, F. [name] wanted me to check with you to see what you suggest. I have had Lelon Frohlich Scan these labs into Epic for you to review. Please advise what to tell the daughter regarding po fe. Thanks.

## 2022-11-19 ENCOUNTER — Other Ambulatory Visit (INDEPENDENT_AMBULATORY_CARE_PROVIDER_SITE_OTHER): Payer: Self-pay

## 2022-11-19 ENCOUNTER — Telehealth (INDEPENDENT_AMBULATORY_CARE_PROVIDER_SITE_OTHER): Payer: Self-pay

## 2022-11-19 DIAGNOSIS — I10 Essential (primary) hypertension: Secondary | ICD-10-CM

## 2022-11-19 DIAGNOSIS — K922 Gastrointestinal hemorrhage, unspecified: Secondary | ICD-10-CM

## 2022-11-19 DIAGNOSIS — D500 Iron deficiency anemia secondary to blood loss (chronic): Secondary | ICD-10-CM

## 2022-11-19 DIAGNOSIS — Z8711 Personal history of peptic ulcer disease: Secondary | ICD-10-CM

## 2022-11-19 NOTE — Telephone Encounter (Signed)
I have mailed the orders for patient daughter to take with them to [facility] 01/09/2023. I have asked that she please have them fax results to [phone], if they do not we will need to check care every where. I have placed this in my reminders for 01/12/2023.

## 2022-11-19 NOTE — Telephone Encounter (Signed)
Per Mitzie the patient will be seen here tomorrow by Vikki Ports, via My Chart.

## 2022-11-19 NOTE — Telephone Encounter (Signed)
Is he able to come in and see Dr. Jenetta Downer? Previously we had recommended him coming in for 4 week follow up but bedridden at the time.   She can try and get labs drawn by another provider ([facility]) but if he does have them done that way, have them make sure to let us know when drawn so we can make sure we receive the results.   I personally have never seen the patient. I had to cover when [place] and Dr. Jenetta Downer were out of the office. I would prefer one of them to advise regarding appetite concerns.

## 2022-11-19 NOTE — Telephone Encounter (Signed)
I spoke with Jared Bridges patient daughter made her aware that patient should be taking po Fe bid. She is aware we need repeat lab in three months. She says the Home health has discharged him and will no longer go out to draw his blood. She says they have an appointment at [facility] on 01/09/2022 and wanted to know if she could just take the lab order with them at that time and have them draw it?   She also wanted to mention that since her father had sepsis his appetite has decreased significantly, and he is not wanting to get up in the mornings. Advised the fatigue could be related to the anemia, but would mention this and the decreased appetite to you. She says he does well with liquid intake. Please advise of any suggestions,??? Ensures,boost?? Thanks

## 2022-11-19 NOTE — Telephone Encounter (Signed)
Ok to have them checked in February.  Regarding his appetite, it is hard to tell what is driving this, seems multifactorial especially after all the events he has had for the last few years. There are very few medications we actually prescribe for appetite changes. Has he seen his PCP? May be a better starting point instead of bringing him to the office. Agree with Boost/Ensure for now.

## 2022-11-19 NOTE — Telephone Encounter (Signed)
Dr. Jenetta Downer, would you mind looking at this message as per Magda Paganini the patient needs labs in three months and they want to have it drawn in Feb 2024 while at [facility] the daughter would be certain to make sure we get a copy. Plus should be in Care everywhere. Also patient daughter states the patient appetite has decrease in the last feel days. Advise supplementation with boost or ensure. Please advise.  I spoke with patient daughter she states the patient had been getting up in his hover round and smoking his cigars, until a few days ago when he did not want to get out of the bed, stating the weather is not nice enough for me to get up. I did advise if not eating to supplement with boost or ensures. Daughter states understanding, she says if he absolutely had to come in to the office she would get him up,but it is hard.

## 2022-11-19 NOTE — Telephone Encounter (Signed)
Please see telephone note from 11/06/22. Agree with Dr. Jenetta Downer, patient should be taking oral iron twice daily every day. His Hgb has improved but still low. Iron sats/serum iron slightly better.   I would suggest rechecking CBC, iron/tibc/ferritin in 3 months.

## 2022-11-19 NOTE — Telephone Encounter (Signed)
I spoke with the patient daughter she says the patient has seen the PCP on 10/16/2022 and they just listened and never made any suggestions. Jared Bridges says her father was excited about starting the boost shakes . She says the PCP at one point wanted to start the patient on Remeron,but the patient refused as they were going to exchange his Ambien for this and he said he did not want to do this as he did not sleep well. If anything is called in they want sent to [facility].

## 2022-11-19 NOTE — Telephone Encounter (Signed)
Remeron would be the medication that we could consider, if he/she want to possibly discuss this medication he will need to have an appointment in clinic. If he is excited about the Boost shakes, he can start them ASAP, up to 3 per day

## 2022-11-19 NOTE — Telephone Encounter (Signed)
Tried [employer], no answer.

## 2022-11-19 NOTE — Telephone Encounter (Signed)
Spoke with the patient daughter made her aware that the patient may have up to three boost drinks per day. She was transferred to [facility] to set up an appointment. We have an opening tomorrow virtually, Mitzi to ask if patient interested.

## 2022-11-20 ENCOUNTER — Telehealth (INDEPENDENT_AMBULATORY_CARE_PROVIDER_SITE_OTHER): Payer: Medicare Other | Admitting: Gastroenterology

## 2022-11-20 ENCOUNTER — Encounter (INDEPENDENT_AMBULATORY_CARE_PROVIDER_SITE_OTHER): Payer: Self-pay | Admitting: Gastroenterology

## 2022-11-20 VITALS — Ht 74.0 in | Wt 170.0 lb

## 2022-11-20 DIAGNOSIS — R630 Anorexia: Secondary | ICD-10-CM | POA: Diagnosis not present

## 2022-11-20 DIAGNOSIS — R634 Abnormal weight loss: Secondary | ICD-10-CM

## 2022-11-20 DIAGNOSIS — R197 Diarrhea, unspecified: Secondary | ICD-10-CM

## 2022-11-20 MED ORDER — MIRTAZAPINE 15 MG PO TABS: 15.0000 mg | ORAL_TABLET | Freq: Every day | ORAL | 1 refills | Status: DC

## 2022-11-20 NOTE — Progress Notes (Signed)
Primary Care Physician:  [name], MD  Primary GI: Jenetta Downer   Patient Location: Home   Provider Location: [facility] office   Reason for Visit: decreased appetite and diarrhea   Persons present on the virtual encounter, with roles: Jared Encalade L. Alver Sorrow, MSN, APRN, AGNP-C, [name], patient and Deirdre Pippins, Patient's daughter   Total time (minutes) spent on medical discussion: 25 minutes  Virtual Visit via MyChart Video Note visit is conducted virtually and was requested by patient.   I connected with [name] on 11/20/22 at  1:45 PM EST by MyChart Video and verified that I am speaking with the correct person using two identifiers.   I discussed the limitations, risks, security and privacy concerns of performing an evaluation and management service by telephone and the availability of in person appointments. I also discussed with the patient that there may be a patient responsible charge related to this service. The patient expressed understanding and agreed to proceed.  Chief Complaint  Patient presents with   decreased appetite    Patient being seen today for a decreased appetite. Started yesterday boost + and paitnet seems to enjoy this. Per Dr. Jenetta Downer can take up to three per day. Wants to discuss starting a medication to help with appetite.   History of Present Illness: Jared Bridges is a 88 y.o. male with a history of depression, diverticulitis, history of gastric ulcers, PMR, stroke, TIA, diabetes   Recent hostpialization in September for anemia, hgb 7.1 on presentation with heme positive stool. EGD on 9/9 overall unrevealing, colonoscopy done thereater also unrevealing. Givens capsule study on 9/14 with normal appearing small bowel. Advised to continue PO iron BID, check iron stores in 3 months, continue PPI BID and follow hgb closely  Patient's daughter had contacted the office stating patients appetite had not been good, recommended tos tart BOOST  shakes TID by Dr. Jenetta Downer and potentially start Remeron to help with appeite.   Hgb 9.7 on 12/12, Iron 23, TIBC 171, iron sat 13% ferritin 51.5  Present:  Patient presents with his daughter Jared Bridges who helps provide history. She states that weight previously was 190 lbs, he is currently around 170 lbs. Appetite is low. He is having diarrhea daily. She has given him imodium 1-2x/week. She reports that diarrhea has been occurring since he had sepsis a while back. Last antibiotic therapy was in August. She reports that stools are loose, sometimes with some solid pieces mixed in and sometimes watery. No blood in stools, they are dark on PO iron. As he wears a brief for stool incontinence, she is unsure how  many stools he is having per day.  No abdominal pain, nausea or vomiting.   Daughter states on average, He is eating a donut and a banana in the morning, half a sandwich and some chips at lunch and a lot of times does not want to eat dinner.  Daughter reports that it was recommended that patient stop ambien in the past and start remeron to help with sleep and mood as well, however, patient declined as he did not want to stop his [place]. However, daughter Jared Bridges is interested in potentially starting remeron for his appetite in place of the [place] as she is concerned about him not eating much and losing weight.    Past Medical History:  Diagnosis Date   Anemia, iron deficiency    Chronic headaches    Migraines   Chronic kidney disease    Resolved  Depression    Diverticulitis    Essential hypertension    GI bleed    Dr. Laural Golden - 1998   Hypercholesteremia    Orthostatic hypotension    Peptic ulcer disease    Peripheral neuropathy    Polymyalgia rheumatica ([name]    Stroke ([place])    Residual trouble reading and writing   Syncope    Thyroid nodule    TIA (transient ischemic attack) 12/2017   Type 2 diabetes mellitus ([place])      Past Surgical History:  Procedure Laterality Date    APPENDECTOMY     as child   BACK SURGERY     2000 IDET SPINAL PROC   CHOLECYSTECTOMY N/A 04/07/2016   Procedure: CHOLECYSTECTOMY;  Surgeon: Aviva Signs, MD;  Location: [facility];  Service: General;  Laterality: N/A;   [place]   COLONOSCOPY N/A 01/07/2018   Procedure: COLONOSCOPY;  Surgeon: Rogene Houston, MD;  [facility];  Service: Endoscopy;  Laterality: N/A;  1:00   COLONOSCOPY WITH PROPOFOL N/A 08/12/2022   Procedure: COLONOSCOPY WITH PROPOFOL;  Surgeon: Harvel Quale, MD;  [facility]: [facility];  Service: Gastroenterology;  Laterality: N/A;  ASA 3   CRYOTHERAPY     ESOPHAGOGASTRODUODENOSCOPY (EGD) WITH PROPOFOL N/A 08/09/2022   Procedure: ESOPHAGOGASTRODUODENOSCOPY (EGD) WITH PROPOFOL;  Surgeon: Harvel Quale, MD;  [facility];  Service: Gastroenterology;  Laterality: N/A;   EYE SURGERY     right cataract with lens implant   FEMUR SURGERY     RT [place] STUDY N/A 08/13/2022   Procedure: GIVENS CAPSULE STUDY;  Surgeon: Harvel Quale, MD;  [facility]: [facility];  Service: Gastroenterology;  Laterality: N/A;   HAND SURGERY     RIGHT   HERNIA REPAIR     1-right inguinal, 3- left inguinal   IRRIGATION AND DEBRIDEMENT KNEE Left 07/09/2015   Procedure: LEFT KNEE IRRIGATION AND DEBRIDEMENT WOUND CLOSURE;  Surgeon: Gaynelle Arabian, MD;  Location: [facility];  Service: Orthopedics;  Laterality: Left;   JOINT REPLACEMENT  02/28/2016   lt knee revision   KNEE DEBRIDEMENT     2016   LEFT   MENISCUS REPAIR     LEFT   POLYPECTOMY  01/07/2018   Procedure: POLYPECTOMY;  Surgeon: Rogene Houston, MD;  [facility];  Service: Endoscopy;;  colon    POLYPECTOMY  08/12/2022   Procedure: POLYPECTOMY;  Surgeon: Harvel Quale, MD;  [facility];  Service: Gastroenterology;;   SHOULDER OPEN ROTATOR CUFF REPAIR     RT SHOULDER   SPINAL FUSION     SPINAL  FUSION     TONSILLECTOMY     TOTAL KNEE ARTHROPLASTY Left 05/28/2015   Procedure: LEFT TOTAL KNEE ARTHROPLASTY;  Surgeon: Gaynelle Arabian, MD;  Location: [facility];  Service: Orthopedics;  Laterality: Left;   TOTAL KNEE REVISION Left 02/28/2016   Procedure: LEFT TOTAL KNEE REVISION;  Surgeon: Leandrew Koyanagi, MD;  Location: [facility];  Service: Orthopedics;  Laterality: Left;   TRIGGER FINGER RELEASE     RT HAND   1990S   UVULECTOMY  2004   TURBINATE,TONSIL,ADENOID     Current Meds  Medication Sig   Apremilast (OTEZLA) 30 MG TABS Take 30 mg by mouth 2 (two) times daily.   atorvastatin (LIPITOR) 40 MG tablet Take 1 tablet (40 mg total) by  mouth daily.   Calcium 200 MG TABS Take 400 mg by mouth in the morning and at bedtime.   Cholecalciferol (VITAMIN D3) 250 MCG (10000 UT) TABS Take 1,000 Units by mouth daily.   enzalutamide (XTANDI) 80 MG tablet Take 80 mg by mouth daily.   ferrous sulfate 325 (65 FE) MG tablet Take 1 tablet (325 mg total) by mouth 2 (two) times daily with a meal. Mon, wed, fri   finasteride (PROSCAR) 5 MG tablet Take 1 tablet (5 mg total) by mouth daily.   furosemide (LASIX) 20 MG tablet Take 1 tablet (20 mg total) by mouth every other day. Tuesday, Thursday and Saturday   pantoprazole (PROTONIX) 40 MG tablet Take 1 tablet (40 mg total) by mouth 2 (two) times daily.   pregabalin (LYRICA) 75 MG capsule Take 1 capsule (75 mg total) by mouth 2 (two) times daily.   topiramate (TOPAMAX) 50 MG tablet Take 0.5 tablets (25 mg total) by mouth 2 (two) times daily.   vitamin B-12 (CYANOCOBALAMIN) 500 MCG tablet Take 1,000 mcg by mouth daily.   zolpidem (AMBIEN) 5 MG tablet Take 5-10 mg by mouth at bedtime as needed for sleep.     Family History  Problem Relation Age of Onset   Colon cancer Sister    Hypertension Other    Depression Brother    Depression Daughter     Social History   Socioeconomic History   Marital status: Married    Spouse name: Not on file   Number of children:  Not on file   Years of education: Not on file   Highest education level: Not on file  Occupational History   Not on file  Tobacco Use   Smoking status: Former    Packs/day: 0.10    Years: 18.00    Total pack years: 1.80    Types: Cigars, Cigarettes    Start date: 12/18/1958    Quit date: 12/01/1986    Years since quitting: 35.9   Smokeless tobacco: Never   Tobacco comments:    Smoke cigars daily per daughter.  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   Social Determinants of Health   Financial Resource Strain: Not on file  Food Insecurity: No Food Insecurity (08/08/2022)   Hunger Vital Sign    Worried About Running Out of Food in the Last Year: Never true    Ran Out of Food in the Last Year: Never true  Transportation Needs: No Transportation Needs (08/08/2022)   PRAPARE - Hydrologist (Medical): No    Lack of Transportation (Non-Medical): No  Physical Activity: Not on file  Stress: Not on file  Social Connections: Not on file   Review of Systems: Gen: Denies fever, chills, anorexia. Denies fatigue, weakness, weight loss.  CV: Denies chest pain, palpitations, syncope, peripheral edema, and claudication. Resp: Denies dyspnea at rest, cough, wheezing, coughing up blood, and pleurisy. GI: see HPI Derm: Denies rash, itching, dry skin Psych: Denies depression, anxiety, memory loss, confusion. No homicidal or suicidal ideation.  Heme: Denies bruising, bleeding, and enlarged lymph nodes.  Observations/Objective: No distress. Unable to perform physical exam due to telephone encounter. No video available.  Assessment and Plan: Celia Gibbons. Bolls is a 88 y.o. male who presents virtually today, accompanied by  his daughter, for decreased appetite and diarrhea.   Decreased appetite/weight loss:decreased appetite and accompanied weight loss since recent hospitalizations.illness. Daughter states he is not very hungry and eats very small amounts. Dr Jenetta Downer had recommended Remeron to help with patient's appetite most recently and There had been discussion previously with PCP about patient starting remeron in place of his ambien, however, he did not want to do this then. Given weight has decreased about 20 pounds, his daughter Jared Bridges would like to try remeron at this time. Will start remeron 7.'5mg'$  Q[facility], he should stop his ambien when starting this. He is doing boost shakes TID and is encouraged to continue as nutritional supplementation but to also liberalize diet and allow patient to eat whatever he likes to help maintain weight as well. Will reassess how he is doing in about 1 month to ensure he is tolerating the med and evaluate if appetite is improving.   Diarrhea: ongoing since hospitalization for sepsis a few months back. Last antibiotic use was in August. He is having loose to watery stools everyday. As he is incontinent and wears a brief, difficult to determined how many stools per day he is having. No abdominal pain, nausea, vomiting, rectal bleeding, stools darker as he is on PO iron. They are using Imodium on occasion. Will check C diff and GI pathogen panel to rule out infectious etiology given somewhat recent antibiotic use. Advised they can continue to give him imodium PRN and start a daily probiotic as well.   -GI pathogen panel/C diff -Imodium PRN for diarrhea -start daily probiotic -continue PO Iron BID -continue Protonix BID -continue Boost shakes TID -liberalize diet as tolerated    Follow Up Instructions: 1 month, virtual okay   I discussed the assessment and treatment plan with the patient. The patient was provided an opportunity to ask questions and all were answered. The patient agreed  with the plan and demonstrated an understanding of the instructions.   The patient was advised to call back or seek an in-person evaluation if the symptoms worsen or if the condition fails to improve as anticipated.  I provided 25 minutes of face-to-face time during this MyChart Video encounter.  Daisa Stennis L. Alver Sorrow, MSN, APRN, AGNP-C Adult-Gerontology Nurse Practitioner [facility]  I have reviewed the note and agree with the APP's assessment as described in this progress note  Will start low dose Remeron at night, patient and family will be notified about the change in dosing  [name], MD Gastroenterology and [facility]

## 2022-11-20 NOTE — Patient Instructions (Signed)
We will check stool studies to rule out infectious causes of diarrhea  -you can use Imodium as needed. He can take this daily for diarrhea if he needs to -start daily probiotic -continue Iron twice daily  -continue Protonix twice daily  -continue Boost shakes three times per day -liberalize diet as tolerated -stop ambien and start remeron '15mg'$  nightly to help with appetite  Follow up 1 month

## 2022-11-22 DIAGNOSIS — R197 Diarrhea, unspecified: Secondary | ICD-10-CM | POA: Insufficient documentation

## 2022-11-22 DIAGNOSIS — R630 Anorexia: Secondary | ICD-10-CM | POA: Insufficient documentation

## 2022-11-25 ENCOUNTER — Encounter (INDEPENDENT_AMBULATORY_CARE_PROVIDER_SITE_OTHER): Payer: Self-pay

## 2022-11-25 LAB — C. DIFFICILE GDH AND TOXIN A/B
GDH ANTIGEN: DETECTED
MICRO NUMBER:: 14351815
SPECIMEN QUALITY:: ADEQUATE
TOXIN A AND B: NOT DETECTED

## 2022-11-25 LAB — GASTROINTESTINAL PATHOGEN PNL
CampyloBacter Group: NOT DETECTED
Norovirus GI/GII: NOT DETECTED
Rotavirus A: NOT DETECTED
Salmonella species: NOT DETECTED
Shiga Toxin 1: NOT DETECTED
Shiga Toxin 2: NOT DETECTED
Shigella Species: NOT DETECTED
Vibrio Group: NOT DETECTED
Yersinia enterocolitica: NOT DETECTED

## 2022-11-25 LAB — CLOSTRIDIUM DIFFICILE TOXIN B, QUALITATIVE, REAL-TIME PCR: Toxigenic C. Difficile by PCR: DETECTED — AB

## 2022-11-26 ENCOUNTER — Encounter (INDEPENDENT_AMBULATORY_CARE_PROVIDER_SITE_OTHER): Payer: Self-pay | Admitting: Gastroenterology

## 2022-11-26 ENCOUNTER — Other Ambulatory Visit: Payer: Self-pay

## 2022-11-26 ENCOUNTER — Other Ambulatory Visit (INDEPENDENT_AMBULATORY_CARE_PROVIDER_SITE_OTHER): Payer: Self-pay | Admitting: Gastroenterology

## 2022-11-26 ENCOUNTER — Emergency Department (HOSPITAL_COMMUNITY): Payer: Medicare Other

## 2022-11-26 ENCOUNTER — Observation Stay (HOSPITAL_COMMUNITY)
Admission: EM | Admit: 2022-11-26 | Discharge: 2022-11-28 | Disposition: A | Discharge status: Home / Self Care | Payer: Medicare Other | Attending: Internal Medicine | Admitting: Internal Medicine

## 2022-11-26 ENCOUNTER — Other Ambulatory Visit (HOSPITAL_COMMUNITY): Payer: Self-pay

## 2022-11-26 ENCOUNTER — Encounter (HOSPITAL_COMMUNITY): Payer: Self-pay | Admitting: Internal Medicine

## 2022-11-26 DIAGNOSIS — R338 Other retention of urine: Secondary | ICD-10-CM

## 2022-11-26 DIAGNOSIS — Z79899 Other long term (current) drug therapy: Secondary | ICD-10-CM | POA: Insufficient documentation

## 2022-11-26 DIAGNOSIS — Z91040 Latex allergy status: Secondary | ICD-10-CM | POA: Insufficient documentation

## 2022-11-26 DIAGNOSIS — E1122 Type 2 diabetes mellitus with diabetic chronic kidney disease: Secondary | ICD-10-CM | POA: Insufficient documentation

## 2022-11-26 DIAGNOSIS — I129 Hypertensive chronic kidney disease with stage 1 through stage 4 chronic kidney disease, or unspecified chronic kidney disease: Secondary | ICD-10-CM | POA: Diagnosis not present

## 2022-11-26 DIAGNOSIS — R531 Weakness: Secondary | ICD-10-CM | POA: Insufficient documentation

## 2022-11-26 DIAGNOSIS — Z87891 Personal history of nicotine dependence: Secondary | ICD-10-CM | POA: Diagnosis not present

## 2022-11-26 DIAGNOSIS — N35911 Unspecified urethral stricture, male, meatal: Secondary | ICD-10-CM | POA: Diagnosis not present

## 2022-11-26 DIAGNOSIS — Z8673 Personal history of transient ischemic attack (TIA), and cerebral infarction without residual deficits: Secondary | ICD-10-CM | POA: Insufficient documentation

## 2022-11-26 DIAGNOSIS — I10 Essential (primary) hypertension: Secondary | ICD-10-CM | POA: Diagnosis not present

## 2022-11-26 DIAGNOSIS — R109 Unspecified abdominal pain: Secondary | ICD-10-CM | POA: Diagnosis present

## 2022-11-26 DIAGNOSIS — Z96652 Presence of left artificial knee joint: Secondary | ICD-10-CM | POA: Insufficient documentation

## 2022-11-26 DIAGNOSIS — A0472 Enterocolitis due to Clostridium difficile, not specified as recurrent: Secondary | ICD-10-CM | POA: Diagnosis not present

## 2022-11-26 DIAGNOSIS — N189 Chronic kidney disease, unspecified: Secondary | ICD-10-CM | POA: Insufficient documentation

## 2022-11-26 DIAGNOSIS — R339 Retention of urine, unspecified: Secondary | ICD-10-CM | POA: Diagnosis not present

## 2022-11-26 DIAGNOSIS — E860 Dehydration: Secondary | ICD-10-CM | POA: Insufficient documentation

## 2022-11-26 DIAGNOSIS — R413 Other amnesia: Secondary | ICD-10-CM | POA: Insufficient documentation

## 2022-11-26 HISTORY — DX: Malignant neoplasm of prostate: C61

## 2022-11-26 LAB — URINALYSIS, ROUTINE W REFLEX MICROSCOPIC
Bacteria, UA: NONE SEEN
Bilirubin Urine: NEGATIVE
Glucose, UA: NEGATIVE mg/dL
Ketones, ur: NEGATIVE mg/dL
Nitrite: NEGATIVE
Protein, ur: 100 mg/dL — AB
RBC / HPF: >50 RBC/hpf — ABNORMAL HIGH (ref 0–5)
Specific Gravity, Urine: 1.015 (ref 1.005–1.030)
WBC, UA: >50 WBC/hpf — ABNORMAL HIGH (ref 0–5)
pH: 6 (ref 5.0–8.0)

## 2022-11-26 LAB — CBC WITH DIFFERENTIAL/PLATELET
Abs Immature Granulocytes: 0.09 10*3/uL — ABNORMAL HIGH (ref 0.00–0.07)
Basophils Absolute: 0 10*3/uL (ref 0.0–0.1)
Basophils Relative: 0 %
Eosinophils Absolute: 0.3 10*3/uL (ref 0.0–0.5)
Eosinophils Relative: 4 %
HCT: 36.3 % — ABNORMAL LOW (ref 39.0–52.0)
Hemoglobin: 10.8 g/dL — ABNORMAL LOW (ref 13.0–17.0)
Immature Granulocytes: 1 %
Lymphocytes Relative: 9 %
Lymphs Abs: 0.6 10*3/uL — ABNORMAL LOW (ref 0.7–4.0)
MCH: 26.9 pg (ref 26.0–34.0)
MCHC: 29.8 g/dL — ABNORMAL LOW (ref 30.0–36.0)
MCV: 90.5 fL (ref 80.0–100.0)
Monocytes Absolute: 0.4 10*3/uL (ref 0.1–1.0)
Monocytes Relative: 6 %
Neutro Abs: 5.6 10*3/uL (ref 1.7–7.7)
Neutrophils Relative %: 80 %
Platelets: 259 10*3/uL (ref 150–400)
RBC: 4.01 MIL/uL — ABNORMAL LOW (ref 4.22–5.81)
RDW: 15.6 % — ABNORMAL HIGH (ref 11.5–15.5)
WBC: 6.9 10*3/uL (ref 4.0–10.5)
nRBC: 0 % (ref 0.0–0.2)

## 2022-11-26 LAB — COMPREHENSIVE METABOLIC PANEL
ALT: 8 U/L (ref 0–44)
AST: 16 U/L (ref 15–41)
Albumin: 2.9 g/dL — ABNORMAL LOW (ref 3.5–5.0)
Alkaline Phosphatase: 61 U/L (ref 38–126)
Anion gap: 7 (ref 5–15)
BUN: 16 mg/dL (ref 8–23)
CO2: 27 mmol/L (ref 22–32)
Calcium: 9.4 mg/dL (ref 8.9–10.3)
Chloride: 104 mmol/L (ref 98–111)
Creatinine, Ser: 0.85 mg/dL (ref 0.61–1.24)
GFR, Estimated: >60 mL/min (ref >60)
Glucose, Bld: 111 mg/dL — ABNORMAL HIGH (ref 70–99)
Potassium: 4 mmol/L (ref 3.5–5.1)
Sodium: 138 mmol/L (ref 135–145)
Total Bilirubin: 0.7 mg/dL (ref 0.3–1.2)
Total Protein: 6.9 g/dL (ref 6.5–8.1)

## 2022-11-26 LAB — CBG MONITORING, ED: Glucose-Capillary: 118 mg/dL — ABNORMAL HIGH (ref 70–99)

## 2022-11-26 LAB — LIPASE, BLOOD: Lipase: 23 U/L (ref 11–51)

## 2022-11-26 MED ORDER — TOPIRAMATE 25 MG PO TABS
50.0000 mg | ORAL_TABLET | Freq: Two times a day (BID) | ORAL | Status: DC
  Administered 2022-11-26 – 2022-11-28 (×4): 50 mg via ORAL
  Filled 2022-11-26 (×4): qty 2

## 2022-11-26 MED ORDER — MORPHINE SULFATE (PF) 4 MG/ML IV SOLN
4.0000 mg | Freq: Once | INTRAVENOUS | Status: AC
  Administered 2022-11-26: 4 mg via INTRAVENOUS
  Filled 2022-11-26: qty 1

## 2022-11-26 MED ORDER — ONDANSETRON HCL 4 MG PO TABS: 4.0000 mg | ORAL_TABLET | Freq: Four times a day (QID) | ORAL | Status: DC | PRN

## 2022-11-26 MED ORDER — VANCOMYCIN HCL 125 MG PO CAPS: 125.0000 mg | ORAL_CAPSULE | Freq: Four times a day (QID) | ORAL | 0 refills | Status: DC

## 2022-11-26 MED ORDER — PREGABALIN 75 MG PO CAPS
75.0000 mg | ORAL_CAPSULE | Freq: Two times a day (BID) | ORAL | Status: DC
  Administered 2022-11-26 – 2022-11-28 (×4): 75 mg via ORAL
  Filled 2022-11-26 (×4): qty 1

## 2022-11-26 MED ORDER — ZOLPIDEM TARTRATE 5 MG PO TABS: 5.0000 mg | ORAL_TABLET | Freq: Every evening | ORAL | Status: DC | PRN

## 2022-11-26 MED ORDER — SODIUM CHLORIDE 0.9 % IV BOLUS
500.0000 mL | Freq: Once | INTRAVENOUS | Status: AC
  Administered 2022-11-26: 500 mL via INTRAVENOUS

## 2022-11-26 MED ORDER — ONDANSETRON HCL 4 MG/2ML IJ SOLN: 4.0000 mg | Freq: Four times a day (QID) | INTRAMUSCULAR | Status: DC | PRN

## 2022-11-26 MED ORDER — VANCOMYCIN HCL 125 MG PO CAPS
125.0000 mg | ORAL_CAPSULE | Freq: Four times a day (QID) | ORAL | Status: DC
  Administered 2022-11-26 – 2022-11-28 (×8): 125 mg via ORAL
  Filled 2022-11-26 (×21): qty 1

## 2022-11-26 MED ORDER — LIDOCAINE HCL URETHRAL/MUCOSAL 2 % EX GEL
1.0000 | Freq: Once | CUTANEOUS | Status: AC
  Administered 2022-11-26: 1 via URETHRAL
  Filled 2022-11-26: qty 10

## 2022-11-26 MED ORDER — CEPHALEXIN 500 MG PO CAPS
500.0000 mg | ORAL_CAPSULE | Freq: Three times a day (TID) | ORAL | Status: DC
  Administered 2022-11-26: 500 mg via ORAL
  Filled 2022-11-26 (×2): qty 1

## 2022-11-26 MED ORDER — ACETAMINOPHEN 650 MG RE SUPP: 650.0000 mg | Freq: Four times a day (QID) | RECTAL | Status: DC | PRN

## 2022-11-26 MED ORDER — ACETAMINOPHEN 325 MG PO TABS
650.0000 mg | ORAL_TABLET | Freq: Four times a day (QID) | ORAL | Status: DC | PRN
  Administered 2022-11-27: 650 mg via ORAL
  Filled 2022-11-26: qty 2

## 2022-11-26 MED ORDER — LACTATED RINGERS IV SOLN: INTRAVENOUS | Status: AC

## 2022-11-26 MED ORDER — LORAZEPAM 1 MG PO TABS
1.0000 mg | ORAL_TABLET | Freq: Once | ORAL | Status: AC
  Administered 2022-11-26: 1 mg via ORAL
  Filled 2022-11-26: qty 1

## 2022-11-26 MED ORDER — FINASTERIDE 5 MG PO TABS
5.0000 mg | ORAL_TABLET | Freq: Every day | ORAL | Status: DC
  Administered 2022-11-27 – 2022-11-28 (×2): 5 mg via ORAL
  Filled 2022-11-26 (×2): qty 1

## 2022-11-26 NOTE — Assessment & Plan Note (Signed)
As far back as 10-25-2019, pt's PCP has noted memory loss in diagnosis. Per EMR, pt's family has disputed that pt has dementia.

## 2022-11-26 NOTE — Assessment & Plan Note (Signed)
Per EDP report, pt unable to urinate. EDP unable to place foley. Per EDP, on-call urologist contacted to assist with foley placement.

## 2022-11-26 NOTE — Subjective & Objective (Addendum)
CC: C. Diff diarrhea HPI: 88 year old white male history of hypertension, orthostatic hypotension, polymyalgia rheumatica, prior history of stroke, history of type 2 diabetes, presents to the ER today with reported diarrhea.  Patient had a video visit with GI on 11/20/2022.  Reportedly patient having diarrhea daily.  Patient is unable to give any history review of systems due to confusion/memory loss.  Per the video visit from 1221, patient's last antibiotic dose was in August 2023.  Appetite has been poor.  Orders were placed for C. difficile and GI pathogen.  Sample was collected on 11/21/2002.  Due to the Christmas holiday, it appears that the patient's daughter was not notified of the patient's positive C. difficile test until today.  Patient had a positive PCR for C. difficile.  Does not appear that toxin was tested for.  Oral vancomycin was called into the pharmacy today.  Unknown of the patient's actually started therapy yet.  Patient's daughter is not available in the ER.  Reportedly they have been messaging with their GI physician Dr. Jenetta Downer who wanted the patient admitted to the hospital.  No reports of any vomiting.  Patient presented via EMS.  On arrival temp 97.7 heart rate 78 blood pressure 129/65 satting 93% room air.  Labs showed a white count 6.9, hemoglobin 10.8, platelets of 259  Sodium 138, [place] 4.0, BUN of 16, creatinine 0.8 glucose of 111  Lipase normal at 23.  CT head showed chronic small vessel ischemic changes.  No acute intracranial abnormality.  Of note, the patient's hemoglobin was 10.8 today.  On December 12, hemoglobin was 9.7.  [facility] hospitalist contacted for admission.

## 2022-11-26 NOTE — Assessment & Plan Note (Signed)
Hold lasix due to dehydration.

## 2022-11-26 NOTE — Assessment & Plan Note (Signed)
Mild dehydration. Baseline Hgb 9.7 from labs on 11/11/2022. Slightly hemoconcentrated with HgB up to 10.8 today. Start LR @ 100 ml/hr x 10 hours. No evidence of AKI.

## 2022-11-26 NOTE — H&P (Signed)
History and Physical    Jared Bridges YDX:[id number] DOB: 18-Nov-1930 DOA: 11/26/2022  DOS: the patient was seen and examined on 11/26/2022  PCP: [name], MD   Patient coming from: Home  I have personally briefly reviewed patient's old medical records in [place]  CC: C. Diff diarrhea HPI: 87 year old white male history of hypertension, orthostatic hypotension, polymyalgia rheumatica, prior history of stroke, history of type 2 diabetes, presents to the ER today with reported diarrhea.  Patient had a video visit with GI on 11/20/2022.  Reportedly patient having diarrhea daily.  Patient is unable to give any history review of systems due to confusion/memory loss.  Per the video visit from 1221, patient's last antibiotic dose was in August 2023.  Appetite has been poor.  Orders were placed for C. difficile and GI pathogen.  Sample was collected on 11/21/2002.  Due to the Christmas holiday, it appears that the patient's daughter was not notified of the patient's positive C. difficile test until today.  Patient had a positive PCR for C. difficile.  Does not appear that toxin was tested for.  Oral vancomycin was called into the pharmacy today.  Unknown of the patient's actually started therapy yet.  Patient's daughter is not available in the ER.  Reportedly they have been messaging with their GI physician Dr. Jenetta Downer who wanted the patient admitted to the hospital.  No reports of any vomiting.  Patient presented via EMS.  On arrival temp 97.7 heart rate 78 blood pressure 129/65 satting 93% room air.  Labs showed a white count 6.9, hemoglobin 10.8, platelets of 259  Sodium 138, [place] 4.0, BUN of 16, creatinine 0.8 glucose of 111  Lipase normal at 23.  CT head showed chronic small vessel ischemic changes.  No acute intracranial abnormality.  Of note, the patient's hemoglobin was 10.8 today.  On December 12, hemoglobin was 9.7.  [facility] contacted for  admission.   ED Course: normal WBC. Afebrile.  Review of Systems:  Review of Systems  Unable to perform ROS: Other  Pt is a poor historian  Past Medical History:  Diagnosis Date   Acute cholecystitis 04/05/2016   Anemia, iron deficiency    Chronic headaches    Migraines   Chronic kidney disease    Resolved   Depression    Diverticulitis    Essential hypertension    GI bleed    Dr. Laural Golden - 1998   Hypercholesteremia    Orthostatic hypotension    Peptic ulcer disease    Peripheral neuropathy    Polymyalgia rheumatica ([place])    Rectal bleeding 11/26/2017   Added automatically from request for surgery [id number]   Stroke [facility])    Residual trouble reading and writing   Syncope    Thyroid nodule    TIA (transient ischemic attack) 12/2017   Type 2 diabetes mellitus ([place])    Upper GI bleed 06/18/2012    Past Surgical History:  Procedure Laterality Date   APPENDECTOMY     as child   BACK SURGERY     2000 IDET SPINAL PROC   CHOLECYSTECTOMY N/A 04/07/2016   Procedure: CHOLECYSTECTOMY;  Surgeon: Aviva Signs, MD;  Location: [facility];  Service: General;  Laterality: N/A;   [place]   COLONOSCOPY N/A 01/07/2018   Procedure: COLONOSCOPY;  Surgeon: Rogene Houston, MD;  [facility];  Service: Endoscopy;  Laterality: N/A;  1:00   COLONOSCOPY WITH PROPOFOL  N/A 08/12/2022   Procedure: COLONOSCOPY WITH PROPOFOL;  Surgeon: Harvel Quale, MD;  Location: [facility];  Service: Gastroenterology;  Laterality: N/A;  ASA 3   CRYOTHERAPY     ESOPHAGOGASTRODUODENOSCOPY (EGD) WITH PROPOFOL N/A 08/09/2022   Procedure: ESOPHAGOGASTRODUODENOSCOPY (EGD) WITH PROPOFOL;  Surgeon: Harvel Quale, MD;  [facility];  Service: Gastroenterology;  Laterality: N/A;   EYE SURGERY     right cataract with lens implant   FEMUR SURGERY     RT [place] STUDY N/A 08/13/2022   Procedure: GIVENS CAPSULE STUDY;   Surgeon: Harvel Quale, MD;  [facility]: [facility];  Service: Gastroenterology;  Laterality: N/A;   HAND SURGERY     RIGHT   HERNIA REPAIR     1-right inguinal, 3- left inguinal   IRRIGATION AND DEBRIDEMENT KNEE Left 07/09/2015   Procedure: LEFT KNEE IRRIGATION AND DEBRIDEMENT WOUND CLOSURE;  Surgeon: Gaynelle Arabian, MD;  Location: [facility];  Service: Orthopedics;  Laterality: Left;   JOINT REPLACEMENT  02/28/2016   lt knee revision   KNEE DEBRIDEMENT     2016   LEFT   MENISCUS REPAIR     LEFT   POLYPECTOMY  01/07/2018   Procedure: POLYPECTOMY;  Surgeon: Rogene Houston, MD;  [facility];  Service: Endoscopy;;  colon    POLYPECTOMY  08/12/2022   Procedure: POLYPECTOMY;  Surgeon: Harvel Quale, MD;  [facility];  Service: Gastroenterology;;   SHOULDER OPEN ROTATOR CUFF REPAIR     RT SHOULDER   SPINAL FUSION     SPINAL FUSION     TONSILLECTOMY     TOTAL KNEE ARTHROPLASTY Left 05/28/2015   Procedure: LEFT TOTAL KNEE ARTHROPLASTY;  Surgeon: Gaynelle Arabian, MD;  Location: [facility];  Service: Orthopedics;  Laterality: Left;   TOTAL KNEE REVISION Left 02/28/2016   Procedure: LEFT TOTAL KNEE REVISION;  Surgeon: Leandrew Koyanagi, MD;  Location: [facility];  Service: Orthopedics;  Laterality: Left;   TRIGGER FINGER RELEASE     RT HAND   1990S   UVULECTOMY  2004   TURBINATE,TONSIL,ADENOID     reports that he quit smoking about 36 years ago. His smoking use included cigars and cigarettes. He started smoking about 63 years ago. He has a 1.80 pack-year smoking history. He has never used smokeless tobacco. He reports that he does not drink alcohol and does not use drugs.  Allergies  Allergen Reactions   Iodine Hives, Itching and Other (See Comments)    very allergic(per daughter), can pre-med with benadryl   Calan [Verapamil] Other (See Comments)    Weakness   Cymbalta [Duloxetine Hcl] Other (See Comments)    Makes patient have jerking motions.   Iodinated  Contrast Media Hives, Itching and Other (See Comments)    Can pre-med with benadryl   Latex Other (See Comments)    Redness iritation   Lisinopril Other (See Comments)    Makes patient have jerking motions.   Neurontin [Gabapentin] Other (See Comments)    Makes patient have jerking motions.   Zoloft [Sertraline Hcl] Diarrhea and Other (See Comments)    Makes patient have jerking motions.   Valium [Diazepam] Other (See Comments)    "Did not work"   Flomax [Tamsulosin Hcl] Other (See Comments)    Dizziness   Glimepiride Other (See Comments)    "Did not work"   Melatonin Nausea Only   Tape  Other (See Comments)    Skin irritation    Family History  Problem Relation Age of Onset   Colon cancer Sister    Hypertension Other    Depression Brother    Depression Daughter     Prior to Admission medications   Medication Sig Start Date End Date Taking? Authorizing Provider  Apremilast (OTEZLA) 30 MG TABS Take 30 mg by mouth 2 (two) times daily.   Yes [provider]  atorvastatin (LIPITOR) 40 MG tablet Take 1 tablet (40 mg total) by mouth daily. 07/09/22  Yes Gerlene Fee, NP  Calcium 200 MG TABS Take 400 mg by mouth in the morning and at bedtime.   Yes [provider]  Cholecalciferol (VITAMIN D3) 250 MCG (10000 UT) TABS Take 1,000 Units by mouth daily.   Yes [provider]  enzalutamide (XTANDI) 80 MG tablet Take 80 mg by mouth daily.   Yes [provider]  ferrous sulfate 325 (65 FE) MG tablet Take 1 tablet (325 mg total) by mouth 2 (two) times daily with a meal. Mon, wed, fri 08/15/22  Yes Johnson, Clanford L, MD  finasteride (PROSCAR) 5 MG tablet Take 1 tablet (5 mg total) by mouth daily. 07/09/22  Yes Gerlene Fee, NP  furosemide (LASIX) 20 MG tablet Take 1 tablet (20 mg total) by mouth every other day. Tuesday, Thursday and Saturday 08/15/22 11/26/22 Yes Johnson, Clanford L, MD  mirtazapine (REMERON) 15 MG tablet Take 1 tablet (15 mg total) by  mouth at bedtime. 11/20/22  Yes Carlan, [name] L, NP  pantoprazole (PROTONIX) 40 MG tablet Take 1 tablet (40 mg total) by mouth 2 (two) times daily. 08/15/22 08/15/23 Yes Johnson, Clanford L, MD  pregabalin (LYRICA) 75 MG capsule Take 1 capsule (75 mg total) by mouth 2 (two) times daily. 07/09/22  Yes Gerlene Fee, NP  topiramate (TOPAMAX) 50 MG tablet Take 0.5 tablets (25 mg total) by mouth 2 (two) times daily. Patient taking differently: Take 50 mg by mouth 2 (two) times daily. 07/09/22  Yes Gerlene Fee, NP  vitamin B-12 (CYANOCOBALAMIN) 500 MCG tablet Take 1,000 mcg by mouth daily.   Yes [provider]  zolpidem (AMBIEN) 5 MG tablet Take 5-10 mg by mouth at bedtime as needed for sleep. 07/09/22  Yes [provider]  vancomycin (VANCOCIN) 125 MG capsule Take 1 capsule (125 mg total) by mouth 4 (four) times daily for 10 days. Patient not taking: Reported on 11/26/2022 11/26/22 12/06/22  Gabriel Rung, NP    Physical Exam: Vitals:   11/26/22 1600 11/26/22 1700 11/26/22 1715 11/26/22 1730  BP: 125/74 (!) 155/79  (!) 157/71  Pulse: (!) 57 66 66   Resp: 13 (!) 22 19 (!) 21  Temp: 97.7 F (36.5 C)     TempSrc: Oral     SpO2: 95% 94% 98%   Weight:      Height:        Physical Exam Vitals and nursing note reviewed.  Constitutional:      General: He is not in acute distress.    Comments: Appears stated age of 45  HENT:     Head: Normocephalic and atraumatic.  Eyes:     General: No scleral icterus. Cardiovascular:     Rate and Rhythm: Normal rate and regular rhythm.  Pulmonary:     Effort: Pulmonary effort is normal. No respiratory distress.     Breath sounds: Normal breath sounds.  Abdominal:     General:  Bowel sounds are normal. There is no distension.     Tenderness: There is no abdominal tenderness. There is no rebound.  Musculoskeletal:     Right lower leg: No edema.     Left lower leg: No edema.  Skin:    General: Skin is warm and dry.   Neurological:     General: No focal deficit present.     Mental Status: He is alert. He is disoriented.      Labs on Admission: I have personally reviewed following labs and imaging studies  CBC: Recent Labs  Lab 11/26/22 1317  WBC 6.9  NEUTROABS 5.6  HGB 10.8*  HCT 36.3*  MCV 90.5  PLT 025   Basic Metabolic Panel: Recent Labs  Lab 11/26/22 1317  NA 138  K 4.0  CL 104  CO2 27  GLUCOSE 111*  BUN 16  CREATININE 0.85  CALCIUM 9.4   GFR: Estimated Creatinine Clearance: 63 mL/min (by C-G formula based on SCr of 0.85 mg/dL). Liver Function Tests: Recent Labs  Lab 11/26/22 1317  AST 16  ALT 8  ALKPHOS 61  BILITOT 0.7  PROT 6.9  ALBUMIN 2.9*   Recent Labs  Lab 11/26/22 1317  LIPASE 23   No results for input(s): "AMMONIA" in the last 168 hours. Coagulation Profile: No results for input(s): "INR", "PROTIME" in the last 168 hours. Cardiac Enzymes: No results for input(s): "CKTOTAL", "CKMB", "CKMBINDEX", "TROPONINI", "TROPONINIHS" in the last 168 hours. BNP (last 3 results) No results for input(s): "PROBNP" in the last 8760 hours. HbA1C: No results for input(s): "HGBA1C" in the last 72 hours. CBG: Recent Labs  Lab 11/26/22 1324  GLUCAP 118*   Lipid Profile: No results for input(s): "CHOL", "HDL", "LDLCALC", "TRIG", "CHOLHDL", "LDLDIRECT" in the last 72 hours. Thyroid Function Tests: No results for input(s): "TSH", "T4TOTAL", "FREET4", "T3FREE", "THYROIDAB" in the last 72 hours. Anemia Panel: No results for input(s): "VITAMINB12", "FOLATE", "FERRITIN", "TIBC", "IRON", "RETICCTPCT" in the last 72 hours. Urine analysis:    Component Value Date/Time   COLORURINE YELLOW 08/08/2022 1409   APPEARANCEUR CLEAR 08/08/2022 1409   LABSPEC 1.015 08/08/2022 1409   PHURINE 5.0 08/08/2022 1409   GLUCOSEU NEGATIVE 08/08/2022 1409   HGBUR NEGATIVE 08/08/2022 1409   BILIRUBINUR NEGATIVE 08/08/2022 1409   BILIRUBINUR negative 04/19/2019 1354   KETONESUR NEGATIVE  08/08/2022 1409   PROTEINUR NEGATIVE 08/08/2022 1409   UROBILINOGEN 0.2 04/19/2019 1354   UROBILINOGEN 1.0 05/21/2015 1050   NITRITE NEGATIVE 08/08/2022 1409   LEUKOCYTESUR NEGATIVE 08/08/2022 1409    Radiological Exams on Admission: I have personally reviewed images CT Head Wo Contrast  Result Date: 11/26/2022 CLINICAL DATA:  Mental status change, unknown cause. EXAM: CT HEAD WITHOUT CONTRAST TECHNIQUE: Contiguous axial images were obtained from the base of the skull through the vertex without intravenous contrast. RADIATION DOSE REDUCTION: This exam was performed according to the departmental dose-optimization program which includes automated exposure control, adjustment of the mA and/or kV according to patient size and/or use of iterative reconstruction technique. COMPARISON:  Head CT 06/16/2022 and MRI 07/01/2021 FINDINGS: Brain: There is no evidence of an acute infarct, intracranial hemorrhage, mass, midline shift, or extra-axial fluid collection. Hypodensities in the cerebral white matter bilaterally are unchanged and nonspecific but compatible with mild chronic small vessel ischemic disease. There is mild cerebral atrophy. Vascular: Calcified atherosclerosis at the skull base. No hyperdense vessel. Skull: No acute fracture or suspicious osseous lesion. Sinuses/Orbits: Chronic medial right orbital fracture. Visualized paranasal sinuses and mastoid air cells  are clear. Bilateral cataract extraction. Other: None. IMPRESSION: 1. No evidence of acute intracranial abnormality. 2. Mild chronic small vessel ischemic disease. Electronically Signed   By: Logan Bores M.D.   On: 11/26/2022 15:35    EKG: My personal interpretation of EKG shows: NSR    Assessment/Plan Principal Problem:   C. difficile diarrhea Active Problems:   Dehydration   Essential hypertension   Acute urinary retention   Memory loss    Assessment and Plan: * C. difficile diarrhea Observation med/surg bed. Continue po  vancomycin. GI consult. Check for C. Diff toxin.   Acute urinary retention Per EDP report, pt unable to urinate. EDP unable to place foley. Per EDP, on-call urologist contacted to assist with foley placement.  Essential hypertension Hold lasix due to dehydration.  Dehydration Mild dehydration. Baseline Hgb 9.7 from labs on 11/11/2022. Slightly hemoconcentrated with HgB up to 10.8 today. Start LR @ 100 ml/hr x 10 hours. No evidence of AKI.  Memory loss As far back as 10-25-2019, pt's PCP has noted memory loss in diagnosis. Per EMR, pt's family has disputed that pt has dementia.    DVT prophylaxis: SCDs Code Status: Full Code by default. Pt unable to make medical decision for himself Family Communication: no family at bedside  Disposition Plan: return home  Consults called: placed consult order to Dr. Jenetta Downer Admission status: Observation, Med-Surg   Kristopher Oppenheim, DO [facility] 11/26/2022, 8:01 PM

## 2022-11-26 NOTE — TOC Benefit Eligibility Note (Signed)
Patient Teacher, English as a foreign language completed.    The patient is currently admitted and upon discharge could be taking Dificid 200 mg.  The current 10 day co-pay is $1,119.26.   The patient is currently admitted and upon discharge could be taking vancomycin 125 mg.  The current 30 day co-pay is $30.48.   The patient is insured through [place], [place] Patient Advocate Specialist [place] Patient Advocate Team Direct Number: [phone]  Fax: [phone]

## 2022-11-26 NOTE — ED Triage Notes (Signed)
Arrived by EMS due to pt's GI MD wanting him to be admitted for positive cdiff.

## 2022-11-26 NOTE — ED Provider Notes (Signed)
[facility] EMERGENCY DEPARTMENT Provider Note   CSN: [id number] Arrival date & time: 11/26/22  1230     History  Chief Complaint  Patient presents with   Jared Bridges    Jared Bridges is a 88 y.o. male with a past medical history of hypertension, who presents to the emergency department with concerns for diarrhea.  Per EMS, patient GI doctor wanted to admit him due to positive CD.  At this time patient denies chest Bridges, shortness of breath, Jared Bridges, nausea, vomiting, urinary symptoms, fever.  Discussion with patient and family at bedside, patient family notes that patient has had diarrhea that has been ongoing.  Family notes that patient was started on Imodium due to his persistent watery nonbloody diarrhea.  Also notes that patient has been on Remeron.  Family notes that patient's had a decline with his mental status as well as generalized weakness for the past week.  Family notes that patient does not have a diagnosis of dementia although it is documented throughout his notes.  The history is provided by the patient. No language interpreter was used.       Home Medications Prior to Admission medications   Medication Sig Start Date End Date Taking? Authorizing Provider  Apremilast (OTEZLA) 30 MG TABS Take 30 mg by mouth 2 (two) times daily.   Yes [provider]  atorvastatin (LIPITOR) 40 MG tablet Take 1 tablet (40 mg total) by mouth daily. 07/09/22  Yes Gerlene Fee, NP  Calcium 200 MG TABS Take 400 mg by mouth in the morning and at bedtime.   Yes [provider]  Cholecalciferol (VITAMIN D3) 250 MCG (10000 UT) TABS Take 1,000 Units by mouth daily.   Yes [provider]  enzalutamide (XTANDI) 80 MG tablet Take 80 mg by mouth daily.   Yes [provider]  ferrous sulfate 325 (65 FE) MG tablet Take 1 tablet (325 mg total) by mouth 2 (two) times daily with a meal. Mon, wed, fri 08/15/22  Yes Johnson, Clanford L, MD  finasteride  (PROSCAR) 5 MG tablet Take 1 tablet (5 mg total) by mouth daily. 07/09/22  Yes Gerlene Fee, NP  furosemide (LASIX) 20 MG tablet Take 1 tablet (20 mg total) by mouth every other day. Tuesday, Thursday and Saturday 08/15/22 11/26/22 Yes Johnson, Clanford L, MD  mirtazapine (REMERON) 15 MG tablet Take 1 tablet (15 mg total) by mouth at bedtime. 11/20/22  Yes Carlan, [name] L, NP  pantoprazole (PROTONIX) 40 MG tablet Take 1 tablet (40 mg total) by mouth 2 (two) times daily. 08/15/22 08/15/23 Yes Johnson, Clanford L, MD  pregabalin (LYRICA) 75 MG capsule Take 1 capsule (75 mg total) by mouth 2 (two) times daily. 07/09/22  Yes Gerlene Fee, NP  topiramate (TOPAMAX) 50 MG tablet Take 0.5 tablets (25 mg total) by mouth 2 (two) times daily. Patient taking differently: Take 50 mg by mouth 2 (two) times daily. 07/09/22  Yes Gerlene Fee, NP  vitamin B-12 (CYANOCOBALAMIN) 500 MCG tablet Take 1,000 mcg by mouth daily.   Yes [provider]  zolpidem (AMBIEN) 5 MG tablet Take 5-10 mg by mouth at bedtime as needed for sleep. 07/09/22  Yes [provider]  vancomycin (VANCOCIN) 125 MG capsule Take 1 capsule (125 mg total) by mouth 4 (four) times daily for 10 days. Patient not taking: Reported on 11/26/2022 11/26/22 12/06/22  Gabriel Rung, NP      Allergies    Iodine, Calan [verapamil],  Cymbalta [duloxetine hcl], Iodinated contrast media, Latex, Lisinopril, Neurontin [gabapentin], Zoloft [sertraline hcl], Valium [diazepam], Flomax [tamsulosin hcl], Glimepiride, Melatonin, and Tape    Review of Systems   Review of Systems  Gastrointestinal:  Positive for Jared Bridges.  All other systems reviewed and are negative.   Physical Exam Updated Vital Signs BP (!) 146/64   Pulse 60   Resp 18   Ht '6\' 2"'$  (1.88 m)   Wt 77.1 kg   SpO2 95%   BMI 21.83 kg/m  Physical Exam Vitals and nursing note reviewed.  Constitutional:      General: He is not in acute distress.    Appearance: He is  not diaphoretic.  HENT:     Head: Normocephalic and atraumatic.     Mouth/Throat:     Pharynx: No oropharyngeal exudate.  Eyes:     General: No scleral icterus.    Conjunctiva/sclera: Conjunctivae normal.  Cardiovascular:     Rate and Rhythm: Normal rate and regular rhythm.     Pulses: Normal pulses.     Heart sounds: Normal heart sounds.  Pulmonary:     Effort: Pulmonary effort is normal. No respiratory distress.     Breath sounds: Normal breath sounds. No wheezing.  Jared:     General: Bowel sounds are normal.     Palpations: Abdomen is soft. There is no mass.     Tenderness: There is no Jared tenderness. There is no guarding or rebound.  Musculoskeletal:        General: Normal range of motion.     Cervical back: Normal range of motion and neck supple.  Skin:    General: Skin is warm and dry.  Neurological:     Mental Status: He is alert.  Psychiatric:        Behavior: Behavior normal.     ED Results / Procedures / Treatments   Labs (all labs ordered are listed, but only abnormal results are displayed) Labs Reviewed  CBC WITH DIFFERENTIAL/PLATELET - Abnormal; Notable for the following components:      Result Value   RBC 4.01 (*)    Hemoglobin 10.8 (*)    HCT 36.3 (*)    MCHC 29.8 (*)    RDW 15.6 (*)    Lymphs Abs 0.6 (*)    Abs Immature Granulocytes 0.09 (*)    All other components within normal limits  COMPREHENSIVE METABOLIC PANEL - Abnormal; Notable for the following components:   Glucose, Bld 111 (*)    Albumin 2.9 (*)    All other components within normal limits  CBG MONITORING, ED - Abnormal; Notable for the following components:   Glucose-Capillary 118 (*)    All other components within normal limits  LIPASE, BLOOD  URINALYSIS, ROUTINE W REFLEX MICROSCOPIC    EKG None  Radiology CT Head Wo Contrast  Result Date: 11/26/2022 CLINICAL DATA:  Mental status change, unknown cause. EXAM: CT HEAD WITHOUT CONTRAST TECHNIQUE: Contiguous axial  images were obtained from the base of the skull through the vertex without intravenous contrast. RADIATION DOSE REDUCTION: This exam was performed according to the departmental dose-optimization program which includes automated exposure control, adjustment of the mA and/or kV according to patient size and/or use of iterative reconstruction technique. COMPARISON:  Head CT 06/16/2022 and MRI 07/01/2021 FINDINGS: Brain: There is no evidence of an acute infarct, intracranial hemorrhage, mass, midline shift, or extra-axial fluid collection. Hypodensities in the cerebral white matter bilaterally are unchanged and nonspecific but compatible with mild chronic small  vessel ischemic disease. There is mild cerebral atrophy. Vascular: Calcified atherosclerosis at the skull base. No hyperdense vessel. Skull: No acute fracture or suspicious osseous lesion. Sinuses/Orbits: Chronic medial right orbital fracture. Visualized paranasal sinuses and mastoid air cells are clear. Bilateral cataract extraction. Other: None. IMPRESSION: 1. No evidence of acute intracranial abnormality. 2. Mild chronic small vessel ischemic disease. Electronically Signed   By: Logan Bores M.D.   On: 11/26/2022 15:35    Procedures Procedures    Medications Ordered in ED Medications  vancomycin (VANCOCIN) capsule 125 mg (125 mg Oral Given 11/26/22 1613)  sodium chloride 0.9 % bolus 500 mL (0 mLs Intravenous Stopped 11/26/22 1613)  lidocaine (XYLOCAINE) 2 % jelly 1 Application (1 Application Urethral Given 11/26/22 1823)    ED Course/ Medical Decision Making/ A&P Clinical Course as of 11/26/22 1850  Wed Nov 26, 2022  1510 In depth conversation held with patient family regarding lab findings. Discussed with patient family regarding plans for admission due to positive C. Diff lab finding. Answered all available questions. Family agreeable to admission plans.  [SB]  1836 Attempted coude catheter with Attending, Dr. Pearline Cables and unsuccessful.  [SB]   [phone] Consult with GI, Dr. Vevelyn Francois who recommends vancomycin and will be available for consult as needed.  [SB]  1850 Consult with urology by attending, Dr. Pearline Cables who notes that urology will come to bedside to evaluate patient. [SB]    Clinical Course User Index [SB] Adelynn Gipe A, PA-C                           Medical Decision Making Amount and/or Complexity of Data Reviewed Labs: ordered. Radiology: ordered.  Risk Prescription drug management.   Patient presents to the emergency department with concerns for diarrhea that has been ongoing as well as generalized weakness for the past week.  Patient recently had a positive C. difficile test completed by his GI doctor.  Was started on vancomycin p.o. yesterday.  Patient afebrile, not tachycardic or hypoxic.  On exam patient without acute cardiovascular, respiratory, dull exam findings.  Differential diagnosis includes C. difficile, hypoglycemia, arrhythmia, intracranial abnormality, electrolyte abnormality, anemia.   Additional history obtained:  Additional history obtained from Daughter/Son  Labs:  I ordered, and personally interpreted labs.  The pertinent results include:  CBC without leukocytosis, hemoglobin at 10.8 however improved from previous value. CMP without acute findings at this time. Lipase negative. Urinalysis ordered with results pending at time of signout. CBG at 118  Imaging: I ordered imaging studies including CT head without I independently visualized and interpreted imaging which showed no acute intracranial abnormality I agree with the radiologist interpretation  Medications:  I ordered medication including vancomycin, IV fluids for antibiotic treatment. I have reviewed the patients home medicines and have made adjustments as needed   Consultations: I requested consultation with the Gastroenterologist, Dr. Jenetta Downer and discussed lab and imaging findings as well as pertinent plan - they recommend:  Vancomycin and will be available for consult as needed.  Patient case discussed with Pati Gallo, PA-C at sign-out. Plan at sign-out is pending Urology consult, likely Admission to the hospital, however, plans may change as per oncoming team. Patient care transferred at sign out.   This chart was dictated using voice recognition software, Dragon. Despite the best efforts of this provider to proofread and correct errors, errors may still occur which can change documentation meaning. Final Clinical Impression(s) / ED Diagnoses Final diagnoses:  Generalized weakness  C. difficile diarrhea  Acute urinary retention    Rx / DC Orders ED Discharge Orders     None         Orlondo Holycross A, PA-C 11/26/22 1851    Tretha Sciara, MD 11/27/22 1836

## 2022-11-26 NOTE — Assessment & Plan Note (Addendum)
Observation med/surg bed. Continue po vancomycin. GI consult. Check for C. Diff toxin.

## 2022-11-26 NOTE — Consult Note (Addendum)
Consultation: urinary retention, meatal stricture Requested by: Dr. Eric Chen  History of Present Illness: Jared Bridges is a 88 yo being admitted for c diff diarrhea. He had painful ability to urinate tonight with ~ 600 in bladder on bladder scan. Nurse and MD could only get in ~ 2mm. He follows at [facility] and [facility] noted a meatal sx they dilated in office Oct 2023.   He was dx with metastatic prostate cancer in July 2021 with Dr. McKenzie. He was started on ADT and Xtandi in 2021. XRT in 2022. He was taking finasteride and alfuzosin. His Oct 2023 PSA was 0.01, T < 10.     Past Medical History:  Diagnosis Date   Acute cholecystitis 04/05/2016   Anemia, iron deficiency    Chronic headaches    Migraines   Chronic kidney disease    Resolved   Depression    Diverticulitis    Essential hypertension    GI bleed    Dr. Rehman - 1998   Hypercholesteremia    Orthostatic hypotension    Peptic ulcer disease    Peripheral neuropathy    Polymyalgia rheumatica ([name]    Rectal bleeding 11/26/2017   Added automatically from request for surgery [id number]   Stroke ([name]    Residual trouble reading and writing   Syncope    Thyroid nodule    TIA (transient ischemic attack) 12/2017   Type 2 diabetes mellitus ([name]    Upper GI bleed 06/18/2012   Past Surgical History:  Procedure Laterality Date   APPENDECTOMY     as child   BACK SURGERY     2000 IDET SPINAL PROC   CHOLECYSTECTOMY N/A 04/07/2016   Procedure: CHOLECYSTECTOMY;  Surgeon: Mark Jenkins, MD;  Location: [facility];  Service: General;  Laterality: N/A;   COLON SURGERY     FOR DIVERTICULOSIS   1988   COLONOSCOPY N/A 01/07/2018   Procedure: COLONOSCOPY;  Surgeon: Rehman, Najeeb U, MD;  Location: [facility];  Service: Endoscopy;  Laterality: N/A;  1:00   COLONOSCOPY WITH PROPOFOL N/A 08/12/2022   Procedure: COLONOSCOPY WITH PROPOFOL;  Surgeon: Castaneda Mayorga, Daniel, MD;  Location: [facility];  Service: Gastroenterology;  Laterality: N/A;  ASA 3    CRYOTHERAPY     ESOPHAGOGASTRODUODENOSCOPY (EGD) WITH PROPOFOL N/A 08/09/2022   Procedure: ESOPHAGOGASTRODUODENOSCOPY (EGD) WITH PROPOFOL;  Surgeon: Castaneda Mayorga, Daniel, MD;  Location: [facility];  Service: Gastroenterology;  Laterality: N/A;   EYE SURGERY     right cataract with lens implant   FEMUR SURGERY     RT LEG        1953   GIVENS CAPSULE STUDY N/A 08/13/2022   Procedure: GIVENS CAPSULE STUDY;  Surgeon: Castaneda Mayorga, Daniel, MD;  Location: [facility];  Service: Gastroenterology;  Laterality: N/A;   HAND SURGERY     RIGHT   HERNIA REPAIR     1-right inguinal, 3- left inguinal   IRRIGATION AND DEBRIDEMENT KNEE Left 07/09/2015   Procedure: LEFT KNEE IRRIGATION AND DEBRIDEMENT WOUND CLOSURE;  Surgeon: Frank Aluisio, MD;  Location: [facility];  Service: Orthopedics;  Laterality: Left;   JOINT REPLACEMENT  02/28/2016   lt knee revision   KNEE DEBRIDEMENT     2016   LEFT   MENISCUS REPAIR     LEFT   POLYPECTOMY  01/07/2018   Procedure: POLYPECTOMY;  Surgeon: Rehman, Najeeb U, MD;  Location: [facility];  Service: Endoscopy;;  colon    POLYPECTOMY  08/12/2022     Procedure: POLYPECTOMY;  Surgeon: Montez Morita, Quillian Quince, MD;  Location: [facility];  Service: Gastroenterology;;   SHOULDER OPEN ROTATOR CUFF REPAIR     RT SHOULDER   SPINAL FUSION     SPINAL FUSION     TONSILLECTOMY     TOTAL KNEE ARTHROPLASTY Left 05/28/2015   Procedure: LEFT TOTAL KNEE ARTHROPLASTY;  Surgeon: Gaynelle Arabian, MD;  Location: [facility];  Service: Orthopedics;  Laterality: Left;   TOTAL KNEE REVISION Left 02/28/2016   Procedure: LEFT TOTAL KNEE REVISION;  Surgeon: Leandrew Koyanagi, MD;  Location: [facility];  Service: Orthopedics;  Laterality: Left;   TRIGGER FINGER RELEASE     RT HAND   1990S   UVULECTOMY  2004   TURBINATE,TONSIL,ADENOID    Home Medications:  (Not in a hospital admission)  Allergies:  Allergies  Allergen Reactions   Iodine Hives, Itching and Other (See Comments)    very  allergic(per daughter), can pre-med with benadryl   Calan [Verapamil] Other (See Comments)    Weakness   Cymbalta [Duloxetine Hcl] Other (See Comments)    Makes patient have jerking motions.   Iodinated Contrast Media Hives, Itching and Other (See Comments)    Can pre-med with benadryl   Latex Other (See Comments)    Redness iritation   Lisinopril Other (See Comments)    Makes patient have jerking motions.   Neurontin [Gabapentin] Other (See Comments)    Makes patient have jerking motions.   Zoloft [Sertraline Hcl] Diarrhea and Other (See Comments)    Makes patient have jerking motions.   Valium [Diazepam] Other (See Comments)    "Did not work"   Flomax [Tamsulosin Hcl] Other (See Comments)    Dizziness   Glimepiride Other (See Comments)    "Did not work"   Melatonin Nausea Only   Tape Other (See Comments)    Skin irritation    Family History  Problem Relation Age of Onset   Colon cancer Sister    Hypertension Other    Depression Brother    Depression Daughter    Social History:  reports that he quit smoking about 36 years ago. His smoking use included cigars and cigarettes. He started smoking about 63 years ago. He has a 1.80 pack-year smoking history. He has never used smokeless tobacco. He reports that he does not drink alcohol and does not use drugs.  ROS: A complete review of systems was performed.  All systems are negative except for pertinent findings as noted. Review of Systems  Unable to perform ROS: Mental acuity     Physical Exam:  Vital signs in last 24 hours: Temp:  [97.7 F (36.5 C)] 97.7 F (36.5 C) (12/27 1600) Pulse Rate:  [55-78] 66 (12/27 1715) Resp:  [13-22] 21 (12/27 1730) BP: (103-157)/(53-81) 157/71 (12/27 1730) SpO2:  [92 %-98 %] 98 % (12/27 1715) Weight:  [77.1 kg] 77.1 kg (12/27 1242) General:  Alert and oriented, No acute distress HEENT: Normocephalic, atraumatic Cardiovascular: Regular rate and rhythm Lungs: Regular rate and  effort Abdomen: Soft, nontender, nondistended, no abdominal masses Back: No CVA tenderness Extremities: No edema Neurologic: Grossly intact GU: Penis uncircumcised but no phimosis.  The glans and meatus appear normal.  Procedure: Patient prepped and draped in the usual sterile fashion.  I passed a 4 [place] Foley which only went in to the fossa navicularis and no further.  Therefore I took a sensor wire and passed that into the urethra without difficulty.  I noted I could only get  the wire about to the prostate and no further.  I dilated the fossa to 20 [place] without difficulty.  I then passed a 78 [place] Foley but it met resistance at the membranous urethra/prostate.  Therefore I took an 42 [place] coud and passed that and it also met resistance.  Therefore I passed the flexible cystoscope and noted another stricture at the membranous urethra.  I advanced a sensor wire under direct vision which went without difficulty and seem to coil in the bladder.  I then backed the scope out and using the renal dilator set I passed the inner stylette and then over that passed 12 through 16 dilator.  I tried to pass the 18 but did not like how it was sliding over the sensor wire and it also seemed to me the sensor wire backed out slightly.  To be certain I had a good access I remove the sensor wire and repassed the cystoscope and then passed a zip wire.  The zip wire also went without difficulty.  I then passed an 61 and 20 [place] dilator and these went much easier over the zip wire.  Again urine returned with each pass.  These went without difficulty although the membranous stricture was dense.  I then remove the inner cannula off the zip wire and passed a 16 [place] council tip catheter which went without difficulty.  The balloon was inflated and seated at the bladder neck and the wire was removed.  The catheter was connected to gravity drainage.  He drained a good bit of urine with each dilator passage and then drained  500 cc into the bag.  Urine was clear but cloudy.  Asst: EDP, PA Wy[place] and Nurse [employer] Data:  Results for orders placed or performed during the hospital encounter of 11/26/22 (from the past 24 hour(s))  CBC with Differential     Status: Abnormal   Collection Time: 11/26/22  1:17 PM  Result Value Ref Range   WBC 6.9 4.0 - 10.5 K/uL   RBC 4.01 (L) 4.22 - 5.81 MIL/uL   Hemoglobin 10.8 (L) 13.0 - 17.0 g/dL   HCT 36.3 (L) 39.0 - 52.0 %   MCV 90.5 80.0 - 100.0 fL   MCH 26.9 26.0 - 34.0 pg   MCHC 29.8 (L) 30.0 - 36.0 g/dL   RDW 15.6 (H) 11.5 - 15.5 %   Platelets 259 150 - 400 K/uL   nRBC 0.0 0.0 - 0.2 %   Neutrophils Relative % 80 %   Neutro Abs 5.6 1.7 - 7.7 K/uL   Lymphocytes Relative 9 %   Lymphs Abs 0.6 (L) 0.7 - 4.0 K/uL   Monocytes Relative 6 %   Monocytes Absolute 0.4 0.1 - 1.0 K/uL   Eosinophils Relative 4 %   Eosinophils Absolute 0.3 0.0 - 0.5 K/uL   Basophils Relative 0 %   Basophils Absolute 0.0 0.0 - 0.1 K/uL   Immature Granulocytes 1 %   Abs Immature Granulocytes 0.09 (H) 0.00 - 0.07 K/uL  Comprehensive metabolic panel     Status: Abnormal   Collection Time: 11/26/22  1:17 PM  Result Value Ref Range   Sodium 138 135 - 145 mmol/L   Potassium 4.0 3.5 - 5.1 mmol/L   Chloride 104 98 - 111 mmol/L   CO2 27 22 - 32 mmol/L   Glucose, Bld 111 (H) 70 - 99 mg/dL   BUN 16 8 - 23 mg/dL   Creatinine, Ser 0.85 0.61 - 1.24  mg/dL   Calcium 9.4 8.9 - 10.3 mg/dL   Total Protein 6.9 6.5 - 8.1 g/dL   Albumin 2.9 (L) 3.5 - 5.0 g/dL   AST 16 15 - 41 U/L   ALT 8 0 - 44 U/L   Alkaline Phosphatase 61 38 - 126 U/L   Total Bilirubin 0.7 0.3 - 1.2 mg/dL   GFR, Estimated >60 >60 mL/min   Anion gap 7 5 - 15  Lipase, blood     Status: None   Collection Time: 11/26/22  1:17 PM  Result Value Ref Range   Lipase 23 11 - 51 U/L  CBG monitoring, ED     Status: Abnormal   Collection Time: 11/26/22  1:24 PM  Result Value Ref Range   Glucose-Capillary 118 (H) 70 - 99 mg/dL    Recent Results (from the past 240 hour(s))  Clostridium difficile Toxin B, Qualitative, Real-Time PCR     Status: Abnormal   Collection Time: 11/21/22 11:22 AM  Result Value Ref Range Status   Toxigenic C. Difficile by PCR DETECTED (A) NOT DETECTED Final    Comment: . The stool sample is POSITIVE for toxigenic C. difficile.  This result is suggestive of C. difficile infection  (CDI) if accompanied by appropriate clinical symptoms. . Simultaneous testing does not identify a genetic marker of the hypervirulent 027/NAP1/BI strain of toxigenic  C. difficile. . This test is for use only with liquid or soft stools;  performance characteristics of other clinical specimen  types have not been established. . This assay was performed by Cepheid GeneXpert(R) PCR. The performance characteristics of this assay have been determined by [employer]. Performance characteristics refer to the analytical performance of the test. . For additional information, please refer to http://education.[facility].com/faq/FAQ136 (This link is being provided for informational/educational purposes only.) .    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  Okay to remove early one morning. -I covered him with the cephalexin x 2 doses for the catheter placement.  He follows with [facility].   Festus Aloe 11/26/2022, 7:55 PM

## 2022-11-27 ENCOUNTER — Encounter (HOSPITAL_COMMUNITY): Payer: Self-pay | Admitting: Internal Medicine

## 2022-11-27 DIAGNOSIS — R531 Weakness: Secondary | ICD-10-CM | POA: Diagnosis not present

## 2022-11-27 DIAGNOSIS — A0472 Enterocolitis due to Clostridium difficile, not specified as recurrent: Secondary | ICD-10-CM | POA: Diagnosis not present

## 2022-11-27 DIAGNOSIS — E860 Dehydration: Secondary | ICD-10-CM | POA: Diagnosis not present

## 2022-11-27 DIAGNOSIS — R338 Other retention of urine: Secondary | ICD-10-CM | POA: Diagnosis not present

## 2022-11-27 LAB — CBC WITH DIFFERENTIAL/PLATELET
Abs Immature Granulocytes: 0.09 10*3/uL — ABNORMAL HIGH (ref 0.00–0.07)
Basophils Absolute: 0 10*3/uL (ref 0.0–0.1)
Basophils Relative: 0 %
Eosinophils Absolute: 0.2 10*3/uL (ref 0.0–0.5)
Eosinophils Relative: 1 %
HCT: 36.2 % — ABNORMAL LOW (ref 39.0–52.0)
Hemoglobin: 10.9 g/dL — ABNORMAL LOW (ref 13.0–17.0)
Immature Granulocytes: 1 %
Lymphocytes Relative: 4 %
Lymphs Abs: 0.5 10*3/uL — ABNORMAL LOW (ref 0.7–4.0)
MCH: 27.3 pg (ref 26.0–34.0)
MCHC: 30.1 g/dL (ref 30.0–36.0)
MCV: 90.5 fL (ref 80.0–100.0)
Monocytes Absolute: 0.8 10*3/uL (ref 0.1–1.0)
Monocytes Relative: 6 %
Neutro Abs: 11 10*3/uL — ABNORMAL HIGH (ref 1.7–7.7)
Neutrophils Relative %: 88 %
Platelets: 222 10*3/uL (ref 150–400)
RBC: 4 MIL/uL — ABNORMAL LOW (ref 4.22–5.81)
RDW: 15.6 % — ABNORMAL HIGH (ref 11.5–15.5)
WBC: 12.6 10*3/uL — ABNORMAL HIGH (ref 4.0–10.5)
nRBC: 0 % (ref 0.0–0.2)

## 2022-11-27 LAB — COMPREHENSIVE METABOLIC PANEL
ALT: 9 U/L (ref 0–44)
AST: 15 U/L (ref 15–41)
Albumin: 2.6 g/dL — ABNORMAL LOW (ref 3.5–5.0)
Alkaline Phosphatase: 53 U/L (ref 38–126)
Anion gap: 7 (ref 5–15)
BUN: 17 mg/dL (ref 8–23)
CO2: 29 mmol/L (ref 22–32)
Calcium: 9.5 mg/dL (ref 8.9–10.3)
Chloride: 105 mmol/L (ref 98–111)
Creatinine, Ser: 0.87 mg/dL (ref 0.61–1.24)
GFR, Estimated: >60 mL/min (ref >60)
Glucose, Bld: 113 mg/dL — ABNORMAL HIGH (ref 70–99)
Potassium: 4.7 mmol/L (ref 3.5–5.1)
Sodium: 141 mmol/L (ref 135–145)
Total Bilirubin: 0.7 mg/dL (ref 0.3–1.2)
Total Protein: 6.2 g/dL — ABNORMAL LOW (ref 6.5–8.1)

## 2022-11-27 LAB — CBC
HCT: 37.1 % — ABNORMAL LOW (ref 39.0–52.0)
Hemoglobin: 11.3 g/dL — ABNORMAL LOW (ref 13.0–17.0)
MCH: 27.5 pg (ref 26.0–34.0)
MCHC: 30.5 g/dL (ref 30.0–36.0)
MCV: 90.3 fL (ref 80.0–100.0)
Platelets: 256 10*3/uL (ref 150–400)
RBC: 4.11 MIL/uL — ABNORMAL LOW (ref 4.22–5.81)
RDW: 15.6 % — ABNORMAL HIGH (ref 11.5–15.5)
WBC: 13.4 10*3/uL — ABNORMAL HIGH (ref 4.0–10.5)
nRBC: 0 % (ref 0.0–0.2)

## 2022-11-27 MED ORDER — LACTATED RINGERS IV SOLN: INTRAVENOUS | Status: AC

## 2022-11-27 NOTE — TOC Initial Note (Signed)
Transition of Care [facility]) - Initial/Assessment Note    Patient Details  Name: Jared Bridges MRN: [id number] Date of Birth: Oct 14, 1930  Transition of Care [facility]) CM/SW Contact:    Ihor Gully, LCSW Phone Number: 11/27/2022, 4:31 PM  Clinical Narrative:                 Patient from home with spouse and daughter. Admitted for C.diff. uses motor chair, and sara steady. Daughter rolls patient for changes. Recently discharged from [facility]. Recommended for SNF. Only wants [facility]. P[facility] unable to accept at this time. Daughter notified. Agreeable to [facility] with [facility] again. Clarise Cruz with [facility] notified.   Expected Discharge Plan: [place] Barriers to Discharge: Continued Medical Work up   Patient Goals and CMS Choice Patient states their goals for this hospitalization and ongoing recovery are:: home with [facility]          Expected Discharge Plan and Services                                   HH Arranged: PT, RN [place] Agency: [place] Date [place]: 11/27/22 Time HH Agency Contacted: [phone] Representative spoke with at [place]: [place] Arrangements/Services     Patient language and need for interpreter reviewed:: Yes Do you feel safe going back to the place where you live?: Yes      Need for Family Participation in Patient Care: Yes (Comment) Care giver support system in place?: Yes (comment)   Criminal Activity/Legal Involvement Pertinent to Current Situation/Hospitalization: No - Comment as needed  Activities of Daily Living Home Assistive Devices/Equipment: Electric scooter ADL Screening (condition at time of admission) Patient's cognitive ability adequate to safely complete daily activities?: No Is the patient deaf or have difficulty hearing?: No Does the patient have difficulty seeing, even when wearing glasses/contacts?: No Does the patient have difficulty concentrating, remembering, or making decisions?:  Yes Patient able to express need for assistance with ADLs?: Yes Does the patient have difficulty dressing or bathing?: Yes Independently performs ADLs?: No Communication: Appropriate for developmental age Dressing (OT): Dependent Is this a change from baseline?: Pre-admission baseline Grooming: Dependent Is this a change from baseline?: Pre-admission baseline Feeding: Independent Bathing: Dependent Is this a change from baseline?: Pre-admission baseline Toileting: Dependent Is this a change from baseline?: Pre-admission baseline In/Out Bed: Dependent Is this a change from baseline?: Pre-admission baseline Walks in Home: Dependent Is this a change from baseline?: Pre-admission baseline Does the patient have difficulty walking or climbing stairs?: Yes Weakness of Legs: Both Weakness of Arms/Hands: Both  Permission Sought/Granted Permission sought to share information with : Family Supports    Share Information with NAME: daughter, [name]           Emotional Assessment Appearance:: Appears younger than stated age   Affect (typically observed): Appropriate   Alcohol / Substance Use: Not Applicable Psych Involvement: No (comment)  Admission diagnosis:  C. difficile diarrhea [A04.72] Generalized weakness [R53.1] Acute urinary retention [R33.8] Patient Active Problem List   Diagnosis Date Noted   Generalized weakness 11/27/2022   C. difficile diarrhea 11/26/2022   Acute urinary retention 11/26/2022   Decreased appetite 11/22/2022   Atelectasis 08/10/2022   Iron deficiency anemia secondary to blood loss (chronic) 08/08/2022   Grade I diastolic dysfunction 37/85/8850   Hyperlipidemia associated with type 2 diabetes mellitus ([place]) 06/26/2022   Hypertension associated  with type 2 diabetes mellitus ([place]) 06/26/2022   History of CVA (cerebrovascular accident) 06/26/2022   Vascular dementia without behavioral disturbance ([name] 06/26/2022   Neurodegenerative cognitive impairment  ([place]) 06/26/2022   Normochromic normocytic anemia    Mixed hyperlipidemia 06/16/2022   Chronic diastolic CHF (congestive heart failure) ([name] 06/16/2022   GERD (gastroesophageal reflux disease) 06/16/2022   Loss of weight 03/10/2022   History of peptic ulcer disease 09/03/2021   Wheelchair dependence 09/02/2021   Quadriplegia ([place]) 09/02/2021   Sleep disturbance    Controlled type 2 diabetes mellitus with hyperglycemia, without long-term current use of insulin ([name]    Central cord syndrome at C7 level of cervical spinal cord ([name] 07/05/2021   Epidural hematoma ([name] 07/02/2021   Lower urinary tract symptoms (LUTS) 10/31/2020   Prostate cancer ([place]) 06/20/2020   Elevated PSA 04/18/2020   Cobalamin deficiency 02/22/2020   Intractable chronic migraine without aura 02/20/2020   Altered mental status 02/20/2020   Asthenia 02/20/2020   Diabetic peripheral neuropathy ([place]) 02/20/2020   Obstructive sleep apnea of adult 02/20/2020   Medicare annual wellness visit, subsequent 12/07/2019   Depressed mood 12/07/2019   Vitamin D deficiency 10/25/2019   Memory loss 10/25/2019   Fall 10/25/2019   Nonrheumatic aortic valve stenosis 03/23/2018   Migraine headaches 12/29/2017   TIA (transient ischemic attack) 12/28/2017   Carpal tunnel syndrome, left 07/22/2017   Acquired trigger finger 07/22/2017   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   Total knee replacement status 02/28/2016   CVA (cerebral infarction) 12/19/2015   Carotid stenosis 12/19/2015   Wound infection after surgery 06/29/2015   OA (osteoarthritis) of knee 05/28/2015   Orthostatic hypotension 11/21/2014   Dizziness 11/16/2014   Polymyalgia rheumatica ([place]) 09/12/2014   Dehydration 08/16/2014   Iron deficiency anemia due to chronic blood loss 08/16/2014   DJD (degenerative joint disease) 08/16/2014   Essential hypertension    Cataract, nuclear 04/11/2014   Complaining of back-related symptom 03/15/2014    Arthralgia of hip or thigh 03/15/2014   Gonalgia 10/10/2013   Left knee pain 10/10/2013   1st degree AV block 08/04/2013   Infection of the upper respiratory tract 04/05/2013   Cataract 02/24/2013   Pseudoaphakia 02/24/2013   Episode of syncope 01/11/2013   Cardiac murmur 11/09/2012   Near syncope 11/09/2012   Anxiety 06/18/2012   Depression, recurrent ([place]) 06/18/2012   Difficulty hearing 06/18/2012   Adaptive colitis 06/18/2012   Arthritis, degenerative 06/18/2012   Hearing impairment 06/18/2012   Multinodular goiter 01/29/2012   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 [place] [address] Phone: [phone] Fax: [place], [place] [address] Phone: [phone] Fax: [phone]     Social Determinants of Health (SDOH) Social History: SDOH Screenings   Food Insecurity: No Food Insecurity (11/27/2022)  Housing: Low Risk  (11/27/2022)  Transportation Needs: No Transportation Needs (11/27/2022)  Utilities: At Risk (11/27/2022)  Depression (PHQ2-9): Low Risk  (01/03/2021)  Tobacco Use: Medium Risk (11/27/2022)   SDOH Interventions: Housing Interventions: Intervention Not Indicated   Readmission  Risk Interventions    06/23/2022    1:26 PM  Readmission Risk Prevention Plan  Transportation Screening Complete  HRI or Home Care Consult Complete  Social Work Consult for [place] Planning/Counseling Complete  Palliative Care Screening Not Applicable  Medication Review Press photographer) Complete

## 2022-11-27 NOTE — Progress Notes (Addendum)
Gave whole capsule of vanc in ice cream and took the medication with out difficulty, and patient is eating a biscuit that daughter brought without diffuculty. Two home meds brought by family.  Bagged and supervisor notified to take to pharmacy

## 2022-11-27 NOTE — Progress Notes (Signed)
PROGRESS NOTE    Jared Bridges  VZD:[id number] DOB: January 13, 1930 DOA: 11/26/2022 PCP: [name], MD    Brief Narrative:  88 year old white male history of hypertension, orthostatic hypotension, polymyalgia rheumatica, prior history of stroke, history of type 2 diabetes presented to hospital with diarrhea.  Patient did have a video visit with GI on 11/20/2022 but had been having diarrhea, last antibiotic dose was in August 2023.  She also had some confusion and poor oral intake.  After the video visit orders were placed for [name] and GI pathogen panel and was notified that she was positive for C. difficile. Oral vancomycin was called into the pharmacy but unknown if she actually started show the patient presented to the ED.  In the ED, patient had stable vitals.  WBC was 6.9.  Lab otherwise within normal range.  CT head showed a small vessel ischemic changes. Patient was then admitted hospital for further evaluation and treatment.  Assessment and Plan:  * C. difficile diarrhea WBC today at 12.6.  Repeat C. difficile has been sent.  Continue oral vancomycin.  GI has seen the patient and recommend vancomycin orally every 6hr for total of 10 days.  Acute urinary retention, history of metastatic prostate cancer Unable to put Foley as per ED provider.  Urology was consulted and patient underwent Foley catheter placement for meatal and urethral stricture.  Urology Dr. Junious Silk recommends continuation of Foley catheter for 7 days.  Continue finasteride.  Patient follows up with urology at [facility].  Patient's daughter states that she might want to follow-up with urology at [facility] but will let us know.  Essential hypertension Lasix on hold due to volume depletion/ dehydration.  Reconsider when volume status adequate.   Dehydration Received IV hydration overnight.  On Clears.  Encourage oral intake.  Will advance diet to to soft diet today.   Memory loss Unclear about dementia.  Continue  supportive care.   Debility, deconditioning.  Will get PT evaluation.  Does have a wheelchair at home.     DVT prophylaxis: SCDs Start: 11/26/22 2148   Code Status:     Code Status: Full Code  Disposition:  Likely home in 1 to 2 days with home health.  Patient previously had home health at home.  Pending PT evaluation  Status is: Observation  The patient will require care spanning > 2 midnights and should be moved to inpatient because: C. difficile diarrhea, volume depletion, IV fluids, oral vancomycin, acute urinary retention on Foley catheter.   Family Communication:  Spoke with the patient's daughter on the phone and updated her about the clinical condition of the patient.  Consultants:  GI Urology  Procedures:  Foley catheter placement  Antimicrobials:  Oral vancomycin.  Anti-infectives (From admission, onward)    Start     Dose/Rate Route Frequency Ordered Stop   11/26/22 2200  cephALEXin (KEFLEX) capsule 500 mg        500 mg Oral Every 8 hours 11/26/22 2135 11/27/22 1359   11/26/22 1600  vancomycin (VANCOCIN) capsule 125 mg        125 mg Oral 4 times daily 11/26/22 1450 12/06/22 1759      Subjective: Today, patient was seen and examined at bedside.  Patient states he did not have  bowel movement today.  No nausea vomiting or pain reported.  Objective: Vitals:   11/27/22 1030 11/27/22 1100 11/27/22 1130 11/27/22 1200  BP: 103/85 (!) 143/58 (!) 148/61 (!) 166/68  Pulse: 66 62 80  65  Resp:      Temp:      TempSrc:      SpO2: 93% 92% 97% 94%  Weight:      Height:        Intake/Output Summary (Last 24 hours) at 11/27/2022 1231 Last data filed at 11/27/2022 0920 Gross per 24 hour  Intake 1500 ml  Output --  Net 1500 ml   Filed Weights   11/26/22 1242  Weight: 77.1 kg    Physical Examination: Body mass index is 21.83 kg/m.   General:  Average built, not in obvious distress, alert awake and Communicative, HENT:   No scleral pallor or icterus  noted. Oral mucosa is slightly dry. Chest:  Clear breath sounds.  Diminished breath sounds bilaterally. No crackles or wheezes.  CVS: S1 &S2 heard. No murmur.  Regular rate and rhythm. Abdomen: Soft, nontender, nondistended.  Bowel sounds are heard.  Foley catheter in place. Extremities: No cyanosis, clubbing or edema.  Peripheral pulses are palpable. Psych: Alert, awake and Communicative, CNS:  No cranial nerve deficits.  Power equal in all extremities.   Skin: Warm and dry.  No rashes noted.  Data Reviewed:   CBC: Recent Labs  Lab 11/26/22 1317 11/27/22 0500  WBC 6.9 12.6*  NEUTROABS 5.6 11.0*  HGB 10.8* 10.9*  HCT 36.3* 36.2*  MCV 90.5 90.5  PLT 259 169    Basic Metabolic Panel: Recent Labs  Lab 11/26/22 1317 11/27/22 0500  NA 138 141  K 4.0 4.7  CL 104 105  CO2 27 29  GLUCOSE 111* 113*  BUN 16 17  CREATININE 0.85 0.87  CALCIUM 9.4 9.5    Liver Function Tests: Recent Labs  Lab 11/26/22 1317 11/27/22 0500  AST 16 15  ALT 8 9  ALKPHOS 61 53  BILITOT 0.7 0.7  PROT 6.9 6.2*  ALBUMIN 2.9* 2.6*     Radiology Studies: CT Head Wo Contrast  Result Date: 11/26/2022 CLINICAL DATA:  Mental status change, unknown cause. EXAM: CT HEAD WITHOUT CONTRAST TECHNIQUE: Contiguous axial images were obtained from the base of the skull through the vertex without intravenous contrast. RADIATION DOSE REDUCTION: This exam was performed according to the departmental dose-optimization program which includes automated exposure control, adjustment of the mA and/or kV according to patient size and/or use of iterative reconstruction technique. COMPARISON:  Head CT 06/16/2022 and MRI 07/01/2021 FINDINGS: Brain: There is no evidence of an acute infarct, intracranial hemorrhage, mass, midline shift, or extra-axial fluid collection. Hypodensities in the cerebral white matter bilaterally are unchanged and nonspecific but compatible with mild chronic small vessel ischemic disease. There is mild  cerebral atrophy. Vascular: Calcified atherosclerosis at the skull base. No hyperdense vessel. Skull: No acute fracture or suspicious osseous lesion. Sinuses/Orbits: Chronic medial right orbital fracture. Visualized paranasal sinuses and mastoid air cells are clear. Bilateral cataract extraction. Other: None. IMPRESSION: 1. No evidence of acute intracranial abnormality. 2. Mild chronic small vessel ischemic disease. Electronically Signed   By: Logan Bores M.D.   On: 11/26/2022 15:35      LOS: 0 days    Flora Lipps, MD [facility] Available via Epic secure chat 7am-7pm After these hours, please refer to coverage provider listed on [email or web address] 11/27/2022, 12:31 PM

## 2022-11-27 NOTE — Progress Notes (Signed)
Alert to self and tries to state birthday but unable to be understood well.  Multiple skin tears and stage 2 pressure wounds.  Foam dressings applied and heal protector dressings applied to heels.  Vitals stable .  Daughter was contacted and she stated he is dependent for all ADLs and that he is able to transfer him to electric scooter to go to doctor appointments.  No diarrhea thus far.

## 2022-11-27 NOTE — NC FL2 (Signed)
[name] LEVEL OF CARE FORM     IDENTIFICATION  Patient Name: Jared Bridges Birthdate: 1930/08/25 Sex: male Admission Date (Current Location): 11/26/2022  [name] and [place] Number:  [employer] and Address:  [name] [address] [name]      Provider Number: [id number]  Attending Physician Name and Address:  Flora Lipps, MD  Relative Name and Phone Number:  Yoshimi, Sarr (Daughter) [phone]    Current Level of Care: Hospital (obs) Recommended Level of Care: [place] Prior Approval Number:    Date Approved/Denied:   PASRR Number: [id number]  Discharge Plan: SNF    Current Diagnoses: Patient Active Problem List   Diagnosis Date Noted   Generalized weakness 11/27/2022   C. difficile diarrhea 11/26/2022   Acute urinary retention 11/26/2022   Decreased appetite 11/22/2022   Atelectasis 08/10/2022   Iron deficiency anemia secondary to blood loss (chronic) 08/08/2022   Grade I diastolic dysfunction 15/17/6160   Hyperlipidemia associated with type 2 diabetes mellitus ([name]) 06/26/2022   Hypertension associated with type 2 diabetes mellitus ([name]) 06/26/2022   History of CVA (cerebrovascular accident) 06/26/2022   Vascular dementia without behavioral disturbance ([name]) 06/26/2022   Neurodegenerative cognitive impairment ([name]) 06/26/2022   Normochromic normocytic anemia    Mixed hyperlipidemia 06/16/2022   Chronic diastolic CHF (congestive heart failure) ([name]) 06/16/2022   GERD (gastroesophageal reflux disease) 06/16/2022   Loss of weight 03/10/2022   History of peptic ulcer disease 09/03/2021   Wheelchair dependence 09/02/2021   Quadriplegia ([name]) 09/02/2021   Sleep disturbance    Controlled type 2 diabetes mellitus with hyperglycemia, without long-term current use of insulin ([name]    Central cord syndrome at C7 level of cervical spinal cord ([name] 07/05/2021   Epidural hematoma ([name]  07/02/2021   Lower urinary tract symptoms (LUTS) 10/31/2020   Prostate cancer ([name]) 06/20/2020   Elevated PSA 04/18/2020   Cobalamin deficiency 02/22/2020   Intractable chronic migraine without aura 02/20/2020   Altered mental status 02/20/2020   Asthenia 02/20/2020   Diabetic peripheral neuropathy ([name]) 02/20/2020   Obstructive sleep apnea of adult 02/20/2020   Medicare annual wellness visit, subsequent 12/07/2019   Depressed mood 12/07/2019   Vitamin D deficiency 10/25/2019   Memory loss 10/25/2019   Fall 10/25/2019   Nonrheumatic aortic valve stenosis 03/23/2018   Migraine headaches 12/29/2017   TIA (transient ischemic attack) 12/28/2017   Carpal tunnel syndrome, left 07/22/2017   Acquired trigger finger 07/22/2017   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/28/2016   Total knee replacement status 02/28/2016   CVA (cerebral infarction) 12/19/2015   Carotid stenosis 12/19/2015   Wound infection after surgery 06/29/2015   OA (osteoarthritis) of knee 05/28/2015   Orthostatic hypotension 11/21/2014   Dizziness 11/16/2014   Polymyalgia rheumatica ([name]) 09/12/2014   Dehydration 08/16/2014   Iron deficiency anemia due to chronic blood loss 08/16/2014   DJD (degenerative joint disease) 08/16/2014   Essential hypertension    Cataract, nuclear 04/11/2014   Complaining of back-related symptom 03/15/2014   Arthralgia of hip or thigh 03/15/2014   Gonalgia 10/10/2013   Left knee pain 10/10/2013   1st degree AV block 08/04/2013   Infection of the upper respiratory tract 04/05/2013   Cataract 02/24/2013   Pseudoaphakia 02/24/2013   Episode of syncope 01/11/2013   Cardiac murmur 11/09/2012   Near syncope 11/09/2012   Anxiety 06/18/2012   Depression, recurrent ([name]) 06/18/2012   Difficulty hearing 06/18/2012   Adaptive colitis  06/18/2012   Arthritis, degenerative 06/18/2012   Hearing impairment 06/18/2012   Multinodular goiter 01/29/2012   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   Adenoma  of large intestine 05/31/2011   Cephalalgia 05/31/2011   Hypercholesterolemia 05/31/2011   Benign prostatic hyperplasia with urinary obstruction 05/31/2011   LBP (low back pain) 05/31/2011   Essential (primary) hypertension 05/31/2011   Headache(784.0) 05/31/2011   Hyperplasia of prostate 05/31/2011    Orientation RESPIRATION BLADDER Height & Weight     Self, Place  Normal Incontinent Weight: 170 lb (77.1 kg) Height:  '6\' 2"'$  (188 cm)  BEHAVIORAL SYMPTOMS/MOOD NEUROLOGICAL BOWEL NUTRITION STATUS      Incontinent Diet (see d/c summary)  AMBULATORY STATUS COMMUNICATION OF NEEDS Skin   Total Care Verbally Normal                       Personal Care Assistance Level of Assistance  Bathing, Feeding, Dressing Bathing Assistance: Maximum assistance Feeding assistance: Limited assistance Dressing Assistance: Maximum assistance     Functional Limitations Info  Hearing, Speech, Sight Sight Info: Adequate Hearing Info: Adequate Speech Info: Adequate    SPECIAL CARE FACTORS FREQUENCY  PT (By licensed PT)     PT Frequency: 5x/week              Contractures Contractures Info: Not present    Additional Factors Info  Code Status, Allergies, Isolation Precautions Code Status Info: Full code Allergies Info: Iodine, Calan (Verapamil), Cymbalta (Duloxetine Hcl), Iodinated Contrast Media, Latex, Lisinopril, Neurontin (Gabapentin), Zoloft (Sertraline Hcl), Valium (Diazepam), Flomax (Tamsulosin Hcl), Glimepiride, Melatonin, Tape     Isolation Precautions Info: C.diff     Current Medications (11/27/2022):  This is the current hospital active medication list Current Facility-Administered Medications  Medication Dose Route Frequency Provider Last Rate Last Admin   acetaminophen (TYLENOL) tablet 650 mg  650 mg Oral Q6H PRN Kristopher Oppenheim, DO       Or   acetaminophen (TYLENOL) suppository 650 mg  650 mg Rectal Q6H PRN Kristopher Oppenheim, DO       finasteride (PROSCAR) tablet 5 mg  5 mg Oral  Daily Kristopher Oppenheim, DO   5 mg at 11/27/22 1252   lactated ringers infusion   Intravenous Continuous Pokhrel, Laxman, MD 50 mL/hr at 11/27/22 1257 New Bag at 11/27/22 1257   ondansetron (ZOFRAN) tablet 4 mg  4 mg Oral Q6H PRN Kristopher Oppenheim, DO       Or   ondansetron [facility]) injection 4 mg  4 mg Intravenous Q6H PRN Kristopher Oppenheim, DO       pregabalin (LYRICA) capsule 75 mg  75 mg Oral BID Kristopher Oppenheim, DO   75 mg at 11/27/22 1253   topiramate (TOPAMAX) tablet 50 mg  50 mg Oral BID Kristopher Oppenheim, DO   50 mg at 11/27/22 1253   vancomycin (VANCOCIN) capsule 125 mg  125 mg Oral QID Kristopher Oppenheim, DO   125 mg at 11/27/22 1252   zolpidem (AMBIEN) tablet 5 mg  5 mg Oral QHS PRN Kristopher Oppenheim, DO       Current Outpatient Medications  Medication Sig Dispense Refill   Apremilast (OTEZLA) 30 MG TABS Take 30 mg by mouth 2 (two) times daily.     atorvastatin (LIPITOR) 40 MG tablet Take 1 tablet (40 mg total) by mouth daily. 30 tablet 0   Calcium 200 MG TABS Take 400 mg by mouth in the morning and at bedtime.     Cholecalciferol (VITAMIN D3) 250 MCG (10000 UT)  TABS Take 1,000 Units by mouth daily.     enzalutamide (XTANDI) 80 MG tablet Take 80 mg by mouth daily.     ferrous sulfate 325 (65 FE) MG tablet Take 1 tablet (325 mg total) by mouth 2 (two) times daily with a meal. Mon, wed, fri 60 tablet 1   finasteride (PROSCAR) 5 MG tablet Take 1 tablet (5 mg total) by mouth daily. 30 tablet 0   furosemide (LASIX) 20 MG tablet Take 1 tablet (20 mg total) by mouth every other day. Tuesday, Thursday and Saturday 15 tablet 0   mirtazapine (REMERON) 15 MG tablet Take 1 tablet (15 mg total) by mouth at bedtime. 30 tablet 1   pantoprazole (PROTONIX) 40 MG tablet Take 1 tablet (40 mg total) by mouth 2 (two) times daily. 60 tablet 1   pregabalin (LYRICA) 75 MG capsule Take 1 capsule (75 mg total) by mouth 2 (two) times daily. 60 capsule 0   topiramate (TOPAMAX) 50 MG tablet Take 0.5 tablets (25 mg total) by mouth 2 (two) times daily.  (Patient taking differently: Take 50 mg by mouth 2 (two) times daily.) 30 tablet 0   vitamin B-12 (CYANOCOBALAMIN) 500 MCG tablet Take 1,000 mcg by mouth daily.     zolpidem (AMBIEN) 5 MG tablet Take 5-10 mg by mouth at bedtime as needed for sleep.     vancomycin (VANCOCIN) 125 MG capsule Take 1 capsule (125 mg total) by mouth 4 (four) times daily for 10 days. (Patient not taking: Reported on 11/26/2022) 40 capsule 0     Discharge Medications: Please see discharge summary for a list of discharge medications.  Relevant Imaging Results:  Relevant Lab Results:   Additional Information SSN: [id number]  Geddy [place], Clydene Pugh, LCSW

## 2022-11-27 NOTE — Hospital Course (Signed)
88 year old white male history of hypertension, orthostatic hypotension, polymyalgia rheumatica, prior history of stroke, history of type 2 diabetes presented to hospital with diarrhea.  Patient did have a video visit with GI on 11/20/2022 but had been having diarrhea, last antibiotic dose was in August 2023.  She also had some confusion and poor oral intake.  After the video visit orders were placed for [name] and GI pathogen panel and was notified that she was positive for C. difficile. Oral vancomycin was called into the pharmacy but unknown if she actually started show the patient presented to the ED.  In the ED patient had stable vitals.  WBC was 6.9.  Lab otherwise within normal range.  CT head showed a small vessel ischemic changes. Patient was then admitted hospital for further evaluation and treatment.  Assessment and Plan:  * C. difficile diarrhea WBC today at 12.6.  Repeat C. difficile has been sent.  Continue oral vancomycin.  Acute urinary retention, history of metastatic prostate cancer Unable to put Foley as per ED provider.  Urology was consulted and patient underwent Foley catheter placement for meatal and urethral stricture.  Urology Dr. Junious Silk recommends continuation of Foley catheter for 7 days.  Essential hypertension Lasix on hold due to volume depletion dehydration.   Dehydration Received IV hydration overnight.  Clears.  Encourage oral intake.   Memory loss Unclear about dementia.  Continue supportive care.

## 2022-11-27 NOTE — Care Management Obs Status (Signed)
[place] NOTIFICATION   Patient Details  Name: Jared Bridges MRN: [id number] Date of Birth: 1930-03-03   Medicare Observation Status Notification Given:  Yes (copy mailed to address on file)    [name] 11/27/2022, 4:29 PM

## 2022-11-27 NOTE — Plan of Care (Signed)
  Problem: Acute Rehab PT Goals(only PT should resolve) Goal: Pt Will Go Supine/Side To Sit Outcome: Progressing Flowsheets (Taken 11/27/2022 1524) Pt will go Supine/Side to Sit: with moderate assist Goal: Patient Will Transfer Sit To/From Stand Outcome: Progressing Flowsheets (Taken 11/27/2022 1524) Patient will transfer sit to/from stand: with moderate assist Goal: Pt Will Transfer Bed To Chair/Chair To Bed Outcome: Progressing Flowsheets (Taken 11/27/2022 1524) Pt will Transfer Bed to Chair/Chair to Bed: with mod assist  3:24 PM, 11/27/22 Lonell Grandchild, MPT Physical Therapist with [facility] [phone] office [phone] mobile phone

## 2022-11-27 NOTE — ED Notes (Signed)
Family arrives to [facility]. Updated. Verbalizes gave immodium PTA, which may explain no BM or diarrhea in ED.

## 2022-11-27 NOTE — Consult Note (Signed)
Gastroenterology Consult   Referring Provider: No ref. provider found Primary Care Physician:  [name], MD Primary Gastroenterologist:  Dr. Jenetta Downer  Patient ID: Jared Bridges; [id number]; 18-Sep-1930   Admit date: 11/26/2022  LOS: 0 days   Date of Consultation: 11/27/2022  Reason for Consultation:  C diff    History of Present Illness   Jared Bridges is a 88 y.o. male with TIA, DM, CVA, HTN, anemia, metastatic prostrate cancer presented to ED for weakness, C diff diarrhea.   Recent virtual office visit last week for poor appetite, 20 pound weight loss, diarrhea. Outpatient stools ordered, resulted on 11/25/22 and showed Cdiff GDH antigen positive, toxin A+B negative. Toxigenic Cdiff by PCR positive. Patient's daughter notified yesterday of results and RX for vancomycin 10 days course sent in to pharmacy.   In the ED yesterday: White blood cell count 6900, hemoglobin 10.8 (up from 9.7 on 11/11/2022), sodium 138, potassium 4, BUN 16, creatinine 0.85, albumin 2.9, urinalysis with moderate leukocytes, greater than 50 RBCs, greater than 50 WBCs, large amount of hemoglobin.  No bacteria.  Urology consulted for urinary retention, Foley catheter placed.  Urology started patient on cephalexin.  No stool since admission. Today his WBC 12,600, Hgb 10.9. Albumin 2.6. Patient unable to provide history. He is alert and oriented to self only.   Colonoscopy 08/2022: - The terminal ileum appeared normal up to 20 cm from IC valve. - Four 3 to 4 mm polyps in the sigmoid colon, in the transverse colon, in the ascending colon and in the cecum, removed with a cold snare. Resected and retrieved. - Patent end-to-side colo-colonic anastomosis, characterized by healthy appearing mucosa. - The distal rectum and anal verge are normal on retroflexion view -tubular adenomas   EGD 08/2022: - 1 cm hiatal hernia. - Normal stomach. - Normal examined duodenum. - No specimens collected.  Small bowel  capsule endoscopy 08/2022: -normal appearing small bowel mucosa with some intermittent limited views due to effluent.  Prior to Admission medications   Medication Sig Start Date End Date Taking? Authorizing Provider  Apremilast (OTEZLA) 30 MG TABS Take 30 mg by mouth 2 (two) times daily.   Yes [provider]  atorvastatin (LIPITOR) 40 MG tablet Take 1 tablet (40 mg total) by mouth daily. 07/09/22  Yes Gerlene Fee, NP  Calcium 200 MG TABS Take 400 mg by mouth in the morning and at bedtime.   Yes [provider]  Cholecalciferol (VITAMIN D3) 250 MCG (10000 UT) TABS Take 1,000 Units by mouth daily.   Yes [provider]  enzalutamide (XTANDI) 80 MG tablet Take 80 mg by mouth daily.   Yes [provider]  ferrous sulfate 325 (65 FE) MG tablet Take 1 tablet (325 mg total) by mouth 2 (two) times daily with a meal. Mon, wed, fri 08/15/22  Yes Johnson, Clanford L, MD  finasteride (PROSCAR) 5 MG tablet Take 1 tablet (5 mg total) by mouth daily. 07/09/22  Yes Gerlene Fee, NP  furosemide (LASIX) 20 MG tablet Take 1 tablet (20 mg total) by mouth every other day. Tuesday, Thursday and Saturday 08/15/22 11/26/22 Yes Johnson, Clanford L, MD  mirtazapine (REMERON) 15 MG tablet Take 1 tablet (15 mg total) by mouth at bedtime. 11/20/22  Yes Carlan, [name] L, NP  pantoprazole (PROTONIX) 40 MG tablet Take 1 tablet (40 mg total) by mouth 2 (two) times daily. 08/15/22 08/15/23 Yes Johnson, Clanford L, MD  pregabalin (LYRICA) 75 MG capsule Take 1  capsule (75 mg total) by mouth 2 (two) times daily. 07/09/22  Yes Gerlene Fee, NP  topiramate (TOPAMAX) 50 MG tablet Take 0.5 tablets (25 mg total) by mouth 2 (two) times daily. Patient taking differently: Take 50 mg by mouth 2 (two) times daily. 07/09/22  Yes Gerlene Fee, NP  vitamin B-12 (CYANOCOBALAMIN) 500 MCG tablet Take 1,000 mcg by mouth daily.   Yes [provider]  zolpidem (AMBIEN) 5 MG tablet Take 5-10 mg by mouth  at bedtime as needed for sleep. 07/09/22  Yes [provider]  vancomycin (VANCOCIN) 125 MG capsule Take 1 capsule (125 mg total) by mouth 4 (four) times daily for 10 days. Patient not taking: Reported on 11/26/2022 11/26/22 12/06/22  Gabriel Rung, NP    Current Facility-Administered Medications  Medication Dose Route Frequency Provider Last Rate Last Admin   acetaminophen (TYLENOL) tablet 650 mg  650 mg Oral Q6H PRN Kristopher Oppenheim, DO       Or   acetaminophen (TYLENOL) suppository 650 mg  650 mg Rectal Q6H PRN Kristopher Oppenheim, DO       cephALEXin [facility]) capsule 500 mg  500 mg Oral Camelia Phenes Festus Aloe, MD   500 mg at 11/26/22 2139   finasteride (PROSCAR) tablet 5 mg  5 mg Oral Daily Kristopher Oppenheim, DO       ondansetron [facility]) tablet 4 mg  4 mg Oral Q6H PRN Kristopher Oppenheim, DO       Or   ondansetron [facility]) injection 4 mg  4 mg Intravenous Q6H PRN Kristopher Oppenheim, DO       pregabalin (LYRICA) capsule 75 mg  75 mg Oral BID Kristopher Oppenheim, DO   75 mg at 11/26/22 2305   topiramate (TOPAMAX) tablet 50 mg  50 mg Oral BID Kristopher Oppenheim, DO   50 mg at 11/26/22 2305   vancomycin (VANCOCIN) capsule 125 mg  125 mg Oral QID Kristopher Oppenheim, DO   125 mg at 11/26/22 2135   zolpidem (AMBIEN) tablet 5 mg  5 mg Oral QHS PRN Kristopher Oppenheim, DO       Current Outpatient Medications  Medication Sig Dispense Refill   Apremilast (OTEZLA) 30 MG TABS Take 30 mg by mouth 2 (two) times daily.     atorvastatin (LIPITOR) 40 MG tablet Take 1 tablet (40 mg total) by mouth daily. 30 tablet 0   Calcium 200 MG TABS Take 400 mg by mouth in the morning and at bedtime.     Cholecalciferol (VITAMIN D3) 250 MCG (10000 UT) TABS Take 1,000 Units by mouth daily.     enzalutamide (XTANDI) 80 MG tablet Take 80 mg by mouth daily.     ferrous sulfate 325 (65 FE) MG tablet Take 1 tablet (325 mg total) by mouth 2 (two) times daily with a meal. Mon, wed, fri 60 tablet 1   finasteride (PROSCAR) 5 MG tablet Take 1 tablet (5 mg total) by mouth daily. 30 tablet  0   furosemide (LASIX) 20 MG tablet Take 1 tablet (20 mg total) by mouth every other day. Tuesday, Thursday and Saturday 15 tablet 0   mirtazapine (REMERON) 15 MG tablet Take 1 tablet (15 mg total) by mouth at bedtime. 30 tablet 1   pantoprazole (PROTONIX) 40 MG tablet Take 1 tablet (40 mg total) by mouth 2 (two) times daily. 60 tablet 1   pregabalin (LYRICA) 75 MG capsule Take 1 capsule (75 mg total) by mouth 2 (two) times daily. 60 capsule 0  topiramate (TOPAMAX) 50 MG tablet Take 0.5 tablets (25 mg total) by mouth 2 (two) times daily. (Patient taking differently: Take 50 mg by mouth 2 (two) times daily.) 30 tablet 0   vitamin B-12 (CYANOCOBALAMIN) 500 MCG tablet Take 1,000 mcg by mouth daily.     zolpidem (AMBIEN) 5 MG tablet Take 5-10 mg by mouth at bedtime as needed for sleep.     vancomycin (VANCOCIN) 125 MG capsule Take 1 capsule (125 mg total) by mouth 4 (four) times daily for 10 days. (Patient not taking: Reported on 11/26/2022) 40 capsule 0    Allergies as of 11/26/2022 - Review Complete 11/26/2022  Allergen Reaction Noted   Iodine Hives, Itching, and Other (See Comments) 08/16/2014   Calan [verapamil] Other (See Comments) 02/25/2016   Cymbalta [duloxetine hcl] Other (See Comments) 08/16/2014   Iodinated contrast media Hives, Itching, and Other (See Comments) 05/17/2015   Latex Other (See Comments) 07/09/2015   Lisinopril Other (See Comments) 08/16/2014   Neurontin [gabapentin] Other (See Comments) 08/16/2014   Zoloft [sertraline hcl] Diarrhea and Other (See Comments) 08/16/2014   Valium [diazepam] Other (See Comments) 02/25/2016   Flomax [tamsulosin hcl] Other (See Comments) 02/25/2016   Glimepiride Other (See Comments) 02/25/2016   Melatonin Nausea Only 02/25/2016   Tape Other (See Comments) 07/09/2015    Past Medical History:  Diagnosis Date   Acute cholecystitis 04/05/2016   Anemia, iron deficiency    Chronic headaches    Migraines   Chronic kidney disease     Resolved   Depression    Diverticulitis    Essential hypertension    GI bleed    Dr. Laural Golden - 1998   Hypercholesteremia    Orthostatic hypotension    Peptic ulcer disease    Peripheral neuropathy    Polymyalgia rheumatica ([name]    Rectal bleeding 11/26/2017   Added automatically from request for surgery [id number]   Stroke [facility])    Residual trouble reading and writing   Syncope    Thyroid nodule    TIA (transient ischemic attack) 12/2017   Type 2 diabetes mellitus ([place])    Upper GI bleed 06/18/2012    Past Surgical History:  Procedure Laterality Date   APPENDECTOMY     as child   [place] IDET SPINAL [place] N/A 04/07/2016   Procedure: CHOLECYSTECTOMY;  Surgeon: Aviva Signs, MD;  Location: [facility];  Service: General;  Laterality: N/A;   [place]   COLONOSCOPY N/A 01/07/2018   Procedure: COLONOSCOPY;  Surgeon: Rogene Houston, MD;  Location: [facility];  Service: Endoscopy;  Laterality: N/A;  1:00   COLONOSCOPY WITH PROPOFOL N/A 08/12/2022   Procedure: COLONOSCOPY WITH PROPOFOL;  Surgeon: Harvel Quale, MD;  Location: [facility];  Service: Gastroenterology;  Laterality: N/A;  ASA 3   CRYOTHERAPY     ESOPHAGOGASTRODUODENOSCOPY (EGD) WITH PROPOFOL N/A 08/09/2022   Procedure: ESOPHAGOGASTRODUODENOSCOPY (EGD) WITH PROPOFOL;  Surgeon: Harvel Quale, MD;  Location: [facility];  Service: Gastroenterology;  Laterality: N/A;   EYE SURGERY     right cataract with lens implant   FEMUR SURGERY     RT [place] STUDY N/A 08/13/2022   Procedure: GIVENS CAPSULE STUDY;  Surgeon: Harvel Quale, MD;  Location: [facility];  Service: Gastroenterology;  Laterality: N/A;   HAND SURGERY  RIGHT   HERNIA REPAIR     1-right inguinal, 3- left inguinal   IRRIGATION AND DEBRIDEMENT KNEE Left 07/09/2015   Procedure: LEFT KNEE IRRIGATION AND DEBRIDEMENT WOUND CLOSURE;   Surgeon: Gaynelle Arabian, MD;  Location: [facility];  Service: Orthopedics;  Laterality: Left;   JOINT REPLACEMENT  02/28/2016   lt knee revision   KNEE DEBRIDEMENT     2016   LEFT   MENISCUS REPAIR     LEFT   POLYPECTOMY  01/07/2018   Procedure: POLYPECTOMY;  Surgeon: Rogene Houston, MD;  [facility];  Service: Endoscopy;;  colon    POLYPECTOMY  08/12/2022   Procedure: POLYPECTOMY;  Surgeon: Harvel Quale, MD;  [facility];  Service: Gastroenterology;;   SHOULDER OPEN ROTATOR CUFF REPAIR     RT SHOULDER   SPINAL FUSION     SPINAL FUSION     TONSILLECTOMY     TOTAL KNEE ARTHROPLASTY Left 05/28/2015   Procedure: LEFT TOTAL KNEE ARTHROPLASTY;  Surgeon: Gaynelle Arabian, MD;  Location: [facility];  Service: Orthopedics;  Laterality: Left;   TOTAL KNEE REVISION Left 02/28/2016   Procedure: LEFT TOTAL KNEE REVISION;  Surgeon: Leandrew Koyanagi, MD;  Location: [place];  Service: Orthopedics;  Laterality: Left;   TRIGGER FINGER RELEASE     RT HAND   1990S   UVULECTOMY  2004   TURBINATE,TONSIL,ADENOID    Family History  Problem Relation Age of Onset   Colon cancer Sister    Hypertension Other    Depression Brother    Depression Daughter     Social History   Socioeconomic History   Marital status: Married    Spouse name: Not on file   Number of children: Not on file   Years of education: Not on file   Highest education level: Not on file  Occupational History   Not on file  Tobacco Use   Smoking status: Former    Packs/day: 0.10    Years: 18.00    Total pack years: 1.80    Types: Cigars, Cigarettes    Start date: 12/18/1958    Quit date: 12/01/1986    Years since quitting: 36.0   Smokeless tobacco: Never   Tobacco comments:    Smoke cigars daily per daughter.  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   Social Determinants of Health   Financial Resource Strain: Not on file  Food Insecurity: No Food Insecurity (08/08/2022)   Hunger Vital Sign    Worried About Running Out of Food in the Last Year: Never true    Ran Out of Food in the Last Year: Never true  Transportation Needs: No Transportation Needs (08/08/2022)   PRAPARE - Hydrologist (Medical): No    Lack of Transportation (Non-Medical): No  Physical Activity: Not on file  Stress: Not on file  Social Connections: Not on file  Intimate Partner Violence: Not At Risk (08/08/2022)   Humiliation, Afraid, Rape, and Kick questionnaire    Fear of Current or Ex-Partner: No    Emotionally Abused: No    Physically Abused: No    Sexually Abused: No     Review  of System:  Unable to obtain      Physical Examination:   Vital signs in last 24 hours: Temp:  [97.7 F (36.5 C)-98.2 F (36.8 C)] 98.1 F (36.7 C) (12/28 0810) Pulse Rate:  [55-106] 70 (12/28 0800) Resp:  [13-28] 24 (12/28 0800) BP: (101-162)/(53-92) 130/86 (12/28 0800) SpO2:  [92 %-100 %] 92 % (12/28 0800) Weight:  [77.1 kg] 77.1 kg (12/27 1242) Last BM Date : 11/26/22  General:frail male in no acute distress. Oriented to person.   Head: Normocephalic, atraumatic.   Eyes: Conjunctiva pink, no icterus. Mouth: Oropharyngeal mucosa moist and pink , no lesions erythema or exudate. Neck: Supple without thyromegaly, masses, or lymphadenopathy.  Lungs: Clear to auscultation bilaterally.  Heart: Regular rate and rhythm, no murmurs rubs or gallops.  Abdomen: Bowel sounds are normal, nontender, nondistended, no hepatosplenomegaly or masses, no abdominal bruits or hernia , no rebound or guarding.   Rectal: not performed Extremities: No lower extremity edema, clubbing, deformity.  Neuro: Alert and oriented x 4 , grossly normal neurologically.  Skin: Warm and  dry, no rash or jaundice.   Psych: Alert and cooperative, normal mood and affect.        Intake/Output from previous day: 12/27 0701 - 12/28 0700 In: 500 [IV Piggyback:500] Out: -  Intake/Output this shift: No intake/output data recorded.  Lab Results:   CBC Recent Labs    11/26/22 1317 11/27/22 0500  WBC 6.9 12.6*  HGB 10.8* 10.9*  HCT 36.3* 36.2*  MCV 90.5 90.5  PLT 259 222   BMET Recent Labs    11/26/22 1317 11/27/22 0500  NA 138 141  K 4.0 4.7  CL 104 105  CO2 27 29  GLUCOSE 111* 113*  BUN 16 17  CREATININE 0.85 0.87  CALCIUM 9.4 9.5   LFT Recent Labs    11/26/22 1317 11/27/22 0500  BILITOT 0.7 0.7  ALKPHOS 61 53  AST 16 15  ALT 8 9  PROT 6.9 6.2*  ALBUMIN 2.9* 2.6*    Lipase Recent Labs    11/26/22 1317  LIPASE 23    PT/INR No results for input(s): "LABPROT", "INR" in the last 72 hours.   Hepatitis Panel No results for input(s): "HEPBSAG", "HCVAB", "HEPAIGM", "HEPBIGM" in the last 72 hours.   Imaging Studies:   CT Head Wo Contrast  Result Date: 11/26/2022 CLINICAL DATA:  Mental status change, unknown cause. EXAM: CT HEAD WITHOUT CONTRAST TECHNIQUE: Contiguous axial images were obtained from the base of the skull through the vertex without intravenous contrast. RADIATION DOSE REDUCTION: This exam was performed according to the departmental dose-optimization program which includes automated exposure control, adjustment of the mA and/or kV according to patient size and/or use of iterative reconstruction technique. COMPARISON:  Head CT 06/16/2022 and MRI 07/01/2021 FINDINGS: Brain: There is no evidence of an acute infarct, intracranial hemorrhage, mass, midline shift, or extra-axial fluid collection. Hypodensities in the cerebral white matter bilaterally are unchanged and nonspecific but compatible with mild chronic small vessel ischemic disease. There is mild cerebral atrophy. Vascular: Calcified atherosclerosis at the skull base. No hyperdense  vessel. Skull: No acute fracture or suspicious osseous lesion. Sinuses/Orbits: Chronic medial right orbital fracture. Visualized paranasal sinuses and mastoid air cells are clear. Bilateral cataract extraction. Other: None. IMPRESSION: 1. No evidence of acute intracranial abnormality. 2. Mild chronic small vessel ischemic disease. Electronically Signed   By: Logan Bores M.D.   On: 11/26/2022 15:35  [4 week]  Assessment:   88 y/o male with  history of TIA, DM, CVA, HTN, anemia, metastatic prostrate cancer presented to ED for weakness, change in mental status, C diff diarrhea.  C.diff: diagnosed 11/25/22. Patient's daughter notified yesterday, vancomycin sent to pharmacy. Due to weakness, decline in mental status, he was brought to the ED. Last antibiotics in 07/2022. Has had ongoing diarrhea for which daughter giving him imodium.    Plan:   Supportive care.  Vancomycin '125mg'$  every 6 hours for 10 days.  Soft/low fiber diet.  No need to repeat Cdiff testing as it was positive as outpatient.    LOS: 0 days   We would like to thank you for the opportunity to participate in the care of Jared Bridges.  Laureen Ochs. Bernarda Caffey [facility] [phone] 12/28/20238:22 AM

## 2022-11-27 NOTE — ED Notes (Signed)
PT at [facility].

## 2022-11-27 NOTE — Evaluation (Signed)
Physical Therapy Evaluation Patient Details Name: Jared Bridges MRN: [id number] DOB: 03/10/1930 Today's Date: 11/27/2022  History of Present Illness  88 year old white male history of hypertension, orthostatic hypotension, polymyalgia rheumatica, prior history of stroke, history of type 2 diabetes, presents to the ER today with reported diarrhea.  Patient had a video visit with GI on 11/20/2022.  Reportedly patient having diarrhea daily.  Patient is unable to give any history review of systems due to confusion/memory loss.  Per the video visit from 1221, patient's last antibiotic dose was in August 2023.  Appetite has been poor.  Orders were placed for C. difficile and GI pathogen.  Sample was collected on 11/21/2002.  Due to the Christmas holiday, it appears that the patient's daughter was not notified of the patient's positive C. difficile test until today.  Patient had a positive PCR for C. difficile.  Does not appear that toxin was tested for.   Clinical Impression  Patient demonstrates slow labored movement for sitting up at bedside requiring verbal cueing for proper hand placement to support self with fair carryover, limited standing at bedside due to BLE weakness with buckling of knees and unable to take steps.  Patient put back to bed with Mod/max assist to reposition, fair/good return for scooting self up in bed using BLE.  Patient will benefit from continued skilled physical therapy in hospital and recommended venue below to increase strength, balance, endurance for safe ADLs and gait.         Recommendations for follow up therapy are one component of a multi-disciplinary discharge planning process, led by the attending physician.  Recommendations may be updated based on patient status, additional functional criteria and insurance authorization.  Follow Up Recommendations Skilled nursing-short term rehab (<3 hours/day) Can patient physically be transported by private vehicle: No     Assistance Recommended at Discharge Intermittent Supervision/Assistance  Patient can return home with the following  A lot of help with walking and/or transfers;A lot of help with bathing/dressing/bathroom;Assistance with cooking/housework;Help with stairs or ramp for entrance    Equipment Recommendations None recommended by PT  Recommendations for Other Services       Functional Status Assessment Patient has had a recent decline in their functional status and demonstrates the ability to make significant improvements in function in a reasonable and predictable amount of time.     Precautions / Restrictions Precautions Precautions: Fall Restrictions Weight Bearing Restrictions: No      Mobility  Bed Mobility Overal bed mobility: Needs Assistance Bed Mobility: Supine to Sit, Sit to Supine     Supine to sit: Max assist Sit to supine: Max assist   General bed mobility comments: slow  labored movement    Transfers Overall transfer level: Needs assistance Equipment used: Rolling walker (2 wheels) Transfers: Sit to/from Stand Sit to Stand: Max assist           General transfer comment: poor standing balance using RW due to BLE weakness with buckling of knees    Ambulation/Gait                  Stairs            Wheelchair Mobility    Modified Rankin (Stroke Patients Only)       Balance Overall balance assessment: Needs assistance Sitting-balance support: Feet supported, No upper extremity supported Sitting balance-Leahy Scale: Poor Sitting balance - Comments: fair/poor seated at EOB   Standing balance support: During functional activity, Bilateral upper extremity supported Standing  balance-Leahy Scale: Poor Standing balance comment: using RW                             Pertinent Vitals/Pain Pain Assessment Pain Assessment: No/denies pain    Home Living Family/patient expects to be discharged to:: Private residence Living  Arrangements: Children;Spouse/significant other Available Help at Discharge: Family;Available 24 hours/day Type of Home: House Home Access: Stairs to enter;Ramped entrance Entrance Stairs-Rails: Can reach both Entrance Stairs-Number of Steps: 5 Alternate Level Stairs-Number of Steps: 17 Home Layout: Two level;Bed/bath upstairs;Able to live on main level with bedroom/bathroom;Full bath on main level Home Equipment: Rollator (4 wheels);Rolling Walker (2 wheels);Shower seat Additional Comments: Patient is poor historian, info taken from previous admission    Prior Function Prior Level of Function : Needs assist       Physical Assist : Mobility (physical);ADLs (physical) Mobility (physical): Bed mobility;Transfers;Gait;Stairs   Mobility Comments: Patient non-ambulatory, assisted for transfers to his wheelchair ADLs Comments: assisted by family     Hand Dominance   Dominant Hand: Right    Extremity/Trunk Assessment   Upper Extremity Assessment Upper Extremity Assessment: Generalized weakness    Lower Extremity Assessment Lower Extremity Assessment: Generalized weakness    Cervical / Trunk Assessment Cervical / Trunk Assessment: Kyphotic  Communication   Communication: HOH;Other (comment) (poor carryover for answering questions)  Cognition Arousal/Alertness: Awake/alert Behavior During Therapy: WFL for tasks assessed/performed Overall Cognitive Status: History of cognitive impairments - at baseline                                          General Comments      Exercises     Assessment/Plan    PT Assessment Patient needs continued PT services  PT Problem List Decreased strength;Decreased activity tolerance;Decreased balance;Decreased mobility       PT Treatment Interventions DME instruction;Gait training;Stair training;Functional mobility training;Therapeutic exercise;Therapeutic activities;Balance training;Patient/family education    PT Goals  (Current goals can be found in the Care Plan section)  Acute Rehab PT Goals Patient Stated Goal: return home with family to assist PT Goal Formulation: With patient Time For Goal Achievement: 12/11/22 Potential to Achieve Goals: Fair    Frequency Min 2X/week     Co-evaluation               AM-PAC PT "6 Clicks" Mobility  Outcome Measure Help needed turning from your back to your side while in a flat bed without using bedrails?: A Lot Help needed moving from lying on your back to sitting on the side of a flat bed without using bedrails?: A Lot Help needed moving to and from a bed to a chair (including a wheelchair)?: A Lot Help needed standing up from a chair using your arms (e.g., wheelchair or bedside chair)?: A Lot Help needed to walk in hospital room?: Total Help needed climbing 3-5 steps with a railing? : Total 6 Click Score: 10    End of Session   Activity Tolerance: Patient tolerated treatment well;Patient limited by fatigue Patient left: in bed;with call bell/phone within reach Nurse Communication: Mobility status PT Visit Diagnosis: Unsteadiness on feet (R26.81);Other abnormalities of gait and mobility (R26.89);Muscle weakness (generalized) (M62.81)    Time: [id number] PT Time Calculation (min) (ACUTE ONLY): 21 min   Charges:   PT Evaluation $PT Eval Moderate Complexity: 1 Mod PT Treatments $Therapeutic Activity:  8-22 mins        3:22 PM, 11/27/22 Lonell Grandchild, MPT Physical Therapist with [facility] 336 [phone] office [phone] mobile phone

## 2022-11-28 ENCOUNTER — Other Ambulatory Visit (HOSPITAL_COMMUNITY): Payer: Self-pay

## 2022-11-28 ENCOUNTER — Telehealth: Payer: Self-pay | Admitting: Gastroenterology

## 2022-11-28 DIAGNOSIS — R531 Weakness: Secondary | ICD-10-CM | POA: Diagnosis not present

## 2022-11-28 DIAGNOSIS — R338 Other retention of urine: Secondary | ICD-10-CM | POA: Diagnosis not present

## 2022-11-28 DIAGNOSIS — E860 Dehydration: Secondary | ICD-10-CM | POA: Diagnosis not present

## 2022-11-28 DIAGNOSIS — A0472 Enterocolitis due to Clostridium difficile, not specified as recurrent: Secondary | ICD-10-CM | POA: Diagnosis not present

## 2022-11-28 LAB — BASIC METABOLIC PANEL
Anion gap: 10 (ref 5–15)
BUN: 18 mg/dL (ref 8–23)
CO2: 19 mmol/L — ABNORMAL LOW (ref 22–32)
Calcium: 8.8 mg/dL — ABNORMAL LOW (ref 8.9–10.3)
Chloride: 109 mmol/L (ref 98–111)
Creatinine, Ser: 0.94 mg/dL (ref 0.61–1.24)
GFR, Estimated: >60 mL/min (ref >60)
Glucose, Bld: 128 mg/dL — ABNORMAL HIGH (ref 70–99)
Potassium: 4.2 mmol/L (ref 3.5–5.1)
Sodium: 138 mmol/L (ref 135–145)

## 2022-11-28 LAB — MAGNESIUM: Magnesium: 2.3 mg/dL (ref 1.7–2.4)

## 2022-11-28 MED ORDER — VANCOMYCIN HCL 125 MG PO CAPS: 125.0000 mg | ORAL_CAPSULE | Freq: Four times a day (QID) | ORAL | 0 refills | Status: AC

## 2022-11-28 MED ORDER — CHLORHEXIDINE GLUCONATE CLOTH 2 % EX PADS
6.0000 | MEDICATED_PAD | Freq: Every day | CUTANEOUS | Status: DC
  Administered 2022-11-28: 6 via TOPICAL

## 2022-11-28 MED ORDER — NYSTATIN 100000 UNIT/ML MT SUSP: 5.0000 mL | Freq: Four times a day (QID) | OROMUCOSAL | 0 refills | Status: DC | PRN

## 2022-11-28 NOTE — Progress Notes (Signed)
Patient discharge instructions discussed with his daughter and wife, including foley care. Patient wounds discussed and extra dressing pads sent with her. EMS transport being arranged by Network engineer.

## 2022-11-28 NOTE — Progress Notes (Signed)
Terra,LPN went in to room to remove IV for patient transport, small skin tear to that area. Applied oil emersion gauze and another layer of gauze, wrapping with gauze wrap and tape.  Patient tolerated.

## 2022-11-28 NOTE — Progress Notes (Signed)
Patient took medications in apple sauce without difficulty. No complaints of pain. Slept on and off this shift. Continued to monitor patient.

## 2022-11-28 NOTE — Discharge Summary (Addendum)
Physician Discharge Summary  Jared Bridges LXB:[id number] DOB: Mar 28, 1930 DOA: 11/26/2022  PCP: [name], MD  Admit date: 11/26/2022 Discharge date: 11/28/2022  Admitted From: Home  Discharge disposition: Home with home health   Recommendations for Outpatient Follow-Up:   Follow up with your primary care provider in one week.  Check CBC, BMP, magnesium in the next visit Follow-up with urology Dr. Junious Silk in 1 week to address Foley catheter.  Plan is to continue Foley catheter for at least 1 week. GI to arrange for follow-up as outpatient in 3 weeks.  Discharge Diagnosis:   Principal Problem:   C. difficile diarrhea Active Problems:   Dehydration   Essential hypertension   Acute urinary retention   Memory loss   Generalized weakness  Discharge Condition: Improved.  Diet recommendation: Low sodium, heart healthy.    Wound care: None.  Code status: Full.   History of Present Illness:   88 year old white male history of hypertension, orthostatic hypotension, polymyalgia rheumatica, prior history of stroke, history of type 2 diabetes presented to hospital with diarrhea.  Patient did have a video visit with GI on 11/20/2022 but had been having diarrhea, last antibiotic dose was in August 2023.  She also had some confusion and poor oral intake.  After the video visit orders were placed for [name] and GI pathogen panel and was notified that she was positive for C. difficile. Oral vancomycin was called into the pharmacy but unknown if she actually started show the patient presented to the ED.  In the ED, patient had stable vitals.  WBC was 6.9.  Lab otherwise within normal range.  CT head showed a small vessel ischemic changes. Patient was then admitted hospital for further evaluation and treatment.    Hospital Course:   Following conditions were addressed during hospitalization as listed below,  C. difficile diarrhea Patient improved symptomatically with IV  fluids and oral vancomycin.  GI also saw the patient during hospitalization.  Recommendation is oral vancomycin for 10 days..  Has clinically improved.  Follow-up with GI as outpatient in 3 weeks.  Acute urinary retention, history of metastatic prostate cancer Unable to put Foley as per ED provider.  Urology was consulted and patient underwent Foley catheter placement for meatal and urethral stricture.  Urology Dr. Junious Silk recommends continuation of Foley catheter for 7 days.  Continue finasteride.  Patient follows up with [facility]rology at [facility].  Have spoken with the patient's daughter regarding follow-up for Foley catheter after discharge.  Information regarding Dr. Junious Silk office has been given.  Essential hypertension Resume home medications on discharge.   Dehydration Improved after IV hydration.  Has been advanced on oral diet.   Memory loss Unclear about dementia.  Continue supportive care.    Debility, deconditioning.  Patient does need a lot of care at home including gadgets for ambulation and is mostly bedbound.  Will arrange for home health PT and RN on discharge.  Disposition.  At this time, patient is stable for disposition home with outpatient PCP and urology follow-up.  Medical Consultants:   Urology GI.  Procedures:    Foley catheter placement Subjective:   Today, patient was seen and examined at bedside.  No mention of further bowel movements.  Has been eating better.  Patient appears to be at his baseline.  Discharge Exam:   Vitals:   11/28/22 0002 11/28/22 0352  BP: (!) 90/53 (!) 107/55  Pulse: 70 60  Resp: 18 17  Temp: 98.5 F (36.9 C)  98.6 F (37 C)  SpO2: 95% 98%   Vitals:   11/27/22 1556 11/27/22 2149 11/28/22 0002 11/28/22 0352  BP: (!) 119/47 (!) 115/51 (!) 90/53 (!) 107/55  Pulse: 81 81 70 60  Resp: '16 17 18 17  '$ Temp: 97.6 F (36.4 C) 99.7 F (37.6 C) 98.5 F (36.9 C) 98.6 F (37 C)  TempSrc: Oral Oral  Oral  SpO2: 95% 100% 95% 98%  Weight:     74.5 kg  Height:       Body mass index is 21.09 kg/m.  General: Alert awake, not in obvious distress Communicative, eating at the time of my exam.  Elderly male HENT: pupils equally reacting to light,  No scleral pallor or icterus noted. Oral mucosa is moist.  Chest:  Clear breath sounds.  Diminished breath sounds bilaterally. No crackles or wheezes.  CVS: S1 &S2 heard. No murmur.  Regular rate and rhythm. Abdomen: Soft, nontender, nondistended.  Bowel sounds are heard.  Foley catheter in place. Extremities: No cyanosis, clubbing or edema.  Peripheral pulses are palpable. Psych: Alert, awake and Communicative, CNS:  No cranial nerve deficits.  Power equal in all extremities.   Skin: Warm and dry.  No rashes noted.  The results of significant diagnostics from this hospitalization (including imaging, microbiology, ancillary and laboratory) are listed below for reference.     Diagnostic Studies:   CT Head Wo Contrast  Result Date: 11/26/2022 CLINICAL DATA:  Mental status change, unknown cause. EXAM: CT HEAD WITHOUT CONTRAST TECHNIQUE: Contiguous axial images were obtained from the base of the skull through the vertex without intravenous contrast. RADIATION DOSE REDUCTION: This exam was performed according to the departmental dose-optimization program which includes automated exposure control, adjustment of the mA and/or kV according to patient size and/or use of iterative reconstruction technique. COMPARISON:  Head CT 06/16/2022 and MRI 07/01/2021 FINDINGS: Brain: There is no evidence of an acute infarct, intracranial hemorrhage, mass, midline shift, or extra-axial fluid collection. Hypodensities in the cerebral white matter bilaterally are unchanged and nonspecific but compatible with mild chronic small vessel ischemic disease. There is mild cerebral atrophy. Vascular: Calcified atherosclerosis at the skull base. No hyperdense vessel. Skull: No acute fracture or suspicious osseous lesion.  Sinuses/Orbits: Chronic medial right orbital fracture. Visualized paranasal sinuses and mastoid air cells are clear. Bilateral cataract extraction. Other: None. IMPRESSION: 1. No evidence of acute intracranial abnormality. 2. Mild chronic small vessel ischemic disease. Electronically Signed   By: Logan Bores M.D.   On: 11/26/2022 15:35     Labs:   Basic Metabolic Panel: Recent Labs  Lab 11/26/22 1317 11/27/22 0500 11/28/22 0531  NA 138 141 138  K 4.0 4.7 4.2  CL 104 105 109  CO2 27 29 19*  GLUCOSE 111* 113* 128*  BUN '16 17 18  '$ CREATININE 0.85 0.87 0.94  CALCIUM 9.4 9.5 8.8*  MG  --   --  2.3   GFR Estimated Creatinine Clearance: 55 mL/min (by C-G formula based on SCr of 0.94 mg/dL). Liver Function Tests: Recent Labs  Lab 11/26/22 1317 11/27/22 0500  AST 16 15  ALT 8 9  ALKPHOS 61 53  BILITOT 0.7 0.7  PROT 6.9 6.2*  ALBUMIN 2.9* 2.6*   Recent Labs  Lab 11/26/22 1317  LIPASE 23   No results for input(s): "AMMONIA" in the last 168 hours. Coagulation profile No results for input(s): "INR", "PROTIME" in the last 168 hours.  CBC: Recent Labs  Lab 11/26/22 1317 11/27/22 0500 11/27/22 1434  WBC 6.9 12.6* 13.4*  NEUTROABS 5.6 11.0*  --   HGB 10.8* 10.9* 11.3*  HCT 36.3* 36.2* 37.1*  MCV 90.5 90.5 90.3  PLT 259 222 256   Cardiac Enzymes: No results for input(s): "CKTOTAL", "CKMB", "CKMBINDEX", "TROPONINI" in the last 168 hours. BNP: Invalid input(s): "POCBNP" CBG: Recent Labs  Lab 11/26/22 1324  GLUCAP 118*   D-Dimer No results for input(s): "DDIMER" in the last 72 hours. Hgb A1c No results for input(s): "HGBA1C" in the last 72 hours. Lipid Profile No results for input(s): "CHOL", "HDL", "LDLCALC", "TRIG", "CHOLHDL", "LDLDIRECT" in the last 72 hours. Thyroid function studies No results for input(s): "TSH", "T4TOTAL", "T3FREE", "THYROIDAB" in the last 72 hours.  Invalid input(s): "FREET3" Anemia work up No results for input(s): "VITAMINB12",  "FOLATE", "FERRITIN", "TIBC", "IRON", "RETICCTPCT" in the last 72 hours. Microbiology Recent Results (from the past 240 hour(s))  Clostridium difficile Toxin B, Qualitative, Real-Time PCR     Status: Abnormal   Collection Time: 11/21/22 11:22 AM  Result Value Ref Range Status   Toxigenic C. Difficile by PCR DETECTED (A) NOT DETECTED Final    Comment: . The stool sample is POSITIVE for toxigenic C. difficile.  This result is suggestive of C. difficile infection  (CDI) if accompanied by appropriate clinical symptoms. . Simultaneous testing does not identify a genetic marker of the hypervirulent 027/NAP1/BI strain of toxigenic  C. difficile. . This test is for use only with liquid or soft stools;  performance characteristics of other clinical specimen  types have not been established. . This assay was performed by Cepheid GeneXpert(R) PCR. The performance characteristics of this assay have been determined by [employer]. Performance characteristics refer to the analytical performance of the test. . For additional information, please refer to http://education.[employer].com/faq/FAQ136 (This link is being provided for informational/educational purposes only.) .      Discharge Instructions:   Discharge Instructions     Diet general   Complete by: As directed    Discharge instructions   Complete by: As directed    Follow-up with your primary care provider in 1 week.  Check blood work at that time.  Complete course of vancomycin.  Seek medical attention for worsening symptoms.  Continue Foley catheter.  Follow-up with urology Dr Junious Silk in 1 week to address Foley catheter.   Discharge wound care:   Complete by: As directed    Pressure injury care.   Increase activity slowly   Complete by: As directed       Allergies as of 11/28/2022       Reactions   Iodine Hives, Itching, Other (See Comments)   very allergic(per daughter), can pre-med with benadryl    Calan [verapamil] Other (See Comments)   Weakness   Cymbalta [duloxetine Hcl] Other (See Comments)   Makes patient have jerking motions.   Iodinated Contrast Media Hives, Itching, Other (See Comments)   Can pre-med with benadryl   Latex Other (See Comments)   Redness iritation   Lisinopril Other (See Comments)   Makes patient have jerking motions.   Neurontin [gabapentin] Other (See Comments)   Makes patient have jerking motions.   Zoloft [sertraline Hcl] Diarrhea, Other (See Comments)   Makes patient have jerking motions.   Valium [diazepam] Other (See Comments)   "Did not work"   Flomax [tamsulosin Hcl] Other (See Comments)   Dizziness   Glimepiride Other (See Comments)   "Did not work"   Melatonin Nausea Only   Tape Other (See Comments)  Skin irritation        Medication List     TAKE these medications    atorvastatin 40 MG tablet Commonly known as: LIPITOR Take 1 tablet (40 mg total) by mouth daily.   Calcium 200 MG Tabs Take 400 mg by mouth in the morning and at bedtime.   cyanocobalamin 500 MCG tablet Commonly known as: VITAMIN B12 Take 1,000 mcg by mouth daily.   ferrous sulfate 325 (65 FE) MG tablet Take 1 tablet (325 mg total) by mouth 2 (two) times daily with a meal. Mon, wed, fri   finasteride 5 MG tablet Commonly known as: PROSCAR Take 1 tablet (5 mg total) by mouth daily.   furosemide 20 MG tablet Commonly known as: Lasix Take 1 tablet (20 mg total) by mouth every other day. Tuesday, Thursday and Saturday   magic mouthwash (nystatin, lidocaine, diphenhydrAMINE, alum & mag hydroxide) suspension Swish and spit 5 mLs 4 (four) times daily as needed for mouth pain.   mirtazapine 15 MG tablet Commonly known as: Remeron Take 1 tablet (15 mg total) by mouth at bedtime.   Otezla 30 MG Tabs Generic drug: Apremilast Take 30 mg by mouth 2 (two) times daily.   pantoprazole 40 MG tablet Commonly known as: Protonix Take 1 tablet (40 mg total) by mouth  2 (two) times daily.   pregabalin 75 MG capsule Commonly known as: LYRICA Take 1 capsule (75 mg total) by mouth 2 (two) times daily.   topiramate 50 MG tablet Commonly known as: TOPAMAX Take 0.5 tablets (25 mg total) by mouth 2 (two) times daily. What changed: how much to take   vancomycin 125 MG capsule Commonly known as: VANCOCIN Take 1 capsule (125 mg total) by mouth 4 (four) times daily for 8 days.   Vitamin D3 250 MCG (10000 UT) Tabs Take 1,000 Units by mouth daily.   Xtandi 80 MG tablet Generic drug: enzalutamide Take 80 mg by mouth daily.   zolpidem 5 MG tablet Commonly known as: AMBIEN Take 5-10 mg by mouth at bedtime as needed for sleep.               Discharge Care Instructions  (From admission, onward)           Start     Ordered   11/28/22 0000  Discharge wound care:       Comments: Pressure injury care.   11/28/22 1110            Follow-up Information     [name], MD Follow up in 1 week(s).   Specialty: Internal Medicine Contact information: [place] [address] [phone]         Festus Aloe, MD. Schedule an appointment as soon as possible for a visit in 1 week(s).   Specialty: Urology Why: for foley cath removal Contact information: [address]  [place] [address] [phone]                  Time coordinating discharge: 39 minutes  Signed:  Rydan Gulyas  [facility] 11/28/2022, 1:51 PM

## 2022-11-28 NOTE — Evaluation (Signed)
Clinical/Bedside Swallow Evaluation Patient Details  Name: Jared Bridges MRN: [id number] Date of Birth: Dec 23, 1929  Today's Date: 11/28/2022 Time: SLP Start Time (ACUTE ONLY): 0539 SLP Stop Time (ACUTE ONLY): 7673 SLP Time Calculation (min) (ACUTE ONLY): 65 min  Past Medical History:  Past Medical History:  Diagnosis Date   Acute cholecystitis 04/05/2016   Anemia, iron deficiency    Chronic headaches    Migraines   Chronic kidney disease    Resolved   Depression    Diverticulitis    Essential hypertension    GI bleed    Dr. Laural Golden - 1998   Hypercholesteremia    Orthostatic hypotension    Peptic ulcer disease    Peripheral neuropathy    Polymyalgia rheumatica ([place])    Prostate cancer ([place])    Rectal bleeding 11/26/2017   Added automatically from request for surgery [id number]   Stroke [facility])    Residual trouble reading and writing   Syncope    Thyroid nodule    TIA (transient ischemic attack) 12/2017   Type 2 diabetes mellitus ([place])    Upper GI bleed 06/18/2012   Past Surgical History:  Past Surgical History:  Procedure Laterality Date   APPENDECTOMY     as child   BACK SURGERY     2000 IDET SPINAL PROC   CHOLECYSTECTOMY N/A 04/07/2016   Procedure: CHOLECYSTECTOMY;  Surgeon: Aviva Signs, MD;  Location: [facility];  Service: General;  Laterality: N/A;   [name]   COLONOSCOPY N/A 01/07/2018   Procedure: COLONOSCOPY;  Surgeon: Rogene Houston, MD;  Location: [facility];  Service: Endoscopy;  Laterality: N/A;  1:00   COLONOSCOPY WITH PROPOFOL N/A 08/12/2022   Procedure: COLONOSCOPY WITH PROPOFOL;  Surgeon: Harvel Quale, MD;  Location: [facility];  Service: Gastroenterology;  Laterality: N/A;  ASA 3   CRYOTHERAPY     ESOPHAGOGASTRODUODENOSCOPY (EGD) WITH PROPOFOL N/A 08/09/2022   Procedure: ESOPHAGOGASTRODUODENOSCOPY (EGD) WITH PROPOFOL;  Surgeon: Harvel Quale, MD;  [facility];  Service:  Gastroenterology;  Laterality: N/A;   EYE SURGERY     right cataract with lens implant   FEMUR SURGERY     RT [place] STUDY N/A 08/13/2022   Procedure: GIVENS CAPSULE STUDY;  Surgeon: Harvel Quale, MD;  [facility]: [facility];  Service: Gastroenterology;  Laterality: N/A;   HAND SURGERY     RIGHT   HERNIA REPAIR     1-right inguinal, 3- left inguinal   IRRIGATION AND DEBRIDEMENT KNEE Left 07/09/2015   Procedure: LEFT KNEE IRRIGATION AND DEBRIDEMENT WOUND CLOSURE;  Surgeon: Gaynelle Arabian, MD;  Location: [facility];  Service: Orthopedics;  Laterality: Left;   JOINT REPLACEMENT  02/28/2016   lt knee revision   KNEE DEBRIDEMENT     2016   LEFT   MENISCUS REPAIR     LEFT   POLYPECTOMY  01/07/2018   Procedure: POLYPECTOMY;  Surgeon: Rogene Houston, MD;  [facility];  Service: Endoscopy;;  colon    POLYPECTOMY  08/12/2022   Procedure: POLYPECTOMY;  Surgeon: Harvel Quale, MD;  [facility];  Service: Gastroenterology;;   SHOULDER OPEN ROTATOR CUFF REPAIR     RT SHOULDER   SPINAL FUSION     SPINAL FUSION     TONSILLECTOMY     TOTAL KNEE ARTHROPLASTY Left 05/28/2015   Procedure: LEFT  TOTAL KNEE ARTHROPLASTY;  Surgeon: Gaynelle Arabian, MD;  Location: [facility];  Service: Orthopedics;  Laterality: Left;   TOTAL KNEE REVISION Left 02/28/2016   Procedure: LEFT TOTAL KNEE REVISION;  Surgeon: Leandrew Koyanagi, MD;  Location: [facility];  Service: Orthopedics;  Laterality: Left;   TRIGGER FINGER RELEASE     RT HAND   1990S   UVULECTOMY  2004   TURBINATE,TONSIL,ADENOID   HPI:  88 year old white male history of hypertension, orthostatic hypotension, polymyalgia rheumatica, prior history of stroke, history of type 2 diabetes, presents to the ER today with reported diarrhea.  Patient had a video visit with GI on 11/20/2022.  Reportedly patient having diarrhea daily.  Patient is unable to give any history review of systems due to confusion/memory  loss.  Per the video visit from 1221, patient's last antibiotic dose was in August 2023.  Appetite has been poor.  Orders were placed for C. difficile and GI pathogen.  Sample was collected on 11/21/1985.  Due to the Christmas holiday, it appears that the patient's daughter was not notified of the patient's positive C. difficile test until today.  Patient had a positive PCR for C. difficile. BSE requested   MBSS recently completed 06/20/2022 <<Pt presents with mild pharyngeal dysphagia characterized by penetration of thin liquids with a straw; one episode (with pill administration) penetrates fell slightly below the cords but with cued cough were expelled from the airway. Pt presents with diminished pharyngeal stripping wave and valleculae and pyriform residue across consistencies, however note increased residue with NTL. Pt with decreased laryngeal vestibule closure when drinking from a straw. Note baseline congested cough a couple of times throughout study, however, no penetrates/aspirates present at the time. Recommend upgrade Pt's diet to mechanical soft/D3 and thin liquids with strategies re: NO STRAWS, ensure Pt is sitting upright and alert for all PO, occasional throat clears/cough and repeat dry swallows throughout meal. Recommend crush meds in puree or administer whole in puree. ST will continue to follow acutely and will benefit from ST dysphagia therapy f/u at discharge location. >>   Assessment / Plan / Recommendation  Clinical Impression  Clinical swallowing evaluation completed while Pt was sitting upright in bed. Oral care complete and note posterior portion of lingual surface to be black; unable to remove with toothette/toothbrush. Pt's daughter, caregiver present at bedside for evaluation. Pt consumed thin liquids and regular textures without overt s/sx of aspiration. Note occasional congested sounding coughing that did not seem related to swallowing. Pt's daughter is intentional to follow  recommendations from previous MBSS at home. Recommend continue on D3/mech soft diet and thin liquids. Recommend meds whole with puree or liquids as able. There are no further ST needs noted at this time, ST will sign off. Thank you, SLP Visit Diagnosis: Dysphagia, unspecified (R13.10)    Aspiration Risk  Mild aspiration risk    Diet Recommendation Dysphagia 3 (Mech soft);Thin liquid   Liquid Administration via: Cup;Straw Medication Administration: Whole meds with puree Supervision: Patient able to self feed Compensations: Minimize environmental distractions;Slow rate;Small sips/bites Postural Changes: Seated upright at 90 degrees    Other  Recommendations Oral Care Recommendations: Oral care BID    Recommendations for follow up therapy are one component of a multi-disciplinary discharge planning process, led by the attending physician.  Recommendations may be updated based on patient status, additional functional criteria and insurance authorization.  Follow up Recommendations No SLP follow up        Swallow Study   General HPI:  88 year old white male history of hypertension, orthostatic hypotension, polymyalgia rheumatica, prior history of stroke, history of type 2 diabetes, presents to the ER today with reported diarrhea.  Patient had a video visit with GI on 11/20/2022.  Reportedly patient having diarrhea daily.  Patient is unable to give any history review of systems due to confusion/memory loss.  Per the video visit from 1221, patient's last antibiotic dose was in August 2023.  Appetite has been poor.  Orders were placed for C. difficile and GI pathogen.  Sample was collected on 11/21/1985.  Due to the Christmas holiday, it appears that the patient's daughter was not notified of the patient's positive C. difficile test until today.  Patient had a positive PCR for C. difficile. BSE requested Type of Study: Bedside Swallow Evaluation Previous Swallow Assessment: MBSS 06/20/22 Diet Prior  to this Study: Dysphagia 3 (soft);Thin liquids Temperature Spikes Noted: No Respiratory Status: Room air History of Recent Intubation: No Behavior/Cognition: Alert;Cooperative;Pleasant mood Oral Cavity Assessment: Within Functional Limits Oral Care Completed by SLP: Yes Oral Cavity - Dentition: Adequate natural dentition Vision: Functional for self-feeding Self-Feeding Abilities: Able to feed self;Needs assist;Needs set up Patient Positioning: Upright in bed Baseline Vocal Quality: Normal Volitional Cough: Congested Volitional Swallow: Able to elicit    Oral/Motor/Sensory Function Overall Oral Motor/Sensory Function: Within functional limits   Ice Chips Ice chips: Within functional limits   Thin Liquid Thin Liquid: Within functional limits    Nectar Thick Nectar Thick Liquid: Not tested   Honey Thick Honey Thick Liquid: Not tested   Puree Puree: Within functional limits   Solid     Solid: Within functional limits     Jared Bridges, CCC-SLP Speech Language Pathologist  Wende Bushy 11/28/2022,1:10 PM

## 2022-11-28 NOTE — Telephone Encounter (Signed)
Please schedule patient a follow up in about 3 weeks with [facility] or Dr. Loletha Grayer regarding Cdiff.   Venetia Night, MSN, APRN, FNP-BC, AGACNP-BC [facility] at [facility]

## 2022-12-08 ENCOUNTER — Ambulatory Visit: Payer: Medicare Other | Admitting: Urology

## 2022-12-08 ENCOUNTER — Encounter: Payer: Self-pay | Admitting: Urology

## 2022-12-08 VITALS — BP 132/78 | HR 75

## 2022-12-08 DIAGNOSIS — N138 Other obstructive and reflux uropathy: Secondary | ICD-10-CM

## 2022-12-08 DIAGNOSIS — N401 Enlarged prostate with lower urinary tract symptoms: Secondary | ICD-10-CM | POA: Diagnosis not present

## 2022-12-08 DIAGNOSIS — C61 Malignant neoplasm of prostate: Secondary | ICD-10-CM | POA: Diagnosis not present

## 2022-12-08 DIAGNOSIS — R338 Other retention of urine: Secondary | ICD-10-CM

## 2022-12-08 MED ORDER — ALFUZOSIN HCL ER 10 MG PO TB24: 10.0000 mg | ORAL_TABLET | Freq: Every day | ORAL | 3 refills | Status: DC

## 2022-12-08 MED ORDER — FINASTERIDE 5 MG PO TABS: 5.0000 mg | ORAL_TABLET | Freq: Every day | ORAL | 3 refills | Status: DC

## 2022-12-08 NOTE — Patient Instructions (Signed)
Acute Urinary Retention, Male  Acute urinary retention is a condition in which a person is unable to pass urine or can only pass a little urine. This condition can happen suddenly and last for a short time. If left untreated, it can become long-term (chronic) and result in kidney damage or other serious complications. What are the causes? This condition may be caused by: Obstruction or narrowing of the tube that drains the bladder (urethra). This may be caused by surgery, problems with nearby organs, or injury to the bladder or urethra. Problems with the nerves in the bladder. Tumors in the area of the pelvis, bladder, or urethra. Certain medicines. Bladder or urinary tract infection. Constipation. What increases the risk? This condition is more likely to develop in older men. As men age, their prostate may become larger and may start to press or squeeze on the bladder or the urethra. Other chronic health conditions can increase the risk of acute urinary retention. These include: Diseases such as multiple sclerosis. Spinal cord injuries. Diabetes. Degenerative cognitive conditions, such as delirium or dementia. Psychological conditions. A man may hold his urine due to trauma or because he does not want to use the bathroom. What are the signs or symptoms? Symptoms of this condition include: Trouble urinating. Pain in the lower abdomen. How is this diagnosed? This condition is diagnosed based on a physical exam and your medical history. You may also have other tests, including: An ultrasound of the bladder or kidneys or both. Blood tests. A urine analysis. Additional tests may be needed, such as a CT scan, MRI, and kidney or bladder function tests. How is this treated? Treatment for this condition may include: Medicines. Placing a thin, sterile tube (catheter) into the bladder to drain urine out of the body. This is called an indwelling urinary catheter. After it is inserted, the  catheter is held in place with a small balloon that is filled with sterile water. Urine drains from the catheter into a collection bag outside of the body. Behavioral therapy. Treatment for other conditions. If needed, you may be treated in the hospital for kidney function problems or to manage other complications. Follow these instructions at home: Medicines Take over-the-counter and prescription medicines only as told by your health care provider. Avoid certain medicines, such as decongestants, antihistamines, and some prescription medicines. Do not take any medicine unless your health care provider approves. If you were prescribed an antibiotic medicine, take it as told by your health care provider. Do not stop using the antibiotic even if you start to feel better. General instructions Do not use any products that contain nicotine or tobacco. These products include cigarettes, chewing tobacco, and vaping devices, such as e-cigarettes. If you need help quitting, ask your health care provider. Drink enough fluid to keep your urine pale yellow. If you have an indwelling urinary catheter, follow the instructions from your health care provider. Monitor any changes in your symptoms. Tell your health care provider about any changes. If instructed, monitor your blood pressure at home. Report changes as told by your health care provider. Keep all follow-up visits. This is important. Contact a health care provider if: You have uncomfortable bladder contractions that you cannot control (spasms). You leak urine with the spasms. Get help right away if: You have chills or a fever. You have blood in your urine. You have a catheter and the following happens: Your catheter stops draining urine. Your catheter falls out. Summary Acute urinary retention is a condition in  which a person is unable to pass urine or can only pass a little urine. If left untreated, this condition can result in kidney damage or  other serious complications. An enlarged prostate may cause this condition. As men age, their prostate gland may become larger and may press or squeeze on the bladder or the urethra. Treatment for this condition may include medicines and placement of an indwelling urinary catheter. Monitor any changes in your symptoms. Tell your health care provider about any changes. This information is not intended to replace advice given to you by your health care provider. Make sure you discuss any questions you have with your health care provider. Document Revised: 08/08/2020 Document Reviewed: 08/08/2020 Elsevier Patient Education  [place].

## 2022-12-08 NOTE — Progress Notes (Signed)
12/08/2022 10:25 AM   Jared Bridges 06/08/1930 [id number]  Referring provider: Francene Bridges, [place],  [place] [address]  Urinary retention   HPI: [name] is a 89yo here for evaluation of urinary retention. He was hospitalized over a week ago for sepsis and he had urinary retention at that time. He had a cystoscopy at [facility] prior to his septic episode. He currently on uroxatral '10mg'$  and finasteride '5mg'$  daily. He is currently confined to a stretcher after neck fracture August 2022.    PMH: Past Medical History:  Diagnosis Date   Acute cholecystitis 04/05/2016   Anemia, iron deficiency    Chronic headaches    Migraines   Chronic kidney disease    Resolved   Depression    Diverticulitis    Essential hypertension    GI bleed    Dr. Laural Bridges - 1998   Hypercholesteremia    Orthostatic hypotension    Peptic ulcer disease    Peripheral neuropathy    Polymyalgia rheumatica ([name]    Prostate cancer ([place])    Rectal bleeding 11/26/2017   Added automatically from request for surgery [id number]   Stroke [facility])    Residual trouble reading and writing   Syncope    Thyroid nodule    TIA (transient ischemic attack) 12/2017   Type 2 diabetes mellitus ([place])    Upper GI bleed 06/18/2012    Surgical History: Past Surgical History:  Procedure Laterality Date   APPENDECTOMY     as child   BACK SURGERY     2000 IDET SPINAL PROC   CHOLECYSTECTOMY N/A 04/07/2016   Procedure: CHOLECYSTECTOMY;  Surgeon: Jared Signs, MD;  Location: [facility];  Service: General;  Laterality: N/A;   [place]   COLONOSCOPY N/A 01/07/2018   Procedure: COLONOSCOPY;  Surgeon: Jared Houston, MD;  [facility]: [facility];  Service: Endoscopy;  Laterality: N/A;  1:00   COLONOSCOPY WITH PROPOFOL N/A 08/12/2022   Procedure: COLONOSCOPY WITH PROPOFOL;  Surgeon: Jared Quale, MD;  Location: [facility];  Service: Gastroenterology;  Laterality: N/A;  ASA 3    CRYOTHERAPY     ESOPHAGOGASTRODUODENOSCOPY (EGD) WITH PROPOFOL N/A 08/09/2022   Procedure: ESOPHAGOGASTRODUODENOSCOPY (EGD) WITH PROPOFOL;  Surgeon: Jared Quale, MD;  [facility]: [facility];  Service: Gastroenterology;  Laterality: N/A;   EYE SURGERY     right cataract with lens implant   FEMUR SURGERY     RT [place] STUDY N/A 08/13/2022   Procedure: GIVENS CAPSULE STUDY;  Surgeon: Jared Quale, MD;  Location: [facility];  Service: Gastroenterology;  Laterality: N/A;   HAND SURGERY     RIGHT   HERNIA REPAIR     1-right inguinal, 3- left inguinal   IRRIGATION AND DEBRIDEMENT KNEE Left 07/09/2015   Procedure: LEFT KNEE IRRIGATION AND DEBRIDEMENT WOUND CLOSURE;  Surgeon: Jared Arabian, MD;  Location: [facility];  Service: Orthopedics;  Laterality: Left;   JOINT REPLACEMENT  02/28/2016   lt knee revision   KNEE DEBRIDEMENT     2016   LEFT   MENISCUS REPAIR     LEFT   POLYPECTOMY  01/07/2018   Procedure: POLYPECTOMY;  Surgeon: Jared Houston, MD;  [facility];  Service: Endoscopy;;  colon    POLYPECTOMY  08/12/2022   Procedure: POLYPECTOMY;  Surgeon: Jared Quale, MD;  [facility]:  [facility];  Service: Gastroenterology;;   SHOULDER OPEN ROTATOR CUFF REPAIR     RT SHOULDER   SPINAL FUSION     SPINAL FUSION     TONSILLECTOMY     TOTAL KNEE ARTHROPLASTY Left 05/28/2015   Procedure: LEFT TOTAL KNEE ARTHROPLASTY;  Surgeon: Jared Arabian, MD;  Location: [facility];  Service: Orthopedics;  Laterality: Left;   TOTAL KNEE REVISION Left 02/28/2016   Procedure: LEFT TOTAL KNEE REVISION;  Surgeon: Jared Koyanagi, MD;  Location: [facility];  Service: Orthopedics;  Laterality: Left;   TRIGGER FINGER RELEASE     RT HAND   1990S   UVULECTOMY  2004   TURBINATE,TONSIL,ADENOID    Home Medications:  Allergies as of 12/08/2022       Reactions   Iodine Hives, Itching, Other (See Comments)   very allergic(per daughter), can pre-med with  benadryl   Calan [verapamil] Other (See Comments)   Weakness   Cymbalta [duloxetine Hcl] Other (See Comments)   Makes patient have jerking motions.   Iodinated Contrast Media Hives, Itching, Other (See Comments)   Can pre-med with benadryl   Latex Other (See Comments)   Redness iritation   Lisinopril Other (See Comments)   Makes patient have jerking motions.   Neurontin [gabapentin] Other (See Comments)   Makes patient have jerking motions.   Zoloft [sertraline Hcl] Diarrhea, Other (See Comments)   Makes patient have jerking motions.   Valium [diazepam] Other (See Comments)   "Did not work"   Flomax [tamsulosin Hcl] Other (See Comments)   Dizziness   Glimepiride Other (See Comments)   "Did not work"   Melatonin Nausea Only   Tape Other (See Comments)   Skin irritation        Medication List        Accurate as of December 08, 2022 10:25 AM. If you have any questions, ask your nurse or doctor.          atorvastatin 40 MG tablet Commonly known as: LIPITOR Take 1 tablet (40 mg total) by mouth daily.   Calcium 200 MG Tabs Take 400 mg by mouth in the morning and at bedtime.   cyanocobalamin 500 MCG tablet Commonly known as: VITAMIN B12 Take 1,000 mcg by mouth daily.   ferrous sulfate 325 (65 FE) MG tablet Take 1 tablet (325 mg total) by mouth 2 (two) times daily with a meal. Mon, wed, fri   finasteride 5 MG tablet Commonly known as: PROSCAR Take 1 tablet (5 mg total) by mouth daily.   furosemide 20 MG tablet Commonly known as: Lasix Take 1 tablet (20 mg total) by mouth every other day. Tuesday, Thursday and Saturday   magic mouthwash (nystatin, lidocaine, diphenhydrAMINE, alum & mag hydroxide) suspension Swish and spit 5 mLs 4 (four) times daily as needed for mouth pain.   mirtazapine 15 MG tablet Commonly known as: Remeron Take 1 tablet (15 mg total) by mouth at bedtime.   Otezla 30 MG Tabs Generic drug: Apremilast Take 30 mg by mouth 2 (two) times  daily.   pantoprazole 40 MG tablet Commonly known as: Protonix Take 1 tablet (40 mg total) by mouth 2 (two) times daily.   pregabalin 75 MG capsule Commonly known as: LYRICA Take 1 capsule (75 mg total) by mouth 2 (two) times daily.   topiramate 50 MG tablet Commonly known as: TOPAMAX Take 0.5 tablets (25 mg total) by mouth 2 (two) times daily. What changed: how much to take   Vitamin  D3 250 MCG (10000 UT) Tabs Take 1,000 Units by mouth daily.   Xtandi 80 MG tablet Generic drug: enzalutamide Take 80 mg by mouth daily.   zolpidem 5 MG tablet Commonly known as: AMBIEN Take 5-10 mg by mouth at bedtime as needed for sleep.        Allergies:  Allergies  Allergen Reactions   Iodine Hives, Itching and Other (See Comments)    very allergic(per daughter), can pre-med with benadryl   Calan [Verapamil] Other (See Comments)    Weakness   Cymbalta [Duloxetine Hcl] Other (See Comments)    Makes patient have jerking motions.   Iodinated Contrast Media Hives, Itching and Other (See Comments)    Can pre-med with benadryl   Latex Other (See Comments)    Redness iritation   Lisinopril Other (See Comments)    Makes patient have jerking motions.   Neurontin [Gabapentin] Other (See Comments)    Makes patient have jerking motions.   Zoloft [Sertraline Hcl] Diarrhea and Other (See Comments)    Makes patient have jerking motions.   Valium [Diazepam] Other (See Comments)    "Did not work"   Flomax [Tamsulosin Hcl] Other (See Comments)    Dizziness   Glimepiride Other (See Comments)    "Did not work"   Melatonin Nausea Only   Tape Other (See Comments)    Skin irritation    Family History: Family History  Problem Relation Age of Onset   Colon cancer Sister    Hypertension Other    Depression Brother    Depression Daughter     Social History:  reports that he quit smoking about 36 years ago. His smoking use included cigars and cigarettes. He started smoking about 64 years ago.  He has a 1.80 pack-year smoking history. He has never used smokeless tobacco. He reports that he does not drink alcohol and does not use drugs.  ROS: All other review of systems were reviewed and are negative except what is noted above in HPI  Physical Exam: BP 132/78   Pulse 75   Constitutional:  Alert and oriented, No acute distress. HEENT: [place] AT, moist mucus membranes.  Trachea midline, no masses. Cardiovascular: No clubbing, cyanosis, or edema. Respiratory: Normal respiratory effort, no increased work of breathing. GI: Abdomen is soft, nontender, nondistended, no abdominal masses GU: No CVA tenderness.  Lymph: No cervical or inguinal lymphadenopathy. Skin: No rashes, bruises or suspicious lesions. Neurologic: Grossly intact, no focal deficits, moving all 4 extremities. Psychiatric: Normal mood and affect.  Laboratory Data: Lab Results  Component Value Date   WBC 13.4 (H) 11/27/2022   HGB 11.3 (L) 11/27/2022   HCT 37.1 (L) 11/27/2022   MCV 90.3 11/27/2022   PLT 256 11/27/2022    Lab Results  Component Value Date   CREATININE 0.94 11/28/2022    Lab Results  Component Value Date   PSA 14.1 (H) 04/25/2020    No results found for: "TESTOSTERONE"  Lab Results  Component Value Date   HGBA1C 4.8 08/14/2022    Urinalysis    Component Value Date/Time   COLORURINE YELLOW 11/26/2022 1317   APPEARANCEUR CLOUDY (A) 11/26/2022 1317   LABSPEC 1.015 11/26/2022 1317   PHURINE 6.0 11/26/2022 1317   GLUCOSEU NEGATIVE 11/26/2022 1317   HGBUR LARGE (A) 11/26/2022 1317   BILIRUBINUR NEGATIVE 11/26/2022 1317   BILIRUBINUR negative 04/19/2019 1354   KETONESUR NEGATIVE 11/26/2022 1317   PROTEINUR 100 (A) 11/26/2022 1317   UROBILINOGEN 0.2 04/19/2019 1354   UROBILINOGEN  1.0 05/21/2015 1050   NITRITE NEGATIVE 11/26/2022 1317   LEUKOCYTESUR MODERATE (A) 11/26/2022 1317    Lab Results  Component Value Date   BACTERIA NONE SEEN 11/26/2022    Pertinent Imaging:  No results  found for this or any previous visit.  No results found for this or any previous visit.  No results found for this or any previous visit.  No results found for this or any previous visit.  No results found for this or any previous visit.  No valid procedures specified. No results found for this or any previous visit.  No results found for this or any previous visit.   Assessment & Plan:    1. Acute urinary retention -We discussed chronic indwelling foley versus voiding trial. After discussing the options the patient and family have elected for a chronic indwelling foley until the patient is stronger and can transfer. Followup 1 month for possible voiding trial.   2. Benign prostatic hyperplasia with urinary obstruction -continue uroxatral '10mg'$  and finasteride '5mg'$   3. Prostate cancer ([place]) -management per [facility]    No follow-ups on file.  Nicolette Bang, MD  [facility] [place]

## 2022-12-21 ENCOUNTER — Encounter (INDEPENDENT_AMBULATORY_CARE_PROVIDER_SITE_OTHER): Payer: Self-pay

## 2022-12-22 ENCOUNTER — Telehealth (INDEPENDENT_AMBULATORY_CARE_PROVIDER_SITE_OTHER): Payer: Medicare Other | Admitting: Gastroenterology

## 2022-12-30 ENCOUNTER — Encounter: Payer: Self-pay | Admitting: Urology

## 2022-12-31 ENCOUNTER — Encounter (INDEPENDENT_AMBULATORY_CARE_PROVIDER_SITE_OTHER): Payer: Self-pay | Admitting: Gastroenterology

## 2022-12-31 ENCOUNTER — Emergency Department (HOSPITAL_COMMUNITY): Payer: Medicare Other

## 2022-12-31 ENCOUNTER — Encounter (HOSPITAL_COMMUNITY): Payer: Self-pay

## 2022-12-31 ENCOUNTER — Other Ambulatory Visit: Payer: Self-pay

## 2022-12-31 ENCOUNTER — Inpatient Hospital Stay (HOSPITAL_COMMUNITY)
Admission: EM | Admit: 2022-12-31 | Discharge: 2023-01-03 | DRG: 193 | Disposition: A | Discharge status: Home Health | Payer: Medicare Other | Attending: Internal Medicine | Admitting: Internal Medicine

## 2022-12-31 DIAGNOSIS — Z8249 Family history of ischemic heart disease and other diseases of the circulatory system: Secondary | ICD-10-CM

## 2022-12-31 DIAGNOSIS — R531 Weakness: Secondary | ICD-10-CM

## 2022-12-31 DIAGNOSIS — L89152 Pressure ulcer of sacral region, stage 2: Secondary | ICD-10-CM | POA: Diagnosis present

## 2022-12-31 DIAGNOSIS — J121 Respiratory syncytial virus pneumonia: Principal | ICD-10-CM | POA: Diagnosis present

## 2022-12-31 DIAGNOSIS — M353 Polymyalgia rheumatica: Secondary | ICD-10-CM | POA: Diagnosis present

## 2022-12-31 DIAGNOSIS — I5032 Chronic diastolic (congestive) heart failure: Secondary | ICD-10-CM | POA: Diagnosis present

## 2022-12-31 DIAGNOSIS — Z91041 Radiographic dye allergy status: Secondary | ICD-10-CM

## 2022-12-31 DIAGNOSIS — Z800 Family history of malignant neoplasm of digestive organs: Secondary | ICD-10-CM

## 2022-12-31 DIAGNOSIS — D500 Iron deficiency anemia secondary to blood loss (chronic): Secondary | ICD-10-CM | POA: Diagnosis present

## 2022-12-31 DIAGNOSIS — Z888 Allergy status to other drugs, medicaments and biological substances status: Secondary | ICD-10-CM

## 2022-12-31 DIAGNOSIS — J111 Influenza due to unidentified influenza virus with other respiratory manifestations: Principal | ICD-10-CM

## 2022-12-31 DIAGNOSIS — B338 Other specified viral diseases: Secondary | ICD-10-CM

## 2022-12-31 DIAGNOSIS — Z79899 Other long term (current) drug therapy: Secondary | ICD-10-CM

## 2022-12-31 DIAGNOSIS — Z91040 Latex allergy status: Secondary | ICD-10-CM

## 2022-12-31 DIAGNOSIS — J9601 Acute respiratory failure with hypoxia: Secondary | ICD-10-CM | POA: Diagnosis present

## 2022-12-31 DIAGNOSIS — E1122 Type 2 diabetes mellitus with diabetic chronic kidney disease: Secondary | ICD-10-CM | POA: Diagnosis present

## 2022-12-31 DIAGNOSIS — Z1152 Encounter for screening for COVID-19: Secondary | ICD-10-CM

## 2022-12-31 DIAGNOSIS — K219 Gastro-esophageal reflux disease without esophagitis: Secondary | ICD-10-CM | POA: Diagnosis present

## 2022-12-31 DIAGNOSIS — J690 Pneumonitis due to inhalation of food and vomit: Secondary | ICD-10-CM | POA: Diagnosis present

## 2022-12-31 DIAGNOSIS — E876 Hypokalemia: Secondary | ICD-10-CM | POA: Diagnosis present

## 2022-12-31 DIAGNOSIS — Z981 Arthrodesis status: Secondary | ICD-10-CM

## 2022-12-31 DIAGNOSIS — B952 Enterococcus as the cause of diseases classified elsewhere: Secondary | ICD-10-CM | POA: Diagnosis present

## 2022-12-31 DIAGNOSIS — R532 Functional quadriplegia: Secondary | ICD-10-CM | POA: Diagnosis present

## 2022-12-31 DIAGNOSIS — I110 Hypertensive heart disease with heart failure: Secondary | ICD-10-CM | POA: Diagnosis present

## 2022-12-31 DIAGNOSIS — N390 Urinary tract infection, site not specified: Secondary | ICD-10-CM | POA: Diagnosis present

## 2022-12-31 DIAGNOSIS — L89222 Pressure ulcer of left hip, stage 2: Secondary | ICD-10-CM | POA: Diagnosis present

## 2022-12-31 DIAGNOSIS — J189 Pneumonia, unspecified organism: Secondary | ICD-10-CM | POA: Insufficient documentation

## 2022-12-31 DIAGNOSIS — F0150 Vascular dementia without behavioral disturbance: Secondary | ICD-10-CM | POA: Diagnosis present

## 2022-12-31 DIAGNOSIS — Z885 Allergy status to narcotic agent status: Secondary | ICD-10-CM

## 2022-12-31 DIAGNOSIS — R399 Unspecified symptoms and signs involving the genitourinary system: Secondary | ICD-10-CM | POA: Diagnosis present

## 2022-12-31 DIAGNOSIS — R0902 Hypoxemia: Secondary | ICD-10-CM

## 2022-12-31 DIAGNOSIS — Z818 Family history of other mental and behavioral disorders: Secondary | ICD-10-CM

## 2022-12-31 DIAGNOSIS — I69398 Other sequelae of cerebral infarction: Secondary | ICD-10-CM

## 2022-12-31 DIAGNOSIS — Z96652 Presence of left artificial knee joint: Secondary | ICD-10-CM | POA: Diagnosis present

## 2022-12-31 DIAGNOSIS — E7800 Pure hypercholesterolemia, unspecified: Secondary | ICD-10-CM | POA: Diagnosis present

## 2022-12-31 DIAGNOSIS — C61 Malignant neoplasm of prostate: Secondary | ICD-10-CM | POA: Diagnosis present

## 2022-12-31 DIAGNOSIS — E1142 Type 2 diabetes mellitus with diabetic polyneuropathy: Secondary | ICD-10-CM | POA: Diagnosis present

## 2022-12-31 DIAGNOSIS — Z87891 Personal history of nicotine dependence: Secondary | ICD-10-CM

## 2022-12-31 LAB — COMPREHENSIVE METABOLIC PANEL
ALT: 7 U/L (ref 0–44)
AST: 15 U/L (ref 15–41)
Albumin: 2.7 g/dL — ABNORMAL LOW (ref 3.5–5.0)
Alkaline Phosphatase: 57 U/L (ref 38–126)
Anion gap: 11 (ref 5–15)
BUN: 16 mg/dL (ref 8–23)
CO2: 26 mmol/L (ref 22–32)
Calcium: 9.1 mg/dL (ref 8.9–10.3)
Chloride: 100 mmol/L (ref 98–111)
Creatinine, Ser: 0.74 mg/dL (ref 0.61–1.24)
GFR, Estimated: >60 mL/min (ref >60)
Glucose, Bld: 143 mg/dL — ABNORMAL HIGH (ref 70–99)
Potassium: 3.7 mmol/L (ref 3.5–5.1)
Sodium: 137 mmol/L (ref 135–145)
Total Bilirubin: 0.7 mg/dL (ref 0.3–1.2)
Total Protein: 6.8 g/dL (ref 6.5–8.1)

## 2022-12-31 LAB — CBC WITH DIFFERENTIAL/PLATELET
Abs Immature Granulocytes: 0.09 10*3/uL — ABNORMAL HIGH (ref 0.00–0.07)
Basophils Absolute: 0 10*3/uL (ref 0.0–0.1)
Basophils Relative: 0 %
Eosinophils Absolute: 0.1 10*3/uL (ref 0.0–0.5)
Eosinophils Relative: 2 %
HCT: 31.2 % — ABNORMAL LOW (ref 39.0–52.0)
Hemoglobin: 9.5 g/dL — ABNORMAL LOW (ref 13.0–17.0)
Immature Granulocytes: 1 %
Lymphocytes Relative: 7 %
Lymphs Abs: 0.6 10*3/uL — ABNORMAL LOW (ref 0.7–4.0)
MCH: 28.1 pg (ref 26.0–34.0)
MCHC: 30.4 g/dL (ref 30.0–36.0)
MCV: 92.3 fL (ref 80.0–100.0)
Monocytes Absolute: 0.8 10*3/uL (ref 0.1–1.0)
Monocytes Relative: 9 %
Neutro Abs: 6.9 10*3/uL (ref 1.7–7.7)
Neutrophils Relative %: 81 %
Platelets: 255 10*3/uL (ref 150–400)
RBC: 3.38 MIL/uL — ABNORMAL LOW (ref 4.22–5.81)
RDW: 17.5 % — ABNORMAL HIGH (ref 11.5–15.5)
WBC: 8.6 10*3/uL (ref 4.0–10.5)
nRBC: 0 % (ref 0.0–0.2)

## 2022-12-31 LAB — URINALYSIS, ROUTINE W REFLEX MICROSCOPIC
Bilirubin Urine: NEGATIVE
Glucose, UA: NEGATIVE mg/dL
Ketones, ur: NEGATIVE mg/dL
Nitrite: NEGATIVE
Protein, ur: 100 mg/dL — AB
RBC / HPF: >50 RBC/hpf (ref 0–5)
Specific Gravity, Urine: 1.026 (ref 1.005–1.030)
WBC, UA: >50 WBC/hpf (ref 0–5)
pH: 5 (ref 5.0–8.0)

## 2022-12-31 LAB — RESP PANEL BY RT-PCR (RSV, FLU A&B, COVID)  RVPGX2
Influenza A by PCR: NEGATIVE
Influenza B by PCR: NEGATIVE
Resp Syncytial Virus by PCR: POSITIVE — AB
SARS Coronavirus 2 by RT PCR: NEGATIVE

## 2022-12-31 LAB — PROCALCITONIN: Procalcitonin: 0.19 ng/mL

## 2022-12-31 MED ORDER — HEPARIN SODIUM (PORCINE) 5000 UNIT/ML IJ SOLN
5000.0000 [IU] | Freq: Three times a day (TID) | INTRAMUSCULAR | Status: DC
  Administered 2023-01-01: 5000 [IU] via SUBCUTANEOUS
  Filled 2022-12-31: qty 1

## 2022-12-31 MED ORDER — ZOLPIDEM TARTRATE 5 MG PO TABS
5.0000 mg | ORAL_TABLET | Freq: Every evening | ORAL | Status: DC | PRN
  Administered 2022-12-31 – 2023-01-02 (×3): 5 mg via ORAL
  Filled 2022-12-31 (×3): qty 1

## 2022-12-31 MED ORDER — DM-GUAIFENESIN ER 30-600 MG PO TB12
1.0000 | ORAL_TABLET | Freq: Every day | ORAL | Status: DC | PRN
  Administered 2022-12-31: 1 via ORAL
  Filled 2022-12-31: qty 1

## 2022-12-31 MED ORDER — METRONIDAZOLE 500 MG/100ML IV SOLN
500.0000 mg | Freq: Once | INTRAVENOUS | Status: AC
  Administered 2022-12-31: 500 mg via INTRAVENOUS
  Filled 2022-12-31: qty 100

## 2022-12-31 MED ORDER — PANTOPRAZOLE SODIUM 40 MG PO TBEC
40.0000 mg | DELAYED_RELEASE_TABLET | Freq: Every day | ORAL | Status: DC
  Administered 2023-01-01 – 2023-01-03 (×3): 40 mg via ORAL
  Filled 2022-12-31 (×3): qty 1

## 2022-12-31 MED ORDER — ACETAMINOPHEN 650 MG RE SUPP: 650.0000 mg | Freq: Four times a day (QID) | RECTAL | Status: DC | PRN

## 2022-12-31 MED ORDER — ACETAMINOPHEN 325 MG PO TABS: 650.0000 mg | ORAL_TABLET | Freq: Four times a day (QID) | ORAL | Status: DC | PRN

## 2022-12-31 MED ORDER — ONDANSETRON HCL 4 MG PO TABS: 4.0000 mg | ORAL_TABLET | Freq: Four times a day (QID) | ORAL | Status: DC | PRN

## 2022-12-31 MED ORDER — ALBUTEROL SULFATE (2.5 MG/3ML) 0.083% IN NEBU: 2.5000 mg | INHALATION_SOLUTION | RESPIRATORY_TRACT | Status: DC | PRN

## 2022-12-31 MED ORDER — SODIUM CHLORIDE 0.9 % IV SOLN
3.0000 g | Freq: Four times a day (QID) | INTRAVENOUS | Status: DC
  Administered 2023-01-01 – 2023-01-03 (×9): 3 g via INTRAVENOUS
  Filled 2022-12-31 (×9): qty 8

## 2022-12-31 MED ORDER — PREGABALIN 75 MG PO CAPS
75.0000 mg | ORAL_CAPSULE | Freq: Two times a day (BID) | ORAL | Status: DC
  Administered 2022-12-31 – 2023-01-03 (×6): 75 mg via ORAL
  Filled 2022-12-31 (×6): qty 1

## 2022-12-31 MED ORDER — FINASTERIDE 5 MG PO TABS
5.0000 mg | ORAL_TABLET | Freq: Every day | ORAL | Status: DC
  Administered 2023-01-01 – 2023-01-03 (×3): 5 mg via ORAL
  Filled 2022-12-31 (×3): qty 1

## 2022-12-31 MED ORDER — SODIUM CHLORIDE 0.9 % IV SOLN
1.0000 g | Freq: Once | INTRAVENOUS | Status: AC
  Administered 2022-12-31: 1 g via INTRAVENOUS
  Filled 2022-12-31: qty 10

## 2022-12-31 MED ORDER — ONDANSETRON HCL 4 MG/2ML IJ SOLN: 4.0000 mg | Freq: Four times a day (QID) | INTRAMUSCULAR | Status: DC | PRN

## 2022-12-31 MED ORDER — FERROUS SULFATE 325 (65 FE) MG PO TABS
325.0000 mg | ORAL_TABLET | Freq: Two times a day (BID) | ORAL | Status: DC
  Administered 2022-12-31 – 2023-01-03 (×6): 325 mg via ORAL
  Filled 2022-12-31 (×6): qty 1

## 2022-12-31 MED ORDER — ENZALUTAMIDE 80 MG PO TABS: 80.0000 mg | ORAL_TABLET | Freq: Every day | ORAL | Status: DC

## 2022-12-31 MED ORDER — ALFUZOSIN HCL ER 10 MG PO TB24
10.0000 mg | ORAL_TABLET | Freq: Every day | ORAL | Status: DC
  Administered 2023-01-01 – 2023-01-03 (×3): 10 mg via ORAL
  Filled 2022-12-31 (×3): qty 1

## 2022-12-31 MED ORDER — CHLORHEXIDINE GLUCONATE CLOTH 2 % EX PADS
6.0000 | MEDICATED_PAD | Freq: Every day | CUTANEOUS | Status: DC
  Administered 2023-01-01 – 2023-01-03 (×3): 6 via TOPICAL

## 2022-12-31 MED ORDER — SODIUM CHLORIDE 0.9 % IV SOLN
500.0000 mg | Freq: Once | INTRAVENOUS | Status: AC
  Administered 2022-12-31: 500 mg via INTRAVENOUS
  Filled 2022-12-31: qty 5

## 2022-12-31 MED ORDER — ATORVASTATIN CALCIUM 40 MG PO TABS
40.0000 mg | ORAL_TABLET | Freq: Every day | ORAL | Status: DC
  Administered 2023-01-01 – 2023-01-03 (×3): 40 mg via ORAL
  Filled 2022-12-31 (×3): qty 1

## 2022-12-31 MED ORDER — OXYCODONE HCL 5 MG PO TABS: 5.0000 mg | ORAL_TABLET | ORAL | Status: DC | PRN

## 2022-12-31 MED ORDER — VITAMIN B-12 100 MCG PO TABS
500.0000 ug | ORAL_TABLET | Freq: Every day | ORAL | Status: DC
  Administered 2023-01-01 – 2023-01-03 (×3): 500 ug via ORAL
  Filled 2022-12-31 (×3): qty 5

## 2022-12-31 NOTE — Assessment & Plan Note (Signed)
-  RSV positive - CT chest shows left lower lobe pneumonia, and possible aspiration in the right lower lobe - Chest x-ray shows basilar opacity that is increased from prior exam - Patient was started on Rocephin, Zithromax, Flagyl - Given the suspicion for aspiration we will continue on Unasyn - Expectorated sputum culture - Strep and Legionella urine antigens - Blood culture pending, unfortunately already started on antibiotics - Procalcitonin pending - Continue to monitor

## 2022-12-31 NOTE — ED Provider Notes (Addendum)
[facility] Provider Note   CSN: [id number] Arrival date & time: 12/31/22  1523     History  Chief Complaint  Patient presents with   Cough    Jared Bridges is a 89 y.o. male.  Patient brought in by [facility] rescue squad complaining of cough congestion and weakness for a week.  Patient is nonambulatory normally on 2 L of nasal cannula oxygen and at all times.  And has a Foley catheter in place.  Temp on arrival 97.6 pulse 67 blood pressure 118/50 respiration 718 and oxygen sats 97% on room air.  Past medical history sniffing for type 2 diabetes chronic kidney disease TIAs and 2019 acute cholecystitis 2017 had gallbladder removed.  Diverticulitis high cholesterol peptic ulcer disease polymyalgia or rheumatic hypertension stroke residual trouble reading and writing.  Prostate cancer.  Gallbladder removed in 2017 patient is a former smoker quit in 1988 patient seen last in the emergency department November 26, 2022 with abdominal pain generalized weakness.  Was admitted at that time.  Patient with iodinated dye allergy.  But no antibiotic allergies.  Patient is from home.  Correction: Patient has not been on home oxygen for several weeks.  Has required it in the past.       Home Medications Prior to Admission medications   Medication Sig Start Date End Date Taking? Authorizing Provider  alfuzosin (UROXATRAL) 10 MG 24 hr tablet Take 1 tablet (10 mg total) by mouth daily with breakfast. 12/08/22  Yes McKenzie, Candee Furbish, MD  Apremilast (OTEZLA) 30 MG TABS Take 30 mg by mouth 2 (two) times daily.   Yes [provider]  atorvastatin (LIPITOR) 40 MG tablet Take 1 tablet (40 mg total) by mouth daily. 07/09/22  Yes Gerlene Fee, NP  Calcium 200 MG TABS Take 400 mg by mouth in the morning and at bedtime.   Yes [provider]  Cholecalciferol (VITAMIN D3) 250 MCG (10000 UT) TABS Take 1,000 Units by mouth daily.   Yes [provider]  dextromethorphan-guaiFENesin (MUCINEX DM) 30-600 MG 12hr tablet Take 1 tablet by mouth daily as needed for cough.   Yes [provider]  Diphenhyd-Hydrocort-Nystatin (FIRST-DUKES MOUTHWASH MT) Use as directed 5 mLs in the mouth or throat 4 (four) times daily.   Yes [provider]  enzalutamide (XTANDI) 80 MG tablet Take 80 mg by mouth daily.   Yes [provider]  ferrous sulfate 325 (65 FE) MG tablet Take 1 tablet (325 mg total) by mouth 2 (two) times daily with a meal. Mon, wed, fri Patient taking differently: Take 325 mg by mouth 2 (two) times daily. 08/15/22  Yes Johnson, Clanford L, MD  finasteride (PROSCAR) 5 MG tablet Take 1 tablet (5 mg total) by mouth daily. 12/08/22  Yes McKenzie, Candee Furbish, MD  furosemide (LASIX) 20 MG tablet Take 1 tablet (20 mg total) by mouth every other day. Tuesday, Thursday and Saturday 08/15/22 12/31/22 Yes Johnson, Clanford L, MD  magic mouthwash (nystatin, lidocaine, diphenhydrAMINE, alum & mag hydroxide) suspension Swish and spit 5 mLs 4 (four) times daily as needed for mouth pain. 11/28/22  Yes Pokhrel, Laxman, MD  pantoprazole (PROTONIX) 40 MG tablet Take 1 tablet (40 mg total) by mouth 2 (two) times daily. Patient taking differently: Take 40 mg by mouth daily. 08/15/22 08/15/23 Yes Johnson, Clanford L, MD  pregabalin (LYRICA) 75 MG capsule Take 1 capsule (75 mg total) by mouth 2 (two) times daily. 07/09/22  Yes Gerlene Fee, NP  topiramate (TOPAMAX) 50 MG tablet Take 0.5 tablets (25 mg total) by mouth 2 (two) times daily. Patient taking differently: Take 50 mg by mouth 2 (two) times daily. 07/09/22  Yes Gerlene Fee, NP  vitamin B-12 (CYANOCOBALAMIN) 500 MCG tablet Take 500 mcg by mouth daily.   Yes [provider]  zolpidem (AMBIEN) 5 MG tablet Take 5-10 mg by mouth at bedtime as needed for sleep. 07/09/22  Yes [provider]  cherry syrup syrup Take by mouth. 11/28/22   [provider]   mirtazapine (REMERON) 15 MG tablet Take 1 tablet (15 mg total) by mouth at bedtime. Patient not taking: Reported on 12/31/2022 11/20/22   Gabriel Rung, NP      Allergies    Iodine, Calan [verapamil], Cymbalta [duloxetine hcl], Iodinated contrast media, Latex, Lisinopril, Neurontin [gabapentin], Zoloft [sertraline hcl], Valium [diazepam], Flomax [tamsulosin hcl], Glimepiride, Melatonin, and Tape    Review of Systems   Review of Systems  Constitutional:  Positive for fatigue. Negative for chills and fever.  HENT:  Positive for congestion. Negative for rhinorrhea and sore throat.   Eyes:  Negative for visual disturbance.  Respiratory:  Positive for cough. Negative for shortness of breath.   Cardiovascular:  Negative for chest pain and leg swelling.  Gastrointestinal:  Negative for abdominal pain, diarrhea, nausea and vomiting.  Genitourinary:  Negative for dysuria.  Musculoskeletal:  Negative for back pain and neck pain.  Skin:  Negative for rash.  Neurological:  Negative for dizziness, light-headedness and headaches.  Hematological:  Does not bruise/bleed easily.  Psychiatric/Behavioral:  Negative for confusion.     Physical Exam Updated Vital Signs BP (!) 126/57   Pulse 71   Temp 97.6 F (36.4 C) (Oral)   Resp 17   Ht 1.854 m ('6\' 1"'$ )   Wt 77 kg   SpO2 (!) 88%   BMI 22.40 kg/m  Physical Exam Vitals and nursing note reviewed.  Constitutional:      General: He is not in acute distress.    Appearance: Normal appearance. He is well-developed.  HENT:     Head: Normocephalic and atraumatic.  Eyes:     Conjunctiva/sclera: Conjunctivae normal.  Cardiovascular:     Rate and Rhythm: Normal rate and regular rhythm.     Heart sounds: No murmur heard. Pulmonary:     Effort: Pulmonary effort is normal. No respiratory distress.     Breath sounds: No stridor. Rhonchi present. No wheezing.  Abdominal:     Palpations: Abdomen is soft.     Tenderness: There is no abdominal  tenderness.  Musculoskeletal:        General: No swelling.     Cervical back: Normal range of motion and neck supple. No rigidity.  Skin:    General: Skin is warm and dry.     Capillary Refill: Capillary refill takes less than 2 seconds.  Neurological:     Mental Status: He is alert. Mental status is at baseline.  Psychiatric:        Mood and Affect: Mood normal.     ED Results / Procedures / Treatments   Labs (all labs ordered are listed, but only abnormal results are displayed) Labs Reviewed  RESP PANEL BY RT-PCR (RSV, FLU A&B, COVID)  RVPGX2 - Abnormal; Notable for the following components:      Result Value   Resp Syncytial Virus by PCR POSITIVE (*)    All other components within normal limits  CBC WITH DIFFERENTIAL/PLATELET - Abnormal; Notable for the following components:   RBC 3.38 (*)    Hemoglobin 9.5 (*)    HCT 31.2 (*)    RDW 17.5 (*)    Lymphs Abs 0.6 (*)    Abs Immature Granulocytes 0.09 (*)    All other components within normal limits  COMPREHENSIVE METABOLIC PANEL - Abnormal; Notable for the following components:   Glucose, Bld 143 (*)    Albumin 2.7 (*)    All other components within normal limits    EKG None  Radiology CT Chest Wo Contrast  Result Date: 12/31/2022 CLINICAL DATA:  Cough and congestion EXAM: CT CHEST WITHOUT CONTRAST TECHNIQUE: Multidetector CT imaging of the chest was performed following the standard protocol without IV contrast. RADIATION DOSE REDUCTION: This exam was performed according to the departmental dose-optimization program which includes automated exposure control, adjustment of the mA and/or kV according to patient size and/or use of iterative reconstruction technique. COMPARISON:  Chest x-ray same day. CT chest abdomen and pelvis 04/27/2022. FINDINGS: Cardiovascular: There is aneurysmal dilatation of the ascending aorta measuring 4.2 cm. Distal aortic arch is dilated measuring 4 cm. There are atherosclerotic calcifications of the  aorta. Heart is normal in size. There is no pericardial effusion. Mediastinum/Nodes: There are numerous bilateral thyroid nodules measuring up to 8 mm. Thyroid calcifications are present. There has been no significant interval change. There are no enlarged mediastinal, hilar or axillary lymph nodes. Esophagus is within normal limits. Lungs/Pleura: There is a small left pleural effusion. There is patchy atelectasis and airspace disease in the left lower lobe with some tree-in-bud opacities. There some multifocal mild patchy ground-glass opacities in the right lower lobe. There is a new nodular density in the superior segment of the left lower lobe measuring 3 mm image 4/37. There also few new 2 mm pulmonary nodules in the right middle lobe and a few new scattered sub 2 mm nodules in the left upper lobe. Otherwise, scattered right-sided pulmonary nodules measuring 3 mm or less appear stable. There is no evidence for pneumothorax. There secretions in the distal trachea, left mainstem bronchus and left lower lobe bronchus. Upper Abdomen: No acute abnormality. Gallbladder is surgically absent. Musculoskeletal: Degenerative changes affect the spine. IMPRESSION: 1. Small left pleural effusion. 2. Left lower lobe atelectasis and airspace disease with tree-in-bud opacities worrisome for pneumonia. There secretions in the trachea, left mainstem and left lower lobe bronchus worrisome for aspiration. 3. Mild patchy ground-glass opacities in the right lower lobe may be infectious/inflammatory. 4. Multiple new pulmonary nodules measuring up to 3 mm. Per Fleischner Society Guidelines, if patient is low risk for malignancy, no routine follow-up imaging is recommended. If patient is high risk for malignancy, a non-contrast Chest CT at 12 months is optional. If performed and the nodule is stable at 12 months, no further follow-up is recommended. These guidelines do not apply to immunocompromised patients and patients with cancer.  Follow up in patients with significant comorbidities as clinically warranted. For lung cancer screening, adhere to Lung-RADS guidelines. Reference: Radiology. 2017; 284(1):228-43. 5. Stable aneurysmal dilatation of the ascending thoracic aorta and distal aortic arch measuring up to 4.2 cm. Recommend annual imaging followup by CTA or MRA. This recommendation follows 2010 ACCF/AHA/AATS/ACR/ASA/SCA/SCAI/SIR/STS/SVM Guidelines for the Diagnosis and Management of Patients with Thoracic Aortic Disease. Circulation. 2010; 121: D638-V564. Aortic aneurysm NOS (ICD10-I71.9) 6. Stable thyroid nodules. Aortic Atherosclerosis (ICD10-I70.0). Electronically Signed   By: Ronney Asters M.D.   On: 12/31/2022 18:55   DG  Chest 2 View  Result Date: 12/31/2022 CLINICAL DATA:  Cough and congestion. Weakness. EXAM: CHEST - 2 VIEW COMPARISON:  Radiographs 08/10/2022, CT 04/27/2022 FINDINGS: Patient is rotated. The heart is normal in size. Aortic atherosclerosis. There is a small left pleural effusion with associated basilar opacity, slightly increased from prior exam. The right lung is clear. No pulmonary edema. No pneumothorax. IMPRESSION: Small left pleural effusion with associated basilar opacity, slightly increased from prior exam. Electronically Signed   By: Keith Rake M.D.   On: 12/31/2022 17:28    Procedures Procedures    Medications Ordered in ED Medications  azithromycin (ZITHROMAX) 500 mg in sodium chloride 0.9 % 250 mL IVPB (500 mg Intravenous New Bag/Given 12/31/22 2032)  metroNIDAZOLE (FLAGYL) IVPB 500 mg (has no administration in time range)  cefTRIAXone (ROCEPHIN) 1 g in sodium chloride 0.9 % 100 mL IVPB (0 g Intravenous Stopped 12/31/22 2008)    ED Course/ Medical Decision Making/ A&P                             Medical Decision Making Amount and/or Complexity of Data Reviewed Labs: ordered. Radiology: ordered.  Risk Decision regarding hospitalization.   Patient seems to be presenting with  flulike illness.  Will get respiratory panel.  Chest x-ray two-view since he is in the hallway.  Patient normally on 2 L of oxygen at all times oxygen sats are good on that.  Respiratory rate not up.  Will start IV and get labs.  Along with the two-view chest x-ray.  And respiratory panel.  Respiratory panel positive for RSV.  No leukocytosis.  Chest x-ray raise concerns about opacities and pleural effusion.  CT chest without was done that showed a small left pleural effusion left lower lobe atelectasis and airspace disease with tree-in-bud opacities worrisome for pneumonia.  Also lots of secretions in the left mainstem left lower bronchus worrisome for aspiration.  Mild patchy groundglass opacities in the right lower lobe as well.  Several pulmonary nodules that may require workup.  Patient's oxygen was turned off his sats went down to 87% 88% range.  Patient has new requirement for oxygen and also concerns for aspiration of the pneumonia.  Patient treated with Rocephin and Zithromax.  Will throw and Flagyl as well.  Will contact hospitalist for admission  All findings discussed with patient's daughter she agrees with the plan for admission. Final Clinical Impression(s) / ED Diagnoses Final diagnoses:  Influenza-like illness  RSV (respiratory syncytial virus infection)  Hypoxia  Community acquired pneumonia of left lower lobe of lung    Rx / DC Orders ED Discharge Orders     None         Fredia Sorrow, MD 12/31/22 1609    Fredia Sorrow, MD 12/31/22 1610    Fredia Sorrow, MD 12/31/22 1713    Fredia Sorrow, MD 12/31/22 2036

## 2022-12-31 NOTE — Assessment & Plan Note (Addendum)
-  Patient has used oxygen in the past but that was discontinued until he developed infectious symptoms last week - Currently requiring 2 L nasal cannula - Likely secondary to RSV with possible close aspiration pneumonia - Procalcitonin pending - See plan for pneumonia - Continue oxygen supplementation weaning off as tolerated - Albuterol for shortness of breath or wheezing - Speech swallow eval to assess for aspiration - Continue to monitor

## 2022-12-31 NOTE — Assessment & Plan Note (Addendum)
-  Continue iron and B12 - Hemoglobin currently stable at 9.5 - No overt signs of bleeding - Trend with a.m. labs

## 2022-12-31 NOTE — Assessment & Plan Note (Signed)
-  Holding diuretic in setting of acute illness - Monitor for signs of fluid overload - Continue to monitor

## 2022-12-31 NOTE — Assessment & Plan Note (Addendum)
-  Continue alfuzosin and finasteride - Monitor urine output - Urine pending

## 2022-12-31 NOTE — H&P (Signed)
History and Physical    Patient: Jared Bridges OQH:[id number] DOB: 06-Dec-1929 DOA: 12/31/2022 DOS: the patient was seen and examined on 12/31/2022 PCP: Juluis Pitch, MD  Patient coming from: Home  Chief Complaint:  Chief Complaint  Patient presents with   Cough   HPI: Jared Bridges is a 89 y.o. male with medical history significant of iron deficiency anemia, CKD, essential hypertension, hyperlipidemia, vascular dementia, polymyalgia rheumatica, type 2 diabetes diet-controlled, and more presents the ED with a chief complaint of cough and generalized weakness.  Patient is oriented to self but not to place or year so is not able to provide any history.  He does note that he has been coughing, and reports that that cough is productive of yellow sputum.  When asked any other questions, he continues to report that the sputum is yellow.  Family had told the ER provider that patient has been on room air at home and maintaining his oxygen saturations until he developed these infectious symptoms about a week ago.  In the ED he was maintaining oxygen sats 87% on room air, but corrected on 2 L nasal cannula.  Patient is nonambulatory at home, lives with daughter.  Further history could not be obtained at this time.  Full code per default as there is no form at bedside stating otherwise, and patient is not able to make this decision at this time. Review of Systems: unable to review all systems due to the inability of the patient to answer questions. Past Medical History:  Diagnosis Date   Acute cholecystitis 04/05/2016   Anemia, iron deficiency    Chronic headaches    Migraines   Chronic kidney disease    Resolved   Depression    Diverticulitis    Essential hypertension    GI bleed    Dr. Laural Golden - 1998   Hypercholesteremia    Orthostatic hypotension    Peptic ulcer disease    Peripheral neuropathy    Polymyalgia rheumatica ([name]    Prostate cancer ([place])    Rectal bleeding 11/26/2017    Added automatically from request for surgery [id number]   Stroke [facility])    Residual trouble reading and writing   Syncope    Thyroid nodule    TIA (transient ischemic attack) 12/2017   Type 2 diabetes mellitus ([place])    Upper GI bleed 06/18/2012   Past Surgical History:  Procedure Laterality Date   APPENDECTOMY     as child   BACK SURGERY     2000 IDET SPINAL PROC   CHOLECYSTECTOMY N/A 04/07/2016   Procedure: CHOLECYSTECTOMY;  Surgeon: Aviva Signs, MD;  Location: [facility];  Service: General;  Laterality: N/A;   [place]   COLONOSCOPY N/A 01/07/2018   Procedure: COLONOSCOPY;  Surgeon: Rogene Houston, MD;  Location[facility];  Service: Endoscopy;  Laterality: N/A;  1:00   COLONOSCOPY WITH PROPOFOL N/A 08/12/2022   Procedure: COLONOSCOPY WITH PROPOFOL;  Surgeon: Harvel Quale, MD;  Location: [facility];  Service: Gastroenterology;  Laterality: N/A;  ASA 3   CRYOTHERAPY     ESOPHAGOGASTRODUODENOSCOPY (EGD) WITH PROPOFOL N/A 08/09/2022   Procedure: ESOPHAGOGASTRODUODENOSCOPY (EGD) WITH PROPOFOL;  Surgeon: Harvel Quale, MD;  [facility];  Service: Gastroenterology;  Laterality: N/A;   EYE SURGERY     right cataract with lens implant   FEMUR SURGERY     RT LEG  [place] STUDY N/A 08/13/2022   Procedure: GIVENS CAPSULE STUDY;  Surgeon: Harvel Quale, MD;  Location: [facility];  Service: Gastroenterology;  Laterality: N/A;   HAND SURGERY     RIGHT   HERNIA REPAIR     1-right inguinal, 3- left inguinal   IRRIGATION AND DEBRIDEMENT KNEE Left 07/09/2015   Procedure: LEFT KNEE IRRIGATION AND DEBRIDEMENT WOUND CLOSURE;  Surgeon: Gaynelle Arabian, MD;  Location: [facility];  Service: Orthopedics;  Laterality: Left;   JOINT REPLACEMENT  02/28/2016   lt knee revision   KNEE DEBRIDEMENT     2016   LEFT   MENISCUS REPAIR     LEFT   POLYPECTOMY  01/07/2018   Procedure: POLYPECTOMY;  Surgeon: Rogene Houston, MD;  [facility];  Service: Endoscopy;;  colon    POLYPECTOMY  08/12/2022   Procedure: POLYPECTOMY;  Surgeon: Harvel Quale, MD;  [facility];  Service: Gastroenterology;;   SHOULDER OPEN ROTATOR CUFF REPAIR     RT SHOULDER   SPINAL FUSION     SPINAL FUSION     TONSILLECTOMY     TOTAL KNEE ARTHROPLASTY Left 05/28/2015   Procedure: LEFT TOTAL KNEE ARTHROPLASTY;  Surgeon: Gaynelle Arabian, MD;  Location: [facility];  Service: Orthopedics;  Laterality: Left;   TOTAL KNEE REVISION Left 02/28/2016   Procedure: LEFT TOTAL KNEE REVISION;  Surgeon: Leandrew Koyanagi, MD;  Location: [facility];  Service: Orthopedics;  Laterality: Left;   TRIGGER FINGER RELEASE     RT HAND   1990S   UVULECTOMY  2004   TURBINATE,TONSIL,ADENOID   Social History:  reports that he quit smoking about 36 years ago. His smoking use included cigars and cigarettes. He started smoking about 64 years ago. He has a 1.80 pack-year smoking history. He has never used smokeless tobacco. He reports that he does not drink alcohol and does not use drugs.  Allergies  Allergen Reactions   Iodine Hives, Itching and Other (See Comments)    very allergic(per daughter), can pre-med with benadryl   Calan [Verapamil] Other (See Comments)    Weakness   Cymbalta [Duloxetine Hcl] Other (See Comments)    Makes patient have jerking motions.   Iodinated Contrast Media Hives, Itching and Other (See Comments)    Can pre-med with benadryl   Latex Other (See Comments)    Redness iritation   Lisinopril Other (See Comments)    Makes patient have jerking motions.   Neurontin [Gabapentin] Other (See Comments)    Makes patient have jerking motions.   Zoloft [Sertraline Hcl] Diarrhea and Other (See Comments)    Makes patient have jerking motions.   Valium [Diazepam] Other (See Comments)    "Did not work"   Flomax [Tamsulosin Hcl] Other (See Comments)    Dizziness   Glimepiride Other (See Comments)    "Did not work"    Melatonin Nausea Only   Tape Other (See Comments)    Skin irritation    Family History  Problem Relation Age of Onset   Colon cancer Sister    Hypertension Other    Depression Brother    Depression Daughter     Prior to Admission medications   Medication Sig Start Date End Date Taking? Authorizing Provider  alfuzosin (UROXATRAL) 10 MG 24 hr tablet Take 1 tablet (10 mg total) by mouth daily with breakfast. 12/08/22  Yes McKenzie, Candee Furbish, MD  Apremilast (OTEZLA) 30 MG TABS Take 30 mg by  mouth 2 (two) times daily.   Yes [provider]  atorvastatin (LIPITOR) 40 MG tablet Take 1 tablet (40 mg total) by mouth daily. 07/09/22  Yes Gerlene Fee, NP  Calcium 200 MG TABS Take 400 mg by mouth in the morning and at bedtime.   Yes [provider]  Cholecalciferol (VITAMIN D3) 250 MCG (10000 UT) TABS Take 1,000 Units by mouth daily.   Yes [provider]  dextromethorphan-guaiFENesin (MUCINEX DM) 30-600 MG 12hr tablet Take 1 tablet by mouth daily as needed for cough.   Yes [provider]  Diphenhyd-Hydrocort-Nystatin (FIRST-DUKES MOUTHWASH MT) Use as directed 5 mLs in the mouth or throat 4 (four) times daily.   Yes [provider]  enzalutamide (XTANDI) 80 MG tablet Take 80 mg by mouth daily.   Yes [provider]  ferrous sulfate 325 (65 FE) MG tablet Take 1 tablet (325 mg total) by mouth 2 (two) times daily with a meal. Mon, wed, fri Patient taking differently: Take 325 mg by mouth 2 (two) times daily. 08/15/22  Yes Johnson, Clanford L, MD  finasteride (PROSCAR) 5 MG tablet Take 1 tablet (5 mg total) by mouth daily. 12/08/22  Yes McKenzie, Candee Furbish, MD  furosemide (LASIX) 20 MG tablet Take 1 tablet (20 mg total) by mouth every other day. Tuesday, Thursday and Saturday 08/15/22 12/31/22 Yes Johnson, Clanford L, MD  magic mouthwash (nystatin, lidocaine, diphenhydrAMINE, alum & mag hydroxide) suspension Swish and spit 5 mLs 4 (four) times daily  as needed for mouth pain. 11/28/22  Yes Pokhrel, Laxman, MD  pantoprazole (PROTONIX) 40 MG tablet Take 1 tablet (40 mg total) by mouth 2 (two) times daily. Patient taking differently: Take 40 mg by mouth daily. 08/15/22 08/15/23 Yes Johnson, Clanford L, MD  pregabalin (LYRICA) 75 MG capsule Take 1 capsule (75 mg total) by mouth 2 (two) times daily. 07/09/22  Yes Gerlene Fee, NP  topiramate (TOPAMAX) 50 MG tablet Take 0.5 tablets (25 mg total) by mouth 2 (two) times daily. Patient taking differently: Take 50 mg by mouth 2 (two) times daily. 07/09/22  Yes Gerlene Fee, NP  vitamin B-12 (CYANOCOBALAMIN) 500 MCG tablet Take 500 mcg by mouth daily.   Yes [provider]  zolpidem (AMBIEN) 5 MG tablet Take 5-10 mg by mouth at bedtime as needed for sleep. 07/09/22  Yes [provider]  cherry syrup syrup Take by mouth. 11/28/22   [provider]  mirtazapine (REMERON) 15 MG tablet Take 1 tablet (15 mg total) by mouth at bedtime. Patient not taking: Reported on 12/31/2022 11/20/22   Gabriel Rung, NP    Physical Exam: Vitals:   12/31/22 1930 12/31/22 2000 12/31/22 2100 12/31/22 2130  BP: (!) 126/57 (!) 117/59 (!) 114/55 117/72  Pulse: 71 60 65 60  Resp: '17 18 20 17  '$ Temp:    97.6 F (36.4 C)  TempSrc:    Oral  SpO2: (!) 88% 100% 100% 100%  Weight:      Height:       1.  General: Patient lying supine in bed,  no acute distress   2. Psychiatric: Alert and oriented to self, mood and behavior normal for situation, pleasant and cooperative with exam   3. Neurologic: Speech and language are normal, face is symmetric, moves upper extremities voluntarily to hold blanket close to chest, not currently following commands, at what is suspected to be baseline without acute deficits on limited exam   4. HEENMT:  Head  is atraumatic, normocephalic, pupils reactive to light, neck is supple, trachea is midline, mucous membranes are moist, conjunctivae are clear, but eye  lashes are coated with mucus   5. Respiratory : In the lower lung fields bilaterally, upper airway noise with wet sounding cough, no wheeze, no cyanosis, maintaining oxygen sats on 2 L nasal cannula   6. Cardiovascular : Heart rate normal, rhythm is regular, murmur present, rubs or gallops, no peripheral edema, peripheral pulses palpated   7. Gastrointestinal:  Abdomen is soft, nondistended, nontender to palpation bowel sounds active, no masses or organomegaly palpated   8. Skin:  Skin is warm, dry and intact without rashes, acute lesions, or ulcers on limited exam   9.Musculoskeletal:  No acute deformities or trauma, no asymmetry in tone, no peripheral edema, peripheral pulses palpated, no tenderness to palpation in the extremities  Data Reviewed: In the ED Temp 97.6, heart rate 67-71, respiratory rate 16-18, blood pressure 113/50-131/60, satting 88-100% on 2-3 L nasal cannula No leukocytosis with white blood cell count of 8.6, hemoglobin 9.5, platelets 255 Chemistry is unremarkable Albumin is low at 2.7 Positive for RSV CT chest shows possible left pleural effusion, possible left lower lobe pneumonia, possible aspiration pneumonia in the right lower lobe Chest x-ray showed basilar opacity increased from prior exam Patient was started on Rocephin, Zithromax, Flagyl Admission was requested for hypoxia on room air  Assessment and Plan: * Acute respiratory failure with hypoxia ([place]) - Patient has used oxygen in the past but that was discontinued until he developed infectious symptoms last week - Currently requiring 2 L nasal cannula - Likely secondary to RSV with possible close aspiration pneumonia - Procalcitonin pending - See plan for pneumonia - Continue oxygen supplementation weaning off as tolerated - Albuterol for shortness of breath or wheezing - Speech swallow eval to assess for aspiration - Continue to monitor  Pneumonia - RSV positive - CT chest shows left lower  lobe pneumonia, and possible aspiration in the right lower lobe - Chest x-ray shows basilar opacity that is increased from prior exam - Patient was started on Rocephin, Zithromax, Flagyl - Given the suspicion for aspiration we will continue on Unasyn - Expectorated sputum culture - Strep and Legionella urine antigens - Blood culture pending, unfortunately already started on antibiotics - Procalcitonin pending - Continue to monitor  Generalized weakness - Reported generalized weakness, although chart does report a history of functional quadriplegia - Likely related to acute illness - Continue to treat as per pneumonia - Continue to monitor  GERD (gastroesophageal reflux disease) - Continue Protonix  Chronic diastolic CHF (congestive heart failure) ([name] - Holding diuretic in setting of acute illness - Monitor for signs of fluid overload - Continue to monitor  Lower urinary tract symptoms (LUTS) - Continue alfuzosin and finasteride - Monitor urine output - Urine pending   Hypercholesterolemia - Continue statin  Iron deficiency anemia due to chronic blood loss - Continue iron and B12 - Hemoglobin currently stable at 9.5 - No overt signs of bleeding - Trend with a.m. labs      Advance Care Planning:   Code Status: Full Code  Consults: None at this time  Family Communication: No family at bedside  Severity of Illness: The appropriate patient status for this patient is OBSERVATION. Observation status is judged to be reasonable and necessary in order to provide the required intensity of service to ensure the patient's safety. The patient's presenting symptoms, physical exam findings, and initial radiographic and laboratory data in  the context of their medical condition is felt to place them at decreased risk for further clinical deterioration. Furthermore, it is anticipated that the patient will be medically stable for discharge from the hospital within 2 midnights of  admission.   Author: Rolla Plate, DO 12/31/2022 9:54 PM  For on call review [email or web address].

## 2022-12-31 NOTE — ED Notes (Signed)
Pt trial on room air per MD. O2 sats decreased to 88-90%. Provider notified and pt placed back on 2L [place]

## 2022-12-31 NOTE — Assessment & Plan Note (Signed)
-  Reported generalized weakness, although chart does report a history of functional quadriplegia - Likely related to acute illness - Continue to treat as per pneumonia - Continue to monitor

## 2022-12-31 NOTE — Progress Notes (Signed)
PHARMACY NOTE:  ANTIMICROBIAL RENAL DOSAGE ADJUSTMENT  Current antimicrobial regimen includes a mismatch between antimicrobial dosage and estimated renal function.  As per policy approved by the Pharmacy & Therapeutics and Medical Executive Committees, the antimicrobial dosage will be adjusted accordingly.  Current antimicrobial dosage:  Unasyn 3gm IV q12h  Indication: Aspiration pneumonia  Renal Function:  Estimated Creatinine Clearance: 62.2 mL/min (by C-G formula based on SCr of 0.74 mg/dL).   Antimicrobial dosage has been changed to:  Unasyn 3gm IV q6h   Thank you for allowing pharmacy to be a part of this patient's care.  Everette Rank, [facility] 12/31/2022 10:42 PM

## 2022-12-31 NOTE — ED Triage Notes (Signed)
Pt arrived [employer] with c/o cough, congestion and weakness x 1 week. Pt is nonambulatory and on 2 lpm nasal cannula continuously. 16 f 13m foley cath in place.

## 2022-12-31 NOTE — ED Notes (Signed)
Daughter [name] called and given update on pt status and plan of care. Pt informed that pt has been admitted and provider room number and visiting hours information

## 2022-12-31 NOTE — ED Notes (Signed)
Drew labs including light green, dark green, blue, lavender, 2 gold, lactic, and 1 set of cultures. Sent to lab

## 2022-12-31 NOTE — Assessment & Plan Note (Signed)
Continue statin. 

## 2022-12-31 NOTE — Assessment & Plan Note (Signed)
-  Continue Protonix °

## 2023-01-01 ENCOUNTER — Encounter (HOSPITAL_COMMUNITY): Payer: Self-pay | Admitting: Family Medicine

## 2023-01-01 ENCOUNTER — Other Ambulatory Visit: Payer: Self-pay

## 2023-01-01 DIAGNOSIS — C61 Malignant neoplasm of prostate: Secondary | ICD-10-CM | POA: Diagnosis present

## 2023-01-01 DIAGNOSIS — Z8249 Family history of ischemic heart disease and other diseases of the circulatory system: Secondary | ICD-10-CM | POA: Diagnosis not present

## 2023-01-01 DIAGNOSIS — I69398 Other sequelae of cerebral infarction: Secondary | ICD-10-CM | POA: Diagnosis not present

## 2023-01-01 DIAGNOSIS — D500 Iron deficiency anemia secondary to blood loss (chronic): Secondary | ICD-10-CM | POA: Diagnosis present

## 2023-01-01 DIAGNOSIS — E1142 Type 2 diabetes mellitus with diabetic polyneuropathy: Secondary | ICD-10-CM | POA: Diagnosis present

## 2023-01-01 DIAGNOSIS — B338 Other specified viral diseases: Secondary | ICD-10-CM | POA: Diagnosis present

## 2023-01-01 DIAGNOSIS — R531 Weakness: Secondary | ICD-10-CM | POA: Diagnosis not present

## 2023-01-01 DIAGNOSIS — J121 Respiratory syncytial virus pneumonia: Secondary | ICD-10-CM | POA: Diagnosis present

## 2023-01-01 DIAGNOSIS — E7800 Pure hypercholesterolemia, unspecified: Secondary | ICD-10-CM | POA: Diagnosis present

## 2023-01-01 DIAGNOSIS — L89222 Pressure ulcer of left hip, stage 2: Secondary | ICD-10-CM | POA: Diagnosis present

## 2023-01-01 DIAGNOSIS — Z1152 Encounter for screening for COVID-19: Secondary | ICD-10-CM | POA: Diagnosis not present

## 2023-01-01 DIAGNOSIS — E876 Hypokalemia: Secondary | ICD-10-CM | POA: Diagnosis present

## 2023-01-01 DIAGNOSIS — B952 Enterococcus as the cause of diseases classified elsewhere: Secondary | ICD-10-CM | POA: Diagnosis present

## 2023-01-01 DIAGNOSIS — R532 Functional quadriplegia: Secondary | ICD-10-CM | POA: Diagnosis present

## 2023-01-01 DIAGNOSIS — L89152 Pressure ulcer of sacral region, stage 2: Secondary | ICD-10-CM | POA: Diagnosis present

## 2023-01-01 DIAGNOSIS — J9601 Acute respiratory failure with hypoxia: Secondary | ICD-10-CM | POA: Diagnosis present

## 2023-01-01 DIAGNOSIS — Z87891 Personal history of nicotine dependence: Secondary | ICD-10-CM | POA: Diagnosis not present

## 2023-01-01 DIAGNOSIS — E1122 Type 2 diabetes mellitus with diabetic chronic kidney disease: Secondary | ICD-10-CM | POA: Diagnosis present

## 2023-01-01 DIAGNOSIS — Z91041 Radiographic dye allergy status: Secondary | ICD-10-CM | POA: Diagnosis not present

## 2023-01-01 DIAGNOSIS — I5032 Chronic diastolic (congestive) heart failure: Secondary | ICD-10-CM | POA: Diagnosis present

## 2023-01-01 DIAGNOSIS — I110 Hypertensive heart disease with heart failure: Secondary | ICD-10-CM | POA: Diagnosis present

## 2023-01-01 DIAGNOSIS — J690 Pneumonitis due to inhalation of food and vomit: Secondary | ICD-10-CM | POA: Diagnosis present

## 2023-01-01 DIAGNOSIS — F0150 Vascular dementia without behavioral disturbance: Secondary | ICD-10-CM | POA: Diagnosis present

## 2023-01-01 DIAGNOSIS — M353 Polymyalgia rheumatica: Secondary | ICD-10-CM | POA: Diagnosis present

## 2023-01-01 DIAGNOSIS — K219 Gastro-esophageal reflux disease without esophagitis: Secondary | ICD-10-CM | POA: Diagnosis present

## 2023-01-01 DIAGNOSIS — R399 Unspecified symptoms and signs involving the genitourinary system: Secondary | ICD-10-CM | POA: Diagnosis not present

## 2023-01-01 DIAGNOSIS — N390 Urinary tract infection, site not specified: Secondary | ICD-10-CM | POA: Diagnosis present

## 2023-01-01 LAB — CBC WITH DIFFERENTIAL/PLATELET
Abs Immature Granulocytes: 0.09 10*3/uL — ABNORMAL HIGH (ref 0.00–0.07)
Basophils Absolute: 0 10*3/uL (ref 0.0–0.1)
Basophils Relative: 0 %
Eosinophils Absolute: 0.2 10*3/uL (ref 0.0–0.5)
Eosinophils Relative: 3 %
HCT: 31.2 % — ABNORMAL LOW (ref 39.0–52.0)
Hemoglobin: 9.1 g/dL — ABNORMAL LOW (ref 13.0–17.0)
Immature Granulocytes: 1 %
Lymphocytes Relative: 10 %
Lymphs Abs: 0.7 10*3/uL (ref 0.7–4.0)
MCH: 27.1 pg (ref 26.0–34.0)
MCHC: 29.2 g/dL — ABNORMAL LOW (ref 30.0–36.0)
MCV: 92.9 fL (ref 80.0–100.0)
Monocytes Absolute: 0.7 10*3/uL (ref 0.1–1.0)
Monocytes Relative: 10 %
Neutro Abs: 4.8 10*3/uL (ref 1.7–7.7)
Neutrophils Relative %: 76 %
Platelets: 234 10*3/uL (ref 150–400)
RBC: 3.36 MIL/uL — ABNORMAL LOW (ref 4.22–5.81)
RDW: 17.2 % — ABNORMAL HIGH (ref 11.5–15.5)
WBC: 6.4 10*3/uL (ref 4.0–10.5)
nRBC: 0 % (ref 0.0–0.2)

## 2023-01-01 LAB — COMPREHENSIVE METABOLIC PANEL
ALT: 6 U/L (ref 0–44)
AST: 10 U/L — ABNORMAL LOW (ref 15–41)
Albumin: 2.3 g/dL — ABNORMAL LOW (ref 3.5–5.0)
Alkaline Phosphatase: 45 U/L (ref 38–126)
Anion gap: 7 (ref 5–15)
BUN: 15 mg/dL (ref 8–23)
CO2: 26 mmol/L (ref 22–32)
Calcium: 8.7 mg/dL — ABNORMAL LOW (ref 8.9–10.3)
Chloride: 101 mmol/L (ref 98–111)
Creatinine, Ser: 0.69 mg/dL (ref 0.61–1.24)
GFR, Estimated: >60 mL/min (ref >60)
Glucose, Bld: 87 mg/dL (ref 70–99)
Potassium: 3.2 mmol/L — ABNORMAL LOW (ref 3.5–5.1)
Sodium: 134 mmol/L — ABNORMAL LOW (ref 135–145)
Total Bilirubin: 0.4 mg/dL (ref 0.3–1.2)
Total Protein: 5.7 g/dL — ABNORMAL LOW (ref 6.5–8.1)

## 2023-01-01 LAB — MAGNESIUM: Magnesium: 1.9 mg/dL (ref 1.7–2.4)

## 2023-01-01 LAB — STREP PNEUMONIAE URINARY ANTIGEN: Strep Pneumo Urinary Antigen: NEGATIVE

## 2023-01-01 MED ORDER — IPRATROPIUM-ALBUTEROL 0.5-2.5 (3) MG/3ML IN SOLN: 3.0000 mL | Freq: Four times a day (QID) | RESPIRATORY_TRACT | Status: DC | PRN

## 2023-01-01 MED ORDER — ENOXAPARIN SODIUM 40 MG/0.4ML IJ SOSY
40.0000 mg | PREFILLED_SYRINGE | INTRAMUSCULAR | Status: DC
  Administered 2023-01-02 – 2023-01-03 (×2): 40 mg via SUBCUTANEOUS
  Filled 2023-01-01 (×2): qty 0.4

## 2023-01-01 MED ORDER — ORAL CARE MOUTH RINSE: 15.0000 mL | OROMUCOSAL | Status: DC | PRN

## 2023-01-01 MED ORDER — SODIUM CHLORIDE 0.9 % IV SOLN
500.0000 mg | INTRAVENOUS | Status: DC
  Administered 2023-01-01 – 2023-01-02 (×2): 500 mg via INTRAVENOUS
  Filled 2023-01-01 (×2): qty 5

## 2023-01-01 MED ORDER — POTASSIUM CHLORIDE 10 MEQ/100ML IV SOLN
10.0000 meq | INTRAVENOUS | Status: AC
  Administered 2023-01-01 (×4): 10 meq via INTRAVENOUS
  Filled 2023-01-01 (×4): qty 100

## 2023-01-01 NOTE — Progress Notes (Signed)
Per notes to urology, chronic catheter was changed by home nurse on 12/25/22.

## 2023-01-01 NOTE — TOC Progression Note (Signed)
  Transition of Care [facility]) Screening Note   Patient Details  Name: TEKOA AMON Date of Birth: [birth year]   [facility]) CM/SW Contact:    [name], LCSW Phone Number: 01/01/2023, 10:53 AM    Transition of Care Department [facility]) has reviewed patient and no TOC needs have been identified at this time. We will continue to monitor patient advancement through interdisciplinary progression rounds. If new patient transition needs arise, please place a TOC consult.

## 2023-01-01 NOTE — Evaluation (Addendum)
Clinical/Bedside Swallow Evaluation Patient Details  Name: Jared Bridges MRN: [id number] Date of Birth: 01-24-1930  Today's Date: 01/01/2023 Time: SLP Start Time (ACUTE ONLY): 64 SLP Stop Time (ACUTE ONLY): 4196 SLP Time Calculation (min) (ACUTE ONLY): 20 min  Past Medical History:  Past Medical History:  Diagnosis Date   Acute cholecystitis 04/05/2016   Anemia, iron deficiency    Chronic headaches    Migraines   Chronic kidney disease    Resolved   Depression    Diverticulitis    Essential hypertension    GI bleed    Dr. Laural Golden - 1998   Hypercholesteremia    Orthostatic hypotension    Peptic ulcer disease    Peripheral neuropathy    Polymyalgia rheumatica ([place])    Prostate cancer ([place])    Rectal bleeding 11/26/2017   Added automatically from request for surgery [id number]   Stroke [facility])    Residual trouble reading and writing   Syncope    Thyroid nodule    TIA (transient ischemic attack) 12/2017   Type 2 diabetes mellitus ([place])    Upper GI bleed 06/18/2012   Past Surgical History:  Past Surgical History:  Procedure Laterality Date   APPENDECTOMY     as child   BACK SURGERY     2000 IDET SPINAL PROC   CHOLECYSTECTOMY N/A 04/07/2016   Procedure: CHOLECYSTECTOMY;  Surgeon: Aviva Signs, MD;  Location: [facility];  Service: General;  Laterality: N/A;   [place]   COLONOSCOPY N/A 01/07/2018   Procedure: COLONOSCOPY;  Surgeon: Rogene Houston, MD;  Location: [facility];  Service: Endoscopy;  Laterality: N/A;  1:00   COLONOSCOPY WITH PROPOFOL N/A 08/12/2022   Procedure: COLONOSCOPY WITH PROPOFOL;  Surgeon: Harvel Quale, MD;  [facility]: [facility];  Service: Gastroenterology;  Laterality: N/A;  ASA 3   CRYOTHERAPY     ESOPHAGOGASTRODUODENOSCOPY (EGD) WITH PROPOFOL N/A 08/09/2022   Procedure: ESOPHAGOGASTRODUODENOSCOPY (EGD) WITH PROPOFOL;  Surgeon: Harvel Quale, MD;  [facility];  Service:  Gastroenterology;  Laterality: N/A;   EYE SURGERY     right cataract with lens implant   FEMUR SURGERY     RT [place] STUDY N/A 08/13/2022   Procedure: GIVENS CAPSULE STUDY;  Surgeon: Harvel Quale, MD;  [facility];  Service: Gastroenterology;  Laterality: N/A;   HAND SURGERY     RIGHT   HERNIA REPAIR     1-right inguinal, 3- left inguinal   IRRIGATION AND DEBRIDEMENT KNEE Left 07/09/2015   Procedure: LEFT KNEE IRRIGATION AND DEBRIDEMENT WOUND CLOSURE;  Surgeon: Gaynelle Arabian, MD;  Location: [facility];  Service: Orthopedics;  Laterality: Left;   JOINT REPLACEMENT  02/28/2016   lt knee revision   KNEE DEBRIDEMENT     2016   LEFT   MENISCUS REPAIR     LEFT   POLYPECTOMY  01/07/2018   Procedure: POLYPECTOMY;  Surgeon: Rogene Houston, MD;  [facility];  Service: Endoscopy;;  colon    POLYPECTOMY  08/12/2022   Procedure: POLYPECTOMY;  Surgeon: Harvel Quale, MD;  [facility];  Service: Gastroenterology;;   SHOULDER OPEN ROTATOR CUFF REPAIR     RT SHOULDER   SPINAL FUSION     SPINAL FUSION     TONSILLECTOMY     TOTAL KNEE ARTHROPLASTY Left 05/28/2015   Procedure: LEFT  TOTAL KNEE ARTHROPLASTY;  Surgeon: Gaynelle Arabian, MD;  Location: [facility];  Service: Orthopedics;  Laterality: Left;   TOTAL KNEE REVISION Left 02/28/2016   Procedure: LEFT TOTAL KNEE REVISION;  Surgeon: Leandrew Koyanagi, MD;  Location: [name];  Service: Orthopedics;  Laterality: Left;   TRIGGER FINGER RELEASE     RT HAND   1990S   UVULECTOMY  2004   TURBINATE,TONSIL,ADENOID   HPI:  LEMOND GRIFFEE is a 89 y.o. male with medical history significant of iron deficiency anemia, CKD, essential hypertension, hyperlipidemia, vascular dementia, polymyalgia rheumatica, type 2 diabetes diet-controlled, and more presents the ED with a chief complaint of cough and generalized weakness.  Patient is oriented to self but not to place or year so is not able to  provide any history.  He does note that he has been coughing, and reports that that cough is productive of yellow sputum.  When asked any other questions, he continues to report that the sputum is yellow.  Family had told the ER provider that patient has been on room air at home and maintaining his oxygen saturations until he developed these infectious symptoms about a week ago.  In the ED he was maintaining oxygen sats 87% on room air, but corrected on 2 L nasal cannula.  Patient is nonambulatory at home, lives with daughter.  Further history could not be obtained at this time. BSE ordered. Pt known to SLP service from previous admissions and MBS   MBSS recently completed 06/20/2022 <<Pt presents with mild pharyngeal dysphagia characterized by penetration of thin liquids with a straw; one episode (with pill administration) penetrates fell slightly below the cords but with cued cough were expelled from the airway. Pt presents with diminished pharyngeal stripping wave and valleculae and pyriform residue across consistencies, however note increased residue with NTL. Pt with decreased laryngeal vestibule closure when drinking from a straw. Note baseline congested cough a couple of times throughout study, however, no penetrates/aspirates present at the time. Recommend upgrade Pt's diet to mechanical soft/D3 and thin liquids with strategies re: NO STRAWS, ensure Pt is sitting upright and alert for all PO, occasional throat clears/cough and repeat dry swallows throughout meal. Recommend crush meds in puree or administer whole in puree. ST will continue to follow acutely and will benefit from ST dysphagia therapy f/u at discharge location. >>     Assessment / Plan / Recommendation  Clinical Impression  Clinical swallow evaluation completed in room. Pt known to SLP service from previous admissions. Pt has been consuming regular/mech soft textures and thin liquids at home. Pt was reclined in bed and daughter was  feeding Pt upon SLP arrival. SLP elevated HOB and provided education regarding importance of upright positioning during PO intake to reduce risks for aspiration. Daughter also indicates that Pt has been using a straw at home and was previously advised to avoid use of straw. Imaging from previous MBS reviewed. [place] for Pt to use straw if taking small sips and needs to take medications in puree- ok to take whole in puree, but should not take pills with thin water due to aspiration risk as documented during last MBSS. COntinue all previous recommendations, Pt appears at baseline. Recommend reg/mec soft textures at home and thin liquids with aspiration and reflux precautions. No further SLP f/u indicated at this time. SLP Visit Diagnosis: Dysphagia, unspecified (R13.10)    Aspiration Risk  Mild aspiration risk    Diet Recommendation Regular;Thin liquid   Liquid Administration via: Cup  Medication Administration: Whole meds with puree Supervision: Staff to assist with self feeding;Full supervision/cueing for compensatory strategies Compensations: Slow rate;Small sips/bites Postural Changes: Seated upright at 90 degrees;Remain upright for at least 30 minutes after po intake    Other  Recommendations Oral Care Recommendations: Oral care BID;Staff/trained caregiver to provide oral care Other Recommendations: Clarify dietary restrictions    Recommendations for follow up therapy are one component of a multi-disciplinary discharge planning process, led by the attending physician.  Recommendations may be updated based on patient status, additional functional criteria and insurance authorization.  Follow up Recommendations No SLP follow up      Assistance Recommended at Discharge    Functional Status Assessment Patient has not had a recent decline in their functional status  Frequency and Duration            Prognosis Prognosis for Safe Diet Advancement: [place] Study   General Date of  Onset: 12/31/22 HPI: Jared Bridges is a 89 y.o. male with medical history significant of iron deficiency anemia, CKD, essential hypertension, hyperlipidemia, vascular dementia, polymyalgia rheumatica, type 2 diabetes diet-controlled, and more presents the ED with a chief complaint of cough and generalized weakness.  Patient is oriented to self but not to place or year so is not able to provide any history.  He does note that he has been coughing, and reports that that cough is productive of yellow sputum.  When asked any other questions, he continues to report that the sputum is yellow.  Family had told the ER provider that patient has been on room air at home and maintaining his oxygen saturations until he developed these infectious symptoms about a week ago.  In the ED he was maintaining oxygen sats 87% on room air, but corrected on 2 L nasal cannula.  Patient is nonambulatory at home, lives with daughter.  Further history could not be obtained at this time. BSE ordered. Pt known to SLP service from previous admissions and MBS Type of Study: Bedside Swallow Evaluation Previous Swallow Assessment: MBSS July 2023 Diet Prior to this Study: Regular;Thin liquids Temperature Spikes Noted: No Respiratory Status: Nasal cannula History of Recent Intubation: No Behavior/Cognition: Alert;Cooperative;Pleasant mood Oral Cavity Assessment: Other (comment) (black along tongue) Oral Care Completed by SLP: Recent completion by staff Oral Cavity - Dentition: Adequate natural dentition;Missing dentition Vision: Functional for self-feeding Self-Feeding Abilities: Needs assist Patient Positioning: Upright in bed Baseline Vocal Quality: Normal Volitional Cough: Strong Volitional Swallow: Able to elicit    Oral/Motor/Sensory Function Overall Oral Motor/Sensory Function: Within functional limits   Ice Chips Ice chips: Within functional limits Presentation: Spoon   Thin Liquid Thin Liquid: Within functional  limits Presentation: Cup;Straw    Nectar Thick Nectar Thick Liquid: Not tested   Honey Thick Honey Thick Liquid: Not tested   Puree Puree: Within functional limits Presentation: Spoon   Solid     Solid: Within functional limits Presentation: Spoon     Thank you,  Genene Churn, [place]  Shivam Mestas 01/01/2023,1:41 PM

## 2023-01-01 NOTE — Progress Notes (Signed)
PROGRESS NOTE  ARHUM PEEPLES UVO:[id number] DOB: [birth year] DOA: 12/31/2022 PCP: Juluis Pitch, MD  HPI/Recap of past 24 hours: Jared Bridges is a 89 y.o. male with medical history significant of iron deficiency anemia, CKD, essential hypertension, hyperlipidemia, vascular dementia, polymyalgia rheumatica, type 2 diabetes diet-controlled, presents to the ED due to cough and generalized weakness. Patient is oriented to self but not to place or year so is not able to provide any history.  He does note that he has been coughing, and reports that that cough is productive of yellow sputum. Family had told the ER provider that patient has been on room air at home and maintaining his oxygen saturations until he developed these infectious symptoms about a week ago.  In the ED he was maintaining oxygen sats 87% on room air, but corrected on 2 L nasal cannula. Patient is nonambulatory at home, lives with daughter.    Today, patient still requiring oxygen, appears weak generally.  Not in any significant distress.     Assessment/Plan: Principal Problem:   Acute respiratory failure with hypoxia ([name] Active Problems:   Iron deficiency anemia due to chronic blood loss   Hypercholesterolemia   Lower urinary tract symptoms (LUTS)   Chronic diastolic CHF (congestive heart failure) ([name]   GERD (gastroesophageal reflux disease)   Generalized weakness   Pneumonia    Acute respiratory failure with hypoxia RSV infection Aspiration PNA Patient has used oxygen in the past but that was discontinued until he developed infectious symptoms last week RSV positive Strep pneumo negative, Legionella pending CT chest shows left lower lobe pneumonia with possible aspiration in the right lower lobe Required 2 L nasal cannula, plan to wean off Procalcitonin 0.19 BC X 2 pending SLP evaluation Continue Unasyn, added on azithromycin, nebulizer as needed Supplemental O2 as needed Monitor closely  ??UTI UA  suspicious for UTI although indwelling foley  UC pending Antibiotics as above   Hypokalemia Replace prn  Generalized weakness Reported generalized weakness, although chart does report a history of functional quadriplegia Likely related to acute illness   GERD (gastroesophageal reflux disease) Continue Protonix   Chronic diastolic CHF (congestive heart failure) ([name] Holding diuretic in setting of acute illness Monitor for signs of fluid overload   Lower urinary tract symptoms (LUTS) Chronic indwelling Foley catheter Continue alfuzosin and finasteride Continue Foley catheter placed by urology (history of difficult placement, recently changed on 12/25/2022) Monitor urine output   Hypercholesterolemia Continue statin   Iron deficiency anemia due to chronic blood loss Hemoglobin around baseline No signs of bleeding Continue iron and B12 supplement     Pressure Injury 11/27/22 Sacrum Medial Stage 2 -  Partial thickness loss of dermis presenting as a shallow open injury with a red, pink wound bed without slough. 5 x 10 cm area , pink and white (Active)  11/27/22 1600  Location: Sacrum  Location Orientation: Medial  Staging: Stage 2 -  Partial thickness loss of dermis presenting as a shallow open injury with a red, pink wound bed without slough.  Wound Description (Comments): 5 x 10 cm area , pink and white  Present on Admission: Yes  Dressing Type Foam - Lift dressing to assess site every shift 12/31/22 2227     Pressure Injury 11/27/22 Hip Left;Lateral Stage 2 -  Partial thickness loss of dermis presenting as a shallow open injury with a red, pink wound bed without slough. 1 x 1.5 cm , red (Active)  11/27/22 1600  Location: Hip  Location Orientation: Left;Lateral  Staging: Stage 2 -  Partial thickness loss of dermis presenting as a shallow open injury with a red, pink wound bed without slough.  Wound Description (Comments): 1 x 1.5 cm , red  Present on Admission: Yes   Dressing Type Foam - Lift dressing to assess site every shift 12/31/22 2227        Estimated body mass index is 20.85 kg/m as calculated from the following:   Height as of this encounter: '6\' 1"'$  (1.854 m).   Weight as of this encounter: 71.7 kg.     Code Status: Full   Family Communication: None at bedside  Disposition Plan: Status is: Inpatient Remains inpatient appropriate because: level of care      Consultants: None  Procedures: None  Antimicrobials: Unasyn Azithromycin  DVT prophylaxis:  Lovenox   Objective: Vitals:   12/31/22 2130 12/31/22 2227 01/01/23 0201 01/01/23 0338  BP: 117/72 (!) 104/49 110/65 (!) 117/45  Pulse: 60 64 77 60  Resp: 17 (!) '22 18 16  '$ Temp: 97.6 F (36.4 C) 97.6 F (36.4 C) 98 F (36.7 C) 97.6 F (36.4 C)  TempSrc: Oral Oral    SpO2: 100% 97% 98% 92%  Weight:  71.7 kg    Height:  '6\' 1"'$  (1.854 m)      Intake/Output Summary (Last 24 hours) at 01/01/2023 1337 Last data filed at 01/01/2023 0900 Gross per 24 hour  Intake 700 ml  Output 400 ml  Net 300 ml   Filed Weights   12/31/22 1545 12/31/22 2227  Weight: 77 kg 71.7 kg    Exam: General: NAD, generalized weakness, deconditioned   Cardiovascular: S1, S2 present Respiratory: Poor respiratory effort Abdomen: Soft, nontender, nondistended, bowel sounds present Musculoskeletal: No bilateral pedal edema noted Skin: Normal Psychiatry: Unable to assess    Data Reviewed: CBC: Recent Labs  Lab 12/31/22 1605 01/01/23 0433  WBC 8.6 6.4  NEUTROABS 6.9 4.8  HGB 9.5* 9.1*  HCT 31.2* 31.2*  MCV 92.3 92.9  PLT 255 259   Basic Metabolic Panel: Recent Labs  Lab 12/31/22 1605 01/01/23 0433  NA 137 134*  K 3.7 3.2*  CL 100 101  CO2 26 26  GLUCOSE 143* 87  BUN 16 15  CREATININE 0.74 0.69  CALCIUM 9.1 8.7*  MG  --  1.9   GFR: Estimated Creatinine Clearance: 62.2 mL/min (by C-G formula based on SCr of 0.69 mg/dL). Liver Function Tests: Recent Labs  Lab  12/31/22 1605 01/01/23 0433  AST 15 10*  ALT 7 6  ALKPHOS 57 45  BILITOT 0.7 0.4  PROT 6.8 5.7*  ALBUMIN 2.7* 2.3*   No results for input(s): "LIPASE", "AMYLASE" in the last 168 hours. No results for input(s): "AMMONIA" in the last 168 hours. Coagulation Profile: No results for input(s): "INR", "PROTIME" in the last 168 hours. Cardiac Enzymes: No results for input(s): "CKTOTAL", "CKMB", "CKMBINDEX", "TROPONINI" in the last 168 hours. BNP (last 3 results) No results for input(s): "PROBNP" in the last 8760 hours. HbA1C: No results for input(s): "HGBA1C" in the last 72 hours. CBG: No results for input(s): "GLUCAP" in the last 168 hours. Lipid Profile: No results for input(s): "CHOL", "HDL", "LDLCALC", "TRIG", "CHOLHDL", "LDLDIRECT" in the last 72 hours. Thyroid Function Tests: No results for input(s): "TSH", "T4TOTAL", "FREET4", "T3FREE", "THYROIDAB" in the last 72 hours. Anemia Panel: No results for input(s): "VITAMINB12", "FOLATE", "FERRITIN", "TIBC", "IRON", "RETICCTPCT" in the last 72 hours. Urine analysis:    Component Value Date/Time  COLORURINE AMBER (A) 12/31/2022 2250   APPEARANCEUR CLOUDY (A) 12/31/2022 2250   LABSPEC 1.026 12/31/2022 2250   PHURINE 5.0 12/31/2022 2250   GLUCOSEU NEGATIVE 12/31/2022 2250   HGBUR MODERATE (A) 12/31/2022 2250   BILIRUBINUR NEGATIVE 12/31/2022 2250   BILIRUBINUR negative 04/19/2019 1354   KETONESUR NEGATIVE 12/31/2022 2250   PROTEINUR 100 (A) 12/31/2022 2250   UROBILINOGEN 0.2 04/19/2019 1354   UROBILINOGEN 1.0 05/21/2015 1050   NITRITE NEGATIVE 12/31/2022 2250   LEUKOCYTESUR MODERATE (A) 12/31/2022 2250   Sepsis Labs: '@LABRCNTIP'$ (procalcitonin:4,lacticidven:4)  ) Recent Results (from the past 240 hour(s))  Resp panel by RT-PCR (RSV, Flu A&B, Covid) Anterior Nasal Swab     Status: Abnormal   Collection Time: 12/31/22  3:40 PM   Specimen: Anterior Nasal Swab  Result Value Ref Range Status   SARS Coronavirus 2 by RT PCR  NEGATIVE NEGATIVE Final    Comment: (NOTE) SARS-CoV-2 target nucleic acids are NOT DETECTED.  The SARS-CoV-2 RNA is generally detectable in upper respiratory specimens during the acute phase of infection. The lowest concentration of SARS-CoV-2 viral copies this assay can detect is 138 copies/mL. A negative result does not preclude SARS-Cov-2 infection and should not be used as the sole basis for treatment or other patient management decisions. A negative result may occur with  improper specimen collection/handling, submission of specimen other than nasopharyngeal swab, presence of viral mutation(s) within the areas targeted by this assay, and inadequate number of viral copies(<138 copies/mL). A negative result must be combined with clinical observations, patient history, and epidemiological information. The expected result is Negative.  Fact Sheet for Patients:  EntrepreneurPulse.com.au  Fact Sheet for Healthcare Providers:  IncredibleEmployment.be  This test is no t yet approved or cleared by the [place] FDA and  has been authorized for detection and/or diagnosis of SARS-CoV-2 by FDA under an Emergency Use Authorization (EUA). This EUA will remain  in effect (meaning this test can be used) for the duration of the COVID-19 declaration under Section 564(b)(1) of the Act, 21 U.S.C.section 360bbb-3(b)(1), unless the authorization is terminated  or revoked sooner.       Influenza A by PCR NEGATIVE NEGATIVE Final   Influenza B by PCR NEGATIVE NEGATIVE Final    Comment: (NOTE) The Xpert Xpress SARS-CoV-2/FLU/RSV plus assay is intended as an aid in the diagnosis of influenza from Nasopharyngeal swab specimens and should not be used as a sole basis for treatment. Nasal washings and aspirates are unacceptable for Xpert Xpress SARS-CoV-2/FLU/RSV testing.  Fact Sheet for Patients: EntrepreneurPulse.com.au  Fact Sheet for  Healthcare Providers: IncredibleEmployment.be  This test is not yet approved or cleared by the [place] FDA and has been authorized for detection and/or diagnosis of SARS-CoV-2 by FDA under an Emergency Use Authorization (EUA). This EUA will remain in effect (meaning this test can be used) for the duration of the COVID-19 declaration under Section 564(b)(1) of the Act, 21 U.S.C. section 360bbb-3(b)(1), unless the authorization is terminated or revoked.     Resp Syncytial Virus by PCR POSITIVE (A) NEGATIVE Final    Comment: (NOTE) Fact Sheet for Patients: EntrepreneurPulse.com.au  Fact Sheet for Healthcare Providers: IncredibleEmployment.be  This test is not yet approved or cleared by the [place] FDA and has been authorized for detection and/or diagnosis of SARS-CoV-2 by FDA under an Emergency Use Authorization (EUA). This EUA will remain in effect (meaning this test can be used) for the duration of the COVID-19 declaration under Section 564(b)(1) of the Act, 21 U.S.C. section  360bbb-3(b)(1), unless the authorization is terminated or revoked.  Performed at [facility], [address], [place], [place] [address]   Culture, blood (routine x 2) Call MD if unable to obtain prior to antibiotics being given     Status: None (Preliminary result)   Collection Time: 12/31/22 11:29 PM   Specimen: BLOOD  Result Value Ref Range Status   Specimen Description BLOOD BLOOD LEFT HAND  Final   Special Requests   Final    BOTTLES DRAWN AEROBIC ONLY Blood Culture results may not be optimal due to an inadequate volume of blood received in culture bottles Performed at [facility], [address], [place], [place] [address]    Culture PENDING  Incomplete   Report Status PENDING  Incomplete  Culture, blood (routine x 2) Call MD if unable to obtain prior to antibiotics being given     Status: None (Preliminary result)   Collection Time:  12/31/22 11:33 PM   Specimen: BLOOD  Result Value Ref Range Status   Specimen Description BLOOD BLOOD RIGHT HAND  Final   Special Requests   Final    BOTTLES DRAWN AEROBIC AND ANAEROBIC Blood Culture adequate volume Performed at [facility], [address], [place], [place] [address]    Culture PENDING  Incomplete   Report Status PENDING  Incomplete      Studies: CT Chest Wo Contrast  Result Date: 12/31/2022 CLINICAL DATA:  Cough and congestion EXAM: CT CHEST WITHOUT CONTRAST TECHNIQUE: Multidetector CT imaging of the chest was performed following the standard protocol without IV contrast. RADIATION DOSE REDUCTION: This exam was performed according to the departmental dose-optimization program which includes automated exposure control, adjustment of the mA and/or kV according to patient size and/or use of iterative reconstruction technique. COMPARISON:  Chest x-ray same day. CT chest abdomen and pelvis 04/27/2022. FINDINGS: Cardiovascular: There is aneurysmal dilatation of the ascending aorta measuring 4.2 cm. Distal aortic arch is dilated measuring 4 cm. There are atherosclerotic calcifications of the aorta. Heart is normal in size. There is no pericardial effusion. Mediastinum/Nodes: There are numerous bilateral thyroid nodules measuring up to 8 mm. Thyroid calcifications are present. There has been no significant interval change. There are no enlarged mediastinal, hilar or axillary lymph nodes. Esophagus is within normal limits. Lungs/Pleura: There is a small left pleural effusion. There is patchy atelectasis and airspace disease in the left lower lobe with some tree-in-bud opacities. There some multifocal mild patchy ground-glass opacities in the right lower lobe. There is a new nodular density in the superior segment of the left lower lobe measuring 3 mm image 4/37. There also few new 2 mm pulmonary nodules in the right middle lobe and a few new scattered sub 2 mm nodules in the left upper lobe.  Otherwise, scattered right-sided pulmonary nodules measuring 3 mm or less appear stable. There is no evidence for pneumothorax. There secretions in the distal trachea, left mainstem bronchus and left lower lobe bronchus. Upper Abdomen: No acute abnormality. Gallbladder is surgically absent. Musculoskeletal: Degenerative changes affect the spine. IMPRESSION: 1. Small left pleural effusion. 2. Left lower lobe atelectasis and airspace disease with tree-in-bud opacities worrisome for pneumonia. There secretions in the trachea, left mainstem and left lower lobe bronchus worrisome for aspiration. 3. Mild patchy ground-glass opacities in the right lower lobe may be infectious/inflammatory. 4. Multiple new pulmonary nodules measuring up to 3 mm. Per Fleischner Society Guidelines, if patient is low risk for malignancy, no routine follow-up imaging is recommended. If patient is high  risk for malignancy, a non-contrast Chest CT at 12 months is optional. If performed and the nodule is stable at 12 months, no further follow-up is recommended. These guidelines do not apply to immunocompromised patients and patients with cancer. Follow up in patients with significant comorbidities as clinically warranted. For lung cancer screening, adhere to Lung-RADS guidelines. Reference: Radiology. 2017; 284(1):228-43. 5. Stable aneurysmal dilatation of the ascending thoracic aorta and distal aortic arch measuring up to 4.2 cm. Recommend annual imaging followup by CTA or MRA. This recommendation follows 2010 ACCF/AHA/AATS/ACR/ASA/SCA/SCAI/SIR/STS/SVM Guidelines for the Diagnosis and Management of Patients with Thoracic Aortic Disease. Circulation. 2010; 121: R102-T117. Aortic aneurysm NOS (ICD10-I71.9) 6. Stable thyroid nodules. Aortic Atherosclerosis (ICD10-I70.0). Electronically Signed   By: Ronney Asters M.D.   On: 12/31/2022 18:55   DG Chest 2 View  Result Date: 12/31/2022 CLINICAL DATA:  Cough and congestion. Weakness. EXAM: CHEST - 2  VIEW COMPARISON:  Radiographs 08/10/2022, CT 04/27/2022 FINDINGS: Patient is rotated. The heart is normal in size. Aortic atherosclerosis. There is a small left pleural effusion with associated basilar opacity, slightly increased from prior exam. The right lung is clear. No pulmonary edema. No pneumothorax. IMPRESSION: Small left pleural effusion with associated basilar opacity, slightly increased from prior exam. Electronically Signed   By: Keith Rake M.D.   On: 12/31/2022 17:28    Scheduled Meds:  alfuzosin  10 mg Oral Q breakfast   atorvastatin  40 mg Oral Daily   Chlorhexidine Gluconate Cloth  6 each Topical Daily   ferrous sulfate  325 mg Oral BID   finasteride  5 mg Oral Daily   heparin  5,000 Units Subcutaneous Q8H   pantoprazole  40 mg Oral Daily   pregabalin  75 mg Oral BID   cyanocobalamin  500 mcg Oral Daily    Continuous Infusions:  ampicillin-sulbactam (UNASYN) IV 3 g (01/01/23 0834)   azithromycin       LOS: 0 days     Alma Friendly, MD [facility]  If 7PM-7AM, please contact night-coverage [email or web address] 01/01/2023, 1:37 PM

## 2023-01-02 ENCOUNTER — Telehealth: Payer: Self-pay

## 2023-01-02 DIAGNOSIS — R399 Unspecified symptoms and signs involving the genitourinary system: Secondary | ICD-10-CM | POA: Diagnosis not present

## 2023-01-02 DIAGNOSIS — J121 Respiratory syncytial virus pneumonia: Secondary | ICD-10-CM | POA: Diagnosis not present

## 2023-01-02 DIAGNOSIS — I5032 Chronic diastolic (congestive) heart failure: Secondary | ICD-10-CM | POA: Diagnosis not present

## 2023-01-02 DIAGNOSIS — J9601 Acute respiratory failure with hypoxia: Secondary | ICD-10-CM | POA: Diagnosis not present

## 2023-01-02 LAB — CBC WITH DIFFERENTIAL/PLATELET
Abs Immature Granulocytes: 0.07 10*3/uL (ref 0.00–0.07)
Basophils Absolute: 0 10*3/uL (ref 0.0–0.1)
Basophils Relative: 0 %
Eosinophils Absolute: 0.2 10*3/uL (ref 0.0–0.5)
Eosinophils Relative: 4 %
HCT: 27.7 % — ABNORMAL LOW (ref 39.0–52.0)
Hemoglobin: 8.4 g/dL — ABNORMAL LOW (ref 13.0–17.0)
Immature Granulocytes: 1 %
Lymphocytes Relative: 10 %
Lymphs Abs: 0.6 10*3/uL — ABNORMAL LOW (ref 0.7–4.0)
MCH: 27.9 pg (ref 26.0–34.0)
MCHC: 30.3 g/dL (ref 30.0–36.0)
MCV: 92 fL (ref 80.0–100.0)
Monocytes Absolute: 0.6 10*3/uL (ref 0.1–1.0)
Monocytes Relative: 10 %
Neutro Abs: 4.5 10*3/uL (ref 1.7–7.7)
Neutrophils Relative %: 75 %
Platelets: 229 10*3/uL (ref 150–400)
RBC: 3.01 MIL/uL — ABNORMAL LOW (ref 4.22–5.81)
RDW: 17.3 % — ABNORMAL HIGH (ref 11.5–15.5)
WBC: 6 10*3/uL (ref 4.0–10.5)
nRBC: 0 % (ref 0.0–0.2)

## 2023-01-02 LAB — BASIC METABOLIC PANEL
Anion gap: 9 (ref 5–15)
BUN: 15 mg/dL (ref 8–23)
CO2: 25 mmol/L (ref 22–32)
Calcium: 8.4 mg/dL — ABNORMAL LOW (ref 8.9–10.3)
Chloride: 103 mmol/L (ref 98–111)
Creatinine, Ser: 0.8 mg/dL (ref 0.61–1.24)
GFR, Estimated: >60 mL/min (ref >60)
Glucose, Bld: 124 mg/dL — ABNORMAL HIGH (ref 70–99)
Potassium: 3.5 mmol/L (ref 3.5–5.1)
Sodium: 137 mmol/L (ref 135–145)

## 2023-01-02 LAB — LEGIONELLA PNEUMOPHILA SEROGP 1 UR AG: L. pneumophila Serogp 1 Ur Ag: NEGATIVE

## 2023-01-02 MED ORDER — DM-GUAIFENESIN ER 30-600 MG PO TB12
1.0000 | ORAL_TABLET | Freq: Two times a day (BID) | ORAL | Status: DC
  Administered 2023-01-02 – 2023-01-03 (×2): 1 via ORAL
  Filled 2023-01-02 (×2): qty 1

## 2023-01-02 MED ORDER — IPRATROPIUM-ALBUTEROL 0.5-2.5 (3) MG/3ML IN SOLN
3.0000 mL | Freq: Four times a day (QID) | RESPIRATORY_TRACT | Status: DC
  Administered 2023-01-02 – 2023-01-03 (×4): 3 mL via RESPIRATORY_TRACT
  Filled 2023-01-02 (×4): qty 3

## 2023-01-02 NOTE — Progress Notes (Signed)
PROGRESS NOTE  Jared Bridges WER:[id number] DOB: Dec 06, 1929 DOA: 12/31/2022 PCP: Juluis Pitch, MD  HPI/Recap of past 24 hours: Jared Bridges is a 89 y.o. male with medical history significant of iron deficiency anemia, CKD, essential hypertension, hyperlipidemia, vascular dementia, polymyalgia rheumatica, type 2 diabetes diet-controlled, presents to the ED due to cough and generalized weakness. Patient is oriented to self but not to place or year so is not able to provide any history.  He does note that he has been coughing, and reports that that cough is productive of yellow sputum. Family had told the ER provider that patient has been on room air at home and maintaining his oxygen saturations until he developed these infectious symptoms about a week ago.  In the ED he was maintaining oxygen sats 87% on room air, but corrected on 2 L nasal cannula. Patient is nonambulatory at home, lives with daughter.    Today, pt appears to be more awake. Noted to be congested, finding it difficult to cough up secretion.    Assessment/Plan: Principal Problem:   Acute respiratory failure with hypoxia ([name] Active Problems:   Iron deficiency anemia due to chronic blood loss   Hypercholesterolemia   Lower urinary tract symptoms (LUTS)   Chronic diastolic CHF (congestive heart failure) ([name]   GERD (gastroesophageal reflux disease)   Generalized weakness   Pneumonia    Acute respiratory failure with hypoxia RSV infection Aspiration PNA Patient has used oxygen in the past but that was discontinued until he developed infectious symptoms last week RSV positive Strep pneumo negative, Legionella pending CT chest shows left lower lobe pneumonia with possible aspiration in the right lower lobe Required 2 L nasal cannula, plan to wean off Procalcitonin 0.19 BC X 2 NGTD SLP evaluation Continue Unasyn, added on azithromycin, scheduled nebulizer Supplemental O2 as needed Monitor closely  UTI UA  suspicious for UTI although indwelling foley  UC growing E.faecalis  Antibiotics as above, may need to adjust pending susceptibilities   Hypokalemia Replace prn  Generalized weakness Reported generalized weakness, although chart does report a history of functional quadriplegia Likely related to acute illness   GERD (gastroesophageal reflux disease) Continue Protonix   Chronic diastolic CHF (congestive heart failure) ([name] Holding diuretic in setting of acute illness Monitor for signs of fluid overload   Lower urinary tract symptoms (LUTS) Chronic indwelling Foley catheter Continue alfuzosin and finasteride Continue Foley catheter placed by urology (history of difficult placement, recently changed on 12/25/2022) Monitor urine output   Hypercholesterolemia Continue statin   Iron deficiency anemia due to chronic blood loss Hemoglobin around baseline No signs of bleeding Continue iron and B12 supplement     Pressure Injury 11/27/22 Sacrum Medial Stage 2 -  Partial thickness loss of dermis presenting as a shallow open injury with a red, pink wound bed without slough. 5 x 10 cm area , pink and white (Active)  11/27/22 1600  Location: Sacrum  Location Orientation: Medial  Staging: Stage 2 -  Partial thickness loss of dermis presenting as a shallow open injury with a red, pink wound bed without slough.  Wound Description (Comments): 5 x 10 cm area , pink and white  Present on Admission: Yes  Dressing Type Foam - Lift dressing to assess site every shift 01/02/23 1000     Pressure Injury 11/27/22 Hip Left;Lateral Stage 2 -  Partial thickness loss of dermis presenting as a shallow open injury with a red, pink wound bed without slough. 1 x 1.5 cm ,  red (Active)  11/27/22 1600  Location: Hip  Location Orientation: Left;Lateral  Staging: Stage 2 -  Partial thickness loss of dermis presenting as a shallow open injury with a red, pink wound bed without slough.  Wound Description  (Comments): 1 x 1.5 cm , red  Present on Admission: Yes  Dressing Type Foam - Lift dressing to assess site every shift 01/02/23 1000        Estimated body mass index is 20.85 kg/m as calculated from the following:   Height as of this encounter: '6\' 1"'$  (1.854 m).   Weight as of this encounter: 71.7 kg.     Code Status: Full   Family Communication: None at bedside  Disposition Plan: Status is: Inpatient Remains inpatient appropriate because: level of care      Consultants: None  Procedures: None  Antimicrobials: Unasyn Azithromycin  DVT prophylaxis:  Lovenox   Objective: Vitals:   01/01/23 2106 01/02/23 0611 01/02/23 1000 01/02/23 1445  BP: (!) 123/50 (!) 101/52  (!) 114/57  Pulse: 77 76  76  Resp: (!) 24     Temp: 98.2 F (36.8 C) (!) 97.4 F (36.3 C)  97.9 F (36.6 C)  TempSrc: Oral Oral  Oral  SpO2: 97% 99% 98% 95%  Weight:      Height:        Intake/Output Summary (Last 24 hours) at 01/02/2023 1502 Last data filed at 01/02/2023 0900 Gross per 24 hour  Intake 1090 ml  Output 500 ml  Net 590 ml   Filed Weights   12/31/22 1545 12/31/22 2227  Weight: 77 kg 71.7 kg    Exam: General: NAD, generalized weakness, deconditioned, awake, alert Cardiovascular: S1, S2 present Respiratory: Poor respiratory effort, sounds congested Abdomen: Soft, nontender, nondistended, bowel sounds present Musculoskeletal: No bilateral pedal edema noted Skin: Normal Psychiatry: Unable to assess    Data Reviewed: CBC: Recent Labs  Lab 12/31/22 1605 01/01/23 0433 01/02/23 0501  WBC 8.6 6.4 6.0  NEUTROABS 6.9 4.8 4.5  HGB 9.5* 9.1* 8.4*  HCT 31.2* 31.2* 27.7*  MCV 92.3 92.9 92.0  PLT 255 234 149   Basic Metabolic Panel: Recent Labs  Lab 12/31/22 1605 01/01/23 0433 01/02/23 0501  NA 137 134* 137  K 3.7 3.2* 3.5  CL 100 101 103  CO2 '26 26 25  '$ GLUCOSE 143* 87 124*  BUN '16 15 15  '$ CREATININE 0.74 0.69 0.80  CALCIUM 9.1 8.7* 8.4*  MG  --  1.9  --     GFR: Estimated Creatinine Clearance: 62.2 mL/min (by C-G formula based on SCr of 0.8 mg/dL). Liver Function Tests: Recent Labs  Lab 12/31/22 1605 01/01/23 0433  AST 15 10*  ALT 7 6  ALKPHOS 57 45  BILITOT 0.7 0.4  PROT 6.8 5.7*  ALBUMIN 2.7* 2.3*   No results for input(s): "LIPASE", "AMYLASE" in the last 168 hours. No results for input(s): "AMMONIA" in the last 168 hours. Coagulation Profile: No results for input(s): "INR", "PROTIME" in the last 168 hours. Cardiac Enzymes: No results for input(s): "CKTOTAL", "CKMB", "CKMBINDEX", "TROPONINI" in the last 168 hours. BNP (last 3 results) No results for input(s): "PROBNP" in the last 8760 hours. HbA1C: No results for input(s): "HGBA1C" in the last 72 hours. CBG: No results for input(s): "GLUCAP" in the last 168 hours. Lipid Profile: No results for input(s): "CHOL", "HDL", "LDLCALC", "TRIG", "CHOLHDL", "LDLDIRECT" in the last 72 hours. Thyroid Function Tests: No results for input(s): "TSH", "T4TOTAL", "FREET4", "T3FREE", "THYROIDAB" in the last 72  hours. Anemia Panel: No results for input(s): "VITAMINB12", "FOLATE", "FERRITIN", "TIBC", "IRON", "RETICCTPCT" in the last 72 hours. Urine analysis:    Component Value Date/Time   COLORURINE AMBER (A) 12/31/2022 2250   APPEARANCEUR CLOUDY (A) 12/31/2022 2250   LABSPEC 1.026 12/31/2022 2250   PHURINE 5.0 12/31/2022 2250   GLUCOSEU NEGATIVE 12/31/2022 2250   HGBUR MODERATE (A) 12/31/2022 2250   BILIRUBINUR NEGATIVE 12/31/2022 2250   BILIRUBINUR negative 04/19/2019 1354   KETONESUR NEGATIVE 12/31/2022 2250   PROTEINUR 100 (A) 12/31/2022 2250   UROBILINOGEN 0.2 04/19/2019 1354   UROBILINOGEN 1.0 05/21/2015 1050   NITRITE NEGATIVE 12/31/2022 2250   LEUKOCYTESUR MODERATE (A) 12/31/2022 2250   Sepsis Labs: '@LABRCNTIP'$ (procalcitonin:4,lacticidven:4)  ) Recent Results (from the past 240 hour(s))  Resp panel by RT-PCR (RSV, Flu A&B, Covid) Anterior Nasal Swab     Status: Abnormal    Collection Time: 12/31/22  3:40 PM   Specimen: Anterior Nasal Swab  Result Value Ref Range Status   SARS Coronavirus 2 by RT PCR NEGATIVE NEGATIVE Final    Comment: (NOTE) SARS-CoV-2 target nucleic acids are NOT DETECTED.  The SARS-CoV-2 RNA is generally detectable in upper respiratory specimens during the acute phase of infection. The lowest concentration of SARS-CoV-2 viral copies this assay can detect is 138 copies/mL. A negative result does not preclude SARS-Cov-2 infection and should not be used as the sole basis for treatment or other patient management decisions. A negative result may occur with  improper specimen collection/handling, submission of specimen other than nasopharyngeal swab, presence of viral mutation(s) within the areas targeted by this assay, and inadequate number of viral copies(<138 copies/mL). A negative result must be combined with clinical observations, patient history, and epidemiological information. The expected result is Negative.  Fact Sheet for Patients:  EntrepreneurPulse.com.au  Fact Sheet for Healthcare Providers:  IncredibleEmployment.be  This test is no t yet approved or cleared by the [place] FDA and  has been authorized for detection and/or diagnosis of SARS-CoV-2 by FDA under an Emergency Use Authorization (EUA). This EUA will remain  in effect (meaning this test can be used) for the duration of the COVID-19 declaration under Section 564(b)(1) of the Act, 21 U.S.C.section 360bbb-3(b)(1), unless the authorization is terminated  or revoked sooner.       Influenza A by PCR NEGATIVE NEGATIVE Final   Influenza B by PCR NEGATIVE NEGATIVE Final    Comment: (NOTE) The Xpert Xpress SARS-CoV-2/FLU/RSV plus assay is intended as an aid in the diagnosis of influenza from Nasopharyngeal swab specimens and should not be used as a sole basis for treatment. Nasal washings and aspirates are unacceptable for  Xpert Xpress SARS-CoV-2/FLU/RSV testing.  Fact Sheet for Patients: EntrepreneurPulse.com.au  Fact Sheet for Healthcare Providers: IncredibleEmployment.be  This test is not yet approved or cleared by the [place] FDA and has been authorized for detection and/or diagnosis of SARS-CoV-2 by FDA under an Emergency Use Authorization (EUA). This EUA will remain in effect (meaning this test can be used) for the duration of the COVID-19 declaration under Section 564(b)(1) of the Act, 21 U.S.C. section 360bbb-3(b)(1), unless the authorization is terminated or revoked.     Resp Syncytial Virus by PCR POSITIVE (A) NEGATIVE Final    Comment: (NOTE) Fact Sheet for Patients: EntrepreneurPulse.com.au  Fact Sheet for Healthcare Providers: IncredibleEmployment.be  This test is not yet approved or cleared by the [place] FDA and has been authorized for detection and/or diagnosis of SARS-CoV-2 by FDA under an Emergency Use Authorization (EUA).  This EUA will remain in effect (meaning this test can be used) for the duration of the COVID-19 declaration under Section 564(b)(1) of the Act, 21 U.S.C. section 360bbb-3(b)(1), unless the authorization is terminated or revoked.  Performed at [facility] [address] [place], [place] [address]   Culture, blood (routine x 2) Call MD if unable to obtain prior to antibiotics being given     Status: None (Preliminary result)   Collection Time: 12/31/22 11:29 PM   Specimen: BLOOD  Result Value Ref Range Status   Specimen Description BLOOD BLOOD LEFT HAND  Final   Special Requests   Final    BOTTLES DRAWN AEROBIC ONLY Blood Culture results may not be optimal due to an inadequate volume of blood received in culture bottles   Culture   Final    NO GROWTH 2 DAYS Performed at [facility], [address] [place], [place] [address]    Report Status PENDING  Incomplete  Culture,  blood (routine x 2) Call MD if unable to obtain prior to antibiotics being given     Status: None (Preliminary result)   Collection Time: 12/31/22 11:33 PM   Specimen: BLOOD  Result Value Ref Range Status   Specimen Description BLOOD BLOOD RIGHT HAND  Final   Special Requests   Final    BOTTLES DRAWN AEROBIC AND ANAEROBIC Blood Culture adequate volume   Culture   Final    NO GROWTH 2 DAYS Performed at [facility], [address] [place], [place] [address]    Report Status PENDING  Incomplete  Culture, Urine (Do not remove urinary catheter, catheter placed by urology or difficult to place)     Status: Abnormal (Preliminary result)   Collection Time: 01/01/23  1:50 AM   Specimen: Urine, Catheterized  Result Value Ref Range Status   Specimen Description   Final    URINE, CATHETERIZED Performed at [facility], [address] [place], [place] [address]    Special Requests   Final    NONE Performed at [facility], [address] [place], [place] [address]    Culture (A)  Final    >=100,000 COLONIES/mL ENTEROCOCCUS FAECALIS SUSCEPTIBILITIES TO FOLLOW CULTURE REINCUBATED FOR BETTER GROWTH Performed at [facility], [place] [address], [place], [place] [address]    Report Status PENDING  Incomplete      Studies: No results found.  Scheduled Meds:  alfuzosin  10 mg Oral Q breakfast   atorvastatin  40 mg Oral Daily   Chlorhexidine Gluconate Cloth  6 each Topical Daily   dextromethorphan-guaiFENesin  1 tablet Oral BID   enoxaparin (LOVENOX) injection  40 mg Subcutaneous Q24H   ferrous sulfate  325 mg Oral BID   finasteride  5 mg Oral Daily   pantoprazole  40 mg Oral Daily   pregabalin  75 mg Oral BID   cyanocobalamin  500 mcg Oral Daily    Continuous Infusions:  ampicillin-sulbactam (UNASYN) IV 3 g (01/02/23 1445)   azithromycin 500 mg (01/01/23 2100)     LOS: 1 day     Alma Friendly, MD [facility]  If 7PM-7AM, please contact  night-coverage [email or web address] 01/02/2023, 3:02 PM

## 2023-01-02 NOTE — Telephone Encounter (Signed)
Daughter [name] states that [facility] changed catheter on 01/25, HH is able to change catheters until his next apt.  The daughter requested to  move apt out to a later date.  Patient admitted to hospital on 01/31 for RSV and pneumonia.  Pt was supposed to f/u for a possible VT but daughter states pt is not mobile at this time.  They will start PT back once pt is out of the hospital.  Apt moved to 03/25.

## 2023-01-02 NOTE — TOC Initial Note (Addendum)
Transition of Care [facility]) - Initial/Assessment Note    Patient Details  Name: Jared Bridges MRN: [id number] Date of Birth: Oct 21, 1930  [facility] of [facility] CM/SW Contact:    [name], LCSW Phone Number: 01/02/2023, 12:23 PM  Clinical Narrative:                 Patient from home with spouse and daughter. Pt has a high readmission risk score and is known to TOC from previous admissions. Pt uses motor chair, and sara steady for DME.   Pt active with [facility] for RN/PT/OT.  Pt will return home with resumption of these services. Clarise Cruz with [facility] aware. Pt will need HH resumption orders at dc.   Daughter asking if a [facility] aide can be added to the [facility] orders. Updated MD.  Pt will need EMS transport home per daughter.  TOC will follow.  Expected Discharge Plan: [place] Barriers to Discharge: Continued Medical Work up   Patient Goals and CMS Choice Patient states their goals for this hospitalization and ongoing recovery are:: go home          Expected Discharge Plan and Services In-house Referral: Clinical Social Work   Post Acute Care Choice: Resumption of Svcs/PTA Provider Living arrangements for the past 2 months: Single Family Home                                      Prior Living Arrangements/Services Living arrangements for the past 2 months: Single Family Home Lives with:: Adult Children, Spouse Patient language and need for interpreter reviewed:: Yes Do you feel safe going back to the place where you live?: Yes      Need for Family Participation in Patient Care: Yes (Comment) Care giver support system in place?: Yes (comment) Current home services: DME, Home OT, Home PT, Home RN Criminal Activity/Legal Involvement Pertinent to Current Situation/Hospitalization: No - Comment as needed  Activities of Daily Living Home Assistive Devices/Equipment: Blood pressure cuff, Cane (specify quad or straight), Oxygen, Nebulizer,  Wheelchair ADL Screening (condition at time of admission) Patient's cognitive ability adequate to safely complete daily activities?: Yes Is the patient deaf or have difficulty hearing?: Yes Does the patient have difficulty seeing, even when wearing glasses/contacts?: No Does the patient have difficulty concentrating, remembering, or making decisions?: Yes Patient able to express need for assistance with ADLs?: Yes Does the patient have difficulty dressing or bathing?: Yes Independently performs ADLs?: No Communication: Independent Dressing (OT): Needs assistance Is this a change from baseline?: Pre-admission baseline Grooming: Needs assistance Is this a change from baseline?: Pre-admission baseline Feeding: Independent Bathing: Needs assistance Is this a change from baseline?: Pre-admission baseline Toileting: Needs assistance Is this a change from baseline?: Pre-admission baseline In/Out Bed: Needs assistance Is this a change from baseline?: Pre-admission baseline Walks in Home: Needs assistance Is this a change from baseline?: Pre-admission baseline Does the patient have difficulty walking or climbing stairs?: Yes Weakness of Legs: Both Weakness of Arms/Hands: Both  Permission Sought/Granted                  Emotional Assessment       Orientation: : Oriented to Self, Oriented to Place Alcohol / Substance Use: Not Applicable Psych Involvement: No (comment)  Admission diagnosis:  RSV (respiratory syncytial virus infection) [B33.8] Hypoxia [R09.02] Acute respiratory failure with hypoxia ([place]) [J96.01] Influenza-like illness [J11.1] Community acquired  pneumonia of left lower lobe of lung [J18.9] Patient Active Problem List   Diagnosis Date Noted   Acute respiratory failure with hypoxia ([place]) 12/31/2022   Pneumonia 12/31/2022   Generalized weakness 11/27/2022   C. difficile diarrhea 11/26/2022   Acute urinary retention 11/26/2022   Decreased appetite 11/22/2022    Atelectasis 08/10/2022   Iron deficiency anemia secondary to blood loss (chronic) 08/08/2022   Grade I diastolic dysfunction 51/76/1607   Hyperlipidemia associated with type 2 diabetes mellitus ([place]) 06/26/2022   Hypertension associated with type 2 diabetes mellitus ([place]) 06/26/2022   History of CVA (cerebrovascular accident) 06/26/2022   Vascular dementia without behavioral disturbance ([place]) 06/26/2022   Neurodegenerative cognitive impairment ([place]) 06/26/2022   Normochromic normocytic anemia    Mixed hyperlipidemia 06/16/2022   Chronic diastolic CHF (congestive heart failure) ([place]) 06/16/2022   GERD (gastroesophageal reflux disease) 06/16/2022   Loss of weight 03/10/2022   History of peptic ulcer disease 09/03/2021   Wheelchair dependence 09/02/2021   Quadriplegia ([place]) 09/02/2021   Sleep disturbance    Controlled type 2 diabetes mellitus with hyperglycemia, without long-term current use of insulin ([name]    Central cord syndrome at C7 level of cervical spinal cord ([name] 07/05/2021   Epidural hematoma ([name] 07/02/2021   Lower urinary tract symptoms (LUTS) 10/31/2020   Prostate cancer ([place]) 06/20/2020   Elevated PSA 04/18/2020   Cobalamin deficiency 02/22/2020   Intractable chronic migraine without aura 02/20/2020   Altered mental status 02/20/2020   Asthenia 02/20/2020   Diabetic peripheral neuropathy ([place]) 02/20/2020   Obstructive sleep apnea of adult 02/20/2020   Medicare annual wellness visit, subsequent 12/07/2019   Depressed mood 12/07/2019   Vitamin D deficiency 10/25/2019   Memory loss 10/25/2019   Fall 10/25/2019   Nonrheumatic aortic valve stenosis 03/23/2018   Migraine headaches 12/29/2017   TIA (transient ischemic attack) 12/28/2017   Carpal tunnel syndrome, left 07/22/2017   Acquired trigger finger 07/22/2017   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   Total knee replacement status 02/28/2016   CVA (cerebral infarction) 12/19/2015   Carotid stenosis  12/19/2015   Wound infection after surgery 06/29/2015   OA (osteoarthritis) of knee 05/28/2015   Orthostatic hypotension 11/21/2014   Dizziness 11/16/2014   Polymyalgia rheumatica ([place]) 09/12/2014   Dehydration 08/16/2014   Iron deficiency anemia due to chronic blood loss 08/16/2014   DJD (degenerative joint disease) 08/16/2014   Essential hypertension    Cataract, nuclear 04/11/2014   Complaining of back-related symptom 03/15/2014   Arthralgia of hip or thigh 03/15/2014   Gonalgia 10/10/2013   Left knee pain 10/10/2013   1st degree AV block 08/04/2013   Infection of the upper respiratory tract 04/05/2013   Cataract 02/24/2013   Pseudoaphakia 02/24/2013   Episode of syncope 01/11/2013   Cardiac murmur 11/09/2012   Near syncope 11/09/2012   Anxiety 06/18/2012   Depression, recurrent ([place]) 06/18/2012   Difficulty hearing 06/18/2012   Adaptive colitis 06/18/2012   Arthritis, degenerative 06/18/2012   Hearing impairment 06/18/2012   Multinodular goiter 01/29/2012   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 [place] [address]  Phone: [phone] Fax: [place], [place] [address] [place] [address] Phone: [phone] Fax: [phone]     Social Determinants of Health ([place]) Social History: [place]erm[place]: No Food Insecurity (01/01/2023)  Housing: Low Risk  (01/01/2023)  Transportation Needs: No Transportation Needs (01/01/2023)  Utilities: Not At Risk (01/01/2023)  Recent Concern: Utilities - At Risk  (11/27/2022)  Depression (PHQ2-9): Low Risk  (01/03/2021)  Tobacco Use: Medium Risk (01/01/2023)   SDOH Interventions:     Readmission Risk Interventions    01/02/2023   12:22 PM 06/23/2022    1:26 PM  Readmission Risk Prevention Plan  Transportation Screening Complete Complete  HRI or [facility]  Complete  Social Work Consult for [place] Planning/Counseling  Complete  Palliative Care Screening  Not Applicable  Medication Review Press photographer) Complete Complete  HRI or [place] Complete   SW Recovery Care/Counseling Consult Complete   [place] Not Applicable

## 2023-01-03 DIAGNOSIS — J121 Respiratory syncytial virus pneumonia: Secondary | ICD-10-CM | POA: Diagnosis not present

## 2023-01-03 DIAGNOSIS — J9601 Acute respiratory failure with hypoxia: Secondary | ICD-10-CM | POA: Diagnosis not present

## 2023-01-03 DIAGNOSIS — R531 Weakness: Secondary | ICD-10-CM | POA: Diagnosis not present

## 2023-01-03 DIAGNOSIS — J690 Pneumonitis due to inhalation of food and vomit: Secondary | ICD-10-CM

## 2023-01-03 LAB — BASIC METABOLIC PANEL
Anion gap: 7 (ref 5–15)
BUN: 10 mg/dL (ref 8–23)
CO2: 25 mmol/L (ref 22–32)
Calcium: 8.3 mg/dL — ABNORMAL LOW (ref 8.9–10.3)
Chloride: 107 mmol/L (ref 98–111)
Creatinine, Ser: 0.65 mg/dL (ref 0.61–1.24)
GFR, Estimated: >60 mL/min (ref >60)
Glucose, Bld: 100 mg/dL — ABNORMAL HIGH (ref 70–99)
Potassium: 3.5 mmol/L (ref 3.5–5.1)
Sodium: 139 mmol/L (ref 135–145)

## 2023-01-03 LAB — CBC WITH DIFFERENTIAL/PLATELET
Abs Immature Granulocytes: 0.07 10*3/uL (ref 0.00–0.07)
Basophils Absolute: 0 10*3/uL (ref 0.0–0.1)
Basophils Relative: 1 %
Eosinophils Absolute: 0.2 10*3/uL (ref 0.0–0.5)
Eosinophils Relative: 3 %
HCT: 27.8 % — ABNORMAL LOW (ref 39.0–52.0)
Hemoglobin: 8.2 g/dL — ABNORMAL LOW (ref 13.0–17.0)
Immature Granulocytes: 1 %
Lymphocytes Relative: 11 %
Lymphs Abs: 0.7 10*3/uL (ref 0.7–4.0)
MCH: 27.8 pg (ref 26.0–34.0)
MCHC: 29.5 g/dL — ABNORMAL LOW (ref 30.0–36.0)
MCV: 94.2 fL (ref 80.0–100.0)
Monocytes Absolute: 0.6 10*3/uL (ref 0.1–1.0)
Monocytes Relative: 10 %
Neutro Abs: 4.5 10*3/uL (ref 1.7–7.7)
Neutrophils Relative %: 74 %
Platelets: 197 10*3/uL (ref 150–400)
RBC: 2.95 MIL/uL — ABNORMAL LOW (ref 4.22–5.81)
RDW: 17.2 % — ABNORMAL HIGH (ref 11.5–15.5)
WBC: 6 10*3/uL (ref 4.0–10.5)
nRBC: 0 % (ref 0.0–0.2)

## 2023-01-03 MED ORDER — AMOXICILLIN-POT CLAVULANATE 875-125 MG PO TABS: 1.0000 | ORAL_TABLET | Freq: Two times a day (BID) | ORAL | 0 refills | Status: AC

## 2023-01-03 MED ORDER — AMOXICILLIN-POT CLAVULANATE 875-125 MG PO TABS: 1.0000 | ORAL_TABLET | Freq: Two times a day (BID) | ORAL | Status: DC

## 2023-01-03 MED ORDER — ALBUTEROL SULFATE HFA 108 (90 BASE) MCG/ACT IN AERS: 2.0000 | INHALATION_SPRAY | Freq: Four times a day (QID) | RESPIRATORY_TRACT | 0 refills | Status: DC | PRN

## 2023-01-03 NOTE — Discharge Summary (Signed)
Physician Discharge Summary   Patient: Jared Bridges DOB: August 15, 1930  Admit date:     12/31/2022  Discharge date: 01/03/23  Discharge Physician: Alma Friendly   PCP: Juluis Pitch, MD   Recommendations at discharge:   Follow-up with PCP in 1 week  Discharge Diagnoses: Principal Problem:   Acute respiratory failure with hypoxia ([name] Active Problems:   Iron deficiency anemia due to chronic blood loss   Hypercholesterolemia   Lower urinary tract symptoms (LUTS)   Chronic diastolic CHF (congestive heart failure) ([name]   GERD (gastroesophageal reflux disease)   Generalized weakness   Pneumonia    Hospital Course: Jared Bridges is a 89 y.o. male with medical history significant of iron deficiency anemia, CKD, essential hypertension, hyperlipidemia, vascular dementia, polymyalgia rheumatica, type 2 diabetes diet-controlled, presents to the ED due to cough and generalized weakness. Patient is oriented to self but not to place or year so is not able to provide any history.  He does note that he has been coughing, and reports that that cough is productive of yellow sputum. Family had told the ER provider that patient has been on room air at home and maintaining his oxygen saturations until he developed these infectious symptoms about a week ago.  In the ED he was maintaining oxygen sats 87% on room air, but corrected on 2 L nasal cannula. Patient is nonambulatory at home, lives with daughter.    Today, patient appears to be doing very well, able to feed himself with good appetite.  Stable for discharge with home health PT/OT/RN/aide.  Discussed discharge plans with daughter over the phone, verbalized understanding.    Assessment and Plan:  Acute respiratory failure with hypoxia- now on RA RSV infection Aspiration PNA Patient has used oxygen in the past but that was discontinued until he developed infectious symptoms last week RSV positive Strep pneumo  negative, Legionella negative CT chest shows left lower lobe pneumonia with possible aspiration in the right lower lobe Required 2 L nasal cannula, weaned off Procalcitonin 0.19 BC X 2 NGTD S/P Unasyn, azithromycin---> switched to PO Augmentin to complete 7 days of AB Aspiration precautions   UTI UA suspicious for UTI although indwelling foley  UC growing E.faecalis  Continue with PO Augmentin as above   Hypokalemia Replaced prn   Generalized weakness Reported generalized weakness, although chart does report a history of functional quadriplegia Likely related to acute illness   GERD (gastroesophageal reflux disease) Continue Protonix   Chronic diastolic CHF (congestive heart failure) ([name] Restart lasix    Lower urinary tract symptoms (LUTS) Chronic indwelling Foley catheter Continue alfuzosin and finasteride Continue Foley catheter placed by urology (history of difficult placement, recently changed on 12/25/2022)   Hypercholesterolemia Continue statin   Iron deficiency anemia due to chronic blood loss Hemoglobin around baseline No signs of bleeding Continue iron and B12 supplement        Consultants: None Procedures performed: None Disposition: Home health Diet recommendation:  Cardiac diet   DISCHARGE MEDICATION: Allergies as of 01/03/2023       Reactions   Iodine Hives, Itching, Other (See Comments)   very allergic(per daughter), can pre-med with benadryl   Calan [verapamil] Other (See Comments)   Weakness   Cymbalta [duloxetine Hcl] Other (See Comments)   Makes patient have jerking motions.   Iodinated Contrast Media Hives, Itching, Other (See Comments)   Can pre-med with benadryl   Latex Other (See Comments)   Redness iritation   Lisinopril  Other (See Comments)   Makes patient have jerking motions.   Neurontin [gabapentin] Other (See Comments)   Makes patient have jerking motions.   Zoloft [sertraline Hcl] Diarrhea, Other (See Comments)   Makes  patient have jerking motions.   Valium [diazepam] Other (See Comments)   "Did not work"   Herbalist Hcl] Other (See Comments)   Dizziness   Glimepiride Other (See Comments)   "Did not work"   Melatonin Nausea Only   Tape Other (See Comments)   Skin irritation        Medication List     STOP taking these medications    mirtazapine 15 MG tablet Commonly known as: Remeron       TAKE these medications    alfuzosin 10 MG 24 hr tablet Commonly known as: UROXATRAL Take 1 tablet (10 mg total) by mouth daily with breakfast.   amoxicillin-clavulanate 875-125 MG tablet Commonly known as: AUGMENTIN Take 1 tablet by mouth 2 (two) times daily for 4 days.   atorvastatin 40 MG tablet Commonly known as: LIPITOR Take 1 tablet (40 mg total) by mouth daily.   Calcium 200 MG Tabs Take 400 mg by mouth in the morning and at bedtime.   cherry syrup syrup Take by mouth.   cyanocobalamin 500 MCG tablet Commonly known as: VITAMIN B12 Take 500 mcg by mouth daily.   dextromethorphan-guaiFENesin 30-600 MG 12hr tablet Commonly known as: MUCINEX DM Take 1 tablet by mouth daily as needed for cough.   ferrous sulfate 325 (65 FE) MG tablet Take 1 tablet (325 mg total) by mouth 2 (two) times daily with a meal. Mon, wed, fri What changed:  when to take this additional instructions   finasteride 5 MG tablet Commonly known as: PROSCAR Take 1 tablet (5 mg total) by mouth daily.   FIRST-DUKES MOUTHWASH MT Use as directed 5 mLs in the mouth or throat 4 (four) times daily.   furosemide 20 MG tablet Commonly known as: Lasix Take 1 tablet (20 mg total) by mouth every other day. Tuesday, Thursday and Saturday   magic mouthwash (nystatin, lidocaine, diphenhydrAMINE, alum & mag hydroxide) suspension Swish and spit 5 mLs 4 (four) times daily as needed for mouth pain.   Otezla 30 MG Tabs Generic drug: Apremilast Take 30 mg by mouth 2 (two) times daily.   pantoprazole 40 MG  tablet Commonly known as: Protonix Take 1 tablet (40 mg total) by mouth 2 (two) times daily. What changed: when to take this   pregabalin 75 MG capsule Commonly known as: LYRICA Take 1 capsule (75 mg total) by mouth 2 (two) times daily.   topiramate 50 MG tablet Commonly known as: TOPAMAX Take 0.5 tablets (25 mg total) by mouth 2 (two) times daily. What changed: how much to take   Vitamin D3 250 MCG (10000 UT) Tabs Take 1,000 Units by mouth daily.   Xtandi 80 MG tablet Generic drug: enzalutamide Take 80 mg by mouth daily.   zolpidem 5 MG tablet Commonly known as: AMBIEN Take 5-10 mg by mouth at bedtime as needed for sleep.        Follow-up Information     Juluis Pitch, MD. Schedule an appointment as soon as possible for a visit in 1 week(s).   Specialty: Family Medicine Contact information: [address] [place] [address] [phone]                Discharge Exam: Filed Weights   12/31/22 1545 12/31/22 2227  Weight:  77 kg 71.7 kg   General: NAD, alert, awake  Cardiovascular: S1, S2 present Respiratory: CTAB Abdomen: Soft, nontender, nondistended, bowel sounds present Musculoskeletal: No bilateral pedal edema noted Skin: Normal Psychiatry: Normal mood   Condition at discharge: stable    The results of significant diagnostics from this hospitalization (including imaging, microbiology, ancillary and laboratory) are listed below for reference.   Imaging Studies: CT Chest Wo Contrast  Result Date: 12/31/2022 CLINICAL DATA:  Cough and congestion EXAM: CT CHEST WITHOUT CONTRAST TECHNIQUE: Multidetector CT imaging of the chest was performed following the standard protocol without IV contrast. RADIATION DOSE REDUCTION: This exam was performed according to the departmental dose-optimization program which includes automated exposure control, adjustment of the mA and/or kV according to patient size and/or use of iterative reconstruction technique.  COMPARISON:  Chest x-ray same day. CT chest abdomen and pelvis 04/27/2022. FINDINGS: Cardiovascular: There is aneurysmal dilatation of the ascending aorta measuring 4.2 cm. Distal aortic arch is dilated measuring 4 cm. There are atherosclerotic calcifications of the aorta. Heart is normal in size. There is no pericardial effusion. Mediastinum/Nodes: There are numerous bilateral thyroid nodules measuring up to 8 mm. Thyroid calcifications are present. There has been no significant interval change. There are no enlarged mediastinal, hilar or axillary lymph nodes. Esophagus is within normal limits. Lungs/Pleura: There is a small left pleural effusion. There is patchy atelectasis and airspace disease in the left lower lobe with some tree-in-bud opacities. There some multifocal mild patchy ground-glass opacities in the right lower lobe. There is a new nodular density in the superior segment of the left lower lobe measuring 3 mm image 4/37. There also few new 2 mm pulmonary nodules in the right middle lobe and a few new scattered sub 2 mm nodules in the left upper lobe. Otherwise, scattered right-sided pulmonary nodules measuring 3 mm or less appear stable. There is no evidence for pneumothorax. There secretions in the distal trachea, left mainstem bronchus and left lower lobe bronchus. Upper Abdomen: No acute abnormality. Gallbladder is surgically absent. Musculoskeletal: Degenerative changes affect the spine. IMPRESSION: 1. Small left pleural effusion. 2. Left lower lobe atelectasis and airspace disease with tree-in-bud opacities worrisome for pneumonia. There secretions in the trachea, left mainstem and left lower lobe bronchus worrisome for aspiration. 3. Mild patchy ground-glass opacities in the right lower lobe may be infectious/inflammatory. 4. Multiple new pulmonary nodules measuring up to 3 mm. Per Fleischner Society Guidelines, if patient is low risk for malignancy, no routine follow-up imaging is recommended.  If patient is high risk for malignancy, a non-contrast Chest CT at 12 months is optional. If performed and the nodule is stable at 12 months, no further follow-up is recommended. These guidelines do not apply to immunocompromised patients and patients with cancer. Follow up in patients with significant comorbidities as clinically warranted. For lung cancer screening, adhere to Lung-RADS guidelines. Reference: Radiology. 2017; 284(1):228-43. 5. Stable aneurysmal dilatation of the ascending thoracic aorta and distal aortic arch measuring up to 4.2 cm. Recommend annual imaging followup by CTA or MRA. This recommendation follows 2010 ACCF/AHA/AATS/ACR/ASA/SCA/SCAI/SIR/STS/SVM Guidelines for the Diagnosis and Management of Patients with Thoracic Aortic Disease. Circulation. 2010; 121: G891-Q945. Aortic aneurysm NOS (ICD10-I71.9) 6. Stable thyroid nodules. Aortic Atherosclerosis (ICD10-I70.0). Electronically Signed   By: Ronney Asters M.D.   On: 12/31/2022 18:55   DG Chest 2 View  Result Date: 12/31/2022 CLINICAL DATA:  Cough and congestion. Weakness. EXAM: CHEST - 2 VIEW COMPARISON:  Radiographs 08/10/2022, CT 04/27/2022 FINDINGS: Patient is rotated.  The heart is normal in size. Aortic atherosclerosis. There is a small left pleural effusion with associated basilar opacity, slightly increased from prior exam. The right lung is clear. No pulmonary edema. No pneumothorax. IMPRESSION: Small left pleural effusion with associated basilar opacity, slightly increased from prior exam. Electronically Signed   By: Keith Rake M.D.   On: 12/31/2022 17:28    Microbiology: Results for orders placed or performed during the hospital encounter of 12/31/22  Resp panel by RT-PCR (RSV, Flu A&B, Covid) Anterior Nasal Swab     Status: Abnormal   Collection Time: 12/31/22  3:40 PM   Specimen: Anterior Nasal Swab  Result Value Ref Range Status   SARS Coronavirus 2 by RT PCR NEGATIVE NEGATIVE Final    Comment:  (NOTE) SARS-CoV-2 target nucleic acids are NOT DETECTED.  The SARS-CoV-2 RNA is generally detectable in upper respiratory specimens during the acute phase of infection. The lowest concentration of SARS-CoV-2 viral copies this assay can detect is 138 copies/mL. A negative result does not preclude SARS-Cov-2 infection and should not be used as the sole basis for treatment or other patient management decisions. A negative result may occur with  improper specimen collection/handling, submission of specimen other than nasopharyngeal swab, presence of viral mutation(s) within the areas targeted by this assay, and inadequate number of viral copies(<138 copies/mL). A negative result must be combined with clinical observations, patient history, and epidemiological information. The expected result is Negative.  Fact Sheet for Patients:  EntrepreneurPulse.com.au  Fact Sheet for Healthcare Providers:  IncredibleEmployment.be  This test is no t yet approved or cleared by the [place] FDA and  has been authorized for detection and/or diagnosis of SARS-CoV-2 by FDA under an Emergency Use Authorization (EUA). This EUA will remain  in effect (meaning this test can be used) for the duration of the COVID-19 declaration under Section 564(b)(1) of the Act, 21 U.S.C.section 360bbb-3(b)(1), unless the authorization is terminated  or revoked sooner.       Influenza A by PCR NEGATIVE NEGATIVE Final   Influenza B by PCR NEGATIVE NEGATIVE Final    Comment: (NOTE) The Xpert Xpress SARS-CoV-2/FLU/RSV plus assay is intended as an aid in the diagnosis of influenza from Nasopharyngeal swab specimens and should not be used as a sole basis for treatment. Nasal washings and aspirates are unacceptable for Xpert Xpress SARS-CoV-2/FLU/RSV testing.  Fact Sheet for Patients: EntrepreneurPulse.com.au  Fact Sheet for Healthcare  Providers: IncredibleEmployment.be  This test is not yet approved or cleared by the [place] FDA and has been authorized for detection and/or diagnosis of SARS-CoV-2 by FDA under an Emergency Use Authorization (EUA). This EUA will remain in effect (meaning this test can be used) for the duration of the COVID-19 declaration under Section 564(b)(1) of the Act, 21 U.S.C. section 360bbb-3(b)(1), unless the authorization is terminated or revoked.     Resp Syncytial Virus by PCR POSITIVE (A) NEGATIVE Final    Comment: (NOTE) Fact Sheet for Patients: EntrepreneurPulse.com.au  Fact Sheet for Healthcare Providers: IncredibleEmployment.be  This test is not yet approved or cleared by the [place] FDA and has been authorized for detection and/or diagnosis of SARS-CoV-2 by FDA under an Emergency Use Authorization (EUA). This EUA will remain in effect (meaning this test can be used) for the duration of the COVID-19 declaration under Section 564(b)(1) of the Act, 21 U.S.C. section 360bbb-3(b)(1), unless the authorization is terminated or revoked.  Performed at [facility], [address] [place], [place] [address]   Culture,  blood (routine x 2) Call MD if unable to obtain prior to antibiotics being given     Status: None (Preliminary result)   Collection Time: 12/31/22 11:29 PM   Specimen: BLOOD  Result Value Ref Range Status   Specimen Description BLOOD BLOOD LEFT HAND  Final   Special Requests   Final    BOTTLES DRAWN AEROBIC ONLY Blood Culture results may not be optimal due to an inadequate volume of blood received in culture bottles   Culture   Final    NO GROWTH 3 DAYS Performed at [facility], [address] [place], [place] [address]    Report Status PENDING  Incomplete  Culture, blood (routine x 2) Call MD if unable to obtain prior to antibiotics being given     Status: None (Preliminary result)   Collection Time:  12/31/22 11:33 PM   Specimen: BLOOD  Result Value Ref Range Status   Specimen Description BLOOD BLOOD RIGHT HAND  Final   Special Requests   Final    BOTTLES DRAWN AEROBIC AND ANAEROBIC Blood Culture adequate volume   Culture   Final    NO GROWTH 3 DAYS Performed at [facility], [address] [place], [place] [address]    Report Status PENDING  Incomplete  Culture, Urine (Do not remove urinary catheter, catheter placed by urology or difficult to place)     Status: Abnormal (Preliminary result)   Collection Time: 01/01/23  1:50 AM   Specimen: Urine, Catheterized  Result Value Ref Range Status   Specimen Description   Final    URINE, CATHETERIZED Performed at [facility], [address], [place], [place] [address]    Special Requests   Final    NONE Performed at [facility], [address], [place], [place] [address]    Culture >=100,000 COLONIES/mL ENTEROCOCCUS FAECALIS (A)  Final   Report Status PENDING  Incomplete   Organism ID, Bacteria ENTEROCOCCUS FAECALIS (A)  Final      Susceptibility   Enterococcus faecalis - MIC*    AMPICILLIN <=2 SENSITIVE Sensitive     NITROFURANTOIN <=16 SENSITIVE Sensitive     VANCOMYCIN 1 SENSITIVE Sensitive     * >=100,000 COLONIES/mL ENTEROCOCCUS FAECALIS    Labs: CBC: Recent Labs  Lab 12/31/22 1605 01/01/23 0433 01/02/23 0501 01/03/23 0542  WBC 8.6 6.4 6.0 6.0  NEUTROABS 6.9 4.8 4.5 4.5  HGB 9.5* 9.1* 8.4* 8.2*  HCT 31.2* 31.2* 27.7* 27.8*  MCV 92.3 92.9 92.0 94.2  PLT 255 234 229 570   Basic Metabolic Panel: Recent Labs  Lab 12/31/22 1605 01/01/23 0433 01/02/23 0501 01/03/23 0542  NA 137 134* 137 139  K 3.7 3.2* 3.5 3.5  CL 100 101 103 107  CO2 '26 26 25 25  '$ GLUCOSE 143* 87 124* 100*  BUN '16 15 15 10  '$ CREATININE 0.74 0.69 0.80 0.65  CALCIUM 9.1 8.7* 8.4* 8.3*  MG  --  1.9  --   --    Liver Function Tests: Recent Labs  Lab 12/31/22 1605 01/01/23 0433  AST 15 10*  ALT 7 6  ALKPHOS 57 45  BILITOT 0.7 0.4  PROT  6.8 5.7*  ALBUMIN 2.7* 2.3*   CBG: No results for input(s): "GLUCAP" in the last 168 hours.  Discharge time spent: greater than 30 minutes.  Signed: Alma Friendly, MD [facility] 01/03/2023

## 2023-01-05 ENCOUNTER — Telehealth (INDEPENDENT_AMBULATORY_CARE_PROVIDER_SITE_OTHER): Payer: Medicare Other | Admitting: Gastroenterology

## 2023-01-05 DIAGNOSIS — D500 Iron deficiency anemia secondary to blood loss (chronic): Secondary | ICD-10-CM

## 2023-01-05 DIAGNOSIS — Z8619 Personal history of other infectious and parasitic diseases: Secondary | ICD-10-CM | POA: Diagnosis not present

## 2023-01-05 DIAGNOSIS — R630 Anorexia: Secondary | ICD-10-CM

## 2023-01-05 DIAGNOSIS — R634 Abnormal weight loss: Secondary | ICD-10-CM

## 2023-01-05 LAB — CULTURE, BLOOD (ROUTINE X 2)
Culture: NO GROWTH
Culture: NO GROWTH
Special Requests: ADEQUATE

## 2023-01-05 NOTE — Progress Notes (Addendum)
Primary Care Physician:  Juluis Pitch, MD  Primary GI: [facility]   Patient Location: Home   Provider Location: [facility]   Reason for Visit: follow up of weight loss/C diff    Persons present on the virtual encounter, with roles: Jared Bridges L. Alver Sorrow, MSN, APRN, AGNP-C, Alyson Locket. Joya Gaskins, patient, Deirdre Pippins, Patient's daughter/caregiver    Total time (minutes) spent on medical discussion:15  minutes  Virtual Visit via MyChart Video Note visit is conducted virtually and was requested by patient.   I connected with [name] on 01/05/23 at  8:15 AM EST by telephone and verified that I am speaking with the correct person using two identifiers.   I discussed the limitations, risks, security and privacy concerns of performing an evaluation and management service by telephone and the availability of in person appointments. I also discussed with the patient that there may be a patient responsible charge related to this service. The patient expressed understanding and agreed to proceed.  Chief Complaint  Patient presents with   Gastroesophageal Reflux    Patient doing a virtual appt with daughter [name]. Follow up. Takes imodium once daily. Appetite has gotten better since being in hospital with pneumonia.    History of Present Illness: Jared Bridges is a 89 y.o. male with a history of depression, diverticulitis, history of gastric ulcers, PMR, stroke, TIA, diabetes    History: Recent hostpialization in September for anemia, hgb 7.1 on presentation with heme positive stool. EGD on 9/9 overall unrevealing, colonoscopy done thereater also unrevealing. Givens capsule study on 9/14 with normal appearing small bowel. Advised to continue PO iron BID, check iron stores in 3 months, continue PPI BID and follow hgb closely. Patient's daughter had contacted the office stating patients appetite had not been good, recommended tos tart BOOST shakes TID by Dr. Jenetta Downer and potentially  start Remeron to help with appeite.    Last visit 12/21,  weight previously was 190 lbs, he is currently around 170 lbs. Appetite is low. He is having diarrhea daily since recent antibiotic therapy. C diff testing done which was positive. Started on vancomycin taper and remeron '15mg'$  QHS for weight loss.   Subsequently patient became worse, altered/weak, admitted to hospital on 12/28 prior to vanc being started   Most recent labs with hgb 8.2 on 2/3, Iron 26, TIBC 158, saturation 16%   Present:  Patient's daughter [name] present on visit to help provide history. Reports he recently became ill with a respiratory illness and was admitted again with RSV last Wednesday, discharged on 2/3  Diarrhea is much improved, she is doing imodium once daily, if he skips a few days without a BM she will hold this, stools are much more solid. Usually just one BM per day. He does have some thrush on his tongue which he is using magic mouthwash for this. He is on augmentin now, he is taking a daily probiotic and is eating yogurt some as well.   Appetite is better. Declined some during his episode of RSV. She states that he has been eating more since d/c. Patient denies any pain. No nausea or vomiting. Denies any rectal bleeding or melena. Is maintained on PO iron BID. Is taking protonix '40mg'$  daily.   Patient's PCP is working on getting patient in home nursing care.   Past Medical History:  Diagnosis Date   Acute cholecystitis 04/05/2016   Anemia, iron deficiency    Chronic headaches    Migraines   Chronic  kidney disease    Resolved   Depression    Diverticulitis    Essential hypertension    GI bleed    Dr. Laural Golden - 1998   Hypercholesteremia    Orthostatic hypotension    Peptic ulcer disease    Peripheral neuropathy    Polymyalgia rheumatica ([name]    Prostate cancer ([place])    Rectal bleeding 11/26/2017   Added automatically from request for surgery [id number]   Stroke [facility])    Residual trouble reading  and writing   Syncope    Thyroid nodule    TIA (transient ischemic attack) 12/2017   Type 2 diabetes mellitus ([place])    Upper GI bleed 06/18/2012     Past Surgical History:  Procedure Laterality Date   APPENDECTOMY     as child   BACK SURGERY     2000 IDET SPINAL PROC   CHOLECYSTECTOMY N/A 04/07/2016   Procedure: CHOLECYSTECTOMY;  Surgeon: Aviva Signs, MD;  Location: [facility];  Service: General;  Laterality: N/A;   [place]   COLONOSCOPY N/A 01/07/2018   Procedure: COLONOSCOPY;  Surgeon: Rogene Houston, MD;  Location: [facility];  Service: Endoscopy;  Laterality: N/A;  1:00   COLONOSCOPY WITH PROPOFOL N/A 08/12/2022   Procedure: COLONOSCOPY WITH PROPOFOL;  Surgeon: Harvel Quale, MD;  [facility];  Service: Gastroenterology;  Laterality: N/A;  ASA 3   CRYOTHERAPY     ESOPHAGOGASTRODUODENOSCOPY (EGD) WITH PROPOFOL N/A 08/09/2022   Procedure: ESOPHAGOGASTRODUODENOSCOPY (EGD) WITH PROPOFOL;  Surgeon: Harvel Quale, MD;  [facility];  Service: Gastroenterology;  Laterality: N/A;   EYE SURGERY     right cataract with lens implant   FEMUR SURGERY     RT [place] STUDY N/A 08/13/2022   Procedure: GIVENS CAPSULE STUDY;  Surgeon: Harvel Quale, MD;  [facility];  Service: Gastroenterology;  Laterality: N/A;   HAND SURGERY     RIGHT   HERNIA REPAIR     1-right inguinal, 3- left inguinal   IRRIGATION AND DEBRIDEMENT KNEE Left 07/09/2015   Procedure: LEFT KNEE IRRIGATION AND DEBRIDEMENT WOUND CLOSURE;  Surgeon: Gaynelle Arabian, MD;  Location: [facility];  Service: Orthopedics;  Laterality: Left;   JOINT REPLACEMENT  02/28/2016   lt knee revision   KNEE DEBRIDEMENT     2016   LEFT   MENISCUS REPAIR     LEFT   POLYPECTOMY  01/07/2018   Procedure: POLYPECTOMY;  Surgeon: Rogene Houston, MD;  [facility];  Service: Endoscopy;;  colon    POLYPECTOMY  08/12/2022    Procedure: POLYPECTOMY;  Surgeon: Harvel Quale, MD;  [facility];  Service: Gastroenterology;;   SHOULDER OPEN ROTATOR CUFF REPAIR     RT SHOULDER   SPINAL FUSION     SPINAL FUSION     TONSILLECTOMY     TOTAL KNEE ARTHROPLASTY Left 05/28/2015   Procedure: LEFT TOTAL KNEE ARTHROPLASTY;  Surgeon: Gaynelle Arabian, MD;  Location: [facility];  Service: Orthopedics;  Laterality: Left;   TOTAL KNEE REVISION Left 02/28/2016   Procedure: LEFT TOTAL KNEE REVISION;  Surgeon: Leandrew Koyanagi, MD;  Location: [facility];  Service: Orthopedics;  Laterality: Left;   TRIGGER FINGER RELEASE     RT HAND   1990S   UVULECTOMY  2004   TURBINATE,TONSIL,ADENOID     Current  Meds  Medication Sig   albuterol (VENTOLIN HFA) 108 (90 Base) MCG/ACT inhaler Inhale 2 puffs into the lungs every 6 (six) hours as needed for wheezing or shortness of breath.   alfuzosin (UROXATRAL) 10 MG 24 hr tablet Take 1 tablet (10 mg total) by mouth daily with breakfast.   amoxicillin-clavulanate (AUGMENTIN) 875-125 MG tablet Take 1 tablet by mouth 2 (two) times daily for 4 days.   Apremilast (OTEZLA) 30 MG TABS Take 30 mg by mouth 2 (two) times daily.   atorvastatin (LIPITOR) 40 MG tablet Take 1 tablet (40 mg total) by mouth daily.   Calcium 200 MG TABS Take 400 mg by mouth in the morning and at bedtime.   Cholecalciferol (VITAMIN D3) 1000 units CAPS Take 1,000 Units by mouth daily.   cyanocobalamin 100 MCG tablet Take 1,000 mcg by mouth daily.   dextromethorphan-guaiFENesin (MUCINEX DM) 30-600 MG 12hr tablet Take 1 tablet by mouth daily as needed for cough.   enzalutamide (XTANDI) 80 MG tablet Take 80 mg by mouth daily.   ferrous sulfate 325 (65 FE) MG tablet Take 1 tablet (325 mg total) by mouth 2 (two) times daily with a meal. Mon, wed, fri (Patient taking differently: Take 325 mg by mouth 2 (two) times daily.)   finasteride (PROSCAR) 5 MG tablet Take 1 tablet (5 mg total) by mouth daily.   furosemide (LASIX) 20 MG  tablet Take 1 tablet (20 mg total) by mouth every other day. Tuesday, Thursday and Saturday   loperamide (IMODIUM) 2 MG capsule Take 2 mg by mouth once. Once daily   magic mouthwash (nystatin, lidocaine, diphenhydrAMINE, alum & mag hydroxide) suspension Swish and spit 5 mLs 4 (four) times daily as needed for mouth pain.   pantoprazole (PROTONIX) 40 MG tablet Take 1 tablet (40 mg total) by mouth 2 (two) times daily. (Patient taking differently: Take 40 mg by mouth daily.)   pregabalin (LYRICA) 75 MG capsule Take 1 capsule (75 mg total) by mouth 2 (two) times daily.   topiramate (TOPAMAX) 50 MG tablet Take 0.5 tablets (25 mg total) by mouth 2 (two) times daily. (Patient taking differently: Take 50 mg by mouth 2 (two) times daily.)   zolpidem (AMBIEN) 5 MG tablet Take 5-10 mg by mouth at bedtime as needed for sleep.   [DISCONTINUED] Diphenhyd-Hydrocort-Nystatin (FIRST-DUKES MOUTHWASH MT) Use as directed 5 mLs in the mouth or throat 4 (four) times daily.     Family History  Problem Relation Age of Onset   Colon cancer Sister    Hypertension Other    Depression Brother    Depression Daughter     Social History   Socioeconomic History   Marital status: Married    Spouse name: Not on file   Number of children: Not on file   Years of education: Not on file   Highest education level: Not on file  Occupational History   Not on file  Tobacco Use   Smoking status: Former    Packs/day: 0.10    Years: 18.00    Total pack years: 1.80    Types: Cigars, Cigarettes    Start date: 12/18/1958    Quit date: 12/01/1986    Years since quitting: 36.1   Smokeless tobacco: Never   Tobacco comments:    Smoke cigars daily per daughter.  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   Social Determinants of Health   Financial Resource Strain: Not on file  Food Insecurity: No Food Insecurity (01/01/2023)   Hunger Vital Sign    Worried About Running Out of Food in the Last Year: Never true    Ran Out of Food in the Last Year: Never true  Transportation Needs: No Transportation Needs (01/01/2023)   PRAPARE - Hydrologist (Medical): No    Lack of Transportation (Non-Medical): No  Physical Activity: Not on file  Stress: Not on file  Social Connections: Not on file   Review of Systems: Gen: Denies fever, chills, anorexia. Denies fatigue, weakness, weight loss.  CV: Denies chest pain, palpitations, syncope, peripheral edema, and claudication. Resp: Denies dyspnea at rest, cough, wheezing, coughing up blood, and pleurisy. GI: see HPI Derm: Denies rash, itching, dry skin Psych: Denies depression, anxiety, memory loss, confusion. No homicidal or suicidal ideation.  Heme: Denies bruising, bleeding, and enlarged lymph nodes.  Observations/Objective: No distress. Unable to perform physical exam due to telephone encounter. No video available.   Assessment and Plan: Boubacar Lerette. Yankowski is a 89 y.o. male who presents today for follow up of C diff, weight loss and IDA   C diff: completed vancomycin course, stools more firm now, daughter is giving imodium usually once daily, will hold if he goes a day or two without a BM, otherwise, he usually is having a BM daily. No rectal bleeding or melena. Taking daily probiotic and eating yogurt some. He is on Augmentin currently for pneumonia, I encouraged them to continue with daily probiotic and yogurt, will make me aware of any recurrence of diarrhea.  IDA: EGD/Colonoscopy and givens capsule study completed as part of workup without specific findings to explain IDA.  last hgb 8.2 during admission, taking iron PO BID. As above, no rectal bleeding or melena. Decline in hgb likely  multifactorial in setting of acute illness. Should continue PO Iron BID, will recheck h&h in two weeks. Daughter will make me aware of any rectal bleeding or melena.  Weight loss/decreased appetite: also likely multifactorial in setting of multiple acute illnesses, chronic co-morbidities. Appetite sounds to be increased some and patient is eating better per daughter's report. Did not tolerate remeron previously as it made him too weak/confused and was therefore advised to stop. Should liberalize diet, protein shakes BID between meals for added nutrition.    -repeat h&h in 2 weeks  -continue PO Iron BID -Continue daily probiotic/yogurt - continue PPI daily -continue imodium once daily PRN  -patient's daughter to make me aware of recurrence of diarrhea, rectal bleeding, melena -liberalize diet, protein shakes BID between meals    Follow Up Instructions: 2 months    I discussed the assessment and treatment plan with the patient. The patient was provided an opportunity to ask questions and all were answered. The patient agreed with the plan and demonstrated an understanding of the instructions.   The patient was advised to call back or seek an in-person evaluation if the symptoms worsen or if the condition fails to improve as anticipated.  I provided 15 minutes of face-to-face time during this MyChart Video encounter.  Lameeka Schleifer L. Alver Sorrow, MSN, APRN, AGNP-C Adult-Gerontology Nurse Practitioner [facility]  I have reviewed the note and agree with the APP's assessment as  described in this progress note  [name], MD Gastroenterology and Hepatology [name]

## 2023-01-05 NOTE — Patient Instructions (Signed)
-  repeat h&h labs in 2 weeks  -continue PO Iron twice daily -Continue daily probiotic/yogurt - continue protonix daily -continue imodium once daily as needed -please make me aware of recurrence of diarrhea, rectal bleeding, black stools -liberalize diet to whatever patient Is willing to eat/can tolerate -can do protein shakes twice daily between meals for added nutrition  Follow up 2 months

## 2023-01-07 LAB — URINE CULTURE: Culture: >=100000 — AB

## 2023-01-12 ENCOUNTER — Ambulatory Visit: Payer: Medicare Other | Admitting: Urology

## 2023-01-12 ENCOUNTER — Telehealth: Payer: Self-pay

## 2023-01-12 MED ORDER — CLOTRIMAZOLE-BETAMETHASONE 1-0.05 % EX CREA: 1.0000 | TOPICAL_CREAM | Freq: Two times a day (BID) | CUTANEOUS | 0 refills | Status: DC

## 2023-01-12 NOTE — Telephone Encounter (Signed)
Return call to Pollyann Kennedy, home health nurse, making him aware that Clotrimazole was sent to the pharmacy. Patient is to apply this cream to affected area twice daily. Kendal voiced understanding

## 2023-01-29 ENCOUNTER — Encounter: Payer: Self-pay | Admitting: Radiology

## 2023-02-12 ENCOUNTER — Telehealth: Payer: Self-pay

## 2023-02-12 NOTE — Telephone Encounter (Signed)
Return called to [place]. Patient has indwelling foley catheter that is due to be change on 03/19 through Jeanie Sewer voiced that dad is un mobile and want to know if patient can have a tele -visited with Dr. Alyson Ingles instead of an in person visited. Patient is scheduled for 03/25 possible voiding trial. [name] voiced that dad is not strong enough to come into office. [name] is aware that a task will be sent to Dr. Alyson Ingles on advising a tele- visited and someone will reach back out after MD response. [name] voiced understanding

## 2023-02-16 ENCOUNTER — Telehealth (INDEPENDENT_AMBULATORY_CARE_PROVIDER_SITE_OTHER): Payer: Self-pay | Admitting: *Deleted

## 2023-02-16 NOTE — Telephone Encounter (Signed)
Patient's daughter [name] called and left vm to report patient has had diarrhea for 2 days and feeling weak. He takes imodium daily and has had to give one bid yesterday. I called [name] to get more information and she reports he has not had any diarrhea today. She was originally worried about c diff but states stools are doing better today. Reports his vitals are good and he is eating and drinking good. She will call back if stools do not continue to improve or if any other concerns.

## 2023-02-16 NOTE — Telephone Encounter (Addendum)
Jared Bridges is aware that MD approved the patient can do a tele-visit. Jared Bridges will like to keep the existing appt day but change it to tele-visit, Jared Bridges voiced that she is changing her parent insurance to [facility] [employer] which will include traveling. She will provide a copy of the new insurance. Jared Bridges voiced understanding.

## 2023-02-19 ENCOUNTER — Encounter (INDEPENDENT_AMBULATORY_CARE_PROVIDER_SITE_OTHER): Payer: Self-pay

## 2023-02-19 ENCOUNTER — Telehealth (INDEPENDENT_AMBULATORY_CARE_PROVIDER_SITE_OTHER): Payer: Self-pay

## 2023-02-19 NOTE — Telephone Encounter (Signed)
Patient daughter [name] called today states the patient is still having issues with diarrhea. Patient was on Macrobid two weeks ago per Emily,but nothing since then. Patient is having 2-4 episodes per day of loose stools. Per [name] the patient's nurse Delilah Shan has not smelled what he thought was C-Diff. Patient has been taking one imodium per day. [name] did say she gave the patient a second one today. Patient vitals are good and he is not running a fever. They want to know what they need to do, any medication recommendations, stool studies, blood work, if so they can have Delilah Shan the RN with [facility] to get them and the patient has an appointment at the [facility] the [place] in [place] on March 04, 2023 and she can have them order anything needed. Please advise. [phone]

## 2023-02-20 ENCOUNTER — Other Ambulatory Visit (INDEPENDENT_AMBULATORY_CARE_PROVIDER_SITE_OTHER): Payer: Self-pay | Admitting: Gastroenterology

## 2023-02-20 DIAGNOSIS — R197 Diarrhea, unspecified: Secondary | ICD-10-CM

## 2023-02-20 NOTE — Telephone Encounter (Signed)
I have faxed the lab orders to Dyann Kief at [phone], and asked that once collect and test results are back to please send the lab results to our office at [phone]. [facility], Please see Emily's message below). Thanks,

## 2023-02-20 NOTE — Telephone Encounter (Signed)
I have faxed the lab orders to Dyann Kief at [phone], and asked that once collect and test results are back to please send the lab results to our office at [phone]. [facility], Please see Emily's message below). Thanks,

## 2023-02-23 ENCOUNTER — Encounter: Payer: Self-pay | Admitting: Urology

## 2023-02-23 ENCOUNTER — Telehealth (INDEPENDENT_AMBULATORY_CARE_PROVIDER_SITE_OTHER): Payer: Medicare Other | Admitting: Urology

## 2023-02-23 DIAGNOSIS — R338 Other retention of urine: Secondary | ICD-10-CM

## 2023-02-23 DIAGNOSIS — N138 Other obstructive and reflux uropathy: Secondary | ICD-10-CM | POA: Diagnosis not present

## 2023-02-23 DIAGNOSIS — N401 Enlarged prostate with lower urinary tract symptoms: Secondary | ICD-10-CM

## 2023-02-23 DIAGNOSIS — C61 Malignant neoplasm of prostate: Secondary | ICD-10-CM

## 2023-02-23 DIAGNOSIS — Z978 Presence of other specified devices: Secondary | ICD-10-CM

## 2023-02-23 MED ORDER — FINASTERIDE 5 MG PO TABS: 5.0000 mg | ORAL_TABLET | Freq: Every day | ORAL | 3 refills | Status: DC

## 2023-02-23 MED ORDER — ALFUZOSIN HCL ER 10 MG PO TB24: 10.0000 mg | ORAL_TABLET | Freq: Every day | ORAL | 3 refills | Status: DC

## 2023-02-23 NOTE — Patient Instructions (Signed)
Acute Urinary Retention, Male  Acute urinary retention is when a person cannot pee (urinate) at all, or can only pee a little. This can come on all of a sudden. If it is not treated, it can lead to kidney problems or other serious problems. What are the causes? A problem with the tube that drains the bladder (urethra). Problems with the nerves in the bladder. Tumors. Certain medicines. An infection. Having trouble pooping (constipation). What increases the risk? Older men are more at risk because their prostate gland may become larger as they age. Other conditions also can increase risk. These include: Diseases, such as multiple sclerosis. Injury to the spinal cord. Diabetes. A condition that affects the way the brain works, such as dementia. Holding back urine due to trauma or because you do not want to use the bathroom. What are the signs or symptoms? Trouble peeing. Pain in the lower belly. How is this treated? Treatment for this condition may include: Medicines. Placing a thin, germ-free tube (catheter) into the bladder to drain pee out of the body. Therapy to treat mental health conditions. Treatment for conditions that may cause this. If needed, you may be treated in the hospital for kidney problems or to manage other problems. Follow these instructions at home: Medicines Take over-the-counter and prescription medicines only as told by your doctor. Ask your doctor what medicines you should stay away from. If you were given an antibiotic medicine, take it as told by your doctor. Do not stop taking it, even if you start to feel better. General instructions Do not smoke or use any products that contain nicotine or tobacco. If you need help quitting, ask your doctor. Drink enough fluid to keep your pee pale yellow. If you were sent home with a tube that drains the bladder, take care of it as told by your doctor. Watch for changes in your symptoms. Tell your doctor about them. If  told, keep track of changes in your blood pressure at home. Tell your doctor about them. Keep all follow-up visits. Contact a doctor if: You have spasms in your bladder that you cannot stop. You leak pee when you have spasms. Get help right away if: You have chills or a fever. You have blood in your pee. You have a tube that drains pee from the bladder and these things happen: The tube stops draining pee. The tube falls out. Summary Acute urinary retention is when you cannot pee at all or you pee too little. If this condition is not treated, it can lead to kidney problems or other serious problems. If you were sent home with a tube (catheter) that drains the bladder, take care of it as told by your doctor. Watch for changes in your symptoms. Tell your doctor about them. This information is not intended to replace advice given to you by your health care provider. Make sure you discuss any questions you have with your health care provider. Document Revised: 08/08/2020 Document Reviewed: 08/08/2020 Elsevier Patient Education  2023 [employer]  

## 2023-02-23 NOTE — Progress Notes (Unsigned)
02/23/2023 11:51 AM   Jared Bridges 1930/06/10 [id number]  Referring provider: [name], [place] [place],  [place] [address]  Patient location: home Physician location: office I connected with  Jared Bridges on 02/23/23 by a video enabled telemedicine application and verified that I am speaking with the correct person using two identifiers.   I discussed the limitations of evaluation and management by telemedicine. The patient expressed understanding and agreed to proceed.     HPI: Patient going to [facility] for rehab and wound care. He has been treated for a UTI once with macrobid in the past 2 months   PMH: Past Medical History:  Diagnosis Date   Acute cholecystitis 04/05/2016   Anemia, iron deficiency    Chronic headaches    Migraines   Chronic kidney disease    Resolved   Depression    Diverticulitis    Essential hypertension    GI bleed    Dr. Laural Golden - 1998   Hypercholesteremia    Orthostatic hypotension    Peptic ulcer disease    Peripheral neuropathy    Polymyalgia rheumatica ([name]    Prostate cancer ([place])    Rectal bleeding 11/26/2017   Added automatically from request for surgery [id number]   Stroke [facility])    Residual trouble reading and writing   Syncope    Thyroid nodule    TIA (transient ischemic attack) 12/2017   Type 2 diabetes mellitus ([place])    Upper GI bleed 06/18/2012    Surgical History: Past Surgical History:  Procedure Laterality Date   APPENDECTOMY     as child   BACK SURGERY     2000 IDET SPINAL PROC   CHOLECYSTECTOMY N/A 04/07/2016   Procedure: CHOLECYSTECTOMY;  Surgeon: Aviva Signs, MD;  Location: [facility];  Service: General;  Laterality: N/A;   [place]   COLONOSCOPY N/A 01/07/2018   Procedure: COLONOSCOPY;  Surgeon: Rogene Houston, MD;  [facility];  Service: Endoscopy;  Laterality: N/A;  1:00   COLONOSCOPY WITH PROPOFOL N/A 08/12/2022   Procedure: COLONOSCOPY WITH PROPOFOL;   Surgeon: Harvel Quale, MD;  [facility]: [facility];  Service: Gastroenterology;  Laterality: N/A;  ASA 3   CRYOTHERAPY     ESOPHAGOGASTRODUODENOSCOPY (EGD) WITH PROPOFOL N/A 08/09/2022   Procedure: ESOPHAGOGASTRODUODENOSCOPY (EGD) WITH PROPOFOL;  Surgeon: Harvel Quale, MD;  [facility];  Service: Gastroenterology;  Laterality: N/A;   EYE SURGERY     right cataract with lens implant   FEMUR SURGERY     RT [employer] STUDY N/A 08/13/2022   Procedure: GIVENS CAPSULE STUDY;  Surgeon: Harvel Quale, MD;  Location: [facility];  Service: Gastroenterology;  Laterality: N/A;   HAND SURGERY     RIGHT   HERNIA REPAIR     1-right inguinal, 3- left inguinal   IRRIGATION AND DEBRIDEMENT KNEE Left 07/09/2015   Procedure: LEFT KNEE IRRIGATION AND DEBRIDEMENT WOUND CLOSURE;  Surgeon: Gaynelle Arabian, MD;  Location: [facility];  Service: Orthopedics;  Laterality: Left;   JOINT REPLACEMENT  02/28/2016   lt knee revision   KNEE DEBRIDEMENT     2016   LEFT   MENISCUS REPAIR     LEFT   POLYPECTOMY  01/07/2018   Procedure: POLYPECTOMY;  Surgeon: Rogene Houston, MD;  [facility];  Service: Endoscopy;;  colon  POLYPECTOMY  08/12/2022   Procedure: POLYPECTOMY;  Surgeon: Harvel Quale, MD;  [facility];  Service: Gastroenterology;;   SHOULDER OPEN ROTATOR CUFF REPAIR     RT SHOULDER   SPINAL FUSION     SPINAL FUSION     TONSILLECTOMY     TOTAL KNEE ARTHROPLASTY Left 05/28/2015   Procedure: LEFT TOTAL KNEE ARTHROPLASTY;  Surgeon: Gaynelle Arabian, MD;  Location: [facility];  Service: Orthopedics;  Laterality: Left;   TOTAL KNEE REVISION Left 02/28/2016   Procedure: LEFT TOTAL KNEE REVISION;  Surgeon: Leandrew Koyanagi, MD;  Location: [facility];  Service: Orthopedics;  Laterality: Left;   TRIGGER FINGER RELEASE     RT HAND   1990S   UVULECTOMY  2004   TURBINATE,TONSIL,ADENOID    Home Medications:  Allergies as of  02/23/2023       Reactions   Iodine Hives, Itching, Other (See Comments)   very allergic(per daughter), can pre-med with benadryl   Calan [verapamil] Other (See Comments)   Weakness   Cymbalta [duloxetine Hcl] Other (See Comments)   Makes patient have jerking motions.   Iodinated Contrast Media Hives, Itching, Other (See Comments)   Can pre-med with benadryl   Latex Other (See Comments)   Redness iritation   Lisinopril Other (See Comments)   Makes patient have jerking motions.   Neurontin [gabapentin] Other (See Comments)   Makes patient have jerking motions.   Zoloft [sertraline Hcl] Diarrhea, Other (See Comments)   Makes patient have jerking motions.   Valium [diazepam] Other (See Comments)   "Did not work"   Flomax [tamsulosin Hcl] Other (See Comments)   Dizziness   Glimepiride Other (See Comments)   "Did not work"   Melatonin Nausea Only   Tape Other (See Comments)   Skin irritation        Medication List        Accurate as of February 23, 2023 11:51 AM. If you have any questions, ask your nurse or doctor.          albuterol 108 (90 Base) MCG/ACT inhaler Commonly known as: VENTOLIN HFA Inhale 2 puffs into the lungs every 6 (six) hours as needed for wheezing or shortness of breath.   alfuzosin 10 MG 24 hr tablet Commonly known as: UROXATRAL Take 1 tablet (10 mg total) by mouth daily with breakfast.   atorvastatin 40 MG tablet Commonly known as: LIPITOR Take 1 tablet (40 mg total) by mouth daily.   Calcium 200 MG Tabs Take 400 mg by mouth in the morning and at bedtime.   cherry syrup syrup Take by mouth.   clotrimazole-betamethasone cream Commonly known as: Lotrisone Apply 1 Application topically 2 (two) times daily. Apply to the infected area twice a day   cyanocobalamin 100 MCG tablet Take 1,000 mcg by mouth daily.   dextromethorphan-guaiFENesin 30-600 MG 12hr tablet Commonly known as: MUCINEX DM Take 1 tablet by mouth daily as needed for cough.    ferrous sulfate 325 (65 FE) MG tablet Take 1 tablet (325 mg total) by mouth 2 (two) times daily with a meal. Mon, wed, fri What changed:  when to take this additional instructions   finasteride 5 MG tablet Commonly known as: PROSCAR Take 1 tablet (5 mg total) by mouth daily.   furosemide 20 MG tablet Commonly known as: Lasix Take 1 tablet (20 mg total) by mouth every other day. Tuesday, Thursday and Saturday   loperamide 2 MG capsule Commonly known as: IMODIUM Take 2  mg by mouth once. Once daily   magic mouthwash (nystatin, lidocaine, diphenhydrAMINE, alum & mag hydroxide) suspension Swish and spit 5 mLs 4 (four) times daily as needed for mouth pain.   Otezla 30 MG Tabs Generic drug: Apremilast Take 30 mg by mouth 2 (two) times daily.   pantoprazole 40 MG tablet Commonly known as: Protonix Take 1 tablet (40 mg total) by mouth 2 (two) times daily. What changed: when to take this   pregabalin 75 MG capsule Commonly known as: LYRICA Take 1 capsule (75 mg total) by mouth 2 (two) times daily.   topiramate 50 MG tablet Commonly known as: TOPAMAX Take 0.5 tablets (25 mg total) by mouth 2 (two) times daily. What changed: how much to take   Vitamin D3 25 MCG (1000 UT) capsule Generic drug: Cholecalciferol Take 1,000 Units by mouth daily.   Xtandi 80 MG tablet Generic drug: enzalutamide Take 80 mg by mouth daily.   zolpidem 5 MG tablet Commonly known as: AMBIEN Take 5-10 mg by mouth at bedtime as needed for sleep.        Allergies:  Allergies  Allergen Reactions   Iodine Hives, Itching and Other (See Comments)    very allergic(per daughter), can pre-med with benadryl   Calan [Verapamil] Other (See Comments)    Weakness   Cymbalta [Duloxetine Hcl] Other (See Comments)    Makes patient have jerking motions.   Iodinated Contrast Media Hives, Itching and Other (See Comments)    Can pre-med with benadryl   Latex Other (See Comments)    Redness iritation    Lisinopril Other (See Comments)    Makes patient have jerking motions.   Neurontin [Gabapentin] Other (See Comments)    Makes patient have jerking motions.   Zoloft [Sertraline Hcl] Diarrhea and Other (See Comments)    Makes patient have jerking motions.   Valium [Diazepam] Other (See Comments)    "Did not work"   Flomax [Tamsulosin Hcl] Other (See Comments)    Dizziness   Glimepiride Other (See Comments)    "Did not work"   Melatonin Nausea Only   Tape Other (See Comments)    Skin irritation    Family History: Family History  Problem Relation Age of Onset   Colon cancer Sister    Hypertension Other    Depression Brother    Depression Daughter     Social History:  reports that he quit smoking about 36 years ago. His smoking use included cigars and cigarettes. He started smoking about 64 years ago. He has a 1.80 pack-year smoking history. He has never used smokeless tobacco. He reports that he does not drink alcohol and does not use drugs.  ROS: All other review of systems were reviewed and are negative except what is noted above in HPI   Laboratory Data: Lab Results  Component Value Date   WBC 6.0 01/03/2023   HGB 8.2 (L) 01/03/2023   HCT 27.8 (L) 01/03/2023   MCV 94.2 01/03/2023   PLT 197 01/03/2023    Lab Results  Component Value Date   CREATININE 0.65 01/03/2023    Lab Results  Component Value Date   PSA 14.1 (H) 04/25/2020    No results found for: "TESTOSTERONE"  Lab Results  Component Value Date   HGBA1C 4.8 08/14/2022    Urinalysis    Component Value Date/Time   COLORURINE AMBER (A) 12/31/2022 2250   APPEARANCEUR CLOUDY (A) 12/31/2022 2250   LABSPEC 1.026 12/31/2022 2250   PHURINE 5.0 12/31/2022  [place] 12/31/2022 2250   HGBUR MODERATE (A) 12/31/2022 2250   BILIRUBINUR NEGATIVE 12/31/2022 2250   BILIRUBINUR negative 04/19/2019 1354   KETONESUR NEGATIVE 12/31/2022 2250   PROTEINUR 100 (A) 12/31/2022 2250   UROBILINOGEN 0.2  04/19/2019 1354   UROBILINOGEN 1.0 05/21/2015 1050   NITRITE NEGATIVE 12/31/2022 2250   LEUKOCYTESUR MODERATE (A) 12/31/2022 2250    Lab Results  Component Value Date   BACTERIA MANY (A) 12/31/2022    Pertinent Imaging: *** No results found for this or any previous visit.  No results found for this or any previous visit.  No results found for this or any previous visit.  No results found for this or any previous visit.  No results found for this or any previous visit.  No valid procedures specified. No results found for this or any previous visit.  No results found for this or any previous visit.   Assessment & Plan:    1. Acute urinary retention ***  2. Benign prostatic hyperplasia with urinary obstruction ***  3. Chronic indwelling Foley catheter ***  4. Prostate cancer ([name] ***   No follow-ups on file.  Nicolette Bang, MD  [facility] [place]

## 2023-02-23 NOTE — Telephone Encounter (Signed)
Per Vikki Ports, I responded to the patient via mychart message regarding her question, thanks!

## 2023-02-24 ENCOUNTER — Encounter: Payer: Self-pay | Admitting: Urology

## 2023-03-04 NOTE — Telephone Encounter (Signed)
I spoke with Delilah Shan he says he did collect labs on the patient and asked that I call his office back and asked that they fax the results to me. I spoke with [place] with [facility] she will fax. [facility] Phone # [phone] and fax # [phone].

## 2023-03-05 ENCOUNTER — Telehealth (INDEPENDENT_AMBULATORY_CARE_PROVIDER_SITE_OTHER): Payer: Medicare Other | Admitting: Gastroenterology

## 2023-03-09 ENCOUNTER — Ambulatory Visit (INDEPENDENT_AMBULATORY_CARE_PROVIDER_SITE_OTHER): Payer: Medicare Other | Admitting: Gastroenterology

## 2023-03-09 NOTE — Telephone Encounter (Signed)
I spoke with Jared Bridges at [facility] she states she will fax the lab results.

## 2023-03-10 NOTE — Telephone Encounter (Signed)
Spoke with Jared Bridges at [facility] she says she will fax the lab results over.

## 2023-03-11 ENCOUNTER — Encounter (INDEPENDENT_AMBULATORY_CARE_PROVIDER_SITE_OTHER): Payer: Self-pay

## 2023-03-11 NOTE — Telephone Encounter (Signed)
Results placed on [name]'s desk.

## 2023-03-11 NOTE — Telephone Encounter (Signed)
I called [facility], spoke with Revonda Standard she says she only sees the C Diff, but will have her manager to check to see if the Gi path panel was done, and they will call me back.

## 2023-03-11 NOTE — Telephone Encounter (Signed)
I spoke with Revonda Standard at [facility] she says she will fax me the results.

## 2023-03-12 ENCOUNTER — Telehealth (INDEPENDENT_AMBULATORY_CARE_PROVIDER_SITE_OTHER): Payer: Medicare Other | Admitting: Gastroenterology

## 2023-03-12 NOTE — Telephone Encounter (Signed)
I spoke with Jared Bridges at [facility] she says they called over to [facility] and found out that they did not perform the Gi path panel.

## 2023-03-12 NOTE — Telephone Encounter (Signed)
I spoke with [name] the patient daughter they are aware the GI path was not done. She says they are at the [place] in [place] with her dad and she will see if they can do the test there. If not she will let me know and we will order it at a local lab.

## 2023-03-25 ENCOUNTER — Encounter (HOSPITAL_COMMUNITY): Payer: Self-pay | Admitting: Emergency Medicine

## 2023-03-25 ENCOUNTER — Other Ambulatory Visit: Payer: Self-pay

## 2023-03-25 ENCOUNTER — Inpatient Hospital Stay (HOSPITAL_COMMUNITY)
Admission: EM | Admit: 2023-03-25 | Discharge: 2023-03-29 | DRG: 698 | Disposition: A | Discharge status: Home Health | Payer: No Typology Code available for payment source | Attending: Internal Medicine | Admitting: Internal Medicine

## 2023-03-25 ENCOUNTER — Emergency Department (HOSPITAL_COMMUNITY): Payer: No Typology Code available for payment source

## 2023-03-25 DIAGNOSIS — N401 Enlarged prostate with lower urinary tract symptoms: Secondary | ICD-10-CM | POA: Diagnosis present

## 2023-03-25 DIAGNOSIS — Z888 Allergy status to other drugs, medicaments and biological substances status: Secondary | ICD-10-CM

## 2023-03-25 DIAGNOSIS — I10 Essential (primary) hypertension: Secondary | ICD-10-CM | POA: Diagnosis present

## 2023-03-25 DIAGNOSIS — Z818 Family history of other mental and behavioral disorders: Secondary | ICD-10-CM

## 2023-03-25 DIAGNOSIS — E7800 Pure hypercholesterolemia, unspecified: Secondary | ICD-10-CM | POA: Diagnosis not present

## 2023-03-25 DIAGNOSIS — F0150 Vascular dementia without behavioral disturbance: Secondary | ICD-10-CM | POA: Diagnosis present

## 2023-03-25 DIAGNOSIS — R410 Disorientation, unspecified: Secondary | ICD-10-CM

## 2023-03-25 DIAGNOSIS — N138 Other obstructive and reflux uropathy: Secondary | ICD-10-CM | POA: Diagnosis present

## 2023-03-25 DIAGNOSIS — E1122 Type 2 diabetes mellitus with diabetic chronic kidney disease: Secondary | ICD-10-CM | POA: Diagnosis present

## 2023-03-25 DIAGNOSIS — Z8249 Family history of ischemic heart disease and other diseases of the circulatory system: Secondary | ICD-10-CM

## 2023-03-25 DIAGNOSIS — Z8546 Personal history of malignant neoplasm of prostate: Secondary | ICD-10-CM | POA: Diagnosis not present

## 2023-03-25 DIAGNOSIS — Z883 Allergy status to other anti-infective agents status: Secondary | ICD-10-CM

## 2023-03-25 DIAGNOSIS — Y846 Urinary catheterization as the cause of abnormal reaction of the patient, or of later complication, without mention of misadventure at the time of the procedure: Secondary | ICD-10-CM | POA: Diagnosis present

## 2023-03-25 DIAGNOSIS — Z8673 Personal history of transient ischemic attack (TIA), and cerebral infarction without residual deficits: Secondary | ICD-10-CM

## 2023-03-25 DIAGNOSIS — Z881 Allergy status to other antibiotic agents status: Secondary | ICD-10-CM

## 2023-03-25 DIAGNOSIS — L89154 Pressure ulcer of sacral region, stage 4: Secondary | ICD-10-CM | POA: Diagnosis present

## 2023-03-25 DIAGNOSIS — K219 Gastro-esophageal reflux disease without esophagitis: Secondary | ICD-10-CM | POA: Diagnosis present

## 2023-03-25 DIAGNOSIS — Z91040 Latex allergy status: Secondary | ICD-10-CM

## 2023-03-25 DIAGNOSIS — Z981 Arthrodesis status: Secondary | ICD-10-CM

## 2023-03-25 DIAGNOSIS — E876 Hypokalemia: Secondary | ICD-10-CM | POA: Diagnosis present

## 2023-03-25 DIAGNOSIS — Z91041 Radiographic dye allergy status: Secondary | ICD-10-CM

## 2023-03-25 DIAGNOSIS — N189 Chronic kidney disease, unspecified: Secondary | ICD-10-CM | POA: Diagnosis present

## 2023-03-25 DIAGNOSIS — E1165 Type 2 diabetes mellitus with hyperglycemia: Secondary | ICD-10-CM | POA: Diagnosis present

## 2023-03-25 DIAGNOSIS — F17290 Nicotine dependence, other tobacco product, uncomplicated: Secondary | ICD-10-CM | POA: Diagnosis present

## 2023-03-25 DIAGNOSIS — I130 Hypertensive heart and chronic kidney disease with heart failure and stage 1 through stage 4 chronic kidney disease, or unspecified chronic kidney disease: Secondary | ICD-10-CM | POA: Diagnosis present

## 2023-03-25 DIAGNOSIS — G9341 Metabolic encephalopathy: Secondary | ICD-10-CM | POA: Diagnosis present

## 2023-03-25 DIAGNOSIS — Z96652 Presence of left artificial knee joint: Secondary | ICD-10-CM | POA: Diagnosis present

## 2023-03-25 DIAGNOSIS — E782 Mixed hyperlipidemia: Secondary | ICD-10-CM | POA: Diagnosis present

## 2023-03-25 DIAGNOSIS — I5032 Chronic diastolic (congestive) heart failure: Secondary | ICD-10-CM | POA: Diagnosis present

## 2023-03-25 DIAGNOSIS — Z79899 Other long term (current) drug therapy: Secondary | ICD-10-CM

## 2023-03-25 DIAGNOSIS — E1142 Type 2 diabetes mellitus with diabetic polyneuropathy: Secondary | ICD-10-CM | POA: Diagnosis present

## 2023-03-25 DIAGNOSIS — B961 Klebsiella pneumoniae [K. pneumoniae] as the cause of diseases classified elsewhere: Secondary | ICD-10-CM | POA: Diagnosis present

## 2023-03-25 DIAGNOSIS — N390 Urinary tract infection, site not specified: Secondary | ICD-10-CM | POA: Diagnosis present

## 2023-03-25 DIAGNOSIS — M353 Polymyalgia rheumatica: Secondary | ICD-10-CM | POA: Diagnosis present

## 2023-03-25 DIAGNOSIS — T83518A Infection and inflammatory reaction due to other urinary catheter, initial encounter: Secondary | ICD-10-CM | POA: Diagnosis present

## 2023-03-25 DIAGNOSIS — Z800 Family history of malignant neoplasm of digestive organs: Secondary | ICD-10-CM | POA: Diagnosis not present

## 2023-03-25 DIAGNOSIS — N3000 Acute cystitis without hematuria: Secondary | ICD-10-CM | POA: Diagnosis not present

## 2023-03-25 DIAGNOSIS — R338 Other retention of urine: Secondary | ICD-10-CM | POA: Diagnosis not present

## 2023-03-25 LAB — COMPREHENSIVE METABOLIC PANEL
ALT: 10 U/L (ref 0–44)
AST: 13 U/L — ABNORMAL LOW (ref 15–41)
Albumin: 2.8 g/dL — ABNORMAL LOW (ref 3.5–5.0)
Alkaline Phosphatase: 52 U/L (ref 38–126)
Anion gap: 8 (ref 5–15)
BUN: 17 mg/dL (ref 8–23)
CO2: 25 mmol/L (ref 22–32)
Calcium: 9.6 mg/dL (ref 8.9–10.3)
Chloride: 103 mmol/L (ref 98–111)
Creatinine, Ser: 0.71 mg/dL (ref 0.61–1.24)
GFR, Estimated: >60 mL/min (ref >60)
Glucose, Bld: 104 mg/dL — ABNORMAL HIGH (ref 70–99)
Potassium: 3.6 mmol/L (ref 3.5–5.1)
Sodium: 136 mmol/L (ref 135–145)
Total Bilirubin: 0.9 mg/dL (ref 0.3–1.2)
Total Protein: 7.2 g/dL (ref 6.5–8.1)

## 2023-03-25 LAB — URINALYSIS, W/ REFLEX TO CULTURE (INFECTION SUSPECTED)
Bilirubin Urine: NEGATIVE
Glucose, UA: NEGATIVE mg/dL
Hgb urine dipstick: NEGATIVE
Ketones, ur: NEGATIVE mg/dL
Nitrite: POSITIVE — AB
Protein, ur: 100 mg/dL — AB
Specific Gravity, Urine: 1.023 (ref 1.005–1.030)
WBC, UA: >50 WBC/hpf (ref 0–5)
pH: 5 (ref 5.0–8.0)

## 2023-03-25 LAB — CBC WITH DIFFERENTIAL/PLATELET
Abs Immature Granulocytes: 0.05 10*3/uL (ref 0.00–0.07)
Basophils Absolute: 0 10*3/uL (ref 0.0–0.1)
Basophils Relative: 0 %
Eosinophils Absolute: 0.2 10*3/uL (ref 0.0–0.5)
Eosinophils Relative: 2 %
HCT: 34.2 % — ABNORMAL LOW (ref 39.0–52.0)
Hemoglobin: 10.6 g/dL — ABNORMAL LOW (ref 13.0–17.0)
Immature Granulocytes: 1 %
Lymphocytes Relative: 8 %
Lymphs Abs: 0.7 10*3/uL (ref 0.7–4.0)
MCH: 29 pg (ref 26.0–34.0)
MCHC: 31 g/dL (ref 30.0–36.0)
MCV: 93.7 fL (ref 80.0–100.0)
Monocytes Absolute: 0.7 10*3/uL (ref 0.1–1.0)
Monocytes Relative: 9 %
Neutro Abs: 6.8 10*3/uL (ref 1.7–7.7)
Neutrophils Relative %: 80 %
Platelets: 197 10*3/uL (ref 150–400)
RBC: 3.65 MIL/uL — ABNORMAL LOW (ref 4.22–5.81)
RDW: 15.6 % — ABNORMAL HIGH (ref 11.5–15.5)
WBC: 8.4 10*3/uL (ref 4.0–10.5)
nRBC: 0 % (ref 0.0–0.2)

## 2023-03-25 LAB — AMMONIA: Ammonia: 28 umol/L (ref 9–35)

## 2023-03-25 LAB — MAGNESIUM: Magnesium: 2 mg/dL (ref 1.7–2.4)

## 2023-03-25 LAB — TSH: TSH: 1.73 u[IU]/mL (ref 0.350–4.500)

## 2023-03-25 MED ORDER — OXYCODONE HCL 5 MG PO TABS: 5.0000 mg | ORAL_TABLET | ORAL | Status: DC | PRN

## 2023-03-25 MED ORDER — SODIUM CHLORIDE 0.9 % IV SOLN: INTRAVENOUS | Status: AC

## 2023-03-25 MED ORDER — ONDANSETRON HCL 4 MG/2ML IJ SOLN: 4.0000 mg | Freq: Four times a day (QID) | INTRAMUSCULAR | Status: DC | PRN

## 2023-03-25 MED ORDER — PREGABALIN 75 MG PO CAPS
75.0000 mg | ORAL_CAPSULE | Freq: Two times a day (BID) | ORAL | Status: DC
  Administered 2023-03-25 – 2023-03-29 (×8): 75 mg via ORAL
  Filled 2023-03-25 (×8): qty 1

## 2023-03-25 MED ORDER — ALFUZOSIN HCL ER 10 MG PO TB24
10.0000 mg | ORAL_TABLET | Freq: Every day | ORAL | Status: DC
  Administered 2023-03-26 – 2023-03-29 (×4): 10 mg via ORAL
  Filled 2023-03-25 (×4): qty 1

## 2023-03-25 MED ORDER — LACTATED RINGERS IV BOLUS
1000.0000 mL | Freq: Once | INTRAVENOUS | Status: AC
  Administered 2023-03-25: 1000 mL via INTRAVENOUS

## 2023-03-25 MED ORDER — ATORVASTATIN CALCIUM 40 MG PO TABS
40.0000 mg | ORAL_TABLET | Freq: Every day | ORAL | Status: DC
  Administered 2023-03-26 – 2023-03-29 (×4): 40 mg via ORAL
  Filled 2023-03-25 (×4): qty 1

## 2023-03-25 MED ORDER — SODIUM CHLORIDE 0.9 % IV SOLN: 1.0000 g | INTRAVENOUS | Status: DC

## 2023-03-25 MED ORDER — ACETAMINOPHEN 325 MG PO TABS: 650.0000 mg | ORAL_TABLET | Freq: Four times a day (QID) | ORAL | Status: DC | PRN

## 2023-03-25 MED ORDER — HEPARIN SODIUM (PORCINE) 5000 UNIT/ML IJ SOLN
5000.0000 [IU] | Freq: Three times a day (TID) | INTRAMUSCULAR | Status: DC
  Administered 2023-03-25 – 2023-03-29 (×12): 5000 [IU] via SUBCUTANEOUS
  Filled 2023-03-25 (×12): qty 1

## 2023-03-25 MED ORDER — SODIUM CHLORIDE 0.9 % IV SOLN
1.0000 g | Freq: Once | INTRAVENOUS | Status: AC
  Administered 2023-03-25: 1 g via INTRAVENOUS
  Filled 2023-03-25: qty 10

## 2023-03-25 MED ORDER — ACETAMINOPHEN 650 MG RE SUPP: 650.0000 mg | Freq: Four times a day (QID) | RECTAL | Status: DC | PRN

## 2023-03-25 MED ORDER — TOPIRAMATE 25 MG PO TABS
50.0000 mg | ORAL_TABLET | Freq: Two times a day (BID) | ORAL | Status: DC
  Administered 2023-03-25 – 2023-03-29 (×8): 50 mg via ORAL
  Filled 2023-03-25 (×9): qty 2

## 2023-03-25 MED ORDER — ONDANSETRON HCL 4 MG PO TABS: 4.0000 mg | ORAL_TABLET | Freq: Four times a day (QID) | ORAL | Status: DC | PRN

## 2023-03-25 MED ORDER — FINASTERIDE 5 MG PO TABS
5.0000 mg | ORAL_TABLET | Freq: Every day | ORAL | Status: DC
  Administered 2023-03-26 – 2023-03-29 (×4): 5 mg via ORAL
  Filled 2023-03-25 (×4): qty 1

## 2023-03-25 MED ORDER — PANTOPRAZOLE SODIUM 40 MG PO TBEC
40.0000 mg | DELAYED_RELEASE_TABLET | Freq: Every day | ORAL | Status: DC
  Administered 2023-03-26 – 2023-03-29 (×4): 40 mg via ORAL
  Filled 2023-03-25 (×4): qty 1

## 2023-03-25 NOTE — ED Triage Notes (Signed)
Pt's foley catheter is leaking from the bag. Pt thinks he has a UTI as well.

## 2023-03-25 NOTE — ED Provider Notes (Signed)
[place] EMERGENCY DEPARTMENT AT [facility] Provider Note  CSN: [id number] Arrival date & time: 03/25/23 1558  Chief Complaint(s) No chief complaint on file.  HPI Jared Bridges is a 89 y.o. male with history of vascular dementia, spinal cord injury, hyperlipidemia, hypertension, diabetes presenting to the emergency department with weakness.  Patient's family report that [facility] [place], was discharged last Friday since then has been increasingly confused with decreased responsiveness.  He does have some dementia but usually more participatory.  He was treated for urinary infection when he was admitted there.  He has had a chronic cough which is may be slightly worse than normal.  No fevers.  No vomiting or diarrhea.  Also recently had C. difficile but has tested negative since that diagnosis.  Family noticed that he seemed to have some lower abdominal pain and drainage around his Foley catheter and were concerned for recurrent UTI.  History limited due to dementia.   Past Medical History Past Medical History:  Diagnosis Date   Acute cholecystitis 04/05/2016   Anemia, iron deficiency    Chronic headaches    Migraines   Chronic kidney disease    Resolved   Depression    Diverticulitis    Essential hypertension    GI bleed    Dr. Karilyn Cota - 1998   Hypercholesteremia    Orthostatic hypotension    Peptic ulcer disease    Peripheral neuropathy    Polymyalgia rheumatica    Prostate cancer    Rectal bleeding 11/26/2017   Added automatically from request for surgery [id number]   Stroke    Residual trouble reading and writing   Syncope    Thyroid nodule    TIA (transient ischemic attack) 12/2017   Type 2 diabetes mellitus    Upper GI bleed 06/18/2012   Patient Active Problem List   Diagnosis Date Noted   Acute metabolic encephalopathy 03/25/2023   History of Clostridioides difficile colitis 01/05/2023   Acute respiratory  failure with hypoxia 12/31/2022   Pneumonia 12/31/2022   Generalized weakness 11/27/2022   C. difficile diarrhea 11/26/2022   Acute urinary retention 11/26/2022   Decreased appetite 11/22/2022   Atelectasis 08/10/2022   Iron deficiency anemia secondary to blood loss (chronic) 08/08/2022   Grade I diastolic dysfunction 07/01/2022   Hyperlipidemia associated with type 2 diabetes mellitus 06/26/2022   Hypertension associated with type 2 diabetes mellitus 06/26/2022   History of CVA (cerebrovascular accident) 06/26/2022   Vascular dementia without behavioral disturbance 06/26/2022   Neurodegenerative cognitive impairment 06/26/2022   Normochromic normocytic anemia    Mixed hyperlipidemia 06/16/2022   Chronic diastolic CHF (congestive heart failure) 06/16/2022   GERD (gastroesophageal reflux disease) 06/16/2022   Loss of weight 03/10/2022   History of peptic ulcer disease 09/03/2021   Wheelchair dependence 09/02/2021   Quadriplegia 09/02/2021   Sleep disturbance    Controlled type 2 diabetes mellitus with hyperglycemia, without long-term current use of insulin    Central cord syndrome at C7 level of cervical spinal cord 07/05/2021   Epidural hematoma 07/02/2021   Lower urinary tract symptoms (LUTS) 10/31/2020   Prostate cancer 06/20/2020   Elevated PSA 04/18/2020   Cobalamin deficiency 02/22/2020   Intractable chronic migraine without aura 02/20/2020   Altered mental status 02/20/2020   Asthenia 02/20/2020   Diabetic peripheral neuropathy 02/20/2020   Obstructive sleep apnea of adult 02/20/2020   Medicare annual wellness visit, subsequent 12/07/2019   Depressed mood  12/07/2019   Vitamin D deficiency 10/25/2019   Memory loss 10/25/2019   Fall 10/25/2019   Nonrheumatic aortic valve stenosis 03/23/2018   Migraine headaches 12/29/2017   TIA (transient ischemic attack) 12/28/2017   Carpal tunnel syndrome, left 07/22/2017   Acquired trigger finger 07/22/2017   Aseptic loosening of  prosthetic knee 02/28/2016   Total knee replacement status 02/28/2016   CVA (cerebral infarction) 12/19/2015   Carotid stenosis 12/19/2015   Wound infection after surgery 06/29/2015   OA (osteoarthritis) of knee 05/28/2015   Orthostatic hypotension 11/21/2014   Dizziness 11/16/2014   Polymyalgia rheumatica 09/12/2014   Dehydration 08/16/2014   Iron deficiency anemia due to chronic blood loss 08/16/2014   DJD (degenerative joint disease) 08/16/2014   Essential hypertension    Cataract, nuclear 04/11/2014   Complaining of back-related symptom 03/15/2014   Arthralgia of hip or thigh 03/15/2014   Gonalgia 10/10/2013   Left knee pain 10/10/2013   1st degree AV block 08/04/2013   Infection of the upper respiratory tract 04/05/2013   Cataract 02/24/2013   Pseudoaphakia 02/24/2013   Episode of syncope 01/11/2013   Cardiac murmur 11/09/2012   Near syncope 11/09/2012   Anxiety 06/18/2012   Depression, recurrent 06/18/2012   Difficulty hearing 06/18/2012   Adaptive colitis 06/18/2012   Arthritis, degenerative 06/18/2012   Hearing impairment 06/18/2012   Multinodular goiter 01/29/2012   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   Adenoma of large intestine 05/31/2011   Cephalalgia 05/31/2011   Hypercholesterolemia 05/31/2011   Benign prostatic hyperplasia with urinary obstruction 05/31/2011   LBP (low back pain) 05/31/2011   Essential (primary) hypertension 05/31/2011   Headache(784.0) 05/31/2011   Hyperplasia of prostate 05/31/2011   Home Medication(s) Prior to Admission medications   Medication Sig Start Date End Date Taking? Authorizing Provider  albuterol (VENTOLIN HFA) 108 (90 Base) MCG/ACT inhaler Inhale 2 puffs into the lungs every 6 (six) hours as needed for wheezing or shortness of breath. 01/03/23   Briant Cedar, MD  alfuzosin (UROXATRAL) 10 MG 24 hr tablet Take 1 tablet (10 mg total) by mouth daily with breakfast. 02/23/23   McKenzie, Mardene Celeste, MD  Apremilast (OTEZLA) 30 MG TABS  Take 30 mg by mouth 2 (two) times daily.    [provider]  atorvastatin (LIPITOR) 40 MG tablet Take 1 tablet (40 mg total) by mouth daily. 07/09/22   Sharee Holster, NP  Calcium 200 MG TABS Take 400 mg by mouth in the morning and at bedtime.    [provider]  cherry syrup syrup Take by mouth. Patient not taking: Reported on 01/05/2023 11/28/22   [provider]  Cholecalciferol (VITAMIN D3) 1000 units CAPS Take 1,000 Units by mouth daily.    [provider]  clotrimazole-betamethasone (LOTRISONE) cream Apply 1 Application topically 2 (two) times daily. Apply to the infected area twice a day 01/12/23   Malen Gauze, MD  cyanocobalamin 100 MCG tablet Take 1,000 mcg by mouth daily.    [provider]  dextromethorphan-guaiFENesin (MUCINEX DM) 30-600 MG 12hr tablet Take 1 tablet by mouth daily as needed for cough.    [provider]  enzalutamide Diana Eves) 80 MG tablet Take 80 mg by mouth daily.    [provider]  ferrous sulfate 325 (65 FE) MG tablet Take 1 tablet (325 mg total) by mouth 2 (two) times daily with a meal. Mon, wed, fri Patient taking differently: Take 325 mg by mouth 2 (two) times daily. 08/15/22   Cleora Fleet, MD  finasteride (PROSCAR) 5 MG tablet Take 1 tablet (5 mg total) by mouth daily. 02/23/23   McKenzie, Mardene Celeste, MD  furosemide (LASIX) 20 MG tablet Take 1 tablet (20 mg total) by mouth every other day. Tuesday, Thursday and Saturday 08/15/22 01/05/23  Cleora Fleet, MD  loperamide (IMODIUM) 2 MG capsule Take 2 mg by mouth once. Once daily    [provider]  magic mouthwash (nystatin, lidocaine, diphenhydrAMINE, alum & mag hydroxide) suspension Swish and spit 5 mLs 4 (four) times daily as needed for mouth pain. 11/28/22   Pokhrel, Rebekah Chesterfield, MD  pantoprazole (PROTONIX) 40 MG tablet Take 1 tablet (40 mg total) by mouth 2 (two) times daily. Patient taking differently: Take 40 mg by mouth daily.  08/15/22 08/15/23  Johnson, Clanford L, MD  pregabalin (LYRICA) 75 MG capsule Take 1 capsule (75 mg total) by mouth 2 (two) times daily. 07/09/22   Sharee Holster, NP  topiramate (TOPAMAX) 50 MG tablet Take 0.5 tablets (25 mg total) by mouth 2 (two) times daily. Patient taking differently: Take 50 mg by mouth 2 (two) times daily. 07/09/22   Sharee Holster, NP  zolpidem (AMBIEN) 5 MG tablet Take 5-10 mg by mouth at bedtime as needed for sleep. 07/09/22   [provider]                                                                                                                                    Past Surgical History Past Surgical History:  Procedure Laterality Date   APPENDECTOMY     as child   BACK SURGERY     2000 IDET SPINAL PROC   CHOLECYSTECTOMY N/A 04/07/2016   Procedure: CHOLECYSTECTOMY;  Surgeon: Franky Macho, MD;  Location: [facility];  Service: General;  Laterality: N/A;   COLON SURGERY     FOR DIVERTICULOSIS   1988   COLONOSCOPY N/A 01/07/2018   Procedure: COLONOSCOPY;  Surgeon: Malissa Hippo, MD;  Location: [facility];  Service: Endoscopy;  Laterality: N/A;  1:00   COLONOSCOPY WITH PROPOFOL N/A 08/12/2022   Procedure: COLONOSCOPY WITH PROPOFOL;  Surgeon: Dolores Frame, MD;  Location: [facility];  Service: Gastroenterology;  Laterality: N/A;  ASA 3   CRYOTHERAPY     ESOPHAGOGASTRODUODENOSCOPY (EGD) WITH PROPOFOL N/A 08/09/2022   Procedure: ESOPHAGOGASTRODUODENOSCOPY (EGD) WITH PROPOFOL;  Surgeon: Dolores Frame, MD;  Location: [facility];  Service: Gastroenterology;  Laterality: N/A;   EYE SURGERY     right cataract with lens implant   FEMUR SURGERY     RT LEG        1953   GIVENS CAPSULE STUDY N/A 08/13/2022   Procedure: GIVENS CAPSULE STUDY;  Surgeon: Dolores Frame, MD;  Location: [facility];  Service: Gastroenterology;  Laterality: N/A;   HAND SURGERY     RIGHT   HERNIA REPAIR     1-right inguinal, 3- left inguinal  IRRIGATION AND DEBRIDEMENT KNEE Left 07/09/2015   Procedure: LEFT KNEE IRRIGATION AND DEBRIDEMENT WOUND CLOSURE;  Surgeon: Ollen Gross, MD;  Location: [facility];  Service: Orthopedics;  Laterality: Left;   JOINT REPLACEMENT  02/28/2016   lt knee revision   KNEE DEBRIDEMENT     2016   LEFT   MENISCUS REPAIR     LEFT   POLYPECTOMY  01/07/2018   Procedure: POLYPECTOMY;  Surgeon: Malissa Hippo, MD;  Location: [facility];  Service: Endoscopy;;  colon    POLYPECTOMY  08/12/2022   Procedure: POLYPECTOMY;  Surgeon: Dolores Frame, MD;  Location: [facility];  Service: Gastroenterology;;   SHOULDER OPEN ROTATOR CUFF REPAIR     RT SHOULDER   SPINAL FUSION     SPINAL FUSION     TONSILLECTOMY     TOTAL KNEE ARTHROPLASTY Left 05/28/2015   Procedure: LEFT TOTAL KNEE ARTHROPLASTY;  Surgeon: Ollen Gross, MD;  Location: [facility];  Service: Orthopedics;  Laterality: Left;   TOTAL KNEE REVISION Left 02/28/2016   Procedure: LEFT TOTAL KNEE REVISION;  Surgeon: Tarry Kos, MD;  Location: [facility];  Service: Orthopedics;  Laterality: Left;   TRIGGER FINGER RELEASE     RT HAND   1990S   UVULECTOMY  2004   TURBINATE,TONSIL,ADENOID   Family History Family History  Problem Relation Age of Onset   Colon cancer Sister    Hypertension Other    Depression Brother    Depression Daughter     Social History Social History   Tobacco Use   Smoking status: Former    Packs/day: 0.10    Years: 18.00    Additional pack years: 0.00    Total pack years: 1.80    Types: Cigars, Cigarettes    Start date: 12/18/1958    Quit date: 12/01/1986    Years since quitting: 36.3   Smokeless tobacco: Never   Tobacco comments:    Smoke cigars daily per daughter.  Vaping Use   Vaping Use: Never used  Substance Use Topics   Alcohol use: No    Alcohol/week: 0.0 standard drinks of alcohol   Drug use: No   Allergies Iodine, Calan [verapamil], Cymbalta [duloxetine hcl], Iodinated contrast media, Latex,  Lisinopril, Neurontin [gabapentin], Zoloft [sertraline hcl], Valium [diazepam], Flomax [tamsulosin hcl], Glimepiride, Melatonin, and Tape  Review of Systems Review of Systems  Unable to perform ROS: Dementia    Physical Exam Vital Signs  I have reviewed the triage vital signs BP 127/61   Pulse (!) 51   Temp 98.1 F (36.7 C) (Oral)   Resp (!) 24   Ht 6\' 1"  (1.854 m)   Wt 71.7 kg   SpO2 98%   BMI 20.85 kg/m  Physical Exam Vitals and nursing note reviewed.  Constitutional:      General: He is not in acute distress.    Appearance: Normal appearance.     Comments: Frequent cough   HENT:     Mouth/Throat:     Mouth: Mucous membranes are moist.  Eyes:     Conjunctiva/sclera: Conjunctivae normal.  Cardiovascular:     Rate and Rhythm: Normal rate and regular rhythm.  Pulmonary:     Effort: Pulmonary effort is normal. No respiratory distress.     Breath sounds: Normal breath sounds.  Abdominal:     General: Abdomen is flat.     Palpations: Abdomen is soft.     Tenderness: There is abdominal tenderness (mild, suprapubic).  Musculoskeletal:  Right lower leg: No edema.     Left lower leg: No edema.  Skin:    General: Skin is warm and dry.     Capillary Refill: Capillary refill takes less than 2 seconds.  Neurological:     Mental Status: He is alert.     Comments: Oriented to self, answers some basic questions, not oriented to situation or year  Psychiatric:        Mood and Affect: Mood normal.        Behavior: Behavior normal.     ED Results and Treatments Labs (all labs ordered are listed, but only abnormal results are displayed) Labs Reviewed  URINALYSIS, W/ REFLEX TO CULTURE (INFECTION SUSPECTED) - Abnormal; Notable for the following components:      Result Value   Color, Urine AMBER (*)    APPearance HAZY (*)    Protein, ur 100 (*)    Nitrite POSITIVE (*)    Leukocytes,Ua LARGE (*)    Bacteria, UA MANY (*)    All other components within normal limits   CBC WITH DIFFERENTIAL/PLATELET - Abnormal; Notable for the following components:   RBC 3.65 (*)    Hemoglobin 10.6 (*)    HCT 34.2 (*)    RDW 15.6 (*)    All other components within normal limits  COMPREHENSIVE METABOLIC PANEL - Abnormal; Notable for the following components:   Glucose, Bld 104 (*)    Albumin 2.8 (*)    AST 13 (*)    All other components within normal limits  CULTURE, BLOOD (ROUTINE X 2)  CULTURE, BLOOD (ROUTINE X 2)  URINE CULTURE  MAGNESIUM  TSH  AMMONIA  COMPREHENSIVE METABOLIC PANEL  MAGNESIUM  CBC WITH DIFFERENTIAL/PLATELET                                                                                                                          Radiology DG Chest Port 1 View  Result Date: 03/25/2023 CLINICAL DATA:  Cough, question UTI EXAM: PORTABLE CHEST 1 VIEW COMPARISON:  Portable exam 1727 hours compared to 12/31/2022 FINDINGS: Normal heart size, mediastinal contours, and pulmonary vascularity. Atherosclerotic calcification aorta. Peribronchial thickening with slight increase in LEFT infrahilar markings versus previous exam, question atelectasis versus infiltrate. Remaining lungs clear. No pleural effusion or pneumothorax. Bones demineralized. IMPRESSION: Bronchitic changes with question mild atelectasis versus infiltrate at LEFT base. Aortic Atherosclerosis (ICD10-I70.0). Electronically Signed   By: Ulyses Southward M.D.   On: 03/25/2023 17:51    Pertinent labs & imaging results that were available during my care of the patient were reviewed by me and considered in my medical decision making (see MDM for details).  Medications Ordered in ED Medications  cefTRIAXone (ROCEPHIN) 1 g in sodium chloride 0.9 % 100 mL IVPB (has no administration in time range)  atorvastatin (LIPITOR) tablet 40 mg (has no administration in time range)  pantoprazole (PROTONIX) EC tablet 40 mg (has no administration in time range)  alfuzosin (UROXATRAL) 24 hr  tablet 10 mg (has no  administration in time range)  finasteride (PROSCAR) tablet 5 mg (has no administration in time range)  pregabalin (LYRICA) capsule 75 mg (75 mg Oral Given 03/25/23 2251)  topiramate (TOPAMAX) tablet 50 mg (50 mg Oral Given 03/25/23 2246)  heparin injection 5,000 Units (5,000 Units Subcutaneous Given 03/25/23 2248)  0.9 %  sodium chloride infusion ( Intravenous New Bag/Given 03/25/23 2246)  acetaminophen (TYLENOL) tablet 650 mg (has no administration in time range)    Or  acetaminophen (TYLENOL) suppository 650 mg (has no administration in time range)  oxyCODONE (Oxy IR/ROXICODONE) immediate release tablet 5 mg (has no administration in time range)  ondansetron (ZOFRAN) tablet 4 mg (has no administration in time range)    Or  ondansetron (ZOFRAN) injection 4 mg (has no administration in time range)  lactated ringers bolus 1,000 mL (0 mLs Intravenous Stopped 03/25/23 1956)  cefTRIAXone (ROCEPHIN) 1 g in sodium chloride 0.9 % 100 mL IVPB (0 g Intravenous Stopped 03/25/23 2223)                                                                                                                                     Procedures Procedures  (including critical care time)  Medical Decision Making / ED Course   MDM:  89 year old male presenting to the emergency department with weakness.  Patient overall well-appearing, no fevers.  History limited due to underlying dementia.  Will obtain workup to evaluate for infectious cause such as pneumonia given cough, although lungs are clear, or urinary infection.  Foley catheter exchanged.  Labs without evidence of toxic or metabolic abnormality which would cause weakness.  No falls or head trauma doubt intracranial process. Will re-assess.  Clinical Course as of 03/25/23 2252  Wed Mar 25, 2023  2251 Urinalysis concerning for infection. CXR shows possible pneumonia as well. Given CTX. Will admit for likely delirium and UTI [WS]    Clinical Course User  Index [WS] Lonell Grandchild, MD     Additional history obtained: -Additional history obtained from family -External records from outside source obtained and reviewed including: Chart review including previous notes, labs, imaging, consultation notes including prior hospitalizations   Lab Tests: -I ordered, reviewed, and interpreted labs.   The pertinent results include:   Labs Reviewed  URINALYSIS, W/ REFLEX TO CULTURE (INFECTION SUSPECTED) - Abnormal; Notable for the following components:      Result Value   Color, Urine AMBER (*)    APPearance HAZY (*)    Protein, ur 100 (*)    Nitrite POSITIVE (*)    Leukocytes,Ua LARGE (*)    Bacteria, UA MANY (*)    All other components within normal limits  CBC WITH DIFFERENTIAL/PLATELET - Abnormal; Notable for the following components:   RBC 3.65 (*)    Hemoglobin 10.6 (*)    HCT 34.2 (*)    RDW 15.6 (*)    All  other components within normal limits  COMPREHENSIVE METABOLIC PANEL - Abnormal; Notable for the following components:   Glucose, Bld 104 (*)    Albumin 2.8 (*)    AST 13 (*)    All other components within normal limits  CULTURE, BLOOD (ROUTINE X 2)  CULTURE, BLOOD (ROUTINE X 2)  URINE CULTURE  MAGNESIUM  TSH  AMMONIA  COMPREHENSIVE METABOLIC PANEL  MAGNESIUM  CBC WITH DIFFERENTIAL/PLATELET    Notable for signs of UTI  Imaging Studies ordered: I ordered imaging studies including, CXR On my interpretation imaging demonstrates ?pna I independently visualized and interpreted imaging. I agree with the radiologist interpretation   Medicines ordered and prescription drug management: Meds ordered this encounter  Medications   lactated ringers bolus 1,000 mL   cefTRIAXone (ROCEPHIN) 1 g in sodium chloride 0.9 % 100 mL IVPB    Order Specific Question:   Antibiotic Indication:    Answer:   UTI   cefTRIAXone (ROCEPHIN) 1 g in sodium chloride 0.9 % 100 mL IVPB    Order Specific Question:   Antibiotic Indication:     Answer:   UTI   atorvastatin (LIPITOR) tablet 40 mg   pantoprazole (PROTONIX) EC tablet 40 mg   alfuzosin (UROXATRAL) 24 hr tablet 10 mg   finasteride (PROSCAR) tablet 5 mg   pregabalin (LYRICA) capsule 75 mg   topiramate (TOPAMAX) tablet 50 mg   heparin injection 5,000 Units   0.9 %  sodium chloride infusion   OR Linked Order Group    acetaminophen (TYLENOL) tablet 650 mg    acetaminophen (TYLENOL) suppository 650 mg   oxyCODONE (Oxy IR/ROXICODONE) immediate release tablet 5 mg   OR Linked Order Group    ondansetron (ZOFRAN) tablet 4 mg    ondansetron (ZOFRAN) injection 4 mg    -I have reviewed the patients home medicines and have made adjustments as needed   Consultations Obtained: I requested consultation with the hospitalist,  and discussed lab and imaging findings as well as pertinent plan - they recommend: admission   Cardiac Monitoring: The patient was maintained on a cardiac monitor.  I personally viewed and interpreted the cardiac monitored which showed an underlying rhythm of: NSR  Reevaluation: After the interventions noted above, I reevaluated the patient and found that their symptoms have improved  Co morbidities that complicate the patient evaluation  Past Medical History:  Diagnosis Date   Acute cholecystitis 04/05/2016   Anemia, iron deficiency    Chronic headaches    Migraines   Chronic kidney disease    Resolved   Depression    Diverticulitis    Essential hypertension    GI bleed    Dr. Karilyn Cota - 1998   Hypercholesteremia    Orthostatic hypotension    Peptic ulcer disease    Peripheral neuropathy    Polymyalgia rheumatica    Prostate cancer    Rectal bleeding 11/26/2017   Added automatically from request for surgery [id number]   Stroke    Residual trouble reading and writing   Syncope    Thyroid nodule    TIA (transient ischemic attack) 12/2017   Type 2 diabetes mellitus    Upper GI bleed 06/18/2012      Dispostion: Disposition decision  including need for hospitalization was considered, and patient admitted to the hospital.    Final Clinical Impression(s) / ED Diagnoses Final diagnoses:  Urinary tract infection without hematuria, site unspecified  Delirium     This chart was dictated using voice recognition software.  Despite best efforts to proofread,  errors can occur which can change the documentation meaning.    Lonell Grandchild, MD 03/25/23 2252

## 2023-03-25 NOTE — ED Notes (Signed)
Assumed care of patient. Introduced myself to patient. IV is no longer in place. Patient stated he did not want another IV at this time. Family was not at bedside. MD was made aware.

## 2023-03-25 NOTE — ED Notes (Signed)
Patient is now willing to have an IV.

## 2023-03-26 DIAGNOSIS — I10 Essential (primary) hypertension: Secondary | ICD-10-CM

## 2023-03-26 DIAGNOSIS — R338 Other retention of urine: Secondary | ICD-10-CM | POA: Diagnosis not present

## 2023-03-26 DIAGNOSIS — N401 Enlarged prostate with lower urinary tract symptoms: Secondary | ICD-10-CM

## 2023-03-26 DIAGNOSIS — E7800 Pure hypercholesterolemia, unspecified: Secondary | ICD-10-CM

## 2023-03-26 DIAGNOSIS — N138 Other obstructive and reflux uropathy: Secondary | ICD-10-CM

## 2023-03-26 DIAGNOSIS — G9341 Metabolic encephalopathy: Secondary | ICD-10-CM | POA: Diagnosis not present

## 2023-03-26 DIAGNOSIS — E1165 Type 2 diabetes mellitus with hyperglycemia: Secondary | ICD-10-CM

## 2023-03-26 DIAGNOSIS — N3000 Acute cystitis without hematuria: Secondary | ICD-10-CM

## 2023-03-26 LAB — CBC WITH DIFFERENTIAL/PLATELET
Abs Immature Granulocytes: 0.08 10*3/uL — ABNORMAL HIGH (ref 0.00–0.07)
Basophils Absolute: 0 10*3/uL (ref 0.0–0.1)
Basophils Relative: 0 %
Eosinophils Absolute: 0.3 10*3/uL (ref 0.0–0.5)
Eosinophils Relative: 4 %
HCT: 28 % — ABNORMAL LOW (ref 39.0–52.0)
Hemoglobin: 8.7 g/dL — ABNORMAL LOW (ref 13.0–17.0)
Immature Granulocytes: 1 %
Lymphocytes Relative: 8 %
Lymphs Abs: 0.6 10*3/uL — ABNORMAL LOW (ref 0.7–4.0)
MCH: 29.1 pg (ref 26.0–34.0)
MCHC: 31.1 g/dL (ref 30.0–36.0)
MCV: 93.6 fL (ref 80.0–100.0)
Monocytes Absolute: 0.7 10*3/uL (ref 0.1–1.0)
Monocytes Relative: 9 %
Neutro Abs: 5.9 10*3/uL (ref 1.7–7.7)
Neutrophils Relative %: 78 %
Platelets: 192 10*3/uL (ref 150–400)
RBC: 2.99 MIL/uL — ABNORMAL LOW (ref 4.22–5.81)
RDW: 15.4 % (ref 11.5–15.5)
WBC: 7.5 10*3/uL (ref 4.0–10.5)
nRBC: 0 % (ref 0.0–0.2)

## 2023-03-26 LAB — COMPREHENSIVE METABOLIC PANEL
ALT: 8 U/L (ref 0–44)
AST: 11 U/L — ABNORMAL LOW (ref 15–41)
Albumin: 2.3 g/dL — ABNORMAL LOW (ref 3.5–5.0)
Alkaline Phosphatase: 41 U/L (ref 38–126)
Anion gap: 7 (ref 5–15)
BUN: 17 mg/dL (ref 8–23)
CO2: 25 mmol/L (ref 22–32)
Calcium: 8.8 mg/dL — ABNORMAL LOW (ref 8.9–10.3)
Chloride: 105 mmol/L (ref 98–111)
Creatinine, Ser: 0.55 mg/dL — ABNORMAL LOW (ref 0.61–1.24)
GFR, Estimated: >60 mL/min (ref >60)
Glucose, Bld: 96 mg/dL (ref 70–99)
Potassium: 3.3 mmol/L — ABNORMAL LOW (ref 3.5–5.1)
Sodium: 137 mmol/L (ref 135–145)
Total Bilirubin: 0.4 mg/dL (ref 0.3–1.2)
Total Protein: 5.8 g/dL — ABNORMAL LOW (ref 6.5–8.1)

## 2023-03-26 LAB — MAGNESIUM: Magnesium: 1.7 mg/dL (ref 1.7–2.4)

## 2023-03-26 MED ORDER — PIPERACILLIN-TAZOBACTAM 3.375 G IVPB 30 MIN: 3.3750 g | Freq: Three times a day (TID) | INTRAVENOUS | Status: DC

## 2023-03-26 MED ORDER — PIPERACILLIN-TAZOBACTAM 3.375 G IVPB
3.3750 g | Freq: Three times a day (TID) | INTRAVENOUS | Status: DC
  Administered 2023-03-26 – 2023-03-28 (×8): 3.375 g via INTRAVENOUS
  Filled 2023-03-26 (×8): qty 50

## 2023-03-26 MED ORDER — CHLORHEXIDINE GLUCONATE CLOTH 2 % EX PADS
6.0000 | MEDICATED_PAD | Freq: Every day | CUTANEOUS | Status: DC
  Administered 2023-03-26 – 2023-03-29 (×4): 6 via TOPICAL

## 2023-03-26 NOTE — ED Notes (Signed)
Walked in to see patient. Patient is resting in bed.  Patient declined being readjusted in bed and stated he was comfortable as it.

## 2023-03-26 NOTE — Progress Notes (Signed)
Patient seen and examined; admitted after midnight secondary to altered mental status in the setting of UTI.  Patient with chronic Foley catheter due to urinary retention increasing risk for urinary tract infection.  No chest pain, no nausea, no vomiting; hemodynamically stable.  Please refer to H&P written by Dr.Zierle-Ghosh for further info/details on admission.  Plan: -Continue current IV antibiotics -Maintain adequate hydration -Follow culture results -Continue constant reorientation and minimize sedative agents. -Follow clinical response.  Vassie Loll MD [phone]

## 2023-03-26 NOTE — TOC Initial Note (Signed)
Transition of Care [facility]) - Initial/Assessment Note    Patient Details  Name: Jared Bridges MRN: [id number] Date of Birth: Sep 28, 1930  Transition of Care [facility]) CM/SW Contact:    [name], LCSW Phone Number: 03/26/2023, 10:53 AM  Clinical Narrative:                 Patient from home where daughter and is caretaker to patient. Spouse is also in the home. Patient uses a motorized chair and electric lift. He was in [place], [place], at a spine rehab center from 4/3-19. He is active with [facility] (RN, PT, aide). Considered high risk for readmission. [facility] notification completed:  Your notification ID is: [id number]  Expected Discharge Plan: Home w Home Health Services Barriers to Discharge: Continued Medical Work up   Patient Goals and CMS Choice Patient states their goals for this hospitalization and ongoing recovery are:: return home          Expected Discharge Plan and Services     Post Acute Care Choice: Durable Medical Equipment (motorized chair, electric lift) Living arrangements for the past 2 months: Single Family Home                                      Prior Living Arrangements/Services Living arrangements for the past 2 months: Single Family Home Lives with:: Adult Children Patient language and need for interpreter reviewed:: Yes        Need for Family Participation in Patient Care: Yes (Comment) Care giver support system in place?: Yes (comment) Current home services: DME, Home OT, Home PT, Home RN Criminal Activity/Legal Involvement Pertinent to Current Situation/Hospitalization: No - Comment as needed  Activities of Daily Living      Permission Sought/Granted Permission sought to share information with : Family Supports    Share Information with NAME: daughter, Amante Fomby           Emotional Assessment         Alcohol / Substance Use: Not Applicable Psych Involvement: No (comment)  Admission diagnosis:  Delirium  [R41.0] Urinary tract infection without hematuria, site unspecified [N39.0] Acute metabolic encephalopathy [G93.41] Patient Active Problem List   Diagnosis Date Noted   Acute metabolic encephalopathy 03/25/2023   History of Clostridioides difficile colitis 01/05/2023   Acute respiratory failure with hypoxia 12/31/2022   Pneumonia 12/31/2022   Generalized weakness 11/27/2022   C. difficile diarrhea 11/26/2022   Acute urinary retention 11/26/2022   Decreased appetite 11/22/2022   Atelectasis 08/10/2022   Iron deficiency anemia secondary to blood loss (chronic) 08/08/2022   Grade I diastolic dysfunction 07/01/2022   Hyperlipidemia associated with type 2 diabetes mellitus 06/26/2022   Hypertension associated with type 2 diabetes mellitus 06/26/2022   History of CVA (cerebrovascular accident) 06/26/2022   Vascular dementia without behavioral disturbance 06/26/2022   Neurodegenerative cognitive impairment 06/26/2022   Normochromic normocytic anemia    Mixed hyperlipidemia 06/16/2022   Chronic diastolic CHF (congestive heart failure) 06/16/2022   GERD (gastroesophageal reflux disease) 06/16/2022   Loss of weight 03/10/2022   History of peptic ulcer disease 09/03/2021   Wheelchair dependence 09/02/2021   Quadriplegia 09/02/2021   Sleep disturbance    Controlled type 2 diabetes mellitus with hyperglycemia, without long-term current use of insulin    Central cord syndrome at C7 level of cervical spinal cord 07/05/2021   Epidural hematoma 07/02/2021   Lower urinary tract symptoms (LUTS)  10/31/2020   Prostate cancer 06/20/2020   Elevated PSA 04/18/2020   Cobalamin deficiency 02/22/2020   Intractable chronic migraine without aura 02/20/2020   Altered mental status 02/20/2020   Asthenia 02/20/2020   Diabetic peripheral neuropathy 02/20/2020   Obstructive sleep apnea of adult 02/20/2020   Medicare annual wellness visit, subsequent 12/07/2019   Depressed mood 12/07/2019   Vitamin D  deficiency 10/25/2019   Memory loss 10/25/2019   Fall 10/25/2019   Nonrheumatic aortic valve stenosis 03/23/2018   Migraine headaches 12/29/2017   TIA (transient ischemic attack) 12/28/2017   Carpal tunnel syndrome, left 07/22/2017   Acquired trigger finger 07/22/2017   Aseptic loosening of prosthetic knee 02/28/2016   Total knee replacement status 02/28/2016   CVA (cerebral infarction) 12/19/2015   Carotid stenosis 12/19/2015   Wound infection after surgery 06/29/2015   OA (osteoarthritis) of knee 05/28/2015   Orthostatic hypotension 11/21/2014   Dizziness 11/16/2014   Polymyalgia rheumatica 09/12/2014   UTI (urinary tract infection) 08/16/2014   Dehydration 08/16/2014   Iron deficiency anemia due to chronic blood loss 08/16/2014   DJD (degenerative joint disease) 08/16/2014   Essential hypertension    Cataract, nuclear 04/11/2014   Complaining of back-related symptom 03/15/2014   Arthralgia of hip or thigh 03/15/2014   Gonalgia 10/10/2013   Left knee pain 10/10/2013   1st degree AV block 08/04/2013   Infection of the upper respiratory tract 04/05/2013   Cataract 02/24/2013   Pseudoaphakia 02/24/2013   Episode of syncope 01/11/2013   Cardiac murmur 11/09/2012   Near syncope 11/09/2012   Anxiety 06/18/2012   Depression, recurrent 06/18/2012   Difficulty hearing 06/18/2012   Adaptive colitis 06/18/2012   Arthritis, degenerative 06/18/2012   Hearing impairment 06/18/2012   Multinodular goiter 01/29/2012   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   Adenoma of large intestine 05/31/2011   Cephalalgia 05/31/2011   Hypercholesterolemia 05/31/2011   Benign prostatic hyperplasia with urinary obstruction 05/31/2011   LBP (low back pain) 05/31/2011   Essential (primary) hypertension 05/31/2011   Headache(784.0) 05/31/2011   Hyperplasia of prostate 05/31/2011   PCP:  Dorothey Baseman, MD Pharmacy:   [facility] - [place], [place] - [address] [place]  [address] Phone: [phone] Fax: [phone]  [facility] - [place], [place] - [address] [place] [address] Phone: [phone] Fax: [phone]     Social Determinants of Health (SDOH) Social History: SDOH Screenings   Food Insecurity: No Food Insecurity (01/01/2023)  Housing: Low Risk  (01/01/2023)  Transportation Needs: No Transportation Needs (01/01/2023)  Utilities: Not At Risk (01/01/2023)  Recent Concern: Utilities - At Risk (11/27/2022)  Depression (PHQ2-9): Low Risk  (01/03/2021)  Tobacco Use: Medium Risk (03/25/2023)   SDOH Interventions:     Readmission Risk Interventions    01/02/2023   12:22 PM 06/23/2022    1:26 PM  Readmission Risk Prevention Plan  Transportation Screening Complete Complete  HRI or Home Care Consult  Complete  Social Work Consult for Recovery Care Planning/Counseling  Complete  Palliative Care Screening  Not Applicable  Medication Review Oceanographer) Complete Complete  HRI or Home Care Consult Complete   SW Recovery Care/Counseling Consult Complete   Palliative Care Screening Not Applicable   Skilled Nursing Facility Not Applicable

## 2023-03-26 NOTE — Assessment & Plan Note (Addendum)
-  Associated with chronic Foley catheter -More than 100,000 colonies of Klebsiella pneumoniae appreciated -Following culture results/sensitivity antibiotics will be transition to Augmentin. -Continue supportive care and clinical response. -Maintain adequate hydration  -Recommending patient's Foley catheter to be changed every 3-4 weeks to minimize recurrent infection.

## 2023-03-26 NOTE — Assessment & Plan Note (Addendum)
-  Stable  -Continue to follow vital signs fluctuation -Maintain adequate hydration. -Continue current use of alpha blockers as previously prescribed.

## 2023-03-26 NOTE — Assessment & Plan Note (Addendum)
-  Stable and well-controlled -Patient following diet management for the most part -Continue to follow CBGs fluctuation.

## 2023-03-26 NOTE — ED Notes (Signed)
Patient is resting in bed.

## 2023-03-26 NOTE — H&P (Signed)
History and Physical    Patient: Jared Bridges WUJ:[id number] DOB: Apr 14, 1930 DOA: 03/25/2023 DOS: the patient was seen and examined on 03/26/2023 PCP: Dorothey Baseman, MD  Patient coming from: Home  Chief Complaint: No chief complaint on file.  HPI: Jared Bridges is a 89 y.o. male with medical history significant of iron deficiency anemia, diverticulitis, hypertension, hyperlipidemia, peptic ulcer disease, polymyalgia rheumatica, thyroid disease, TIA, type 2 diabetes mellitus, and upper GI bleed presents ED with a chief complaint of altered mental status.  Family reports that he had recently been admitted to an outside facility for veterans with spinal cord injuries.  He has not been acting his normal self since that admission.  Today they brought him into the ER because he noticed purulent drainage in his Foley and around his Foley.  He had Also been more fatigued. Unfortunately, patient is currently non-verbal and not following commands. He is not able to provide any more history at this time.   There are no active ACP documents.  Review of Systems: unable to review all systems due to the inability of the patient to answer questions. Past Medical History:  Diagnosis Date   Acute cholecystitis 04/05/2016   Anemia, iron deficiency    Chronic headaches    Migraines   Chronic kidney disease    Resolved   Depression    Diverticulitis    Essential hypertension    GI bleed    Dr. Karilyn Cota - 1998   Hypercholesteremia    Orthostatic hypotension    Peptic ulcer disease    Peripheral neuropathy    Polymyalgia rheumatica    Prostate cancer    Rectal bleeding 11/26/2017   Added automatically from request for surgery [id number]   Stroke    Residual trouble reading and writing   Syncope    Thyroid nodule    TIA (transient ischemic attack) 12/2017   Type 2 diabetes mellitus    Upper GI bleed 06/18/2012   Past Surgical History:  Procedure Laterality Date   APPENDECTOMY     as child    BACK SURGERY     2000 IDET SPINAL PROC   CHOLECYSTECTOMY N/A 04/07/2016   Procedure: CHOLECYSTECTOMY;  Surgeon: Franky Macho, MD;  Location: [facility];  Service: General;  Laterality: N/A;   COLON SURGERY     FOR DIVERTICULOSIS   1988   COLONOSCOPY N/A 01/07/2018   Procedure: COLONOSCOPY;  Surgeon: Malissa Hippo, MD;  Location: [facility];  Service: Endoscopy;  Laterality: N/A;  1:00   COLONOSCOPY WITH PROPOFOL N/A 08/12/2022   Procedure: COLONOSCOPY WITH PROPOFOL;  Surgeon: Dolores Frame, MD;  Location: [facility];  Service: Gastroenterology;  Laterality: N/A;  ASA 3   CRYOTHERAPY     ESOPHAGOGASTRODUODENOSCOPY (EGD) WITH PROPOFOL N/A 08/09/2022   Procedure: ESOPHAGOGASTRODUODENOSCOPY (EGD) WITH PROPOFOL;  Surgeon: Dolores Frame, MD;  Location: [facility];  Service: Gastroenterology;  Laterality: N/A;   EYE SURGERY     right cataract with lens implant   FEMUR SURGERY     RT LEG        1953   GIVENS CAPSULE STUDY N/A 08/13/2022   Procedure: GIVENS CAPSULE STUDY;  Surgeon: Dolores Frame, MD;  Location: [facility];  Service: Gastroenterology;  Laterality: N/A;   HAND SURGERY     RIGHT   HERNIA REPAIR     1-right inguinal, 3- left inguinal   IRRIGATION AND DEBRIDEMENT KNEE Left 07/09/2015   Procedure: LEFT KNEE IRRIGATION AND  DEBRIDEMENT WOUND CLOSURE;  Surgeon: Ollen Gross, MD;  Location: [facility];  Service: Orthopedics;  Laterality: Left;   JOINT REPLACEMENT  02/28/2016   lt knee revision   KNEE DEBRIDEMENT     2016   LEFT   MENISCUS REPAIR     LEFT   POLYPECTOMY  01/07/2018   Procedure: POLYPECTOMY;  Surgeon: Malissa Hippo, MD;  Location: [facility];  Service: Endoscopy;;  colon    POLYPECTOMY  08/12/2022   Procedure: POLYPECTOMY;  Surgeon: Dolores Frame, MD;  Location: [facility];  Service: Gastroenterology;;   SHOULDER OPEN ROTATOR CUFF REPAIR     RT SHOULDER   SPINAL FUSION     SPINAL FUSION     TONSILLECTOMY      TOTAL KNEE ARTHROPLASTY Left 05/28/2015   Procedure: LEFT TOTAL KNEE ARTHROPLASTY;  Surgeon: Ollen Gross, MD;  Location: [facility];  Service: Orthopedics;  Laterality: Left;   TOTAL KNEE REVISION Left 02/28/2016   Procedure: LEFT TOTAL KNEE REVISION;  Surgeon: Tarry Kos, MD;  Location: [facility];  Service: Orthopedics;  Laterality: Left;   TRIGGER FINGER RELEASE     RT HAND   1990S   UVULECTOMY  2004   TURBINATE,TONSIL,ADENOID   Social History:  reports that he quit smoking about 36 years ago. His smoking use included cigars and cigarettes. He started smoking about 64 years ago. He has a 1.80 pack-year smoking history. He has never used smokeless tobacco. He reports that he does not drink alcohol and does not use drugs.  Allergies  Allergen Reactions   Iodine Hives, Itching and Other (See Comments)    very allergic(per daughter), can pre-med with benadryl   Calan [Verapamil] Other (See Comments)    Weakness   Cymbalta [Duloxetine Hcl] Other (See Comments)    Makes patient have jerking motions.   Iodinated Contrast Media Hives, Itching and Other (See Comments)    Can pre-med with benadryl   Latex Other (See Comments)    Redness iritation   Lisinopril Other (See Comments)    Makes patient have jerking motions.   Neurontin [Gabapentin] Other (See Comments)    Makes patient have jerking motions.   Zoloft [Sertraline Hcl] Diarrhea and Other (See Comments)    Makes patient have jerking motions.   Valium [Diazepam] Other (See Comments)    "Did not work"   Flomax [Tamsulosin Hcl] Other (See Comments)    Dizziness   Glimepiride Other (See Comments)    "Did not work"   Melatonin Nausea Only   Tape Other (See Comments)    Skin irritation    Family History  Problem Relation Age of Onset   Colon cancer Sister    Hypertension Other    Depression Brother    Depression Daughter     Prior to Admission medications   Medication Sig Start Date End Date Taking? Authorizing Provider   albuterol (VENTOLIN HFA) 108 (90 Base) MCG/ACT inhaler Inhale 2 puffs into the lungs every 6 (six) hours as needed for wheezing or shortness of breath. 01/03/23   Briant Cedar, MD  alfuzosin (UROXATRAL) 10 MG 24 hr tablet Take 1 tablet (10 mg total) by mouth daily with breakfast. 02/23/23   McKenzie, Mardene Celeste, MD  Apremilast (OTEZLA) 30 MG TABS Take 30 mg by mouth 2 (two) times daily.    [provider]  atorvastatin (LIPITOR) 40 MG tablet Take 1 tablet (40 mg total) by mouth daily. 07/09/22   Sharee Holster, NP  Calcium 200 MG TABS Take 400 mg by mouth in the morning and at bedtime.    [provider]  cherry syrup syrup Take by mouth. Patient not taking: Reported on 01/05/2023 11/28/22   [provider]  Cholecalciferol (VITAMIN D3) 1000 units CAPS Take 1,000 Units by mouth daily.    [provider]  clotrimazole-betamethasone (LOTRISONE) cream Apply 1 Application topically 2 (two) times daily. Apply to the infected area twice a day 01/12/23   Malen Gauze, MD  cyanocobalamin 100 MCG tablet Take 1,000 mcg by mouth daily.    [provider]  dextromethorphan-guaiFENesin (MUCINEX DM) 30-600 MG 12hr tablet Take 1 tablet by mouth daily as needed for cough.    [provider]  enzalutamide Diana Eves) 80 MG tablet Take 80 mg by mouth daily.    [provider]  ferrous sulfate 325 (65 FE) MG tablet Take 1 tablet (325 mg total) by mouth 2 (two) times daily with a meal. Mon, wed, fri Patient taking differently: Take 325 mg by mouth 2 (two) times daily. 08/15/22   Johnson, Clanford L, MD  finasteride (PROSCAR) 5 MG tablet Take 1 tablet (5 mg total) by mouth daily. 02/23/23   McKenzie, Mardene Celeste, MD  furosemide (LASIX) 20 MG tablet Take 1 tablet (20 mg total) by mouth every other day. Tuesday, Thursday and Saturday 08/15/22 01/05/23  Cleora Fleet, MD  loperamide (IMODIUM) 2 MG capsule Take 2 mg by mouth once. Once daily    [provider]  magic mouthwash (nystatin, lidocaine, diphenhydrAMINE, alum & mag hydroxide) suspension Swish and spit 5 mLs 4 (four) times daily as needed for mouth pain. 11/28/22   Pokhrel, Rebekah Chesterfield, MD  pantoprazole (PROTONIX) 40 MG tablet Take 1 tablet (40 mg total) by mouth 2 (two) times daily. Patient taking differently: Take 40 mg by mouth daily. 08/15/22 08/15/23  Johnson, Clanford L, MD  pregabalin (LYRICA) 75 MG capsule Take 1 capsule (75 mg total) by mouth 2 (two) times daily. 07/09/22   Sharee Holster, NP  topiramate (TOPAMAX) 50 MG tablet Take 0.5 tablets (25 mg total) by mouth 2 (two) times daily. Patient taking differently: Take 50 mg by mouth 2 (two) times daily. 07/09/22   Sharee Holster, NP  zolpidem (AMBIEN) 5 MG tablet Take 5-10 mg by mouth at bedtime as needed for sleep. 07/09/22   [provider]    Physical Exam: Vitals:   03/25/23 2330 03/26/23 0130 03/26/23 0200 03/26/23 0211  BP: (!) 109/51 (!) 107/56 (!) 105/51   Pulse: 90 89 82   Resp: 20 (!) 25 (!) 21   Temp:    98.1 F (36.7 C)  TempSrc:    Oral  SpO2: 94% 96% 97%   Weight:      Height:       1.  General: Patient lying supine in bed,  no acute distress   2. Psychiatric: Somnolent, non verbal, not following commands   3. Neurologic: Somnolent, non verbal, not following commands   4. HEENMT:  Head is atraumatic, normocephalic, pupils reactive to light, neck is supple, trachea is midline, mucous membranes are moist   5. Respiratory : Lungs are clear to auscultation bilaterally without wheezing, rhonchi, rales, no cyanosis, no increase in work of breathing or accessory muscle use   6. Cardiovascular : Heart rate normal, rhythm is regular, murmur present, rubs or gallops, no peripheral edema, peripheral pulses palpated   7. Gastrointestinal:  Abdomen is soft, nondistended, nontender to  palpation bowel sounds active, no masses or organomegaly palpated   8. Skin:  Skin is warm, dry and intact  without rashes, acute lesions, or ulcers on limited exam   9.Musculoskeletal:  Bruising on left hand, no asymmetry in tone, no peripheral edema, peripheral pulses palpated, no tenderness to palpation in the extremities  Data Reviewed: Afebrile, moments of bradycardia, tachypneic, BP stable, maintaining O2 sats on 2L [place] UA indicative of UTI Blood culture pending CXR shows - chronic changes with atelectasis vs infiltrate Rocephin given in the ED 1L NS in ED Admission requested for encephalopathy 2/2 UTI Assessment and Plan: * Acute metabolic encephalopathy - Likely related to UTI - Anticipate improvement with treatment of UTI - Continue antibiotics - Urine culture pending - Blood culture pending - Continue to monitor  Acute urinary retention - With indwelling Foley - Foley has been changed out - Continue to monitor intake and output -UTI could be exacerbating this problem as well - Continue Foley care  Controlled type 2 diabetes mellitus with hyperglycemia, without long-term current use of insulin - Glucose well-controlled at 104 - Continue to monitor  Essential (primary) hypertension - Continue alfuzosin and Proscar  Benign prostatic hyperplasia with urinary obstruction - Continue alfuzosin and Proscar  Hypercholesterolemia - Continue Lipitor  UTI (urinary tract infection) - UA indicative of UTI - Purulent drainage in Foley at presentation - Previous UTI grew Enterococcus faecalis - Broaden coverage from Rocephin to Zosyn to cover Enterococcus faecalis - Urine culture pending - Continue to monitor      Advance Care Planning:   Code Status: Full Code   Consults: none  Family Communication: no family at bedside  Severity of Illness: The appropriate patient status for this patient is INPATIENT. Inpatient status is judged to be reasonable and necessary in order to provide the required intensity of service to ensure the patient's safety. The patient's presenting  symptoms, physical exam findings, and initial radiographic and laboratory data in the context of their chronic comorbidities is felt to place them at high risk for further clinical deterioration. Furthermore, it is not anticipated that the patient will be medically stable for discharge from the hospital within 2 midnights of admission.   * I certify that at the point of admission it is my clinical judgment that the patient will require inpatient hospital care spanning beyond 2 midnights from the point of admission due to high intensity of service, high risk for further deterioration and high frequency of surveillance required.*  Author: Lilyan Gilford, DO 03/26/2023 5:28 AM  For on call review [email or web address].

## 2023-03-26 NOTE — Assessment & Plan Note (Addendum)
-  Likely related to UTI - Improved/resolved and with mentation back to baseline. -Continue maintaining adequate hydration and continue antibiotics. -Continue constant reorientation.

## 2023-03-26 NOTE — ED Notes (Signed)
Sacral dressing applied to wound on the sacrum. Old linens were removed and new linens and diaper were replaced. Patient as purulent drainage at the the of the foley catheter. Patient was also fed applesauce and given something to drink. Pillow was placed under the patients feet to float his heals.

## 2023-03-26 NOTE — Assessment & Plan Note (Signed)
-  Continue Lipitor °

## 2023-03-26 NOTE — Assessment & Plan Note (Addendum)
-   Continue alfuzosin and Proscar -Continue patient follow-up with urology service.

## 2023-03-26 NOTE — Assessment & Plan Note (Addendum)
-   With chronic indwelling Foley - Foley has been changed out at time of admission - Continue Foley care, complete antibiotic therapy as prescribed. -Continue the use of Proscar and alfuzosin. -Continue outpatient follow-up with urology service as discussed.

## 2023-03-27 DIAGNOSIS — G9341 Metabolic encephalopathy: Secondary | ICD-10-CM | POA: Diagnosis not present

## 2023-03-27 DIAGNOSIS — N390 Urinary tract infection, site not specified: Secondary | ICD-10-CM | POA: Diagnosis not present

## 2023-03-27 DIAGNOSIS — I10 Essential (primary) hypertension: Secondary | ICD-10-CM | POA: Diagnosis not present

## 2023-03-27 DIAGNOSIS — E1165 Type 2 diabetes mellitus with hyperglycemia: Secondary | ICD-10-CM | POA: Diagnosis not present

## 2023-03-27 MED ORDER — SODIUM CHLORIDE 0.9 % IV BOLUS
1000.0000 mL | Freq: Once | INTRAVENOUS | Status: AC
  Administered 2023-03-27: 1000 mL via INTRAVENOUS

## 2023-03-27 NOTE — Progress Notes (Signed)
Progress Note   Patient: Jared Bridges ZOX:[id number] DOB: 1930-04-22 DOA: 03/25/2023     2 DOS: the patient was seen and examined on 03/27/2023   Brief hospital admission course: Jared Bridges is a 89 y.o. male with medical history significant of iron deficiency anemia, diverticulitis, hypertension, hyperlipidemia, peptic ulcer disease, polymyalgia rheumatica, thyroid disease, TIA, type 2 diabetes mellitus, and upper GI bleed presents ED with a chief complaint of altered mental status.  Family reports that he had recently been admitted to an outside facility for veterans with spinal cord injuries.  He has not been acting his normal self since that admission.  Today they brought him into the ER because he noticed purulent drainage in his Foley and around his Foley.  He had Also been more fatigued. Unfortunately, patient is currently non-verbal and not following commands. He is not able to provide any more history at this time.   Assessment and Plan: * Acute metabolic encephalopathy - Likely related to UTI - Improving -Continue maintaining adequate hydration and continue antibiotics. -Constant reorientation.  Acute urinary retention - With chronic indwelling Foley - Foley has been changed out at time of admission - Continue Foley care, IV antibiotics and adequate hydration -Continue the use of Proscar and Jared Bridges. -Outpatient follow-up with urology recommended.  Controlled type 2 diabetes mellitus with hyperglycemia, without long-term current use of insulin ([name] - Stable and well-controlled -Patient following diet management for the most part -Continue to follow CBGs fluctuation.  Essential (primary) hypertension - Stable to soft at times -Continue to follow vital signs fluctuation -Maintain adequate hydration.  Benign prostatic hyperplasia with urinary obstruction - Continue Jared Bridges and Proscar -Continue patient follow-up with urology service.  Hypercholesterolemia -  Continue Lipitor  UTI (urinary tract infection) - Associated with chronic Foley catheter -More than 100,000 colonies of Klebsiella pneumoniae at appreciated in his culture; sensitivity pending -Continue current IV antibiotic -Maintain adequate hydration and follow clinical response.    Subjective:  Stable and improving; no chest pain, no nausea, no vomiting, no abdominal pain.  Patient is afebrile.  Physical Exam: Vitals:   03/27/23 0512 03/27/23 1226 03/27/23 1226 03/27/23 1436  BP: (!) 124/50 (!) 91/42 (!) 91/42 (!) 98/40  Pulse: 70 (!) 123 (!) 123 (!) 55  Resp: 14 14    Temp: (!) 97.4 F (36.3 C) 98.5 F (36.9 C) 98.5 F (36.9 C)   TempSrc: Oral  Oral   SpO2: 96%  96% 98%  Weight:      Height:       General exam: Alert, awake, oriented x 2; no fever, no nausea, no vomiting, no abdominal pain.  Family expressing patient mentation improved and close to baseline at time of examination. Respiratory system: Good saturation on room air.  No using accessory muscle. Cardiovascular system: No rubs, no gallops, no JVD on exam.  Positive murmur appreciated. Gastrointestinal system: Abdomen is nondistended, soft and nontender.  Positive bowel sounds; suprapubic catheter in place. Central nervous system: Following commands appropriately; no new focal deficits. Extremities: No cyanosis or clubbing; no edema appreciated on exam. Skin: No petechiae.  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  Present at time of admission. Psychiatry: Judgement and insight appear normal. Mood & affect appropriate.   Data Reviewed: Comprehensive metabolic panel: Sodium 137, potassium 3.3, chloride 105, bicarb 25, BUN 17, creatinine 0.55, normal LFTs and GFR more than 60. Urine culture: Preliminary results demonstrating more than 100,000 colonies Klebsiella pneumoniae it;  sensitivity pending. CBC: WBC 7.5, hemoglobin 8.7 and platelet count 192 K  Family  Communication: Daughter at bedside.  Disposition: Status is: Inpatient Remains inpatient appropriate because: Continue IV antibiotics.   Planned Discharge Destination: Home with home health services.  Time spent: 35 minutes  Author: Vassie Loll, MD 03/27/2023 7:33 PM  For on call review [email or web address].

## 2023-03-27 NOTE — Progress Notes (Signed)
Pt lethargic, slept through the night. Vitals stable. No c/o pain noted.

## 2023-03-28 DIAGNOSIS — N390 Urinary tract infection, site not specified: Secondary | ICD-10-CM | POA: Diagnosis not present

## 2023-03-28 DIAGNOSIS — I10 Essential (primary) hypertension: Secondary | ICD-10-CM | POA: Diagnosis not present

## 2023-03-28 DIAGNOSIS — G9341 Metabolic encephalopathy: Secondary | ICD-10-CM | POA: Diagnosis not present

## 2023-03-28 DIAGNOSIS — E1165 Type 2 diabetes mellitus with hyperglycemia: Secondary | ICD-10-CM | POA: Diagnosis not present

## 2023-03-28 LAB — URINE CULTURE: Culture: >=100000 — AB

## 2023-03-28 LAB — BASIC METABOLIC PANEL
Anion gap: 7 (ref 5–15)
BUN: 20 mg/dL (ref 8–23)
CO2: 24 mmol/L (ref 22–32)
Calcium: 8.6 mg/dL — ABNORMAL LOW (ref 8.9–10.3)
Chloride: 106 mmol/L (ref 98–111)
Creatinine, Ser: 0.8 mg/dL (ref 0.61–1.24)
GFR, Estimated: >60 mL/min (ref >60)
Glucose, Bld: 128 mg/dL — ABNORMAL HIGH (ref 70–99)
Potassium: 3 mmol/L — ABNORMAL LOW (ref 3.5–5.1)
Sodium: 137 mmol/L (ref 135–145)

## 2023-03-28 MED ORDER — POTASSIUM CHLORIDE 10 MEQ/100ML IV SOLN
10.0000 meq | INTRAVENOUS | Status: AC
  Administered 2023-03-28 (×4): 10 meq via INTRAVENOUS
  Filled 2023-03-28 (×4): qty 100

## 2023-03-28 MED ORDER — RAMELTEON 8 MG PO TABS
8.0000 mg | ORAL_TABLET | Freq: Every day | ORAL | Status: DC | PRN
  Administered 2023-03-28 (×2): 8 mg via ORAL
  Filled 2023-03-28 (×2): qty 1

## 2023-03-28 MED ORDER — AMOXICILLIN-POT CLAVULANATE 875-125 MG PO TABS
1.0000 | ORAL_TABLET | Freq: Two times a day (BID) | ORAL | Status: DC
  Administered 2023-03-28 – 2023-03-29 (×2): 1 via ORAL
  Filled 2023-03-28 (×2): qty 1

## 2023-03-28 NOTE — Plan of Care (Signed)
  Problem: Pain Managment: Goal: General experience of comfort will improve Outcome: Progressing   

## 2023-03-28 NOTE — Progress Notes (Signed)
Progress Note   Patient: Jared Bridges ZOX:[id number] DOB: 21-Sep-1930 DOA: 03/25/2023     3 DOS: the patient was seen and examined on 03/28/2023   Brief hospital admission course: CARMAN ESSICK is a 89 y.o. male with medical history significant of iron deficiency anemia, diverticulitis, hypertension, hyperlipidemia, peptic ulcer disease, polymyalgia rheumatica, thyroid disease, TIA, type 2 diabetes mellitus, and upper GI bleed presents ED with a chief complaint of altered mental status.  Family reports that he had recently been admitted to an outside facility for veterans with spinal cord injuries.  He has not been acting his normal self since that admission.  Today they brought him into the ER because he noticed purulent drainage in his Foley and around his Foley.  He had Also been more fatigued. Unfortunately, patient is currently non-verbal and not following commands. He is not able to provide any more history at this time.   Assessment and Plan: * Acute metabolic encephalopathy - Likely related to UTI - Improving -Continue maintaining adequate hydration and continue antibiotics. -Constant reorientation.  Acute urinary retention - With chronic indwelling Foley - Foley has been changed out at time of admission - Continue Foley care, IV antibiotics and adequate hydration -Continue the use of Proscar and alfuzosin. -Outpatient follow-up with urology recommended.  Controlled type 2 diabetes mellitus with hyperglycemia, without long-term current use of insulin ([name] - Stable and well-controlled -Patient following diet management for the most part -Continue to follow CBGs fluctuation.  Essential (primary) hypertension - Stable to soft at times -Continue to follow vital signs fluctuation -Maintain adequate hydration.  Benign prostatic hyperplasia with urinary obstruction - Continue alfuzosin and Proscar -Continue patient follow-up with urology service.  Hypercholesterolemia -  Continue Lipitor  UTI (urinary tract infection) - Associated with chronic Foley catheter -More than 100,000 colonies of Klebsiella pneumoniae appreciated -Following culture results/sensitivity antibiotics will be transition to Augmentin. -Continue supportive care and clinical response. -Maintain adequate hydration   Hypokalemia: -Replete electrolytes and follow trend.  Subjective:  No fever, no chest pain, no nausea or vomiting.  Reports feeling much better and demonstrating improvement in overall mentation.  Blood pressure slightly soft and low potassium appreciated.  Physical Exam: Vitals:   03/27/23 1436 03/27/23 2119 03/28/23 0445 03/28/23 1229  BP: (!) 98/40 (!) 102/44 (!) 93/57 (!) 92/33  Pulse: (!) 55 61 (!) 54 61  Resp:  20 20 20   Temp:  98.1 F (36.7 C) 98.8 F (37.1 C) 98 F (36.7 C)  TempSrc:  Oral  Oral  SpO2: 98% 100% 97% 99%  Weight:      Height:       General exam: Alert, awake, oriented x 2; afebrile, no chest pain, no nausea, no vomiting.  Reports feeling better. Respiratory system: Clear to auscultation. Respiratory effort normal.  Good saturation on room air. Cardiovascular system: No rubs or gallops; no JVD. Gastrointestinal system: Abdomen is nondistended, soft and nontender. No organomegaly or masses felt. Normal bowel sounds heard.  Chronic Foley catheter in place. Central nervous system: No new focal deficit. Extremities: No cyanosis or clubbing. Skin: No petechiae. Psychiatry: Judgement and insight appear normal. Mood & affect appropriate.    Data Reviewed: Basic metabolic panel: Sodium 137, potassium 3.0, chloride 106, bicarb 24, BUN 20, creatinine 0.80.  Family Communication: Daughter at bedside.  Disposition: Status is: Inpatient Remains inpatient appropriate because: Continue IV antibiotics.   Planned Discharge Destination: Home with home health services.  Time spent: 35 minutes  Author: Vassie Loll, MD  03/28/2023 5:21 PM  For on  call review [email or web address].

## 2023-03-28 NOTE — TOC Progression Note (Addendum)
Transition of Care [facility]) - Progression Note    Patient Details  Name: Jared Bridges MRN: [id number] Date of Birth: October 15, 1930  Transition of Care [facility]) CM/SW Contact  Catalina Gravel, LCSW Phone Number: 03/28/2023, 3:57 PM  Clinical Narrative:    HH orders requested. Pt to DC 1-2 days.   Expected Discharge Plan: Home w Home Health Services Barriers to Discharge: Continued Medical Work up  Expected Discharge Plan and Services     Post Acute Care Choice: Durable Medical Equipment (motorized chair, electric lift) Living arrangements for the past 2 months: Single Family Home                                       Social Determinants of Health (SDOH) Interventions SDOH Screenings   Food Insecurity: No Food Insecurity (01/01/2023)  Housing: Low Risk  (01/01/2023)  Transportation Needs: No Transportation Needs (01/01/2023)  Utilities: Not At Risk (01/01/2023)  Recent Concern: Utilities - At Risk (11/27/2022)  Depression (PHQ2-9): Low Risk  (01/03/2021)  Tobacco Use: Medium Risk (03/25/2023)    Readmission Risk Interventions    03/26/2023   10:59 AM 01/02/2023   12:22 PM 06/23/2022    1:26 PM  Readmission Risk Prevention Plan  Transportation Screening Complete Complete Complete  HRI or Home Care Consult   Complete  Social Work Consult for Recovery Care Planning/Counseling   Complete  Palliative Care Screening   Not Applicable  Medication Review Oceanographer) Complete Complete Complete  HRI or Home Care Consult Complete Complete   SW Recovery Care/Counseling Consult  Complete   Palliative Care Screening Not Applicable Not Applicable   Skilled Nursing Facility Not Applicable Not Applicable

## 2023-03-28 NOTE — Progress Notes (Signed)
Dressing changed to sacrum. Wet to dry dressing removed and moderate amount of sanguineous drainage.

## 2023-03-29 DIAGNOSIS — E7800 Pure hypercholesterolemia, unspecified: Secondary | ICD-10-CM | POA: Diagnosis not present

## 2023-03-29 DIAGNOSIS — G9341 Metabolic encephalopathy: Secondary | ICD-10-CM | POA: Diagnosis not present

## 2023-03-29 DIAGNOSIS — N390 Urinary tract infection, site not specified: Secondary | ICD-10-CM | POA: Diagnosis not present

## 2023-03-29 DIAGNOSIS — E1165 Type 2 diabetes mellitus with hyperglycemia: Secondary | ICD-10-CM | POA: Diagnosis not present

## 2023-03-29 LAB — BASIC METABOLIC PANEL
Anion gap: 7 (ref 5–15)
BUN: 15 mg/dL (ref 8–23)
CO2: 22 mmol/L (ref 22–32)
Calcium: 8.6 mg/dL — ABNORMAL LOW (ref 8.9–10.3)
Chloride: 108 mmol/L (ref 98–111)
Creatinine, Ser: 0.67 mg/dL (ref 0.61–1.24)
GFR, Estimated: >60 mL/min (ref >60)
Glucose, Bld: 94 mg/dL (ref 70–99)
Potassium: 3.8 mmol/L (ref 3.5–5.1)
Sodium: 137 mmol/L (ref 135–145)

## 2023-03-29 MED ORDER — AMOXICILLIN-POT CLAVULANATE 875-125 MG PO TABS: 1.0000 | ORAL_TABLET | Freq: Two times a day (BID) | ORAL | 0 refills | Status: AC

## 2023-03-29 NOTE — TOC Transition Note (Signed)
Transition of Care [facility]) - CM/SW Discharge Note   Patient Details  Name: Jared Bridges MRN: [id number] Date of Birth: 07/13/1930  Transition of Care (TOC) CM/SW Contact:  Catalina Gravel, LCSW Phone Number: 03/29/2023, 4:45 PM   Clinical Narrative:     CSW arranged EMS transport at DC.  Contacted family- they were at hospital, headed home.  Transport arrived within 30 minutes of call.  CSW contacted [facility] advised of DC.No further TOC needs.  Final next level of care: Home w Home Health Services Barriers to Discharge: No Barriers Identified   Patient Goals and CMS Choice      Discharge Placement                      Patient and family notified of of transfer: 03/29/23  Discharge Plan and Services Additional resources added to the After Visit Summary for       Post Acute Care Choice: Durable Medical Equipment (motorized chair, electric lift)                    HH Arranged: PT HH Agency:  [employer]) Date HH Agency Contacted: 03/29/23 Time HH Agency Contacted: 1644    Social Determinants of Health (SDOH) Interventions SDOH Screenings   Food Insecurity: No Food Insecurity (01/01/2023)  Housing: Low Risk  (01/01/2023)  Transportation Needs: No Transportation Needs (01/01/2023)  Utilities: Not At Risk (01/01/2023)  Recent Concern: Utilities - At Risk (11/27/2022)  Depression (PHQ2-9): Low Risk  (01/03/2021)  Tobacco Use: Medium Risk (03/25/2023)     Readmission Risk Interventions    03/26/2023   10:59 AM 01/02/2023   12:22 PM 06/23/2022    1:26 PM  Readmission Risk Prevention Plan  Transportation Screening Complete Complete Complete  HRI or Home Care Consult   Complete  Social Work Consult for Recovery Care Planning/Counseling   Complete  Palliative Care Screening   Not Applicable  Medication Review Oceanographer) Complete Complete Complete  HRI or Home Care Consult Complete Complete   SW Recovery Care/Counseling Consult  Complete   Palliative Care  Screening Not Applicable Not Applicable   Skilled Nursing Facility Not Applicable Not Applicable

## 2023-03-29 NOTE — Discharge Summary (Signed)
Physician Discharge Summary   Patient: Jared Bridges MRN: [id number] DOB: 1930/07/21  Admit date:     03/25/2023  Discharge date: 03/29/23  Discharge Physician: Vassie Loll   PCP: Dorothey Baseman, MD   Recommendations at discharge:  Repeat basic metabolic panel to follow ultralights renal function Repeat CBC to follow hemoglobin trend/stability Reassess blood pressure and further adjust antihypertensive agents  Discharge Diagnoses: Principal Problem:   Acute metabolic encephalopathy Active Problems:   Acute urinary retention   UTI (urinary tract infection)   Hypercholesterolemia   Benign prostatic hyperplasia with urinary obstruction   Essential (primary) hypertension   Controlled type 2 diabetes mellitus with hyperglycemia, without long-term current use of insulin [facility])  Brief hospital admission course: Jared Bridges is a 89 y.o. male with medical history significant of iron deficiency anemia, diverticulitis, hypertension, hyperlipidemia, peptic ulcer disease, polymyalgia rheumatica, thyroid disease, TIA, type 2 diabetes mellitus, and upper GI bleed presents ED with a chief complaint of altered mental status.  Family reports that he had recently been admitted to an outside facility for veterans with spinal cord injuries.  He has not been acting his normal self since that admission.  Today they brought him into the ER because he noticed purulent drainage in his Foley and around his Foley.  He had Also been more fatigued. Unfortunately, patient is currently non-verbal and not following commands. He is not able to provide any more history at this time.   Assessment and Plan: * Acute metabolic encephalopathy -Likely related to UTI - Improved/resolved and with mentation back to baseline. -Continue maintaining adequate hydration and continue antibiotics. -Continue constant reorientation.  Acute urinary retention - With chronic indwelling Foley - Foley has been changed out at time  of admission - Continue Foley care, complete antibiotic therapy as prescribed. -Continue the use of Proscar and alfuzosin. -Continue outpatient follow-up with urology service as discussed.  Controlled type 2 diabetes mellitus with hyperglycemia, without long-term current use of insulin ([name] -Stable and well-controlled -Patient following diet management for the most part -Continue to follow CBGs fluctuation.  Essential (primary) hypertension -Stable  -Continue to follow vital signs fluctuation -Maintain adequate hydration. -Continue current use of alpha blockers as previously prescribed.  Benign prostatic hyperplasia with urinary obstruction - Continue alfuzosin and Proscar -Continue patient follow-up with urology service.  Hypercholesterolemia - Continue Lipitor  UTI (urinary tract infection) -Associated with chronic Foley catheter -More than 100,000 colonies of Klebsiella pneumoniae appreciated -Following culture results/sensitivity antibiotics will be transition to Augmentin. -Continue supportive care and clinical response. -Maintain adequate hydration  -Recommending patient's Foley catheter to be changed every 3-4 weeks to minimize recurrent infection.  Pressure injury -Present at time of admission -No signs of superimposed infection -Continue constant repositioning and preventive measures.  Consultants: None Procedures performed: See below for x-ray reports. Disposition: Home with home health services. Diet recommendation: Heart healthy diet.  DISCHARGE MEDICATION: Allergies as of 03/29/2023       Reactions   Iodine Hives, Itching, Other (See Comments)   very allergic(per daughter), can pre-med with benadryl   Calan [verapamil] Other (See Comments)   Weakness   Cymbalta [duloxetine Hcl] Other (See Comments)   Makes patient have jerking motions.   Iodinated Contrast Media Hives, Itching, Other (See Comments)   Can pre-med with benadryl   Latex Other (See  Comments)   Redness iritation   Lisinopril Other (See Comments)   Makes patient have jerking motions.   Neurontin [gabapentin] Other (See Comments)   Makes patient  have jerking motions.   Zoloft [sertraline Hcl] Diarrhea, Other (See Comments)   Makes patient have jerking motions.   Valium [diazepam] Other (See Comments)   "Did not work"   Patent attorney Hcl] Other (See Comments)   Dizziness   Glimepiride Other (See Comments)   "Did not work"   Melatonin Nausea Only   Tape Other (See Comments)   Skin irritation        Medication List     TAKE these medications    albuterol 108 (90 Base) MCG/ACT inhaler Commonly known as: VENTOLIN HFA Inhale 2 puffs into the lungs every 6 (six) hours as needed for wheezing or shortness of breath.   alfuzosin 10 MG 24 hr tablet Commonly known as: UROXATRAL Take 1 tablet (10 mg total) by mouth daily with breakfast.   amoxicillin-clavulanate 875-125 MG tablet Commonly known as: AUGMENTIN Take 1 tablet by mouth every 12 (twelve) hours for 7 days.   atorvastatin 40 MG tablet Commonly known as: LIPITOR Take 1 tablet (40 mg total) by mouth daily.   Calcium 200 MG Tabs Take 400 mg by mouth in the morning and at bedtime.   clotrimazole-betamethasone cream Commonly known as: Lotrisone Apply 1 Application topically 2 (two) times daily. Apply to the infected area twice a day   cyanocobalamin 100 MCG tablet Take 1,000 mcg by mouth daily.   dextromethorphan-guaiFENesin 30-600 MG 12hr tablet Commonly known as: MUCINEX DM Take 1 tablet by mouth daily as needed for cough.   ferrous sulfate 325 (65 FE) MG tablet Take 1 tablet (325 mg total) by mouth 2 (two) times daily with a meal. Mon, wed, fri What changed:  when to take this additional instructions   finasteride 5 MG tablet Commonly known as: PROSCAR Take 1 tablet (5 mg total) by mouth daily.   furosemide 20 MG tablet Commonly known as: Lasix Take 1 tablet (20 mg total) by mouth  every other day. Tuesday, Thursday and Saturday   loperamide 2 MG capsule Commonly known as: IMODIUM Take 2 mg by mouth once. Once daily   magic mouthwash (nystatin, lidocaine, diphenhydrAMINE, alum & mag hydroxide) suspension Swish and spit 5 mLs 4 (four) times daily as needed for mouth pain.   Otezla 30 MG Tabs Generic drug: Apremilast Take 30 mg by mouth 2 (two) times daily.   pantoprazole 40 MG tablet Commonly known as: Protonix Take 1 tablet (40 mg total) by mouth 2 (two) times daily. What changed: when to take this   pregabalin 75 MG capsule Commonly known as: LYRICA Take 1 capsule (75 mg total) by mouth 2 (two) times daily.   topiramate 50 MG tablet Commonly known as: TOPAMAX Take 0.5 tablets (25 mg total) by mouth 2 (two) times daily. What changed: how much to take   vitamin C 250 MG tablet Commonly known as: ASCORBIC ACID Take 500 mg by mouth daily.   Vitamin D3 1000 units Caps Take 1,000 Units by mouth daily.   Xtandi 80 MG tablet Generic drug: enzalutamide Take 80 mg by mouth daily.   zolpidem 5 MG tablet Commonly known as: AMBIEN Take 5-10 mg by mouth at bedtime as needed for sleep.               Discharge Care Instructions  (From admission, onward)           Start     Ordered   03/29/23 0000  Discharge wound care:       Comments: Continue to keep area clean,  dry and make sure patient received constant repositioning while providing preventative measures to minimize further breakdown of bony prominences.   03/29/23 1555            Follow-up Information     Dorothey Baseman, MD. Schedule an appointment as soon as possible for a visit in 10 day(s).   Specialty: Family Medicine Contact information: [address] [place] [address] [phone]                Discharge Exam: Filed Weights   03/25/23 1617 03/26/23 0800  Weight: 71.7 kg 73.1 kg   General exam: Alert, awake, oriented and following commands appropriately  (mentation back to baseline); afebrile, no chest pain, no nausea, no vomiting.  Reports feeling much better. Respiratory system: Clear to auscultation. Respiratory effort normal.  Good saturation on room air. Cardiovascular system: No rubs or gallops; no JVD. Gastrointestinal system: Abdomen is nondistended, soft and nontender. No organomegaly or masses felt. Normal bowel sounds heard.  Chronic Foley catheter in place. Central nervous system: No new focal deficit. Extremities: No cyanosis or clubbing. Skin: No petechiae.  Present on admission multiple stage Ia/stage II pressure injuries appreciated in his sacral areas and heels bilaterally; continue preventive measures and constant repositioning.  No signs of superimposed infection. Psychiatry: Judgement and insight appear normal. Mood & affect appropriate.   Condition at discharge: Stable and improved.  The results of significant diagnostics from this hospitalization (including imaging, microbiology, ancillary and laboratory) are listed below for reference.   Imaging Studies: DG Chest Port 1 View  Result Date: 03/25/2023 CLINICAL DATA:  Cough, question UTI EXAM: PORTABLE CHEST 1 VIEW COMPARISON:  Portable exam 1727 hours compared to 12/31/2022 FINDINGS: Normal heart size, mediastinal contours, and pulmonary vascularity. Atherosclerotic calcification aorta. Peribronchial thickening with slight increase in LEFT infrahilar markings versus previous exam, question atelectasis versus infiltrate. Remaining lungs clear. No pleural effusion or pneumothorax. Bones demineralized. IMPRESSION: Bronchitic changes with question mild atelectasis versus infiltrate at LEFT base. Aortic Atherosclerosis (ICD10-I70.0). Electronically Signed   By: Ulyses Southward M.D.   On: 03/25/2023 17:51    Microbiology: Results for orders placed or performed during the hospital encounter of 03/25/23  Culture, blood (routine x 2)     Status: None (Preliminary result)   Collection  Time: 03/25/23  5:19 PM   Specimen: BLOOD RIGHT ARM  Result Value Ref Range Status   Specimen Description BLOOD RIGHT ARM  Final   Special Requests   Final    BOTTLES DRAWN AEROBIC ONLY Blood Culture adequate volume   Culture   Final    NO GROWTH 4 DAYS Performed at [facility], [address], [place], [place] [address]    Report Status PENDING  Incomplete  Culture, blood (routine x 2)     Status: None (Preliminary result)   Collection Time: 03/25/23  6:00 PM   Specimen: Right Antecubital; Blood  Result Value Ref Range Status   Specimen Description RIGHT ANTECUBITAL  Final   Special Requests   Final    BOTTLES DRAWN AEROBIC AND ANAEROBIC Blood Culture adequate volume   Culture   Final    NO GROWTH 4 DAYS Performed at [facility], [address], [place], [place] [address]    Report Status PENDING  Incomplete  Urine Culture     Status: Abnormal   Collection Time: 03/25/23  7:42 PM   Specimen: Urine, Clean Catch  Result Value Ref Range Status   Specimen Description   Final  URINE, CLEAN CATCH Performed at [facility], [address], [place], [place] [address]    Special Requests   Final    NONE Reflexed from [id number] Performed at [facility], [address] [place], [place] [address]    Culture >=100,000 COLONIES/mL KLEBSIELLA PNEUMONIAE (A)  Final   Report Status 03/28/2023 FINAL  Final   Organism ID, Bacteria KLEBSIELLA PNEUMONIAE (A)  Final      Susceptibility   Klebsiella pneumoniae - MIC*    AMPICILLIN >=32 RESISTANT Resistant     CEFAZOLIN <=4 SENSITIVE Sensitive     CEFEPIME <=0.12 SENSITIVE Sensitive     CEFTRIAXONE <=0.25 SENSITIVE Sensitive     CIPROFLOXACIN <=0.25 SENSITIVE Sensitive     GENTAMICIN <=1 SENSITIVE Sensitive     IMIPENEM <=0.25 SENSITIVE Sensitive     NITROFURANTOIN 64 INTERMEDIATE Intermediate     TRIMETH/SULFA <=20 SENSITIVE Sensitive     AMPICILLIN/SULBACTAM 8 SENSITIVE Sensitive     PIP/TAZO 8 SENSITIVE Sensitive     * >=100,000  COLONIES/mL KLEBSIELLA PNEUMONIAE    Labs: CBC: Recent Labs  Lab 03/25/23 1720 03/26/23 0339  WBC 8.4 7.5  NEUTROABS 6.8 5.9  HGB 10.6* 8.7*  HCT 34.2* 28.0*  MCV 93.7 93.6  PLT 197 192   Basic Metabolic Panel: Recent Labs  Lab 03/25/23 1718 03/26/23 0339 03/28/23 0401 03/29/23 0420  NA 136 137 137 137  K 3.6 3.3* 3.0* 3.8  CL 103 105 106 108  CO2 25 25 24 22   GLUCOSE 104* 96 128* 94  BUN 17 17 20 15   CREATININE 0.71 0.55* 0.80 0.67  CALCIUM 9.6 8.8* 8.6* 8.6*  MG 2.0 1.7  --   --    Liver Function Tests: Recent Labs  Lab 03/25/23 1718 03/26/23 0339  AST 13* 11*  ALT 10 8  ALKPHOS 52 41  BILITOT 0.9 0.4  PROT 7.2 5.8*  ALBUMIN 2.8* 2.3*   CBG: No results for input(s): "GLUCAP" in the last 168 hours.  Discharge time spent: greater than 30 minutes.  Signed: Vassie Loll, MD [facility] 03/29/2023

## 2023-03-30 LAB — CULTURE, BLOOD (ROUTINE X 2)
Culture: NO GROWTH
Culture: NO GROWTH
Special Requests: ADEQUATE
Special Requests: ADEQUATE

## 2023-04-14 ENCOUNTER — Encounter (INDEPENDENT_AMBULATORY_CARE_PROVIDER_SITE_OTHER): Payer: Self-pay

## 2023-04-16 ENCOUNTER — Encounter (INDEPENDENT_AMBULATORY_CARE_PROVIDER_SITE_OTHER): Payer: Self-pay | Admitting: Gastroenterology

## 2023-04-16 ENCOUNTER — Telehealth (INDEPENDENT_AMBULATORY_CARE_PROVIDER_SITE_OTHER): Payer: Medicare Other | Admitting: Gastroenterology

## 2023-04-16 ENCOUNTER — Telehealth (INDEPENDENT_AMBULATORY_CARE_PROVIDER_SITE_OTHER): Payer: Self-pay | Admitting: *Deleted

## 2023-04-16 VITALS — BP 110/70

## 2023-04-16 DIAGNOSIS — D500 Iron deficiency anemia secondary to blood loss (chronic): Secondary | ICD-10-CM

## 2023-04-16 NOTE — Progress Notes (Addendum)
Primary Care Physician:  Dorothey Baseman, MD  Primary GI: Dr. Levon Hedger   Patient Location: Home   Provider Location: [facility] office   Reason for Visit: follow up    Total time (minutes) spent on medical discussion: 10 minutes  Virtual Visit via MyChart Video Note visit is conducted virtually and was requested by patient.   I connected with Jared Bridges on 04/16/23 at  9:45 AM EDT via Mychart video and verified that I am speaking with the correct person using two identifiers.   I discussed the limitations, risks, security and privacy concerns of performing an evaluation and management service by telephone and the availability of in person appointments. I also discussed with the patient that there may be a patient responsible charge related to this service. The patient expressed understanding and agreed to proceed.  Chief Complaint  Patient presents with   Gastroesophageal Reflux    Virtual vist for a follow up on GERD. Patient is with daughter Jared Bridges. Reports he is doing well   History of Present Illness: Jared Bridges. Jared Bridges is a 89 y.o. male with a history of depression, diverticulitis, history of gastric ulcers, PMR, stroke, TIA, diabetes    Last seen in February, at that time doing well. Having a BM daily to every few days. Using imodium PRN. On augmentin at that time, doing daily probiotic and yogurt. Appetite improving. Taking protonix BID with good results.  Recommended repeat h&h in 2 week, continue PO iron BID, PPI Daily, Imodium PRN, liberalize diet and protein shakes in between meals   Present:  Patient's daughter Jared Bridges, caregiver provides history. Patient recently went to [place] to a rehab facility which he stayed at from 4/3/-4/19, patient declined to stay longer. She reports he had a UTI upon arrival to the facility but on 4/24 he began to appear more ill. He went to [facility] where he had krebsiella in his urine and had to have a foley placed.   Stools are  formed, no diarrhea. Is not using imodium at this time. Appetite is good. Daughter reports he seems to be doing well. They are getting him back into physical therapy as well.   Last hgb 8.7 on 4/25, notably 10.6 on 4/24 (other cell lines decreased as well, query IVF). No rectal bleeding or melena, no c/o SOB, fatigue, dizziness. Still doing his iron pills twice daily as well as protonix 40mg  BID. He gets his medications through the [place] and they are seeing Dr. Clent Ridges there on 5/29.   Last iron studies 12/30/22 with iron 26, tibc 158, iron sat 16%, ferritin 96 B12 1143, folate 8.8  Daughter Jared Bridges is unsure if he will be having upcoming labs and has home health come in that can draw labs for [name] if needed.   EGD:08/09/22  - 1 cm hiatal hernia.                           - Normal stomach.                           - Normal examined duodenum.                           - No specimens collected. Colonoscopy:08/12/22 - The terminal ileum appeared normal up to 20 cm  from IC valve.                           - Four 3 to 4 mm polyps in the sigmoid colon, in                            the transverse colon, in the ascending colon and in                            the cecum, removed with a cold snare. Resected and                            retrieved.                           - Patent end-to-side colo-colonic anastomosis,                            characterized by healthy appearing mucosa.                           - The distal rectum and anal verge are normal on                            retroflexion view. Givens capsule: 08/13/22 Normal appearing small bowel mucosa with some intermittent limited views due to effluent.   Past Medical History:  Diagnosis Date   Acute cholecystitis 04/05/2016   Anemia, iron deficiency    Chronic headaches    Migraines   Chronic kidney disease    Resolved   Depression    Diverticulitis    Essential hypertension    GI bleed    Dr. Karilyn Cota -  1998   Hypercholesteremia    Orthostatic hypotension    Peptic ulcer disease    Peripheral neuropathy    Polymyalgia rheumatica ([name]    Prostate cancer ([name]    Rectal bleeding 11/26/2017   Added automatically from request for surgery [id number]   Stroke [facility])    Residual trouble reading and writing   Syncope    Thyroid nodule    TIA (transient ischemic attack) 12/2017   Type 2 diabetes mellitus ([name]    Upper GI bleed 06/18/2012     Past Surgical History:  Procedure Laterality Date   APPENDECTOMY     as child   BACK SURGERY     2000 IDET SPINAL PROC   CHOLECYSTECTOMY N/A 04/07/2016   Procedure: CHOLECYSTECTOMY;  Surgeon: Franky Macho, MD;  Location: [facility];  Service: General;  Laterality: N/A;   COLON SURGERY     FOR DIVERTICULOSIS   1988   COLONOSCOPY N/A 01/07/2018   Procedure: COLONOSCOPY;  Surgeon: Malissa Hippo, MD;  Location: [facility];  Service: Endoscopy;  Laterality: N/A;  1:00   COLONOSCOPY WITH PROPOFOL N/A 08/12/2022   Procedure: COLONOSCOPY WITH PROPOFOL;  Surgeon: Dolores Frame, MD;  Location: [facility];  Service: Gastroenterology;  Laterality: N/A;  ASA 3   CRYOTHERAPY     ESOPHAGOGASTRODUODENOSCOPY (EGD) WITH PROPOFOL N/A 08/09/2022   Procedure: ESOPHAGOGASTRODUODENOSCOPY (EGD) WITH PROPOFOL;  Surgeon: Dolores Frame, MD;  [facility]: [facility];  Service: Gastroenterology;  Laterality:  N/A;   EYE SURGERY     right cataract with lens implant   FEMUR SURGERY     RT LEG        1953   GIVENS CAPSULE STUDY N/A 08/13/2022   Procedure: GIVENS CAPSULE STUDY;  Surgeon: Dolores Frame, MD;  [facility]: [facility];  Service: Gastroenterology;  Laterality: N/A;   HAND SURGERY     RIGHT   HERNIA REPAIR     1-right inguinal, 3- left inguinal   IRRIGATION AND DEBRIDEMENT KNEE Left 07/09/2015   Procedure: LEFT KNEE IRRIGATION AND DEBRIDEMENT WOUND CLOSURE;  Surgeon: Ollen Gross, MD;  Location: [facility];  Service: Orthopedics;   Laterality: Left;   JOINT REPLACEMENT  02/28/2016   lt knee revision   KNEE DEBRIDEMENT     2016   LEFT   MENISCUS REPAIR     LEFT   POLYPECTOMY  01/07/2018   Procedure: POLYPECTOMY;  Surgeon: Malissa Hippo, MD;  [facility];  Service: Endoscopy;;  colon    POLYPECTOMY  08/12/2022   Procedure: POLYPECTOMY;  Surgeon: Dolores Frame, MD;  [facility];  Service: Gastroenterology;;   SHOULDER OPEN ROTATOR CUFF REPAIR     RT SHOULDER   SPINAL FUSION     SPINAL FUSION     TONSILLECTOMY     TOTAL KNEE ARTHROPLASTY Left 05/28/2015   Procedure: LEFT TOTAL KNEE ARTHROPLASTY;  Surgeon: Ollen Gross, MD;  Location: [facility];  Service: Orthopedics;  Laterality: Left;   TOTAL KNEE REVISION Left 02/28/2016   Procedure: LEFT TOTAL KNEE REVISION;  Surgeon: Tarry Kos, MD;  Location: [facility];  Service: Orthopedics;  Laterality: Left;   TRIGGER FINGER RELEASE     RT HAND   1990S   UVULECTOMY  2004   TURBINATE,TONSIL,ADENOID     Current Meds  Medication Sig   albuterol (VENTOLIN HFA) 108 (90 Base) MCG/ACT inhaler Inhale 2 puffs into the lungs every 6 (six) hours as needed for wheezing or shortness of breath.   alfuzosin (UROXATRAL) 10 MG 24 hr tablet Take 1 tablet (10 mg total) by mouth daily with breakfast.   Apremilast (OTEZLA) 30 MG TABS Take 30 mg by mouth 2 (two) times daily.   atorvastatin (LIPITOR) 40 MG tablet Take 1 tablet (40 mg total) by mouth daily.   Calcium 200 MG TABS Take 400 mg by mouth in the morning and at bedtime.   Cholecalciferol (VITAMIN D3) 1000 units CAPS Take 1,000 Units by mouth daily.   clotrimazole-betamethasone (LOTRISONE) cream Apply 1 Application topically 2 (two) times daily. Apply to the infected area twice a day   cyanocobalamin 100 MCG tablet Take 1,000 mcg by mouth daily.   dextromethorphan-guaiFENesin (MUCINEX DM) 30-600 MG 12hr tablet Take 1 tablet by mouth daily as needed for cough.   enzalutamide (XTANDI) 80 MG tablet Take 80  mg by mouth daily.   ferrous sulfate 325 (65 FE) MG tablet Take 1 tablet (325 mg total) by mouth 2 (two) times daily with a meal. Mon, wed, fri (Patient taking differently: Take 325 mg by mouth 2 (two) times daily.)   finasteride (PROSCAR) 5 MG tablet Take 1 tablet (5 mg total) by mouth daily.   furosemide (LASIX) 20 MG tablet Take 1 tablet (20 mg total) by mouth every other day. Tuesday, Thursday and Saturday   loperamide (IMODIUM) 2 MG capsule Take 2 mg by mouth as needed. Once daily as needed   magic mouthwash (nystatin, lidocaine, diphenhydrAMINE, alum &  mag hydroxide) suspension Swish and spit 5 mLs 4 (four) times daily as needed for mouth pain.   pantoprazole (PROTONIX) 40 MG tablet Take 1 tablet (40 mg total) by mouth 2 (two) times daily. (Patient taking differently: Take 40 mg by mouth daily.)   pregabalin (LYRICA) 75 MG capsule Take 1 capsule (75 mg total) by mouth 2 (two) times daily.   topiramate (TOPAMAX) 50 MG tablet Take 0.5 tablets (25 mg total) by mouth 2 (two) times daily. (Patient taking differently: Take 50 mg by mouth 2 (two) times daily.)   vitamin C (ASCORBIC ACID) 250 MG tablet Take 500 mg by mouth daily.   zolpidem (AMBIEN) 5 MG tablet Take 5-10 mg by mouth at bedtime as needed for sleep.     Family History  Problem Relation Age of Onset   Colon cancer Sister    Hypertension Other    Depression Brother    Depression Daughter     Social History   Socioeconomic History   Marital status: Married    Spouse name: Not on file   Number of children: Not on file   Years of education: Not on file   Highest education level: Not on file  Occupational History   Not on file  Tobacco Use   Smoking status: Former    Packs/day: 0.10    Years: 18.00    Additional pack years: 0.00    Total pack years: 1.80    Types: Cigars, Cigarettes    Start date: 12/18/1958    Quit date: 12/01/1986    Years since quitting: 36.3   Smokeless tobacco: Never   Tobacco comments:    Smoke  cigars daily per daughter.  Vaping Use   Vaping Use: Never used  Substance and Sexual Activity   Alcohol use: No    Alcohol/week: 0.0 standard drinks of alcohol   Drug use: No   Sexual activity: Not Currently  Other Topics Concern   Not on file  Social History Narrative   Married for 59 years.Retired Press photographer.   Social Determinants of Health   Financial Resource Strain: Not on file  Food Insecurity: No Food Insecurity (01/01/2023)   Hunger Vital Sign    Worried About Running Out of Food in the Last Year: Never true    Ran Out of Food in the Last Year: Never true  Transportation Needs: No Transportation Needs (01/01/2023)   PRAPARE - Administrator, Civil Service (Medical): No    Lack of Transportation (Non-Medical): No  Physical Activity: Not on file  Stress: Not on file  Social Connections: Not on file   Review of Systems: Gen: Denies fever, chills, anorexia. Denies fatigue, weakness, weight loss.  CV: Denies chest pain, palpitations, syncope, peripheral edema, and claudication. Resp: Denies dyspnea at rest, cough, wheezing, coughing up blood, and pleurisy. GI: see HPI Derm: Denies rash, itching, dry skin Psych: Denies depression, anxiety, memory loss, confusion. No homicidal or suicidal ideation.  Heme: Denies bruising, bleeding, and enlarged lymph nodes.  Observations/Objective: No distress. Unable to perform physical exam due to telephone encounter. No video available.   Assessment and Plan: Jared Bridges is a 89 y.o. male with a history of depression, diverticulitis, history of gastric ulcers, PMR, stroke, TIA, diabetes    Patient doing well from GI standpoint. Having formed stools. Has not needed PRN imodium recently. Appetite is good and weight stable. Last hgb 8.7 on 4/25 during admission for UTI, notably 10.6 on 4/24 other cell lines  decreased as well, query hemodilution secondary to IVF. Daughter Jared Bridges denies rectal bleeding or melena. He has  not complained of fatigue, dizziness or shortness of breath. Still doing his iron pills twice daily as well as protonix 40mg  BID. Last iron studies were in January.  We will update h&h and check iron studies to ensure labs are stable. For now will continue with PO iron BID and PPI BID.   -h&h (home health will collect) -Iron studies (home health will collect) -continue with PO Iron BID -Protonix 40mg  BID   Follow Up Instructions: 3 months    I discussed the assessment and treatment plan with the patient. The patient was provided an opportunity to ask questions and all were answered. The patient agreed with the plan and demonstrated an understanding of the instructions.   The patient was advised to call back or seek an in-person evaluation if the symptoms worsen or if the condition fails to improve as anticipated.  I provided 10 minutes of face-to-face time during this MyChart Video encounter.  Cady Hafen L. Jeanmarie Hubert, MSN, APRN, AGNP-C Adult-Gerontology Nurse Practitioner [facility]  I have reviewed the note and agree with the APP's assessment as described in this progress note  Katrinka Blazing, MD Gastroenterology and Hepatology [facility]astroenterology

## 2023-04-16 NOTE — Telephone Encounter (Signed)
Patient seen today and chelsea ordered H&H, iron, iron sat, tibc, and ferritin. I called [facility]. Phone number [phone] and spoke with John-L and she took verbal orders and said nurse would draw next week when she goes out next. Will hold message to follow up on results.

## 2023-04-16 NOTE — Patient Instructions (Signed)
I'm so glad Mr. Soucie is doing well! For now we will continue with the protonix 40mg  twice daily and iron pills twice daily We will update hemoglobin and iron studies  Follow up 3 months   It was a pleasure to see you today. I want to create trusting relationships with patients and provide genuine, compassionate, and quality care. I truly value your feedback! please be on the lookout for a survey regarding your visit with me today. I appreciate your input about our visit and your time in completing this!    Kashis Penley L. Jeanmarie Hubert, MSN, APRN, AGNP-C Adult-Gerontology Nurse Practitioner [facility] Gastroenterology at [facility]

## 2023-04-17 ENCOUNTER — Other Ambulatory Visit: Payer: Self-pay

## 2023-04-17 ENCOUNTER — Encounter: Payer: Self-pay | Admitting: Urology

## 2023-04-17 MED ORDER — SULFAMETHOXAZOLE-TRIMETHOPRIM 800-160 MG PO TABS: 1.0000 | ORAL_TABLET | Freq: Two times a day (BID) | ORAL | 0 refills | Status: DC

## 2023-04-24 NOTE — Telephone Encounter (Signed)
Called [facility] and was told that labs were drawn on Monday 5/20 but did not have results and she was going to call lab to get results and fax them over. Await results.

## 2023-04-29 NOTE — Telephone Encounter (Signed)
I called [facility] again, Spoke with [employer]. She says she will have someone to call me to discuss sending the lab results.

## 2023-05-04 ENCOUNTER — Other Ambulatory Visit: Payer: Self-pay | Admitting: Urology

## 2023-05-04 ENCOUNTER — Telehealth (INDEPENDENT_AMBULATORY_CARE_PROVIDER_SITE_OTHER): Payer: Self-pay | Admitting: *Deleted

## 2023-05-04 ENCOUNTER — Telehealth: Payer: Self-pay

## 2023-05-04 NOTE — Telephone Encounter (Signed)
Daughter does want to hold off on testing at this time since not watery and does not notice any abnormal smell at this time. Will call back if anything changes.

## 2023-05-04 NOTE — Telephone Encounter (Signed)
Daughter [name] called and states home health came out today to bathe patient and noticed his stool had a little different smell again. He has been on a couple of antibiotics for UTI. She is requesting to do stool testing again. She states last time [facility] sent out to [place] and all test were not done. She states she can take to [facility] or whatever would be best.

## 2023-05-04 NOTE — Telephone Encounter (Signed)
Patient's daughter states that pt's [facility] nurse with [facility] had mentioned having an SP tube placed to help with his infections.  Patient has trouble getting to appointment's now, he has to arrange transportation and is bed bound.  Patient's daughter is asking if you think this would be a good idea for patient and if he could have a referral to IR?  Please advise.

## 2023-05-04 NOTE — Telephone Encounter (Signed)
Daughter states she left this message on Friday after we closed and since then she said home health nurse said he was ok and did not need testing. Jared Bridges states it is not watery and will call back if any issues.

## 2023-05-05 NOTE — Telephone Encounter (Signed)
Called [facility] again and was told she has been trying to get [facility] to send results and has not received anything. She was going to have her manager look in to it and fax [name] results when they receive them.

## 2023-05-08 ENCOUNTER — Encounter: Payer: Self-pay | Admitting: Urology

## 2023-05-08 NOTE — Telephone Encounter (Signed)
Can you please see below in Dr. Dimas Millin absence.

## 2023-05-08 NOTE — Telephone Encounter (Signed)
Daughter aware of NP recommendations and voiced understanding.

## 2023-05-08 NOTE — Telephone Encounter (Signed)
Called [facility] again to get results and was told they were drawn in may but [facility] has made multiple attempts to get results from lab and unable to get results and I was told she did not know what else to do. I called [facility] and last labs they have under your name were in January. Do you want to have labs redrawn with [facility]?

## 2023-05-11 NOTE — Telephone Encounter (Signed)
Called [facility] to have labs redrawn and they need new order faxed to fax # [phone] atten samantha

## 2023-05-12 NOTE — Telephone Encounter (Signed)
Lab orders faxed to [facility] sam[facility]

## 2023-05-12 NOTE — Telephone Encounter (Signed)
Patient's daughter will call back to confirm whether patient would like to move forward with SP tube placement.

## 2023-05-15 ENCOUNTER — Telehealth: Payer: Self-pay

## 2023-05-15 NOTE — Telephone Encounter (Signed)
Ua results were faxed to Dr. Ronne Binning from [place] and daughter requested Dr. Ronne Binning treat.  I spoke with the patient's daughter Irving , she states the patient is not symptomatic she just noticed that his urine was darker than usual.  Verbal from Dr. Ronne Binning that he does not want to culture or treat for UTI unless patient is showing symptoms.  I called [facility] and spoke with Darnelle, she is aware no culture is needed.

## 2023-05-18 NOTE — Telephone Encounter (Signed)
Called [facility] again to get results of labs. Was told they do not have an order even though I faxed one 6 days ago. I gave a verbal order for h&h, iron, tibc and ferritin. Was told they would go back out on Thursday to draw labs. And they use lab at [facility]. Spoke with JohnL.

## 2023-05-26 NOTE — Telephone Encounter (Signed)
Called [facility] to get lab results and was told that someone would call me back. Await call back.

## 2023-05-27 NOTE — Telephone Encounter (Signed)
Discussed results with daughter [name]. She states he is taking iron twice daily and his stools are normal/brown. Has not noticed any rectal bleeding or melena. Did you want to order iron infusions? If so I could try to set up with [facility] to go to his home.

## 2023-05-27 NOTE — Telephone Encounter (Signed)
Did not received labs. I called back over to [facility] and was told labs were faxed. I verified the fax number they used and it was correct. She told me she would refax. Await labs.

## 2023-05-27 NOTE — Telephone Encounter (Signed)
Copied from cc chart:  Raquel Karmello, NP  Metro Kung, LPN Iron levels about the same as in January, unfortunately the ferritin did not result. Hemoglobin is stable at 10.7. Can we confirm that he is taking his IRON BID still and no rectal bleeding or melena? May need to consider iron infusions given levels have not improved much.       Previous Messages

## 2023-05-27 NOTE — Telephone Encounter (Signed)
Forms for [facility] filled out and on your desk to sign. Then will fax. Daughter [name] is aware we are working on setting up infusion. ( Need to add to reminder file to repeat iron studies one month after infusions. After I know when pt will receive last infusion.)

## 2023-05-27 NOTE — Telephone Encounter (Signed)
Labs to be done on 04/20/23 were done on 05/21/23 and finally faxed to [name] from [facility] and in epic - cc chart for you to review.

## 2023-05-28 NOTE — Telephone Encounter (Signed)
Forms faxed to [facility]

## 2023-06-01 ENCOUNTER — Emergency Department (HOSPITAL_COMMUNITY)
Admission: EM | Admit: 2023-06-01 | Discharge: 2023-06-02 | Disposition: A | Discharge status: Home / Self Care | Payer: No Typology Code available for payment source | Attending: Emergency Medicine | Admitting: Emergency Medicine

## 2023-06-01 ENCOUNTER — Other Ambulatory Visit: Payer: Self-pay

## 2023-06-01 ENCOUNTER — Emergency Department (HOSPITAL_COMMUNITY): Payer: No Typology Code available for payment source

## 2023-06-01 ENCOUNTER — Encounter (HOSPITAL_COMMUNITY): Payer: Self-pay

## 2023-06-01 DIAGNOSIS — Z8673 Personal history of transient ischemic attack (TIA), and cerebral infarction without residual deficits: Secondary | ICD-10-CM | POA: Insufficient documentation

## 2023-06-01 DIAGNOSIS — I509 Heart failure, unspecified: Secondary | ICD-10-CM | POA: Diagnosis not present

## 2023-06-01 DIAGNOSIS — N390 Urinary tract infection, site not specified: Secondary | ICD-10-CM | POA: Insufficient documentation

## 2023-06-01 DIAGNOSIS — I130 Hypertensive heart and chronic kidney disease with heart failure and stage 1 through stage 4 chronic kidney disease, or unspecified chronic kidney disease: Secondary | ICD-10-CM | POA: Diagnosis not present

## 2023-06-01 DIAGNOSIS — Z794 Long term (current) use of insulin: Secondary | ICD-10-CM | POA: Diagnosis not present

## 2023-06-01 DIAGNOSIS — E1122 Type 2 diabetes mellitus with diabetic chronic kidney disease: Secondary | ICD-10-CM | POA: Insufficient documentation

## 2023-06-01 DIAGNOSIS — Z91040 Latex allergy status: Secondary | ICD-10-CM | POA: Diagnosis not present

## 2023-06-01 DIAGNOSIS — N189 Chronic kidney disease, unspecified: Secondary | ICD-10-CM | POA: Insufficient documentation

## 2023-06-01 DIAGNOSIS — Y732 Prosthetic and other implants, materials and accessory gastroenterology and urology devices associated with adverse incidents: Secondary | ICD-10-CM | POA: Diagnosis not present

## 2023-06-01 DIAGNOSIS — T83511A Infection and inflammatory reaction due to indwelling urethral catheter, initial encounter: Secondary | ICD-10-CM | POA: Diagnosis present

## 2023-06-01 DIAGNOSIS — Z79899 Other long term (current) drug therapy: Secondary | ICD-10-CM | POA: Insufficient documentation

## 2023-06-01 DIAGNOSIS — Z8546 Personal history of malignant neoplasm of prostate: Secondary | ICD-10-CM | POA: Insufficient documentation

## 2023-06-01 LAB — COMPREHENSIVE METABOLIC PANEL
ALT: 6 U/L (ref 0–44)
AST: 11 U/L — ABNORMAL LOW (ref 15–41)
Albumin: 2.7 g/dL — ABNORMAL LOW (ref 3.5–5.0)
Alkaline Phosphatase: 64 U/L (ref 38–126)
Anion gap: 10 (ref 5–15)
BUN: 12 mg/dL (ref 8–23)
CO2: 24 mmol/L (ref 22–32)
Calcium: 9.3 mg/dL (ref 8.9–10.3)
Chloride: 102 mmol/L (ref 98–111)
Creatinine, Ser: 0.53 mg/dL — ABNORMAL LOW (ref 0.61–1.24)
GFR, Estimated: >60 mL/min (ref >60)
Glucose, Bld: 88 mg/dL (ref 70–99)
Potassium: 4.1 mmol/L (ref 3.5–5.1)
Sodium: 136 mmol/L (ref 135–145)
Total Bilirubin: 0.7 mg/dL (ref 0.3–1.2)
Total Protein: 6.7 g/dL (ref 6.5–8.1)

## 2023-06-01 LAB — URINALYSIS, ROUTINE W REFLEX MICROSCOPIC
Bilirubin Urine: NEGATIVE
Glucose, UA: NEGATIVE mg/dL
Ketones, ur: 5 mg/dL — AB
Nitrite: POSITIVE — AB
Protein, ur: 100 mg/dL — AB
RBC / HPF: >50 RBC/hpf (ref 0–5)
Specific Gravity, Urine: 1.016 (ref 1.005–1.030)
pH: 7 (ref 5.0–8.0)

## 2023-06-01 LAB — CBC WITH DIFFERENTIAL/PLATELET
Immature Granulocytes: 1 %
MCV: 95.3 fL (ref 80.0–100.0)
nRBC: 0 % (ref 0.0–0.2)

## 2023-06-01 LAB — APTT: aPTT: 31 seconds (ref 24–36)

## 2023-06-01 LAB — PROTIME-INR
INR: 1.1 (ref 0.8–1.2)
Prothrombin Time: 14.1 seconds (ref 11.4–15.2)

## 2023-06-01 LAB — LACTIC ACID, PLASMA: Lactic Acid, Venous: 1.6 mmol/L (ref 0.5–1.9)

## 2023-06-01 MED ORDER — LACTATED RINGERS IV BOLUS: 1000.0000 mL | Freq: Once | INTRAVENOUS | Status: DC

## 2023-06-01 MED ORDER — LACTATED RINGERS IV BOLUS
500.0000 mL | Freq: Once | INTRAVENOUS | Status: AC
  Administered 2023-06-01: 500 mL via INTRAVENOUS

## 2023-06-01 MED ORDER — CEPHALEXIN 500 MG PO CAPS: 500.0000 mg | ORAL_CAPSULE | Freq: Four times a day (QID) | ORAL | 0 refills | Status: DC

## 2023-06-01 MED ORDER — SODIUM CHLORIDE 0.9 % IV SOLN
1.0000 g | Freq: Once | INTRAVENOUS | Status: AC
  Administered 2023-06-01: 1 g via INTRAVENOUS
  Filled 2023-06-01: qty 10

## 2023-06-01 NOTE — ED Notes (Signed)
Family at bedside, pt is awake and resting, fluid initiated

## 2023-06-01 NOTE — Discharge Instructions (Signed)
Jared Bridges workup today shows that he has a urinary tract infection.  His Foley catheter has been changed.  He has been given antibiotics here in the emergency department and a prescription for antibiotics was sent to his pharmacy of record.  Please follow-up with his primary care provider or with his urologist.  Return to the emergency department for any new or worsening symptoms.  It has been a pleasure taking care of him

## 2023-06-01 NOTE — ED Provider Notes (Signed)
[facility]PARTMENT AT [facility] Provider Note   CSN: [id number] Arrival date & time: 06/01/23  1230     History {Add pertinent medical, surgical, social history, OB history to HPI:1} Chief Complaint  Patient presents with   Urinary Tract Infection    Jared Bridges is a 89 y.o. male.   Urinary Tract Infection      Jared Bridges is a 89 y.o. male past medical history of , hypertension, type 2 diabetes, peptic ulcer disease, prior stroke, chronic kidney disease, TIA, anemia.  CHF and prostate cancer who presents to the Emergency Department.  Patient also has history of spinal cord injuries, he is mostly nonverbal   Patient's daughter provides most of history.  She states that she noticed that he has had increased somnolence, decreased appetite and cloudy urine with wet sounding cough.  Does have been present for several days, gradually worsening.  States he is sleeping more than usual does not  have an appetite.  He has had a sacral decubitus for some time, home health has been dressing the area.  She endorses increased watery stools for several days, concerned this may be a source of infection as well as possible UTI.  No fever at home.  No nausea or vomiting.  Patient has indwelling Foley catheter at baseline.  Catheter last changed nearly 1 month ago    Home Medications Prior to Admission medications   Medication Sig Start Date End Date Taking? Authorizing Provider  albuterol (VENTOLIN HFA) 108 (90 Base) MCG/ACT inhaler Inhale 2 puffs into the lungs every 6 (six) hours as needed for wheezing or shortness of breath. 01/03/23   Briant Cedar, MD  alfuzosin (UROXATRAL) 10 MG 24 hr tablet Take 1 tablet (10 mg total) by mouth daily with breakfast. 02/23/23   McKenzie, Mardene Celeste, MD  Apremilast (OTEZLA) 30 MG TABS Take 30 mg by mouth 2 (two) times daily.    [provider]  atorvastatin (LIPITOR) 40 MG tablet Take 1 tablet (40 mg total) by mouth  daily. 07/09/22   Sharee Holster, NP  Calcium 200 MG TABS Take 400 mg by mouth in the morning and at bedtime.    [provider]  Cholecalciferol (VITAMIN D3) 1000 units CAPS Take 1,000 Units by mouth daily.    [provider]  clotrimazole-betamethasone (LOTRISONE) cream APPLY TO THE AFFECTED AREA(S) TWICE DAILY 05/09/23   McKenzie, Mardene Celeste, MD  cyanocobalamin 100 MCG tablet Take 1,000 mcg by mouth daily.    [provider]  dextromethorphan-guaiFENesin (MUCINEX DM) 30-600 MG 12hr tablet Take 1 tablet by mouth daily as needed for cough.    [provider]  enzalutamide Diana Eves) 80 MG tablet Take 80 mg by mouth daily.    [provider]  ferrous sulfate 325 (65 FE) MG tablet Take 1 tablet (325 mg total) by mouth 2 (two) times daily with a meal. Mon, wed, fri Patient taking differently: Take 325 mg by mouth 2 (two) times daily. 08/15/22   Johnson, Clanford L, MD  finasteride (PROSCAR) 5 MG tablet Take 1 tablet (5 mg total) by mouth daily. 02/23/23   McKenzie, Mardene Celeste, MD  furosemide (LASIX) 20 MG tablet Take 1 tablet (20 mg total) by mouth every other day. Tuesday, Thursday and Saturday 08/15/22 04/16/23  Cleora Fleet, MD  loperamide (IMODIUM) 2 MG capsule Take 2 mg by mouth as needed. Once daily as needed    [provider]  magic mouthwash (  nystatin, lidocaine, diphenhydrAMINE, alum & mag hydroxide) suspension Swish and spit 5 mLs 4 (four) times daily as needed for mouth pain. 11/28/22   Pokhrel, Rebekah Chesterfield, MD  pantoprazole (PROTONIX) 40 MG tablet Take 1 tablet (40 mg total) by mouth 2 (two) times daily. Patient taking differently: Take 40 mg by mouth daily. 08/15/22 08/15/23  Johnson, Clanford L, MD  pregabalin (LYRICA) 75 MG capsule Take 1 capsule (75 mg total) by mouth 2 (two) times daily. 07/09/22   Sharee Holster, NP  sulfamethoxazole-trimethoprim (BACTRIM DS) 800-160 MG tablet Take 1 tablet by mouth 2 (two) times daily. 04/17/23   McKenzie,  Mardene Celeste, MD  topiramate (TOPAMAX) 50 MG tablet Take 0.5 tablets (25 mg total) by mouth 2 (two) times daily. Patient taking differently: Take 50 mg by mouth 2 (two) times daily. 07/09/22   Sharee Holster, NP  vitamin C (ASCORBIC ACID) 250 MG tablet Take 500 mg by mouth daily.    [provider]  zolpidem (AMBIEN) 5 MG tablet Take 5-10 mg by mouth at bedtime as needed for sleep. 07/09/22   [provider]      Allergies    Iodine, Calan [verapamil], Cymbalta [duloxetine hcl], Iodinated contrast media, Latex, Lisinopril, Neurontin [gabapentin], Zoloft [sertraline hcl], Valium [diazepam], Flomax [tamsulosin hcl], Glimepiride, Melatonin, and Tape    Review of Systems   Review of Systems  Unable to perform ROS: Patient nonverbal    Physical Exam Updated Vital Signs BP (!) 151/73 (BP Location: Right Arm)   Pulse 68   Temp (!) 97.4 F (36.3 C) (Oral)   Resp 16   Ht 6\' 1"  (1.854 m)   Wt 73.1 kg   SpO2 95%   BMI 21.26 kg/m  Physical Exam Vitals and nursing note reviewed. Exam conducted with a chaperone present.  Constitutional:      Comments: Frail-appearing  HENT:     Head: Atraumatic.     Mouth/Throat:     Mouth: Mucous membranes are moist.  Eyes:     Extraocular Movements: Extraocular movements intact.     Conjunctiva/sclera: Conjunctivae normal.     Pupils: Pupils are equal, round, and reactive to light.  Cardiovascular:     Rate and Rhythm: Normal rate and regular rhythm.  Pulmonary:     Effort: Pulmonary effort is normal. No respiratory distress.     Breath sounds: Rhonchi present. No wheezing.     Comments: Coarse rhonchi bilaterally, patient actively coughing.  No increased work of breathing Abdominal:     Palpations: Abdomen is soft.     Tenderness: There is no abdominal tenderness.  Genitourinary:    Comments: Patient has 1/2 inch sacral decubitus.  No significant surrounding erythema, no purulent drainage noted. Musculoskeletal:     Right lower  leg: No edema.     Left lower leg: No edema.  Skin:    General: Skin is warm.     Capillary Refill: Capillary refill takes less than 2 seconds.  Neurological:     General: No focal deficit present.     Mental Status: He is alert.     Sensory: No sensory deficit.     Motor: No weakness.     ED Results / Procedures / Treatments   Labs (all labs ordered are listed, but only abnormal results are displayed) Labs Reviewed  CBC WITH DIFFERENTIAL/PLATELET - Abnormal; Notable for the following components:      Result Value   RBC 6.34 (*)    Hemoglobin 18.2 (*)  HCT 60.4 (*)    Platelets 101 (*)    Lymphs Abs 0.5 (*)    All other components within normal limits  URINALYSIS, ROUTINE W REFLEX MICROSCOPIC - Abnormal; Notable for the following components:   APPearance HAZY (*)    Hgb urine dipstick SMALL (*)    Ketones, ur 5 (*)    Protein, ur 100 (*)    Nitrite POSITIVE (*)    Leukocytes,Ua LARGE (*)    Bacteria, UA RARE (*)    All other components within normal limits  COMPREHENSIVE METABOLIC PANEL - Abnormal; Notable for the following components:   Creatinine, Ser 0.53 (*)    Albumin 2.7 (*)    AST 11 (*)    All other components within normal limits  CULTURE, BLOOD (ROUTINE X 2)  CULTURE, BLOOD (ROUTINE X 2)  URINE CULTURE  LACTIC ACID, PLASMA  PROTIME-INR  APTT    EKG EKG Interpretation Date/Time:  Monday June 01 2023 15:16:08 EDT Ventricular Rate:  69 PR Interval:  185 QRS Duration:  89 QT Interval:  450 QTC Calculation: 483 R Axis:   -45  Text Interpretation: Unknown rhythm, irregular rate Abnormal R-wave progression, early transition Inferior infarct, old Consider anterior infarct Confirmed by Bethann Berkshire [phone]) on 06/01/2023 7:26:48 PM  Radiology DG Chest Port 1 View  Result Date: 06/01/2023 CLINICAL DATA:  Sepsis EXAM: PORTABLE CHEST 1 VIEW COMPARISON:  X-ray 03/25/2023 FINDINGS: Small left effusion with some adjacent opacity. No consolidation or  pneumothorax. Right lung is clear. Normal cardiopericardial silhouette. Calcified and tortuous aorta. Film is rotated to the left. Overlapping artifacts along the upper left hemithorax. Surgical clips in the upper abdomen. IMPRESSION: Small left pleural effusion.  Increased from previous. Electronically Signed   By: Karen Kays M.D.   On: 06/01/2023 15:27    Procedures Procedures  {Document cardiac monitor, telemetry assessment procedure when appropriate:1}  Medications Ordered in ED Medications  lactated ringers bolus 500 mL (has no administration in time range)    ED Course/ Medical Decision Making/ A&P   {   Click here for ABCD2, HEART and other calculatorsREFRESH Note before signing :1}                          Medical Decision Making Patient here from home home for evaluation of increased somnolence decreased appetite and possible UTI with cough.  Also has a sacral decubitus that home health has been caring for her.  Family is concerned that he may have a UTI.  Patient is mostly nonverbal at baseline  Differential would include but not limited to sepsis, UTI, pneumonia, CHF exacerbation, infection of sacral decubitus.  Clinically, I have high suspicion for developing sepsis.  Will initiate labs, fluids and cultures of urine and blood  Patient does have sacral decubitus but wound looks good, no surrounding erythema crepitus or induration.  Will have nursing staff remove Foley catheter and replace and obtain urine culture from newly placed catheter  On review of medical records, patient has had UTI in the past, in April of this year grew Klebsiella that was sensitive to ceftriaxone.  Amount and/or Complexity of Data Reviewed Labs: ordered.    Details: Labs interpreted by me, no evidence of leukocytosis, chemistries without significant derangement, lactic acid unremarkable, urinalysis does show hazy urine nitrite positive with large leukocytes and 21-50 white cells, urine and blood  cultures are pending Radiology: ordered.    Details: Chest x-ray shows small left pleural  effusion increased from previous ECG/medicine tests: ordered. Discussion of management or test interpretation with external provider(s): Patient given Rocephin here, workup shows likely UTI as source of patient's increased somnolence and decreased appetite.  Consult hospitalist for admission  It was felt that patient is appropriate for discharge home and treatment with oral antibiotics.  His cultures are pending, biotics given here and prescription written for cephalexin.  Spoke with patient's daughter who is caregiver and she is agreeable for home treatment.     {Document critical care time when appropriate:1} {Document review of labs and clinical decision tools ie heart score, Chads2Vasc2 etc:1}  {Document your independent review of radiology images, and any outside records:1} {Document your discussion with family members, caretakers, and with consultants:1} {Document social determinants of health affecting pt's care:1} {Document your decision making why or why not admission, treatments were needed:1} Final Clinical Impression(s) / ED Diagnoses Final diagnoses:  None    Rx / DC Orders ED Discharge Orders     None

## 2023-06-01 NOTE — ED Notes (Signed)
Notified [place] [facility] of patient needing transportation back home.  

## 2023-06-01 NOTE — ED Triage Notes (Signed)
Pt brought in for possible UTI.  PT has a foley and urine has lots of sediment.

## 2023-06-02 ENCOUNTER — Inpatient Hospital Stay (HOSPITAL_COMMUNITY): Payer: No Typology Code available for payment source

## 2023-06-02 ENCOUNTER — Other Ambulatory Visit: Payer: Self-pay

## 2023-06-02 ENCOUNTER — Encounter (HOSPITAL_COMMUNITY): Payer: Self-pay | Admitting: *Deleted

## 2023-06-02 ENCOUNTER — Inpatient Hospital Stay (HOSPITAL_COMMUNITY)
Admission: EM | Admit: 2023-06-02 | Discharge: 2023-06-04 | DRG: 698 | Disposition: A | Discharge status: Home Health | Payer: No Typology Code available for payment source | Attending: Family Medicine | Admitting: Family Medicine

## 2023-06-02 DIAGNOSIS — N401 Enlarged prostate with lower urinary tract symptoms: Secondary | ICD-10-CM | POA: Diagnosis present

## 2023-06-02 DIAGNOSIS — E7800 Pure hypercholesterolemia, unspecified: Secondary | ICD-10-CM | POA: Diagnosis present

## 2023-06-02 DIAGNOSIS — Z87891 Personal history of nicotine dependence: Secondary | ICD-10-CM

## 2023-06-02 DIAGNOSIS — Z888 Allergy status to other drugs, medicaments and biological substances status: Secondary | ICD-10-CM

## 2023-06-02 DIAGNOSIS — F0153 Vascular dementia, unspecified severity, with mood disturbance: Secondary | ICD-10-CM | POA: Diagnosis present

## 2023-06-02 DIAGNOSIS — E876 Hypokalemia: Secondary | ICD-10-CM

## 2023-06-02 DIAGNOSIS — F0154 Vascular dementia, unspecified severity, with anxiety: Secondary | ICD-10-CM | POA: Diagnosis present

## 2023-06-02 DIAGNOSIS — E1165 Type 2 diabetes mellitus with hyperglycemia: Secondary | ICD-10-CM | POA: Diagnosis present

## 2023-06-02 DIAGNOSIS — N390 Urinary tract infection, site not specified: Secondary | ICD-10-CM | POA: Diagnosis present

## 2023-06-02 DIAGNOSIS — Z981 Arthrodesis status: Secondary | ICD-10-CM | POA: Diagnosis not present

## 2023-06-02 DIAGNOSIS — R7881 Bacteremia: Secondary | ICD-10-CM | POA: Diagnosis present

## 2023-06-02 DIAGNOSIS — Z8546 Personal history of malignant neoplasm of prostate: Secondary | ICD-10-CM

## 2023-06-02 DIAGNOSIS — D500 Iron deficiency anemia secondary to blood loss (chronic): Secondary | ICD-10-CM | POA: Diagnosis present

## 2023-06-02 DIAGNOSIS — R4182 Altered mental status, unspecified: Secondary | ICD-10-CM

## 2023-06-02 DIAGNOSIS — Z8249 Family history of ischemic heart disease and other diseases of the circulatory system: Secondary | ICD-10-CM

## 2023-06-02 DIAGNOSIS — D472 Monoclonal gammopathy: Secondary | ICD-10-CM | POA: Diagnosis present

## 2023-06-02 DIAGNOSIS — G8250 Quadriplegia, unspecified: Secondary | ICD-10-CM | POA: Diagnosis present

## 2023-06-02 DIAGNOSIS — K219 Gastro-esophageal reflux disease without esophagitis: Secondary | ICD-10-CM | POA: Diagnosis present

## 2023-06-02 DIAGNOSIS — Z79899 Other long term (current) drug therapy: Secondary | ICD-10-CM

## 2023-06-02 DIAGNOSIS — B961 Klebsiella pneumoniae [K. pneumoniae] as the cause of diseases classified elsewhere: Secondary | ICD-10-CM | POA: Diagnosis present

## 2023-06-02 DIAGNOSIS — Y846 Urinary catheterization as the cause of abnormal reaction of the patient, or of later complication, without mention of misadventure at the time of the procedure: Secondary | ICD-10-CM | POA: Diagnosis present

## 2023-06-02 DIAGNOSIS — T83518A Infection and inflammatory reaction due to other urinary catheter, initial encounter: Secondary | ICD-10-CM | POA: Diagnosis present

## 2023-06-02 DIAGNOSIS — I69318 Other symptoms and signs involving cognitive functions following cerebral infarction: Secondary | ICD-10-CM

## 2023-06-02 DIAGNOSIS — Z91041 Radiographic dye allergy status: Secondary | ICD-10-CM

## 2023-06-02 DIAGNOSIS — F419 Anxiety disorder, unspecified: Secondary | ICD-10-CM | POA: Diagnosis present

## 2023-06-02 DIAGNOSIS — M353 Polymyalgia rheumatica: Secondary | ICD-10-CM | POA: Diagnosis present

## 2023-06-02 DIAGNOSIS — Z91040 Latex allergy status: Secondary | ICD-10-CM

## 2023-06-02 DIAGNOSIS — E559 Vitamin D deficiency, unspecified: Secondary | ICD-10-CM | POA: Diagnosis present

## 2023-06-02 DIAGNOSIS — N138 Other obstructive and reflux uropathy: Secondary | ICD-10-CM | POA: Diagnosis present

## 2023-06-02 DIAGNOSIS — G9341 Metabolic encephalopathy: Secondary | ICD-10-CM | POA: Diagnosis present

## 2023-06-02 DIAGNOSIS — F339 Major depressive disorder, recurrent, unspecified: Secondary | ICD-10-CM | POA: Diagnosis present

## 2023-06-02 DIAGNOSIS — N3001 Acute cystitis with hematuria: Secondary | ICD-10-CM

## 2023-06-02 DIAGNOSIS — E538 Deficiency of other specified B group vitamins: Secondary | ICD-10-CM | POA: Diagnosis present

## 2023-06-02 DIAGNOSIS — I440 Atrioventricular block, first degree: Secondary | ICD-10-CM | POA: Diagnosis present

## 2023-06-02 DIAGNOSIS — Z818 Family history of other mental and behavioral disorders: Secondary | ICD-10-CM

## 2023-06-02 DIAGNOSIS — N319 Neuromuscular dysfunction of bladder, unspecified: Secondary | ICD-10-CM | POA: Diagnosis present

## 2023-06-02 DIAGNOSIS — Z8711 Personal history of peptic ulcer disease: Secondary | ICD-10-CM

## 2023-06-02 DIAGNOSIS — I5032 Chronic diastolic (congestive) heart failure: Secondary | ICD-10-CM | POA: Diagnosis present

## 2023-06-02 DIAGNOSIS — I110 Hypertensive heart disease with heart failure: Secondary | ICD-10-CM | POA: Diagnosis present

## 2023-06-02 DIAGNOSIS — Z96652 Presence of left artificial knee joint: Secondary | ICD-10-CM | POA: Diagnosis present

## 2023-06-02 DIAGNOSIS — I6529 Occlusion and stenosis of unspecified carotid artery: Secondary | ICD-10-CM | POA: Diagnosis present

## 2023-06-02 DIAGNOSIS — I10 Essential (primary) hypertension: Secondary | ICD-10-CM | POA: Diagnosis present

## 2023-06-02 LAB — BLOOD CULTURE ID PANEL (REFLEXED) - BCID2

## 2023-06-02 LAB — CBC WITH DIFFERENTIAL/PLATELET
Abs Immature Granulocytes: 0.08 10*3/uL — ABNORMAL HIGH (ref 0.00–0.07)
Basophils Absolute: 0 10*3/uL (ref 0.0–0.1)
Basophils Relative: 0 %
Eosinophils Absolute: 0.1 10*3/uL (ref 0.0–0.5)
Eosinophils Relative: 1 %
HCT: 34.9 % — ABNORMAL LOW (ref 39.0–52.0)
Hemoglobin: 10.8 g/dL — ABNORMAL LOW (ref 13.0–17.0)
Immature Granulocytes: 1 %
Lymphocytes Relative: 7 %
Lymphs Abs: 0.7 10*3/uL (ref 0.7–4.0)
MCH: 28.6 pg (ref 26.0–34.0)
MCHC: 30.9 g/dL (ref 30.0–36.0)
MCV: 92.6 fL (ref 80.0–100.0)
Monocytes Absolute: 1 10*3/uL (ref 0.1–1.0)
Monocytes Relative: 9 %
Neutro Abs: 8.3 10*3/uL — ABNORMAL HIGH (ref 1.7–7.7)
Neutrophils Relative %: 82 %
Platelets: 259 10*3/uL (ref 150–400)
RBC: 3.77 MIL/uL — ABNORMAL LOW (ref 4.22–5.81)
RDW: 14.8 % (ref 11.5–15.5)
WBC: 10.2 10*3/uL (ref 4.0–10.5)
nRBC: 0 % (ref 0.0–0.2)

## 2023-06-02 LAB — BASIC METABOLIC PANEL
Anion gap: 8 (ref 5–15)
BUN: 15 mg/dL (ref 8–23)
CO2: 27 mmol/L (ref 22–32)
Calcium: 9.1 mg/dL (ref 8.9–10.3)
Chloride: 101 mmol/L (ref 98–111)
Creatinine, Ser: 0.71 mg/dL (ref 0.61–1.24)
GFR, Estimated: >60 mL/min (ref >60)
Glucose, Bld: 145 mg/dL — ABNORMAL HIGH (ref 70–99)
Potassium: 3.2 mmol/L — ABNORMAL LOW (ref 3.5–5.1)
Sodium: 136 mmol/L (ref 135–145)

## 2023-06-02 LAB — CULTURE, BLOOD (ROUTINE X 2)

## 2023-06-02 LAB — GLUCOSE, CAPILLARY: Glucose-Capillary: 105 mg/dL — ABNORMAL HIGH (ref 70–99)

## 2023-06-02 MED ORDER — INSULIN ASPART 100 UNIT/ML IJ SOLN
0.0000 [IU] | Freq: Three times a day (TID) | INTRAMUSCULAR | Status: DC
  Administered 2023-06-03: 1 [IU] via SUBCUTANEOUS

## 2023-06-02 MED ORDER — VANCOMYCIN HCL 1750 MG/350ML IV SOLN
1750.0000 mg | Freq: Once | INTRAVENOUS | Status: AC
  Administered 2023-06-02: 1750 mg via INTRAVENOUS
  Filled 2023-06-02: qty 350

## 2023-06-02 MED ORDER — VITAMIN D 25 MCG (1000 UNIT) PO TABS
1000.0000 [IU] | ORAL_TABLET | Freq: Every day | ORAL | Status: DC
  Administered 2023-06-03 – 2023-06-04 (×2): 1000 [IU] via ORAL
  Filled 2023-06-02 (×2): qty 1

## 2023-06-02 MED ORDER — ALBUTEROL SULFATE (2.5 MG/3ML) 0.083% IN NEBU: 2.5000 mg | INHALATION_SOLUTION | Freq: Four times a day (QID) | RESPIRATORY_TRACT | Status: DC | PRN

## 2023-06-02 MED ORDER — SODIUM CHLORIDE 0.9 % IV SOLN
1.0000 g | Freq: Every day | INTRAVENOUS | Status: DC
  Administered 2023-06-03: 1 g via INTRAVENOUS
  Filled 2023-06-02: qty 10

## 2023-06-02 MED ORDER — DM-GUAIFENESIN ER 30-600 MG PO TB12: 1.0000 | ORAL_TABLET | Freq: Every day | ORAL | Status: DC | PRN

## 2023-06-02 MED ORDER — ENOXAPARIN SODIUM 40 MG/0.4ML IJ SOSY
40.0000 mg | PREFILLED_SYRINGE | INTRAMUSCULAR | Status: DC
  Administered 2023-06-02 – 2023-06-03 (×2): 40 mg via SUBCUTANEOUS
  Filled 2023-06-02 (×2): qty 0.4

## 2023-06-02 MED ORDER — FUROSEMIDE 40 MG PO TABS
20.0000 mg | ORAL_TABLET | ORAL | Status: DC
  Administered 2023-06-04: 20 mg via ORAL
  Filled 2023-06-02: qty 1

## 2023-06-02 MED ORDER — VITAMIN C 500 MG PO TABS
500.0000 mg | ORAL_TABLET | Freq: Every day | ORAL | Status: DC
  Administered 2023-06-03 – 2023-06-04 (×2): 500 mg via ORAL
  Filled 2023-06-02 (×2): qty 1

## 2023-06-02 MED ORDER — VANCOMYCIN HCL 1250 MG/250ML IV SOLN
1250.0000 mg | INTRAVENOUS | Status: DC
  Administered 2023-06-03: 1250 mg via INTRAVENOUS
  Filled 2023-06-02: qty 250

## 2023-06-02 MED ORDER — ACETAMINOPHEN 650 MG RE SUPP: 650.0000 mg | Freq: Four times a day (QID) | RECTAL | Status: DC | PRN

## 2023-06-02 MED ORDER — LOPERAMIDE HCL 2 MG PO CAPS: 2.0000 mg | ORAL_CAPSULE | ORAL | Status: DC | PRN

## 2023-06-02 MED ORDER — FINASTERIDE 5 MG PO TABS
5.0000 mg | ORAL_TABLET | Freq: Every day | ORAL | Status: DC
  Administered 2023-06-03 – 2023-06-04 (×2): 5 mg via ORAL
  Filled 2023-06-02 (×2): qty 1

## 2023-06-02 MED ORDER — INSULIN ASPART 100 UNIT/ML IJ SOLN: 0.0000 [IU] | Freq: Every day | INTRAMUSCULAR | Status: DC

## 2023-06-02 MED ORDER — ACETAMINOPHEN 325 MG PO TABS
650.0000 mg | ORAL_TABLET | Freq: Four times a day (QID) | ORAL | Status: DC | PRN
  Administered 2023-06-03: 650 mg via ORAL
  Filled 2023-06-02: qty 2

## 2023-06-02 MED ORDER — ATORVASTATIN CALCIUM 40 MG PO TABS
40.0000 mg | ORAL_TABLET | Freq: Every day | ORAL | Status: DC
  Administered 2023-06-03 – 2023-06-04 (×2): 40 mg via ORAL
  Filled 2023-06-02 (×2): qty 1

## 2023-06-02 MED ORDER — ALBUTEROL SULFATE HFA 108 (90 BASE) MCG/ACT IN AERS: 2.0000 | INHALATION_SPRAY | Freq: Four times a day (QID) | RESPIRATORY_TRACT | Status: DC | PRN

## 2023-06-02 MED ORDER — CALCIUM CARBONATE 1250 (500 CA) MG PO TABS
1.0000 | ORAL_TABLET | Freq: Every day | ORAL | Status: DC
  Administered 2023-06-03 – 2023-06-04 (×2): 1250 mg via ORAL
  Filled 2023-06-02 (×2): qty 1

## 2023-06-02 MED ORDER — PANTOPRAZOLE SODIUM 40 MG PO TBEC
40.0000 mg | DELAYED_RELEASE_TABLET | Freq: Every day | ORAL | Status: DC
  Administered 2023-06-03 – 2023-06-04 (×2): 40 mg via ORAL
  Filled 2023-06-02 (×2): qty 1

## 2023-06-02 MED ORDER — ALFUZOSIN HCL ER 10 MG PO TB24
10.0000 mg | ORAL_TABLET | Freq: Every day | ORAL | Status: DC
  Administered 2023-06-03 – 2023-06-04 (×2): 10 mg via ORAL
  Filled 2023-06-02 (×2): qty 1

## 2023-06-02 MED ORDER — FERROUS SULFATE 325 (65 FE) MG PO TABS
325.0000 mg | ORAL_TABLET | Freq: Two times a day (BID) | ORAL | Status: DC
  Administered 2023-06-02 – 2023-06-04 (×4): 325 mg via ORAL
  Filled 2023-06-02 (×4): qty 1

## 2023-06-02 MED ORDER — HYDRALAZINE HCL 20 MG/ML IJ SOLN: 10.0000 mg | Freq: Four times a day (QID) | INTRAMUSCULAR | Status: DC | PRN

## 2023-06-02 MED ORDER — POTASSIUM CHLORIDE 10 MEQ/100ML IV SOLN
10.0000 meq | INTRAVENOUS | Status: AC
  Administered 2023-06-02 – 2023-06-03 (×2): 10 meq via INTRAVENOUS
  Filled 2023-06-02 (×2): qty 100

## 2023-06-02 MED ORDER — VITAMIN B-12 1000 MCG PO TABS
1000.0000 ug | ORAL_TABLET | Freq: Every day | ORAL | Status: DC
  Administered 2023-06-03 – 2023-06-04 (×2): 1000 ug via ORAL
  Filled 2023-06-02 (×2): qty 1

## 2023-06-02 MED ORDER — ZOLPIDEM TARTRATE 5 MG PO TABS
5.0000 mg | ORAL_TABLET | Freq: Every evening | ORAL | Status: DC | PRN
  Administered 2023-06-03: 5 mg via ORAL
  Filled 2023-06-02: qty 1

## 2023-06-02 MED ORDER — ONDANSETRON HCL 4 MG/2ML IJ SOLN: 4.0000 mg | Freq: Four times a day (QID) | INTRAMUSCULAR | Status: DC | PRN

## 2023-06-02 MED ORDER — ONDANSETRON HCL 4 MG PO TABS: 4.0000 mg | ORAL_TABLET | Freq: Four times a day (QID) | ORAL | Status: DC | PRN

## 2023-06-02 MED ORDER — SODIUM CHLORIDE 0.9 % IV SOLN
1.0000 g | Freq: Once | INTRAVENOUS | Status: AC
  Administered 2023-06-02: 1 g via INTRAVENOUS
  Filled 2023-06-02: qty 10

## 2023-06-02 NOTE — ED Notes (Signed)
Called daughter to make sure she was home and ready for patient. EMS has arrived to transport patients.

## 2023-06-02 NOTE — ED Notes (Signed)
Dr. Jearld Fenton notified of current CBC results, change from yesterday. Verbalized understanding. No new orders.

## 2023-06-02 NOTE — Progress Notes (Signed)
Pharmacy Antibiotic Note  Jared Bridges is a 89 y.o. male admitted on 06/02/2023 with bacteremia.  Pharmacy has been consulted for vancomycin dosing.  Plan: Vancomycin 1750 mg IV x 1 dose. Vancomycin 1250 mg IV every 24 hours. Monitor labs, c/s, and vanco levels as indicated.  Height: 6\' 1"  (185.4 cm) Weight: 73.1 kg (161 lb 2.5 oz) IBW/kg (Calculated) : 79.9  Temp (24hrs), Avg:98.2 F (36.8 C), Min:98 F (36.7 C), Max:98.4 F (36.9 C)  Recent Labs  Lab 06/01/23 1407 06/01/23 1513 06/02/23 1630 06/02/23 1754  WBC 7.0  --   --  10.2  CREATININE 0.53*  --  0.71  --   LATICACIDVEN  --  1.6  --   --     Estimated Creatinine Clearance: 63.5 mL/min (by C-G formula based on SCr of 0.71 mg/dL).    Allergies  Allergen Reactions   Iodine Hives, Itching and Other (See Comments)    very allergic(per daughter), can pre-med with benadryl   Calan [Verapamil] Other (See Comments)    Weakness   Cymbalta [Duloxetine Hcl] Other (See Comments)    Makes patient have jerking motions.   Iodinated Contrast Media Hives, Itching and Other (See Comments)    Can pre-med with benadryl   Latex Other (See Comments)    Redness iritation   Lisinopril Other (See Comments)    Makes patient have jerking motions.   Neurontin [Gabapentin] Other (See Comments)    Makes patient have jerking motions.   Zoloft [Sertraline Hcl] Diarrhea and Other (See Comments)    Makes patient have jerking motions.   Valium [Diazepam] Other (See Comments)    "Did not work"   Flomax [Tamsulosin Hcl] Other (See Comments)    Dizziness   Glimepiride Other (See Comments)    "Did not work"   Melatonin Nausea Only   Tape Other (See Comments)    Skin irritation    Antimicrobials this admission: Vanco 7/2 >> CTX 7/1 >>  Microbiology results: 7/2 BCx: pending 7/1 Bcx: gram + cocci 4/4 bottles BCID: staph species 7/1 UCx: >100k gram neg rods   Thank you for allowing pharmacy to be a part of this patient's  care.  Judeth Cornfield, PharmD Clinical Pharmacist 06/02/2023 8:04 PM

## 2023-06-02 NOTE — ED Triage Notes (Signed)
Pt BIB [facility] for positive blood cultures; pt was seen here yesterday and diagnosed with a UTI; pt had blood cultures drawn and lab called and stated 3 of the 4 blood culture bottles were positive

## 2023-06-02 NOTE — H&P (Signed)
History and Physical    Patient: Jared Bridges ZOX:[id number] DOB: [birth year] DOA: 06/02/2023 DOS: the patient was seen and examined on 06/02/2023 PCP: Dorothey Baseman, MD  Patient coming from: Home  Chief Complaint:  Chief Complaint  Patient presents with   Abnormal Lab   HPI: Jared Bridges is a 89 y.o. male with medical history significant of iron deficiency anemia, diverticulitis, hypertension, hyperlipidemia, peptic ulcer disease, polymyalgia rheumatica, thyroid disease, TIA, type 2 diabetes mellitus, and upper GI bleed ,hx of prostate cancer (on Xtandi and Lupron), Mgus, idiopathic neuropathy  hx of spinal cord injury C7 with neurogenic bladder, with limited movement of the left arm, chronic indwelling foley , just changed yesterday presents with acute lower UTI ,   Per his daughter, has not been acting quite right for the past week.  She suspected UTI and therefore brought him to ED yesterday. Where he was diagnosed with UTI.  Pt went home and returns today due to blood culture + for gram positive cocci.   Pt appears confused at this time, slow to respond to questions. Pt will be admitted for acute lower UTI, and bacteremia, and AMS   Review of Systems: As mentioned in the history of present illness. All other systems reviewed and are negative. Past Medical History:  Diagnosis Date   Acute cholecystitis 04/05/2016   Anemia, iron deficiency    Chronic headaches    Migraines   Chronic kidney disease    Resolved   Depression    Diverticulitis    Essential hypertension    GI bleed    Dr. Karilyn Cota - 1998   Hypercholesteremia    Orthostatic hypotension    Peptic ulcer disease    Peripheral neuropathy    Polymyalgia rheumatica ([name]    Prostate cancer ([name]    Rectal bleeding 11/26/2017   Added automatically from request for surgery [id number]   Stroke [facility])    Residual trouble reading and writing   Syncope    Thyroid nodule    TIA (transient ischemic attack) 12/2017   Type 2  diabetes mellitus ([name]    Upper GI bleed 06/18/2012   Past Surgical History:  Procedure Laterality Date   APPENDECTOMY     as child   BACK SURGERY     2000 IDET SPINAL PROC   CHOLECYSTECTOMY N/A 04/07/2016   Procedure: CHOLECYSTECTOMY;  Surgeon: Franky Macho, MD;  Location: [facility];  Service: General;  Laterality: N/A;   COLON SURGERY     FOR DIVERTICULOSIS   1988   COLONOSCOPY N/A 01/07/2018   Procedure: COLONOSCOPY;  Surgeon: Malissa Hippo, MD;  Location: [facility];  Service: Endoscopy;  Laterality: N/A;  1:00   COLONOSCOPY WITH PROPOFOL N/A 08/12/2022   Procedure: COLONOSCOPY WITH PROPOFOL;  Surgeon: Dolores Frame, MD;  Location: [facility];  Service: Gastroenterology;  Laterality: N/A;  ASA 3   CRYOTHERAPY     ESOPHAGOGASTRODUODENOSCOPY (EGD) WITH PROPOFOL N/A 08/09/2022   Procedure: ESOPHAGOGASTRODUODENOSCOPY (EGD) WITH PROPOFOL;  Surgeon: Dolores Frame, MD;  Location: [facility];  Service: Gastroenterology;  Laterality: N/A;   EYE SURGERY     right cataract with lens implant   FEMUR SURGERY     RT LEG        1953   GIVENS CAPSULE STUDY N/A 08/13/2022   Procedure: GIVENS CAPSULE STUDY;  Surgeon: Dolores Frame, MD;  Location: [facility];  Service: Gastroenterology;  Laterality: N/A;   HAND SURGERY  RIGHT   HERNIA REPAIR     1-right inguinal, 3- left inguinal   IRRIGATION AND DEBRIDEMENT KNEE Left 07/09/2015   Procedure: LEFT KNEE IRRIGATION AND DEBRIDEMENT WOUND CLOSURE;  Surgeon: Ollen Gross, MD;  Location: [facility];  Service: Orthopedics;  Laterality: Left;   JOINT REPLACEMENT  02/28/2016   lt knee revision   KNEE DEBRIDEMENT     2016   LEFT   MENISCUS REPAIR     LEFT   POLYPECTOMY  01/07/2018   Procedure: POLYPECTOMY;  Surgeon: Malissa Hippo, MD;  Location: [facility];  Service: Endoscopy;;  colon    POLYPECTOMY  08/12/2022   Procedure: POLYPECTOMY;  Surgeon: Dolores Frame, MD;  Location: [facility];   Service: Gastroenterology;;   SHOULDER OPEN ROTATOR CUFF REPAIR     RT SHOULDER   SPINAL FUSION     SPINAL FUSION     TONSILLECTOMY     TOTAL KNEE ARTHROPLASTY Left 05/28/2015   Procedure: LEFT TOTAL KNEE ARTHROPLASTY;  Surgeon: Ollen Gross, MD;  Location: [facility];  Service: Orthopedics;  Laterality: Left;   TOTAL KNEE REVISION Left 02/28/2016   Procedure: LEFT TOTAL KNEE REVISION;  Surgeon: Tarry Kos, MD;  Location: [facility];  Service: Orthopedics;  Laterality: Left;   TRIGGER FINGER RELEASE     RT HAND   1990S   UVULECTOMY  2004   TURBINATE,TONSIL,ADENOID   Social History:  reports that he quit smoking about 36 years ago. His smoking use included cigars and cigarettes. He started smoking about 64 years ago. He has a 1.80 pack-year smoking history. He has never used smokeless tobacco. He reports that he does not drink alcohol and does not use drugs.  Allergies  Allergen Reactions   Iodine Hives, Itching and Other (See Comments)    very allergic(per daughter), can pre-med with benadryl   Calan [Verapamil] Other (See Comments)    Weakness   Cymbalta [Duloxetine Hcl] Other (See Comments)    Makes patient have jerking motions.   Iodinated Contrast Media Hives, Itching and Other (See Comments)    Can pre-med with benadryl   Latex Other (See Comments)    Redness iritation   Lisinopril Other (See Comments)    Makes patient have jerking motions.   Neurontin [Gabapentin] Other (See Comments)    Makes patient have jerking motions.   Zoloft [Sertraline Hcl] Diarrhea and Other (See Comments)    Makes patient have jerking motions.   Valium [Diazepam] Other (See Comments)    "Did not work"   Flomax [Tamsulosin Hcl] Other (See Comments)    Dizziness   Glimepiride Other (See Comments)    "Did not work"   Melatonin Nausea Only   Tape Other (See Comments)    Skin irritation    Family History  Problem Relation Age of Onset   Colon cancer Sister    Hypertension Other    Depression  Brother    Depression Daughter     Prior to Admission medications   Medication Sig Start Date End Date Taking? Authorizing Provider  albuterol (VENTOLIN HFA) 108 (90 Base) MCG/ACT inhaler Inhale 2 puffs into the lungs every 6 (six) hours as needed for wheezing or shortness of breath. 01/03/23   Briant Cedar, MD  alfuzosin (UROXATRAL) 10 MG 24 hr tablet Take 1 tablet (10 mg total) by mouth daily with breakfast. 02/23/23   McKenzie, Mardene Celeste, MD  Apremilast (OTEZLA) 30 MG TABS Take 30 mg by mouth 2 (two) times daily.  [provider]  atorvastatin (LIPITOR) 40 MG tablet Take 1 tablet (40 mg total) by mouth daily. 07/09/22   Sharee Holster, NP  Calcium 200 MG TABS Take 400 mg by mouth in the morning and at bedtime.    [provider]  cephALEXin (KEFLEX) 500 MG capsule Take 1 capsule (500 mg total) by mouth 4 (four) times daily. 06/01/23   Triplett, Tammy, PA-C  Cholecalciferol (VITAMIN D3) 1000 units CAPS Take 1,000 Units by mouth daily.    [provider]  clotrimazole-betamethasone (LOTRISONE) cream APPLY TO THE AFFECTED AREA(S) TWICE DAILY 05/09/23   McKenzie, Mardene Celeste, MD  cyanocobalamin 100 MCG tablet Take 1,000 mcg by mouth daily.    [provider]  dextromethorphan-guaiFENesin (MUCINEX DM) 30-600 MG 12hr tablet Take 1 tablet by mouth daily as needed for cough.    [provider]  enzalutamide Diana Eves) 80 MG tablet Take 80 mg by mouth daily.    [provider]  ferrous sulfate 325 (65 FE) MG tablet Take 1 tablet (325 mg total) by mouth 2 (two) times daily with a meal. Mon, wed, fri Patient taking differently: Take 325 mg by mouth 2 (two) times daily. 08/15/22   Johnson, Clanford L, MD  finasteride (PROSCAR) 5 MG tablet Take 1 tablet (5 mg total) by mouth daily. 02/23/23   McKenzie, Mardene Celeste, MD  furosemide (LASIX) 20 MG tablet Take 1 tablet (20 mg total) by mouth every other day. Tuesday, Thursday and Saturday 08/15/22 04/16/23   Cleora Fleet, MD  loperamide (IMODIUM) 2 MG capsule Take 2 mg by mouth as needed. Once daily as needed    [provider]  magic mouthwash (nystatin, lidocaine, diphenhydrAMINE, alum & mag hydroxide) suspension Swish and spit 5 mLs 4 (four) times daily as needed for mouth pain. 11/28/22   Pokhrel, Rebekah Chesterfield, MD  pantoprazole (PROTONIX) 40 MG tablet Take 1 tablet (40 mg total) by mouth 2 (two) times daily. Patient taking differently: Take 40 mg by mouth daily. 08/15/22 08/15/23  Johnson, Clanford L, MD  pregabalin (LYRICA) 75 MG capsule Take 1 capsule (75 mg total) by mouth 2 (two) times daily. 07/09/22   Sharee Holster, NP  sulfamethoxazole-trimethoprim (BACTRIM DS) 800-160 MG tablet Take 1 tablet by mouth 2 (two) times daily. 04/17/23   McKenzie, Mardene Celeste, MD  topiramate (TOPAMAX) 50 MG tablet Take 0.5 tablets (25 mg total) by mouth 2 (two) times daily. Patient taking differently: Take 50 mg by mouth 2 (two) times daily. 07/09/22   Sharee Holster, NP  vitamin C (ASCORBIC ACID) 250 MG tablet Take 500 mg by mouth daily.    [provider]  zolpidem (AMBIEN) 5 MG tablet Take 5-10 mg by mouth at bedtime as needed for sleep. 07/09/22   [provider]    Physical Exam: Vitals:   06/02/23 1630 06/02/23 1745 06/02/23 1815 06/02/23 1830  BP: (!) 110/49 (!) 113/49 (!) 126/48 (!) 127/54  Pulse: 71 65 71 74  Resp: (!) 23 (!) 24 (!) 27 (!) 31  Temp:      TempSrc:      SpO2: 93% 93% 94% 93%  Weight:      Height:       Gen: elderly white male, thin, slow  Heent: anicteric Neck: no jvd Heart: rrr s1, s2,  Lung: CTAB Abd: soft, nt, nd, +bs Ext: no c/c/e Skin: no rash Lymph: no adenopathy Neuro: able to move left arm slightly , paraplegic Data Reviewed:  Assessment and  Plan:  AMS likely due to acute lower UTI Check CT brain   Acute lower UTI (complicated, has foley due to neurogenic bladder) Urine culture  not performed Blood culture + staph Vanco iv, Rocephin  iv  Bactermemia + staph, awaiting final results Abx as above  Hypokalemia Replete Check bmp in am  BPH, neurogenic bladder  Cont Proscar 5mg  po qday  Cont Alfuzosin 10mg  po qday  DM2 Fsbs ac and at bedtime, ISS  Hyperlipidemia Cont Atorvastatin 40mg  po qhs  CHF (diastolic) Cont Lasix 20mg  po qday   Iron def anemia Cont Ferrous sulfate  Vitamin D def Cont VItmain D 1000IU po qday  B12 Def Cont Vitamin B12 po qday   Gerd Cont Protonix 40mg  po qday  DVT prophylaxis: lovenox [place] FULL CODE Dispo: home,   Pt will require inpatient > 2 nites stay for bacteremia, acute lower UTI and AMS   Advance Care Planning:   Code Status: Prior FULL CODE  Consults: none  Family Communication:  with daughter, spoke to her by phone  Severity of Illness: The appropriate patient status for this patient is INPATIENT. Inpatient status is judged to be reasonable and necessary in order to provide the required intensity of service to ensure the patient's safety. The patient's presenting symptoms, physical exam findings, and initial radiographic and laboratory data in the context of their chronic comorbidities is felt to place them at high risk for further clinical deterioration. Furthermore, it is not anticipated that the patient will be medically stable for discharge from the hospital within 2 midnights of admission.   * I certify that at the point of admission it is my clinical judgment that the patient will require inpatient hospital care spanning beyond 2 midnights from the point of admission due to high intensity of service, high risk for further deterioration and high frequency of surveillance required.*  Author: Pearson Grippe, MD 06/02/2023 8:00 PM  For on call review [email or web address].

## 2023-06-02 NOTE — Telephone Encounter (Signed)
Called [facility] phone - [phone] and spoke with Mellody Dance to check status of order. He said they did receive the order and they are working on it.

## 2023-06-02 NOTE — ED Notes (Signed)
Daughter and spouse leaving to go home at this time. Phone numbers in chart if they are needed for anything.

## 2023-06-02 NOTE — ED Notes (Signed)
..ED TO INPATIENT HANDOFF REPORT  ED Nurse Name and Phone #: Dewitt Hoes RN [phone]  S Name/Age/Gender Jared Bridges 89 y.o. male Room/Bed: APA04/APA04  Code Status   Code Status: Full Code  Home/SNF/Other Home Patient oriented to: self Is this baseline? Yes   Triage Complete: Triage complete  Chief Complaint Bacteremia [R78.81]  Triage Note Pt BIB [facility] for positive blood cultures; pt was seen here yesterday and diagnosed with a UTI; pt had blood cultures drawn and lab called and stated 3 of the 4 blood culture bottles were positive    Allergies Allergies  Allergen Reactions   Iodine Hives, Itching and Other (See Comments)    very allergic(per daughter), can pre-med with benadryl   Calan [Verapamil] Other (See Comments)    Weakness   Cymbalta [Duloxetine Hcl] Other (See Comments)    Makes patient have jerking motions.   Iodinated Contrast Media Hives, Itching and Other (See Comments)    Can pre-med with benadryl   Latex Other (See Comments)    Redness iritation   Lisinopril Other (See Comments)    Makes patient have jerking motions.   Neurontin [Gabapentin] Other (See Comments)    Makes patient have jerking motions.   Zoloft [Sertraline Hcl] Diarrhea and Other (See Comments)    Makes patient have jerking motions.   Valium [Diazepam] Other (See Comments)    "Did not work"   Flomax [Tamsulosin Hcl] Other (See Comments)    Dizziness   Glimepiride Other (See Comments)    "Did not work"   Melatonin Nausea Only   Tape Other (See Comments)    Skin irritation    Level of Care/Admitting Diagnosis ED Disposition     ED Disposition  Admit   Condition  --   Comment  Hospital Area: [facility] [[id number]]  Level of Care: Med-Surg [16]  Covid Evaluation: Asymptomatic - no recent exposure (last 10 days) testing not required  Diagnosis: Bacteremia [790.7.ICD-9-CM]  Admitting Physician: Pearson Grippe [[id number]]  Attending Physician: Pearson Grippe [phone]   Certification:: I certify this patient will need inpatient services for at least 2 midnights  Estimated Length of Stay: 3          B Medical/Surgery History Past Medical History:  Diagnosis Date   Acute cholecystitis 04/05/2016   Anemia, iron deficiency    Chronic headaches    Migraines   Chronic kidney disease    Resolved   Depression    Diverticulitis    Essential hypertension    GI bleed    Dr. Karilyn Cota - 1998   Hypercholesteremia    Orthostatic hypotension    Peptic ulcer disease    Peripheral neuropathy    Polymyalgia rheumatica ([name]    Prostate cancer ([name]    Rectal bleeding 11/26/2017   Added automatically from request for surgery [id number]   Stroke [facility])    Residual trouble reading and writing   Syncope    Thyroid nodule    TIA (transient ischemic attack) 12/2017   Type 2 diabetes mellitus ([name]    Upper GI bleed 06/18/2012   Past Surgical History:  Procedure Laterality Date   APPENDECTOMY     as child   BACK SURGERY     2000 IDET SPINAL PROC   CHOLECYSTECTOMY N/A 04/07/2016   Procedure: CHOLECYSTECTOMY;  Surgeon: Franky Macho, MD;  Location: [facility];  Service: General;  Laterality: N/A;   COLON SURGERY     FOR DIVERTICULOSIS   1988   COLONOSCOPY N/A 01/07/2018  Procedure: COLONOSCOPY;  Surgeon: Malissa Hippo, MD;  [facility];  Service: Endoscopy;  Laterality: N/A;  1:00   COLONOSCOPY WITH PROPOFOL N/A 08/12/2022   Procedure: COLONOSCOPY WITH PROPOFOL;  Surgeon: Dolores Frame, MD;  [facility];  Service: Gastroenterology;  Laterality: N/A;  ASA 3   CRYOTHERAPY     ESOPHAGOGASTRODUODENOSCOPY (EGD) WITH PROPOFOL N/A 08/09/2022   Procedure: ESOPHAGOGASTRODUODENOSCOPY (EGD) WITH PROPOFOL;  Surgeon: Dolores Frame, MD;  [facility];  Service: Gastroenterology;  Laterality: N/A;   EYE SURGERY     right cataract with lens implant   FEMUR SURGERY     RT LEG        1953   GIVENS CAPSULE STUDY N/A 08/13/2022    Procedure: GIVENS CAPSULE STUDY;  Surgeon: Dolores Frame, MD;  [facility];  Service: Gastroenterology;  Laterality: N/A;   HAND SURGERY     RIGHT   HERNIA REPAIR     1-right inguinal, 3- left inguinal   IRRIGATION AND DEBRIDEMENT KNEE Left 07/09/2015   Procedure: LEFT KNEE IRRIGATION AND DEBRIDEMENT WOUND CLOSURE;  Surgeon: Ollen Gross, MD;  Location: [facility];  Service: Orthopedics;  Laterality: Left;   JOINT REPLACEMENT  02/28/2016   lt knee revision   KNEE DEBRIDEMENT     2016   LEFT   MENISCUS REPAIR     LEFT   POLYPECTOMY  01/07/2018   Procedure: POLYPECTOMY;  Surgeon: Malissa Hippo, MD;  [facility];  Service: Endoscopy;;  colon    POLYPECTOMY  08/12/2022   Procedure: POLYPECTOMY;  Surgeon: Dolores Frame, MD;  [facility];  Service: Gastroenterology;;   SHOULDER OPEN ROTATOR CUFF REPAIR     RT SHOULDER   SPINAL FUSION     SPINAL FUSION     TONSILLECTOMY     TOTAL KNEE ARTHROPLASTY Left 05/28/2015   Procedure: LEFT TOTAL KNEE ARTHROPLASTY;  Surgeon: Ollen Gross, MD;  Location: [facility];  Service: Orthopedics;  Laterality: Left;   TOTAL KNEE REVISION Left 02/28/2016   Procedure: LEFT TOTAL KNEE REVISION;  Surgeon: Tarry Kos, MD;  Location: [facility];  Service: Orthopedics;  Laterality: Left;   TRIGGER FINGER RELEASE     RT HAND   1990S   UVULECTOMY  2004   TURBINATE,TONSIL,ADENOID     A IV Location/Drains/Wounds Patient Lines/Drains/Airways Status     Active Line/Drains/Airways     Name Placement date Placement time Site Days   Peripheral IV 06/01/23 24 G Anterior;Right Wrist 06/01/23  1518  Wrist  1   Peripheral IV 06/02/23 20 G Right Forearm 06/02/23  1630  Forearm  less than 1   Urethral Catheter urologist Non-latex 16 Fr. 11/26/22  2147  Non-latex  188   Urethral Catheter Tori Martin Straight-tip 16 Fr. 03/25/23  1730  Straight-tip  69   Pressure Injury 11/27/22 Hip Left;Lateral Stage 2 -  Partial thickness  loss of dermis presenting as a shallow open injury with a red, pink wound bed without slough. 1 x 1.5 cm , red 11/27/22  1600  -- 187   Pressure Injury 11/27/22 Heel Left;Lateral Stage 2 -  Partial thickness loss of dermis presenting as a shallow open injury with a red, pink wound bed without slough. scab over stage 2     1 x 1 cm 11/27/22  1600  -- 187   Pressure Injury 03/26/23 Vertebral column Stage 2 -  Partial thickness loss of dermis presenting  as a shallow open injury with a red, pink wound bed without slough. Small red area with top layer of dermis missing, no drainage noted 03/26/23  0811  -- 68   Pressure Injury 03/26/23 Sacrum Stage 4 - Full thickness tissue loss with exposed bone, tendon or muscle. 5X5 cm open area, approximately 3.5 cm deep. Serosang drainage, red in color. 03/26/23  0814  -- 68   Pressure Injury 03/26/23 Heel Left Stage 1 -  Intact skin with non-blanchable redness of a localized area usually over a bony prominence. red, boggy heel 03/26/23  0816  -- 68   Pressure Injury 03/26/23 Heel Right Stage 1 -  Intact skin with non-blanchable redness of a localized area usually over a bony prominence. red boggy heel 03/26/23  0817  -- 68   Wound / Incision (Open or Dehisced) 11/27/22 Skin tear Elbow Left;Lateral bloody, flap still in place   3 x 4 cm 11/27/22  1600  Elbow  187   Wound / Incision (Open or Dehisced) 11/27/22 Hand Left;Posterior 0.5x 2 cm , dry 11/27/22  1600  Hand  187   Wound / Incision (Open or Dehisced) 11/27/22 Skin tear Arm Lower;Right;Anterior;Lateral 0.5x 3 cm dry 11/27/22  1600  Arm  187            Intake/Output Last 24 hours No intake or output data in the 24 hours ending 06/02/23 2038  Labs/Imaging Results for orders placed or performed during the hospital encounter of 06/02/23 (from the past 48 hour(s))  Basic metabolic panel     Status: Abnormal   Collection Time: 06/02/23  4:30 PM  Result Value Ref Range   Sodium 136 135 - 145 mmol/L    Potassium 3.2 (L) 3.5 - 5.1 mmol/L   Chloride 101 98 - 111 mmol/L   CO2 27 22 - 32 mmol/L   Glucose, Bld 145 (H) 70 - 99 mg/dL    Comment: Glucose reference range applies only to samples taken after fasting for at least 8 hours.   BUN 15 8 - 23 mg/dL   Creatinine, Ser 1.61 0.61 - 1.24 mg/dL   Calcium 9.1 8.9 - 09.6 mg/dL   GFR, Estimated >04 >54 mL/min    Comment: (NOTE) Calculated using the CKD-EPI Creatinine Equation (2021)    Anion gap 8 5 - 15    Comment: Performed at [facility], [address], [place], [place] [address]  Blood culture (routine x 2)     Status: None (Preliminary result)   Collection Time: 06/02/23  4:30 PM   Specimen: BLOOD RIGHT FOREARM  Result Value Ref Range   Specimen Description BLOOD RIGHT FOREARM    Special Requests      BOTTLES DRAWN AEROBIC AND ANAEROBIC Blood Culture adequate volume Performed at [facility], [address], [place], [place] [address]    Culture PENDING    Report Status PENDING   Blood culture (routine x 2)     Status: None (Preliminary result)   Collection Time: 06/02/23  4:59 PM   Specimen: BLOOD RIGHT FOREARM  Result Value Ref Range   Specimen Description BLOOD RIGHT FOREARM    Special Requests      BOTTLES DRAWN AEROBIC AND ANAEROBIC Blood Culture adequate volume Performed at [facility], [address], [place], [place] [address]    Culture PENDING    Report Status PENDING   CBC with Differential/Platelet     Status: Abnormal   Collection Time: 06/02/23  5:54 PM  Result Value  Ref Range   WBC 10.2 4.0 - 10.5 K/uL   RBC 3.77 (L) 4.22 - 5.81 MIL/uL   Hemoglobin 10.8 (L) 13.0 - 17.0 g/dL    Comment: REPEATED TO VERIFY DELTA CHECK NOTED    HCT 34.9 (L) 39.0 - 52.0 %   MCV 92.6 80.0 - 100.0 fL   MCH 28.6 26.0 - 34.0 pg   MCHC 30.9 30.0 - 36.0 g/dL   RDW 16.1 09.6 - 04.5 %   Platelets 259 150 - 400 K/uL    Comment: REPEATED TO VERIFY DELTA CHECK NOTED    nRBC 0.0 0.0 - 0.2 %   Neutrophils Relative % 82 %   Neutro  Abs 8.3 (H) 1.7 - 7.7 K/uL   Lymphocytes Relative 7 %   Lymphs Abs 0.7 0.7 - 4.0 K/uL   Monocytes Relative 9 %   Monocytes Absolute 1.0 0.1 - 1.0 K/uL   Eosinophils Relative 1 %   Eosinophils Absolute 0.1 0.0 - 0.5 K/uL   Basophils Relative 0 %   Basophils Absolute 0.0 0.0 - 0.1 K/uL   Immature Granulocytes 1 %   Abs Immature Granulocytes 0.08 (H) 0.00 - 0.07 K/uL    Comment: Performed at [facility], [address], [place], [place] [address]   DG Chest Port 1 View  Result Date: 06/01/2023 CLINICAL DATA:  Sepsis EXAM: PORTABLE CHEST 1 VIEW COMPARISON:  X-ray 03/25/2023 FINDINGS: Small left effusion with some adjacent opacity. No consolidation or pneumothorax. Right lung is clear. Normal cardiopericardial silhouette. Calcified and tortuous aorta. Film is rotated to the left. Overlapping artifacts along the upper left hemithorax. Surgical clips in the upper abdomen. IMPRESSION: Small left pleural effusion.  Increased from previous. Electronically Signed   By: Karen Kays M.D.   On: 06/01/2023 15:27    Pending Labs Unresulted Labs (From admission, onward)     Start     Ordered   06/09/23 0500  Creatinine, serum  (enoxaparin (LOVENOX)    CrCl >/= 30 ml/min)  Weekly,   R     Comments: while on enoxaparin therapy    06/02/23 2007   06/03/23 0500  Creatinine, serum  Every Mon-Wed-Fri (0500),   R      06/02/23 2005   06/03/23 0500  Comprehensive metabolic panel  Tomorrow morning,   R        06/02/23 2007   06/03/23 0500  CBC  Tomorrow morning,   R        06/02/23 2007   06/02/23 2008  Hemoglobin A1c  Once,   R       Comments: To assess prior glycemic control    06/02/23 2007   06/02/23 1615  CBC with Differential  Once,   STAT        06/02/23 1614            Vitals/Pain Today's Vitals   06/02/23 1815 06/02/23 1830 06/02/23 2016 06/02/23 2030  BP: (!) 126/48 (!) 127/54 (!) 130/47 (!) 124/50  Pulse: 71 74 65 64  Resp: (!) 27 (!) 31 (!) 25 (!) 22  Temp:   99.3 F (37.4 C)  99.3 F (37.4 C)  TempSrc:   Axillary Oral  SpO2: 94% 93% 95% 93%  Weight:      Height:      PainSc:        Isolation Precautions No active isolations  Medications Medications  cefTRIAXone (ROCEPHIN) 1 g in sodium chloride 0.9 % 100 mL IVPB (1 g Intravenous New Bag/Given  06/02/23 2014)  vancomycin (VANCOREADY) IVPB 1750 mg/350 mL (has no administration in time range)  vancomycin (VANCOREADY) IVPB 1250 mg/250 mL (has no administration in time range)  atorvastatin (LIPITOR) tablet 40 mg (has no administration in time range)  furosemide (LASIX) tablet 20 mg (has no administration in time range)  zolpidem (AMBIEN) tablet 5 mg (has no administration in time range)  loperamide (IMODIUM) capsule 2 mg (has no administration in time range)  pantoprazole (PROTONIX) EC tablet 40 mg (has no administration in time range)  alfuzosin (UROXATRAL) 24 hr tablet 10 mg (has no administration in time range)  finasteride (PROSCAR) tablet 5 mg (has no administration in time range)  cyanocobalamin (VITAMIN B12) tablet 1,000 mcg (has no administration in time range)  ferrous sulfate tablet 325 mg (has no administration in time range)  dextromethorphan-guaiFENesin (MUCINEX DM) 30-600 MG per 12 hr tablet 1 tablet (has no administration in time range)  ascorbic acid (VITAMIN C) tablet 500 mg (has no administration in time range)  Vitamin D3 CAPS 1,000 Units (has no administration in time range)  Calcium TABS 400 mg (has no administration in time range)  enoxaparin (LOVENOX) injection 40 mg (has no administration in time range)  acetaminophen (TYLENOL) tablet 650 mg (has no administration in time range)    Or  acetaminophen (TYLENOL) suppository 650 mg (has no administration in time range)  hydrALAZINE (APRESOLINE) injection 10 mg (has no administration in time range)  ondansetron (ZOFRAN) tablet 4 mg (has no administration in time range)    Or  ondansetron (ZOFRAN) injection 4 mg (has no administration in  time range)  cefTRIAXone (ROCEPHIN) 1 g in sodium chloride 0.9 % 100 mL IVPB (has no administration in time range)  insulin aspart (novoLOG) injection 0-9 Units (has no administration in time range)  insulin aspart (novoLOG) injection 0-5 Units (has no administration in time range)  albuterol (PROVENTIL) (2.5 MG/3ML) 0.083% nebulizer solution 2.5 mg (has no administration in time range)    Mobility non-ambulatory     Focused Assessments Pulmonary Assessment Handoff:  Lung sounds: Bilateral Breath Sounds: Clear, Diminished O2 Device: Room Air      R Recommendations: See Admitting Provider Note  Report given to:   Additional Notes: Patient Alert to self. Responds to verbal commands. Nonambulatory. Suprapubic cath in place. RR equal and non labored. Hx of sleep apnea. RR increased during periods of sleep and resting.

## 2023-06-02 NOTE — ED Provider Notes (Signed)
[place] EMERGENCY DEPARTMENT AT [facility] Provider Note   CSN: [id number] Arrival date & time: 06/02/23  1551     History  Chief Complaint  Patient presents with   Abnormal Lab    Jared Bridges is a 89 y.o. male with history of CVA, prostate cancer, history of epidural hematoma and central cord syndrome with quadriplegia, T2DM, cognitive impairment and vascular dementia, HTN, IDA, DJD, first-degree heart block, HLD, BPH presents BIB [facility] for positive blood cultures; pt was seen here yesterday and diagnosed with a UTI; pt had blood cultures drawn and lab called and stated 3 of the 4 blood culture bottles were positive for gram positive cocci in clusters. Does have indwelling foley catheter which is normally replaced every month at home but was replaced yesterday in the ED.  Daughter at bedside reports that patient has been more sleepy lately than usual, less talkative.  He has not had any fevers, nausea vomiting, shortness of breath, and his vitals been stable at home.  Foley has been draining appropriately, no blood noted in the bag.  Patient is mildly tachypneic on arrival but he is not hypoxic and has no increased work of breathing patient is mildly tachypneic however he is not hypoxic and does not have any respiratory distress hello   Abnormal Lab      Home Medications Prior to Admission medications   Medication Sig Start Date End Date Taking? Authorizing Provider  albuterol (VENTOLIN HFA) 108 (90 Base) MCG/ACT inhaler Inhale 2 puffs into the lungs every 6 (six) hours as needed for wheezing or shortness of breath. 01/03/23   Briant Cedar, MD  alfuzosin (UROXATRAL) 10 MG 24 hr tablet Take 1 tablet (10 mg total) by mouth daily with breakfast. 02/23/23   McKenzie, Mardene Celeste, MD  Apremilast (OTEZLA) 30 MG TABS Take 30 mg by mouth 2 (two) times daily.    [provider]  atorvastatin (LIPITOR) 40 MG tablet Take 1 tablet (40 mg total) by mouth daily. 07/09/22    Sharee Holster, NP  Calcium 200 MG TABS Take 400 mg by mouth in the morning and at bedtime.    [provider]  cephALEXin (KEFLEX) 500 MG capsule Take 1 capsule (500 mg total) by mouth 4 (four) times daily. 06/01/23   Triplett, Tammy, PA-C  Cholecalciferol (VITAMIN D3) 1000 units CAPS Take 1,000 Units by mouth daily.    [provider]  clotrimazole-betamethasone (LOTRISONE) cream APPLY TO THE AFFECTED AREA(S) TWICE DAILY 05/09/23   McKenzie, Mardene Celeste, MD  cyanocobalamin 100 MCG tablet Take 1,000 mcg by mouth daily.    [provider]  dextromethorphan-guaiFENesin (MUCINEX DM) 30-600 MG 12hr tablet Take 1 tablet by mouth daily as needed for cough.    [provider]  enzalutamide Diana Eves) 80 MG tablet Take 80 mg by mouth daily.    [provider]  ferrous sulfate 325 (65 FE) MG tablet Take 1 tablet (325 mg total) by mouth 2 (two) times daily with a meal. Mon, wed, fri Patient taking differently: Take 325 mg by mouth 2 (two) times daily. 08/15/22   Johnson, Clanford L, MD  finasteride (PROSCAR) 5 MG tablet Take 1 tablet (5 mg total) by mouth daily. 02/23/23   McKenzie, Mardene Celeste, MD  furosemide (LASIX) 20 MG tablet Take 1 tablet (20 mg total) by mouth every other day. Tuesday, Thursday and Saturday 08/15/22 04/16/23  Cleora Fleet, MD  loperamide (IMODIUM) 2 MG capsule Take 2 mg by  mouth as needed. Once daily as needed    [provider]  magic mouthwash (nystatin, lidocaine, diphenhydrAMINE, alum & mag hydroxide) suspension Swish and spit 5 mLs 4 (four) times daily as needed for mouth pain. 11/28/22   Pokhrel, Rebekah Chesterfield, MD  pantoprazole (PROTONIX) 40 MG tablet Take 1 tablet (40 mg total) by mouth 2 (two) times daily. Patient taking differently: Take 40 mg by mouth daily. 08/15/22 08/15/23  Johnson, Clanford L, MD  pregabalin (LYRICA) 75 MG capsule Take 1 capsule (75 mg total) by mouth 2 (two) times daily. 07/09/22   Sharee Holster, NP   sulfamethoxazole-trimethoprim (BACTRIM DS) 800-160 MG tablet Take 1 tablet by mouth 2 (two) times daily. 04/17/23   McKenzie, Mardene Celeste, MD  topiramate (TOPAMAX) 50 MG tablet Take 0.5 tablets (25 mg total) by mouth 2 (two) times daily. Patient taking differently: Take 50 mg by mouth 2 (two) times daily. 07/09/22   Sharee Holster, NP  vitamin C (ASCORBIC ACID) 250 MG tablet Take 500 mg by mouth daily.    [provider]  zolpidem (AMBIEN) 5 MG tablet Take 5-10 mg by mouth at bedtime as needed for sleep. 07/09/22   [provider]      Allergies    Iodine, Calan [verapamil], Cymbalta [duloxetine hcl], Iodinated contrast media, Latex, Lisinopril, Neurontin [gabapentin], Zoloft [sertraline hcl], Valium [diazepam], Flomax [tamsulosin hcl], Glimepiride, Melatonin, and Tape    Review of Systems   Review of Systems A 10 point review of systems was performed and is negative unless otherwise reported in HPI.  Physical Exam Updated Vital Signs BP (!) 127/54   Pulse 74   Temp 98.4 F (36.9 C) (Oral)   Resp (!) 31   Ht 6\' 1"  (1.854 m)   Wt 73.1 kg   SpO2 93%   BMI 21.26 kg/m  Physical Exam General: Chronically ill-appearing elderly man, lying in bed  HEENT: Sclera anicteric, MMM, trachea midline.  Cardiology: RRR, no murmurs/rubs/gallops. BL radial and DP pulses equal bilaterally.  Resp: Normal respiratory rate and effort. CTAB, no wheezes, rhonchi, crackles.  Abd: Soft, non-tender, non-distended. No rebound tenderness or guarding.  GU: Deferred. MSK: No peripheral edema or signs of trauma.  Skin: warm, dry.  Back: No CVA tenderness Neuro: Alert, not oriented to situation.  Does not respond to questions.  Paraplegic  ED Results / Procedures / Treatments   Labs (all labs ordered are listed, but only abnormal results are displayed) Labs Reviewed  BASIC METABOLIC PANEL - Abnormal; Notable for the following components:      Result Value   Potassium 3.2 (*)    Glucose, Bld  145 (*)    All other components within normal limits  CBC WITH DIFFERENTIAL/PLATELET - Abnormal; Notable for the following components:   RBC 3.77 (*)    Hemoglobin 10.8 (*)    HCT 34.9 (*)    Neutro Abs 8.3 (*)    Abs Immature Granulocytes 0.08 (*)    All other components within normal limits  CULTURE, BLOOD (ROUTINE X 2)  CULTURE, BLOOD (ROUTINE X 2)  CBC WITH DIFFERENTIAL/PLATELET    EKG None  Radiology DG Chest Port 1 View  Result Date: 06/01/2023 CLINICAL DATA:  Sepsis EXAM: PORTABLE CHEST 1 VIEW COMPARISON:  X-ray 03/25/2023 FINDINGS: Small left effusion with some adjacent opacity. No consolidation or pneumothorax. Right lung is clear. Normal cardiopericardial silhouette. Calcified and tortuous aorta. Film is rotated to the left. Overlapping artifacts along the upper left hemithorax. Surgical clips in  the upper abdomen. IMPRESSION: Small left pleural effusion.  Increased from previous. Electronically Signed   By: Karen Kays M.D.   On: 06/01/2023 15:27    Procedures Procedures    Medications Ordered in ED Medications  cefTRIAXone (ROCEPHIN) 1 g in sodium chloride 0.9 % 100 mL IVPB (has no administration in time range)    ED Course/ Medical Decision Making/ A&P                          Medical Decision Making Amount and/or Complexity of Data Reviewed Labs: ordered.  Risk Decision regarding hospitalization.    This patient presents to the ED for concern of positive blood cultures, this involves an extensive number of treatment options, and is a complaint that carries with it a high risk of complications and morbidity.  I considered the following differential and admission for this acute, potentially life threatening condition. Mildly tachypneic on arrival but no hypoxia, no increased WOB, no resp distress.   MDM:    Gram-positive cocci in clusters could be staph or strep, unknown if it is coagulase-negative, which could represent contaminant however 3 of the 4  bottles are growing positive, and this is concerning for bacteremia, especially in the setting of known infection.  Will obtain labs and redraw blood cultures today and admit for blood culture rule out and antibiotics.  Foley was replaced yesterday, do not believe Foley needs to be replaced today.    Labs demonstrate hemoglobin 10.8.  This is close to his baseline however yesterday his hemoglobin was 18.2.  This is likely from volume contraction or an erroneous value, unknown how much fluid he got in the ED yesterday.  WBC mildly increased yesterday from 7->10.2 with increased left shift as well.  UA from yesterday with large leukocytes, positive nitrites, greater than 50 RBCs, 21-25 WBCs.  Urine culture growing gram-negative rods.  Greater concern that blood cultures may be contaminant but will admit for rule out.  Discussed with hospitalist will admit patient. Will give ceftriaxone for UTI and gram negative rods and consult pharmacy for vancomycin/positive blood cultures.    Labs: I Ordered, and personally interpreted labs.  The pertinent results include: Those listed above  Additional history obtained from daughter/wife at bedside, chart review  Cardiac Monitoring: The patient was maintained on a cardiac monitor.  I personally viewed and interpreted the cardiac monitored which showed an underlying rhythm of: Afib rate controlled  Reevaluation: After the interventions noted above, I reevaluated the patient and found that they have :stayed the same  Social Determinants of Health: Lives with family at home  Disposition:  Admit  Co morbidities that complicate the patient evaluation  Past Medical History:  Diagnosis Date   Acute cholecystitis 04/05/2016   Anemia, iron deficiency    Chronic headaches    Migraines   Chronic kidney disease    Resolved   Depression    Diverticulitis    Essential hypertension    GI bleed    Dr. Karilyn Cota - 1998   Hypercholesteremia    Orthostatic  hypotension    Peptic ulcer disease    Peripheral neuropathy    Polymyalgia rheumatica ([name]    Prostate cancer ([name]    Rectal bleeding 11/26/2017   Added automatically from request for surgery [id number]   Stroke [facility])    Residual trouble reading and writing   Syncope    Thyroid nodule    TIA (transient ischemic attack) 12/2017  Type 2 diabetes mellitus ([name]    Upper GI bleed 06/18/2012     Medicines Meds ordered this encounter  Medications   cefTRIAXone (ROCEPHIN) 1 g in sodium chloride 0.9 % 100 mL IVPB    Order Specific Question:   Antibiotic Indication:    Answer:   UTI    I have reviewed the patients home medicines and have made adjustments as needed  Problem List / ED Course: Problem List Items Addressed This Visit       Genitourinary   UTI (urinary tract infection)   Relevant Medications   cefTRIAXone (ROCEPHIN) 1 g in sodium chloride 0.9 % 100 mL IVPB (Start on 06/02/2023  8:00 PM)   Other Visit Diagnoses     Positive blood cultures    -  Primary                   This note was created using dictation software, which may contain spelling or grammatical errors.    Loetta Rough, MD 06/02/23 [phone]

## 2023-06-03 DIAGNOSIS — R7881 Bacteremia: Secondary | ICD-10-CM

## 2023-06-03 LAB — GLUCOSE, CAPILLARY
Glucose-Capillary: 99 mg/dL (ref 70–99)
Glucose-Capillary: 124 mg/dL — ABNORMAL HIGH (ref 70–99)
Glucose-Capillary: 94 mg/dL (ref 70–99)
Glucose-Capillary: 142 mg/dL — ABNORMAL HIGH (ref 70–99)

## 2023-06-03 LAB — CBC
HCT: 33.2 % — ABNORMAL LOW (ref 39.0–52.0)
Hemoglobin: 10.4 g/dL — ABNORMAL LOW (ref 13.0–17.0)
MCH: 29.1 pg (ref 26.0–34.0)
MCHC: 31.3 g/dL (ref 30.0–36.0)
MCV: 92.7 fL (ref 80.0–100.0)
Platelets: 256 10*3/uL (ref 150–400)
RBC: 3.58 MIL/uL — ABNORMAL LOW (ref 4.22–5.81)
RDW: 14.9 % (ref 11.5–15.5)
WBC: 9.3 10*3/uL (ref 4.0–10.5)
nRBC: 0 % (ref 0.0–0.2)

## 2023-06-03 LAB — COMPREHENSIVE METABOLIC PANEL
ALT: 6 U/L (ref 0–44)
AST: 10 U/L — ABNORMAL LOW (ref 15–41)
Albumin: 2.6 g/dL — ABNORMAL LOW (ref 3.5–5.0)
Alkaline Phosphatase: 51 U/L (ref 38–126)
Anion gap: 9 (ref 5–15)
BUN: 13 mg/dL (ref 8–23)
CO2: 26 mmol/L (ref 22–32)
Calcium: 8.9 mg/dL (ref 8.9–10.3)
Chloride: 101 mmol/L (ref 98–111)
Creatinine, Ser: 0.54 mg/dL — ABNORMAL LOW (ref 0.61–1.24)
GFR, Estimated: >60 mL/min (ref >60)
Glucose, Bld: 95 mg/dL (ref 70–99)
Potassium: 3.4 mmol/L — ABNORMAL LOW (ref 3.5–5.1)
Sodium: 136 mmol/L (ref 135–145)
Total Bilirubin: 0.7 mg/dL (ref 0.3–1.2)
Total Protein: 6.4 g/dL — ABNORMAL LOW (ref 6.5–8.1)

## 2023-06-03 LAB — CBC WITH DIFFERENTIAL/PLATELET
Abs Immature Granulocytes: 0.04 10*3/uL (ref 0.00–0.07)
Basophils Absolute: 0 10*3/uL (ref 0.0–0.1)
Basophils Relative: 0 %
Eosinophils Absolute: 0.2 10*3/uL (ref 0.0–0.5)
Eosinophils Relative: 2 %
HCT: 60.4 % — ABNORMAL HIGH (ref 39.0–52.0)
Hemoglobin: 18.2 g/dL — ABNORMAL HIGH (ref 13.0–17.0)
Lymphocytes Relative: 7 %
Lymphs Abs: 0.5 10*3/uL — ABNORMAL LOW (ref 0.7–4.0)
MCH: 28.7 pg (ref 26.0–34.0)
MCHC: 30.1 g/dL (ref 30.0–36.0)
Monocytes Absolute: 0.4 10*3/uL (ref 0.1–1.0)
Monocytes Relative: 6 %
Neutro Abs: 5.9 10*3/uL (ref 1.7–7.7)
Neutrophils Relative %: 84 %
Platelets: 101 10*3/uL — ABNORMAL LOW (ref 150–400)
RBC: 6.34 MIL/uL — ABNORMAL HIGH (ref 4.22–5.81)
RDW: 15.5 % (ref 11.5–15.5)
WBC: 7 10*3/uL (ref 4.0–10.5)

## 2023-06-03 LAB — URINE CULTURE: Culture: >=100000 — AB

## 2023-06-03 LAB — CULTURE, BLOOD (ROUTINE X 2): Culture: NO GROWTH

## 2023-06-03 LAB — HEMOGLOBIN A1C
Hgb A1c MFr Bld: 5.4 % (ref 4.8–5.6)
Mean Plasma Glucose: 108.28 mg/dL

## 2023-06-03 MED ORDER — POTASSIUM CHLORIDE CRYS ER 20 MEQ PO TBCR
20.0000 meq | EXTENDED_RELEASE_TABLET | Freq: Once | ORAL | Status: AC
  Administered 2023-06-03: 20 meq via ORAL
  Filled 2023-06-03: qty 1

## 2023-06-03 MED ORDER — CHLORHEXIDINE GLUCONATE CLOTH 2 % EX PADS
6.0000 | MEDICATED_PAD | Freq: Every day | CUTANEOUS | Status: DC
  Administered 2023-06-03 – 2023-06-04 (×2): 6 via TOPICAL

## 2023-06-03 NOTE — Progress Notes (Signed)
[facility] PROGRESS NOTE  AYRON FLORANCE (DOB: 1930-01-28) ZOX:[id number] PCP: Dorothey Baseman, MD  Brief Narrative: MILAGRO TRAYER is a 89 y.o. male with medical history significant of iron deficiency anemia, diverticulitis, hypertension, hyperlipidemia, peptic ulcer disease, polymyalgia rheumatica, thyroid disease, TIA, type 2 diabetes mellitus, and upper GI bleed, hx of prostate cancer (on Xtandi and Lupron), MGUS, idiopathic neuropathy, hx of spinal cord injury C7 with neurogenic bladder, with limited movement of the left arm, chronic indwelling foley who had presented to the ED 7/1 with symptoms associated with UTI (decreased alertness, oral intake). He was found to have UTI and sent home with antibiotics after foley exchange. Blood cultures drawn at that visit resulted positive for GPC in clusters, so he was called to return for admission. Blood cultures have been repeated and he's started on vancomycin and ceftriaxone.  Subjective: He remains slow to respond, drowsy. Spouse and daughter who is primary caretaker at bedside.  Objective: BP 102/61 (BP Location: Right Arm)   Pulse 63   Temp 98.3 F (36.8 C) (Oral)   Resp 18   Ht 6\' 1"  (1.854 m)   Wt 70.5 kg   SpO2 96%   BMI 20.51 kg/m   Gen: Chronically ill-appearing male in no distress Pulm: Clear anteriorly, nonlabored  CV: RRR, II/VI SEM, no significant edema or JVD GI: Soft, NT, ND, +BS. Neuro: Drowsy but opens eyes, closes them back quickly. Ext: Warm, dry, flaccid with decreased muscle bulk. Skin: No rashes, lesions or ulcers on visualized skin   Assessment & Plan: Principal Problem:   Bacteremia Active Problems:   Essential hypertension   UTI (urinary tract infection)   Iron deficiency anemia due to chronic blood loss   1st degree AV block   Anxiety   Depression, recurrent ([name]   Hypercholesterolemia   Benign prostatic hyperplasia with urinary obstruction   Carotid stenosis   Vitamin D deficiency    Cobalamin deficiency   Controlled type 2 diabetes mellitus with hyperglycemia, without long-term current use of insulin ([name]   Chronic diastolic CHF (congestive heart failure) ([name]   GERD (gastroesophageal reflux disease)   Hypokalemia  Acute metabolic encephalopathy likely due to acute lower UTI: CT head nonacute, but showed atrophy and small vessel disease, lowering the threshold for encephalopathy related to infections for example. - Continue antibiotics as below - If mentation not continuing to improve, would pursue MRI and other work up.    Acute complicated CAUTI: POA. History of UTIs reviewed, had Klebsiella in April 2024 and Enterococcus (vanc sensitive) Feb 2024.  - Foley exchanged 7/1, [facility] [place] [place] and/or urology locally (Dr. Ronne Binning) for consideration of suprapubic catheter per family report.  - Urine culture growing GNRs, so suspect this is Klebsiella but will follow closely, continue ceftriaxone based on prior susceptibilities.    Bacteremia: Suspected based on 3 of 4 bottles growing GPC in clusters. Has risk factors for MRSA.  - Rapid ID showing Staph spp. (not aureus or epidermidis). Will continue vancomycin empirically. - Repeat blood cultures drawn at admission 7/2 remain NGTD. Would need 48 hours of no growth to confirm if this is contaminant.   Hypokalemia:  - Supplement .   BPH, neurogenic bladder:  - Cont proscar 5mg , alfuzosin 10mg    Well-controlled T2DM:  - SSI prn   HLD:  - Continue atorvastatin 40mg    Chronic HFpEF: Appears roughly euvolemic. - Continue lasix 20mg  daily    GERD:  - Continue PPI  Iron  deficiency anemia: Near baseline and stable in 10's. Note value of 18 on 7/1 suspected to be spurious. - Continue iron supplement.   Vitamin D and B12 deficiencies:  - Continue supplements.    Tyrone Nine, MD [facility] [email or web address] 06/03/2023, 3:19 PM

## 2023-06-03 NOTE — Telephone Encounter (Signed)
Pt's daughter [name]  called and states pt is admitted to [facility] currently and wanted to know if he could have iron infusion while he is admitted. ( I called [facility] yesterday and was told they were working on order )

## 2023-06-03 NOTE — Telephone Encounter (Signed)
Jared Bridges will let me know if he has iron infusions at hospital, if not we will continue with [facility] to come out to give infusion when he is out of hospital.

## 2023-06-03 NOTE — TOC Initial Note (Addendum)
Transition of Care [facility]) - Initial/Assessment Note    Patient Details  Name: Jared Bridges MRN: [id number] Date of Birth: 02/12/1930  [facility]) CM/SW Contact:    Karn Cassis, LCSW Phone Number: 06/03/2023, 8:28 AM  Clinical Narrative:  Pt admitted with acute lower UTI. Assessment completed due to high risk readmission score. Pt's daughter reports she lives with pt and is primary caregiver. Pt requires extensive assist with ADLs. He uses an Haematologist wheelchair at home. Pt is active with [facility] RN, OT, and aide.  Will need resumption orders. Pt's daughter plans to take pt home when medically stable. No needs reported at this time.                [facility] notification completed. Notification ID: [id number].  Expected Discharge Plan: Home w Home Health Services Barriers to Discharge: Continued Medical Work up   Patient Goals and CMS Choice Patient states their goals for this hospitalization and ongoing recovery are:: return home   Choice offered to / list presented to : Adult Children Amelia ownership interest in [facility].provided to::  (n/a)    Expected Discharge Plan and Services In-house Referral: Clinical Social Work   Post Acute Care Choice: Resumption of Svcs/PTA Provider Living arrangements for the past 2 months: Single Family Home                               Date [facility] Agency Contacted: 06/03/23 Time HH Agency Contacted: 0865 Representative spoke with at [facility] Agency: Sarah  Prior Living Arrangements/Services Living arrangements for the past 2 months: Single Family Home Lives with:: Adult Children Patient language and need for interpreter reviewed:: Yes Do you feel safe going back to the place where you live?: Yes      Need for Family Participation in Patient Care: Yes (Comment) Care giver support system in place?: Yes (comment) Current home services: DME (electric lift, electric  wheelchair) Criminal Activity/Legal Involvement Pertinent to Current Situation/Hospitalization: No - Comment as needed  Activities of Daily Living      Permission Sought/Granted                  Emotional Assessment   Attitude/Demeanor/Rapport: Unable to Assess Affect (typically observed): Unable to Assess Orientation: : Oriented to Self, Oriented to Place Alcohol / Substance Use: Not Applicable Psych Involvement: No (comment)  Admission diagnosis:  Bacteremia [R78.81] Positive blood cultures [R78.81] Acute cystitis with hematuria [N30.01] Patient Active Problem List   Diagnosis Date Noted   Hypokalemia 06/02/2023   Bacteremia 06/02/2023   Acute metabolic encephalopathy 03/25/2023   History of Clostridioides difficile colitis 01/05/2023   Acute respiratory failure with hypoxia ([name] 12/31/2022   Pneumonia 12/31/2022   Generalized weakness 11/27/2022   C. difficile diarrhea 11/26/2022   Acute urinary retention 11/26/2022   Decreased appetite 11/22/2022   Atelectasis 08/10/2022   Iron deficiency anemia secondary to blood loss (chronic) 08/08/2022   Grade I diastolic dysfunction 07/01/2022   Hyperlipidemia associated with type 2 diabetes mellitus ([name] 06/26/2022   Hypertension associated with type 2 diabetes mellitus ([name] 06/26/2022   History of CVA (cerebrovascular accident) 06/26/2022   Vascular dementia without behavioral disturbance ([name] 06/26/2022   Neurodegenerative cognitive impairment ([name] 06/26/2022   Normochromic normocytic anemia    Mixed hyperlipidemia 06/16/2022   Chronic diastolic CHF (congestive heart failure) ([name] 06/16/2022   GERD (gastroesophageal reflux disease) 06/16/2022  Loss of weight 03/10/2022   History of peptic ulcer disease 09/03/2021   Wheelchair dependence 09/02/2021   Quadriplegia ([name] 09/02/2021   Sleep disturbance    Controlled type 2 diabetes mellitus with hyperglycemia, without long-term current use of insulin ([name]     Central cord syndrome at C7 level of cervical spinal cord ([name] 07/05/2021   Epidural hematoma ([name] 07/02/2021   Lower urinary tract symptoms (LUTS) 10/31/2020   Prostate cancer ([name] 06/20/2020   Elevated PSA 04/18/2020   Cobalamin deficiency 02/22/2020   Intractable chronic migraine without aura 02/20/2020   Altered mental status 02/20/2020   Asthenia 02/20/2020   Diabetic peripheral neuropathy ([name] 02/20/2020   Obstructive sleep apnea of adult 02/20/2020   Medicare annual wellness visit, subsequent 12/07/2019   Depressed mood 12/07/2019   Vitamin D deficiency 10/25/2019   Memory loss 10/25/2019   Fall 10/25/2019   Nonrheumatic aortic valve stenosis 03/23/2018   Migraine headaches 12/29/2017   TIA (transient ischemic attack) 12/28/2017   Carpal tunnel syndrome, left 07/22/2017   Acquired trigger finger 07/22/2017   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[name] 02/28/2016   Total knee replacement status 02/28/2016   CVA (cerebral infarction) 12/19/2015   Carotid stenosis 12/19/2015   Wound infection after surgery 06/29/2015   OA (osteoarthritis) of knee 05/28/2015   Orthostatic hypotension 11/21/2014   Dizziness 11/16/2014   Polymyalgia rheumatica ([name] 09/12/2014   UTI (urinary tract infection) 08/16/2014   Dehydration 08/16/2014   Iron deficiency anemia due to chronic blood loss 08/16/2014   DJD (degenerative joint disease) 08/16/2014   Essential hypertension    Cataract, nuclear 04/11/2014   Complaining of back-related symptom 03/15/2014   Arthralgia of hip or thigh 03/15/2014   Gonalgia 10/10/2013   Left knee pain 10/10/2013   1st degree AV block 08/04/2013   Infection of the upper respiratory tract 04/05/2013   Cataract 02/24/2013   Pseudoaphakia 02/24/2013   Episode of syncope 01/11/2013   Cardiac murmur 11/09/2012   Near syncope 11/09/2012   Anxiety 06/18/2012   Depression, recurrent ([name] 06/18/2012   Difficulty hearing 06/18/2012   Adaptive colitis  06/18/2012   Arthritis, degenerative 06/18/2012   Hearing impairment 06/18/2012   Multinodular goiter 01/29/2012   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   Adenoma of large intestine 05/31/2011   Cephalalgia 05/31/2011   Hypercholesterolemia 05/31/2011   Benign prostatic hyperplasia with urinary obstruction 05/31/2011   LBP (low back pain) 05/31/2011   Essential (primary) hypertension 05/31/2011   Headache(784.0) 05/31/2011   Hyperplasia of prostate 05/31/2011   PCP:  Dorothey Baseman, MD Pharmacy:   [facility] - [place], [place] - [address] [place] [address] Phone: [phone] Fax: [phone]  [facility] - [place], [place] - [address] [place] [address] Phone: [phone] Fax: [phone]     Social Determinants of Health (SDOH) Social History: SDOH Screenings   Food Insecurity: No Food Insecurity (01/01/2023)  Housing: Low Risk  (01/01/2023)  Transportation Needs: No Transportation Needs (01/01/2023)  Utilities: Not At Risk (01/01/2023)  Recent Concern: Utilities - At Risk (11/27/2022)  Depression (PHQ2-9): Low Risk  (01/03/2021)  Tobacco Use: Medium Risk (06/02/2023)   SDOH Interventions:     Readmission Risk Interventions    06/03/2023    8:15 AM 03/26/2023   10:59 AM 01/02/2023   12:22 PM  Readmission Risk Prevention Plan  Transportation Screening Complete Complete Complete  Medication Review (RN Care Manager) Complete Complete Complete  HRI or Home Care Consult Complete  Complete Complete  SW Recovery Care/Counseling Consult Complete  Complete  Palliative Care Screening Not Applicable Not Applicable Not Applicable  Skilled Nursing Facility Not Applicable Not Applicable Not Applicable

## 2023-06-03 NOTE — Telephone Encounter (Signed)
Discussed with patient's daughter [name]  IV iron infusions while he is admitted would need to be ordered by the hospitalist as he is not currently under out service in the hospital.   She verbalized understanding.

## 2023-06-04 DIAGNOSIS — R7881 Bacteremia: Secondary | ICD-10-CM | POA: Diagnosis not present

## 2023-06-04 LAB — GLUCOSE, CAPILLARY
Glucose-Capillary: 116 mg/dL — ABNORMAL HIGH (ref 70–99)
Glucose-Capillary: 149 mg/dL — ABNORMAL HIGH (ref 70–99)

## 2023-06-04 LAB — CULTURE, BLOOD (ROUTINE X 2): Special Requests: ADEQUATE

## 2023-06-04 MED ORDER — CEFDINIR 300 MG PO CAPS: 300.0000 mg | ORAL_CAPSULE | Freq: Two times a day (BID) | ORAL | 0 refills | Status: AC

## 2023-06-04 NOTE — TOC Transition Note (Signed)
Transition of Care [facility]) - CM/SW Discharge Note   Patient Details  Name: Jared Bridges MRN: [id number] Date of Birth: Apr 16, 1930  Transition of Care [facility]) CM/SW Contact:  Elliot Gault, LCSW Phone Number: 06/04/2023, 12:38 PM   Clinical Narrative:     Pt medically stable for dc per MD. Plan remains for return home with resumption of HH from [facility].  MD aware of need for [facility] orders. [facility] aware of dc plan.  No other TOC needs.  Final next level of care: Home w Home Health Services Barriers to Discharge: Barriers Resolved   Patient Goals and CMS Choice   Choice offered to / list presented to : Adult Children  Discharge Placement                         Discharge Plan and Services Additional resources added to the After Visit Summary for   In-house Referral: Clinical Social Work   Post Acute Care Choice: Resumption of Svcs/PTA Provider                        Date [facility] Agency Contacted: 06/03/23 Time HH Agency Contacted: [id number] Representative spoke with at [facility] Agency: Maralyn Sago  Social Determinants of Health (SDOH) Interventions SDOH Screenings   Food Insecurity: No Food Insecurity (06/04/2023)  Housing: Low Risk  (06/04/2023)  Transportation Needs: No Transportation Needs (06/04/2023)  Utilities: Not At Risk (06/04/2023)  Depression (PHQ2-9): Low Risk  (01/03/2021)  Tobacco Use: Medium Risk (06/02/2023)     Readmission Risk Interventions    06/03/2023    8:15 AM 03/26/2023   10:59 AM 01/02/2023   12:22 PM  Readmission Risk Prevention Plan  Transportation Screening Complete Complete Complete  Medication Review Oceanographer) Complete Complete Complete  HRI or Home Care Consult Complete Complete Complete  SW Recovery Care/Counseling Consult Complete  Complete  Palliative Care Screening Not Applicable Not Applicable Not Applicable  Skilled Nursing Facility Not Applicable Not Applicable Not Applicable

## 2023-06-04 NOTE — Telephone Encounter (Signed)
Post ED Visit - Positive Culture Follow-up  Culture report reviewed by antimicrobial stewardship pharmacist: [facility] []  Enzo Bi, Pharm.D. []  Celedonio Miyamoto, Pharm.D., BCPS AQ-ID []  Garvin Fila, Pharm.D., BCPS []  Georgina Pillion, Pharm.D., BCPS []  [place], [address].D., BCPS, AAHIVP []  Estella Husk, Pharm.D., BCPS, AAHIVP []  Lysle Pearl, PharmD, BCPS []  Phillips Climes, PharmD, BCPS []  Agapito Games, PharmD, BCPS [x]  Daylene Posey, PharmD []  Mervyn Gay, PharmD, BCPS []  Vinnie Level, PharmD  [facility] []  Len Childs, PharmD []  Greer Pickerel, PharmD []  Adalberto Cole, PharmD []  Perlie Gold, Rph []  Lonell Face) Jean Rosenthal, PharmD []  Earl Many, PharmD []  Junita Push, PharmD []  Dorna Leitz, PharmD []  Terrilee Files, PharmD []  Lynann Beaver, PharmD []  Keturah Barre, PharmD []  [name], PharmD []  Bernadene Person, PharmD   Positive urine culture Treated with Cephalexin, organism sensitive to the same and no further patient follow-up is required at this time.  Jared Bridges 06/04/2023, 2:48 PM

## 2023-06-04 NOTE — Discharge Summary (Signed)
Physician Discharge Summary   Patient: Jared Bridges MRN: [id number] DOB: 1930-03-24  Admit date:     06/02/2023  Discharge date: 06/04/23  Discharge Physician: Tyrone Nine   PCP: Dorothey Baseman, MD   Recommendations at discharge:  Follow up with PCP and urology after discharge.  Discharging physician will monitor blood cultures from 7/2 which are NGTD at time of discharge.   Discharge Diagnoses: Principal Problem:   Bacteremia Active Problems:   Essential hypertension   UTI (urinary tract infection)   Iron deficiency anemia due to chronic blood loss   1st degree AV block   Anxiety   Depression, recurrent ([name]   Hypercholesterolemia   Benign prostatic hyperplasia with urinary obstruction   Carotid stenosis   Vitamin D deficiency   Cobalamin deficiency   Controlled type 2 diabetes mellitus with hyperglycemia, without long-term current use of insulin ([name]   Chronic diastolic CHF (congestive heart failure) ([name]   GERD (gastroesophageal reflux disease)   Hypokalemia  Hospital Course: Jared Bridges is a 89 y.o. male with medical history significant of iron deficiency anemia, diverticulitis, hypertension, hyperlipidemia, peptic ulcer disease, polymyalgia rheumatica, thyroid disease, TIA, type 2 diabetes mellitus, and upper GI bleed, hx of prostate cancer (on Xtandi and Lupron), MGUS, idiopathic neuropathy, hx of spinal cord injury C7 with neurogenic bladder, with limited movement of the left arm, chronic indwelling foley who had presented to the ED 7/1 with symptoms associated with UTI (decreased alertness, oral intake). He was found to have UTI and sent home with antibiotics after foley exchange. Blood cultures drawn at that visit resulted positive for GPC in clusters, so he was called to return for admission. Blood cultures were repeated, then pt started on vancomycin and ceftriaxone and admitted. Urine culture has grown Klebsiella which will be treated with oral antibiotics at  discharge. Blood cultures from initial visit have grown S. hominis and epidermidis which are suspected contaminants. Repeat blood cultures remain NGTD at 48 hours and will continue to be monitored. The patient is tolerating po, mentating well, has remained afebrile without leukocytosis, VSS, and is stable for discharge back home with resumption HH orders.  Assessment and Plan: Acute metabolic encephalopathy likely due to acute lower UTI: CT head nonacute, but showed atrophy and small vessel disease, lowering the threshold for encephalopathy related to infections for example. - Continue antibiotics as below  Acute complicated CAUTI: POA. History of UTIs reviewed, had Klebsiella in April 2024 and Enterococcus (vanc sensitive) Feb 2024.  - Foley exchanged 7/1, [facility] [place] [place] and/or urology locally (Dr. Ronne Bridges) for consideration of suprapubic catheter per family report.  - Urine culture growing Klebsiella with susceptibilities as below. Will convert to [facility] to complete course of treatment.     Blood culture contaminants: Suspected based on clinical stability and repeat blood cultures remaining negative x48 hours (drawn before vancomycin started in the ED).  - Discharging MD will monitor blood cultures for full 5 days.   Hypokalemia:  - Supplement .   BPH, neurogenic bladder:  - Cont proscar 5mg , alfuzosin 10mg    Well-controlled T2DM:    HLD:  - Continue atorvastatin 40mg    Chronic HFpEF: Appears roughly euvolemic. - Continue lasix 20mg  daily    GERD:  - Continue PPI   Iron deficiency anemia: Near baseline and stable in 10's. Note value of 18 on 7/1 suspected to be spurious. - Continue iron supplement.    Vitamin D and B12 deficiencies:  - Continue  supplements.   Consultants: None Procedures performed: None  Disposition: Home Diet recommendation:  Cardiac and Carb modified diet DISCHARGE MEDICATION: Allergies as of 06/04/2023       Reactions    Iodine Hives, Itching, Other (See Comments)   very allergic(per daughter), can pre-med with benadryl   Calan [verapamil] Other (See Comments)   Weakness   Cymbalta [duloxetine Hcl] Other (See Comments)   Makes patient have jerking motions.   Iodinated Contrast Media Hives, Itching, Other (See Comments)   Can pre-med with benadryl   Latex Other (See Comments)   Redness iritation   Lisinopril Other (See Comments)   Makes patient have jerking motions.   Neurontin [gabapentin] Other (See Comments)   Makes patient have jerking motions.   Zoloft [sertraline Hcl] Diarrhea, Other (See Comments)   Makes patient have jerking motions.   Valium [diazepam] Other (See Comments)   "Did not work"   Patent attorney Hcl] Other (See Comments)   Dizziness   Glimepiride Other (See Comments)   "Did not work"   Melatonin Nausea Only   Tape Other (See Comments)   Skin irritation        Medication List     STOP taking these medications    cephALEXin 500 MG capsule Commonly known as: KEFLEX   sulfamethoxazole-trimethoprim 800-160 MG tablet Commonly known as: BACTRIM DS       TAKE these medications    albuterol 108 (90 Base) MCG/ACT inhaler Commonly known as: VENTOLIN HFA Inhale 2 puffs into the lungs every 6 (six) hours as needed for wheezing or shortness of breath.   alfuzosin 10 MG 24 hr tablet Commonly known as: UROXATRAL Take 1 tablet (10 mg total) by mouth daily with breakfast.   atorvastatin 40 MG tablet Commonly known as: LIPITOR Take 1 tablet (40 mg total) by mouth daily.   Calcium 200 MG Tabs Take 400 mg by mouth in the morning and at bedtime.   cefdinir 300 MG capsule Commonly known as: OMNICEF Take 1 capsule (300 mg total) by mouth 2 (two) times daily for 7 days.   clotrimazole-betamethasone cream Commonly known as: LOTRISONE APPLY TO THE AFFECTED AREA(S) TWICE DAILY   cyanocobalamin 100 MCG tablet Take 1,000 mcg by mouth daily.    dextromethorphan-guaiFENesin 30-600 MG 12hr tablet Commonly known as: MUCINEX DM Take 1 tablet by mouth daily as needed for cough.   escitalopram 20 MG tablet Commonly known as: LEXAPRO Take 20 mg by mouth daily.   ferrous sulfate 325 (65 FE) MG tablet Take 1 tablet (325 mg total) by mouth 2 (two) times daily with a meal. Mon, wed, fri What changed:  when to take this additional instructions   finasteride 5 MG tablet Commonly known as: PROSCAR Take 1 tablet (5 mg total) by mouth daily.   furosemide 20 MG tablet Commonly known as: Lasix Take 1 tablet (20 mg total) by mouth every other day. Tuesday, Thursday and Saturday   loperamide 2 MG capsule Commonly known as: IMODIUM Take 2 mg by mouth as needed. Once daily as needed   magic mouthwash (nystatin, lidocaine, diphenhydrAMINE, alum & mag hydroxide) suspension Swish and spit 5 mLs 4 (four) times daily as needed for mouth pain.   Otezla 30 MG Tabs Generic drug: Apremilast Take 30 mg by mouth 2 (two) times daily.   pantoprazole 40 MG tablet Commonly known as: Protonix Take 1 tablet (40 mg total) by mouth 2 (two) times daily. What changed: when to take this   pregabalin 75  MG capsule Commonly known as: LYRICA Take 1 capsule (75 mg total) by mouth 2 (two) times daily.   topiramate 50 MG tablet Commonly known as: TOPAMAX Take 0.5 tablets (25 mg total) by mouth 2 (two) times daily. What changed: how much to take   vitamin C 250 MG tablet Commonly known as: ASCORBIC ACID Take 500 mg by mouth daily.   Vitamin D3 1000 units Caps Take 1,000 Units by mouth daily.   Xtandi 80 MG tablet Generic drug: enzalutamide Take 80 mg by mouth daily.   zolpidem 5 MG tablet Commonly known as: AMBIEN Take 5-10 mg by mouth at bedtime as needed for sleep.        Follow-up Information     Dorothey Baseman, MD Follow up.   Specialty: Family Medicine Contact information: [address] [place] [address] [phone]                 Discharge Exam: Filed Weights   06/02/23 1559 06/03/23 0417 06/04/23 0411  Weight: 73.1 kg 70.5 kg 70.5 kg  BP (!) 118/53 (BP Location: Right Arm)   Pulse 66   Temp 98.2 F (36.8 C) (Oral)   Resp 16   Ht 6\' 1"  (1.854 m)   Wt 70.5 kg   SpO2 97%   BMI 20.51 kg/m   Pt alert, interactive, appropriately conversant though slowed. Stable systolic murmur, clear lungs, benign abdomen, no new focal deficits. There are no rashes or wounds on visualized skin  Condition at discharge: stable  The results of significant diagnostics from this hospitalization (including imaging, microbiology, ancillary and laboratory) are listed below for reference.   Imaging Studies: CT HEAD WO CONTRAST ( )  Result Date: 06/02/2023 CLINICAL DATA:  Altered mental status EXAM: CT HEAD WITHOUT CONTRAST TECHNIQUE: Contiguous axial images were obtained from the base of the skull through the vertex without intravenous contrast. RADIATION DOSE REDUCTION: This exam was performed according to the departmental dose-optimization program which includes automated exposure control, adjustment of the mA and/or kV according to patient size and/or use of iterative reconstruction technique. COMPARISON:  11/26/2022 FINDINGS: Brain: No evidence of acute infarction, hemorrhage, hydrocephalus, extra-axial collection or mass lesion/mass effect. Global cortical and central atrophy. Subcortical white matter and periventricular small vessel ischemic changes. Vascular: Intracranial atherosclerosis. Skull: Normal. Negative for fracture or focal lesion. Sinuses/Orbits: The visualized paranasal sinuses are essentially clear. The mastoid air cells are unopacified. Other: None. IMPRESSION: No acute intracranial abnormality. Atrophy with small vessel ischemic changes. Electronically Signed   By: Charline Bills M.D.   On: 06/02/2023 22:23   DG Chest Port 1 View  Result Date: 06/01/2023 CLINICAL DATA:  Sepsis EXAM: PORTABLE CHEST 1  VIEW COMPARISON:  X-ray 03/25/2023 FINDINGS: Small left effusion with some adjacent opacity. No consolidation or pneumothorax. Right lung is clear. Normal cardiopericardial silhouette. Calcified and tortuous aorta. Film is rotated to the left. Overlapping artifacts along the upper left hemithorax. Surgical clips in the upper abdomen. IMPRESSION: Small left pleural effusion.  Increased from previous. Electronically Signed   By: Karen Kays M.D.   On: 06/01/2023 15:27    Microbiology: Results for orders placed or performed during the hospital encounter of 06/02/23  Blood culture (routine x 2)     Status: None (Preliminary result)   Collection Time: 06/02/23  4:30 PM   Specimen: BLOOD RIGHT FOREARM  Result Value Ref Range Status   Specimen Description BLOOD RIGHT FOREARM  Final   Special Requests   Final  BOTTLES DRAWN AEROBIC AND ANAEROBIC Blood Culture adequate volume   Culture   Final    NO GROWTH 2 DAYS Performed at [facility], [address], [place], [place] [address]    Report Status PENDING  Incomplete  Blood culture (routine x 2)     Status: None (Preliminary result)   Collection Time: 06/02/23  4:59 PM   Specimen: BLOOD RIGHT FOREARM  Result Value Ref Range Status   Specimen Description BLOOD RIGHT FOREARM  Final   Special Requests   Final    BOTTLES DRAWN AEROBIC AND ANAEROBIC Blood Culture adequate volume   Culture   Final    NO GROWTH 2 DAYS Performed at [facility], [address] [place], [place] [address]    Report Status PENDING  Incomplete    Labs: CBC: Recent Labs  Lab 06/01/23 1407 06/02/23 1754 06/03/23 0425  WBC 7.0 10.2 9.3  NEUTROABS 5.9 8.3*  --   HGB 18.2* 10.8* 10.4*  HCT 60.4* 34.9* 33.2*  MCV 95.3 92.6 92.7  PLT 101* 259 256   Basic Metabolic Panel: Recent Labs  Lab 06/01/23 1407 06/02/23 1630 06/03/23 0425  NA 136 136 136  K 4.1 3.2* 3.4*  CL 102 101 101  CO2 24 27 26   GLUCOSE 88 145* 95  BUN 12 15 13   CREATININE 0.53* 0.71 0.54*   CALCIUM 9.3 9.1 8.9   Liver Function Tests: Recent Labs  Lab 06/01/23 1407 06/03/23 0425  AST 11* 10*  ALT 6 6  ALKPHOS 64 51  BILITOT 0.7 0.7  PROT 6.7 6.4*  ALBUMIN 2.7* 2.6*   CBG: Recent Labs  Lab 06/03/23 0803 06/03/23 1147 06/03/23 1650 06/03/23 2027 06/04/23 0715  GLUCAP 99 124* 94 142* 116*    Discharge time spent: greater than 30 minutes.  Signed: Tyrone Nine, MD [facility] 06/04/2023

## 2023-06-05 LAB — CULTURE, BLOOD (ROUTINE X 2)
Special Requests: ADEQUATE
Special Requests: ADEQUATE

## 2023-06-06 ENCOUNTER — Telehealth (HOSPITAL_BASED_OUTPATIENT_CLINIC_OR_DEPARTMENT_OTHER): Payer: Self-pay | Admitting: *Deleted

## 2023-06-06 LAB — CULTURE, BLOOD (ROUTINE X 2)
Culture: NO GROWTH
Special Requests: ADEQUATE

## 2023-06-06 NOTE — Progress Notes (Signed)
ED Antimicrobial Stewardship Positive Culture Follow Up   Jared Bridges is an 89 y.o. male who presented to [facility] on 06/01/2023 with a chief complaint of  Chief Complaint  Patient presents with   Urinary Tract Infection    Recent Results (from the past 720 hour(s))  Blood Culture (routine x 2)     Status: Abnormal   Collection Time: 06/01/23  3:13 PM   Specimen: BLOOD RIGHT WRIST  Result Value Ref Range Status   Specimen Description   Final    BLOOD RIGHT WRIST Performed at [facility], [address] [place], [place] [address]    Special Requests   Final    BOTTLES DRAWN AEROBIC AND ANAEROBIC Blood Culture adequate volume Performed at [facility], [address], [place], [place] [address]    Culture  Setup Time   Final    GRAM POSITIVE COCCI IN CLUSTERS IN BOTH AEROBIC AND ANAEROBIC BOTTLES Gram Stain Report Called to,Read Back By and Verified With: DOSS M. AT 1252P ON [id number] BY THOMPSON S. CRITICAL RESULT CALLED TO, READ BACK BY AND VERIFIED WITH: RN [place] NICKERSON ON 06/02/23 @ 1735 BY DRT    Culture (A)  Final    STAPHYLOCOCCUS HOMINIS STAPHYLOCOCCUS WARNERI THE SIGNIFICANCE OF ISOLATING THIS ORGANISM FROM A SINGLE SET OF BLOOD CULTURES WHEN MULTIPLE SETS ARE DRAWN IS UNCERTAIN. PLEASE NOTIFY THE MICROBIOLOGY DEPARTMENT WITHIN ONE WEEK IF SPECIATION AND SENSITIVITIES ARE REQUIRED. Performed at [facility], [address] [place], [place] [address]    Report Status 06/05/2023 FINAL  Final   Organism ID, Bacteria STAPHYLOCOCCUS HOMINIS  Final      Susceptibility   Staphylococcus hominis - MIC*    CIPROFLOXACIN >=8 RESISTANT Resistant     ERYTHROMYCIN >=8 RESISTANT Resistant     GENTAMICIN 1 SENSITIVE Sensitive     OXACILLIN >=4 RESISTANT Resistant     TETRACYCLINE >=16 RESISTANT Resistant     VANCOMYCIN 2 SENSITIVE Sensitive     TRIMETH/SULFA 80 RESISTANT Resistant     CLINDAMYCIN >=8 RESISTANT Resistant     RIFAMPIN <=0.5 SENSITIVE Sensitive      Inducible Clindamycin NEGATIVE Sensitive     * STAPHYLOCOCCUS HOMINIS  Blood Culture (routine x 2)     Status: Abnormal   Collection Time: 06/01/23  3:13 PM   Specimen: BLOOD RIGHT WRIST  Result Value Ref Range Status   Specimen Description   Final    BLOOD RIGHT WRIST Performed at [facility], [address], [place], [place] [address]    Special Requests   Final    BOTTLES DRAWN AEROBIC AND ANAEROBIC Blood Culture adequate volume Performed at [facility], [address], [place], [place] [address]    Culture  Setup Time   Final    GRAM POSITIVE COCCI IN CLUSTERS IN BOTH AEROBIC AND ANAEROBIC BOTTLES Gram Stain Report Called to,Read Back By and Verified With: DOSS M. AT 1252P ON [id number] BY THOMPSON S. Performed at [facility], [address] [place], [place] [address]    Culture (A)  Final    STAPHYLOCOCCUS HOMINIS SUSCEPTIBILITIES PERFORMED ON PREVIOUS CULTURE WITHIN THE LAST 5 DAYS. STAPHYLOCOCCUS EPIDERMIDIS THE SIGNIFICANCE OF ISOLATING THIS ORGANISM FROM A SINGLE SET OF BLOOD CULTURES WHEN MULTIPLE SETS ARE DRAWN IS UNCERTAIN. PLEASE NOTIFY THE MICROBIOLOGY DEPARTMENT WITHIN ONE WEEK IF SPECIATION AND SENSITIVITIES ARE REQUIRED. Performed at [facility], [address] [place], [place] [address]    Report Status 06/05/2023 FINAL  Final  Blood Culture ID Panel (Reflexed)     Status: Abnormal   Collection Time: 06/01/23  3:13 PM  Result Value Ref Range Status   Enterococcus faecalis NOT DETECTED NOT DETECTED Final   Enterococcus Faecium NOT DETECTED NOT DETECTED Final   Listeria monocytogenes NOT DETECTED NOT DETECTED Final   Staphylococcus species DETECTED (A) NOT DETECTED Final    Comment: CRITICAL RESULT CALLED TO, READ BACK BY AND VERIFIED WITH: RN [place] NICKERSON ON 06/02/23 @ 1735 BY DRT    Staphylococcus aureus (BCID) NOT DETECTED NOT DETECTED Final   Staphylococcus epidermidis NOT DETECTED NOT DETECTED Final   Staphylococcus lugdunensis NOT DETECTED NOT  DETECTED Final   Streptococcus species NOT DETECTED NOT DETECTED Final   Streptococcus agalactiae NOT DETECTED NOT DETECTED Final   Streptococcus pneumoniae NOT DETECTED NOT DETECTED Final   Streptococcus pyogenes NOT DETECTED NOT DETECTED Final   A.calcoaceticus-baumannii NOT DETECTED NOT DETECTED Final   Bacteroides fragilis NOT DETECTED NOT DETECTED Final   Enterobacterales NOT DETECTED NOT DETECTED Final   Enterobacter cloacae complex NOT DETECTED NOT DETECTED Final   Escherichia coli NOT DETECTED NOT DETECTED Final   Klebsiella aerogenes NOT DETECTED NOT DETECTED Final   Klebsiella oxytoca NOT DETECTED NOT DETECTED Final   Klebsiella pneumoniae NOT DETECTED NOT DETECTED Final   Proteus species NOT DETECTED NOT DETECTED Final   Salmonella species NOT DETECTED NOT DETECTED Final   Serratia marcescens NOT DETECTED NOT DETECTED Final   Haemophilus influenzae NOT DETECTED NOT DETECTED Final   Neisseria meningitidis NOT DETECTED NOT DETECTED Final   Pseudomonas aeruginosa NOT DETECTED NOT DETECTED Final   Stenotrophomonas maltophilia NOT DETECTED NOT DETECTED Final   Candida albicans NOT DETECTED NOT DETECTED Final   Candida auris NOT DETECTED NOT DETECTED Final   Candida glabrata NOT DETECTED NOT DETECTED Final   Candida krusei NOT DETECTED NOT DETECTED Final   Candida parapsilosis NOT DETECTED NOT DETECTED Final   Candida tropicalis NOT DETECTED NOT DETECTED Final   Cryptococcus neoformans/gattii NOT DETECTED NOT DETECTED Final    Comment: Performed at [facility], [address], [place], [place] [address]  Remove and replace urinary cath (placed > 5 days) then obtain urine culture from new indwelling urinary catheter.     Status: Abnormal   Collection Time: 06/01/23  4:49 PM   Specimen: Urine, Catheterized  Result Value Ref Range Status   Specimen Description   Final    URINE, CATHETERIZED Performed at [facility], [address] [place], [place] [address]    Special  Requests   Final    NONE Performed at [facility], [address] [place], [place] [address]    Culture >=100,000 COLONIES/mL KLEBSIELLA PNEUMONIAE (A)  Final   Report Status 06/03/2023 FINAL  Final   Organism ID, Bacteria KLEBSIELLA PNEUMONIAE (A)  Final      Susceptibility   Klebsiella pneumoniae - MIC*    AMPICILLIN >=32 RESISTANT Resistant     CEFAZOLIN <=4 SENSITIVE Sensitive     CEFEPIME <=0.12 SENSITIVE Sensitive     CEFTRIAXONE <=0.25 SENSITIVE Sensitive     CIPROFLOXACIN 1 RESISTANT Resistant     GENTAMICIN <=1 SENSITIVE Sensitive     IMIPENEM <=0.25 SENSITIVE Sensitive     NITROFURANTOIN 256 RESISTANT Resistant     TRIMETH/SULFA <=20 SENSITIVE Sensitive     AMPICILLIN/SULBACTAM 8 SENSITIVE Sensitive     PIP/TAZO 8 SENSITIVE Sensitive     * >=100,000 COLONIES/mL KLEBSIELLA PNEUMONIAE  Blood culture (routine x 2)  Status: None (Preliminary result)   Collection Time: 06/02/23  4:30 PM   Specimen: BLOOD RIGHT FOREARM  Result Value Ref Range Status   Specimen Description BLOOD RIGHT FOREARM  Final   Special Requests   Final    BOTTLES DRAWN AEROBIC AND ANAEROBIC Blood Culture adequate volume   Culture   Final    NO GROWTH 4 DAYS Performed at [facility] [address] [place], [place] [address]    Report Status PENDING  Incomplete  Blood culture (routine x 2)     Status: None (Preliminary result)   Collection Time: 06/02/23  4:59 PM   Specimen: BLOOD RIGHT FOREARM  Result Value Ref Range Status   Specimen Description BLOOD RIGHT FOREARM  Final   Special Requests   Final    BOTTLES DRAWN AEROBIC AND ANAEROBIC Blood Culture adequate volume   Culture   Final    NO GROWTH 4 DAYS Performed at [facility], [address] [place], [place] [address]    Report Status PENDING  Incomplete    [x]  Blood culture above noted in discharge summary from MD Grunz on 7/4. Blood cultures felt to be contaminant at that time. In same note, MD Jarvis Newcomer said would follow for 5 full  days. So far, repeat Bcx are negative x 4 days.  No further action required at this time.   Delmar Landau, PharmD, BCPS 06/06/2023 11:31 AM ED Clinical Pharmacist -  [phone]

## 2023-06-06 NOTE — Telephone Encounter (Signed)
Post ED Visit - Positive Culture Follow-up  Culture report reviewed by antimicrobial stewardship pharmacist: [facility] []  Enzo Bi, Pharm.D. []  Celedonio Miyamoto, Pharm.D., BCPS AQ-ID []  Garvin Fila, Pharm.D., BCPS []  Georgina Pillion, Pharm.D., BCPS []  Sunflower, [address].D., BCPS, AAHIVP []  Estella Husk, Pharm.D., BCPS, AAHIVP []  Lysle Pearl, PharmD, BCPS []  Phillips Climes, PharmD, BCPS []  Agapito Games, PharmD, BCPS []  Verlan Friends, PharmD []  Mervyn Gay, PharmD, BCPS [x]  Riccardo Dubin, PharmD  [facility] []  Len Childs, PharmD []  Greer Pickerel, PharmD []  Adalberto Cole, PharmD []  Perlie Gold, Rph []  Lonell Face) Jean Rosenthal, PharmD []  Earl Many, PharmD []  Junita Push, PharmD []  Dorna Leitz, PharmD []  Terrilee Files, PharmD []  Lynann Beaver, PharmD []  Keturah Barre, PharmD []  [name], PharmD []  Bernadene Person, PharmD   Positive blood culture Pt admitted/discharged by Dr. Jarvis Newcomer. Note at discharge aware of culture findings and Dr. Jarvis Newcomer stating will monitor for full 5 days. No further action is needed  Jared Bridges 06/06/2023, 1:51 PM

## 2023-06-07 LAB — CULTURE, BLOOD (ROUTINE X 2)

## 2023-06-08 NOTE — Telephone Encounter (Signed)
Jared Bridges from [facility] called and states pharmacy denied approval for infusion. States he spoke with daughter who said they would like to get through [place]. Discussed with chelsea. No referral from [place] was sent to [name] so she will need to get pcp from [place] to order infusion or send referral to [name] so we can order infusion in order for [facility] to pay for.

## 2023-06-08 NOTE — Telephone Encounter (Signed)
Discussed with pt's daughter and she states he is seeing Dr. Clent Ridges Wednesday this week and she will discuss infusion/ referral at that visit and I will call her on Thursday to get update to see what she wants to do.

## 2023-06-11 ENCOUNTER — Encounter: Payer: Self-pay | Admitting: Urology

## 2023-06-11 NOTE — Telephone Encounter (Signed)
Pt just told daughter [name] he is having pain on his left side. States could be on ? He was on rocephin first last Monday then put on vanomycin thinking it was a gram + bacteria but then that did not grow again so could be all the antibiotics and she is just giving him some of the pink stuff?and it has been helping. Nothing usual with his stools  [phone]

## 2023-06-11 NOTE — Telephone Encounter (Signed)
Called daughter and the pink stuff is pepto. Taking protonix 40mg . States he is eating a lot better. States is better since giving pepto. She states she will let us know if pain comes back.  She states visit at [facility] she asked for community care but Dr. Clent Ridges said he needed to get labs there and infusion there if needed. She said he ordered labs but lab was closed by time they got there. I told her I could fax over labs we had to Dr. Ardeth Perfect. She also asked me if I could request community care. She does want him to have this infusion. She said Dr. Clent Ridges also wanted to know if there was a bone marrow issue? She states he had a bone marrow biopsy back in 1992 but she is not sure if he has any issues with bone marrow?

## 2023-06-12 ENCOUNTER — Encounter (INDEPENDENT_AMBULATORY_CARE_PROVIDER_SITE_OTHER): Payer: Self-pay | Admitting: *Deleted

## 2023-06-15 NOTE — Telephone Encounter (Signed)
Patient will have to obtain authorization from the [place] to be seen outside of the [place].  They will have to contact their PCP at the [facility] and request a referral to be created. We can not create referrals for [facility] patients. That must come from the approval of the [place] MD.

## 2023-06-16 ENCOUNTER — Other Ambulatory Visit: Payer: Self-pay

## 2023-06-16 DIAGNOSIS — R338 Other retention of urine: Secondary | ICD-10-CM

## 2023-06-16 NOTE — Telephone Encounter (Signed)
Jared Bridges , pt's daughter states that she had the approval from the [place].  She is requesting a referral to IR for the SP tube.  Referral created and sent per Dr. Ronne Binning verbal consent.

## 2023-06-16 NOTE — Telephone Encounter (Signed)
Referral to IR created.

## 2023-06-18 ENCOUNTER — Other Ambulatory Visit: Payer: Self-pay

## 2023-06-18 DIAGNOSIS — R338 Other retention of urine: Secondary | ICD-10-CM

## 2023-06-19 ENCOUNTER — Encounter (INDEPENDENT_AMBULATORY_CARE_PROVIDER_SITE_OTHER): Payer: Self-pay

## 2023-06-22 ENCOUNTER — Encounter: Payer: Self-pay | Admitting: Urology

## 2023-06-22 NOTE — Telephone Encounter (Signed)
Copied pt's mychart message below. Will hold message until I get an update.  Jared Bridges,   I have updated Dad's Oncology team at [facility] to look at the IRON on July 29th (his regularly scheduled transport). They know you all are working diligently on his iron.   The [facility] has given me trouble on the last two [facility] providers, you all and Urology with Dr. Ronne Binning.  They should not deny these.   After the 29th, [facility], we can decide to transport him to [facility].  I don't think [facility] will have another way to have it done in the home but I will let you know.   Thank you, Toniann Fail for bearing with me.   [name] for Evalee Mutton

## 2023-06-23 ENCOUNTER — Emergency Department (HOSPITAL_COMMUNITY): Payer: No Typology Code available for payment source

## 2023-06-23 ENCOUNTER — Emergency Department (HOSPITAL_COMMUNITY)
Admission: EM | Admit: 2023-06-23 | Discharge: 2023-06-23 | Disposition: A | Discharge status: Home / Self Care | Payer: No Typology Code available for payment source | Attending: Emergency Medicine | Admitting: Emergency Medicine

## 2023-06-23 ENCOUNTER — Other Ambulatory Visit: Payer: Self-pay

## 2023-06-23 ENCOUNTER — Encounter (HOSPITAL_COMMUNITY): Payer: Self-pay

## 2023-06-23 DIAGNOSIS — J9811 Atelectasis: Secondary | ICD-10-CM

## 2023-06-23 DIAGNOSIS — Z1152 Encounter for screening for COVID-19: Secondary | ICD-10-CM | POA: Diagnosis not present

## 2023-06-23 DIAGNOSIS — J90 Pleural effusion, not elsewhere classified: Secondary | ICD-10-CM | POA: Diagnosis not present

## 2023-06-23 DIAGNOSIS — R531 Weakness: Secondary | ICD-10-CM | POA: Diagnosis present

## 2023-06-23 DIAGNOSIS — Z91040 Latex allergy status: Secondary | ICD-10-CM | POA: Insufficient documentation

## 2023-06-23 DIAGNOSIS — R627 Adult failure to thrive: Secondary | ICD-10-CM

## 2023-06-23 LAB — URINALYSIS, ROUTINE W REFLEX MICROSCOPIC
Bilirubin Urine: NEGATIVE
Glucose, UA: NEGATIVE mg/dL
Ketones, ur: NEGATIVE mg/dL
Leukocytes,Ua: NEGATIVE
Nitrite: NEGATIVE
Protein, ur: NEGATIVE mg/dL
RBC / HPF: >50 RBC/hpf (ref 0–5)
Specific Gravity, Urine: 1.017 (ref 1.005–1.030)
pH: 6 (ref 5.0–8.0)

## 2023-06-23 LAB — CBC
HCT: 36 % — ABNORMAL LOW (ref 39.0–52.0)
Hemoglobin: 10.9 g/dL — ABNORMAL LOW (ref 13.0–17.0)
MCH: 28.7 pg (ref 26.0–34.0)
MCHC: 30.3 g/dL (ref 30.0–36.0)
MCV: 94.7 fL (ref 80.0–100.0)
Platelets: 242 10*3/uL (ref 150–400)
RBC: 3.8 MIL/uL — ABNORMAL LOW (ref 4.22–5.81)
RDW: 14.9 % (ref 11.5–15.5)
WBC: 8.5 10*3/uL (ref 4.0–10.5)
nRBC: 0 % (ref 0.0–0.2)

## 2023-06-23 LAB — COMPREHENSIVE METABOLIC PANEL
ALT: 7 U/L (ref 0–44)
AST: 10 U/L — ABNORMAL LOW (ref 15–41)
Albumin: 2.7 g/dL — ABNORMAL LOW (ref 3.5–5.0)
Alkaline Phosphatase: 54 U/L (ref 38–126)
Anion gap: 9 (ref 5–15)
BUN: 13 mg/dL (ref 8–23)
CO2: 28 mmol/L (ref 22–32)
Calcium: 9.7 mg/dL (ref 8.9–10.3)
Chloride: 103 mmol/L (ref 98–111)
Creatinine, Ser: 0.57 mg/dL — ABNORMAL LOW (ref 0.61–1.24)
GFR, Estimated: >60 mL/min (ref >60)
Glucose, Bld: 117 mg/dL — ABNORMAL HIGH (ref 70–99)
Potassium: 4.1 mmol/L (ref 3.5–5.1)
Sodium: 140 mmol/L (ref 135–145)
Total Bilirubin: 0.6 mg/dL (ref 0.3–1.2)
Total Protein: 6.9 g/dL (ref 6.5–8.1)

## 2023-06-23 LAB — SARS CORONAVIRUS 2 BY RT PCR: SARS Coronavirus 2 by RT PCR: NEGATIVE

## 2023-06-23 MED ORDER — LACTATED RINGERS IV BOLUS
1000.0000 mL | Freq: Once | INTRAVENOUS | Status: AC
  Administered 2023-06-23: 1000 mL via INTRAVENOUS

## 2023-06-23 NOTE — ED Triage Notes (Signed)
Daughter called [facility] for failure to thrive. Pt has decreased urine output and oral intake. Pt also coughing when he eats and drinks. Family complains that he is not responding as much. Family also says stool is dark with odor. Family talking about hospice care.

## 2023-06-23 NOTE — ED Notes (Signed)
Pt's daughter updated pt has been picked up and otw home.

## 2023-06-23 NOTE — Discharge Instructions (Signed)
It was our pleasure to provide your ER care today - we hope that you feel better.  Drink plenty of fluids/stay well hydrated. Consider supplementing nutrition with Boost, Ensure, or other nutritious shake.   Follow up closely with primary care doctor this coming week. Also discuss with them possible palliative medicine team evaluation to clarify goals and plan of care moving forward.  Return to  ER if worse, new symptoms, fevers, increased trouble breathing, new/severe pain, or other concern.

## 2023-06-23 NOTE — ED Provider Notes (Signed)
  Physical Exam  BP (!) 146/60   Pulse 68   Temp 97.7 F (36.5 C) (Oral)   Resp (!) 26   Ht 6\' 1"  (1.854 m)   Wt 70.5 kg   SpO2 93%   BMI 20.51 kg/m   Physical Exam  Procedures  Procedures  ED Course / MDM    Medical Decision Making Amount and/or Complexity of Data Reviewed Labs: ordered. Radiology: ordered. ECG/medicine tests: ordered.   Received patient in signout.  Plan to check on CMP.  If stable discharge home.  CMP is reassuring.  Discussed about x-ray findings with previous provider who was reviewed previous CT scans and x-rays and does not think this is pneumonia.  Will discharge home with outpatient follow-up.       Benjiman Core, MD 06/23/23 [phone]

## 2023-06-23 NOTE — ED Provider Notes (Signed)
[place] EMERGENCY DEPARTMENT AT [facility] Provider Note   CSN: [id number] Arrival date & time: 06/23/23  1246     History  Chief Complaint  Patient presents with   Weakness    Jared Bridges is a 89 y.o. male.  Pt with generalized weakness. Pt v limited historian - level 5 caveat, pt denies specific pain or c/o. Per EMS report, pt w generalized weakness, non prod cough, decreased po intake, urine concentrated.  No report of trauma/fall. No report of fevers.   The history is provided by the patient, medical records and the EMS personnel. The history is limited by the condition of the patient.  Weakness Associated symptoms: no abdominal pain, no chest pain, no fever, no headaches and no shortness of breath        Home Medications Prior to Admission medications   Medication Sig Start Date End Date Taking? Authorizing Provider  albuterol (VENTOLIN HFA) 108 (90 Base) MCG/ACT inhaler Inhale 2 puffs into the lungs every 6 (six) hours as needed for wheezing or shortness of breath. 01/03/23   Briant Cedar, MD  alfuzosin (UROXATRAL) 10 MG 24 hr tablet Take 1 tablet (10 mg total) by mouth daily with breakfast. 02/23/23   McKenzie, Mardene Celeste, MD  Apremilast (OTEZLA) 30 MG TABS Take 30 mg by mouth 2 (two) times daily.    [provider]  atorvastatin (LIPITOR) 40 MG tablet Take 1 tablet (40 mg total) by mouth daily. 07/09/22   Sharee Holster, NP  Calcium 200 MG TABS Take 400 mg by mouth in the morning and at bedtime.    [provider]  Cholecalciferol (VITAMIN D3) 1000 units CAPS Take 1,000 Units by mouth daily.    [provider]  clotrimazole-betamethasone (LOTRISONE) cream APPLY TO THE AFFECTED AREA(S) TWICE DAILY 05/09/23   McKenzie, Mardene Celeste, MD  cyanocobalamin 100 MCG tablet Take 1,000 mcg by mouth daily.    [provider]  dextromethorphan-guaiFENesin (MUCINEX DM) 30-600 MG 12hr tablet Take 1 tablet by mouth daily as needed for  cough.    [provider]  enzalutamide Diana Eves) 80 MG tablet Take 80 mg by mouth daily.    [provider]  escitalopram (LEXAPRO) 20 MG tablet Take 20 mg by mouth daily.    [provider]  ferrous sulfate 325 (65 FE) MG tablet Take 1 tablet (325 mg total) by mouth 2 (two) times daily with a meal. Mon, wed, fri Patient taking differently: Take 325 mg by mouth 2 (two) times daily. 08/15/22   Johnson, Clanford L, MD  finasteride (PROSCAR) 5 MG tablet Take 1 tablet (5 mg total) by mouth daily. 02/23/23   McKenzie, Mardene Celeste, MD  furosemide (LASIX) 20 MG tablet Take 1 tablet (20 mg total) by mouth every other day. Tuesday, Thursday and Saturday 08/15/22 06/03/23  Cleora Fleet, MD  loperamide (IMODIUM) 2 MG capsule Take 2 mg by mouth as needed. Once daily as needed    [provider]  magic mouthwash (nystatin, lidocaine, diphenhydrAMINE, alum & mag hydroxide) suspension Swish and spit 5 mLs 4 (four) times daily as needed for mouth pain. 11/28/22   Pokhrel, Rebekah Chesterfield, MD  pantoprazole (PROTONIX) 40 MG tablet Take 1 tablet (40 mg total) by mouth 2 (two) times daily. Patient taking differently: Take 40 mg by mouth daily. 08/15/22 08/15/23  Johnson, Clanford L, MD  pregabalin (LYRICA) 75 MG capsule Take 1 capsule (75 mg total) by mouth 2 (two) times daily. 07/09/22  Sharee Holster, NP  topiramate (TOPAMAX) 50 MG tablet Take 0.5 tablets (25 mg total) by mouth 2 (two) times daily. Patient taking differently: Take 50 mg by mouth 2 (two) times daily. 07/09/22   Sharee Holster, NP  vitamin C (ASCORBIC ACID) 250 MG tablet Take 500 mg by mouth daily.    [provider]  zolpidem (AMBIEN) 5 MG tablet Take 5-10 mg by mouth at bedtime as needed for sleep. 07/09/22   [provider]      Allergies    Iodine, Calan [verapamil], Cymbalta [duloxetine hcl], Iodinated contrast media, Latex, Lisinopril, Neurontin [gabapentin], Zoloft [sertraline hcl], Valium [diazepam],  Flomax [tamsulosin hcl], Glimepiride, Melatonin, and Tape    Review of Systems   Review of Systems  Unable to perform ROS: Dementia  Constitutional:  Negative for fever.  Respiratory:  Negative for shortness of breath.   Cardiovascular:  Negative for chest pain.  Gastrointestinal:  Negative for abdominal pain.  Neurological:  Positive for weakness. Negative for headaches.    Physical Exam Updated Vital Signs BP 123/61 (BP Location: Left Arm)   Pulse 63   Temp 97.7 F (36.5 C) (Oral)   Resp 14   Ht 1.854 m (6\' 1" )   Wt 70.5 kg   SpO2 97%   BMI 20.51 kg/m  Physical Exam Vitals and nursing note reviewed.  Constitutional:      Appearance: Normal appearance. He is well-developed.  HENT:     Head: Atraumatic.     Nose: Nose normal.     Mouth/Throat:     Mouth: Mucous membranes are moist.     Pharynx: Oropharynx is clear.  Eyes:     General: No scleral icterus.    Conjunctiva/sclera: Conjunctivae normal.     Pupils: Pupils are equal, round, and reactive to light.  Neck:     Vascular: No carotid bruit.     Trachea: No tracheal deviation.     Comments: No stiffness or rigidity.  Cardiovascular:     Rate and Rhythm: Normal rate and regular rhythm.     Pulses: Normal pulses.     Heart sounds: Normal heart sounds. No murmur heard.    No friction rub. No gallop.  Pulmonary:     Effort: Pulmonary effort is normal. No accessory muscle usage or respiratory distress.     Breath sounds: Normal breath sounds.  Abdominal:     General: Bowel sounds are normal. There is no distension.     Palpations: Abdomen is soft.     Tenderness: There is no abdominal tenderness. There is no guarding.  Genitourinary:    Comments: No cva tenderness. Foley cath in place.  Musculoskeletal:        General: No swelling or tenderness.     Cervical back: Normal range of motion and neck supple. No rigidity or tenderness.     Right lower leg: No edema.     Left lower leg: No edema.  Skin:     General: Skin is warm and dry.     Findings: No rash.  Neurological:     Mental Status: He is alert.     Comments: Alert, speech clear. Moves extremities purposefully.      ED Results / Procedures / Treatments   Labs (all labs ordered are listed, but only abnormal results are displayed) Results for orders placed or performed during the hospital encounter of 06/23/23  Urinalysis, Routine w reflex microscopic -Urine, Catheterized; Indwelling urinary catheter  Result Value Ref Range  Color, Urine YELLOW YELLOW   APPearance CLOUDY (A) CLEAR   Specific Gravity, Urine 1.017 1.005 - 1.030   pH 6.0 5.0 - 8.0   Glucose, UA NEGATIVE NEGATIVE mg/dL   Hgb urine dipstick SMALL (A) NEGATIVE   Bilirubin Urine NEGATIVE NEGATIVE   Ketones, ur NEGATIVE NEGATIVE mg/dL   Protein, ur NEGATIVE NEGATIVE mg/dL   Nitrite NEGATIVE NEGATIVE   Leukocytes,Ua NEGATIVE NEGATIVE   RBC / HPF >50 0 - 5 RBC/hpf   WBC, UA 0-5 0 - 5 WBC/hpf   Bacteria, UA RARE (A) NONE SEEN   Squamous Epithelial / HPF 0-5 0 - 5 /HPF   Mucus PRESENT    Amorphous Crystal PRESENT    Ca Oxalate Crys, UA PRESENT    CT Head Wo Contrast  Result Date: 06/23/2023 CLINICAL DATA:  Altered mental status EXAM: CT HEAD WITHOUT CONTRAST TECHNIQUE: Contiguous axial images were obtained from the base of the skull through the vertex without intravenous contrast. RADIATION DOSE REDUCTION: This exam was performed according to the departmental dose-optimization program which includes automated exposure control, adjustment of the mA and/or kV according to patient size and/or use of iterative reconstruction technique. COMPARISON:  06/02/2023 FINDINGS: Brain: There is patient motion in some of the images. Additional images were repeated. No acute intracranial findings are seen. There are no signs of bleeding within the cranium. Cortical sulci are prominent. There is decreased density in periventricular white matter. Vascular: Scattered arterial  calcifications are seen. Skull: No acute findings are seen. Sinuses/Orbits: There is mucosal thickening in right side of sphenoid sinus. Other: None. IMPRESSION: No acute intracranial findings are seen in noncontrast CT brain. Atrophy. Small-vessel disease. Mild chronic sphenoid sinusitis. Electronically Signed   By: Ernie Avena M.D.   On: 06/23/2023 14:55   DG Chest Port 1 View  Result Date: 06/23/2023 CLINICAL DATA:  cough EXAM: PORTABLE CHEST - 1 VIEW COMPARISON:  06/01/2023 FINDINGS: Increasing atelectasis or consolidation at the left lung base. Heart size and mediastinal contours are within normal limits. Aortic Atherosclerosis (ICD10-170.0). No effusion. Visualized bones unremarkable. IMPRESSION: Increasing left basilar atelectasis or consolidation. Electronically Signed   By: Corlis Leak M.D.   On: 06/23/2023 14:44   CT HEAD WO CONTRAST ( )  Result Date: 06/02/2023 CLINICAL DATA:  Altered mental status EXAM: CT HEAD WITHOUT CONTRAST TECHNIQUE: Contiguous axial images were obtained from the base of the skull through the vertex without intravenous contrast. RADIATION DOSE REDUCTION: This exam was performed according to the departmental dose-optimization program which includes automated exposure control, adjustment of the mA and/or kV according to patient size and/or use of iterative reconstruction technique. COMPARISON:  11/26/2022 FINDINGS: Brain: No evidence of acute infarction, hemorrhage, hydrocephalus, extra-axial collection or mass lesion/mass effect. Global cortical and central atrophy. Subcortical white matter and periventricular small vessel ischemic changes. Vascular: Intracranial atherosclerosis. Skull: Normal. Negative for fracture or focal lesion. Sinuses/Orbits: The visualized paranasal sinuses are essentially clear. The mastoid air cells are unopacified. Other: None. IMPRESSION: No acute intracranial abnormality. Atrophy with small vessel ischemic changes. Electronically Signed    By: Charline Bills M.D.   On: 06/02/2023 22:23   DG Chest Port 1 View  Result Date: 06/01/2023 CLINICAL DATA:  Sepsis EXAM: PORTABLE CHEST 1 VIEW COMPARISON:  X-ray 03/25/2023 FINDINGS: Small left effusion with some adjacent opacity. No consolidation or pneumothorax. Right lung is clear. Normal cardiopericardial silhouette. Calcified and tortuous aorta. Film is rotated to the left. Overlapping artifacts along the upper left hemithorax. Surgical clips in the upper  abdomen. IMPRESSION: Small left pleural effusion.  Increased from previous. Electronically Signed   By: Karen Kays M.D.   On: 06/01/2023 15:27    EKG EKG Interpretation Date/Time:  Tuesday June 23 2023 14:29:42 EDT Ventricular Rate:  78 PR Interval:  75 QRS Duration:  108 QT Interval:  427 QTC Calculation: 487 R Axis:   -44  Text Interpretation: Sinus rhythm Artifact Confirmed by Cathren Laine ([id number]) on 06/23/2023 2:45:02 PM  Radiology CT Head Wo Contrast  Result Date: 06/23/2023 CLINICAL DATA:  Altered mental status EXAM: CT HEAD WITHOUT CONTRAST TECHNIQUE: Contiguous axial images were obtained from the base of the skull through the vertex without intravenous contrast. RADIATION DOSE REDUCTION: This exam was performed according to the departmental dose-optimization program which includes automated exposure control, adjustment of the mA and/or kV according to patient size and/or use of iterative reconstruction technique. COMPARISON:  06/02/2023 FINDINGS: Brain: There is patient motion in some of the images. Additional images were repeated. No acute intracranial findings are seen. There are no signs of bleeding within the cranium. Cortical sulci are prominent. There is decreased density in periventricular white matter. Vascular: Scattered arterial calcifications are seen. Skull: No acute findings are seen. Sinuses/Orbits: There is mucosal thickening in right side of sphenoid sinus. Other: None. IMPRESSION: No acute intracranial  findings are seen in noncontrast CT brain. Atrophy. Small-vessel disease. Mild chronic sphenoid sinusitis. Electronically Signed   By: Ernie Avena M.D.   On: 06/23/2023 14:55   DG Chest Port 1 View  Result Date: 06/23/2023 CLINICAL DATA:  cough EXAM: PORTABLE CHEST - 1 VIEW COMPARISON:  06/01/2023 FINDINGS: Increasing atelectasis or consolidation at the left lung base. Heart size and mediastinal contours are within normal limits. Aortic Atherosclerosis (ICD10-170.0). No effusion. Visualized bones unremarkable. IMPRESSION: Increasing left basilar atelectasis or consolidation. Electronically Signed   By: Corlis Leak M.D.   On: 06/23/2023 14:44    Procedures Procedures    Medications Ordered in ED Medications  lactated ringers bolus 1,000 mL (1,000 mLs Intravenous Bolus 06/23/23 1436)    ED Course/ Medical Decision Making/ A&P                             Medical Decision Making Problems Addressed: Atelectasis of left lung: chronic illness or injury Failure to thrive in adult: acute illness or injury with systemic symptoms that poses a threat to life or bodily functions Generalized weakness: acute illness or injury with systemic symptoms that poses a threat to life or bodily functions Pleural effusion on left: chronic illness or injury  Amount and/or Complexity of Data Reviewed Independent Historian:     Details: Ems, family, hx External Data Reviewed: notes. Labs: ordered. Decision-making details documented in ED Course. Radiology: ordered and independent interpretation performed. Decision-making details documented in ED Course. ECG/medicine tests: ordered and independent interpretation performed. Decision-making details documented in ED Course.  Risk Decision regarding hospitalization.   Iv ns. Continuous pulse ox and cardiac monitoring. Labs ordered/sent. Imaging ordered.   Differential diagnosis includes uti, dehydration, infection, etc. Dispo decision including  potential need for admission considered - will get labs and imaging and reassess.   Reviewed nursing notes and prior charts for additional history. External reports reviewed. Additional history from: EMS.   Cardiac monitor: sinus rhythm, rate 66.  LR bolus.   Labs reviewed/interpreted by me - ua neg for uti.   Xrays reviewed/interpreted by me -  left lower lobe atelectasis/?effusion - noted  on prior imaging. Pt afebrile. Breathing comfortably. ?possible chronic/recurrent aspiration vs other.   CT reviewed/interpreted by me - no hem.   Recheck, vitals normal, breathing comfortably.   1518, labs pending - signed out to Dr Rubin Payor, if labs ok and no indication admission, feel ok for d/c w close pcp f/u. Given general decline for months, current functional status, may also benefit from palliative care team eval as outpatient to clarify goals and plan of care moving forward.            Final Clinical Impression(s) / ED Diagnoses Final diagnoses:  None    Rx / DC Orders ED Discharge Orders     None         Cathren Laine, MD 06/23/23 1520

## 2023-06-26 NOTE — Telephone Encounter (Signed)
Jared Bridges,   I still have not reached [name] to get this scheduled.  Could you please help and see if you can get IR scheduled for this patient?  Thank you!!

## 2023-07-01 ENCOUNTER — Encounter (INDEPENDENT_AMBULATORY_CARE_PROVIDER_SITE_OTHER): Payer: Self-pay

## 2023-07-01 NOTE — Telephone Encounter (Signed)
Copied pt's mychart message below: Hi Toniann Fail and [place],   I got a call from the [facility] this morning and they have a new program where patients can receive an iron injection in the home and will give Dad a referral.    They know you all want home to have 510mg  of serahemeiv2 and will let me know shortly if he qualifies. You all can view his labwork from Monday at [facility] in [place].   I will keep you posted!!   [name] for Evalee Mutton

## 2023-07-13 ENCOUNTER — Other Ambulatory Visit (HOSPITAL_COMMUNITY): Payer: Self-pay | Admitting: Student

## 2023-07-13 ENCOUNTER — Telehealth: Payer: Self-pay

## 2023-07-13 DIAGNOSIS — R338 Other retention of urine: Secondary | ICD-10-CM

## 2023-07-13 NOTE — Telephone Encounter (Signed)
Patient's daughter states that they have decided to have patient under the care of Hospice due to failure to thrive.  The daughter is going to cancel his upcoming apt with IR for the SP tube.  She will continue to update the office.

## 2023-07-14 ENCOUNTER — Inpatient Hospital Stay (HOSPITAL_COMMUNITY)
Admission: EM | Admit: 2023-07-14 | Discharge: 2023-07-19 | DRG: 871 | Disposition: A | Discharge status: Hospice | Payer: No Typology Code available for payment source | Attending: Osteopathic Medicine | Admitting: Osteopathic Medicine

## 2023-07-14 ENCOUNTER — Emergency Department (HOSPITAL_COMMUNITY): Payer: No Typology Code available for payment source

## 2023-07-14 ENCOUNTER — Encounter (HOSPITAL_COMMUNITY): Payer: Self-pay | Admitting: Pharmacy Technician

## 2023-07-14 ENCOUNTER — Encounter (HOSPITAL_COMMUNITY): Payer: Self-pay

## 2023-07-14 ENCOUNTER — Ambulatory Visit (HOSPITAL_COMMUNITY): Payer: Medicare Other

## 2023-07-14 DIAGNOSIS — L89154 Pressure ulcer of sacral region, stage 4: Secondary | ICD-10-CM | POA: Diagnosis present

## 2023-07-14 DIAGNOSIS — C61 Malignant neoplasm of prostate: Secondary | ICD-10-CM | POA: Diagnosis present

## 2023-07-14 DIAGNOSIS — Z888 Allergy status to other drugs, medicaments and biological substances status: Secondary | ICD-10-CM

## 2023-07-14 DIAGNOSIS — E43 Unspecified severe protein-calorie malnutrition: Secondary | ICD-10-CM | POA: Diagnosis present

## 2023-07-14 DIAGNOSIS — R6520 Severe sepsis without septic shock: Secondary | ICD-10-CM | POA: Diagnosis present

## 2023-07-14 DIAGNOSIS — H109 Unspecified conjunctivitis: Secondary | ICD-10-CM | POA: Diagnosis present

## 2023-07-14 DIAGNOSIS — A4152 Sepsis due to Pseudomonas: Secondary | ICD-10-CM | POA: Diagnosis present

## 2023-07-14 DIAGNOSIS — Z681 Body mass index (BMI) 19 or less, adult: Secondary | ICD-10-CM

## 2023-07-14 DIAGNOSIS — Z91041 Radiographic dye allergy status: Secondary | ICD-10-CM

## 2023-07-14 DIAGNOSIS — E7800 Pure hypercholesterolemia, unspecified: Secondary | ICD-10-CM | POA: Diagnosis present

## 2023-07-14 DIAGNOSIS — E876 Hypokalemia: Secondary | ICD-10-CM | POA: Diagnosis present

## 2023-07-14 DIAGNOSIS — Z8249 Family history of ischemic heart disease and other diseases of the circulatory system: Secondary | ICD-10-CM | POA: Diagnosis not present

## 2023-07-14 DIAGNOSIS — N390 Urinary tract infection, site not specified: Secondary | ICD-10-CM | POA: Diagnosis present

## 2023-07-14 DIAGNOSIS — E872 Acidosis, unspecified: Secondary | ICD-10-CM | POA: Diagnosis present

## 2023-07-14 DIAGNOSIS — J9601 Acute respiratory failure with hypoxia: Secondary | ICD-10-CM | POA: Diagnosis present

## 2023-07-14 DIAGNOSIS — Z1152 Encounter for screening for COVID-19: Secondary | ICD-10-CM | POA: Diagnosis not present

## 2023-07-14 DIAGNOSIS — R6521 Severe sepsis with septic shock: Secondary | ICD-10-CM

## 2023-07-14 DIAGNOSIS — Z9109 Other allergy status, other than to drugs and biological substances: Secondary | ICD-10-CM

## 2023-07-14 DIAGNOSIS — G43909 Migraine, unspecified, not intractable, without status migrainosus: Secondary | ICD-10-CM | POA: Diagnosis present

## 2023-07-14 DIAGNOSIS — D509 Iron deficiency anemia, unspecified: Secondary | ICD-10-CM | POA: Diagnosis present

## 2023-07-14 DIAGNOSIS — J9811 Atelectasis: Secondary | ICD-10-CM | POA: Diagnosis present

## 2023-07-14 DIAGNOSIS — R627 Adult failure to thrive: Secondary | ICD-10-CM | POA: Diagnosis present

## 2023-07-14 DIAGNOSIS — A4150 Gram-negative sepsis, unspecified: Secondary | ICD-10-CM | POA: Diagnosis not present

## 2023-07-14 DIAGNOSIS — Z8673 Personal history of transient ischemic attack (TIA), and cerebral infarction without residual deficits: Secondary | ICD-10-CM

## 2023-07-14 DIAGNOSIS — Z66 Do not resuscitate: Secondary | ICD-10-CM | POA: Diagnosis not present

## 2023-07-14 DIAGNOSIS — Z8711 Personal history of peptic ulcer disease: Secondary | ICD-10-CM

## 2023-07-14 DIAGNOSIS — Z79899 Other long term (current) drug therapy: Secondary | ICD-10-CM

## 2023-07-14 DIAGNOSIS — R011 Cardiac murmur, unspecified: Secondary | ICD-10-CM | POA: Diagnosis present

## 2023-07-14 DIAGNOSIS — H1030 Unspecified acute conjunctivitis, unspecified eye: Secondary | ICD-10-CM | POA: Diagnosis not present

## 2023-07-14 DIAGNOSIS — Z96652 Presence of left artificial knee joint: Secondary | ICD-10-CM | POA: Diagnosis present

## 2023-07-14 DIAGNOSIS — R532 Functional quadriplegia: Secondary | ICD-10-CM | POA: Diagnosis present

## 2023-07-14 DIAGNOSIS — A419 Sepsis, unspecified organism: Secondary | ICD-10-CM

## 2023-07-14 DIAGNOSIS — N319 Neuromuscular dysfunction of bladder, unspecified: Secondary | ICD-10-CM | POA: Diagnosis present

## 2023-07-14 DIAGNOSIS — E1142 Type 2 diabetes mellitus with diabetic polyneuropathy: Secondary | ICD-10-CM | POA: Diagnosis present

## 2023-07-14 DIAGNOSIS — D472 Monoclonal gammopathy: Secondary | ICD-10-CM | POA: Diagnosis present

## 2023-07-14 DIAGNOSIS — R54 Age-related physical debility: Secondary | ICD-10-CM | POA: Diagnosis present

## 2023-07-14 DIAGNOSIS — I10 Essential (primary) hypertension: Secondary | ICD-10-CM | POA: Diagnosis present

## 2023-07-14 DIAGNOSIS — Z91040 Latex allergy status: Secondary | ICD-10-CM

## 2023-07-14 DIAGNOSIS — Z515 Encounter for palliative care: Secondary | ICD-10-CM | POA: Diagnosis not present

## 2023-07-14 DIAGNOSIS — F32A Depression, unspecified: Secondary | ICD-10-CM | POA: Diagnosis present

## 2023-07-14 DIAGNOSIS — R7989 Other specified abnormal findings of blood chemistry: Secondary | ICD-10-CM | POA: Diagnosis present

## 2023-07-14 DIAGNOSIS — Z981 Arthrodesis status: Secondary | ICD-10-CM

## 2023-07-14 DIAGNOSIS — M353 Polymyalgia rheumatica: Secondary | ICD-10-CM | POA: Diagnosis present

## 2023-07-14 DIAGNOSIS — F17290 Nicotine dependence, other tobacco product, uncomplicated: Secondary | ICD-10-CM | POA: Diagnosis present

## 2023-07-14 DIAGNOSIS — Z7189 Other specified counseling: Secondary | ICD-10-CM | POA: Diagnosis not present

## 2023-07-14 LAB — URINALYSIS, W/ REFLEX TO CULTURE (INFECTION SUSPECTED)
Bilirubin Urine: NEGATIVE
Glucose, UA: NEGATIVE mg/dL
Ketones, ur: 20 mg/dL — AB
Nitrite: NEGATIVE
Protein, ur: 100 mg/dL — AB
Specific Gravity, Urine: 1.026 (ref 1.005–1.030)
WBC, UA: >50 WBC/hpf (ref 0–5)
pH: 5 (ref 5.0–8.0)

## 2023-07-14 LAB — CBC WITH DIFFERENTIAL/PLATELET
Abs Immature Granulocytes: 0.18 10*3/uL — ABNORMAL HIGH (ref 0.00–0.07)
Basophils Absolute: 0 10*3/uL (ref 0.0–0.1)
Basophils Relative: 0 %
Eosinophils Absolute: 0 10*3/uL (ref 0.0–0.5)
Eosinophils Relative: 0 %
HCT: 36.3 % — ABNORMAL LOW (ref 39.0–52.0)
Hemoglobin: 11.1 g/dL — ABNORMAL LOW (ref 13.0–17.0)
Immature Granulocytes: 1 %
Lymphocytes Relative: 2 %
Lymphs Abs: 0.4 10*3/uL — ABNORMAL LOW (ref 0.7–4.0)
MCH: 28.9 pg (ref 26.0–34.0)
MCHC: 30.6 g/dL (ref 30.0–36.0)
MCV: 94.5 fL (ref 80.0–100.0)
Monocytes Absolute: 0.8 10*3/uL (ref 0.1–1.0)
Monocytes Relative: 4 %
Neutro Abs: 20.7 10*3/uL — ABNORMAL HIGH (ref 1.7–7.7)
Neutrophils Relative %: 93 %
Platelets: 297 10*3/uL (ref 150–400)
RBC: 3.84 MIL/uL — ABNORMAL LOW (ref 4.22–5.81)
RDW: 14.6 % (ref 11.5–15.5)
WBC: 22 10*3/uL — ABNORMAL HIGH (ref 4.0–10.5)
nRBC: 0 % (ref 0.0–0.2)

## 2023-07-14 LAB — COMPREHENSIVE METABOLIC PANEL
ALT: 8 U/L (ref 0–44)
AST: 10 U/L — ABNORMAL LOW (ref 15–41)
Albumin: 2.5 g/dL — ABNORMAL LOW (ref 3.5–5.0)
Alkaline Phosphatase: 57 U/L (ref 38–126)
Anion gap: 16 — ABNORMAL HIGH (ref 5–15)
BUN: 34 mg/dL — ABNORMAL HIGH (ref 8–23)
CO2: 23 mmol/L (ref 22–32)
Calcium: 9.3 mg/dL (ref 8.9–10.3)
Chloride: 101 mmol/L (ref 98–111)
Creatinine, Ser: 0.66 mg/dL (ref 0.61–1.24)
GFR, Estimated: >60 mL/min (ref >60)
Glucose, Bld: 141 mg/dL — ABNORMAL HIGH (ref 70–99)
Potassium: 3.4 mmol/L — ABNORMAL LOW (ref 3.5–5.1)
Sodium: 140 mmol/L (ref 135–145)
Total Bilirubin: 1.2 mg/dL (ref 0.3–1.2)
Total Protein: 6.7 g/dL (ref 6.5–8.1)

## 2023-07-14 LAB — RESP PANEL BY RT-PCR (RSV, FLU A&B, COVID)  RVPGX2
Influenza A by PCR: NEGATIVE
Influenza B by PCR: NEGATIVE
Resp Syncytial Virus by PCR: NEGATIVE
SARS Coronavirus 2 by RT PCR: NEGATIVE

## 2023-07-14 LAB — PROTIME-INR
INR: 1.3 — ABNORMAL HIGH (ref 0.8–1.2)
Prothrombin Time: 16.4 seconds — ABNORMAL HIGH (ref 11.4–15.2)

## 2023-07-14 LAB — CBG MONITORING, ED: Glucose-Capillary: 136 mg/dL — ABNORMAL HIGH (ref 70–99)

## 2023-07-14 LAB — LACTIC ACID, PLASMA
Lactic Acid, Venous: 1.5 mmol/L (ref 0.5–1.9)
Lactic Acid, Venous: 3.5 mmol/L (ref 0.5–1.9)
Lactic Acid, Venous: 3 mmol/L (ref 0.5–1.9)

## 2023-07-14 LAB — BLOOD GAS, VENOUS
Acid-Base Excess: 0.5 mmol/L (ref 0.0–2.0)
Bicarbonate: 27 mmol/L (ref 20.0–28.0)
Drawn by: 442
O2 Saturation: 26.9 %
Patient temperature: 37.7
pCO2, Ven: 51 mmHg (ref 44–60)
pH, Ven: 7.33 (ref 7.25–7.43)
pO2, Ven: <31 mmHg — CL (ref 32–45)

## 2023-07-14 LAB — POC OCCULT BLOOD, ED

## 2023-07-14 LAB — TYPE AND SCREEN
ABO/RH(D): O POS
Antibody Screen: NEGATIVE

## 2023-07-14 LAB — TROPONIN I (HIGH SENSITIVITY)
Troponin I (High Sensitivity): 58 ng/L — ABNORMAL HIGH (ref <18)
Troponin I (High Sensitivity): 60 ng/L — ABNORMAL HIGH (ref <18)

## 2023-07-14 MED ORDER — VANCOMYCIN HCL 1500 MG/300ML IV SOLN
1500.0000 mg | Freq: Once | INTRAVENOUS | Status: AC
  Administered 2023-07-14: 1500 mg via INTRAVENOUS
  Filled 2023-07-14: qty 300

## 2023-07-14 MED ORDER — SORBITOL 70 % SOLN: 30.0000 mL | Freq: Every day | Status: DC | PRN

## 2023-07-14 MED ORDER — FINASTERIDE 5 MG PO TABS
5.0000 mg | ORAL_TABLET | Freq: Every day | ORAL | Status: DC
  Administered 2023-07-15 – 2023-07-16 (×2): 5 mg via ORAL
  Filled 2023-07-14 (×2): qty 1

## 2023-07-14 MED ORDER — LACTATED RINGERS IV BOLUS (SEPSIS)
250.0000 mL | Freq: Once | INTRAVENOUS | Status: AC
  Administered 2023-07-14: 250 mL via INTRAVENOUS

## 2023-07-14 MED ORDER — LACTATED RINGERS IV SOLN: INTRAVENOUS | Status: AC

## 2023-07-14 MED ORDER — ACETAMINOPHEN 500 MG PO TABS: 500.0000 mg | ORAL_TABLET | Freq: Four times a day (QID) | ORAL | Status: DC | PRN

## 2023-07-14 MED ORDER — DOCUSATE SODIUM 100 MG PO CAPS
100.0000 mg | ORAL_CAPSULE | Freq: Two times a day (BID) | ORAL | Status: DC
  Administered 2023-07-15: 100 mg via ORAL
  Filled 2023-07-14 (×3): qty 1

## 2023-07-14 MED ORDER — SODIUM CHLORIDE 0.9 % IV SOLN
2.0000 g | Freq: Three times a day (TID) | INTRAVENOUS | Status: DC
  Administered 2023-07-14 – 2023-07-15 (×2): 2 g via INTRAVENOUS
  Filled 2023-07-14 (×2): qty 12.5

## 2023-07-14 MED ORDER — ZOLPIDEM TARTRATE 5 MG PO TABS: 5.0000 mg | ORAL_TABLET | Freq: Every evening | ORAL | Status: DC | PRN

## 2023-07-14 MED ORDER — ATORVASTATIN CALCIUM 40 MG PO TABS
40.0000 mg | ORAL_TABLET | Freq: Every day | ORAL | Status: DC
  Administered 2023-07-15 – 2023-07-16 (×2): 40 mg via ORAL
  Filled 2023-07-14 (×2): qty 1

## 2023-07-14 MED ORDER — LACTATED RINGERS IV BOLUS (SEPSIS)
1000.0000 mL | Freq: Once | INTRAVENOUS | Status: AC
  Administered 2023-07-14: 1000 mL via INTRAVENOUS

## 2023-07-14 MED ORDER — ONDANSETRON HCL 4 MG PO TABS: 4.0000 mg | ORAL_TABLET | Freq: Four times a day (QID) | ORAL | Status: DC | PRN

## 2023-07-14 MED ORDER — PANTOPRAZOLE SODIUM 40 MG PO TBEC
40.0000 mg | DELAYED_RELEASE_TABLET | Freq: Every day | ORAL | Status: DC
  Administered 2023-07-15 – 2023-07-16 (×2): 40 mg via ORAL
  Filled 2023-07-14 (×2): qty 1

## 2023-07-14 MED ORDER — ALBUTEROL SULFATE (2.5 MG/3ML) 0.083% IN NEBU: 3.0000 mL | INHALATION_SOLUTION | Freq: Four times a day (QID) | RESPIRATORY_TRACT | Status: DC | PRN

## 2023-07-14 MED ORDER — VANCOMYCIN HCL 1500 MG/300ML IV SOLN: 1500.0000 mg | INTRAVENOUS | Status: DC

## 2023-07-14 MED ORDER — SODIUM CHLORIDE 0.9 % IV SOLN
2.0000 g | Freq: Once | INTRAVENOUS | Status: AC
  Administered 2023-07-14: 2 g via INTRAVENOUS
  Filled 2023-07-14: qty 12.5

## 2023-07-14 MED ORDER — ALFUZOSIN HCL ER 10 MG PO TB24
10.0000 mg | ORAL_TABLET | Freq: Every day | ORAL | Status: DC
  Administered 2023-07-15 – 2023-07-16 (×2): 10 mg via ORAL
  Filled 2023-07-14 (×2): qty 1

## 2023-07-14 MED ORDER — VANCOMYCIN HCL IN DEXTROSE 1-5 GM/200ML-% IV SOLN: 1000.0000 mg | Freq: Once | INTRAVENOUS | Status: DC

## 2023-07-14 MED ORDER — MIRTAZAPINE 15 MG PO TABS
15.0000 mg | ORAL_TABLET | Freq: Every day | ORAL | Status: DC
  Administered 2023-07-14 – 2023-07-15 (×2): 15 mg via ORAL
  Filled 2023-07-14 (×2): qty 1

## 2023-07-14 MED ORDER — PREGABALIN 75 MG PO CAPS
75.0000 mg | ORAL_CAPSULE | Freq: Two times a day (BID) | ORAL | Status: DC
  Administered 2023-07-14 – 2023-07-16 (×4): 75 mg via ORAL
  Filled 2023-07-14 (×4): qty 1

## 2023-07-14 MED ORDER — METRONIDAZOLE 500 MG/100ML IV SOLN
500.0000 mg | Freq: Once | INTRAVENOUS | Status: AC
  Administered 2023-07-14: 500 mg via INTRAVENOUS
  Filled 2023-07-14: qty 100

## 2023-07-14 MED ORDER — ENZALUTAMIDE 80 MG PO TABS
80.0000 mg | ORAL_TABLET | Freq: Every day | ORAL | Status: DC
  Administered 2023-07-15: 80 mg via ORAL

## 2023-07-14 MED ORDER — TOPIRAMATE 25 MG PO TABS
50.0000 mg | ORAL_TABLET | Freq: Two times a day (BID) | ORAL | Status: DC
  Administered 2023-07-14 – 2023-07-16 (×4): 50 mg via ORAL
  Filled 2023-07-14 (×4): qty 2

## 2023-07-14 MED ORDER — ACETAMINOPHEN 650 MG RE SUPP: 650.0000 mg | Freq: Four times a day (QID) | RECTAL | Status: DC | PRN

## 2023-07-14 MED ORDER — VITAMIN C 500 MG PO TABS
500.0000 mg | ORAL_TABLET | Freq: Every day | ORAL | Status: DC
  Administered 2023-07-15 – 2023-07-16 (×2): 500 mg via ORAL
  Filled 2023-07-14 (×2): qty 1

## 2023-07-14 MED ORDER — ONDANSETRON HCL 4 MG/2ML IJ SOLN: 4.0000 mg | Freq: Four times a day (QID) | INTRAMUSCULAR | Status: DC | PRN

## 2023-07-14 NOTE — Sepsis Progress Note (Signed)
Notified provider of need to order repeat lactic acid. ° °

## 2023-07-14 NOTE — Plan of Care (Signed)
  Problem: Education: Goal: Knowledge of General Education information will improve Description: Including pain rating scale, medication(s)/side effects and non-pharmacologic comfort measures Outcome: Not Progressing   Problem: Health Behavior/Discharge Planning: Goal: Ability to manage health-related needs will improve Outcome: Not Progressing   

## 2023-07-14 NOTE — Sepsis Progress Note (Signed)
Code Sepsis protocol being monitored by e[employer]. 

## 2023-07-14 NOTE — ED Triage Notes (Signed)
Pt bib ems from home with reports of not eating since Friday and difficulty breathing. Code sepsis called by EMS due to pt VS. 100.23F HR 108 BP 90/50 RR 40 EtCO2 10

## 2023-07-14 NOTE — H&P (Addendum)
History and Physical    Patient: Jared Bridges:[id number] DOB: 1930/09/13 DOA: 07/14/2023 DOS: the patient was seen and examined on 07/14/2023 PCP: Alden Benjamin, MD  Patient coming from: Home  Chief Complaint:  Chief Complaint  Patient presents with   Failure To Thrive   HPI: Jared Bridges is a 89 y.o. 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   He has not eaten for the last 4 days and has stopped talking the last 2 days.  She has noticed a cough and also that his urine is darker brown. My history comes entirely from the emergency department provider as the daughter had just left the ER prior to my evaluation. The patient met sepsis criteria with a temperature of 100.7, blood pressure 90/50, respiratory rate of 32 and tachycardia.  He does not wear oxygen normally but required 2.5 L nasal cannula to have O2 sats of 100%.  His workup in the emergency department revealed an elevated white blood cell count of 22.  His urine is still pending but his chest x-ray revealed only a chronic left lower lobe atelectasis.  Covid negative. He did receive sepsis level fluids and empiric vancomycin, cefepime, and Flagyl.  His vitals improved.  Blood pressure 134/62, pulse 100, temperature 98.1 F (36.7 C), temperature source Rectal, resp. rate (!) 31, weight 71.2 kg, SpO2 100%.   At this time we do not have a source for his sepsis but he will be admitted for continued antibiotics and work up.    Review of Systems: unable to review all systems due to the inability of the patient to answer questions. Past Medical History:  Diagnosis Date   Acute cholecystitis 04/05/2016   Anemia, iron deficiency    Chronic headaches    Migraines   Chronic kidney disease     Resolved   Depression    Diverticulitis    Essential hypertension    GI bleed    Dr. Karilyn Cota - 1998   Hypercholesteremia    Orthostatic hypotension    Peptic ulcer disease    Peripheral neuropathy    Polymyalgia rheumatica ([name]    Prostate cancer ([name]    Rectal bleeding 11/26/2017   Added automatically from request for surgery [id number]   Stroke [facility])    Residual trouble reading and writing   Syncope    Thyroid nodule    TIA (transient ischemic attack) 12/2017   Type 2 diabetes mellitus ([name]    Upper GI bleed 06/18/2012   Past Surgical History:  Procedure Laterality Date   APPENDECTOMY     as child   BACK SURGERY     2000 IDET SPINAL PROC   CHOLECYSTECTOMY N/A 04/07/2016   Procedure: CHOLECYSTECTOMY;  Surgeon: Franky Macho, MD;  Location: [facility];  Service: General;  Laterality: N/A;   COLON SURGERY     FOR DIVERTICULOSIS   1988   COLONOSCOPY N/A 01/07/2018   Procedure: COLONOSCOPY;  Surgeon: Malissa Hippo, MD;  Location: [facility];  Service: Endoscopy;  Laterality: N/A;  1:00   COLONOSCOPY WITH PROPOFOL N/A 08/12/2022   Procedure: COLONOSCOPY WITH PROPOFOL;  Surgeon: Dolores Frame, MD;  Location: [facility];  Service: Gastroenterology;  Laterality: N/A;  ASA 3   CRYOTHERAPY     ESOPHAGOGASTRODUODENOSCOPY (EGD) WITH  PROPOFOL N/A 08/09/2022   Procedure: ESOPHAGOGASTRODUODENOSCOPY (EGD) WITH PROPOFOL;  Surgeon: Dolores Frame, MD;  Location: [facility];  Service: Gastroenterology;  Laterality: N/A;   EYE SURGERY     right cataract with lens implant   FEMUR SURGERY     RT LEG        1953   GIVENS CAPSULE STUDY N/A 08/13/2022   Procedure: GIVENS CAPSULE STUDY;  Surgeon: Dolores Frame, MD;  Location: [facility];  Service: Gastroenterology;  Laterality: N/A;   HAND SURGERY     RIGHT   HERNIA REPAIR     1-right inguinal, 3- left inguinal   IRRIGATION AND DEBRIDEMENT KNEE Left 07/09/2015   Procedure: LEFT KNEE IRRIGATION AND DEBRIDEMENT  WOUND CLOSURE;  Surgeon: Ollen Gross, MD;  Location: [facility];  Service: Orthopedics;  Laterality: Left;   JOINT REPLACEMENT  02/28/2016   lt knee revision   KNEE DEBRIDEMENT     2016   LEFT   MENISCUS REPAIR     LEFT   POLYPECTOMY  01/07/2018   Procedure: POLYPECTOMY;  Surgeon: Malissa Hippo, MD;  Location: [facility];  Service: Endoscopy;;  colon    POLYPECTOMY  08/12/2022   Procedure: POLYPECTOMY;  Surgeon: Dolores Frame, MD;  Location: [facility];  Service: Gastroenterology;;   SHOULDER OPEN ROTATOR CUFF REPAIR     RT SHOULDER   SPINAL FUSION     SPINAL FUSION     TONSILLECTOMY     TOTAL KNEE ARTHROPLASTY Left 05/28/2015   Procedure: LEFT TOTAL KNEE ARTHROPLASTY;  Surgeon: Ollen Gross, MD;  Location: [facility];  Service: Orthopedics;  Laterality: Left;   TOTAL KNEE REVISION Left 02/28/2016   Procedure: LEFT TOTAL KNEE REVISION;  Surgeon: Tarry Kos, MD;  Location: [facility];  Service: Orthopedics;  Laterality: Left;   TRIGGER FINGER RELEASE     RT HAND   1990S   UVULECTOMY  2004   TURBINATE,TONSIL,ADENOID   Social History:  reports that he quit smoking about 36 years ago. His smoking use included cigars and cigarettes. He started smoking about 64 years ago. He has a 2.8 pack-year smoking history. He has never used smokeless tobacco. He reports that he does not drink alcohol and does not use drugs.  Allergies  Allergen Reactions   Iodine Hives, Itching and Other (See Comments)    very allergic(per daughter), can pre-med with benadryl   Calan [Verapamil] Other (See Comments)    Weakness   Cymbalta [Duloxetine Hcl] Other (See Comments)    Makes patient have jerking motions.   Iodinated Contrast Media Hives, Itching and Other (See Comments)    Can pre-med with benadryl   Latex Other (See Comments)    Redness iritation   Lisinopril Other (See Comments)    Makes patient have jerking motions.   Neurontin [Gabapentin] Other (See Comments)    Makes patient have  jerking motions.   Zoloft [Sertraline Hcl] Diarrhea and Other (See Comments)    Makes patient have jerking motions.   Valium [Diazepam] Other (See Comments)    "Did not work"   Flomax [Tamsulosin Hcl] Other (See Comments)    Dizziness   Glimepiride Other (See Comments)    "Did not work"   Melatonin Nausea Only   Tape Other (See Comments)    Skin irritation    Family History  Problem Relation Age of Onset   Colon cancer Sister    Hypertension Other    Depression Brother  Depression Daughter     Prior to Admission medications   Medication Sig Start Date End Date Taking? Authorizing Provider  albuterol (VENTOLIN HFA) 108 (90 Base) MCG/ACT inhaler Inhale 2 puffs into the lungs every 6 (six) hours as needed for wheezing or shortness of breath. 01/03/23  Yes Briant Cedar, MD  alfuzosin (UROXATRAL) 10 MG 24 hr tablet Take 1 tablet (10 mg total) by mouth daily with breakfast. 02/23/23  Yes McKenzie, Mardene Celeste, MD  Apremilast (OTEZLA) 30 MG TABS Take 30 mg by mouth 2 (two) times daily.   Yes [provider]  atorvastatin (LIPITOR) 40 MG tablet Take 1 tablet (40 mg total) by mouth daily. 07/09/22  Yes Sharee Holster, NP  Calcium 200 MG TABS Take 400 mg by mouth in the morning and at bedtime.   Yes [provider]  Cholecalciferol (VITAMIN D3) 1000 units CAPS Take 1,000 Units by mouth daily.   Yes [provider]  cyanocobalamin 100 MCG tablet Take 1,000 mcg by mouth daily.   Yes [provider]  enzalutamide (XTANDI) 80 MG tablet Take 80 mg by mouth daily.   Yes [provider]  escitalopram (LEXAPRO) 20 MG tablet Take 20 mg by mouth daily.   Yes [provider]  ferrous sulfate 325 (65 FE) MG tablet Take 1 tablet (325 mg total) by mouth 2 (two) times daily with a meal. Mon, wed, fri Patient taking differently: Take 325 mg by mouth 2 (two) times daily. 08/15/22  Yes Johnson, Clanford L, MD  finasteride (PROSCAR) 5 MG tablet Take 1  tablet (5 mg total) by mouth daily. 02/23/23  Yes McKenzie, Mardene Celeste, MD  furosemide (LASIX) 20 MG tablet Take 1 tablet (20 mg total) by mouth every other day. Tuesday, Thursday and Saturday 08/15/22 07/14/23 Yes Johnson, Clanford L, MD  loperamide (IMODIUM) 2 MG capsule Take 2 mg by mouth as needed. Once daily as needed   Yes [provider]  mirtazapine (REMERON) 15 MG tablet Take 15 mg by mouth at bedtime.   Yes [provider]  pantoprazole (PROTONIX) 40 MG tablet Take 1 tablet (40 mg total) by mouth 2 (two) times daily. Patient taking differently: Take 40 mg by mouth daily. 08/15/22 08/15/23 Yes Johnson, Clanford L, MD  pregabalin (LYRICA) 75 MG capsule Take 1 capsule (75 mg total) by mouth 2 (two) times daily. 07/09/22  Yes Sharee Holster, NP  topiramate (TOPAMAX) 50 MG tablet Take 0.5 tablets (25 mg total) by mouth 2 (two) times daily. Patient taking differently: Take 50 mg by mouth 2 (two) times daily. 07/09/22  Yes Sharee Holster, NP  vitamin C (ASCORBIC ACID) 250 MG tablet Take 500 mg by mouth daily.   Yes [provider]  zolpidem (AMBIEN) 5 MG tablet Take 5 mg by mouth at bedtime as needed for sleep. 07/09/22  Yes [provider]    Physical Exam: Vitals:   07/14/23 1545 07/14/23 1600 07/14/23 1615 07/14/23 1711  BP: 123/71 132/79 134/62   Pulse:  89 100   Resp: (!) 25 (!) 26 (!) 31   Temp:    98.1 F (36.7 C)  TempSrc:    Rectal  SpO2:  100% 100%   Weight:       Physical Exam:  General: cachectic, very frail HEENT: Normocephalic, atraumatic, Pupils pinpoint and irregular Cardiovascular: Normal rate and rhythm. Distal pulses not palpable, musical systolic murmur. Pulmonary: very weak cough, normal breath sounds Gastrointestinal: Nondistended abdomen, soft, non-tender, normoactive  bowel sounds Musculoskeletal:right club foot. Left foot modeled around toes Lymphadenopathy: No cervical LAD. Skin: abrasion on back of right lower leg.  Onchomycosis Neuro: makes eye contact but does not speak to me.  He does nod his head and follow simple commands.Very hard of hearing. PSYCH: slightly encephalopathic (Stares into space often) but cooperative   Data Reviewed:  Results for orders placed or performed during the hospital encounter of 07/14/23 (from the past 24 hour(s))  CBG monitoring, ED     Status: Abnormal   Collection Time: 07/14/23 12:03 PM  Result Value Ref Range   Glucose-Capillary 136 (H) 70 - 99 mg/dL  Comprehensive metabolic panel     Status: Abnormal   Collection Time: 07/14/23 12:34 PM  Result Value Ref Range   Sodium 140 135 - 145 mmol/L   Potassium 3.4 (L) 3.5 - 5.1 mmol/L   Chloride 101 98 - 111 mmol/L   CO2 23 22 - 32 mmol/L   Glucose, Bld 141 (H) 70 - 99 mg/dL   BUN 34 (H) 8 - 23 mg/dL   Creatinine, Ser 4.09 0.61 - 1.24 mg/dL   Calcium 9.3 8.9 - 81.1 mg/dL   Total Protein 6.7 6.5 - 8.1 g/dL   Albumin 2.5 (L) 3.5 - 5.0 g/dL   AST 10 (L) 15 - 41 U/L   ALT 8 0 - 44 U/L   Alkaline Phosphatase 57 38 - 126 U/L   Total Bilirubin 1.2 0.3 - 1.2 mg/dL   GFR, Estimated >91 >47 mL/min   Anion gap 16 (H) 5 - 15  Lactic acid, plasma     Status: None   Collection Time: 07/14/23 12:34 PM  Result Value Ref Range   Lactic Acid, Venous 1.5 0.5 - 1.9 mmol/L  CBC with Differential     Status: Abnormal   Collection Time: 07/14/23 12:34 PM  Result Value Ref Range   WBC 22.0 (H) 4.0 - 10.5 K/uL   RBC 3.84 (L) 4.22 - 5.81 MIL/uL   Hemoglobin 11.1 (L) 13.0 - 17.0 g/dL   HCT 82.9 (L) 56.2 - 13.0 %   MCV 94.5 80.0 - 100.0 fL   MCH 28.9 26.0 - 34.0 pg   MCHC 30.6 30.0 - 36.0 g/dL   RDW 86.5 78.4 - 69.6 %   Platelets 297 150 - 400 K/uL   nRBC 0.0 0.0 - 0.2 %   Neutrophils Relative % 93 %   Neutro Abs 20.7 (H) 1.7 - 7.7 K/uL   Lymphocytes Relative 2 %   Lymphs Abs 0.4 (L) 0.7 - 4.0 K/uL   Monocytes Relative 4 %   Monocytes Absolute 0.8 0.1 - 1.0 K/uL   Eosinophils Relative 0 %   Eosinophils Absolute 0.0 0.0 -  0.5 K/uL   Basophils Relative 0 %   Basophils Absolute 0.0 0.0 - 0.1 K/uL   Immature Granulocytes 1 %   Abs Immature Granulocytes 0.18 (H) 0.00 - 0.07 K/uL  Type and screen [facility]     Status: None   Collection Time: 07/14/23  1:15 PM  Result Value Ref Range   ABO/RH(D) O POS    Antibody Screen NEG    Sample Expiration      07/17/2023,2359 Performed at [facility], [address] [place], [place] [address]   Lactic acid, plasma     Status: Abnormal   Collection Time: 07/14/23  1:57 PM  Result Value Ref Range   Lactic Acid, Venous 3.5 (HH) 0.5 - 1.9 mmol/L  Troponin I (  High Sensitivity)     Status: Abnormal   Collection Time: 07/14/23  1:57 PM  Result Value Ref Range   Troponin I (High Sensitivity) 58 (H) <18 ng/L  Protime-INR     Status: Abnormal   Collection Time: 07/14/23  1:57 PM  Result Value Ref Range   Prothrombin Time 16.4 (H) 11.4 - 15.2 seconds   INR 1.3 (H) 0.8 - 1.2  Resp panel by RT-PCR (RSV, Flu A&B, Covid) Anterior Nasal Swab     Status: None   Collection Time: 07/14/23  2:22 PM   Specimen: Anterior Nasal Swab  Result Value Ref Range   SARS Coronavirus 2 by RT PCR NEGATIVE NEGATIVE   Influenza A by PCR NEGATIVE NEGATIVE   Influenza B by PCR NEGATIVE NEGATIVE   Resp Syncytial Virus by PCR NEGATIVE NEGATIVE  Blood gas, venous (at [facility] and [facility])     Status: Abnormal   Collection Time: 07/14/23  2:25 PM  Result Value Ref Range   pH, Ven 7.33 7.25 - 7.43   pCO2, Ven 51 44 - 60 mmHg   pO2, Ven <31 (LL) 32 - 45 mmHg   Bicarbonate 27.0 20.0 - 28.0 mmol/L   Acid-Base Excess 0.5 0.0 - 2.0 mmol/L   O2 Saturation 26.9 %   Patient temperature 37.7    Collection site BLOOD RIGHT HAND    Drawn by 442   POC occult blood, ED     Status: None   Collection Time: 07/14/23  3:45 PM  Result Value Ref Range   NEG CONTROL    Urinalysis, w/ Reflex to Culture (Infection Suspected) -Urine, Random     Status: Abnormal   Collection Time: 07/14/23  4:00 PM  Result Value  Ref Range   Specimen Source URINE, CATHETERIZED    Color, Urine YELLOW YELLOW   APPearance HAZY (A) CLEAR   Specific Gravity, Urine 1.026 1.005 - 1.030   pH 5.0 5.0 - 8.0   Glucose, UA NEGATIVE NEGATIVE mg/dL   Hgb urine dipstick SMALL (A) NEGATIVE   Bilirubin Urine NEGATIVE NEGATIVE   Ketones, ur 20 (A) NEGATIVE mg/dL   Protein, ur 086 (A) NEGATIVE mg/dL   Nitrite NEGATIVE NEGATIVE   Leukocytes,Ua MODERATE (A) NEGATIVE   RBC / HPF 6-10 0 - 5 RBC/hpf   WBC, UA >50 0 - 5 WBC/hpf   Bacteria, UA RARE (A) NONE SEEN   Squamous Epithelial / HPF 0-5 0 - 5 /HPF   Mucus PRESENT   Troponin I (High Sensitivity)     Status: Abnormal   Collection Time: 07/14/23  4:16 PM  Result Value Ref Range   Troponin I (High Sensitivity) 60 (H) <18 ng/L     Assessment and Plan: Sepsis Advanced Age PMR Prostate cancer Severe protein calorie malnutrition  - Continue Vanc and cefepime. await cultures. Cough and new oxygen requirement suggest a respiratory source.  Consider Chest CT. - Contact his daughter to go over CODE STATUS.  He is an excellent palliative care or hospice candidate. His life expectancy is very limited. - Hold his Apremilast for now as it is an immunosuppressant. - Hold Lasix     Advance Care Planning:   Code Status: Full Code  By default. Patient does not have capacity.  Consults: none  Family Communication: none  Severity of Illness: The appropriate patient status for this patient is INPATIENT. Inpatient status is judged to be reasonable and necessary in order to provide the required intensity of service to ensure the patient's safety.  The patient's presenting symptoms, physical exam findings, and initial radiographic and laboratory data in the context of their chronic comorbidities is felt to place them at high risk for further clinical deterioration. Furthermore, it is not anticipated that the patient will be medically stable for discharge from the hospital within 2 midnights  of admission.   * I certify that at the point of admission it is my clinical judgment that the patient will require inpatient hospital care spanning beyond 2 midnights from the point of admission due to high intensity of service, high risk for further deterioration and high frequency of surveillance required.*  Author: Buena Irish, MD 07/14/2023 5:36 PM  For on call review [email or web address].

## 2023-07-14 NOTE — Progress Notes (Addendum)
Pharmacy Antibiotic Note  Jared Bridges is a 89 y.o. male admitted on 07/14/2023 with sepsis.  Pharmacy has been consulted for cefepime and vancomycin dosing.  Tmax 99.8, WBC elevated at 22, renal function appears normal.   Plan: Vancomycin 1500 mg IV every 24 hours.  Goal trough 15-20 mcg/mL. - eAUC 535 based on Scr 0.8  Cefepime 2g IV q8h 8/13>>  Weight: 71.2 kg (156 lb 15.5 oz) (06/29/2023 chart note)  Temp (24hrs), Avg:99.8 F (37.7 C), Min:99.8 F (37.7 C), Max:99.8 F (37.7 C)  Recent Labs  Lab 07/14/23 1234  WBC 22.0*  CREATININE 0.66  LATICACIDVEN 1.5    Estimated Creatinine Clearance: 60.6 mL/min (by C-G formula based on SCr of 0.66 mg/dL).    Allergies  Allergen Reactions   Iodine Hives, Itching and Other (See Comments)    very allergic(per daughter), can pre-med with benadryl   Calan [Verapamil] Other (See Comments)    Weakness   Cymbalta [Duloxetine Hcl] Other (See Comments)    Makes patient have jerking motions.   Iodinated Contrast Media Hives, Itching and Other (See Comments)    Can pre-med with benadryl   Latex Other (See Comments)    Redness iritation   Lisinopril Other (See Comments)    Makes patient have jerking motions.   Neurontin [Gabapentin] Other (See Comments)    Makes patient have jerking motions.   Zoloft [Sertraline Hcl] Diarrhea and Other (See Comments)    Makes patient have jerking motions.   Valium [Diazepam] Other (See Comments)    "Did not work"   Flomax [Tamsulosin Hcl] Other (See Comments)    Dizziness   Glimepiride Other (See Comments)    "Did not work"   Melatonin Nausea Only   Tape Other (See Comments)    Skin irritation    Thank you for allowing pharmacy to be a part of this patient's care.  Tory Emerald 07/14/2023 2:06 PM  Sheppard Coil PharmD., BCPS Clinical Pharmacist 07/14/2023 2:32 PM

## 2023-07-14 NOTE — ED Provider Notes (Signed)
[place] EMERGENCY DEPARTMENT AT [facility] Provider Note   CSN: [id number] Arrival date & time: 07/14/23  1157     History {Add pertinent medical, surgical, social history, OB history to HPI:1} Chief Complaint  Patient presents with   Failure To Thrive    Jared Bridges is a 89 y.o. male with multiple comorbidities including hypertension, hyperlipidemia, type 2 diabetes, prostate cancer on Xtandi and Lupron, spinal cord injury C7 with neurogenic bladder with limited movement on the left arm and chronic indwelling Foley catheter present today for evaluation of failure to thrive.  Per his daughter at bedside patient has not eaten since Friday.  He has not talked to her in last couple days.  She said that she notices the patient has had some cough in the last few days and his urine is dark brown in color.  Patient appears confused.  He is awake but do not answer to questions.  HPI     Home Medications Prior to Admission medications   Medication Sig Start Date End Date Taking? Authorizing Provider  albuterol (VENTOLIN HFA) 108 (90 Base) MCG/ACT inhaler Inhale 2 puffs into the lungs every 6 (six) hours as needed for wheezing or shortness of breath. 01/03/23   Briant Cedar, MD  alfuzosin (UROXATRAL) 10 MG 24 hr tablet Take 1 tablet (10 mg total) by mouth daily with breakfast. 02/23/23   McKenzie, Mardene Celeste, MD  Apremilast (OTEZLA) 30 MG TABS Take 30 mg by mouth 2 (two) times daily.    [provider]  atorvastatin (LIPITOR) 40 MG tablet Take 1 tablet (40 mg total) by mouth daily. 07/09/22   Sharee Holster, NP  Calcium 200 MG TABS Take 400 mg by mouth in the morning and at bedtime.    [provider]  Cholecalciferol (VITAMIN D3) 1000 units CAPS Take 1,000 Units by mouth daily.    [provider]  clotrimazole-betamethasone (LOTRISONE) cream APPLY TO THE AFFECTED AREA(S) TWICE DAILY 05/09/23   McKenzie, Mardene Celeste, MD  cyanocobalamin 100 MCG tablet  Take 1,000 mcg by mouth daily.    [provider]  dextromethorphan-guaiFENesin (MUCINEX DM) 30-600 MG 12hr tablet Take 1 tablet by mouth daily as needed for cough.    [provider]  enzalutamide Diana Eves) 80 MG tablet Take 80 mg by mouth daily.    [provider]  escitalopram (LEXAPRO) 20 MG tablet Take 20 mg by mouth daily.    [provider]  ferrous sulfate 325 (65 FE) MG tablet Take 1 tablet (325 mg total) by mouth 2 (two) times daily with a meal. Mon, wed, fri Patient taking differently: Take 325 mg by mouth 2 (two) times daily. 08/15/22   Johnson, Clanford L, MD  finasteride (PROSCAR) 5 MG tablet Take 1 tablet (5 mg total) by mouth daily. 02/23/23   McKenzie, Mardene Celeste, MD  furosemide (LASIX) 20 MG tablet Take 1 tablet (20 mg total) by mouth every other day. Tuesday, Thursday and Saturday 08/15/22 06/03/23  Cleora Fleet, MD  loperamide (IMODIUM) 2 MG capsule Take 2 mg by mouth as needed. Once daily as needed    [provider]  magic mouthwash (nystatin, lidocaine, diphenhydrAMINE, alum & mag hydroxide) suspension Swish and spit 5 mLs 4 (four) times daily as needed for mouth pain. 11/28/22   Pokhrel, Rebekah Chesterfield, MD  pantoprazole (PROTONIX) 40 MG tablet Take 1 tablet (40 mg total) by mouth 2 (two) times daily. Patient taking differently: Take 40 mg by mouth  daily. 08/15/22 08/15/23  Cleora Fleet, MD  pregabalin (LYRICA) 75 MG capsule Take 1 capsule (75 mg total) by mouth 2 (two) times daily. 07/09/22   Sharee Holster, NP  topiramate (TOPAMAX) 50 MG tablet Take 0.5 tablets (25 mg total) by mouth 2 (two) times daily. Patient taking differently: Take 50 mg by mouth 2 (two) times daily. 07/09/22   Sharee Holster, NP  vitamin C (ASCORBIC ACID) 250 MG tablet Take 500 mg by mouth daily.    [provider]  zolpidem (AMBIEN) 5 MG tablet Take 5-10 mg by mouth at bedtime as needed for sleep. 07/09/22   [provider]      Allergies     Iodine, Calan [verapamil], Cymbalta [duloxetine hcl], Iodinated contrast media, Latex, Lisinopril, Neurontin [gabapentin], Zoloft [sertraline hcl], Valium [diazepam], Flomax [tamsulosin hcl], Glimepiride, Melatonin, and Tape    Review of Systems   Review of Systems Negative except as per HPI.  Physical Exam Updated Vital Signs BP 124/71   Pulse 100   Temp 99.8 F (37.7 C)   Resp (!) 24   SpO2 92%  Physical Exam Vitals and nursing note reviewed.  Constitutional:      Appearance: Normal appearance.  HENT:     Head: Normocephalic and atraumatic.     Mouth/Throat:     Mouth: Mucous membranes are moist.  Eyes:     General: No scleral icterus. Cardiovascular:     Rate and Rhythm: Normal rate and regular rhythm.     Pulses: Normal pulses.     Heart sounds: Normal heart sounds.  Pulmonary:     Effort: Pulmonary effort is normal.     Breath sounds: Normal breath sounds.  Abdominal:     General: Abdomen is flat.     Palpations: Abdomen is soft.     Tenderness: There is no abdominal tenderness.  Musculoskeletal:        General: No deformity.  Skin:    General: Skin is warm.     Findings: No rash.  Neurological:     General: No focal deficit present.     Mental Status: He is alert.     Comments: Patient is awake and alert however do not respond to questions.  Psychiatric:        Mood and Affect: Mood normal.     ED Results / Procedures / Treatments   Labs (all labs ordered are listed, but only abnormal results are displayed) Labs Reviewed  CBG MONITORING, ED - Abnormal; Notable for the following components:      Result Value   Glucose-Capillary 136 (*)    All other components within normal limits  CULTURE, BLOOD (ROUTINE X 2)  CULTURE, BLOOD (ROUTINE X 2)  COMPREHENSIVE METABOLIC PANEL  LACTIC ACID, PLASMA  LACTIC ACID, PLASMA  CBC WITH DIFFERENTIAL/PLATELET  PROTIME-INR  URINALYSIS, W/ REFLEX TO CULTURE (INFECTION SUSPECTED)  POC OCCULT BLOOD, ED  TYPE AND  SCREEN    EKG EKG Interpretation Date/Time:  Tuesday July 14 2023 12:17:27 EDT Ventricular Rate:  101 PR Interval:  193 QRS Duration:  85 QT Interval:  365 QTC Calculation: 474 R Axis:   -44  Text Interpretation: indeterminate rhythm Multiple premature complexes, vent & supraven Abnormal R-wave progression, late transition Inferior infarct, old Confirmed by Gloris Manchester [phone]) on 07/14/2023 1:16:34 PM  Radiology No results found.  Procedures Procedures  {Document cardiac monitor, telemetry assessment procedure when appropriate:1}  Medications Ordered in ED Medications - No data to display  ED Course/ Medical Decision Making/ A&P   {   Click here for ABCD2, HEART and other calculatorsREFRESH Note before signing :1}                              Medical Decision Making Amount and/or Complexity of Data Reviewed Labs: ordered. Radiology: ordered.  Risk Prescription drug management.   This patient presents to the ED for failure to thrive, this involves an extensive number of treatment options, and is a complaint that carries with a high risk of complications and morbidity.  The differential diagnosis includes sepsis, pneumonia, UTI, metastatic's disease, electrolyte abnormalities, ICH/CVA, ACS/MI, infectious etiology.  This is not an exhaustive list.  Lab tests: I ordered and personally interpreted labs.  The pertinent results include: WBC 22. Hbg 11.1, around baseline. Platelets unremarkable. Electrolytes unremarkable. BUN, creatinine unremarkable.  First lactate is normal.  Second lactate was 3.5.  pO2 venous 31.  Imaging studies: I ordered imaging studies, personally reviewed, interpreted imaging and agree with the radiologist's interpretations. The results include: Chest x-ray showed chronic airspace disease to the left lower lobe may be secondary to chronic aspiration, otherwise no acute cardiopulmonary disease.  CT head showed no acute intracranial  abnormalities.  Problem list/ ED course/ Critical interventions/ Medical management: HPI: See above Vital signs significant for temp of 100.7, heart rate 108, blood pressure 90/50 with EMS.  Laboratory/imaging studies significant for: See above. On physical examination, patient is awake, alert but does not respond to questions.  Patient met sepsis criteria with hypotension, hypoxia, tachypnea and fever.  Code sepsis activated.  Given broad-spectrum antibiotics.  CT head showed no acute intracranial abnormalities.  Troponin elevated at 58 and 60.  Lactic acid elevated at 3.5.  White count was 22, hemoglobin 11.1 around baseline.  Patient will require admission for failure to thrive, sepsis requiring IV antibiotics.  I will consult [facility] hospitalist team.  Patient's blood pressure is currently in the 120s systolically, he is satting at 100% on 2.5 L. I have reviewed the patient home medicines and have made adjustments as needed.  Cardiac monitoring/EKG: The patient was maintained on a cardiac monitor.  I personally reviewed and interpreted the cardiac monitor which showed an underlying rhythm of: sinus rhythm.  Additional history obtained: External records from outside source obtained and reviewed including: Chart review including previous notes, labs, imaging.  Consultations obtained: I spoke to Dr. Gasper Sells [facility] hospitalist, discussed labs, imaging) and plan.  She agreed to admit the patient.  Disposition Admit This chart was dictated using voice recognition software.  Despite best efforts to proofread,  errors can occur which can change the documentation meaning.    {Document critical care time when appropriate:1} {Document review of labs and clinical decision tools ie heart score, Chads2Vasc2 etc:1}  {Document your independent review of radiology images, and any outside records:1} {Document your discussion with family members, caretakers, and with consultants:1} {Document social  determinants of health affecting pt's care:1} {Document your decision making why or why not admission, treatments were needed:1} Final Clinical Impression(s) / ED Diagnoses Final diagnoses:  Failure to thrive in adult  Sepsis, due to unspecified organism, unspecified whether acute organ dysfunction present [facility])    Rx / DC Orders ED Discharge Orders     None

## 2023-07-14 NOTE — Progress Notes (Addendum)
   07/14/23 2308  Vitals  Temp 97.8 F (36.6 C)  BP 99/61  MAP (mmHg) 71  BP Location Left Arm  BP Method Automatic  Patient Position (if appropriate) Lying  Pulse Rate 65  Resp (!) 29  Level of Consciousness  Level of Consciousness Alert  MEWS COLOR  MEWS Score Color Yellow  Oxygen Therapy  SpO2 97 %  O2 Device Nasal Cannula  O2 Flow Rate (L/min) 5 L/min  MEWS Score  MEWS Temp 0  MEWS Systolic 1  MEWS Pulse 0  MEWS RR 2  MEWS LOC 0  MEWS Score 3  Provider Notification  Provider Garrison Columbus, MD  Date Provider Notified 07/14/23  Time Provider Notified 2328  Method of Notification Page  Notification Reason Other (Comment) (Yellow MEWS)  Provider response No new orders  Date of Provider Response 07/14/23  Time of Provider Response 2340   Order placed by [place],MD for ABG

## 2023-07-14 NOTE — ED Notes (Signed)
PA, Le, notified of Lactic Acid of 3.5.

## 2023-07-15 ENCOUNTER — Encounter (HOSPITAL_COMMUNITY): Payer: Self-pay | Admitting: Internal Medicine

## 2023-07-15 ENCOUNTER — Inpatient Hospital Stay (HOSPITAL_COMMUNITY): Payer: No Typology Code available for payment source

## 2023-07-15 DIAGNOSIS — A419 Sepsis, unspecified organism: Secondary | ICD-10-CM | POA: Diagnosis not present

## 2023-07-15 DIAGNOSIS — Z7189 Other specified counseling: Secondary | ICD-10-CM

## 2023-07-15 DIAGNOSIS — R627 Adult failure to thrive: Secondary | ICD-10-CM

## 2023-07-15 DIAGNOSIS — Z515 Encounter for palliative care: Secondary | ICD-10-CM

## 2023-07-15 LAB — LACTIC ACID, PLASMA: Lactic Acid, Venous: 1.8 mmol/L (ref 0.5–1.9)

## 2023-07-15 LAB — PROCALCITONIN: Procalcitonin: 0.23 ng/mL

## 2023-07-15 LAB — BLOOD GAS, VENOUS
Acid-Base Excess: 0.8 mmol/L (ref 0.0–2.0)
Bicarbonate: 25.3 mmol/L (ref 20.0–28.0)
Drawn by: 1528
O2 Saturation: 96.5 %
Patient temperature: 36.2
pCO2, Ven: 38 mmHg — ABNORMAL LOW (ref 44–60)
pH, Ven: 7.43 (ref 7.25–7.43)
pO2, Ven: 65 mmHg — ABNORMAL HIGH (ref 32–45)

## 2023-07-15 LAB — MAGNESIUM: Magnesium: 1.8 mg/dL (ref 1.7–2.4)

## 2023-07-15 LAB — MRSA NEXT GEN BY PCR, NASAL: MRSA by PCR Next Gen: NOT DETECTED

## 2023-07-15 MED ORDER — SODIUM CHLORIDE 0.9 % IV SOLN
2.0000 g | Freq: Two times a day (BID) | INTRAVENOUS | Status: DC
  Administered 2023-07-15 – 2023-07-16 (×2): 2 g via INTRAVENOUS
  Filled 2023-07-15 (×2): qty 12.5

## 2023-07-15 MED ORDER — POLYMYXIN B-TRIMETHOPRIM 10000-0.1 UNIT/ML-% OP SOLN
1.0000 [drp] | Freq: Four times a day (QID) | OPHTHALMIC | Status: DC
  Administered 2023-07-15 – 2023-07-19 (×15): 1 [drp] via OPHTHALMIC
  Filled 2023-07-15: qty 10

## 2023-07-15 MED ORDER — ALBUMIN HUMAN 25 % IV SOLN
12.5000 g | Freq: Once | INTRAVENOUS | Status: AC
  Administered 2023-07-15: 12.5 g via INTRAVENOUS
  Filled 2023-07-15: qty 50

## 2023-07-15 MED ORDER — LACTATED RINGERS IV SOLN: INTRAVENOUS | Status: DC

## 2023-07-15 MED ORDER — SODIUM CHLORIDE 3 % IN NEBU
4.0000 mL | INHALATION_SOLUTION | Freq: Every day | RESPIRATORY_TRACT | Status: DC
  Administered 2023-07-15 – 2023-07-16 (×2): 4 mL via RESPIRATORY_TRACT
  Filled 2023-07-15 (×2): qty 4

## 2023-07-15 MED ORDER — POTASSIUM CHLORIDE CRYS ER 20 MEQ PO TBCR
40.0000 meq | EXTENDED_RELEASE_TABLET | Freq: Once | ORAL | Status: AC
  Administered 2023-07-15: 40 meq via ORAL
  Filled 2023-07-15: qty 2

## 2023-07-15 MED ORDER — MAGNESIUM SULFATE 4 GM/100ML IV SOLN
4.0000 g | Freq: Once | INTRAVENOUS | Status: AC
  Administered 2023-07-15: 4 g via INTRAVENOUS
  Filled 2023-07-15: qty 100

## 2023-07-15 MED ORDER — IPRATROPIUM-ALBUTEROL 0.5-2.5 (3) MG/3ML IN SOLN
3.0000 mL | Freq: Four times a day (QID) | RESPIRATORY_TRACT | Status: DC
  Administered 2023-07-16 (×2): 3 mL via RESPIRATORY_TRACT
  Filled 2023-07-15 (×2): qty 3

## 2023-07-15 MED ORDER — CHLORHEXIDINE GLUCONATE CLOTH 2 % EX PADS
6.0000 | MEDICATED_PAD | Freq: Every day | CUTANEOUS | Status: DC
  Administered 2023-07-16: 6 via TOPICAL

## 2023-07-15 NOTE — Plan of Care (Signed)
  Problem: Acute Rehab PT Goals(only PT should resolve) Goal: Pt Will Go Supine/Side To Sit 07/15/2023 1524 by Ocie Bob, PT Outcome: Progressing Flowsheets (Taken 07/15/2023 1524) Pt will go Supine/Side to Sit: with moderate assist 07/15/2023 1523 by Ocie Bob, PT Outcome: Progressing Flowsheets (Taken 07/15/2023 1523) Pt will go Supine/Side to Sit: with moderate assist Goal: Patient Will Transfer Sit To/From Stand 07/15/2023 1524 by Ocie Bob, PT Outcome: Progressing Flowsheets (Taken 07/15/2023 1524) Patient will transfer sit to/from stand: with moderate assist 07/15/2023 1523 by Ocie Bob, PT Outcome: Progressing Flowsheets (Taken 07/15/2023 1523) Patient will transfer sit to/from stand: with moderate assist Goal: Pt Will Transfer Bed To Chair/Chair To Bed 07/15/2023 1524 by Ocie Bob, PT Outcome: Progressing Flowsheets (Taken 07/15/2023 1524) Pt will Transfer Bed to Chair/Chair to Bed: with mod assist 07/15/2023 1523 by Ocie Bob, PT Outcome: Progressing Flowsheets (Taken 07/15/2023 1523) Pt will Transfer Bed to Chair/Chair to Bed: with mod assist  3:25 PM, 07/15/23 Ocie Bob, MPT Physical Therapist with [facility] [phone] office [phone] mobile phone

## 2023-07-15 NOTE — Progress Notes (Signed)
PROGRESS NOTE   Jared Bridges  GNF:[id number] DOB: Jun 17, 1930 DOA: 07/14/2023 PCP: Alden Benjamin, MD   Chief Complaint  Patient presents with   Failure To Thrive   Level of care: Telemetry  Brief Admission History:  89 y.o. 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   He has not eaten for the last 4 days and has stopped talking the last 2 days.  She has noticed a cough and also that his urine is darker brown. My history comes entirely from the emergency department provider as the daughter had just left the ER prior to my evaluation.  The patient met sepsis criteria with a temperature of 100.7, blood pressure 90/50, respiratory rate of 32 and tachycardia.  He does not wear oxygen normally but required 2.5 L nasal cannula to have O2 sats of 100%.  His workup in the emergency department revealed an elevated white blood cell count of 22.  His urine is still pending but his chest x-ray revealed only a chronic left lower lobe atelectasis.  Covid negative. He did receive sepsis level fluids and empiric vancomycin, cefepime, and Flagyl.  His vitals improved.  Blood pressure 134/62, pulse 100, temperature 98.1 F (36.7 C), temperature source Rectal, resp. rate (!) 31, weight 71.2 kg, SpO2 100%.    Assessment and Plan:  SIRS Febrile illness of undetermined cause - check procalcitonin - follow cultures  - DC vancomycin as MRSA screen negative  - continue cefepime for now - urinalysis is suspicious, follow up urine culture  - lactic acidosis resolved with hydration - repeat CXR in AM to be sure pneumonia not present  - incentive spirometry encouraged  - ambulate with PT  - palliative care consult for goals of care   Severe protein calorie malnutrition / Adult  Failure to thrive  - palliative care consultation requested   Prostate cancer - holding immunotherapy agents for now - palliative consultation requested - prognosis very guarded with high mortality risk  - can take home supply of enzalutamide (not avail in hospital)  Hypokalemia - oral replacement ordered - replenish Mg  - recheck in AM   Lactic acidosis - resolved with hydration   Conjunctivitis right eye - polytrim eye drops ordered  DVT prophylaxis: SCDs Code Status: Full  Family Communication:  Disposition: TBD    Consultants:  Palliative care   Procedures:   Antimicrobials:  Cefepime 8/13>> Vancomycin 8/13   Subjective: No specific complaints, would not cooperate to answer questions to MD   Objective: Vitals:   07/14/23 2308 07/15/23 0107 07/15/23 0357 07/15/23 0539  BP: 99/61 (!) 124/49 (!) 111/50   Pulse: 65 68 (!) 53   Resp: (!) 29 (!) 26 (!) 24   Temp: 97.8 F (36.6 C)  (!) 97.4 F (36.3 C)   TempSrc:   Oral   SpO2: 97% 100% 96%   Weight:    68 kg    Intake/Output Summary (Last 24 hours) at 07/15/2023 1356 Last data filed at 07/15/2023 8469 Gross per 24 hour  Intake 4835.88 ml  Output 450 ml  Net 4385.88 ml   Filed Weights   07/14/23 1315 07/15/23 0539  Weight: 71.2 kg 68 kg   Examination:  General exam: yellow crust and drainage from both eyes, R>L.  Appears calm and comfortable  Respiratory system: Clear to auscultation. Respiratory effort normal. Cardiovascular system: normal S1 & S2 heard. No JVD, murmurs, rubs, gallops or clicks. No pedal edema. Gastrointestinal system: Abdomen is nondistended, soft and nontender. No organomegaly or masses felt. Normal bowel sounds heard. Central nervous system: Alert and oriented. No focal neurological deficits. Extremities: Symmetric 5 x 5 power. Skin: No rashes, lesions or ulcers. Psychiatry: Judgement and insight appear normal. Mood & affect appropriate.   Data Reviewed: I have personally  reviewed following labs and imaging studies  CBC: Recent Labs  Lab 07/14/23 1234 07/15/23 0531  WBC 22.0* 16.2*  NEUTROABS 20.7*  --   HGB 11.1* 9.2*  HCT 36.3* 30.4*  MCV 94.5 96.5  PLT 297 231    Basic Metabolic Panel: Recent Labs  Lab 07/14/23 1234 07/15/23 0531 07/15/23 0826  NA 140 140  --   K 3.4* 2.9*  --   CL 101 108  --   CO2 23 23  --   GLUCOSE 141* 79  --   BUN 34* 29*  --   CREATININE 0.66 0.47*  --   CALCIUM 9.3 8.7*  --   MG  --   --  1.8    CBG: Recent Labs  Lab 07/14/23 1203  GLUCAP 136*    Recent Results (from the past 240 hour(s))  Culture, blood (Routine x 2)     Status: None (Preliminary result)   Collection Time: 07/14/23  1:15 PM   Specimen: BLOOD  Result Value Ref Range Status   Specimen Description BLOOD BLOOD RIGHT ARM back arm  Final   Special Requests   Final    BOTTLES DRAWN AEROBIC ONLY Blood Culture adequate volume   Culture   Final    NO GROWTH < 24 HOURS Performed at [facility], [address], [place], [place] [address]    Report Status PENDING  Incomplete  Culture, blood (Routine x 2)     Status: None (Preliminary result)   Collection Time: 07/14/23  1:57 PM   Specimen: BLOOD  Result Value Ref Range Status   Specimen Description BLOOD BLOOD RIGHT ARM  Final   Special Requests   Final    BOTTLES DRAWN AEROBIC AND ANAEROBIC Blood Culture adequate volume   Culture   Final    NO GROWTH < 24 HOURS Performed at [facility], [address] [place], [place] [address]    Report Status PENDING  Incomplete  Resp panel by RT-PCR (RSV, Flu A&B, Covid) Anterior Nasal Swab     Status: None   Collection Time: 07/14/23  2:22 PM   Specimen: Anterior Nasal Swab  Result Value Ref Range Status   SARS Coronavirus 2 by RT PCR NEGATIVE NEGATIVE Final    Comment: (NOTE) SARS-CoV-2 target nucleic acids are NOT DETECTED.  The SARS-CoV-2 RNA is generally detectable in upper respiratory specimens during the acute phase of infection. The  lowest concentration of SARS-CoV-2 viral copies this assay can detect is 138 copies/mL. A negative result does not preclude SARS-Cov-2 infection and should not be used as the sole basis for treatment or other patient management decisions. A negative result may occur with  improper specimen collection/handling, submission of specimen other than nasopharyngeal swab, presence of viral mutation(s) within the areas targeted by this assay, and inadequate number of viral copies(<138 copies/mL). A negative result must be combined with clinical observations, patient history, and epidemiological information. The expected result is Negative.  Fact Sheet for Patients:  BloggerCourse.com  Fact Sheet for Healthcare  Providers:  SeriousBroker.it  This test is no t yet approved or cleared by the [place] and  has been authorized for detection and/or diagnosis of SARS-CoV-2 by FDA under an Emergency Use Authorization (EUA). This EUA will remain  in effect (meaning this test can be used) for the duration of the COVID-19 declaration under Section 564(b)(1) of the Act, 21 U.S.C.section 360bbb-3(b)(1), unless the authorization is terminated  or revoked sooner.       Influenza A by PCR NEGATIVE NEGATIVE Final   Influenza B by PCR NEGATIVE NEGATIVE Final    Comment: (NOTE) The Xpert Xpress SARS-CoV-2/FLU/RSV plus assay is intended as an aid in the diagnosis of influenza from Nasopharyngeal swab specimens and should not be used as a sole basis for treatment. Nasal washings and aspirates are unacceptable for Xpert Xpress SARS-CoV-2/FLU/RSV testing.  Fact Sheet for Patients: BloggerCourse.com  Fact Sheet for Healthcare Providers: SeriousBroker.it  This test is not yet approved or cleared by the [place] FDA and has been authorized for detection and/or diagnosis of SARS-CoV-2 by FDA under  an Emergency Use Authorization (EUA). This EUA will remain in effect (meaning this test can be used) for the duration of the COVID-19 declaration under Section 564(b)(1) of the Act, 21 U.S.C. section 360bbb-3(b)(1), unless the authorization is terminated or revoked.     Resp Syncytial Virus by PCR NEGATIVE NEGATIVE Final    Comment: (NOTE) Fact Sheet for Patients: BloggerCourse.com  Fact Sheet for Healthcare Providers: SeriousBroker.it  This test is not yet approved or cleared by the [place] FDA and has been authorized for detection and/or diagnosis of SARS-CoV-2 by FDA under an Emergency Use Authorization (EUA). This EUA will remain in effect (meaning this test can be used) for the duration of the COVID-19 declaration under Section 564(b)(1) of the Act, 21 U.S.C. section 360bbb-3(b)(1), unless the authorization is terminated or revoked.  Performed at [facility], [address], [place], [place] [address]   MRSA Next Gen by PCR, Nasal     Status: None   Collection Time: 07/15/23  8:45 AM   Specimen: Nasal Mucosa; Nasal Swab  Result Value Ref Range Status   MRSA by PCR Next Gen NOT DETECTED NOT DETECTED Final    Comment: (NOTE) The GeneXpert MRSA Assay (FDA approved for NASAL specimens only), is one component of a comprehensive MRSA colonization surveillance program. It is not intended to diagnose MRSA infection nor to guide or monitor treatment for MRSA infections. Test performance is not FDA approved in patients less than 31 years old. Performed at [facility], [address], [place], [place] [address]      Radiology Studies: CT Head Wo Contrast  Result Date: 07/14/2023 CLINICAL DATA:  Altered mental status EXAM: CT HEAD WITHOUT CONTRAST TECHNIQUE: Contiguous axial images were obtained from the base of the skull through the vertex without intravenous contrast. RADIATION DOSE REDUCTION: This exam was performed  according to the departmental dose-optimization program which includes automated exposure control, adjustment of the mA and/or kV according to patient size and/or use of iterative reconstruction technique. COMPARISON:  06/23/2023 FINDINGS: Brain: There is patient motion in some of the images. Additional images were repeated. There are no signs of bleeding within the cranium. There is decreased density in periventricular and subcortical white matter. Cortical sulci are prominent. Vascular: Unremarkable. Skull: No fracture is seen in calvarium. Sinuses/Orbits: There is mild mucosal thickening in the right sphenoid sinus. Other: No significant interval changes are noted. IMPRESSION: No acute intracranial findings  are seen. Atrophy. Small-vessel disease. Mild chronic sphenoid sinusitis. Electronically Signed   By: Ernie Avena M.D.   On: 07/14/2023 15:48   DG Chest Port 1 View  Result Date: 07/14/2023 CLINICAL DATA:  Sepsis. Difficulty breathing with decreased oral intake for 4 days. EXAM: PORTABLE CHEST 1 VIEW COMPARISON:  Radiographs 06/23/2023 and 06/01/2023.  CT 12/31/2022. FINDINGS: 1327 hours. Two views obtained. The heart size and mediastinal contours are stable with aortic atherosclerosis. There is chronic left lower lobe airspace disease with a small left pleural effusion, progressive from the most recent study. The right lung appears clear. No evidence of pneumothorax. No acute osseous findings are seen. There are mild degenerative changes in the spine. Surgical clips are noted within the upper abdomen. IMPRESSION: Progressive chronic left lower lobe airspace disease with associated small left pleural effusion on prior CT. Findings may be secondary to chronic aspiration. Correlate clinically. Atypical malignancy considered less likely. Electronically Signed   By: Carey Bullocks M.D.   On: 07/14/2023 15:06    Scheduled Meds:  alfuzosin  10 mg Oral Q breakfast   vitamin C  500 mg Oral Daily    atorvastatin  40 mg Oral Daily   docusate sodium  100 mg Oral BID   enzalutamide  80 mg Oral Daily   finasteride  5 mg Oral Daily   mirtazapine  15 mg Oral QHS   pantoprazole  40 mg Oral Daily   potassium chloride  40 mEq Oral Once   pregabalin  75 mg Oral BID   topiramate  50 mg Oral BID   trimethoprim-polymyxin b  1 drop Both Eyes Q6H   Continuous Infusions:  ceFEPime (MAXIPIME) IV     magnesium sulfate bolus IVPB       LOS: 1 day   Time spent: 41 mins   Laural Benes, MD How to contact the [facility] Attending or Consulting provider 7A - 7P or covering provider during after hours 7P -7A, for this patient?  Check the care team in [facility] and look for a) attending/consulting [facility] provider listed and b) the [facility] team listed Log into [email or web address] and use [place]'s universal password to access. If you do not have the password, please contact the hospital operator. Locate the [facility] provider you are looking for under [facility] and page to a number that you can be directly reached. If you still have difficulty reaching the provider, please page the [facility] (Director on Call) for the Hospitalists listed on amion for assistance.  07/15/2023, 1:56 PM

## 2023-07-15 NOTE — Progress Notes (Signed)
Patient rested through the night, vital signs have now returned back to a green MEWS, patient remains on LR at 154mL/hr and IV ABX. Spoke with daughter [name] over the phone about her fathers admittance to the med surge unit.

## 2023-07-15 NOTE — Consult Note (Signed)
Consultation Note Date: 07/15/2023   Patient Name: Jared Bridges  DOB: 1930-05-15  MRN: [id number]  Age / Sex: 89 y.o., male  PCP: Alden Benjamin, MD Referring Physician: Cleora Fleet, MD  Reason for Consultation: Establishing goals of care  HPI/Patient Profile: 89 y.o. male  with past medical history of polymyalgia rheumatica, DM2, GI bleed, prostate cancer on Xtandi, MGUS, chronic indwelling Foley due to neurogenic bladder after C7 spinal cord injury, iron deficiency anemia, HTN/HLD, PUD, peripheral neuropathy, admitted on 07/14/2023 with SIRS, febrile illness of undetermined cause with severe protein calorie malnutrition and adult failure to thrive.   Clinical Assessment and Goals of Care: I have reviewed medical records including EPIC notes, labs and imaging, received report from RN, assessed the patient.  Jared Bridges is lying quietly in bed.  He appears acutely/chronically ill and quite frail.  He is resting comfortably but wakes easily when I call his name.  He greets me with a smile, making eye contact but unable to keep eye contact to due to severe cough.  He has a prolonged very wet cough that he is unable to clear.  He is unable to communicate with me in any meaningful way due to cough.  I encouraged him to rest and relax.  I share that I will call his daughter, Jared Bridges, to which he nods in agreement.  Call to daughter/HCPOA, [name], to discuss diagnosis prognosis, GOC, EOL wishes, disposition and options.  No answer, left voicemail message.  Share that I will continue to reach out.  Jared Bridges and Jared Bridges have had palliative meetings with this NP and the palliative team and general in the past.  Conference with attending, bedside nursing staff, transition of care team related to patient condition, needs, goals of care, disposition.   HCPOA HCPOA -daughter, Jared Bridges.  Mr. Kush is still  married but his wife is experiencing memory loss    SUMMARY OF RECOMMENDATIONS   At this point continue full scope/full code Time for outcomes Anticipate returning home with home health services already in place Would benefit from outpatient palliative services [facility]   Code Status/Advance Care Planning: Full code  Symptom Management:  Per hospitalist, no additional needs at this time.  Palliative Prophylaxis:  Frequent Pain Assessment and Oral Care  Additional Recommendations (Limitations, Scope, Preferences): Full Scope Treatment  Psycho-social/Spiritual:  Desire for further Chaplaincy support:no Additional Recommendations: Caregiving  Support/Resources  Prognosis:  Unable to determine, based on outcomes.  3 to 6 months or less would not be surprising based on acuity of condition, frailty, decreasing functional status.  Discharge Planning: To Be Determined      Primary Diagnoses: Present on Admission:  Sepsis ([name]   I have reviewed the medical record, interviewed the patient and family, and examined the patient. The following aspects are pertinent.  Past Medical History:  Diagnosis Date   Acute cholecystitis 04/05/2016   Anemia, iron deficiency    Chronic headaches    Migraines   Chronic kidney disease  Resolved   Depression    Diverticulitis    Essential hypertension    GI bleed    Dr. Karilyn Cota - 1998   Hypercholesteremia    Orthostatic hypotension    Peptic ulcer disease    Peripheral neuropathy    Polymyalgia rheumatica ([name]    Prostate cancer ([name]    Rectal bleeding 11/26/2017   Added automatically from request for surgery [id number]   Stroke [facility])    Residual trouble reading and writing   Syncope    Thyroid nodule    TIA (transient ischemic attack) 12/2017   Type 2 diabetes mellitus ([name]    Upper GI bleed 06/18/2012   Social History   Socioeconomic History   Marital status: Married    Spouse name: Not on file   Number of  children: Not on file   Years of education: Not on file   Highest education level: Not on file  Occupational History   Not on file  Tobacco Use   Smoking status: Former    Current packs/day: 0.00    Average packs/day: 0.1 packs/day for 28.0 years (2.8 ttl pk-yrs)    Types: Cigars, Cigarettes    Start date: 12/18/1958    Quit date: 12/01/1986    Years since quitting: 36.6   Smokeless tobacco: Never   Tobacco comments:    Smoke cigars daily per daughter.  Vaping Use   Vaping status: Never Used  Substance and Sexual Activity   Alcohol use: No    Alcohol/week: 0.0 standard drinks of alcohol   Drug use: No   Sexual activity: Not Currently  Other Topics Concern   Not on file  Social History Narrative   Married for 59 years.Retired Press photographer.   Social Determinants of Health   Financial Resource Strain: Not on file  Food Insecurity: Patient Unable To Answer (07/14/2023)   Hunger Vital Sign    Worried About Running Out of Food in the Last Year: Patient unable to answer    Ran Out of Food in the Last Year: Patient unable to answer  Transportation Needs: Patient Unable To Answer (07/14/2023)   PRAPARE - Administrator, Civil Service (Medical): Patient unable to answer    Lack of Transportation (Non-Medical): Patient unable to answer  Physical Activity: Not on file  Stress: Not on file  Social Connections: Not on file   Family History  Problem Relation Age of Onset   Colon cancer Sister    Hypertension Other    Depression Brother    Depression Daughter    Scheduled Meds:  alfuzosin  10 mg Oral Q breakfast   vitamin C  500 mg Oral Daily   atorvastatin  40 mg Oral Daily   docusate sodium  100 mg Oral BID   enzalutamide  80 mg Oral Daily   finasteride  5 mg Oral Daily   mirtazapine  15 mg Oral QHS   pantoprazole  40 mg Oral Daily   potassium chloride  40 mEq Oral Once   pregabalin  75 mg Oral BID   topiramate  50 mg Oral BID   trimethoprim-polymyxin b  1  drop Both Eyes Q6H   Continuous Infusions:  ceFEPime (MAXIPIME) IV     lactated ringers     magnesium sulfate bolus IVPB     PRN Meds:.acetaminophen **OR** acetaminophen, albuterol, ondansetron **OR** ondansetron (ZOFRAN) IV, sorbitol, zolpidem Medications Prior to Admission:  Prior to Admission medications   Medication Sig Start Date End Date Taking?  Authorizing Provider  albuterol (VENTOLIN HFA) 108 (90 Base) MCG/ACT inhaler Inhale 2 puffs into the lungs every 6 (six) hours as needed for wheezing or shortness of breath. 01/03/23  Yes Briant Cedar, MD  alfuzosin (UROXATRAL) 10 MG 24 hr tablet Take 1 tablet (10 mg total) by mouth daily with breakfast. 02/23/23  Yes McKenzie, Mardene Celeste, MD  Apremilast (OTEZLA) 30 MG TABS Take 30 mg by mouth 2 (two) times daily.   Yes [provider]  atorvastatin (LIPITOR) 40 MG tablet Take 1 tablet (40 mg total) by mouth daily. 07/09/22  Yes Sharee Holster, NP  Calcium 200 MG TABS Take 400 mg by mouth in the morning and at bedtime.   Yes [provider]  Cholecalciferol (VITAMIN D3) 1000 units CAPS Take 1,000 Units by mouth daily.   Yes [provider]  cyanocobalamin 100 MCG tablet Take 1,000 mcg by mouth daily.   Yes [provider]  enzalutamide (XTANDI) 80 MG tablet Take 80 mg by mouth daily.   Yes [provider]  escitalopram (LEXAPRO) 20 MG tablet Take 20 mg by mouth daily.   Yes [provider]  ferrous sulfate 325 (65 FE) MG tablet Take 1 tablet (325 mg total) by mouth 2 (two) times daily with a meal. Mon, wed, fri Patient taking differently: Take 325 mg by mouth 2 (two) times daily. 08/15/22  Yes Johnson, Clanford L, MD  finasteride (PROSCAR) 5 MG tablet Take 1 tablet (5 mg total) by mouth daily. 02/23/23  Yes McKenzie, Mardene Celeste, MD  furosemide (LASIX) 20 MG tablet Take 1 tablet (20 mg total) by mouth every other day. Tuesday, Thursday and Saturday 08/15/22 07/14/23 Yes Johnson, Clanford L, MD   loperamide (IMODIUM) 2 MG capsule Take 2 mg by mouth as needed. Once daily as needed   Yes [provider]  mirtazapine (REMERON) 15 MG tablet Take 15 mg by mouth at bedtime.   Yes [provider]  pantoprazole (PROTONIX) 40 MG tablet Take 1 tablet (40 mg total) by mouth 2 (two) times daily. Patient taking differently: Take 40 mg by mouth daily. 08/15/22 08/15/23 Yes Johnson, Clanford L, MD  pregabalin (LYRICA) 75 MG capsule Take 1 capsule (75 mg total) by mouth 2 (two) times daily. 07/09/22  Yes Sharee Holster, NP  topiramate (TOPAMAX) 50 MG tablet Take 0.5 tablets (25 mg total) by mouth 2 (two) times daily. Patient taking differently: Take 50 mg by mouth 2 (two) times daily. 07/09/22  Yes Sharee Holster, NP  vitamin C (ASCORBIC ACID) 250 MG tablet Take 500 mg by mouth daily.   Yes [provider]  zolpidem (AMBIEN) 5 MG tablet Take 5 mg by mouth at bedtime as needed for sleep. 07/09/22  Yes [provider]   Allergies  Allergen Reactions   Iodine Hives, Itching and Other (See Comments)    very allergic(per daughter), can pre-med with benadryl   Calan [Verapamil] Other (See Comments)    Weakness   Cymbalta [Duloxetine Hcl] Other (See Comments)    Makes patient have jerking motions.   Iodinated Contrast Media Hives, Itching and Other (See Comments)    Can pre-med with benadryl   Latex Other (See Comments)    Redness iritation   Lisinopril Other (See Comments)    Makes patient have jerking motions.   Neurontin [Gabapentin] Other (See Comments)    Makes patient have jerking motions.   Zoloft [Sertraline Hcl] Diarrhea and Other (See Comments)    Makes  patient have jerking motions.   Valium [Diazepam] Other (See Comments)    "Did not work"   Flomax [Tamsulosin Hcl] Other (See Comments)    Dizziness   Glimepiride Other (See Comments)    "Did not work"   Melatonin Nausea Only   Tape Other (See Comments)    Skin irritation   Review of Systems  Unable  to perform ROS: Acuity of condition    Physical Exam Vitals reviewed.  Constitutional:      General: He is in acute distress.     Appearance: He is ill-appearing.  Pulmonary:     Comments: Very wet productive cough, unable to clear Neurological:     Mental Status: He is alert.     Comments: Unable to ask orientation questions due to acuity     Vital Signs: BP (!) 111/50 (BP Location: Left Arm)   Pulse (!) 53   Temp (!) 97.4 F (36.3 C) (Oral)   Resp (!) 24   Wt 68 kg   SpO2 96%   BMI 19.78 kg/m  Pain Scale: 0-10   Pain Score: 0-No pain   SpO2: SpO2: 96 % O2 Device:SpO2: 96 % O2 Flow Rate: .O2 Flow Rate (L/min): 4 L/min  IO: Intake/output summary:  Intake/Output Summary (Last 24 hours) at 07/15/2023 1445 Last data filed at 07/15/2023 1610 Gross per 24 hour  Intake 4735.88 ml  Output 450 ml  Net 4285.88 ml    LBM: Last BM Date :  (patient unable to answer) Baseline Weight: Weight: 71.2 kg (06/29/2023 chart note) Most recent weight: Weight: 68 kg     Palliative Assessment/Data:     Time In: 1330 Time Out: 1410 Time Total: 40 minutes  Greater than 50%  of this time was spent counseling and coordinating care related to the above assessment and plan.  Signed by: Katheran Awe, NP   Please contact Palliative Medicine Team phone at [phone] for questions and concerns.  For individual provider: See Loretha Stapler

## 2023-07-15 NOTE — Progress Notes (Signed)
   07/15/23 2016  Vitals  Temp 98.3 F (36.8 C)  Temp Source Axillary  BP (!) 91/45  MAP (mmHg) (!) 58  BP Location Left Arm  BP Method Automatic  Patient Position (if appropriate) Lying  Pulse Rate 89  Pulse Rate Source Dinamap  Resp (!) 28  Level of Consciousness  Level of Consciousness Responds to Voice  MEWS COLOR  MEWS Score Color Red  Oxygen Therapy  SpO2 100 %  O2 Device Nasal Cannula  O2 Flow Rate (L/min) 4 L/min  MEWS Score  MEWS Temp 0  MEWS Systolic 1  MEWS Pulse 0  MEWS RR 2  MEWS LOC 1  MEWS Score 4   Patients lungs sounding wet per respiratory and patient presenting a RES Mews, Dr. Carren Rang notified, see new orders.

## 2023-07-15 NOTE — Hospital Course (Addendum)
89 y.o. 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  Had not eaten for the last 4 days and has stopped talking the last 2 days.  She has noticed a cough and also that his urine is darker brown. 08/13: On admission, patient met sepsis criteria with a temperature of 100.7, blood pressure 90/50, respiratory rate of 32 and tachycardia.  He does not wear oxygen normally but required 2.5 L nasal cannula to have O2 sats of 100%.  His workup in the emergency department revealed an elevated white blood cell count of 22.  Chest x-ray revealed only a chronic left lower lobe atelectasis.  Covid negative. He did receive sepsis level fluids and empiric vancomycin, cefepime, and Flagyl.   08/14-08/16: Patient was found to have sepsis secondary to Pseudomonas aeruginosa UTI, received antibiotic appropriately but continued to deteriorate. Palliative care was consulted and patient was transitioned to comfort care.  08/17: [facility] hosp[facility]. 08/18: ok for hospice transfer today    Consultants:  Palliative Care   Procedures: none      ASSESSMENT & PLAN:   Principal Problem:   Sepsis ([name]   Sepsis from pseudomonas aeruginosa UTI  Patient was initially received broad-spectrum antibiotics followed by cefepime based on sensitivity results. Later family withdraw the active management and transition him to comfort care. confirmed hospice facility placement   Severe protein calorie malnutrition / Adult Failure to thrive  Patient has been transitioned to comfort care now   Prostate cancer prognosis very guarded with high mortality risk  Patient is comfort care only now   Hypokalemia Lactic acidosis Resolved No labs on comfort measures     Conjunctivitis right eye polytrim eye drops ordered with clinical improvement seen      DVT prophylaxis: n/a on comfort measures Pertinent IV fluids/nutrition: limit IV fluids, diet as able/desired  Central lines / invasive devices: Foley cath for comfort    Code Status: DNR ACP documentation reviewed: 07/18/23 no active forms on [facility]   Current Admission Status: inpatient  TOC needs / Dispo plan[facility] Barriers to discharge / significant pending items: placement

## 2023-07-15 NOTE — Evaluation (Signed)
Physical Therapy Evaluation Patient Details Name: Jared Bridges MRN: [id number] DOB: [birth year] Today's Date: 07/15/2023  History of Present Illness  Jared Bridges is a 89 y.o. 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    He has not eaten for the last 4 days and has stopped talking the last 2 days.  She has noticed a cough and also that his urine is darker brown.  My history comes entirely from the emergency department provider as the daughter had just left the ER prior to my evaluation.  The patient met sepsis criteria with a temperature of 100.7, blood pressure 90/50, respiratory rate of 32 and tachycardia.  He does not wear oxygen normally but required 2.5 L nasal cannula to have O2 sats of 100%.  His workup in the emergency department revealed an elevated white blood cell count of 22.  His urine is still pending but his chest x-ray revealed only a chronic left lower lobe atelectasis.  Covid negative. He did receive sepsis level fluids and empiric vancomycin, cefepime, and Flagyl.  His vitals improved.   Blood pressure 134/62, pulse 100, temperature 98.1 F (36.7 C), temperature source Rectal, resp. rate (!) 31, weight 71.2 kg, SpO2 100%.      At this time we do not have a source for his sepsis but he will be admitted for continued antibiotics and work up.   Clinical Impression  Patient unable to maintain sitting balance or move legs due to severe posterior leaning and unable to flex trunk.  Patient put back to bed with Max assist for repositioning.  Patient will benefit from continued skilled physical therapy in hospital and recommended venue below to increase strength, balance, endurance for safe ADLs and gait.       If plan is discharge home, recommend the  following: A lot of help with bathing/dressing/bathroom;A lot of help with walking and/or transfers;Two people to help with walking and/or transfers;Two people to help with bathing/dressing/bathroom   Can travel by private vehicle   No    Equipment Recommendations Other (comment)  Recommendations for Other Services       Functional Status Assessment Patient has had a recent decline in their functional status and demonstrates the ability to make significant improvements in function in a reasonable and predictable amount of time.     Precautions / Restrictions Precautions Precautions: Fall Restrictions Weight Bearing Restrictions: No      Mobility  Bed Mobility Overal bed mobility: Needs Assistance Bed Mobility: Supine to Sit     Supine to sit: Max assist     General bed mobility comments: Max assist for partial long sitting due to severe posterior leaning, stiffness    Transfers                        Ambulation/Gait                  Stairs            Wheelchair Mobility     Tilt Bed    Modified Rankin (Stroke Patients Only)       Balance Overall balance assessment: Needs assistance Sitting-balance support: Feet unsupported, No upper extremity supported Sitting balance-Leahy Scale: Poor Sitting balance - Comments: unable to sit at bedside  Postural control: Posterior lean                                   Pertinent Vitals/Pain Pain Assessment Pain Assessment: Faces Faces Pain Scale: Hurts little more Pain Location: BLE with movement Pain Descriptors / Indicators: Grimacing, Guarding Pain Intervention(s): Limited activity within patient's tolerance, Monitored during session, Repositioned    Home Living Family/patient expects to be discharged to:: Private residence Living Arrangements: Spouse/significant other;Children Available Help at Discharge: Family;Available 24 hours/day Type of Home: House Home Access:  Stairs to enter;Ramped entrance Entrance Stairs-Rails: Can reach both Entrance Stairs-Number of Steps: 5 Alternate Level Stairs-Number of Steps: 17 Home Layout: Two level;Bed/bath upstairs;Able to live on main level with bedroom/bathroom;Full bath on main level Home Equipment: Rollator (4 wheels);Rolling Walker (2 wheels);Shower seat;Wheelchair - power;Other (comment) (mechanical lift) Additional Comments: has Mechanical lift    Prior Function Prior Level of Function : Needs assist       Physical Assist : Mobility (physical);ADLs (physical) Mobility (physical): Bed mobility;Transfers;Gait;Stairs   Mobility Comments: Patient non-ambulatory, assisted for transfers to his wheelchair ADLs Comments: assisted by family     Extremity/Trunk Assessment   Upper Extremity Assessment Upper Extremity Assessment: Generalized weakness    Lower Extremity Assessment Lower Extremity Assessment: Generalized weakness    Cervical / Trunk Assessment Cervical / Trunk Assessment: Kyphotic  Communication   Communication Communication: Other (comment) (non verbal)  Cognition Arousal: Obtunded Behavior During Therapy: Flat affect Overall Cognitive Status: No family/caregiver present to determine baseline cognitive functioning                                          General Comments      Exercises     Assessment/Plan    PT Assessment Patient needs continued PT services  PT Problem List Decreased strength;Decreased activity tolerance;Decreased balance;Decreased mobility       PT Treatment Interventions DME instruction;Functional mobility training;Therapeutic activities;Therapeutic exercise;Balance training;Manual techniques;Patient/family education    PT Goals (Current goals can be found in the Care Plan section)  Acute Rehab PT Goals Patient Stated Goal: not stated PT Goal Formulation: With patient Time For Goal Achievement: 07/29/23 Potential to Achieve Goals:  Fair    Frequency Min 3X/week     Co-evaluation               AM-PAC PT "6 Clicks" Mobility  Outcome Measure Help needed turning from your back to your side while in a flat bed without using bedrails?: None Help needed moving from lying on your back to sitting on the side of a flat bed without using bedrails?: None Help needed moving to and from a bed to a chair (including a wheelchair)?: Total Help needed standing up from a chair using your arms (e.g., wheelchair or bedside chair)?: Total Help needed to walk in hospital room?: Total Help needed climbing 3-5 steps with a railing? : Total 6 Click Score: 12    End of Session   Activity Tolerance: Patient tolerated treatment well;Patient limited by fatigue Patient left: in bed;with call bell/phone within reach;with bed alarm set Nurse Communication: Mobility status PT Visit Diagnosis: Unsteadiness on feet (R26.81);Other abnormalities of gait and mobility (R26.89);Muscle weakness (generalized) (M62.81)    Time: 1440-1501 PT Time Calculation (min) (ACUTE ONLY): 21 min   Charges:   PT Evaluation $PT  Eval Moderate Complexity: 1 Mod PT Treatments $Therapeutic Activity: 8-22 mins PT General Charges $$ ACUTE PT VISIT: 1 Visit         3:21 PM, 07/15/23 Ocie Bob, MPT Physical Therapist with [facility] [phone] office [phone] mobile phone

## 2023-07-15 NOTE — Progress Notes (Signed)
Patient alert and awake at this time, patient just stated he is feeling better, speech no longer incomprehensible.

## 2023-07-15 NOTE — Progress Notes (Signed)
Tele just called stating patient had a first degree heart block, order to obtain 12 lead

## 2023-07-15 NOTE — TOC Initial Note (Signed)
Transition of Care [facility]) - Initial/Assessment Note    Patient Details  Name: Jared Bridges MRN: [id number] Date of Birth: 05/09/1930  Transition of Care [facility]) CM/SW Contact:    [name], LCSW Phone Number: 07/15/2023, 11:18 AM  Clinical Narrative:                 Patient from home with daughter. Considered high risk for readmission. Active with Cindie Laroche RN, OT, aide. Has electric lift and electric wheelchair. Has cath.  [facility] notification completed Your notification ID is: [id number]  Expected Discharge Plan: Home w Home Health Services Barriers to Discharge: Continued Medical Work up   Patient Goals and CMS Choice Patient states their goals for this hospitalization and ongoing recovery are:: return home          Expected Discharge Plan and Services       Living arrangements for the past 2 months: Single Family Home                                      Prior Living Arrangements/Services Living arrangements for the past 2 months: Single Family Home Lives with:: Adult Children Patient language and need for interpreter reviewed:: Yes        Need for Family Participation in Patient Care: Yes (Comment) Care giver support system in place?: Yes (comment) Current home services: DME (electric lift, electric wheelchair) Criminal Activity/Legal Involvement Pertinent to Current Situation/Hospitalization: No - Comment as needed  Activities of Daily Living Home Assistive Devices/Equipment: None ADL Screening (condition at time of admission) Patient's cognitive ability adequate to safely complete daily activities?: No Is the patient deaf or have difficulty hearing?: Yes Does the patient have difficulty seeing, even when wearing glasses/contacts?: Yes Does the patient have difficulty concentrating, remembering, or making decisions?: Yes Patient able to express need for assistance with ADLs?: No Does the patient have difficulty dressing or bathing?:  Yes Independently performs ADLs?: No Communication: Dependent Is this a change from baseline?: Pre-admission baseline Dressing (OT): Dependent Is this a change from baseline?: Pre-admission baseline Grooming: Dependent Is this a change from baseline?: Pre-admission baseline Feeding: Dependent Is this a change from baseline?: Pre-admission baseline Bathing: Dependent Is this a change from baseline?: Pre-admission baseline Toileting: Dependent Is this a change from baseline?: Pre-admission baseline In/Out Bed: Dependent Is this a change from baseline?: Pre-admission baseline Walks in Home: Dependent Is this a change from baseline?: Pre-admission baseline Does the patient have difficulty walking or climbing stairs?: Yes Weakness of Legs: Both Weakness of Arms/Hands: Both  Permission Sought/Granted Permission sought to share information with : Family Supports    Share Information with NAME: daughter, Elvia Biehle           Emotional Assessment     Affect (typically observed): Appropriate   Alcohol / Substance Use: Not Applicable Psych Involvement: No (comment)  Admission diagnosis:  Failure to thrive in adult [R62.7] Sepsis ([name] [A41.9] Sepsis, due to unspecified organism, unspecified whether acute organ dysfunction present [facility]) [A41.9] Patient Active Problem List   Diagnosis Date Noted   Sepsis ([name] 07/14/2023   Hypokalemia 06/02/2023   Bacteremia 06/02/2023   Acute metabolic encephalopathy 03/25/2023   History of Clostridioides difficile colitis 01/05/2023   Acute respiratory failure with hypoxia ([name] 12/31/2022   Pneumonia 12/31/2022   Generalized weakness 11/27/2022   C. difficile diarrhea 11/26/2022   Acute urinary retention 11/26/2022   Decreased appetite 11/22/2022  Atelectasis 08/10/2022   Iron deficiency anemia secondary to blood loss (chronic) 08/08/2022   Grade I diastolic dysfunction 07/01/2022   Hyperlipidemia associated with type 2 diabetes  mellitus ([name] 06/26/2022   Hypertension associated with type 2 diabetes mellitus ([name] 06/26/2022   History of CVA (cerebrovascular accident) 06/26/2022   Vascular dementia without behavioral disturbance ([name] 06/26/2022   Neurodegenerative cognitive impairment ([name] 06/26/2022   Normochromic normocytic anemia    Mixed hyperlipidemia 06/16/2022   Chronic diastolic CHF (congestive heart failure) ([name] 06/16/2022   GERD (gastroesophageal reflux disease) 06/16/2022   Loss of weight 03/10/2022   History of peptic ulcer disease 09/03/2021   Wheelchair dependence 09/02/2021   Quadriplegia ([name] 09/02/2021   Sleep disturbance    Controlled type 2 diabetes mellitus with hyperglycemia, without long-term current use of insulin ([name]    Central cord syndrome at C7 level of cervical spinal cord ([name] 07/05/2021   Epidural hematoma ([name] 07/02/2021   Lower urinary tract symptoms (LUTS) 10/31/2020   Prostate cancer ([name] 06/20/2020   Elevated PSA 04/18/2020   Cobalamin deficiency 02/22/2020   Intractable chronic migraine without aura 02/20/2020   Altered mental status 02/20/2020   Asthenia 02/20/2020   Diabetic peripheral neuropathy ([name] 02/20/2020   Obstructive sleep apnea of adult 02/20/2020   Medicare annual wellness visit, subsequent 12/07/2019   Depressed mood 12/07/2019   Vitamin D deficiency 10/25/2019   Memory loss 10/25/2019   Fall 10/25/2019   Nonrheumatic aortic valve stenosis 03/23/2018   Migraine headaches 12/29/2017   TIA (transient ischemic attack) 12/28/2017   Carpal tunnel syndrome, left 07/22/2017   Acquired trigger finger 07/22/2017   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[name] 02/28/2016   Total knee replacement status 02/28/2016   CVA (cerebral infarction) 12/19/2015   Carotid stenosis 12/19/2015   Wound infection after surgery 06/29/2015   OA (osteoarthritis) of knee 05/28/2015   Orthostatic hypotension 11/21/2014   Dizziness 11/16/2014   Polymyalgia rheumatica  ([name] 09/12/2014   UTI (urinary tract infection) 08/16/2014   Dehydration 08/16/2014   Iron deficiency anemia due to chronic blood loss 08/16/2014   DJD (degenerative joint disease) 08/16/2014   Essential hypertension    Cataract, nuclear 04/11/2014   Complaining of back-related symptom 03/15/2014   Arthralgia of hip or thigh 03/15/2014   Gonalgia 10/10/2013   Left knee pain 10/10/2013   1st degree AV block 08/04/2013   Infection of the upper respiratory tract 04/05/2013   Cataract 02/24/2013   Pseudoaphakia 02/24/2013   Episode of syncope 01/11/2013   Cardiac murmur 11/09/2012   Near syncope 11/09/2012   Anxiety 06/18/2012   Depression, recurrent ([name] 06/18/2012   Difficulty hearing 06/18/2012   Adaptive colitis 06/18/2012   Arthritis, degenerative 06/18/2012   Hearing impairment 06/18/2012   Multinodular goiter 01/29/2012   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 - [place], [place] - [address] [place] [address] Phone: [phone] Fax: [phone]  [facility] - [place], [place] - [address] [place] [address] Phone: [phone] Fax: [phone]     Social Determinants of Health (SDOH) Social History: SDOH Screenings   Food Insecurity: Patient Unable To Answer (07/14/2023)  Housing: High Risk (07/14/2023)  Transportation Needs: Patient Unable To Answer (07/14/2023)  Utilities: Patient Unable To Answer (07/14/2023)  Depression (PHQ2-9): Low Risk  (01/03/2021)  Tobacco Use: Medium Risk (07/14/2023)   SDOH Interventions:     Readmission Risk Interventions    06/03/2023     8:15 AM 03/26/2023   10:59 AM 01/02/2023   12:22 PM  Readmission Risk Prevention Plan  Transportation Screening Complete Complete Complete  Medication Review [employer]) Complete Complete Complete  HRI or Home Care Consult Complete Complete Complete  SW Recovery Care/Counseling Consult Complete  Complete  Palliative Care Screening Not Applicable Not Applicable Not Applicable  Skilled Nursing Facility Not Applicable Not Applicable Not Applicable

## 2023-07-16 ENCOUNTER — Inpatient Hospital Stay (HOSPITAL_COMMUNITY): Payer: No Typology Code available for payment source

## 2023-07-16 DIAGNOSIS — A419 Sepsis, unspecified organism: Secondary | ICD-10-CM | POA: Diagnosis not present

## 2023-07-16 DIAGNOSIS — A4150 Gram-negative sepsis, unspecified: Secondary | ICD-10-CM

## 2023-07-16 DIAGNOSIS — R627 Adult failure to thrive: Secondary | ICD-10-CM | POA: Diagnosis not present

## 2023-07-16 DIAGNOSIS — Z515 Encounter for palliative care: Secondary | ICD-10-CM | POA: Diagnosis not present

## 2023-07-16 DIAGNOSIS — N390 Urinary tract infection, site not specified: Secondary | ICD-10-CM

## 2023-07-16 DIAGNOSIS — E876 Hypokalemia: Secondary | ICD-10-CM

## 2023-07-16 DIAGNOSIS — H1030 Unspecified acute conjunctivitis, unspecified eye: Secondary | ICD-10-CM

## 2023-07-16 LAB — PROCALCITONIN: Procalcitonin: 0.22 ng/mL

## 2023-07-16 LAB — COMPREHENSIVE METABOLIC PANEL
ALT: 7 U/L (ref 0–44)
AST: 11 U/L — ABNORMAL LOW (ref 15–41)
Albumin: 2.1 g/dL — ABNORMAL LOW (ref 3.5–5.0)
Alkaline Phosphatase: 49 U/L (ref 38–126)
Anion gap: 7 (ref 5–15)
BUN: 23 mg/dL (ref 8–23)
CO2: 24 mmol/L (ref 22–32)
Calcium: 8.6 mg/dL — ABNORMAL LOW (ref 8.9–10.3)
Chloride: 108 mmol/L (ref 98–111)
Creatinine, Ser: 0.55 mg/dL — ABNORMAL LOW (ref 0.61–1.24)
GFR, Estimated: >60 mL/min (ref >60)
Glucose, Bld: 145 mg/dL — ABNORMAL HIGH (ref 70–99)
Potassium: 3.5 mmol/L (ref 3.5–5.1)
Sodium: 139 mmol/L (ref 135–145)
Total Bilirubin: 0.4 mg/dL (ref 0.3–1.2)
Total Protein: 5.2 g/dL — ABNORMAL LOW (ref 6.5–8.1)

## 2023-07-16 LAB — CBC
HCT: 30.7 % — ABNORMAL LOW (ref 39.0–52.0)
Hemoglobin: 8.7 g/dL — ABNORMAL LOW (ref 13.0–17.0)
MCH: 28.6 pg (ref 26.0–34.0)
MCHC: 28.3 g/dL — ABNORMAL LOW (ref 30.0–36.0)
MCV: 101 fL — ABNORMAL HIGH (ref 80.0–100.0)
Platelets: 170 10*3/uL (ref 150–400)
RBC: 3.04 MIL/uL — ABNORMAL LOW (ref 4.22–5.81)
RDW: 14.6 % (ref 11.5–15.5)
WBC: 18 10*3/uL — ABNORMAL HIGH (ref 4.0–10.5)
nRBC: 0 % (ref 0.0–0.2)

## 2023-07-16 LAB — MAGNESIUM: Magnesium: 2.6 mg/dL — ABNORMAL HIGH (ref 1.7–2.4)

## 2023-07-16 MED ORDER — ACETAMINOPHEN 325 MG PO TABS: 650.0000 mg | ORAL_TABLET | Freq: Four times a day (QID) | ORAL | Status: DC | PRN

## 2023-07-16 MED ORDER — GLYCOPYRROLATE 0.2 MG/ML IJ SOLN
0.2000 mg | INTRAMUSCULAR | Status: DC | PRN
  Filled 2023-07-16: qty 1

## 2023-07-16 MED ORDER — POTASSIUM CHLORIDE CRYS ER 20 MEQ PO TBCR
40.0000 meq | EXTENDED_RELEASE_TABLET | Freq: Once | ORAL | Status: AC
  Administered 2023-07-16: 40 meq via ORAL
  Filled 2023-07-16: qty 2

## 2023-07-16 MED ORDER — MORPHINE SULFATE (CONCENTRATE) 10 MG/0.5ML PO SOLN
5.0000 mg | ORAL | Status: DC | PRN
  Administered 2023-07-17: 10 mg via SUBLINGUAL
  Filled 2023-07-16: qty 0.5

## 2023-07-16 MED ORDER — ONDANSETRON HCL 4 MG/2ML IJ SOLN: 4.0000 mg | Freq: Four times a day (QID) | INTRAMUSCULAR | Status: DC | PRN

## 2023-07-16 MED ORDER — POLYVINYL ALCOHOL 1.4 % OP SOLN: 1.0000 [drp] | Freq: Four times a day (QID) | OPHTHALMIC | Status: DC | PRN

## 2023-07-16 MED ORDER — BIOTENE DRY MOUTH MT LIQD: 15.0000 mL | OROMUCOSAL | Status: DC | PRN

## 2023-07-16 MED ORDER — GLYCOPYRROLATE 1 MG PO TABS: 1.0000 mg | ORAL_TABLET | ORAL | Status: DC | PRN

## 2023-07-16 MED ORDER — ACETAMINOPHEN 650 MG RE SUPP: 650.0000 mg | Freq: Four times a day (QID) | RECTAL | Status: DC | PRN

## 2023-07-16 MED ORDER — HALOPERIDOL LACTATE 2 MG/ML PO CONC: 0.5000 mg | ORAL | Status: DC | PRN

## 2023-07-16 MED ORDER — GLYCOPYRROLATE 0.2 MG/ML IJ SOLN
0.2000 mg | INTRAMUSCULAR | Status: DC | PRN
  Administered 2023-07-18: 0.2 mg via INTRAVENOUS

## 2023-07-16 MED ORDER — HALOPERIDOL LACTATE 5 MG/ML IJ SOLN: 0.5000 mg | INTRAMUSCULAR | Status: DC | PRN

## 2023-07-16 MED ORDER — ONDANSETRON 4 MG PO TBDP: 4.0000 mg | ORAL_TABLET | Freq: Four times a day (QID) | ORAL | Status: DC | PRN

## 2023-07-16 MED ORDER — LORAZEPAM 2 MG/ML IJ SOLN: 0.5000 mg | Freq: Four times a day (QID) | INTRAMUSCULAR | Status: DC | PRN

## 2023-07-16 MED ORDER — HALOPERIDOL 0.5 MG PO TABS: 0.5000 mg | ORAL_TABLET | ORAL | Status: DC | PRN

## 2023-07-16 NOTE — Progress Notes (Addendum)
PROGRESS NOTE   Jared Bridges  OZH:[id number] DOB: [birth year] DOA: 07/14/2023 PCP: Alden Benjamin, MD   Chief Complaint  Patient presents with   Failure To Thrive   Level of care: Telemetry  Brief Admission History:  89 y.o. 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   He has not eaten for the last 4 days and has stopped talking the last 2 days.  She has noticed a cough and also that his urine is darker brown. My history comes entirely from the emergency department provider as the daughter had just left the ER prior to my evaluation.  The patient met sepsis criteria with a temperature of 100.7, blood pressure 90/50, respiratory rate of 32 and tachycardia.  He does not wear oxygen normally but required 2.5 L nasal cannula to have O2 sats of 100%.  His workup in the emergency department revealed an elevated white blood cell count of 22.  His urine is still pending but his chest x-ray revealed only a chronic left lower lobe atelectasis.  Covid negative. He did receive sepsis level fluids and empiric vancomycin, cefepime, and Flagyl.  His vitals improved.  Blood pressure 134/62, pulse 100, temperature 98.1 F (36.7 C), temperature source Rectal, resp. rate (!) 31, weight 71.2 kg, SpO2 100%.    Assessment and Plan:  Sepsis from pseudomonas aeruginosa UTI  Febrile illness - procalcitonin only minimally elevated at 0.22 - follow cultures for blood and urine - DC vancomycin as MRSA screen negative  - continue cefepime for now - follow up urine culture: >100 k cfu pseudomonas aeruginosa - lactic acidosis resolved with hydration - repeat CXR in AM rules out pneumonia  - incentive spirometry encouraged  - ambulate with PT  - palliative care consult for goals of  care  - continue cefepime IV as noted to have sensitivity for pseudomonas based on UC  Severe protein calorie malnutrition / Adult Failure to thrive  - palliative care consultation requested   Prostate cancer - holding immunotherapy agents for now - palliative consultation requested - prognosis very guarded with high mortality risk  - can take home supply of enzalutamide (not avail in hospital)  Hypokalemia - oral replacement ordered and repleted - replenished Mg  - recheck in AM   Lactic acidosis - resolved with hydration   Conjunctivitis right eye - polytrim eye drops ordered with clinical improvement seen  Leukocytosis  - repeat CBC with diff in AM  - continue current treatments   DVT prophylaxis: SCDs Code Status: Full  Family Communication:  Disposition: TBD    Consultants:  Palliative care   Procedures:   Antimicrobials:  Cefepime 8/13>> Vancomycin 8/13   Subjective: No complaints, mostly nonverbal but more cooperative today,   Objective: Vitals:   07/16/23 0133 07/16/23 0528 07/16/23 0710 07/16/23 0852  BP: (!) 106/52 (!) 116/56  (!) 107/51  Pulse: 84 86  83  Resp: (!) 24 (!) 22    Temp: 97.8 F (36.6 C) (!) 97.4 F (36.3 C)    TempSrc:  Temporal    SpO2: 98% 100% 99% 100%  Weight:        Intake/Output Summary (Last 24 hours) at 07/16/2023 0925 Last data filed at 07/16/2023 0900 Gross per 24 hour  Intake 819 ml  Output 400 ml  Net 419 ml   Filed Weights   07/14/23 1315 07/15/23 0539  Weight: 71.2 kg 68 kg   Examination:  General exam: improved appearance of yellow crust and drainage from both eyes, R>L.  Appears calm and comfortable  Respiratory system: Clear to auscultation. Respiratory effort normal. Cardiovascular system: normal S1 & S2 heard. No JVD, murmurs, rubs, gallops or clicks. No pedal edema. Gastrointestinal system: Abdomen is nondistended, soft and nontender. No organomegaly or masses felt. Normal bowel sounds heard. Central  nervous system: Alert and oriented. No focal neurological deficits. Extremities: Symmetric 5 x 5 power. Skin: No rashes, lesions or ulcers. Psychiatry: Judgement and insight appear normal. Mood & affect appropriate.   Data Reviewed: I have personally reviewed following labs and imaging studies  CBC: Recent Labs  Lab 07/14/23 1234 07/15/23 0531 07/16/23 0420  WBC 22.0* 16.2* 18.0*  NEUTROABS 20.7*  --   --   HGB 11.1* 9.2* 8.7*  HCT 36.3* 30.4* 30.7*  MCV 94.5 96.5 101.0*  PLT 297 231 170    Basic Metabolic Panel: Recent Labs  Lab 07/14/23 1234 07/15/23 0531 07/15/23 0826 07/16/23 0420  NA 140 140  --  139  K 3.4* 2.9*  --  3.5  CL 101 108  --  108  CO2 23 23  --  24  GLUCOSE 141* 79  --  145*  BUN 34* 29*  --  23  CREATININE 0.66 0.47*  --  0.55*  CALCIUM 9.3 8.7*  --  8.6*  MG  --   --  1.8 2.6*    CBG: Recent Labs  Lab 07/14/23 1203  GLUCAP 136*    Recent Results (from the past 240 hour(s))  Culture, blood (Routine x 2)     Status: None (Preliminary result)   Collection Time: 07/14/23  1:15 PM   Specimen: BLOOD  Result Value Ref Range Status   Specimen Description BLOOD BLOOD RIGHT ARM back arm  Final   Special Requests   Final    BOTTLES DRAWN AEROBIC ONLY Blood Culture adequate volume   Culture   Final    NO GROWTH < 24 HOURS Performed at [facility], [address], [place], [place] [address]    Report Status PENDING  Incomplete  Culture, blood (Routine x 2)     Status: None (Preliminary result)   Collection Time: 07/14/23  1:57 PM   Specimen: BLOOD  Result Value Ref Range Status   Specimen Description BLOOD BLOOD RIGHT ARM  Final   Special Requests   Final    BOTTLES DRAWN AEROBIC AND ANAEROBIC Blood Culture adequate volume   Culture   Final    NO GROWTH < 24 HOURS Performed at [facility], [address] [place], [place] [address]    Report Status PENDING  Incomplete  Resp panel by RT-PCR (RSV, Flu A&B, Covid) Anterior Nasal Swab      Status: None   Collection Time: 07/14/23  2:22 PM   Specimen: Anterior Nasal Swab  Result Value Ref Range Status   SARS Coronavirus 2 by RT PCR NEGATIVE NEGATIVE Final    Comment: (NOTE) SARS-CoV-2 target nucleic acids are NOT DETECTED.  The SARS-CoV-2 RNA is generally detectable in upper respiratory specimens during the acute phase of infection. The lowest concentration of SARS-CoV-2 viral copies this assay can detect is 138 copies/mL. A negative result does not preclude SARS-Cov-2 infection and should not be used as the sole basis for treatment or other patient management decisions. A negative result  may occur with  improper specimen collection/handling, submission of specimen other than nasopharyngeal swab, presence of viral mutation(s) within the areas targeted by this assay, and inadequate number of viral copies(<138 copies/mL). A negative result must be combined with clinical observations, patient history, and epidemiological information. The expected result is Negative.  Fact Sheet for Patients:  BloggerCourse.com  Fact Sheet for Healthcare Providers:  SeriousBroker.it  This test is no t yet approved or cleared by the [place] FDA and  has been authorized for detection and/or diagnosis of SARS-CoV-2 by FDA under an Emergency Use Authorization (EUA). This EUA will remain  in effect (meaning this test can be used) for the duration of the COVID-19 declaration under Section 564(b)(1) of the Act, 21 U.S.C.section 360bbb-3(b)(1), unless the authorization is terminated  or revoked sooner.       Influenza A by PCR NEGATIVE NEGATIVE Final   Influenza B by PCR NEGATIVE NEGATIVE Final    Comment: (NOTE) The Xpert Xpress SARS-CoV-2/FLU/RSV plus assay is intended as an aid in the diagnosis of influenza from Nasopharyngeal swab specimens and should not be used as a sole basis for treatment. Nasal washings and aspirates are  unacceptable for Xpert Xpress SARS-CoV-2/FLU/RSV testing.  Fact Sheet for Patients: BloggerCourse.com  Fact Sheet for Healthcare Providers: SeriousBroker.it  This test is not yet approved or cleared by the [place] FDA and has been authorized for detection and/or diagnosis of SARS-CoV-2 by FDA under an Emergency Use Authorization (EUA). This EUA will remain in effect (meaning this test can be used) for the duration of the COVID-19 declaration under Section 564(b)(1) of the Act, 21 U.S.C. section 360bbb-3(b)(1), unless the authorization is terminated or revoked.     Resp Syncytial Virus by PCR NEGATIVE NEGATIVE Final    Comment: (NOTE) Fact Sheet for Patients: BloggerCourse.com  Fact Sheet for Healthcare Providers: SeriousBroker.it  This test is not yet approved or cleared by the [place] FDA and has been authorized for detection and/or diagnosis of SARS-CoV-2 by FDA under an Emergency Use Authorization (EUA). This EUA will remain in effect (meaning this test can be used) for the duration of the COVID-19 declaration under Section 564(b)(1) of the Act, 21 U.S.C. section 360bbb-3(b)(1), unless the authorization is terminated or revoked.  Performed at [facility], [address] [place], [place] [address]   Urine Culture     Status: Abnormal   Collection Time: 07/14/23  4:00 PM   Specimen: Urine, Random  Result Value Ref Range Status   Specimen Description   Final    URINE, RANDOM Performed at [facility], [address], [place], [place] [address]    Special Requests   Final    NONE Reflexed from [id number] Performed at [facility], [address] [place], [place] [address]    Culture >=100,000 COLONIES/mL PSEUDOMONAS AERUGINOSA (A)  Final   Report Status 07/16/2023 FINAL  Final   Organism ID, Bacteria PSEUDOMONAS AERUGINOSA (A)  Final      Susceptibility    Pseudomonas aeruginosa - MIC*    CEFTAZIDIME <=1 SENSITIVE Sensitive     CIPROFLOXACIN <=0.25 SENSITIVE Sensitive     GENTAMICIN <=1 SENSITIVE Sensitive     IMIPENEM 1 SENSITIVE Sensitive     PIP/TAZO 8 SENSITIVE Sensitive     CEFEPIME 2 SENSITIVE Sensitive     * >=100,000 COLONIES/mL PSEUDOMONAS AERUGINOSA  MRSA Next Gen by PCR, Nasal     Status: None   Collection Time: 07/15/23  8:45 AM   Specimen: Nasal Mucosa;  Nasal Swab  Result Value Ref Range Status   MRSA by PCR Next Gen NOT DETECTED NOT DETECTED Final    Comment: (NOTE) The GeneXpert MRSA Assay (FDA approved for NASAL specimens only), is one component of a comprehensive MRSA colonization surveillance program. It is not intended to diagnose MRSA infection nor to guide or monitor treatment for MRSA infections. Test performance is not FDA approved in patients less than 7 years old. Performed at [facility], [address] [place], [place] [address]      Radiology Studies: DG CHEST PORT 1 VIEW  Result Date: 07/15/2023 CLINICAL DATA:  [id number] Dyspnea [id number] Sepsis, failure to thrive, dyspnea. EXAM: PORTABLE CHEST 1 VIEW COMPARISON:  Chest x-ray 07/14/2023, CT chest 12/31/2022, CT abdomen pelvis 04/27/2022, chest x-ray 03/25/2023 FINDINGS: The heart and mediastinal contours are unchanged. Aortic calcification. Elevated left hemidiaphragm. Interval increase in left lower lobe and lingular airspace opacity. Interval increase in size of an at least small left pleural effusion. No right pleural effusion. Chronic coarsened markings with no overt pulmonary edema. No pleural effusion. No pneumothorax. No acute osseous abnormality. Chronic 4 mm retained metallic density overlying the left upper abdomen. IMPRESSION: 1. Interval increase in left lower lobe and lingular airspace opacity. Interval increase in size of an at least small left pleural effusion. 2. Elevated left hemidiaphragm. 3.  Aortic Atherosclerosis (ICD10-I70.0). Electronically  Signed   By: Tish Frederickson M.D.   On: 07/15/2023 20:59   CT Head Wo Contrast  Result Date: 07/14/2023 CLINICAL DATA:  Altered mental status EXAM: CT HEAD WITHOUT CONTRAST TECHNIQUE: Contiguous axial images were obtained from the base of the skull through the vertex without intravenous contrast. RADIATION DOSE REDUCTION: This exam was performed according to the departmental dose-optimization program which includes automated exposure control, adjustment of the mA and/or kV according to patient size and/or use of iterative reconstruction technique. COMPARISON:  06/23/2023 FINDINGS: Brain: There is patient motion in some of the images. Additional images were repeated. There are no signs of bleeding within the cranium. There is decreased density in periventricular and subcortical white matter. Cortical sulci are prominent. Vascular: Unremarkable. Skull: No fracture is seen in calvarium. Sinuses/Orbits: There is mild mucosal thickening in the right sphenoid sinus. Other: No significant interval changes are noted. IMPRESSION: No acute intracranial findings are seen. Atrophy. Small-vessel disease. Mild chronic sphenoid sinusitis. Electronically Signed   By: Ernie Avena M.D.   On: 07/14/2023 15:48   DG Chest Port 1 View  Result Date: 07/14/2023 CLINICAL DATA:  Sepsis. Difficulty breathing with decreased oral intake for 4 days. EXAM: PORTABLE CHEST 1 VIEW COMPARISON:  Radiographs 06/23/2023 and 06/01/2023.  CT 12/31/2022. FINDINGS: 1327 hours. Two views obtained. The heart size and mediastinal contours are stable with aortic atherosclerosis. There is chronic left lower lobe airspace disease with a small left pleural effusion, progressive from the most recent study. The right lung appears clear. No evidence of pneumothorax. No acute osseous findings are seen. There are mild degenerative changes in the spine. Surgical clips are noted within the upper abdomen. IMPRESSION: Progressive chronic left lower lobe  airspace disease with associated small left pleural effusion on prior CT. Findings may be secondary to chronic aspiration. Correlate clinically. Atypical malignancy considered less likely. Electronically Signed   By: Carey Bullocks M.D.   On: 07/14/2023 15:06    Scheduled Meds:  alfuzosin  10 mg Oral Q breakfast   vitamin C  500 mg Oral Daily   atorvastatin  40 mg Oral Daily  Chlorhexidine Gluconate Cloth  6 each Topical Q0600   docusate sodium  100 mg Oral BID   enzalutamide  80 mg Oral Daily   finasteride  5 mg Oral Daily   ipratropium-albuterol  3 mL Nebulization QID   mirtazapine  15 mg Oral QHS   pantoprazole  40 mg Oral Daily   pregabalin  75 mg Oral BID   sodium chloride HYPERTONIC  4 mL Nebulization Daily   topiramate  50 mg Oral BID   trimethoprim-polymyxin b  1 drop Both Eyes Q6H   Continuous Infusions:  ceFEPime (MAXIPIME) IV 2 g (07/16/23 0551)   lactated ringers 10 mL/hr at 07/16/23 0800     LOS: 2 days   Time spent: 39 mins   Laural Benes, MD How to contact the [facility] Attending or Consulting provider 7A - 7P or covering provider during after hours 7P -7A, for this patient?  Check the care team in [facility] and look for a) attending/consulting TRH provider listed and b) the [facility] team listed Log into [email or web address] and use [facility]'s universal password to access. If you do not have the password, please contact the hospital operator. Locate the [facility] provider you are looking for under [facility] and page to a number that you can be directly reached. If you still have difficulty reaching the provider, please page the [facility] (Director on Call) for the Hospitalists listed on amion for assistance.  07/16/2023, 9:25 AM

## 2023-07-16 NOTE — Plan of Care (Signed)
  Problem: Education: Goal: Knowledge of General Education information will improve Description: Including pain rating scale, medication(s)/side effects and non-pharmacologic comfort measures Outcome: Not Progressing   Problem: Health Behavior/Discharge Planning: Goal: Ability to manage health-related needs will improve Outcome: Not Progressing   

## 2023-07-16 NOTE — Progress Notes (Signed)
Palliative:    Jared Bridges is lying quietly in bed.  He appears acutely/chronically ill and very frail.  His eyes are open, and he will briefly make eye contact.  He is able to whisper his name when asked, but does not attempt to answer other orientation questions.  I do not believe that he can make his needs known.  There is no family at bedside at this time.  Secure chat with team stating that family is considering hospice care.  I return later in the day to find daughter/HCPOA, Jared Bridges, present.  Jared Bridges shares that they have realized that Mr. Geddes is ready for hospice care.  She has been working with the [place] who is going to send [facility] hospice because of extra help provided.  She is now requesting inpatient/residential hospice at a new facility in [place].  TOC is working for placement.  Comfort care, end of order set implemented.  DNR completed and placed on chart.  Conference with attending, bedside nursing staff, transition of care team related to patient condition, needs, goals of care, disposition.  Plan:   Comfort and dignity at end-of-life, requesting residential hospice with the [place] facility in [place].  End-of-life order set in place.  DNR/goldenrod form completed and placed on chart. Prognosis:    Less than 2 weeks anticipated based on chronic illness burden, frailty, family desire to focus on comfort and dignity, let nature take its course.  50 minutes  Lillia Carmel, NP Palliative medicine team Team phone [phone] Greater than 50% of this time was spent counseling and coordinating care related to the above assessment and plan.

## 2023-07-16 NOTE — Progress Notes (Signed)
Patient alert and awake this morning, talking with the staff, patient BP improved overnight, sacral wound dressing changed. Patient continued on IV ABX. Skin tear found on right leg, foam dressing applied, patient resting in bed at this time, call bell placed within reach.

## 2023-07-16 NOTE — TOC Progression Note (Signed)
Transition of Care [facility]) - Progression Note    Patient Details  Name: Jared Bridges MRN: [id number] Date of Birth: [birth year]  [facility]) CM/SW Contact  [name], LCSW Phone Number: 07/16/2023, 11:28 AM  Clinical Narrative:    Daughter, [name], [facility] hospice. She states that they wont do rehab. Patient is active with [employer] services (RN, aide).  [name] with [facility] advises that they received referral for patient from the [place] on the day that patient was brought to [facility]. Message left for Milbert Coulter, [facility] contact for [facility]. Daughter now feels patient will be best suited at at the [facility] medical center palliative unit.  Message left for [name] [phone] [facility].    Expected Discharge Plan: Home w Home Health Services Barriers to Discharge: Continued Medical Work up  Expected Discharge Plan and Services       Living arrangements for the past 2 months: Single Family Home                                       Social Determinants of Health (SDOH) Interventions SDOH Screenings   Food Insecurity: Patient Unable To Answer (07/14/2023)  Housing: High Risk (07/14/2023)  Transportation Needs: Patient Unable To Answer (07/14/2023)  Utilities: Patient Unable To Answer (07/14/2023)  Depression (PHQ2-9): Low Risk  (01/03/2021)  Tobacco Use: Medium Risk (07/15/2023)    Readmission Risk Interventions    06/03/2023    8:15 AM 03/26/2023   10:59 AM 01/02/2023   12:22 PM  Readmission Risk Prevention Plan  Transportation Screening Complete Complete Complete  Medication Review Oceanographer) Complete Complete Complete  HRI or Home Care Consult Complete Complete Complete  SW Recovery Care/Counseling Consult Complete  Complete  Palliative Care Screening Not Applicable Not Applicable Not Applicable  Skilled Nursing Facility Not Applicable Not Applicable Not Applicable

## 2023-07-16 NOTE — Plan of Care (Signed)
  Problem: Education: Goal: Knowledge of General Education information will improve Description: Including pain rating scale, medication(s)/side effects and non-pharmacologic comfort measures Outcome: Not Progressing   Problem: Health Behavior/Discharge Planning: Goal: Ability to manage health-related needs will improve Outcome: Not Progressing   Problem: Coping: Goal: Level of anxiety will decrease Outcome: Not Progressing   Problem: Elimination: Goal: Will not experience complications related to bowel motility Outcome: Not Progressing Goal: Will not experience complications related to urinary retention Outcome: Not Progressing

## 2023-07-17 DIAGNOSIS — A419 Sepsis, unspecified organism: Secondary | ICD-10-CM | POA: Diagnosis not present

## 2023-07-17 NOTE — Progress Notes (Signed)
   07/17/23 1604  Spiritual Encounters  Type of Visit Initial  Care provided to: Patient  Conversation partners present during encounter Nurse  Referral source Chaplain assessment  Reason for visit End-of-life  OnCall Visit No   Chaplain went to visit Pt as part of a spiritual routine visit. Pt was alone in his room, and couldn't speak. Pt has difficulty to talk, but agreed visually to Chaplain's presence. Chaplain accompanied quietly Pt for some minutes in silence. After that time, Pt couldn't talk, but expressed his gratitude for Chaplin's visit.

## 2023-07-17 NOTE — TOC Progression Note (Signed)
Transition of Care [facility]) - Progression Note    Patient Details  Name: SINCEAR TANTON MRN: [id number] Date of Birth: June 10, 1930  Transition of Care [facility]) CM/SW Contact  [name], LCSW Phone Number: 07/17/2023, 6:15 PM  Clinical Narrative:    Spoke with Cathi Roan  [facility] and emailed referral documents.   Expected Discharge Plan: Home w Home Health Services Barriers to Discharge: Continued Medical Work up  Expected Discharge Plan and Services       Living arrangements for the past 2 months: Single Family Home                                       Social Determinants of Health (SDOH) Interventions SDOH Screenings   Food Insecurity: Patient Unable To Answer (07/14/2023)  Housing: High Risk (07/14/2023)  Transportation Needs: Patient Unable To Answer (07/14/2023)  Utilities: Patient Unable To Answer (07/14/2023)  Depression (PHQ2-9): Low Risk  (01/03/2021)  Tobacco Use: Medium Risk (07/15/2023)    Readmission Risk Interventions    06/03/2023    8:15 AM 03/26/2023   10:59 AM 01/02/2023   12:22 PM  Readmission Risk Prevention Plan  Transportation Screening Complete Complete Complete  Medication Review Oceanographer) Complete Complete Complete  HRI or Home Care Consult Complete Complete Complete  SW Recovery Care/Counseling Consult Complete  Complete  Palliative Care Screening Not Applicable Not Applicable Not Applicable  Skilled Nursing Facility Not Applicable Not Applicable Not Applicable

## 2023-07-17 NOTE — Progress Notes (Signed)
Pt gestured appropriately during the night. Pt shook head when asked if he was in any pain and pt did not exhibit any signs of pain or distress during the night. PAINAD score 0.

## 2023-07-17 NOTE — Plan of Care (Signed)
  Problem: Education: Goal: Knowledge of General Education information will improve Description Including pain rating scale, medication(s)/side effects and non-pharmacologic comfort measures Outcome: Progressing   Problem: Health Behavior/Discharge Planning: Goal: Ability to manage health-related needs will improve Outcome: Progressing   

## 2023-07-17 NOTE — Progress Notes (Signed)
PROGRESS NOTE   DECLIN KRAS  LKG:[id number] DOB: [birth year] DOA: 07/14/2023 PCP: Alden Benjamin, MD   Chief Complaint  Patient presents with   Failure To Thrive   Level of care: Telemetry  Brief Admission History:  89 y.o. 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   He has not eaten for the last 4 days and has stopped talking the last 2 days.  She has noticed a cough and also that his urine is darker brown. My history comes entirely from the emergency department provider as the daughter had just left the ER prior to my evaluation.  The patient met sepsis criteria with a temperature of 100.7, blood pressure 90/50, respiratory rate of 32 and tachycardia.  He does not wear oxygen normally but required 2.5 L nasal cannula to have O2 sats of 100%.  His workup in the emergency department revealed an elevated white blood cell count of 22.  His urine is still pending but his chest x-ray revealed only a chronic left lower lobe atelectasis.  Covid negative. He did receive sepsis level fluids and empiric vancomycin, cefepime, and Flagyl.  His vitals improved.  Blood pressure 134/62, pulse 100, temperature 98.1 F (36.7 C), temperature source Rectal, resp. rate (!) 31, weight 71.2 kg, SpO2 100%.  8/16: Patient was found to have sepsis secondary to Pseudomonas aeruginosa UTI, received antibiotic appropriately but continued to deteriorate. Palliative care was consulted and patient was transitioned to comfort care. Currently awaiting [facility] hosp[facility] availability.    Assessment and Plan:  Sepsis from pseudomonas aeruginosa UTI  Patient was initially received broad-spectrum antibiotics followed by cefepime based on sensitivity results. Later family withdraw  the active management and transition him to comfort care. -Comfort care measures initiated -Awaiting hospice facility placement  Severe protein calorie malnutrition / Adult Failure to thrive  - palliative care consultation requested  -Patient has been transitioned to comfort care now  Prostate cancer - holding immunotherapy agents for now - palliative consultation requested - prognosis very guarded with high mortality risk  -Patient is comfort care only now  Hypokalemia Resolved  Lactic acidosis - resolved with hydration   Conjunctivitis right eye - polytrim eye drops ordered with clinical improvement seen  DVT prophylaxis: SCDs Code Status: Full  Family Communication:  Disposition: TBD    Consultants:  Palliative care   Procedures:   Antimicrobials:  Cefepime 8/13>> Vancomycin 8/13   Subjective: Patient open eyes momentarily when called his name, appears comfortable, not following any commands.  Objective: Vitals:   07/16/23 1115 07/16/23 1455 07/16/23 2048 07/17/23 1434  BP:   (!) 98/54 (!) 124/58  Pulse:   72 82  Resp:   15 (!) 22  Temp:   98.1 F (36.7 C) 97.8 F (36.6 C)  TempSrc:   Oral Oral  SpO2: 100% 98% 100% 100%  Weight:        Intake/Output Summary (Last 24 hours) at 07/17/2023 1656 Last data filed at 07/17/2023 0725 Gross per 24 hour  Intake 360 ml  Output 300 ml  Net 60 ml   Filed Weights   07/14/23 1315 07/15/23 0539  Weight: 71.2 kg 68 kg   Examination:  General.  Frail and malnourished elderly man, in no acute distress. Pulmonary.  Lungs clear bilaterally, normal respiratory  effort. CV.  Regular rate and rhythm, no JVD, rub or murmur. Abdomen.  Soft, nontender, nondistended, BS positive. CNS.  Lethargic, not following any commands Extremities.  No edema, no cyanosis, pulses intact and symmetrical.  Data Reviewed: I have personally reviewed following labs and imaging studies  CBC: Recent Labs  Lab 07/14/23 1234  07/15/23 0531 07/16/23 0420  WBC 22.0* 16.2* 18.0*  NEUTROABS 20.7*  --   --   HGB 11.1* 9.2* 8.7*  HCT 36.3* 30.4* 30.7*  MCV 94.5 96.5 101.0*  PLT 297 231 170    Basic Metabolic Panel: Recent Labs  Lab 07/14/23 1234 07/15/23 0531 07/15/23 0826 07/16/23 0420  NA 140 140  --  139  K 3.4* 2.9*  --  3.5  CL 101 108  --  108  CO2 23 23  --  24  GLUCOSE 141* 79  --  145*  BUN 34* 29*  --  23  CREATININE 0.66 0.47*  --  0.55*  CALCIUM 9.3 8.7*  --  8.6*  MG  --   --  1.8 2.6*    CBG: Recent Labs  Lab 07/14/23 1203  GLUCAP 136*    Recent Results (from the past 240 hour(s))  Culture, blood (Routine x 2)     Status: None (Preliminary result)   Collection Time: 07/14/23  1:15 PM   Specimen: BLOOD  Result Value Ref Range Status   Specimen Description BLOOD BLOOD RIGHT ARM back arm  Final   Special Requests   Final    BOTTLES DRAWN AEROBIC ONLY Blood Culture adequate volume   Culture   Final    NO GROWTH 3 DAYS Performed at [facility], [address] [place], [place] [address]    Report Status PENDING  Incomplete  Culture, blood (Routine x 2)     Status: None (Preliminary result)   Collection Time: 07/14/23  1:57 PM   Specimen: BLOOD  Result Value Ref Range Status   Specimen Description BLOOD BLOOD RIGHT ARM  Final   Special Requests   Final    BOTTLES DRAWN AEROBIC AND ANAEROBIC Blood Culture adequate volume   Culture   Final    NO GROWTH 3 DAYS Performed at [facility], [address], [place], [place] [address]    Report Status PENDING  Incomplete  Resp panel by RT-PCR (RSV, Flu A&B, Covid) Anterior Nasal Swab     Status: None   Collection Time: 07/14/23  2:22 PM   Specimen: Anterior Nasal Swab  Result Value Ref Range Status   SARS Coronavirus 2 by RT PCR NEGATIVE NEGATIVE Final    Comment: (NOTE) SARS-CoV-2 target nucleic acids are NOT DETECTED.  The SARS-CoV-2 RNA is generally detectable in upper respiratory specimens during the acute phase of  infection. The lowest concentration of SARS-CoV-2 viral copies this assay can detect is 138 copies/mL. A negative result does not preclude SARS-Cov-2 infection and should not be used as the sole basis for treatment or other patient management decisions. A negative result may occur with  improper specimen collection/handling, submission of specimen other than nasopharyngeal swab, presence of viral mutation(s) within the areas targeted by this assay, and inadequate number of viral copies(<138 copies/mL). A negative result must be combined with clinical observations, patient history, and epidemiological information. The expected result is Negative.  Fact Sheet for Patients:  BloggerCourse.com  Fact Sheet for Healthcare Providers:  SeriousBroker.it  This test is no t yet approved or cleared by the [place] and  has been authorized for detection and/or diagnosis of SARS-CoV-2 by FDA under an Emergency Use Authorization (EUA). This EUA will remain  in effect (meaning this test can be used) for the duration of the COVID-19 declaration under Section 564(b)(1) of the Act, 21 U.S.C.section 360bbb-3(b)(1), unless the authorization is terminated  or revoked sooner.       Influenza A by PCR NEGATIVE NEGATIVE Final   Influenza B by PCR NEGATIVE NEGATIVE Final    Comment: (NOTE) The Xpert Xpress SARS-CoV-2/FLU/RSV plus assay is intended as an aid in the diagnosis of influenza from Nasopharyngeal swab specimens and should not be used as a sole basis for treatment. Nasal washings and aspirates are unacceptable for Xpert Xpress SARS-CoV-2/FLU/RSV testing.  Fact Sheet for Patients: BloggerCourse.com  Fact Sheet for Healthcare Providers: SeriousBroker.it  This test is not yet approved or cleared by the [place] FDA and has been authorized for detection and/or diagnosis of SARS-CoV-2  by FDA under an Emergency Use Authorization (EUA). This EUA will remain in effect (meaning this test can be used) for the duration of the COVID-19 declaration under Section 564(b)(1) of the Act, 21 U.S.C. section 360bbb-3(b)(1), unless the authorization is terminated or revoked.     Resp Syncytial Virus by PCR NEGATIVE NEGATIVE Final    Comment: (NOTE) Fact Sheet for Patients: BloggerCourse.com  Fact Sheet for Healthcare Providers: SeriousBroker.it  This test is not yet approved or cleared by the [place] FDA and has been authorized for detection and/or diagnosis of SARS-CoV-2 by FDA under an Emergency Use Authorization (EUA). This EUA will remain in effect (meaning this test can be used) for the duration of the COVID-19 declaration under Section 564(b)(1) of the Act, 21 U.S.C. section 360bbb-3(b)(1), unless the authorization is terminated or revoked.  Performed at [facility], [address], [place], [place] [address]   Urine Culture     Status: Abnormal   Collection Time: 07/14/23  4:00 PM   Specimen: Urine, Random  Result Value Ref Range Status   Specimen Description   Final    URINE, RANDOM Performed at [facility], [address], [place], [place] [address]    Special Requests   Final    NONE Reflexed from [id number] Performed at [facility], [address], [place], [place] [address]    Culture >=100,000 COLONIES/mL PSEUDOMONAS AERUGINOSA (A)  Final   Report Status 07/16/2023 FINAL  Final   Organism ID, Bacteria PSEUDOMONAS AERUGINOSA (A)  Final      Susceptibility   Pseudomonas aeruginosa - MIC*    CEFTAZIDIME <=1 SENSITIVE Sensitive     CIPROFLOXACIN <=0.25 SENSITIVE Sensitive     GENTAMICIN <=1 SENSITIVE Sensitive     IMIPENEM 1 SENSITIVE Sensitive     PIP/TAZO 8 SENSITIVE Sensitive     CEFEPIME 2 SENSITIVE Sensitive     * >=100,000 COLONIES/mL PSEUDOMONAS AERUGINOSA  MRSA Next Gen by PCR, Nasal     Status:  None   Collection Time: 07/15/23  8:45 AM   Specimen: Nasal Mucosa; Nasal Swab  Result Value Ref Range Status   MRSA by PCR Next Gen NOT DETECTED NOT DETECTED Final    Comment: (NOTE) The GeneXpert MRSA Assay (FDA approved for NASAL specimens only), is one component of a comprehensive MRSA colonization surveillance program. It is not intended to diagnose MRSA infection nor to guide or monitor treatment for MRSA infections. Test performance is not FDA approved in patients less than 36 years old. Performed at [facility], [address],  [place], [place] [address]      Radiology Studies: DG CHEST PORT 1 VIEW  Result Date: 07/16/2023 CLINICAL DATA:  [id number] SIRS (systemic inflammatory response syndrome) ([name] [id number] Fever, unknown origin [id number] EXAM: PORTABLE CHEST 1 VIEW COMPARISON:  07/15/2023 FINDINGS: Stable cardiomediastinal contours. Aortic atherosclerosis. Small left pleural effusion with left basilar opacity, slightly improved. Mild right basilar atelectasis. No pneumothorax. IMPRESSION: Small left pleural effusion with left basilar opacity, slightly improved. Electronically Signed   By: Duanne Guess D.O.   On: 07/16/2023 10:37   DG CHEST PORT 1 VIEW  Result Date: 07/15/2023 CLINICAL DATA:  [id number] Dyspnea [id number] Sepsis, failure to thrive, dyspnea. EXAM: PORTABLE CHEST 1 VIEW COMPARISON:  Chest x-ray 07/14/2023, CT chest 12/31/2022, CT abdomen pelvis 04/27/2022, chest x-ray 03/25/2023 FINDINGS: The heart and mediastinal contours are unchanged. Aortic calcification. Elevated left hemidiaphragm. Interval increase in left lower lobe and lingular airspace opacity. Interval increase in size of an at least small left pleural effusion. No right pleural effusion. Chronic coarsened markings with no overt pulmonary edema. No pleural effusion. No pneumothorax. No acute osseous abnormality. Chronic 4 mm retained metallic density overlying the left upper abdomen. IMPRESSION: 1. Interval increase  in left lower lobe and lingular airspace opacity. Interval increase in size of an at least small left pleural effusion. 2. Elevated left hemidiaphragm. 3.  Aortic Atherosclerosis (ICD10-I70.0). Electronically Signed   By: Tish Frederickson M.D.   On: 07/15/2023 20:59    Scheduled Meds:  trimethoprim-polymyxin b  1 drop Both Eyes Q6H   Continuous Infusions:    LOS: 3 days   Time spent: 40 mins  Arnetha Courser, MD How to contact the [facility] Attending or Consulting provider 7A - 7P or covering provider during after hours 7P -7A, for this patient?  Check the care team in [facility] and look for a) attending/consulting TRH provider listed and b) the [facility] team listed Log into [email or web address] and use [place]'s universal password to access. If you do not have the password, please contact the hospital operator. Locate the [facility] provider you are looking for under [facility] and page to a number that you can be directly reached. If you still have difficulty reaching the provider, please page the [facility] (Director on Call) for the Hospitalists listed on amion for assistance.  07/17/2023, 4:56 PM

## 2023-07-17 NOTE — TOC Progression Note (Signed)
Transition of Care [facility]) - Progression Note    Patient Details  Name: Jared Bridges MRN: [id number] Date of Birth: [birth year]  [facility]) CM/SW Contact  [name], LCSW Phone Number: 07/17/2023, 12:17 PM  Clinical Narrative:    Second Message left for [name] [phone] regarding [facility]. Discussed with daughter, [name], difficulty getting in contact with the [place]. [name] contacted [name] with [facility] and [name] is to have someone at the [facility] contact TOC.    Expected Discharge Plan: Home w Home Health Services Barriers to Discharge: Continued Medical Work up  Expected Discharge Plan and Services       Living arrangements for the past 2 months: Single Family Home                                       Social Determinants of Health (SDOH) Interventions SDOH Screenings   Food Insecurity: Patient Unable To Answer (07/14/2023)  Housing: High Risk (07/14/2023)  Transportation Needs: Patient Unable To Answer (07/14/2023)  Utilities: Patient Unable To Answer (07/14/2023)  Depression (PHQ2-9): Low Risk  (01/03/2021)  Tobacco Use: Medium Risk (07/15/2023)    Readmission Risk Interventions    06/03/2023    8:15 AM 03/26/2023   10:59 AM 01/02/2023   12:22 PM  Readmission Risk Prevention Plan  Transportation Screening Complete Complete Complete  Medication Review Oceanographer) Complete Complete Complete  HRI or Home Care Consult Complete Complete Complete  SW Recovery Care/Counseling Consult Complete  Complete  Palliative Care Screening Not Applicable Not Applicable Not Applicable  Skilled Nursing Facility Not Applicable Not Applicable Not Applicable

## 2023-07-17 NOTE — TOC Progression Note (Signed)
Transition of Care [facility]) - Progression Note    Patient Details  Name: Jared Bridges MRN: [id number] Date of Birth: [birth year]  Transition of Care [facility]) CM/SW Contact  [name], LCSW Phone Number: 07/17/2023, 4:24 PM  Clinical Narrative:    Referred to [facility], nurse will assess for appropriateness Saturday.   Expected Discharge Plan: Home w Home Health Services Barriers to Discharge: Continued Medical Work up  Expected Discharge Plan and Services       Living arrangements for the past 2 months: Single Family Home                                       Social Determinants of Health (SDOH) Interventions SDOH Screenings   Food Insecurity: Patient Unable To Answer (07/14/2023)  Housing: High Risk (07/14/2023)  Transportation Needs: Patient Unable To Answer (07/14/2023)  Utilities: Patient Unable To Answer (07/14/2023)  Depression (PHQ2-9): Low Risk  (01/03/2021)  Tobacco Use: Medium Risk (07/15/2023)    Readmission Risk Interventions    06/03/2023    8:15 AM 03/26/2023   10:59 AM 01/02/2023   12:22 PM  Readmission Risk Prevention Plan  Transportation Screening Complete Complete Complete  Medication Review Oceanographer) Complete Complete Complete  HRI or Home Care Consult Complete Complete Complete  SW Recovery Care/Counseling Consult Complete  Complete  Palliative Care Screening Not Applicable Not Applicable Not Applicable  Skilled Nursing Facility Not Applicable Not Applicable Not Applicable

## 2023-07-18 DIAGNOSIS — A419 Sepsis, unspecified organism: Secondary | ICD-10-CM | POA: Diagnosis not present

## 2023-07-18 NOTE — Progress Notes (Signed)
PROGRESS NOTE    JYDEN BABULA   ZOX:[id number] DOB: [birth year]  DOA: 07/14/2023 Date of Service: 07/18/23 PCP: Alden Benjamin, MD     Brief Narrative / Hospital Course:  89 y.o. 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  Had not eaten for the last 4 days and has stopped talking the last 2 days.  She has noticed a cough and also that his urine is darker brown. 08/13: On admission, patient met sepsis criteria with a temperature of 100.7, blood pressure 90/50, respiratory rate of 32 and tachycardia.  He does not wear oxygen normally but required 2.5 L nasal cannula to have O2 sats of 100%.  His workup in the emergency department revealed an elevated white blood cell count of 22.  Chest x-ray revealed only a chronic left lower lobe atelectasis.  Covid negative. He did receive sepsis level fluids and empiric vancomycin, cefepime, and Flagyl.   08/14-08/16: Patient was found to have sepsis secondary to Pseudomonas aeruginosa UTI, received antibiotic appropriately but continued to deteriorate. Palliative care was consulted and patient was transitioned to comfort care.  08/17: [facility].    Consultants:  Palliative Care   Procedures: none      ASSESSMENT & PLAN:   Principal Problem:   Sepsis ([name]   Sepsis from pseudomonas aeruginosa UTI  Patient was initially received broad-spectrum antibiotics followed by cefepime based on sensitivity results. Later family withdraw the active management and transition him to comfort care. Awaiting hospice facility placement   Severe protein calorie malnutrition / Adult Failure to thrive  Patient has been transitioned to comfort care now   Prostate cancer prognosis very  guarded with high mortality risk  Patient is comfort care only now   Hypokalemia Lactic acidosis Resolved No labs on comfort measures    Conjunctivitis right eye polytrim eye drops ordered with clinical improvement seen      DVT prophylaxis: n/a on comfort measures Pertinent IV fluids/nutrition: limit IV fluids, diet as able/desired  Central lines / invasive devices: Foley cath for comfort    Code Status: DNR ACP documentation reviewed: 07/18/23 no active forms on [facility]   Current Admission Status: inpatient  TOC needs / [facility] Barriers to discharge / significant pending items: placement              Subjective / Brief ROS:  Patient unable to contribute   Family Communication: none at this time     Objective Findings:  Vitals:   07/16/23 2048 07/17/23 1434 07/18/23 0421 07/18/23 1404  BP: (!) 98/54 (!) 124/58 (!) 133/58 (!) 114/50  Pulse: 72 82 80 70  Resp: 15 (!) 22 20 20   Temp: 98.1 F (36.7 C) 97.8 F (36.6 C) 99 F (37.2 C) 98.2 F (36.8 C)  TempSrc: Oral Oral  Axillary  SpO2: 100% 100% 100% 100%  Weight:        Intake/Output Summary (Last 24 hours) at 07/18/2023 1526 Last data filed at 07/18/2023 1300 Gross per 24 hour  Intake --  Output 400 ml  Net -400 ml   Filed Weights   07/14/23 1315 07/15/23 0539  Weight: 71.2 kg 68 kg    Examination:  Physical Exam Constitutional:      General: He is not in  acute distress.    Appearance: He is ill-appearing.  Cardiovascular:     Rate and Rhythm: Normal rate. Rhythm irregular.     Heart sounds: Murmur heard.  Pulmonary:     Effort: Pulmonary effort is normal. No respiratory distress.  Abdominal:     Palpations: Abdomen is soft.  Musculoskeletal:     Right lower leg: No edema.     Left lower leg: No edema.  Neurological:     Mental Status: He is disoriented.  Psychiatric:        Behavior: Behavior normal.          Scheduled Medications:   trimethoprim-polymyxin  b  1 drop Both Eyes Q6H    Continuous Infusions:   PRN Medications:  acetaminophen **OR** acetaminophen, albuterol, antiseptic oral rinse, glycopyrrolate **OR** glycopyrrolate **OR** glycopyrrolate, haloperidol **OR** haloperidol **OR** haloperidol lactate, LORazepam, morphine CONCENTRATE, ondansetron **OR** ondansetron (ZOFRAN) IV, polyvinyl alcohol  Antimicrobials from admission:  Anti-infectives (From admission, onward)    Start     Dose/Rate Route Frequency Ordered Stop   07/15/23 1800  ceFEPIme (MAXIPIME) 2 g in sodium chloride 0.9 % 100 mL IVPB  Status:  Discontinued        2 g 200 mL/hr over 30 Minutes Intravenous Every 12 hours 07/15/23 0859 07/16/23 1454   07/15/23 1600  vancomycin (VANCOREADY) IVPB 1500 mg/300 mL  Status:  Discontinued        1,500 mg 150 mL/hr over 120 Minutes Intravenous Every 24 hours 07/14/23 1431 07/15/23 1350   07/14/23 2200  ceFEPIme (MAXIPIME) 2 g in sodium chloride 0.9 % 100 mL IVPB  Status:  Discontinued        2 g 200 mL/hr over 30 Minutes Intravenous Every 8 hours 07/14/23 1431 07/15/23 0859   07/14/23 1400  vancomycin (VANCOREADY) IVPB 1500 mg/300 mL        1,500 mg 150 mL/hr over 120 Minutes Intravenous  Once 07/14/23 1351 07/14/23 1810   07/14/23 1345  ceFEPIme (MAXIPIME) 2 g in sodium chloride 0.9 % 100 mL IVPB        2 g 200 mL/hr over 30 Minutes Intravenous  Once 07/14/23 1332 07/14/23 1435   07/14/23 1345  metroNIDAZOLE (FLAGYL) IVPB 500 mg        500 mg 100 mL/hr over 60 Minutes Intravenous  Once 07/14/23 1332 07/14/23 1517   07/14/23 1345  vancomycin (VANCOCIN) IVPB 1000 mg/200 mL premix  Status:  Discontinued        1,000 mg 200 mL/hr over 60 Minutes Intravenous  Once 07/14/23 1332 07/14/23 1351           Data Reviewed:  I have personally reviewed the following...  CBC: Recent Labs  Lab 07/14/23 1234 07/15/23 0531 07/16/23 0420  WBC 22.0* 16.2* 18.0*  NEUTROABS 20.7*  --   --   HGB 11.1* 9.2* 8.7*  HCT 36.3* 30.4*  30.7*  MCV 94.5 96.5 101.0*  PLT 297 231 170   Basic Metabolic Panel: Recent Labs  Lab 07/14/23 1234 07/15/23 0531 07/15/23 0826 07/16/23 0420  NA 140 140  --  139  K 3.4* 2.9*  --  3.5  CL 101 108  --  108  CO2 23 23  --  24  GLUCOSE 141* 79  --  145*  BUN 34* 29*  --  23  CREATININE 0.66 0.47*  --  0.55*  CALCIUM 9.3 8.7*  --  8.6*  MG  --   --  1.8 2.6*   GFR:  Estimated Creatinine Clearance: 57.8 mL/min (A) (by C-G formula based on SCr of 0.55 mg/dL (L)). Liver Function Tests: Recent Labs  Lab 07/14/23 1234 07/16/23 0420  AST 10* 11*  ALT 8 7  ALKPHOS 57 49  BILITOT 1.2 0.4  PROT 6.7 5.2*  ALBUMIN 2.5* 2.1*   No results for input(s): "LIPASE", "AMYLASE" in the last 168 hours. No results for input(s): "AMMONIA" in the last 168 hours. Coagulation Profile: Recent Labs  Lab 07/14/23 1357  INR 1.3*   Cardiac Enzymes: No results for input(s): "CKTOTAL", "CKMB", "CKMBINDEX", "TROPONINI" in the last 168 hours. BNP (last 3 results) No results for input(s): "PROBNP" in the last 8760 hours. HbA1C: No results for input(s): "HGBA1C" in the last 72 hours. CBG: Recent Labs  Lab 07/14/23 1203  GLUCAP 136*   Lipid Profile: No results for input(s): "CHOL", "HDL", "LDLCALC", "TRIG", "CHOLHDL", "LDLDIRECT" in the last 72 hours. Thyroid Function Tests: No results for input(s): "TSH", "T4TOTAL", "FREET4", "T3FREE", "THYROIDAB" in the last 72 hours. Anemia Panel: No results for input(s): "VITAMINB12", "FOLATE", "FERRITIN", "TIBC", "IRON", "RETICCTPCT" in the last 72 hours. Most Recent Urinalysis On File:     Component Value Date/Time   COLORURINE YELLOW 07/14/2023 1600   APPEARANCEUR HAZY (A) 07/14/2023 1600   LABSPEC 1.026 07/14/2023 1600   PHURINE 5.0 07/14/2023 1600   GLUCOSEU NEGATIVE 07/14/2023 1600   HGBUR SMALL (A) 07/14/2023 1600   BILIRUBINUR NEGATIVE 07/14/2023 1600   BILIRUBINUR negative 04/19/2019 1354   KETONESUR 20 (A) 07/14/2023 1600   PROTEINUR 100  (A) 07/14/2023 1600   UROBILINOGEN 0.2 04/19/2019 1354   UROBILINOGEN 1.0 05/21/2015 1050   NITRITE NEGATIVE 07/14/2023 1600   LEUKOCYTESUR MODERATE (A) 07/14/2023 1600   Sepsis Labs: @LABRCNTIP (procalcitonin:4,lacticidven:4) Microbiology: Recent Results (from the past 240 hour(s))  Culture, blood (Routine x 2)     Status: None (Preliminary result)   Collection Time: 07/14/23  1:15 PM   Specimen: BLOOD  Result Value Ref Range Status   Specimen Description BLOOD BLOOD RIGHT ARM back arm  Final   Special Requests   Final    BOTTLES DRAWN AEROBIC ONLY Blood Culture adequate volume   Culture   Final    NO GROWTH 4 DAYS Performed at [facility], [address], [place], [place] [address]    Report Status PENDING  Incomplete  Culture, blood (Routine x 2)     Status: None (Preliminary result)   Collection Time: 07/14/23  1:57 PM   Specimen: BLOOD  Result Value Ref Range Status   Specimen Description BLOOD BLOOD RIGHT ARM  Final   Special Requests   Final    BOTTLES DRAWN AEROBIC AND ANAEROBIC Blood Culture adequate volume   Culture   Final    NO GROWTH 4 DAYS Performed at [facility], [address], [place], [place] [address]    Report Status PENDING  Incomplete  Resp panel by RT-PCR (RSV, Flu A&B, Covid) Anterior Nasal Swab     Status: None   Collection Time: 07/14/23  2:22 PM   Specimen: Anterior Nasal Swab  Result Value Ref Range Status   SARS Coronavirus 2 by RT PCR NEGATIVE NEGATIVE Final    Comment: (NOTE) SARS-CoV-2 target nucleic acids are NOT DETECTED.  The SARS-CoV-2 RNA is generally detectable in upper respiratory specimens during the acute phase of infection. The lowest concentration of SARS-CoV-2 viral copies this assay can detect is 138 copies/mL. A negative result does not preclude SARS-Cov-2 infection and should not be used as the sole  basis for treatment or other patient management decisions. A negative result may occur with  improper specimen  collection/handling, submission of specimen other than nasopharyngeal swab, presence of viral mutation(s) within the areas targeted by this assay, and inadequate number of viral copies(<138 copies/mL). A negative result must be combined with clinical observations, patient history, and epidemiological information. The expected result is Negative.  Fact Sheet for Patients:  BloggerCourse.com  Fact Sheet for Healthcare Providers:  SeriousBroker.it  This test is no t yet approved or cleared by the [place] FDA and  has been authorized for detection and/or diagnosis of SARS-CoV-2 by FDA under an Emergency Use Authorization (EUA). This EUA will remain  in effect (meaning this test can be used) for the duration of the COVID-19 declaration under Section 564(b)(1) of the Act, 21 U.S.C.section 360bbb-3(b)(1), unless the authorization is terminated  or revoked sooner.       Influenza A by PCR NEGATIVE NEGATIVE Final   Influenza B by PCR NEGATIVE NEGATIVE Final    Comment: (NOTE) The Xpert Xpress SARS-CoV-2/FLU/RSV plus assay is intended as an aid in the diagnosis of influenza from Nasopharyngeal swab specimens and should not be used as a sole basis for treatment. Nasal washings and aspirates are unacceptable for Xpert Xpress SARS-CoV-2/FLU/RSV testing.  Fact Sheet for Patients: BloggerCourse.com  Fact Sheet for Healthcare Providers: SeriousBroker.it  This test is not yet approved or cleared by the [place] FDA and has been authorized for detection and/or diagnosis of SARS-CoV-2 by FDA under an Emergency Use Authorization (EUA). This EUA will remain in effect (meaning this test can be used) for the duration of the COVID-19 declaration under Section 564(b)(1) of the Act, 21 U.S.C. section 360bbb-3(b)(1), unless the authorization is terminated or revoked.     Resp Syncytial  Virus by PCR NEGATIVE NEGATIVE Final    Comment: (NOTE) Fact Sheet for Patients: BloggerCourse.com  Fact Sheet for Healthcare Providers: SeriousBroker.it  This test is not yet approved or cleared by the [place] FDA and has been authorized for detection and/or diagnosis of SARS-CoV-2 by FDA under an Emergency Use Authorization (EUA). This EUA will remain in effect (meaning this test can be used) for the duration of the COVID-19 declaration under Section 564(b)(1) of the Act, 21 U.S.C. section 360bbb-3(b)(1), unless the authorization is terminated or revoked.  Performed at [facility], [address] [place], [place] [address]   Urine Culture     Status: Abnormal   Collection Time: 07/14/23  4:00 PM   Specimen: Urine, Random  Result Value Ref Range Status   Specimen Description   Final    URINE, RANDOM Performed at [facility], [address], [place], [place] [address]    Special Requests   Final    NONE Reflexed from [id number] Performed at [facility], [address], [place], [place] [address]    Culture >=100,000 COLONIES/mL PSEUDOMONAS AERUGINOSA (A)  Final   Report Status 07/16/2023 FINAL  Final   Organism ID, Bacteria PSEUDOMONAS AERUGINOSA (A)  Final      Susceptibility   Pseudomonas aeruginosa - MIC*    CEFTAZIDIME <=1 SENSITIVE Sensitive     CIPROFLOXACIN <=0.25 SENSITIVE Sensitive     GENTAMICIN <=1 SENSITIVE Sensitive     IMIPENEM 1 SENSITIVE Sensitive     PIP/TAZO 8 SENSITIVE Sensitive     CEFEPIME 2 SENSITIVE Sensitive     * >=100,000 COLONIES/mL PSEUDOMONAS AERUGINOSA  MRSA Next Gen by PCR, Nasal     Status: None  Collection Time: 07/15/23  8:45 AM   Specimen: Nasal Mucosa; Nasal Swab  Result Value Ref Range Status   MRSA by PCR Next Gen NOT DETECTED NOT DETECTED Final    Comment: (NOTE) The GeneXpert MRSA Assay (FDA approved for NASAL specimens only), is one component of a comprehensive MRSA  colonization surveillance program. It is not intended to diagnose MRSA infection nor to guide or monitor treatment for MRSA infections. Test performance is not FDA approved in patients less than 58 years old. Performed at [facility], [address] [place], [place] [address]       Radiology Studies last 3 days: DG CHEST PORT 1 VIEW  Result Date: 07/16/2023 CLINICAL DATA:  [id number] SIRS (systemic inflammatory response syndrome) ([name] [id number] Fever, unknown origin [id number] EXAM: PORTABLE CHEST 1 VIEW COMPARISON:  07/15/2023 FINDINGS: Stable cardiomediastinal contours. Aortic atherosclerosis. Small left pleural effusion with left basilar opacity, slightly improved. Mild right basilar atelectasis. No pneumothorax. IMPRESSION: Small left pleural effusion with left basilar opacity, slightly improved. Electronically Signed   By: Duanne Guess D.O.   On: 07/16/2023 10:37   DG CHEST PORT 1 VIEW  Result Date: 07/15/2023 CLINICAL DATA:  [id number] Dyspnea [id number] Sepsis, failure to thrive, dyspnea. EXAM: PORTABLE CHEST 1 VIEW COMPARISON:  Chest x-ray 07/14/2023, CT chest 12/31/2022, CT abdomen pelvis 04/27/2022, chest x-ray 03/25/2023 FINDINGS: The heart and mediastinal contours are unchanged. Aortic calcification. Elevated left hemidiaphragm. Interval increase in left lower lobe and lingular airspace opacity. Interval increase in size of an at least small left pleural effusion. No right pleural effusion. Chronic coarsened markings with no overt pulmonary edema. No pleural effusion. No pneumothorax. No acute osseous abnormality. Chronic 4 mm retained metallic density overlying the left upper abdomen. IMPRESSION: 1. Interval increase in left lower lobe and lingular airspace opacity. Interval increase in size of an at least small left pleural effusion. 2. Elevated left hemidiaphragm. 3.  Aortic Atherosclerosis (ICD10-I70.0). Electronically Signed   By: Tish Frederickson M.D.   On: 07/15/2023 20:59              LOS: 4 days       Sunnie Nielsen, DO [facility] 07/18/2023, 3:26 PM    Dictation software may have been used to generate the above note. Typos may occur and escape review in typed/dictated notes. Please contact Dr Lyn Hollingshead directly for clarity if needed.  Staff may message me via secure chat in Epic  but this may not receive an immediate response,  please page me for urgent matters!  If 7PM-7AM, please contact night coverage [email or web address]

## 2023-07-19 DIAGNOSIS — A419 Sepsis, unspecified organism: Secondary | ICD-10-CM | POA: Diagnosis not present

## 2023-07-19 NOTE — TOC Transition Note (Addendum)
Transition of Care [facility]) - CM/SW Discharge Note   Patient Details  Name: Jared Bridges MRN: [id number] Date of Birth: Mar 26, 1930  Transition of Care (TOC) CM/SW Contact:  Catalina Gravel, LCSW Phone Number: 07/19/2023, 11:33 AM   Clinical Narrative:    CSW spoke with Kendal Hymen, nurse [facility]. Pt will be able to DC to [facility] today and she has spoken to family as well and in communication with RN at [facility].   Addendum: CSW spoke with adult daughter, very appreciative of [facility] care. Let her know that transportation will be today for [employer]. Called [facility] to place on transport list.No further TOC needs at this time.   Final next level of care: Home w Home Health Services Barriers to Discharge: Continued Medical Work up   Patient Goals and CMS Choice      Discharge Placement                         Discharge Plan and Services Additional resources added to the After Visit Summary for                                       Social Determinants of Health (SDOH) Interventions SDOH Screenings   Food Insecurity: Patient Unable To Answer (07/14/2023)  Housing: High Risk (07/14/2023)  Transportation Needs: Patient Unable To Answer (07/14/2023)  Utilities: Patient Unable To Answer (07/14/2023)  Depression (PHQ2-9): Low Risk  (01/03/2021)  Tobacco Use: Medium Risk (07/15/2023)     Readmission Risk Interventions    06/03/2023    8:15 AM 03/26/2023   10:59 AM 01/02/2023   12:22 PM  Readmission Risk Prevention Plan  Transportation Screening Complete Complete Complete  Medication Review Oceanographer) Complete Complete Complete  HRI or Home Care Consult Complete Complete Complete  SW Recovery Care/Counseling Consult Complete  Complete  Palliative Care Screening Not Applicable Not Applicable Not Applicable  Skilled Nursing Facility Not Applicable Not Applicable Not Applicable

## 2023-07-19 NOTE — Discharge Summary (Signed)
Physician Discharge Summary   Patient: Jared Bridges MRN: [id number]  DOB: [birth year]   Admit:     Date of Admission: 07/14/2023 Admitted from: home   Discharge: Date of discharge: 07/19/23 Disposition: Hospice care Condition at discharge: stable  CODE STATUS: DNR     Discharge Physician: Sunnie Nielsen, DO [facility]     PCP: Alden Benjamin, MD  Recommendations for Outpatient Follow-up:  Per hospice        Discharge Diagnoses: Principal Problem:   Sepsis [facility])       Hospital Course: 89 y.o. 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  Had not eaten for the last 4 days and has stopped talking the last 2 days.  She has noticed a cough and also that his urine is darker brown. 08/13: On admission, patient met sepsis criteria with a temperature of 100.7, blood pressure 90/50, respiratory rate of 32 and tachycardia.  He does not wear oxygen normally but required 2.5 L nasal cannula to have O2 sats of 100%.  His workup in the emergency department revealed an elevated white blood cell count of 22.  Chest x-ray revealed only a chronic left lower lobe atelectasis.  Covid negative. He did receive sepsis level fluids and empiric vancomycin, cefepime, and Flagyl.   08/14-08/16: Patient was found to have sepsis secondary to Pseudomonas aeruginosa UTI, received antibiotic appropriately but continued to deteriorate. Palliative care was consulted and patient was transitioned to comfort care.  08/17: [facility]. 08/18: ok for hospice transfer today    Consultants:  Palliative Care   Procedures: none      ASSESSMENT & PLAN:   Principal Problem:   Sepsis ([name]   Sepsis from pseudomonas  aeruginosa UTI  Patient was initially received broad-spectrum antibiotics followed by cefepime based on sensitivity results. Later family withdraw the active management and transition him to comfort care. confirmed hospice facility placement   Severe protein calorie malnutrition / Adult Failure to thrive  Patient has been transitioned to comfort care now   Prostate cancer prognosis very guarded with high mortality risk  Patient is comfort care only now   Hypokalemia Lactic acidosis Resolved No labs on comfort measures    Conjunctivitis right eye polytrim eye drops ordered with clinical improvement seen              Discharge Instructions  Allergies as of 07/19/2023       Reactions   Iodine Hives, Itching, Other (See Comments)   very allergic(per daughter), can pre-med with benadryl   Calan [verapamil] Other (See Comments)   Weakness   Cymbalta [duloxetine Hcl] Other (See Comments)   Makes patient have jerking motions.   Iodinated Contrast Media Hives, Itching, Other (See Comments)   Can pre-med with benadryl   Latex Other (See Comments)   Redness iritation   Lisinopril Other (See Comments)   Makes patient have jerking motions.   Neurontin [gabapentin] Other (See Comments)   Makes patient have jerking motions.   Zoloft [sertraline Hcl] Diarrhea, Other (See Comments)   Makes patient have jerking motions.   Valium [diazepam] Other (See Comments)   "Did not work"   Flomax [tamsulosin Hcl] Other (See Comments)   Dizziness   Glimepiride Other (See Comments)   "  Did not work"   Melatonin Nausea Only   Tape Other (See Comments)   Skin irritation        Medication List     STOP taking these medications    albuterol 108 (90 Base) MCG/ACT inhaler Commonly known as: VENTOLIN HFA   alfuzosin 10 MG 24 hr tablet Commonly known as: UROXATRAL   atorvastatin 40 MG tablet Commonly known as: LIPITOR   Calcium 200 MG Tabs   cyanocobalamin 100 MCG tablet    escitalopram 20 MG tablet Commonly known as: LEXAPRO   ferrous sulfate 325 (65 FE) MG tablet   finasteride 5 MG tablet Commonly known as: PROSCAR   furosemide 20 MG tablet Commonly known as: Lasix   loperamide 2 MG capsule Commonly known as: IMODIUM   mirtazapine 15 MG tablet Commonly known as: REMERON   Otezla 30 MG Tabs Generic drug: Apremilast   pantoprazole 40 MG tablet Commonly known as: Protonix   pregabalin 75 MG capsule Commonly known as: LYRICA   topiramate 50 MG tablet Commonly known as: TOPAMAX   vitamin C 250 MG tablet Commonly known as: ASCORBIC ACID   Vitamin D3 1000 units Caps   Xtandi 80 MG tablet Generic drug: enzalutamide   zolpidem 5 MG tablet Commonly known as: AMBIEN          Allergies  Allergen Reactions   Iodine Hives, Itching and Other (See Comments)    very allergic(per daughter), can pre-med with benadryl   Calan [Verapamil] Other (See Comments)    Weakness   Cymbalta [Duloxetine Hcl] Other (See Comments)    Makes patient have jerking motions.   Iodinated Contrast Media Hives, Itching and Other (See Comments)    Can pre-med with benadryl   Latex Other (See Comments)    Redness iritation   Lisinopril Other (See Comments)    Makes patient have jerking motions.   Neurontin [Gabapentin] Other (See Comments)    Makes patient have jerking motions.   Zoloft [Sertraline Hcl] Diarrhea and Other (See Comments)    Makes patient have jerking motions.   Valium [Diazepam] Other (See Comments)    "Did not work"   Flomax [Tamsulosin Hcl] Other (See Comments)    Dizziness   Glimepiride Other (See Comments)    "Did not work"   Melatonin Nausea Only   Tape Other (See Comments)    Skin irritation     Subjective: unable to assess   Discharge Exam: BP 138/72 (BP Location: Right Arm)   Pulse 70   Temp 97.8 F (36.6 C)   Resp 14   Wt 68 kg   SpO2 100%   BMI 19.78 kg/m  General: Pt is resting comfortably not in acute  distress Cardiovascular: good perfusion Respiratory: no respiratory distress Abdominal: Soft, NT, ND Extremities: no edema     The results of significant diagnostics from this hospitalization (including imaging, microbiology, ancillary and laboratory) are listed below for reference.     Microbiology: Recent Results (from the past 240 hour(s))  Culture, blood (Routine x 2)     Status: None   Collection Time: 07/14/23  1:15 PM   Specimen: BLOOD  Result Value Ref Range Status   Specimen Description BLOOD BLOOD RIGHT ARM back arm  Final   Special Requests   Final    BOTTLES DRAWN AEROBIC ONLY Blood Culture adequate volume   Culture   Final    NO GROWTH 5 DAYS Performed at [facility], [address] [place], [place] [address]  Report Status 07/19/2023 FINAL  Final  Culture, blood (Routine x 2)     Status: None   Collection Time: 07/14/23  1:57 PM   Specimen: BLOOD  Result Value Ref Range Status   Specimen Description BLOOD BLOOD RIGHT ARM  Final   Special Requests   Final    BOTTLES DRAWN AEROBIC AND ANAEROBIC Blood Culture adequate volume   Culture   Final    NO GROWTH 5 DAYS Performed at [facility], [address], [place], [place] [address]    Report Status 07/19/2023 FINAL  Final  Resp panel by RT-PCR (RSV, Flu A&B, Covid) Anterior Nasal Swab     Status: None   Collection Time: 07/14/23  2:22 PM   Specimen: Anterior Nasal Swab  Result Value Ref Range Status   SARS Coronavirus 2 by RT PCR NEGATIVE NEGATIVE Final    Comment: (NOTE) SARS-CoV-2 target nucleic acids are NOT DETECTED.  The SARS-CoV-2 RNA is generally detectable in upper respiratory specimens during the acute phase of infection. The lowest concentration of SARS-CoV-2 viral copies this assay can detect is 138 copies/mL. A negative result does not preclude SARS-Cov-2 infection and should not be used as the sole basis for treatment or other patient management decisions. A negative result may occur with   improper specimen collection/handling, submission of specimen other than nasopharyngeal swab, presence of viral mutation(s) within the areas targeted by this assay, and inadequate number of viral copies(<138 copies/mL). A negative result must be combined with clinical observations, patient history, and epidemiological information. The expected result is Negative.  Fact Sheet for Patients:  BloggerCourse.com  Fact Sheet for Healthcare Providers:  SeriousBroker.it  This test is no t yet approved or cleared by the [place] FDA and  has been authorized for detection and/or diagnosis of SARS-CoV-2 by FDA under an Emergency Use Authorization (EUA). This EUA will remain  in effect (meaning this test can be used) for the duration of the COVID-19 declaration under Section 564(b)(1) of the Act, 21 U.S.C.section 360bbb-3(b)(1), unless the authorization is terminated  or revoked sooner.       Influenza A by PCR NEGATIVE NEGATIVE Final   Influenza B by PCR NEGATIVE NEGATIVE Final    Comment: (NOTE) The Xpert Xpress SARS-CoV-2/FLU/RSV plus assay is intended as an aid in the diagnosis of influenza from Nasopharyngeal swab specimens and should not be used as a sole basis for treatment. Nasal washings and aspirates are unacceptable for Xpert Xpress SARS-CoV-2/FLU/RSV testing.  Fact Sheet for Patients: BloggerCourse.com  Fact Sheet for Healthcare Providers: SeriousBroker.it  This test is not yet approved or cleared by the [place] FDA and has been authorized for detection and/or diagnosis of SARS-CoV-2 by FDA under an Emergency Use Authorization (EUA). This EUA will remain in effect (meaning this test can be used) for the duration of the COVID-19 declaration under Section 564(b)(1) of the Act, 21 U.S.C. section 360bbb-3(b)(1), unless the authorization is terminated or revoked.      Resp Syncytial Virus by PCR NEGATIVE NEGATIVE Final    Comment: (NOTE) Fact Sheet for Patients: BloggerCourse.com  Fact Sheet for Healthcare Providers: SeriousBroker.it  This test is not yet approved or cleared by the [place] FDA and has been authorized for detection and/or diagnosis of SARS-CoV-2 by FDA under an Emergency Use Authorization (EUA). This EUA will remain in effect (meaning this test can be used) for the duration of the COVID-19 declaration under Section 564(b)(1) of the Act, 21 U.S.C. section 360bbb-3(b)(1), unless the authorization  is terminated or revoked.  Performed at [facility], [address], [place], [place] [address]   Urine Culture     Status: Abnormal   Collection Time: 07/14/23  4:00 PM   Specimen: Urine, Random  Result Value Ref Range Status   Specimen Description   Final    URINE, RANDOM Performed at [facility], [address] [place], [place] [address]    Special Requests   Final    NONE Reflexed from [id number] Performed at [facility], [address], [place], [place] [address]    Culture >=100,000 COLONIES/mL PSEUDOMONAS AERUGINOSA (A)  Final   Report Status 07/16/2023 FINAL  Final   Organism ID, Bacteria PSEUDOMONAS AERUGINOSA (A)  Final      Susceptibility   Pseudomonas aeruginosa - MIC*    CEFTAZIDIME <=1 SENSITIVE Sensitive     CIPROFLOXACIN <=0.25 SENSITIVE Sensitive     GENTAMICIN <=1 SENSITIVE Sensitive     IMIPENEM 1 SENSITIVE Sensitive     PIP/TAZO 8 SENSITIVE Sensitive     CEFEPIME 2 SENSITIVE Sensitive     * >=100,000 COLONIES/mL PSEUDOMONAS AERUGINOSA  MRSA Next Gen by PCR, Nasal     Status: None   Collection Time: 07/15/23  8:45 AM   Specimen: Nasal Mucosa; Nasal Swab  Result Value Ref Range Status   MRSA by PCR Next Gen NOT DETECTED NOT DETECTED Final    Comment: (NOTE) The GeneXpert MRSA Assay (FDA approved for NASAL specimens only), is one component of a  comprehensive MRSA colonization surveillance program. It is not intended to diagnose MRSA infection nor to guide or monitor treatment for MRSA infections. Test performance is not FDA approved in patients less than 75 years old. Performed at [facility], [address] [place], [place] [address]      Labs: BNP (last 3 results) Recent Labs    08/08/22 1532 08/10/22 0512  BNP 96.0 131.0*   Basic Metabolic Panel: Recent Labs  Lab 07/14/23 1234 07/15/23 0531 07/15/23 0826 07/16/23 0420  NA 140 140  --  139  K 3.4* 2.9*  --  3.5  CL 101 108  --  108  CO2 23 23  --  24  GLUCOSE 141* 79  --  145*  BUN 34* 29*  --  23  CREATININE 0.66 0.47*  --  0.55*  CALCIUM 9.3 8.7*  --  8.6*  MG  --   --  1.8 2.6*   Liver Function Tests: Recent Labs  Lab 07/14/23 1234 07/16/23 0420  AST 10* 11*  ALT 8 7  ALKPHOS 57 49  BILITOT 1.2 0.4  PROT 6.7 5.2*  ALBUMIN 2.5* 2.1*   No results for input(s): "LIPASE", "AMYLASE" in the last 168 hours. No results for input(s): "AMMONIA" in the last 168 hours. CBC: Recent Labs  Lab 07/14/23 1234 07/15/23 0531 07/16/23 0420  WBC 22.0* 16.2* 18.0*  NEUTROABS 20.7*  --   --   HGB 11.1* 9.2* 8.7*  HCT 36.3* 30.4* 30.7*  MCV 94.5 96.5 101.0*  PLT 297 231 170   Cardiac Enzymes: No results for input(s): "CKTOTAL", "CKMB", "CKMBINDEX", "TROPONINI" in the last 168 hours. BNP: Invalid input(s): "POCBNP" CBG: Recent Labs  Lab 07/14/23 1203  GLUCAP 136*   D-Dimer No results for input(s): "DDIMER" in the last 72 hours. Hgb A1c No results for input(s): "HGBA1C" in the last 72 hours. Lipid Profile No results for input(s): "CHOL", "HDL", "LDLCALC", "TRIG", "CHOLHDL", "LDLDIRECT" in the last 72 hours. Thyroid function studies No  results for input(s): "TSH", "T4TOTAL", "T3FREE", "THYROIDAB" in the last 72 hours.  Invalid input(s): "FREET3" Anemia work up No results for input(s): "VITAMINB12", "FOLATE", "FERRITIN", "TIBC", "IRON",  "RETICCTPCT" in the last 72 hours. Urinalysis    Component Value Date/Time   COLORURINE YELLOW 07/14/2023 1600   APPEARANCEUR HAZY (A) 07/14/2023 1600   LABSPEC 1.026 07/14/2023 1600   PHURINE 5.0 07/14/2023 1600   GLUCOSEU NEGATIVE 07/14/2023 1600   HGBUR SMALL (A) 07/14/2023 1600   BILIRUBINUR NEGATIVE 07/14/2023 1600   BILIRUBINUR negative 04/19/2019 1354   KETONESUR 20 (A) 07/14/2023 1600   PROTEINUR 100 (A) 07/14/2023 1600   UROBILINOGEN 0.2 04/19/2019 1354   UROBILINOGEN 1.0 05/21/2015 1050   NITRITE NEGATIVE 07/14/2023 1600   LEUKOCYTESUR MODERATE (A) 07/14/2023 1600   Sepsis Labs Recent Labs  Lab 07/14/23 1234 07/15/23 0531 07/16/23 0420  WBC 22.0* 16.2* 18.0*   Microbiology Recent Results (from the past 240 hour(s))  Culture, blood (Routine x 2)     Status: None   Collection Time: 07/14/23  1:15 PM   Specimen: BLOOD  Result Value Ref Range Status   Specimen Description BLOOD BLOOD RIGHT ARM back arm  Final   Special Requests   Final    BOTTLES DRAWN AEROBIC ONLY Blood Culture adequate volume   Culture   Final    NO GROWTH 5 DAYS Performed at [facility], [address], [place], [place] [address]    Report Status 07/19/2023 FINAL  Final  Culture, blood (Routine x 2)     Status: None   Collection Time: 07/14/23  1:57 PM   Specimen: BLOOD  Result Value Ref Range Status   Specimen Description BLOOD BLOOD RIGHT ARM  Final   Special Requests   Final    BOTTLES DRAWN AEROBIC AND ANAEROBIC Blood Culture adequate volume   Culture   Final    NO GROWTH 5 DAYS Performed at [facility], [address], [place], [place] [address]    Report Status 07/19/2023 FINAL  Final  Resp panel by RT-PCR (RSV, Flu A&B, Covid) Anterior Nasal Swab     Status: None   Collection Time: 07/14/23  2:22 PM   Specimen: Anterior Nasal Swab  Result Value Ref Range Status   SARS Coronavirus 2 by RT PCR NEGATIVE NEGATIVE Final    Comment: (NOTE) SARS-CoV-2 target nucleic acids are NOT  DETECTED.  The SARS-CoV-2 RNA is generally detectable in upper respiratory specimens during the acute phase of infection. The lowest concentration of SARS-CoV-2 viral copies this assay can detect is 138 copies/mL. A negative result does not preclude SARS-Cov-2 infection and should not be used as the sole basis for treatment or other patient management decisions. A negative result may occur with  improper specimen collection/handling, submission of specimen other than nasopharyngeal swab, presence of viral mutation(s) within the areas targeted by this assay, and inadequate number of viral copies(<138 copies/mL). A negative result must be combined with clinical observations, patient history, and epidemiological information. The expected result is Negative.  Fact Sheet for Patients:  BloggerCourse.com  Fact Sheet for Healthcare Providers:  SeriousBroker.it  This test is no t yet approved or cleared by the [place] FDA and  has been authorized for detection and/or diagnosis of SARS-CoV-2 by FDA under an Emergency Use Authorization (EUA). This EUA will remain  in effect (meaning this test can be used) for the duration of the COVID-19 declaration under Section 564(b)(1) of the Act, 21 U.S.C.section 360bbb-3(b)(1), unless the authorization is terminated  or revoked sooner.       Influenza A by PCR NEGATIVE NEGATIVE Final   Influenza B by PCR NEGATIVE NEGATIVE Final    Comment: (NOTE) The Xpert Xpress SARS-CoV-2/FLU/RSV plus assay is intended as an aid in the diagnosis of influenza from Nasopharyngeal swab specimens and should not be used as a sole basis for treatment. Nasal washings and aspirates are unacceptable for Xpert Xpress SARS-CoV-2/FLU/RSV testing.  Fact Sheet for Patients: BloggerCourse.com  Fact Sheet for Healthcare Providers: SeriousBroker.it  This test is not yet  approved or cleared by the [place] FDA and has been authorized for detection and/or diagnosis of SARS-CoV-2 by FDA under an Emergency Use Authorization (EUA). This EUA will remain in effect (meaning this test can be used) for the duration of the COVID-19 declaration under Section 564(b)(1) of the Act, 21 U.S.C. section 360bbb-3(b)(1), unless the authorization is terminated or revoked.     Resp Syncytial Virus by PCR NEGATIVE NEGATIVE Final    Comment: (NOTE) Fact Sheet for Patients: BloggerCourse.com  Fact Sheet for Healthcare Providers: SeriousBroker.it  This test is not yet approved or cleared by the [place] FDA and has been authorized for detection and/or diagnosis of SARS-CoV-2 by FDA under an Emergency Use Authorization (EUA). This EUA will remain in effect (meaning this test can be used) for the duration of the COVID-19 declaration under Section 564(b)(1) of the Act, 21 U.S.C. section 360bbb-3(b)(1), unless the authorization is terminated or revoked.  Performed at [facility], [address] [place], [place] [address]   Urine Culture     Status: Abnormal   Collection Time: 07/14/23  4:00 PM   Specimen: Urine, Random  Result Value Ref Range Status   Specimen Description   Final    URINE, RANDOM Performed at [facility], [address] [place], [place] [address]    Special Requests   Final    NONE Reflexed from [phone] [id number] Performed at [facility], [address] [place], [place] [address]    Culture >=100,000 COLONIES/mL PSEUDOMONAS AERUGINOSA (A)  Final   Report Status 07/16/2023 FINAL  Final   Organism ID, Bacteria PSEUDOMONAS AERUGINOSA (A)  Final      Susceptibility   Pseudomonas aeruginosa - MIC*    CEFTAZIDIME <=1 SENSITIVE Sensitive     CIPROFLOXACIN <=0.25 SENSITIVE Sensitive     GENTAMICIN <=1 SENSITIVE Sensitive     IMIPENEM 1 SENSITIVE Sensitive     PIP/TAZO 8 SENSITIVE Sensitive     CEFEPIME 2  SENSITIVE Sensitive     * >=100,000 COLONIES/mL PSEUDOMONAS AERUGINOSA  MRSA Next Gen by PCR, Nasal     Status: None   Collection Time: 07/15/23  8:45 AM   Specimen: Nasal Mucosa; Nasal Swab  Result Value Ref Range Status   MRSA by PCR Next Gen NOT DETECTED NOT DETECTED Final    Comment: (NOTE) The GeneXpert MRSA Assay (FDA approved for NASAL specimens only), is one component of a comprehensive MRSA colonization surveillance program. It is not intended to diagnose MRSA infection nor to guide or monitor treatment for MRSA infections. Test performance is not FDA approved in patients less than 29 years old. Performed at [facility], [address], [place], [place] [address]    Imaging DG CHEST PORT 1 VIEW  Result Date: 07/15/2023 CLINICAL DATA:  [id number] Dyspnea [id number] Sepsis, failure to thrive, dyspnea. EXAM: PORTABLE CHEST 1 VIEW COMPARISON:  Chest x-ray 07/14/2023, CT chest 12/31/2022, CT abdomen pelvis 04/27/2022, chest x-ray 03/25/2023 FINDINGS: The heart and mediastinal  contours are unchanged. Aortic calcification. Elevated left hemidiaphragm. Interval increase in left lower lobe and lingular airspace opacity. Interval increase in size of an at least small left pleural effusion. No right pleural effusion. Chronic coarsened markings with no overt pulmonary edema. No pleural effusion. No pneumothorax. No acute osseous abnormality. Chronic 4 mm retained metallic density overlying the left upper abdomen. IMPRESSION: 1. Interval increase in left lower lobe and lingular airspace opacity. Interval increase in size of an at least small left pleural effusion. 2. Elevated left hemidiaphragm. 3.  Aortic Atherosclerosis (ICD10-I70.0). Electronically Signed   By: Tish Frederickson M.D.   On: 07/15/2023 20:59   CT Head Wo Contrast  Result Date: 07/14/2023 CLINICAL DATA:  Altered mental status EXAM: CT HEAD WITHOUT CONTRAST TECHNIQUE: Contiguous axial images were obtained from the base of the skull through  the vertex without intravenous contrast. RADIATION DOSE REDUCTION: This exam was performed according to the departmental dose-optimization program which includes automated exposure control, adjustment of the mA and/or kV according to patient size and/or use of iterative reconstruction technique. COMPARISON:  06/23/2023 FINDINGS: Brain: There is patient motion in some of the images. Additional images were repeated. There are no signs of bleeding within the cranium. There is decreased density in periventricular and subcortical white matter. Cortical sulci are prominent. Vascular: Unremarkable. Skull: No fracture is seen in calvarium. Sinuses/Orbits: There is mild mucosal thickening in the right sphenoid sinus. Other: No significant interval changes are noted. IMPRESSION: No acute intracranial findings are seen. Atrophy. Small-vessel disease. Mild chronic sphenoid sinusitis. Electronically Signed   By: Ernie Avena M.D.   On: 07/14/2023 15:48   DG Chest Port 1 View  Result Date: 07/14/2023 CLINICAL DATA:  Sepsis. Difficulty breathing with decreased oral intake for 4 days. EXAM: PORTABLE CHEST 1 VIEW COMPARISON:  Radiographs 06/23/2023 and 06/01/2023.  CT 12/31/2022. FINDINGS: 1327 hours. Two views obtained. The heart size and mediastinal contours are stable with aortic atherosclerosis. There is chronic left lower lobe airspace disease with a small left pleural effusion, progressive from the most recent study. The right lung appears clear. No evidence of pneumothorax. No acute osseous findings are seen. There are mild degenerative changes in the spine. Surgical clips are noted within the upper abdomen. IMPRESSION: Progressive chronic left lower lobe airspace disease with associated small left pleural effusion on prior CT. Findings may be secondary to chronic aspiration. Correlate clinically. Atypical malignancy considered less likely. Electronically Signed   By: Carey Bullocks M.D.   On: 07/14/2023 15:06       Time coordinating discharge: over 30 minutes  SIGNED:  Sunnie Nielsen DO [facility]

## 2023-07-19 NOTE — Progress Notes (Signed)
Report was called to [employer] house nurse [place].

## 2023-07-23 NOTE — Progress Notes (Signed)
   07/16/23 1409  Spiritual Encounters  Type of Visit Initial  Care provided to: Patient  Conversation partners present during encounter Nurse  Referral source IDT Rounds  Reason for visit End-of-life  OnCall Visit No  Spiritual Framework  Presenting Themes Meaning/purpose/sources of inspiration;Courage hope and growth;Significant life change  Community/Connection Family;Friend(s);Faith community  Patient Stress Factors None identified  Family Stress Factors None identified  Interventions  Spiritual Care Interventions Made Compassionate presence;Prayer;Meaning making  Intervention Outcomes  Outcomes Connection to spiritual care;Awareness of support  Spiritual Care Plan  Spiritual Care Issues Still Outstanding Chaplain will continue to follow   Found Jared Bridges laying in hospital bed sleeping quietly. Chaplain sat bedside and provided compassionate presence and quiet prayer. No family bedside at present. Will remain available in order to provide spiritual support and to assess for spiritual need.   Rev. Jolyn Lent, M.Div Chaplain

## 2023-07-28 ENCOUNTER — Encounter (INDEPENDENT_AMBULATORY_CARE_PROVIDER_SITE_OTHER): Payer: Self-pay

## 2023-08-02 DEATH — deceased

## 2023-08-24 ENCOUNTER — Ambulatory Visit (INDEPENDENT_AMBULATORY_CARE_PROVIDER_SITE_OTHER): Payer: No Typology Code available for payment source | Admitting: Gastroenterology

## 2023-09-01 DEATH — deceased
